# Patient Record
Sex: Male | Born: 1935 | Race: White | Hispanic: No | Marital: Married | Smoking: Former smoker
Health system: Southern US, Community
[De-identification: ages and names within clinical notes are randomized; demographics above are authoritative.]

## PROBLEM LIST (undated history)

## (undated) DIAGNOSIS — Z8546 Personal history of malignant neoplasm of prostate: Secondary | ICD-10-CM

## (undated) DIAGNOSIS — Z9989 Dependence on other enabling machines and devices: Secondary | ICD-10-CM

## (undated) DIAGNOSIS — I4819 Other persistent atrial fibrillation: Secondary | ICD-10-CM

## (undated) DIAGNOSIS — Z8711 Personal history of peptic ulcer disease: Secondary | ICD-10-CM

## (undated) DIAGNOSIS — I119 Hypertensive heart disease without heart failure: Secondary | ICD-10-CM

## (undated) DIAGNOSIS — E669 Obesity, unspecified: Secondary | ICD-10-CM

## (undated) DIAGNOSIS — K219 Gastro-esophageal reflux disease without esophagitis: Secondary | ICD-10-CM

## (undated) DIAGNOSIS — N183 Chronic kidney disease, stage 3 unspecified: Secondary | ICD-10-CM

## (undated) DIAGNOSIS — Z86718 Personal history of other venous thrombosis and embolism: Secondary | ICD-10-CM

## (undated) DIAGNOSIS — Z87442 Personal history of urinary calculi: Secondary | ICD-10-CM

## (undated) DIAGNOSIS — I350 Nonrheumatic aortic (valve) stenosis: Secondary | ICD-10-CM

## (undated) DIAGNOSIS — I2510 Atherosclerotic heart disease of native coronary artery without angina pectoris: Secondary | ICD-10-CM

## (undated) DIAGNOSIS — M1990 Unspecified osteoarthritis, unspecified site: Secondary | ICD-10-CM

## (undated) DIAGNOSIS — R32 Unspecified urinary incontinence: Secondary | ICD-10-CM

## (undated) DIAGNOSIS — Z950 Presence of cardiac pacemaker: Secondary | ICD-10-CM

## (undated) DIAGNOSIS — E782 Mixed hyperlipidemia: Secondary | ICD-10-CM

## (undated) DIAGNOSIS — J4540 Moderate persistent asthma, uncomplicated: Secondary | ICD-10-CM

## (undated) DIAGNOSIS — Z7901 Long term (current) use of anticoagulants: Secondary | ICD-10-CM

## (undated) DIAGNOSIS — G4733 Obstructive sleep apnea (adult) (pediatric): Secondary | ICD-10-CM

## (undated) DIAGNOSIS — C61 Malignant neoplasm of prostate: Secondary | ICD-10-CM

## (undated) DIAGNOSIS — T8450XA Infection and inflammatory reaction due to unspecified internal joint prosthesis, initial encounter: Secondary | ICD-10-CM

## (undated) HISTORY — DX: Long term (current) use of anticoagulants: Z79.01

## (undated) HISTORY — DX: Infection and inflammatory reaction due to unspecified internal joint prosthesis, initial encounter: T84.50XA

## (undated) HISTORY — DX: Atherosclerotic heart disease of native coronary artery without angina pectoris: I25.10

## (undated) HISTORY — DX: Presence of cardiac pacemaker: Z95.0

## (undated) HISTORY — DX: Hypertensive heart disease without heart failure: I11.9

## (undated) HISTORY — PX: SKIN BIOPSY: SHX1

## (undated) HISTORY — PX: INGUINAL HERNIA REPAIR: SUR1180

## (undated) HISTORY — PX: VENA CAVA FILTER PLACEMENT: SUR1032

## (undated) HISTORY — DX: Personal history of other venous thrombosis and embolism: Z86.718

## (undated) HISTORY — DX: Nonrheumatic aortic (valve) stenosis: I35.0

## (undated) HISTORY — PX: CATARACT EXTRACTION W/ INTRAOCULAR LENS  IMPLANT, BILATERAL: SHX1307

## (undated) HISTORY — PX: EXCISIONAL HEMORRHOIDECTOMY: SHX1541

## (undated) HISTORY — PX: APPENDECTOMY: SHX54

## (undated) HISTORY — PX: JOINT REPLACEMENT: SHX530

## (undated) HISTORY — DX: Malignant neoplasm of prostate: C61

## (undated) HISTORY — DX: Obesity, unspecified: E66.9

## (undated) HISTORY — PX: REPLACEMENT TOTAL KNEE: SUR1224

## (undated) HISTORY — PX: OTHER SURGICAL HISTORY: SHX169

## (undated) HISTORY — DX: Moderate persistent asthma, uncomplicated: J45.40

## (undated) HISTORY — DX: Gastro-esophageal reflux disease without esophagitis: K21.9

## (undated) HISTORY — DX: Mixed hyperlipidemia: E78.2

## (undated) HISTORY — DX: Personal history of malignant neoplasm of prostate: Z85.46

---

## 1998-11-22 ENCOUNTER — Encounter: Admission: RE | Admit: 1998-11-22 | Discharge: 1998-11-22 | Payer: Self-pay | Admitting: Specialist

## 2000-06-04 ENCOUNTER — Encounter: Admission: RE | Admit: 2000-06-04 | Discharge: 2000-06-04 | Payer: Self-pay | Admitting: Internal Medicine

## 2000-06-04 ENCOUNTER — Encounter: Payer: Self-pay | Admitting: Internal Medicine

## 2000-07-30 ENCOUNTER — Encounter (INDEPENDENT_AMBULATORY_CARE_PROVIDER_SITE_OTHER): Payer: Self-pay

## 2000-07-30 ENCOUNTER — Ambulatory Visit (HOSPITAL_COMMUNITY): Admission: RE | Admit: 2000-07-30 | Discharge: 2000-07-30 | Payer: Self-pay | Admitting: *Deleted

## 2003-11-06 ENCOUNTER — Ambulatory Visit (HOSPITAL_COMMUNITY): Admission: RE | Admit: 2003-11-06 | Discharge: 2003-11-06 | Payer: Self-pay | Admitting: Family Medicine

## 2004-04-25 ENCOUNTER — Encounter (INDEPENDENT_AMBULATORY_CARE_PROVIDER_SITE_OTHER): Payer: Self-pay | Admitting: *Deleted

## 2004-04-25 ENCOUNTER — Inpatient Hospital Stay (HOSPITAL_COMMUNITY): Admission: RE | Admit: 2004-04-25 | Discharge: 2004-04-29 | Payer: Self-pay | Admitting: Urology

## 2004-04-25 HISTORY — PX: PROSTATECTOMY: SHX69

## 2004-05-04 ENCOUNTER — Ambulatory Visit (HOSPITAL_COMMUNITY): Admission: RE | Admit: 2004-05-04 | Discharge: 2004-05-04 | Payer: Self-pay | Admitting: Urology

## 2004-05-04 ENCOUNTER — Inpatient Hospital Stay (HOSPITAL_COMMUNITY): Admission: EM | Admit: 2004-05-04 | Discharge: 2004-05-07 | Payer: Self-pay | Admitting: Emergency Medicine

## 2005-06-12 ENCOUNTER — Inpatient Hospital Stay (HOSPITAL_COMMUNITY): Admission: RE | Admit: 2005-06-12 | Discharge: 2005-06-15 | Payer: Self-pay | Admitting: Orthopedic Surgery

## 2005-11-06 ENCOUNTER — Ambulatory Visit (HOSPITAL_COMMUNITY): Admission: RE | Admit: 2005-11-06 | Discharge: 2005-11-07 | Payer: Self-pay | Admitting: Urology

## 2007-03-18 ENCOUNTER — Inpatient Hospital Stay (HOSPITAL_COMMUNITY): Admission: RE | Admit: 2007-03-18 | Discharge: 2007-03-21 | Payer: Self-pay | Admitting: Orthopedic Surgery

## 2009-02-08 ENCOUNTER — Encounter: Admission: RE | Admit: 2009-02-08 | Discharge: 2009-02-08 | Payer: Self-pay | Admitting: General Surgery

## 2009-02-11 ENCOUNTER — Ambulatory Visit (HOSPITAL_BASED_OUTPATIENT_CLINIC_OR_DEPARTMENT_OTHER): Admission: RE | Admit: 2009-02-11 | Discharge: 2009-02-11 | Payer: Self-pay | Admitting: General Surgery

## 2009-02-16 ENCOUNTER — Encounter (INDEPENDENT_AMBULATORY_CARE_PROVIDER_SITE_OTHER): Payer: Self-pay | Admitting: Urology

## 2009-02-16 ENCOUNTER — Ambulatory Visit: Payer: Self-pay | Admitting: Surgery

## 2009-02-16 ENCOUNTER — Ambulatory Visit: Admission: RE | Admit: 2009-02-16 | Discharge: 2009-02-16 | Payer: Self-pay | Admitting: Urology

## 2009-08-23 ENCOUNTER — Ambulatory Visit (HOSPITAL_BASED_OUTPATIENT_CLINIC_OR_DEPARTMENT_OTHER): Admission: RE | Admit: 2009-08-23 | Discharge: 2009-08-23 | Payer: Self-pay | Admitting: Urology

## 2009-08-25 ENCOUNTER — Ambulatory Visit: Payer: Self-pay | Admitting: Cardiology

## 2009-08-25 HISTORY — PX: CORONARY ANGIOPLASTY WITH STENT PLACEMENT: SHX49

## 2009-08-26 ENCOUNTER — Encounter: Payer: Self-pay | Admitting: Cardiology

## 2009-09-16 ENCOUNTER — Telehealth: Payer: Self-pay | Admitting: Cardiology

## 2009-09-18 ENCOUNTER — Encounter: Payer: Self-pay | Admitting: Cardiology

## 2009-09-21 ENCOUNTER — Encounter: Payer: Self-pay | Admitting: Cardiology

## 2009-09-24 DIAGNOSIS — Z8546 Personal history of malignant neoplasm of prostate: Secondary | ICD-10-CM

## 2009-09-24 DIAGNOSIS — Z86718 Personal history of other venous thrombosis and embolism: Secondary | ICD-10-CM

## 2009-09-24 DIAGNOSIS — K219 Gastro-esophageal reflux disease without esophagitis: Secondary | ICD-10-CM

## 2009-09-24 DIAGNOSIS — I119 Hypertensive heart disease without heart failure: Secondary | ICD-10-CM

## 2009-09-24 HISTORY — DX: Gastro-esophageal reflux disease without esophagitis: K21.9

## 2009-09-28 ENCOUNTER — Ambulatory Visit: Payer: Self-pay | Admitting: Cardiovascular Disease

## 2009-09-28 ENCOUNTER — Encounter: Payer: Self-pay | Admitting: Physician Assistant

## 2009-09-28 DIAGNOSIS — R42 Dizziness and giddiness: Secondary | ICD-10-CM | POA: Insufficient documentation

## 2009-10-25 ENCOUNTER — Ambulatory Visit: Payer: Self-pay | Admitting: Cardiology

## 2009-11-03 ENCOUNTER — Encounter: Payer: Self-pay | Admitting: Cardiology

## 2009-12-20 ENCOUNTER — Ambulatory Visit: Payer: Self-pay | Admitting: Cardiology

## 2009-12-20 LAB — CONVERTED CEMR LAB
ALT: 9 units/L (ref 0–53)
Albumin: 3.7 g/dL (ref 3.5–5.2)
Alkaline Phosphatase: 56 units/L (ref 39–117)
Bilirubin, Direct: 0.2 mg/dL (ref 0.0–0.3)
HDL: 30.1 mg/dL — ABNORMAL LOW (ref >39.00)
Total Bilirubin: 0.7 mg/dL (ref 0.3–1.2)
Total CHOL/HDL Ratio: 4
Total Protein: 6.1 g/dL (ref 6.0–8.3)
Triglycerides: 101 mg/dL (ref 0.0–149.0)
VLDL: 20.2 mg/dL (ref 0.0–40.0)

## 2009-12-27 ENCOUNTER — Encounter: Payer: Self-pay | Admitting: Cardiology

## 2010-01-13 ENCOUNTER — Inpatient Hospital Stay (HOSPITAL_COMMUNITY): Admission: EM | Admit: 2010-01-13 | Discharge: 2009-08-28 | Payer: Self-pay | Admitting: Emergency Medicine

## 2010-02-24 ENCOUNTER — Ambulatory Visit
Admission: RE | Admit: 2010-02-24 | Discharge: 2010-02-24 | Payer: Self-pay | Source: Home / Self Care | Attending: Cardiology | Admitting: Cardiology

## 2010-02-24 ENCOUNTER — Other Ambulatory Visit: Payer: Self-pay | Admitting: Cardiology

## 2010-02-24 DIAGNOSIS — E782 Mixed hyperlipidemia: Secondary | ICD-10-CM | POA: Insufficient documentation

## 2010-02-24 LAB — LIPID PANEL
Cholesterol: 103 mg/dL (ref 0–200)
HDL: 27.8 mg/dL — ABNORMAL LOW (ref >39.00)
LDL Cholesterol: 62 mg/dL (ref 0–99)
Total CHOL/HDL Ratio: 4
Triglycerides: 68 mg/dL (ref 0.0–149.0)
VLDL: 13.6 mg/dL (ref 0.0–40.0)

## 2010-02-24 LAB — HEPATIC FUNCTION PANEL
ALT: 10 U/L (ref 0–53)
AST: 21 U/L (ref 0–37)
Albumin: 3.8 g/dL (ref 3.5–5.2)
Alkaline Phosphatase: 51 U/L (ref 39–117)
Bilirubin, Direct: 0.2 mg/dL (ref 0.0–0.3)
Total Bilirubin: 0.7 mg/dL (ref 0.3–1.2)
Total Protein: 6.4 g/dL (ref 6.0–8.3)

## 2010-03-08 NOTE — Assessment & Plan Note (Signed)
Summary: EPH/ GD   Visit Type:  Follow-up  CC:  no complaints.  History of Present Illness: at this is a 77 year old white male patient who was admitted to the hospital with a non-ST elevation MI August 26, 2009 treated with drug-eluting stent to the mid circumflex. Ejection fraction was 50% with mild inferior hypokinesis.  The patient has done well since his MI. He has lost 20 pounds and is exercising at the [facility].He also walks half a mile twice a day. He is watching his diet closely. He does complain of dizziness if he bends over and stands up quickly. He does not get dizzy when he gets up from a lying or sitting position. He has stopped his Benicar on his own and his metoprolol was decreased to once daily by our office. He says this is better since we decreased it to once a day and he is more careful about ending over and standing up quickly.  Problems Prior to Update: 1)  Dizziness  (ICD-780.4) 2)  Cad, Native Vessel  (ICD-414.01) 3)  Dvt  (ICD-453.40) 4)  Hypertension  (ICD-401.9) 5)  Gerd  (ICD-530.81) 6)  Prostate Cancer  (ICD-185)  Current Medications (verified): 1)  Nexium 40 Mg Cpdr (Esomeprazole Magnesium) .... Take One Daily 2)  Plavix 75 Mg Tabs (Clopidogrel Bisulfate) .... Take One Daily 3)  Metoprolol Tartrate 25 Mg Tabs (Metoprolol Tartrate) .... Take One in The Evening 4)  Aspirin Ec 325 Mg Tbec (Aspirin) .... Take One Tablet By Mouth Daily 5)  Fish Oil Double Strength 1200 Mg Caps (Omega-3 Fatty Acids) .... Take Two Each Day 6)  Glucosamine2000 Mg .... One Each Day  Allergies (verified): 1)  ! * Amblex  Past History:  Past Medical History: Last updated: 09/24/2009  Prostate cancer, gastroesophageal reflux disease,   hypertension, DVT, status post Greenfield filter placement.      Past Surgical History: Last updated: 09/24/2009   Prostatectomy, urinary sphincters with multiple   revisions, total left knee replacement, left inguinal hernia.      Review of  Systems       see history of present illness  Vital Signs:  Patient profile:   77 year old male Height:      73 inches Weight:      235 pounds BMI:     31.12 Pulse rate:   60 / minute Pulse (ortho):   58 / minute Pulse rhythm:   regular BP sitting:   120 / 82  (left arm) BP standing:   143 / 85  Serial Vital Signs/Assessments:  Time      Position  BP       Pulse  Resp  Temp     By 12:18 PM  Lying LA  140/81   55                    Concetta Pfohl, CMA 12:18 PM  Sitting   128/78   58                    Concetta Pfohl, CMA 12:18 PM  Standing  143/85   58                    Concetta Pfohl, CMA   Physical Exam  General:   Well-nournished, in no acute distress. Neck: No JVD, HJR, Bruit, or thyroid enlargement Lungs: No tachypnea, clear without wheezing, rales, or rhonchi Cardiovascular: RRR, PMI not displaced, heart sounds normal, no murmurs,  gallops, bruit, thrill, or heave. Abdomen: BS normal. Soft without organomegaly, masses, lesions or tenderness. Extremities: right groin without hematoma or hemorrhage,without cyanosis, clubbing or edema. Good distal pulses bilateral SKin: Warm, no lesions or rashes  Musculoskeletal: No deformities Neuro: no focal signs    EKG  Procedure date:  09/28/2009  Findings:      sinus bradycardia with a first degree AV block  Impression & Recommendations:  Problem # 1:  CAD, NATIVE VESSEL (ICD-414.01) Psevered a non-ST elevation MI August 26, 2009 treated with drug-eluting stent to the circumflex. His mild inferior hypokinesis ejection fraction 50%. The patient is doing well exercising and has lost 20 pounds. He is having some dizziness and has first-degree AV block with sinus bradycardia on low-dose metoprolol. He is already stopped his Benicar. We will stop his metoprolol today. His updated medication list for this problem includes:    Plavix 75 Mg Tabs (Clopidogrel bisulfate) .Marland Kitchen... Take one daily    Metoprolol Tartrate 25 Mg Tabs  (Metoprolol tartrate) .Marland Kitchen... Take one in the evening    Aspirin Ec 325 Mg Tbec (Aspirin) .Marland Kitchen... Take one tablet by mouth daily  His updated medication list for this problem includes:    Plavix 75 Mg Tabs (Clopidogrel bisulfate) .Marland Kitchen... Take one daily    Metoprolol Tartrate 25 Mg Tabs (Metoprolol tartrate) .Marland Kitchen... Take one in the evening    Aspirin Ec 325 Mg Tbec (Aspirin) .Marland Kitchen... Take one tablet by mouth daily  Orders: EKG w/ Interpretation (93000)  Problem # 2:  DIZZINESS (ICD-780.4) Patient is not orthostatic. It may be becoming from the metoprolol and AV block. We will stop his metoprolol. Orders: EKG w/ Interpretation (93000)  Problem # 3:  HYPERTENSION (ICD-401.9) Patient's blood pressure is stable and actually on the low side when he checks it at home. The following medications were removed from the medication list:    Metoprolol Tartrate 25 Mg Tabs (Metoprolol tartrate) .Marland Kitchen... Take one in the evening His updated medication list for this problem includes:    Aspirin Ec 325 Mg Tbec (Aspirin) .Marland Kitchen... Take one tablet by mouth daily  Orders: EKG w/ Interpretation (93000)  Patient Instructions: 1)  Your physician recommends that you schedule a follow-up appointment  with Dr. Antoine Poche on Monday September 19,2011 at 11:45 am 2)  Your physician has recommended you make the following change in your medication: STOP Metoprolol

## 2010-03-08 NOTE — Letter (Signed)
Summary: Lipid/Liver [employer], Main Office  [address]   [place], [place] [address]   Phone: [phone]  Fax: [phone]         December 27, 2009 MRN: [id number]   [name] [address] [place], [place]  [address]   Dear Mr. Ganas,  Dr. Antoine Poche requested you have fasting lab work to check your lipid and liver (DX: 272.2, V58.69). Please have this blood work on  Thursday February 24, 2010.  It is important that patients and their doctor work together in the management/treatment of their health care.   Please bring this letter with you for your blood work.  Also, please remember not to eat or drink anything after midnight the night before.      Thank you,    Sander Nephew, RN for Dr. Rollene Rotunda [facility]

## 2010-03-08 NOTE — Letter (Signed)
Summary: Return To Work  [employer], Main Office  [address]   [place], [place] [address]   Phone: [phone]  Fax: [phone]    10/25/2009  TO: Leodis Sias IT MAY CONCERN   RE: JOMO FORAND [address] [place]   The above named individual is under my medical care and may return to work part time at the [employer] as of October 25, 2009  If you have any further questions or need additional information, please call.     Sincerely,    Dossie Arbour, RN, BSN for Dr. Rollene Rotunda

## 2010-03-08 NOTE — Miscellaneous (Signed)
Summary: RX for Fenofibrate  Clinical Lists Changes  Medications: Added new medication of FENOFIBRATE 54 MG TABS (FENOFIBRATE) one at bedtime - Signed Rx of FENOFIBRATE 54 MG TABS (FENOFIBRATE) one at bedtime;  #30 x 6;  Signed;  Entered by: Charolotte Capuchin, RN;  Authorized by: Rollene Rotunda, MD, [facility];  Method used: [facility]  [address] [id number] [address] [place], [place]  [address], Ph: [phone] or [phone], Fax: [phone]    Prescriptions: FENOFIBRATE 54 MG TABS (FENOFIBRATE) one at bedtime  #30 x 6   Entered by:   Charolotte Capuchin, RN   Authorized by:   Rollene Rotunda, MD, [facility]   Signed by:   Charolotte Capuchin, RN on 12/27/2009   Method used:   [facility]  [address] [id number]       [address]       [place], [place]  [address]       Ph: [phone] or [phone]       Fax: [phone]   RxID:   [id number]

## 2010-03-08 NOTE — Miscellaneous (Signed)
Summary: [facility] Progress Note   [facility]   Imported By: Roderic Ovens 09/29/2009 11:26:53  _____________________________________________________________________  External Attachment:    Type:   Image     Comment:   External Document

## 2010-03-08 NOTE — Miscellaneous (Signed)
Summary: [facility] Physician Order/Treatment Plan   [facility] Order/Treatment Plan   Imported By: Roderic Ovens 10/12/2009 11:40:10  _____________________________________________________________________  External Attachment:    Type:   Image     Comment:   External Document

## 2010-03-08 NOTE — Assessment & Plan Note (Signed)
Summary: f14m/dfg   Visit Type:  Follow-up  CC:  CAD.  History of Present Illness: The patient presents for followup of his known coronary disease. Since his angioplasty earlier this year he has done very well. In particular he has really worked hard at secondary risk reduction. He is exercising routinely and has lost 29 pounds. He denies any chest discomfort, neck or arm discomfort. He denies any palpitations, presyncope or syncope. He has no shortness of breath, PND or orthopnea.  He is having problems with urinary frequency and incontinence. He apparently has an artificial sphincter inserted. However, he is scheduled to go to [facility] to have further evaluation of this and it sounds like he will need further urologic procedures.   Current Medications (verified): 1)  Nexium 40 Mg Cpdr (Esomeprazole Magnesium) .... Take One Daily 2)  Plavix 75 Mg Tabs (Clopidogrel Bisulfate) .... Take One Daily 3)  Aspirin Ec 325 Mg Tbec (Aspirin) .... Take One Tablet By Mouth Daily 4)  Fish Oil Double Strength 1200 Mg Caps (Omega-3 Fatty Acids) .... Take Two Each Day 5)  Glucosamine2000 Mg .... One Each Day 6)  Multivitamins   Tabs (Multiple Vitamin) .Marland Kitchen.. 1 By Mouth Daily 7)  Pravastatin Sodium 20 Mg Tabs (Pravastatin Sodium) .... Take One Tablet By Mouth Daily At Bedtime  Allergies (verified): 1)  ! * Amblex  Past History:  Past Medical History:  Prostate cancer, gastroesophageal reflux disease,   hypertension, DVT, status post Greenfield filter placement,  PTCA and drug-eluting stent placement to the circumflex     Past Surgical History:  Prostatectomy, urinary sphincters with multiple   revisions, total left knee replacement, left inguinal hernia.      Review of Systems       As stated in the HPI and negative for all other systems.   Vital Signs:  Patient profile:   77 year old male Height:      73 inches Weight:      233 pounds BMI:     30.85 Pulse rate:   61 / minute Resp:     16  per minute BP sitting:   142 / 86  (right arm)  Vitals Entered By: Marrion Coy, CNA (October 25, 2009 11:46 AM)  Physical Exam  General:  Well developed, well nourished, in no acute distress. Head:  normocephalic and atraumatic Mouth:  Teeth, gums and palate normal. Oral mucosa normal. Neck:  Neck supple, no JVD. No masses, thyromegaly or abnormal cervical nodes. Chest Wall:  no deformities or breast masses noted Lungs:  Clear bilaterally to auscultation and percussion. Abdomen:  Bowel sounds positive; abdomen soft and non-tender without masses, organomegaly, or hernias noted. No hepatosplenomegaly. Msk:  Back normal, normal gait. Muscle strength and tone normal. Extremities:  No clubbing or cyanosis. Neurologic:  Alert and oriented x 3. Skin:  Intact without lesions or rashes. Cervical Nodes:  no significant adenopathy Inguinal Nodes:  no significant adenopathy Psych:  Normal affect.   Detailed Cardiovascular Exam  Neck    Carotids: Carotids full and equal bilaterally without bruits.      Neck Veins: Normal, no JVD.    Heart    Inspection: no deformities or lifts noted.      Palpation: normal PMI with no thrills palpable.      Auscultation: regular rate and rhythm, S1, S2 with soft apical systolic murmur  Vascular    Abdominal Aorta: no palpable masses, pulsations, or audible bruits.      Femoral Pulses: normal femoral  pulses bilaterally.      Pedal Pulses: normal pedal pulses bilaterally.      Radial Pulses: normal radial pulses bilaterally.      Peripheral Circulation: no clubbing, cyanosis, or edema noted with normal capillary refill.     Impression & Recommendations:  Problem # 1:  CAD, NATIVE VESSEL (ICD-414.01) I have spoken to the patient at length. It appears that he is urologic surgery cannot be deferred until July of next year which will be one year from the time of his stent. I spoke to Dr. Charlies Constable today. He will review his PCI notes to understand the  length and size of the drug-eluting stent is placed. We will then come up with recommendations concerning the risk of coming off of Plavix and possible bridging with a IIb IIIa inhibitor.  Problem # 2:  HYPERTENSION (ICD-401.9) His blood pressure is very slightly elevated today but he says it's well-controlled at home. I've asked him to bring in her blood pressure monitor to correlate with ours so we can understand accuracy.  Problem # 3:  Risk Reduction I have started him on pravastatin 2o mg daily the benefits described in the Heart Protection Study. I would check a liver and lipid profile in 8 weeks.  Patient Instructions: 1)  Your physician recommends that you schedule a follow-up appointment in: 6 months 2)  Your physician recommends that you return for a FASTING lipid profile and liver profile in 8 weeks--December 20, 2009 3)  Your physician has recommended you make the following change in your medication: Start Pravastatin 20 mg by mouth daily at bedtime. Prescriptions: PRAVASTATIN SODIUM 20 MG TABS (PRAVASTATIN SODIUM) Take one tablet by mouth daily at bedtime  #30 x 11   Entered by:   Dossie Arbour, RN, BSN   Authorized by:   Rollene Rotunda, MD, [facility]   Signed by:   Dossie Arbour, RN, BSN on 10/25/2009   Method used:   [facility]  [address] [id number]       [address]       [place], [place]  [address]       Ph: [phone] or [phone]       Fax: [phone]   RxID:   [id number]  I have reviewed and approved all prescriptions at the time of this visit. Rollene Rotunda, MD, [facility]  October 25, 2009 1:50 PM  Appended Document: f22m/dfg Discussed with Dr. Juanda Chance.  This was a 2.5 by 30 mm DES.  Given the long narrow nature of this Dr. Juanda Chance would suggest bridging with lovenox prior to surgery.  I will wait to hear back from the [facility] surgeons to discuss this further.

## 2010-03-08 NOTE — Letter (Signed)
Summary: [name] Clinic Note   [facility] Urology Clinic Note   Imported By: Roderic Ovens 11/17/2009 10:48:28  _____________________________________________________________________  External Attachment:    Type:   Image     Comment:   External Document

## 2010-03-08 NOTE — Progress Notes (Signed)
Summary: pt b/p problems   call back in am  Phone Note Call from Patient Call back at Home Phone [phone]   Caller: Patient Reason for Call: Talk to Nurse, Talk to Doctor Summary of Call: pt b/p is fluctuating last night it was 109/71 and he called night service and was told to take it again this am and it was 137/81 not sure what he needs to do was told maybe he needs a medication adjusment. He is going out to cut some grass so if he dosen't answer the phone when you call he will call back Initial call taken by: Omer Jack,  September 16, 2009 9:43 AM  Follow-up for Phone Call        left message for pt that blood pressures look good as long as he is not symptomatic.  Will call pt back again tomorrow. Follow-up by: Charolotte Capuchin, RN,  September 16, 2009 6:15 PM  Additional Follow-up for Phone Call Additional follow up Details #1::        145/95 took am medications then 107/65  107/70    having some dizziness and lightheadedness  Taking Metoprolol 50 mg once a day - had been on twice day called PA on call and was told to decrease to once a day, yesterday. Benicar 20mg  once a day - since discharge  pt will continue meds as ordered and make sure he is drinking plenty of fluids. He will call MD on call if he is symptomatic.   Additional Follow-up by: Charolotte Capuchin, RN,  September 17, 2009 5:56 PM

## 2010-03-10 ENCOUNTER — Telehealth: Payer: Self-pay | Admitting: Cardiology

## 2010-03-11 ENCOUNTER — Encounter: Payer: Self-pay | Admitting: Cardiology

## 2010-03-16 NOTE — Miscellaneous (Signed)
Summary: order for fenofibrate 160 and SL Ntg  Clinical Lists Changes  Medications: Changed medication from FENOFIBRATE 54 MG TABS (FENOFIBRATE) one at bedtime to FENOFIBRATE 160 MG TABS (FENOFIBRATE) one daily - Signed Added new medication of NITROSTAT 0.4 MG SUBL (NITROGLYCERIN) as needed for chest pain - Signed Rx of FENOFIBRATE 160 MG TABS (FENOFIBRATE) one daily;  #90 x 3;  Signed;  Entered by: Charolotte Capuchin, RN;  Authorized by: Rollene Rotunda, MD, [facility];  Method used: [facility]  [address] [id number] [address], [place], [place]  [address], Ph: [phone] or [phone], Fax: [phone] Rx of NITROSTAT 0.4 MG SUBL (NITROGLYCERIN) as needed for chest pain;  #25 x prn;  Signed;  Entered by: Charolotte Capuchin, RN;  Authorized by: Rollene Rotunda, MD, [facility];  Method used: [facility]  [address] [id number] [address] [place], [place]  [address], Ph: [phone] or [phone], Fax: [phone]    Prescriptions: NITROSTAT 0.4 MG SUBL (NITROGLYCERIN) as needed for chest pain  #25 x prn   Entered by:   Charolotte Capuchin, RN   Authorized by:   Rollene Rotunda, MD, [facility]   Signed by:   Charolotte Capuchin, RN on 03/11/2010   Method used:   [facility]        [facility]  [address] [id number]       [address]       [place], [place]  [address]       Ph: [phone] or [phone]       Fax: [phone]   RxID:   [id number] FENOFIBRATE 160 MG TABS (FENOFIBRATE) one daily  #90 x 3   Entered by:   Charolotte Capuchin, RN   Authorized by:   Rollene Rotunda, MD, [facility]   Signed by:   Charolotte Capuchin, RN on 03/11/2010   Method used:   [facility]ronically to        [facility]  [address] [id number]       [address]       [place], [place]  [address]       Ph: [phone] or [phone]       Fax: [phone]   RxID:   [id number]

## 2010-03-16 NOTE — Progress Notes (Signed)
Summary: lab results    Phone Note Call from Patient Call back at Home Phone [phone]   Caller: Patient Reason for Call: Talk to Nurse Summary of Call: pt calling re lab results.  Initial call taken by: Roe Coombs,  March 10, 2010 8:21 AM  Follow-up for Phone Call        See appended document.

## 2010-04-23 LAB — DIFFERENTIAL
Basophils Absolute: 0.1 10*3/uL (ref 0.0–0.1)
Basophils Relative: 1 % (ref 0–1)
Eosinophils Absolute: 0.3 10*3/uL (ref 0.0–0.7)
Eosinophils Relative: 5 % (ref 0–5)
Lymphocytes Relative: 23 % (ref 12–46)
Lymphs Abs: 1.6 10*3/uL (ref 0.7–4.0)
Monocytes Absolute: 0.6 10*3/uL (ref 0.1–1.0)
Monocytes Relative: 8 % (ref 3–12)
Neutro Abs: 4.4 10*3/uL (ref 1.7–7.7)

## 2010-04-23 LAB — BASIC METABOLIC PANEL
BUN: 23 mg/dL (ref 6–23)
BUN: 24 mg/dL — ABNORMAL HIGH (ref 6–23)
Calcium: 8.7 mg/dL (ref 8.4–10.5)
Calcium: 9.1 mg/dL (ref 8.4–10.5)
Chloride: 102 mEq/L (ref 96–112)
Chloride: 105 mEq/L (ref 96–112)
Chloride: 106 mEq/L (ref 96–112)
Creatinine, Ser: 1.18 mg/dL (ref 0.4–1.5)
Creatinine, Ser: 1.28 mg/dL (ref 0.4–1.5)
GFR calc Af Amer: >60 mL/min (ref >60)
Glucose, Bld: 161 mg/dL — ABNORMAL HIGH (ref 70–99)
Potassium: 3.9 mEq/L (ref 3.5–5.1)
Sodium: 135 mEq/L (ref 135–145)
Sodium: 136 mEq/L (ref 135–145)

## 2010-04-23 LAB — POCT I-STAT 4, (NA,K, GLUC, HGB,HCT)
Glucose, Bld: 112 mg/dL — ABNORMAL HIGH (ref 70–99)
HCT: 49 % (ref 39.0–52.0)
Hemoglobin: 16.7 g/dL (ref 13.0–17.0)
Potassium: 4.2 mEq/L (ref 3.5–5.1)
Sodium: 144 mEq/L (ref 135–145)

## 2010-04-23 LAB — PROTIME-INR
INR: 1.04 (ref 0.00–1.49)
Prothrombin Time: 13.5 seconds (ref 11.6–15.2)

## 2010-04-23 LAB — CARDIAC PANEL(CRET KIN+CKTOT+MB+TROPI)
CK, MB: 39.4 ng/mL (ref 0.3–4.0)
CK, MB: 71.3 ng/mL (ref 0.3–4.0)
Relative Index: 10.3 — ABNORMAL HIGH (ref 0.0–2.5)
Total CK: 690 U/L — ABNORMAL HIGH (ref 7–232)
Troponin I: 12.23 ng/mL (ref 0.00–0.06)
Troponin I: 28.2 ng/mL (ref 0.00–0.06)

## 2010-04-23 LAB — CBC
HCT: 43.5 % (ref 39.0–52.0)
Hemoglobin: 14.4 g/dL (ref 13.0–17.0)
Hemoglobin: 14.9 g/dL (ref 13.0–17.0)
MCHC: 34 g/dL (ref 30.0–36.0)
Platelets: 158 10*3/uL (ref 150–400)
Platelets: 185 10*3/uL (ref 150–400)
RBC: 4.61 MIL/uL (ref 4.22–5.81)
RBC: 4.95 MIL/uL (ref 4.22–5.81)
RBC: 4.96 MIL/uL (ref 4.22–5.81)
RDW: 14.7 % (ref 11.5–15.5)
RDW: 15.1 % (ref 11.5–15.5)
WBC: 6.1 10*3/uL (ref 4.0–10.5)
WBC: 7 10*3/uL (ref 4.0–10.5)
WBC: 7.4 10*3/uL (ref 4.0–10.5)

## 2010-04-23 LAB — POCT CARDIAC MARKERS
Myoglobin, poc: 412 ng/mL (ref 12–200)
Myoglobin, poc: >500 ng/mL (ref 12–200)
Troponin i, poc: 0.97 ng/mL (ref 0.00–0.09)
Troponin i, poc: 10.6 ng/mL (ref 0.00–0.09)

## 2010-04-23 LAB — CK TOTAL AND CKMB (NOT AT ARMC)
CK, MB: 68.1 ng/mL (ref 0.3–4.0)
Total CK: 713 U/L — ABNORMAL HIGH (ref 7–232)

## 2010-04-23 LAB — MRSA PCR SCREENING: MRSA by PCR: POSITIVE — AB

## 2010-04-23 LAB — HEPARIN LEVEL (UNFRACTIONATED): Heparin Unfractionated: 0.33 IU/mL (ref 0.30–0.70)

## 2010-04-23 LAB — TROPONIN I: Troponin I: 15.35 ng/mL (ref 0.00–0.06)

## 2010-04-23 LAB — APTT: aPTT: 26 seconds (ref 24–37)

## 2010-04-23 LAB — LIPID PANEL: Cholesterol: 118 mg/dL (ref 0–200)

## 2010-04-24 LAB — COMPREHENSIVE METABOLIC PANEL
Alkaline Phosphatase: 52 U/L (ref 39–117)
BUN: 18 mg/dL (ref 6–23)
Chloride: 107 mEq/L (ref 96–112)
Glucose, Bld: 96 mg/dL (ref 70–99)
Potassium: 4.3 mEq/L (ref 3.5–5.1)
Total Bilirubin: 1 mg/dL (ref 0.3–1.2)

## 2010-04-24 LAB — CBC
HCT: 48.3 % (ref 39.0–52.0)
Hemoglobin: 16.2 g/dL (ref 13.0–17.0)
WBC: 5.9 10*3/uL (ref 4.0–10.5)

## 2010-04-24 LAB — DIFFERENTIAL
Basophils Absolute: 0.1 10*3/uL (ref 0.0–0.1)
Basophils Relative: 1 % (ref 0–1)
Monocytes Absolute: 0.6 10*3/uL (ref 0.1–1.0)
Neutro Abs: 2.8 10*3/uL (ref 1.7–7.7)
Neutrophils Relative %: 48 % (ref 43–77)

## 2010-04-28 ENCOUNTER — Telehealth: Payer: Self-pay | Admitting: Cardiology

## 2010-04-28 NOTE — Telephone Encounter (Signed)
Pt is aware there are no special precautions that he needs to take during his vacation in [place].  I explained to pt he should avoid types of activities that could lead to heat exhaustion.  He states understanding and doesn't plan on any such activities.

## 2010-05-09 ENCOUNTER — Other Ambulatory Visit (INDEPENDENT_AMBULATORY_CARE_PROVIDER_SITE_OTHER): Payer: Federal, State, Local not specified - PPO | Admitting: *Deleted

## 2010-05-09 DIAGNOSIS — E7800 Pure hypercholesterolemia, unspecified: Secondary | ICD-10-CM

## 2010-05-09 DIAGNOSIS — Z79899 Other long term (current) drug therapy: Secondary | ICD-10-CM

## 2010-05-09 LAB — HEPATIC FUNCTION PANEL
ALT: 10 U/L (ref 0–53)
AST: 18 U/L (ref 0–37)
Alkaline Phosphatase: 40 U/L (ref 39–117)
Total Bilirubin: 0.7 mg/dL (ref 0.3–1.2)

## 2010-05-09 LAB — LIPID PANEL
HDL: 27.6 mg/dL — ABNORMAL LOW (ref >39.00)
Triglycerides: 83 mg/dL (ref 0.0–149.0)

## 2010-05-24 ENCOUNTER — Encounter: Payer: Self-pay | Admitting: *Deleted

## 2010-06-21 ENCOUNTER — Encounter: Payer: Self-pay | Admitting: Cardiology

## 2010-06-21 ENCOUNTER — Encounter: Payer: Self-pay | Admitting: *Deleted

## 2010-06-21 NOTE — Op Note (Signed)
[name], [name]               ACCOUNT NO.:  [id number]   MEDICAL RECORD NO.:  [id number]          PATIENT TYPE:  INP   LOCATION:  0008                         FACILITY:  [facility]   PHYSICIAN:  Ollen Gross, M.D.    DATE OF BIRTH:  1932/02/01   DATE OF PROCEDURE:  03/18/2007  DATE OF DISCHARGE:                               OPERATIVE REPORT   PREOPERATIVE DIAGNOSIS:  Osteoarthritis left knee.   POSTOPERATIVE DIAGNOSIS:  Osteoarthritis left knee.   PROCEDURE:  Left total knee arthroplasty.   SURGEON:  Ollen Gross, M.D.   ASSISTANT:  Avel Peace PA-C   ANESTHESIA:  Spinal.   ESTIMATED BLOOD LOSS:  Minimal.   DRAINS:  None.   TOURNIQUET TIME:  37 minutes 300 mmHg.   COMPLICATIONS:  None.   CONDITION:  Stable to recovery.   BRIEF CLINICAL NOTE:  [name] is a 77 year old male end-stage  arthritis of left knee with progressively worsening pain and  dysfunction.  Has had a successful right total knee arthroplasty and  presents now for left total knee arthroplasty.   PROCEDURE IN DETAIL:  After successful administration of spinal  anesthetic, a tourniquet is placed on the left thigh and left lower  extremity prepped and draped in the usual sterile fashion.  Extremity  was wrapped in Esmarch, knee flexed, tourniquet inflated to 300 mmHg.  Midline incision was made with 10 blade through subcutaneous tissue to  the level of the extensor mechanism.  A fresh blade is used to make a  medial parapatellar arthrotomy.  Soft tissue over the proximal medial  tibia subperiosteally elevated to the joint line with the knife and into  the semimembranosus bursa with a Cobb elevator.  Soft tissue laterally  is elevated with attention being paid to avoid patellar tendon on tibial  tubercle.  The patella subluxed laterally, knee flexed 90 degrees, ACL  and PCL removed.  Drill was used to create a starting hole in the distal  femur and the canal was thoroughly irrigated.  The 5  degrees left valgus  alignment guide is placed and referencing off the posterior condyles  rotations marked and the block pinned to remove 11 mm off the distal  femur.  I took 11 because of preop flexion contracture.  Distal femoral  resection is made with an oscillating saw.  Size 5 is the most  appropriate femoral size and then a size 5 cutting block is placed with  referencing rotation off the epicondylar axis.  The anterior, posterior  and chamfer cuts were then made.   Tibia subluxed forward and menisci removed.  Extramedullary tibial  alignment guide is placed referencing proximally at the medial aspect of  the tibial tubercle and distally along the second metatarsal axis and  tibial crest.  A block is pinned to remove about 2 mm from the more  deficient medial side.  Tibial resection is made with an oscillating  saw.  Size 4 is the most appropriate tibial component and the proximal  tibia is prepared with the modular drill and keel punch for a size 4.  Femoral preparation is  completed with intercondylar cut for size 5.   Size 4 mobile bearing tibial trial, size 5 posterior stabilized femoral  trial and a 10-mm posterior stabilized rotating platform insert trial  are placed.  With the 10 full extensions achieved and a tiny bit of the  AP play in extension and 90 degrees of flexion.  I went to 12.5 which  allowed for full extension with excellent varus, valgus, anterior and  posterior balance throughout full range of motion.  Patella was then  everted, thickness measured be 25 mm.  Freehand resection taken to 14  mm, 41 template is placed, lug holes were drilled, trial patella was  placed and it tracks normally.  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  The osteophytes  were then removed off the posterior femur with the femoral trial in  place.  All trials removed and the cut bone surfaces are prepared with  pulsatile lavage.   Cement was mixed and once ready for implantation the  size 4 mobile bearing tibial tray, size 5 posterior stabilized femur and  41 patella were cemented in place.  Patella is held the clamp.  Trial  12.5-mm inserts placed, knee held in full extension all extruded cement  removed.  When the cement was fully hardened, then the trials removed  and the wound copiously irrigated with saline solution.  The FloSeal  injected on the posterior capsule then the permanent 12.5 mm posterior  stabilized rotating platform insert is placed into the tibial tray.  FloSeal was placed in the medial gutter, suprapatellar area and lateral  gutter.  Moist sponge is placed and tourniquet released for total time  of 37 minutes.  Sponge is held for two minutes and then removed.  Minimal bleeding was encountered and that which is encountered was  stopped with electrocautery.  The wound is again irrigated and the  extensor mechanism closed with interrupted #1 PDS.  Flexion against  gravity to 140 degrees.  Subcu was closed with interrupted 2-0 Vicryl  subcuticular running 4-0 Monocryl.  Incisions cleaned and dried and  Steri-Strips and bulky sterile dressing applied.  He is then placed into  a knee immobilizer, awakened and transferred to recovery in stable  condition.      Ollen Gross, M.D.  Electronically Signed     FA/MEDQ  D:  03/18/2007  T:  03/19/2007  Job:  [id number]

## 2010-06-21 NOTE — Discharge Summary (Signed)
NAMEJOHNATON, SONNEBORN               ACCOUNT NO.:  [id number]   MEDICAL RECORD NO.:  [id number]          PATIENT TYPE:  INP   LOCATION:  1620                         FACILITY:  [facility]   PHYSICIAN:  Ollen Gross, M.D.    DATE OF BIRTH:  July 10, 1931   DATE OF ADMISSION:  03/18/2007  DATE OF DISCHARGE:  03/21/2007                               DISCHARGE SUMMARY   ADMITTING DIAGNOSES:  1. Osteoarthritis left knee infection.  2. Reflux disease.  3. History of prostate cancer status post surgery.  4. History of urinary incontinence.  5. History of deep vein thrombosis.  6. Post prostate surgery.   DISCHARGE DIAGNOSES:  1. Osteoarthritis left knee, status post left total knee replacement      arthroplasty.  2. Osteoarthritis left knee infection.  3. Reflux disease.  4. History of prostate cancer status post surgery.  5. History of urinary incontinence.  6. History of deep vein thrombosis.  7. Post prostate surgery.   PROCEDURE:  March 18, 2007 left total knee surgery, Dr. Lequita Halt.   ASSISTANT:  Alexzandrew L. Perkins, P.A.C.   ANESTHESIA:  Spinal.   CONSULTANTS:  Pre-op consult evaluation, Dr. Ihor Gully.   The patient was seen in the urology office by Dr. Charlann Boxer, preoperatively.  He has a known history of a artificial bladder sphincter surgery.  Dr.  Vernie Ammons was kind enough to see the patient the morning before surgery,  for Foley insertion.  The patient was instructed on to assist the nurses  and how to removed the Foley, later on in the postoperative course.   BRIEF HISTORY:  Ms. Kirchgessner is a 77 year old male with end-stage  arthritis of the left knee, progressive worsening pain and dysfunction,  assessed for a right total knee, now presents for left total knee.   LABORATORY DATA:  Preop CBC showed hemoglobin of 15.7, hematocrit 46,  white cell count 5.1, platelets 228.  Chem panel on admission:  Only  minimal elevated glucose of 116.  Remaining chem panel all within  normal  limits.  PT/INR pre-op 12.7 and 0.9, with a PTT of 32.  Pre-op UA was  negative.  Serial CBCs were followed.  Hemoglobin did drop down to 12.9,  last H&H 11.8 and 34.0.  Serial pro-times followed, last noted PT/INR  21.5 and 1.8.  Please note:  He was on Coumadin and Lovenox post-op.  Serial BMETs were followed.  Electrolytes remained within normal limits.  Post-op UA was taken, and he did show some moderate blood, positive  protein, otherwise negative.  Urine culture was pending at the time of  final culture, but there was no growth on the preliminary reading.   Chest x-ray:  March 08, 2007, no active disease.   EKG February 19, 2007.  Sinus rhythm with sinus arrhythmia, first-degree  AV block.  It was compared to the previous tracing of June 05, 2005,  confirmed by Dr. Orpah Cobb.   HOSPITAL COURSE:  The patient was admitted to [facility],  taken to the OR, underwent above-said procedure without complication.  The patient tolerated the procedure seizure well, later  transferred to  the recovery room and orthopedic floor, started on PCA and p.o.  analgesics for pain control.  He was given 24 hours of post-op IV  antibiotics in the form of Ancef.  He was started back on his home  medications, due to the history of DVT after previous prostate surgery.  We did put him on Lovenox bridge, until his Coumadin was therapeutic.  Had a history of urinary incontinence and with a history of the bladder  sphincter surgery.  We left his Foley in for 2 days.  He was initially  using his PCA and weaned him over to p.m. meds over the next 2 days.  Started getting up out of bed on day one, had a little bit of drop in  his sats and had increased his oxygen back up to 3 liters from 1 to 2  liters.  He did well that first night.  I think it was more of sedation  just from medications.  By day two, his O2 sats were doing much better,  and was able to wean off the O2, once we got rid of  his PCA.  Discontinue the fluids.  He had a little bit positive volume after  surgery, but he was diuresing his fluids very well, started getting up  and walking about 400 feet.  He was doing fantastic from a therapy  standpoint.  The Foley was removed later in the afternoon on post-op day  #2, by the nurses.  He started voiding on his own shortly after that by  using his release button from the surgery, although he did have a little  bit of leakage following voiding.  The patient was concerned about this.  I did speak with Dr. Isabel Caprice.  He was on call for urology, and it was  felt that the cuff may not have been re-activated for had not reinflated  completely.  The patient was okay and just wanted to wait and see with  Dr. Vernie Ammons the next morning.  He was seen on rounds on day three from  the orthopedic standpoint.  He was doing fantastic.  We placed a call  over to [facility].  Dr. Vernie Ammons was kind enough to come by in the  morning and reactivated his cuff.  Once that was done, the patient's  doing well, progressing with therapy, and was discharged home.   DISCHARGE/PLAN:  1. The patient is discharged home on March 21, 2007.  2. Discharge diagnoses, please see above.  3. He was given Percocet, Robaxin and Coumadin.  He will give be given      one more dose of Lovenox, prior to his discharge.   FOLLOWUP:  He is to follow up with Dr. Lequita Halt in the office, over at  the [place] office of [facility], call the  office for an appointment at [phone].  He will follow with Dr. Vernie Ammons,  as previously instructed and scheduled.   ACTIVITIES:  Weightbearing as tolerated left lower extremity, home  health PT and home health nursing, total knee protocol, weightbearing as  tolerated.   DISPOSITION:  Home.   CONDITION ON DISCHARGE:  Improved.      Alexzandrew L. Perkins, P.A.C.      Ollen Gross, M.D.  Electronically Signed    ALP/MEDQ  D:   03/21/2007  T:  03/22/2007  Job:  [id number]   cc:   Ollen Gross, M.D.  Fax: [phone]   Veverly Fells. Vernie Ammons, M.D.  Fax:  [phone]   Holley Bouche, M.D.  Fax: [phone]   Maretta Bees. Vonita Moss, M.D.  Fax: [phone]

## 2010-06-21 NOTE — H&P (Signed)
[name], [name]               ACCOUNT NO.:  [id number]   MEDICAL RECORD NO.:  [id number]          PATIENT TYPE:  INP   LOCATION:  0008                         FACILITY:  [facility]   PHYSICIAN:  Ollen Gross, M.D.    DATE OF BIRTH:  1931-08-21   DATE OF ADMISSION:  03/18/2007  DATE OF DISCHARGE:                              HISTORY & PHYSICAL   CHIEF COMPLAINT:  Left knee pain.   HISTORY OF PRESENT ILLNESS:  The patient is a 77 year old male who has  been seen by Dr. Lequita Halt for ongoing left knee pain.  He has previously  undergone a right total knee several years ago and doing well with that.  He is known to have end-stage arthritis of the left knee.  He has been  treated conservatively in the past.  He continues to have pain.  He has  reached the point where he would like to have something done about it.  He has been seen by Dr. Tiburcio Pea preoperatively and felt to be stable for  up and coming surgery.  Risks and benefits discussed.  The patient  subsequently admitted to the hospital.  The patient has already  undergone a sphincter surgery for urinary bladder.  Due to previous  surgery, he is having his Foley placed by Dr. Vernie Ammons preoperatively.  He will meet with him on the morning of the day of surgery and will get  recommendations from Dr. Vernie Ammons on the Foley catheter.   ALLERGIES:  NO KNOWN DRUG ALLERGIES.   INTOLERANCES:  ENABLEX causes dry mouth.   CURRENT MEDICATIONS:  1. Nexium.  2. Oxytrol.   PAST MEDICAL HISTORY:  1. Reflux.  2. History of prostate cancer.  3. Urinary incontinence.  4. Previous blood clot following prostate surgery.   PAST SURGICAL HISTORY:  1. Prostate surgery in March 2006.  2. Knee surgery in May 2007.  3. Urinary bladder sphincter surgery in September 2007.  4. TrapEase which is actually inferior vena cava filter placement by      Dr. Liliane Bade March 2006.   FAMILY HISTORY:  Father with history of cancer.  Brother with history of  cancer.   SOCIAL HISTORY:  Married, retired, past smoker, 3 children.  Family will  be assisting with care after surgery.   REVIEW OF SYSTEMS:  GENERAL:  No fevers, chills, night sweats.  NEURO:  He has a little bit of tinnitus ringing in his ears.  No seizures,  syncope or paralysis.  RESPIRATORY:  No shortness breath, productive  cough or hemoptysis.  CARDIOVASCULAR:  No chest pain, angina, orthopnea.  GI: No nausea, vomiting, diarrhea or constipation.  GU:  No dysuria,  hematuria, a little bit of incontinence.  MUSCULOSKELETAL:  Joint pain  and swelling.   PHYSICAL EXAMINATION:  VITAL SIGNS:  Pulse 72, respirations 14, blood  pressure 142/82.  GENERAL:  A 77 year old white male well-nourished, well-developed in no  acute distress.  He is alert, oriented and cooperative.  Slightly  overweight.  HEENT:  Normocephalic, atraumatic.  Pupils are round and reactive.  Oropharynx clear.  EOMs intact.  NECK:  Supple.  CHEST:  Clear.  HEART:  Regular rhythm with a faint grade 2/6 systolic ejection murmur  best heard over aortic, but also noted at pulmonic arch.  ABDOMEN:  Soft, round.  Bowel sounds are present.  RECTAL/BREASTS/GENITALIA:  Not done, not pertinent.  EXTREMITIES:  Left knee no effusion, varus malalignment deformity.  Range of motion 5-120, marked crepitus   IMPRESSION:  1. Osteoarthritis left knee.  2. Reflux disease.  3. History of prostate cancer.  4. Urinary incontinence.  5. History of deep venous thrombosis, post prostate surgery.   PLAN:  The patient is admitted to [facility] to undergo a left  total knee replacement and arthroplasty.  Surgery will be performed by  Dr. Ollen Gross.      Alexzandrew L. Perkins, P.A.C.      Ollen Gross, M.D.  Electronically Signed    ALP/MEDQ  D:  03/17/2007  T:  03/18/2007  Job:  [id number]   cc:   Ollen Gross, M.D.  Fax: [phone]   Holley Bouche, M.D.  Fax: [phone]   Maretta Bees. Vonita Moss, M.D.  Fax:  [phone]   Veverly Fells. Vernie Ammons, M.D.  Fax: [phone]

## 2010-06-24 NOTE — H&P (Signed)
NAMEYOSHIMI, [name]               ACCOUNT NO.:  [id number]   MEDICAL RECORD NO.:  [id number]          PATIENT TYPE:  INP   LOCATION:  NA                           FACILITY:  [facility]   PHYSICIAN:  Ollen Gross, M.D.    DATE OF BIRTH:  03-23-1931   DATE OF ADMISSION:  06/12/2005  DATE OF DISCHARGE:                                HISTORY & PHYSICAL   CHIEF COMPLAINT:  Right knee pain.   HISTORY OF PRESENT ILLNESS:  The patient is a 77 year old male who has been  seen by Dr. Homero Fellers Aluisio for ongoing right knee pain.  He was previously  patient of Dr. Thomasena Edis.  Knee has been bothering him for quite some time  now.  He has been treated conservatively in the past for right knee  arthritis and has undergone intra-articular cortisone injections.  He has  had recurrent effusions.  He has a significant history in regards to history  of blood clots.  He does have an IVC filter.  It is felt from an arthritic  standpoint he would benefit from undergoing surgery.  Risks and benefits of  this procedure have been discussed with the patient at length.  He has  elected to proceed with surgery.   ALLERGIES:  No known drug allergies.   CURRENT MEDICATIONS:  1.  Nexium daily.  2.  Hydrocodone p.r.n.   PAST MEDICAL HISTORY:  1.  Reflux disease.  2.  Prostate cancer.  3.  Urinary incontinence.  4.  History of deep vein thrombosis in the right leg after prostatectomy.   PAST SURGICAL HISTORY:  1.  Prostate surgery March 2006.  2.  Inferior vena cava filter placement per Dr. Madilyn Fireman.  3.  A node biopsy left chest which was benign.  4.  Appendectomy in 1955.   SOCIAL HISTORY:  Married, retired, nonsmoker, no alcohol, three children.  Wife will be assisting with care after surgery.   FAMILY HISTORY:  Father and brother with history of cancer.   REVIEW OF SYSTEMS:  GENERAL:  No fevers, chills or night sweats.  NEUROLOGICAL:  No seizures, syncope or paralysis.  RESPIRATORY:  No  shortness of  breath, productive cough or hemoptysis.  CARDIOVASCULAR:  No  chest pain, angina or orthopnea.  GI:  No nausea, vomiting, diarrhea or  constipation.  GU:  Weak stream, some frequency.  No dysuria, hematuria.  MUSCULOSKELETAL:  Right knee.   PHYSICAL EXAMINATION:  VITAL SIGNS:  Pulse 76, respirations 12, blood  pressure 128/78.  GENERAL:  A 77 year old white male well-developed, well-nourished in no  acute distress.  Alert, oriented, cooperative and very pleasant.  Excellent  historian.  HEENT:  Normocephalic and atraumatic.  Pupils are equal, round, and  reactive.  Oropharynx clear.  EOMs intact.  NECK:  Supple.  CHEST:  Clear.  HEART:  Regular rate and rhythm with a faint aortic systolic ejection murmur  noted.  ABDOMEN:  Soft and nontender.  Bowel sounds present.  RECTAL:  Not done, not pertinent to present illness.  BREASTS:  Not done, not pertinent to present illness.  GENITALIA:  Not done,  not pertinent to present illness.  EXTREMITIES:  Right knee shows moderate effusion, tender more medial and  lateral varus, now alignment deformity.  Range of motion 5 to 110 degrees.   IMPRESSION:  1.  Osteoarthritis right knee.  2.  Reflux disease.  3.  History of prostate cancer.  4.  Urinary incontinence.  5.  History of deep vein thrombosis right leg after previous prostatectomy.   PLAN:  The patient was admitted to [facility] to undergo a right  total knee arthroplasty.  Surgery will be performed by Dr. Ollen Gross.      Alexzandrew L. Julien Girt, P.A.      Ollen Gross, M.D.  Electronically Signed    ALP/MEDQ  D:  06/11/2005  T:  06/11/2005  Job:  [id number]   cc:   Ollen Gross, M.D.  Fax: [phone]   Maretta Bees. Vonita Moss, M.D.  Fax: [phone]   Tiburcio Pea, M.D.  [facility]

## 2010-06-24 NOTE — Op Note (Signed)
NAMEWILSON, DUSENBERY               ACCOUNT NO.:  [id number]   MEDICAL RECORD NO.:  [id number]          PATIENT TYPE:  INP   LOCATION:  0006                         FACILITY:  [facility]   PHYSICIAN:  Ollen Gross, M.D.    DATE OF BIRTH:  04-Nov-1931   DATE OF PROCEDURE:  06/12/2005  DATE OF DISCHARGE:                                 OPERATIVE REPORT   PREOPERATIVE DIAGNOSIS:  Osteoarthritis right knee.   POSTOPERATIVE DIAGNOSIS:  Osteoarthritis right knee.   PROCEDURE:  Right total knee arthroplasty.   SURGEON:  Ollen Gross, M.D.   ASSISTANT:  Alexzandrew L. Julien Girt, P.A.   ANESTHESIA:  Spinal.   ESTIMATED BLOOD LOSS:  Minimal.   DRAINS:  Hemovac x 1.   TOURNIQUET TIME:  44 minutes at 300 mmHg.   COMPLICATIONS:  None.  Condition stable to recovery.   BRIEF CLINICAL NOTE:  Mr. Forcier is a 77 year old male with severe end-  stage arthritis both knees, right more symptomatic than the left.  He has  failed nonoperative management and presents for total knee arthroplasty.   PROCEDURE IN DETAIL:  After successful initiation of spinal anesthetic,  tourniquet placed high on the right thigh and right lower extremity prepped  and draped in usual sterile fashion.  Extremity was wrapped in Esmarch, knee  flexed, tourniquet inflated 300 mmHg.  Midline incision made with 10 blade  through subcutaneous tissue to level the extensor mechanism.  Fresh blade is  used to make a medial parapatellar arthrotomy.  Soft tissue proximal medial  tibia subperiosteally elevated to the joint line with a knife into the  semimembranosus bursa with a Cobb elevator.  Soft tissue of the proximal  lateral tibia is also elevated with attention being paid to avoid the  patellar tendon on tibial tubercle.  Patella subluxed laterally, knee flexed  90 degrees, ACL, PCL removed.  Drill was used  to create starting hole on  the distal femur, canal was thoroughly irrigated.  Then 5 degrees right  valgus alignment  guide is placed referencing off the posterior condyles,  rotations marked and a block pinned to remove 10 mm of the distal femur.  Distal femoral resection made an oscillating saw.  Sizing blocks placed,  size 5 most appropriate.  Rotations marked at the epicondylar axis.  Size 5  cutting blocks placed and the anterior posterior chamfer cuts were made.   Tibia subluxed forward the menisci removed.  Extramedullary tibial alignment  guide is placed referencing proximally at medial aspect of tibial tubercle  and distally along the second metatarsal axis and tibial crest.  Blocks  pinned to remove 10 mm off the proximal tibia.  The resection does not get  to the bottom of the medial defects thus an additional 2 mm is resected.  Size 5 is most appropriate tibial component and the proximal tibia prepared  with modular drill and keel punch for a size 5.  Femoral preparation is  completed with the intercondylar cut.   Size 5 mobile bearing tibial trial with a size 5 posterior stabilized  femoral trial and a 12.5 mm posterior stabilized  rotating platform insert  trial placed.  With 12.5 full extension is achieved with excellent varus and  valgus balance throughout full range of motion.  Patella was everted,  thickness measured to 25 mm.  Freehand resection is taken to 14 mm, 41  template is placed, lug holes were drilled, trial patella was placed and it  tracks normally.  Osteophytes then removed off the posterior femur with the  trial placed.  All trials removed and the cut bone surfaces are prepared  with pulsatile lavage.  Cement mixed and once ready for implantation the  size 5 mobile bearing tibial tray size 5 posterior stabilized femur and 41  patella are cemented into place.  The patella was held the clamp.  Trial  12.5 mm inserts placed, knee held full extension and all extruded cement  removed.  Once cement fully hardened then the permanent 12.5 mm posterior  stabilized rotating platform  insert is placed in the tibial tray.  Wound  copiously irrigated saline solution and extensor mechanism closed over  Hemovac drain with interrupted #1 PDS.  Flexion against gravity 130 degrees.  Tourniquet released total time of 44 minutes.  Subcu closed interrupted 2-0  Vicryl, subcuticular running 4-0 Monocryl.  Incisions cleaned and dried and  Steri-Strips and bulky sterile dressing applied.  Drains hooked to suction,  is placed into a knee immobilizer.  I then consulted Dr. Larey Dresser  because of difficulty with the Foley catheter.  Mr. Coaxum is evaluated  by Dr. Vonita Moss and then after that is awakened, transferred to recovery in  stable condition.      Ollen Gross, M.D.  Electronically Signed     FA/MEDQ  D:  06/12/2005  T:  06/13/2005  Job:  [id number]

## 2010-06-24 NOTE — Op Note (Signed)
[name], [name]               ACCOUNT NO.:  [id number]   MEDICAL RECORD NO.:  [id number]          PATIENT TYPE:  INP   LOCATION:  0359                         FACILITY:  [facility]   PHYSICIAN:  Maretta Bees. Vonita Moss, M.D.DATE OF BIRTH:  15-Mar-1931   DATE OF PROCEDURE:  04/25/2004  DATE OF DISCHARGE:                                 OPERATIVE REPORT   PREOPERATIVE DIAGNOSIS:  Prostatic carcinoma.   POSTOPERATIVE DIAGNOSIS:  Prostatic carcinoma.   PROCEDURE:  Radical retropubic prostatectomy.   SURGEON:  Maretta Bees. Vonita Moss, M.D.   ASSISTANT:  Veverly Fells. Vernie Ammons, M.D.   ANESTHESIA:  General.   INDICATIONS:  This 77 year old gentleman has had a long history of bladder  outlet obstructive symptoms and was found to have a Gleason grade 6  carcinoma in the prostate and a very large prostate of 171 gm.  Details of  his counseling and indications for surgery are as noted in our H&P.   PROCEDURE:  The patient is brought to the operating room and placed in the  supine position.  A roll was placed under his hips, and he was put in flexed  extension to enhance surgical exposure.  The lower abdomen and external  genitalia were prepped and draped in the usual fashion.  A #24 French 30 cc  Foley was inserted into the bladder without difficulty.  A midline incision  was made vertically from the symphysis pubis up towards the umbilicus.  The  rectus fascia muscle was divided midline.  The retroperitoneal spaces were  divided out bilaterally.  The very large prostate gland was obviously  palpable.  The endopelvic fascia was exposed bilaterally and then incised  with the use of electrocautery.  The prostate, despite its large size, was  fortunately, thoroughly, easily mobilized posteriorly with digital  manipulation after opening up the endopelvic fascia.  Some small perforating  blood vessels are clipped with metal clips laterally to the urethra along  the pubic ramus.  The prostate was dissected down,  and fortunately, we were  able to identify and divide the puboprostatic ligaments at the symphysis  pubis.  A 2-0 chromic cat gut was used to suture over the top of the  prostate to prevent back-bleeding.  The dorsal vein complex was then  palpated, and this very large apex of the prostate and using the McDougal  clamp, I was able to isolate and put two heavy Vicryl sutures around the  dorsal vein complex.  With the McDougal clamp behind the dorsal vein  complex, I was then able to divide it and then over-sew some back-bleeders  at this point.  Later in the case, I did put a figure-of-eight suture in the  dorsal vein complex.  Using the Stamey retractor, I was able to visualize  the urethra, despite the large size of this prostate and use the Metzenbaum  scissors to try and separate the neurovascular bundles, but they were very  difficult to see and probably stretched out by this large prostate.  I put a  McDougal clamp behind the urethra and with an umbilical tape, and then  divided the anterior urethra and brought the Foley catheter tubing up into  the wound and divided the posterior urethra.  Fortunately, the prostate came  across the rectum with gentle digital palpation, and the prostatic pedicles  were taken down on each side using a series of Hem-o-Lok clips.  The  prostate was mobilized posteriorly with the capsule intact back to the  seminal vesicles.  At this point, the anterior bladder neck was opened, and  the patient was noted to have an extremely large median lobe projection into  the prostate.  Indigo carmine was seen to come from the ureteral orifices,  which were close to this median lobe, and sharp and blunt dissection was  used to divide the posterior bladder neck and allow removal of the median  lobe along the rest of the prostate.  At this point, the seminal vesicles  and ampulla of each vas are dissected out and divided with Hem-o-Lok clips.  The specimen removed and  sent for pathology.  This left [place] with a quite  large bladder neck defect with ureteral orifices close to the bladder neck  closure, which was then performed in an embrocating fashion with the aid of  ureteral catheters, and we actually went ahead and did an anastomosis from  the bladder to the urethra for a Foley catheter, but there was significant  leakage, and we felt the catheter posteriorly, so the anastomosis between  urethra and bladder neck was taken down, and there was a disruption of the  posterior bladder neck closure, which we then reclosed this using two layers  of 2-0 chromic cat gut with ureteral catheters again in place to verify that  the ureteral orifices were not included in this closure.  The bladder neck  mucosa was reapproximated at the bladder neck muscle with interrupted 4-0  chromic cat gut.  We then redid the bladder neck urethral anastomosis using  UR needles and placing 2-0 Vicryl at 2, 5, 7, 10, and 12 o'clock position.  The pelvis was very deep but felt like we got good sutures placed in the  membranous and periurethral tissues.  Then after tying down the anastomotic  sutures over a 22 French Foley and had 15 cc of water placement and balloon  irrigation, then had very little leakage.  It was felt that we could not  improve upon this result, and therefore a Blake drain was placed through a  stab wound to the left incision and used to drain the pre-vesicle space.  The wound was irrigated.  The rectus fascia was closed with running #1 PDS,  and the subcu was irrigated.  The skin was closed with skin staples.  The  Blake drain earlier was sewn in place with 2-0 black silk and connective  bulb suction.  Sponge, needle, and instrument count was correct.  Patient  remained stable throughout the procedure, and estimated blood loss was 1200-  1300 ml.  The wound was cleaned and dressed with dry sterile gauze dressings.  He was taken to the recovery room in good  condition, having  tolerated the procedure well.      LJP/MEDQ  D:  04/25/2004  T:  04/25/2004  Job:  [id number]

## 2010-06-24 NOTE — Op Note (Signed)
NAMENOTNAMED, SCHOLZ               ACCOUNT NO.:  [id number]   MEDICAL RECORD NO.:  [id number]          PATIENT TYPE:  INP   LOCATION:  4715                         FACILITY:  [facility]   PHYSICIAN:  Balinda Quails, M.D.    DATE OF BIRTH:  [birth year]   DATE OF PROCEDURE:  05/06/2004  DATE OF DISCHARGE:                                 OPERATIVE REPORT   PHYSICIAN:  Denman George, M.D.   DIAGNOSIS:  Deep venous thrombosis, right leg, with free-floating thrombus.   PROCEDURES:  1.  Inferior vena cavogram.  2.  Placement of infrarenal inferior vena cava TrapEase filter.   ACCESS:  Left common femoral vein 6-French sheath.   CONTRAST:  40 mL Visipaque.   COMPLICATIONS:  None apparent,   CLINICAL NOTE:  Mr. Borg is a 77 year old male status post radical  prostatectomy for prostate CA.  This was carried out April 28, 2004, by Dr.  Vonita Moss.  He re-presented with leg swelling.  Doppler evaluation revealed a  right posterior tibial-popliteal deep venous thrombosis with free-floating  thrombus extending into the distal right thigh.   The patient was seen in consultation.  He was on heparin intravenously at  that time.  It was recommended that he undergo placement of an inferior vena  cava filter due to the free-floating nature of the thrombus and his history  of prostate CA.  He consented.  Risks of the procedure including bleeding  and vessel occlusion were discussed with the patient.   PROCEDURE NOTE:  The patient brought to the catheterization lab in stable  condition.  Placed in supine position.  The left leg prepped and draped in a  sterile fashion.   Skin and subcutaneous tissue instilled with 1% Xylocaine.  The patient  administered 50 mcg of fentanyl and 2 mg of Versed intravenously.  The left  common femoral vein accessed with a needle.  A 0.035 Magic torque guidewire  advanced through the needle into the inferior vena cava at the midabdomen.  The needle removed.  A long  5-French sheath with injection port dilator was  inserted over the guidewire.  The guidewire removed.  Inferior vena cavogram  carried out with injection of 40 mL of contrast solution.  This clearly  delineated the origin of the left renal vein.   The sheath then advanced to appropriate position.  The dilator removed.  The  TrapEase filter advanced and deployed in the immediate infrarenal position.  There was minimal angulation.  Follow-up KUB revealed good positioning of  the filter at the junction of L2-L3.   The patient tolerated procedure well.  No apparent complications.  Left  femoral sheath removed.   FINAL IMPRESSION:  1.  Deep venous thrombosis, right leg, with free-floating thrombus.  2.  Successful deployment of TrapEase inferior vena cava filter in the      infrarenal position.   DISPOSITION:  1.  Bed rest for 2 hours.  2.  Ambulation to follow.  3.  To begin Lovenox 100 mg subcu twice daily  4.  Planned discharge tomorrow with Coumadin.  5.  Follow up with Dr. Tiburcio Pea on Tuesday, April 4, for protime check.      PGH/MEDQ  D:  05/06/2004  T:  05/06/2004  Job:  [id number]   cc:   Maretta Bees. Vonita Moss, M.D.  [address]  [place]  [place] [address]  Fax: [phone]   Holley Bouche, M.D.  [address]  [place], [place] [address]  Fax: [phone]   [facility]

## 2010-06-24 NOTE — Op Note (Signed)
[name], [name]               ACCOUNT NO.:  [id number]   MEDICAL RECORD NO.:  [id number]          PATIENT TYPE:  OIB   LOCATION:  1422                         FACILITY:  [facility]   PHYSICIAN:  Mark C. Vernie Ammons, M.D.  DATE OF BIRTH:  July 12, 1931   DATE OF PROCEDURE:  11/06/2005  DATE OF DISCHARGE:  11/07/2005                                 OPERATIVE REPORT   PREOPERATIVE DIAGNOSES:  1. Stress urinary continence.  2. Bladder neck contracture.   POSTOPERATIVE DIAGNOSES:  1. Stress urinary continence.  2. Bladder neck contracture.   PROCEDURES:  Cystoscopy, balloon dilatation of bladder neck contracture and  artificial urinary sphincter placement.   SURGEON:  Mark C. Vernie Ammons, M.D.   ASSISTANT:  Ebbie Ridge, M.D.   ANESTHESIA:  Spinal with conversion to general.   DRAINS:  None.   BLOOD LOSS:  50 mL.   SPECIMENS:  None.   COMPLICATIONS:  None.   DEVICE USED:  AMS800 artificial urinary sphincter with 61 70 cm water  reservoir and double 4 cm bulbar urethral cuff.   INDICATIONS:  The patient is a 77 year old white male who underwent a  radical prostatectomy in March 2006.  He had a 150 g prostate that made the  surgery somewhat more difficult and he developed incontinence afterward.  He  says he initially wore 10-15 pads in a 24-hour period and worked on Kegel  exercises with improvement down to wearing the 3-4 pads at night.  He  continued to have urinary incontinence.  I saw him and evaluated him  cystoscopically and found the urethra to appear normal with subjectively lax  sphincter and a bladder neck contracture that would not allow the 17-French  flexible scope beyond this.  Urodynamically, he was found to have a slightly  small capacity bladder to 75 mL with a leak point pressure of 158 cm water  when seated at 200 mL in his bladder.  The bladder was found to be stable.  We discussed the risks and complications as well as limitations of surgical  correction and  possible need for double cuffing and he has elected to  proceed with that.   DESCRIPTION OF OPERATION:  After informed consent, the patient brought to  the major OR and placed on the table, administered general anesthesia, then  moved to the dorsal lithotomy position.  His genitalia, lower abdomen and  perineum were scrubbed with Betadine for 5 minutes and then he underwent  Betadine prep as well as sterile drapes.  Initially a 21-French rigid  cystoscope was passed per urethra and down through the sphincter and the  bladder neck contracture was noted.  A 0.038 inch floppy tip guidewire was  then passed through the bladder neck contracture and the nephrostomy tract  dilating catheter was then passed over the guidewire across the bladder neck  and slowly inflated.  I then deflated this and removed the guidewire,  reinserting cystoscope.  It easily passed through the bladder neck with  minimal trauma or bleeding being noted.  The bladder itself was noted to be  free of any tumor, stones or  inflammatory lesions.  The cystoscope was  therefore removed and the 16-French Foley catheter was placed.   Attention was then directed to the perineum.  A midline incision was then  made in the perineum from below the scrotum to just above the anus and  carried down through the superficial tissue to expose the urethral bulb with  the bulbospongiosus muscle overlying this, which was incised in the midline  and I then dissected laterally on each side.  I was able to pass the right  angle clamp behind the urethra without difficulty.  There was a slight  amount of bleeding from a single artery that was identified and cauterized.  I then tested to assure patency of the urethra by partially removing the  Foley catheter to the mid urethra and instilling sterile saline through this  with no leakage through the perineal incision.  I then replaced the Foley  catheter and passed the cuff behind the urethra with the  use of a right  angle clamp.  This was performed after I incised the urethra and found the  correct size to be 4 cm, after which I chose a 4 cm cuff.  With the cuff in  place, the excess tablet to allow passage was excised and the button was  placed on the right lateral aspect of the urethra and the tubing tunneled  out through the bulbospongiosus muscle with a right angle clamp.   A midline incision was then made just above the symphysis pubis and carried  down through the subcutaneous tissue and rectus fascia parting the rectus  muscle bellies.  I incised the posterior rectus fascia and entered the space  of Retzius which was developed digitally after completely emptying the  bladder.  The reservoir was placed in this location after copiously  irrigating with antibiotic solution and filled with 23 mL of sterile saline.  I then closed the rectus fascia with running 2-0 Vicryl suture.   I then developed a pocket down through the suprapubic incision first  digitally lateral to the penis and down into the right hemiscrotum.  This  was further developed with a sponge forceps in the dependent anterior  portion of the right hemiscrotum and the pump was then passed down into the  right hemiscrotum and held in place with a Babcock.   The redundant tubing was excised after shodded hemostats were placed on the  tubing and the system was connected with the black stripe tubing from the  reservoir connecting to the black stripe tubing of the pump and the clear  tubing from the cuff to the clear tubing of the pump.  After performing  this, I tested the system by instilling 150 mL in his bladder through the  catheter and removing his catheter, allowing the device to fill, I noted  there was still fairly significant leakage of urine.  I therefore elected to  place a second cuff.   Attention was redirected to the perineum.  The incision was extended slightly toward the scrotum and the urethra was  exposed more distally.  I  left tissue in between the two cuffs and passed the second cuff in identical  fashion behind the urethra without difficulty again.  I then used a Y  connector to connect the two cuffs to the single tubing to the pump and then  pumped the fluid out of the cuff and deactivated this in the open position.  The perineal wound was then copiously irrigated with antibiotic solution  and  the bulbocavernosus muscles then reapproximated in the midline.  A second  layer of deep perineal tissue was then closed with running 3-0 chromic  suture and the superficial layer of perineal tissue was then closed with a  third layer of running 3-0 chromic.  I then closed the skin with running 3-0  chromic suture and applied Dermabond to the incision.  The pump was noted to  be in good position in the right hemiscrotum and remained deactivated state.  I then closed the suprapubic skin incision with staples and the Foley  catheter was removed.  The patient was awakened and taken to the recovery  room in stable satisfactory condition.  He tolerated the procedure well with  no intraoperative complications.  He will be observed overnight for  pain control and continued antibiotics with anticipation of discharge in the  morning.  Due to the significant amount of incontinence that I noted today  with the patient asleep, I have counseled him that he may still have some  degree of stress incontinence despite the double cuff sphincter.      Mark C. Vernie Ammons, M.D.  Electronically Signed     MCO/MEDQ  D:  11/06/2005  T:  11/07/2005  Job:  [id number]

## 2010-06-24 NOTE — Discharge Summary (Signed)
[name], [name]               ACCOUNT NO.:  [id number]   MEDICAL RECORD NO.:  [id number]          PATIENT TYPE:  INP   LOCATION:  0359                         FACILITY:  [facility]   PHYSICIAN:  Maretta Bees. Vonita Moss, M.D.DATE OF BIRTH:  [birth year]   DATE OF ADMISSION:  04/25/2004  DATE OF DISCHARGE:  04/29/2004                                 DISCHARGE SUMMARY   FINAL DIAGNOSES:  1.  Prostatic carcinoma.  2.  Bladder outlet obstructive symptoms.  3.  Gastroesophageal reflux disease.  4.  Osteoarthritis.   PROCEDURE:  Radical retropubic prostatectomy - April 25, 2004.   HISTORY:  This 77 year old gentleman has had a long history of bladder  outlet obstructive symptoms and has had PSA elevations in the past with  biopsies. In January, however, he was found to have a 171 g prostate on  ultrasound and his pathology showed a Gleason grade 6 (3+3) carcinoma  occupying 5% of the biopsy tissue on the left side and atypia on the right  side. He and his wife were counseled about therapy. In view of the large  prostate and bladder outlet obstructive symptoms it was felt that radical  prostatectomy was the procedure of choice. He is in generally good health  except for his dyspepsia due to GERD and some osteoarthritis. Physical exam  was unremarkable except for very large benign-feeling prostate.   HOSPITAL COURSE:  After admission, he underwent a radical retropubic  prostatectomy. His final pathology showed a 150 g prostate with carcinoma  confined to the prostate and negative margins and negative seminal vesicles.  His postoperative course was essentially benign. He passed flatus and  tolerated his diet well. He did have what seemed to be like a reaction with  chills due to p.o. Vicodin toward the end of his hospital stay. He also had  a few bladder spasms, but otherwise did well with his drain removed on April 28, 2004 after minimal postoperative drainage. He is ambulating well,  afebrile,  and ready for discharge on April 29, 2004. He will go home on diet  as tolerated and light activity for 6 weeks. He may shower, change bandage  as needed. He will use his leg bag in the day and a drainage bag at night.  He will be sent home on his usual medications of Nexium and diclofenac, and  he will stop his UroXatral and use Darvocet-N 100 for pain. He was sent home  in good condition. He will return to the office early next week in follow-  up.      LJP/MEDQ  D:  04/29/2004  T:  04/29/2004  Job:  [id number]

## 2010-06-24 NOTE — Discharge Summary (Signed)
NAMEKEGAN, MCKEITHAN               ACCOUNT NO.:  [id number]   MEDICAL RECORD NO.:  [id number]          PATIENT TYPE:  INP   LOCATION:  1509                         FACILITY:  [facility]   PHYSICIAN:  Ollen Gross, M.D.    DATE OF BIRTH:  1931/05/04   DATE OF ADMISSION:  06/12/2005  DATE OF DISCHARGE:  06/15/2005                                 DISCHARGE SUMMARY   ADMITTING DIAGNOSES:  1.  Osteoarthritis, right knee.  2.  Reflux disease.  3.  History of prostate cancer.  4.  Urinary incontinence.  5.  History of deep vein thrombosis, right leg, after a previous      prostatectomy.   DISCHARGE DIAGNOSES:  1.  Osteoarthritis, right knee, status post right total knee arthroplasty.  2.  Difficult Foley placement, status post Foley insertion by Dr. Vonita Moss.  3.  Reflux disease.  4.  History of prostate cancer.  5.  Urinary incontinence.  6.  History of deep vein thrombosis, right leg, after a previous      prostatectomy.  7.  Mild postoperative blood loss anemia.  8.  Bladder neck contracture, status post dilatation per cystoscopy.   PROCEDURE:  1.  Jun 12, 2005, right total knee surgery per Dr. Lequita Halt with assistant      Avel Peace, PA-C.  Anesthesia was spinal.  2.  Prior to the right total knee, the patient underwent cystoscopy and      dilatation of bladder neck contracture with placement of Foley catheter      per Dr. Vonita Moss.   MEDICATIONS AT DISCHARGE:  Same as admitting.   CONSULTATIONS:  Urology, Dr. Vonita Moss.   BRIEF HISTORY:  Mr. Bergeson is a 77 year old male with severe end-stage  arthritis of both knees, right worse than the medical left, failing  outpatient management and now presents for total knee arthroplasty.   LABORATORY DATA:  He had a preop CBC.  His hemoglobin was 15.0, hematocrit  of 44.8, white cell count 5.1, postop hemoglobin 13.1, last H&H 11.6 and  34.5,  PT and PTT preop 13.3 and 32 respectively, INR 1.0, the last PT-INR  __________ and 1.6.   Chem panel on admission all within normal limits.  Serial panels were followed.  The remainder of the electrolytes remained  within normal limits.  Preop UA negative.  Blood group type A positive.   EKG, June 05, 2005:  Sinus rhythm __________ first-degree AV block,  otherwise normal.  No significant change from the last tracing performed by  Dr. Olga Millers.   HOSPITAL COURSE:  The patient admitted to [facility] and underwent  cystoscopy and dilatation of bladder neck contracture and also the total  knee, tolerated well, later was recovering on the Orthopedic floor, started  on PCA analgesia for pain control following surgery, doing well on the  morning of day #1, had some CPM through the night, encouraged pills, did  have some urinary incontinence, left the Foley in place until Thursday for  Dr. Vonita Moss, Hemovac drain was pulled, did well on day #1, started getting  up out of bed with therapy.  By day #2, patient doing a little bit better,  Foley was in, for now will leave it in, dressing changed, incision looked  good, PCN IV fluids were discontinued, actually got up and ambulated 200  feet and later 250 feet that same day, started on Lovenox until the INR was  therapeutic.  By day #3, Jun 15, 2005, he was doing great, ready to go home,  not quite therapeutic, therefore, he is going to be sent home on Lovenox,  the Foley was removed, the catheter out and voiding okay, and was discharged  home.   DISCHARGE PLANNING:  1.  The patient was discharged home on Jun 15, 2005.  2.  For discharge diagnoses, please see above.  3.  Discharge meds:  Coumadin, Percocet, Robaxin, and Lovenox.  4.  Diet:  As tolerated.  5.  Activity:  Weightbearing as tolerated, right lower extremity, total knee      protocol.  6.  Home __________.  7.  Follow up two weeks.   DISPOSITION:  Home.   CONDITION ON DISCHARGE:  Improved.      Alexzandrew L. Julien Girt, P.A.      Ollen Gross,  M.D.  Electronically Signed    ALP/MEDQ  D:  08/08/2005  T:  08/08/2005  Job:  [id number]   cc:   Maretta Bees. Vonita Moss, M.D.  Fax: [phone]   Dr. Dahlia Bailiff

## 2010-06-24 NOTE — Procedures (Signed)
[facility]  Patient:    LADERRICK, WILK                        MRN: [id number] Proc. Date: 07/30/00 Attending:  Sabino Gasser, M.D.                           Procedure Report  PROCEDURE:  Colonoscopy.  INDICATION FOR PROCEDURE:  Colon polyps, colon cancer screening.  ANESTHESIA:  Demerol 70, Versed 8 mg.  DESCRIPTION OF PROCEDURE:  With the patient mildly sedated in the left lateral decubitus position, the Olympus videoscopic colonoscope was inserted in the rectum and passed under direct vision to the cecum identified by the ileocecal valve and appendiceal orifice both of which were photographed. From this point, the colonoscope was slowly withdrawn taking circumferential views of the entire colonic mucosa, stopping only in the rectum which appeared normal in direct and retroflexed view. The endoscope was straightened and withdrawn. The patients vital signs and pulse oximeter remained stable. The patient tolerated the procedure well without apparent complications.  FINDINGS:  Negative colonoscopic examination.  PLAN:  Repeat examination in five years. DD:  07/30/00 TD:  07/30/00 Job: [id number]

## 2010-06-24 NOTE — H&P (Signed)
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  He has had some bilateral  right greater than left ankle edema off and on for 2 days.  He took some  hydrochlorothiazide, but it did not help.  The right ankle got worse, and he  went and had a Doppler ultrasound of the lower extremities today, which  showed a free-floating mobile thrombus in the distal thigh on the right.  The thrombus was visualized in the right mid-calf at the PTV and extended up  through the tibioperoneal trunk.  He is admitted now for heparin IV therapy.  He has had no chest pain, no shortness of breath, and feels fine otherwise.  He has had no fever or chills.  Dr. Vonita Moss just saw him yesterday in the  office and removed his staples at that time.  He is due to have cystogram  next week.  The pathology report showed it was the [facility] Gleason 3+3  adenocarcinoma of the prostate.  There was no capsular extension, and the  margins were negative.  Seminal vesicles and nodes were also negative.   PAST MEDICAL HISTORY:   MEDICATIONS:  1.  Nexium.  2.  Occasional pain medicine.   ALLERGIES:  None.   OPERATIONS:  1.  T&A.  2.  Appendectomy.  3.  Benign axillary mass.  4.  Knee surgery.  5.  Hemorrhoidectomy.  (All in the past.)   REVIEW OF SYSTEMS:  He does have some GERD, some osteoarthritis.  No cardiac  or pulmonary symptomatology.  No GI complaints.  He does have some mild  constipation.  He does not smoke  or drink alcohol.   FAMILY HISTORY AND SOCIAL HISTORY:  Documented on previous admissions.  He  is married and does have a positive family history of carcinoma of the  prostate.   PHYSICAL EXAMINATION:  VITAL SIGNS:  Temperature is afebrile.  Blood  pressure 138/78, pulse 90, respirations 18.  GENERAL:  He is a healthy, pleasant white male in no acute distress.  HEENT:  Clear.  NECK:  Supple.  CHEST:  Clear.  No murmurs or rubs.  No inguinal, cervical, or axillary  adenopathy.  ABDOMEN:  A lower midline abdominal scar, well healed.  No tenderness.  GU:  Foley catheter is in place.  Bilaterally descended testes, empty  scrotum.  EXTREMITIES:  Right ankle edema, 2+, with positive Homans in the right calf  with some red streaks to the right ankle extending up to the mid-calf area.  He has fairly good distal pulses on both  sides.   IMPRESSION:  1.  Right thigh deep vein thrombosis.  2.  Recent prostatectomy.   RECOMMENDATIONS:  Monitored bed and IV heparin protocol.  Complete bed rest  until they clot has dissolved.  Will recheck in a day or two.  He does not  seem to have had any pulmonary emboli, as of yet.  Unless he has chest pain,  will hold off on a VQ scan.      HMK/MEDQ  D:  05/04/2004  T:  05/04/2004  Job:  [id number]   cc:   Holley Bouche, M.D.  [address]  [place], [place] [address]  Fax: [phone]   Maretta Bees. Vonita Moss, M.D.  [address]  [place]  [place] [address]  Fax: [phone]

## 2010-06-24 NOTE — Procedures (Signed)
[facility]  Patient:    [name], [name]                        MRN: [id number] Proc. Date: 07/30/00 Attending:  Sabino Gasser, M.D.                           Procedure Report  PROCEDURE:  Upper endoscopy.  INDICATIONS:  Gastroesophageal reflux disease.  ANESTHESIA:  Demerol 70 mg, Versed 8 mg.  DESCRIPTION OF PROCEDURE:  With the patient mildly sedated in the left lateral decubitus position, the Olympus videoscopic endoscope was inserted into the mouth and passed under direct vision through the esophagus.  The distal esophagus was approached and showed changes of esophagitis and/or Barretts, photographed and biopsied.  From this point, the endoscope was slowly passed further into the stomach.  The fundus, body, antrum, duodenal bulb, and second portion of the duodenum were well visualized and photographs taken.  From this point, the endoscope was slowly withdrawn, taking circumferential views of the entire duodenal mucosa until the endoscope then pulled back in the stomach and placed in retroflexion to view the stomach from below.  The endoscope was then straightened and withdrawn, taking circumferential views of the entire gastric and subsequently the esophageal mucosa which otherwise appeared normal.  The patients vital signs and pulse oximeter remained stable.  The patient tolerated the procedure well without apparent complications.  FINDINGS:  Esophagitis, rule out Barretts, biopsied.  PLAN:  Await biopsy report and will have patient follow up with me as an outpatient.  Proceed to colonoscopy. DD:  07/30/00 TD:  07/30/00 Job: [id number]

## 2010-06-24 NOTE — Consult Note (Signed)
[name], [name]               ACCOUNT NO.:  [id number]   MEDICAL RECORD NO.:  [id number]          PATIENT TYPE:  INP   LOCATION:  0006                         FACILITY:  [facility]   PHYSICIAN:  Maretta Bees. Vonita Moss, M.D.DATE OF BIRTH:  [birth year]   DATE OF CONSULTATION:  06/12/2005  DATE OF DISCHARGE:                                   CONSULTATION   INTRAOPERATIVE CONSULTATION/OPERATIVE NOTE   HISTORY:  I was asked to see this patient of mine who underwent radical  prostatectomy for prostatic carcinoma in March 2006.  He had a very large  prostate gland at that time that measured on ultrasound when he had his  biopsy at 171 mL.  He was undergoing knee surgery today and the OR staff did  not think the catheter was necessarily in the bladder and I was asked to see  him in consultation.   He has been undergoing physical therapy for some incontinence and that is  unfortunately somewhat expected with the large prostate gland that he had.   On examination, he was under spinal anesthesia but communicative.  The  suprapubic area was somewhat distended.  On examination of the penis,  urethral meatus, scrotum, testicles, and epididymides, a small Foley  catheter was per urethra but I felt that it was extending too far out to be  in the bladder.   PROCEDURE:  The patient was prepped and draped in the usual fashion, the  prior Foley catheter removed.  I then cystoscoped him and there was some  hemorrhage and mucosal disruption where I believe his Foley catheter balloon  had been inflated.  He had a very pinpoint urethral stricture.  Through the  cystoscope, a guidewire was inserted into the bladder through the pinpoint  bladder neck contracture.  I then dilated him from a 12 to a 76 [place] with  the [facility] dilators.  I think reinserted the cystoscope and the bladder had  some blood in it but I saw no obvious lesions, and with the guidewire still  in place I inserted an 18-French Councill-tip  catheter without difficulty.  The catheter was then connected to closed drainage and the patient take to  the recovery room in good condition, having tolerated the procedure well.      Maretta Bees. Vonita Moss, M.D.  Electronically Signed     LJP/MEDQ  D:  06/12/2005  T:  06/13/2005  Job:  [id number]

## 2010-06-24 NOTE — Discharge Summary (Signed)
[name], [name]               ACCOUNT NO.:  [id number]   MEDICAL RECORD NO.:  [id number]          PATIENT TYPE:  INP   LOCATION:  4715                         FACILITY:  [facility]   PHYSICIAN:  Maretta Bees. Vonita Moss, M.D.DATE OF BIRTH:  May 23, 1931   DATE OF ADMISSION:  05/04/2004  DATE OF DISCHARGE:  05/07/2004                                 DISCHARGE SUMMARY   HISTORY:  This 77 year old gentleman had a radical retropubic prostatectomy  on April 25, 2004, and was admitted because of lower extremity edema and was  found to have a DVT in his right thigh.  Physical examination was pertinent  for an edematous right leg, with a positive Homans sign.   HOSPITAL COURSE:  After admission, he was seen in consultation by Dr. Liliane Bade from vascular surgery, who recommended anticoagulation and also  inserted a vena cava filter.  He was voiding well and ready for discharge on  May 07, 2004.  He will return to the office to see me in followup in a  couple of weeks.  He was sent home in improved condition.  He will be on  limited activity and diet as tolerated.  He was sent home with his Foley  catheter in place and he will be sent home on Coumadin and use pain  medications as needed.  FINAL DIAGNOSES  DVT  PROSTATIC CARCINOMA           ______________________________  Maretta Bees. Vonita Moss, M.D.  Electronically Signed     LJP/MEDQ  D:  09/28/2004  T:  09/28/2004  Job:  [id number]

## 2010-06-24 NOTE — H&P (Signed)
NAMECHARLY, [name]               ACCOUNT NO.:  [id number]   MEDICAL RECORD NO.:  [id number]          PATIENT TYPE:  INP   LOCATION:  0359                         FACILITY:  [facility]   PHYSICIAN:  Maretta Bees. Vonita Moss, M.D.DATE OF BIRTH:  1932-01-09   DATE OF ADMISSION:  04/25/2004  DATE OF DISCHARGE:                                HISTORY & PHYSICAL   HISTORY:  This 77 year old gentleman has had a long history of bladder  outlet obstructive symptoms and also family history of prostate carcinoma.  He has been on Uroxatral for bladder outlet obstructive symptoms.  He has  also had a history of PSA elevations and had a biopsy in January that showed  a large prostate, 171 gm, and his pathology showed Gleason grade 6 (3+3) in  5% of the biopsies taken from the left side and focal atypia on the right  side.  He actually wanted his prostate out last year, just because he is  concerned about his family history and his voiding symptoms.  He was  counseled about therapeutic options, including radical prostatectomy and  risk of impotence, incontinence, rectal injury, or hemorrhage, and also was  counseled about radiation therapy.  He was admitted today for surgery.   PAST MEDICAL HISTORY:  Includes some dyspepsia and GERD.  He also has some  osteoarthritis.   PAST SURGICAL HISTORY:  Tonsillectomy, appendectomy, removal of benign  axillary mass, and knee surgeries and hemorrhoidectomy.   CURRENT MEDICATIONS:  Include Bayer aspirin, ferrous sulfate, Uroxatral,  fish oil, vitamin D, calcium, Nexium, diclofenac, Advil p.r.n.   ALLERGIES TO DRUGS:  None.   TOBACCO:  None.   ALCOHOL:  None.   FAMILY HISTORY:  Positive for prostate cancer.   REVIEW OF SYSTEMS:  As noted on the health history form.   PHYSICAL EXAMINATION:  VITAL SIGNS:  Blood pressure 140/80, pulse 79,  temperature 97.2.  GENERAL:  A heavy set white male who appears his stated age in no acute  distress.  NECK:  Supple.  CHEST:   Clear.  HEART:  Heart tones regular.  ABDOMEN:  Soft and nontender.  No hepatosplenomegaly, hernias, inguinal  nodes.  GENITOURINARY:  Penis, urethra, meatus, scrotum, testicles, and epididymis  are without lesions.  The perineum is normal.  The anal sphincter tone is  normal.  The prostate feels large and smooth and with no nodules.  No  seminal vesicle tissue palpated.   IMPRESSION:  1.  Prostatic carcinoma.  2.  Bladder outlet obstructive symptoms.  3.  Gastroesophageal reflux disease.  4.  Osteoarthritis.   PLAN:  Radical prostatectomy.      LJP/MEDQ  D:  04/25/2004  T:  04/25/2004  Job:  [id number]

## 2010-06-27 ENCOUNTER — Ambulatory Visit (INDEPENDENT_AMBULATORY_CARE_PROVIDER_SITE_OTHER): Payer: Federal, State, Local not specified - PPO | Admitting: Cardiology

## 2010-06-27 ENCOUNTER — Encounter: Payer: Self-pay | Admitting: Cardiology

## 2010-06-27 VITALS — BP 146/78 | HR 59 | Resp 14 | Ht 73.0 in | Wt 231.0 lb

## 2010-06-27 DIAGNOSIS — I2510 Atherosclerotic heart disease of native coronary artery without angina pectoris: Secondary | ICD-10-CM

## 2010-06-27 DIAGNOSIS — E782 Mixed hyperlipidemia: Secondary | ICD-10-CM

## 2010-06-27 DIAGNOSIS — I10 Essential (primary) hypertension: Secondary | ICD-10-CM

## 2010-06-27 NOTE — Assessment & Plan Note (Signed)
The patient has no new sypmtoms.  No further cardiovascular testing is indicated.  We will continue with aggressive risk reduction and meds as listed.  He can stop his Plavix 7 days before surgery after July.

## 2010-06-27 NOTE — Progress Notes (Signed)
HPI The patient presents for followup. He had stenting of his circumflex in July of last year. He was to have urologic surgery but deferred this as there was some issue with her whether he would be at low risk coming off his Plavix. It was an elective procedure and he decided to wait until the summer. He is to have this at [facility]. He has had no further chest pressure, neck or arm discomfort. He is exercising routinely. He denies any shortness of breath, PND or orthopnea. He has had no palpitations, presyncope or syncope.  Allergies  Allergen Reactions  . Enablex     Current Outpatient Prescriptions  Medication Sig Dispense Refill  . aspirin 325 MG tablet Take 325 mg by mouth daily.        . clopidogrel (PLAVIX) 75 MG tablet Take 75 mg by mouth daily.        . Cyanocobalamin (B-12) 1000 MCG CAPS 1 tab po qd       . Diphenhydramine-APAP, sleep, (EXCEDRIN PM PO) as needed.        Marland Kitchen esomeprazole (NEXIUM) 40 MG capsule Take 40 mg by mouth daily before breakfast.        . fenofibrate 160 MG tablet Take 160 mg by mouth daily.        . Misc Natural Products (GLUCOSAMINE CHONDROITIN ADV PO) 1 tab po qd       . Multiple Vitamin (MULTIVITAMIN) capsule Take 1 capsule by mouth daily.        . nitroGLYCERIN (NITROSTAT) 0.4 MG SL tablet Place 0.4 mg under the tongue every 5 (five) minutes as needed.        . Omega-3 Fatty Acids (FISH OIL CONCENTRATE PO) 1 tab po qd       . pravastatin (PRAVACHOL) 20 MG tablet Take 20 mg by mouth daily.          Past Medical History  Diagnosis Date  . HYPERTENSION   . CAD, NATIVE VESSEL     PCI and DES to circumflex  . DVT   . Mixed hyperlipidemia   . PROSTATE CANCER   . GERD     Past Surgical History  Procedure Date  . Prostatectomy   . Knee surgery   . Hernia repair     ROS:  As stated in the HPI and negative for all other systems.  PHYSICAL EXAM BP 146/78  Pulse 59  Resp 14  Ht 6\' 1"  (1.854 m)  Wt 231 lb (104.781 kg)  BMI 30.48 kg/m2 GENERAL:  Well  appearing HEENT:  Pupils equal round and reactive, fundi not visualized, oral mucosa unremarkable NECK:  No jugular venous distention, waveform within normal limits, carotid upstroke brisk and symmetric, no bruits, no thyromegaly LYMPHATICS:  No cervical, inguinal adenopathy LUNGS:  Clear to auscultation bilaterally BACK:  No CVA tenderness CHEST:  Unremarkable HEART:  PMI not displaced or sustained,S1 and S2 within normal limits, no S3, no S4, no clicks, no rubs, no murmurs ABD:  Flat, positive bowel sounds normal in frequency in pitch, no bruits, no rebound, no guarding, no midline pulsatile mass, no hepatomegaly, no splenomegaly, obese EXT:  2 plus pulses throughout, no edema, no cyanosis no clubbing SKIN:  No rashes no nodules NEURO:  Cranial nerves II through XII grossly intact, motor grossly intact throughout PSYCH:  Cognitively intact, oriented to person place and time   EKG:  Sinus rhythm, rate 59, axis within normal limits, intervals within normal limits except first degree AV block,  no acute ST-T wave changes.   ASSESSMENT AND PLAN

## 2010-06-27 NOTE — Patient Instructions (Signed)
Continue current medications Follow up with Dr Antoine Poche in 1 yr

## 2010-06-27 NOTE — Assessment & Plan Note (Signed)
The blood pressure is at target. No change in medications is indicated. We will continue with therapeutic lifestyle changes (TLC).  

## 2010-06-27 NOTE — Assessment & Plan Note (Signed)
His HDL remains low despite increased fenofibrate.  He will continue with the meds as listed.  I reviewed these values.

## 2010-08-17 ENCOUNTER — Other Ambulatory Visit: Payer: Self-pay | Admitting: *Deleted

## 2010-08-17 MED ORDER — CLOPIDOGREL BISULFATE 75 MG PO TABS: 75.0000 mg | ORAL_TABLET | Freq: Every day | ORAL | Status: DC

## 2010-09-12 ENCOUNTER — Telehealth: Payer: Self-pay | Admitting: Cardiology

## 2010-09-12 NOTE — Telephone Encounter (Signed)
Pt needs a letter of clearance for bladder surgery

## 2010-09-14 NOTE — Telephone Encounter (Signed)
I talked with pt. Pt has been seeing bladder doctor at [facility] about ongoing bladder problems.

## 2010-09-14 NOTE — Telephone Encounter (Signed)
Pt checking on status of letter for clearance for bladder surgery. Please fax letter to Dr. Lois Huxley at [facility] #[phone]  Also send a copy of letter to pt (via postal service) for pt medical records.

## 2010-09-14 NOTE — Telephone Encounter (Signed)
I talked with pt. Pt states he saw Dr Antoine Poche in May 2012 and was given the OK to hold Plavix for 7 days prior to anticipated bladder surgery in August. Pt understood that he could stop Plavix 7 days  prior to surgery and did not need to restart Plavix after surgery. He had stenting of his circumflex artery July 2011. Pt has an appt with Dr Gates Rigg, at  [facility] 09/27/10 for consultation. Surgery is not scheduled yet.  I told pt it is OK to hold Plavix 7 days prior to surgery based on Dr Hochrein's 06/27/10 note.  Pt would like to know if Dr Antoine Poche recommends he restart Plavix after surgery or if he can discontinue Plavix completely. I will forward to Dr Antoine Poche for review.

## 2010-09-19 NOTE — Telephone Encounter (Signed)
Left message for pt on private voice mail that he is approved to stop his Plavix completely per Dr Rollene Rotunda.

## 2010-09-19 NOTE — Telephone Encounter (Signed)
Ok to stop Plavix completely.

## 2010-09-20 ENCOUNTER — Encounter: Payer: Self-pay | Admitting: *Deleted

## 2010-09-20 ENCOUNTER — Telehealth: Payer: Self-pay | Admitting: Cardiology

## 2010-09-20 NOTE — Telephone Encounter (Signed)
Letter completed, faxed and mailed to pt.

## 2010-09-20 NOTE — Telephone Encounter (Signed)
Pt called and stated going to [facility] next week.  Please call him back regarding the message from yesterday. Call today if possible.

## 2010-10-13 ENCOUNTER — Other Ambulatory Visit: Payer: Self-pay | Admitting: *Deleted

## 2010-10-13 MED ORDER — PRAVASTATIN SODIUM 20 MG PO TABS: 20.0000 mg | ORAL_TABLET | Freq: Every day | ORAL | Status: DC

## 2010-10-27 LAB — URINE MICROSCOPIC-ADD ON

## 2010-10-27 LAB — URINALYSIS, ROUTINE W REFLEX MICROSCOPIC
Leukocytes, UA: NEGATIVE
Nitrite: NEGATIVE
Specific Gravity, Urine: 1.024
Urobilinogen, UA: 0.2
pH: 6

## 2010-10-27 LAB — CBC
MCV: 79.7
Platelets: 228
RDW: 17 — ABNORMAL HIGH
WBC: 5.1

## 2010-10-27 LAB — COMPREHENSIVE METABOLIC PANEL
AST: 25
Albumin: 3.6
Calcium: 9.1
Chloride: 110
Creatinine, Ser: 1.22
GFR calc Af Amer: >60
Total Protein: 6.7

## 2010-10-27 LAB — APTT: aPTT: 32

## 2010-10-27 LAB — PROTIME-INR: INR: 0.9

## 2010-10-28 LAB — CBC
HCT: 34 — ABNORMAL LOW
HCT: 39.9
Hemoglobin: 12.9 — ABNORMAL LOW
Hemoglobin: 13.5
MCHC: 33.9
MCHC: 34
Platelets: 165
RBC: 4.96
RDW: 17 — ABNORMAL HIGH
RDW: 17.3 — ABNORMAL HIGH
WBC: 7.3

## 2010-10-28 LAB — BASIC METABOLIC PANEL
BUN: 17
CO2: 25
CO2: 28
Calcium: 8.4
Calcium: 8.5
Creatinine, Ser: 1.35
GFR calc Af Amer: >60
GFR calc non Af Amer: 57 — ABNORMAL LOW
Glucose, Bld: 155 — ABNORMAL HIGH
Glucose, Bld: 163 — ABNORMAL HIGH
Potassium: 4.2
Sodium: 136
Sodium: 136

## 2010-10-28 LAB — URINALYSIS, ROUTINE W REFLEX MICROSCOPIC
Bilirubin Urine: NEGATIVE
Ketones, ur: NEGATIVE
Nitrite: NEGATIVE
pH: 5.5

## 2010-10-28 LAB — URINE CULTURE
Colony Count: NO GROWTH
Special Requests: NEGATIVE

## 2010-10-28 LAB — PROTIME-INR
INR: 1.8 — ABNORMAL HIGH
INR: 2.2 — ABNORMAL HIGH
Prothrombin Time: 21.5 — ABNORMAL HIGH

## 2011-01-16 ENCOUNTER — Telehealth: Payer: Self-pay | Admitting: Cardiology

## 2011-01-16 NOTE — Telephone Encounter (Signed)
Called stating he needs to know if should continue ASA 81 mg or 325 mg. Advised would be called back on Tuesday.

## 2011-01-16 NOTE — Telephone Encounter (Signed)
New Msg: Pt calling wanting to know if Dr. Antoine Poche wants pt to continue taking begin taking 325 mg Bayer. Pt said Dr. Vonita Moss as [facility] put pt on 81 mg bayer before surgery. Now that pt has had the surgery and is continuing care with Dr. Antoine Poche pt wants to know if Dr. Antoine Poche recommends that pt go back to taking 325 mg or remain taking the 81 mg. Please return pt call to discuss further.

## 2011-01-16 NOTE — Telephone Encounter (Signed)
81 mg is OK.

## 2011-01-17 NOTE — Telephone Encounter (Signed)
Pt aware 81 mg is fine.  He will continue on this

## 2011-02-24 DIAGNOSIS — N393 Stress incontinence (female) (male): Secondary | ICD-10-CM | POA: Diagnosis not present

## 2011-02-24 DIAGNOSIS — R32 Unspecified urinary incontinence: Secondary | ICD-10-CM | POA: Diagnosis not present

## 2011-02-24 DIAGNOSIS — T8389XA Other specified complication of genitourinary prosthetic devices, implants and grafts, initial encounter: Secondary | ICD-10-CM | POA: Diagnosis not present

## 2011-03-30 DIAGNOSIS — I10 Essential (primary) hypertension: Secondary | ICD-10-CM | POA: Diagnosis not present

## 2011-04-28 ENCOUNTER — Encounter: Payer: Self-pay | Admitting: Cardiology

## 2011-04-28 ENCOUNTER — Ambulatory Visit (INDEPENDENT_AMBULATORY_CARE_PROVIDER_SITE_OTHER): Payer: Medicare Other | Admitting: Cardiology

## 2011-04-28 VITALS — BP 156/88 | HR 62 | Ht 73.0 in | Wt 242.0 lb

## 2011-04-28 DIAGNOSIS — E782 Mixed hyperlipidemia: Secondary | ICD-10-CM

## 2011-04-28 DIAGNOSIS — I10 Essential (primary) hypertension: Secondary | ICD-10-CM | POA: Diagnosis not present

## 2011-04-28 DIAGNOSIS — E663 Overweight: Secondary | ICD-10-CM

## 2011-04-28 DIAGNOSIS — Z01810 Encounter for preprocedural cardiovascular examination: Secondary | ICD-10-CM

## 2011-04-28 DIAGNOSIS — I2510 Atherosclerotic heart disease of native coronary artery without angina pectoris: Secondary | ICD-10-CM | POA: Diagnosis not present

## 2011-04-28 LAB — LIPID PANEL
Cholesterol: 118 mg/dL (ref 0–200)
Triglycerides: 85 mg/dL (ref 0.0–149.0)

## 2011-04-28 NOTE — Assessment & Plan Note (Signed)
The patient has no new sypmtoms.  No further cardiovascular testing is indicated.  We will continue with aggressive risk reduction and meds as listed.  

## 2011-04-28 NOTE — Assessment & Plan Note (Signed)
The patient understands the need to lose weight with diet and exercise. We have discussed specific strategies for this.  

## 2011-04-28 NOTE — Progress Notes (Signed)
HPI The patient presents for followup. He had stenting of his circumflex in July of 2011.  Since I last saw him he has been to [facility] and had a urologic procedure. He denies any new cardiovascular symptoms. He exercises 3 times per week.  The patient denies any new symptoms such as chest discomfort, neck or arm discomfort. There has been no new shortness of breath, PND or orthopnea. There have been no reported palpitations, presyncope or syncope.  His weight is up 20 lbs because he eats out so much.  Of note he is to have another urologic procedure at [facility] on the April the 8th.  He has been off his Plavix since the last visit.  Allergies  Allergen Reactions  . Enablex     Current Outpatient Prescriptions  Medication Sig Dispense Refill  . aspirin 325 MG tablet Take 325 mg by mouth daily.        . Cyanocobalamin (B-12) 1000 MCG CAPS 1 tab po qd       . Diphenhydramine-APAP, sleep, (EXCEDRIN PM PO) as needed.        Marland Kitchen esomeprazole (NEXIUM) 40 MG capsule Take 40 mg by mouth daily before breakfast.        . fenofibrate 160 MG tablet Take 160 mg by mouth daily.        . Misc Natural Products (GLUCOSAMINE CHONDROITIN ADV PO) 1 tab po qd       . Multiple Vitamin (MULTIVITAMIN) capsule Take 1 capsule by mouth daily.        . nitroGLYCERIN (NITROSTAT) 0.4 MG SL tablet Place 0.4 mg under the tongue every 5 (five) minutes as needed.        . Omega-3 Fatty Acids (FISH OIL CONCENTRATE PO) 1 tab po qd       . pravastatin (PRAVACHOL) 20 MG tablet Take 1 tablet (20 mg total) by mouth daily.  30 tablet  9    Past Medical History  Diagnosis Date  . HYPERTENSION   . CAD, NATIVE VESSEL     PCI and DES to circumflex  . DVT   . Mixed hyperlipidemia   . PROSTATE CANCER   . GERD     Past Surgical History  Procedure Date  . Prostatectomy   . Knee surgery   . Hernia repair     ROS:  As stated in the HPI and negative for all other systems.  PHYSICAL EXAM BP 156/88  Pulse 62  Ht 6\' 1"  (1.854 m)   Wt 242 lb (109.77 kg)  BMI 31.93 kg/m2 GENERAL:  Well appearing HEENT:  Pupils equal round and reactive, fundi not visualized, oral mucosa unremarkable NECK:  No jugular venous distention, waveform within normal limits, carotid upstroke brisk and symmetric, no bruits, no thyromegaly LYMPHATICS:  No cervical, inguinal adenopathy LUNGS:  Clear to auscultation bilaterally BACK:  No CVA tenderness CHEST:  Unremarkable HEART:  PMI not displaced or sustained,S1 and S2 within normal limits, no S3, no S4, no clicks, no rubs, early peaking right upper sternal border systolic murmur ABD:  Flat, positive bowel sounds normal in frequency in pitch, no bruits, no rebound, no guarding, no midline pulsatile mass, no hepatomegaly, no splenomegaly, obese EXT:  2 plus pulses throughout, no edema, no cyanosis no clubbing SKIN:  No rashes no nodules NEURO:  Cranial nerves II through XII grossly intact, motor grossly intact throughout PSYCH:  Cognitively intact, oriented to person place and time   EKG:  Sinus rhythm, rate 59, axis within  normal limits, intervals within normal limits except first degree AV block, no acute ST-T wave changes. 04/28/2011   ASSESSMENT AND PLAN

## 2011-04-28 NOTE — Patient Instructions (Signed)
Please have blood work today  The current medical regimen is effective;  continue present plan and medications.  Follow up in 1 year with Dr Hochrein.  You will receive a letter in the mail 2 months before you are due.  Please call us when you receive this letter to schedule your follow up appointment.  

## 2011-04-28 NOTE — Assessment & Plan Note (Signed)
He will have a lipid profile today with a goal LDL less than 100 and HDL greater than 40. 

## 2011-04-28 NOTE — Assessment & Plan Note (Signed)
His blood pressure is elevated.  However, on a home diary is not elevated and he has a reliable blood pressure cuff. We will continue the meds as listed with the recommendation for weight loss.

## 2011-04-28 NOTE — Assessment & Plan Note (Signed)
Based on ACC/AHA guidelines, the patient would be at acceptable risk for the planned procedure without further cardiovascular testing. 

## 2011-05-01 ENCOUNTER — Encounter: Payer: Self-pay | Admitting: *Deleted

## 2011-05-08 ENCOUNTER — Encounter: Payer: Self-pay | Admitting: Cardiology

## 2011-05-08 DIAGNOSIS — Z01818 Encounter for other preprocedural examination: Secondary | ICD-10-CM | POA: Diagnosis not present

## 2011-05-08 DIAGNOSIS — R350 Frequency of micturition: Secondary | ICD-10-CM | POA: Diagnosis not present

## 2011-05-08 DIAGNOSIS — T8389XA Other specified complication of genitourinary prosthetic devices, implants and grafts, initial encounter: Secondary | ICD-10-CM | POA: Diagnosis not present

## 2011-05-08 DIAGNOSIS — R011 Cardiac murmur, unspecified: Secondary | ICD-10-CM | POA: Diagnosis not present

## 2011-05-08 DIAGNOSIS — I2510 Atherosclerotic heart disease of native coronary artery without angina pectoris: Secondary | ICD-10-CM | POA: Diagnosis not present

## 2011-05-08 DIAGNOSIS — E785 Hyperlipidemia, unspecified: Secondary | ICD-10-CM | POA: Diagnosis not present

## 2011-05-08 DIAGNOSIS — C61 Malignant neoplasm of prostate: Secondary | ICD-10-CM | POA: Diagnosis not present

## 2011-05-09 ENCOUNTER — Telehealth: Payer: Self-pay | Admitting: Cardiology

## 2011-05-09 NOTE — Telephone Encounter (Signed)
New problem:  Per Arline Asp calling, patient had echo done at office on yesterday. Upcoming surgery . Echo show mild to moderate stenosis. Will be faxing over test results.

## 2011-05-09 NOTE — Telephone Encounter (Signed)
Aware.  Dr Hochrein's last office note that includes surgical clearance faxed to [facility].

## 2011-05-15 DIAGNOSIS — T8389XA Other specified complication of genitourinary prosthetic devices, implants and grafts, initial encounter: Secondary | ICD-10-CM | POA: Diagnosis not present

## 2011-05-15 DIAGNOSIS — Z7982 Long term (current) use of aspirin: Secondary | ICD-10-CM | POA: Diagnosis not present

## 2011-05-15 DIAGNOSIS — IMO0002 Reserved for concepts with insufficient information to code with codable children: Secondary | ICD-10-CM | POA: Diagnosis not present

## 2011-05-15 DIAGNOSIS — R32 Unspecified urinary incontinence: Secondary | ICD-10-CM | POA: Diagnosis not present

## 2011-05-15 DIAGNOSIS — I252 Old myocardial infarction: Secondary | ICD-10-CM | POA: Diagnosis not present

## 2011-05-15 DIAGNOSIS — Y831 Surgical operation with implant of artificial internal device as the cause of abnormal reaction of the patient, or of later complication, without mention of misadventure at the time of the procedure: Secondary | ICD-10-CM | POA: Diagnosis not present

## 2011-05-15 DIAGNOSIS — K219 Gastro-esophageal reflux disease without esophagitis: Secondary | ICD-10-CM | POA: Diagnosis not present

## 2011-05-15 DIAGNOSIS — I2510 Atherosclerotic heart disease of native coronary artery without angina pectoris: Secondary | ICD-10-CM | POA: Diagnosis not present

## 2011-05-15 DIAGNOSIS — Z8546 Personal history of malignant neoplasm of prostate: Secondary | ICD-10-CM | POA: Diagnosis not present

## 2011-05-15 DIAGNOSIS — I10 Essential (primary) hypertension: Secondary | ICD-10-CM | POA: Diagnosis not present

## 2011-05-15 DIAGNOSIS — Z79899 Other long term (current) drug therapy: Secondary | ICD-10-CM | POA: Diagnosis not present

## 2011-05-15 DIAGNOSIS — E785 Hyperlipidemia, unspecified: Secondary | ICD-10-CM | POA: Diagnosis not present

## 2011-05-15 DIAGNOSIS — Z86718 Personal history of other venous thrombosis and embolism: Secondary | ICD-10-CM | POA: Diagnosis not present

## 2011-05-19 ENCOUNTER — Other Ambulatory Visit: Payer: Self-pay | Admitting: Cardiology

## 2011-05-19 NOTE — Telephone Encounter (Signed)
..   Requested Prescriptions   Pending Prescriptions Disp Refills  . fenofibrate 160 MG tablet [Pharmacy Med Name: FENOFIBRATE 160 MG TABLET] 90 tablet 4    Sig: ONE DAILY

## 2011-07-06 ENCOUNTER — Ambulatory Visit: Payer: Self-pay | Admitting: Cardiology

## 2011-07-13 ENCOUNTER — Encounter (HOSPITAL_COMMUNITY): Payer: Self-pay | Admitting: Emergency Medicine

## 2011-07-13 ENCOUNTER — Emergency Department (INDEPENDENT_AMBULATORY_CARE_PROVIDER_SITE_OTHER)
Admission: EM | Admit: 2011-07-13 | Discharge: 2011-07-13 | Disposition: A | Discharge status: Home / Self Care | Payer: Medicare Other | Source: Home / Self Care | Attending: Family Medicine | Admitting: Family Medicine

## 2011-07-13 DIAGNOSIS — H9209 Otalgia, unspecified ear: Secondary | ICD-10-CM | POA: Diagnosis not present

## 2011-07-13 NOTE — Discharge Instructions (Signed)
Follow up with your pcp or return if symptoms persist or worsen.

## 2011-07-13 NOTE — ED Provider Notes (Signed)
History     CSN: [id number]  Arrival date & time 07/13/11  1108   First MD Initiated Contact with Patient 07/13/11 1109      Chief Complaint  Patient presents with  . Otalgia    (Consider location/radiation/quality/duration/timing/severity/associated sxs/prior treatment) HPI Comments: The patient reports having some right ear discomfort. States feels like something moving on occasion. No injury, no discharge, no pain except on one occasion he pressed on his ear and felt sharpe pain like being stuck with a needle. Had a hair cut 2 days ago and they did trim hair in his ears. No cough and cold symptoms. No new hearing loss  The history is provided by the patient.    Past Medical History  Diagnosis Date  . HYPERTENSION   . CAD, NATIVE VESSEL     PCI and DES to circumflex  . DVT   . Mixed hyperlipidemia   . PROSTATE CANCER   . GERD     Past Surgical History  Procedure Date  . Prostatectomy   . Knee surgery   . Hernia repair     History reviewed. No pertinent family history.  History  Substance Use Topics  . Smoking status: Former Smoker    Quit date: 04/27/1961  . Smokeless tobacco: Not on file  . Alcohol Use: No      Review of Systems  Constitutional: Negative.   HENT: Negative for hearing loss, ear pain, congestion, rhinorrhea, neck stiffness, tinnitus and ear discharge.   Respiratory: Negative.   Cardiovascular: Negative.   Genitourinary: Negative.   Musculoskeletal: Negative.     Allergies  Darifenacin hydrobromide er  Home Medications   Current Outpatient Rx  Name Route Sig Dispense Refill  . ESOMEPRAZOLE MAGNESIUM 40 MG PO CPDR Oral Take 40 mg by mouth daily before breakfast.      . MULTIVITAMINS PO CAPS Oral Take 1 capsule by mouth daily.      Marland Kitchen PRAVASTATIN SODIUM 20 MG PO TABS Oral Take 1 tablet (20 mg total) by mouth daily. 30 tablet 9  . ASPIRIN 81 MG PO TABS Oral Take 81 mg by mouth daily.    . B-12 1000 MCG PO CAPS  1 tab po qd     .  EXCEDRIN PM PO  as needed.      . FENOFIBRATE 160 MG PO TABS  ONE DAILY 90 tablet 4  . GLUCOSAMINE CHONDROITIN ADV PO  1 tab po qd     . NITROGLYCERIN 0.4 MG SL SUBL Sublingual Place 0.4 mg under the tongue every 5 (five) minutes as needed.      Marland Kitchen FISH OIL CONCENTRATE PO  1 tab po qd       BP 163/100  Pulse 64  Temp(Src) 98.7 F (37.1 C) (Oral)  Resp 16  SpO2 95%  Physical Exam  Nursing note and vitals reviewed. Constitutional: He appears well-developed and well-nourished. No distress.  HENT:  Head: Normocephalic and atraumatic.       Tm's clear, eac's clear, does have a lot of long hair deep in eac. This was clipped .  Cardiovascular: Normal rate and regular rhythm.   Pulmonary/Chest: Effort normal and breath sounds normal.  Skin: Skin is warm and dry.    ED Course  Procedures (including critical care time)  Labs Reviewed - No data to display No results found.   1. Otalgia       MDM          Randa Spike, MD 07/13/11  1208 

## 2011-07-13 NOTE — ED Notes (Signed)
Pt here with right ear soreness and discomfort that started Tuesday.STATES" IT FEELS LIKE A PIN IS STUCK IN IT".DENIES BLURRY VISION OR DIZZINESS.TRIED EAR DROPS

## 2011-07-24 ENCOUNTER — Other Ambulatory Visit: Payer: Self-pay | Admitting: Cardiology

## 2011-07-26 DIAGNOSIS — K219 Gastro-esophageal reflux disease without esophagitis: Secondary | ICD-10-CM | POA: Diagnosis not present

## 2011-07-26 DIAGNOSIS — E785 Hyperlipidemia, unspecified: Secondary | ICD-10-CM | POA: Diagnosis not present

## 2011-07-26 DIAGNOSIS — N183 Chronic kidney disease, stage 3 unspecified: Secondary | ICD-10-CM | POA: Diagnosis not present

## 2011-07-26 DIAGNOSIS — T148 Other injury of unspecified body region: Secondary | ICD-10-CM | POA: Diagnosis not present

## 2011-07-26 DIAGNOSIS — W57XXXA Bitten or stung by nonvenomous insect and other nonvenomous arthropods, initial encounter: Secondary | ICD-10-CM | POA: Diagnosis not present

## 2011-07-26 DIAGNOSIS — R059 Cough, unspecified: Secondary | ICD-10-CM | POA: Diagnosis not present

## 2011-07-26 DIAGNOSIS — R05 Cough: Secondary | ICD-10-CM | POA: Diagnosis not present

## 2011-08-15 DIAGNOSIS — R300 Dysuria: Secondary | ICD-10-CM | POA: Diagnosis not present

## 2011-09-12 DIAGNOSIS — N393 Stress incontinence (female) (male): Secondary | ICD-10-CM | POA: Diagnosis not present

## 2011-09-12 DIAGNOSIS — N390 Urinary tract infection, site not specified: Secondary | ICD-10-CM | POA: Diagnosis not present

## 2011-09-14 DIAGNOSIS — R011 Cardiac murmur, unspecified: Secondary | ICD-10-CM | POA: Insufficient documentation

## 2011-09-14 DIAGNOSIS — I219 Acute myocardial infarction, unspecified: Secondary | ICD-10-CM | POA: Insufficient documentation

## 2011-09-14 DIAGNOSIS — I2510 Atherosclerotic heart disease of native coronary artery without angina pectoris: Secondary | ICD-10-CM | POA: Diagnosis not present

## 2011-09-14 DIAGNOSIS — Z01818 Encounter for other preprocedural examination: Secondary | ICD-10-CM | POA: Diagnosis not present

## 2011-09-15 DIAGNOSIS — Y831 Surgical operation with implant of artificial internal device as the cause of abnormal reaction of the patient, or of later complication, without mention of misadventure at the time of the procedure: Secondary | ICD-10-CM | POA: Diagnosis not present

## 2011-09-15 DIAGNOSIS — T8389XA Other specified complication of genitourinary prosthetic devices, implants and grafts, initial encounter: Secondary | ICD-10-CM | POA: Diagnosis not present

## 2011-09-28 ENCOUNTER — Encounter (HOSPITAL_COMMUNITY): Payer: Self-pay | Admitting: *Deleted

## 2011-09-28 ENCOUNTER — Emergency Department (HOSPITAL_COMMUNITY): Payer: Medicare Other

## 2011-09-28 ENCOUNTER — Inpatient Hospital Stay (HOSPITAL_COMMUNITY)
Admission: EM | Admit: 2011-09-28 | Discharge: 2011-10-04 | DRG: 176 | Disposition: A | Discharge status: Home / Self Care | Payer: Medicare Other | Attending: Pulmonary Disease | Admitting: Pulmonary Disease

## 2011-09-28 DIAGNOSIS — I10 Essential (primary) hypertension: Secondary | ICD-10-CM

## 2011-09-28 DIAGNOSIS — R0602 Shortness of breath: Secondary | ICD-10-CM | POA: Diagnosis not present

## 2011-09-28 DIAGNOSIS — R0609 Other forms of dyspnea: Secondary | ICD-10-CM | POA: Diagnosis not present

## 2011-09-28 DIAGNOSIS — C61 Malignant neoplasm of prostate: Secondary | ICD-10-CM

## 2011-09-28 DIAGNOSIS — R918 Other nonspecific abnormal finding of lung field: Secondary | ICD-10-CM | POA: Diagnosis not present

## 2011-09-28 DIAGNOSIS — I252 Old myocardial infarction: Secondary | ICD-10-CM

## 2011-09-28 DIAGNOSIS — Z8546 Personal history of malignant neoplasm of prostate: Secondary | ICD-10-CM | POA: Diagnosis not present

## 2011-09-28 DIAGNOSIS — J984 Other disorders of lung: Secondary | ICD-10-CM | POA: Diagnosis present

## 2011-09-28 DIAGNOSIS — Z01810 Encounter for preprocedural cardiovascular examination: Secondary | ICD-10-CM

## 2011-09-28 DIAGNOSIS — Z9861 Coronary angioplasty status: Secondary | ICD-10-CM

## 2011-09-28 DIAGNOSIS — I129 Hypertensive chronic kidney disease with stage 1 through stage 4 chronic kidney disease, or unspecified chronic kidney disease: Secondary | ICD-10-CM | POA: Diagnosis present

## 2011-09-28 DIAGNOSIS — I82409 Acute embolism and thrombosis of unspecified deep veins of unspecified lower extremity: Secondary | ICD-10-CM

## 2011-09-28 DIAGNOSIS — N289 Disorder of kidney and ureter, unspecified: Secondary | ICD-10-CM | POA: Diagnosis present

## 2011-09-28 DIAGNOSIS — Z87891 Personal history of nicotine dependence: Secondary | ICD-10-CM

## 2011-09-28 DIAGNOSIS — I2699 Other pulmonary embolism without acute cor pulmonale: Secondary | ICD-10-CM | POA: Diagnosis not present

## 2011-09-28 DIAGNOSIS — Z86718 Personal history of other venous thrombosis and embolism: Secondary | ICD-10-CM | POA: Diagnosis not present

## 2011-09-28 DIAGNOSIS — Z7982 Long term (current) use of aspirin: Secondary | ICD-10-CM

## 2011-09-28 DIAGNOSIS — I2510 Atherosclerotic heart disease of native coronary artery without angina pectoris: Secondary | ICD-10-CM | POA: Diagnosis not present

## 2011-09-28 DIAGNOSIS — Z79899 Other long term (current) drug therapy: Secondary | ICD-10-CM

## 2011-09-28 DIAGNOSIS — E663 Overweight: Secondary | ICD-10-CM

## 2011-09-28 DIAGNOSIS — Z7901 Long term (current) use of anticoagulants: Secondary | ICD-10-CM

## 2011-09-28 DIAGNOSIS — K219 Gastro-esophageal reflux disease without esophagitis: Secondary | ICD-10-CM

## 2011-09-28 DIAGNOSIS — N183 Chronic kidney disease, stage 3 unspecified: Secondary | ICD-10-CM | POA: Diagnosis present

## 2011-09-28 DIAGNOSIS — N179 Acute kidney failure, unspecified: Secondary | ICD-10-CM | POA: Diagnosis not present

## 2011-09-28 DIAGNOSIS — R0989 Other specified symptoms and signs involving the circulatory and respiratory systems: Secondary | ICD-10-CM | POA: Diagnosis not present

## 2011-09-28 DIAGNOSIS — I359 Nonrheumatic aortic valve disorder, unspecified: Secondary | ICD-10-CM | POA: Diagnosis not present

## 2011-09-28 DIAGNOSIS — I119 Hypertensive heart disease without heart failure: Secondary | ICD-10-CM | POA: Diagnosis present

## 2011-09-28 DIAGNOSIS — R0902 Hypoxemia: Secondary | ICD-10-CM

## 2011-09-28 DIAGNOSIS — E782 Mixed hyperlipidemia: Secondary | ICD-10-CM | POA: Diagnosis not present

## 2011-09-28 DIAGNOSIS — R42 Dizziness and giddiness: Secondary | ICD-10-CM

## 2011-09-28 DIAGNOSIS — R0603 Acute respiratory distress: Secondary | ICD-10-CM

## 2011-09-28 LAB — CBC WITH DIFFERENTIAL/PLATELET
Basophils Absolute: 0 K/uL (ref 0.0–0.1)
Basophils Relative: 0 % (ref 0–1)
Eosinophils Absolute: 0.1 10*3/uL (ref 0.0–0.7)
Eosinophils Relative: 1 % (ref 0–5)
HCT: 48.9 % (ref 39.0–52.0)
Hemoglobin: 17.6 g/dL — ABNORMAL HIGH (ref 13.0–17.0)
Lymphocytes Relative: 15 % (ref 12–46)
Lymphs Abs: 1.4 K/uL (ref 0.7–4.0)
MCH: 29.6 pg (ref 26.0–34.0)
MCHC: 36 g/dL (ref 30.0–36.0)
MCV: 82.2 fL (ref 78.0–100.0)
Monocytes Absolute: 0.7 10*3/uL (ref 0.1–1.0)
Monocytes Relative: 8 % (ref 3–12)
Neutro Abs: 7.2 K/uL (ref 1.7–7.7)
Neutrophils Relative %: 76 % (ref 43–77)
Platelets: 181 K/uL (ref 150–400)
RBC: 5.95 MIL/uL — ABNORMAL HIGH (ref 4.22–5.81)
RDW: 14 % (ref 11.5–15.5)
WBC: 9.4 K/uL (ref 4.0–10.5)

## 2011-09-28 LAB — BASIC METABOLIC PANEL
BUN: 30 mg/dL — ABNORMAL HIGH (ref 6–23)
Chloride: 106 mEq/L (ref 96–112)
Creatinine, Ser: 1.88 mg/dL — ABNORMAL HIGH (ref 0.50–1.35)
GFR calc Af Amer: 38 mL/min — ABNORMAL LOW (ref >90)
GFR calc non Af Amer: 33 mL/min — ABNORMAL LOW (ref >90)
Glucose, Bld: 128 mg/dL — ABNORMAL HIGH (ref 70–99)

## 2011-09-28 LAB — PROTIME-INR
INR: 1.38 (ref 0.00–1.49)
Prothrombin Time: 17.2 seconds — ABNORMAL HIGH (ref 11.6–15.2)

## 2011-09-28 LAB — BASIC METABOLIC PANEL WITH GFR
CO2: 20 meq/L (ref 19–32)
Calcium: 10.7 mg/dL — ABNORMAL HIGH (ref 8.4–10.5)
Potassium: 4.4 meq/L (ref 3.5–5.1)
Sodium: 139 meq/L (ref 135–145)

## 2011-09-28 LAB — APTT: aPTT: 78 s — ABNORMAL HIGH (ref 24–37)

## 2011-09-28 LAB — TROPONIN I
Troponin I: 0.32 ng/mL (ref <0.30)
Troponin I: 0.34 ng/mL (ref <0.30)

## 2011-09-28 LAB — MRSA PCR SCREENING: MRSA by PCR: NEGATIVE

## 2011-09-28 MED ORDER — ASPIRIN EC 81 MG PO TBEC
81.0000 mg | DELAYED_RELEASE_TABLET | Freq: Every day | ORAL | Status: DC
  Administered 2011-09-28 – 2011-10-04 (×7): 81 mg via ORAL
  Filled 2011-09-28 (×7): qty 1

## 2011-09-28 MED ORDER — ASPIRIN 81 MG PO TABS: 81.0000 mg | ORAL_TABLET | Freq: Every day | ORAL | Status: DC

## 2011-09-28 MED ORDER — VITAMIN B-12 1000 MCG PO TABS
1000.0000 ug | ORAL_TABLET | Freq: Every day | ORAL | Status: DC
  Administered 2011-09-29 – 2011-10-04 (×6): 1000 ug via ORAL
  Filled 2011-09-28 (×6): qty 1

## 2011-09-28 MED ORDER — FENOFIBRATE 160 MG PO TABS
160.0000 mg | ORAL_TABLET | Freq: Every day | ORAL | Status: DC
  Administered 2011-09-29 – 2011-10-04 (×6): 160 mg via ORAL
  Filled 2011-09-28 (×6): qty 1

## 2011-09-28 MED ORDER — B-12 1000 MCG PO CAPS: 1.0000 | ORAL_CAPSULE | Freq: Every day | ORAL | Status: DC

## 2011-09-28 MED ORDER — HEPARIN BOLUS VIA INFUSION
3000.0000 [IU] | Freq: Once | INTRAVENOUS | Status: AC
  Administered 2011-09-28: 3000 [IU] via INTRAVENOUS
  Filled 2011-09-28: qty 3000

## 2011-09-28 MED ORDER — PANTOPRAZOLE SODIUM 40 MG PO TBEC
40.0000 mg | DELAYED_RELEASE_TABLET | Freq: Every day | ORAL | Status: DC
  Administered 2011-09-29 – 2011-10-04 (×6): 40 mg via ORAL
  Filled 2011-09-28 (×6): qty 1

## 2011-09-28 MED ORDER — HEPARIN (PORCINE) IN NACL 100-0.45 UNIT/ML-% IJ SOLN
1700.0000 [IU]/h | INTRAMUSCULAR | Status: DC
  Administered 2011-09-28: 1250 [IU]/h via INTRAVENOUS
  Administered 2011-09-29: 1600 [IU]/h via INTRAVENOUS
  Filled 2011-09-28 (×3): qty 250

## 2011-09-28 MED ORDER — SODIUM CHLORIDE 0.9 % IV BOLUS (SEPSIS)
1000.0000 mL | Freq: Once | INTRAVENOUS | Status: AC
  Administered 2011-09-28: 1000 mL via INTRAVENOUS

## 2011-09-28 MED ORDER — ASPIRIN 81 MG PO CHEW
324.0000 mg | CHEWABLE_TABLET | Freq: Once | ORAL | Status: AC
  Administered 2011-09-28: 324 mg via ORAL
  Filled 2011-09-28: qty 4

## 2011-09-28 MED ORDER — OXYBUTYNIN CHLORIDE 5 MG PO TABS
5.0000 mg | ORAL_TABLET | Freq: Three times a day (TID) | ORAL | Status: DC
  Administered 2011-09-28 – 2011-10-04 (×17): 5 mg via ORAL
  Filled 2011-09-28 (×20): qty 1

## 2011-09-28 MED ORDER — ADULT MULTIVITAMIN W/MINERALS CH
1.0000 | ORAL_TABLET | Freq: Every day | ORAL | Status: DC
  Administered 2011-09-29 – 2011-10-04 (×6): 1 via ORAL
  Filled 2011-09-28 (×6): qty 1

## 2011-09-28 MED ORDER — SIMVASTATIN 10 MG PO TABS
10.0000 mg | ORAL_TABLET | Freq: Every day | ORAL | Status: DC
  Administered 2011-09-29 – 2011-10-03 (×5): 10 mg via ORAL
  Filled 2011-09-28 (×6): qty 1

## 2011-09-28 MED ORDER — HEPARIN BOLUS VIA INFUSION
4000.0000 [IU] | Freq: Once | INTRAVENOUS | Status: AC
  Administered 2011-09-28: 4000 [IU] via INTRAVENOUS

## 2011-09-28 MED ORDER — MULTIVITAMINS PO CAPS: 1.0000 | ORAL_CAPSULE | Freq: Every day | ORAL | Status: DC

## 2011-09-28 MED ORDER — SODIUM CHLORIDE 0.9 % IV SOLN: 250.0000 mL | INTRAVENOUS | Status: DC | PRN

## 2011-09-28 MED ORDER — XENON XE 133 GAS
16.0000 | GAS_FOR_INHALATION | Freq: Once | RESPIRATORY_TRACT | Status: AC | PRN
  Administered 2011-09-28: 16 via RESPIRATORY_TRACT

## 2011-09-28 MED ORDER — TECHNETIUM TO 99M ALBUMIN AGGREGATED
3.0000 | Freq: Once | INTRAVENOUS | Status: AC | PRN
  Administered 2011-09-28: 3 via INTRAVENOUS

## 2011-09-28 NOTE — ED Provider Notes (Signed)
History     CSN: [id number]  Arrival date & time 09/28/11  1204   First MD Initiated Contact with Patient 09/28/11 1207      Chief Complaint  Patient presents with  . Respiratory Distress    (Consider location/radiation/quality/duration/timing/severity/associated sxs/prior treatment) Patient is a 78 y.o. male presenting with shortness of breath. The history is provided by the patient.  Shortness of Breath  The current episode started yesterday. The onset was sudden. Episode frequency: intermittently. The problem has been gradually worsening. The problem is moderate. The symptoms are relieved by rest. The symptoms are aggravated by activity. Associated symptoms include shortness of breath. Pertinent negatives include no chest pain, no chest pressure, no fever, no cough and no wheezing.    Past Medical History  Diagnosis Date  . HYPERTENSION   . CAD, NATIVE VESSEL     PCI and DES to circumflex  . DVT   . Mixed hyperlipidemia   . PROSTATE CANCER   . GERD   . Prostate cancer   . MI (myocardial infarction)     Past Surgical History  Procedure Date  . Prostatectomy   . Knee surgery   . Hernia repair     No family history on file.  History  Substance Use Topics  . Smoking status: Former Smoker    Quit date: 04/27/1961  . Smokeless tobacco: Not on file  . Alcohol Use: No      Review of Systems  Constitutional: Negative for fever.  HENT: Negative for neck pain.   Respiratory: Positive for shortness of breath. Negative for cough, chest tightness and wheezing.   Cardiovascular: Negative for chest pain and leg swelling.  Gastrointestinal: Negative for nausea, vomiting and abdominal pain.  Genitourinary:       Has urinary catheter since 8/9  Musculoskeletal: Negative.   Skin: Negative.   Neurological: Positive for light-headedness. Negative for syncope.  All other systems reviewed and are negative.    Allergies  Darifenacin hydrobromide er  Home Medications    Current Outpatient Rx  Name Route Sig Dispense Refill  . ASPIRIN 81 MG PO TABS Oral Take 81 mg by mouth daily.    . B-12 1000 MCG PO CAPS  1 tab po qd     . EXCEDRIN PM PO  as needed.      Marland Kitchen ESOMEPRAZOLE MAGNESIUM 40 MG PO CPDR Oral Take 40 mg by mouth daily before breakfast.      . FENOFIBRATE 160 MG PO TABS  ONE DAILY 90 tablet 4  . GLUCOSAMINE CHONDROITIN ADV PO  1 tab po qd     . MULTIVITAMINS PO CAPS Oral Take 1 capsule by mouth daily.      Marland Kitchen NITROGLYCERIN 0.4 MG SL SUBL Sublingual Place 0.4 mg under the tongue every 5 (five) minutes as needed.      Marland Kitchen FISH OIL CONCENTRATE PO  1 tab po qd     . PRAVASTATIN SODIUM 20 MG PO TABS  TAKE 1 TABLET EVERY DAY 30 tablet 9    BP 114/65  Pulse 110  Temp 97.9 F (36.6 C)  Resp 22  Ht 6\' 1"  (1.854 m)  Wt 237 lb (107.502 kg)  BMI 31.27 kg/m2  SpO2 91%  Physical Exam  Nursing note and vitals reviewed. Constitutional: He is oriented to person, place, and time. He appears well-developed and well-nourished.  HENT:  Head: Normocephalic and atraumatic.  Right Ear: External ear normal.  Left Ear: External ear normal.  Nose: Nose normal.  Eyes: Right eye exhibits no discharge. Left eye exhibits no discharge.  Neck: Neck supple.  Cardiovascular: Normal rate, regular rhythm, normal heart sounds and intact distal pulses.   Pulmonary/Chest: Effort normal and breath sounds normal. He has no wheezes. He has no rales.  Abdominal: Soft. He exhibits no distension and no mass. There is no tenderness. There is no rebound and no guarding.  Genitourinary:       Urinary catheter in place  Musculoskeletal: He exhibits no edema and no tenderness.  Neurological: He is alert and oriented to person, place, and time.  Skin: Skin is warm and dry.    ED Course  Procedures (including critical care time)  Labs Reviewed  CBC WITH DIFFERENTIAL - Abnormal; Notable for the following:    RBC 5.95 (*)     Hemoglobin 17.6 (*)     All other components within  normal limits  BASIC METABOLIC PANEL - Abnormal; Notable for the following:    Glucose, Bld 128 (*)     BUN 30 (*)     Creatinine, Ser 1.88 (*)     Calcium 10.7 (*)     GFR calc non Af Amer 33 (*)     GFR calc Af Amer 38 (*)     All other components within normal limits  TROPONIN I - Abnormal; Notable for the following:    Troponin I 0.32 (*)     All other components within normal limits  HEPARIN LEVEL (UNFRACTIONATED)  APTT  PROTIME-INR   Dg Chest 2 View  09/28/2011  *RADIOLOGY REPORT*  Clinical Data: Respiratory distress  CHEST - 2 VIEW  Comparison: 08/25/2009  Findings: Cardiomediastinal silhouette is stable.  No acute infiltrate or pleural effusion.  No pulmonary edema.  Surgical clips in the left axilla again noted.  Bony thorax is stable.  IMPRESSION: No active disease.  No significant change.   Original Report Authenticated By: Natasha Mead, M.D.    Nm Pulmonary Perf And Vent  09/28/2011  *RADIOLOGY REPORT*  Clinical Data: Evaluate for pulmonary embolus  NM PULMONARY VENTILATION AND PERFUSION SCAN  Radiopharmaceutical: CURIE MAA TECHNETIUM TO 82M ALBUMIN AGGREGATED, CURIE xenon xe 133 gas 16 milli Curie XENON XE 133 GAS  Comparison: Chest radiograph from today  Findings: On the ventilation portion of the examination there is mild xenon tracer retention within both upper lobes.  On the perfusion portion of the examination there is a large segmental perfusion defects within the right midlung.  Multiple large segmental perfusion defects are noted in the left midlung and left base.  These are unmatched when compared with the ventilation images.  IMPRESSION:  1.  Examination is high probability for acute pulmonary embolus.  Critical Value/emergent results were called by telephone at the time of interpretation on 09/28/2011 at 4:16 pm to Dr. Oletta Lamas, who verbally acknowledged these results.   Original Report Authenticated By: Rosealee Albee, M.D.      Date: 09/28/2011  Rate: 113   Rhythm: sinus tachycardia  QRS Axis: left  Intervals: normal  ST/T Wave abnormalities: nonspecific T wave changes  Conduction Disutrbances:none  Narrative Interpretation: Sinus tachycardia without evidence of ischemia  Old EKG Reviewed: unchanged   1. Pulmonary embolism       MDM  VQ scan with high probability for PE and given high pre-test probability patient treated with heparin prior to VQ. Elevated trop puts at higher risk for needing thromoblysis (though at this time hemodynamically stable), will admit to ICU.  Pricilla Loveless, MD 09/28/11 [phone]

## 2011-09-28 NOTE — Progress Notes (Addendum)
ANTICOAGULATION CONSULT NOTE - Initial Consult  Pharmacy Consult for Heparin Indication: VQ scan shows high probability for PE Chief Complaint  Patient presents with  . Respiratory Distress    Allergies  Allergen Reactions  . Darifenacin Hydrobromide Er Nausea Only    Dizziness    Patient Measurements: Height: 6\' 1"  (185.4 cm) Weight: 237 lb (107.502 kg) IBW/kg (Calculated) : 79.9  Heparin Dosing Weight: 89 kg  Vital Signs: Temp: 97.9 F (36.6 C) (08/22 1212) BP: 104/68 mmHg (08/22 1336) Pulse Rate: 106  (08/22 1315)  Labs:  Basename 09/28/11 1220  HGB 17.6*  HCT 48.9  PLT 181  APTT --  LABPROT --  INR --  HEPARINUNFRC --  CREATININE 1.88*  CKTOTAL --  CKMB --  TROPONINI 0.32*    Estimated Creatinine Clearance: 41.6 ml/min (by C-G formula based on Cr of 1.88).   Medical History: Past Medical History  Diagnosis Date  . HYPERTENSION   . CAD, NATIVE VESSEL     PCI and DES to circumflex  . DVT   . Mixed hyperlipidemia   . PROSTATE CANCER   . GERD   . Prostate cancer   . MI (myocardial infarction)      Assessment: 78 year old man admitted for respiratory distress and positive troponin.  Heparin per pharmacy to start for ACS Goal of Therapy:  Heparin level 0.3-0.7 units/ml Monitor platelets by anticoagulation protocol: Yes   Plan:  Give 4000 units bolus x 1 Start heparin infusion at 1250 units/hr Check anti-Xa level in 8 hours and daily while on heparin Continue to monitor H&H and platelets.  Mickeal Skinner 09/28/2011,1:52 PM  Addendum: VQ scan shows high probability for PE. Will continue heparin gtt. Will need to be cautious with recent urological surgery and watch for s/s bleeding.  Christoper Fabian, PharmD, BCPS Clinical pharmacist, pager [phone] 09/28/2011   7:56 PM

## 2011-09-28 NOTE — H&P (Signed)
Name: [name] MRN: [id number] DOB: 1931/11/12    LOS: 0   Primary Urologist:  Dr. Myrtie Hawk [phone] PCP - [facility] / Critical Care Note   History of Present Illness: 78 y/o M with PMH of HTN, CAD s/p MI with DES to circumflex, HLD, DVT LE in 2006 s/p 1 year coumadin / IVC filter placement and prostate cancer in 2006 s/p multiple urologic procedures, most recent 09/15/11 with removal of hardware and placement of foley catheter, presented to [facility] ED 8/22 with a two day history of shortness of breath with acute worsening day of presentation.  ER evaluation demonstrated mildly elevated troponin and VQ scan with high probability of PE.  Rx'd with heparin gtt and PCCM called for admit.     Denies recent long travel.  Wears leg bag with tight straps on R leg.  Denies chest pain, lower extremity swelling, cough, syncope, dizziness, diaphoresis.     Tests / Events: 8/22 2D ECHO>>>  doppler >>    Past Medical History  Diagnosis Date  . HYPERTENSION   . CAD, NATIVE VESSEL     PCI and DES to circumflex  . DVT   . Mixed hyperlipidemia   . PROSTATE CANCER   . GERD   . Prostate cancer   . MI (myocardial infarction)     Past Surgical History  Procedure Date  . Prostatectomy   . Knee surgery   . Hernia repair   . Vena cava filter placement     Prior to Admission medications   Medication Sig Start Date End Date Taking? Authorizing Provider  aspirin 81 MG tablet Take 81 mg by mouth daily.   Yes Historical Provider, MD  Cyanocobalamin (B-12) 1000 MCG CAPS 1 tab po qd    Yes Historical Provider, MD  Diphenhydramine-APAP, sleep, (EXCEDRIN PM PO) as needed.     Yes Historical Provider, MD  esomeprazole (NEXIUM) 40 MG capsule Take 40 mg by mouth daily before breakfast.     Yes Historical Provider, MD  fenofibrate 160 MG tablet ONE DAILY 05/19/11  Yes Rollene Rotunda, MD  Misc Natural Products (GLUCOSAMINE CHONDROITIN ADV PO) 1 tab po qd    Yes Historical  Provider, MD  Multiple Vitamin (MULTIVITAMIN) capsule Take 1 capsule by mouth daily.     Yes Historical Provider, MD  nitroGLYCERIN (NITROSTAT) 0.4 MG SL tablet Place 0.4 mg under the tongue every 5 (five) minutes as needed.     Yes Historical Provider, MD  Omega-3 Fatty Acids (FISH OIL CONCENTRATE PO) Take 2 capsules by mouth daily. 1 tab po qd   Yes Historical Provider, MD  OVER THE COUNTER MEDICATION Take 1 tablet by mouth daily.   Yes Historical Provider, MD  oxybutynin (DITROPAN) 5 MG tablet Take 5 mg by mouth 3 (three) times daily.   Yes Historical Provider, MD  pravastatin (PRAVACHOL) 20 MG tablet TAKE 1 TABLET EVERY DAY 07/24/11  Yes Rollene Rotunda, MD    Allergies Allergies  Allergen Reactions  . Darifenacin Hydrobromide Er Nausea Only    Dizziness    Family History History reviewed. No pertinent family history.  Social History  reports that he quit smoking about 50 years ago. He does not have any smokeless tobacco history on file. He reports that he does not drink alcohol. His drug history not on file.  Review Of Systems:  Gen: Denies fever, chills, weight change, fatigue, night sweats HEENT: Denies blurred vision, double vision, hearing loss, tinnitus, sinus  congestion, rhinorrhea, sore throat, neck stiffness, dysphagia PULM: Denies cough, sputum production, hemoptysis, wheezing.  See HPI.   CV: Denies chest pain, edema, orthopnea, paroxysmal nocturnal dyspnea, palpitations GI: Denies abdominal pain, nausea, vomiting, diarrhea, hematochezia, melena, constipation, change in bowel habits GU: Denies dysuria, hematuria, polyuria, oliguria, urethral discharge Endocrine: Denies hot or cold intolerance, polyuria, polyphagia or appetite change Derm: Denies rash, dry skin, scaling or peeling skin change Heme: Denies easy bruising, bleeding, bleeding gums Neuro: Denies headache, numbness, weakness, slurred speech, loss of memory or consciousness  Vital Signs: Temp:  [97.9 F (36.6  C)] 97.9 F (36.6 C) (08/22 1212) Pulse Rate:  [49-115] 93  (08/22 1632) Resp:  [18-26] 18  (08/22 1733) BP: (86-118)/(58-91) 112/65 mmHg (08/22 1733) SpO2:  [91 %-97 %] 96 % (08/22 1733) FiO2 (%):  [2 %] 2 % (08/22 1632) Weight:  [237 lb (107.502 kg)] 237 lb (107.502 kg) (08/22 1219)    Physical Examination: General: wdwn adult male in NAD Neuro: AAOx4, speech clear, MAE CV: s1s2 rrr, no m/r/g, no jvd PULM: resp's even/non-labored, lungs bilaterally diminished but clear GI: round/soft, bsx4 active GU:  Indwelling catheter, suprapubic surgical incision c/d/i, edges well approximated. No erythema or drainage. Extremities: warm/dry, no edema, negative homans    Ventilator settings: Vent Mode:  [-]  FiO2 (%):  [2 %] 2 %  Labs    CBC  Lab 09/28/11 1220  HGB 17.6*  HCT 48.9  WBC 9.4  PLT 181   BMET  Lab 09/28/11 1220  NA 139  K 4.4  CL 106  CO2 20  GLUCOSE 128*  BUN 30*  CREATININE 1.88*  CALCIUM 10.7*  MG --  PHOS --    Lab 09/28/11 1508  INR 1.38    No results found for this basename: PHART:5,PCO2:5,PCO2ART:5,PO2:5,PO2ART:5,HCO3:5,TCO2:5,O2SAT:5 in the last 168 hours   Radiology: 8/22 CXR>>>Cardiomediastinal silhouette is stable. No acute infiltrate or pleural effusion. No pulmonary edema. Surgical clips in the left axilla again noted. Bony thorax is stable.    Assessment and Plan: Principal Problem:  *Pulmonary embolism Active Problems:  PROSTATE CANCER  HYPERTENSION  CAD, NATIVE VESSEL  Mixed hyperlipidemia  GERD   Acute Pulmonary Embolism Assessment:  Hx of DVT in 2006, s/p 1 year of coumadin and IVC filter placement.  Defects noted bilaterally on VQ scan consistent with high probability of PE.   Plan: -ICU admit, monitor for hemodynamic changes -IV heparin gtt-->caution with recent urological surgery -cycle cardiac enzymes -LE doppler's, if negative may need to further investigate IVC filter for clot -likely can begin coumadin in  am   HTN HLD Hx of CAD Assessment: Known HTN, HLD, Hx of MI with DES to circumflex  Plan: -continue home medications -monitor BP    GERD Assessment: chronic / on Nexium as outpatient  Plan: -PPI  Hx of Prostate Cancer Assessment: s/p multiple urological procedures, most recent for 'hardware' removal and placement of foley catheter to allow healing of erosion related to balloon.   Plan: - watch for hematuria given recent surgery -will need to call [facility] urology in am to contact Dr. Vonita Moss to discuss implications of recent surgical procedure and heparin.  Attempted call on 8/22 - office closes at 4pm on Th, 12 noon on fridays -Perhaps switch to lovenox (& ensure no bleeding) before starting coumadin   Best practices / Disposition: -->Code Status: Full -->DVT Px: Heparin Gtt -->GI [id number] -->Diet: PO    Canary Brim, NP-C [facility] Pgr: [phone]  09/28/2011,  5:50 PM    Independently examined pt, evaluated data & formulated above care plan with NP, edited note  Geffrey Michaelsen V.

## 2011-09-28 NOTE — ED Notes (Signed)
Pt here per [facility] with c/o becoming short of breath upon exertion.  Pt. Advises he's had a few episodes of same starting yesterday morning.  Respiratory distress relieved by rest. [facility] transported with O2 and IV started.

## 2011-09-28 NOTE — Progress Notes (Signed)
ANTICOAGULATION CONSULT NOTE - Follow Up Consult  Pharmacy Consult for Heparin Indication: VQ scan shows high probability for PE Chief Complaint  Patient presents with  . Respiratory Distress   Allergies  Allergen Reactions  . Darifenacin Hydrobromide Er Nausea Only    Dizziness   Patient Measurements: Height: 6\' 1"  (185.4 cm) Weight: 233 lb 7.5 oz (105.9 kg) IBW/kg (Calculated) : 79.9  Heparin Dosing Weight: 102 kg  Vital Signs: Temp: 97.5 F (36.4 C) (08/22 2000) Temp src: Oral (08/22 2000) BP: 93/79 mmHg (08/22 2000) Pulse Rate: 89  (08/22 2100)  Labs:  Basename 09/28/11 2138 09/28/11 1624 09/28/11 1508 09/28/11 1220  HGB -- -- -- 17.6*  HCT -- -- -- 48.9  PLT -- -- -- 181  APTT -- -- 78* --  LABPROT -- -- 17.2* --  INR -- -- 1.38 --  HEPARINUNFRC <0.10* -- -- --  CREATININE -- -- -- 1.88*  CKTOTAL -- -- -- --  CKMB -- -- -- --  TROPONINI 0.35* 0.34* -- 0.32*    Estimated Creatinine Clearance: 41.4 ml/min (by C-G formula based on Cr of 1.88).   Medical History: Past Medical History  Diagnosis Date  . HYPERTENSION   . CAD, NATIVE VESSEL     PCI and DES to circumflex  . DVT   . Mixed hyperlipidemia   . PROSTATE CANCER   . GERD   . Prostate cancer   . MI (myocardial infarction)      Assessment: 78 year old man admitted for respiratory distress and positive troponin.  VQ scan shows high probability for PE. Will continue heparin gtt. Will need to be cautious with recent urological surgery and watch for s/s bleeding. Heparin level (<0.1) is below-goal on 1250 units/hr. No problem with line / infusion per RN.   Goal of Therapy:  Heparin level 0.3-0.7 units/ml Monitor platelets by anticoagulation protocol: Yes   Plan:  1. Heparin bolus of 3000 units x 1, then increase IV infusion to 1600 units/hr.  2. Heparin level with AM labs.  Emeline Gins 09/28/2011,10:59 PM

## 2011-09-29 ENCOUNTER — Encounter (HOSPITAL_COMMUNITY): Payer: Self-pay | Admitting: Physician Assistant

## 2011-09-29 DIAGNOSIS — C61 Malignant neoplasm of prostate: Secondary | ICD-10-CM

## 2011-09-29 DIAGNOSIS — I82409 Acute embolism and thrombosis of unspecified deep veins of unspecified lower extremity: Secondary | ICD-10-CM | POA: Diagnosis not present

## 2011-09-29 DIAGNOSIS — I2699 Other pulmonary embolism without acute cor pulmonale: Secondary | ICD-10-CM | POA: Diagnosis not present

## 2011-09-29 DIAGNOSIS — E782 Mixed hyperlipidemia: Secondary | ICD-10-CM

## 2011-09-29 DIAGNOSIS — I359 Nonrheumatic aortic valve disorder, unspecified: Secondary | ICD-10-CM | POA: Diagnosis not present

## 2011-09-29 DIAGNOSIS — I2510 Atherosclerotic heart disease of native coronary artery without angina pectoris: Secondary | ICD-10-CM

## 2011-09-29 LAB — BASIC METABOLIC PANEL
CO2: 24 mEq/L (ref 19–32)
Calcium: 9.8 mg/dL (ref 8.4–10.5)
Creatinine, Ser: 1.7 mg/dL — ABNORMAL HIGH (ref 0.50–1.35)
GFR calc non Af Amer: 37 mL/min — ABNORMAL LOW (ref >90)
Glucose, Bld: 122 mg/dL — ABNORMAL HIGH (ref 70–99)
Sodium: 138 mEq/L (ref 135–145)

## 2011-09-29 LAB — CBC
HCT: 43.9 % (ref 39.0–52.0)
MCH: 28.9 pg (ref 26.0–34.0)
MCV: 82.8 fL (ref 78.0–100.0)
Platelets: 157 10*3/uL (ref 150–400)
RBC: 5.3 MIL/uL (ref 4.22–5.81)

## 2011-09-29 LAB — HEPARIN LEVEL (UNFRACTIONATED): Heparin Unfractionated: 0.32 IU/mL (ref 0.30–0.70)

## 2011-09-29 MED ORDER — WARFARIN - PHARMACIST DOSING INPATIENT: Freq: Every day | Status: DC

## 2011-09-29 MED ORDER — LORAZEPAM 1 MG PO TABS: 1.0000 mg | ORAL_TABLET | Freq: Once | ORAL | Status: DC

## 2011-09-29 MED ORDER — COUMADIN BOOK
Freq: Once | Status: AC
  Administered 2011-09-30: 10:00:00
  Filled 2011-09-29 (×3): qty 1

## 2011-09-29 MED ORDER — HEPARIN BOLUS VIA INFUSION
3000.0000 [IU] | Freq: Once | INTRAVENOUS | Status: AC
  Administered 2011-09-29: 3000 [IU] via INTRAVENOUS
  Filled 2011-09-29: qty 3000

## 2011-09-29 MED ORDER — DOCUSATE SODIUM 100 MG PO CAPS
100.0000 mg | ORAL_CAPSULE | Freq: Two times a day (BID) | ORAL | Status: DC | PRN
  Administered 2011-09-29 – 2011-10-03 (×5): 100 mg via ORAL
  Filled 2011-09-29 (×5): qty 1

## 2011-09-29 MED ORDER — HEPARIN (PORCINE) IN NACL 100-0.45 UNIT/ML-% IJ SOLN
2100.0000 [IU]/h | INTRAMUSCULAR | Status: DC
  Administered 2011-09-29: 2000 [IU]/h via INTRAVENOUS
  Administered 2011-09-30: 2100 [IU]/h via INTRAVENOUS
  Filled 2011-09-29 (×3): qty 250

## 2011-09-29 MED ORDER — WARFARIN VIDEO
Freq: Once | Status: AC
  Administered 2011-09-30: 10:00:00

## 2011-09-29 MED ORDER — WARFARIN SODIUM 7.5 MG PO TABS
7.5000 mg | ORAL_TABLET | Freq: Once | ORAL | Status: AC
  Administered 2011-09-29: 7.5 mg via ORAL
  Filled 2011-09-29 (×2): qty 1

## 2011-09-29 MED ORDER — DIPHENHYDRAMINE HCL 25 MG PO CAPS
25.0000 mg | ORAL_CAPSULE | Freq: Every evening | ORAL | Status: DC | PRN
  Administered 2011-10-01 – 2011-10-03 (×4): 25 mg via ORAL
  Filled 2011-09-29 (×4): qty 1

## 2011-09-29 MED ORDER — DOCUSATE SODIUM 100 MG PO CAPS: 100.0000 mg | ORAL_CAPSULE | Freq: Two times a day (BID) | ORAL | Status: DC | PRN

## 2011-09-29 NOTE — Progress Notes (Signed)
  Echocardiogram 2D Echocardiogram has been performed.  [name] FRANCES 09/29/2011, 11:22 AM

## 2011-09-29 NOTE — Progress Notes (Signed)
ANTICOAGULATION CONSULT NOTE - Follow Up Consult  Pharmacy Consult for Heparin/Warfarin Indication: Pulmonary Embolism  Allergies  Allergen Reactions  . Darifenacin Hydrobromide Er Nausea Only    Dizziness    Patient Measurements: Height: 6\' 1"  (185.4 cm) Weight: 230 lb 9.6 oz (104.6 kg) IBW/kg (Calculated) : 79.9  Heparin Dosing Weight: 101kg  Vital Signs: Temp: 97.9 F (36.6 C) (08/23 2315) Temp src: Oral (08/23 2315) BP: 134/86 mmHg (08/23 2315) Pulse Rate: 73  (08/23 2315)  Labs:  Basename 09/29/11 2312 09/29/11 1310 09/29/11 0345 09/28/11 2138 09/28/11 1624 09/28/11 1508 09/28/11 1220  HGB -- -- 15.3 -- -- -- 17.6*  HCT -- -- 43.9 -- -- -- 48.9  PLT -- -- 157 -- -- -- 181  APTT -- -- -- -- -- 78* --  LABPROT -- -- -- -- -- 17.2* --  INR -- -- -- -- -- 1.38 --  HEPARINUNFRC 0.32 0.13* 0.26* -- -- -- --  CREATININE -- -- 1.70* -- -- -- 1.88*  CKTOTAL -- -- -- -- -- -- --  CKMB -- -- -- -- -- -- --  TROPONINI -- -- -- 0.35* 0.34* -- 0.32*    Estimated Creatinine Clearance: 45.5 ml/min (by C-G formula based on Cr of 1.7).   Medications:     . aspirin EC  81 mg Oral Daily  . coumadin book   Does not apply Once  . fenofibrate  160 mg Oral Daily  . heparin  3,000 Units Intravenous Once  . LORazepam  1 mg Oral Once  . multivitamin with minerals  1 tablet Oral Daily  . oxybutynin  5 mg Oral TID  . pantoprazole  40 mg Oral Daily  . simvastatin  10 mg Oral q1800  . vitamin B-12  1,000 mcg Oral Daily  . warfarin  7.5 mg Oral ONCE-1800  . warfarin   Does not apply Once  . Warfarin - Pharmacist Dosing Inpatient   Does not apply q1800    Assessment: 78 year old man admitted for respiratory distress and positive troponin on IV heparin / warfarin bridge. VQ scan suggests high probability of pulmonary embolism.  Heparin level (0.56) is at-goal on 2000 units/hr.   Goal of Therapy:  Heparin level 0.3-0.7 units/ml  Monitor platelets by anticoagulation protocol: yes      Plan:  1. Continue IV heparin at 2000 units/hr.  2. Follow-up AM labs.  [name] 09/29/2011,11:43 PM

## 2011-09-29 NOTE — Progress Notes (Signed)
ANTICOAGULATION CONSULT NOTE - Follow Up Consult  Pharmacy Consult for Heparin/Warfarin Indication: Pulmonary Embolism  Allergies  Allergen Reactions  . Darifenacin Hydrobromide Er Nausea Only    Dizziness    Patient Measurements: Height: 6\' 1"  (185.4 cm) Weight: 230 lb 9.6 oz (104.6 kg) IBW/kg (Calculated) : 79.9  Heparin Dosing Weight: 101kg  Vital Signs: Temp: 98.1 F (36.7 C) (08/23 1154) Temp src: Oral (08/23 1154) BP: 127/50 mmHg (08/23 1400) Pulse Rate: 82  (08/23 1400)  Labs:  Basename 09/29/11 1310 09/29/11 0345 09/28/11 2138 09/28/11 1624 09/28/11 1508 09/28/11 1220  HGB -- 15.3 -- -- -- 17.6*  HCT -- 43.9 -- -- -- 48.9  PLT -- 157 -- -- -- 181  APTT -- -- -- -- 78* --  LABPROT -- -- -- -- 17.2* --  INR -- -- -- -- 1.38 --  HEPARINUNFRC 0.13* 0.26* <0.10* -- -- --  CREATININE -- 1.70* -- -- -- 1.88*  CKTOTAL -- -- -- -- -- --  CKMB -- -- -- -- -- --  TROPONINI -- -- 0.35* 0.34* -- 0.32*    Estimated Creatinine Clearance: 45.5 ml/min (by C-G formula based on Cr of 1.7).   Medications:     . aspirin EC  81 mg Oral Daily  . fenofibrate  160 mg Oral Daily  . heparin  3,000 Units Intravenous Once  . multivitamin with minerals  1 tablet Oral Daily  . oxybutynin  5 mg Oral TID  . pantoprazole  40 mg Oral Daily  . simvastatin  10 mg Oral q1800  . vitamin B-12  1,000 mcg Oral Daily  . DISCONTD: aspirin  81 mg Oral Daily  . DISCONTD: B-12  1 capsule Oral Daily  . DISCONTD: multivitamin  1 capsule Oral Daily    Assessment: 78 year old man admitted for respiratory distress and positive troponin. VQ scan suggests high probability of pulmonary embolism.  Heparin level 1300 on 8/23 was 0.13, down from 0.26 at 0400 on 8/23. Hemoglobin/hematocrit and platelets are stable. Caution is advised due to recent urologic surgery.  Blood clots noted in urine, however is not considered significant.  There is no evidence of active bleeding.    Patient is to be bridged to  warfarin for pulmonary embolism treatment. INR on admission was 1.38. Warfarin score was calculated to be 6.   Goal of Therapy:  Heparin level 0.3-0.7 units/ml  Monitor platelets by anticoagulation protocol: yes    Plan:  Heparin bolus 3000 units x1 dose Increase heparin IV infusion to 2000units/hour Check heparin level in 8 hours  Initiate warfarin 7.5mg  today  Check INR levels daily   Micheline Chapman PharmD Candidate 09/29/2011,2:36 PM   Agree with above assessment and plan.   Link Snuffer, PharmD, BCPS Clinical Pharmacist [phone] 09/29/2011, 3:31 PM

## 2011-09-29 NOTE — Consult Note (Signed)
Cardiology Consult Note   Patient ID: [name] MRN: [id number], DOB/AGE: 06-25-1931   Admit date: 09/28/2011 Date of Consult: 09/29/2011  Primary Physician: Johny Blamer, MD Primary Cardiologist: Rollene Rotunda, MD  Reason for consult: elevated trop-I in the setting of bilateral pulmonary emboli  HPI: [name] is a 78yo male with PMHx significant for CAD (s/p DES-mid LCx 08/2009; 30% pLAD, 25% m/dLAD, 75% ostial D1, 99% mLCx s/p DES, 25-30% p/mRCA, 40% mRCA, 30% mPDA stenoses; LVEF 50%, inf hypokinesis), HTN, HL, GERD, history of LE DVT (2006 s/p initially Coumadinized, then IVC filter placed in 2006 with subsequent Coumadin discontinuation) and prostate cancer (s/p multiple urologic procedures, most recently 09/15/11 for hardware removal at [facility] and foley insertion) who was admitted to [facility] on 09/28/11 with abnormal VQ scan indicative of high probability, bilateral PE.  He tells me was taken off of Coumadin soon after IVC filter placement, and has not been on Coumadin for over 1 year. The patient reports experiencing sudden DOE and shortness of breath at rest beginning yesterday. He reports he ambulated several feet in the morning to give his dog a treat and became markedly short of breath. This persisted at rest, thus prompting his ED arrival. Leading up to this point, he reports no chest pain, exertional dyspnea, shortness of breath, lightheadedness, diaphoresis, n/v, palpitations, PND, orthopnea, LE edema or new cough. He states he has been doing well since undergoing PCI, and his activity has not been limited by symptoms. He denies unilateral leg swelling, redness or pain. No fevers or chills. He does report passing a few blood clots soon after his urologic procedure. Regarding this, the patient states he had a sphincter placed in the past which was removed due to erosion of the device on 09/15/11. A foley catheter was placed. He tolerated the procedure well. He has  been scheduled to undergo successive repeat procedures for re-implantation of the artificial sphincter in the future.  Upon ED arrival, EKG revealed sinus tachycardia, no ST-T wave changes. Unchanged from prior tracings. Trop-I returned mildly elevated at 0.32. CXR revealed no active disease. BMET revealed Cr 1.88 (baseline 1.18-1.32 in 2011), mild hypercalcemia at 10.7. CBC revealed a mild erythrocytosis at 17.6. No leukocytosis.  INR returned subtherapeutic at 1.38. VQ scan revealed segmental perfusion defects in R midlung, L midlung and L base indicative of high probability for PE. The patient was subsequently admitted to ICU by the pulmonary/CCM team. Heparin gtt was started with caution in the setting of the patient's recent urological surgery. The plan is to start Coumadin after 24 hours on heparin. He remained stable overnight with improvement in his renal function. Two subsequent sets of trop-I returned mildly elevated at 0.34 and 0.35. LE dopplers have been performed to evaluate for DVT, if negative, work-up for IVC filter thrombus will be pursued +/- revision/replacement. Cardiology consultation was personally requested by the family.   Problem List: Past Medical History  Diagnosis Date  . HYPERTENSION   . CAD, NATIVE VESSEL     PCI and DES to circumflex  . DVT   . Mixed hyperlipidemia   . PROSTATE CANCER   . GERD   . Prostate cancer   . MI (myocardial infarction)     Past Surgical History  Procedure Date  . Prostatectomy   . Knee surgery   . Hernia repair   . Vena cava filter placement      Allergies:  Allergies  Allergen Reactions  . Darifenacin Hydrobromide Er Nausea  Only    Dizziness    Home Medications: Prior to Admission medications   Medication Sig Start Date End Date Taking? Authorizing Provider  aspirin 81 MG tablet Take 81 mg by mouth daily.   Yes Historical Provider, MD  Cyanocobalamin (B-12) 1000 MCG CAPS 1 tab po qd    Yes Historical Provider, MD    Diphenhydramine-APAP, sleep, (EXCEDRIN PM PO) as needed.     Yes Historical Provider, MD  esomeprazole (NEXIUM) 40 MG capsule Take 40 mg by mouth daily before breakfast.     Yes Historical Provider, MD  fenofibrate 160 MG tablet ONE DAILY 05/19/11  Yes Rollene Rotunda, MD  Misc Natural Products (GLUCOSAMINE CHONDROITIN ADV PO) 1 tab po qd    Yes Historical Provider, MD  Multiple Vitamin (MULTIVITAMIN) capsule Take 1 capsule by mouth daily.     Yes Historical Provider, MD  nitroGLYCERIN (NITROSTAT) 0.4 MG SL tablet Place 0.4 mg under the tongue every 5 (five) minutes as needed.     Yes Historical Provider, MD  Omega-3 Fatty Acids (FISH OIL CONCENTRATE PO) Take 2 capsules by mouth daily. 1 tab po qd   Yes Historical Provider, MD  OVER THE COUNTER MEDICATION Take 1 tablet by mouth daily.   Yes Historical Provider, MD  oxybutynin (DITROPAN) 5 MG tablet Take 5 mg by mouth 3 (three) times daily.   Yes Historical Provider, MD  pravastatin (PRAVACHOL) 20 MG tablet TAKE 1 TABLET EVERY DAY 07/24/11  Yes Rollene Rotunda, MD    Inpatient Medications:     . aspirin  324 mg Oral Once  . aspirin EC  81 mg Oral Daily  . fenofibrate  160 mg Oral Daily  . heparin  3,000 Units Intravenous Once  . heparin  4,000 Units Intravenous Once  . multivitamin with minerals  1 tablet Oral Daily  . oxybutynin  5 mg Oral TID  . pantoprazole  40 mg Oral Daily  . simvastatin  10 mg Oral q1800  . sodium chloride  1,000 mL Intravenous Once  . vitamin B-12  1,000 mcg Oral Daily  . DISCONTD: aspirin  81 mg Oral Daily  . DISCONTD: B-12  1 capsule Oral Daily  . DISCONTD: multivitamin  1 capsule Oral Daily   Prescriptions prior to admission  Medication Sig Dispense Refill  . aspirin 81 MG tablet Take 81 mg by mouth daily.      . Cyanocobalamin (B-12) 1000 MCG CAPS 1 tab po qd       . Diphenhydramine-APAP, sleep, (EXCEDRIN PM PO) as needed.        Marland Kitchen esomeprazole (NEXIUM) 40 MG capsule Take 40 mg by mouth daily before  breakfast.        . fenofibrate 160 MG tablet ONE DAILY  90 tablet  4  . Misc Natural Products (GLUCOSAMINE CHONDROITIN ADV PO) 1 tab po qd       . Multiple Vitamin (MULTIVITAMIN) capsule Take 1 capsule by mouth daily.        . nitroGLYCERIN (NITROSTAT) 0.4 MG SL tablet Place 0.4 mg under the tongue every 5 (five) minutes as needed.        . Omega-3 Fatty Acids (FISH OIL CONCENTRATE PO) Take 2 capsules by mouth daily. 1 tab po qd      . OVER THE COUNTER MEDICATION Take 1 tablet by mouth daily.      Marland Kitchen oxybutynin (DITROPAN) 5 MG tablet Take 5 mg by mouth 3 (three) times daily.      Marland Kitchen  pravastatin (PRAVACHOL) 20 MG tablet TAKE 1 TABLET EVERY DAY  30 tablet  9    History reviewed. No pertinent family history.   History   Social History  . Marital Status: Married    Spouse Name: N/A    Number of Children: N/A  . Years of Education: N/A   Occupational History  . Not on file.   Social History Main Topics  . Smoking status: Former Smoker    Quit date: 04/27/1961  . Smokeless tobacco: Not on file  . Alcohol Use: No  . Drug Use: Not on file  . Sexually Active: Not on file   Other Topics Concern  . Not on file   Social History Narrative  . No narrative on file     Review of Systems: General: negative for chills, fever, night sweats or weight changes.  Cardiovascular: positive for DOE and shortness of breath, negative for chest pain, edema, orthopnea, palpitations, paroxysmal nocturnal dyspnea  Dermatological: negative for rash Respiratory: negative for cough or wheezing Urologic: negative for hematuria Abdominal: negative for nausea, vomiting, diarrhea, bright red blood per rectum, melena, or hematemesis Neurologic: negative for visual changes, syncope, or dizziness All other systems reviewed and are otherwise negative except as noted above.  Physical Exam: Blood pressure 111/76, pulse 63, temperature 97.8 F (36.6 C), temperature source Oral, resp. rate 21, height 6\' 1"  (1.854  m), weight 104.6 kg (230 lb 9.6 oz), SpO2 95.00%.    General: Well developed, well nourished, in no acute distress. Head: Normocephalic, atraumatic, sclera non-icteric, no xanthomas, nares are without discharge.  Neck: Negative for carotid bruits. JVD not elevated. Lungs: Clear bilaterally to auscultation without wheezes, rales, or rhonchi. Breathing is unlabored. Heart: RRR with S1 S2. No murmurs, rubs, or gallops appreciated. Abdomen: Soft, non-tender, non-distended with normoactive bowel sounds. No hepatomegaly. No rebound/guarding. No obvious abdominal masses. Msk:  Strength and tone appears normal for age. Extremities: No clubbing, cyanosis or edema.  Distal pedal pulses are 2+ and equal bilaterally. Neuro: Alert and oriented X 3. Moves all extremities spontaneously. Psych:  Responds to questions appropriately with a normal affect. GU: follow catheter noted; no obvious deformity or bleeding  Labs: Recent Labs  [facility] 09/29/11 0345 09/28/11 1220   WBC 8.3 9.4   HGB 15.3 17.6*   HCT 43.9 48.9   MCV 82.8 82.2   PLT 157 181   Lab 09/29/11 0345 09/28/11 1220  NA 138 139  K 3.9 4.4  CL 104 106  CO2 24 20  BUN 29* 30*  CREATININE 1.70* 1.88*  CALCIUM 9.8 10.7*  PROT -- --  BILITOT -- --  ALKPHOS -- --  ALT -- --  AST -- --  AMYLASE -- --  LIPASE -- --  GLUCOSE 122* 128*   Recent Labs  Basename 09/28/11 2138 09/28/11 1624 09/28/11 1220   CKTOTAL -- -- --   CKMB -- -- --   CKMBINDEX -- -- --   TROPONINI 0.35* 0.34* 0.32*   Radiology/Studies: Dg Chest 2 View  09/28/2011  *RADIOLOGY REPORT*  Clinical Data: Respiratory distress  CHEST - 2 VIEW  Comparison: 08/25/2009  Findings: Cardiomediastinal silhouette is stable.  No acute infiltrate or pleural effusion.  No pulmonary edema.  Surgical clips in the left axilla again noted.  Bony thorax is stable.  IMPRESSION: No active disease.  No significant change.   Original Report Authenticated By: Natasha Mead, M.D.    Nm Pulmonary  Perf And Vent  09/28/2011  *RADIOLOGY REPORT*  Clinical Data: Evaluate for pulmonary embolus  NM PULMONARY VENTILATION AND PERFUSION SCAN  Radiopharmaceutical: CURIE MAA TECHNETIUM TO 63M ALBUMIN AGGREGATED, CURIE xenon xe 133 gas 16 milli Curie XENON XE 133 GAS  Comparison: Chest radiograph from today  Findings: On the ventilation portion of the examination there is mild xenon tracer retention within both upper lobes.  On the perfusion portion of the examination there is a large segmental perfusion defects within the right midlung.  Multiple large segmental perfusion defects are noted in the left midlung and left base.  These are unmatched when compared with the ventilation images.  IMPRESSION:  1.  Examination is high probability for acute pulmonary embolus.  Critical Value/emergent results were called by telephone at the time of interpretation on 09/28/2011 at 4:16 pm to Dr. Oletta Lamas, who verbally acknowledged these results.   Original Report Authenticated By: Rosealee Albee, M.D.     EKG: sinus tachycardia, 113 bpm, LVH, no evidence of S1, Q3, T3; no ST-T wave changes; consistent with prior tracings  ASSESSMENT:  1. Bilateral PE -- in the setting IVC filter, Coumadin to be resumed this admission 2. Elevated troponin-I -- Mild; flat trend; 0.32>0.34>0.35 3. CAD 4. HTN 5. HL 6. Prostate cancer  DISCUSSION/PLAN:  The patient notes sudden onset of shortness of breath and dyspnea on exertion the date of admission. From a cardiac perspective, he has followed up routinely with Dr. Antoine Poche, most recently in 04/2011, and denied cardiovascular symptoms. He was deemed stable from a CAD perspective, and aggressive risk factor reduction was pursed. In fact, he was cleared for a planned urologic procedure shortly thereafter. On speaking with the patient, he does report being in his USOH and not limited by ischemic or heart failure-type symptoms since his PCI in 2011. He has actively pursued  cardiac risk factor reduction. There were no worsening symptoms leading up to yesterday's dyspneic episode. There was no endorsement of chest pain. High probability of PE was noted on VQ scan after ED arrival. Trop-I returned elevated. EKG non-ischemic. This is likely in the setting of strained RV function as he does not endorse worsening cardiac symptoms up to this point. Clinical picture and objective data do not support an ischemic etiology. He does have a history of prostate cancer and has been fairly immobilized recently due to his urologic procedures thereby supporting hypercoagulability. He does have a prior history of DVT. Preliminary LE dopplers reveal no evidence of DVT, however the distal left femoral vein was not visualized. IVC filter was placed in 2006, and the patient has been off of Coumadin for > 1 year. The plan is to re-evaluate the functionality of the filter, and start Coumadin soon. He will need to be on Coumadin indefinitely. He understands that he will need to follow-up in the Coumadin clinic regularly and will need to adhere to certain dietary restrictions. Future urologic procedures will need to account for the patient's anticoagulation as well. This was discussed with the patient and his wife who understand. Continue current cardiac meds including ASA, statin, fibrate. ARB and BB had been discontinued in the past, shortly after discharge for PCI, due to associated orthostatic hypotension, dizziness and 1st degree AV block. He had initially been on Lopressor BID, then daily, but developed 1st degree AVB, and this was eventually stopped. His symptoms improved and AVB resolved. Will hold on starting either of these this admission. 2D echo performed, will review.     Signed, R. Hurman Horn, PA-C 09/29/2011, 10:44 AM  I have taken a history, reviewed medications, allergies, PMH, SH, FH, and reviewed ROS and examined the patient.  I agree with the assessment and plan.  Fritzie Prioleau C. Daleen Squibb,  MD, [facility] Pager:  [phone]   Discussed clinical assessment and plan at length with patient and family. Have recommended life long Coumadin or anticoagulation. Will need to postpone bladder intervention at [facility] for at least 3 months. Nickalas Mccarrick C. Daleen Squibb, MD, [facility] Pager:  [phone]

## 2011-09-29 NOTE — Progress Notes (Signed)
Name: [name] MRN: [id number] DOB: May 05, 1931    LOS: 1   Primary Urologist:  Dr. Myrtie Hawk [phone] PCP - [facility] Pulmonary / Critical Care Note   History of Present Illness: 78 y/o M with PMH of HTN, CAD s/p MI with DES to circumflex, HLD, DVT LE in 2006 s/p 1 year coumadin / IVC filter placement and prostate cancer in 2006 s/p multiple urologic procedures, most recent 09/15/11 with removal of hardware and placement of foley catheter, presented to [facility] ED 8/22 with a two day history of shortness of breath with acute worsening day of presentation.  ER evaluation demonstrated mildly elevated troponin and VQ scan with high probability of PE.  Rx'd with heparin gtt and PCCM called for admit.    Denies recent long travel.  Wears leg bag with tight straps on R leg.  Denies chest pain, lower extremity swelling, cough, syncope, dizziness, diaphoresis.    Tests / Events: 8/22 2D ECHO>>>  doppler >>  Vital Signs: Temp:  [97.5 F (36.4 C)-97.9 F (36.6 C)] 97.8 F (36.6 C) (08/23 0755) Pulse Rate:  [49-115] 70  (08/23 0700) Resp:  [11-26] 22  (08/23 0700) BP: (86-151)/(58-91) 115/82 mmHg (08/23 0700) SpO2:  [91 %-99 %] 95 % (08/23 0700) FiO2 (%):  [2 %] 2 % (08/22 1632) Weight:  [104.6 kg (230 lb 9.6 oz)-107.502 kg (237 lb)] 104.6 kg (230 lb 9.6 oz) (08/23 0500) I/O last 3 completed shifts: In: 491.5 [P.O.:240; I.V.:251.5] Out: 416 [Urine:415; Stool:1]  Physical Examination: General: wdwn adult male in NAD Neuro: AAOx4, speech clear, MAE CV: s1s2 rrr, no m/r/g, no jvd PULM: resp's even/non-labored, lungs bilaterally diminished but clear GI: round/soft, bsx4 active GU:  Indwelling catheter, suprapubic surgical incision c/d/i, edges well approximated. No erythema or drainage. Extremities: warm/dry, no edema, negative homans    Ventilator settings: Vent Mode:  [-]  FiO2 (%):  [2 %] 2 %  Labs    CBC  Lab 09/29/11 0345 09/28/11 1220  HGB 15.3 17.6*    HCT 43.9 48.9  WBC 8.3 9.4  PLT 157 181   BMET  Lab 09/29/11 0345 09/28/11 1220  NA 138 139  K 3.9 4.4  CL 104 106  CO2 24 20  GLUCOSE 122* 128*  BUN 29* 30*  CREATININE 1.70* 1.88*  CALCIUM 9.8 10.7*  MG -- --  PHOS -- --    Lab 09/28/11 1508  INR 1.38    No results found for this basename: PHART:5,PCO2:5,PCO2ART:5,PO2:5,PO2ART:5,HCO3:5,TCO2:5,O2SAT:5 in the last 168 hours   Radiology: 8/22 CXR>>>Cardiomediastinal silhouette is stable. No acute infiltrate or pleural effusion. No pulmonary edema. Surgical clips in the left axilla again noted. Bony thorax is stable.  Assessment and Plan: Principal Problem:  *Pulmonary embolism Active Problems:  PROSTATE CANCER  HYPERTENSION  CAD, NATIVE VESSEL  GERD  Mixed hyperlipidemia   Acute Pulmonary Embolism Assessment:  Hx of DVT in 2006, s/p 1 year of coumadin and IVC filter placement.  Defects noted bilaterally on VQ scan consistent with high probability of PE.   Plan: -Transfer to SDU. -IV heparin gtt-->caution with recent urological surgery. -Cardiac enzymes positive but not rising, will call Dr. Claudie Fisherman. -LE doppler's, if negative may need to further investigate IVC filter for clot -Will begin coumadin after 24 hours of heparin.  HTN HLD Hx of CAD Assessment: Known HTN, HLD, Hx of MI with DES to circumflex  Plan: -Continue home medications. -Monitor BP.  GERD Assessment: chronic / on Nexium as  outpatient  Plan: -PPI.  Hx of Prostate Cancer Assessment: s/p multiple urological procedures, most recent for 'hardware' removal and placement of foley catheter to allow healing of erosion related to balloon.   Plan: - watch for hematuria given recent surgery -Will need to call [facility] urology urology on Monday attempted on Friday morning but no response.  Will transfer to SDU.  Alyson Reedy, M.D. [facility] Pulmonary/Critical Care Medicine. Pager: [phone]. After hours pager: [phone].

## 2011-09-29 NOTE — Progress Notes (Signed)
ANTICOAGULATION CONSULT NOTE - Follow Up Consult  Pharmacy Consult for Heparin Indication: VQ scan shows high probability for PE Chief Complaint  Patient presents with  . Respiratory Distress   Allergies  Allergen Reactions  . Darifenacin Hydrobromide Er Nausea Only    Dizziness   Patient Measurements: Height: 6\' 1"  (185.4 cm) Weight: 233 lb 7.5 oz (105.9 kg) IBW/kg (Calculated) : 79.9  Heparin Dosing Weight: 102 kg  Vital Signs: Temp: 97.9 F (36.6 C) (08/23 0431) Temp src: Oral (08/23 0431) BP: 135/79 mmHg (08/23 0400) Pulse Rate: 71  (08/23 0400)  Labs:  Basename 09/29/11 0345 09/28/11 2138 09/28/11 1624 09/28/11 1508 09/28/11 1220  HGB 15.3 -- -- -- 17.6*  HCT 43.9 -- -- -- 48.9  PLT 157 -- -- -- 181  APTT -- -- -- 78* --  LABPROT -- -- -- 17.2* --  INR -- -- -- 1.38 --  HEPARINUNFRC 0.26* <0.10* -- -- --  CREATININE 1.70* -- -- -- 1.88*  CKTOTAL -- -- -- -- --  CKMB -- -- -- -- --  TROPONINI -- 0.35* 0.34* -- 0.32*   Estimated Creatinine Clearance: 45.7 ml/min (by C-G formula based on Cr of 1.7).  Medical History: Past Medical History  Diagnosis Date  . HYPERTENSION   . CAD, NATIVE VESSEL     PCI and DES to circumflex  . DVT   . Mixed hyperlipidemia   . PROSTATE CANCER   . GERD   . Prostate cancer   . MI (myocardial infarction)    Assessment: 78 year old man admitted for respiratory distress and positive troponin.  VQ scan shows high probability for PE. Will need to be cautious with recent urological surgery and watch for s/s bleeding. Heparin level (0.26) is slightly below-goal on 1600 units/hr, but level drawn likely before steady-state reached for heparin dosing.    Goal of Therapy:  Heparin level 0.3-0.7 units/ml Monitor platelets by anticoagulation protocol: Yes   Plan:  1. Increase IV heparin to 1700 units/hr.  2. Heparin level in 8 hours.   Emeline Gins 09/29/2011,4:53 AM

## 2011-09-29 NOTE — Evaluation (Signed)
Physical Therapy Evaluation Patient Details Name: [name] MRN: [id number] DOB: February 10, 1931 Today's Date: 09/29/2011 Time: 1478-2956 PT Time Calculation (min): 25 min  PT Assessment / Plan / Recommendation Clinical Impression  pt presents with PE and history of catheter for past month until next prostate surgery.  pt notes urinary incontinence leaks around cath at times.  pt plans on D/C to home with wife who can take time off of work to A pt.  pt may benefit from OPPT at D/C pending progress.      PT Assessment  Patient needs continued PT services    Follow Up Recommendations  Outpatient PT;Supervision - Intermittent    Barriers to Discharge None      Equipment Recommendations  None recommended by PT    Recommendations for Other Services OT consult   Frequency Min 3X/week    Precautions / Restrictions Precautions Precautions: None;Fall Restrictions Weight Bearing Restrictions: No   Pertinent Vitals/Pain Denies pain      Mobility  Bed Mobility Bed Mobility: Supine to Sit;Sitting - Scoot to Edge of Bed Supine to Sit: 5: Supervision Sitting - Scoot to Edge of Bed: 5: Supervision Details for Bed Mobility Assistance: pt demos good technique.  Only A with lines Transfers Transfers: Sit to Stand;Stand to Sit;Stand Pivot Transfers Sit to Stand: 5: Supervision;With upper extremity assist;From bed Stand to Sit: 5: Supervision;With upper extremity assist;To chair/3-in-1 Stand Pivot Transfers: 4: Min guard Details for Transfer Assistance: pt demoes good safety and with mild SOB.   Ambulation/Gait Ambulation/Gait Assistance: Not tested (comment) Stairs: No Wheelchair Mobility Wheelchair Mobility: No    Exercises     PT Diagnosis: Difficulty walking (Decondition/Debility)  PT Problem List: Decreased activity tolerance;Decreased balance;Decreased mobility;Decreased knowledge of use of DME;Cardiopulmonary status limiting activity PT Treatment Interventions: DME  instruction;Gait training;Stair training;Functional mobility training;Therapeutic activities;Therapeutic exercise;Balance training;Patient/family education   PT Goals Acute Rehab PT Goals PT Goal Formulation: With patient Time For Goal Achievement: 10/13/11 Potential to Achieve Goals: Good Pt will go Supine/Side to Sit: Independently PT Goal: Supine/Side to Sit - Progress: Goal set today Pt will go Sit to Supine/Side: Independently PT Goal: Sit to Supine/Side - Progress: Goal set today Pt will go Sit to Stand: with modified independence PT Goal: Sit to Stand - Progress: Goal set today Pt will go Stand to Sit: with modified independence PT Goal: Stand to Sit - Progress: Goal set today Pt will Go Up / Down Stairs: 3-5 stairs;with supervision;with rail(s) PT Goal: Up/Down Stairs - Progress: Goal set today  Visit Information  Last PT Received On: 09/29/11 Assistance Needed: +1    Subjective Data  Subjective: I'm so glad to get up.   Patient Stated Goal: Golf   Prior Functioning  Home Living Lives With: Spouse Available Help at Discharge: Family;Available 24 hours/day Type of Home: House Home Access: Stairs to enter [employer] of Steps: 4 Entrance Stairs-Rails: Can reach both Home Layout: One level (doesn't go to second level) Bathroom Shower/Tub: Walk-in shower;Door [employer] Toilet: Standard Bathroom Accessibility: Yes How Accessible: Accessible via walker Home Adaptive Equipment: Walker - rolling;Bedside commode/3-in-1;Reacher Prior Function Level of Independence: Independent Able to Take Stairs?: Yes Driving: Yes Vocation: Retired Comments: golf(2 x a week) and working out(5x a week) Musician: No difficulties    Cognition  Overall Cognitive Status: Appears within functional limits for tasks assessed/performed Arousal/Alertness: Awake/alert Orientation Level: Oriented X4 / Intact Behavior During Session: [facility] for tasks performed      Extremity/Trunk Assessment Right Lower Extremity Assessment RLE  ROM/Strength/Tone: [facility] for tasks assessed RLE Sensation: WFL - Light Touch Left Lower Extremity Assessment LLE ROM/Strength/Tone: WFL for tasks assessed LLE Sensation: WFL - Light Touch Trunk Assessment Trunk Assessment: Normal   Balance Balance Balance Assessed: Yes Dynamic Standing Balance Dynamic Standing - Balance Support: Left upper extremity supported;Right upper extremity supported (Unilateral UE support) Dynamic Standing - Level of Assistance: 4: Min assist Dynamic Standing - Comments: pt needs 1 UE support and MinGuarding to maintain balance.  pt able to stand to don/doff underwear and stood ~74mins.  O2 sats began to decrease with increased time in standing to upper 80's.    End of Session PT - End of Session Equipment Utilized During Treatment: Gait belt Activity Tolerance: Patient tolerated treatment well Patient left: in chair;with call bell/phone within reach Nurse Communication: Mobility status  GP     Sunny Schlein,  [phone] 09/29/2011, 1:49 PM

## 2011-09-29 NOTE — Progress Notes (Signed)
*  Preliminary Results* Bilateral lower extremity venous duplex completed. Bilateral lower extremities are negative for deep vein thrombosis. Unable to visualize the distal left femoral vein, therefore deep vein thrombosis cannot be excluded in this segment. Preliminary results discussed with RN Gearldine Bienenstock.  09/29/2011 10:52 AM Gertie Fey, RDMS, RDCS

## 2011-09-29 NOTE — Progress Notes (Signed)
Pt transferred from 2100.  NAD noted.  SOB with exertion, but O2 sats WNL.  Oriented to rm and callbell within reach.  Denies any c/o pain.

## 2011-09-29 NOTE — Progress Notes (Signed)
Utilization Review Completed.Harleen Fineberg T8/23/2013   

## 2011-09-30 DIAGNOSIS — C61 Malignant neoplasm of prostate: Secondary | ICD-10-CM | POA: Diagnosis not present

## 2011-09-30 DIAGNOSIS — I82409 Acute embolism and thrombosis of unspecified deep veins of unspecified lower extremity: Secondary | ICD-10-CM | POA: Diagnosis not present

## 2011-09-30 DIAGNOSIS — R0902 Hypoxemia: Secondary | ICD-10-CM | POA: Diagnosis not present

## 2011-09-30 DIAGNOSIS — E782 Mixed hyperlipidemia: Secondary | ICD-10-CM | POA: Diagnosis not present

## 2011-09-30 DIAGNOSIS — I2510 Atherosclerotic heart disease of native coronary artery without angina pectoris: Secondary | ICD-10-CM | POA: Diagnosis not present

## 2011-09-30 DIAGNOSIS — J984 Other disorders of lung: Secondary | ICD-10-CM | POA: Diagnosis not present

## 2011-09-30 DIAGNOSIS — I2699 Other pulmonary embolism without acute cor pulmonale: Secondary | ICD-10-CM | POA: Diagnosis not present

## 2011-09-30 DIAGNOSIS — I10 Essential (primary) hypertension: Secondary | ICD-10-CM

## 2011-09-30 LAB — CBC
MCH: 28.9 pg (ref 26.0–34.0)
MCV: 82.8 fL (ref 78.0–100.0)
Platelets: 156 10*3/uL (ref 150–400)
RBC: 5.46 MIL/uL (ref 4.22–5.81)
RDW: 14.4 % (ref 11.5–15.5)

## 2011-09-30 LAB — BASIC METABOLIC PANEL
CO2: 21 mEq/L (ref 19–32)
Calcium: 9.7 mg/dL (ref 8.4–10.5)
Creatinine, Ser: 1.5 mg/dL — ABNORMAL HIGH (ref 0.50–1.35)
GFR calc Af Amer: 50 mL/min — ABNORMAL LOW (ref >90)
GFR calc non Af Amer: 43 mL/min — ABNORMAL LOW (ref >90)
Sodium: 136 mEq/L (ref 135–145)

## 2011-09-30 LAB — PHOSPHORUS: Phosphorus: 3 mg/dL (ref 2.3–4.6)

## 2011-09-30 LAB — MAGNESIUM: Magnesium: 2 mg/dL (ref 1.5–2.5)

## 2011-09-30 LAB — HEPARIN LEVEL (UNFRACTIONATED): Heparin Unfractionated: 0.15 IU/mL — ABNORMAL LOW (ref 0.30–0.70)

## 2011-09-30 MED ORDER — WARFARIN SODIUM 7.5 MG PO TABS
7.5000 mg | ORAL_TABLET | Freq: Once | ORAL | Status: AC
  Administered 2011-09-30: 7.5 mg via ORAL
  Filled 2011-09-30: qty 1

## 2011-09-30 MED ORDER — HEPARIN (PORCINE) IN NACL 100-0.45 UNIT/ML-% IJ SOLN
2600.0000 [IU]/h | INTRAMUSCULAR | Status: DC
  Administered 2011-09-30 – 2011-10-01 (×4): 2400 [IU]/h via INTRAVENOUS
  Administered 2011-10-02: 2600 [IU]/h via INTRAVENOUS
  Filled 2011-09-30 (×10): qty 250

## 2011-09-30 NOTE — Progress Notes (Signed)
Subjective:  No chest pain. No dyspnea. Rhythm NSR.  Objective:  Vital Signs in the last 24 hours: Temp:  [97.7 F (36.5 C)-98.2 F (36.8 C)] 98.1 F (36.7 C) (08/24 1113) Pulse Rate:  [59-82] 75  (08/24 0738) Resp:  [17-24] 23  (08/24 0738) BP: (94-139)/(50-90) 129/85 mmHg (08/24 0738) SpO2:  [95 %-98 %] 95 % (08/24 0738)  Intake/Output from previous day: 08/23 0701 - 08/24 0700 In: 902 [P.O.:800; I.V.:102] Out: 1590 [Urine:1590] Intake/Output from this shift: Total I/O In: -  Out: 350 [Urine:350]     . aspirin EC  81 mg Oral Daily  . coumadin book   Does not apply Once  . fenofibrate  160 mg Oral Daily  . heparin  3,000 Units Intravenous Once  . LORazepam  1 mg Oral Once  . multivitamin with minerals  1 tablet Oral Daily  . oxybutynin  5 mg Oral TID  . pantoprazole  40 mg Oral Daily  . simvastatin  10 mg Oral q1800  . vitamin B-12  1,000 mcg Oral Daily  . warfarin  7.5 mg Oral ONCE-1800  . warfarin  7.5 mg Oral ONCE-1800  . warfarin   Does not apply Once  . Warfarin - Pharmacist Dosing Inpatient   Does not apply q1800      . heparin 2,400 Units/hr (09/30/11 1045)  . DISCONTD: heparin 1,700 Units/hr (09/29/11 1300)  . DISCONTD: heparin 2,100 Units/hr (09/30/11 0700)    Physical Exam: The patient appears to be in no distress.  Head and neck exam reveals that the pupils are equal and reactive.  The extraocular movements are full.  There is no scleral icterus.  Mouth and pharynx are benign.  No lymphadenopathy.  No carotid bruits.  The jugular venous pressure is normal.  Thyroid is not enlarged or tender.  Chest is clear to percussion and auscultation.  No rales or rhonchi.  Expansion of the chest is symmetrical.  Heart reveals no abnormal lift or heave.  First and second heart sounds are normal.  There is no  gallop rub or click. Grade2/6 systolic ejection murmur at LSE.  The abdomen is soft and nontender.  Bowel sounds are normoactive.  There is no  hepatosplenomegaly or mass.  There are no abdominal bruits.  Extremities reveal no phlebitis or edema.  Pedal pulses are good.  There is no cyanosis or clubbing.  Neurologic exam is normal strength and no lateralizing weakness.  No sensory deficits.  Integument reveals no rash  Lab Results:  Basename 09/30/11 0400 09/29/11 0345  WBC 7.8 8.3  HGB 15.8 15.3  PLT 156 157    Basename 09/30/11 0400 09/29/11 0345  NA 136 138  K 4.0 3.9  CL 103 104  CO2 21 24  GLUCOSE 110* 122*  BUN 25* 29*  CREATININE 1.50* 1.70*    Basename 09/28/11 2138 09/28/11 1624  TROPONINI 0.35* 0.34*   Hepatic Function Panel No results found for this basename: PROT,ALBUMIN,AST,ALT,ALKPHOS,BILITOT,BILIDIR,IBILI in the last 72 hours No results found for this basename: CHOL in the last 72 hours No results found for this basename: PROTIME in the last 72 hours  Imaging: Imaging results have been reviewed  Cardiac Studies: Pt. Status: Room: 2101  PERFORMING [place], [facility] SONOGRAPHER [place], [facility] ADMITTING Cyril Mourning ATTENDING Cyril Mourning Skip Mayer, Brandi cc:  ------------------------------------------------------------ LV EF: 55% - 60%  ------------------------------------------------------------ Indications: Pulmonary embolus 415.19.  ------------------------------------------------------------ History: PMH: Coronary artery disease. Risk factors: Former tobacco use. Hypertension. Dyslipidemia.  ------------------------------------------------------------ Study  Conclusions  - Left ventricle: The cavity size was normal. Wall thickness was increased in a pattern of mild LVH. Systolic function was normal. The estimated ejection fraction was in the range of 55% to 60%. Regional wall motion abnormalities cannot be excluded. Doppler parameters are consistent with abnormal left ventricular relaxation (grade 1 diastolic dysfunction). - Ventricular septum: Septal motion  showed paradox. - Aortic valve: There was very mild stenosis. Trivial regurgitation. Valve area: 1.32cm^2(VTI). Valve area: 1.27cm^2 (Vmax). - Right ventricle: Systolic function was mildly reduced. - Right atrium: The atrium was mildly dilated. - Atrial septum: There was increased thickness of the septum, consistent with lipomatous hypertrophy. Transthoracic echocardiography. M-mode, complete 2D, spectral Doppler, and color Doppler. Height: Height: 185.4cm. Height: 73in. Weight: Weight: 104.6kg. Weight: 230.1lb. Body mass index: BMI: 30.4kg/m^2. Body surface area: BSA: 2.39m^2. Blood pressure: 138/113. Patient status: Inpatient. Location: ICU/CCU   Assessment/Plan:  Patient Active Hospital Problem List: Pulmonary embolism (09/28/2011)   Assessment: Clinically stable   Plan: Continue heparin, warfarin.   CAD, NATIVE VESSEL (09/28/2009)   Assessment: No angina   Plan: Continue ASA, Statin.  Not presently on BB, had problems in past with hypotension and AV block.   Will recheck EKG. EF by echo 55-60 %    LOS: 2 days    Cassell Clement 09/30/2011, 11:19 AM

## 2011-09-30 NOTE — Progress Notes (Signed)
ANTICOAGULATION CONSULT NOTE - Follow Up Consult  Pharmacy Consult for heparin/warfarin Indication: pulmonary embolus  Allergies  Allergen Reactions  . Darifenacin Hydrobromide Er Nausea Only    Dizziness    Patient Measurements: Height: 6\' 1"  (185.4 cm) Weight: 230 lb 9.6 oz (104.6 kg) IBW/kg (Calculated) : 79.9  Heparin Dosing Weight: 101.3kg  Vital Signs: Temp: 98.4 F (36.9 C) (08/24 1539) Temp src: Oral (08/24 1539) BP: 106/68 mmHg (08/24 1955) Pulse Rate: 82  (08/24 1955)  Labs:  Basename 09/30/11 1940 09/30/11 0900 09/30/11 0400 09/29/11 2312 09/29/11 0345 09/28/11 2138 09/28/11 1624 09/28/11 1508 09/28/11 1220  HGB -- -- 15.8 -- 15.3 -- -- -- --  HCT -- -- 45.2 -- 43.9 -- -- -- 48.9  PLT -- -- 156 -- 157 -- -- -- 181  APTT -- -- -- -- -- -- -- 78* --  LABPROT -- -- 15.3* -- -- -- -- 17.2* --  INR -- -- 1.18 -- -- -- -- 1.38 --  HEPARINUNFRC 0.52 0.15* -- 0.32 -- -- -- -- --  CREATININE -- -- 1.50* -- 1.70* -- -- -- 1.88*  CKTOTAL -- -- -- -- -- -- -- -- --  CKMB -- -- -- -- -- -- -- -- --  TROPONINI -- -- -- -- -- 0.35* 0.34* -- 0.32*    Estimated Creatinine Clearance: 51.6 ml/min (by C-G formula based on Cr of 1.5).   Medications:  Scheduled:     . aspirin EC  81 mg Oral Daily  . coumadin book   Does not apply Once  . fenofibrate  160 mg Oral Daily  . LORazepam  1 mg Oral Once  . multivitamin with minerals  1 tablet Oral Daily  . oxybutynin  5 mg Oral TID  . pantoprazole  40 mg Oral Daily  . simvastatin  10 mg Oral q1800  . vitamin B-12  1,000 mcg Oral Daily  . warfarin  7.5 mg Oral ONCE-1800  . warfarin   Does not apply Once  . Warfarin - Pharmacist Dosing Inpatient   Does not apply q1800      . heparin 2,400 Units/hr (09/30/11 2000)  . DISCONTD: heparin 2,100 Units/hr (09/30/11 0700)     Assessment: 63 YOM admitted for respiratory distress with a VQ scan highly probably for PE. Was given a bolus dose of heparin of 4000units and then  started on an infusion at 1250units/hr. This has been increased during his stay due to inadequate levels and he has now reached a therapeutic Heparin level.  Goal of Therapy:  Heparin level 0.3-0.7units/mL INR 2-3  Plan:  1. Continue heparin at 2400units/hr  2. Check heparin level and CBC with AM labs  Estella Husk, Pharm.D., BCPS Clinical Pharmacist  Phone [phone] Pager [phone] 09/30/2011, 8:49 PM

## 2011-09-30 NOTE — Progress Notes (Signed)
ANTICOAGULATION CONSULT NOTE - Follow Up Consult  Pharmacy Consult for heparin/warfarin Indication: pulmonary embolus  Allergies  Allergen Reactions  . Darifenacin Hydrobromide Er Nausea Only    Dizziness    Patient Measurements: Height: 6\' 1"  (185.4 cm) Weight: 230 lb 9.6 oz (104.6 kg) IBW/kg (Calculated) : 79.9  Heparin Dosing Weight: 101.3kg  Vital Signs: Temp: 97.7 F (36.5 C) (08/24 0739) Temp src: Oral (08/24 0739) BP: 129/85 mmHg (08/24 0738) Pulse Rate: 75  (08/24 0738)  Labs:  Basename 09/30/11 0900 09/30/11 0400 09/29/11 2312 09/29/11 1310 09/29/11 0345 09/28/11 2138 09/28/11 1624 09/28/11 1508 09/28/11 1220  HGB -- 15.8 -- -- 15.3 -- -- -- --  HCT -- 45.2 -- -- 43.9 -- -- -- 48.9  PLT -- 156 -- -- 157 -- -- -- 181  APTT -- -- -- -- -- -- -- 78* --  LABPROT -- 15.3* -- -- -- -- -- 17.2* --  INR -- 1.18 -- -- -- -- -- 1.38 --  HEPARINUNFRC 0.15* -- 0.32 0.13* -- -- -- -- --  CREATININE -- 1.50* -- -- 1.70* -- -- -- 1.88*  CKTOTAL -- -- -- -- -- -- -- -- --  CKMB -- -- -- -- -- -- -- -- --  TROPONINI -- -- -- -- -- 0.35* 0.34* -- 0.32*    Estimated Creatinine Clearance: 51.6 ml/min (by C-G formula based on Cr of 1.5).   Medications:  Scheduled:    . aspirin EC  81 mg Oral Daily  . coumadin book   Does not apply Once  . fenofibrate  160 mg Oral Daily  . heparin  3,000 Units Intravenous Once  . LORazepam  1 mg Oral Once  . multivitamin with minerals  1 tablet Oral Daily  . oxybutynin  5 mg Oral TID  . pantoprazole  40 mg Oral Daily  . simvastatin  10 mg Oral q1800  . vitamin B-12  1,000 mcg Oral Daily  . warfarin  7.5 mg Oral ONCE-1800  . warfarin   Does not apply Once  . Warfarin - Pharmacist Dosing Inpatient   Does not apply q1800    Assessment: 17 YOM admitted for respiratory distress with a VQ scan highly probably for PE. Was given a bolus dose of heparin of 4000units and then started on an infusion at 1250units/hr. This has been increased  during his stay due to inadequate levels. Heparin level was 0.32 on 8/23 in the evening, and heparin was increased to 2100units/hr. Re-check of heparin level this morning was subtherapeutic at 0.15units/mL. Confirmed with nurse that the infusion was not stopped, and there were no issues with the IV site. INR was 1.18 this morning after getting warfarin 7.5mg  PO last evening. No bleeding noted. CBC stable.  Goal of Therapy:  Heparin level 0.3-0.7units/mL INR 2-3  Plan:  1. Increase heparin to 2400units/hr  2. Check heparin level in 8 hours 3. Repeat warfarin 7.5mg  x1 tonight 4. Daily PT/INR 5. Continue to monitor CBC and clinical status  Nayef College D. Melessia Kaus, PharmD Clinical Pharmacist Pager: [phone] Phone: [phone] 09/30/2011 10:58 AM

## 2011-09-30 NOTE — Progress Notes (Signed)
Name: [name] MRN: [id number] DOB: 04-Aug-1931    LOS: 2   Primary Urologist:  Dr. 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  ER evaluation demonstrated mildly elevated troponin and VQ scan with high probability of PE.  Rx'd with heparin gtt and PCCM called for admit.    Denies recent long travel.  Wears leg bag with tight straps on R leg.  Denies chest pain, lower extremity swelling, cough, syncope, dizziness, diaphoresis.    Tests / Events:  8/22 V/Q Lung scan>> IMPRESSION:  Examination is high probability for acute pulmonary embolus.  Critical Value/emergent results were called by telephone at the  time of interpretation on 09/28/2011 at 4:16 pm to Dr. Oletta Lamas, who  verbally acknowledged these results.  8/23 2DECHO>> Study Conclusions - Left ventricle: The cavity size was normal. Wall thickness was increased in a pattern of mild LVH. Systolic function was normal. The estimated ejection fraction was in the range of 55% to 60%. Regional wall motion abnormalities cannot be excluded. Doppler parameters are consistent with abnormal left ventricular relaxation (grade 1 diastolic dysfunction). - Ventricular septum: Septal motion showed paradox. - Aortic valve: There was very mild stenosis. Trivial regurgitation. Valve area: 1.32cm^2(VTI). Valve area: 1.27cm^2 (Vmax). - Right ventricle: Systolic function was mildly reduced. - Right atrium: The atrium was mildly dilated. - Atrial septum: There was increased thickness of the septum,  consistent with lipomatous hypertrophy.   8/23 VenDoppler>> Summary: No evidence of deep vein thrombosis involving the right lower extremity and left lower extremity. Unable to visualize the distal segment of the left femoral vein, therefore unable to exclude deep vein thrombosis in this Segment.  Overnight: 8/24> feeling better w/o dyspnea, CP, etc   Vital Signs: Temp:  [97.7 F (36.5 C)-98.2 F (36.8 C)] 97.7 F (36.5 C) (08/24 0739) Pulse Rate:  [59-82] 73  (08/23 2315) Resp:  [17-24] 24  (08/23 2315) BP: (94-139)/(50-90) 134/86 mmHg (08/23 2315) SpO2:  [95 %-98 %] 95 % (08/23 2315) I/O last 3 completed shifts: In: 1349.1 [P.O.:1040; I.V.:309.1] Out: 2006 [Urine:2005; Stool:1]  Physical Examination: General: wdwn adult male in NAD Neuro: AAOx4, speech clear, MAE CV: s1s2 rrr, no m/r/g, no jvd PULM: resp's even/non-labored, lungs bilaterally diminished but clear GI: round/soft, bsx4 active GU:  Indwelling catheter, suprapubic surgical incision c/d/i, edges well approximated. No erythema or drainage. Extremities: warm/dry, no edema, negative homans     Labs    CBC  Lab 09/30/11 0400 09/29/11 0345 09/28/11 1220  HGB 15.8 15.3 17.6*  HCT 45.2 43.9 48.9  WBC 7.8 8.3 9.4  PLT 156 157 181   BMET  Lab 09/30/11 0400 09/29/11 0345 09/28/11 1220  NA 136 138 139  K 4.0 3.9 --  CL 103 104 106  CO2 21 24 20   GLUCOSE 110* 122* 128*  BUN 25* 29* 30*  CREATININE 1.50* 1.70* 1.88*  CALCIUM 9.7 9.8 10.7*  MG 2.0 -- --  PHOS 3.0 -- --    Lab 09/30/11 0400 09/28/11 1508  INR 1.18 1.38  No results found for this basename: PHART:5,PCO2:5,PCO2ART:5,PO2:5,PO2ART:5,HCO3:5,TCO2:5,O2SAT:5 in the last 168 hours   Radiology: 8/22 CXR>>>Cardiomediastinal silhouette is stable. No acute infiltrate or pleural effusion. No pulmonary edema. Surgical clips in the left axilla again noted. Bony thorax is stable.   Assessment and Plan: Principal Problem:  *Pulmonary  embolism Active Problems:  PROSTATE CANCER  HYPERTENSION  CAD, NATIVE VESSEL  GERD  Mixed hyperlipidemia   Acute Pulmonary Embolism Assessment:  Hx of DVT in 2006, s/p 1 year of coumadin and IVC filter placement.  Defects noted bilaterally on VQ scan consistent with high probability of PE.  Plan: - Monitor in 3300 - IV heparin gtt==> caution with recent urological surgery. - Start Coumadin by Pharm protocol - Cardiac enzymes positive but not rising, Consult from Cards==> seen by DrWall 8/23 - LE doppler's==> neg for DVT, pt on Hep/ Coumadin now...   HTN HLD Hx of CAD Assessment: Known HTN, HLD, Hx of MI with DES to circumflex Plan: -Continue home medications. -Monitor BP.   GERD Assessment: chronic / on Nexium as outpatient Plan: -PPI.   Hx of Prostate Cancer Assessment: s/p multiple urological procedures, most recent for 'hardware' removal and placement of foley catheter to allow healing of erosion related to balloon.  Plan: - watch for hematuria given recent surgery -Will need to call [facility] urology urology on Monday attempted on Friday morning but no response.   Lonzo Cloud. Kriste Basque, MD 09/30/11 @ 9:45AM

## 2011-10-01 DIAGNOSIS — C61 Malignant neoplasm of prostate: Secondary | ICD-10-CM | POA: Diagnosis not present

## 2011-10-01 DIAGNOSIS — I2510 Atherosclerotic heart disease of native coronary artery without angina pectoris: Secondary | ICD-10-CM | POA: Diagnosis not present

## 2011-10-01 DIAGNOSIS — I82409 Acute embolism and thrombosis of unspecified deep veins of unspecified lower extremity: Secondary | ICD-10-CM | POA: Diagnosis not present

## 2011-10-01 DIAGNOSIS — I2699 Other pulmonary embolism without acute cor pulmonale: Secondary | ICD-10-CM | POA: Diagnosis not present

## 2011-10-01 LAB — CBC
HCT: 43.2 % (ref 39.0–52.0)
Hemoglobin: 15.2 g/dL (ref 13.0–17.0)
MCH: 29 pg (ref 26.0–34.0)
MCHC: 35.2 g/dL (ref 30.0–36.0)
MCV: 82.3 fL (ref 78.0–100.0)
RBC: 5.25 MIL/uL (ref 4.22–5.81)

## 2011-10-01 LAB — PROTIME-INR: Prothrombin Time: 18.8 seconds — ABNORMAL HIGH (ref 11.6–15.2)

## 2011-10-01 MED ORDER — WARFARIN SODIUM 5 MG PO TABS
5.0000 mg | ORAL_TABLET | Freq: Once | ORAL | Status: AC
  Administered 2011-10-01: 5 mg via ORAL
  Filled 2011-10-01: qty 1

## 2011-10-01 NOTE — Progress Notes (Signed)
Name: [name] MRN: [id number] DOB: May 14, 1931    LOS: 3   Primary Urologist:  Dr. Myrtie Hawk [phone] PCP - [facility] Critical Care Note   History of Present Illness: 78 y/o M with PMH of HTN, CAD s/p MI with DES to circumflex, HLD, DVT LE in 2006 s/p 1 year coumadin / IVC filter placement and prostate cancer in 2006 s/p multiple urologic procedures, most recent 09/15/11 with removal of hardware and placement of foley catheter, presented to [facility] ED 8/22 with a two day history of shortness of breath with acute worsening day of presentation.  ER evaluation demonstrated mildly elevated troponin and VQ scan with high probability of PE.  Rx'd with heparin gtt and PCCM called for admit.    Denies recent long travel.  Wears leg bag with tight straps on R leg.  Denies chest pain, lower extremity swelling, cough, syncope, dizziness, diaphoresis.    Tests / Events:  8/22 V/Q Lung scan>> IMPRESSION:  Examination is high probability for acute pulmonary embolus.  Critical Value/emergent results were called by telephone at the  time of interpretation on 09/28/2011 at 4:16 pm to Dr. Oletta Lamas, who  verbally acknowledged these results.  8/23 2DECHO>> Study Conclusions - Left ventricle: The cavity size was normal. Wall thickness was increased in a pattern of mild LVH. Systolic function was normal. The estimated ejection fraction was in the range of 55% to 60%. Regional wall motion abnormalities cannot be excluded. Doppler parameters are consistent with abnormal left ventricular relaxation (grade 1 diastolic dysfunction). - Ventricular septum: Septal motion showed paradox. - Aortic valve: There was very mild stenosis. Trivial regurgitation. Valve area: 1.32cm^2(VTI). Valve area: 1.27cm^2 (Vmax). - Right ventricle: Systolic function was mildly reduced. - Right atrium: The atrium was mildly dilated. - Atrial septum: There was increased thickness of the septum,  consistent with lipomatous hypertrophy.   8/23 VenDoppler>> Summary: No evidence of deep vein thrombosis involving the right lower extremity and left lower extremity. Unable to visualize the distal segment of the left femoral vein, therefore unable to exclude deep vein thrombosis in this Segment.  Overnight: 8/24> feeling better w/o dyspnea, CP, etc   Vital Signs: Temp:  [98.1 F (36.7 C)-98.7 F (37.1 C)] 98.7 F (37.1 C) (08/25 0755) Pulse Rate:  [71-82] 71  (08/25 0300) Resp:  [20-23] 20  (08/24 1955) BP: (106-135)/(68-77) 106/68 mmHg (08/24 1955) SpO2:  [91 %-95 %] 91 % (08/25 0300) I/O last 3 completed shifts: In: 1080 [P.O.:1080] Out: 2350 [Urine:2350]  Physical Examination: General: wdwn adult male in NAD Neuro: AAOx4, speech clear, MAE CV: s1s2 rrr, no m/r/g, no jvd PULM: resp's even/non-labored, lungs bilaterally diminished but clear GI: round/soft, bsx4 active GU:  Indwelling catheter, suprapubic surgical incision c/d/i, edges well approximated. No erythema or drainage. Extremities: warm/dry, no edema, negative homans     Labs    CBC  Lab 10/01/11 0306 09/30/11 0400 09/29/11 0345  HGB 15.2 15.8 15.3  HCT 43.2 45.2 43.9  WBC 8.4 7.8 8.3  PLT 182 156 157   BMET  Lab 09/30/11 0400 09/29/11 0345 09/28/11 1220  NA 136 138 139  K 4.0 3.9 --  CL 103 104 106  CO2 21 24 20   GLUCOSE 110* 122* 128*  BUN 25* 29* 30*  CREATININE 1.50* 1.70* 1.88*  CALCIUM 9.7 9.8 10.7*  MG 2.0 -- --  PHOS 3.0 -- --    Lab 10/01/11 0306 09/30/11 0400 09/28/11 1508  INR 1.54* 1.18  1.38    No results found for this basename: PHART:5,PCO2:5,PCO2ART:5,PO2:5,PO2ART:5,HCO3:5,TCO2:5,O2SAT:5 in the last 168 hours   Radiology: 8/22 CXR>>>Cardiomediastinal silhouette is stable. No acute infiltrate or pleural effusion. No pulmonary edema. Surgical clips in the left axilla again noted. Bony thorax is stable.   Assessment and Plan: Principal Problem:  *Pulmonary  embolism Active Problems:  PROSTATE CANCER  HYPERTENSION  CAD, NATIVE VESSEL  GERD  Mixed hyperlipidemia   Acute Pulmonary Embolism Assessment:  Hx of DVT in 2006, s/p 1 year of coumadin and IVC filter placement.  Defects noted bilaterally on VQ scan consistent with high probability of PE.  Plan: - Monitor in 3300 - IV heparin gtt==> caution with recent urological surgery. - on Coumadin by Pharm protocol - Cardiac enzymes positive but not rising, Consult from Cards==> seen by [facility] 8/23, they signed off. - LE doppler's==> neg for DVT, pt on Hep/ Coumadin now... - he has an old filter in place  HTN HLD Hx of CAD Assessment: Known HTN, HLD, Hx of MI with DES to circumflex Plan: -Continue home medications. -Monitor BP.   GERD Assessment: chronic / on Nexium as outpatient Plan: -PPI.   Hx of Prostate Cancer Assessment: s/p multiple urological procedures, most recent for 'hardware' removal and placement of foley catheter to allow healing of erosion related to balloon.  Plan: - watch for hematuria given recent surgery -Will need to call [facility] urology urology on Monday attempted on Friday morning but no response.   Lonzo Cloud. Kriste Basque, MD 10/01/11 @ 10:10AM

## 2011-10-01 NOTE — ED Provider Notes (Signed)
I saw and evaluated the patient, reviewed the resident's note and I agree with the findings and plan.  I reviewed and agree with resident interpretation of ECG.  Pt with SOB, recent urologic procedure, rather sudden SOB without CP.  Pt's MI was associated with chest discomfort and has had none this episode.  No sig changes on ECG.  Troponin is mildly up, however PE is further consideration.  V/Q came back high probability.  Heparin was started priro in setting of high risk for PE as well as possible ACS with elevated troponin.  Cr up at 1.88, gentle IVF's, given.  PCCM consulted for admission.  CRITICAL CARE Performed by: Lear Ng   Total critical care time: 30 min  Critical care time was exclusive of separately billable procedures and treating other patients.  Critical care was necessary to treat or prevent imminent or life-threatening deterioration.  Critical care was time spent personally by me on the following activities: development of treatment plan with patient and/or surrogate as well as nursing, discussions with consultants, evaluation of patient's response to treatment, examination of patient, obtaining history from patient or surrogate, ordering and performing treatments and interventions, ordering and review of laboratory studies, ordering and review of radiographic studies, pulse oximetry and re-evaluation of patient's condition.   Impression: PE    Gavin Pound. Oletta Lamas, MD 10/01/11 [phone]

## 2011-10-01 NOTE — Progress Notes (Signed)
Patient Name: [name] Date of Encounter: 10/01/2011    Principal Problem:  *Pulmonary embolism Active Problems:  PROSTATE CANCER  HYPERTENSION  CAD, NATIVE VESSEL  GERD  Mixed hyperlipidemia   SUBJECTIVE  No c/p, sob.  Complains that bed is uncomfortable and his back hurts.  RN has obtained another bed for him.  CURRENT MEDS    . aspirin EC  81 mg Oral Daily  . coumadin book   Does not apply Once  . fenofibrate  160 mg Oral Daily  . LORazepam  1 mg Oral Once  . multivitamin with minerals  1 tablet Oral Daily  . oxybutynin  5 mg Oral TID  . pantoprazole  40 mg Oral Daily  . simvastatin  10 mg Oral q1800  . vitamin B-12  1,000 mcg Oral Daily  . warfarin  7.5 mg Oral ONCE-1800  . warfarin   Does not apply Once  . Warfarin - Pharmacist Dosing Inpatient   Does not apply q1800    OBJECTIVE  Filed Vitals:   09/30/11 1539 09/30/11 1955 10/01/11 0300 10/01/11 0755  BP:  106/68    Pulse:  82 71   Temp: 98.4 F (36.9 C)  98.3 F (36.8 C) 98.7 F (37.1 C)  TempSrc: Oral  Oral Oral  Resp:  20    Height:      Weight:      SpO2:  95% 91%     Intake/Output Summary (Last 24 hours) at 10/01/11 0812 Last data filed at 10/01/11 0755  Gross per 24 hour  Intake   1080 ml  Output   1750 ml  Net   -670 ml   Filed Weights   09/28/11 1219 09/28/11 1900 09/29/11 0500  Weight: 237 lb (107.502 kg) 233 lb 7.5 oz (105.9 kg) 230 lb 9.6 oz (104.6 kg)   PHYSICAL EXAM  General: Pleasant, NAD. Neuro: Alert and oriented X 3. Moves all extremities spontaneously. Psych: Normal affect. HEENT:  Normal  Neck: Supple without bruits or JVD. Lungs:  Resp regular and unlabored, basilar crackles. Heart: RRR no s3, s4.  2/6 SEM @ left sternal border. Abdomen: Soft, non-tender, non-distended, BS + x 4.  Extremities: No clubbing, cyanosis or edema. DP/PT/Radials 2+ and equal bilaterally.  Accessory Clinical Findings  CBC  Basename 10/01/11 0306 09/30/11 0400 09/28/11 1220  WBC  8.4 7.8 --  NEUTROABS -- -- 7.2  HGB 15.2 15.8 --  HCT 43.2 45.2 --  MCV 82.3 82.8 --  PLT 182 156 --   Basic Metabolic Panel  Basename 09/30/11 0400 09/29/11 0345  NA 136 138  K 4.0 3.9  CL 103 104  CO2 21 24  GLUCOSE 110* 122*  BUN 25* 29*  CREATININE 1.50* 1.70*  CALCIUM 9.7 9.8  MG 2.0 --  PHOS 3.0 --   Cardiac Enzymes  Basename 09/28/11 2138 09/28/11 1624 09/28/11 1220  CKTOTAL -- -- --  CKMB -- -- --  CKMBINDEX -- -- --  TROPONINI 0.35* 0.34* 0.32*   Lab Results  Component Value Date   INR 1.54* 10/01/2011   INR 1.18 09/30/2011   INR 1.38 09/28/2011    TELE  Rsr, 1st deg avb, occas pvc  Radiology/Studies  Nm Pulmonary Perf And Vent  09/28/2011  *RADIOLOGY REPORT*  Clinical Data: Evaluate for pulmonary embolus  NM PULMONARY VENTILATION AND PERFUSION SCAN  Radiopharmaceutical: CURIE MAA TECHNETIUM TO 81M ALBUMIN AGGREGATED, CURIE xenon xe 133 gas 16 milli Curie XENON XE 133 GAS  Comparison:  Chest radiograph from today  Findings: On the ventilation portion of the examination there is mild xenon tracer retention within both upper lobes.  On the perfusion portion of the examination there is a large segmental perfusion defects within the right midlung.  Multiple large segmental perfusion defects are noted in the left midlung and left base.  These are unmatched when compared with the ventilation images.  IMPRESSION:  1.  Examination is high probability for acute pulmonary embolus.  Critical Value/emergent results were called by telephone at the time of interpretation on 09/28/2011 at 4:16 pm to Dr. Oletta Lamas, who verbally acknowledged these results.   Original Report Authenticated By: Rosealee Albee, M.D.     ASSESSMENT AND PLAN  1.  Acute PE:  On heparin/coumadin.  INR remains subRx.  Really wants to go home.  Has given himself lovenox in the past.  2.  CAD:  Stable w/o c/p, sob.  Trop mildly elevated, likely in setting of PE/RV strain.  Cont asa/statin.  No  bb 2/2 h/o hypotn and AV block.  EF 55-60% with mildly reduced RV fxn by echo this admission.  PA inadequately visualized.  3.  CKD Stage III:  Stable - creat 1.5 (improved since admission).  4.  Prostate CA:  F/u  @ [facility].  No hematuria @ this point.  Signed, Nicolasa Ducking NP Patient examined and agree.  Valera Castle, MD 10/01/2011 10:33 AM

## 2011-10-01 NOTE — Progress Notes (Addendum)
ANTICOAGULATION CONSULT NOTE - Follow Up Consult  Pharmacy Consult for Heparin/warfarin Indication: pulmonary embolus  Allergies  Allergen Reactions  . Darifenacin Hydrobromide Er Nausea Only    Dizziness    Patient Measurements: Height: 6\' 1"  (185.4 cm) Weight: 230 lb 9.6 oz (104.6 kg) IBW/kg (Calculated) : 79.9  Heparin Dosing Weight: 101.3 kg  Vital Signs: Temp: 98.4 F (36.9 C) (08/25 1117) Temp src: Oral (08/25 1117) Pulse Rate: 71  (08/25 0300)  Labs:  Basename 10/01/11 0306 09/30/11 1940 09/30/11 0900 09/30/11 0400 09/29/11 0345 09/28/11 2138 09/28/11 1624 09/28/11 1508 09/28/11 1220  HGB 15.2 -- -- 15.8 -- -- -- -- --  HCT 43.2 -- -- 45.2 43.9 -- -- -- --  PLT 182 -- -- 156 157 -- -- -- --  APTT -- -- -- -- -- -- -- 78* --  LABPROT 18.8* -- -- 15.3* -- -- -- 17.2* --  INR 1.54* -- -- 1.18 -- -- -- 1.38 --  HEPARINUNFRC 0.49 0.52 0.15* -- -- -- -- -- --  CREATININE -- -- -- 1.50* 1.70* -- -- -- 1.88*  CKTOTAL -- -- -- -- -- -- -- -- --  CKMB -- -- -- -- -- -- -- -- --  TROPONINI -- -- -- -- -- 0.35* 0.34* -- 0.32*    Estimated Creatinine Clearance: 51.6 ml/min (by C-G formula based on Cr of 1.5).  Assessment: 39 YOM admitted with respiratory distress and then VQ scan showed high probability of PE. Was given bolus of 4000 units and then started on infusion at 1250 units/hr. Level had been inadequate and was therefore increased. Is currently on heparin IV 2400 units/hr with a therapeutic level of 0.49. INR is 1.54 after two doses of warfarin 7.5mg . No bleeding noted. CBC stable.  Goal of Therapy:  INR 2-3 Heparin level 0.3-0.7 units/ml Monitor platelets by anticoagulation protocol: Yes   Plan:  1. Continue heparin drip at 2400units/hour 2. Warfarin 5mg  PO x1 tonight 3. Daily HL, CBC, PT/INR   Brittnee Gaetano D. Cylah Fannin, PharmD Clinical Pharmacist Pager: [phone] Phone: [phone] 10/01/2011 12:20 PM

## 2011-10-02 ENCOUNTER — Encounter (HOSPITAL_COMMUNITY): Payer: Self-pay | Admitting: Pulmonary Disease

## 2011-10-02 DIAGNOSIS — I2510 Atherosclerotic heart disease of native coronary artery without angina pectoris: Secondary | ICD-10-CM | POA: Diagnosis not present

## 2011-10-02 DIAGNOSIS — I2699 Other pulmonary embolism without acute cor pulmonale: Secondary | ICD-10-CM | POA: Diagnosis not present

## 2011-10-02 LAB — PROTHROMBIN GENE MUTATION

## 2011-10-02 LAB — CBC
HCT: 40.4 % (ref 39.0–52.0)
Hemoglobin: 14 g/dL (ref 13.0–17.0)
MCH: 28.6 pg (ref 26.0–34.0)
MCV: 82.6 fL (ref 78.0–100.0)
Platelets: 182 10*3/uL (ref 150–400)
RBC: 4.89 MIL/uL (ref 4.22–5.81)
WBC: 8.1 10*3/uL (ref 4.0–10.5)

## 2011-10-02 LAB — HEPARIN LEVEL (UNFRACTIONATED): Heparin Unfractionated: 0.21 IU/mL — ABNORMAL LOW (ref 0.30–0.70)

## 2011-10-02 MED ORDER — SENNOSIDES-DOCUSATE SODIUM 8.6-50 MG PO TABS
3.0000 | ORAL_TABLET | Freq: Every day | ORAL | Status: DC
  Administered 2011-10-03: 3 via ORAL
  Filled 2011-10-02 (×3): qty 3

## 2011-10-02 MED ORDER — HEPARIN BOLUS VIA INFUSION
1500.0000 [IU] | Freq: Once | INTRAVENOUS | Status: AC
  Administered 2011-10-02: 1500 [IU] via INTRAVENOUS
  Filled 2011-10-02: qty 1500

## 2011-10-02 MED ORDER — ENOXAPARIN SODIUM 120 MG/0.8ML ~~LOC~~ SOLN
110.0000 mg | Freq: Two times a day (BID) | SUBCUTANEOUS | Status: DC
  Administered 2011-10-02 (×2): 110 mg via SUBCUTANEOUS
  Filled 2011-10-02 (×4): qty 0.8

## 2011-10-02 MED ORDER — WARFARIN SODIUM 5 MG PO TABS
5.0000 mg | ORAL_TABLET | Freq: Once | ORAL | Status: AC
  Administered 2011-10-02: 5 mg via ORAL
  Filled 2011-10-02: qty 1

## 2011-10-02 MED ORDER — POLYETHYLENE GLYCOL 3350 17 G PO PACK
17.0000 g | PACK | Freq: Two times a day (BID) | ORAL | Status: DC
  Administered 2011-10-03 – 2011-10-04 (×3): 17 g via ORAL
  Filled 2011-10-02 (×6): qty 1

## 2011-10-02 NOTE — Progress Notes (Signed)
ANTICOAGULATION CONSULT NOTE - Follow Up Consult  Pharmacy Consult for Lovenox/warfarin Indication: pulmonary embolus  Allergies  Allergen Reactions  . Darifenacin Hydrobromide Er Nausea Only    Dizziness    Patient Measurements: Height: 6\' 1"  (185.4 cm) Weight: 245 lb 13 oz (111.5 kg) IBW/kg (Calculated) : 79.9   Vital Signs: Temp: 98.4 F (36.9 C) (08/26 0800) Temp src: Oral (08/26 0800) BP: 128/79 mmHg (08/26 0800) Pulse Rate: 71  (08/26 0800)  Labs:  Basename 10/02/11 0410 10/01/11 0306 09/30/11 1940 09/30/11 0400  HGB 14.0 15.2 -- --  HCT 40.4 43.2 -- 45.2  PLT 182 182 -- 156  APTT -- -- -- --  LABPROT 22.6* 18.8* -- 15.3*  INR 1.95* 1.54* -- 1.18  HEPARINUNFRC 0.21* 0.49 0.52 --  CREATININE -- -- -- 1.50*  CKTOTAL -- -- -- --  CKMB -- -- -- --  TROPONINI -- -- -- --    Estimated Creatinine Clearance: 53.1 ml/min (by C-G formula based on Cr of 1.5).  Assessment: 31 YOM admitted with respiratory distress and then VQ scan showed high probability of PE.  Stable pulmonary status, now to transition heparin gtt to lovenox. Xa this am was subtherapeutic, ok to start. INR trending up on coumadin. This is day #4/5 minimum overlap, will need at least one more day.   Goal of Therapy:  INR=2-3 Monitor platelets by anticoagulation protocol: Yes   Plan:  Lovenox 110mg  sq q12h and coumadin 5mg  today. F/u in am.  Verlene Mayer, PharmD, BCPS Pager [phone] 10/02/2011 10:13 AM

## 2011-10-02 NOTE — Progress Notes (Signed)
Patient ambulated about 125ft. Had some wheezing on ambulation, oxygen fluctuated between 88, 89, 90, 92.  10/02/11 1756 10/02/11 1800 10/02/11 1803  Oxygen Therapy  SpO2 92 % (on anbulation) ! 89 % (while ambulating) 90 % (while ambulating)  O2 Device Nasal cannula Nasal cannula Nasal cannula  O2 Flow Rate (L/min) 3 L/min 3 L/min 3 L/min

## 2011-10-02 NOTE — Care Management Note (Unsigned)
    Page 1 of 2   10/04/2011     9:03:34 AM   CARE MANAGEMENT NOTE 10/04/2011  Patient:  Noah [name],Noah [name]   Account Number:  [id number]  Date Initiated:  10/02/2011  Documentation initiated by:  Noah [name]  Subjective/Objective Assessment:   Pt admitted with bil. PEs     Action/Plan:   PTA pt lived at home with [name], PT eval   Anticipated DC Date:  10/03/2011   Anticipated DC Plan:  HOME W HOME HEALTH SERVICES      DC Planning Services  CM consult      [facility] Choice  HOME HEALTH  DURABLE MEDICAL EQUIPMENT   Choice offered to / List presented to:  Noah [name]   DME arranged  Noah [name]      DME agency  [facility]     [facility] arranged  HH-1 RN  HH-10 DISEASE MANAGEMENT      HH agency  [facility]   Status of service:  In process, will continue to follow Medicare Important Message given?   (If response is "NO", the following Medicare IM given date fields will be blank) Date Medicare IM given:   Date Additional Medicare IM given:    Discharge Disposition:    Per UR Regulation:  Reviewed for med. necessity/level of care/duration of stay  If discussed at Long Length of Stay Meetings, dates discussed:    Comments:  PCP- Noah [name]   10/04/11  0856  Noah Lafon SIMMONS RN, BSN [phone] REFERRAL PLACED TO [place] WITH [facility] FOR RW.   10/03/11  1028  Noah [name] SIMMONS RN, BSN [phone] REFERRAL PLACED TO [place] WITH [facility] FOR HHRN; SOC DATE: WITHIN 24-48HRS POST D/C; NCM WILL FOLLOW.  10/02/11  1411  Noah [name] SIMMONS RN, BSN [phone] Lovenox 100mg   is covered with a estimated copay of $23.84 for a 30 day supply and a estimated copay $70.00  for a 90 day supply through mail order. No prior auth required. Enoxaparin 100mg  is covered with an estimated copay of $9.28 for a 30 day supply and estimated copay of $10.00 for a 90 supply through mail order.   10/02/11- 1100- Noah Pierini RN, BSN [phone] Spoke with pt and wife at bedside regarding  d/c plans and needs. Per conversation pt does not want to go to any ST-SNF for rehab- plan is to return home - pt is agreeable to [facility] at discharge. List of agencies for [facility] given to wife- pt has had HH in past- and has used [place]- per pt and wife choice for [facility] is [place] for any HH needs. DME already in the home includes- RW, 3n1, shower chair. Discussed possible need for rollator at home and at this time both pt and wife do not feel like  pt will need one- info given on medical supply stores and rollators. Pt may need to go home with home O2- discussed home 02 setup- pt may also go home on Lovenox- have asked for Lovenox benefit check and will let pt and wife know when benefit check completed- per pt and wife doctors are discussing whether pt will stay here until therapeutic or go home on Lovenox. MD please write HH- PT orders with F2F and RN to check PT/INR if pt goes home on Lovenox. Pt to tx to 2000 today will f/u with CM on 2000.

## 2011-10-02 NOTE — Progress Notes (Signed)
Utilization review completed.  

## 2011-10-02 NOTE — Progress Notes (Signed)
Pt. Transferred from 3305 to 2036. Receiving nurse given report. Medications and chart given to receiving unit. Family present upon transport.   Kathlene Cote A RN

## 2011-10-02 NOTE — Progress Notes (Signed)
Physical Therapy Treatment Patient Details Name: [name] MRN: [id number] DOB: February 22, 1931 Today's Date: 10/02/2011 Time: 0950-1020 PT Time Calculation (min): 30 min  PT Assessment / Plan / Recommendation Comments on Treatment Session  Pt limited this session due to anxiety and agitation due to MD stating pt to go home today.  Pt and wife were very anxious and felt unprepared to d/c home today even though pt wants to go home.  Pt's wife was able to speak with MD and MD stated pt does not have to d/c home due to INR needs to maintain 2-3 for couple days.  Wife continues to be anxious after speaking with MD.  Wife wanting to return to work for financial reasons and unsure she can return to work while pt at home.  Wife to find assistance during the day due to pt adamantly refusing SNF at this time.  To allow wife to return to work pt will benefit from HHPT, [facility], rollater for energy conservation/hold O2 and mostly like need home O2.   Spoke with CM and they are aware and plans to discuss with pt and pt's wife to reduce anxiety.    Follow Up Recommendations  Home health PT;Supervision - Intermittent    Barriers to Discharge        Equipment Recommendations  Other (comment) (rollater, home O2, HHRN)    Recommendations for Other Services    Frequency Min 3X/week   Plan Frequency remains appropriate;Discharge plan needs to be updated    Precautions / Restrictions Precautions Precautions: None;Fall Restrictions Weight Bearing Restrictions: No   Pertinent Vitals/Pain No c/o pain    Mobility  Transfers Transfers: Sit to Stand;Stand to Sit;Stand Pivot Transfers Sit to Stand: 4: Min assist;From chair/3-in-1;With armrests Stand to Sit: 4: Min guard;To elevated surface;To chair/3-in-1 Details for Transfer Assistance: Min (A) to initiate transfer with cues for hand placement.  Max cues for safety with use of RW.  Pt tends to keep RW to side during transfers. Ambulation/Gait Ambulation/Gait  Assistance: 4: Min guard Ambulation Distance (Feet): 25 Feet Assistive device: Rolling walker Ambulation/Gait Assistance Details: Minguard for safety with max cues for RW placement.  Pt continues to keep RW too far in front with forward flexed posture after several cues to step inside RW.  Pt tends to pick RW up and over lines and IV pole without waiting for therpist.  Pt very impulsive and ready to sit down. Gait Pattern: Shuffle;Trunk flexed;Wide base of support    Exercises     PT Diagnosis:    PT Problem List:   PT Treatment Interventions:     PT Goals Acute Rehab PT Goals PT Goal Formulation: With patient Time For Goal Achievement: 10/13/11 Potential to Achieve Goals: Good Pt will go Sit to Stand: with modified independence PT Goal: Sit to Stand - Progress: Progressing toward goal Pt will go Stand to Sit: with modified independence PT Goal: Stand to Sit - Progress: Progressing toward goal Pt will Ambulate: 51 - 150 feet;with modified independence;with rolling walker PT Goal: Ambulate - Progress: Goal set today  Visit Information  Last PT Received On: 10/02/11 Assistance Needed: +1    Subjective Data  Subjective: "I'm not going to NO nursing home."  (pt was speaking with wife) Patient Stated Goal: Golf   Cognition  Overall Cognitive Status: Appears within functional limits for tasks assessed/performed Arousal/Alertness: Awake/alert Orientation Level: Oriented X4 / Intact Behavior During Session: Anxious Cognition - Other Comments: Pt with impuslive tendancies and needs max cues for  safety.    Balance     End of Session PT - End of Session Equipment Utilized During Treatment: Gait belt Activity Tolerance: Other (comment) (pt very anxious and agitated after speaking with MD) Patient left: in chair;with call bell/phone within reach Nurse Communication: Mobility status   GP     Zenovia Justman 10/02/2011, 10:47 AM Jake Shark, PT DPT [phone]

## 2011-10-02 NOTE — Progress Notes (Signed)
Name: [name] MRN: [id number] DOB: 07/19/1931    LOS: 4   Primary Urologist:  Dr. Myrtie Hawk [phone] PCP - [facility]   History of Present Illness: 78 y/o M with PMH of HTN, CAD s/p MI with DES to circumflex, HLD, DVT LE in 2006 s/p 1 year coumadin / IVC filter placement and prostate cancer in 2006 s/p multiple urologic procedures, most recent 09/15/11 with removal of hardware and placement of foley catheter, presented to [facility] ED 8/22 with a two day history of shortness of breath with acute worsening day of presentation.  ER evaluation demonstrated mildly elevated troponin and VQ scan with high probability of PE.  Rx'd with heparin gtt and PCCM called for admit.    Denies recent long travel.  Wears leg bag with tight straps on R leg.  Denies chest pain, lower extremity swelling, cough, syncope, dizziness, diaphoresis.    Tests / Events:  8/22 V/Q Lung scan>> IMPRESSION:  Examination is high probability for acute pulmonary embolus.  Critical Value/emergent results were called by telephone at the  time of interpretation on 09/28/2011 at 4:16 pm to Dr. Oletta Lamas, who  verbally acknowledged these results.  8/23 2DECHO>> Study Conclusions - Left ventricle: The cavity size was normal. Wall thickness was increased in a pattern of mild LVH. Systolic function was normal. The estimated ejection fraction was in the range of 55% to 60%. Regional wall motion abnormalities cannot be excluded. Doppler parameters are consistent with abnormal left ventricular relaxation (grade 1 diastolic dysfunction). - Ventricular septum: Septal motion showed paradox. - Aortic valve: There was very mild stenosis. Trivial regurgitation. Valve area: 1.32cm^2(VTI). Valve area: 1.27cm^2 (Vmax). - Right ventricle: Systolic function was mildly reduced. - Right atrium: The atrium was mildly dilated. - Atrial septum: There was increased thickness of the septum,  consistent with lipomatous hypertrophy.   8/23 VenDoppler>> Summary: No evidence of deep vein thrombosis involving the right lower extremity and left lower extremity. Unable to visualize the distal segment of the left femoral vein, therefore unable to exclude deep vein thrombosis in this Segment.  Overnight: 8/26 improved. Wants to go home  No bm in days , still on oxygen  Vital Signs: Temp:  [98.4 F (36.9 C)] 98.4 F (36.9 C) (08/26 0800) Pulse Rate:  [67-74] 71  (08/26 0800) Resp:  [19-25] 20  (08/26 0800) BP: (117-135)/(65-81) 128/79 mmHg (08/26 0800) SpO2:  [94 %-95 %] 95 % (08/26 0800) Weight:  [110.8 kg (244 lb 4.3 oz)-111.5 kg (245 lb 13 oz)] 111.5 kg (245 lb 13 oz) (08/26 0500) I/O last 3 completed shifts: In: 2458.9 [P.O.:960; I.V.:1498.9] Out: 3400 [Urine:3400]  Physical Examination: General: wdwn adult male in NAD Neuro: AAOx4, speech clear, MAE CV: s1s2 rrr, no m/r/g, no jvd PULM: resp's even/non-labored, lungs bilaterally diminished but clear GI: round/soft, bsx4 active GU:  Indwelling catheter, suprapubic surgical incision c/d/i, edges well approximated. No erythema or drainage. Extremities: warm/dry, no edema, negative homans     Labs    CBC  Lab 10/02/11 0410 10/01/11 0306 09/30/11 0400  HGB 14.0 15.2 15.8  HCT 40.4 43.2 45.2  WBC 8.1 8.4 7.8  PLT 182 182 156   BMET  Lab 09/30/11 0400 09/29/11 0345 09/28/11 1220  NA 136 138 139  K 4.0 3.9 --  CL 103 104 106  CO2 21 24 20   GLUCOSE 110* 122* 128*  BUN 25* 29* 30*  CREATININE 1.50* 1.70* 1.88*  CALCIUM 9.7  9.8 10.7*  MG 2.0 -- --  PHOS 3.0 -- --    Lab 10/02/11 0410 10/01/11 0306 09/30/11 0400 09/28/11 1508  INR 1.95* 1.54* 1.18 1.38    No results found for this basename: PHART:5,PCO2:5,PCO2ART:5,PO2:5,PO2ART:5,HCO3:5,TCO2:5,O2SAT:5 in the last 168 hours   Radiology: 8/22 CXR>>>Cardiomediastinal silhouette is stable. No acute infiltrate or pleural effusion. No pulmonary edema. Surgical  clips in the left axilla again noted. Bony thorax is stable.   Assessment and Plan: Principal Problem:  *Pulmonary embolism Active Problems:  PROSTATE CANCER  HYPERTENSION  CAD, NATIVE VESSEL  GERD  Mixed hyperlipidemia   Acute Pulmonary Embolism   Lab 10/02/11 0410 10/01/11 0306 09/30/11 0400 09/28/11 1508  INR 1.95* 1.54* 1.18 1.38    Assessment:  Hx of DVT in 2006, s/p 1 year of coumadin and IVC filter placement.  Defects noted bilaterally on VQ scan consistent with high probability of PE.  Needs three days therapeutic INR with lovenox/heparin.  Not yet therapeutic inr.  Plan: -tfr to floor tele -start lovenox prob d/c AM 8/27 Set up coumadin clinic on d/c   HTN HLD Hx of CAD Assessment: Known HTN, HLD, Hx of MI with DES to circumflex Plan: -Continue home medications. -Monitor BP.   GERD Assessment: chronic / on Nexium as outpatient Plan: -PPI.   Hx of Prostate Cancer Assessment: s/p multiple urological procedures, most recent for 'hardware' removal and placement of foley catheter to allow healing of erosion related to balloon.  Plan: -only need to notify [facility] urology Dr Lois Huxley of admission/d/c   Dorcas Carrow Beeper  [phone]  Cell  [phone]  If no response or cell goes to voicemail, call beeper [phone]  10/02/2011

## 2011-10-02 NOTE — Discharge Summary (Signed)
Physician Discharge Summary  Patient ID: [name] MRN: [id number] DOB/AGE: 06-10-1931 78 y.o.  Admit date: 09/28/2011 Discharge date: 10/04/2011  Primary Urologist: Dr. Myrtie Hawk [phone]  PCP - Dr. Tiburcio Pea at [place]      Discharge Diagnoses:  Acute Pulmonary embolism Acute Respiratory Distress / Insufficiency Hypoxia Hx of DVT with previous IVC filter placement (2006) Hypertension CAD Mixed Hyperlipidemia GERD Hx of Prostate Cancer Acute Renal Insufficiency    Brief Summary: [name] is a 78 y.o. y/o male with a PMH of HTN, CAD s/p MI with DES to circumflex, HLD, DVT LE in 2006 s/p 1 year coumadin / IVC filter placement and prostate cancer in 2006 s/p multiple urologic procedures, most recent 09/15/11 with removal of hardware and placement of foley catheter, presented to [facility] ED 8/22 with a two day history of shortness of breath with acute worsening day of presentation. ER evaluation demonstrated mildly elevated troponin and VQ scan with high probability of PE. Heparin drip initiated in ER and admitted to ICU for close hemodynamic observation.  Cardiology consulted in the setting of known cardiac disease, elevated troponin and new PE.  Transitioned to Lovenox and coumadin.  Completed lovenox bridge while inpatient and discharged on coumadin.      Consults: [facility]  Significant Diagnostic Studies:  8/22 V/Q Lung scan>>Examination is high probability for acute pulmonary embolus.   8/23 2DECHO>> Left ventricle: The cavity size was normal. Wall thickness was increased in a pattern of mild LVH. Systolic function was normal. The estimated ejection fraction was in the range of 55% to 60%. Regional wall motion abnormalities cannot be excluded. Doppler parameters are consistent with abnormal left ventricular relaxation (grade 1 diastolic Dysfunction). - Ventricular septum: Septal motion showed paradox.- Aortic valve: There was very mild stenosis.  Trivial regurgitation. Valve area: 1.32cm^2(VTI). Valve area: 1.27cm^2 (Vmax). - Right ventricle: Systolic function was mildly reduced. - Right atrium: The atrium was mildly dilated. - Atrial septum: There was increased thickness of the septum, consistent with lipomatous hypertrophy.  8/23 VenDoppler>>No evidence of deep vein thrombosis involving the right lower extremity and left lower extremity. Unable to visualize the distal segment of the left femoral vein, therefore unable to exclude deep vein thrombosis in this Segment.                                                                     Hospital Summary by Discharge Diagnosis  Acute Pulmonary embolism Hx of DVT with previous IVC filter placement (2006) Acute Respiratory Hypoxia  History of lower extremity DVT in 2006 (around time of prostate cancer diagnosis with surgery) and was treated with coumadin for one year.  IVC filter was placed at that time.  He presented with a two day history of increased shortness of breath with minimal activity.  Recent surgery on 8/9 as outpatient at [facility] for removal of urinary assist device.  VQ scan (elevated serum creatinine on admit)on admit consistent with large perfusion defect in right / left mid-lung consistent with pulmonary embolisim.   Lower extremity dopplers were negative for DVT which lends concern for clot origination at IVC filter.  ECHO assessed with EF of 55-60%, mild reduction of RV function.  Elevated troponin in the setting of RV strain.  Given  IVC filter and new clot burden, he will need lifelong coumadin administration.    Discharge Plan: -coumadin for life, goal INR 2-3 -follow up in [facility]   Hypertension CAD Mixed Hyperlipidemia Known history of CAD, HTN and HLD.  Mild chest tightness on admit with mild elevations of troponin -likely in setting of RV strain related to PE.  ECHO assessed with EF of 55-60% with mildy reduced RV function. PA inadequately visualized.    Discharge Plan: -continue aspirin & statin -no beta blocker secondary to a history of associated hypotension and AV block   GERD  Hx of Prostate Cancer Recent removal of urinary assist device.  Has planned outpatient appointment on 9/4 with Dr. Vonita Moss at [facility].  Tentative plans for cystoscopy to review areas of erosion from previous device.  Would recommend delay of surgical procedure for a minimum of 4 weeks to allow for reduction of current clot burden prior to holding coumadin for procedure.    Discharge Plan: - follow up with Dr. Vonita Moss as previously scheduled - no interruption of coumadin for minimum of 4 weeks post discharge   Acute Renal Insufficiency Resolving acute renal insufficiency.  Appears baseline serum creatinine runs from 1.2-1.8 in last year.  Mildly soft BP on admit.  ? If related to recent surgical procedures -unclear if he received any form of contrast.    Discharge Plan: -repeat BMP PRN   Discharge Exam: General: wdwn adult male in NAD  Neuro: AAOx4, speech clear, MAE  CV: s1s2 rrr, no m/r/g, no jvd  PULM: resp's even/non-labored, lungs bilaterally diminished but clear  GI: round/soft, bsx4 active  GU: Indwelling catheter, suprapubic surgical incision c/d/i, edges well approximated. No erythema or drainage.  Extremities: warm/dry, no edema, negative homans    Filed Vitals:   10/03/11 1433 10/03/11 1435 10/03/11 1949 10/04/11 0534  BP:  130/62 117/71 122/72  Pulse: 88 71 75 66  Temp:  98.6 F (37 C) 98.6 F (37 C) 98.6 F (37 C)  TempSrc:  Oral Oral Oral  Resp:  19 20 18   Height:      Weight:    234 lb 5.6 oz (106.3 kg)  SpO2: 95% 97% 95% 95%     Discharge Labs  BMET  Lab 10/03/11 0625 09/30/11 0400 09/29/11 0345 09/28/11 1220  NA 137 136 138 139  K 3.9 4.0 -- --  CL 102 103 104 106  CO2 27 21 24 20   GLUCOSE 112* 110* 122* 128*  BUN 18 25* 29* 30*  CREATININE 1.58* 1.50* 1.70* 1.88*  CALCIUM 10.0 9.7 9.8 10.7*  MG -- 2.0 -- --   PHOS -- 3.0 -- --   CBC  Lab 10/03/11 0625 10/02/11 0410 10/01/11 0306  HGB 13.9 14.0 15.2  HCT 40.3 40.4 43.2  WBC 7.8 8.1 8.4  PLT 209 182 182   Anti-Coagulation  Lab 10/04/11 0600 10/03/11 0625 10/02/11 0410 10/01/11 0306 09/30/11 0400  INR 3.13* 2.64* 1.95* 1.54* 1.18   Discharge Orders    Future Appointments: Provider: Department: Dept Phone: Center:   10/06/2011 10:35 AM [facility] [phone] None   12/28/2011 2:15 PM Rollene Rotunda, MD [facility] [phone] [facility]     Future Orders Please Complete By Expires   Diet - low sodium heart healthy      Increase activity slowly      Discharge instructions      Comments:   -Review your medications carefully. -Follow up with Dr. Vonita Moss as  previously scheduled -Caution with shaving etc, monitor for spontaneous bleeding from nose, urine, and gums   Call MD for:  difficulty breathing, headache or visual disturbances      Call MD for:  severe uncontrolled pain         Follow-up Information    Follow up with [facility] on 10/06/2011. (Appt at 10:35)    Contact information:   [address] [place] [id number]       Follow up with Johny Blamer, MD on 10/13/2011. (Appt at 10:30 AM)    Contact information:   [facility] [address] [place] [address] [phone]       Follow up with Dr. Lois Huxley on 10/11/2011. (As previously scheduled)        DISCHARGE MEDICATIONS Medication List  As of 10/04/2011 11:17 AM   START taking these medications         DSS 100 MG Caps   Take 100 mg by mouth 2 (two) times daily as needed.      polyethylene glycol packet   Commonly known as: MIRALAX / GLYCOLAX   Take 17 g by mouth 2 (two) times daily.      warfarin 5 MG tablet   Commonly known as: COUMADIN   Take 2.5 mg tonight 8/28, and 5 mg 8/29.  Follow up in coumadin clinic on 8/30          CONTINUE taking these medications         aspirin 81 MG tablet      B-12 1000 MCG Caps      esomeprazole 40 MG capsule   Commonly known as: NEXIUM      EXCEDRIN PM PO      fenofibrate 160 MG tablet   ONE DAILY      FISH OIL CONCENTRATE PO      GLUCOSAMINE CHONDROITIN ADV PO      multivitamin capsule      nitroGLYCERIN 0.4 MG SL tablet   Commonly known as: NITROSTAT      oxybutynin 5 MG tablet   Commonly known as: DITROPAN      pravastatin 20 MG tablet   Commonly known as: PRAVACHOL   TAKE 1 TABLET EVERY DAY         STOP taking these medications         OVER THE COUNTER MEDICATION          Where to get your medications    These are the prescriptions that you need to pick up.   You may get these medications from any pharmacy.         warfarin 5 MG tablet         Information on where to get these meds is not yet available. Ask your nurse or doctor.         DSS 100 MG Caps   polyethylene glycol packet              Disposition: 01-Home or Self Care  Discharged Condition: [name] has met maximum benefit of inpatient care and is medically stable and cleared for discharge.  He will be set up for rolling walker at time of discharge.  He declined the rollator and opted for regular walker.  Able to ambulate 550 ft with rolling walker and tolerate well.  Did not need home O2.  Ambulatory saturations > 94% with ambulation on Room Air.  Patient is pending follow up as  above.      Time spent on disposition:  Greater than 35 minutes.   Signed: Canary Brim, NP-C [facility] Pgr: [phone]   Independently examined pt, evaluated data & formulated above care plan with NP, discussed instructions with pt & wife  [facility]

## 2011-10-02 NOTE — Progress Notes (Signed)
HOME HEALTH AGENCIES SERVING [place]   Agencies that are Medicare-Certified and are affiliated with [facility]  Telephone Number Address  [facility]   [facility] has ownership interest in this company; however, you are under no obligation to use this agency. [phone] or  [phone] [address] [place], [place] [address]   Agencies that are Medicare-Certified and are not affiliated with [facility]                                                                                 Home Health Agency Telephone Number Address  [facility] [phone] Fax [phone] [address], [address] [place], [place]  [address]  [facility] [phone] or [phone] Fax [phone] [address] [place], [place] [address]  [facility] [phone] Fax [phone] [address] [place], [place] [address]  [facility] [phone] Fax [phone] [address], [address] [place], [place]  [address]  [facility] [phone] Fax [phone] [address], [address] [place], [place] [address]  [facility] [phone] [address] , [place] [address]  [facility] [phone]  [address] [place], [place] [address]  [facility] [phone] or [phone] Fax [phone] [address] [place], [place]  [address]  [facility] [phone] Fax [phone] [address] [name], [place]  [address]  [facility]  [phone] Fax [phone] [address] [place], [place] [address]               Agencies that are not Medicare-Certified and are not affiliated with [facility]   Home Health Agency Telephone Number Address  [facility] [phone] or [phone] Fax [phone] [address] [place], [place]  [address]  [facility] [phone] Fax [phone] [address] [place], [place]  [address]  [facility]  [phone] Fax [phone] [address] [place], [place] [address]  [facility] [phone] Fax [phone] [address] [place], [place] [address]  [facility] [phone] or [phone] Fax [phone] [address] [place], [place]  [address]  [facility] [phone] or [phone] Fax [phone] [address] [place], [place]  [address]  [facility] [phone] Fax [phone] [address] [place], [place]  [address]  [facility] [phone] [address] [place], [place]  [address]  [facility] [phone] Fax [phone] [address] [place], [place]  [address]  [facility] [phone] Fax [phone] [address] [place], [place]  [address]  [facility] [phone] Fax [phone] [address] [place], [place] [address]  [facility] [phone] Fax [phone] [address] [place],   [address]   

## 2011-10-02 NOTE — Progress Notes (Signed)
Attempted to call Dr. Enos Fling office at [facility] message for him regarding patients current admit.  He has planned office visit on 9/4 and was tentatively scheduled for cystoscopy to look at previous bladder erosion.  Awaiting return call.     Canary Brim, NP-C [facility] Pgr: [phone] or [phone]

## 2011-10-02 NOTE — Progress Notes (Signed)
   SUBJECTIVE:  No chest pain.  No SOB.   PHYSICAL EXAM Filed Vitals:   10/02/11 0500 10/02/11 0545 10/02/11 0800 10/02/11 1140  BP:  122/65 128/79 139/93  Pulse:  74 71 73  Temp:   98.4 F (36.9 C) 97.9 F (36.6 C)  TempSrc:   Oral Oral  Resp:  25 20 20   Height:      Weight: 245 lb 13 oz (111.5 kg)     SpO2:  94% 95% 96%   General:  No distress Lungs:  Clear Heart:  RRR Abdomen:  Positive bowel sounds, no rebound no guarding Extremities:  No edema.    LABS: Lab Results  Component Value Date   CKTOTAL 395* 08/26/2009   CKMB 39.4 CRITICAL VALUE NOTED.  VALUE IS CONSISTENT WITH PREVIOUSLY REPORTED AND CALLED VALUE.* 08/26/2009   TROPONINI 0.35* 09/28/2011   Results for orders placed during the hospital encounter of 09/28/11 (from the past 24 hour(s))  CBC     Status: Normal   Collection Time   10/02/11  4:10 AM      Component Value Range   WBC 8.1  4.0 - 10.5 K/uL   RBC 4.89  4.22 - 5.81 MIL/uL   Hemoglobin 14.0  13.0 - 17.0 g/dL   HCT 78.2  95.6 - 21.3 %   MCV 82.6  78.0 - 100.0 fL   MCH 28.6  26.0 - 34.0 pg   MCHC 34.7  30.0 - 36.0 g/dL   RDW 08.6  57.8 - 46.9 %   Platelets 182  150 - 400 K/uL  PROTIME-INR     Status: Abnormal   Collection Time   10/02/11  4:10 AM      Component Value Range   Prothrombin Time 22.6 (*) 11.6 - 15.2 seconds   INR 1.95 (*) 0.00 - 1.49  HEPARIN LEVEL (UNFRACTIONATED)     Status: Abnormal   Collection Time   10/02/11  4:10 AM      Component Value Range   Heparin Unfractionated 0.21 (*) 0.30 - 0.70 IU/mL    Intake/Output Summary (Last 24 hours) at 10/02/11 1300 Last data filed at 10/02/11 1100  Gross per 24 hour  Intake 2416.85 ml  Output   2576 ml  Net -159.15 ml     ASSESSMENT AND PLAN:   Pulmonary embolism: INR still subtherapeutic.  He will need Lovenox overlap x 24 hours once the INR is greater than 2. He would likely prefer to stay in the hospital for this.  Elevated troponin:  Secondary to PE.  No further work up is  planned.      [name] 10/02/2011 1:00 PM

## 2011-10-02 NOTE — Progress Notes (Signed)
ANTICOAGULATION CONSULT NOTE - Follow Up Consult  Pharmacy Consult for Heparin/warfarin Indication: pulmonary embolus  Allergies  Allergen Reactions  . Darifenacin Hydrobromide Er Nausea Only    Dizziness    Patient Measurements: Height: 6\' 1"  (185.4 cm) Weight: 244 lb 4.3 oz (110.8 kg) IBW/kg (Calculated) : 79.9  Heparin Dosing Weight: 101.3 kg  Vital Signs: BP: 117/68 mmHg (08/25 1950) Pulse Rate: 72  (08/26 0400)  Labs:  Basename 10/02/11 0410 10/01/11 0306 09/30/11 1940 09/30/11 0400  HGB 14.0 15.2 -- --  HCT 40.4 43.2 -- 45.2  PLT 182 182 -- 156  APTT -- -- -- --  LABPROT 22.6* 18.8* -- 15.3*  INR 1.95* 1.54* -- 1.18  HEPARINUNFRC 0.21* 0.49 0.52 --  CREATININE -- -- -- 1.50*  CKTOTAL -- -- -- --  CKMB -- -- -- --  TROPONINI -- -- -- --    Estimated Creatinine Clearance: 53 ml/min (by C-G formula based on Cr of 1.5).  Assessment: 19 YOM admitted with respiratory distress and then VQ scan showed high probability of PE.  Heparin level (0.21) is below-goal on 2400 units/hr. INR (1.95) is trending up. No problem with line / infusion per RN.   Goal of Therapy:  INR 2-3 Heparin level 0.3-0.7 units/ml Monitor platelets by anticoagulation protocol: Yes   Plan:  1. Heparin IV bolus of 1500 units x 1, then increase IV heparin to 2600 units/hr.  2. Coumadin 5mg  po today.  3. Heparin level in 8 hours.   Lorre Munroe, PharmD 10/02/2011 5:38 AM

## 2011-10-03 DIAGNOSIS — I2699 Other pulmonary embolism without acute cor pulmonale: Secondary | ICD-10-CM | POA: Diagnosis not present

## 2011-10-03 DIAGNOSIS — R0603 Acute respiratory distress: Secondary | ICD-10-CM | POA: Diagnosis present

## 2011-10-03 DIAGNOSIS — R0902 Hypoxemia: Secondary | ICD-10-CM | POA: Diagnosis present

## 2011-10-03 LAB — BASIC METABOLIC PANEL
Calcium: 10 mg/dL (ref 8.4–10.5)
GFR calc Af Amer: 47 mL/min — ABNORMAL LOW (ref >90)
GFR calc non Af Amer: 40 mL/min — ABNORMAL LOW (ref >90)
Glucose, Bld: 112 mg/dL — ABNORMAL HIGH (ref 70–99)
Potassium: 3.9 mEq/L (ref 3.5–5.1)
Sodium: 137 mEq/L (ref 135–145)

## 2011-10-03 LAB — CBC
MCH: 28.5 pg (ref 26.0–34.0)
MCHC: 34.5 g/dL (ref 30.0–36.0)
Platelets: 209 10*3/uL (ref 150–400)
RBC: 4.87 MIL/uL (ref 4.22–5.81)

## 2011-10-03 LAB — PROTIME-INR: Prothrombin Time: 28.6 seconds — ABNORMAL HIGH (ref 11.6–15.2)

## 2011-10-03 MED ORDER — ENOXAPARIN SODIUM 120 MG/0.8ML ~~LOC~~ SOLN
110.0000 mg | Freq: Two times a day (BID) | SUBCUTANEOUS | Status: DC
  Filled 2011-10-03 (×2): qty 0.8

## 2011-10-03 MED ORDER — ENOXAPARIN (LOVENOX) PATIENT EDUCATION KIT
PACK | Freq: Once | Status: DC
  Filled 2011-10-03: qty 1

## 2011-10-03 MED ORDER — WARFARIN SODIUM 4 MG PO TABS
4.0000 mg | ORAL_TABLET | Freq: Once | ORAL | Status: AC
  Administered 2011-10-03: 4 mg via ORAL
  Filled 2011-10-03: qty 1

## 2011-10-03 MED ORDER — ENOXAPARIN SODIUM 120 MG/0.8ML ~~LOC~~ SOLN
110.0000 mg | Freq: Two times a day (BID) | SUBCUTANEOUS | Status: DC
  Administered 2011-10-03 – 2011-10-04 (×3): 110 mg via SUBCUTANEOUS
  Filled 2011-10-03 (×4): qty 0.8

## 2011-10-03 NOTE — Progress Notes (Signed)
Physical Therapy Treatment Patient Details Name: [name] MRN: [id number] DOB: 1931-12-26 Today's Date: 10/03/2011 Time: 6213-0865 PT Time Calculation (min): 21 min  PT Assessment / Plan / Recommendation Comments on Treatment Session  Pt adm with PE's.  Pt much less anxious and impulsive today.  Making good progress with mobility.    Follow Up Recommendations  Home health PT;Supervision - Intermittent    Barriers to Discharge        Equipment Recommendations   (rollator)    Recommendations for Other Services    Frequency Min 3X/week   Plan Discharge plan remains appropriate;Frequency remains appropriate    Precautions / Restrictions Precautions Precautions: Fall Restrictions Weight Bearing Restrictions: No   Pertinent Vitals/Pain  Initial O2 sat 97% on RA HR with activity 89 bpm O2 sat with activity 94% on RA Post O2 sat 95% on RA    Mobility  Bed Mobility Bed Mobility: Sit to Supine Supine to Sit: 4: Min assist;HOB elevated Sitting - Scoot to Edge of Bed: 5: Supervision Sit to Supine: 4: Min assist Details for Bed Mobility Assistance: Assist to bring trunk up due to sore rt. shoulder. Transfers Sit to Stand: 4: Min guard;With upper extremity assist;From bed Stand to Sit: 5: Supervision;With upper extremity assist;To bed Ambulation/Gait Ambulation/Gait Assistance: 5: Supervision Ambulation Distance (Feet): 170 Feet Assistive device: Rolling walker Ambulation/Gait Assistance Details: No cues needed. Gait Pattern: Step-through pattern;Decreased stride length Gait velocity: decr speed.    Exercises     PT Diagnosis:    PT Problem List:   PT Treatment Interventions:     PT Goals Acute Rehab PT Goals PT Goal: Supine/Side to Sit - Progress: Progressing toward goal PT Goal: Sit to Supine/Side - Progress: Progressing toward goal PT Goal: Sit to Stand - Progress: Progressing toward goal PT Goal: Stand to Sit - Progress: Progressing toward goal PT Goal:  Ambulate - Progress: Progressing toward goal  Visit Information  Last PT Received On: 10/03/11 Assistance Needed: +1    Subjective Data      Cognition  Overall Cognitive Status: Appears within functional limits for tasks assessed/performed Arousal/Alertness: Awake/alert Orientation Level: Appears intact for tasks assessed Behavior During Session: [facility] for tasks performed Cognition - Other Comments: Less impulsive today.  Only 1 cue needed to wait to stand until I got second gown on.    Balance  Static Standing Balance Static Standing - Balance Support: Bilateral upper extremity supported (on walker) Static Standing - Level of Assistance: 5: Stand by assistance  End of Session PT - End of Session Equipment Utilized During Treatment: Gait belt Activity Tolerance: Patient tolerated treatment well Patient left: in bed;with call bell/phone within reach;with family/visitor present Nurse Communication: Mobility status   GP     [name] 10/03/2011, 3:22 PM  [employer] PT [phone]

## 2011-10-03 NOTE — Progress Notes (Signed)
Received call back from Dr. Enos Fling PA - [facility].  His planned follow up visit it for a dye study only (to evaluate the eroded area and catheter) not cystoscopy.  Discussed coumadin administration and life long need for anticoagulation.  Will copy discharge summary to Dr. Vonita Moss upon discharge.     Canary Brim, NP-C [facility] Pgr: [phone] or [phone]

## 2011-10-03 NOTE — Progress Notes (Signed)
Pt's O2 sats on room air resting were 97%. Pt ambulated on room air and O2 sats were 94 % with ambulation. Pt's O2 sats were 95% on RA after activity. Pt tolerated activity well. Will continue to monitor.

## 2011-10-03 NOTE — Progress Notes (Signed)
Pt ambulated on Room air 545ft with rolling walker and tolerated activity well. Will continue to monitor.

## 2011-10-03 NOTE — Progress Notes (Addendum)
ANTICOAGULATION CONSULT NOTE - Follow Up Consult  Pharmacy Consult for Lovenox/warfarin Indication: pulmonary embolus  Allergies  Allergen Reactions  . Darifenacin Hydrobromide Er Nausea Only    Dizziness    Patient Measurements: Height: 6\' 1"  (185.4 cm) Weight: 233 lb 12.8 oz (106.051 kg) IBW/kg (Calculated) : 79.9   Vital Signs: Temp: 98.5 F (36.9 C) (08/27 0608) Temp src: Oral (08/27 0608) BP: 125/69 mmHg (08/27 0608) Pulse Rate: 65  (08/27 0608)  Labs:  Basename 10/03/11 0625 10/02/11 0410 10/01/11 0306 09/30/11 1940  HGB 13.9 14.0 -- --  HCT 40.3 40.4 43.2 --  PLT 209 182 182 --  APTT -- -- -- --  LABPROT 28.6* 22.6* 18.8* --  INR 2.64* 1.95* 1.54* --  HEPARINUNFRC -- 0.21* 0.49 0.52  CREATININE 1.58* -- -- --  CKTOTAL -- -- -- --  CKMB -- -- -- --  TROPONINI -- -- -- --    Estimated Creatinine Clearance: 49.3 ml/min (by C-G formula based on Cr of 1.58).  Assessment: 68 YOM admitted with high probability of PE per VQ scan.  Ttransitioned from IV heparin gtt to SQ lovenox yesterday. This is day #5/5 minimum overlap and today is 1st day with a  therapeutic INR. Needs Lovenox overlap x 24hr after INR >2. No bleeding reported. Pulmonologist plans to DC home today with Lovenox per my discussion with Anders Simmonds, NP.  INR increased from 1.95 to 2.64 today after 7.5mg , 7.5mg , 5mg  and 5mg . RN to educate patient on self administration of lovenox. Scr = 1.58,  CrCl ~ 49 ml/min, weight = 106 kg.  Goal of Therapy:  INR=2-3 Monitor platelets by anticoagulation protocol: Yes   Plan:  Continue Lovenox 110mg  sq q12h . Need to overlap x 24h with therapeutic INR. Recommend coumadin 5mg  alternating with 2.5mg  as INR is increasing. Check outpatient INR tomorrow or by Wed. 8/29.   Noah Delaine, RPh Clinical Pharmacist Pager: [phone] 10/03/2011 9:37 AM  Addendum:  Canary Brim, NP reported that patient is not going home today.  Will give lower dose of Coumadin 4mg  today.  INR in AM.   Noah Delaine, RPh Clinical Pharmacist Pager: [phone] 10/03/2011 12:11PM

## 2011-10-03 NOTE — Clinical Documentation Improvement (Signed)
RESPIRATORY FAILURE DOCUMENTATION CLARIFICATION QUERY   THIS DOCUMENT IS NOT A PERMANENT PART OF THE MEDICAL RECORD  TO RESPOND TO THE THIS QUERY, FOLLOW THE INSTRUCTIONS BELOW:  1. If needed, update documentation for the patient's encounter via the notes activity.  2. Access this query again and click edit on the In [employer].  3. After updating, or not, click F2 to complete all highlighted (required) fields concerning your review. Select "additional documentation in the medical record" OR "no additional documentation provided".  4. Click Sign note button.  5. The deficiency will fall out of your In Basket *Please let us know if you are not able to complete this workflow by phone or e-mail (listed below).  Please update your documentation within the medical record to reflect your response to this query.                                                                                    10/03/11  Dear Dr. Canary Brim Marton Redwood,  In a better effort to capture your patient's severity of illness, reflect appropriate length of stay and utilization of resources, a review of the patient medical record has revealed the following indicators. Based on your clinical judgment, please clarify and document in a progress note and/or discharge summary the clinical condition associated with the following supporting information:In responding to this query please exercise your independent judgment.  The fact that a query is asked, does not imply that any particular answer is desired or expected.  Possible Clinical Conditions?  Acute Respiratory Failure  Acute Respiratory Insufficiency  Acute Respiratory Insufficiency following surgery or trauma  Other Condition  Cannot Clinically Determine    Supporting Information:  Clinical Information:  Risk Factors: recent surgery, PE  Signs&Symptoms: Per EMS: pulse ox: 89% upon arrival. 11:21 am oxygen NRB; 11:37 am O2 at 4 l/m [place]. "Pt here per [facility]  with c/o becoming short of breath upon exertion. Pt. Advises he's had a few episodes of same starting yesterday morning. Respiratory distress relieved by rest. [facility] transported with O2 and IV started" per RN note 8/22. Positive for light-headedness Respiratory: Positive for shortness of breath SpO2 91% per ED "The patient notes sudden onset of shortness of breath and dyspnea on exertion the date of admission" per progress note 8/23; (Wall) The patient reports experiencing sudden DOE and shortness of breath at rest beginning yesterday. He reports he ambulated several feet in the morning to give his dog a treat and became markedly short of breath. This persisted at rest, thus prompting his ED arrival per progress note: 8/23; SOB with exertion, but O2 sats WNL per RN 8/23; presented to [facility] ED 8/22 with a two day history of shortness of breath with acute worsening day of presentation per Jodelle Green;  basilar crackles per progress note: 8/25; lungs bilaterally diminished; per 8/27  Diagnostics:  Radiology: IMPRESSION: 1. Examination is high probability for acute pulmonary embolus.   Treatment: O2 Mode: O2 @ 2-3 L/min  Nursing:  Patient ambulated about 157ft. Had some wheezing on ambulation, oxygen fluctuated between 88, 89, 90, 92  10/02/11 1756 10/02/11 1800 10/02/11 1803  Oxygen Therapy  SpO2 92 % (  on ambulation) ! 89 % (while ambulating) 90 % (while ambulating)  O2 Device Nasal cannula Nasal cannula Nasal cannula  O2 Flow Rate (L/min) 3 L/min 3 L/min 3 L/min   You may use possible, probable, or suspect with inpatient documentation. possible, probable, suspected diagnoses MUST be documented at the time of discharge  Reviewed: additional documentation in the medical record  Thank You,  Amada Kingfisher  RN, BSN, CCM  Clinical Documentation Specialist: [email or web address] [phone]  [facility]

## 2011-10-03 NOTE — Progress Notes (Signed)
    SUBJECTIVE:  No chest pain.  No SOB.   PHYSICAL EXAM Filed Vitals:   10/02/11 1800 10/02/11 1803 10/02/11 2038 10/03/11 0608  BP:   125/79 125/69  Pulse:   69 65  Temp:   98.6 F (37 C) 98.5 F (36.9 C)  TempSrc:   Oral Oral  Resp:   20 24  Height:      Weight:    233 lb 12.8 oz (106.051 kg)  SpO2: 89% 90% 98% 96%   General:  No distress Lungs:  Clear Heart:  RRR Abdomen:  Positive bowel sounds, no rebound no guarding Extremities:  No edema.    LABS:  Results for orders placed during the hospital encounter of 09/28/11 (from the past 24 hour(s))  PROTIME-INR     Status: Abnormal   Collection Time   10/03/11  6:25 AM      Component Value Range   Prothrombin Time 28.6 (*) 11.6 - 15.2 seconds   INR 2.64 (*) 0.00 - 1.49  BASIC METABOLIC PANEL     Status: Abnormal   Collection Time   10/03/11  6:25 AM      Component Value Range   Sodium 137  135 - 145 mEq/L   Potassium 3.9  3.5 - 5.1 mEq/L   Chloride 102  96 - 112 mEq/L   CO2 27  19 - 32 mEq/L   Glucose, Bld 112 (*) 70 - 99 mg/dL   BUN 18  6 - 23 mg/dL   Creatinine, Ser 4.09 (*) 0.50 - 1.35 mg/dL   Calcium 81.1  8.4 - 91.4 mg/dL   GFR calc non Af Amer 40 (*) >90 mL/min   GFR calc Af Amer 47 (*) >90 mL/min  CBC     Status: Normal   Collection Time   10/03/11  6:25 AM      Component Value Range   WBC 7.8  4.0 - 10.5 K/uL   RBC 4.87  4.22 - 5.81 MIL/uL   Hemoglobin 13.9  13.0 - 17.0 g/dL   HCT 78.2  95.6 - 21.3 %   MCV 82.8  78.0 - 100.0 fL   MCH 28.5  26.0 - 34.0 pg   MCHC 34.5  30.0 - 36.0 g/dL   RDW 08.6  57.8 - 46.9 %   Platelets 209  150 - 400 K/uL    Intake/Output Summary (Last 24 hours) at 10/03/11 0844 Last data filed at 10/03/11 0541  Gross per 24 hour  Intake    532 ml  Output   2001 ml  Net  -1469 ml     ASSESSMENT AND PLAN:  Pulmonary embolism: INR therapeutic.  Guidelines suggest 48 hour overlap with Lovenox. He would likely prefer to stay in the hospital for this.  I will defer to Dr.  Vassie Loll.  He can be followed in our Warfarin Clinic when he is discharged.    Elevated troponin:  Secondary to PE.  No further work up is planned.      Fayrene Fearing [facility] 10/03/2011 8:44 AM

## 2011-10-03 NOTE — Progress Notes (Signed)
[facility] Clinic for 3rd time.  Left message (no patient data) on Romilda Garret (office assistant) answering machine for office to call back.     Urology Fax Number - [phone]   Canary Brim, NP-C [facility] Pgr: [phone] or [phone]

## 2011-10-03 NOTE — Clinical Documentation Improvement (Signed)
RENAL FAILURE DOCUMENTATION CLARIFICATION QUERY  THIS DOCUMENT IS NOT A PERMANENT PART OF THE MEDICAL RECORD  TO RESPOND TO THE THIS QUERY, FOLLOW THE INSTRUCTIONS BELOW:  1. If needed, update documentation for the patient's encounter via the notes activity.  2. Access this query again and click edit on the In [employer].  3. After updating, or not, click F2 to complete all highlighted (required) fields concerning your review. Select "additional documentation in the medical record" OR "no additional documentation provided".  4. Click Sign note button.  5. The deficiency will fall out of your In Basket *Please let us know if you are not able to complete this workflow by phone or e-mail (listed below).  Please update your documentation within the medical record to reflect your response to this query.                                                                                    10/03/11  Dear Renelda Mom  In a better effort to capture your patient's severity of illness, reflect appropriate length of stay and utilization of resources, a review of the patient medical record has revealed the following indicators. Based on your clinical judgment, please clarify and document in a progress note and/or discharge summary the clinical condition associated with the following supporting information:In responding to this query please exercise your independent judgment.  The fact that a query is asked, does not imply that any particular answer is desired or expected.  Possible Clinical Conditions?   Acute Renal Failure/Acute Kidney Injury  Acute Tubular Necrosis  Acute on Chronic Renal Failure  Chronic Renal Failure  Other Condition  Cannot Clinically Determine   Supporting Information: Clinical Information:   Risk Factors: PE Signs: Diagnostics: -Labs: -Current Creatinine Level (trend):Results for [place], PAT (MRN [id number]) as of 10/03/2011 12:31 08/28/2009  05:45 Creatinine: 1.28 09/28/2011 12:20 Creatinine: 1.88 (H) 09/29/2011 03:45 Creatinine: 1.70 (H) 09/30/2011 04:00 Creatinine: 1.50 (H) 10/03/2011 06:25 Creatinine: 1.58 (H)  -Current BUN Level (trend):Results for Semmel, PAT (MRN [id number]) as of 10/03/2011 12:31 08/28/2009 05:45 BUN: 15 09/28/2011 12:20 BUN: 30 (H) 09/29/2011 03:45 BUN: 29 (H) 09/30/2011 04:00 BUN: 25 (H) 10/03/2011 06:25 BUN: 18 -[id number] for [place], PAT (MRN [id number]) as of 10/03/2011 12:31 08/28/2009 05:45 GFR calc non Af Amer: 55 (L) 09/28/2011 12:20 GFR calc non Af Amer: 33 (L) 09/29/2011 03:45 GFR calc non Af Amer: 37 (L) 09/30/2011 04:00 GFR calc non Af Amer: 43 (L) 10/03/2011 06:25 GFR calc non Af Amer: 40 (L)  Treatments: monitor/assess  You may use possible, probable, or suspect with inpatient documentation. possible, probable, suspected diagnoses MUST be documented at the time of discharge  Reviewed: additional documentation in the medical record  Thank You,  Carlena Sax, BSN, CCM   Clinical Documentation Specialist: [email or web address] [phone]  Health Information Management [place]    Took care of it.  Thanks.

## 2011-10-04 DIAGNOSIS — I82409 Acute embolism and thrombosis of unspecified deep veins of unspecified lower extremity: Secondary | ICD-10-CM | POA: Diagnosis not present

## 2011-10-04 DIAGNOSIS — N179 Acute kidney failure, unspecified: Secondary | ICD-10-CM | POA: Diagnosis not present

## 2011-10-04 DIAGNOSIS — I2510 Atherosclerotic heart disease of native coronary artery without angina pectoris: Secondary | ICD-10-CM | POA: Diagnosis not present

## 2011-10-04 DIAGNOSIS — I2699 Other pulmonary embolism without acute cor pulmonale: Secondary | ICD-10-CM | POA: Diagnosis not present

## 2011-10-04 LAB — FACTOR 5 ASSAY: Factor V Activity: 88 % (ref 65–150)

## 2011-10-04 LAB — PROTIME-INR: Prothrombin Time: 32.7 seconds — ABNORMAL HIGH (ref 11.6–15.2)

## 2011-10-04 MED ORDER — POLYETHYLENE GLYCOL 3350 17 G PO PACK: 17.0000 g | PACK | Freq: Two times a day (BID) | ORAL | Status: AC

## 2011-10-04 MED ORDER — DSS 100 MG PO CAPS: 100.0000 mg | ORAL_CAPSULE | Freq: Two times a day (BID) | ORAL | Status: AC | PRN

## 2011-10-04 MED ORDER — WARFARIN SODIUM 5 MG PO TABS: ORAL_TABLET | ORAL | Status: DC

## 2011-10-04 NOTE — Progress Notes (Signed)
    SUBJECTIVE:  No chest pain.  No SOB.  Oxygenating much better.  Ambulated.     PHYSICAL EXAM Filed Vitals:   10/03/11 1433 10/03/11 1435 10/03/11 1949 10/04/11 0534  BP:  130/62 117/71 122/72  Pulse: 88 71 75 66  Temp:  98.6 F (37 C) 98.6 F (37 C) 98.6 F (37 C)  TempSrc:  Oral Oral Oral  Resp:  19 20 18   Height:      Weight:    234 lb 5.6 oz (106.3 kg)  SpO2: 95% 97% 95% 95%   General:  No distress Lungs:  Clear Heart:  RRR Abdomen:  Positive bowel sounds, no rebound no guarding Extremities:  No edema.    LABS:  Results for orders placed during the hospital encounter of 09/28/11 (from the past 24 hour(s))  PROTIME-INR     Status: Abnormal   Collection Time   10/04/11  6:00 AM      Component Value Range   Prothrombin Time 32.7 (*) 11.6 - 15.2 seconds   INR 3.13 (*) 0.00 - 1.49    Intake/Output Summary (Last 24 hours) at 10/04/11 0820 Last data filed at 10/04/11 0543  Gross per 24 hour  Intake    600 ml  Output   2600 ml  Net  -2000 ml     ASSESSMENT AND PLAN:  Pulmonary embolism: INR therapeutic.  Guidelines suggest 48 hour overlap with Lovenox which would end today.  I will ask Lane Hacker to schedule warfarin follow up in our clinic.  Please refer to appt schedule.  Elevated troponin:  Secondary to PE.  No further work up is planned.  I will see him in about three months.    Rollene Rotunda 10/04/2011 8:20 AM

## 2011-10-04 NOTE — Progress Notes (Signed)
Discharge instructions given to pt and pt's family. Prescriptions given to pt. Pt verbalized understanding of discharge instructions. D/Ced IV, no bleeding noted. Ready for discharge with wife.

## 2011-10-06 ENCOUNTER — Ambulatory Visit (INDEPENDENT_AMBULATORY_CARE_PROVIDER_SITE_OTHER): Payer: Medicare Other | Admitting: Pharmacist

## 2011-10-06 DIAGNOSIS — Z7901 Long term (current) use of anticoagulants: Secondary | ICD-10-CM

## 2011-10-06 DIAGNOSIS — I2699 Other pulmonary embolism without acute cor pulmonale: Secondary | ICD-10-CM | POA: Diagnosis not present

## 2011-10-06 DIAGNOSIS — I82409 Acute embolism and thrombosis of unspecified deep veins of unspecified lower extremity: Secondary | ICD-10-CM

## 2011-10-06 HISTORY — DX: Long term (current) use of anticoagulants: Z79.01

## 2011-10-06 NOTE — Patient Instructions (Signed)

## 2011-10-11 ENCOUNTER — Ambulatory Visit: Payer: Self-pay | Admitting: Pharmacist

## 2011-10-11 DIAGNOSIS — N368 Other specified disorders of urethra: Secondary | ICD-10-CM | POA: Diagnosis not present

## 2011-10-11 DIAGNOSIS — R32 Unspecified urinary incontinence: Secondary | ICD-10-CM | POA: Diagnosis not present

## 2011-10-11 DIAGNOSIS — N360 Urethral fistula: Secondary | ICD-10-CM | POA: Diagnosis not present

## 2011-10-13 ENCOUNTER — Ambulatory Visit (INDEPENDENT_AMBULATORY_CARE_PROVIDER_SITE_OTHER): Payer: Medicare Other | Admitting: Pharmacist

## 2011-10-13 DIAGNOSIS — I82409 Acute embolism and thrombosis of unspecified deep veins of unspecified lower extremity: Secondary | ICD-10-CM | POA: Diagnosis not present

## 2011-10-13 DIAGNOSIS — I2699 Other pulmonary embolism without acute cor pulmonale: Secondary | ICD-10-CM

## 2011-10-13 DIAGNOSIS — Z7901 Long term (current) use of anticoagulants: Secondary | ICD-10-CM

## 2011-10-13 LAB — POCT INR: INR: 4.8

## 2011-10-19 DIAGNOSIS — C61 Malignant neoplasm of prostate: Secondary | ICD-10-CM | POA: Diagnosis not present

## 2011-10-19 DIAGNOSIS — N183 Chronic kidney disease, stage 3 unspecified: Secondary | ICD-10-CM | POA: Diagnosis not present

## 2011-10-19 DIAGNOSIS — I2699 Other pulmonary embolism without acute cor pulmonale: Secondary | ICD-10-CM | POA: Diagnosis not present

## 2011-10-19 DIAGNOSIS — I10 Essential (primary) hypertension: Secondary | ICD-10-CM | POA: Diagnosis not present

## 2011-10-20 ENCOUNTER — Ambulatory Visit (INDEPENDENT_AMBULATORY_CARE_PROVIDER_SITE_OTHER): Payer: Medicare Other | Admitting: *Deleted

## 2011-10-20 DIAGNOSIS — Z7901 Long term (current) use of anticoagulants: Secondary | ICD-10-CM | POA: Diagnosis not present

## 2011-10-20 DIAGNOSIS — I2699 Other pulmonary embolism without acute cor pulmonale: Secondary | ICD-10-CM | POA: Diagnosis not present

## 2011-10-20 DIAGNOSIS — I82409 Acute embolism and thrombosis of unspecified deep veins of unspecified lower extremity: Secondary | ICD-10-CM

## 2011-10-20 LAB — POCT INR: INR: 2.3

## 2011-10-27 ENCOUNTER — Ambulatory Visit (INDEPENDENT_AMBULATORY_CARE_PROVIDER_SITE_OTHER): Payer: Medicare Other | Admitting: *Deleted

## 2011-10-27 DIAGNOSIS — Z7901 Long term (current) use of anticoagulants: Secondary | ICD-10-CM | POA: Diagnosis not present

## 2011-10-27 DIAGNOSIS — I82409 Acute embolism and thrombosis of unspecified deep veins of unspecified lower extremity: Secondary | ICD-10-CM

## 2011-10-27 DIAGNOSIS — I2699 Other pulmonary embolism without acute cor pulmonale: Secondary | ICD-10-CM | POA: Diagnosis not present

## 2011-10-27 LAB — POCT INR: INR: 2.8

## 2011-10-31 DIAGNOSIS — N368 Other specified disorders of urethra: Secondary | ICD-10-CM | POA: Diagnosis not present

## 2011-11-10 ENCOUNTER — Ambulatory Visit (INDEPENDENT_AMBULATORY_CARE_PROVIDER_SITE_OTHER): Payer: Medicare Other | Admitting: Pharmacist

## 2011-11-10 DIAGNOSIS — I82409 Acute embolism and thrombosis of unspecified deep veins of unspecified lower extremity: Secondary | ICD-10-CM | POA: Diagnosis not present

## 2011-11-10 DIAGNOSIS — Z7901 Long term (current) use of anticoagulants: Secondary | ICD-10-CM

## 2011-11-10 DIAGNOSIS — I2699 Other pulmonary embolism without acute cor pulmonale: Secondary | ICD-10-CM | POA: Diagnosis not present

## 2011-11-27 DIAGNOSIS — R351 Nocturia: Secondary | ICD-10-CM | POA: Diagnosis not present

## 2011-11-27 DIAGNOSIS — N3945 Continuous leakage: Secondary | ICD-10-CM | POA: Diagnosis not present

## 2011-11-27 DIAGNOSIS — Z8546 Personal history of malignant neoplasm of prostate: Secondary | ICD-10-CM | POA: Diagnosis not present

## 2011-11-27 DIAGNOSIS — C61 Malignant neoplasm of prostate: Secondary | ICD-10-CM | POA: Diagnosis not present

## 2011-11-27 DIAGNOSIS — N183 Chronic kidney disease, stage 3 unspecified: Secondary | ICD-10-CM | POA: Diagnosis not present

## 2011-12-01 ENCOUNTER — Ambulatory Visit (INDEPENDENT_AMBULATORY_CARE_PROVIDER_SITE_OTHER): Payer: Medicare Other | Admitting: Pharmacist

## 2011-12-01 DIAGNOSIS — I82409 Acute embolism and thrombosis of unspecified deep veins of unspecified lower extremity: Secondary | ICD-10-CM | POA: Diagnosis not present

## 2011-12-01 DIAGNOSIS — Z7901 Long term (current) use of anticoagulants: Secondary | ICD-10-CM

## 2011-12-01 DIAGNOSIS — I2699 Other pulmonary embolism without acute cor pulmonale: Secondary | ICD-10-CM | POA: Diagnosis not present

## 2011-12-01 LAB — POCT INR: INR: 3.1

## 2011-12-22 ENCOUNTER — Ambulatory Visit (INDEPENDENT_AMBULATORY_CARE_PROVIDER_SITE_OTHER): Payer: Medicare Other

## 2011-12-22 DIAGNOSIS — I82409 Acute embolism and thrombosis of unspecified deep veins of unspecified lower extremity: Secondary | ICD-10-CM

## 2011-12-22 DIAGNOSIS — I2699 Other pulmonary embolism without acute cor pulmonale: Secondary | ICD-10-CM

## 2011-12-22 DIAGNOSIS — Z7901 Long term (current) use of anticoagulants: Secondary | ICD-10-CM | POA: Diagnosis not present

## 2011-12-22 LAB — POCT INR: INR: 2.8

## 2011-12-25 DIAGNOSIS — N393 Stress incontinence (female) (male): Secondary | ICD-10-CM | POA: Diagnosis not present

## 2011-12-27 DIAGNOSIS — N393 Stress incontinence (female) (male): Secondary | ICD-10-CM | POA: Diagnosis not present

## 2011-12-28 ENCOUNTER — Encounter: Payer: Self-pay | Admitting: Cardiology

## 2011-12-28 ENCOUNTER — Ambulatory Visit (INDEPENDENT_AMBULATORY_CARE_PROVIDER_SITE_OTHER): Payer: Medicare Other | Admitting: Cardiology

## 2011-12-28 VITALS — BP 152/79 | HR 69 | Ht 73.0 in | Wt 237.0 lb

## 2011-12-28 DIAGNOSIS — I2510 Atherosclerotic heart disease of native coronary artery without angina pectoris: Secondary | ICD-10-CM

## 2011-12-28 DIAGNOSIS — E782 Mixed hyperlipidemia: Secondary | ICD-10-CM

## 2011-12-28 DIAGNOSIS — I10 Essential (primary) hypertension: Secondary | ICD-10-CM

## 2011-12-28 DIAGNOSIS — I2699 Other pulmonary embolism without acute cor pulmonale: Secondary | ICD-10-CM

## 2011-12-28 MED ORDER — NITROGLYCERIN 0.4 MG SL SUBL: 0.4000 mg | SUBLINGUAL_TABLET | SUBLINGUAL | Status: DC | PRN

## 2011-12-28 NOTE — Progress Notes (Signed)
HPI The patient presents for followup. He had stenting of his circumflex in July of last 2011.  Since I last saw him he was hospitalized with shortness of breath and found to have pulmonary embolism. He was restarted back on warfarin which he had been on in the past following a DVT. During that admission he did have an echocardiogram which demonstrated some mildly reduced systolic right ventricular function. The patient had no apparent cardiac issues during this hospitalization and I have reviewed the labs and all available hospital records. Since going home he's been getting back a little bit to the treadmill in the gym. He has not been having any issues with this and denies any new shortness of breath, PND or orthopnea. He has had no palpitations, presyncope or syncope. He denies any chest pressure, neck or arm discomfort. His biggest issue has been urinary incontinence and he is likely to need a sphincter placed for this.  Allergies  Allergen Reactions  . Darifenacin Hydrobromide Er Nausea Only    Dizziness    Current Outpatient Prescriptions  Medication Sig Dispense Refill  . aspirin 81 MG tablet Take 81 mg by mouth daily.      . Cyanocobalamin (B-12) 1000 MCG CAPS 1 tab po qd       . Diphenhydramine-APAP, sleep, (EXCEDRIN PM PO) as needed.        Marland Kitchen esomeprazole (NEXIUM) 40 MG capsule Take 40 mg by mouth daily before breakfast.        . fenofibrate 160 MG tablet ONE DAILY  90 tablet  4  . Misc Natural Products (GLUCOSAMINE CHONDROITIN ADV PO) 1 tab po qd       . Multiple Vitamin (MULTIVITAMIN) capsule Take 1 capsule by mouth daily.        . nitroGLYCERIN (NITROSTAT) 0.4 MG SL tablet Place 0.4 mg under the tongue every 5 (five) minutes as needed.        . Omega-3 Fatty Acids (FISH OIL CONCENTRATE PO) Take 2 capsules by mouth daily. 1 tab po qd      . pravastatin (PRAVACHOL) 20 MG tablet TAKE 1 TABLET EVERY DAY  30 tablet  9  . warfarin (COUMADIN) 5 MG tablet Take 2.5 mg tonight 8/28,  and 5 mg 8/29.  Follow up in coumadin clinic on 8/30  60 tablet  3    Past Medical History  Diagnosis Date  . HYPERTENSION   . CAD, NATIVE VESSEL     PCI and DES to circumflex  . DVT 2006    on coumadin x1 year, IVC filter placed  . Mixed hyperlipidemia   . PROSTATE CANCER   . GERD   . Prostate cancer     s/p mutliple urologic procedures  . MI (myocardial infarction)     Past Surgical History  Procedure Date  . Prostatectomy   . Knee surgery   . Hernia repair   . Vena cava filter placement   . Cardiac catheterization 2011    DES-mid LCx 08/2009; 30% pLAD, 25% m/dLAD, 75% ostial D1, 99% mLCx s/p DES, 25-30% p/mRCA, 40% mRCA, 30% mPDA stenoses; LVEF 50%, inf hypokinesis    ROS:  Urinary incontinence. Otherwise as stated in the HPI and negative for all other systems.  PHYSICAL EXAM BP 152/79  Pulse 69  Ht 6\' 1"  (1.854 m)  Wt 237 lb (107.502 kg)  BMI 31.27 kg/m2 GENERAL:  Well appearing HEENT:  Pupils equal round and reactive, fundi not visualized, oral mucosa unremarkable NECK:  No jugular venous distention, waveform within normal limits, carotid upstroke brisk and symmetric, no bruits, no thyromegaly LYMPHATICS:  No cervical, inguinal adenopathy LUNGS:  Clear to auscultation bilaterally BACK:  No CVA tenderness CHEST:  Unremarkable HEART:  PMI not displaced or sustained,S1 and S2 within normal limits, no S3, no S4, no clicks, no rubs, soft apical systolic early peaking murmur, no diastolic murmurs ABD:  Flat, positive bowel sounds normal in frequency in pitch, no bruits, no rebound, no guarding, no midline pulsatile mass, no hepatomegaly, no splenomegaly, obese EXT:  2 plus pulses throughout, no edema, no cyanosis no clubbing SKIN:  No rashes no nodules NEURO:  Cranial nerves II through XII grossly intact, motor grossly intact throughout PSYCH:  Cognitively intact, oriented to person place and time   EKG:  Sinus rhythm, rate 70, first degree AV block, early transition  in lead V2, ventricular ectopy, nonspecific T-wave flattening. 12/28/2011   ASSESSMENT AND PLAN  DVT I did discuss with the patient he will be on lifetime anticoagulation with bridging Lovenox as needed in the future.  CAD, NATIVE VESSEL -  The patient has no new sypmtoms. No further cardiovascular testing is indicated. We will continue with aggressive risk reduction and meds as listed.   HYPERTENSION -  His blood pressure is elevated. However, on a home diary is not elevated and he has a reliable blood pressure cuff. We will continue the meds as listed.   MIXED HYPERLIPIDEMIA -  His last lipid included an LDL of 66 and HDL of 34.9. I think he should continue on the meds as listed. This

## 2011-12-28 NOTE — Patient Instructions (Addendum)
Your physician recommends that you continue on your current medications as directed. Please refer to the Current Medication list given to you today.  Your physician wants you to follow-up in: 1 year with Dr. Hochrein. You will receive a reminder letter in the mail two months in advance. If you don't receive a letter, please call our office to schedule the follow-up appointment.  

## 2012-01-08 DIAGNOSIS — M25559 Pain in unspecified hip: Secondary | ICD-10-CM | POA: Diagnosis not present

## 2012-01-08 DIAGNOSIS — M5137 Other intervertebral disc degeneration, lumbosacral region: Secondary | ICD-10-CM | POA: Diagnosis not present

## 2012-01-08 DIAGNOSIS — M999 Biomechanical lesion, unspecified: Secondary | ICD-10-CM | POA: Diagnosis not present

## 2012-01-09 DIAGNOSIS — M999 Biomechanical lesion, unspecified: Secondary | ICD-10-CM | POA: Diagnosis not present

## 2012-01-09 DIAGNOSIS — M5137 Other intervertebral disc degeneration, lumbosacral region: Secondary | ICD-10-CM | POA: Diagnosis not present

## 2012-01-09 DIAGNOSIS — M25559 Pain in unspecified hip: Secondary | ICD-10-CM | POA: Diagnosis not present

## 2012-01-19 ENCOUNTER — Ambulatory Visit (INDEPENDENT_AMBULATORY_CARE_PROVIDER_SITE_OTHER): Payer: Medicare Other | Admitting: *Deleted

## 2012-01-19 DIAGNOSIS — Z7901 Long term (current) use of anticoagulants: Secondary | ICD-10-CM

## 2012-01-19 DIAGNOSIS — I82409 Acute embolism and thrombosis of unspecified deep veins of unspecified lower extremity: Secondary | ICD-10-CM

## 2012-01-19 DIAGNOSIS — I2699 Other pulmonary embolism without acute cor pulmonale: Secondary | ICD-10-CM | POA: Diagnosis not present

## 2012-01-22 DIAGNOSIS — M5137 Other intervertebral disc degeneration, lumbosacral region: Secondary | ICD-10-CM | POA: Diagnosis not present

## 2012-01-22 DIAGNOSIS — M999 Biomechanical lesion, unspecified: Secondary | ICD-10-CM | POA: Diagnosis not present

## 2012-01-22 DIAGNOSIS — M25559 Pain in unspecified hip: Secondary | ICD-10-CM | POA: Diagnosis not present

## 2012-01-23 DIAGNOSIS — M25559 Pain in unspecified hip: Secondary | ICD-10-CM | POA: Diagnosis not present

## 2012-01-23 DIAGNOSIS — M5137 Other intervertebral disc degeneration, lumbosacral region: Secondary | ICD-10-CM | POA: Diagnosis not present

## 2012-01-23 DIAGNOSIS — M999 Biomechanical lesion, unspecified: Secondary | ICD-10-CM | POA: Diagnosis not present

## 2012-01-25 DIAGNOSIS — M25559 Pain in unspecified hip: Secondary | ICD-10-CM | POA: Diagnosis not present

## 2012-01-25 DIAGNOSIS — M999 Biomechanical lesion, unspecified: Secondary | ICD-10-CM | POA: Diagnosis not present

## 2012-01-25 DIAGNOSIS — M5137 Other intervertebral disc degeneration, lumbosacral region: Secondary | ICD-10-CM | POA: Diagnosis not present

## 2012-02-05 DIAGNOSIS — M25559 Pain in unspecified hip: Secondary | ICD-10-CM | POA: Diagnosis not present

## 2012-02-05 DIAGNOSIS — M999 Biomechanical lesion, unspecified: Secondary | ICD-10-CM | POA: Diagnosis not present

## 2012-02-05 DIAGNOSIS — M5137 Other intervertebral disc degeneration, lumbosacral region: Secondary | ICD-10-CM | POA: Diagnosis not present

## 2012-02-06 DIAGNOSIS — M999 Biomechanical lesion, unspecified: Secondary | ICD-10-CM | POA: Diagnosis not present

## 2012-02-06 DIAGNOSIS — M5137 Other intervertebral disc degeneration, lumbosacral region: Secondary | ICD-10-CM | POA: Diagnosis not present

## 2012-02-06 DIAGNOSIS — M25559 Pain in unspecified hip: Secondary | ICD-10-CM | POA: Diagnosis not present

## 2012-02-08 DIAGNOSIS — M25559 Pain in unspecified hip: Secondary | ICD-10-CM | POA: Diagnosis not present

## 2012-02-08 DIAGNOSIS — M5137 Other intervertebral disc degeneration, lumbosacral region: Secondary | ICD-10-CM | POA: Diagnosis not present

## 2012-02-08 DIAGNOSIS — M999 Biomechanical lesion, unspecified: Secondary | ICD-10-CM | POA: Diagnosis not present

## 2012-02-12 DIAGNOSIS — M5137 Other intervertebral disc degeneration, lumbosacral region: Secondary | ICD-10-CM | POA: Diagnosis not present

## 2012-02-12 DIAGNOSIS — M25559 Pain in unspecified hip: Secondary | ICD-10-CM | POA: Diagnosis not present

## 2012-02-12 DIAGNOSIS — M999 Biomechanical lesion, unspecified: Secondary | ICD-10-CM | POA: Diagnosis not present

## 2012-02-13 DIAGNOSIS — M999 Biomechanical lesion, unspecified: Secondary | ICD-10-CM | POA: Diagnosis not present

## 2012-02-13 DIAGNOSIS — M25559 Pain in unspecified hip: Secondary | ICD-10-CM | POA: Diagnosis not present

## 2012-02-13 DIAGNOSIS — M5137 Other intervertebral disc degeneration, lumbosacral region: Secondary | ICD-10-CM | POA: Diagnosis not present

## 2012-02-15 DIAGNOSIS — M5137 Other intervertebral disc degeneration, lumbosacral region: Secondary | ICD-10-CM | POA: Diagnosis not present

## 2012-02-15 DIAGNOSIS — M999 Biomechanical lesion, unspecified: Secondary | ICD-10-CM | POA: Diagnosis not present

## 2012-02-15 DIAGNOSIS — M25559 Pain in unspecified hip: Secondary | ICD-10-CM | POA: Diagnosis not present

## 2012-02-19 DIAGNOSIS — M999 Biomechanical lesion, unspecified: Secondary | ICD-10-CM | POA: Diagnosis not present

## 2012-02-19 DIAGNOSIS — M25559 Pain in unspecified hip: Secondary | ICD-10-CM | POA: Diagnosis not present

## 2012-02-19 DIAGNOSIS — M5137 Other intervertebral disc degeneration, lumbosacral region: Secondary | ICD-10-CM | POA: Diagnosis not present

## 2012-02-20 DIAGNOSIS — M5137 Other intervertebral disc degeneration, lumbosacral region: Secondary | ICD-10-CM | POA: Diagnosis not present

## 2012-02-20 DIAGNOSIS — M25559 Pain in unspecified hip: Secondary | ICD-10-CM | POA: Diagnosis not present

## 2012-02-20 DIAGNOSIS — M999 Biomechanical lesion, unspecified: Secondary | ICD-10-CM | POA: Diagnosis not present

## 2012-02-22 DIAGNOSIS — M999 Biomechanical lesion, unspecified: Secondary | ICD-10-CM | POA: Diagnosis not present

## 2012-02-22 DIAGNOSIS — M5137 Other intervertebral disc degeneration, lumbosacral region: Secondary | ICD-10-CM | POA: Diagnosis not present

## 2012-02-22 DIAGNOSIS — M25559 Pain in unspecified hip: Secondary | ICD-10-CM | POA: Diagnosis not present

## 2012-02-23 ENCOUNTER — Ambulatory Visit (INDEPENDENT_AMBULATORY_CARE_PROVIDER_SITE_OTHER): Payer: Medicare Other | Admitting: *Deleted

## 2012-02-23 DIAGNOSIS — I2699 Other pulmonary embolism without acute cor pulmonale: Secondary | ICD-10-CM | POA: Diagnosis not present

## 2012-02-23 DIAGNOSIS — Z7901 Long term (current) use of anticoagulants: Secondary | ICD-10-CM

## 2012-02-23 DIAGNOSIS — I82409 Acute embolism and thrombosis of unspecified deep veins of unspecified lower extremity: Secondary | ICD-10-CM

## 2012-02-26 DIAGNOSIS — M999 Biomechanical lesion, unspecified: Secondary | ICD-10-CM | POA: Diagnosis not present

## 2012-02-26 DIAGNOSIS — M25559 Pain in unspecified hip: Secondary | ICD-10-CM | POA: Diagnosis not present

## 2012-02-26 DIAGNOSIS — M5137 Other intervertebral disc degeneration, lumbosacral region: Secondary | ICD-10-CM | POA: Diagnosis not present

## 2012-02-27 DIAGNOSIS — M5137 Other intervertebral disc degeneration, lumbosacral region: Secondary | ICD-10-CM | POA: Diagnosis not present

## 2012-02-27 DIAGNOSIS — M999 Biomechanical lesion, unspecified: Secondary | ICD-10-CM | POA: Diagnosis not present

## 2012-02-27 DIAGNOSIS — M25559 Pain in unspecified hip: Secondary | ICD-10-CM | POA: Diagnosis not present

## 2012-02-29 DIAGNOSIS — M5137 Other intervertebral disc degeneration, lumbosacral region: Secondary | ICD-10-CM | POA: Diagnosis not present

## 2012-02-29 DIAGNOSIS — M999 Biomechanical lesion, unspecified: Secondary | ICD-10-CM | POA: Diagnosis not present

## 2012-02-29 DIAGNOSIS — M25559 Pain in unspecified hip: Secondary | ICD-10-CM | POA: Diagnosis not present

## 2012-03-04 DIAGNOSIS — M5137 Other intervertebral disc degeneration, lumbosacral region: Secondary | ICD-10-CM | POA: Diagnosis not present

## 2012-03-04 DIAGNOSIS — M999 Biomechanical lesion, unspecified: Secondary | ICD-10-CM | POA: Diagnosis not present

## 2012-03-04 DIAGNOSIS — M25559 Pain in unspecified hip: Secondary | ICD-10-CM | POA: Diagnosis not present

## 2012-03-05 DIAGNOSIS — M25559 Pain in unspecified hip: Secondary | ICD-10-CM | POA: Diagnosis not present

## 2012-03-05 DIAGNOSIS — M5137 Other intervertebral disc degeneration, lumbosacral region: Secondary | ICD-10-CM | POA: Diagnosis not present

## 2012-03-05 DIAGNOSIS — M999 Biomechanical lesion, unspecified: Secondary | ICD-10-CM | POA: Diagnosis not present

## 2012-03-11 DIAGNOSIS — R109 Unspecified abdominal pain: Secondary | ICD-10-CM | POA: Diagnosis not present

## 2012-03-12 DIAGNOSIS — M999 Biomechanical lesion, unspecified: Secondary | ICD-10-CM | POA: Diagnosis not present

## 2012-03-12 DIAGNOSIS — M5137 Other intervertebral disc degeneration, lumbosacral region: Secondary | ICD-10-CM | POA: Diagnosis not present

## 2012-03-12 DIAGNOSIS — M25559 Pain in unspecified hip: Secondary | ICD-10-CM | POA: Diagnosis not present

## 2012-03-20 ENCOUNTER — Other Ambulatory Visit: Payer: Self-pay | Admitting: Family Medicine

## 2012-03-20 ENCOUNTER — Ambulatory Visit
Admission: RE | Admit: 2012-03-20 | Discharge: 2012-03-20 | Disposition: A | Discharge status: Home / Self Care | Payer: Medicare Other | Source: Ambulatory Visit | Attending: Family Medicine | Admitting: Family Medicine

## 2012-03-20 DIAGNOSIS — K8020 Calculus of gallbladder without cholecystitis without obstruction: Secondary | ICD-10-CM | POA: Diagnosis not present

## 2012-03-20 DIAGNOSIS — R109 Unspecified abdominal pain: Secondary | ICD-10-CM

## 2012-03-20 MED ORDER — IOHEXOL 300 MG/ML  SOLN
80.0000 mL | Freq: Once | INTRAMUSCULAR | Status: AC | PRN
  Administered 2012-03-20: 80 mL via INTRAVENOUS

## 2012-04-05 ENCOUNTER — Ambulatory Visit (INDEPENDENT_AMBULATORY_CARE_PROVIDER_SITE_OTHER): Payer: Medicare Other | Admitting: *Deleted

## 2012-04-05 DIAGNOSIS — Z7901 Long term (current) use of anticoagulants: Secondary | ICD-10-CM | POA: Diagnosis not present

## 2012-04-05 DIAGNOSIS — I82409 Acute embolism and thrombosis of unspecified deep veins of unspecified lower extremity: Secondary | ICD-10-CM | POA: Diagnosis not present

## 2012-04-05 DIAGNOSIS — I2699 Other pulmonary embolism without acute cor pulmonale: Secondary | ICD-10-CM | POA: Diagnosis not present

## 2012-04-05 LAB — POCT INR: INR: 3.4

## 2012-04-15 DIAGNOSIS — I10 Essential (primary) hypertension: Secondary | ICD-10-CM | POA: Diagnosis not present

## 2012-04-15 DIAGNOSIS — E785 Hyperlipidemia, unspecified: Secondary | ICD-10-CM | POA: Diagnosis not present

## 2012-04-15 DIAGNOSIS — N183 Chronic kidney disease, stage 3 unspecified: Secondary | ICD-10-CM | POA: Diagnosis not present

## 2012-04-15 DIAGNOSIS — L989 Disorder of the skin and subcutaneous tissue, unspecified: Secondary | ICD-10-CM | POA: Diagnosis not present

## 2012-04-15 DIAGNOSIS — J309 Allergic rhinitis, unspecified: Secondary | ICD-10-CM | POA: Diagnosis not present

## 2012-04-29 ENCOUNTER — Ambulatory Visit (INDEPENDENT_AMBULATORY_CARE_PROVIDER_SITE_OTHER): Payer: Medicare Other | Admitting: *Deleted

## 2012-04-29 ENCOUNTER — Ambulatory Visit (INDEPENDENT_AMBULATORY_CARE_PROVIDER_SITE_OTHER): Payer: Medicare Other | Admitting: Cardiology

## 2012-04-29 ENCOUNTER — Encounter: Payer: Self-pay | Admitting: Cardiology

## 2012-04-29 VITALS — BP 130/76 | HR 63 | Ht 73.0 in | Wt 241.8 lb

## 2012-04-29 DIAGNOSIS — I2699 Other pulmonary embolism without acute cor pulmonale: Secondary | ICD-10-CM

## 2012-04-29 DIAGNOSIS — Z7901 Long term (current) use of anticoagulants: Secondary | ICD-10-CM | POA: Diagnosis not present

## 2012-04-29 DIAGNOSIS — I82409 Acute embolism and thrombosis of unspecified deep veins of unspecified lower extremity: Secondary | ICD-10-CM | POA: Diagnosis not present

## 2012-04-29 DIAGNOSIS — I2510 Atherosclerotic heart disease of native coronary artery without angina pectoris: Secondary | ICD-10-CM | POA: Diagnosis not present

## 2012-04-29 DIAGNOSIS — I10 Essential (primary) hypertension: Secondary | ICD-10-CM

## 2012-04-29 NOTE — Patient Instructions (Addendum)
The current medical regimen is effective;  continue present plan and medications.  Your physician has requested that you have an exercise tolerance test. For further information please visit [email or web address]. Please also follow instruction sheet, as given.   

## 2012-04-29 NOTE — Progress Notes (Signed)
HPI The patient presents for followup. He had stenting of his circumflex in July of last 2011.  He was also hospitalized in 2013 for dyspnea and found to have a pulmonary embolism.  During that admission he did have an echocardiogram which demonstrated some mildly reduced systolic right ventricular function. Since that time the patient has been doing very well. He denies any ongoing symptoms. He's been doing activities such as cutting down limbs in his yard.  With this he has had no cardiovascular symptoms. The patient denies any new symptoms such as chest discomfort, neck or arm discomfort. There has been no new shortness of breath, PND or orthopnea. There have been no reported palpitations, presyncope or syncope.  However, he's not been exercising as much as I would like. He wants to start doing an exercise regimen.   Allergies  Allergen Reactions  . Darifenacin Hydrobromide Er Nausea Only    Dizziness    Current Outpatient Prescriptions  Medication Sig Dispense Refill  . aspirin 81 MG tablet Take 81 mg by mouth daily.      . Cyanocobalamin (B-12) 1000 MCG CAPS 1 tab po qd       . Diphenhydramine-APAP, sleep, (EXCEDRIN PM PO) as needed.        Marland Kitchen esomeprazole (NEXIUM) 40 MG capsule Take 40 mg by mouth daily before breakfast.        . fenofibrate 160 MG tablet ONE DAILY  90 tablet  4  . Misc Natural Products (GLUCOSAMINE CHONDROITIN ADV PO) 1 tab po qd       . Multiple Vitamin (MULTIVITAMIN) capsule Take 1 capsule by mouth daily.        . nitroGLYCERIN (NITROSTAT) 0.4 MG SL tablet Place 1 tablet (0.4 mg total) under the tongue every 5 (five) minutes as needed.  25 tablet  8  . Omega-3 Fatty Acids (FISH OIL CONCENTRATE PO) Take 2 capsules by mouth daily.       . pravastatin (PRAVACHOL) 20 MG tablet TAKE 1 TABLET EVERY DAY  30 tablet  9  . warfarin (COUMADIN) 5 MG tablet Take 2.5 mg tonight 8/28, and 5 mg 8/29.  Follow up in coumadin clinic on 8/30  60 tablet  3   No current  facility-administered medications for this visit.    Past Medical History  Diagnosis Date  . HYPERTENSION   . CAD, NATIVE VESSEL     PCI and DES to circumflex  . DVT 2006    on coumadin x1 year, IVC filter placed  . Mixed hyperlipidemia   . PROSTATE CANCER   . GERD   . Prostate cancer     s/p mutliple urologic procedures  . MI (myocardial infarction)     Past Surgical History  Procedure Laterality Date  . Prostatectomy    . Knee surgery    . Hernia repair    . Vena cava filter placement    . Cardiac catheterization  2011    DES-mid LCx 08/2009; 30% pLAD, 25% m/dLAD, 75% ostial D1, 99% mLCx s/p DES, 25-30% p/mRCA, 40% mRCA, 30% mPDA stenoses; LVEF 50%, inf hypokinesis    ROS:  Urinary incontinence. Otherwise as stated in the HPI and negative for all other systems.  PHYSICAL EXAM BP 130/76  Pulse 63  Ht 6\' 1"  (1.854 m)  Wt 241 lb 12.8 oz (109.68 kg)  BMI 31.91 kg/m2 GENERAL:  Well appearing NECK:  No jugular venous distention, waveform within normal limits, carotid upstroke brisk and symmetric, no bruits, no  thyromegaly LYMPHATICS:  No cervical, inguinal adenopathy LUNGS:  Clear to auscultation bilaterally BACK:  No CVA tenderness CHEST:  Unremarkable HEART:  PMI not displaced or sustained,S1 and S2 within normal limits, no S3, no S4, no clicks, no rubs, soft apical systolic early peaking murmur, no diastolic murmurs ABD:  Flat, positive bowel sounds normal in frequency in pitch, no bruits, no rebound, no guarding, no midline pulsatile mass, no hepatomegaly, no splenomegaly, obese EXT:  2 plus pulses throughout, no edema, no cyanosis no clubbing SKIN:  No rashes no nodules   EKG:  Sinus rhythm, rate 63, first degree AV block, early transition in lead V2, ventricular ectopy, nonspecific T-wave flattening. 04/29/2012   ASSESSMENT AND PLAN  DVT I did discuss with the patient he will be on lifetime anticoagulation with bridging Lovenox as needed in the future.  CAD,  NATIVE VESSEL -  The patient has no new sypmtoms. It has been so long since his last evaluation I would like to bring the patient back for a POET (Plain Old Exercise Test). This will allow me to screen for obstructive coronary disease, risk stratify and very importantly provide a prescription for exercise.  HYPERTENSION -  The blood pressure is at target. No change in medications is indicated. We will continue with therapeutic lifestyle changes (TLC).   MIXED HYPERLIPIDEMIA -  This was recently checked by Johny Blamer, MD.  I will be happy to review these results.

## 2012-04-30 DIAGNOSIS — D235 Other benign neoplasm of skin of trunk: Secondary | ICD-10-CM | POA: Diagnosis not present

## 2012-04-30 DIAGNOSIS — C44319 Basal cell carcinoma of skin of other parts of face: Secondary | ICD-10-CM | POA: Diagnosis not present

## 2012-05-17 ENCOUNTER — Encounter: Payer: Medicare Other | Admitting: Physician Assistant

## 2012-05-18 ENCOUNTER — Other Ambulatory Visit: Payer: Self-pay | Admitting: Cardiology

## 2012-05-24 ENCOUNTER — Ambulatory Visit (INDEPENDENT_AMBULATORY_CARE_PROVIDER_SITE_OTHER): Payer: Medicare Other | Admitting: *Deleted

## 2012-05-24 DIAGNOSIS — I82409 Acute embolism and thrombosis of unspecified deep veins of unspecified lower extremity: Secondary | ICD-10-CM | POA: Diagnosis not present

## 2012-05-24 DIAGNOSIS — Z7901 Long term (current) use of anticoagulants: Secondary | ICD-10-CM | POA: Diagnosis not present

## 2012-05-24 DIAGNOSIS — I2699 Other pulmonary embolism without acute cor pulmonale: Secondary | ICD-10-CM

## 2012-05-24 LAB — POCT INR: INR: 2.6

## 2012-06-17 ENCOUNTER — Ambulatory Visit (INDEPENDENT_AMBULATORY_CARE_PROVIDER_SITE_OTHER): Payer: Medicare Other | Admitting: Pharmacist

## 2012-06-17 DIAGNOSIS — Z7901 Long term (current) use of anticoagulants: Secondary | ICD-10-CM | POA: Diagnosis not present

## 2012-06-17 DIAGNOSIS — I82409 Acute embolism and thrombosis of unspecified deep veins of unspecified lower extremity: Secondary | ICD-10-CM | POA: Diagnosis not present

## 2012-06-17 DIAGNOSIS — I2699 Other pulmonary embolism without acute cor pulmonale: Secondary | ICD-10-CM

## 2012-06-17 LAB — POCT INR: INR: 2.5

## 2012-06-19 ENCOUNTER — Encounter: Payer: Medicare Other | Admitting: Physician Assistant

## 2012-06-26 ENCOUNTER — Ambulatory Visit (INDEPENDENT_AMBULATORY_CARE_PROVIDER_SITE_OTHER): Payer: Medicare Other | Admitting: Physician Assistant

## 2012-06-26 DIAGNOSIS — I10 Essential (primary) hypertension: Secondary | ICD-10-CM

## 2012-06-26 DIAGNOSIS — I2510 Atherosclerotic heart disease of native coronary artery without angina pectoris: Secondary | ICD-10-CM

## 2012-06-26 NOTE — Progress Notes (Signed)
Exercise Treadmill Test  Pre-Exercise Testing Evaluation Rhythm: normal sinus  Rate: 68     Test  Exercise Tolerance Test Ordering MD: Olga Millers, MD  Interpreting MD: Tereso Newcomer, PA-C  Unique Test No: 1  Treadmill:  1  Indication for ETT: known ASHD  Contraindication to ETT: No   Stress Modality: exercise - treadmill  Cardiac Imaging Performed: non   Protocol: standard Bruce - maximal  Max BP:  186/84  Max MPHR (bpm):  141 85% PMHR(bpm):120  MPHR obtained (bpm):  131 % MPHR obtained:  93  Reached 85% MPHR (min:sec):  4:35 Total Exercise Time (min-sec):  6:30  Workload in METS:  7.7 Borg Scale: 17  Reason ETT Terminated:  fatigue    ST Segment Analysis At Rest: normal ST segments - no evidence of significant ST depression With Exercise: no evidence of significant ST depression  Other Information Arrhythmia:  one ventricular couplet noted during exercise Angina during ETT:  absent (0) Quality of ETT:  diagnostic  ETT Interpretation:  normal - no evidence of ischemia by ST analysis  Comments: Good exercise tolerance. No chest pain. Normal BP response to exercise. No ST-T changes to suggest ischemia.  HR recovery in 1st minute post exercise was fair (HR dropped from 130 to 120 at 2 mph on 2% grade).  Recommendations: F/u with Dr. Rollene Rotunda as directed Signed, Tereso Newcomer, PA-C  9:53 AM 06/26/2012

## 2012-07-26 ENCOUNTER — Ambulatory Visit (INDEPENDENT_AMBULATORY_CARE_PROVIDER_SITE_OTHER): Payer: Medicare Other | Admitting: *Deleted

## 2012-07-26 DIAGNOSIS — I2699 Other pulmonary embolism without acute cor pulmonale: Secondary | ICD-10-CM

## 2012-07-26 DIAGNOSIS — I82409 Acute embolism and thrombosis of unspecified deep veins of unspecified lower extremity: Secondary | ICD-10-CM | POA: Diagnosis not present

## 2012-07-26 DIAGNOSIS — Z7901 Long term (current) use of anticoagulants: Secondary | ICD-10-CM

## 2012-07-26 LAB — POCT INR: INR: 3.2

## 2012-08-08 ENCOUNTER — Other Ambulatory Visit: Payer: Self-pay | Admitting: Cardiology

## 2012-09-06 ENCOUNTER — Ambulatory Visit (INDEPENDENT_AMBULATORY_CARE_PROVIDER_SITE_OTHER): Payer: Medicare Other | Admitting: *Deleted

## 2012-09-06 DIAGNOSIS — Z7901 Long term (current) use of anticoagulants: Secondary | ICD-10-CM | POA: Diagnosis not present

## 2012-09-06 DIAGNOSIS — I2699 Other pulmonary embolism without acute cor pulmonale: Secondary | ICD-10-CM | POA: Diagnosis not present

## 2012-09-06 DIAGNOSIS — I82409 Acute embolism and thrombosis of unspecified deep veins of unspecified lower extremity: Secondary | ICD-10-CM | POA: Diagnosis not present

## 2012-10-06 DIAGNOSIS — L02419 Cutaneous abscess of limb, unspecified: Secondary | ICD-10-CM | POA: Diagnosis not present

## 2012-10-06 DIAGNOSIS — M25569 Pain in unspecified knee: Secondary | ICD-10-CM | POA: Diagnosis not present

## 2012-10-08 ENCOUNTER — Other Ambulatory Visit: Payer: Self-pay | Admitting: Orthopedic Surgery

## 2012-10-08 ENCOUNTER — Encounter (HOSPITAL_COMMUNITY): Payer: Self-pay | Admitting: *Deleted

## 2012-10-08 DIAGNOSIS — M25469 Effusion, unspecified knee: Secondary | ICD-10-CM | POA: Diagnosis not present

## 2012-10-08 NOTE — H&P (Signed)
[name] is an 79 y.o. male.   Chief Complaint: Left knee pain and swelling HPI: Mr Luckenbach was doing fine with no problems related to his knees until approximately 4 days ago. He was working outside in the heat and came inside to take a nap. When he woke up his knee was warm and swollen with inability to bear weight. His knee was asymptomatic prior to the acute onset of this pain and swelling. He denies any fever or chills. He went to urgent care the next day and was felt to have a possible infection. He was started on oral antibiotics and has felt a little better. He called our office this AM and was told to come in immediately  Past Medical History  Diagnosis Date  . CAD, NATIVE VESSEL     PCI and DES to circumflex  . DVT 2006    on coumadin x1 year, IVC filter placed  . Mixed hyperlipidemia   . PROSTATE CANCER   . GERD   . Prostate cancer     s/p mutliple urologic procedures  . Pulmonary embolus 2013    LIFETIME ANTICOAGULATION BECAUSE OF PE AND PAST HX OF DVT  . MI (myocardial infarction) 08/25/09  . HYPERTENSION     PT DENIES HAVING HYPERTENSION  . Urinary incontinence     MULTIPLE BLADDER SURGERIES - STATES NO URINARY SPHINCTER - PT'S UROLOGIST IS AT [facility]- DR. PETERSON  ( LAST VISIT WAS 09/15/11 )    Past Surgical History  Procedure Laterality Date  . Prostatectomy  04/25/2004  . Knee surgery      BILATERAL TOTAL KNEE REPLACEMENTS  2007 RT AND 2009 LEFT  . Hernia repair    . Vena cava filter placement    . Cardiac catheterization  2011    DES-mid LCx 08/2009; 30% pLAD, 25% m/dLAD, 75% ostial D1, 99% mLCx s/p DES, 25-30% p/mRCA, 40% mRCA, 30% mPDA stenoses; LVEF 50%, inf hypokinesis  . Coronary angioplasty  08/25/09    STENT PLACEMENT    History reviewed. No pertinent family history. Social History:  reports that he quit smoking about 51 years ago. He does not have any smokeless tobacco history on file. He reports that he does not drink alcohol or use illicit  drugs.  Allergies:  Allergies  Allergen Reactions  . Darifenacin Hydrobromide Er Nausea Only    Dizziness    No prescriptions prior to admission    No results found for this or any previous visit (from the past 48 hour(s)). No results found.  Review of Systems  Constitutional: Positive for fever and chills. Negative for weight loss, malaise/fatigue and diaphoresis.  HENT: Negative.   Eyes: Negative.   Respiratory: Negative.   Cardiovascular: Negative.   Gastrointestinal: Negative.   Genitourinary: Negative.   Musculoskeletal: Positive for myalgias and joint pain.  Skin: Negative.   Neurological: Positive for weakness.  Endo/Heme/Allergies: Negative.   Psychiatric/Behavioral: Negative.     There were no vitals taken for this visit. Physical Exam  Physical Examination: General appearance - alert, well appearing, and in no distress Mental status - alert, oriented to person, place, and time Neck - supple, no significant adenopathy Chest - clear to auscultation, no wheezes, rales or rhonchi, symmetric air entry Heart - normal rate, regular rhythm, normal S1, S2, no murmurs, rubs, clicks or gallops Abdomen - soft, nontender, nondistended, no masses or organomegaly Left knee with moderate effusion; no significant warmth; ROM 10-95; non tender; no instability   Assessment/Plan Left knee  acute septic arthritis- Aspiration was performed in office on 9/2 revealing purulent fluid. Discussed treatment options with patient who elects to proceed with arthrotomy, I & D and polyethylene revision. Discussed procedure, risks and potential complications with patient, including possibility that this may not work and necessitate a resection arthroplasty. He appears to understand and elects to proceed.  Loanne Drilling 10/08/2012, 11:26 PM

## 2012-10-08 NOTE — Progress Notes (Signed)
ADD ON FOR SURGERY TOMORROW.  [facility] SURGERY PLANNED - PREOP INSTRUCTIONS AND CHLORHEXIDINE INSTRUCTIONS / PRECAUTIONS REVIEWED WITH PT BY PHONE.  PT STATES DR'S OFFICE TOLD HIM HIS SURGERY TOMORROW IS AT 2 PM.  I TOLD PT HIS SURGERY IS SCHEDULED FOR 7 PM.  I HAVE PUT A NOTE ON FRONT OF PT'S CHART FOR NURSES TO CHECK WITH DR. Lequita Halt TOMORROW AND CALL PT BACK IN AM.

## 2012-10-09 ENCOUNTER — Ambulatory Visit (HOSPITAL_COMMUNITY): Payer: Medicare Other | Admitting: Anesthesiology

## 2012-10-09 ENCOUNTER — Encounter (HOSPITAL_COMMUNITY): Payer: Self-pay | Admitting: *Deleted

## 2012-10-09 ENCOUNTER — Ambulatory Visit (HOSPITAL_COMMUNITY): Payer: Medicare Other

## 2012-10-09 ENCOUNTER — Encounter (HOSPITAL_COMMUNITY): Payer: Self-pay | Admitting: Anesthesiology

## 2012-10-09 ENCOUNTER — Encounter (HOSPITAL_COMMUNITY)
Admission: RE | Disposition: A | Discharge status: Home Health | Payer: Self-pay | Source: Ambulatory Visit | Attending: Orthopedic Surgery

## 2012-10-09 ENCOUNTER — Inpatient Hospital Stay (HOSPITAL_COMMUNITY)
Admission: RE | Admit: 2012-10-09 | Discharge: 2012-10-11 | DRG: 486 | Disposition: A | Discharge status: Home Health | Payer: Medicare Other | Source: Ambulatory Visit | Attending: Orthopedic Surgery | Admitting: Orthopedic Surgery

## 2012-10-09 DIAGNOSIS — E663 Overweight: Secondary | ICD-10-CM | POA: Diagnosis present

## 2012-10-09 DIAGNOSIS — T8454XA Infection and inflammatory reaction due to internal left knee prosthesis, initial encounter: Secondary | ICD-10-CM

## 2012-10-09 DIAGNOSIS — I252 Old myocardial infarction: Secondary | ICD-10-CM | POA: Diagnosis not present

## 2012-10-09 DIAGNOSIS — Z8546 Personal history of malignant neoplasm of prostate: Secondary | ICD-10-CM | POA: Diagnosis present

## 2012-10-09 DIAGNOSIS — T8450XA Infection and inflammatory reaction due to unspecified internal joint prosthesis, initial encounter: Secondary | ICD-10-CM | POA: Diagnosis not present

## 2012-10-09 DIAGNOSIS — Z86718 Personal history of other venous thrombosis and embolism: Secondary | ICD-10-CM | POA: Diagnosis present

## 2012-10-09 DIAGNOSIS — Z7901 Long term (current) use of anticoagulants: Secondary | ICD-10-CM

## 2012-10-09 DIAGNOSIS — I2782 Chronic pulmonary embolism: Secondary | ICD-10-CM | POA: Diagnosis present

## 2012-10-09 DIAGNOSIS — C61 Malignant neoplasm of prostate: Secondary | ICD-10-CM | POA: Diagnosis present

## 2012-10-09 DIAGNOSIS — K219 Gastro-esophageal reflux disease without esophagitis: Secondary | ICD-10-CM | POA: Diagnosis present

## 2012-10-09 DIAGNOSIS — Z96659 Presence of unspecified artificial knee joint: Secondary | ICD-10-CM

## 2012-10-09 DIAGNOSIS — Z6832 Body mass index (BMI) 32.0-32.9, adult: Secondary | ICD-10-CM

## 2012-10-09 DIAGNOSIS — M009 Pyogenic arthritis, unspecified: Secondary | ICD-10-CM | POA: Diagnosis not present

## 2012-10-09 DIAGNOSIS — I10 Essential (primary) hypertension: Secondary | ICD-10-CM | POA: Diagnosis present

## 2012-10-09 DIAGNOSIS — I82509 Chronic embolism and thrombosis of unspecified deep veins of unspecified lower extremity: Secondary | ICD-10-CM | POA: Diagnosis present

## 2012-10-09 DIAGNOSIS — I119 Hypertensive heart disease without heart failure: Secondary | ICD-10-CM | POA: Diagnosis present

## 2012-10-09 DIAGNOSIS — Y831 Surgical operation with implant of artificial internal device as the cause of abnormal reaction of the patient, or of later complication, without mention of misadventure at the time of the procedure: Secondary | ICD-10-CM | POA: Diagnosis present

## 2012-10-09 DIAGNOSIS — Z01818 Encounter for other preprocedural examination: Secondary | ICD-10-CM | POA: Diagnosis not present

## 2012-10-09 DIAGNOSIS — I2510 Atherosclerotic heart disease of native coronary artery without angina pectoris: Secondary | ICD-10-CM | POA: Diagnosis present

## 2012-10-09 DIAGNOSIS — I2699 Other pulmonary embolism without acute cor pulmonale: Secondary | ICD-10-CM | POA: Diagnosis present

## 2012-10-09 DIAGNOSIS — E782 Mixed hyperlipidemia: Secondary | ICD-10-CM | POA: Diagnosis present

## 2012-10-09 DIAGNOSIS — M25569 Pain in unspecified knee: Secondary | ICD-10-CM | POA: Diagnosis not present

## 2012-10-09 DIAGNOSIS — Z471 Aftercare following joint replacement surgery: Secondary | ICD-10-CM | POA: Diagnosis not present

## 2012-10-09 HISTORY — PX: I & D KNEE WITH POLY EXCHANGE: SHX5024

## 2012-10-09 HISTORY — DX: Unspecified urinary incontinence: R32

## 2012-10-09 HISTORY — PX: KNEE ARTHROTOMY: SHX5881

## 2012-10-09 LAB — COMPREHENSIVE METABOLIC PANEL
ALT: 8 U/L (ref 0–53)
AST: 15 U/L (ref 0–37)
Albumin: 2.8 g/dL — ABNORMAL LOW (ref 3.5–5.2)
CO2: 22 mEq/L (ref 19–32)
Calcium: 9.3 mg/dL (ref 8.4–10.5)
Chloride: 106 mEq/L (ref 96–112)
Creatinine, Ser: 1.4 mg/dL — ABNORMAL HIGH (ref 0.50–1.35)
GFR calc non Af Amer: 46 mL/min — ABNORMAL LOW (ref >90)
Sodium: 137 mEq/L (ref 135–145)
Total Bilirubin: 0.9 mg/dL (ref 0.3–1.2)

## 2012-10-09 LAB — CBC
MCV: 82.3 fL (ref 78.0–100.0)
Platelets: 219 10*3/uL (ref 150–400)
RBC: 4.91 MIL/uL (ref 4.22–5.81)
RDW: 14.9 % (ref 11.5–15.5)
WBC: 7.7 10*3/uL (ref 4.0–10.5)

## 2012-10-09 LAB — PROTIME-INR: INR: 1.41 (ref 0.00–1.49)

## 2012-10-09 SURGERY — ARTHROTOMY, KNEE
Anesthesia: General | Site: Knee | Laterality: Left | Wound class: Dirty or Infected

## 2012-10-09 MED ORDER — ACETAMINOPHEN 650 MG RE SUPP: 650.0000 mg | Freq: Four times a day (QID) | RECTAL | Status: DC | PRN

## 2012-10-09 MED ORDER — HYDROMORPHONE HCL PF 1 MG/ML IJ SOLN
INTRAMUSCULAR | Status: DC | PRN
  Administered 2012-10-09 (×2): 1 mg via INTRAVENOUS

## 2012-10-09 MED ORDER — ONDANSETRON HCL 4 MG PO TABS: 4.0000 mg | ORAL_TABLET | Freq: Four times a day (QID) | ORAL | Status: DC | PRN

## 2012-10-09 MED ORDER — FENTANYL CITRATE 0.05 MG/ML IJ SOLN
INTRAMUSCULAR | Status: DC | PRN
  Administered 2012-10-09 (×5): 50 ug via INTRAVENOUS

## 2012-10-09 MED ORDER — DEXAMETHASONE 6 MG PO TABS
10.0000 mg | ORAL_TABLET | Freq: Three times a day (TID) | ORAL | Status: AC
  Administered 2012-10-10: 10 mg via ORAL
  Filled 2012-10-09: qty 1

## 2012-10-09 MED ORDER — NITROGLYCERIN 0.4 MG SL SUBL: 0.4000 mg | SUBLINGUAL_TABLET | SUBLINGUAL | Status: DC | PRN

## 2012-10-09 MED ORDER — ACETAMINOPHEN 10 MG/ML IV SOLN
1000.0000 mg | Freq: Once | INTRAVENOUS | Status: DC
  Filled 2012-10-09: qty 100

## 2012-10-09 MED ORDER — MENTHOL 3 MG MT LOZG: 1.0000 | LOZENGE | OROMUCOSAL | Status: DC | PRN

## 2012-10-09 MED ORDER — CEFAZOLIN SODIUM-DEXTROSE 2-3 GM-% IV SOLR
2.0000 g | Freq: Four times a day (QID) | INTRAVENOUS | Status: DC
  Administered 2012-10-10 (×3): 2 g via INTRAVENOUS
  Filled 2012-10-09 (×5): qty 50

## 2012-10-09 MED ORDER — DEXTROSE 5 % IV SOLN: 3.0000 g | INTRAVENOUS | Status: DC

## 2012-10-09 MED ORDER — ACETAMINOPHEN 325 MG PO TABS: 650.0000 mg | ORAL_TABLET | Freq: Four times a day (QID) | ORAL | Status: DC | PRN

## 2012-10-09 MED ORDER — LACTATED RINGERS IV SOLN
INTRAVENOUS | Status: DC | PRN
  Administered 2012-10-09: 19:00:00 via INTRAVENOUS

## 2012-10-09 MED ORDER — OXYCODONE HCL 5 MG PO TABS
5.0000 mg | ORAL_TABLET | ORAL | Status: DC | PRN
  Administered 2012-10-10 (×4): 10 mg via ORAL
  Administered 2012-10-11: 5 mg via ORAL
  Administered 2012-10-11: 10 mg via ORAL
  Filled 2012-10-09 (×2): qty 2
  Filled 2012-10-09: qty 1
  Filled 2012-10-09 (×3): qty 2

## 2012-10-09 MED ORDER — ASPIRIN 81 MG PO TABS: 81.0000 mg | ORAL_TABLET | Freq: Every day | ORAL | Status: DC

## 2012-10-09 MED ORDER — METOCLOPRAMIDE HCL 5 MG/ML IJ SOLN: 5.0000 mg | Freq: Three times a day (TID) | INTRAMUSCULAR | Status: DC | PRN

## 2012-10-09 MED ORDER — ASPIRIN EC 81 MG PO TBEC
81.0000 mg | DELAYED_RELEASE_TABLET | Freq: Every day | ORAL | Status: DC
  Administered 2012-10-10 – 2012-10-11 (×2): 81 mg via ORAL
  Filled 2012-10-09 (×2): qty 1

## 2012-10-09 MED ORDER — MIDAZOLAM HCL 5 MG/5ML IJ SOLN
INTRAMUSCULAR | Status: DC | PRN
  Administered 2012-10-09: 1 mg via INTRAVENOUS

## 2012-10-09 MED ORDER — BISACODYL 10 MG RE SUPP: 10.0000 mg | Freq: Every day | RECTAL | Status: DC | PRN

## 2012-10-09 MED ORDER — POLYETHYLENE GLYCOL 3350 17 G PO PACK: 17.0000 g | PACK | Freq: Every day | ORAL | Status: DC | PRN

## 2012-10-09 MED ORDER — LIDOCAINE HCL (CARDIAC) 20 MG/ML IV SOLN
INTRAVENOUS | Status: DC | PRN
  Administered 2012-10-09: 75 mg via INTRAVENOUS

## 2012-10-09 MED ORDER — CHLORHEXIDINE GLUCONATE 4 % EX LIQD: 60.0000 mL | Freq: Once | CUTANEOUS | Status: DC

## 2012-10-09 MED ORDER — FLEET ENEMA 7-19 GM/118ML RE ENEM: 1.0000 | ENEMA | Freq: Once | RECTAL | Status: AC | PRN

## 2012-10-09 MED ORDER — PHENOL 1.4 % MT LIQD: 1.0000 | OROMUCOSAL | Status: DC | PRN

## 2012-10-09 MED ORDER — DIPHENHYDRAMINE HCL 12.5 MG/5ML PO ELIX
12.5000 mg | ORAL_SOLUTION | ORAL | Status: DC | PRN
  Filled 2012-10-09: qty 10

## 2012-10-09 MED ORDER — MORPHINE SULFATE 2 MG/ML IJ SOLN: 1.0000 mg | INTRAMUSCULAR | Status: DC | PRN

## 2012-10-09 MED ORDER — ONDANSETRON HCL 4 MG/2ML IJ SOLN
INTRAMUSCULAR | Status: DC | PRN
  Administered 2012-10-09 (×2): 2 mg via INTRAVENOUS

## 2012-10-09 MED ORDER — DEXAMETHASONE SODIUM PHOSPHATE 10 MG/ML IJ SOLN: 10.0000 mg | Freq: Once | INTRAMUSCULAR | Status: DC

## 2012-10-09 MED ORDER — ONDANSETRON HCL 4 MG/2ML IJ SOLN: 4.0000 mg | Freq: Four times a day (QID) | INTRAMUSCULAR | Status: DC | PRN

## 2012-10-09 MED ORDER — METOCLOPRAMIDE HCL 10 MG PO TABS: 5.0000 mg | ORAL_TABLET | Freq: Three times a day (TID) | ORAL | Status: DC | PRN

## 2012-10-09 MED ORDER — WARFARIN - PHARMACIST DOSING INPATIENT: Freq: Every day | Status: DC

## 2012-10-09 MED ORDER — WARFARIN SODIUM 5 MG PO TABS
5.0000 mg | ORAL_TABLET | Freq: Once | ORAL | Status: AC
  Administered 2012-10-10: 5 mg via ORAL
  Filled 2012-10-09: qty 1

## 2012-10-09 MED ORDER — DEXTROSE 5 % IV SOLN
3.0000 g | INTRAVENOUS | Status: AC
  Administered 2012-10-09: 2 g via INTRAVENOUS

## 2012-10-09 MED ORDER — PROMETHAZINE HCL 25 MG/ML IJ SOLN: 6.2500 mg | INTRAMUSCULAR | Status: DC | PRN

## 2012-10-09 MED ORDER — ACETAMINOPHEN 10 MG/ML IV SOLN
INTRAVENOUS | Status: DC | PRN
  Administered 2012-10-09: 1000 mg via INTRAVENOUS

## 2012-10-09 MED ORDER — PHENYLEPHRINE HCL 10 MG/ML IJ SOLN
INTRAMUSCULAR | Status: DC | PRN
  Administered 2012-10-09 (×2): 20 ug via INTRAVENOUS

## 2012-10-09 MED ORDER — DOCUSATE SODIUM 100 MG PO CAPS
100.0000 mg | ORAL_CAPSULE | Freq: Two times a day (BID) | ORAL | Status: DC
  Administered 2012-10-10 – 2012-10-11 (×4): 100 mg via ORAL

## 2012-10-09 MED ORDER — METHOCARBAMOL 500 MG PO TABS
500.0000 mg | ORAL_TABLET | Freq: Four times a day (QID) | ORAL | Status: DC | PRN
  Administered 2012-10-10 (×2): 500 mg via ORAL
  Filled 2012-10-09 (×3): qty 1

## 2012-10-09 MED ORDER — ENOXAPARIN SODIUM 30 MG/0.3ML ~~LOC~~ SOLN
30.0000 mg | Freq: Two times a day (BID) | SUBCUTANEOUS | Status: DC
  Administered 2012-10-10 – 2012-10-11 (×3): 30 mg via SUBCUTANEOUS
  Filled 2012-10-09 (×6): qty 0.3

## 2012-10-09 MED ORDER — DEXAMETHASONE SODIUM PHOSPHATE 10 MG/ML IJ SOLN
10.0000 mg | Freq: Three times a day (TID) | INTRAMUSCULAR | Status: AC
  Filled 2012-10-09: qty 1

## 2012-10-09 MED ORDER — DEXTROSE 5 % IV SOLN
500.0000 mg | Freq: Four times a day (QID) | INTRAVENOUS | Status: DC | PRN
  Filled 2012-10-09: qty 5

## 2012-10-09 MED ORDER — PROPOFOL 10 MG/ML IV BOLUS
INTRAVENOUS | Status: DC | PRN
  Administered 2012-10-09: 25 mg via INTRAVENOUS
  Administered 2012-10-09: 175 mg via INTRAVENOUS

## 2012-10-09 MED ORDER — SODIUM CHLORIDE 0.9 % IV SOLN
INTRAVENOUS | Status: DC
  Administered 2012-10-09: 19:00:00 via INTRAVENOUS

## 2012-10-09 MED ORDER — FENOFIBRATE 160 MG PO TABS
160.0000 mg | ORAL_TABLET | Freq: Every day | ORAL | Status: DC
  Administered 2012-10-10 – 2012-10-11 (×2): 160 mg via ORAL
  Filled 2012-10-09 (×2): qty 1

## 2012-10-09 MED ORDER — SODIUM CHLORIDE 0.9 % IV SOLN
INTRAVENOUS | Status: DC
  Administered 2012-10-09: 17:00:00 via INTRAVENOUS

## 2012-10-09 MED ORDER — LACTATED RINGERS IV SOLN
INTRAVENOUS | Status: DC
Start: 2012-10-09 — End: 2012-10-09
  Administered 2012-10-09: 1000 mL via INTRAVENOUS

## 2012-10-09 MED ORDER — TRAMADOL HCL 50 MG PO TABS: 50.0000 mg | ORAL_TABLET | Freq: Four times a day (QID) | ORAL | Status: DC | PRN

## 2012-10-09 MED ORDER — SODIUM CHLORIDE 0.9 % IR SOLN
Status: DC | PRN
  Administered 2012-10-09: 1000 mL

## 2012-10-09 MED ORDER — PANTOPRAZOLE SODIUM 40 MG PO TBEC
80.0000 mg | DELAYED_RELEASE_TABLET | Freq: Every day | ORAL | Status: DC
  Administered 2012-10-10 – 2012-10-11 (×2): 80 mg via ORAL
  Filled 2012-10-09 (×2): qty 2

## 2012-10-09 MED ORDER — HYDROMORPHONE HCL PF 1 MG/ML IJ SOLN: 0.2500 mg | INTRAMUSCULAR | Status: DC | PRN

## 2012-10-09 MED ORDER — EPHEDRINE SULFATE 50 MG/ML IJ SOLN
INTRAMUSCULAR | Status: DC | PRN
  Administered 2012-10-09: 5 mg via INTRAVENOUS

## 2012-10-09 MED ORDER — SODIUM CHLORIDE 0.9 % IR SOLN
Status: DC | PRN
  Administered 2012-10-09 (×2): 3000 mL

## 2012-10-09 SURGICAL SUPPLY — 55 items
BAG SPEC THK2 15X12 ZIP CLS (MISCELLANEOUS) ×1
BAG ZIPLOCK 12X15 (MISCELLANEOUS) ×2 IMPLANT
BANDAGE ELASTIC 6 VELCRO ST LF (GAUZE/BANDAGES/DRESSINGS) ×2 IMPLANT
BANDAGE ESMARK 6X9 LF (GAUZE/BANDAGES/DRESSINGS) ×1 IMPLANT
BNDG CMPR 9X6 STRL LF SNTH (GAUZE/BANDAGES/DRESSINGS) ×1
BNDG ESMARK 6X9 LF (GAUZE/BANDAGES/DRESSINGS) ×2
CLOSURE STERI-STRIP 1/4X4 (GAUZE/BANDAGES/DRESSINGS) ×2 IMPLANT
CLOTH BEACON ORANGE TIMEOUT ST (SAFETY) ×2 IMPLANT
CONT SPECI 4OZ STER CLIK (MISCELLANEOUS) ×2 IMPLANT
CUFF TOURN SGL QUICK 34 (TOURNIQUET CUFF) ×2
CUFF TRNQT CYL 34X4X40X1 (TOURNIQUET CUFF) ×1 IMPLANT
DRAPE EXTREMITY T 121X128X90 (DRAPE) ×2 IMPLANT
DRAPE POUCH INSTRU U-SHP 10X18 (DRAPES) ×2 IMPLANT
DRAPE U-SHAPE 47X51 STRL (DRAPES) ×2 IMPLANT
DRSG ADAPTIC 3X8 NADH LF (GAUZE/BANDAGES/DRESSINGS) ×2 IMPLANT
DRSG PAD ABDOMINAL 8X10 ST (GAUZE/BANDAGES/DRESSINGS) ×4 IMPLANT
DURAPREP 26ML APPLICATOR (WOUND CARE) ×2 IMPLANT
ELECT REM PT RETURN 9FT ADLT (ELECTROSURGICAL) ×2
ELECTRODE REM PT RTRN 9FT ADLT (ELECTROSURGICAL) ×1 IMPLANT
FACESHIELD LNG OPTICON STERILE (SAFETY) ×10 IMPLANT
GLOVE BIO SURGEON STRL SZ7.5 (GLOVE) ×2 IMPLANT
GLOVE BIO SURGEON STRL SZ8 (GLOVE) ×2 IMPLANT
GLOVE BIOGEL PI IND STRL 8 (GLOVE) ×3 IMPLANT
GLOVE BIOGEL PI INDICATOR 8 (GLOVE) ×3
GOWN STRL NON-REIN LRG LVL3 (GOWN DISPOSABLE) ×2 IMPLANT
GOWN STRL REIN XL XLG (GOWN DISPOSABLE) ×2 IMPLANT
HANDPIECE INTERPULSE COAX TIP (DISPOSABLE) ×2
IMMOBILIZER KNEE 20 (SOFTGOODS) ×2
IMMOBILIZER KNEE 20 THIGH 36 (SOFTGOODS) ×1 IMPLANT
KIT BASIN OR (CUSTOM PROCEDURE TRAY) ×2 IMPLANT
KIT STIMULAN RAPID CURE 5CC (Orthopedic Implant) ×1 IMPLANT
MANIFOLD NEPTUNE II (INSTRUMENTS) ×2 IMPLANT
NS IRRIG 1000ML POUR BTL (IV SOLUTION) ×2 IMPLANT
PACK TOTAL JOINT (CUSTOM PROCEDURE TRAY) ×2 IMPLANT
PAD ABD 7.5X8 STRL (GAUZE/BANDAGES/DRESSINGS) ×3 IMPLANT
PADDING CAST COTTON 6X4 STRL (CAST SUPPLIES) ×4 IMPLANT
PLATE ROT INSERT 12.5MM (Plate) ×1 IMPLANT
POSITIONER SURGICAL ARM (MISCELLANEOUS) ×2 IMPLANT
SET HNDPC FAN SPRY TIP SCT (DISPOSABLE) ×1 IMPLANT
SPONGE GAUZE 4X4 12PLY (GAUZE/BANDAGES/DRESSINGS) ×2 IMPLANT
SPONGE LAP 18X18 X RAY DECT (DISPOSABLE) ×2 IMPLANT
STAPLER VISISTAT 35W (STAPLE) ×2 IMPLANT
STRIP CLOSURE SKIN 1/2X4 (GAUZE/BANDAGES/DRESSINGS) ×4 IMPLANT
SUCTION FRAZIER 12FR DISP (SUCTIONS) ×2 IMPLANT
SUT MNCRL AB 4-0 PS2 18 (SUTURE) ×3 IMPLANT
SUT PDS AB 1 CT1 27 (SUTURE) ×6 IMPLANT
SUT VIC AB 2-0 CT1 27 (SUTURE) ×6
SUT VIC AB 2-0 CT1 TAPERPNT 27 (SUTURE) ×3 IMPLANT
SUT VLOC 180 0 24IN GS25 (SUTURE) ×2 IMPLANT
SWAB COLLECTION DEVICE MRSA (MISCELLANEOUS) ×2 IMPLANT
TOWEL OR 17X26 10 PK STRL BLUE (TOWEL DISPOSABLE) ×4 IMPLANT
TRAY FOLEY CATH 14FRSI W/METER (CATHETERS) ×2 IMPLANT
TUBE ANAEROBIC SPECIMEN COL (MISCELLANEOUS) ×2 IMPLANT
WATER STERILE IRR 1500ML POUR (IV SOLUTION) ×2 IMPLANT
WRAP KNEE MAXI GEL POST OP (GAUZE/BANDAGES/DRESSINGS) ×4 IMPLANT

## 2012-10-09 NOTE — Anesthesia Postprocedure Evaluation (Signed)
  Anesthesia Post-op Note  Patient: [name]  Procedure(s) Performed: Procedure(s) (LRB): LEFT KNEE ARTHROTOMY (Left) IRRIGATION AND DEBRIDEMENT LEFT KNEE WITH POLY REVISION (Left)  Patient Location: PACU  Anesthesia Type: General  Level of Consciousness: awake and alert   Airway and Oxygen Therapy: Patient Spontanous Breathing  Post-op Pain: mild  Post-op Assessment: Post-op Vital signs reviewed, Patient's Cardiovascular Status Stable, Respiratory Function Stable, Patent Airway and No signs of Nausea or vomiting  Last Vitals:  Filed Vitals:   10/09/12 1930  BP: 142/68  Pulse: 78  Temp: 36.8 C  Resp: 15    Post-op Vital Signs: stable   Complications: No apparent anesthesia complications

## 2012-10-09 NOTE — Transfer of Care (Signed)
Immediate Anesthesia Transfer of Care Note  Patient: [name]  Procedure(s) Performed: Procedure(s): LEFT KNEE ARTHROTOMY (Left) IRRIGATION AND DEBRIDEMENT LEFT KNEE WITH POLY REVISION (Left)  Patient Location: PACU  Anesthesia Type:General  Level of Consciousness: awake, alert , oriented and patient cooperative  Airway & Oxygen Therapy: Patient Spontanous Breathing and Patient connected to face mask oxygen  Post-op Assessment: Report given to PACU RN, Post -op Vital signs reviewed and stable and Patient moving all extremities  Post vital signs: Reviewed and stable  Complications: No apparent anesthesia complications

## 2012-10-09 NOTE — Progress Notes (Signed)
ANTICOAGULATION CONSULT NOTE - Initial Consult  Pharmacy Consult for warfarin Indication: VTE prophylaxis  Allergies  Allergen Reactions  . Darifenacin Hydrobromide Er Nausea Only    Dizziness    Patient Measurements: Height: 6' (182.9 cm) Weight: 240 lb (108.863 kg) IBW/kg (Calculated) : 77.6 Heparin Dosing Weight:   Vital Signs: Temp: 98.3 F (36.8 C) (09/03 1951) BP: 157/89 mmHg (09/03 1951) Pulse Rate: 73 (09/03 1951)  Labs:  Recent Labs  10/09/12 1520  HGB 14.0  HCT 40.4  PLT 219  APTT 32  LABPROT 16.9*  INR 1.41  CREATININE 1.40*    Estimated Creatinine Clearance: 54.5 ml/min (by C-G formula based on Cr of 1.4).   Medical History: Past Medical History  Diagnosis Date  . CAD, NATIVE VESSEL     PCI and DES to circumflex  . DVT 2006    on coumadin x1 year, IVC filter placed  . Mixed hyperlipidemia   . PROSTATE CANCER   . GERD   . Prostate cancer     s/p mutliple urologic procedures  . Pulmonary embolus 2013    LIFETIME ANTICOAGULATION BECAUSE OF PE AND PAST HX OF DVT  . MI (myocardial infarction) 08/25/09  . HYPERTENSION     PT DENIES HAVING HYPERTENSION  . Urinary incontinence     MULTIPLE BLADDER SURGERIES - STATES NO URINARY SPHINCTER - PT'S UROLOGIST IS AT [facility]- DR. PETERSON  ( LAST VISIT WAS 09/15/11 )    Assessment: 81 YOM s/p L knee arthrotomy for septic arthritis of knee. He was taking warfarin prior to holding for surgery for h/o DVT. He does have IVC filter.  Home warfarin dosing: per coumadin clinic visit 5mg  on TuSa and 2.5 mg on all other days  Todays INR = 1.41  Goal of Therapy:  INR 2-3 Monitor platelets by anticoagulation protocol: Yes   Plan:   Start with warfarin 5mg  tonight  Daily PT/INR  Monitor for bleeding  D/c LMWH when INR >= 1.8  Juliette Alcide, PharmD, BCPS.   Pager: [phone]  10/09/2012,8:53 PM

## 2012-10-09 NOTE — Interval H&P Note (Signed)
History and Physical Interval Note:  10/09/2012 4:50 PM  [name]  has presented today for surgery, with the diagnosis of SEPTIC ARTHRITIS LEFT KNEE  The various methods of treatment have been discussed with the patient and family. After consideration of risks, benefits and other options for treatment, the patient has consented to  Procedure(s): LEFT KNEE ARTHROTOMY (Left) IRRIGATION AND DEBRIDEMENT LEFT KNEE WITH POLY REVISION (Left) as a surgical intervention .  The patient's history has been reviewed, patient examined, no change in status, stable for surgery.  I have reviewed the patient's chart and labs.  Questions were answered to the patient's satisfaction.     Loanne Drilling

## 2012-10-09 NOTE — Brief Op Note (Signed)
10/09/2012  7:05 PM  PATIENT:  Jamespaul P Spina  79 y.o. male  PRE-OPERATIVE DIAGNOSIS:  SEPTIC ARTHRITIS LEFT KNEE  POST-OPERATIVE DIAGNOSIS:  SEPTIC ARTHRITIS LEFT KNEE  PROCEDURE:  Procedure(s): LEFT KNEE ARTHROTOMY (Left) IRRIGATION AND DEBRIDEMENT LEFT KNEE WITH POLY REVISION (Left)  SURGEON:  Surgeon(s) and Role:    * Loanne Drilling, MD - Primary  PHYSICIAN ASSISTANT:   ASSISTANTS: none   ANESTHESIA:   general  EBL:     DRAINS: (Medium) Hemovact drain(s) in the left knee with  Suction Open   LOCAL MEDICATIONS USED:  NONE  COUNTS:  YES  TOURNIQUET:   Total Tourniquet Time Documented: Thigh (Left) - 39 minutes Total: Thigh (Left) - 39 minutes   DICTATION: .Other Dictation: Dictation Number [id number]  PLAN OF CARE: Admit to inpatient   PATIENT DISPOSITION:  PACU - hemodynamically stable.

## 2012-10-09 NOTE — Anesthesia Procedure Notes (Signed)
Procedure Name: LMA Insertion Date/Time: 10/09/2012 5:29 PM Performed by: Edison Pace Pre-anesthesia Checklist: Patient identified, Timeout performed, Emergency Drugs available, Suction available and Patient being monitored Patient Re-evaluated:Patient Re-evaluated prior to inductionOxygen Delivery Method: Circle system utilized Preoxygenation: Pre-oxygenation with 100% oxygen Intubation Type: IV induction LMA: LMA inserted LMA Size: 4.0 Number of attempts: 1 Tube secured with: Tape Dental Injury: Teeth and Oropharynx as per pre-operative assessment

## 2012-10-09 NOTE — Anesthesia Preprocedure Evaluation (Addendum)
Anesthesia Evaluation  Patient identified by MRN, date of birth, ID band Patient awake    Reviewed: Allergy & Precautions, H&P , NPO status , Patient's Chart, lab work & pertinent test results  Airway Mallampati: III TM Distance: <3 FB Neck ROM: Full    Dental no notable dental hx.    Pulmonary neg pulmonary ROS,  breath sounds clear to auscultation  Pulmonary exam normal       Cardiovascular hypertension, + CAD, + Past MI, + Cardiac Stents and DVT Rhythm:Regular Rate:Normal + Systolic murmurs  Left ventricle: The cavity size was normal. Wall thickness   was increased in a pattern of mild LVH. Systolic function   was normal. The estimated ejection fraction was in the   range of 55% to 60%. Regional wall motion abnormalities   cannot be excluded. Doppler parameters are consistent with   abnormal left ventricular relaxation (grade 1 diastolic   dysfunction).    Neuro/Psych negative neurological ROS  negative psych ROS   GI/Hepatic negative GI ROS, Neg liver ROS, GERD-  Medicated,  Endo/Other  negative endocrine ROS  Renal/GU negative Renal ROS  negative genitourinary   Musculoskeletal negative musculoskeletal ROS (+)   Abdominal (+) + obese,   Peds negative pediatric ROS (+)  Hematology negative hematology ROS (+)   Anesthesia Other Findings   Reproductive/Obstetrics negative OB ROS                         Anesthesia Physical Anesthesia Plan  ASA: III  Anesthesia Plan: General   Post-op Pain Management:    Induction: Intravenous  Airway Management Planned: LMA  Additional Equipment:   Intra-op Plan:   Post-operative Plan:   Informed Consent: I have reviewed the patients History and Physical, chart, labs and discussed the procedure including the risks, benefits and alternatives for the proposed anesthesia with the patient or authorized representative who has indicated his/her  understanding and acceptance.   Dental advisory given  Plan Discussed with: CRNA and Surgeon  Anesthesia Plan Comments: (Patient prefers GA)       Anesthesia Quick Evaluation

## 2012-10-09 NOTE — Preoperative (Signed)
Beta Blockers   Reason not to administer Beta Blockers:Not Applicable 

## 2012-10-10 ENCOUNTER — Encounter (HOSPITAL_COMMUNITY): Payer: Self-pay | Admitting: Orthopedic Surgery

## 2012-10-10 DIAGNOSIS — T8450XA Infection and inflammatory reaction due to unspecified internal joint prosthesis, initial encounter: Secondary | ICD-10-CM | POA: Diagnosis not present

## 2012-10-10 DIAGNOSIS — Z96659 Presence of unspecified artificial knee joint: Secondary | ICD-10-CM | POA: Diagnosis not present

## 2012-10-10 LAB — CBC
HCT: 37.3 % — ABNORMAL LOW (ref 39.0–52.0)
MCHC: 33 g/dL (ref 30.0–36.0)
MCV: 83.8 fL (ref 78.0–100.0)
Platelets: 177 10*3/uL (ref 150–400)
RDW: 15.2 % (ref 11.5–15.5)
WBC: 6.2 10*3/uL (ref 4.0–10.5)

## 2012-10-10 LAB — BASIC METABOLIC PANEL
BUN: 27 mg/dL — ABNORMAL HIGH (ref 6–23)
Chloride: 106 mEq/L (ref 96–112)
Creatinine, Ser: 1.34 mg/dL (ref 0.50–1.35)
GFR calc Af Amer: 56 mL/min — ABNORMAL LOW (ref >90)
GFR calc non Af Amer: 49 mL/min — ABNORMAL LOW (ref >90)
Potassium: 3.8 mEq/L (ref 3.5–5.1)

## 2012-10-10 LAB — PROTIME-INR: Prothrombin Time: 16.9 seconds — ABNORMAL HIGH (ref 11.6–15.2)

## 2012-10-10 MED ORDER — VANCOMYCIN HCL 10 G IV SOLR
2000.0000 mg | Freq: Once | INTRAVENOUS | Status: AC
  Administered 2012-10-10: 2000 mg via INTRAVENOUS
  Filled 2012-10-10: qty 2000

## 2012-10-10 MED ORDER — SODIUM CHLORIDE 0.9 % IJ SOLN
10.0000 mL | INTRAMUSCULAR | Status: DC | PRN
  Administered 2012-10-11: 10 mL

## 2012-10-10 MED ORDER — VANCOMYCIN HCL 10 G IV SOLR
1500.0000 mg | INTRAVENOUS | Status: DC
  Administered 2012-10-11: 1500 mg via INTRAVENOUS
  Filled 2012-10-10: qty 1500

## 2012-10-10 MED ORDER — WARFARIN SODIUM 2.5 MG PO TABS
2.5000 mg | ORAL_TABLET | Freq: Once | ORAL | Status: AC
  Administered 2012-10-10: 2.5 mg via ORAL
  Filled 2012-10-10: qty 1

## 2012-10-10 NOTE — Clinical Documentation Improvement (Signed)
THIS DOCUMENT IS NOT A PERMANENT PART OF THE MEDICAL RECORD  Please update your documentation with the medical record to reflect your response to this query. If you need help knowing how to do this please call [phone].  10/10/12   Dear Dr. Lequita Halt / Associates,  In a better effort to capture your patient's severity of illness, reflect appropriate length of stay and utilization of resources, a review of the patient medical record has revealed the following indicators.    Based on your clinical judgment, please clarify and document in a progress note and/or discharge summary the clinical condition associated with the following supporting information:  In responding to this query please exercise your independent judgment.  The fact that a query is asked, does not imply that any particular answer is desired or expected. Because that is documentation in the medical record of "debridement" clarification is needed.   Pt had a Left knee arthrotomy with irrigation and debridement and polyethylene revision  Clarification Needed   Please clarify if the "debridement" was classified as Non-excisional or Excisional debridement and document in pn or d/c summary   If Excisional debridement please note the instrument used.      Please document the below (4) key elements in a progress note:  1.  Type of Debridement:          Excisional Debridement-  Cutting away necrotic, devitalized tissue or slough to  Level of viable tissue using a sharp instrument (example, scalpel, scissors, etc).           NON Excisional Debridement-  The removal of necrotic, devitalized tissue or slough by means of scraping, mechanical brushing, flushing, or washing. (example: irrigation, whirlpool);  Minor removal of loose fragments  2.  Please indicate instrument used:  Scissors, Scalpel, Curette, or other  3.  Please document depth of the debridement:             Partial Thickness  Full Thickness  Skin and  Subcutaneous Tissue  Subcutaneous Tissue and Muscle  Subcutaneous Tissue, Muscle and Bone  Other  4.  Document the size of the debridement.  You may use possible, probable, or suspect with inpatient documentation. possible, probable, suspected diagnoses MUST be documented at the time of discharge  Reviewed: additional documentation in the medical record  Thank Barnabas Harries  [phone] Health Information Management [place]

## 2012-10-10 NOTE — Clinical Documentation Improvement (Signed)
THIS DOCUMENT IS NOT A PERMANENT PART OF THE MEDICAL RECORD  Please update your documentation with the medical record to reflect your response to this query. If you need help knowing how to do this please call [phone].  10/10/12   Dear Stormy Fabian, F/Associates,  In a better effort to capture your patient's severity of illness, reflect appropriate length of stay and utilization of resources, a review of the patient medical record has revealed the following indicators.    Based on your clinical judgment, please clarify and document in a progress note and/or discharge summary the clinical condition associated with the following supporting information:  In responding to this query please exercise your independent judgment.  The fact that a query is asked, does not imply that any particular answer is desired or expected.  Clarification Needed   Please clarify the underlying diagnosis responsible for the clinical indicators below and document in pn or d/c summary    Abnormal BUN/CR/GFR=28/1.40/54    Possible Clinical Conditions?   _______CKD Stage I -  GFR > OR = 90 _______CKD Stage II - GFR 60-80 _______CKD Stage III - GFR 30-59 _______CKD Stage IV - GFR 15-29 _______CKD Stage V - GFR < 15 _______ESRD (End Stage Renal Disease) _______Other condition_____________ _______Cannot Clinically determine   Supporting Information:  Risk Factors:  Signs & Symptoms:   Diagnostics:  Lab:  Component      BUN  Latest Ref Rng      6 - 23 mg/dL  07/09/1306     6:57 PM 28 (H)  10/10/2012      27 (H)   Component      Creatinine  Latest Ref Rng      0.50 - 1.35 mg/dL  09/09/6960     9:52 PM 8.41 (H)   Component      GFR calc non Af Amer  Latest Ref Rng      >90 mL/min  10/09/2012     3:20 PM 46 (L)   Component      GFR calc non Af Amer  Latest Ref Rng      >90 mL/min  10/10/2012      49 (L)   Treatment: Monitoring  You may use possible, probable, or suspect with  inpatient documentation. possible, probable, suspected diagnoses MUST be documented at the time of discharge  Reviewed:  no additional documentation provided   Thank You,  Enis Slipper  RN, BSN, MSN/Inf, CCDS Clinical Documentation Specialist [facility] Dept Pager: [phone] / E-mail: [email or web address]  [phone] Health Information Management [place]

## 2012-10-10 NOTE — Progress Notes (Signed)
   Subjective: 1 Day Post-Op Procedure(s) (LRB): LEFT KNEE ARTHROTOMY (Left) IRRIGATION AND DEBRIDEMENT LEFT KNEE WITH POLY REVISION (Left) Patient reports pain as mild.   We will start therapy today.  Plan is to go Home after hospital stay.  Objective: Vital signs in last 24 hours: Temp:  [97.4 F (36.3 C)-99.3 F (37.4 C)] 97.4 F (36.3 C) (09/04 0517) Pulse Rate:  [62-82] 68 (09/04 0517) Resp:  [14-20] 20 (09/04 0517) BP: (128-158)/(68-89) 135/88 mmHg (09/04 0517) SpO2:  [94 %-100 %] 96 % (09/04 0517) Weight:  [240 lb (108.863 kg)] 240 lb (108.863 kg) (09/03 1400)  Intake/Output from previous day:  Intake/Output Summary (Last 24 hours) at 10/10/12 0731 Last data filed at 10/10/12 0518  Gross per 24 hour  Intake   1175 ml  Output    985 ml  Net    190 ml    Intake/Output this shift:    Labs:  Recent Labs  10/09/12 1520 10/10/12 0511  HGB 14.0 12.3*    Recent Labs  10/09/12 1520 10/10/12 0511  WBC 7.7 6.2  RBC 4.91 4.45  HCT 40.4 37.3*  PLT 219 177    Recent Labs  10/09/12 1520 10/10/12 0511  NA 137 137  K 4.0 3.8  CL 106 106  CO2 22 25  BUN 28* 27*  CREATININE 1.40* 1.34  GLUCOSE 103* 132*  CALCIUM 9.3 8.7    Recent Labs  10/09/12 1520 10/10/12 0511  INR 1.41 1.41    EXAM General - Patient is Alert, Appropriate and Oriented Extremity - Neurologically intact Neurovascular intact No cellulitis present Compartment soft Dressing - dressing C/D/I Motor Function - intact, moving foot and toes well on exam.  Hemovac pulled without difficulty.  Past Medical History  Diagnosis Date  . CAD, NATIVE VESSEL     PCI and DES to circumflex  . DVT 2006    on coumadin x1 year, IVC filter placed  . Mixed hyperlipidemia   . PROSTATE CANCER   . GERD   . Prostate cancer     s/p mutliple urologic procedures  . Pulmonary embolus 2013    LIFETIME ANTICOAGULATION BECAUSE OF PE AND PAST HX OF DVT  . MI (myocardial infarction) 08/25/09  .  HYPERTENSION     PT DENIES HAVING HYPERTENSION  . Urinary incontinence     MULTIPLE BLADDER SURGERIES - STATES NO URINARY SPHINCTER - PT'S UROLOGIST IS AT [facility]- DR. PETERSON  ( LAST VISIT WAS 09/15/11 )    Assessment/Plan: 1 Day Post-Op Procedure(s) (LRB): LEFT KNEE ARTHROTOMY (Left) IRRIGATION AND DEBRIDEMENT LEFT KNEE WITH POLY REVISION (Left) Principal Problem:   Septic arthritis of knee, left   Advance diet Up with therapy D/C IV fluids Plan for discharge tomorrow Picc line for IV antibiotics ID consult- duration and type of antibiotic  DVT Prophylaxis - Lovenox and Coumadin Weight-Bearing as tolerated to left leg   Ladd Cen V 10/10/2012, 7:31 AM

## 2012-10-10 NOTE — Evaluation (Signed)
Occupational Therapy Evaluation Patient Details Name: [name] MRN: [id number] DOB: 1931-03-14 Today's Date: 10/10/2012 Time: 1420-1500 OT Time Calculation (min): 40 min  OT Assessment / Plan / Recommendation History of present illness This 79 y.o. male admitted with acute septic arthritis Lt. Knee. Underwent IRRIGATION AND DEBRIDEMENT LEFT KNEE WITH POLY REVISION (Left)   Clinical Impression   Patient evaluated by Occupational Therapy with no further acute OT needs identified. All education has been completed and the patient has no further questions. Wife will assist with LB ADLs.  See below for any follow-up Occupational Therapy or equipment needs. OT is signing off. Thank you for this referral.     OT Assessment  Patient does not need any further OT services    Follow Up Recommendations  No OT follow up;Supervision - Intermittent    Barriers to Discharge      Equipment Recommendations  None recommended by OT    Recommendations for Other Services    Frequency       Precautions / Restrictions Precautions Precautions: Fall Required Braces or Orthoses: Knee Immobilizer - Left Restrictions Weight Bearing Restrictions: No   Pertinent Vitals/Pain     ADL  Eating/Feeding: Independent Where Assessed - Eating/Feeding: Chair;Bed level Grooming: Wash/dry hands;Min guard Where Assessed - Grooming: Supported standing Upper Body Bathing: Set up Where Assessed - Upper Body Bathing: Supported sitting Lower Body Bathing: Moderate assistance Where Assessed - Lower Body Bathing: Supported sit to stand Upper Body Dressing: Set up Where Assessed - Upper Body Dressing: Unsupported sitting Lower Body Dressing: Moderate assistance Where Assessed - Lower Body Dressing: Supported sit to stand Toilet Transfer: Hydrographic surveyor Method: Sit to stand;Stand Wellsite geologist: Comfort height toilet Toileting - Architect and Hygiene: Min guard Where  Assessed - Engineer, mining and Hygiene: Standing Equipment Used: Rolling walker;Knee Immobilizer Transfers/Ambulation Related to ADLs: min guard assist ADL Comments: Pt. reports wife will assist with LB Bathing and dressing.  He feels confident with ability to perform tub transfer, when cleared to shower, and does not feel he needs to practice with OT.  Discussed obstacles and safety concernes and he verbalizes understanding.      OT Diagnosis:    OT Problem List:   OT Treatment Interventions:     OT Goals(Current goals can be found in the care plan section)    Visit Information  Last OT Received On: 10/10/12 Assistance Needed: +1 PT/OT Co-Evaluation/Treatment: Yes History of Present Illness: This 79 y.o. male admitted with acute septic arthritis Lt. Knee. Underwent IRRIGATION AND DEBRIDEMENT LEFT KNEE WITH POLY REVISION (Left)       Prior Functioning     Home Living Family/patient expects to be discharged to:: Private residence Available Help at Discharge: Family Type of Home: House Entrance [place] of Steps: 4 Entrance Stairs-Rails: None Home Layout: One level Home Equipment: Environmental consultant - 2 wheels;Bedside commode Prior Function Level of Independence: Needs assistance ADL's / Homemaking Assistance Needed: assist with sock and shoes Communication Communication: No difficulties Dominant Hand: Right         Vision/Perception     Cognition  Cognition Arousal/Alertness: Awake/alert Behavior During Therapy: WFL for tasks assessed/performed Overall Cognitive Status: Within Functional Limits for tasks assessed    Extremity/Trunk Assessment Upper Extremity Assessment Upper Extremity Assessment: Overall WFL for tasks assessed Lower Extremity Assessment Lower Extremity Assessment: Defer to PT evaluation     Mobility Bed Mobility Bed Mobility: Supine to Sit;Sitting - Scoot to Edge of Bed Supine to  Sit: 3: Mod assist;With rails;HOB elevated Sitting -  Scoot to Edge of Bed: 3: Mod assist;With rail Details for Bed Mobility Assistance: Pt with difficulty lifting shoulders off bed and with hand placement Transfers Transfers: Sit to Stand;Stand to Sit Sit to Stand: 4: Min assist;With upper extremity assist;From bed;4: Min guard;From toilet Stand to Sit: 4: Min guard;With upper extremity assist;To chair/3-in-1;To toilet Details for Transfer Assistance: Pt initially required min A for sit to stand, but progressed to min guard assist.      Exercise     Balance     End of Session OT - End of Session Equipment Utilized During Treatment: Rolling walker;Left knee immobilizer Activity Tolerance: Patient tolerated treatment well Patient left: in chair;with call bell/phone within reach Nurse Communication: Mobility status  GO     Jeani Hawking M 10/10/2012, 3:45 PM

## 2012-10-10 NOTE — Progress Notes (Signed)
ANTIBIOTIC CONSULT NOTE - INITIAL  Pharmacy Consult for Vancomycin Indication: Prosthetic joint infection  Allergies  Allergen Reactions  . Darifenacin Hydrobromide Er Nausea Only    Dizziness   Patient Measurements: Height: 6' (182.9 cm) Weight: 240 lb (108.863 kg) IBW/kg (Calculated) : 77.6 Adjusted Body Weight: 91 kg   Vital Signs: Temp: 99.3 F (37.4 C) (09/04 1425) Temp src: Oral (09/04 1425) BP: 150/77 mmHg (09/04 1425) Pulse Rate: 66 (09/04 1425) Intake/Output from previous day: 09/03 0701 - 09/04 0700 In: 1415 [P.O.:240; I.V.:1125; IV Piggyback:50] Out: 985 [Urine:725; Drains:235; Blood:25] Intake/Output from this shift: Total I/O In: 1015 [P.O.:240; I.V.:675; IV Piggyback:100] Out: 900 [Urine:900]  Labs:  Recent Labs  10/09/12 1520 10/10/12 0511  WBC 7.7 6.2  HGB 14.0 12.3*  PLT 219 177  CREATININE 1.40* 1.34   Estimated Creatinine Clearance: 57 ml/min (by C-G formula based on Cr of 1.34). No results found for this basename: VANCOTROUGH, Leodis Binet, VANCORANDOM, GENTTROUGH, GENTPEAK, GENTRANDOM, TOBRATROUGH, TOBRAPEAK, TOBRARND, AMIKACINPEAK, AMIKACINTROU, AMIKACIN,  in the last 72 hours   Microbiology: Recent Results (from the past 720 hour(s))  WOUND CULTURE     Status: None   Collection Time    10/09/12  5:52 PM      Result Value Range Status   Specimen Description SYNOVIAL   Final   Special Requests LEFT KNEE   Final   Gram Stain     Final   Value: FEW WBC PRESENT,BOTH PMN AND MONONUCLEAR     NO ORGANISMS SEEN     Performed at [facility]   Culture PENDING   Incomplete   Report Status PENDING   Incomplete  ANAEROBIC CULTURE     Status: None   Collection Time    10/09/12  5:52 PM      Result Value Range Status   Specimen Description SYNOVIAL   Final   Special Requests LEFT KNEE   Final   Gram Stain     Final   Value: FEW WBC PRESENT,BOTH PMN AND MONONUCLEAR     NO ORGANISMS SEEN     Performed at [facility]   Culture      Final   Value: NO ANAEROBES ISOLATED; CULTURE IN PROGRESS FOR 5 DAYS     Performed at [facility]   Report Status PENDING   Incomplete    Medical History: Past Medical History  Diagnosis Date  . CAD, NATIVE VESSEL     PCI and DES to circumflex  . DVT 2006    on coumadin x1 year, IVC filter placed  . Mixed hyperlipidemia   . PROSTATE CANCER   . GERD   . Prostate cancer     s/p mutliple urologic procedures  . Pulmonary embolus 2013    LIFETIME ANTICOAGULATION BECAUSE OF PE AND PAST HX OF DVT  . MI (myocardial infarction) 08/25/09  . HYPERTENSION     PT DENIES HAVING HYPERTENSION  . Urinary incontinence     MULTIPLE BLADDER SURGERIES - STATES NO URINARY SPHINCTER - PT'S UROLOGIST IS AT [facility]- DR. PETERSON  ( LAST VISIT WAS 09/15/11 )   Medications:  Anti-infectives   Start     Dose/Rate Route Frequency Ordered Stop   10/11/12 1800  vancomycin (VANCOCIN) 1,500 mg in sodium chloride 0.9 % 500 mL IVPB     1,500 mg 250 mL/hr over 120 Minutes Intravenous Every 24 hours 10/10/12 1648     10/10/12 1800  vancomycin (VANCOCIN) 2,000 mg in sodium chloride 0.9 % 500  mL IVPB     2,000 mg 250 mL/hr over 120 Minutes Intravenous  Once 10/10/12 1648     10/10/12 0600  ceFAZolin (ANCEF) 3 g in dextrose 5 % 50 mL IVPB  Status:  Discontinued     3 g 160 mL/hr over 30 Minutes Intravenous On call to O.R. 10/09/12 1445 10/09/12 1450   10/10/12 0000  ceFAZolin (ANCEF) IVPB 2 g/50 mL premix  Status:  Discontinued     2 g 100 mL/hr over 30 Minutes Intravenous 4 times per day 10/09/12 2042 10/10/12 1630   10/09/12 1500  ceFAZolin (ANCEF) 3 g in dextrose 5 % 50 mL IVPB     3 g 160 mL/hr over 30 Minutes Intravenous On call to O.R. 10/09/12 1445 10/09/12 1725     Assessment: 81 yoM with presumed prosthetic joint infection of L TKA in 2009. Six days PTA noted redness and swelling of Left knee. Treated with Cephalexin as outpt by PCP, seen 9/2 by ortho MD, aspiration of joint.  Per ID: d/c  Cefazolin, begin Vancomycin dosed per Pharmacy  Joint aspiration cx 9/3: no organism seen on gram stain.  Goal of Therapy:  Vancomycin trough level 15-20 mcg/ml  Plan:   Vancomycin 2000 mg x1, then 1500mg  q24  PICC line placed this am  Await cx results  Plan for discharge tomorrow, will require at least 3 days of maintenance dose for steady state in order to measure trough.  Coordinate abx dosing and trough orders with home health.   Otho Bellows PharmD Pager [phone] 10/10/2012, 4:49 PM

## 2012-10-10 NOTE — Care Management Note (Addendum)
    Page 1 of 2   10/11/2012     6:35:15 PM   CARE MANAGEMENT NOTE 10/11/2012  Patient:  [name], [name]   Account Number:  [id number]  Date Initiated:  10/10/2012  Documentation initiated by:  [facility]  Subjective/Objective Assessment:   79 year old male admitted s/p left knee arthrotomy with I&D and polyethylene revision.     Action/Plan:   Home with IV atbiotics.   Anticipated DC Date:  10/13/2012   Anticipated DC Plan:  HOME W HOME HEALTH SERVICES      DC Planning Services  CM consult      [facility] Choice  HOME HEALTH   Choice offered to / List presented to:  C-1 Patient        HH arranged  HH-1 RN  IV Antibiotics  HH-2 PT      HH agency  [facility]   Status of service:  Completed, signed off Medicare Important Message given?  NA - LOS <3 / Initial given by admissions (If response is "NO", the following Medicare IM given date fields will be blank) Date Medicare IM given:   Date Additional Medicare IM given:    Discharge Disposition:    Per UR Regulation:  Reviewed for med. necessity/level of care/duration of stay  If discussed at Long Length of Stay Meetings, dates discussed:    Comments:  10/11/2012 Colleen Can BSN RN CCM [phone] [facility] WILL PROVIDE [facility] SERVICES FOR HOME IV ABX THERAPY AND HHPT UPON PT'S DISCHARGE.   10/10/2012 Colleen Can BSN RN CCM [phone] CM spoke with patient.  Plans are for him to return to his home in [place] where spouse will be caregiver. He already has DME-RW, commode seat. Wants to use [place] for [facility] services. Picc line is in place. Pt will need home IV abx. CM will follow for HH orders.

## 2012-10-10 NOTE — Consult Note (Signed)
[facility]    Date of Admission:  10/09/2012    Total days of antibiotics 4        Day 1 cefazolin               Reason for Consult: Left prosthetic knee infection    Referring Physician: Dr. Trudee [name]  Principal Problem:   Infection of prosthetic left knee joint Active Problems:   PROSTATE CANCER   HYPERTENSION   CAD, NATIVE VESSEL   DVT   GERD   Mixed hyperlipidemia   Overweight(278.02)   Pulmonary embolism   Long term (current) use of anticoagulants   . aspirin EC  81 mg Oral Daily  .  ceFAZolin (ANCEF) IV  2 g Intravenous Q6H  . docusate sodium  100 mg Oral BID  . enoxaparin (LOVENOX) injection  30 mg Subcutaneous Q12H  . fenofibrate  160 mg Oral Daily  . pantoprazole  80 mg Oral Daily  . warfarin  2.5 mg Oral ONCE-1800  . Warfarin - Pharmacist Dosing Inpatient   Does not apply q1800    Recommendations: 1. Change cefazolin to vancomycin pending final culture results   Assessment: He appears to have infection of his left prosthetic knee. It could [facility] but was unable to locate results of any Gram stain or cultures from fluid aspirated in the office. No organisms were seen on yesterday's operative specimen and those cultures are negative so far. I will switch him to empiric vancomycin to treat the most common culprits including methicillin-resistant staph. He may need the addition of broader gram-negative rod coverage before discharge. I will try to locate all culture results and followup tomorrow.   HPI: [name] is a 79 y.o. male who underwent left total knee arthroplasty in 2009. 6 days ago he fell asleep in his living room chair after working in the yard. When he woke his knee was warm, swollen and painful. He had some chills and sweats. The following day he, August 31, he saw primary care physician at [facility]. She told him that he looked like he was infected and noticed some red  bumps in the popliteal fossa. She started him on oral cephalexin and instructed him to call Dr. Despina [name] on September 2, right after Labor Day. He was seen in the office that afternoon and had fluid aspirated from the joint. He was admitted yesterday and underwent incision and drainage with poly-exchange. He is feeling better and has been up walking with minimal pain.   Review of Systems: Pertinent items are noted in HPI.  Past Medical History  Diagnosis Date  . CAD, NATIVE VESSEL     PCI and DES to circumflex  . DVT 2006    on coumadin x1 year, IVC filter placed  . Mixed hyperlipidemia   . PROSTATE CANCER   . GERD   . Prostate cancer     s/p mutliple urologic procedures  . Pulmonary embolus 2013    LIFETIME ANTICOAGULATION BECAUSE OF PE AND PAST HX OF DVT  . MI (myocardial infarction) 08/25/09  . HYPERTENSION     PT DENIES HAVING HYPERTENSION  . Urinary incontinence     MULTIPLE BLADDER SURGERIES - STATES NO URINARY SPHINCTER - PT'S UROLOGIST IS AT [facility]- DR. PETERSON  ( LAST VISIT WAS 09/15/11 )    History  Substance Use Topics  . Smoking status: Former Smoker    Quit date: 04/27/1961  .  Smokeless tobacco: Not on file  . Alcohol Use: No    History reviewed. No pertinent family history. Allergies  Allergen Reactions  . Darifenacin Hydrobromide Er Nausea Only    Dizziness    OBJECTIVE: Blood pressure 150/77, pulse 66, temperature 99.3 F (37.4 C), temperature source Oral, resp. rate 20, height 6' (1.829 m), weight 108.863 kg (240 lb), SpO2 98.00%. General: He is in good spirits, sitting up in a chair visiting with friends Skin: No rash. New PICC placed in his right arm Lungs: Clear  Cor: Regular S1 and S2 no murmurs Left knee dressed  Microbiology: Recent Results (from the past 240 hour(s))  WOUND CULTURE     Status: None   Collection Time    10/09/12  5:52 PM      Result Value Range Status   Specimen Description SYNOVIAL   Final   Special Requests LEFT KNEE   Final    Gram Stain     Final   Value: FEW WBC PRESENT,BOTH PMN AND MONONUCLEAR     NO ORGANISMS SEEN     Performed at [facility]   Culture PENDING   Incomplete   Report Status PENDING   Incomplete  ANAEROBIC CULTURE     Status: None   Collection Time    10/09/12  5:52 PM      Result Value Range Status   Specimen Description SYNOVIAL   Final   Special Requests LEFT KNEE   Final   Gram Stain     Final   Value: FEW WBC PRESENT,BOTH PMN AND MONONUCLEAR     NO ORGANISMS SEEN     Performed at [facility]   Culture     Final   Value: NO ANAEROBES ISOLATED; CULTURE IN PROGRESS FOR 5 DAYS     Performed at [facility]   Report Status PENDING   Incomplete    Noah Asters, MD [facility] [phone] pager   [phone] cell 10/10/2012, 4:14 PM

## 2012-10-10 NOTE — Progress Notes (Signed)
ANTICOAGULATION CONSULT NOTE - Follow Up Consult  Pharmacy Consult for Warfarin Indication: Hx DVT, post op VTE prophylaxis  Allergies  Allergen Reactions  . Darifenacin Hydrobromide Er Nausea Only    Dizziness    Patient Measurements: Height: 6' (182.9 cm) Weight: 240 lb (108.863 kg) IBW/kg (Calculated) : 77.6  Vital Signs: Temp: 97.4 F (36.3 C) (09/04 0517) Temp src: Oral (09/04 0517) BP: 135/88 mmHg (09/04 0517) Pulse Rate: 68 (09/04 0517)  Labs:  Recent Labs  10/09/12 1520 10/10/12 0511  HGB 14.0 12.3*  HCT 40.4 37.3*  PLT 219 177  APTT 32  --   LABPROT 16.9* 16.9*  INR 1.41 1.41  CREATININE 1.40* 1.34    Estimated Creatinine Clearance: 57 ml/min (by C-G formula based on Cr of 1.34).   Medications:  Scheduled:  . aspirin EC  81 mg Oral Daily  .  ceFAZolin (ANCEF) IV  2 g Intravenous Q6H  . docusate sodium  100 mg Oral BID  . enoxaparin (LOVENOX) injection  30 mg Subcutaneous Q12H  . fenofibrate  160 mg Oral Daily  . pantoprazole  80 mg Oral Daily  . Warfarin - Pharmacist Dosing Inpatient   Does not apply q1800   Infusions:  . sodium chloride 100 mL/hr at 10/09/12 1924    Assessment: 81 YOM s/p L knee arthrotomy for septic arthritis of knee. He was taking warfarin prior to holding for surgery for h/o DVT. He does have IVC filter.  Home warfarin dosing: per coumadin clinic visit 5mg  on TuSa and 2.5 mg on all other days. Home aspirin 81mg  daily resumed.  Warfarin 5mg  resumed last night - given at midnight  INR unchanged today as expected - 1.41  CBC slightly decreased as expected post op, no bleeding/complications reported.  Goal of Therapy:  INR 2-3 Monitor platelets by anticoagulation protocol: Yes   Plan:   Warfarin 2.5mg  po today at 18:00  Continue lovenox 30mg  sq q12h until INR > or = 1.8  Daily PT/INR    Loralee Pacas, PharmD, BCPS Pager: [phone] 10/10/2012,7:09 AM

## 2012-10-10 NOTE — Progress Notes (Signed)
Peripherally Inserted Central Catheter/Midline Placement  The IV Nurse has discussed with the patient and/or persons authorized to consent for the patient, the purpose of this procedure and the potential benefits and risks involved with this procedure.  The benefits include less needle sticks, lab draws from the catheter and patient may be discharged home with the catheter.  Risks include, but not limited to, infection, bleeding, blood clot (thrombus formation), and puncture of an artery; nerve damage and irregular heat beat.  Alternatives to this procedure were also discussed.  PICC/Midline Placement Documentation        Mellissa Kohut 10/10/2012, 11:02 AM

## 2012-10-10 NOTE — Op Note (Addendum)
NAMEJEDREK, DINOVO            ACCOUNT NO.:  [id number]  MEDICAL RECORD NO.:  [id number]  LOCATION:  1615                         FACILITY:  [facility]  PHYSICIAN:  Ollen Gross, M.D.    DATE OF BIRTH:  14-Sep-1931  DATE OF PROCEDURE:  10/09/2012 DATE OF DISCHARGE:                              OPERATIVE REPORT   PREOPERATIVE DIAGNOSIS:  Acute septic arthritis, left knee.  POSTOPERATIVE DIAGNOSIS:  Acute septic arthritis, left knee.  PROCEDURE:  Left knee arthrotomy with irrigation and debridement and polyethylene revision.  SURGEON:  Ollen Gross, M.D.  ASSISTANT:  None.  ESTIMATED BLOOD LOSS:  Minimal.  DRAINS:  Hemovac x1.  TOURNIQUET TIME:  37 minutes at 300 mmHg.  COMPLICATIONS:  None.  CONDITION:  Stable to recovery.  BRIEF CLINICAL NOTE:  Mr. Espin is a 79 year old male, who had a total knee arthroplasty approximately 5 years ago but actually no problems whatsoever.  He was doing fine but then 4 days ago was in the yard, doing the yard work on a very hot day.  Came inside and took a nap and when he woke up, he had a painful swelling in his left knee, was unable to bear weight.  He felt ill at the time.  He went to an urgent care the next day, was started on antibiotics.  I saw him yesterday and did a knee aspiration showing purulent fluid.  He presents today for irrigation and debridement and poly exchange and IV antibiotics.  PROCEDURE IN DETAIL:  After successful administration of general anesthetic, a tourniquet was placed high on his left thigh and left lower extremity, prepped and draped in the usual sterile fashion. Extremity was wrapped in Esmarch, knee flexed, and tourniquet inflated to 300 mmHg.  A midline incision was made with a 10 blade through subcutaneous tissue to the extensor mechanism.  A fresh blade was used to make a medial parapatellar arthrotomy.  Seropurulent fluid was encountered in the joint.  I did a thorough synovectomy.  The  soft tissue proximal and medial tibia and subperiosteally elevated joint line with the knife, the semimembranosus bursa with a Cobb elevator.  Soft tissue laterally was also elevated with attention being paid to avoid patellar tendon on tibial tubercle.  I then everted the patella, but subluxed.  The synovectomy in the lateral gutter.  Also did a synovectomy, medial gutter and suprapatellar pouch.  We then elevated the polyethylene of the tibial component.  I used an oscillating saw to remove the spout on the under surface and then removed the polyethylene and subsequently the spout.  I did a posterior synovectomy.  Then, thoroughly irrigated the joint with 3 L of saline.  We then placed about 500 mL of Betadine through the joint and I left let it sit in joint for about 5 minutes to soak the tissues.  I then did another 3 L bag of saline and pulsatile lavage.  Halfway through that bag, I placed the tibial polyethylene which was a new tibial polyethylene size 512.5 mm thickness.  We reduced the knee with excellent stability.  Finished irrigating with the rest of that 3 L bag.  We then mixed 5 mL stimulan beads with vancomycin  and placed those through the medial and lateral gutters and suprapatellar pouch.  The arthrotomy was then closed over Hemovac drain with a running #1 V-Loc suture.  Flexion against gravity to 130 degrees, patella tracks normally.  Tourniquet was released total time 37 minutes.  Subcu was closed with interrupted 2-0 Vicryl and subcuticular running 4-0 Monocryl.  The drain was hooked to suction. Incision cleaned and dried, and Steri-Strips and a bulky sterile dressing applied.  He was placed into a knee immobilizer, awakened, transported to recovery in stable condition.     Ollen Gross, M.D.     FA/MEDQ  D:  10/09/2012  T:  10/10/2012  Job:  [id number]  Please note that this was both an incisional and excisional debridement. It was excisional in that tissue was  removed with a knife and incisional in that tissue was also cleansed and debrided with pulsatile fluid ( saline and betadine)

## 2012-10-10 NOTE — Evaluation (Signed)
Physical Therapy Evaluation Patient Details Name: [name] MRN: [id number] DOB: [birth year] Today's Date: 10/10/2012 Time: 1415-1500 PT Time Calculation (min): 45 min  PT Assessment / Plan / Recommendation History of Present Illness  This 79 y.o. male admitted with acute septic arthritis Lt. Knee. Underwent IRRIGATION AND DEBRIDEMENT LEFT KNEE WITH POLY REVISION (Left)  Clinical Impression  Pt ambulated very well. Pt plans for DC in AM/ Pt will benefit from PT while in acute care to improve safety with RW, increase ROM and strength of LLE.     PT Assessment  Patient needs continued PT services    Follow Up Recommendations  Home health PT    Does the patient have the potential to tolerate intense rehabilitation      Barriers to Discharge        Equipment Recommendations  None recommended by PT    Recommendations for Other Services     Frequency 7X/week    Precautions / Restrictions Precautions Precautions: Fall;Knee Required Braces or Orthoses: Knee Immobilizer - Left Restrictions Weight Bearing Restrictions: No   Pertinent Vitals/Pain 2 superior to knee. Received pain meds.      Mobility  Bed Mobility Bed Mobility: Supine to Sit;Sitting - Scoot to Edge of Bed Supine to Sit: 3: Mod assist;With rails;HOB elevated Sitting - Scoot to Edge of Bed: 3: Mod assist;With rail Details for Bed Mobility Assistance: Pt with difficulty lifting shoulders off bed and with hand placement Transfers Sit to Stand: 4: Min assist;With upper extremity assist;From bed;4: Min guard;From toilet Stand to Sit: 4: Min guard;With upper extremity assist;To chair/3-in-1;To toilet Details for Transfer Assistance: Pt initially required min A for sit to stand, but progressed to min guard assist.  Ambulation/Gait Ambulation/Gait Assistance: 4: Min assist;4: Min guard Ambulation Distance (Feet): 300 Feet Assistive device: Rolling walker Ambulation/Gait Assistance Details: cues for safe use of  RW, step lenght, not to walk away from RW. Gait Pattern: Step-through pattern;Antalgic    Exercises     PT Diagnosis: Difficulty walking;Acute pain  PT Problem List: Decreased strength;Decreased range of motion;Decreased activity tolerance;Decreased mobility;Decreased knowledge of precautions;Decreased safety awareness;Decreased knowledge of use of DME;Pain PT Treatment Interventions: DME instruction;Gait training;Stair training;Functional mobility training;Therapeutic activities;Therapeutic exercise;Patient/family education     PT Goals(Current goals can be found in the care plan section) Acute Rehab PT Goals Patient Stated Goal: To go on a vacation. PT Goal Formulation: With patient Time For Goal Achievement: 10/17/12 Potential to Achieve Goals: Good  Visit Information  Last PT Received On: 10/10/12 Assistance Needed: +1 History of Present Illness: This 79 y.o. male admitted with acute septic arthritis Lt. Knee. Underwent IRRIGATION AND DEBRIDEMENT LEFT KNEE WITH POLY REVISION (Left)       Prior Functioning  Home Living Family/patient expects to be discharged to:: Private residence Available Help at Discharge: Family Type of Home: House Entrance Potter of Steps: 4 Entrance Stairs-Rails: None Home Layout: One level Home Equipment: Environmental consultant - 2 wheels;Bedside commode Prior Function Level of Independence: Needs assistance ADL's / Homemaking Assistance Needed: assist with sock and shoes Communication Communication: No difficulties Dominant Hand: Right    Cognition  Cognition Arousal/Alertness: Awake/alert Behavior During Therapy: WFL for tasks assessed/performed Overall Cognitive Status: Within Functional Limits for tasks assessed    Extremity/Trunk Assessment Upper Extremity Assessment Upper Extremity Assessment: Overall WFL for tasks assessed Lower Extremity Assessment Lower Extremity Assessment: LLE deficits/detail LLE Deficits / Details: pt is able to perform  SLR   Balance    End of Session PT -  End of Session Activity Tolerance: Patient tolerated treatment well Patient left: in chair;with call bell/phone within reach Nurse Communication: Mobility status  GP     Rada Hay 10/10/2012, 4:47 PM

## 2012-10-11 DIAGNOSIS — T8450XA Infection and inflammatory reaction due to unspecified internal joint prosthesis, initial encounter: Secondary | ICD-10-CM | POA: Diagnosis not present

## 2012-10-11 DIAGNOSIS — Z96659 Presence of unspecified artificial knee joint: Secondary | ICD-10-CM | POA: Diagnosis not present

## 2012-10-11 LAB — PROTIME-INR
INR: 1.74 — ABNORMAL HIGH (ref 0.00–1.49)
Prothrombin Time: 19.8 seconds — ABNORMAL HIGH (ref 11.6–15.2)

## 2012-10-11 LAB — SEDIMENTATION RATE: Sed Rate: 46 mm/hr — ABNORMAL HIGH (ref 0–16)

## 2012-10-11 LAB — CBC
HCT: 36 % — ABNORMAL LOW (ref 39.0–52.0)
Platelets: 212 10*3/uL (ref 150–400)
RDW: 14.6 % (ref 11.5–15.5)
WBC: 9.3 10*3/uL (ref 4.0–10.5)

## 2012-10-11 LAB — BASIC METABOLIC PANEL
Chloride: 104 mEq/L (ref 96–112)
GFR calc Af Amer: 61 mL/min — ABNORMAL LOW (ref >90)
Potassium: 3.8 mEq/L (ref 3.5–5.1)

## 2012-10-11 LAB — URINE CULTURE
Colony Count: NO GROWTH
Culture: NO GROWTH

## 2012-10-11 MED ORDER — ENOXAPARIN SODIUM 30 MG/0.3ML ~~LOC~~ SOLN: 30.0000 mg | Freq: Two times a day (BID) | SUBCUTANEOUS | Status: DC

## 2012-10-11 MED ORDER — RIFAMPIN 300 MG PO CAPS
300.0000 mg | ORAL_CAPSULE | Freq: Two times a day (BID) | ORAL | Status: DC
  Administered 2012-10-11: 300 mg via ORAL
  Filled 2012-10-11 (×2): qty 1

## 2012-10-11 MED ORDER — CIPROFLOXACIN HCL 750 MG PO TABS: 750.0000 mg | ORAL_TABLET | Freq: Two times a day (BID) | ORAL | Status: DC

## 2012-10-11 MED ORDER — VANCOMYCIN HCL 10 G IV SOLR: 1500.0000 mg | INTRAVENOUS | Status: DC

## 2012-10-11 MED ORDER — WARFARIN SODIUM 2.5 MG PO TABS: 2.5000 mg | ORAL_TABLET | Freq: Every day | ORAL | Status: DC

## 2012-10-11 MED ORDER — WARFARIN SODIUM 2.5 MG PO TABS
2.5000 mg | ORAL_TABLET | Freq: Once | ORAL | Status: AC
  Administered 2012-10-11: 2.5 mg via ORAL
  Filled 2012-10-11: qty 1

## 2012-10-11 MED ORDER — METHOCARBAMOL 500 MG PO TABS: 500.0000 mg | ORAL_TABLET | Freq: Four times a day (QID) | ORAL | Status: DC | PRN

## 2012-10-11 MED ORDER — ENOXAPARIN (LOVENOX) PATIENT EDUCATION KIT
PACK | Freq: Once | Status: DC
  Filled 2012-10-11 (×2): qty 1

## 2012-10-11 MED ORDER — OXYCODONE HCL 5 MG PO TABS: 5.0000 mg | ORAL_TABLET | ORAL | Status: DC | PRN

## 2012-10-11 MED ORDER — RIFAMPIN 300 MG PO CAPS: 300.0000 mg | ORAL_CAPSULE | Freq: Two times a day (BID) | ORAL | Status: DC

## 2012-10-11 MED ORDER — HEPARIN SOD (PORK) LOCK FLUSH 100 UNIT/ML IV SOLN
250.0000 [IU] | Freq: Every day | INTRAVENOUS | Status: DC
  Filled 2012-10-11: qty 3

## 2012-10-11 MED ORDER — TRAMADOL HCL 50 MG PO TABS: 50.0000 mg | ORAL_TABLET | Freq: Four times a day (QID) | ORAL | Status: DC | PRN

## 2012-10-11 MED ORDER — HEPARIN SOD (PORK) LOCK FLUSH 100 UNIT/ML IV SOLN
250.0000 [IU] | INTRAVENOUS | Status: DC | PRN
  Filled 2012-10-11: qty 3

## 2012-10-11 MED ORDER — CIPROFLOXACIN HCL 750 MG PO TABS
750.0000 mg | ORAL_TABLET | Freq: Two times a day (BID) | ORAL | Status: DC
  Administered 2012-10-11: 750 mg via ORAL
  Filled 2012-10-11 (×3): qty 1

## 2012-10-11 NOTE — Progress Notes (Signed)
Patient ID: [name], male   DOB: 06-01-1931, 79 y.o.   MRN: [id number]         [facility]    Date of Admission:  10/09/2012    Total days of antibiotics 5        Day 1 vancomycin         Principal Problem:   Infection of prosthetic left knee joint Active Problems:   PROSTATE CANCER   HYPERTENSION   CAD, NATIVE VESSEL   DVT   GERD   Mixed hyperlipidemia   Overweight(278.02)   Pulmonary embolism   Long term (current) use of anticoagulants   . aspirin EC  81 mg Oral Daily  . docusate sodium  100 mg Oral BID  . enoxaparin (LOVENOX) injection  30 mg Subcutaneous Q12H  . fenofibrate  160 mg Oral Daily  . pantoprazole  80 mg Oral Daily  . vancomycin  1,500 mg Intravenous Q24H  . warfarin  2.5 mg Oral ONCE-1800  . Warfarin - Pharmacist Dosing Inpatient   Does not apply q1800    Subjective: He is feeling much better. He feels like the pain and swelling of his left knee has improved significantly. He is currently up walking in the hallway with physical therapy. Review of Systems: Pertinent items are noted in HPI.  Past Medical History  Diagnosis Date  . CAD, NATIVE VESSEL     PCI and DES to circumflex  . DVT 2006    on coumadin x1 year, IVC filter placed  . Mixed hyperlipidemia   . PROSTATE CANCER   . GERD   . Prostate cancer     s/p mutliple urologic procedures  . Pulmonary embolus 2013    LIFETIME ANTICOAGULATION BECAUSE OF PE AND PAST HX OF DVT  . MI (myocardial infarction) 08/25/09  . HYPERTENSION     PT DENIES HAVING HYPERTENSION  . Urinary incontinence     MULTIPLE BLADDER SURGERIES - STATES NO URINARY SPHINCTER - PT'S UROLOGIST IS AT [facility]- DR. PETERSON  ( LAST VISIT WAS 09/15/11 )    History  Substance Use Topics  . Smoking status: Former Smoker    Quit date: 04/27/1961  . Smokeless tobacco: Not on file  . Alcohol Use: No    History reviewed. No pertinent family history.  Allergies  Allergen Reactions  . Darifenacin  Hydrobromide Er Nausea Only    Dizziness    Objective: Temp:  [97.3 F (36.3 C)-99.3 F (37.4 C)] 98.2 F (36.8 C) (09/05 0549) Pulse Rate:  [65-68] 65 (09/05 0549) Resp:  [16-20] 16 (09/05 0549) BP: (146-157)/(68-81) 146/68 mmHg (09/05 0549) SpO2:  [94 %-98 %] 96 % (09/05 0549)  General: He is in no distress Skin: Right arm PICC site appears normal. He has some linear bruising on his left hip where he noticed some bumps and redness just before he began to have swelling of his knee several days ago. Lungs: Clear Cor: Regular S1 and S2 no murmurs Left knee: Swelling decreased. Gauze dressing over her incision is clean and dry  Lab Results Lab Results  Component Value Date   WBC 9.3 10/11/2012   HGB 12.3* 10/11/2012   HCT 36.0* 10/11/2012   MCV 82.6 10/11/2012   PLT 212 10/11/2012    Lab Results  Component Value Date   CREATININE 1.26 10/11/2012   BUN 24* 10/11/2012   NA 135 10/11/2012   K 3.8 10/11/2012   CL 104 10/11/2012   CO2 25 10/11/2012  Lab Results  Component Value Date   ALT 8 10/09/2012   AST 15 10/09/2012   ALKPHOS 52 10/09/2012   BILITOT 0.9 10/09/2012      Microbiology: Recent Results (from the past 240 hour(s))  URINE CULTURE     Status: None   Collection Time    10/09/12  5:52 PM      Result Value Range Status   Specimen Description URINE, RANDOM   Final   Special Requests URINE, CATHETERIZED   Final   Culture  Setup Time     Final   Value: 10/10/2012 01:58     Performed at [facility] Count     Final   Value: NO GROWTH     Performed at [facility]   Culture     Final   Value: NO GROWTH     Performed at [facility]   Report Status 10/11/2012 FINAL   Final  WOUND CULTURE     Status: None   Collection Time    10/09/12  5:52 PM      Result Value Range Status   Specimen Description SYNOVIAL   Final   Special Requests LEFT KNEE   Final   Gram Stain     Final   Value: FEW WBC PRESENT,BOTH PMN AND MONONUCLEAR     NO ORGANISMS SEEN       Performed at [facility]     Final   Value: NO GROWTH 1 DAY     Performed at [facility]   Report Status PENDING   Incomplete  ANAEROBIC CULTURE     Status: None   Collection Time    10/09/12  5:52 PM      Result Value Range Status   Specimen Description SYNOVIAL   Final   Special Requests LEFT KNEE   Final   Gram Stain     Final   Value: FEW WBC PRESENT,BOTH PMN AND MONONUCLEAR     NO ORGANISMS SEEN     Performed at [facility]     Final   Value: NO ANAEROBES ISOLATED; CULTURE IN PROGRESS FOR 5 DAYS     Performed at [facility]   Report Status PENDING   Incomplete    Studies/Results: Dg Chest 2 View  10/09/2012   *RADIOLOGY REPORT*  Clinical Data: Preoperative respiratory exam.  Acute septic arthritis of the left knee.  CHEST - 2 VIEW  Comparison: Chest x-ray dated 09/28/2011  Findings: The heart size and pulmonary vascularity are normal and the lungs are clear.  No acute osseous abnormality.  Arthritis at both shoulders.  Surgical clips in the left axilla.  IMPRESSION: No acute abnormality.   Original Report Authenticated By: Francene Boyers, M.D.    Assessment: He has culture-negative prosthetic knee infection. I was able to locate the outpatient aspirate cultures done on September 2. No organisms were seen on stain and cultures are final negative. Operative cultures from September 3 also did not show any organisms on stain and are negative so far. I checked with his pharmacy and he was prescribed cephalexin and that is probably why he has culture-negative infection. I still favor targeting MRSA along with other staph and strep and also water therapy for possible gram-negative rods. I've recommended continuing IV vancomycin along with oral ciprofloxacin and rifampin. He'll need a therapy for 6 weeks and then conversion to an oral regimen to complete at least  6 months of total therapy.  Plan: 1. Continue IV vancomycin through  October 16 2. Start ciprofloxacin 750 mg twice daily along with rifampin 300 mg twice daily through October 16 3. I will arrange a followup in my clinic within the next 2 weeks  Cliffton Asters, MD [facility] [phone] pager   [phone] cell 10/11/2012, 9:44 AM

## 2012-10-11 NOTE — Progress Notes (Signed)
Physical Therapy Treatment Patient Details Name: [name] MRN: [id number] DOB: 30-Nov-1931 Today's Date: 10/11/2012 Time: 0923-1001 PT Time Calculation (min): 38 min  PT Assessment / Plan / Recommendation  History of Present Illness This 79 y.o. male admitted with acute septic arthritis Lt. Knee. Underwent IRRIGATION AND DEBRIDEMENT LEFT KNEE WITH POLY REVISION (Left)   PT Comments   Pt instructed in safety on steps, pt to use KI for getting into house. Written program given for there Exer. Will need to instruct pt's wife in steps. Plans for Dc today.  Follow Up Recommendations  Home health PT     Does the patient have the potential to tolerate intense rehabilitation     Barriers to Discharge        Equipment Recommendations  None recommended by PT    Recommendations for Other Services    Frequency 7X/week   Progress towards PT Goals Progress towards PT goals: Progressing toward goals  Plan Current plan remains appropriate    Precautions / Restrictions Precautions Precautions: Fall;Knee Required Braces or Orthoses: Knee Immobilizer - Left   Pertinent Vitals/Pain Relates L knee is sore.    Mobility  Bed Mobility Supine to Sit: 4: Min assist;HOB elevated;With rails Sitting - Scoot to Edge of Bed: 4: Min assist Details for Bed Mobility Assistance: Pt with difficulty lifting shoulders off bed and with hand placement,  supported LLE for lowering to the ground. Transfers Sit to Stand: 5: Supervision;From bed;From toilet;With upper extremity assist Stand to Sit: With armrests;To toilet;To chair/3-in-1;With upper extremity assist;5: Supervision Details for Transfer Assistance: Pt was able to lower to low toilet using rail Ambulation/Gait Ambulation/Gait Assistance: 5: Supervision Ambulation Distance (Feet): 300 Feet Assistive device: Rolling walker Ambulation/Gait Assistance Details: cues for safety and for sequence. Reminder to use the RW, not walk away Gait Pattern:  Step-through pattern;Antalgic Stairs: Yes Stairs Assistance: 4: Min assist Stairs Assistance Details (indicate cue type and reason): instructed on where to hold RW, sequence, Stair Management Technique: No rails;Backwards;With walker Number of Stairs: 2    Exercises Total Joint Exercises Quad Sets: AROM;Left;10 reps;Seated Short Arc Quad: AAROM;Left;10 reps;Seated Heel Slides: AAROM;Left;10 reps;Seated Straight Leg Raises: AAROM;Left;10 reps;Supine Goniometric ROM: 10-40  L knee   PT Diagnosis:    PT Problem List:   PT Treatment Interventions:     PT Goals (current goals can now be found in the care plan section)    Visit Information  Last PT Received On: 10/11/12 Assistance Needed: +1 History of Present Illness: This 79 y.o. male admitted with acute septic arthritis Lt. Knee. Underwent IRRIGATION AND DEBRIDEMENT LEFT KNEE WITH POLY REVISION (Left)    Subjective Data      Cognition  Cognition Arousal/Alertness: Awake/alert    Balance     End of Session PT - End of Session Equipment Utilized During Treatment: Gait belt Activity Tolerance: Patient tolerated treatment well Patient left: in chair;with call bell/phone within reach Nurse Communication: Mobility status   GP     Rada Hay 10/11/2012, 11:10 AM

## 2012-10-11 NOTE — Progress Notes (Signed)
ANTICOAGULATION CONSULT NOTE - Follow Up Consult  Pharmacy Consult for Warfarin Indication: Hx DVT, post op VTE prophylaxis  Allergies  Allergen Reactions  . Darifenacin Hydrobromide Er Nausea Only    Dizziness    Patient Measurements: Height: 6' (182.9 cm) Weight: 240 lb (108.863 kg) IBW/kg (Calculated) : 77.6  Vital Signs: Temp: 98.2 F (36.8 C) (09/05 0549) Temp src: Oral (09/05 0549) BP: 146/68 mmHg (09/05 0549) Pulse Rate: 65 (09/05 0549)  Labs:  Recent Labs  10/09/12 1520 10/10/12 0511 10/11/12 0435  HGB 14.0 12.3* 12.3*  HCT 40.4 37.3* 36.0*  PLT 219 177 212  APTT 32  --   --   LABPROT 16.9* 16.9* 19.8*  INR 1.41 1.41 1.74*  CREATININE 1.40* 1.34 1.26    Estimated Creatinine Clearance: 60.6 ml/min (by C-G formula based on Cr of 1.26).   Medications:  Scheduled:  . aspirin EC  81 mg Oral Daily  . docusate sodium  100 mg Oral BID  . enoxaparin (LOVENOX) injection  30 mg Subcutaneous Q12H  . fenofibrate  160 mg Oral Daily  . pantoprazole  80 mg Oral Daily  . vancomycin  1,500 mg Intravenous Q24H  . Warfarin - Pharmacist Dosing Inpatient   Does not apply q1800   Infusions:  . sodium chloride 20 mL/hr at 10/10/12 0700    Assessment: 81 YOM s/p L knee arthrotomy for septic arthritis of knee. He was taking warfarin prior to holding for surgery for h/o DVT. He does have IVC filter.  Home warfarin dosing: per coumadin clinic visit 5mg  on TuSa and 2.5 mg on all other days. Home aspirin 81mg  daily resumed.  On Lovenox 30mg  sq q12h until INR >/= 1.8  Warfarin 5mg , 2.5mg  given 9/3-9/4  INR rising, 1.74, almost therapeutic  No bleeding reported.  Goal of Therapy:  INR 2-3 Monitor platelets by anticoagulation protocol: Yes   Plan:   Warfarin 2.5mg  po today at 18:00  Continue lovenox 30mg  sq q12h until INR > or = 1.8  Daily PT/INR  Gwen Her PharmD  [phone] 10/11/2012 7:08 AM

## 2012-10-11 NOTE — Progress Notes (Signed)
Physical Therapy Treatment Patient Details Name: [name] MRN: [id number] DOB: 1931-08-24 Today's Date: 10/11/2012 Time: 1045-1100 PT Time Calculation (min): 15 min  PT Assessment / Plan / Recommendation  History of Present Illness This 79 y.o. male admitted with acute septic arthritis Lt. Knee. Underwent IRRIGATION AND DEBRIDEMENT LEFT KNEE WITH POLY REVISION (Left)   PT Comments   Wife instructed on steps. Pt plans DC to home.  Follow Up Recommendations  Home health PT     Does the patient have the potential to tolerate intense rehabilitation     Barriers to Discharge        Equipment Recommendations  None recommended by PT    Recommendations for Other Services    Frequency 7X/week   Progress towards PT Goals Progress towards PT goals: Progressing toward goals  Plan Current plan remains appropriate    Precautions / Restrictions Precautions Precautions: Fall;Knee Required Braces or Orthoses: Knee Immobilizer - Left   Pertinent Vitals/Pain No c/o    Mobility   Stairs: Yes Stairs Assistance: 4: Min assist Stairs Assistance Details (indicate cue type and reason): wife instructed in how pt wil negotiate steps with RW going up backwards. wife demostrates proper security of RW.  Stair Management Technique: No rails;Backwards;With walker Number of Stairs: 2    Exercises Total Joint Exercises Quad Sets: AROM;Left;10 reps;Seated Short Arc Quad: AAROM;Left;10 reps;Seated Heel Slides: AAROM;Left;10 reps;Seated Straight Leg Raises: AAROM;Left;10 reps;Supine Goniometric ROM: 10-40  L knee Wife instructed on how she can assist pt with exercises. All questions answered.   PT Diagnosis:    PT Problem List:   PT Treatment Interventions:     PT Goals (current goals can now be found in the care plan section)    Visit Information  Last PT Received On: 10/11/12 Assistance Needed: +1 History of Present Illness: This 79 y.o. male admitted with acute septic arthritis Lt.  Knee. Underwent IRRIGATION AND DEBRIDEMENT LEFT KNEE WITH POLY REVISION (Left)    Subjective Data      Cognition  Cognition Arousal/Alertness: Awake/alert    Balance     End of Session PT - End of Session Equipment Utilized During Treatment: Gait belt Activity Tolerance: Patient tolerated treatment well Patient left: in chair;with call bell/phone within reach Nurse Communication:  (all instruction is complete.)   GP     Rada Hay 10/11/2012, 11:28 AM

## 2012-10-11 NOTE — Progress Notes (Signed)
Subjective: 2 Days Post-Op Procedure(s) (LRB): LEFT KNEE ARTHROTOMY (Left) IRRIGATION AND DEBRIDEMENT LEFT KNEE WITH POLY REVISION (Left) Patient reports pain as mild.   Patient seen in rounds with Dr. Lequita Halt. Patient is well, and has had no acute complaints or problems. He reports that he is feeling very well this morning. He was able to get some rest last night. He denies SOB and chest pain.  Plan is to go Home after hospital stay.  Objective: Vital signs in last 24 hours: Temp:  [97.3 F (36.3 C)-99.3 F (37.4 C)] 98.2 F (36.8 C) (09/05 0549) Pulse Rate:  [65-68] 65 (09/05 0549) Resp:  [16-20] 16 (09/05 0549) BP: (146-157)/(68-81) 146/68 mmHg (09/05 0549) SpO2:  [94 %-98 %] 96 % (09/05 0549)  Intake/Output from previous day:  Intake/Output Summary (Last 24 hours) at 10/11/12 0814 Last data filed at 10/11/12 0600  Gross per 24 hour  Intake   1335 ml  Output   2050 ml  Net   -715 ml     Labs:  Recent Labs  10/09/12 1520 10/10/12 0511 10/11/12 0435  HGB 14.0 12.3* 12.3*    Recent Labs  10/10/12 0511 10/11/12 0435  WBC 6.2 9.3  RBC 4.45 4.36  HCT 37.3* 36.0*  PLT 177 212    Recent Labs  10/10/12 0511 10/11/12 0435  NA 137 135  K 3.8 3.8  CL 106 104  CO2 25 25  BUN 27* 24*  CREATININE 1.34 1.26  GLUCOSE 132* 173*  CALCIUM 8.7 9.5    Recent Labs  10/10/12 0511 10/11/12 0435  INR 1.41 1.74*    EXAM General - Patient is Alert and Oriented Extremity - Neurologically intact Neurovascular intact Dorsiflexion/Plantar flexion intact Dressing/Incision - clean, dry, no drainage Motor Function - intact, moving foot and toes well on exam.   Past Medical History  Diagnosis Date  . CAD, NATIVE VESSEL     PCI and DES to circumflex  . DVT 2006    on coumadin x1 year, IVC filter placed  . Mixed hyperlipidemia   . PROSTATE CANCER   . GERD   . Prostate cancer     s/p mutliple urologic procedures  . Pulmonary embolus 2013    LIFETIME  ANTICOAGULATION BECAUSE OF PE AND PAST HX OF DVT  . MI (myocardial infarction) 08/25/09  . HYPERTENSION     PT DENIES HAVING HYPERTENSION  . Urinary incontinence     MULTIPLE BLADDER SURGERIES - STATES NO URINARY SPHINCTER - PT'S UROLOGIST IS AT [facility]- DR. PETERSON  ( LAST VISIT WAS 09/15/11 )    Assessment/Plan: 2 Days Post-Op Procedure(s) (LRB): LEFT KNEE ARTHROTOMY (Left) IRRIGATION AND DEBRIDEMENT LEFT KNEE WITH POLY REVISION (Left) Principal Problem:   Infection of prosthetic left knee joint Active Problems:   PROSTATE CANCER   HYPERTENSION   CAD, NATIVE VESSEL   DVT   GERD   Mixed hyperlipidemia   Overweight(278.02)   Pulmonary embolism   Long term (current) use of anticoagulants  Estimated body mass index is 32.54 kg/(m^2) as calculated from the following:   Height as of this encounter: 6' (1.829 m).   Weight as of this encounter: 108.863 kg (240 lb). Advance diet Up with therapy D/C IV fluids Continue ABX therapy due to infected left TKA  DVT Prophylaxis - Lovenox and Coumadin Weight-Bearing as tolerated   He is doing better this morning. Will continue PT. Hopeful for DC home this afternoon but will have to wait on final call from ID about  antibiotic coverage. DC foley this afternoon as well.   Bertin Inabinet LAUREN 10/11/2012, 8:14 AM

## 2012-10-12 DIAGNOSIS — I10 Essential (primary) hypertension: Secondary | ICD-10-CM | POA: Diagnosis not present

## 2012-10-12 DIAGNOSIS — Z4789 Encounter for other orthopedic aftercare: Secondary | ICD-10-CM | POA: Diagnosis not present

## 2012-10-12 DIAGNOSIS — I2510 Atherosclerotic heart disease of native coronary artery without angina pectoris: Secondary | ICD-10-CM | POA: Diagnosis not present

## 2012-10-12 DIAGNOSIS — Z452 Encounter for adjustment and management of vascular access device: Secondary | ICD-10-CM | POA: Diagnosis not present

## 2012-10-12 DIAGNOSIS — Z96659 Presence of unspecified artificial knee joint: Secondary | ICD-10-CM | POA: Diagnosis not present

## 2012-10-13 DIAGNOSIS — I10 Essential (primary) hypertension: Secondary | ICD-10-CM | POA: Diagnosis not present

## 2012-10-13 DIAGNOSIS — I2510 Atherosclerotic heart disease of native coronary artery without angina pectoris: Secondary | ICD-10-CM | POA: Diagnosis not present

## 2012-10-13 DIAGNOSIS — Z4789 Encounter for other orthopedic aftercare: Secondary | ICD-10-CM | POA: Diagnosis not present

## 2012-10-13 DIAGNOSIS — Z452 Encounter for adjustment and management of vascular access device: Secondary | ICD-10-CM | POA: Diagnosis not present

## 2012-10-13 DIAGNOSIS — Z96659 Presence of unspecified artificial knee joint: Secondary | ICD-10-CM | POA: Diagnosis not present

## 2012-10-14 DIAGNOSIS — Z4789 Encounter for other orthopedic aftercare: Secondary | ICD-10-CM | POA: Diagnosis not present

## 2012-10-14 DIAGNOSIS — Z96659 Presence of unspecified artificial knee joint: Secondary | ICD-10-CM | POA: Diagnosis not present

## 2012-10-14 DIAGNOSIS — I10 Essential (primary) hypertension: Secondary | ICD-10-CM | POA: Diagnosis not present

## 2012-10-14 DIAGNOSIS — Z452 Encounter for adjustment and management of vascular access device: Secondary | ICD-10-CM | POA: Diagnosis not present

## 2012-10-14 DIAGNOSIS — I2510 Atherosclerotic heart disease of native coronary artery without angina pectoris: Secondary | ICD-10-CM | POA: Diagnosis not present

## 2012-10-14 LAB — ANAEROBIC CULTURE

## 2012-10-14 NOTE — Discharge Summary (Signed)
Physician Discharge Summary   Patient ID: [name] MRN: [id number] DOB/AGE: 1931/05/19 79 y.o.  Admit date: 10/09/2012 Discharge date: 10/11/2012  Primary Diagnosis: Left total knee, infection  Admission Diagnoses:  Past Medical History  Diagnosis Date  . CAD, NATIVE VESSEL     PCI and DES to circumflex  . DVT 2006    on coumadin x1 year, IVC filter placed  . Mixed hyperlipidemia   . PROSTATE CANCER   . GERD   . Prostate cancer     s/p mutliple urologic procedures  . Pulmonary embolus 2013    LIFETIME ANTICOAGULATION BECAUSE OF PE AND PAST HX OF DVT  . MI (myocardial infarction) 08/25/09  . HYPERTENSION     PT DENIES HAVING HYPERTENSION  . Urinary incontinence     MULTIPLE BLADDER SURGERIES - STATES NO URINARY SPHINCTER - PT'S UROLOGIST IS AT [facility]- DR. PETERSON  ( LAST VISIT WAS 09/15/11 )   Discharge Diagnoses:   Principal Problem:   Infection of prosthetic left knee joint Active Problems:   PROSTATE CANCER   HYPERTENSION   CAD, NATIVE VESSEL   DVT   GERD   Mixed hyperlipidemia   Overweight(278.02)   Pulmonary embolism   Long term (current) use of anticoagulants  Estimated body mass index is 32.54 kg/(m^2) as calculated from the following:   Height as of this encounter: 6' (1.829 m).   Weight as of this encounter: 108.863 kg (240 lb).  Procedure:  Procedure(s) (LRB): LEFT KNEE ARTHROTOMY (Left) IRRIGATION AND DEBRIDEMENT LEFT KNEE WITH POLY REVISION (Left)   Consults: ID  HPI: [name] was doing fine with no problems related to his knees until approximately 4 days ago. He was working outside in the heat and came inside to take a nap. When he woke up his knee was warm and swollen with inability to bear weight. His knee was asymptomatic prior to the acute onset of this pain and swelling. He denies any fever or chills. He went to urgent care the next day and was felt to have a possible infection. He was started on oral antibiotics and has felt a little  better.   Laboratory Data: Admission on 10/09/2012, Discharged on 10/11/2012  Component Date Value Range Status  . ABO/RH(D) 10/09/2012 A POS   Final  . Antibody Screen 10/09/2012 NEG   Final  . Sample Expiration 10/09/2012 10/12/2012   Final  . aPTT 10/09/2012 32  24 - 37 seconds Final  . WBC 10/09/2012 7.7  4.0 - 10.5 K/uL Final  . RBC 10/09/2012 4.91  4.22 - 5.81 MIL/uL Final  . Hemoglobin 10/09/2012 14.0  13.0 - 17.0 g/dL Final  . HCT 14/78/2956 40.4  39.0 - 52.0 % Final  . MCV 10/09/2012 82.3  78.0 - 100.0 fL Final  . MCH 10/09/2012 28.5  26.0 - 34.0 pg Final  . MCHC 10/09/2012 34.7  30.0 - 36.0 g/dL Final  . RDW 21/30/8657 14.9  11.5 - 15.5 % Final  . Platelets 10/09/2012 219  150 - 400 K/uL Final  . Sodium 10/09/2012 137  135 - 145 mEq/L Final  . Potassium 10/09/2012 4.0  3.5 - 5.1 mEq/L Final  . Chloride 10/09/2012 106  96 - 112 mEq/L Final  . CO2 10/09/2012 22  19 - 32 mEq/L Final  . Glucose, Bld 10/09/2012 103* 70 - 99 mg/dL Final  . BUN 84/69/6295 28* 6 - 23 mg/dL Final  . Creatinine, Ser 10/09/2012 1.40* 0.50 - 1.35 mg/dL Final  . Calcium  10/09/2012 9.3  8.4 - 10.5 mg/dL Final  . Total Protein 10/09/2012 6.5  6.0 - 8.3 g/dL Final  . Albumin 29/52/8413 2.8* 3.5 - 5.2 g/dL Final  . AST 24/40/1027 15  0 - 37 U/L Final  . ALT 10/09/2012 8  0 - 53 U/L Final  . Alkaline Phosphatase 10/09/2012 52  39 - 117 U/L Final  . Total Bilirubin 10/09/2012 0.9  0.3 - 1.2 mg/dL Final  . GFR calc non Af Amer 10/09/2012 46* >90 mL/min Final  . GFR calc Af Amer 10/09/2012 54* >90 mL/min Final   Comment: (NOTE)                          The eGFR has been calculated using the CKD EPI equation.                          This calculation has not been validated in all clinical situations.                          eGFR's persistently <90 mL/min signify possible Chronic Kidney                          Disease.  Marland Kitchen Prothrombin Time 10/09/2012 16.9* 11.6 - 15.2 seconds Final  . INR 10/09/2012 1.41   0.00 - 1.49 Final  . Specimen Description 10/09/2012 URINE, RANDOM   Final  . Special Requests 10/09/2012 URINE, CATHETERIZED   Final  . Culture  Setup Time 10/09/2012    Final                   Value:10/10/2012 01:58                         Performed at [facility]  . Colony Count 10/09/2012    Final                   Value:NO GROWTH                         Performed at [facility]  . Culture 10/09/2012    Final                   Value:NO GROWTH                         Performed at [facility]  . Report Status 10/09/2012 10/11/2012 FINAL   Final  . Specimen Description 10/09/2012 SYNOVIAL   Final  . Special Requests 10/09/2012 LEFT KNEE   Final  . Gram Stain 10/09/2012    Final                   Value:FEW WBC PRESENT,BOTH PMN AND MONONUCLEAR                         NO ORGANISMS SEEN                         Performed at [facility]  . Culture 10/09/2012    Final                   Value:NO GROWTH 2 DAYS  Performed at [facility]  . Report Status 10/09/2012 10/12/2012 FINAL   Final  . Specimen Description 10/09/2012 SYNOVIAL   Final  . Special Requests 10/09/2012 LEFT KNEE   Final  . Gram Stain 10/09/2012    Final                   Value:FEW WBC PRESENT,BOTH PMN AND MONONUCLEAR                         NO ORGANISMS SEEN                         Performed at [facility]  . Culture 10/09/2012    Final                   Value:NO ANAEROBES ISOLATED; CULTURE IN PROGRESS FOR 5 DAYS                         Performed at [facility]  . Report Status 10/09/2012 PENDING   Incomplete  . WBC 10/10/2012 6.2  4.0 - 10.5 K/uL Final  . RBC 10/10/2012 4.45  4.22 - 5.81 MIL/uL Final  . Hemoglobin 10/10/2012 12.3* 13.0 - 17.0 g/dL Final  . HCT 16/11/9602 37.3* 39.0 - 52.0 % Final  . MCV 10/10/2012 83.8  78.0 - 100.0 fL Final  . MCH 10/10/2012 27.6  26.0 - 34.0 pg Final  . MCHC 10/10/2012 33.0  30.0 - 36.0 g/dL Final    . RDW 54/10/8117 15.2  11.5 - 15.5 % Final  . Platelets 10/10/2012 177  150 - 400 K/uL Final  . Sodium 10/10/2012 137  135 - 145 mEq/L Final  . Potassium 10/10/2012 3.8  3.5 - 5.1 mEq/L Final  . Chloride 10/10/2012 106  96 - 112 mEq/L Final  . CO2 10/10/2012 25  19 - 32 mEq/L Final  . Glucose, Bld 10/10/2012 132* 70 - 99 mg/dL Final  . BUN 14/78/2956 27* 6 - 23 mg/dL Final  . Creatinine, Ser 10/10/2012 1.34  0.50 - 1.35 mg/dL Final  . Calcium 21/30/8657 8.7  8.4 - 10.5 mg/dL Final  . GFR calc non Af Amer 10/10/2012 49* >90 mL/min Final  . GFR calc Af Amer 10/10/2012 56* >90 mL/min Final   Comment: (NOTE)                          The eGFR has been calculated using the CKD EPI equation.                          This calculation has not been validated in all clinical situations.                          eGFR's persistently <90 mL/min signify possible Chronic Kidney                          Disease.  Marland Kitchen Prothrombin Time 10/10/2012 16.9* 11.6 - 15.2 seconds Final  . INR 10/10/2012 1.41  0.00 - 1.49 Final  . WBC 10/11/2012 9.3  4.0 - 10.5 K/uL Final  . RBC 10/11/2012 4.36  4.22 - 5.81 MIL/uL Final  . Hemoglobin 10/11/2012 12.3* 13.0 - 17.0 g/dL Final  . HCT 84/69/6295 36.0* 39.0 - 52.0 %  Final  . MCV 10/11/2012 82.6  78.0 - 100.0 fL Final  . MCH 10/11/2012 28.2  26.0 - 34.0 pg Final  . MCHC 10/11/2012 34.2  30.0 - 36.0 g/dL Final  . RDW 65/78/4696 14.6  11.5 - 15.5 % Final  . Platelets 10/11/2012 212  150 - 400 K/uL Final  . Sodium 10/11/2012 135  135 - 145 mEq/L Final  . Potassium 10/11/2012 3.8  3.5 - 5.1 mEq/L Final  . Chloride 10/11/2012 104  96 - 112 mEq/L Final  . CO2 10/11/2012 25  19 - 32 mEq/L Final  . Glucose, Bld 10/11/2012 173* 70 - 99 mg/dL Final  . BUN 29/52/8413 24* 6 - 23 mg/dL Final  . Creatinine, Ser 10/11/2012 1.26  0.50 - 1.35 mg/dL Final  . Calcium 24/40/1027 9.5  8.4 - 10.5 mg/dL Final  . GFR calc non Af Amer 10/11/2012 52* >90 mL/min Final  . GFR calc Af Amer  10/11/2012 61* >90 mL/min Final   Comment: (NOTE)                          The eGFR has been calculated using the CKD EPI equation.                          This calculation has not been validated in all clinical situations.                          eGFR's persistently <90 mL/min signify possible Chronic Kidney                          Disease.  Marland Kitchen Prothrombin Time 10/11/2012 19.8* 11.6 - 15.2 seconds Final  . INR 10/11/2012 1.74* 0.00 - 1.49 Final  . Sed Rate 10/11/2012 46* 0 - 16 mm/hr Final  . CRP 10/11/2012 5.3* <0.60 mg/dL Final   Performed at [facility]  Anti-coag visit on 09/06/2012  Component Date Value Range Status  . INR 09/06/2012 2.4   Final     X-Rays:Dg Chest 2 View  10/09/2012   *RADIOLOGY REPORT*  Clinical Data: Preoperative respiratory exam.  Acute septic arthritis of the left knee.  CHEST - 2 VIEW  Comparison: Chest x-ray dated 09/28/2011  Findings: The heart size and pulmonary vascularity are normal and the lungs are clear.  No acute osseous abnormality.  Arthritis at both shoulders.  Surgical clips in the left axilla.  IMPRESSION: No acute abnormality.   Original Report Authenticated By: Francene Boyers, M.D.    EKG: Orders placed in visit on 06/26/12  . EXERCISE TOLERANCE TEST     Hospital Course: Phong P Dragoo is a 79 y.o. who was admitted to [facility]. They were brought to the operating room on 10/09/2012 and underwent Procedure(s): LEFT KNEE ARTHROTOMY IRRIGATION AND DEBRIDEMENT LEFT KNEE WITH POLY REVISION.  Patient tolerated the procedure well and was later transferred to the recovery room and then to the orthopaedic floor for postoperative care.  They were given PO and IV analgesics for pain control following their surgery.  They were given 24 hours of postoperative antibiotics of  Anti-infectives   Start     Dose/Rate Route Frequency Ordered Stop   10/11/12 1800  vancomycin (VANCOCIN) 1,500 mg in sodium chloride 0.9 % 500 mL IVPB  Status:   Discontinued     1,500 mg 250 mL/hr  over 120 Minutes Intravenous Every 24 hours 10/10/12 1648 10/11/12 2228   10/11/12 1100  ciprofloxacin (CIPRO) tablet 750 mg  Status:  Discontinued     750 mg Oral 2 times daily 10/11/12 0954 10/11/12 2228   10/11/12 1100  rifampin (RIFADIN) capsule 300 mg  Status:  Discontinued     300 mg Oral Every 12 hours 10/11/12 0954 10/11/12 2228   10/11/12 0000  sodium chloride 0.9 % SOLN 500 mL with vancomycin 10 G SOLR 1,500 mg    Comments:  Patient to be on for 6 weeks   1,500 mg 250 mL/hr over 120 Minutes Intravenous Every 24 hours 10/11/12 0854     10/11/12 0000  ciprofloxacin (CIPRO) 750 MG tablet     750 mg Oral 2 times daily 10/11/12 1220     10/11/12 0000  rifampin (RIFADIN) 300 MG capsule     300 mg Oral Every 12 hours 10/11/12 1220     10/10/12 1800  vancomycin (VANCOCIN) 2,000 mg in sodium chloride 0.9 % 500 mL IVPB     2,000 mg 250 mL/hr over 120 Minutes Intravenous  Once 10/10/12 1648 10/10/12 1934   10/10/12 0600  ceFAZolin (ANCEF) 3 g in dextrose 5 % 50 mL IVPB  Status:  Discontinued     3 g 160 mL/hr over 30 Minutes Intravenous On call to O.R. 10/09/12 1445 10/09/12 1450   10/10/12 0000  ceFAZolin (ANCEF) IVPB 2 g/50 mL premix  Status:  Discontinued     2 g 100 mL/hr over 30 Minutes Intravenous 4 times per day 10/09/12 2042 10/10/12 1630   10/09/12 1500  ceFAZolin (ANCEF) 3 g in dextrose 5 % 50 mL IVPB     3 g 160 mL/hr over 30 Minutes Intravenous On call to O.R. 10/09/12 1445 10/09/12 1725    Discharge planning consulted to help with postop disposition and equipment needs.  Patient had a decent night on the evening of surgery.  They started to get up OOB with therapy on day one. Hemovac drain was pulled without difficulty.  Continued to work with therapy into day two.  Dressing was changed on day two and the incision was clean and dry. ID consulted on the patient in order to get him on the proper antibiotic coverage. Patient was seen in rounds  and was ready to go home.   Discharge Medications: Prior to Admission medications   Medication Sig Start Date End Date Taking? Authorizing Provider  Cyanocobalamin (B-12) 1000 MCG CAPS Take 1 tablet by mouth daily.    Yes Historical Provider, MD  Diphenhydramine-APAP, sleep, (EXCEDRIN PM PO) Take 1 tablet by mouth at bedtime as needed (pain/sleep).    Yes Historical Provider, MD  esomeprazole (NEXIUM) 40 MG capsule Take 40 mg by mouth daily before breakfast.    Yes Historical Provider, MD  fenofibrate 160 MG tablet Take 160 mg by mouth daily.   Yes Historical Provider, MD  Misc Natural Products (GLUCOSAMINE CHONDROITIN ADV PO) Take 1 tablet by mouth daily.    Yes Historical Provider, MD  Multiple Vitamin (MULTIVITAMIN) capsule Take 1 capsule by mouth daily.    Yes Historical Provider, MD  nitroGLYCERIN (NITROSTAT) 0.4 MG SL tablet Place 1 tablet (0.4 mg total) under the tongue every 5 (five) minutes as needed. 12/28/11  Yes Rollene Rotunda, MD  Omega-3 Fatty Acids (FISH OIL CONCENTRATE PO) Take 2 capsules by mouth daily.    Yes Historical Provider, MD  OVER THE COUNTER MEDICATION Take 1 capsule by mouth  daily. STOOL SOFTNER DAILY   Yes Historical Provider, MD  pravastatin (PRAVACHOL) 20 MG tablet Take 20 mg by mouth daily.  05/18/12  Yes Rollene Rotunda, MD  ciprofloxacin (CIPRO) 750 MG tablet Take 1 tablet (750 mg total) by mouth 2 (two) times daily. 10/11/12   Fathima Bartl Tamala Ser, PA-C  enoxaparin (LOVENOX) 30 MG/0.3ML injection Inject 0.3 mLs (30 mg total) into the skin every 12 (twelve) hours. 10/11/12   Valaree Fresquez Tamala Ser, PA-C  methocarbamol (ROBAXIN) 500 MG tablet Take 1 tablet (500 mg total) by mouth every 6 (six) hours as needed. 10/11/12   Billy Turvey Tamala Ser, PA-C  oxyCODONE (OXY IR/ROXICODONE) 5 MG immediate release tablet Take 1-2 tablets (5-10 mg total) by mouth every 3 (three) hours as needed. 10/11/12   Elisha Cooksey Tamala Ser, PA-C  rifampin (RIFADIN) 300 MG capsule Take 1 capsule  (300 mg total) by mouth every 12 (twelve) hours. 10/11/12   Kaleab Frasier Tamala Ser, PA-C  sodium chloride 0.9 % SOLN 500 mL with vancomycin 10 G SOLR 1,500 mg Inject 1,500 mg into the vein daily. 10/11/12   Sadye Kiernan Tamala Ser, PA-C  traMADol (ULTRAM) 50 MG tablet Take 1-2 tablets (50-100 mg total) by mouth every 6 (six) hours as needed. 10/11/12   Jann Ra Tamala Ser, PA-C  warfarin (COUMADIN) 2.5 MG tablet Take 1 tablet (2.5 mg total) by mouth daily. 10/11/12   Khayri Kargbo Tamala Ser, PA-C    Diet: Regular diet Activity:WBAT Follow-up:in 1 week Disposition - Home Discharged Condition: stable   Discharge Orders   Future Appointments Provider Department Dept Phone   10/18/2012 9:45 AM [facility] [phone]   10/23/2012 9:45 AM Cliffton Asters, MD [facility] [phone]   12/30/2012 9:00 AM Rollene Rotunda, MD [facility] Main Office [place]) [phone]   Future Orders Complete By Expires   Call MD / Call 911  As directed    Comments:     If you experience chest pain or shortness of breath, CALL 911 and be transported to the hospital emergency room.  If you develope a fever above 101 F, pus (white drainage) or increased drainage or redness at the wound, or calf pain, call your surgeon's office.   Constipation Prevention  As directed    Comments:     Drink plenty of fluids.  Prune juice may be helpful.  You may use a stool softener, such as Colace (over the counter) 100 mg twice a day.  Use MiraLax (over the counter) for constipation as needed.   Diet - low sodium heart healthy  As directed    Discharge instructions  As directed    Comments:     Walk with your walker. Weight bearing as tolerated Home Health Agency will follow you at home for your therapy and to manage your Coumadin. Change your dressing daily. Shower only, no tub bath. Call if any temperatures greater than 101 or any wound  complications: [phone]  Follow up in one week   Driving restrictions  As directed    Comments:     No driving for 2 weeks   Increase activity slowly as tolerated  As directed        Medication List    STOP taking these medications       aspirin 81 MG tablet     cephALEXin 500 MG capsule  Commonly known as:  KEFLEX      TAKE these medications       B-12  1000 MCG Caps  Take 1 tablet by mouth daily.     ciprofloxacin 750 MG tablet  Commonly known as:  CIPRO  Take 1 tablet (750 mg total) by mouth 2 (two) times daily.     enoxaparin 30 MG/0.3ML injection  Commonly known as:  LOVENOX  Inject 0.3 mLs (30 mg total) into the skin every 12 (twelve) hours.     esomeprazole 40 MG capsule  Commonly known as:  NEXIUM  Take 40 mg by mouth daily before breakfast.     EXCEDRIN PM PO  Take 1 tablet by mouth at bedtime as needed (pain/sleep).     fenofibrate 160 MG tablet  Take 160 mg by mouth daily.     FISH OIL CONCENTRATE PO  Take 2 capsules by mouth daily.     GLUCOSAMINE CHONDROITIN ADV PO  Take 1 tablet by mouth daily.     methocarbamol 500 MG tablet  Commonly known as:  ROBAXIN  Take 1 tablet (500 mg total) by mouth every 6 (six) hours as needed.     multivitamin capsule  Take 1 capsule by mouth daily.     nitroGLYCERIN 0.4 MG SL tablet  Commonly known as:  NITROSTAT  Place 1 tablet (0.4 mg total) under the tongue every 5 (five) minutes as needed.     OVER THE COUNTER MEDICATION  Take 1 capsule by mouth daily. STOOL SOFTNER DAILY     oxyCODONE 5 MG immediate release tablet  Commonly known as:  Oxy IR/ROXICODONE  Take 1-2 tablets (5-10 mg total) by mouth every 3 (three) hours as needed.     pravastatin 20 MG tablet  Commonly known as:  PRAVACHOL  Take 20 mg by mouth daily.     rifampin 300 MG capsule  Commonly known as:  RIFADIN  Take 1 capsule (300 mg total) by mouth every 12 (twelve) hours.     sodium chloride 0.9 % SOLN 500 mL with vancomycin 10 G SOLR  1,500 mg  Inject 1,500 mg into the vein daily.     traMADol 50 MG tablet  Commonly known as:  ULTRAM  Take 1-2 tablets (50-100 mg total) by mouth every 6 (six) hours as needed.     warfarin 2.5 MG tablet  Commonly known as:  COUMADIN  Take 1 tablet (2.5 mg total) by mouth daily.           Follow-up Information   Follow up with Loanne Drilling, MD. Schedule an appointment as soon as possible for a visit in 1 week.   Specialty:  Orthopedic Surgery   Contact information:   [address] [place] [address] [phone]       Signed: Dimitri Ped [facility] 10/14/2012, 8:58 AM

## 2012-10-15 DIAGNOSIS — A5449 Gonococcal infection of other musculoskeletal tissue: Secondary | ICD-10-CM | POA: Diagnosis not present

## 2012-10-15 DIAGNOSIS — Z7901 Long term (current) use of anticoagulants: Secondary | ICD-10-CM | POA: Diagnosis not present

## 2012-10-15 DIAGNOSIS — Z452 Encounter for adjustment and management of vascular access device: Secondary | ICD-10-CM | POA: Diagnosis not present

## 2012-10-15 DIAGNOSIS — Z4789 Encounter for other orthopedic aftercare: Secondary | ICD-10-CM | POA: Diagnosis not present

## 2012-10-15 DIAGNOSIS — Z96659 Presence of unspecified artificial knee joint: Secondary | ICD-10-CM | POA: Diagnosis not present

## 2012-10-15 DIAGNOSIS — I2782 Chronic pulmonary embolism: Secondary | ICD-10-CM | POA: Diagnosis not present

## 2012-10-15 DIAGNOSIS — I10 Essential (primary) hypertension: Secondary | ICD-10-CM | POA: Diagnosis not present

## 2012-10-15 DIAGNOSIS — I2510 Atherosclerotic heart disease of native coronary artery without angina pectoris: Secondary | ICD-10-CM | POA: Diagnosis not present

## 2012-10-16 DIAGNOSIS — I10 Essential (primary) hypertension: Secondary | ICD-10-CM | POA: Diagnosis not present

## 2012-10-16 DIAGNOSIS — Z4789 Encounter for other orthopedic aftercare: Secondary | ICD-10-CM | POA: Diagnosis not present

## 2012-10-16 DIAGNOSIS — I2510 Atherosclerotic heart disease of native coronary artery without angina pectoris: Secondary | ICD-10-CM | POA: Diagnosis not present

## 2012-10-16 DIAGNOSIS — Z96659 Presence of unspecified artificial knee joint: Secondary | ICD-10-CM | POA: Diagnosis not present

## 2012-10-16 DIAGNOSIS — Z452 Encounter for adjustment and management of vascular access device: Secondary | ICD-10-CM | POA: Diagnosis not present

## 2012-10-17 DIAGNOSIS — I2510 Atherosclerotic heart disease of native coronary artery without angina pectoris: Secondary | ICD-10-CM | POA: Diagnosis not present

## 2012-10-17 DIAGNOSIS — Z96659 Presence of unspecified artificial knee joint: Secondary | ICD-10-CM | POA: Diagnosis not present

## 2012-10-17 DIAGNOSIS — I10 Essential (primary) hypertension: Secondary | ICD-10-CM | POA: Diagnosis not present

## 2012-10-17 DIAGNOSIS — Z4789 Encounter for other orthopedic aftercare: Secondary | ICD-10-CM | POA: Diagnosis not present

## 2012-10-17 DIAGNOSIS — Z452 Encounter for adjustment and management of vascular access device: Secondary | ICD-10-CM | POA: Diagnosis not present

## 2012-10-18 DIAGNOSIS — Z452 Encounter for adjustment and management of vascular access device: Secondary | ICD-10-CM | POA: Diagnosis not present

## 2012-10-18 DIAGNOSIS — I2510 Atherosclerotic heart disease of native coronary artery without angina pectoris: Secondary | ICD-10-CM | POA: Diagnosis not present

## 2012-10-18 DIAGNOSIS — I10 Essential (primary) hypertension: Secondary | ICD-10-CM | POA: Diagnosis not present

## 2012-10-18 DIAGNOSIS — Z96659 Presence of unspecified artificial knee joint: Secondary | ICD-10-CM | POA: Diagnosis not present

## 2012-10-18 DIAGNOSIS — Z4789 Encounter for other orthopedic aftercare: Secondary | ICD-10-CM | POA: Diagnosis not present

## 2012-10-21 ENCOUNTER — Telehealth: Payer: Self-pay | Admitting: Internal Medicine

## 2012-10-21 DIAGNOSIS — I2510 Atherosclerotic heart disease of native coronary artery without angina pectoris: Secondary | ICD-10-CM | POA: Diagnosis not present

## 2012-10-21 DIAGNOSIS — Z4789 Encounter for other orthopedic aftercare: Secondary | ICD-10-CM | POA: Diagnosis not present

## 2012-10-21 DIAGNOSIS — Z452 Encounter for adjustment and management of vascular access device: Secondary | ICD-10-CM | POA: Diagnosis not present

## 2012-10-21 DIAGNOSIS — Z96659 Presence of unspecified artificial knee joint: Secondary | ICD-10-CM | POA: Diagnosis not present

## 2012-10-21 DIAGNOSIS — I10 Essential (primary) hypertension: Secondary | ICD-10-CM | POA: Diagnosis not present

## 2012-10-21 NOTE — Telephone Encounter (Signed)
I received a call from [employer]iva RN late Sunday afternoon reporting that [name] has had nightsweats x 3 nights, no fever when temp checked. Not occurring in the day time. I called the patient this morning who reports that he started to stagger his evening meds by where he takes rifampin at 8:30, white pill at 9 and then cholesterol meds at 9:30, he slept well without nightsweats. I instructed him to continue with staggering his evening meds, mark down on calendar if he has more nightsweats, any chills, check temp-- bring records to his appt with [name] on Wednesday.

## 2012-10-22 DIAGNOSIS — Z96659 Presence of unspecified artificial knee joint: Secondary | ICD-10-CM | POA: Diagnosis not present

## 2012-10-22 DIAGNOSIS — Z452 Encounter for adjustment and management of vascular access device: Secondary | ICD-10-CM | POA: Diagnosis not present

## 2012-10-22 DIAGNOSIS — I2510 Atherosclerotic heart disease of native coronary artery without angina pectoris: Secondary | ICD-10-CM | POA: Diagnosis not present

## 2012-10-22 DIAGNOSIS — Z4789 Encounter for other orthopedic aftercare: Secondary | ICD-10-CM | POA: Diagnosis not present

## 2012-10-22 DIAGNOSIS — I10 Essential (primary) hypertension: Secondary | ICD-10-CM | POA: Diagnosis not present

## 2012-10-23 ENCOUNTER — Ambulatory Visit (HOSPITAL_COMMUNITY)
Admission: RE | Admit: 2012-10-23 | Discharge: 2012-10-23 | Disposition: A | Discharge status: Home / Self Care | Payer: Medicare Other | Source: Ambulatory Visit | Attending: Internal Medicine | Admitting: Internal Medicine

## 2012-10-23 ENCOUNTER — Telehealth: Payer: Self-pay | Admitting: *Deleted

## 2012-10-23 ENCOUNTER — Ambulatory Visit (INDEPENDENT_AMBULATORY_CARE_PROVIDER_SITE_OTHER): Payer: Medicare Other | Admitting: Pharmacist

## 2012-10-23 ENCOUNTER — Encounter: Payer: Self-pay | Admitting: Internal Medicine

## 2012-10-23 ENCOUNTER — Ambulatory Visit (INDEPENDENT_AMBULATORY_CARE_PROVIDER_SITE_OTHER): Payer: Medicare Other | Admitting: Internal Medicine

## 2012-10-23 VITALS — BP 143/77 | HR 80 | Temp 97.9°F | Ht 72.0 in | Wt 236.0 lb

## 2012-10-23 DIAGNOSIS — M7989 Other specified soft tissue disorders: Secondary | ICD-10-CM | POA: Diagnosis not present

## 2012-10-23 DIAGNOSIS — C61 Malignant neoplasm of prostate: Secondary | ICD-10-CM | POA: Diagnosis not present

## 2012-10-23 DIAGNOSIS — T8454XD Infection and inflammatory reaction due to internal left knee prosthesis, subsequent encounter: Secondary | ICD-10-CM

## 2012-10-23 DIAGNOSIS — I2699 Other pulmonary embolism without acute cor pulmonale: Secondary | ICD-10-CM

## 2012-10-23 DIAGNOSIS — I82409 Acute embolism and thrombosis of unspecified deep veins of unspecified lower extremity: Secondary | ICD-10-CM | POA: Diagnosis not present

## 2012-10-23 DIAGNOSIS — Z5189 Encounter for other specified aftercare: Secondary | ICD-10-CM

## 2012-10-23 DIAGNOSIS — Z23 Encounter for immunization: Secondary | ICD-10-CM

## 2012-10-23 DIAGNOSIS — Z7901 Long term (current) use of anticoagulants: Secondary | ICD-10-CM

## 2012-10-23 LAB — C-REACTIVE PROTEIN: CRP: 3.3 mg/dL — ABNORMAL HIGH (ref <0.60)

## 2012-10-23 MED ORDER — ENOXAPARIN SODIUM 100 MG/ML ~~LOC~~ SOLN: 100.0000 mg | Freq: Two times a day (BID) | SUBCUTANEOUS | Status: DC

## 2012-10-23 MED ORDER — WARFARIN SODIUM 5 MG PO TABS: 5.0000 mg | ORAL_TABLET | Freq: Every day | ORAL | Status: DC

## 2012-10-23 NOTE — Progress Notes (Signed)
Right upper extremity venous duplex completed.  Right:  DVT noted in the subclavian and axillary veins.  Superficial thrombus noted in the basilic vein.  Left:  Negative for DVT in the subclavian vein.

## 2012-10-23 NOTE — Progress Notes (Signed)
Left message for patient with the results.

## 2012-10-23 NOTE — Telephone Encounter (Signed)
Left message stating that the results of RUE Doppler were positive for DVTs.  Per Dr. Blair Dolphin note, the patient will be contacted by the anticoagulation clinic to get him fully anticoagulated. If his PICC is still functioning well and his symptoms do not increase, he should keep the PICC line. If he has an increase in swelling or pain, the PICC will need to be pulled and replaced in the left arm.  Andree Coss, RN

## 2012-10-23 NOTE — Progress Notes (Addendum)
Patient ID: [name], male   DOB: [birth year], 79 y.o.   MRN: [id number]         [facility] for Infectious Disease  Patient Active Problem List   Diagnosis Date Noted  . Infection of prosthetic left knee joint 10/09/2012    Priority: High  . Long term (current) use of anticoagulants 10/06/2011  . Hypoxia 10/03/2011  . Pulmonary embolism 09/28/2011  . Overweight(278.02) 04/28/2011  . Preop cardiovascular exam 04/28/2011  . Mixed hyperlipidemia 02/24/2010  . CAD, NATIVE VESSEL 09/28/2009  . DIZZINESS 09/28/2009  . PROSTATE CANCER 09/24/2009  . HYPERTENSION 09/24/2009  . DVT 09/24/2009  . GERD 09/24/2009    Patient's Medications  New Prescriptions   No medications on file  Previous Medications   CIPROFLOXACIN (CIPRO) 750 MG TABLET    Take 1 tablet (750 mg total) by mouth 2 (two) times daily.   CYANOCOBALAMIN (B-12) 1000 MCG CAPS    Take 1 tablet by mouth daily.    DIPHENHYDRAMINE-APAP, SLEEP, (EXCEDRIN PM PO)    Take 1 tablet by mouth at bedtime as needed (pain/sleep).    ESOMEPRAZOLE (NEXIUM) 40 MG CAPSULE    Take 40 mg by mouth daily before breakfast.    FENOFIBRATE 160 MG TABLET    Take 160 mg by mouth daily.   METHOCARBAMOL (ROBAXIN) 500 MG TABLET    Take 1 tablet (500 mg total) by mouth every 6 (six) hours as needed.   MISC NATURAL PRODUCTS (GLUCOSAMINE CHONDROITIN ADV PO)    Take 1 tablet by mouth daily.    MULTIPLE VITAMIN (MULTIVITAMIN) CAPSULE    Take 1 capsule by mouth daily.    NITROGLYCERIN (NITROSTAT) 0.4 MG SL TABLET    Place 1 tablet (0.4 mg total) under the tongue every 5 (five) minutes as needed.   OMEGA-3 FATTY ACIDS (FISH OIL CONCENTRATE PO)    Take 2 capsules by mouth daily.    OVER THE COUNTER MEDICATION    Take 1 capsule by mouth daily. STOOL SOFTNER DAILY   OXYCODONE (OXY IR/ROXICODONE) 5 MG IMMEDIATE RELEASE TABLET    Take 1-2 tablets (5-10 mg total) by mouth every 3 (three) hours as needed.   PRAVASTATIN (PRAVACHOL) 20 MG TABLET    Take 20  mg by mouth daily.    RIFAMPIN (RIFADIN) 300 MG CAPSULE    Take 1 capsule (300 mg total) by mouth every 12 (twelve) hours.   TRAMADOL (ULTRAM) 50 MG TABLET    Take 1-2 tablets (50-100 mg total) by mouth every 6 (six) hours as needed.   WARFARIN (COUMADIN) 2.5 MG TABLET    Take 1 tablet (2.5 mg total) by mouth daily.  Modified Medications   Modified Medication Previous Medication   SODIUM CHLORIDE 0.9 % SOLN 500 ML WITH VANCOMYCIN 10 G SOLR sodium chloride 0.9 % SOLN 500 mL with vancomycin 10 G SOLR 1,500 mg      Inject 1,500 mg into the vein every 12 (twelve) hours.    Inject 1,500 mg into the vein daily.  Discontinued Medications   ENOXAPARIN (LOVENOX) 30 MG/0.3ML INJECTION    Inject 0.3 mLs (30 mg total) into the skin every 12 (twelve) hours.    Subjective: [name] is in for his hospital followup visit. He recently developed sudden pain and swelling of his left prosthetic knee. He was seen at an urgent care center at [facility] and started on oral cephalexin and then see him back by his orthopedic surgeon, Dr. Despina Hick. Knee aspirate from his  office showed no growth and no organisms on stain. He was admitted and underwent washout and poly-exchange. Operative cultures were also negative. 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 Over this past weekend he had 2 episodes of sweats without documented fever. He changed the timing of his oral rifampin and has not had any further problems since that time. He's had no problems with his PICC line abundant his wife became concerned because he seems to have a little bit of swelling of his right arm today. He says that his left knee is feeling much better. He is having much less pain and requiring less pain medication. Review of Systems: Pertinent items are noted in HPI.  Past Medical History  Diagnosis Date  . CAD, NATIVE VESSEL     PCI and DES to circumflex  .  DVT 2006    on coumadin x1 year, IVC filter placed  . Mixed hyperlipidemia   . PROSTATE CANCER   . GERD   . Prostate cancer     s/p mutliple urologic procedures  . Pulmonary embolus 2013    LIFETIME ANTICOAGULATION BECAUSE OF PE AND PAST HX OF DVT  . MI (myocardial infarction) 08/25/09  . HYPERTENSION     PT DENIES HAVING HYPERTENSION  . Urinary incontinence     MULTIPLE BLADDER SURGERIES - STATES NO URINARY SPHINCTER - PT'S UROLOGIST IS AT [facility]- DR. PETERSON  ( LAST VISIT WAS 09/15/11 )    History  Substance Use Topics  . Smoking status: Former Smoker    Quit date: 04/27/1961  . Smokeless tobacco: Not on file  . Alcohol Use: No    No family history on file.  Allergies  Allergen Reactions  . Darifenacin Hydrobromide Er Nausea Only    Dizziness    Objective: Temp: 97.9 F (36.6 C) (09/17 0944) Temp src: Oral (09/17 0944) BP: 143/77 mmHg (09/17 0944) Pulse Rate: 80 (09/17 0944)  General: He is up his spirits and in no distress Skin: Right arm PICC site appears normal. He does have mild swelling of his right forearm Lungs: Clear Cor: Regular S1 and S2 no murmurs Abdomen: Nontender Left knee: His incision is healing nicely and he has less swelling.  Lab Results  Component Value Date   CRP 5.3* 10/11/2012   Lab Results  Component Value Date   ESRSEDRATE 46* 10/11/2012     Assessment: He is improving on broad therapy for culture-negative prosthetic knee infection. I will plan on continuing his current regimen for a full 6 weeks postoperatively then converted over to an oral antibiotic regimen to complete 6 months of therapy.  He is already on anticoagulation for his history of pulmonary embolus but I will check an ultrasound of his right arm to make sure there is no clot but his PICC.Marland Kitchen  Plan: 1. Continue current antibiotics 2. Doppler ultrasound of right arm 3. Followup on October 20   Cliffton Asters, MD [facility] for Infectious Disease [facility] [phone] pager   [phone] cell 10/23/2012, 11:13 AM  Addendum:  Right upper extremity venous duplex completed. Right: DVT noted in the subclavian and axillary veins. Superficial thrombus noted in the basilic vein. Left: Negative for DVT in the subclavian vein.   I spoke with Weston Brass, pharmacist in the [facility] Coumadin clinic. [name] is followed there for his chronic anticoagulation related to his history of pulmonary embolus. I note that his last  INR on September 5 was 1.7. She will have him come in to clinic to have a repeat INR and start Lovenox to bridge him if necessary. I will leave his PICC in his right arm for now. However, if the swelling worsens he will need to have it removed and placed in his left arm.  Cliffton Asters, MD [facility] for Infectious Disease [facility] [phone] pager   [phone] cell 10/23/2012, 1:06 PM

## 2012-10-24 DIAGNOSIS — Z4789 Encounter for other orthopedic aftercare: Secondary | ICD-10-CM | POA: Diagnosis not present

## 2012-10-24 DIAGNOSIS — Z96659 Presence of unspecified artificial knee joint: Secondary | ICD-10-CM | POA: Diagnosis not present

## 2012-10-24 DIAGNOSIS — I10 Essential (primary) hypertension: Secondary | ICD-10-CM | POA: Diagnosis not present

## 2012-10-24 DIAGNOSIS — I2510 Atherosclerotic heart disease of native coronary artery without angina pectoris: Secondary | ICD-10-CM | POA: Diagnosis not present

## 2012-10-24 DIAGNOSIS — Z452 Encounter for adjustment and management of vascular access device: Secondary | ICD-10-CM | POA: Diagnosis not present

## 2012-10-25 ENCOUNTER — Telehealth: Payer: Self-pay | Admitting: *Deleted

## 2012-10-25 DIAGNOSIS — Z4789 Encounter for other orthopedic aftercare: Secondary | ICD-10-CM | POA: Diagnosis not present

## 2012-10-25 DIAGNOSIS — Z452 Encounter for adjustment and management of vascular access device: Secondary | ICD-10-CM | POA: Diagnosis not present

## 2012-10-25 DIAGNOSIS — I2510 Atherosclerotic heart disease of native coronary artery without angina pectoris: Secondary | ICD-10-CM | POA: Diagnosis not present

## 2012-10-25 DIAGNOSIS — I10 Essential (primary) hypertension: Secondary | ICD-10-CM | POA: Diagnosis not present

## 2012-10-25 DIAGNOSIS — Z96659 Presence of unspecified artificial knee joint: Secondary | ICD-10-CM | POA: Diagnosis not present

## 2012-10-25 NOTE — Telephone Encounter (Signed)
Spoke with patient.  He stated he is doing well, that the swelling is "down by half or more" and the sharp pain in his right axillary area has resolved.  Pt was bridged with Lovenox, reports no issues with the injections.  Pt states his PICC is infusing antibiotics well.  Pt knows to go to the ED if he develops any additional swelling or pain over the weekend to have his PICC placement assessed.  As of now, pt's PICC is working well and doesn't need to be changed. Andree Coss, RN

## 2012-10-28 ENCOUNTER — Ambulatory Visit (INDEPENDENT_AMBULATORY_CARE_PROVIDER_SITE_OTHER): Payer: Medicare Other | Admitting: Cardiovascular Disease

## 2012-10-28 DIAGNOSIS — Z7901 Long term (current) use of anticoagulants: Secondary | ICD-10-CM

## 2012-10-28 DIAGNOSIS — Z96659 Presence of unspecified artificial knee joint: Secondary | ICD-10-CM | POA: Diagnosis not present

## 2012-10-28 DIAGNOSIS — I2699 Other pulmonary embolism without acute cor pulmonale: Secondary | ICD-10-CM

## 2012-10-28 DIAGNOSIS — I2510 Atherosclerotic heart disease of native coronary artery without angina pectoris: Secondary | ICD-10-CM | POA: Diagnosis not present

## 2012-10-28 DIAGNOSIS — I10 Essential (primary) hypertension: Secondary | ICD-10-CM | POA: Diagnosis not present

## 2012-10-28 DIAGNOSIS — Z452 Encounter for adjustment and management of vascular access device: Secondary | ICD-10-CM | POA: Diagnosis not present

## 2012-10-28 DIAGNOSIS — Z4789 Encounter for other orthopedic aftercare: Secondary | ICD-10-CM | POA: Diagnosis not present

## 2012-10-28 DIAGNOSIS — I82409 Acute embolism and thrombosis of unspecified deep veins of unspecified lower extremity: Secondary | ICD-10-CM

## 2012-10-29 ENCOUNTER — Telehealth: Payer: Self-pay

## 2012-10-29 DIAGNOSIS — Z4789 Encounter for other orthopedic aftercare: Secondary | ICD-10-CM | POA: Diagnosis not present

## 2012-10-29 DIAGNOSIS — I10 Essential (primary) hypertension: Secondary | ICD-10-CM | POA: Diagnosis not present

## 2012-10-29 DIAGNOSIS — I2510 Atherosclerotic heart disease of native coronary artery without angina pectoris: Secondary | ICD-10-CM | POA: Diagnosis not present

## 2012-10-29 DIAGNOSIS — Z452 Encounter for adjustment and management of vascular access device: Secondary | ICD-10-CM | POA: Diagnosis not present

## 2012-10-29 DIAGNOSIS — Z96659 Presence of unspecified artificial knee joint: Secondary | ICD-10-CM | POA: Diagnosis not present

## 2012-10-30 DIAGNOSIS — Z452 Encounter for adjustment and management of vascular access device: Secondary | ICD-10-CM | POA: Diagnosis not present

## 2012-10-30 DIAGNOSIS — Z96659 Presence of unspecified artificial knee joint: Secondary | ICD-10-CM | POA: Diagnosis not present

## 2012-10-30 DIAGNOSIS — Z4789 Encounter for other orthopedic aftercare: Secondary | ICD-10-CM | POA: Diagnosis not present

## 2012-10-30 DIAGNOSIS — I10 Essential (primary) hypertension: Secondary | ICD-10-CM | POA: Diagnosis not present

## 2012-10-30 DIAGNOSIS — I2510 Atherosclerotic heart disease of native coronary artery without angina pectoris: Secondary | ICD-10-CM | POA: Diagnosis not present

## 2012-11-01 ENCOUNTER — Ambulatory Visit (INDEPENDENT_AMBULATORY_CARE_PROVIDER_SITE_OTHER): Payer: Medicare Other | Admitting: Cardiovascular Disease

## 2012-11-01 DIAGNOSIS — Z452 Encounter for adjustment and management of vascular access device: Secondary | ICD-10-CM | POA: Diagnosis not present

## 2012-11-01 DIAGNOSIS — I2699 Other pulmonary embolism without acute cor pulmonale: Secondary | ICD-10-CM

## 2012-11-01 DIAGNOSIS — Z4789 Encounter for other orthopedic aftercare: Secondary | ICD-10-CM | POA: Diagnosis not present

## 2012-11-01 DIAGNOSIS — Z7901 Long term (current) use of anticoagulants: Secondary | ICD-10-CM

## 2012-11-01 DIAGNOSIS — I2510 Atherosclerotic heart disease of native coronary artery without angina pectoris: Secondary | ICD-10-CM | POA: Diagnosis not present

## 2012-11-01 DIAGNOSIS — I82409 Acute embolism and thrombosis of unspecified deep veins of unspecified lower extremity: Secondary | ICD-10-CM

## 2012-11-01 DIAGNOSIS — Z96659 Presence of unspecified artificial knee joint: Secondary | ICD-10-CM | POA: Diagnosis not present

## 2012-11-01 DIAGNOSIS — I10 Essential (primary) hypertension: Secondary | ICD-10-CM | POA: Diagnosis not present

## 2012-11-01 LAB — POCT INR: INR: 1.8

## 2012-11-01 MED ORDER — ENOXAPARIN SODIUM 100 MG/ML ~~LOC~~ SOLN: 100.0000 mg | Freq: Two times a day (BID) | SUBCUTANEOUS | Status: DC

## 2012-11-04 ENCOUNTER — Telehealth: Payer: Self-pay | Admitting: *Deleted

## 2012-11-04 ENCOUNTER — Ambulatory Visit (INDEPENDENT_AMBULATORY_CARE_PROVIDER_SITE_OTHER): Payer: Medicare Other | Admitting: Cardiology

## 2012-11-04 ENCOUNTER — Other Ambulatory Visit: Payer: Self-pay | Admitting: *Deleted

## 2012-11-04 DIAGNOSIS — I10 Essential (primary) hypertension: Secondary | ICD-10-CM | POA: Diagnosis not present

## 2012-11-04 DIAGNOSIS — T8454XD Infection and inflammatory reaction due to internal left knee prosthesis, subsequent encounter: Secondary | ICD-10-CM

## 2012-11-04 DIAGNOSIS — Z96659 Presence of unspecified artificial knee joint: Secondary | ICD-10-CM | POA: Diagnosis not present

## 2012-11-04 DIAGNOSIS — I82409 Acute embolism and thrombosis of unspecified deep veins of unspecified lower extremity: Secondary | ICD-10-CM

## 2012-11-04 DIAGNOSIS — Z7901 Long term (current) use of anticoagulants: Secondary | ICD-10-CM

## 2012-11-04 DIAGNOSIS — I2510 Atherosclerotic heart disease of native coronary artery without angina pectoris: Secondary | ICD-10-CM | POA: Diagnosis not present

## 2012-11-04 DIAGNOSIS — Z4789 Encounter for other orthopedic aftercare: Secondary | ICD-10-CM | POA: Diagnosis not present

## 2012-11-04 DIAGNOSIS — Z452 Encounter for adjustment and management of vascular access device: Secondary | ICD-10-CM | POA: Diagnosis not present

## 2012-11-04 DIAGNOSIS — I2699 Other pulmonary embolism without acute cor pulmonale: Secondary | ICD-10-CM

## 2012-11-04 LAB — POCT INR: INR: 2.4

## 2012-11-04 MED ORDER — RIFAMPIN 300 MG PO CAPS: 300.0000 mg | ORAL_CAPSULE | Freq: Two times a day (BID) | ORAL | Status: DC

## 2012-11-04 MED ORDER — CIPROFLOXACIN HCL 750 MG PO TABS: 750.0000 mg | ORAL_TABLET | Freq: Two times a day (BID) | ORAL | Status: DC

## 2012-11-04 NOTE — Telephone Encounter (Signed)
Pt's wife [name] called regarding pt's antibiotic therapy.  He has been taking 2 rifampin and 2 cipro BID (9am and 9pm), but only has 2 weeks left.  Should he stay on these and for how long?  She also tates pt is now therapeutic on coumadin and has stopped the lovenox shots. The swelling has reduced, but they are wondering if he needs a repeat doppler to see if the clot has dissolved.  Please advise. Andree Coss, RN

## 2012-11-04 NOTE — Telephone Encounter (Signed)
Gave patient's wife information about the repeat doppler.  She agreed.  Also, she says she misspoke, that he is only taking 1 ciprofloxacin BID and 1 rifampin BID as ordered.  RN sent a refill to get him through at least his f/u on 10/21.  Pt is scheduled to complete IV Vancomycin on/around 10/15; his wife wanted to know if the PICC should stay in through the 21st.  RN told the patient's wife that it was too early at this point to schedule pulling the PICC before his follow up appointment, that weekly labs would be drawn to help assess his status, and that this would be discussed at his follow up.  Pt's wife verbalized understanding.

## 2012-11-04 NOTE — Telephone Encounter (Signed)
He should be taking 1 ciprofloxacin bid and 1 rifampin bid and he should continue them at least until his next visit on 10/21. He does not need a f/u doppler if he is doing better since that will not change his management.

## 2012-11-05 DIAGNOSIS — I10 Essential (primary) hypertension: Secondary | ICD-10-CM | POA: Diagnosis not present

## 2012-11-05 DIAGNOSIS — I2510 Atherosclerotic heart disease of native coronary artery without angina pectoris: Secondary | ICD-10-CM | POA: Diagnosis not present

## 2012-11-05 DIAGNOSIS — Z96659 Presence of unspecified artificial knee joint: Secondary | ICD-10-CM | POA: Diagnosis not present

## 2012-11-05 DIAGNOSIS — Z4789 Encounter for other orthopedic aftercare: Secondary | ICD-10-CM | POA: Diagnosis not present

## 2012-11-05 DIAGNOSIS — Z452 Encounter for adjustment and management of vascular access device: Secondary | ICD-10-CM | POA: Diagnosis not present

## 2012-11-06 ENCOUNTER — Telehealth: Payer: Self-pay | Admitting: *Deleted

## 2012-11-06 DIAGNOSIS — I10 Essential (primary) hypertension: Secondary | ICD-10-CM | POA: Diagnosis not present

## 2012-11-06 DIAGNOSIS — I2510 Atherosclerotic heart disease of native coronary artery without angina pectoris: Secondary | ICD-10-CM | POA: Diagnosis not present

## 2012-11-06 DIAGNOSIS — Z4789 Encounter for other orthopedic aftercare: Secondary | ICD-10-CM | POA: Diagnosis not present

## 2012-11-06 DIAGNOSIS — Z96659 Presence of unspecified artificial knee joint: Secondary | ICD-10-CM | POA: Diagnosis not present

## 2012-11-06 DIAGNOSIS — Z452 Encounter for adjustment and management of vascular access device: Secondary | ICD-10-CM | POA: Diagnosis not present

## 2012-11-06 NOTE — Telephone Encounter (Signed)
Pt called about his coumadin dosage, he misunderstood his dose instructions and only took 5mg s yesterday, thus instructed him to take his scheduled 12.5mg s today and tomorrow and [facility] nurse is due to recheck him on  Friday.

## 2012-11-08 ENCOUNTER — Ambulatory Visit (INDEPENDENT_AMBULATORY_CARE_PROVIDER_SITE_OTHER): Payer: Medicare Other | Admitting: Cardiovascular Disease

## 2012-11-08 DIAGNOSIS — Z4789 Encounter for other orthopedic aftercare: Secondary | ICD-10-CM | POA: Diagnosis not present

## 2012-11-08 DIAGNOSIS — I10 Essential (primary) hypertension: Secondary | ICD-10-CM | POA: Diagnosis not present

## 2012-11-08 DIAGNOSIS — Z452 Encounter for adjustment and management of vascular access device: Secondary | ICD-10-CM | POA: Diagnosis not present

## 2012-11-08 DIAGNOSIS — I2699 Other pulmonary embolism without acute cor pulmonale: Secondary | ICD-10-CM

## 2012-11-08 DIAGNOSIS — I2510 Atherosclerotic heart disease of native coronary artery without angina pectoris: Secondary | ICD-10-CM | POA: Diagnosis not present

## 2012-11-08 DIAGNOSIS — Z7901 Long term (current) use of anticoagulants: Secondary | ICD-10-CM

## 2012-11-08 DIAGNOSIS — Z96659 Presence of unspecified artificial knee joint: Secondary | ICD-10-CM | POA: Diagnosis not present

## 2012-11-08 DIAGNOSIS — I82409 Acute embolism and thrombosis of unspecified deep veins of unspecified lower extremity: Secondary | ICD-10-CM

## 2012-11-08 LAB — POCT INR: INR: 2.1

## 2012-11-12 DIAGNOSIS — I10 Essential (primary) hypertension: Secondary | ICD-10-CM | POA: Diagnosis not present

## 2012-11-12 DIAGNOSIS — Z96659 Presence of unspecified artificial knee joint: Secondary | ICD-10-CM | POA: Diagnosis not present

## 2012-11-12 DIAGNOSIS — I2510 Atherosclerotic heart disease of native coronary artery without angina pectoris: Secondary | ICD-10-CM | POA: Diagnosis not present

## 2012-11-12 DIAGNOSIS — Z452 Encounter for adjustment and management of vascular access device: Secondary | ICD-10-CM | POA: Diagnosis not present

## 2012-11-12 DIAGNOSIS — A5449 Gonococcal infection of other musculoskeletal tissue: Secondary | ICD-10-CM | POA: Diagnosis not present

## 2012-11-12 DIAGNOSIS — Z4789 Encounter for other orthopedic aftercare: Secondary | ICD-10-CM | POA: Diagnosis not present

## 2012-11-14 ENCOUNTER — Telehealth: Payer: Self-pay | Admitting: *Deleted

## 2012-11-14 DIAGNOSIS — Z96659 Presence of unspecified artificial knee joint: Secondary | ICD-10-CM | POA: Diagnosis not present

## 2012-11-14 NOTE — Telephone Encounter (Signed)
Per pt's wife, the pharmacy called today to inform them the stop date of IV ABX is supposed to be 10/16, completing 6 weeks of therapy post op.    They will have family staying with them 10/18 - 10/21 who have spent August as missionaries in [place].  Pt's wife is concerned about the risk of infection to her husband while hosting people with recent foreign travel.  RN educated pt's wife about PICC care/maintenance, stating that there should be no trouble as long as the dressing is intact and only those with training access his PICC line.  Pt's wife is inquiring if 1) their follow up could be moved to 10/14 to make sure that everything is ok before the patient is due to stop his antibiotics OR 2) home health can pull the PICC on 10/17 -- before coming to see you 10/21 (reducing the risk of contamination while they are hosting family).   Andree Coss, RN

## 2012-11-15 ENCOUNTER — Ambulatory Visit (INDEPENDENT_AMBULATORY_CARE_PROVIDER_SITE_OTHER): Payer: Medicare Other | Admitting: Pharmacist

## 2012-11-15 DIAGNOSIS — Z7901 Long term (current) use of anticoagulants: Secondary | ICD-10-CM

## 2012-11-15 DIAGNOSIS — I2510 Atherosclerotic heart disease of native coronary artery without angina pectoris: Secondary | ICD-10-CM | POA: Diagnosis not present

## 2012-11-15 DIAGNOSIS — I10 Essential (primary) hypertension: Secondary | ICD-10-CM | POA: Diagnosis not present

## 2012-11-15 DIAGNOSIS — Z4789 Encounter for other orthopedic aftercare: Secondary | ICD-10-CM | POA: Diagnosis not present

## 2012-11-15 DIAGNOSIS — I82409 Acute embolism and thrombosis of unspecified deep veins of unspecified lower extremity: Secondary | ICD-10-CM

## 2012-11-15 DIAGNOSIS — Z452 Encounter for adjustment and management of vascular access device: Secondary | ICD-10-CM | POA: Diagnosis not present

## 2012-11-15 DIAGNOSIS — Z96659 Presence of unspecified artificial knee joint: Secondary | ICD-10-CM | POA: Diagnosis not present

## 2012-11-15 DIAGNOSIS — I2699 Other pulmonary embolism without acute cor pulmonale: Secondary | ICD-10-CM

## 2012-11-15 LAB — POCT INR: INR: 2.5

## 2012-11-15 NOTE — Telephone Encounter (Signed)
Please add him onto my schedule on October 14 at 1:30.

## 2012-11-15 NOTE — Telephone Encounter (Signed)
Done.  The patient is very appreciative!

## 2012-11-19 ENCOUNTER — Telehealth: Payer: Self-pay | Admitting: Cardiology

## 2012-11-19 ENCOUNTER — Ambulatory Visit (INDEPENDENT_AMBULATORY_CARE_PROVIDER_SITE_OTHER): Payer: Medicare Other | Admitting: Internal Medicine

## 2012-11-19 VITALS — BP 118/74 | HR 70 | Temp 97.7°F | Ht 72.0 in | Wt 234.0 lb

## 2012-11-19 DIAGNOSIS — Z5189 Encounter for other specified aftercare: Secondary | ICD-10-CM | POA: Diagnosis not present

## 2012-11-19 DIAGNOSIS — M7989 Other specified soft tissue disorders: Secondary | ICD-10-CM | POA: Diagnosis not present

## 2012-11-19 DIAGNOSIS — T8454XD Infection and inflammatory reaction due to internal left knee prosthesis, subsequent encounter: Secondary | ICD-10-CM

## 2012-11-19 MED ORDER — DOXYCYCLINE HYCLATE 100 MG PO TABS: 100.0000 mg | ORAL_TABLET | Freq: Two times a day (BID) | ORAL | Status: DC

## 2012-11-19 MED ORDER — CIPROFLOXACIN HCL 750 MG PO TABS: 750.0000 mg | ORAL_TABLET | Freq: Two times a day (BID) | ORAL | Status: DC

## 2012-11-19 NOTE — Telephone Encounter (Signed)
New Problem   Pt was recently with gentiva and a pick was taken out of his right arm today... pt wants to know what he should do moving forward in regards to coumadin.  Please advise.

## 2012-11-19 NOTE — Telephone Encounter (Signed)
Talked with pt and he states that has had PICC line removed and does not know if [facility] is going to continue to see him. Pt instructed to call [facility] and find out if they will see him on Friday to obtain INR and he will call and let us know .

## 2012-11-19 NOTE — Telephone Encounter (Signed)
Pt returned call stating had talked with [place] and they will be out to see him on Friday and obtain INR and will call with results and will discharge him from [facility] on Friday and we will at that time set up appt for him to be seen in our clinic

## 2012-11-19 NOTE — Progress Notes (Signed)
Patient ID: [name], male   DOB: [birth year], 79 y.o.   MRN: [id number]         [facility] for Infectious Disease  Patient Active Problem List   Diagnosis Date Noted  . Infection of prosthetic left knee joint 10/09/2012    Priority: High  . Long term (current) use of anticoagulants 10/06/2011  . Hypoxia 10/03/2011  . Pulmonary embolism 09/28/2011  . Overweight(278.02) 04/28/2011  . Preop cardiovascular exam 04/28/2011  . Mixed hyperlipidemia 02/24/2010  . CAD, NATIVE VESSEL 09/28/2009  . DIZZINESS 09/28/2009  . PROSTATE CANCER 09/24/2009  . HYPERTENSION 09/24/2009  . DVT 09/24/2009  . GERD 09/24/2009    Patient's Medications  New Prescriptions   DOXYCYCLINE (VIBRA-TABS) 100 MG TABLET    Take 1 tablet (100 mg total) by mouth 2 (two) times daily.  Previous Medications   CYANOCOBALAMIN (B-12) 1000 MCG CAPS    Take 1 tablet by mouth daily.    DIPHENHYDRAMINE-APAP, SLEEP, (EXCEDRIN PM PO)    Take 1 tablet by mouth at bedtime as needed (pain/sleep).    ENOXAPARIN (LOVENOX) 100 MG/ML INJECTION    Inject 1 mL (100 mg total) into the skin every 12 (twelve) hours.   ESOMEPRAZOLE (NEXIUM) 40 MG CAPSULE    Take 40 mg by mouth daily before breakfast.    FENOFIBRATE 160 MG TABLET    Take 160 mg by mouth daily.   METHOCARBAMOL (ROBAXIN) 500 MG TABLET    Take 1 tablet (500 mg total) by mouth every 6 (six) hours as needed.   MISC NATURAL PRODUCTS (GLUCOSAMINE CHONDROITIN ADV PO)    Take 1 tablet by mouth daily.    MULTIPLE VITAMIN (MULTIVITAMIN) CAPSULE    Take 1 capsule by mouth daily.    NITROGLYCERIN (NITROSTAT) 0.4 MG SL TABLET    Place 1 tablet (0.4 mg total) under the tongue every 5 (five) minutes as needed.   OMEGA-3 FATTY ACIDS (FISH OIL CONCENTRATE PO)    Take 2 capsules by mouth daily.    OVER THE COUNTER MEDICATION    Take 1 capsule by mouth daily. STOOL SOFTNER DAILY   OXYCODONE (OXY IR/ROXICODONE) 5 MG IMMEDIATE RELEASE TABLET    Take 1-2 tablets (5-10 mg total) by  mouth every 3 (three) hours as needed.   PRAVASTATIN (PRAVACHOL) 20 MG TABLET    Take 20 mg by mouth daily.    RIFAMPIN (RIFADIN) 300 MG CAPSULE    Take 1 capsule (300 mg total) by mouth every 12 (twelve) hours.   TRAMADOL (ULTRAM) 50 MG TABLET    Take 1-2 tablets (50-100 mg total) by mouth every 6 (six) hours as needed.   WARFARIN (COUMADIN) 5 MG TABLET    Take 1 tablet (5 mg total) by mouth daily.  Modified Medications   Modified Medication Previous Medication   CIPROFLOXACIN (CIPRO) 750 MG TABLET ciprofloxacin (CIPRO) 750 MG tablet      Take 1 tablet (750 mg total) by mouth 2 (two) times daily.    Take 1 tablet (750 mg total) by mouth 2 (two) times daily.  Discontinued Medications   SODIUM CHLORIDE 0.9 % SOLN 500 ML WITH VANCOMYCIN 10 G SOLR    Inject 1,500 mg into the vein every 12 (twelve) hours.    Subjective: [name] has now completed 6 weeks of empiric antibiotic therapy for culture-negative left prosthetic knee infection. He's had no problems tolerating his PICC or IV vancomycin, oral ciprofloxacin and oral rifampin.  Objective: Temp: 97.7 F (36.5 C) (10/14  1341) Temp src: Oral (10/14 1341) BP: 118/74 mmHg (10/14 1341) Pulse Rate: 70 (10/14 1341)  General: He is in good spirits Skin: Right arm PICC site appears normal His left knee incision is healing nicely. His knee is slightly warm but he has good range of motion with no signs of active infection.    Assessment: He is responding well to empiric therapy for his prosthetic knee infection.. His IV vancomycin and have the PICC pulled. I will add doxycycline and continue ciprofloxacin and rifampin for a total of 6 months.  Plan: 1. Discontinue vancomycin 2. Remove PICC 3. Start doxycycline 100 mg twice daily along with oral ciprofloxacin and rifampin 4. Followup in 2 months   Cliffton Asters, MD [facility] for Infectious Disease [facility] [phone] pager   [phone] cell 11/19/2012, 2:04 PM

## 2012-11-22 ENCOUNTER — Ambulatory Visit (INDEPENDENT_AMBULATORY_CARE_PROVIDER_SITE_OTHER): Payer: Medicare Other | Admitting: Cardiology

## 2012-11-22 DIAGNOSIS — Z452 Encounter for adjustment and management of vascular access device: Secondary | ICD-10-CM | POA: Diagnosis not present

## 2012-11-22 DIAGNOSIS — I10 Essential (primary) hypertension: Secondary | ICD-10-CM | POA: Diagnosis not present

## 2012-11-22 DIAGNOSIS — I2699 Other pulmonary embolism without acute cor pulmonale: Secondary | ICD-10-CM

## 2012-11-22 DIAGNOSIS — Z7901 Long term (current) use of anticoagulants: Secondary | ICD-10-CM

## 2012-11-22 DIAGNOSIS — I82409 Acute embolism and thrombosis of unspecified deep veins of unspecified lower extremity: Secondary | ICD-10-CM

## 2012-11-22 DIAGNOSIS — Z96659 Presence of unspecified artificial knee joint: Secondary | ICD-10-CM | POA: Diagnosis not present

## 2012-11-22 DIAGNOSIS — I2510 Atherosclerotic heart disease of native coronary artery without angina pectoris: Secondary | ICD-10-CM | POA: Diagnosis not present

## 2012-11-22 DIAGNOSIS — Z4789 Encounter for other orthopedic aftercare: Secondary | ICD-10-CM | POA: Diagnosis not present

## 2012-11-22 LAB — POCT INR: INR: 2.6

## 2012-11-26 ENCOUNTER — Ambulatory Visit: Payer: Medicare Other | Admitting: Internal Medicine

## 2012-12-06 ENCOUNTER — Ambulatory Visit (INDEPENDENT_AMBULATORY_CARE_PROVIDER_SITE_OTHER): Payer: Medicare Other | Admitting: General Practice

## 2012-12-06 DIAGNOSIS — Z7901 Long term (current) use of anticoagulants: Secondary | ICD-10-CM

## 2012-12-06 DIAGNOSIS — I2699 Other pulmonary embolism without acute cor pulmonale: Secondary | ICD-10-CM

## 2012-12-06 DIAGNOSIS — I82409 Acute embolism and thrombosis of unspecified deep veins of unspecified lower extremity: Secondary | ICD-10-CM | POA: Diagnosis not present

## 2012-12-06 LAB — POCT INR: INR: 2.6

## 2012-12-09 ENCOUNTER — Telehealth: Payer: Self-pay | Admitting: *Deleted

## 2012-12-09 NOTE — Telephone Encounter (Signed)
Pt would like to know if there would be any interactions between his antibiotics (Doxy 100 mg BID, cipro 750 BID, rifampin 300 BID) and 5ml hydrocodone QHS PRN for his cough.  Please advise if this is ok to take together. Andree Coss, RN

## 2012-12-10 NOTE — Telephone Encounter (Signed)
Rifampin can decrease hydrocodone levels and activity but this is offset by some slight increase in hydrocodone levels and activity caused by doxycycline and ciprofloxacin. Overall, I doubt that he will have any significant interactions.

## 2012-12-10 NOTE — Telephone Encounter (Signed)
Patient notified.     Thank you!

## 2012-12-16 ENCOUNTER — Other Ambulatory Visit: Payer: Self-pay | Admitting: Cardiology

## 2012-12-19 ENCOUNTER — Other Ambulatory Visit: Payer: Self-pay | Admitting: Internal Medicine

## 2012-12-25 DIAGNOSIS — H905 Unspecified sensorineural hearing loss: Secondary | ICD-10-CM | POA: Diagnosis not present

## 2012-12-25 DIAGNOSIS — H903 Sensorineural hearing loss, bilateral: Secondary | ICD-10-CM | POA: Diagnosis not present

## 2012-12-30 ENCOUNTER — Ambulatory Visit (INDEPENDENT_AMBULATORY_CARE_PROVIDER_SITE_OTHER): Payer: Medicare Other | Admitting: Cardiology

## 2012-12-30 ENCOUNTER — Encounter: Payer: Self-pay | Admitting: Cardiology

## 2012-12-30 ENCOUNTER — Ambulatory Visit (INDEPENDENT_AMBULATORY_CARE_PROVIDER_SITE_OTHER): Payer: Medicare Other | Admitting: Pharmacist

## 2012-12-30 VITALS — BP 138/80 | HR 80 | Ht 72.0 in | Wt 231.0 lb

## 2012-12-30 DIAGNOSIS — I2510 Atherosclerotic heart disease of native coronary artery without angina pectoris: Secondary | ICD-10-CM | POA: Diagnosis not present

## 2012-12-30 DIAGNOSIS — I2699 Other pulmonary embolism without acute cor pulmonale: Secondary | ICD-10-CM | POA: Diagnosis not present

## 2012-12-30 DIAGNOSIS — I82409 Acute embolism and thrombosis of unspecified deep veins of unspecified lower extremity: Secondary | ICD-10-CM

## 2012-12-30 DIAGNOSIS — Z7901 Long term (current) use of anticoagulants: Secondary | ICD-10-CM

## 2012-12-30 NOTE — Patient Instructions (Signed)
The current medical regimen is effective;  continue present plan and medications.  Follow up in 1 year with Dr Hochrein.  You will receive a letter in the mail 2 months before you are due.  Please call us when you receive this letter to schedule your follow up appointment.  

## 2012-12-30 NOTE — Progress Notes (Signed)
HPI The patient presents for followup. He had stenting of his circumflex in July of last 2011.  He was also hospitalized in 2013 for dyspnea and found to have a pulmonary embolism.  During that admission he did have an echocardiogram which demonstrated some mildly reduced systolic right ventricular function. Earlier this year he had a stress test.  Since that time he's not had any cardiovascular problems. He has had infection of his knee and had to have surgical debridement of this and antibiotics. Despite this he's not had any chest pressure, neck or arm discomfort. He's not had any new palpitations, presyncope or syncope. He denies any shortness of breath, PND or orthopnea. He is still able to be active.   Allergies  Allergen Reactions  . Darifenacin Hydrobromide Er Nausea Only    Dizziness    Current Outpatient Prescriptions  Medication Sig Dispense Refill  . ciprofloxacin (CIPRO) 750 MG tablet Take 1 tablet (750 mg total) by mouth 2 (two) times daily.  60 tablet  0  . Cyanocobalamin (B-12) 1000 MCG CAPS Take 1 tablet by mouth daily.       . Diphenhydramine-APAP, sleep, (EXCEDRIN PM PO) Take 1 tablet by mouth at bedtime as needed (pain/sleep).       Marland Kitchen doxycycline (VIBRA-TABS) 100 MG tablet Take 1 tablet (100 mg total) by mouth 2 (two) times daily.  60 tablet  4  . enoxaparin (LOVENOX) 100 MG/ML injection Inject 1 mL (100 mg total) into the skin every 12 (twelve) hours.  10 Syringe  0  . esomeprazole (NEXIUM) 40 MG capsule Take 40 mg by mouth daily before breakfast.       . fenofibrate 160 MG tablet Take 160 mg by mouth daily.      . methocarbamol (ROBAXIN) 500 MG tablet Take 1 tablet (500 mg total) by mouth every 6 (six) hours as needed.  60 tablet  1  . Misc Natural Products (GLUCOSAMINE CHONDROITIN ADV PO) Take 1 tablet by mouth daily.       . Multiple Vitamin (MULTIVITAMIN) capsule Take 1 capsule by mouth daily.       . nitroGLYCERIN (NITROSTAT) 0.4 MG SL tablet Place 1 tablet (0.4 mg  total) under the tongue every 5 (five) minutes as needed.  25 tablet  8  . Omega-3 Fatty Acids (FISH OIL CONCENTRATE PO) Take 2 capsules by mouth daily.       Marland Kitchen OVER THE COUNTER MEDICATION Take 1 capsule by mouth daily. STOOL SOFTNER DAILY      . oxyCODONE (OXY IR/ROXICODONE) 5 MG immediate release tablet Take 1-2 tablets (5-10 mg total) by mouth every 3 (three) hours as needed.  80 tablet  0  . pravastatin (PRAVACHOL) 20 MG tablet TAKE 1 TABLET EVERY DAY  30 tablet  2  . rifampin (RIFADIN) 300 MG capsule TAKE 1 CAPSULE (300 MG TOTAL) BY MOUTH EVERY 12 (TWELVE) HOURS.  60 capsule  0  . traMADol (ULTRAM) 50 MG tablet Take 1-2 tablets (50-100 mg total) by mouth every 6 (six) hours as needed.  80 tablet  0  . warfarin (COUMADIN) 5 MG tablet TAKE 1 TABLET BY MOUTH DAILY  70 tablet  3   No current facility-administered medications for this visit.    Past Medical History  Diagnosis Date  . CAD, NATIVE VESSEL     PCI and DES to circumflex  . DVT 2006    on coumadin x1 year, IVC filter placed  . Mixed hyperlipidemia   . PROSTATE  CANCER   . GERD   . Prostate cancer     s/p mutliple urologic procedures  . Pulmonary embolus 2013    LIFETIME ANTICOAGULATION BECAUSE OF PE AND PAST HX OF DVT  . MI (myocardial infarction) 08/25/09  . HYPERTENSION     PT DENIES HAVING HYPERTENSION  . Urinary incontinence     MULTIPLE BLADDER SURGERIES - STATES NO URINARY SPHINCTER - PT'S UROLOGIST IS AT [facility]- DR. PETERSON  ( LAST VISIT WAS 09/15/11 )    Past Surgical History  Procedure Laterality Date  . Prostatectomy  04/25/2004  . Knee surgery      BILATERAL TOTAL KNEE REPLACEMENTS  2007 RT AND 2009 LEFT  . Hernia repair    . Vena cava filter placement    . Cardiac catheterization  2011    DES-mid LCx 08/2009; 30% pLAD, 25% m/dLAD, 75% ostial D1, 99% mLCx s/p DES, 25-30% p/mRCA, 40% mRCA, 30% mPDA stenoses; LVEF 50%, inf hypokinesis  . Coronary angioplasty  08/25/09    STENT PLACEMENT  . Knee arthrotomy Left  10/09/2012    Procedure: LEFT KNEE ARTHROTOMY;  Surgeon: Loanne Drilling, MD;  Location: [facility];  Service: Orthopedics;  Laterality: Left;  . I&d knee with poly exchange Left 10/09/2012    Procedure: IRRIGATION AND DEBRIDEMENT LEFT KNEE WITH POLY REVISION;  Surgeon: Loanne Drilling, MD;  Location: [facility];  Service: Orthopedics;  Laterality: Left;    ROS:  Urinary incontinence. Otherwise as stated in the HPI and negative for all other systems.  PHYSICAL EXAM BP 138/80  Pulse 80  Ht 6' (1.829 m)  Wt 231 lb (104.781 kg)  BMI 31.32 kg/m2 GENERAL:  Well appearing LYMPHATICS:  No cervical, inguinal adenopathy LUNGS:  Clear to auscultation bilaterally CHEST:  Unremarkable HEART:  PMI not displaced or sustained,S1 and S2 within normal limits, no S3, no S4, no clicks, no rubs, soft apical systolic early peaking murmur, no diastolic murmurs ABD:  Flat, positive bowel sounds normal in frequency in pitch, no bruits, no rebound, no guarding, no midline pulsatile mass, no hepatomegaly, no splenomegaly, obese EXT:  2 plus pulses throughout, no edema, no cyanosis no clubbing SKIN:  No rashes no nodules   EKG:  Sinus rhythm, rate 80, first degree AV block, early transition in lead V2, ventricular ectopy, nonspecific T-wave flattening. 12/30/2012   ASSESSMENT AND PLAN  DVT He will need continued warfarin with recurrent DVT/PE  CAD, NATIVE VESSEL -  The patient has no new symptoms since the stress test earlier this year.  No change in therapy.   HYPERTENSION -  The blood pressure is at target. No change in medications is indicated. We will continue with therapeutic lifestyle changes (TLC).   MIXED HYPERLIPIDEMIA -  This was recently checked by Johny Blamer, MD.  I will defer to his management

## 2013-01-14 ENCOUNTER — Other Ambulatory Visit: Payer: Self-pay | Admitting: Cardiology

## 2013-01-15 DIAGNOSIS — S20219A Contusion of unspecified front wall of thorax, initial encounter: Secondary | ICD-10-CM | POA: Diagnosis not present

## 2013-01-20 ENCOUNTER — Other Ambulatory Visit: Payer: Self-pay | Admitting: *Deleted

## 2013-01-20 MED ORDER — WARFARIN SODIUM 5 MG PO TABS: ORAL_TABLET | ORAL | Status: DC

## 2013-01-23 ENCOUNTER — Encounter: Payer: Self-pay | Admitting: Internal Medicine

## 2013-01-23 ENCOUNTER — Ambulatory Visit (INDEPENDENT_AMBULATORY_CARE_PROVIDER_SITE_OTHER): Payer: Medicare Other | Admitting: Internal Medicine

## 2013-01-23 VITALS — BP 129/75 | HR 79 | Temp 97.8°F | Ht 73.0 in | Wt 237.0 lb

## 2013-01-23 DIAGNOSIS — Z23 Encounter for immunization: Secondary | ICD-10-CM | POA: Diagnosis not present

## 2013-01-23 DIAGNOSIS — Z5189 Encounter for other specified aftercare: Secondary | ICD-10-CM | POA: Diagnosis not present

## 2013-01-23 DIAGNOSIS — M7989 Other specified soft tissue disorders: Secondary | ICD-10-CM | POA: Diagnosis not present

## 2013-01-23 DIAGNOSIS — T8454XD Infection and inflammatory reaction due to internal left knee prosthesis, subsequent encounter: Secondary | ICD-10-CM

## 2013-01-23 LAB — SEDIMENTATION RATE: Sed Rate: 8 mm/hr (ref 0–16)

## 2013-01-23 NOTE — Progress Notes (Signed)
Patient ID: [name], male   DOB: [birth year], 79 y.o.   MRN: [id number]         [facility] for Infectious Disease  Patient Active Problem List   Diagnosis Date Noted  . Infection of prosthetic left knee joint 10/09/2012    Priority: High  . Long term (current) use of anticoagulants 10/06/2011  . Hypoxia 10/03/2011  . Pulmonary embolism 09/28/2011  . Overweight(278.02) 04/28/2011  . Preop cardiovascular exam 04/28/2011  . Mixed hyperlipidemia 02/24/2010  . CAD, NATIVE VESSEL 09/28/2009  . DIZZINESS 09/28/2009  . PROSTATE CANCER 09/24/2009  . HYPERTENSION 09/24/2009  . DVT 09/24/2009  . GERD 09/24/2009    Patient's Medications  New Prescriptions   No medications on file  Previous Medications   CIPROFLOXACIN (CIPRO) 750 MG TABLET    Take 1 tablet (750 mg total) by mouth 2 (two) times daily.   CYANOCOBALAMIN (B-12) 1000 MCG CAPS    Take 1 tablet by mouth daily.    DIPHENHYDRAMINE-APAP, SLEEP, (EXCEDRIN PM PO)    Take 1 tablet by mouth at bedtime as needed (pain/sleep).    DOXYCYCLINE (VIBRA-TABS) 100 MG TABLET    Take 1 tablet (100 mg total) by mouth 2 (two) times daily.   ESOMEPRAZOLE (NEXIUM) 40 MG CAPSULE    Take 40 mg by mouth daily before breakfast.    FENOFIBRATE 160 MG TABLET    Take 160 mg by mouth daily.   METHOCARBAMOL (ROBAXIN) 500 MG TABLET    Take 1 tablet (500 mg total) by mouth every 6 (six) hours as needed.   MISC NATURAL PRODUCTS (GLUCOSAMINE CHONDROITIN ADV PO)    Take 1 tablet by mouth daily.    MULTIPLE VITAMIN (MULTIVITAMIN) CAPSULE    Take 1 capsule by mouth daily.    NITROGLYCERIN (NITROSTAT) 0.4 MG SL TABLET    Place 1 tablet (0.4 mg total) under the tongue every 5 (five) minutes as needed.   OMEGA-3 FATTY ACIDS (FISH OIL CONCENTRATE PO)    Take 2 capsules by mouth daily.    OVER THE COUNTER MEDICATION    Take 1 capsule by mouth daily. STOOL SOFTNER DAILY   PRAVASTATIN (PRAVACHOL) 20 MG TABLET    TAKE 1 TABLET EVERY DAY   RIFAMPIN (RIFADIN)  300 MG CAPSULE    TAKE 1 CAPSULE (300 MG TOTAL) BY MOUTH EVERY 12 (TWELVE) HOURS.   WARFARIN (COUMADIN) 5 MG TABLET    Take as directed by coumadin clinic  Modified Medications   No medications on file  Discontinued Medications   ENOXAPARIN (LOVENOX) 100 MG/ML INJECTION    Inject 1 mL (100 mg total) into the skin every 12 (twelve) hours.   OXYCODONE (OXY IR/ROXICODONE) 5 MG IMMEDIATE RELEASE TABLET    Take 1-2 tablets (5-10 mg total) by mouth every 3 (three) hours as needed.   TRAMADOL (ULTRAM) 50 MG TABLET    Take 1-2 tablets (50-100 mg total) by mouth every 6 (six) hours as needed.    Subjective: [name] is in for his routine followup visit. He continues on his oral ciprofloxacin, doxycycline and rifampin for his culture negative left prosthetic knee infection. He had been on antibiotics prior to his nose incision and drainage and poly-exchange procedure on September 3 which probably accounts for the negative cultures. He is feeling much better and has no left knee pain. He is back in the gym working out. He is not having any difficulties tolerating his antibiotics.  Review of Systems: Pertinent items are noted in  HPI.  Past Medical History  Diagnosis Date  . CAD, NATIVE VESSEL     PCI and DES to circumflex  . DVT 2006  . Mixed hyperlipidemia   . PROSTATE CANCER   . GERD   . Prostate cancer     s/p mutliple urologic procedures  . Pulmonary embolus 2013    LIFETIME ANTICOAGULATION BECAUSE OF PE AND PAST HX OF DVT  . MI (myocardial infarction) 08/25/09  . HYPERTENSION     PT DENIES HAVING HYPERTENSION  . Urinary incontinence     MULTIPLE BLADDER SURGERIES - STATES NO URINARY SPHINCTER - PT'S UROLOGIST IS AT [facility]- DR. PETERSON  ( LAST VISIT WAS 09/15/11 )    History  Substance Use Topics  . Smoking status: Former Smoker    Quit date: 04/27/1961  . Smokeless tobacco: Not on file  . Alcohol Use: No    No family history on file.  Allergies  Allergen Reactions  .  Darifenacin Hydrobromide Er Nausea Only    Dizziness    Objective: Temp: 97.8 F (36.6 C) (12/18 0929) Temp src: Oral (12/18 0929) BP: 129/75 mmHg (12/18 0929) Pulse Rate: 79 (12/18 0929)  General: He is in very good spirits Skin: No rash Left knee: His incision is healed nicely. He has excellent range of motion with no significant swelling or warmth  Lab Results  Component Value Date   CRP 3.3* 10/23/2012   Lab Results  Component Value Date   ESRSEDRATE 27* 10/23/2012      Assessment: He is doing very well on therapy for prosthetic knee infection. I will extend his total and about therapy to 6 months.  Plan: 1. Continue current antibiotics for 2-1/2 more months 2. Followup in early March   Cliffton Asters, MD [facility] [phone] pager   [phone] cell 01/23/2013, 10:08 AM

## 2013-01-24 ENCOUNTER — Other Ambulatory Visit: Payer: Self-pay | Admitting: Internal Medicine

## 2013-01-24 ENCOUNTER — Other Ambulatory Visit: Payer: Self-pay | Admitting: Licensed Clinical Social Worker

## 2013-01-24 DIAGNOSIS — T8454XD Infection and inflammatory reaction due to internal left knee prosthesis, subsequent encounter: Secondary | ICD-10-CM

## 2013-01-24 MED ORDER — RIFAMPIN 300 MG PO CAPS: ORAL_CAPSULE | ORAL | Status: DC

## 2013-01-24 MED ORDER — CIPROFLOXACIN HCL 750 MG PO TABS: 750.0000 mg | ORAL_TABLET | Freq: Two times a day (BID) | ORAL | Status: DC

## 2013-01-24 MED ORDER — DOXYCYCLINE HYCLATE 100 MG PO TABS: 100.0000 mg | ORAL_TABLET | Freq: Two times a day (BID) | ORAL | Status: DC

## 2013-01-27 ENCOUNTER — Ambulatory Visit (INDEPENDENT_AMBULATORY_CARE_PROVIDER_SITE_OTHER): Payer: Medicare Other

## 2013-01-27 DIAGNOSIS — I2699 Other pulmonary embolism without acute cor pulmonale: Secondary | ICD-10-CM

## 2013-01-27 DIAGNOSIS — Z7901 Long term (current) use of anticoagulants: Secondary | ICD-10-CM

## 2013-01-27 DIAGNOSIS — I82409 Acute embolism and thrombosis of unspecified deep veins of unspecified lower extremity: Secondary | ICD-10-CM | POA: Diagnosis not present

## 2013-01-28 DIAGNOSIS — Z96659 Presence of unspecified artificial knee joint: Secondary | ICD-10-CM | POA: Diagnosis not present

## 2013-01-28 DIAGNOSIS — T8450XA Infection and inflammatory reaction due to unspecified internal joint prosthesis, initial encounter: Secondary | ICD-10-CM | POA: Diagnosis not present

## 2013-02-23 ENCOUNTER — Other Ambulatory Visit: Payer: Self-pay | Admitting: Internal Medicine

## 2013-02-23 DIAGNOSIS — M009 Pyogenic arthritis, unspecified: Secondary | ICD-10-CM

## 2013-02-24 ENCOUNTER — Ambulatory Visit (INDEPENDENT_AMBULATORY_CARE_PROVIDER_SITE_OTHER): Payer: Medicare Other | Admitting: Pharmacist

## 2013-02-24 DIAGNOSIS — Z7901 Long term (current) use of anticoagulants: Secondary | ICD-10-CM

## 2013-02-24 DIAGNOSIS — Z5181 Encounter for therapeutic drug level monitoring: Secondary | ICD-10-CM

## 2013-02-24 DIAGNOSIS — I82409 Acute embolism and thrombosis of unspecified deep veins of unspecified lower extremity: Secondary | ICD-10-CM

## 2013-02-24 DIAGNOSIS — I2699 Other pulmonary embolism without acute cor pulmonale: Secondary | ICD-10-CM | POA: Diagnosis not present

## 2013-02-24 LAB — POCT INR: INR: 1.9

## 2013-03-24 ENCOUNTER — Ambulatory Visit (INDEPENDENT_AMBULATORY_CARE_PROVIDER_SITE_OTHER): Payer: Medicare Other | Admitting: *Deleted

## 2013-03-24 DIAGNOSIS — I82409 Acute embolism and thrombosis of unspecified deep veins of unspecified lower extremity: Secondary | ICD-10-CM

## 2013-03-24 DIAGNOSIS — Z5181 Encounter for therapeutic drug level monitoring: Secondary | ICD-10-CM | POA: Diagnosis not present

## 2013-03-24 DIAGNOSIS — Z7901 Long term (current) use of anticoagulants: Secondary | ICD-10-CM | POA: Diagnosis not present

## 2013-03-24 DIAGNOSIS — I2699 Other pulmonary embolism without acute cor pulmonale: Secondary | ICD-10-CM | POA: Diagnosis not present

## 2013-03-24 LAB — POCT INR: INR: 1.8

## 2013-04-07 ENCOUNTER — Ambulatory Visit (INDEPENDENT_AMBULATORY_CARE_PROVIDER_SITE_OTHER): Payer: Medicare Other | Admitting: Pharmacist

## 2013-04-07 DIAGNOSIS — Z5181 Encounter for therapeutic drug level monitoring: Secondary | ICD-10-CM

## 2013-04-07 DIAGNOSIS — I2699 Other pulmonary embolism without acute cor pulmonale: Secondary | ICD-10-CM

## 2013-04-07 DIAGNOSIS — Z7901 Long term (current) use of anticoagulants: Secondary | ICD-10-CM

## 2013-04-07 DIAGNOSIS — I82409 Acute embolism and thrombosis of unspecified deep veins of unspecified lower extremity: Secondary | ICD-10-CM | POA: Diagnosis not present

## 2013-04-07 LAB — POCT INR: INR: 1.7

## 2013-04-21 ENCOUNTER — Other Ambulatory Visit: Payer: Self-pay | Admitting: *Deleted

## 2013-04-21 MED ORDER — PRAVASTATIN SODIUM 20 MG PO TABS: ORAL_TABLET | ORAL | Status: DC

## 2013-04-22 ENCOUNTER — Ambulatory Visit (INDEPENDENT_AMBULATORY_CARE_PROVIDER_SITE_OTHER): Payer: Medicare Other | Admitting: Internal Medicine

## 2013-04-22 VITALS — BP 110/73 | HR 89 | Wt 245.8 lb

## 2013-04-22 DIAGNOSIS — T8454XA Infection and inflammatory reaction due to internal left knee prosthesis, initial encounter: Secondary | ICD-10-CM

## 2013-04-22 DIAGNOSIS — T8450XA Infection and inflammatory reaction due to unspecified internal joint prosthesis, initial encounter: Secondary | ICD-10-CM

## 2013-04-22 DIAGNOSIS — Z96659 Presence of unspecified artificial knee joint: Secondary | ICD-10-CM | POA: Diagnosis not present

## 2013-04-22 LAB — C-REACTIVE PROTEIN

## 2013-04-22 NOTE — Progress Notes (Addendum)
Patient ID: [name], male   DOB: 07/13/1931, 80 y.o.   MRN: [id number]         [facility] for Infectious Disease  Patient Active Problem List   Diagnosis Date Noted  . Infection of prosthetic left knee joint 10/09/2012    Priority: High  . Encounter for therapeutic drug monitoring 03/24/2013  . Long term (current) use of anticoagulants 10/06/2011  . Hypoxia 10/03/2011  . Pulmonary embolism 09/28/2011  . Overweight 04/28/2011  . Preop cardiovascular exam 04/28/2011  . Mixed hyperlipidemia 02/24/2010  . CAD, NATIVE VESSEL 09/28/2009  . DIZZINESS 09/28/2009  . PROSTATE CANCER 09/24/2009  . HYPERTENSION 09/24/2009  . DVT 09/24/2009  . GERD 09/24/2009    Patient's Medications  New Prescriptions   No medications on file  Previous Medications   CIPROFLOXACIN (CIPRO) 750 MG TABLET    TAKE 1 TABLET BY MOUTH TWICE A DAY   CYANOCOBALAMIN (B-12) 1000 MCG CAPS    Take 1 tablet by mouth daily.    DIPHENHYDRAMINE-APAP, SLEEP, (EXCEDRIN PM PO)    Take 1 tablet by mouth at bedtime as needed (pain/sleep).    DOXYCYCLINE (VIBRA-TABS) 100 MG TABLET    Take 1 tablet (100 mg total) by mouth 2 (two) times daily.   ESOMEPRAZOLE (NEXIUM) 40 MG CAPSULE    Take 40 mg by mouth daily before breakfast.    FENOFIBRATE 160 MG TABLET    Take 160 mg by mouth daily.   MISC NATURAL PRODUCTS (GLUCOSAMINE CHONDROITIN ADV PO)    Take 1 tablet by mouth daily.    MULTIPLE VITAMIN (MULTIVITAMIN) CAPSULE    Take 1 capsule by mouth daily.    NITROGLYCERIN (NITROSTAT) 0.4 MG SL TABLET    Place 1 tablet (0.4 mg total) under the tongue every 5 (five) minutes as needed.   OMEGA-3 FATTY ACIDS (FISH OIL CONCENTRATE PO)    Take 2 capsules by mouth daily.    PRAVASTATIN (PRAVACHOL) 20 MG TABLET    TAKE 1 TABLET EVERY DAY   RIFAMPIN (RIFADIN) 300 MG CAPSULE    TAKE ONE CAPSULE BY MOUTH EVERY 12 HOURS   WARFARIN (COUMADIN) 5 MG TABLET    Take as directed by coumadin clinic  Modified Medications   No medications  on file  Discontinued Medications   METHOCARBAMOL (ROBAXIN) 500 MG TABLET    Take 1 tablet (500 mg total) by mouth every 6 (six) hours as needed.   OVER THE COUNTER MEDICATION    Take 1 capsule by mouth daily. STOOL SOFTNER DAILY    Subjective: [name] is in for his routine followup visit. He has now completed 6 months of antibiotic therapy for culture-negative left prosthetic knee infection. He has a little bit of acid indigestion that he thinks is related to his ciprofloxacin, doxycycline and rifampin but otherwise has tolerated his medication well. His left knee is feeling much better. He has minimal pain and has not required any pain medication. He is working out at [employer] 3 times each week.  Review of Systems: Pertinent items are noted in HPI.  Past Medical History  Diagnosis Date  . CAD, NATIVE VESSEL     PCI and DES to circumflex  . DVT 2006  . Mixed hyperlipidemia   . PROSTATE CANCER   . GERD   . Prostate cancer     s/p mutliple urologic procedures  . Pulmonary embolus 2013    LIFETIME ANTICOAGULATION BECAUSE OF PE AND PAST HX OF DVT  . MI (myocardial  infarction) 08/25/09  . HYPERTENSION     PT DENIES HAVING HYPERTENSION  . Urinary incontinence     MULTIPLE BLADDER SURGERIES - STATES NO URINARY SPHINCTER - PT'S UROLOGIST IS AT [facility]- DR. PETERSON  ( LAST VISIT WAS 09/15/11 )    History  Substance Use Topics  . Smoking status: Former Smoker    Quit date: 04/27/1961  . Smokeless tobacco: Not on file  . Alcohol Use: No    No family history on file.  Allergies  Allergen Reactions  . Darifenacin Hydrobromide Er Nausea Only    Dizziness    Objective: BP: 110/73 mmHg (03/17 1027) Pulse Rate: 89 (03/17 1027)  General: He is in good spirits Skin: No rash Lungs: Clear Cor: Regular S1 and S2 no murmurs Abdomen: Obese, soft and nontender Joints and extremities: Left knee without significant swelling, redness, warmth or pain with range of motion  Lab  Results Lab Results  Component Value Date   CRP <0.5 01/23/2013   Lab Results  Component Value Date   ESRSEDRATE 8 01/23/2013     Assessment: He is doing much better. I will repeat his inflammatory markers and then make a decision tomorrow about stopping his antibiotics. We'll stop antibiotics he will need close followup of his INR to account for any potential drug drug interactions between his antibiotics and Coumadin.  Plan: 1. Check repeat sedimentation rate and C-reactive protein 2. I will call him at home tomorrow to review results and determine if it is time to stop antibiotics   Noah Bickers, MD [facility] for [place] [phone] pager   [phone] cell 04/22/2013, 10:54 AM  Addendum:  Lab Results  Component Value Date   CRP <0.5 04/22/2013   Lab Results  Component Value Date   ESRSEDRATE 4 04/22/2013   He is now completed 6 months of antibiotic therapy after washout of his left prosthetic knee. His inflammatory markers have been normal on his last 2 visits. He will stop his antibiotic therapy now. He is due to followup in Coumadin clinic later today.  Noah Bickers, MD [facility] for [place] [facility] [phone] pager   [phone] cell 04/23/2013, 9:52 AM

## 2013-04-23 ENCOUNTER — Ambulatory Visit (INDEPENDENT_AMBULATORY_CARE_PROVIDER_SITE_OTHER): Payer: Medicare Other | Admitting: Pharmacist

## 2013-04-23 DIAGNOSIS — I82409 Acute embolism and thrombosis of unspecified deep veins of unspecified lower extremity: Secondary | ICD-10-CM

## 2013-04-23 DIAGNOSIS — I2699 Other pulmonary embolism without acute cor pulmonale: Secondary | ICD-10-CM

## 2013-04-23 DIAGNOSIS — Z7901 Long term (current) use of anticoagulants: Secondary | ICD-10-CM | POA: Diagnosis not present

## 2013-04-23 DIAGNOSIS — Z5181 Encounter for therapeutic drug level monitoring: Secondary | ICD-10-CM | POA: Diagnosis not present

## 2013-04-23 LAB — SEDIMENTATION RATE: Sed Rate: 4 mm/hr (ref 0–16)

## 2013-04-29 ENCOUNTER — Ambulatory Visit (INDEPENDENT_AMBULATORY_CARE_PROVIDER_SITE_OTHER): Payer: Medicare Other

## 2013-04-29 DIAGNOSIS — I82409 Acute embolism and thrombosis of unspecified deep veins of unspecified lower extremity: Secondary | ICD-10-CM | POA: Diagnosis not present

## 2013-04-29 DIAGNOSIS — Z7901 Long term (current) use of anticoagulants: Secondary | ICD-10-CM | POA: Diagnosis not present

## 2013-04-29 DIAGNOSIS — Z5181 Encounter for therapeutic drug level monitoring: Secondary | ICD-10-CM | POA: Diagnosis not present

## 2013-04-29 DIAGNOSIS — Z96659 Presence of unspecified artificial knee joint: Secondary | ICD-10-CM | POA: Diagnosis not present

## 2013-04-29 DIAGNOSIS — I2699 Other pulmonary embolism without acute cor pulmonale: Secondary | ICD-10-CM

## 2013-04-29 DIAGNOSIS — T8450XA Infection and inflammatory reaction due to unspecified internal joint prosthesis, initial encounter: Secondary | ICD-10-CM | POA: Diagnosis not present

## 2013-04-29 LAB — POCT INR: INR: 1.2

## 2013-05-06 ENCOUNTER — Ambulatory Visit (INDEPENDENT_AMBULATORY_CARE_PROVIDER_SITE_OTHER): Payer: Medicare Other

## 2013-05-06 DIAGNOSIS — Z7901 Long term (current) use of anticoagulants: Secondary | ICD-10-CM | POA: Diagnosis not present

## 2013-05-06 DIAGNOSIS — I2699 Other pulmonary embolism without acute cor pulmonale: Secondary | ICD-10-CM

## 2013-05-06 DIAGNOSIS — I82409 Acute embolism and thrombosis of unspecified deep veins of unspecified lower extremity: Secondary | ICD-10-CM | POA: Diagnosis not present

## 2013-05-06 DIAGNOSIS — Z5181 Encounter for therapeutic drug level monitoring: Secondary | ICD-10-CM

## 2013-05-06 LAB — POCT INR: INR: 2.4

## 2013-05-13 ENCOUNTER — Ambulatory Visit (INDEPENDENT_AMBULATORY_CARE_PROVIDER_SITE_OTHER): Payer: Medicare Other | Admitting: Pharmacist

## 2013-05-13 DIAGNOSIS — I2699 Other pulmonary embolism without acute cor pulmonale: Secondary | ICD-10-CM | POA: Diagnosis not present

## 2013-05-13 DIAGNOSIS — Z7901 Long term (current) use of anticoagulants: Secondary | ICD-10-CM | POA: Diagnosis not present

## 2013-05-13 DIAGNOSIS — Z5181 Encounter for therapeutic drug level monitoring: Secondary | ICD-10-CM | POA: Diagnosis not present

## 2013-05-13 DIAGNOSIS — I82409 Acute embolism and thrombosis of unspecified deep veins of unspecified lower extremity: Secondary | ICD-10-CM

## 2013-05-13 LAB — POCT INR: INR: 4.6

## 2013-05-21 ENCOUNTER — Telehealth: Payer: Self-pay | Admitting: Pharmacist

## 2013-05-21 ENCOUNTER — Ambulatory Visit (INDEPENDENT_AMBULATORY_CARE_PROVIDER_SITE_OTHER): Payer: Medicare Other | Admitting: Pharmacist

## 2013-05-21 DIAGNOSIS — Z5181 Encounter for therapeutic drug level monitoring: Secondary | ICD-10-CM

## 2013-05-21 DIAGNOSIS — I2699 Other pulmonary embolism without acute cor pulmonale: Secondary | ICD-10-CM | POA: Diagnosis not present

## 2013-05-21 DIAGNOSIS — I82409 Acute embolism and thrombosis of unspecified deep veins of unspecified lower extremity: Secondary | ICD-10-CM | POA: Diagnosis not present

## 2013-05-21 DIAGNOSIS — Z7901 Long term (current) use of anticoagulants: Secondary | ICD-10-CM

## 2013-05-21 LAB — PROTIME-INR
INR: 8.2 ratio (ref 0.8–1.0)
PROTHROMBIN TIME: 82.8 s — AB (ref 10.2–12.4)

## 2013-05-21 LAB — POCT INR: INR: 6.8

## 2013-05-21 NOTE — Telephone Encounter (Signed)
Called patient's home and left message with wife--INR was 8.2 from venipuncture. He is to hold Coumadin for 4 days, then on Sunday, start taking 1/2 tablet every day except for 1 whole tablet on Mondays and Fridays. Explained that Rifampin is finally completely out of his system which is likely why INR is so high. Asked his wife to have him call us back to schedule an appointment for no later than 1 week from today. Provided our telephone number and instructed her to call us if either of them have any questions.

## 2013-05-23 DIAGNOSIS — R059 Cough, unspecified: Secondary | ICD-10-CM | POA: Diagnosis not present

## 2013-05-23 DIAGNOSIS — R0602 Shortness of breath: Secondary | ICD-10-CM | POA: Diagnosis not present

## 2013-05-23 DIAGNOSIS — R05 Cough: Secondary | ICD-10-CM | POA: Diagnosis not present

## 2013-05-28 ENCOUNTER — Ambulatory Visit (INDEPENDENT_AMBULATORY_CARE_PROVIDER_SITE_OTHER): Payer: Medicare Other | Admitting: *Deleted

## 2013-05-28 DIAGNOSIS — I82409 Acute embolism and thrombosis of unspecified deep veins of unspecified lower extremity: Secondary | ICD-10-CM | POA: Diagnosis not present

## 2013-05-28 DIAGNOSIS — Z5181 Encounter for therapeutic drug level monitoring: Secondary | ICD-10-CM

## 2013-05-28 DIAGNOSIS — I2699 Other pulmonary embolism without acute cor pulmonale: Secondary | ICD-10-CM

## 2013-05-28 LAB — POCT INR: INR: 1.4

## 2013-06-03 ENCOUNTER — Ambulatory Visit (INDEPENDENT_AMBULATORY_CARE_PROVIDER_SITE_OTHER): Payer: Medicare Other | Admitting: Internal Medicine

## 2013-06-03 ENCOUNTER — Encounter: Payer: Self-pay | Admitting: Internal Medicine

## 2013-06-03 VITALS — BP 157/94 | HR 73 | Temp 97.8°F | Ht 72.0 in | Wt 244.8 lb

## 2013-06-03 DIAGNOSIS — M7989 Other specified soft tissue disorders: Secondary | ICD-10-CM | POA: Diagnosis not present

## 2013-06-03 DIAGNOSIS — Z96659 Presence of unspecified artificial knee joint: Secondary | ICD-10-CM | POA: Diagnosis not present

## 2013-06-03 DIAGNOSIS — T8454XA Infection and inflammatory reaction due to internal left knee prosthesis, initial encounter: Secondary | ICD-10-CM

## 2013-06-03 DIAGNOSIS — Z5189 Encounter for other specified aftercare: Secondary | ICD-10-CM | POA: Diagnosis not present

## 2013-06-03 DIAGNOSIS — T8450XA Infection and inflammatory reaction due to unspecified internal joint prosthesis, initial encounter: Secondary | ICD-10-CM | POA: Diagnosis not present

## 2013-06-03 NOTE — Progress Notes (Signed)
Patient ID: [name], male   DOB: [birth year], 80 y.o.   MRN: [id number]         [facility] for Infectious Disease  Patient Active Problem List   Diagnosis Date Noted  . Infection of prosthetic left knee joint 10/09/2012    Priority: High  . Encounter for therapeutic drug monitoring 03/24/2013  . Long term (current) use of anticoagulants 10/06/2011  . Hypoxia 10/03/2011  . Pulmonary embolism 09/28/2011  . Overweight 04/28/2011  . Preop cardiovascular exam 04/28/2011  . Mixed hyperlipidemia 02/24/2010  . CAD, NATIVE VESSEL 09/28/2009  . DIZZINESS 09/28/2009  . PROSTATE CANCER 09/24/2009  . HYPERTENSION 09/24/2009  . DVT 09/24/2009  . GERD 09/24/2009    Patient's Medications  New Prescriptions   No medications on file  Previous Medications   CYANOCOBALAMIN (B-12) 1000 MCG CAPS    Take 1 tablet by mouth daily.    DIPHENHYDRAMINE-APAP, SLEEP, (EXCEDRIN PM PO)    Take 1 tablet by mouth at bedtime as needed (pain/sleep).    ESOMEPRAZOLE (NEXIUM) 40 MG CAPSULE    Take 40 mg by mouth daily before breakfast.    FENOFIBRATE 160 MG TABLET    Take 160 mg by mouth daily.   MISC NATURAL PRODUCTS (GLUCOSAMINE CHONDROITIN ADV PO)    Take 1 tablet by mouth daily.    MULTIPLE VITAMIN (MULTIVITAMIN) CAPSULE    Take 1 capsule by mouth daily.    NITROGLYCERIN (NITROSTAT) 0.4 MG SL TABLET    Place 1 tablet (0.4 mg total) under the tongue every 5 (five) minutes as needed.   OMEGA-3 FATTY ACIDS (FISH OIL CONCENTRATE PO)    Take 2 capsules by mouth daily.    PRAVASTATIN (PRAVACHOL) 20 MG TABLET    TAKE 1 TABLET EVERY DAY   WARFARIN (COUMADIN) 5 MG TABLET    Take as directed by coumadin clinic  Modified Medications   No medications on file  Discontinued Medications   CIPROFLOXACIN (CIPRO) 750 MG TABLET    TAKE 1 TABLET BY MOUTH TWICE A DAY   DOXYCYCLINE (VIBRA-TABS) 100 MG TABLET    Take 1 tablet (100 mg total) by mouth 2 (two) times daily.   RIFAMPIN (RIFADIN) 300 MG CAPSULE    TAKE  ONE CAPSULE BY MOUTH EVERY 12 HOURS    Subjective: [name] is in for his routine followup visit. [name] completed 6 weeks of antibiotic therapy for culture-negative left prosthetic knee infection one month ago. [name] states that [name] is feeling much better other than some dyspnea on exertion. [name] is not having any significant knee pain or swelling. [name] is exercising and riding his bike.  Review of Systems: Pertinent items are noted in HPI.  Past Medical History  Diagnosis Date  . CAD, NATIVE VESSEL     PCI and DES to circumflex  . DVT 2006  . Mixed hyperlipidemia   . PROSTATE CANCER   . GERD   . Prostate cancer     s/p mutliple urologic procedures  . Pulmonary embolus 2013    LIFETIME ANTICOAGULATION BECAUSE OF PE AND PAST HX OF DVT  . MI (myocardial infarction) 08/25/09  . HYPERTENSION     PT DENIES HAVING HYPERTENSION  . Urinary incontinence     MULTIPLE BLADDER SURGERIES - STATES NO URINARY SPHINCTER - PT'S UROLOGIST IS AT [facility]- DR. PETERSON  ( LAST VISIT WAS 09/15/11 )    History  Substance Use Topics  . Smoking status: Former Smoker    Quit date: 04/27/1961  .  Smokeless tobacco: Not on file  . Alcohol Use: No    No family history on file.  Allergies  Allergen Reactions  . Darifenacin Hydrobromide Er Nausea Only    Dizziness    Objective: Temp: 97.8 F (36.6 C) (04/28 1036) Temp src: Oral (04/28 1036) BP: 157/94 mmHg (04/28 1036) Pulse Rate: 73 (04/28 1036)  General: [name] is in very good spirits Left knee: His incision is healed and [name] has no evidence of active infection.   Assessment: I am very hopeful that his prosthetic knee infection has been cured.  Plan: 1. Continue observation off of antibiotics 2. [name] is to call me if [name] has any signs of relapse   Noah Bickers, MD [facility] for [place] [phone] pager   [phone] cell 06/03/2013, 10:42 AM

## 2013-06-06 ENCOUNTER — Ambulatory Visit (INDEPENDENT_AMBULATORY_CARE_PROVIDER_SITE_OTHER): Payer: Medicare Other

## 2013-06-06 ENCOUNTER — Encounter (INDEPENDENT_AMBULATORY_CARE_PROVIDER_SITE_OTHER): Payer: Self-pay

## 2013-06-06 ENCOUNTER — Telehealth: Payer: Self-pay | Admitting: *Deleted

## 2013-06-06 ENCOUNTER — Ambulatory Visit (INDEPENDENT_AMBULATORY_CARE_PROVIDER_SITE_OTHER): Payer: Medicare Other | Admitting: Physician Assistant

## 2013-06-06 ENCOUNTER — Encounter: Payer: Self-pay | Admitting: Physician Assistant

## 2013-06-06 VITALS — BP 140/82 | HR 62 | Ht 72.0 in | Wt 248.4 lb

## 2013-06-06 DIAGNOSIS — Z5181 Encounter for therapeutic drug level monitoring: Secondary | ICD-10-CM | POA: Diagnosis not present

## 2013-06-06 DIAGNOSIS — Z7901 Long term (current) use of anticoagulants: Secondary | ICD-10-CM | POA: Diagnosis not present

## 2013-06-06 DIAGNOSIS — I2699 Other pulmonary embolism without acute cor pulmonale: Secondary | ICD-10-CM

## 2013-06-06 DIAGNOSIS — I10 Essential (primary) hypertension: Secondary | ICD-10-CM | POA: Diagnosis not present

## 2013-06-06 DIAGNOSIS — I82409 Acute embolism and thrombosis of unspecified deep veins of unspecified lower extremity: Secondary | ICD-10-CM

## 2013-06-06 DIAGNOSIS — I440 Atrioventricular block, first degree: Secondary | ICD-10-CM

## 2013-06-06 DIAGNOSIS — I2510 Atherosclerotic heart disease of native coronary artery without angina pectoris: Secondary | ICD-10-CM

## 2013-06-06 DIAGNOSIS — R0602 Shortness of breath: Secondary | ICD-10-CM

## 2013-06-06 DIAGNOSIS — I350 Nonrheumatic aortic (valve) stenosis: Secondary | ICD-10-CM

## 2013-06-06 DIAGNOSIS — I359 Nonrheumatic aortic valve disorder, unspecified: Secondary | ICD-10-CM | POA: Diagnosis not present

## 2013-06-06 DIAGNOSIS — E782 Mixed hyperlipidemia: Secondary | ICD-10-CM

## 2013-06-06 LAB — POCT INR: INR: 1.9

## 2013-06-06 LAB — BASIC METABOLIC PANEL
BUN: 32 mg/dL — ABNORMAL HIGH (ref 6–23)
CO2: 26 mEq/L (ref 19–32)
Calcium: 10 mg/dL (ref 8.4–10.5)
Chloride: 107 mEq/L (ref 96–112)
Creatinine, Ser: 1.5 mg/dL (ref 0.4–1.5)
GFR: 48.58 mL/min — ABNORMAL LOW (ref >60.00)
Glucose, Bld: 87 mg/dL (ref 70–99)
POTASSIUM: 4.3 meq/L (ref 3.5–5.1)
SODIUM: 139 meq/L (ref 135–145)

## 2013-06-06 LAB — CBC WITH DIFFERENTIAL/PLATELET
Basophils Absolute: 0 10*3/uL (ref 0.0–0.1)
Basophils Relative: 0.9 % (ref 0.0–3.0)
EOS PCT: 4.5 % (ref 0.0–5.0)
Eosinophils Absolute: 0.2 10*3/uL (ref 0.0–0.7)
HEMATOCRIT: 44.1 % (ref 39.0–52.0)
Hemoglobin: 14.8 g/dL (ref 13.0–17.0)
LYMPHS ABS: 1.8 10*3/uL (ref 0.7–4.0)
LYMPHS PCT: 35.2 % (ref 12.0–46.0)
MCHC: 33.6 g/dL (ref 30.0–36.0)
MCV: 85.1 fl (ref 78.0–100.0)
MONOS PCT: 11.6 % (ref 3.0–12.0)
Monocytes Absolute: 0.6 10*3/uL (ref 0.1–1.0)
Neutro Abs: 2.4 10*3/uL (ref 1.4–7.7)
Neutrophils Relative %: 47.8 % (ref 43.0–77.0)
Platelets: 215 10*3/uL (ref 150.0–400.0)
RBC: 5.18 Mil/uL (ref 4.22–5.81)
RDW: 15.3 % — ABNORMAL HIGH (ref 11.5–14.6)
WBC: 5 10*3/uL (ref 4.5–10.5)

## 2013-06-06 LAB — BRAIN NATRIURETIC PEPTIDE: Pro B Natriuretic peptide (BNP): 310 pg/mL — ABNORMAL HIGH (ref 0.0–100.0)

## 2013-06-06 NOTE — Telephone Encounter (Signed)
Message copied by Earvin Hansen on Fri Jun 06, 2013  5:57 PM ------      Message from: [place], [place] T      Created: Fri Jun 06, 2013  2:26 PM       Creatinine ok      Hgb ok      BNP minimally elevated.  I would not start diuretics based on this.      Will see what his echo shows.      Continue with current treatment plan.      Richardson Dopp, PA-C        06/06/2013 2:26 PM ------

## 2013-06-06 NOTE — Patient Instructions (Addendum)
Will obtain labs today and call you with the results (cbc/bmet/bnp)  Your physician recommends that you continue on your current medications as directed. Please refer to the Current Medication list given to you today.  Your physician has requested that you have en exercise stress myoview. For further information please visit [email or web address]. Please follow instruction sheet, as given.  Your physician has requested that you have an echocardiogram. Echocardiography is a painless test that uses sound waves to create images of your heart. It provides your doctor with information about the size and shape of your heart and how well your heart's chambers and valves are working. This procedure takes approximately one hour. There are no restrictions for this procedure.  Your physician has recommended that you wear a holter monitor. Holter monitors are medical devices that record the heart's electrical activity. Doctors most often use these monitors to diagnose arrhythmias. Arrhythmias are problems with the speed or rhythm of the heartbeat. The monitor is a small, portable device. You can wear one while you do your normal daily activities. This is usually used to diagnose what is causing palpitations/syncope (passing out). 2 HOUR  Your physician recommends that you schedule a follow-up appointment in: 2-3 weeks with Dr Percival Spanish on [place] on a day Dr Percival Spanish is her

## 2013-06-06 NOTE — Telephone Encounter (Signed)
Advised patient of lab results  

## 2013-06-06 NOTE — Progress Notes (Signed)
[address], [address] [place], [place]  [address] Phone: [phone] Fax:  [phone]  Date:  06/06/2013   ID:  [name], DOB 1931/05/02, MRN [id number]  PCP:  Shirline Frees, MD  Cardiologist:    Dr. Minus Breeding    History of Present Illness: [name] is a 80 y.o. male  with a hx of CAD s/p NSTEMI in 2011 tx with DES to CFX, AS, prior DVT in 2006 s/p IVC filter, PE in 2013 on lifelong coumadin, HL, Prostate CA, HTN. Last seen by Dr. Minus Breeding in 12/2012.  Of note, the patient had prosthetic knee infection in 10/2012. He underwent debridement. He remained on triple antibiotics for 6 months. He recently stopped taking these in March.  These included doxycycline, ciprofloxacin and rifampin. Rifampin can cause dyspnea.  Patient has noted increased dyspnea since he started the antibiotics. Since discontinuing them, his breathing has not improved. He describes NYHA class 2b-3 symptoms. He denies orthopnea, PND or edema. He denies syncope. He denies chest pain.   Studies:  - LHC (08/2009):  Proximal LAD 30%, mid to distal LAD 25%, ostial small D1 75% mid AV groove circumflex 99% been subtotal stenosis, proximal to mid RCA 25-30%, mid RCA 30%, mid PDA 30%, EF 50% with inferior hypokinesis.  PCI:  DES to CFX.  - Echo (09/2011):  Mild LVH, EF 55-60%, Gr 1 DD, mild AS (mean 12 mmHg), mild reduced RVSF, mild RAE, lipomatous hypertrophy  - ETT (06/2012):  No ischemia   Recent Labs: 10/09/2012: ALT 8  10/11/2012: Creatinine 1.26; Hemoglobin 12.3*; Potassium 3.8   Wt Readings from Last 3 Encounters:  06/06/13 248 lb 6.4 oz (112.674 kg)  06/03/13 244 lb 12 oz (111.018 kg)  04/22/13 245 lb 12 oz (111.471 kg)     Past Medical History  Diagnosis Date  . CAD, NATIVE VESSEL     PCI and DES to circumflex  . DVT 2006  . Mixed hyperlipidemia   . PROSTATE CANCER   . GERD   . Prostate cancer     s/p mutliple urologic procedures  . Pulmonary embolus 2013    LIFETIME  ANTICOAGULATION BECAUSE OF PE AND PAST HX OF DVT  . MI (myocardial infarction) 08/25/09  . HYPERTENSION     PT DENIES HAVING HYPERTENSION  . Urinary incontinence     MULTIPLE BLADDER SURGERIES - STATES NO URINARY SPHINCTER - PT'S UROLOGIST IS AT [facility]- DR. PETERSON  ( LAST VISIT WAS 09/15/11 )    Current Outpatient Prescriptions  Medication Sig Dispense Refill  . Cyanocobalamin (B-12) 1000 MCG CAPS Take 1 tablet by mouth daily.       . Diphenhydramine-APAP, sleep, (EXCEDRIN PM PO) Take 1 tablet by mouth at bedtime as needed (pain/sleep).       Marland Kitchen esomeprazole (NEXIUM) 40 MG capsule Take 40 mg by mouth daily before breakfast.       . fenofibrate 160 MG tablet Take 160 mg by mouth daily.      . Misc Natural Products (GLUCOSAMINE CHONDROITIN ADV PO) Take 1 tablet by mouth daily.       . Multiple Vitamin (MULTIVITAMIN) capsule Take 1 capsule by mouth daily.       . nitroGLYCERIN (NITROSTAT) 0.4 MG SL tablet Place 1 tablet (0.4 mg total) under the tongue every 5 (five) minutes as needed.  25 tablet  8  . Omega-3 Fatty Acids (FISH OIL CONCENTRATE PO) Take 2 capsules by mouth daily.       Marland Kitchen  pravastatin (PRAVACHOL) 20 MG tablet TAKE 1 TABLET EVERY DAY  30 tablet  7  . warfarin (COUMADIN) 5 MG tablet Take as directed by coumadin clinic  80 tablet  3   No current facility-administered medications for this visit.    Allergies:   Darifenacin hydrobromide er   Social History:  The patient  reports that he quit smoking about 52 years ago. He does not have any smokeless tobacco history on file. He reports that he does not drink alcohol or use illicit drugs.   Family History:  The patient's family history is not on file.   ROS:  Please see the history of present illness.   He has a chronic cough.  He occ wheezes.   All other systems reviewed and negative.   PHYSICAL EXAM: VS:  BP 140/82  Pulse 62  Ht 6' (1.829 m)  Wt 248 lb 6.4 oz (112.674 kg)  BMI 33.68 kg/m2 Well nourished, well developed, in no  acute distress HEENT: normal Neck: no JVD Cardiac:  normal S1, S2; RRR; 2/6 crescendo/decrescendo systolic murmur at the RUSB Lungs:  clear to auscultation bilaterally, no wheezing, rhonchi or rales Abd: soft, nontender, no hepatomegaly Ext: no edema Skin: warm and dry Neuro:  CNs 2-12 intact, no focal abnormalities noted   EKG:  NSR, HR 71, normal axis, first-degree AV block (PR 372 ms), no ST changes      ASSESSMENT AND PLAN:  1. Shortness of breath:  Etiology unclear. This may be related to the antibiotics (Rifampin can cause dyspnea). I suppose it will take some time for those side effects to improve given the longevity of his treatment. However, he does have a history of CAD. Question if this is an anginal equivalent.  I will obtain an exercise Myoview to rule out ischemia. He also has a history of aortic stenosis. It has been 2 years since his last echocardiogram. On exam, it does not sound as though his aortic stenosis has significantly worsened. I will obtain a follow up echocardiogram. He also has a very long first degree AV block. This appears to be longer than previous measurements. He does not have any syncope. I will have him undergo a 24-hour Holter to rule out significant bradycardia arrhythmias or pauses. Also check basic metabolic panel, CBC and BNP. 2. 1St degree AV block:  Obtain 24 Holter as noted.  3. Coronary atherosclerosis of native coronary artery:  He is not on ASA as he is on coumadin.  Continue statin.  Obtain myoview as noted.  4. Aortic stenosis:  Obtain f/u echo. 5. Essential hypertension, benign:  Controlled.  Check BMET. 6. Pulmonary embolism:  INRs have been labile with recent antibiotic Rx.  However, most recent INR was 1.9.  His symptoms do not sound consistent with recurrent PE.  Will see if his echo shows any RV dysfunction.  Consider Chest CTA if this is evident. 7. Mixed hyperlipidemia:  Continue statin. 8. Disposition:  F/u with Dr. Minus Breeding or me  in 2-3 weeks.   Signed, Richardson Dopp, PA-C  06/06/2013 8:33 AM

## 2013-06-09 ENCOUNTER — Telehealth: Payer: Self-pay | Admitting: *Deleted

## 2013-06-09 MED ORDER — WARFARIN SODIUM 5 MG PO TABS: ORAL_TABLET | ORAL | Status: DC

## 2013-06-09 NOTE — Telephone Encounter (Signed)
[facility] requests coumadin refill for patient. Thanks, MI

## 2013-06-18 DIAGNOSIS — R0602 Shortness of breath: Secondary | ICD-10-CM | POA: Diagnosis not present

## 2013-06-23 ENCOUNTER — Encounter (INDEPENDENT_AMBULATORY_CARE_PROVIDER_SITE_OTHER): Payer: Medicare Other

## 2013-06-23 ENCOUNTER — Other Ambulatory Visit: Payer: Self-pay | Admitting: *Deleted

## 2013-06-23 ENCOUNTER — Ambulatory Visit: Payer: Medicare Other | Admitting: Cardiology

## 2013-06-23 ENCOUNTER — Encounter: Payer: Self-pay | Admitting: *Deleted

## 2013-06-23 DIAGNOSIS — I350 Nonrheumatic aortic (valve) stenosis: Secondary | ICD-10-CM

## 2013-06-23 DIAGNOSIS — I440 Atrioventricular block, first degree: Secondary | ICD-10-CM | POA: Diagnosis not present

## 2013-06-23 NOTE — Progress Notes (Signed)
Patient ID: [name], male   DOB: 1931-09-30, 80 y.o.   MRN: [id number] EVO 24 hour holter monitor applied to patient.

## 2013-06-24 ENCOUNTER — Ambulatory Visit (HOSPITAL_BASED_OUTPATIENT_CLINIC_OR_DEPARTMENT_OTHER): Payer: Medicare Other | Admitting: Radiology

## 2013-06-24 ENCOUNTER — Encounter: Payer: Self-pay | Admitting: Physician Assistant

## 2013-06-24 ENCOUNTER — Telehealth: Payer: Self-pay | Admitting: *Deleted

## 2013-06-24 ENCOUNTER — Ambulatory Visit (HOSPITAL_COMMUNITY): Payer: Medicare Other | Attending: Cardiovascular Disease | Admitting: Cardiology

## 2013-06-24 ENCOUNTER — Ambulatory Visit (INDEPENDENT_AMBULATORY_CARE_PROVIDER_SITE_OTHER): Payer: Medicare Other

## 2013-06-24 VITALS — Ht 72.0 in | Wt 246.0 lb

## 2013-06-24 DIAGNOSIS — I2510 Atherosclerotic heart disease of native coronary artery without angina pectoris: Secondary | ICD-10-CM | POA: Insufficient documentation

## 2013-06-24 DIAGNOSIS — I2699 Other pulmonary embolism without acute cor pulmonale: Secondary | ICD-10-CM | POA: Diagnosis not present

## 2013-06-24 DIAGNOSIS — R0989 Other specified symptoms and signs involving the circulatory and respiratory systems: Principal | ICD-10-CM | POA: Insufficient documentation

## 2013-06-24 DIAGNOSIS — Z5181 Encounter for therapeutic drug level monitoring: Secondary | ICD-10-CM

## 2013-06-24 DIAGNOSIS — Z7901 Long term (current) use of anticoagulants: Secondary | ICD-10-CM | POA: Diagnosis not present

## 2013-06-24 DIAGNOSIS — Z9861 Coronary angioplasty status: Secondary | ICD-10-CM | POA: Insufficient documentation

## 2013-06-24 DIAGNOSIS — R0602 Shortness of breath: Secondary | ICD-10-CM

## 2013-06-24 DIAGNOSIS — I252 Old myocardial infarction: Secondary | ICD-10-CM | POA: Diagnosis not present

## 2013-06-24 DIAGNOSIS — R9431 Abnormal electrocardiogram [ECG] [EKG]: Secondary | ICD-10-CM | POA: Insufficient documentation

## 2013-06-24 DIAGNOSIS — I82409 Acute embolism and thrombosis of unspecified deep veins of unspecified lower extremity: Secondary | ICD-10-CM | POA: Diagnosis not present

## 2013-06-24 DIAGNOSIS — R0609 Other forms of dyspnea: Secondary | ICD-10-CM | POA: Diagnosis not present

## 2013-06-24 LAB — POCT INR: INR: 2.4

## 2013-06-24 MED ORDER — TECHNETIUM TC 99M SESTAMIBI GENERIC - CARDIOLITE
11.0000 | Freq: Once | INTRAVENOUS | Status: AC | PRN
  Administered 2013-06-24: 11 via INTRAVENOUS

## 2013-06-24 MED ORDER — TECHNETIUM TC 99M SESTAMIBI GENERIC - CARDIOLITE
33.0000 | Freq: Once | INTRAVENOUS | Status: AC | PRN
  Administered 2013-06-24: 33 via INTRAVENOUS

## 2013-06-24 NOTE — Progress Notes (Signed)
Echo performed. 

## 2013-06-24 NOTE — Progress Notes (Signed)
[facility] [address] [place], [place] [address] [phone]    Cardiology Nuclear Med Study  [name] is a 80 y.o. male     MRN : [id number]     DOB: [birth year]  Procedure Date: 06/24/2013  Nuclear Med Background Indication for Stress Test:  Evaluation for Ischemia, Stent Patency and Abnormal EKG History:  CAD, MI 2011, Cath, Stent (Cfx), Echo 2013 EF 55-60%, PE 2013, DVT 2006, AS, Asthma, GXT 2014 (normal), hx DVT Cardiac Risk Factors: History of Smoking, Hypertension and Lipids  Symptoms:  DOE   Nuclear Pre-Procedure Caffeine/Decaff Intake:  None>12 hrs NPO After: 7:30am   Lungs:  clear O2 Sat: 93% on room air. IV 0.9% NS with Angio Cath:  22g  IV Site: R Wrist x 1, tolerated well IV Started by:  [name], RN  Chest Size (in):  46 Cup Size: n/a  Height: 6' (1.829 m)  Weight:  246 lb (111.585 kg)  BMI:  Body mass index is 33.36 kg/(m^2). Tech Comments:  N/A    Nuclear Med Study 1 or 2 day study: 1 day  Stress Test Type:  Stress  Reading MD: N/A  Order Authorizing Provider:  Minus Breeding, MD, and Richardson Dopp, PAC  Resting Radionuclide: Technetium 11m Sestamibi  Resting Radionuclide Dose: 11.0 mCi   Stress Radionuclide:  Technetium 2m Sestamibi  Stress Radionuclide Dose: 33.0 mCi           Stress Protocol Rest HR: 73 Stress HR: 123  Rest BP: 125/101 Stress BP: 151/96  Exercise Time (min): 4:30 METS: 6.4           Dose of Adenosine (mg):  n/a Dose of Lexiscan: n/a mg  Dose of Atropine (mg): n/a Dose of Dobutamine: n/a mcg/kg/min (at max HR)  Stress Test Technologist: Glade Lloyd, BS-ES  Nuclear Technologist:  Charlton Amor, CNMT     Rest Procedure:  Myocardial perfusion imaging was performed at rest 45 minutes following the intravenous administration of Technetium 71m Sestamibi. Rest ECG: Sinus rhythm, 73, PVC, poor R wave progression  Stress Procedure:  The patient exercised on the treadmill utilizing the  Bruce Protocol for 4:30 minutes. The patient stopped due to fatigue and denied any chest pain.  Technetium 34m Sestamibi was injected at peak exercise and myocardial perfusion imaging was performed after a brief delay.  The patient began to wheeze approximately two minutes into exercise.  Stress ECG: No significant change from baseline, occasional PVCs, couplets, no ST segment deviation  QPS Raw Data Images:  Normal; no motion artifact; normal heart/lung ratio. Stress Images:  There is moderate decreased uptake along the inferolateral base to mid distribution as well as distal lateral distribution. This is partially fixed however a degree of peri-infarct ischemia is present. Rest Images:  Inferolateral defect base to mid, slightly worse at stress. Otherwise homogeneous radiotracer uptake. Subtraction (SDS):  1. There is mild peri-infarct ischemia inferolateral. Transient Ischemic Dilatation (Normal <1.22):  0.91 Lung/Heart Ratio (Normal <0.45):  0.41  Quantitative Gated Spect Images QGS EDV:  167 ml QGS ESV:  101 ml  Impression Exercise Capacity:  Poor exercise capacity. BP Response:  Normal blood pressure response. Clinical Symptoms:  No significant symptoms noted. ECG Impression:  No significant ST segment change suggestive of ischemia. Comparison with Prior Nuclear Study: No images to compare  Overall Impression:  Intermediate risk stress nuclear study with base to mid inferolateral scar with mild peri-infarct ischemia.. This ischemia to one side with  previous circumflex stent.  LV Ejection Fraction: 40%.  LV Wall Motion:  Lateral wall hypokinesis    Candee Furbish, MD

## 2013-06-24 NOTE — Telephone Encounter (Signed)
pt notified about echo results with verbal understanding to the results given today. Pt asked if he could have a copy mailed to him to take to his PCP. I said no problem, I will mail tonight. Pt complimented how polite we all are in the office.

## 2013-06-25 ENCOUNTER — Encounter: Payer: Self-pay | Admitting: Physician Assistant

## 2013-07-01 ENCOUNTER — Encounter: Payer: Self-pay | Admitting: Cardiology

## 2013-07-01 ENCOUNTER — Ambulatory Visit (INDEPENDENT_AMBULATORY_CARE_PROVIDER_SITE_OTHER): Payer: Medicare Other | Admitting: Cardiology

## 2013-07-01 VITALS — BP 140/75 | HR 70 | Ht 72.0 in | Wt 246.0 lb

## 2013-07-01 DIAGNOSIS — I82409 Acute embolism and thrombosis of unspecified deep veins of unspecified lower extremity: Secondary | ICD-10-CM

## 2013-07-01 DIAGNOSIS — I2699 Other pulmonary embolism without acute cor pulmonale: Secondary | ICD-10-CM | POA: Diagnosis not present

## 2013-07-01 DIAGNOSIS — I10 Essential (primary) hypertension: Secondary | ICD-10-CM

## 2013-07-01 DIAGNOSIS — I2510 Atherosclerotic heart disease of native coronary artery without angina pectoris: Secondary | ICD-10-CM | POA: Diagnosis not present

## 2013-07-01 NOTE — Patient Instructions (Signed)
The current medical regimen is effective;  continue present plan and medications.  Please follow up in 2 months with Dr Percival Spanish at the [facility] office.

## 2013-07-01 NOTE — Progress Notes (Signed)
HPI The patient presents for followup. He had stenting of his circumflex in July of last 2011.  He was also hospitalized in 2013 for dyspnea and found to have a pulmonary embolism.  He recently had treatment with antibiotics very rough course from this with multiple side effects.  He also had a fall when he was out chasing his dog.  Since that time he has had some right sided pain.  He has had some dyspnea.  He had a stress perfusion study which demonstrated an EF of 40% with inferolateral scar with mild peri-infarct ischemia. Stress echocardiography demonstrated a preserved ejection fraction below normal. He did have some mild to moderate aortic stenosis. He said his breathing has slowly gotten better. He still has soreness and was evaluated by his primary provider. He says there were some pulmonary function tests but I don't have these results. He reports he was some mild asthma. He does have a little wheezing with peak exercise or laying down at night. He's not describing any PND or orthopnea. He's not describing any chest pressure, neck or arm discomfort. He's had no weight gain or edema.   Allergies  Allergen Reactions  . Darifenacin Hydrobromide Er Nausea Only    Dizziness    Current Outpatient Prescriptions  Medication Sig Dispense Refill  . budesonide-formoterol (SYMBICORT) 160-4.5 MCG/ACT inhaler Inhale 2 puffs into the lungs 2 (two) times daily.      . Cyanocobalamin (B-12) 1000 MCG CAPS Take 1 tablet by mouth daily.       . Diphenhydramine-APAP, sleep, (EXCEDRIN PM PO) Take 1 tablet by mouth at bedtime as needed (pain/sleep).       Marland Kitchen esomeprazole (NEXIUM) 40 MG capsule Take 40 mg by mouth daily before breakfast.       . fenofibrate 160 MG tablet Take 160 mg by mouth daily.      . Misc Natural Products (GLUCOSAMINE CHONDROITIN ADV PO) Take 1 tablet by mouth daily.       . Multiple Vitamin (MULTIVITAMIN) capsule Take 1 capsule by mouth daily.       . nitroGLYCERIN (NITROSTAT) 0.4 MG SL  tablet Place 1 tablet (0.4 mg total) under the tongue every 5 (five) minutes as needed.  25 tablet  8  . Omega-3 Fatty Acids (FISH OIL CONCENTRATE PO) Take 2 capsules by mouth daily.       . pravastatin (PRAVACHOL) 20 MG tablet TAKE 1 TABLET EVERY DAY  30 tablet  7  . warfarin (COUMADIN) 5 MG tablet 1/2 tablet everyday except 1 tablet on Mondays and Fridays or as directed by coumadin clinic  80 tablet  1   No current facility-administered medications for this visit.    Past Medical History  Diagnosis Date  . CAD, NATIVE VESSEL     a.  PCI and DES to circumflex; b.  ETT-Myoview (06/2013):  Inferolateral scar, mild peri-infarct ischemia, EF 40%; Intermediate Risk  . DVT 2006  . Mixed hyperlipidemia   . PROSTATE CANCER   . GERD   . Prostate cancer     s/p mutliple urologic procedures  . Pulmonary embolus 2013    LIFETIME ANTICOAGULATION BECAUSE OF PE AND PAST HX OF DVT  . MI (myocardial infarction) 08/25/09  . HYPERTENSION     PT DENIES HAVING HYPERTENSION  . Urinary incontinence     MULTIPLE BLADDER SURGERIES - STATES NO URINARY SPHINCTER - PT'S UROLOGIST IS AT [facility]- DR. PETERSON  ( LAST VISIT WAS 09/15/11 )  . Aortic stenosis  a. Echo (06/2013): Severe LVH, EF 55-60%, mild to moderate AS (mean 15 mm Hg), mild AI, mild LAE    Past Surgical History  Procedure Laterality Date  . Prostatectomy  04/25/2004  . Knee surgery      BILATERAL TOTAL KNEE REPLACEMENTS  2007 RT AND 2009 LEFT  . Hernia repair    . Vena cava filter placement    . Cardiac catheterization  2011    DES-mid LCx 08/2009; 30% pLAD, 25% m/dLAD, 75% ostial D1, 99% mLCx s/p DES, 25-30% p/mRCA, 40% mRCA, 30% mPDA stenoses; LVEF 50%, inf hypokinesis  . Coronary angioplasty  08/25/09    STENT PLACEMENT  . Knee arthrotomy Left 10/09/2012    Procedure: LEFT KNEE ARTHROTOMY;  Surgeon: Gearlean Alf, MD;  Location: [facility];  Service: Orthopedics;  Laterality: Left;  . I&d knee with poly exchange Left 10/09/2012    Procedure:  IRRIGATION AND DEBRIDEMENT LEFT KNEE WITH POLY REVISION;  Surgeon: Gearlean Alf, MD;  Location: [facility];  Service: Orthopedics;  Laterality: Left;    ROS:  Urinary incontinence. Otherwise as stated in the HPI and negative for all other systems.  PHYSICAL EXAM BP 140/75  Pulse 70  Ht 6' (1.829 m)  Wt 246 lb (111.585 kg)  BMI 33.36 kg/m2 GENERAL:  Well appearing LYMPHATICS:  No cervical, inguinal adenopathy LUNGS:  Clear to auscultation bilaterally CHEST:  Unremarkable HEART:  PMI not displaced or sustained,S1 and S2 within normal limits, no S3, no S4, no clicks, no rubs, soft apical systolic early peaking murmur, no diastolic murmurs ABD:  Flat, positive bowel sounds normal in frequency in pitch, no bruits, no rebound, no guarding, no midline pulsatile mass, no hepatomegaly, no splenomegaly, obese EXT:  2 plus pulses throughout, no edema, no cyanosis no clubbing SKIN:  No rashes no nodules   ASSESSMENT AND PLAN  DVT He will need continued warfarin with recurrent DVT/PE  CAD, NATIVE VESSEL -  He had the stress test as above.  This would be consistent with his known coronary anatomy. There is a predominantly fixed defect. At this point I would not plan cardiac catheterization. However, he has worsening dyspnea we would need to do this. He and I discussed this at length.  HYPERTENSION -  The blood pressure is at target. No change in medications is indicated. We will continue with therapeutic lifestyle changes (TLC).   MIXED HYPERLIPIDEMIA -  This was recently checked by Shirline Frees, MD.  I will defer to his management

## 2013-07-15 ENCOUNTER — Ambulatory Visit (INDEPENDENT_AMBULATORY_CARE_PROVIDER_SITE_OTHER): Payer: Medicare Other | Admitting: Pharmacist

## 2013-07-15 DIAGNOSIS — I82409 Acute embolism and thrombosis of unspecified deep veins of unspecified lower extremity: Secondary | ICD-10-CM

## 2013-07-15 DIAGNOSIS — Z5181 Encounter for therapeutic drug level monitoring: Secondary | ICD-10-CM

## 2013-07-15 DIAGNOSIS — I2699 Other pulmonary embolism without acute cor pulmonale: Secondary | ICD-10-CM

## 2013-07-15 DIAGNOSIS — Z7901 Long term (current) use of anticoagulants: Secondary | ICD-10-CM

## 2013-07-15 LAB — POCT INR: INR: 3.5

## 2013-07-29 ENCOUNTER — Ambulatory Visit (INDEPENDENT_AMBULATORY_CARE_PROVIDER_SITE_OTHER): Payer: Medicare Other | Admitting: *Deleted

## 2013-07-29 DIAGNOSIS — I82409 Acute embolism and thrombosis of unspecified deep veins of unspecified lower extremity: Secondary | ICD-10-CM

## 2013-07-29 DIAGNOSIS — Z7901 Long term (current) use of anticoagulants: Secondary | ICD-10-CM | POA: Diagnosis not present

## 2013-07-29 DIAGNOSIS — I2699 Other pulmonary embolism without acute cor pulmonale: Secondary | ICD-10-CM

## 2013-07-29 DIAGNOSIS — Z5181 Encounter for therapeutic drug level monitoring: Secondary | ICD-10-CM | POA: Diagnosis not present

## 2013-07-29 LAB — POCT INR: INR: 3

## 2013-08-05 ENCOUNTER — Telehealth: Payer: Self-pay | Admitting: *Deleted

## 2013-08-05 NOTE — Telephone Encounter (Signed)
I spoke with pt & he is aware of his monitor results as well as appointment on 09/10/13 with Dr. Percival Spanish at 9:30am Horton Chin RN

## 2013-08-12 ENCOUNTER — Ambulatory Visit (INDEPENDENT_AMBULATORY_CARE_PROVIDER_SITE_OTHER): Payer: Medicare Other | Admitting: Pharmacist

## 2013-08-12 DIAGNOSIS — I82409 Acute embolism and thrombosis of unspecified deep veins of unspecified lower extremity: Secondary | ICD-10-CM

## 2013-08-12 DIAGNOSIS — Z7901 Long term (current) use of anticoagulants: Secondary | ICD-10-CM

## 2013-08-12 DIAGNOSIS — Z5181 Encounter for therapeutic drug level monitoring: Secondary | ICD-10-CM | POA: Diagnosis not present

## 2013-08-12 DIAGNOSIS — I2699 Other pulmonary embolism without acute cor pulmonale: Secondary | ICD-10-CM

## 2013-08-12 LAB — POCT INR: INR: 2.8

## 2013-08-27 ENCOUNTER — Other Ambulatory Visit: Payer: Self-pay

## 2013-08-27 MED ORDER — NITROGLYCERIN 0.4 MG SL SUBL: 0.4000 mg | SUBLINGUAL_TABLET | SUBLINGUAL | Status: DC | PRN

## 2013-09-08 ENCOUNTER — Other Ambulatory Visit: Payer: Self-pay

## 2013-09-08 MED ORDER — FENOFIBRATE 160 MG PO TABS
160.0000 mg | ORAL_TABLET | Freq: Every day | ORAL | Status: DC
Start: 2013-09-08 — End: 2013-12-24

## 2013-09-10 ENCOUNTER — Ambulatory Visit: Payer: Medicare Other | Admitting: Cardiology

## 2013-09-10 ENCOUNTER — Ambulatory Visit: Payer: Medicare Other | Admitting: Pharmacist Clinician (PhC)/ Clinical Pharmacy Specialist

## 2013-09-12 ENCOUNTER — Ambulatory Visit (INDEPENDENT_AMBULATORY_CARE_PROVIDER_SITE_OTHER): Payer: Medicare Other | Admitting: Pharmacist Clinician (PhC)/ Clinical Pharmacy Specialist

## 2013-09-12 DIAGNOSIS — I82409 Acute embolism and thrombosis of unspecified deep veins of unspecified lower extremity: Secondary | ICD-10-CM

## 2013-09-12 DIAGNOSIS — I2699 Other pulmonary embolism without acute cor pulmonale: Secondary | ICD-10-CM

## 2013-09-12 DIAGNOSIS — Z5181 Encounter for therapeutic drug level monitoring: Secondary | ICD-10-CM

## 2013-09-12 DIAGNOSIS — Z7901 Long term (current) use of anticoagulants: Secondary | ICD-10-CM

## 2013-09-12 LAB — POCT INR: INR: 2.2

## 2013-09-18 DIAGNOSIS — H903 Sensorineural hearing loss, bilateral: Secondary | ICD-10-CM | POA: Diagnosis not present

## 2013-09-29 DIAGNOSIS — R071 Chest pain on breathing: Secondary | ICD-10-CM | POA: Diagnosis not present

## 2013-10-01 ENCOUNTER — Other Ambulatory Visit: Payer: Self-pay | Admitting: Family Medicine

## 2013-10-01 ENCOUNTER — Ambulatory Visit
Admission: RE | Admit: 2013-10-01 | Discharge: 2013-10-01 | Disposition: A | Discharge status: Home / Self Care | Payer: Medicare Other | Source: Ambulatory Visit | Attending: Family Medicine | Admitting: Family Medicine

## 2013-10-01 DIAGNOSIS — R079 Chest pain, unspecified: Secondary | ICD-10-CM | POA: Diagnosis not present

## 2013-10-01 DIAGNOSIS — R0789 Other chest pain: Secondary | ICD-10-CM

## 2013-10-01 DIAGNOSIS — S298XXA Other specified injuries of thorax, initial encounter: Secondary | ICD-10-CM | POA: Diagnosis not present

## 2013-10-10 ENCOUNTER — Ambulatory Visit (INDEPENDENT_AMBULATORY_CARE_PROVIDER_SITE_OTHER): Payer: Medicare Other | Admitting: Pharmacist Clinician (PhC)/ Clinical Pharmacy Specialist

## 2013-10-10 DIAGNOSIS — I2699 Other pulmonary embolism without acute cor pulmonale: Secondary | ICD-10-CM | POA: Diagnosis not present

## 2013-10-10 DIAGNOSIS — Z7901 Long term (current) use of anticoagulants: Secondary | ICD-10-CM

## 2013-10-10 DIAGNOSIS — I82409 Acute embolism and thrombosis of unspecified deep veins of unspecified lower extremity: Secondary | ICD-10-CM

## 2013-10-10 DIAGNOSIS — Z5181 Encounter for therapeutic drug level monitoring: Secondary | ICD-10-CM

## 2013-10-10 LAB — POCT INR: INR: 2.1

## 2013-11-05 ENCOUNTER — Ambulatory Visit (INDEPENDENT_AMBULATORY_CARE_PROVIDER_SITE_OTHER): Payer: Medicare Other | Admitting: Pharmacist Clinician (PhC)/ Clinical Pharmacy Specialist

## 2013-11-05 DIAGNOSIS — I2699 Other pulmonary embolism without acute cor pulmonale: Secondary | ICD-10-CM

## 2013-11-05 DIAGNOSIS — Z7901 Long term (current) use of anticoagulants: Secondary | ICD-10-CM

## 2013-11-05 DIAGNOSIS — Z5181 Encounter for therapeutic drug level monitoring: Secondary | ICD-10-CM | POA: Diagnosis not present

## 2013-11-05 DIAGNOSIS — I82409 Acute embolism and thrombosis of unspecified deep veins of unspecified lower extremity: Secondary | ICD-10-CM

## 2013-11-05 LAB — POCT INR: INR: 1.1

## 2013-11-17 ENCOUNTER — Ambulatory Visit
Admission: RE | Admit: 2013-11-17 | Discharge: 2013-11-17 | Disposition: A | Discharge status: Home / Self Care | Payer: Medicare Other | Source: Ambulatory Visit | Attending: Family Medicine | Admitting: Family Medicine

## 2013-11-17 ENCOUNTER — Other Ambulatory Visit: Payer: Self-pay | Admitting: Family Medicine

## 2013-11-17 DIAGNOSIS — R490 Dysphonia: Secondary | ICD-10-CM | POA: Diagnosis not present

## 2013-11-17 DIAGNOSIS — S20212A Contusion of left front wall of thorax, initial encounter: Secondary | ICD-10-CM | POA: Diagnosis not present

## 2013-11-17 DIAGNOSIS — S299XXA Unspecified injury of thorax, initial encounter: Secondary | ICD-10-CM | POA: Diagnosis not present

## 2013-11-17 DIAGNOSIS — R079 Chest pain, unspecified: Secondary | ICD-10-CM | POA: Diagnosis not present

## 2013-11-17 DIAGNOSIS — R0600 Dyspnea, unspecified: Secondary | ICD-10-CM | POA: Diagnosis not present

## 2013-11-21 ENCOUNTER — Ambulatory Visit (INDEPENDENT_AMBULATORY_CARE_PROVIDER_SITE_OTHER): Payer: Medicare Other | Admitting: Pharmacist Clinician (PhC)/ Clinical Pharmacy Specialist

## 2013-11-21 DIAGNOSIS — Z5181 Encounter for therapeutic drug level monitoring: Secondary | ICD-10-CM | POA: Diagnosis not present

## 2013-11-21 DIAGNOSIS — Z7901 Long term (current) use of anticoagulants: Secondary | ICD-10-CM | POA: Diagnosis not present

## 2013-11-21 DIAGNOSIS — I2699 Other pulmonary embolism without acute cor pulmonale: Secondary | ICD-10-CM | POA: Diagnosis not present

## 2013-11-21 DIAGNOSIS — I82409 Acute embolism and thrombosis of unspecified deep veins of unspecified lower extremity: Secondary | ICD-10-CM

## 2013-11-21 LAB — POCT INR: INR: 1.7

## 2013-12-05 ENCOUNTER — Ambulatory Visit (INDEPENDENT_AMBULATORY_CARE_PROVIDER_SITE_OTHER): Payer: Medicare Other | Admitting: Pharmacist Clinician (PhC)/ Clinical Pharmacy Specialist

## 2013-12-05 DIAGNOSIS — Z5181 Encounter for therapeutic drug level monitoring: Secondary | ICD-10-CM | POA: Diagnosis not present

## 2013-12-05 DIAGNOSIS — I82409 Acute embolism and thrombosis of unspecified deep veins of unspecified lower extremity: Secondary | ICD-10-CM

## 2013-12-05 DIAGNOSIS — I2699 Other pulmonary embolism without acute cor pulmonale: Secondary | ICD-10-CM

## 2013-12-05 DIAGNOSIS — Z7901 Long term (current) use of anticoagulants: Secondary | ICD-10-CM | POA: Diagnosis not present

## 2013-12-05 LAB — POCT INR: INR: 3.5

## 2013-12-16 DIAGNOSIS — E785 Hyperlipidemia, unspecified: Secondary | ICD-10-CM | POA: Diagnosis not present

## 2013-12-16 DIAGNOSIS — N183 Chronic kidney disease, stage 3 (moderate): Secondary | ICD-10-CM | POA: Diagnosis not present

## 2013-12-17 ENCOUNTER — Ambulatory Visit (INDEPENDENT_AMBULATORY_CARE_PROVIDER_SITE_OTHER): Payer: Medicare Other | Admitting: Pharmacist Clinician (PhC)/ Clinical Pharmacy Specialist

## 2013-12-17 DIAGNOSIS — I2699 Other pulmonary embolism without acute cor pulmonale: Secondary | ICD-10-CM | POA: Diagnosis not present

## 2013-12-17 DIAGNOSIS — I82409 Acute embolism and thrombosis of unspecified deep veins of unspecified lower extremity: Secondary | ICD-10-CM

## 2013-12-17 DIAGNOSIS — Z5181 Encounter for therapeutic drug level monitoring: Secondary | ICD-10-CM | POA: Diagnosis not present

## 2013-12-17 DIAGNOSIS — Z7901 Long term (current) use of anticoagulants: Secondary | ICD-10-CM | POA: Diagnosis not present

## 2013-12-17 LAB — POCT INR: INR: 2.2

## 2013-12-22 ENCOUNTER — Other Ambulatory Visit: Payer: Self-pay | Admitting: Cardiology

## 2013-12-25 ENCOUNTER — Other Ambulatory Visit: Payer: Self-pay | Admitting: Cardiology

## 2013-12-25 ENCOUNTER — Other Ambulatory Visit: Payer: Self-pay | Admitting: *Deleted

## 2013-12-25 MED ORDER — FENOFIBRATE 160 MG PO TABS: 160.0000 mg | ORAL_TABLET | Freq: Every day | ORAL | Status: DC

## 2014-01-07 ENCOUNTER — Ambulatory Visit (INDEPENDENT_AMBULATORY_CARE_PROVIDER_SITE_OTHER): Payer: Medicare Other | Admitting: Pharmacist Clinician (PhC)/ Clinical Pharmacy Specialist

## 2014-01-07 DIAGNOSIS — Z7901 Long term (current) use of anticoagulants: Secondary | ICD-10-CM | POA: Diagnosis not present

## 2014-01-07 DIAGNOSIS — Z5181 Encounter for therapeutic drug level monitoring: Secondary | ICD-10-CM

## 2014-01-07 DIAGNOSIS — I82409 Acute embolism and thrombosis of unspecified deep veins of unspecified lower extremity: Secondary | ICD-10-CM

## 2014-01-07 DIAGNOSIS — I2699 Other pulmonary embolism without acute cor pulmonale: Secondary | ICD-10-CM

## 2014-01-07 LAB — POCT INR: INR: 3.5

## 2014-01-28 ENCOUNTER — Ambulatory Visit (INDEPENDENT_AMBULATORY_CARE_PROVIDER_SITE_OTHER): Payer: Medicare Other | Admitting: Pharmacist Clinician (PhC)/ Clinical Pharmacy Specialist

## 2014-01-28 DIAGNOSIS — I2699 Other pulmonary embolism without acute cor pulmonale: Secondary | ICD-10-CM

## 2014-01-28 DIAGNOSIS — Z7901 Long term (current) use of anticoagulants: Secondary | ICD-10-CM | POA: Diagnosis not present

## 2014-01-28 DIAGNOSIS — Z5181 Encounter for therapeutic drug level monitoring: Secondary | ICD-10-CM

## 2014-01-28 DIAGNOSIS — I82409 Acute embolism and thrombosis of unspecified deep veins of unspecified lower extremity: Secondary | ICD-10-CM

## 2014-01-28 LAB — POCT INR: INR: 2.8

## 2014-01-28 MED ORDER — RIVAROXABAN 20 MG PO TABS
20.0000 mg | ORAL_TABLET | Freq: Every day | ORAL | Status: DC
Start: 2014-01-28 — End: 2014-09-19

## 2014-01-28 NOTE — Progress Notes (Signed)
Pt was started on Xarelto for pulmonary embolism/DVT on Jan 28, 2014.    Reviewed patients medication list.  Pt is not currently on any combined P-gp and strong CYP3A4 inhibitors/inducers (ketoconazole, traconazole, ritonavir, carbamazepine, phenytoin, rifampin, St. John's wort).  Reviewed labs.  SCr  1.5  Weight 246 lb, CrCl- 62.  Dose appropriate based on CrCl.   Hgb and HCT WNL  A full discussion of the nature of anticoagulants has been carried out.  A benefit/risk analysis has been presented to the patient, so that they understand the justification for choosing anticoagulation with Xarelto at this time.  The need for compliance is stressed.  Pt is aware to take the medication once daily with the largest meal of the day.  Side effects of potential bleeding are discussed, including unusual colored urine or stools, coughing up blood or coffee ground emesis, nose bleeds or serious fall or head trauma.  Discussed signs and symptoms of stroke. The patient should avoid any OTC items containing aspirin or ibuprofen.  Avoid alcohol consumption.   Call if any signs of abnormal bleeding.  Discussed financial obligations and resolved any difficulty in obtaining medication.

## 2014-02-13 DIAGNOSIS — I2510 Atherosclerotic heart disease of native coronary artery without angina pectoris: Secondary | ICD-10-CM | POA: Diagnosis not present

## 2014-02-13 DIAGNOSIS — N183 Chronic kidney disease, stage 3 (moderate): Secondary | ICD-10-CM | POA: Diagnosis not present

## 2014-02-13 DIAGNOSIS — M1990 Unspecified osteoarthritis, unspecified site: Secondary | ICD-10-CM | POA: Diagnosis not present

## 2014-02-13 DIAGNOSIS — Z7901 Long term (current) use of anticoagulants: Secondary | ICD-10-CM | POA: Diagnosis not present

## 2014-02-13 DIAGNOSIS — E785 Hyperlipidemia, unspecified: Secondary | ICD-10-CM | POA: Diagnosis not present

## 2014-02-13 DIAGNOSIS — C61 Malignant neoplasm of prostate: Secondary | ICD-10-CM | POA: Diagnosis not present

## 2014-02-13 DIAGNOSIS — J309 Allergic rhinitis, unspecified: Secondary | ICD-10-CM | POA: Diagnosis not present

## 2014-02-13 DIAGNOSIS — R062 Wheezing: Secondary | ICD-10-CM | POA: Diagnosis not present

## 2014-02-13 DIAGNOSIS — K219 Gastro-esophageal reflux disease without esophagitis: Secondary | ICD-10-CM | POA: Diagnosis not present

## 2014-02-23 ENCOUNTER — Institutional Professional Consult (permissible substitution): Payer: Medicare Other | Admitting: Emergency Medicine

## 2014-02-27 ENCOUNTER — Ambulatory Visit: Payer: Medicare Other | Admitting: Pharmacist Clinician (PhC)/ Clinical Pharmacy Specialist

## 2014-03-02 ENCOUNTER — Ambulatory Visit: Payer: Medicare Other | Admitting: Pharmacist Clinician (PhC)/ Clinical Pharmacy Specialist

## 2014-03-04 ENCOUNTER — Encounter: Payer: Self-pay | Admitting: Cardiology

## 2014-03-04 DIAGNOSIS — E669 Obesity, unspecified: Secondary | ICD-10-CM | POA: Insufficient documentation

## 2014-03-04 DIAGNOSIS — E784 Other hyperlipidemia: Secondary | ICD-10-CM | POA: Diagnosis not present

## 2014-03-04 DIAGNOSIS — Z86718 Personal history of other venous thrombosis and embolism: Secondary | ICD-10-CM | POA: Diagnosis not present

## 2014-03-04 DIAGNOSIS — I10 Essential (primary) hypertension: Secondary | ICD-10-CM | POA: Diagnosis not present

## 2014-03-04 DIAGNOSIS — Z955 Presence of coronary angioplasty implant and graft: Secondary | ICD-10-CM | POA: Diagnosis not present

## 2014-03-04 DIAGNOSIS — E668 Other obesity: Secondary | ICD-10-CM | POA: Diagnosis not present

## 2014-03-04 DIAGNOSIS — I25119 Atherosclerotic heart disease of native coronary artery with unspecified angina pectoris: Secondary | ICD-10-CM | POA: Insufficient documentation

## 2014-03-04 DIAGNOSIS — Z8546 Personal history of malignant neoplasm of prostate: Secondary | ICD-10-CM | POA: Diagnosis not present

## 2014-03-04 DIAGNOSIS — R32 Unspecified urinary incontinence: Secondary | ICD-10-CM | POA: Insufficient documentation

## 2014-03-04 DIAGNOSIS — K219 Gastro-esophageal reflux disease without esophagitis: Secondary | ICD-10-CM | POA: Diagnosis not present

## 2014-03-04 DIAGNOSIS — I350 Nonrheumatic aortic (valve) stenosis: Secondary | ICD-10-CM | POA: Insufficient documentation

## 2014-03-04 DIAGNOSIS — I2510 Atherosclerotic heart disease of native coronary artery without angina pectoris: Secondary | ICD-10-CM | POA: Diagnosis not present

## 2014-03-04 DIAGNOSIS — Z7901 Long term (current) use of anticoagulants: Secondary | ICD-10-CM | POA: Diagnosis not present

## 2014-03-04 DIAGNOSIS — I119 Hypertensive heart disease without heart failure: Secondary | ICD-10-CM | POA: Diagnosis not present

## 2014-03-05 DIAGNOSIS — N183 Chronic kidney disease, stage 3 (moderate): Secondary | ICD-10-CM | POA: Diagnosis not present

## 2014-03-05 DIAGNOSIS — R062 Wheezing: Secondary | ICD-10-CM | POA: Diagnosis not present

## 2014-03-05 DIAGNOSIS — J45998 Other asthma: Secondary | ICD-10-CM | POA: Diagnosis not present

## 2014-03-05 DIAGNOSIS — I2510 Atherosclerotic heart disease of native coronary artery without angina pectoris: Secondary | ICD-10-CM | POA: Diagnosis not present

## 2014-03-12 DIAGNOSIS — K625 Hemorrhage of anus and rectum: Secondary | ICD-10-CM | POA: Diagnosis not present

## 2014-03-25 DIAGNOSIS — Z7901 Long term (current) use of anticoagulants: Secondary | ICD-10-CM | POA: Diagnosis not present

## 2014-03-25 DIAGNOSIS — Z5181 Encounter for therapeutic drug level monitoring: Secondary | ICD-10-CM | POA: Diagnosis not present

## 2014-03-26 DIAGNOSIS — K921 Melena: Secondary | ICD-10-CM | POA: Diagnosis not present

## 2014-03-26 DIAGNOSIS — K641 Second degree hemorrhoids: Secondary | ICD-10-CM | POA: Diagnosis not present

## 2014-03-26 DIAGNOSIS — K625 Hemorrhage of anus and rectum: Secondary | ICD-10-CM | POA: Diagnosis not present

## 2014-04-02 DIAGNOSIS — R062 Wheezing: Secondary | ICD-10-CM | POA: Diagnosis not present

## 2014-04-02 DIAGNOSIS — Z79899 Other long term (current) drug therapy: Secondary | ICD-10-CM | POA: Diagnosis not present

## 2014-04-02 DIAGNOSIS — N183 Chronic kidney disease, stage 3 (moderate): Secondary | ICD-10-CM | POA: Diagnosis not present

## 2014-04-02 DIAGNOSIS — J45998 Other asthma: Secondary | ICD-10-CM | POA: Diagnosis not present

## 2014-04-09 DIAGNOSIS — I2510 Atherosclerotic heart disease of native coronary artery without angina pectoris: Secondary | ICD-10-CM | POA: Diagnosis not present

## 2014-04-09 DIAGNOSIS — Z7901 Long term (current) use of anticoagulants: Secondary | ICD-10-CM | POA: Diagnosis not present

## 2014-04-09 DIAGNOSIS — K219 Gastro-esophageal reflux disease without esophagitis: Secondary | ICD-10-CM | POA: Diagnosis not present

## 2014-04-09 DIAGNOSIS — I350 Nonrheumatic aortic (valve) stenosis: Secondary | ICD-10-CM | POA: Diagnosis not present

## 2014-04-09 DIAGNOSIS — I119 Hypertensive heart disease without heart failure: Secondary | ICD-10-CM | POA: Diagnosis not present

## 2014-04-09 DIAGNOSIS — Z8546 Personal history of malignant neoplasm of prostate: Secondary | ICD-10-CM | POA: Diagnosis not present

## 2014-04-09 DIAGNOSIS — Z955 Presence of coronary angioplasty implant and graft: Secondary | ICD-10-CM | POA: Diagnosis not present

## 2014-04-09 DIAGNOSIS — E784 Other hyperlipidemia: Secondary | ICD-10-CM | POA: Diagnosis not present

## 2014-04-09 DIAGNOSIS — E668 Other obesity: Secondary | ICD-10-CM | POA: Diagnosis not present

## 2014-04-09 DIAGNOSIS — Z86718 Personal history of other venous thrombosis and embolism: Secondary | ICD-10-CM | POA: Diagnosis not present

## 2014-04-22 DIAGNOSIS — M9905 Segmental and somatic dysfunction of pelvic region: Secondary | ICD-10-CM | POA: Diagnosis not present

## 2014-04-22 DIAGNOSIS — M9903 Segmental and somatic dysfunction of lumbar region: Secondary | ICD-10-CM | POA: Diagnosis not present

## 2014-04-22 DIAGNOSIS — M545 Low back pain: Secondary | ICD-10-CM | POA: Diagnosis not present

## 2014-04-22 DIAGNOSIS — M62830 Muscle spasm of back: Secondary | ICD-10-CM | POA: Diagnosis not present

## 2014-04-23 DIAGNOSIS — M545 Low back pain: Secondary | ICD-10-CM | POA: Diagnosis not present

## 2014-04-23 DIAGNOSIS — M9905 Segmental and somatic dysfunction of pelvic region: Secondary | ICD-10-CM | POA: Diagnosis not present

## 2014-04-23 DIAGNOSIS — M9903 Segmental and somatic dysfunction of lumbar region: Secondary | ICD-10-CM | POA: Diagnosis not present

## 2014-04-23 DIAGNOSIS — M62830 Muscle spasm of back: Secondary | ICD-10-CM | POA: Diagnosis not present

## 2014-04-27 DIAGNOSIS — M9905 Segmental and somatic dysfunction of pelvic region: Secondary | ICD-10-CM | POA: Diagnosis not present

## 2014-04-27 DIAGNOSIS — M62830 Muscle spasm of back: Secondary | ICD-10-CM | POA: Diagnosis not present

## 2014-04-27 DIAGNOSIS — M545 Low back pain: Secondary | ICD-10-CM | POA: Diagnosis not present

## 2014-04-27 DIAGNOSIS — M9903 Segmental and somatic dysfunction of lumbar region: Secondary | ICD-10-CM | POA: Diagnosis not present

## 2014-04-30 DIAGNOSIS — M545 Low back pain: Secondary | ICD-10-CM | POA: Diagnosis not present

## 2014-04-30 DIAGNOSIS — M9905 Segmental and somatic dysfunction of pelvic region: Secondary | ICD-10-CM | POA: Diagnosis not present

## 2014-04-30 DIAGNOSIS — M62830 Muscle spasm of back: Secondary | ICD-10-CM | POA: Diagnosis not present

## 2014-04-30 DIAGNOSIS — M9903 Segmental and somatic dysfunction of lumbar region: Secondary | ICD-10-CM | POA: Diagnosis not present

## 2014-05-04 DIAGNOSIS — N183 Chronic kidney disease, stage 3 (moderate): Secondary | ICD-10-CM | POA: Diagnosis not present

## 2014-05-04 DIAGNOSIS — R062 Wheezing: Secondary | ICD-10-CM | POA: Diagnosis not present

## 2014-05-04 DIAGNOSIS — Z0000 Encounter for general adult medical examination without abnormal findings: Secondary | ICD-10-CM | POA: Diagnosis not present

## 2014-05-04 DIAGNOSIS — J45998 Other asthma: Secondary | ICD-10-CM | POA: Diagnosis not present

## 2014-05-04 DIAGNOSIS — Z79899 Other long term (current) drug therapy: Secondary | ICD-10-CM | POA: Diagnosis not present

## 2014-05-07 DIAGNOSIS — I119 Hypertensive heart disease without heart failure: Secondary | ICD-10-CM | POA: Diagnosis not present

## 2014-05-07 DIAGNOSIS — M9905 Segmental and somatic dysfunction of pelvic region: Secondary | ICD-10-CM | POA: Diagnosis not present

## 2014-05-07 DIAGNOSIS — E668 Other obesity: Secondary | ICD-10-CM | POA: Diagnosis not present

## 2014-05-07 DIAGNOSIS — Z8546 Personal history of malignant neoplasm of prostate: Secondary | ICD-10-CM | POA: Diagnosis not present

## 2014-05-07 DIAGNOSIS — E784 Other hyperlipidemia: Secondary | ICD-10-CM | POA: Diagnosis not present

## 2014-05-07 DIAGNOSIS — M545 Low back pain: Secondary | ICD-10-CM | POA: Diagnosis not present

## 2014-05-07 DIAGNOSIS — M62830 Muscle spasm of back: Secondary | ICD-10-CM | POA: Diagnosis not present

## 2014-05-07 DIAGNOSIS — I2510 Atherosclerotic heart disease of native coronary artery without angina pectoris: Secondary | ICD-10-CM | POA: Diagnosis not present

## 2014-05-07 DIAGNOSIS — K219 Gastro-esophageal reflux disease without esophagitis: Secondary | ICD-10-CM | POA: Diagnosis not present

## 2014-05-07 DIAGNOSIS — Z86718 Personal history of other venous thrombosis and embolism: Secondary | ICD-10-CM | POA: Diagnosis not present

## 2014-05-07 DIAGNOSIS — Z955 Presence of coronary angioplasty implant and graft: Secondary | ICD-10-CM | POA: Diagnosis not present

## 2014-05-07 DIAGNOSIS — I350 Nonrheumatic aortic (valve) stenosis: Secondary | ICD-10-CM | POA: Diagnosis not present

## 2014-05-07 DIAGNOSIS — Z7901 Long term (current) use of anticoagulants: Secondary | ICD-10-CM | POA: Diagnosis not present

## 2014-05-07 DIAGNOSIS — M9903 Segmental and somatic dysfunction of lumbar region: Secondary | ICD-10-CM | POA: Diagnosis not present

## 2014-05-14 DIAGNOSIS — M9903 Segmental and somatic dysfunction of lumbar region: Secondary | ICD-10-CM | POA: Diagnosis not present

## 2014-05-14 DIAGNOSIS — M62830 Muscle spasm of back: Secondary | ICD-10-CM | POA: Diagnosis not present

## 2014-05-14 DIAGNOSIS — M9905 Segmental and somatic dysfunction of pelvic region: Secondary | ICD-10-CM | POA: Diagnosis not present

## 2014-05-14 DIAGNOSIS — M545 Low back pain: Secondary | ICD-10-CM | POA: Diagnosis not present

## 2014-05-15 DIAGNOSIS — N183 Chronic kidney disease, stage 3 (moderate): Secondary | ICD-10-CM | POA: Diagnosis not present

## 2014-05-15 DIAGNOSIS — R062 Wheezing: Secondary | ICD-10-CM | POA: Diagnosis not present

## 2014-05-15 DIAGNOSIS — J45998 Other asthma: Secondary | ICD-10-CM | POA: Diagnosis not present

## 2014-05-28 DIAGNOSIS — M62830 Muscle spasm of back: Secondary | ICD-10-CM | POA: Diagnosis not present

## 2014-05-28 DIAGNOSIS — M9905 Segmental and somatic dysfunction of pelvic region: Secondary | ICD-10-CM | POA: Diagnosis not present

## 2014-05-28 DIAGNOSIS — M9903 Segmental and somatic dysfunction of lumbar region: Secondary | ICD-10-CM | POA: Diagnosis not present

## 2014-05-28 DIAGNOSIS — M545 Low back pain: Secondary | ICD-10-CM | POA: Diagnosis not present

## 2014-06-05 DIAGNOSIS — K219 Gastro-esophageal reflux disease without esophagitis: Secondary | ICD-10-CM | POA: Diagnosis not present

## 2014-06-05 DIAGNOSIS — E668 Other obesity: Secondary | ICD-10-CM | POA: Diagnosis not present

## 2014-06-05 DIAGNOSIS — Z7901 Long term (current) use of anticoagulants: Secondary | ICD-10-CM | POA: Diagnosis not present

## 2014-06-05 DIAGNOSIS — I2510 Atherosclerotic heart disease of native coronary artery without angina pectoris: Secondary | ICD-10-CM | POA: Diagnosis not present

## 2014-06-05 DIAGNOSIS — I350 Nonrheumatic aortic (valve) stenosis: Secondary | ICD-10-CM | POA: Diagnosis not present

## 2014-06-05 DIAGNOSIS — Z86718 Personal history of other venous thrombosis and embolism: Secondary | ICD-10-CM | POA: Diagnosis not present

## 2014-06-05 DIAGNOSIS — E784 Other hyperlipidemia: Secondary | ICD-10-CM | POA: Diagnosis not present

## 2014-06-05 DIAGNOSIS — Z955 Presence of coronary angioplasty implant and graft: Secondary | ICD-10-CM | POA: Diagnosis not present

## 2014-06-05 DIAGNOSIS — Z8546 Personal history of malignant neoplasm of prostate: Secondary | ICD-10-CM | POA: Diagnosis not present

## 2014-06-05 DIAGNOSIS — I119 Hypertensive heart disease without heart failure: Secondary | ICD-10-CM | POA: Diagnosis not present

## 2014-07-03 DIAGNOSIS — I119 Hypertensive heart disease without heart failure: Secondary | ICD-10-CM | POA: Diagnosis not present

## 2014-07-03 DIAGNOSIS — I2510 Atherosclerotic heart disease of native coronary artery without angina pectoris: Secondary | ICD-10-CM | POA: Diagnosis not present

## 2014-07-03 DIAGNOSIS — E784 Other hyperlipidemia: Secondary | ICD-10-CM | POA: Diagnosis not present

## 2014-07-03 DIAGNOSIS — E668 Other obesity: Secondary | ICD-10-CM | POA: Diagnosis not present

## 2014-07-03 DIAGNOSIS — I350 Nonrheumatic aortic (valve) stenosis: Secondary | ICD-10-CM | POA: Diagnosis not present

## 2014-07-03 DIAGNOSIS — Z86718 Personal history of other venous thrombosis and embolism: Secondary | ICD-10-CM | POA: Diagnosis not present

## 2014-07-03 DIAGNOSIS — K219 Gastro-esophageal reflux disease without esophagitis: Secondary | ICD-10-CM | POA: Diagnosis not present

## 2014-07-03 DIAGNOSIS — Z8546 Personal history of malignant neoplasm of prostate: Secondary | ICD-10-CM | POA: Diagnosis not present

## 2014-07-03 DIAGNOSIS — Z7901 Long term (current) use of anticoagulants: Secondary | ICD-10-CM | POA: Diagnosis not present

## 2014-07-03 DIAGNOSIS — Z955 Presence of coronary angioplasty implant and graft: Secondary | ICD-10-CM | POA: Diagnosis not present

## 2014-07-07 DIAGNOSIS — G4710 Hypersomnia, unspecified: Secondary | ICD-10-CM | POA: Diagnosis not present

## 2014-07-07 DIAGNOSIS — G4761 Periodic limb movement disorder: Secondary | ICD-10-CM | POA: Diagnosis not present

## 2014-07-07 DIAGNOSIS — R0683 Snoring: Secondary | ICD-10-CM | POA: Diagnosis not present

## 2014-07-07 DIAGNOSIS — G4733 Obstructive sleep apnea (adult) (pediatric): Secondary | ICD-10-CM | POA: Diagnosis not present

## 2014-07-13 DIAGNOSIS — M62830 Muscle spasm of back: Secondary | ICD-10-CM | POA: Diagnosis not present

## 2014-07-13 DIAGNOSIS — M9903 Segmental and somatic dysfunction of lumbar region: Secondary | ICD-10-CM | POA: Diagnosis not present

## 2014-07-13 DIAGNOSIS — M545 Low back pain: Secondary | ICD-10-CM | POA: Diagnosis not present

## 2014-07-13 DIAGNOSIS — M9905 Segmental and somatic dysfunction of pelvic region: Secondary | ICD-10-CM | POA: Diagnosis not present

## 2014-07-15 DIAGNOSIS — J4540 Moderate persistent asthma, uncomplicated: Secondary | ICD-10-CM | POA: Diagnosis not present

## 2014-07-15 DIAGNOSIS — G4733 Obstructive sleep apnea (adult) (pediatric): Secondary | ICD-10-CM | POA: Diagnosis not present

## 2014-07-15 DIAGNOSIS — N183 Chronic kidney disease, stage 3 (moderate): Secondary | ICD-10-CM | POA: Diagnosis not present

## 2014-07-31 DIAGNOSIS — I350 Nonrheumatic aortic (valve) stenosis: Secondary | ICD-10-CM | POA: Diagnosis not present

## 2014-07-31 DIAGNOSIS — Z86718 Personal history of other venous thrombosis and embolism: Secondary | ICD-10-CM | POA: Diagnosis not present

## 2014-07-31 DIAGNOSIS — I2510 Atherosclerotic heart disease of native coronary artery without angina pectoris: Secondary | ICD-10-CM | POA: Diagnosis not present

## 2014-07-31 DIAGNOSIS — K219 Gastro-esophageal reflux disease without esophagitis: Secondary | ICD-10-CM | POA: Diagnosis not present

## 2014-07-31 DIAGNOSIS — Z955 Presence of coronary angioplasty implant and graft: Secondary | ICD-10-CM | POA: Diagnosis not present

## 2014-07-31 DIAGNOSIS — Z8546 Personal history of malignant neoplasm of prostate: Secondary | ICD-10-CM | POA: Diagnosis not present

## 2014-07-31 DIAGNOSIS — I119 Hypertensive heart disease without heart failure: Secondary | ICD-10-CM | POA: Diagnosis not present

## 2014-07-31 DIAGNOSIS — E784 Other hyperlipidemia: Secondary | ICD-10-CM | POA: Diagnosis not present

## 2014-07-31 DIAGNOSIS — E668 Other obesity: Secondary | ICD-10-CM | POA: Diagnosis not present

## 2014-07-31 DIAGNOSIS — Z7901 Long term (current) use of anticoagulants: Secondary | ICD-10-CM | POA: Diagnosis not present

## 2014-08-03 ENCOUNTER — Other Ambulatory Visit: Payer: Self-pay

## 2014-08-04 DIAGNOSIS — R600 Localized edema: Secondary | ICD-10-CM | POA: Diagnosis not present

## 2014-08-04 DIAGNOSIS — J4530 Mild persistent asthma, uncomplicated: Secondary | ICD-10-CM | POA: Diagnosis not present

## 2014-08-04 DIAGNOSIS — G4730 Sleep apnea, unspecified: Secondary | ICD-10-CM | POA: Diagnosis not present

## 2014-08-14 DIAGNOSIS — I2510 Atherosclerotic heart disease of native coronary artery without angina pectoris: Secondary | ICD-10-CM | POA: Diagnosis not present

## 2014-08-14 DIAGNOSIS — E784 Other hyperlipidemia: Secondary | ICD-10-CM | POA: Diagnosis not present

## 2014-08-14 DIAGNOSIS — I350 Nonrheumatic aortic (valve) stenosis: Secondary | ICD-10-CM | POA: Diagnosis not present

## 2014-08-14 DIAGNOSIS — Z23 Encounter for immunization: Secondary | ICD-10-CM | POA: Diagnosis not present

## 2014-08-14 DIAGNOSIS — J4530 Mild persistent asthma, uncomplicated: Secondary | ICD-10-CM | POA: Diagnosis not present

## 2014-08-14 DIAGNOSIS — N183 Chronic kidney disease, stage 3 (moderate): Secondary | ICD-10-CM | POA: Diagnosis not present

## 2014-08-14 DIAGNOSIS — E785 Hyperlipidemia, unspecified: Secondary | ICD-10-CM | POA: Diagnosis not present

## 2014-08-14 DIAGNOSIS — Z955 Presence of coronary angioplasty implant and graft: Secondary | ICD-10-CM | POA: Diagnosis not present

## 2014-08-14 DIAGNOSIS — Z86718 Personal history of other venous thrombosis and embolism: Secondary | ICD-10-CM | POA: Diagnosis not present

## 2014-08-14 DIAGNOSIS — E668 Other obesity: Secondary | ICD-10-CM | POA: Diagnosis not present

## 2014-08-14 DIAGNOSIS — Z7901 Long term (current) use of anticoagulants: Secondary | ICD-10-CM | POA: Diagnosis not present

## 2014-08-14 DIAGNOSIS — I119 Hypertensive heart disease without heart failure: Secondary | ICD-10-CM | POA: Diagnosis not present

## 2014-08-14 DIAGNOSIS — K219 Gastro-esophageal reflux disease without esophagitis: Secondary | ICD-10-CM | POA: Diagnosis not present

## 2014-08-14 DIAGNOSIS — Z8546 Personal history of malignant neoplasm of prostate: Secondary | ICD-10-CM | POA: Diagnosis not present

## 2014-08-17 DIAGNOSIS — M545 Low back pain: Secondary | ICD-10-CM | POA: Diagnosis not present

## 2014-08-17 DIAGNOSIS — M9905 Segmental and somatic dysfunction of pelvic region: Secondary | ICD-10-CM | POA: Diagnosis not present

## 2014-08-17 DIAGNOSIS — M62830 Muscle spasm of back: Secondary | ICD-10-CM | POA: Diagnosis not present

## 2014-08-17 DIAGNOSIS — M9903 Segmental and somatic dysfunction of lumbar region: Secondary | ICD-10-CM | POA: Diagnosis not present

## 2014-09-08 ENCOUNTER — Other Ambulatory Visit: Payer: Self-pay | Admitting: Cardiology

## 2014-09-08 DIAGNOSIS — Z955 Presence of coronary angioplasty implant and graft: Secondary | ICD-10-CM | POA: Diagnosis not present

## 2014-09-08 DIAGNOSIS — Z7901 Long term (current) use of anticoagulants: Secondary | ICD-10-CM | POA: Diagnosis not present

## 2014-09-08 DIAGNOSIS — I2510 Atherosclerotic heart disease of native coronary artery without angina pectoris: Secondary | ICD-10-CM | POA: Diagnosis not present

## 2014-09-08 DIAGNOSIS — Z86718 Personal history of other venous thrombosis and embolism: Secondary | ICD-10-CM | POA: Diagnosis not present

## 2014-09-08 DIAGNOSIS — K219 Gastro-esophageal reflux disease without esophagitis: Secondary | ICD-10-CM | POA: Diagnosis not present

## 2014-09-08 DIAGNOSIS — Z8546 Personal history of malignant neoplasm of prostate: Secondary | ICD-10-CM | POA: Diagnosis not present

## 2014-09-08 DIAGNOSIS — I350 Nonrheumatic aortic (valve) stenosis: Secondary | ICD-10-CM | POA: Diagnosis not present

## 2014-09-08 DIAGNOSIS — R0600 Dyspnea, unspecified: Secondary | ICD-10-CM

## 2014-09-08 DIAGNOSIS — E668 Other obesity: Secondary | ICD-10-CM | POA: Diagnosis not present

## 2014-09-08 DIAGNOSIS — R0602 Shortness of breath: Secondary | ICD-10-CM | POA: Diagnosis not present

## 2014-09-08 DIAGNOSIS — I119 Hypertensive heart disease without heart failure: Secondary | ICD-10-CM | POA: Diagnosis not present

## 2014-09-08 DIAGNOSIS — E784 Other hyperlipidemia: Secondary | ICD-10-CM | POA: Diagnosis not present

## 2014-09-11 ENCOUNTER — Other Ambulatory Visit: Payer: Self-pay | Admitting: Cardiology

## 2014-09-11 DIAGNOSIS — K219 Gastro-esophageal reflux disease without esophagitis: Secondary | ICD-10-CM | POA: Diagnosis not present

## 2014-09-11 DIAGNOSIS — Z7901 Long term (current) use of anticoagulants: Secondary | ICD-10-CM | POA: Diagnosis not present

## 2014-09-11 DIAGNOSIS — E668 Other obesity: Secondary | ICD-10-CM | POA: Diagnosis not present

## 2014-09-11 DIAGNOSIS — R0602 Shortness of breath: Secondary | ICD-10-CM | POA: Diagnosis not present

## 2014-09-11 DIAGNOSIS — R0600 Dyspnea, unspecified: Secondary | ICD-10-CM

## 2014-09-11 DIAGNOSIS — I481 Persistent atrial fibrillation: Secondary | ICD-10-CM | POA: Diagnosis not present

## 2014-09-11 DIAGNOSIS — I350 Nonrheumatic aortic (valve) stenosis: Secondary | ICD-10-CM | POA: Diagnosis not present

## 2014-09-11 DIAGNOSIS — Z8546 Personal history of malignant neoplasm of prostate: Secondary | ICD-10-CM | POA: Diagnosis not present

## 2014-09-11 DIAGNOSIS — I119 Hypertensive heart disease without heart failure: Secondary | ICD-10-CM | POA: Diagnosis not present

## 2014-09-11 DIAGNOSIS — Z955 Presence of coronary angioplasty implant and graft: Secondary | ICD-10-CM | POA: Diagnosis not present

## 2014-09-11 DIAGNOSIS — I2510 Atherosclerotic heart disease of native coronary artery without angina pectoris: Secondary | ICD-10-CM | POA: Diagnosis not present

## 2014-09-11 DIAGNOSIS — Z86718 Personal history of other venous thrombosis and embolism: Secondary | ICD-10-CM | POA: Diagnosis not present

## 2014-09-11 DIAGNOSIS — E784 Other hyperlipidemia: Secondary | ICD-10-CM | POA: Diagnosis not present

## 2014-09-14 DIAGNOSIS — R0602 Shortness of breath: Secondary | ICD-10-CM | POA: Diagnosis not present

## 2014-09-14 DIAGNOSIS — R0609 Other forms of dyspnea: Secondary | ICD-10-CM | POA: Diagnosis not present

## 2014-09-14 DIAGNOSIS — I481 Persistent atrial fibrillation: Secondary | ICD-10-CM | POA: Diagnosis not present

## 2014-09-14 DIAGNOSIS — I2510 Atherosclerotic heart disease of native coronary artery without angina pectoris: Secondary | ICD-10-CM | POA: Diagnosis not present

## 2014-09-14 DIAGNOSIS — Z7901 Long term (current) use of anticoagulants: Secondary | ICD-10-CM | POA: Diagnosis not present

## 2014-09-14 DIAGNOSIS — I350 Nonrheumatic aortic (valve) stenosis: Secondary | ICD-10-CM | POA: Diagnosis not present

## 2014-09-15 ENCOUNTER — Ambulatory Visit (HOSPITAL_COMMUNITY)
Admission: RE | Admit: 2014-09-15 | Discharge: 2014-09-15 | Disposition: A | Discharge status: Home / Self Care | Payer: Medicare Other | Source: Ambulatory Visit | Attending: Cardiology | Admitting: Cardiology

## 2014-09-15 DIAGNOSIS — R0600 Dyspnea, unspecified: Secondary | ICD-10-CM

## 2014-09-15 DIAGNOSIS — I350 Nonrheumatic aortic (valve) stenosis: Secondary | ICD-10-CM | POA: Diagnosis not present

## 2014-09-15 DIAGNOSIS — I2510 Atherosclerotic heart disease of native coronary artery without angina pectoris: Secondary | ICD-10-CM | POA: Diagnosis not present

## 2014-09-15 DIAGNOSIS — N183 Chronic kidney disease, stage 3 (moderate): Secondary | ICD-10-CM | POA: Diagnosis not present

## 2014-09-15 DIAGNOSIS — Z7951 Long term (current) use of inhaled steroids: Secondary | ICD-10-CM | POA: Diagnosis not present

## 2014-09-15 DIAGNOSIS — I4891 Unspecified atrial fibrillation: Secondary | ICD-10-CM | POA: Diagnosis not present

## 2014-09-15 DIAGNOSIS — Z79899 Other long term (current) drug therapy: Secondary | ICD-10-CM | POA: Diagnosis not present

## 2014-09-15 DIAGNOSIS — E782 Mixed hyperlipidemia: Secondary | ICD-10-CM | POA: Diagnosis not present

## 2014-09-15 DIAGNOSIS — R0602 Shortness of breath: Secondary | ICD-10-CM | POA: Diagnosis not present

## 2014-09-15 DIAGNOSIS — R0789 Other chest pain: Secondary | ICD-10-CM | POA: Diagnosis not present

## 2014-09-15 DIAGNOSIS — K219 Gastro-esophageal reflux disease without esophagitis: Secondary | ICD-10-CM | POA: Diagnosis not present

## 2014-09-15 DIAGNOSIS — I129 Hypertensive chronic kidney disease with stage 1 through stage 4 chronic kidney disease, or unspecified chronic kidney disease: Secondary | ICD-10-CM | POA: Diagnosis not present

## 2014-09-15 DIAGNOSIS — Z86718 Personal history of other venous thrombosis and embolism: Secondary | ICD-10-CM | POA: Diagnosis not present

## 2014-09-15 DIAGNOSIS — Z86711 Personal history of pulmonary embolism: Secondary | ICD-10-CM | POA: Diagnosis not present

## 2014-09-15 DIAGNOSIS — Z87891 Personal history of nicotine dependence: Secondary | ICD-10-CM | POA: Diagnosis not present

## 2014-09-15 DIAGNOSIS — E669 Obesity, unspecified: Secondary | ICD-10-CM | POA: Diagnosis not present

## 2014-09-15 DIAGNOSIS — R079 Chest pain, unspecified: Secondary | ICD-10-CM | POA: Diagnosis not present

## 2014-09-15 DIAGNOSIS — J449 Chronic obstructive pulmonary disease, unspecified: Secondary | ICD-10-CM | POA: Diagnosis not present

## 2014-09-15 DIAGNOSIS — Z8546 Personal history of malignant neoplasm of prostate: Secondary | ICD-10-CM | POA: Diagnosis not present

## 2014-09-15 DIAGNOSIS — G4733 Obstructive sleep apnea (adult) (pediatric): Secondary | ICD-10-CM | POA: Diagnosis not present

## 2014-09-15 MED ORDER — TECHNETIUM TC 99M DIETHYLENETRIAME-PENTAACETIC ACID: 37.2000 | Freq: Once | INTRAVENOUS | Status: DC | PRN

## 2014-09-15 MED ORDER — TECHNETIUM TO 99M ALBUMIN AGGREGATED
5.9000 | Freq: Once | INTRAVENOUS | Status: AC | PRN
  Administered 2014-09-15: 6 via INTRAVENOUS

## 2014-09-18 ENCOUNTER — Emergency Department (HOSPITAL_COMMUNITY): Payer: Medicare Other

## 2014-09-18 ENCOUNTER — Other Ambulatory Visit: Payer: Self-pay

## 2014-09-18 ENCOUNTER — Observation Stay (HOSPITAL_COMMUNITY)
Admission: EM | Admit: 2014-09-18 | Discharge: 2014-09-19 | Disposition: A | Discharge status: Home / Self Care | Payer: Medicare Other | Attending: Cardiovascular Disease | Admitting: Cardiovascular Disease

## 2014-09-18 ENCOUNTER — Encounter (HOSPITAL_COMMUNITY): Payer: Self-pay | Admitting: *Deleted

## 2014-09-18 DIAGNOSIS — R079 Chest pain, unspecified: Secondary | ICD-10-CM | POA: Diagnosis not present

## 2014-09-18 DIAGNOSIS — G4733 Obstructive sleep apnea (adult) (pediatric): Secondary | ICD-10-CM | POA: Insufficient documentation

## 2014-09-18 DIAGNOSIS — Z87891 Personal history of nicotine dependence: Secondary | ICD-10-CM | POA: Insufficient documentation

## 2014-09-18 DIAGNOSIS — Z8546 Personal history of malignant neoplasm of prostate: Secondary | ICD-10-CM | POA: Insufficient documentation

## 2014-09-18 DIAGNOSIS — E669 Obesity, unspecified: Secondary | ICD-10-CM | POA: Insufficient documentation

## 2014-09-18 DIAGNOSIS — R0602 Shortness of breath: Secondary | ICD-10-CM | POA: Diagnosis not present

## 2014-09-18 DIAGNOSIS — I4819 Other persistent atrial fibrillation: Secondary | ICD-10-CM | POA: Diagnosis present

## 2014-09-18 DIAGNOSIS — E782 Mixed hyperlipidemia: Secondary | ICD-10-CM | POA: Diagnosis present

## 2014-09-18 DIAGNOSIS — I200 Unstable angina: Secondary | ICD-10-CM | POA: Diagnosis present

## 2014-09-18 DIAGNOSIS — K219 Gastro-esophageal reflux disease without esophagitis: Secondary | ICD-10-CM | POA: Insufficient documentation

## 2014-09-18 DIAGNOSIS — R0789 Other chest pain: Secondary | ICD-10-CM | POA: Diagnosis present

## 2014-09-18 DIAGNOSIS — Z86711 Personal history of pulmonary embolism: Secondary | ICD-10-CM | POA: Insufficient documentation

## 2014-09-18 DIAGNOSIS — I209 Angina pectoris, unspecified: Secondary | ICD-10-CM | POA: Diagnosis present

## 2014-09-18 DIAGNOSIS — I350 Nonrheumatic aortic (valve) stenosis: Secondary | ICD-10-CM | POA: Insufficient documentation

## 2014-09-18 DIAGNOSIS — Z7951 Long term (current) use of inhaled steroids: Secondary | ICD-10-CM | POA: Insufficient documentation

## 2014-09-18 DIAGNOSIS — J45909 Unspecified asthma, uncomplicated: Secondary | ICD-10-CM | POA: Diagnosis present

## 2014-09-18 DIAGNOSIS — N183 Chronic kidney disease, stage 3 (moderate): Secondary | ICD-10-CM | POA: Insufficient documentation

## 2014-09-18 DIAGNOSIS — I2510 Atherosclerotic heart disease of native coronary artery without angina pectoris: Secondary | ICD-10-CM | POA: Insufficient documentation

## 2014-09-18 DIAGNOSIS — I119 Hypertensive heart disease without heart failure: Secondary | ICD-10-CM | POA: Diagnosis present

## 2014-09-18 DIAGNOSIS — I4891 Unspecified atrial fibrillation: Secondary | ICD-10-CM | POA: Insufficient documentation

## 2014-09-18 DIAGNOSIS — I129 Hypertensive chronic kidney disease with stage 1 through stage 4 chronic kidney disease, or unspecified chronic kidney disease: Secondary | ICD-10-CM | POA: Insufficient documentation

## 2014-09-18 DIAGNOSIS — J449 Chronic obstructive pulmonary disease, unspecified: Secondary | ICD-10-CM | POA: Diagnosis not present

## 2014-09-18 DIAGNOSIS — Z86718 Personal history of other venous thrombosis and embolism: Secondary | ICD-10-CM | POA: Insufficient documentation

## 2014-09-18 DIAGNOSIS — Z79899 Other long term (current) drug therapy: Secondary | ICD-10-CM | POA: Insufficient documentation

## 2014-09-18 DIAGNOSIS — N179 Acute kidney failure, unspecified: Secondary | ICD-10-CM | POA: Diagnosis present

## 2014-09-18 HISTORY — DX: Chronic kidney disease, stage 3 (moderate): N18.3

## 2014-09-18 HISTORY — DX: Chronic kidney disease, stage 3 unspecified: N18.30

## 2014-09-18 LAB — BASIC METABOLIC PANEL
Anion gap: 7 (ref 5–15)
BUN: 32 mg/dL — ABNORMAL HIGH (ref 6–20)
CO2: 25 mmol/L (ref 22–32)
Calcium: 10.1 mg/dL (ref 8.9–10.3)
Chloride: 110 mmol/L (ref 101–111)
Creatinine, Ser: 2 mg/dL — ABNORMAL HIGH (ref 0.61–1.24)
GFR calc Af Amer: 34 mL/min — ABNORMAL LOW (ref >60)
GFR calc non Af Amer: 30 mL/min — ABNORMAL LOW (ref >60)
Glucose, Bld: 132 mg/dL — ABNORMAL HIGH (ref 65–99)
Potassium: 4.6 mmol/L (ref 3.5–5.1)
SODIUM: 142 mmol/L (ref 135–145)

## 2014-09-18 LAB — CBC
HCT: 45.3 % (ref 39.0–52.0)
Hemoglobin: 14.9 g/dL (ref 13.0–17.0)
MCH: 26.5 pg (ref 26.0–34.0)
MCHC: 32.9 g/dL (ref 30.0–36.0)
MCV: 80.6 fL (ref 78.0–100.0)
Platelets: 173 10*3/uL (ref 150–400)
RBC: 5.62 MIL/uL (ref 4.22–5.81)
RDW: 15.9 % — AB (ref 11.5–15.5)
WBC: 4.9 10*3/uL (ref 4.0–10.5)

## 2014-09-18 LAB — I-STAT TROPONIN, ED
Troponin i, poc: 0.02 ng/mL (ref 0.00–0.08)
Troponin i, poc: 0.01 ng/mL (ref 0.00–0.08)

## 2014-09-18 LAB — PROTIME-INR
INR: 2.22 — ABNORMAL HIGH (ref 0.00–1.49)
Prothrombin Time: 24.4 seconds — ABNORMAL HIGH (ref 11.6–15.2)

## 2014-09-18 LAB — BRAIN NATRIURETIC PEPTIDE: B Natriuretic Peptide: 1066.2 pg/mL — ABNORMAL HIGH (ref 0.0–100.0)

## 2014-09-18 NOTE — ED Notes (Signed)
Dr. Colin Rhein notified of urine incontinence and discontinued order for urine.

## 2014-09-18 NOTE — ED Notes (Signed)
Patient states he was told he has atrial fib 2 weeks ago and breathing problems.  Today patient reports he noted onset of mid chest pain.  Patient states he is feeling more sob today.  Patient denies nausea.  He states he has had periods of dizziness.  Patient was started on new medication norvasc.  Patient is alert and oriented.

## 2014-09-18 NOTE — ED Notes (Signed)
Patient informed we needed a urine specimen.  Patient states that he is incontinent and does not wish to have a catheter.

## 2014-09-18 NOTE — ED Notes (Signed)
Attempted report 

## 2014-09-18 NOTE — ED Provider Notes (Signed)
CSN: [id number]     Arrival date & time 09/18/14  1614 History   First MD Initiated Contact with Patient 09/18/14 1635     Chief Complaint  Patient presents with  . Chest Pain     (Consider location/radiation/quality/duration/timing/severity/associated sxs/prior Treatment) Patient is a 81 y.o. male presenting with chest pain.  Chest Pain Pain location:  Epigastric Pain quality: pressure   Pain radiates to:  Does not radiate Pain radiates to the back: no   Pain severity:  Moderate Onset quality:  Gradual Duration:  8 hours Timing:  Constant Progression:  Unchanged Chronicity:  New Context: at rest   Context: not breathing   Relieved by:  Nothing Worsened by:  Nothing tried Ineffective treatments:  None tried Associated symptoms: no abdominal pain, no fever, no nausea, no shortness of breath, no syncope and not vomiting     Past Medical History  Diagnosis Date  . Mixed hyperlipidemia   . GERD   . Urinary incontinence     MULTIPLE BLADDER SURGERIES - STATES NO URINARY SPHINCTER - PT'S UROLOGIST IS AT [facility]- DR. PETERSON  ( LAST VISIT WAS 09/15/11 )  . Aortic stenosis     a. Echo (06/2013): Severe LVH, EF 55-60%, mild to moderate AS (mean 15 mm Hg), mild AI, mild LAE  . History of prostate cancer     Radical prostatectomy in 2006 Dr. Terance Hart   . History of infection of prosthetic left knee joint     Treated with debridement and irrigation followed by 6 months of triple antibiotics   . Obesity (BMI 30-39.9)   . Incontinence of urine     Multiple urologic procedures   . Hypertensive heart disease   . Aortic stenosis     Mild-mod by echo 06/2013  . 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)     Initial DVT in 2006 after prostate surgery and had Greenfield filter placed Bilateral  PE 2013 and placed back on warfarin DVT of right subclavian vein on doppler 10/2012 at time of knee infection   . CAD (coronary artery disease), native coronary artery    Cath 2011 LHC (08/2009):~ Proximal LAD 30%, mid to distal LAD 25%, ostial small D1 75% mid AV groove circumflex 99% been subtotal stenosis, proximal to mid RCA 25-30%, mid RCA 30%, mid PDA 30%, EF 50% with inferior hypokinesis.;    July 2011  PCI and DES to circumflex Dr. Olevia Perches   ETT-Myoview (06/2013):  Inferolateral scar, mild peri-infarct ischemia, EF 40%; Intermediate Risk   ECHO EF 55% 03/2013   . Atrial fibrillation     Dx 2016, placed on amiodarone  . Hypertension   . History of DVT (deep vein thrombosis)     DVT in setting of prostatectomy in 2006, DVT of R Harper vein 10/2012 in the setting of knee infection  . History of pulmonary embolism     bilateral PE in 2013 s/p Greenfield filter  . CKD (chronic kidney disease), stage III   . OSA (obstructive sleep apnea)    Past Surgical History  Procedure Laterality Date  . Prostatectomy  04/25/2004  . Replacement total knee  bilateral  . Hernia repair    . Vena cava filter placement    . Cardiac catheterization  2011    DES-mid LCx 08/2009; 30% pLAD, 25% m/dLAD, 75% ostial D1, 99% mLCx s/p DES, 25-30% p/mRCA, 40% mRCA, 30% mPDA stenoses; LVEF 50%, inf hypokinesis  . Coronary angioplasty  08/25/09  STENT PLACEMENT  . Knee arthrotomy Left 10/09/2012    Procedure: LEFT KNEE ARTHROTOMY;  Surgeon: Gearlean Alf, MD;  Location: [facility];  Service: Orthopedics;  Laterality: Left;  . I&d knee with poly exchange Left 10/09/2012    Procedure: IRRIGATION AND DEBRIDEMENT LEFT KNEE WITH POLY REVISION;  Surgeon: Gearlean Alf, MD;  Location: [facility];  Service: Orthopedics;  Laterality: Left;  . Hemorrhoid surgery    . Uretheral implants      multiple for incontinence  . Hernia repair     No family history on file. Social History  Substance Use Topics  . Smoking status: Former Smoker    Quit date: 04/27/1961  . Smokeless tobacco: Never Used  . Alcohol Use: No    Review of Systems  Constitutional: Negative for fever.  Respiratory: Negative for  shortness of breath.   Cardiovascular: Positive for chest pain. Negative for syncope.  Gastrointestinal: Negative for nausea, vomiting and abdominal pain.  All other systems reviewed and are negative.     Allergies  Darifenacin hydrobromide er  Home Medications   Prior to Admission medications   Medication Sig Start Date End Date Taking? Authorizing Provider  albuterol (PROVENTIL) (2.5 MG/3ML) 0.083% nebulizer solution Take 2.5 mg by nebulization 2 (two) times daily.   Yes Historical Provider, MD  amiodarone (PACERONE) 200 MG tablet Take 200 mg by mouth 2 (two) times daily.    Yes Historical Provider, MD  budesonide-formoterol (SYMBICORT) 160-4.5 MCG/ACT inhaler Inhale 2 puffs into the lungs 2 (two) times daily.   Yes Historical Provider, MD  cholecalciferol (VITAMIN D) 1000 UNITS tablet Take 1,000 Units by mouth daily.   Yes Historical Provider, MD  Cyanocobalamin (B-12) 1000 MCG CAPS Take 1 tablet by mouth daily.    Yes Historical Provider, MD  Diphenhydramine-APAP, sleep, (EXCEDRIN PM PO) Take 1 tablet by mouth at bedtime.    Yes Historical Provider, MD  esomeprazole (NEXIUM) 40 MG capsule Take 40 mg by mouth daily before breakfast.    Yes Historical Provider, MD  fenofibrate 160 MG tablet Take 1 tablet (160 mg total) by mouth daily. Patient taking differently: Take 160 mg by mouth at bedtime.  12/25/13  Yes Minus Breeding, MD  furosemide (LASIX) 20 MG tablet Take 20 mg by mouth daily.   Yes Historical Provider, MD  Misc Natural Products (GLUCOSAMINE CHONDROITIN ADV PO) Take 1 tablet by mouth daily.    Yes Historical Provider, MD  Multiple Vitamin (MULTIVITAMIN) capsule Take 1 capsule by mouth daily.    Yes Historical Provider, MD  nitroGLYCERIN (NITROSTAT) 0.4 MG SL tablet Place 1 tablet (0.4 mg total) under the tongue every 5 (five) minutes as needed. 08/27/13  Yes Minus Breeding, MD  Omega-3 Fatty Acids (FISH OIL CONCENTRATE PO) Take 2 capsules by mouth daily.    Yes Historical  Provider, MD  pravastatin (PRAVACHOL) 20 MG tablet TAKE 1 TABLET BY MOUTH EVERY DAY Patient taking differently: TAKE 1 TABLET BY MOUTH EVERY evening 12/24/13  Yes Minus Breeding, MD  felodipine (PLENDIL) 2.5 MG 24 hr tablet Take 1 tablet (2.5 mg total) by mouth daily. 09/19/14   Dayna N Dunn, PA-C  warfarin (COUMADIN) 5 MG tablet Take 0.5 tablets (2.5 mg total) by mouth daily. 09/19/14   Dayna N Dunn, PA-C   BP 135/74 mmHg  Pulse 55  Temp(Src) 97.8 F (36.6 C) (Oral)  Resp 18  Ht 6' (1.829 m)  Wt 249 lb 1.6 oz (112.991 kg)  BMI 33.78 kg/m2  SpO2 93%  Physical Exam  Constitutional: He is oriented to person, place, and time. He appears well-developed and well-nourished.  HENT:  Head: Normocephalic and atraumatic.  Eyes: Conjunctivae and EOM are normal.  Neck: Normal range of motion. Neck supple.  Cardiovascular: Normal rate, regular rhythm and normal heart sounds.   Pulmonary/Chest: Effort normal and breath sounds normal. No respiratory distress.  Abdominal: He exhibits no distension. There is no tenderness. There is no rebound and no guarding.  Musculoskeletal: Normal range of motion.  Neurological: He is alert and oriented to person, place, and time.  Skin: Skin is warm and dry.  Vitals reviewed.   ED Course  Procedures (including critical care time) Labs Review Labs Reviewed  BASIC METABOLIC PANEL - Abnormal; Notable for the following:    Glucose, Bld 132 (*)    BUN 32 (*)    Creatinine, Ser 2.00 (*)    GFR calc non Af Amer 30 (*)    GFR calc Af Amer 34 (*)    All other components within normal limits  CBC - Abnormal; Notable for the following:    RDW 15.9 (*)    All other components within normal limits  PROTIME-INR - Abnormal; Notable for the following:    Prothrombin Time 24.4 (*)    INR 2.22 (*)    All other components within normal limits  BRAIN NATRIURETIC PEPTIDE - Abnormal; Notable for the following:    B Natriuretic Peptide 1066.2 (*)    All other components  within normal limits  URINALYSIS, ROUTINE W REFLEX MICROSCOPIC (NOT AT [facility]) - Abnormal; Notable for the following:    Leukocytes, UA SMALL (*)    All other components within normal limits  COMPREHENSIVE METABOLIC PANEL - Abnormal; Notable for the following:    Glucose, Bld 104 (*)    BUN 26 (*)    Creatinine, Ser 1.80 (*)    Total Protein 6.3 (*)    ALT 10 (*)    Alkaline Phosphatase 34 (*)    Total Bilirubin 1.4 (*)    GFR calc non Af Amer 34 (*)    GFR calc Af Amer 39 (*)    All other components within normal limits  CBC - Abnormal; Notable for the following:    RDW 16.0 (*)    All other components within normal limits  PROTIME-INR - Abnormal; Notable for the following:    Prothrombin Time 23.0 (*)    INR 2.05 (*)    All other components within normal limits  URINE MICROSCOPIC-ADD ON - Abnormal; Notable for the following:    Bacteria, UA FEW (*)    All other components within normal limits  UREA NITROGEN, URINE  CREATININE, URINE, RANDOM  TROPONIN I  I-STAT TROPOININ, ED  I-STAT TROPOININ, ED    Imaging Review No results found. IDebby Freiberg, personally reviewed and evaluated these images and lab results as part of my medical decision-making.   EKG Interpretation   Date/Time:  Friday September 18 2014 16:21:16 EDT Ventricular Rate:  56 PR Interval:    QRS Duration: 96 QT Interval:  430 QTC Calculation: 414 R Axis:   -8 Text Interpretation:  Atrial fibrillation with slow ventricular response  with premature ventricular or aberrantly conducted complexes , new since  last tracing Nonspecific ST and T wave abnormality Abnormal ECG Confirmed  by Debby Freiberg [phone]) on 09/18/2014 5:33:32 PM      MDM   Final diagnoses:  Chest pain, unspecified chest pain type    81 y.o.  male with pertinent PMH of CAD, afib (recently started on amiodarone), aortic stenosis presents with chest pain as above.  Pain atypical for ACS, but pt with risk factors.  Exam benign.  Wu  as above.  Delta trop negative.  Consulted cardiology as pt is still having intermittent pain.  Admitted in stable condition.  I have reviewed all laboratory and imaging studies if ordered as above  1. Chest pain, unspecified chest pain type         Debby Freiberg, MD 09/21/14 1727

## 2014-09-19 ENCOUNTER — Encounter (HOSPITAL_COMMUNITY): Payer: Self-pay | Admitting: Internal Medicine

## 2014-09-19 DIAGNOSIS — I4819 Other persistent atrial fibrillation: Secondary | ICD-10-CM

## 2014-09-19 DIAGNOSIS — J4520 Mild intermittent asthma, uncomplicated: Secondary | ICD-10-CM

## 2014-09-19 DIAGNOSIS — R0789 Other chest pain: Secondary | ICD-10-CM | POA: Diagnosis present

## 2014-09-19 DIAGNOSIS — J45909 Unspecified asthma, uncomplicated: Secondary | ICD-10-CM | POA: Diagnosis present

## 2014-09-19 DIAGNOSIS — R079 Chest pain, unspecified: Secondary | ICD-10-CM | POA: Diagnosis not present

## 2014-09-19 DIAGNOSIS — I4891 Unspecified atrial fibrillation: Secondary | ICD-10-CM | POA: Diagnosis not present

## 2014-09-19 DIAGNOSIS — I480 Paroxysmal atrial fibrillation: Secondary | ICD-10-CM

## 2014-09-19 DIAGNOSIS — I110 Hypertensive heart disease with heart failure: Secondary | ICD-10-CM

## 2014-09-19 DIAGNOSIS — I509 Heart failure, unspecified: Secondary | ICD-10-CM

## 2014-09-19 DIAGNOSIS — S37009A Unspecified injury of unspecified kidney, initial encounter: Secondary | ICD-10-CM | POA: Insufficient documentation

## 2014-09-19 DIAGNOSIS — E782 Mixed hyperlipidemia: Secondary | ICD-10-CM

## 2014-09-19 DIAGNOSIS — Z86718 Personal history of other venous thrombosis and embolism: Secondary | ICD-10-CM

## 2014-09-19 DIAGNOSIS — J4540 Moderate persistent asthma, uncomplicated: Secondary | ICD-10-CM

## 2014-09-19 DIAGNOSIS — N179 Acute kidney failure, unspecified: Secondary | ICD-10-CM | POA: Diagnosis not present

## 2014-09-19 HISTORY — DX: Other persistent atrial fibrillation: I48.19

## 2014-09-19 HISTORY — DX: Moderate persistent asthma, uncomplicated: J45.40

## 2014-09-19 LAB — URINE MICROSCOPIC-ADD ON

## 2014-09-19 LAB — CREATININE, URINE, RANDOM: Creatinine, Urine: 35.01 mg/dL

## 2014-09-19 LAB — URINALYSIS, ROUTINE W REFLEX MICROSCOPIC
BILIRUBIN URINE: NEGATIVE
GLUCOSE, UA: NEGATIVE mg/dL
Hgb urine dipstick: NEGATIVE
KETONES UR: NEGATIVE mg/dL
NITRITE: NEGATIVE
PROTEIN: NEGATIVE mg/dL
SPECIFIC GRAVITY, URINE: 1.008 (ref 1.005–1.030)
Urobilinogen, UA: 0.2 mg/dL (ref 0.0–1.0)
pH: 6.5 (ref 5.0–8.0)

## 2014-09-19 LAB — COMPREHENSIVE METABOLIC PANEL
ALBUMIN: 3.5 g/dL (ref 3.5–5.0)
ALK PHOS: 34 U/L — AB (ref 38–126)
ALT: 10 U/L — ABNORMAL LOW (ref 17–63)
ANION GAP: 6 (ref 5–15)
AST: 24 U/L (ref 15–41)
BUN: 26 mg/dL — ABNORMAL HIGH (ref 6–20)
CALCIUM: 9.6 mg/dL (ref 8.9–10.3)
CO2: 26 mmol/L (ref 22–32)
Chloride: 106 mmol/L (ref 101–111)
Creatinine, Ser: 1.8 mg/dL — ABNORMAL HIGH (ref 0.61–1.24)
GFR calc Af Amer: 39 mL/min — ABNORMAL LOW (ref >60)
GFR, EST NON AFRICAN AMERICAN: 34 mL/min — AB (ref >60)
GLUCOSE: 104 mg/dL — AB (ref 65–99)
Potassium: 4.1 mmol/L (ref 3.5–5.1)
Sodium: 138 mmol/L (ref 135–145)
TOTAL PROTEIN: 6.3 g/dL — AB (ref 6.5–8.1)
Total Bilirubin: 1.4 mg/dL — ABNORMAL HIGH (ref 0.3–1.2)

## 2014-09-19 LAB — PROTIME-INR
INR: 2.05 — AB (ref 0.00–1.49)
Prothrombin Time: 23 seconds — ABNORMAL HIGH (ref 11.6–15.2)

## 2014-09-19 LAB — CBC
HEMATOCRIT: 46.1 % (ref 39.0–52.0)
Hemoglobin: 15.3 g/dL (ref 13.0–17.0)
MCH: 26.8 pg (ref 26.0–34.0)
MCHC: 33.2 g/dL (ref 30.0–36.0)
MCV: 80.7 fL (ref 78.0–100.0)
PLATELETS: 168 10*3/uL (ref 150–400)
RBC: 5.71 MIL/uL (ref 4.22–5.81)
RDW: 16 % — AB (ref 11.5–15.5)
WBC: 5.6 10*3/uL (ref 4.0–10.5)

## 2014-09-19 LAB — TROPONIN I: Troponin I: 0.03 ng/mL (ref <0.031)

## 2014-09-19 MED ORDER — WARFARIN - PHYSICIAN DOSING INPATIENT: Freq: Every day | Status: DC

## 2014-09-19 MED ORDER — BUDESONIDE-FORMOTEROL FUMARATE 160-4.5 MCG/ACT IN AERO
2.0000 | INHALATION_SPRAY | Freq: Two times a day (BID) | RESPIRATORY_TRACT | Status: DC
  Filled 2014-09-19: qty 6

## 2014-09-19 MED ORDER — AMIODARONE HCL 200 MG PO TABS
200.0000 mg | ORAL_TABLET | Freq: Two times a day (BID) | ORAL | Status: DC
Start: 2014-09-19 — End: 2014-09-19
  Administered 2014-09-19: 200 mg via ORAL
  Filled 2014-09-19: qty 1

## 2014-09-19 MED ORDER — ASPIRIN EC 81 MG PO TBEC
81.0000 mg | DELAYED_RELEASE_TABLET | Freq: Every day | ORAL | Status: DC
  Administered 2014-09-19: 81 mg via ORAL
  Filled 2014-09-19: qty 1

## 2014-09-19 MED ORDER — ALBUTEROL SULFATE (2.5 MG/3ML) 0.083% IN NEBU
2.5000 mg | INHALATION_SOLUTION | Freq: Two times a day (BID) | RESPIRATORY_TRACT | Status: DC
  Filled 2014-09-19: qty 3

## 2014-09-19 MED ORDER — ACETAMINOPHEN 650 MG RE SUPP: 650.0000 mg | Freq: Four times a day (QID) | RECTAL | Status: DC | PRN

## 2014-09-19 MED ORDER — SODIUM CHLORIDE 0.9 % IJ SOLN
3.0000 mL | Freq: Two times a day (BID) | INTRAMUSCULAR | Status: DC
  Administered 2014-09-19: 3 mL via INTRAVENOUS

## 2014-09-19 MED ORDER — FUROSEMIDE 10 MG/ML IJ SOLN
20.0000 mg | Freq: Once | INTRAMUSCULAR | Status: AC
  Administered 2014-09-19: 20 mg via INTRAVENOUS
  Filled 2014-09-19: qty 2

## 2014-09-19 MED ORDER — WARFARIN SODIUM 2.5 MG PO TABS: 2.5000 mg | ORAL_TABLET | Freq: Every day | ORAL | Status: DC

## 2014-09-19 MED ORDER — ACETAMINOPHEN 325 MG PO TABS: 650.0000 mg | ORAL_TABLET | Freq: Four times a day (QID) | ORAL | Status: DC | PRN

## 2014-09-19 MED ORDER — FENOFIBRATE 160 MG PO TABS: 160.0000 mg | ORAL_TABLET | Freq: Every day | ORAL | Status: DC

## 2014-09-19 MED ORDER — ONDANSETRON HCL 4 MG PO TABS: 4.0000 mg | ORAL_TABLET | Freq: Four times a day (QID) | ORAL | Status: DC | PRN

## 2014-09-19 MED ORDER — ONDANSETRON HCL 4 MG/2ML IJ SOLN: 4.0000 mg | Freq: Four times a day (QID) | INTRAMUSCULAR | Status: DC | PRN

## 2014-09-19 MED ORDER — FELODIPINE ER 2.5 MG PO TB24: 2.5000 mg | ORAL_TABLET | Freq: Every day | ORAL | Status: DC

## 2014-09-19 MED ORDER — FELODIPINE ER 2.5 MG PO TB24
2.5000 mg | ORAL_TABLET | Freq: Every day | ORAL | Status: DC
  Administered 2014-09-19: 2.5 mg via ORAL
  Filled 2014-09-19: qty 1

## 2014-09-19 MED ORDER — PANTOPRAZOLE SODIUM 40 MG PO TBEC
40.0000 mg | DELAYED_RELEASE_TABLET | Freq: Every day | ORAL | Status: DC
  Administered 2014-09-19: 40 mg via ORAL
  Filled 2014-09-19: qty 1

## 2014-09-19 MED ORDER — WARFARIN SODIUM 5 MG PO TABS: 2.5000 mg | ORAL_TABLET | Freq: Every day | ORAL | Status: DC

## 2014-09-19 MED ORDER — PRAVASTATIN SODIUM 20 MG PO TABS: 20.0000 mg | ORAL_TABLET | Freq: Every evening | ORAL | Status: DC

## 2014-09-19 NOTE — Progress Notes (Signed)
Subjective:  No further CP (since admit)   Objective:  Vital Signs in the last 24 hours: Temp:  [97.5 F (36.4 C)-98.2 F (36.8 C)] 97.8 F (36.6 C) (08/13 0554) Pulse Rate:  [41-99] 55 (08/13 0554) Resp:  [13-36] 18 (08/13 0554) BP: (135-178)/(70-93) 135/74 mmHg (08/13 0746) SpO2:  [92 %-97 %] 93 % (08/13 0554) Weight:  [249 lb 1.6 oz (112.991 kg)-252 lb 2 oz (114.363 kg)] 249 lb 1.6 oz (112.991 kg) (08/12 2305)  Intake/Output from previous day:     Physical Exam: General: Well developed, well nourished, in no acute distress. Head:  Normocephalic and atraumatic. Lungs: Clear to auscultation and percussion. Heart: Normal S1 and S2.  No murmur, rubs or gallops.  Abdomen: soft, non-tender, positive bowel sounds. Extremities: No clubbing or cyanosis. No edema. Neurologic: Alert and oriented x 3.    Lab Results:  Recent Labs  09/18/14 1628 09/19/14 0646  WBC 4.9 5.6  HGB 14.9 15.3  PLT 173 168    Recent Labs  09/18/14 1628 09/19/14 0646  NA 142 138  K 4.6 4.1  CL 110 106  CO2 25 26  GLUCOSE 132* 104*  BUN 32* 26*  CREATININE 2.00* 1.80*    Recent Labs  09/19/14 0646  TROPONINI 0.03   Hepatic Function Panel  Recent Labs  09/19/14 0646  PROT 6.3*  ALBUMIN 3.5  AST 24  ALT 10*  ALKPHOS 34*  BILITOT 1.4*   No results for input(s): CHOL in the last 72 hours. No results for input(s): PROTIME in the last 72 hours.  Imaging: Dg Chest 2 View  09/18/2014   CLINICAL DATA:  Chest pain and shortness of breath today, coronary artery disease, atrial fibrillation, former smoker  EXAM: CHEST  2 VIEW  COMPARISON:  09/15/2014  FINDINGS: Enlargement of cardiac silhouette.  Atherosclerotic calcification and minimal elongation of thoracic aorta.  Mediastinal contours and pulmonary vascularity otherwise normal.  Emphysematous and bronchitic changes compatible with COPD.  Chronic accentuation of interstitial markings in mid to lower lung stable.  No acute  infiltrate, pleural effusion or pneumothorax.  Bones demineralized.  Surgical clips LEFT axilla.  IMPRESSION: Mild enlargement of cardiac silhouette.  COPD changes with chronic interstitial prominence.  No acute abnormalities.   Electronically Signed   By: Lavonia Dana M.D.   On: 09/18/2014 16:53   Personally viewed.   Telemetry: AFIB (at times heart rate 30's) Personally viewed.   EKG:  AFIB, 56, NSSTW changes, PVC  Cardiac Studies:  Echocardiogram in our system demonstrates normal ejection fraction with mild to moderate aortic stenosis on 06/24/13  Scheduled Meds: . albuterol  2.5 mg Nebulization BID  . amiodarone  200 mg Oral BID  . aspirin EC  81 mg Oral Daily  . budesonide-formoterol  2 puff Inhalation BID  . felodipine  2.5 mg Oral Daily  . fenofibrate  160 mg Oral QHS  . pantoprazole  40 mg Oral Daily  . pravastatin  20 mg Oral QPM  . sodium chloride  3 mL Intravenous Q12H  . warfarin  2.5 mg Oral q1800  . Warfarin - Physician Dosing Inpatient   Does not apply q1800   Continuous Infusions:  PRN Meds:.acetaminophen **OR** acetaminophen, ondansetron **OR** ondansetron (ZOFRAN) IV   Assessment/Plan:  Principal Problem:   Atypical chest pain Active Problems:   Hypertensive heart disease   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)   Mixed hyperlipidemia   Atrial fibrillation   AKI (acute  kidney injury)   Asthma, chronic  81 year old male patient of Dr. Thurman Coyer with coronary artery disease status post DES to left circumflex, mild to moderate aortic stenosis, recently diagnosed atrial fibrillation placed on amiodarone 2 weeks ago, hypertension, hyperlipidemia, history of DVT/PE in 2006, bilateral PE in 2013 with Greenfield filter, DVT in 2014 in the setting of knee infection, chronic kidney disease with baseline creatinine 1.3-1.4, OSA with observation for atypical chest pain.  1. Atypical chest pain - Troponins are normal, reassuring. EKG does not  show any evidence of ischemia.  - He has follow-up with Dr. Wynonia Lawman on Monday. At that point, he may desire to proceed with nuclear stress test.  - Patient and wife are agreeable to plan.  2. Atrial fibrillation  - Slow ventricular response noted on telemetry  - New start amiodarone 2 weeks ago  - He is on no other AV nodal blocking agents.  - He did not experience any syncope, dizziness. Continue with clinical monitoring for now. In the future, he may require pacemaker. Discussed with he and wife.  3. Aortic stenosis  - At least mild to moderate. Most recent echocardiogram at Dr. Thurman Coyer office, discussion pending  4. Dyspnea  - Extensive workup performed, see history and physical.  - Continue with furosemide 20 mg once a day  5. Chronic kidney disease stage III  - Close monitoring of creatinine especially in light of diuresis.  6. Chronic anticoagulation  - Continue warfarin  Okay with discharge home. Has close follow-up Monday with Dr. Wynonia Lawman.  Corinn Stoltzfus, [place] 09/19/2014, 10:02 AM

## 2014-09-19 NOTE — Discharge Summary (Signed)
Discharge Summary   Patient ID: [name] MRN: [id number], DOB/AGE: 05/08/1931 81 y.o. Admit date: 09/18/2014 D/C date:     09/19/2014  Primary Care Provider: Shirline Frees, MD Primary Cardiologist: [name]  Primary Discharge Diagnoses:  1. Atypical chest pain, possibly musculoskeletal 2. Atrial fibrillation 3. Aortic stenosis, at least mild-mod in 2015 4. Dyspnea 5. CKD stage III 6. Hypertension 7. Hyperlipidemia  Comprehensive PMH:  Past Medical History  Diagnosis Date  . Mixed hyperlipidemia   . GERD   . Urinary incontinence     MULTIPLE BLADDER SURGERIES - STATES NO URINARY SPHINCTER - PT'S UROLOGIST IS AT [facility]- DR. PETERSON  ( LAST VISIT WAS 09/15/11 )  . Aortic stenosis     a. Echo (06/2013): Severe LVH, EF 55-60%, mild to moderate AS (mean 15 mm Hg), mild AI, mild LAE  . History of prostate cancer     Radical prostatectomy in 2006 Dr. Terance [name]   . History of infection of prosthetic left knee joint     Treated with debridement and irrigation followed by 6 months of triple antibiotics   . Obesity (BMI 30-39.9)   . Incontinence of urine     Multiple urologic procedures   . Hypertensive heart disease   . Aortic stenosis     Mild-mod by echo 06/2013  . 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)     Initial DVT in 2006 after prostate surgery and had Greenfield filter placed Bilateral  PE 2013 and placed back on warfarin DVT of right subclavian vein on doppler 10/2012 at time of knee infection   . CAD (coronary artery disease), native coronary artery     Cath 2011 [facility] (08/2009):~ Proximal LAD 30%, mid to distal LAD 25%, ostial small D1 75% mid AV groove circumflex 99% been subtotal stenosis, proximal to mid RCA 25-30%, mid RCA 30%, mid PDA 30%, EF 50% with inferior hypokinesis.;    July 2011  PCI and DES to circumflex [name]   ETT-Myoview (06/2013):  Inferolateral scar, mild peri-infarct ischemia, EF 40%; Intermediate Risk   ECHO EF 55% 03/2013    . Atrial fibrillation     Dx 2016, placed on amiodarone  . Hypertension   . History of DVT (deep vein thrombosis)     DVT in setting of prostatectomy in 2006, DVT of R [place] vein 10/2012 in the setting of knee infection  . History of pulmonary embolism     bilateral PE in 2013 s/p Greenfield filter  . CKD (chronic kidney disease), stage III   . OSA (obstructive sleep apnea)      Hospital Course: [name] is an 81 yo M pt of Dr. Wynonia [name] w a h/o CAD s/p DES to LCx 2011, mild-moderate AS per TTE (06/2013), Afib (diagnosed 2 weeks prior, recently started on Amiodarone), HTN, HLD, DVT/PE (DVT in setting of prostatectomy in 2006; bilateral PE in 2013 s/p Greenfield filter; DVT of R [place] vein 10/2012 in the setting of knee infection), CKD (prior baseline Cr 1.3-1.4), OSA on CPAP, prostate cancer s/p radial prostatectomy (2006), & urinary incontinence presented to [facility] 09/18/2014 at 16:30 with 7 hours of intermittent 2/10, mild, non-radiating chest pressure that started at 9:30 while watching television. The pain was noted to last seconds at a time & worsen with deep breaths. He denied association with activity, oral intake, or position.The last time the pain occurred was at 19:00 while in the ED.Prior to admission, the last time he  had experienced chest pain was during the time of his stent placement in 2011.He was not given any medications in our ED.   Of note, the patient has recently undergone extensive workup by both his pulmonologist Dr. Camillo [name] & his cardiologist Dr. Wynonia [name] for several months of dyspnea.We did not have access to those records but the patient reported that Dr. Camillo [name] ordered PFT's & started CPAP as well as Symbicort (for ? Asthma) as well as furosemide a couple of months prior.He believes that the CPAP has significantly helped.Additionally, Dr. Wynonia [name] ordered an echocardiogram 3 weeks prior (results unknown to the pt) as well as a VQ scan (09/15/14 at [facility], negative for  PE) & started him on Amiodarone 200 mg PO BID the week prior for a new diagnosis of atrial fibrillation.He denied symptoms with this.With this combination of therapies, he noted significant improvement in his dyspnea as he was previously symptomatic with just talking, now able to walk a couple of blocks at a time.With the furosemide, he has lost 7 lbs of fluid in the preceding month as well as had reduction in his bilateral lower extremity swelling.He denied orthopnea or paroxysmal nocturnal dyspnea.He has a mild, intermittent, nonproductive, nocturnal cough since undergoing intubation for his prostatectomy. This has not changed recently. He occasionally takes cough syrup for this. He initially reported his INR was followed by PCP but then recalled this AM that it is followed by Dr. Wynonia [name].  He was admitted for further evaluation of chest pain. There was no overt ischemia evident on EKG.Risk of PE was felt to be low given his therapeutic INR & recently negative VQ scan.HR was controlled. BP was elevated which was felt possibly contributing to his symptoms. This was managed with med adjustment with addition of felodipine. Gentle diuresis was initiated for possible mild volume overload. He ruled out for MI without further episodes of CP. BP improved today to 135/74. EKG does not show any evidence of ischemia. He has follow-up with Dr. Wynonia [name] on Monday. At that point, he may desire to proceed with nuclear stress test - we will defer to Dr. Wynonia [name]. I asked him to discuss timing of next INR check with Dr. Wynonia [name]. [name] has seen and examined the patient today and feels he is stable for discharge.  Discharge Vitals: Blood pressure 135/74, pulse 55, temperature 97.8 F (36.6 C), temperature source Oral, resp. rate 18, height 6' (1.829 m), weight 249 lb 1.6 oz (112.991 kg), SpO2 93 %.  Labs: Lab Results  Component Value Date   WBC 5.6 09/19/2014   HGB 15.3 09/19/2014   HCT 46.1 09/19/2014   MCV 80.7  09/19/2014   PLT 168 09/19/2014    Recent Labs Lab 09/19/14 0646  NA 138  K 4.1  CL 106  CO2 26  BUN 26*  CREATININE 1.80*  CALCIUM 9.6  PROT 6.3*  BILITOT 1.4*  ALKPHOS 34*  ALT 10*  AST 24  GLUCOSE 104*    Recent Labs  09/19/14 0646  TROPONINI 0.03   Lab Results  Component Value Date   CHOL 118 04/28/2011   HDL 34.90* 04/28/2011   LDLCALC 66 04/28/2011   TRIG 85.0 04/28/2011   No results found for: DDIMER  Diagnostic Studies/Procedures   Dg Chest 2 View  09/18/2014   CLINICAL DATA:  Chest pain and shortness of breath today, coronary artery disease, atrial fibrillation, former smoker  EXAM: CHEST  2 VIEW  COMPARISON:  09/15/2014  FINDINGS: Enlargement of cardiac silhouette.  Atherosclerotic  calcification and minimal elongation of thoracic aorta.  Mediastinal contours and pulmonary vascularity otherwise normal.  Emphysematous and bronchitic changes compatible with COPD.  Chronic accentuation of interstitial markings in mid to lower lung stable.  No acute infiltrate, pleural effusion or pneumothorax.  Bones demineralized.  Surgical clips LEFT axilla.  IMPRESSION: Mild enlargement of cardiac silhouette.  COPD changes with chronic interstitial prominence.  No acute abnormalities.   Electronically Signed   By: Lavonia Dana M.D.   On: 09/18/2014 16:53   Dg Chest 2 View  09/15/2014   CLINICAL DATA:  Short of breath.  Dyspnea.  EXAM: CHEST  2 VIEW  COMPARISON:  11/17/2013  FINDINGS: Cardiac silhouette is mildly enlarged. No mediastinal or hilar masses or evidence of adenopathy.  Lungs show irregular interstitial thickening most prominent in the bases, which has increased when compared to a chest radiograph dated 10/09/2012. No lung consolidation or edema. No pleural effusion or pneumothorax.  Stable vascular clips noted in the left axilla.  Bony thorax is demineralized but grossly intact.  IMPRESSION: 1. No acute cardiopulmonary disease. 2. Interstitial thickening, most evident in the  lung bases, which have increased when compared to a chest radiograph from 10/09/2012. Consider follow-up high-resolution chest CT to evaluate for interstitial lung disease.   Electronically Signed   By: Lajean Manes M.D.   On: 09/15/2014 14:13   Nm Pulmonary Perf And Vent  09/15/2014   CLINICAL DATA:  Dyspnea.  History of pulmonary emboli.  EXAM: NUCLEAR MEDICINE VENTILATION - PERFUSION LUNG SCAN  TECHNIQUE: Ventilation images were obtained in multiple projections using inhaled aerosol Tc-34m DTPA. Perfusion images were obtained in multiple projections after intravenous injection of Tc-41m MAA.  RADIOPHARMACEUTICALS:  37.2 mCi of Technetium-35m DTPA aerosol inhalation and 5.9 mCi of Technetium-27m MAA IV  COMPARISON:  Chest x-ray dated 09/15/2014  FINDINGS: Ventilation: No focal ventilation defect.  Perfusion: No wedge shaped peripheral perfusion defects to suggest acute pulmonary embolism.  IMPRESSION: Normal ventilation-perfusion lung scan. No evidence of pulmonary embolism.   Electronically Signed   By: Lorriane Shire M.D.   On: 09/15/2014 15:30    Discharge Medications   Current Discharge Medication List    START taking these medications   Details  felodipine (PLENDIL) 2.5 MG 24 hr tablet Take 1 tablet (2.5 mg total) by mouth daily. Qty: 30 tablet, Refills: 1      CONTINUE these medications which have CHANGED   Details  warfarin (COUMADIN) 5 MG tablet Take 0.5 tablets (2.5 mg total) by mouth daily. Dose has not changed. The prior dose listed in Epic was wrong, and we wanted to make sure that the patient continued taking the prior dose he was on.      CONTINUE these medications which have NOT CHANGED   Details  albuterol (PROVENTIL) (2.5 MG/3ML) 0.083% nebulizer solution Take 2.5 mg by nebulization 2 (two) times daily.    amiodarone (PACERONE) 200 MG tablet Take 200 mg by mouth 2 (two) times daily.     budesonide-formoterol (SYMBICORT) 160-4.5 MCG/ACT inhaler Inhale 2 puffs into the  lungs 2 (two) times daily.    cholecalciferol (VITAMIN D) 1000 UNITS tablet Take 1,000 Units by mouth daily.    Cyanocobalamin (B-12) 1000 MCG CAPS Take 1 tablet by mouth daily.     Diphenhydramine-APAP, sleep, (EXCEDRIN PM PO) Take 1 tablet by mouth at bedtime.     esomeprazole (NEXIUM) 40 MG capsule Take 40 mg by mouth daily before breakfast.     fenofibrate 160 MG tablet  Take 1 tablet (160 mg total) by mouth daily.     furosemide (LASIX) 20 MG tablet Take 20 mg by mouth daily.    Misc Natural Products (GLUCOSAMINE CHONDROITIN ADV PO) Take 1 tablet by mouth daily.     Multiple Vitamin (MULTIVITAMIN) capsule Take 1 capsule by mouth daily.     nitroGLYCERIN (NITROSTAT) 0.4 MG SL tablet Place 1 tablet (0.4 mg total) under the tongue every 5 (five) minutes as needed.     Omega-3 Fatty Acids (FISH OIL CONCENTRATE PO) Take 2 capsules by mouth daily.     pravastatin (PRAVACHOL) 20 MG tablet TAKE 1 TABLET BY MOUTH EVERY DAY         Disposition   The patient will be discharged in stable condition to home. Discharge Instructions    Diet - low sodium heart healthy    Complete by:  As directed      Increase activity slowly    Complete by:  As directed   The only medicine change is that felodipine was added for your blood pressure.          Follow-up Information    Follow up with Ezzard Standing, MD.   Specialty:  Cardiology   Why:  Keep followup as planned on Monday 09/21/14.   Contact information:   [address] [place] [address] [phone]       Follow up with Coumadin Checks.   Why:  Contiue previous Coumadin dose and keep your appointment for regular Coumadin level checks.        Duration of Discharge Encounter: Greater than 30 minutes including physician and PA time.  SignedMelina Copa PA-C 09/19/2014, 20:47 AM  81 year old male patient of Dr. Thurman Coyer with coronary artery disease status post DES to left circumflex, mild to moderate  aortic stenosis, recently diagnosed atrial fibrillation placed on amiodarone 2 weeks ago, hypertension, hyperlipidemia, history of DVT/PE in 2006, bilateral PE in 2013 with Greenfield filter, DVT in 2014 in the setting of knee infection, chronic kidney disease with baseline creatinine 1.3-1.4, OSA with observation for atypical chest pain.  1. Atypical chest pain - Troponins are normal, reassuring. EKG does not show any evidence of ischemia. - He has follow-up with Dr. Wynonia [name] on Monday. At that point, he may desire to proceed with nuclear stress test. - Patient and wife are agreeable to plan.  2. Atrial fibrillation - Slow ventricular response noted on telemetry - New start amiodarone 2 weeks ago - He is on no other AV nodal blocking agents. - He did not experience any syncope, dizziness. Continue with clinical monitoring for now. In the future, he may require pacemaker. Discussed with he and wife.  3. Aortic stenosis - At least mild to moderate. Most recent echocardiogram at Dr. Thurman Coyer office, discussion pending  4. Dyspnea - Extensive workup performed, see history and physical. - Continue with furosemide 20 mg once a day  5. Chronic kidney disease stage III - Close monitoring of creatinine especially in light of diuresis.  6. Chronic anticoagulation - Continue warfarin  Okay with discharge home. Has close follow-up Monday with Dr. Wynonia [name].  Sheetal Lyall

## 2014-09-19 NOTE — Progress Notes (Signed)
RT brought CPAP machine to patient's room and set it up. The patient was not ready to wear it but stated that he would put it on when he went to bed. Patient tried on the mask and RT adjusted it for patient. CPAP setting is on 8cm H20. RT put sterile water in the humidifier.  RT educated patient on how to turn on the CPAP and had patient demonstrate how to use it. RT informed patient to call if he needed assistance. RT will continue to monitor.

## 2014-09-19 NOTE — Care Management Note (Signed)
Case Management Note  Patient Details  Name: [name] MRN: [id number] Date of Birth: 12/25/1931  Subjective/Objective:    Chest pain                Action/Plan: Cm spoke to patient and wife at the bedside. Pt awaiting lab results and then likely to be discharged home with spouse. Pt and wife deny any needs for discharge and pt is independent at home prior to admission. Pt states that he has a walker at home from a previous knee surgery but does not need to use it to ambulate at this time. Pt denies further discharge needs. No additional CM needs communicated.  Expected Discharge Date:  09/19/14              Expected Discharge Plan:  Home/Self Care  In-House Referral:  CM  Discharge planning Services     Post Acute Care Choice:    Choice offered to:     DME Arranged:    DME Agency:     HH Arranged:    HH Agency:     Status of Service:  Completed, signed off.   Medicare Important Message Given:    Date Medicare IM Given:    Medicare IM give by:    Date Additional Medicare IM Given:    Additional Medicare Important Message give by:     If discussed at [place] of Stay Meetings, dates discussed:    Additional Comments:  Guido Sander, RN 09/19/2014, 11:06 AM

## 2014-09-19 NOTE — H&P (Signed)
Patient ID: [name] MRN: [id number], DOB/AGE: [birth year]   Admit date: 09/18/2014  Primary Physician: Shirline Frees, MD  Primary Cardiologist:  Dr. Wynonia Lawman  CC:  CP  HPI:  81 yo M pt of Dr. Wynonia Lawman w a h/o CAD s/p DES to LCx, mild-moderate AS per TTE (06/2013), Afib (diagnoed 2 weeks prior, recently started on Amiodarone), HTN, HLD, DVT/PE (DVT in setting of prostatectomy in 2006; bilateral PE in 2013 s/p Greenfield filter; DVT of R [place] vein 10/2012 in the setting of knee infection), CKD (prior baseline Cr 1.3-1.4), OSA on CPAP; prostate cancer s/p radial prostatectomy (2006), & urinary incontinence presented today at 16:30 with 7 hours of intermittent 2/10, mild, non-radiating chest pressure that started at 9:30 while watching television.  The pain was noted to last seconds at a time & worsen with deep breaths.  He denied association with activity, oral intake, or position.  The last time the pain occurred was at 19:00 while in the ED.  Prior to today, the last time he had experienced chest pain was during the time of his stent placement in 2011.  He was not given any medications in our ED.    Of note, the patient has recently undergone extensive workup by both his pulmonologist Dr. Camillo Flaming & his cardiologist Dr. Wynonia Lawman for several months of dyspnea.  Unfortunately, I do not have access to records for this, but the patient reported that Dr. Camillo Flaming ordered PFT's & started CPAP as well as Symbicort (for ? Asthma) as well as Furosemide a couple of months prior.  He believes that the CPAP has significantly helped.  Additionally, Dr. Wynonia Lawman ordered an echocardiogram 3 weeks prior (results unknown to the pt) as well as a VQ scan (09/15/14 at [facility], negative for PE) & started him on Amiodarone 200 mg PO BID the week prior for a new diagnosis of atrial fibrillation.  He denied symptoms with this.    With this combination of therapies, he noted significant improvement in his dyspnea as he was  previously symptomatic with just talking, now able to walk a couple of blocks at a time.  With the Furosemide, he has lost 7 lbs of fluid in the preceding month as well as had reduction in his bilateral lower extremity swelling.  He denied orthopnea or paroxysmal nocturnal dyspnea.  He has a mild, intermittent, nonproductive, nocturnal cough since undergoing intubation for his prostatectomy.  This has not changed recently.  He occasionally takes cough syrup for this.    Regarding his anticoagulation, he believes his INR & dosing has been stable for a number of years, monitored by his PCP Dr. Kenton Kingfisher.  Finally, the patient is scheduled to see Dr. Wynonia Lawman in 2 days.  Relevant Data - Labs (tonight):  Troponin 0.02 --> 0.01, BNP 1066.2 - CXR (tonight):  Borderline pulmonary edema - EKG (tonight, not yet scanned but in chart):  Afib with PVC's, nonspecific ST-T abnormalities - Myoview (06/24/13):  EF 40% with inferolateral scar & mild peri-infarct ischemia  - TTE (06/24/13):  EF 55-60%, LV mildly dilated, severe concentric hypertrophy, RV normal size & function, LA mildly dilated, mild-moderate AS (VIT ratio 0.25, AVA 1.17, mean gradient 15), mild AI - Holter (06/23/13):  Bradycardia with sleep - Cardiac catheterization (08/2009):  pLAD 30%, m/dLAD 75%, ostial small D1 75%, mid AV groove circumflex 99%, pRCA to mRCA 25-30%, mPDA 30%, EF 50% with inferior hypokinesis --> DES to LCx  Problem List  Past Medical History  Diagnosis  Date  . Mixed hyperlipidemia   . GERD   . Urinary incontinence     MULTIPLE BLADDER SURGERIES - STATES NO URINARY SPHINCTER - PT'S UROLOGIST IS AT [facility]- DR. PETERSON  ( LAST VISIT WAS 09/15/11 )  . Aortic stenosis     a. Echo (06/2013): Severe LVH, EF 55-60%, mild to moderate AS (mean 15 mm Hg), mild AI, mild LAE  . History of prostate cancer     Radical prostatectomy in 2006 Dr. Terance Hart   . History of infection of prosthetic left knee joint     Treated with debridement and  irrigation followed by 6 months of triple antibiotics   . Obesity (BMI 30-39.9)   . Incontinence of urine     Multiple urologic procedures   . Hypertensive heart disease   . Aortic stenosis     ECHO 5/15    . 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)     Initial DVT in 2006 after prostate surgery and had Greenfield filter placed Bilateral  PE 2013 and placed back on warfarin DVT of right subclavian vein on doppler 10/2012 at time of knee infection   . CAD (coronary artery disease), native coronary artery     Cath 2011 [facility] (08/2009):~ Proximal LAD 30%, mid to distal LAD 25%, ostial small D1 75% mid AV groove circumflex 99% been subtotal stenosis, proximal to mid RCA 25-30%, mid RCA 30%, mid PDA 30%, EF 50% with inferior hypokinesis.;    July 2011  PCI and DES to circumflex Dr. Olevia Perches   ETT-Myoview (06/2013):  Inferolateral scar, mild peri-infarct ischemia, EF 40%; Intermediate Risk   ECHO EF 55% 03/2013   . Atrial fibrillation     Past Surgical History  Procedure Laterality Date  . Prostatectomy  04/25/2004  . Replacement total knee  bilateral  . Hernia repair    . Vena cava filter placement    . Cardiac catheterization  2011    DES-mid LCx 08/2009; 30% pLAD, 25% m/dLAD, 75% ostial D1, 99% mLCx s/p DES, 25-30% p/mRCA, 40% mRCA, 30% mPDA stenoses; LVEF 50%, inf hypokinesis  . Coronary angioplasty  08/25/09    STENT PLACEMENT  . Knee arthrotomy Left 10/09/2012    Procedure: LEFT KNEE ARTHROTOMY;  Surgeon: Gearlean Alf, MD;  Location: [facility];  Service: Orthopedics;  Laterality: Left;  . I&d knee with poly exchange Left 10/09/2012    Procedure: IRRIGATION AND DEBRIDEMENT LEFT KNEE WITH POLY REVISION;  Surgeon: Gearlean Alf, MD;  Location: [facility];  Service: Orthopedics;  Laterality: Left;  . Hemorrhoid surgery    . Uretheral implants      multiple for incontinence  . Hernia repair      Allergies  Allergies  Allergen Reactions  . Darifenacin Hydrobromide Er Nausea  Only    Dizziness   Home Medications  Prior to Admission medications   Medication Sig Start Date End Date Taking? Authorizing Provider  albuterol (PROVENTIL) (2.5 MG/3ML) 0.083% nebulizer solution Take 2.5 mg by nebulization 2 (two) times daily.   Yes Historical Provider, MD  amiodarone (PACERONE) 200 MG tablet Take 200 mg by mouth 2 (two) times daily.    Yes Historical Provider, MD  budesonide-formoterol (SYMBICORT) 160-4.5 MCG/ACT inhaler Inhale 2 puffs into the lungs 2 (two) times daily.   Yes Historical Provider, MD  cholecalciferol (VITAMIN D) 1000 UNITS tablet Take 1,000 Units by mouth daily.   Yes Historical Provider, MD  Cyanocobalamin (B-12) 1000 MCG CAPS Take 1  tablet by mouth daily.    Yes Historical Provider, MD  Diphenhydramine-APAP, sleep, (EXCEDRIN PM PO) Take 1 tablet by mouth at bedtime.    Yes Historical Provider, MD  esomeprazole (NEXIUM) 40 MG capsule Take 40 mg by mouth daily before breakfast.    Yes Historical Provider, MD  fenofibrate 160 MG tablet Take 1 tablet (160 mg total) by mouth daily. Patient taking differently: Take 160 mg by mouth at bedtime.  12/25/13  Yes Minus Breeding, MD  furosemide (LASIX) 20 MG tablet Take 20 mg by mouth daily.   Yes Historical Provider, MD  Misc Natural Products (GLUCOSAMINE CHONDROITIN ADV PO) Take 1 tablet by mouth daily.    Yes Historical Provider, MD  Multiple Vitamin (MULTIVITAMIN) capsule Take 1 capsule by mouth daily.    Yes Historical Provider, MD  nitroGLYCERIN (NITROSTAT) 0.4 MG SL tablet Place 1 tablet (0.4 mg total) under the tongue every 5 (five) minutes as needed. 08/27/13  Yes Minus Breeding, MD  Omega-3 Fatty Acids (FISH OIL CONCENTRATE PO) Take 2 capsules by mouth daily.    Yes Historical Provider, MD  pravastatin (PRAVACHOL) 20 MG tablet TAKE 1 TABLET BY MOUTH EVERY DAY Patient taking differently: TAKE 1 TABLET BY MOUTH EVERY evening 12/24/13  Yes Minus Breeding, MD  warfarin (COUMADIN) 5 MG tablet 1/2 tablet everyday  except 1 tablet on Mondays and Fridays or as directed by coumadin clinic Patient taking differently: Take 2.5 mg by mouth daily at 6 PM.  06/09/13  Yes Minus Breeding, MD   Family History  No family history on file.  Social History  Social History   Social History  . Marital Status: Married    Spouse Name: N/A  . Number of Children: 3  . Years of Education: N/A   Occupational History  . Retired Therapist, nutritional    Social History Main Topics  . Smoking status: Former Smoker    Quit date: 04/27/1961  . Smokeless tobacco: Never Used  . Alcohol Use: No  . Drug Use: No  . Sexual Activity: Not on file   Other Topics Concern  . Not on file   Social History Narrative    Review of Systems General:  No chills, fever, night sweats or weight changes.  Cardiovascular:  Positive for chest pain, dyspnea on exertion, & lower extremity swelling.  No orthopnea, palpitations, paroxysmal nocturnal dyspnea. Dermatological: No rash, lesions/masses Respiratory:  Positive for dyspnea.   Urologic: No hematuria, dysuria Abdominal:   No nausea, vomiting, diarrhea, bright red blood per rectum, melena, or hematemesis Neurologic:  No visual changes, wkns, changes in mental status. All other systems reviewed and are otherwise negative except as noted above.  Physical Exam  Blood pressure 170/90, pulse 61, temperature 98.2 F (36.8 C), temperature source Oral, resp. rate 18, height 6' (1.829 m), weight 249 lb 1.6 oz (112.991 kg), SpO2 97 %.  General: Pleasant, NAD Psych: Normal affect. Neuro: Alert and oriented X 3. Moves all extremities spontaneously. HEENT: Normal  Neck: JVD to 2 cm above clavicle while sitting up. Lungs:  Resp regular and unlabored, CTA. Heart: Irregularly irregular, 3/6 SEM Abdomen: Soft, non-tender, non-distended, BS + x 4.  Extremities: +2 PE b/l LE. DP/PT/Radials 2+ and equal bilaterally.  Labs  Troponin [facility] Test)  Recent Labs  09/18/14 2013  TROPIPOC  0.01   No results for input(s): CKTOTAL, CKMB, TROPONINI in the last 72 hours. Lab Results  Component Value Date   WBC 4.9 09/18/2014   HGB 14.9  09/18/2014   HCT 45.3 09/18/2014   MCV 80.6 09/18/2014   PLT 173 09/18/2014    Recent Labs Lab 09/18/14 1628  NA 142  K 4.6  CL 110  CO2 25  BUN 32*  CREATININE 2.00*  CALCIUM 10.1  GLUCOSE 132*   Lab Results  Component Value Date   CHOL 118 04/28/2011   HDL 34.90* 04/28/2011   LDLCALC 66 04/28/2011   TRIG 85.0 04/28/2011   No results found for: DDIMER   Radiology/Studies  Dg Chest 2 View  09/18/2014   CLINICAL DATA:  Chest pain and shortness of breath today, coronary artery disease, atrial fibrillation, former smoker  EXAM: CHEST  2 VIEW  COMPARISON:  09/15/2014  FINDINGS: Enlargement of cardiac silhouette.  Atherosclerotic calcification and minimal elongation of thoracic aorta.  Mediastinal contours and pulmonary vascularity otherwise normal.  Emphysematous and bronchitic changes compatible with COPD.  Chronic accentuation of interstitial markings in mid to lower lung stable.  No acute infiltrate, pleural effusion or pneumothorax.  Bones demineralized.  Surgical clips LEFT axilla.  IMPRESSION: Mild enlargement of cardiac silhouette.  COPD changes with chronic interstitial prominence.  No acute abnormalities.   Electronically Signed   By: Lavonia Dana M.D.   On: 09/18/2014 16:53   Dg Chest 2 View  09/15/2014   CLINICAL DATA:  Short of breath.  Dyspnea.  EXAM: CHEST  2 VIEW  COMPARISON:  11/17/2013  FINDINGS: Cardiac silhouette is mildly enlarged. No mediastinal or hilar masses or evidence of adenopathy.  Lungs show irregular interstitial thickening most prominent in the bases, which has increased when compared to a chest radiograph dated 10/09/2012. No lung consolidation or edema. No pleural effusion or pneumothorax.  Stable vascular clips noted in the left axilla.  Bony thorax is demineralized but grossly intact.  IMPRESSION: 1. No  acute cardiopulmonary disease. 2. Interstitial thickening, most evident in the lung bases, which have increased when compared to a chest radiograph from 10/09/2012. Consider follow-up high-resolution chest CT to evaluate for interstitial lung disease.   Electronically Signed   By: Lajean Manes M.D.   On: 09/15/2014 14:13   Nm Pulmonary Perf And Vent  09/15/2014   CLINICAL DATA:  Dyspnea.  History of pulmonary emboli.  EXAM: NUCLEAR MEDICINE VENTILATION - PERFUSION LUNG SCAN  TECHNIQUE: Ventilation images were obtained in multiple projections using inhaled aerosol Tc-57m DTPA. Perfusion images were obtained in multiple projections after intravenous injection of Tc-48m MAA.  RADIOPHARMACEUTICALS:  37.2 mCi of Technetium-4m DTPA aerosol inhalation and 5.9 mCi of Technetium-22m MAA IV  COMPARISON:  Chest x-ray dated 09/15/2014  FINDINGS: Ventilation: No focal ventilation defect.  Perfusion: No wedge shaped peripheral perfusion defects to suggest acute pulmonary embolism.  IMPRESSION: Normal ventilation-perfusion lung scan. No evidence of pulmonary embolism.   Electronically Signed   By: Lorriane Shire M.D.   On: 09/15/2014 15:30   A/P:  81 yo M pt of Dr. Wynonia Lawman w a h/o CAD s/p DES to LCx, mild-moderate AS per TTE (06/2013), Afib (diagnoed 2 weeks prior, recently started on Amiodarone), HTN, HLD, DVT/PE (DVT in setting of prostatectomy in 2006; bilateral PE in 2013 s/p Greenfield filter; DVT of R [place] vein 10/2012 in the setting of knee infection), CKD (prior baseline Cr 1.3-1.4), OSA on CPAP; prostate cancer s/p radial prostatectomy (2006), & urinary incontinence p/w atypical CP.  # Atypical chest pain in the setting of CAD - No overt ischemia evident on EKG.  Unsure of the recent status of his echocardiogram per Dr.  Wynonia Lawman, regarding his AS.  Troponin negative x 2.  Risk of PE is low given his therapeutic INR & recently negative VQ scan.  BP notably high this evening, which may be contributing.  Additionally, he  appears mildly volume overloaded.   - Will initiate ASA while being ruled out. - Dependent on whether Dr. Thurman Coyer echocardiogram results are available, could consider repeating this.   - If enzymes are negative overnight, he can likely be discharged with the plan for an outpatient stress.   - Will attempt gentle diuresis with cognizance of his renal function.   - Will attempt to improve blood pressure control with Felodipine rather than Amlodipine, which will take longer to be effective.    # Afib - CHADS2-VASc = 4.  INR is therapeutic.  He is rate controlled.   - Continue his oral Amiodarone load started by Dr. Wynonia Lawman started the week prior.   - No AVN blocking agents with bradycardia.  # AKI on CKD - Though this may be his more recent baseline. - Will check a UA & urine electrolytes. - Monitor strict I/O & avoid nephrotoxic agents.    # h/o HTN - BP elevated.  Will attempt Felodipine as per above.    # h/o HLD - Continue home Fenofibrate & Pravastatin.  # h/o Recurrent VTE - Reassured by recent VQ.  Continue home Warfarin.    # h/o (?) Asthma - No hypoxia on presentation.  Continue home Symbicort & Albuterol.   Signed, Alfonso Ramus, MD

## 2014-09-20 LAB — UREA NITROGEN, URINE: Urea Nitrogen, Ur: 250 mg/dL

## 2014-09-21 ENCOUNTER — Ambulatory Visit (HOSPITAL_COMMUNITY): Payer: Medicare Other

## 2014-09-21 ENCOUNTER — Encounter (HOSPITAL_COMMUNITY): Payer: Federal, State, Local not specified - PPO

## 2014-09-21 DIAGNOSIS — I480 Paroxysmal atrial fibrillation: Secondary | ICD-10-CM | POA: Diagnosis not present

## 2014-09-21 DIAGNOSIS — Z8546 Personal history of malignant neoplasm of prostate: Secondary | ICD-10-CM | POA: Diagnosis not present

## 2014-09-21 DIAGNOSIS — E668 Other obesity: Secondary | ICD-10-CM | POA: Diagnosis not present

## 2014-09-21 DIAGNOSIS — I350 Nonrheumatic aortic (valve) stenosis: Secondary | ICD-10-CM | POA: Diagnosis not present

## 2014-09-21 DIAGNOSIS — I119 Hypertensive heart disease without heart failure: Secondary | ICD-10-CM | POA: Diagnosis not present

## 2014-09-21 DIAGNOSIS — K219 Gastro-esophageal reflux disease without esophagitis: Secondary | ICD-10-CM | POA: Diagnosis not present

## 2014-09-21 DIAGNOSIS — Z955 Presence of coronary angioplasty implant and graft: Secondary | ICD-10-CM | POA: Diagnosis not present

## 2014-09-21 DIAGNOSIS — I2510 Atherosclerotic heart disease of native coronary artery without angina pectoris: Secondary | ICD-10-CM | POA: Diagnosis not present

## 2014-09-21 DIAGNOSIS — I481 Persistent atrial fibrillation: Secondary | ICD-10-CM | POA: Diagnosis not present

## 2014-09-21 DIAGNOSIS — R0602 Shortness of breath: Secondary | ICD-10-CM | POA: Diagnosis not present

## 2014-09-21 DIAGNOSIS — E784 Other hyperlipidemia: Secondary | ICD-10-CM | POA: Diagnosis not present

## 2014-09-21 DIAGNOSIS — Z86718 Personal history of other venous thrombosis and embolism: Secondary | ICD-10-CM | POA: Diagnosis not present

## 2014-09-21 DIAGNOSIS — Z7901 Long term (current) use of anticoagulants: Secondary | ICD-10-CM | POA: Diagnosis not present

## 2014-09-22 ENCOUNTER — Telehealth: Payer: Self-pay | Admitting: Cardiovascular Disease

## 2014-09-22 NOTE — Telephone Encounter (Signed)
Needs D/C phone call .

## 2014-09-22 NOTE — Telephone Encounter (Signed)
Patient contacted regarding discharge from [facility] on 09/21/2014.  Patient understands to follow up with provider Dr. Wynonia Lawman on 09/24/2014  at his office. Patient understands discharge instructions? yes Patient understands medications and regiment? yes Patient understands to bring all medications to this visit? yes

## 2014-09-24 DIAGNOSIS — E784 Other hyperlipidemia: Secondary | ICD-10-CM | POA: Diagnosis not present

## 2014-09-24 DIAGNOSIS — I481 Persistent atrial fibrillation: Secondary | ICD-10-CM | POA: Diagnosis not present

## 2014-09-24 DIAGNOSIS — Z8546 Personal history of malignant neoplasm of prostate: Secondary | ICD-10-CM | POA: Diagnosis not present

## 2014-09-24 DIAGNOSIS — R0602 Shortness of breath: Secondary | ICD-10-CM | POA: Diagnosis not present

## 2014-09-24 DIAGNOSIS — E668 Other obesity: Secondary | ICD-10-CM | POA: Diagnosis not present

## 2014-09-24 DIAGNOSIS — I350 Nonrheumatic aortic (valve) stenosis: Secondary | ICD-10-CM | POA: Diagnosis not present

## 2014-09-24 DIAGNOSIS — I119 Hypertensive heart disease without heart failure: Secondary | ICD-10-CM | POA: Diagnosis not present

## 2014-09-24 DIAGNOSIS — Z86718 Personal history of other venous thrombosis and embolism: Secondary | ICD-10-CM | POA: Diagnosis not present

## 2014-09-24 DIAGNOSIS — K219 Gastro-esophageal reflux disease without esophagitis: Secondary | ICD-10-CM | POA: Diagnosis not present

## 2014-09-24 DIAGNOSIS — I2510 Atherosclerotic heart disease of native coronary artery without angina pectoris: Secondary | ICD-10-CM | POA: Diagnosis not present

## 2014-09-24 DIAGNOSIS — Z7901 Long term (current) use of anticoagulants: Secondary | ICD-10-CM | POA: Diagnosis not present

## 2014-09-24 DIAGNOSIS — Z955 Presence of coronary angioplasty implant and graft: Secondary | ICD-10-CM | POA: Diagnosis not present

## 2014-10-05 DIAGNOSIS — Z955 Presence of coronary angioplasty implant and graft: Secondary | ICD-10-CM | POA: Diagnosis not present

## 2014-10-05 DIAGNOSIS — Z8546 Personal history of malignant neoplasm of prostate: Secondary | ICD-10-CM | POA: Diagnosis not present

## 2014-10-05 DIAGNOSIS — E784 Other hyperlipidemia: Secondary | ICD-10-CM | POA: Diagnosis not present

## 2014-10-05 DIAGNOSIS — E668 Other obesity: Secondary | ICD-10-CM | POA: Diagnosis not present

## 2014-10-05 DIAGNOSIS — I119 Hypertensive heart disease without heart failure: Secondary | ICD-10-CM | POA: Diagnosis not present

## 2014-10-05 DIAGNOSIS — I350 Nonrheumatic aortic (valve) stenosis: Secondary | ICD-10-CM | POA: Diagnosis not present

## 2014-10-05 DIAGNOSIS — Z7901 Long term (current) use of anticoagulants: Secondary | ICD-10-CM | POA: Diagnosis not present

## 2014-10-05 DIAGNOSIS — Z86718 Personal history of other venous thrombosis and embolism: Secondary | ICD-10-CM | POA: Diagnosis not present

## 2014-10-05 DIAGNOSIS — I2510 Atherosclerotic heart disease of native coronary artery without angina pectoris: Secondary | ICD-10-CM | POA: Diagnosis not present

## 2014-10-05 DIAGNOSIS — I480 Paroxysmal atrial fibrillation: Secondary | ICD-10-CM | POA: Diagnosis not present

## 2014-10-05 DIAGNOSIS — Z01810 Encounter for preprocedural cardiovascular examination: Secondary | ICD-10-CM | POA: Diagnosis not present

## 2014-10-05 DIAGNOSIS — R0602 Shortness of breath: Secondary | ICD-10-CM | POA: Diagnosis not present

## 2014-10-05 DIAGNOSIS — I481 Persistent atrial fibrillation: Secondary | ICD-10-CM | POA: Diagnosis not present

## 2014-10-05 NOTE — H&P (Signed)
[place], [place]    Date of visit:  10/05/2014 DOB:  1931-12-07    Age:  81 yrs. Medical record number:  [id number]     Account number:  [id number] Primary Care Provider: Ernestine Conrad ____________________________ CURRENT DIAGNOSES  1. Dyspnea  2. Encounter for preprocedural cardiovascular examination  3. Long term (current) use of anticoagulants  4. Hyperlipidemia  5. Hypertensive heart disease without heart failure  6. CAD Native without angina  7. Aortic valve stenosis  8. Persistent atrial fibrillation  9. Obesity  10. Presence of coronary stent  11. Gastro-esophageal reflux disease  12. Personal History Of Malignant Neoplasm Of Prostate  13. Personal history of other venous thrombosis and embolism  14. Paroxysmal atrial fibrillation ____________________________ ALLERGIES  No Known Allergies ____________________________ MEDICATIONS  1. Fish Oil 300 mg-1,000 mg capsule, 2 p.o. daily  2. Glucosamine 500 mg tablet, 2000mg  qd  3. Vitamin B-12 1,000 mcg tablet, 1 p.o. daily  4. multivitamin tablet, 1 p.o. daily  5. Nexium 40 mg capsule,delayed release, 1 p.o. daily  6. docusate sodium 100 mg capsule, PRN  7. fenofibrate 160 mg tablet, 1 p.o. daily  8. nitroglycerin 0.4 mg sublingual tablet, PRN  9. warfarin 5 mg tablet, Take as directed  10. pravastatin 20 mg tablet, 1 p.o. daily  11. furosemide 40 mg tablet, 1 p.o. daily  12. amiodarone 200 mg tablet, BID  13. amlodipine 2.5 mg tablet, 1 p.o. daily  14. albuterol sulfate 2.5 mg/0.5 mL solution for nebulization, BID ____________________________ CHIEF COMPLAINTS  F/u atrial fib ____________________________ HISTORY OF PRESENT ILLNESS Patient seen for evaluation prior to cardioversion. He has a diagnosis of aortic stenosis, coronary artery disease with previous stenting and some systolic and diastolic heart failure. He also has a history of pulmonary emboli. He has had worsening dyspnea recently was treated by the  pulmonary doctor prior to being sent here. He was noted to be in atrial fibrillation and was volume overloaded. He was diuresed and later started on amiodarone. With diuresis and control of his atrial fibrillation rate he is feeling much better. He know already has edema and his dyspnea has improved. He had an admission to the hospital for one night with atypical chest pain but this improved and he recently had a negative myocardial perfusion scan. Because of the atrial fibrillation and his symptoms of worsening heart failure cardioversion was advised to help see if we can restore atrial kick. ____________________________ PAST HISTORY  Past Medical Illnesses:  hyperlipidemia, DVT, GERD, kidney stones, prostate cancer trated with surgery, hypertension, obesity, history of infection of left knee prosthesis treated with triple antibiotics for 6 months 2014-2015;  Cardiovascular Illnesses:  CAD, history of pulmonary embolus, atrial fibrillation, lateral wall MI 2011;  Surgical Procedures:  appendectomy, hemmoroidectomy, knee replacement-bil, prostatectomy 2006, multiple urethral implants for incontinence, hernia repair;  NYHA Classification:  I;  Canadian Angina Classification:  Class 0: Asymptomatic;  Cardiology Procedures-Invasive:  cardiac cath (left) July 2011, stent July 2011;  Cardiology Procedures-Noninvasive:  treadmill, echocardiogram 2015, lexiscan cardiolite 2015, echocardiogram August 2016, lexiscan Myoview August 2016;  Cardiac Cath Results:  [facility] (08/2009):  Proximal LAD 30%, mid to distal LAD 25%, ostial small D1 75% mid AV groove circumflex 99% been subtotal stenosis, proximal to mid RCA 25-30%, mid RCA 30%, mid PDA 30%, EF 50% with inferior hypokinesis.;  Peripheral Vascular Procedures:  Greenfield IVC filter 2006 for DVT following prostatectomy;  LVEF of 37% documented via nuclear study on 09/24/2014,   CHA2DS2-VASC Score:  4  ____________________________ Caleen Essex TEST DATES EKG Date:   10/05/2014;  Stent Placement Date: 08/29/2009;  Nuclear Study Date:  09/24/2014;  Echocardiography Date: 09/14/2014;   ____________________________ PHYSICAL EXAMINATION VITAL SIGNS  Blood Pressure:  124/70 Sitting, Left arm, regular cuff  , 128/70 Standing, Left arm and regular cuff   Pulse:  64/min. Weight:  249.00 lbs. Height:  72"BMI: 34  Constitutional:  pleasant white male in no acute distress, moderately obese Skin:  skin tags Head:  normocephalic, normal hair pattern, no masses or tenderness Eyes:  EOMS Intact, PERRLA, C and S clear, Funduscopic exam not done. ENT:  ears, nose and throat reveal no gross abnormalities.  Dentition good. Neck:  supple, without massess. No JVD, thyromegaly or carotid bruits. Carotid upstroke normal. Chest:  normal symmetry, clear to auscultation. Cardiac:  irregular ryhthm, normal S1 and S2, no S3 or S4, grade 2/6 systolic murmur at aortic area radiating to neck Abdomen:  abdomen soft,non-tender, no masses, no hepatospenomegaly, or aneurysm noted Peripheral Pulses:  femoral pulse(s) 2+, posterior tibial pulse(s) 2+ Extremities & Back:  well healed scar from knee surgery bilaterally, trace edema Neurological:  no gross motor or sensory deficits noted, affect appropriate, oriented x3. ____________________________ MOST RECENT LIPID PANEL 03/04/14  CHOL TOTL 148 mg/dl, LDL 80 NM, HDL 32 mg/dl, TRIGLYCER 180 mg/dl and CHOL/HDL 4.6 (Calc) ____________________________ IMPRESSIONS/PLAN  1. Persistent atrial fibrillation 2. Mild to moderate aortic stenosis 3. Coronary artery disease with previous stenting 4. Prior history of pulmonary emboli and DVT 5. Long term use of anticoagulants 6. Systolic and diastolic CHF  Recommendations:  Patient brought in for cardioversion. Cardioversion discussed with the patient including risks of stroke, arrhythmia, death, or anesthesia risks. The patient understands and is willing to proceed. He has been anticoagulated  therapeutically with INR greater than 2 for over one month.  ____________________________ TODAYS ORDERS  1. Coag Clinic Visit: Coag OV 1 month  2. 12 Lead EKG: Today  3. Comprehensive Metabolic Panel: Today  4. Complete Blood Count: Today  5. Electrical Cardioversion: 3 days                       ____________________________ Cardiology Physician:  Kerry Hough MD [facility]

## 2014-10-07 MED ORDER — SODIUM CHLORIDE 0.9 % IV SOLN
INTRAVENOUS | Status: DC
  Administered 2014-10-08: 500 mL via INTRAVENOUS

## 2014-10-08 ENCOUNTER — Ambulatory Visit (HOSPITAL_COMMUNITY): Payer: Medicare Other | Admitting: Anesthesiology

## 2014-10-08 ENCOUNTER — Encounter (HOSPITAL_COMMUNITY)
Admission: RE | Disposition: A | Discharge status: Home / Self Care | Payer: Self-pay | Source: Ambulatory Visit | Attending: Cardiology

## 2014-10-08 ENCOUNTER — Encounter (HOSPITAL_COMMUNITY): Payer: Self-pay

## 2014-10-08 ENCOUNTER — Ambulatory Visit (HOSPITAL_COMMUNITY)
Admission: RE | Admit: 2014-10-08 | Discharge: 2014-10-08 | Disposition: A | Discharge status: Home / Self Care | Payer: Medicare Other | Source: Ambulatory Visit | Attending: Cardiology | Admitting: Cardiology

## 2014-10-08 DIAGNOSIS — E785 Hyperlipidemia, unspecified: Secondary | ICD-10-CM | POA: Diagnosis not present

## 2014-10-08 DIAGNOSIS — Z86711 Personal history of pulmonary embolism: Secondary | ICD-10-CM | POA: Insufficient documentation

## 2014-10-08 DIAGNOSIS — I480 Paroxysmal atrial fibrillation: Secondary | ICD-10-CM | POA: Diagnosis not present

## 2014-10-08 DIAGNOSIS — I110 Hypertensive heart disease with heart failure: Secondary | ICD-10-CM | POA: Insufficient documentation

## 2014-10-08 DIAGNOSIS — Z87891 Personal history of nicotine dependence: Secondary | ICD-10-CM | POA: Insufficient documentation

## 2014-10-08 DIAGNOSIS — I5042 Chronic combined systolic (congestive) and diastolic (congestive) heart failure: Secondary | ICD-10-CM | POA: Insufficient documentation

## 2014-10-08 DIAGNOSIS — Z79899 Other long term (current) drug therapy: Secondary | ICD-10-CM | POA: Diagnosis not present

## 2014-10-08 DIAGNOSIS — Z955 Presence of coronary angioplasty implant and graft: Secondary | ICD-10-CM | POA: Diagnosis not present

## 2014-10-08 DIAGNOSIS — I252 Old myocardial infarction: Secondary | ICD-10-CM | POA: Insufficient documentation

## 2014-10-08 DIAGNOSIS — I4891 Unspecified atrial fibrillation: Secondary | ICD-10-CM | POA: Diagnosis not present

## 2014-10-08 DIAGNOSIS — I481 Persistent atrial fibrillation: Secondary | ICD-10-CM | POA: Diagnosis not present

## 2014-10-08 DIAGNOSIS — Z8546 Personal history of malignant neoplasm of prostate: Secondary | ICD-10-CM | POA: Insufficient documentation

## 2014-10-08 DIAGNOSIS — K219 Gastro-esophageal reflux disease without esophagitis: Secondary | ICD-10-CM | POA: Insufficient documentation

## 2014-10-08 DIAGNOSIS — Z86718 Personal history of other venous thrombosis and embolism: Secondary | ICD-10-CM | POA: Diagnosis not present

## 2014-10-08 DIAGNOSIS — Z7901 Long term (current) use of anticoagulants: Secondary | ICD-10-CM | POA: Diagnosis not present

## 2014-10-08 DIAGNOSIS — I2510 Atherosclerotic heart disease of native coronary artery without angina pectoris: Secondary | ICD-10-CM | POA: Diagnosis not present

## 2014-10-08 DIAGNOSIS — I350 Nonrheumatic aortic (valve) stenosis: Secondary | ICD-10-CM | POA: Insufficient documentation

## 2014-10-08 HISTORY — PX: CARDIOVERSION: SHX1299

## 2014-10-08 SURGERY — CARDIOVERSION
Anesthesia: Monitor Anesthesia Care

## 2014-10-08 MED ORDER — LIDOCAINE HCL (CARDIAC) 20 MG/ML IV SOLN
INTRAVENOUS | Status: DC | PRN
  Administered 2014-10-08: 30 mg via INTRAVENOUS

## 2014-10-08 MED ORDER — PROPOFOL 10 MG/ML IV BOLUS
INTRAVENOUS | Status: DC | PRN
  Administered 2014-10-08: 70 mg via INTRAVENOUS
  Administered 2014-10-08: 20 mg via INTRAVENOUS

## 2014-10-08 NOTE — Anesthesia Preprocedure Evaluation (Addendum)
Anesthesia Evaluation  Patient identified by MRN, date of birth, ID band Patient awake    Reviewed: Allergy & Precautions, NPO status , Patient's Chart, lab work & pertinent test results  Airway Mallampati: II   Neck ROM: Full    Dental  (+) Teeth Intact, Dental Advisory Given   Pulmonary asthma , former smoker (quit 1963),  breath sounds clear to auscultation        Cardiovascular hypertension, Pt. on medications DVT + dysrhythmias Atrial Fibrillation Rhythm:Regular  ECHO 2015 EF 60%   Neuro/Psych    GI/Hepatic GERD-  Medicated,  Endo/Other    Renal/GU Renal InsufficiencyRenal diseaseCreat 1.8     Musculoskeletal   Abdominal (+)  Abdomen: soft.    Peds  Hematology   Anesthesia Other Findings   Reproductive/Obstetrics                           Anesthesia Physical Anesthesia Plan  ASA: III  Anesthesia Plan: MAC   Post-op Pain Management:    Induction: Intravenous  Airway Management Planned: Nasal Cannula and Natural Airway  Additional Equipment:   Intra-op Plan:   Post-operative Plan:   Informed Consent: I have reviewed the patients History and Physical, chart, labs and discussed the procedure including the risks, benefits and alternatives for the proposed anesthesia with the patient or authorized representative who has indicated his/her understanding and acceptance.     Plan Discussed with:   Anesthesia Plan Comments:         Anesthesia Quick Evaluation

## 2014-10-08 NOTE — Transfer of Care (Signed)
Immediate Anesthesia Transfer of Care Note  Patient: [name]  Procedure(s) Performed: Procedure(s): CARDIOVERSION (N/A)  Patient Location: PACU and Endoscopy Unit  Anesthesia Type:MAC  Level of Consciousness: awake, alert , oriented and sedated  Airway & Oxygen Therapy: Patient Spontanous Breathing and Patient connected to nasal cannula oxygen  Post-op Assessment: Report given to RN, Post -op Vital signs reviewed and stable and Patient moving all extremities  Post vital signs: Reviewed and stable  Last Vitals:  Filed Vitals:   10/08/14 1220  BP: 164/90  Pulse: 58  Resp: 17    Complications: No apparent anesthesia complications

## 2014-10-08 NOTE — Anesthesia Postprocedure Evaluation (Signed)
  Anesthesia Post-op Note  Patient: [name]  Procedure(s) Performed: Procedure(s): CARDIOVERSION (N/A)  Patient Location: PACU  Anesthesia Type:MAC  Level of Consciousness: awake and alert   Airway and Oxygen Therapy: Patient Spontanous Breathing  Post-op Pain: mild  Post-op Assessment: Post-op Vital signs reviewed              Post-op Vital Signs: Reviewed and stable  Last Vitals:  Filed Vitals:   10/08/14 1355  BP:   Pulse: 66  Temp:   Resp: 23    Complications: No apparent anesthesia complications

## 2014-10-08 NOTE — CV Procedure (Signed)
Electrical Cardioversion Procedure Note  [name]   81 y.o. male MRN: [id number] DOB: [birth year]  Today's date: 10/08/2014  Procedure: Electrical Cardioversion  Indications:  Atrial Fibrillation  Time Out: Verified patient identification, verified procedure,medications/allergies/relevent history reviewed, required imaging and test results available.  Performed  Procedure Details  The patient was NPO after midnight. Anesthesia was administered at the beside  by [facility] with 30 mg of lidocaine and 90 mg of propofol.  Cardioversion was done with synchronized biphasic defibrillation with AP pads with 120 and then 200 watts.  The patient converted to normal sinus rhythm with first degree block after the 2nd shock. The patient tolerated the procedure well   IMPRESSION:  Successful cardioversion of atrial fibrillation    W. Tollie Eth, Brooke Bonito. MD [facility]   10/08/2014, 1:33 PM

## 2014-10-08 NOTE — Discharge Instructions (Signed)

## 2014-10-08 NOTE — Interval H&P Note (Signed)
History and Physical Interval Note:  10/08/2014 1:26 PM  [name]  has presented today for surgery, with the diagnosis of A FIB   The various methods of treatment have been discussed with the patient and family. After consideration of risks, benefits and other options for treatment, the patient has consented to  Procedure(s): CARDIOVERSION (N/A) as a surgical intervention .  The patient's history has been reviewed, patient examined, no change in status, stable for surgery.  I have reviewed the patient's chart and labs.  Questions were answered to the patient's satisfaction.     Ezzard Standing

## 2014-10-09 ENCOUNTER — Encounter (HOSPITAL_COMMUNITY): Payer: Self-pay | Admitting: Cardiology

## 2014-10-10 ENCOUNTER — Other Ambulatory Visit: Payer: Self-pay | Admitting: Internal Medicine

## 2014-10-10 ENCOUNTER — Telehealth: Payer: Self-pay | Admitting: Internal Medicine

## 2014-10-10 DIAGNOSIS — I4819 Other persistent atrial fibrillation: Secondary | ICD-10-CM

## 2014-10-10 MED ORDER — AMIODARONE HCL 200 MG PO TABS: 200.0000 mg | ORAL_TABLET | Freq: Two times a day (BID) | ORAL | Status: DC

## 2014-10-10 NOTE — Telephone Encounter (Signed)
Patient called because he will run out of his amiodarone prior to his follow-up visit with Dr. Wynonia Lawman.  I ordered a one-month supply and encouraged him to keep his follow-up appoint with Dr. Wynonia Lawman.  No other questions or concerns.  Rosaria Ferries, PA-C 10/10/2014

## 2014-10-15 DIAGNOSIS — G4733 Obstructive sleep apnea (adult) (pediatric): Secondary | ICD-10-CM | POA: Diagnosis not present

## 2014-10-20 DIAGNOSIS — Z86718 Personal history of other venous thrombosis and embolism: Secondary | ICD-10-CM | POA: Diagnosis not present

## 2014-10-20 DIAGNOSIS — I350 Nonrheumatic aortic (valve) stenosis: Secondary | ICD-10-CM | POA: Diagnosis not present

## 2014-10-20 DIAGNOSIS — R0602 Shortness of breath: Secondary | ICD-10-CM | POA: Diagnosis not present

## 2014-10-20 DIAGNOSIS — Z955 Presence of coronary angioplasty implant and graft: Secondary | ICD-10-CM | POA: Diagnosis not present

## 2014-10-20 DIAGNOSIS — I119 Hypertensive heart disease without heart failure: Secondary | ICD-10-CM | POA: Diagnosis not present

## 2014-10-20 DIAGNOSIS — E668 Other obesity: Secondary | ICD-10-CM | POA: Diagnosis not present

## 2014-10-20 DIAGNOSIS — Z8546 Personal history of malignant neoplasm of prostate: Secondary | ICD-10-CM | POA: Diagnosis not present

## 2014-10-20 DIAGNOSIS — N183 Chronic kidney disease, stage 3 (moderate): Secondary | ICD-10-CM | POA: Diagnosis not present

## 2014-10-20 DIAGNOSIS — I2510 Atherosclerotic heart disease of native coronary artery without angina pectoris: Secondary | ICD-10-CM | POA: Diagnosis not present

## 2014-10-20 DIAGNOSIS — E784 Other hyperlipidemia: Secondary | ICD-10-CM | POA: Diagnosis not present

## 2014-10-20 DIAGNOSIS — I480 Paroxysmal atrial fibrillation: Secondary | ICD-10-CM | POA: Diagnosis not present

## 2014-10-20 DIAGNOSIS — Z7901 Long term (current) use of anticoagulants: Secondary | ICD-10-CM | POA: Diagnosis not present

## 2014-11-09 DIAGNOSIS — I2510 Atherosclerotic heart disease of native coronary artery without angina pectoris: Secondary | ICD-10-CM | POA: Diagnosis not present

## 2014-11-09 DIAGNOSIS — E668 Other obesity: Secondary | ICD-10-CM | POA: Diagnosis not present

## 2014-11-09 DIAGNOSIS — I5022 Chronic systolic (congestive) heart failure: Secondary | ICD-10-CM | POA: Diagnosis not present

## 2014-11-09 DIAGNOSIS — Z7901 Long term (current) use of anticoagulants: Secondary | ICD-10-CM | POA: Diagnosis not present

## 2014-11-09 DIAGNOSIS — I350 Nonrheumatic aortic (valve) stenosis: Secondary | ICD-10-CM | POA: Diagnosis not present

## 2014-11-09 DIAGNOSIS — I119 Hypertensive heart disease without heart failure: Secondary | ICD-10-CM | POA: Diagnosis not present

## 2014-11-09 DIAGNOSIS — N183 Chronic kidney disease, stage 3 (moderate): Secondary | ICD-10-CM | POA: Diagnosis not present

## 2014-11-09 DIAGNOSIS — I480 Paroxysmal atrial fibrillation: Secondary | ICD-10-CM | POA: Diagnosis not present

## 2014-11-09 DIAGNOSIS — E784 Other hyperlipidemia: Secondary | ICD-10-CM | POA: Diagnosis not present

## 2014-11-11 DIAGNOSIS — G4733 Obstructive sleep apnea (adult) (pediatric): Secondary | ICD-10-CM | POA: Diagnosis not present

## 2014-11-17 DIAGNOSIS — J45998 Other asthma: Secondary | ICD-10-CM | POA: Diagnosis not present

## 2014-11-17 DIAGNOSIS — G4733 Obstructive sleep apnea (adult) (pediatric): Secondary | ICD-10-CM | POA: Diagnosis not present

## 2014-11-19 DIAGNOSIS — M9905 Segmental and somatic dysfunction of pelvic region: Secondary | ICD-10-CM | POA: Diagnosis not present

## 2014-11-19 DIAGNOSIS — M545 Low back pain: Secondary | ICD-10-CM | POA: Diagnosis not present

## 2014-11-19 DIAGNOSIS — M9903 Segmental and somatic dysfunction of lumbar region: Secondary | ICD-10-CM | POA: Diagnosis not present

## 2014-11-19 DIAGNOSIS — M62830 Muscle spasm of back: Secondary | ICD-10-CM | POA: Diagnosis not present

## 2014-12-21 DIAGNOSIS — I119 Hypertensive heart disease without heart failure: Secondary | ICD-10-CM | POA: Diagnosis not present

## 2014-12-21 DIAGNOSIS — I2510 Atherosclerotic heart disease of native coronary artery without angina pectoris: Secondary | ICD-10-CM | POA: Diagnosis not present

## 2014-12-21 DIAGNOSIS — I350 Nonrheumatic aortic (valve) stenosis: Secondary | ICD-10-CM | POA: Diagnosis not present

## 2014-12-21 DIAGNOSIS — I5022 Chronic systolic (congestive) heart failure: Secondary | ICD-10-CM | POA: Diagnosis not present

## 2014-12-21 DIAGNOSIS — N183 Chronic kidney disease, stage 3 (moderate): Secondary | ICD-10-CM | POA: Diagnosis not present

## 2014-12-21 DIAGNOSIS — I480 Paroxysmal atrial fibrillation: Secondary | ICD-10-CM | POA: Diagnosis not present

## 2014-12-21 DIAGNOSIS — E668 Other obesity: Secondary | ICD-10-CM | POA: Diagnosis not present

## 2014-12-21 DIAGNOSIS — Z7901 Long term (current) use of anticoagulants: Secondary | ICD-10-CM | POA: Diagnosis not present

## 2014-12-21 DIAGNOSIS — E784 Other hyperlipidemia: Secondary | ICD-10-CM | POA: Diagnosis not present

## 2014-12-29 DIAGNOSIS — G4733 Obstructive sleep apnea (adult) (pediatric): Secondary | ICD-10-CM | POA: Diagnosis not present

## 2014-12-29 DIAGNOSIS — N183 Chronic kidney disease, stage 3 (moderate): Secondary | ICD-10-CM | POA: Diagnosis not present

## 2014-12-29 DIAGNOSIS — J45998 Other asthma: Secondary | ICD-10-CM | POA: Diagnosis not present

## 2015-01-04 DIAGNOSIS — I350 Nonrheumatic aortic (valve) stenosis: Secondary | ICD-10-CM | POA: Diagnosis not present

## 2015-01-04 DIAGNOSIS — I5022 Chronic systolic (congestive) heart failure: Secondary | ICD-10-CM | POA: Diagnosis not present

## 2015-01-04 DIAGNOSIS — E784 Other hyperlipidemia: Secondary | ICD-10-CM | POA: Diagnosis not present

## 2015-01-04 DIAGNOSIS — Z7901 Long term (current) use of anticoagulants: Secondary | ICD-10-CM | POA: Diagnosis not present

## 2015-01-04 DIAGNOSIS — N183 Chronic kidney disease, stage 3 (moderate): Secondary | ICD-10-CM | POA: Diagnosis not present

## 2015-01-04 DIAGNOSIS — I119 Hypertensive heart disease without heart failure: Secondary | ICD-10-CM | POA: Diagnosis not present

## 2015-01-04 DIAGNOSIS — I2510 Atherosclerotic heart disease of native coronary artery without angina pectoris: Secondary | ICD-10-CM | POA: Diagnosis not present

## 2015-01-04 DIAGNOSIS — I480 Paroxysmal atrial fibrillation: Secondary | ICD-10-CM | POA: Diagnosis not present

## 2015-01-04 DIAGNOSIS — E668 Other obesity: Secondary | ICD-10-CM | POA: Diagnosis not present

## 2015-01-15 DIAGNOSIS — M545 Low back pain: Secondary | ICD-10-CM | POA: Diagnosis not present

## 2015-01-15 DIAGNOSIS — M62830 Muscle spasm of back: Secondary | ICD-10-CM | POA: Diagnosis not present

## 2015-01-15 DIAGNOSIS — M9903 Segmental and somatic dysfunction of lumbar region: Secondary | ICD-10-CM | POA: Diagnosis not present

## 2015-01-15 DIAGNOSIS — M9905 Segmental and somatic dysfunction of pelvic region: Secondary | ICD-10-CM | POA: Diagnosis not present

## 2015-01-19 DIAGNOSIS — E668 Other obesity: Secondary | ICD-10-CM | POA: Diagnosis not present

## 2015-01-19 DIAGNOSIS — N183 Chronic kidney disease, stage 3 (moderate): Secondary | ICD-10-CM | POA: Diagnosis not present

## 2015-01-19 DIAGNOSIS — Z7901 Long term (current) use of anticoagulants: Secondary | ICD-10-CM | POA: Diagnosis not present

## 2015-01-19 DIAGNOSIS — I5022 Chronic systolic (congestive) heart failure: Secondary | ICD-10-CM | POA: Diagnosis not present

## 2015-01-19 DIAGNOSIS — I2510 Atherosclerotic heart disease of native coronary artery without angina pectoris: Secondary | ICD-10-CM | POA: Diagnosis not present

## 2015-01-19 DIAGNOSIS — I350 Nonrheumatic aortic (valve) stenosis: Secondary | ICD-10-CM | POA: Diagnosis not present

## 2015-01-19 DIAGNOSIS — Z8546 Personal history of malignant neoplasm of prostate: Secondary | ICD-10-CM | POA: Diagnosis not present

## 2015-01-19 DIAGNOSIS — E784 Other hyperlipidemia: Secondary | ICD-10-CM | POA: Diagnosis not present

## 2015-01-19 DIAGNOSIS — I480 Paroxysmal atrial fibrillation: Secondary | ICD-10-CM | POA: Diagnosis not present

## 2015-01-19 DIAGNOSIS — Z955 Presence of coronary angioplasty implant and graft: Secondary | ICD-10-CM | POA: Diagnosis not present

## 2015-01-19 DIAGNOSIS — Z86718 Personal history of other venous thrombosis and embolism: Secondary | ICD-10-CM | POA: Diagnosis not present

## 2015-01-19 DIAGNOSIS — I119 Hypertensive heart disease without heart failure: Secondary | ICD-10-CM | POA: Diagnosis not present

## 2015-01-21 DIAGNOSIS — M545 Low back pain: Secondary | ICD-10-CM | POA: Diagnosis not present

## 2015-01-21 DIAGNOSIS — M9903 Segmental and somatic dysfunction of lumbar region: Secondary | ICD-10-CM | POA: Diagnosis not present

## 2015-01-21 DIAGNOSIS — M9905 Segmental and somatic dysfunction of pelvic region: Secondary | ICD-10-CM | POA: Diagnosis not present

## 2015-01-21 DIAGNOSIS — M62830 Muscle spasm of back: Secondary | ICD-10-CM | POA: Diagnosis not present

## 2015-02-04 DIAGNOSIS — M545 Low back pain: Secondary | ICD-10-CM | POA: Diagnosis not present

## 2015-02-04 DIAGNOSIS — M9903 Segmental and somatic dysfunction of lumbar region: Secondary | ICD-10-CM | POA: Diagnosis not present

## 2015-02-04 DIAGNOSIS — M9905 Segmental and somatic dysfunction of pelvic region: Secondary | ICD-10-CM | POA: Diagnosis not present

## 2015-02-04 DIAGNOSIS — M62830 Muscle spasm of back: Secondary | ICD-10-CM | POA: Diagnosis not present

## 2015-02-15 DIAGNOSIS — R600 Localized edema: Secondary | ICD-10-CM | POA: Diagnosis not present

## 2015-02-15 DIAGNOSIS — K219 Gastro-esophageal reflux disease without esophagitis: Secondary | ICD-10-CM | POA: Diagnosis not present

## 2015-02-15 DIAGNOSIS — I4891 Unspecified atrial fibrillation: Secondary | ICD-10-CM | POA: Diagnosis not present

## 2015-02-15 DIAGNOSIS — N183 Chronic kidney disease, stage 3 (moderate): Secondary | ICD-10-CM | POA: Diagnosis not present

## 2015-02-15 DIAGNOSIS — E785 Hyperlipidemia, unspecified: Secondary | ICD-10-CM | POA: Diagnosis not present

## 2015-02-15 DIAGNOSIS — C61 Malignant neoplasm of prostate: Secondary | ICD-10-CM | POA: Diagnosis not present

## 2015-02-25 DIAGNOSIS — M545 Low back pain: Secondary | ICD-10-CM | POA: Diagnosis not present

## 2015-02-25 DIAGNOSIS — M62830 Muscle spasm of back: Secondary | ICD-10-CM | POA: Diagnosis not present

## 2015-02-25 DIAGNOSIS — M9905 Segmental and somatic dysfunction of pelvic region: Secondary | ICD-10-CM | POA: Diagnosis not present

## 2015-02-25 DIAGNOSIS — M9903 Segmental and somatic dysfunction of lumbar region: Secondary | ICD-10-CM | POA: Diagnosis not present

## 2015-03-17 DIAGNOSIS — Z7901 Long term (current) use of anticoagulants: Secondary | ICD-10-CM | POA: Diagnosis not present

## 2015-03-17 DIAGNOSIS — I350 Nonrheumatic aortic (valve) stenosis: Secondary | ICD-10-CM | POA: Diagnosis not present

## 2015-03-17 DIAGNOSIS — I119 Hypertensive heart disease without heart failure: Secondary | ICD-10-CM | POA: Diagnosis not present

## 2015-03-17 DIAGNOSIS — I480 Paroxysmal atrial fibrillation: Secondary | ICD-10-CM | POA: Diagnosis not present

## 2015-03-17 DIAGNOSIS — I2510 Atherosclerotic heart disease of native coronary artery without angina pectoris: Secondary | ICD-10-CM | POA: Diagnosis not present

## 2015-03-17 DIAGNOSIS — E668 Other obesity: Secondary | ICD-10-CM | POA: Diagnosis not present

## 2015-03-17 DIAGNOSIS — I5022 Chronic systolic (congestive) heart failure: Secondary | ICD-10-CM | POA: Diagnosis not present

## 2015-03-17 DIAGNOSIS — E784 Other hyperlipidemia: Secondary | ICD-10-CM | POA: Diagnosis not present

## 2015-03-17 DIAGNOSIS — N183 Chronic kidney disease, stage 3 (moderate): Secondary | ICD-10-CM | POA: Diagnosis not present

## 2015-03-18 DIAGNOSIS — M545 Low back pain: Secondary | ICD-10-CM | POA: Diagnosis not present

## 2015-03-18 DIAGNOSIS — M62830 Muscle spasm of back: Secondary | ICD-10-CM | POA: Diagnosis not present

## 2015-03-18 DIAGNOSIS — M9905 Segmental and somatic dysfunction of pelvic region: Secondary | ICD-10-CM | POA: Diagnosis not present

## 2015-03-18 DIAGNOSIS — M9903 Segmental and somatic dysfunction of lumbar region: Secondary | ICD-10-CM | POA: Diagnosis not present

## 2015-03-19 DIAGNOSIS — Z6841 Body Mass Index (BMI) 40.0 and over, adult: Secondary | ICD-10-CM | POA: Diagnosis not present

## 2015-03-19 DIAGNOSIS — N393 Stress incontinence (female) (male): Secondary | ICD-10-CM | POA: Diagnosis not present

## 2015-03-19 DIAGNOSIS — Z8546 Personal history of malignant neoplasm of prostate: Secondary | ICD-10-CM | POA: Diagnosis not present

## 2015-03-23 DIAGNOSIS — J0100 Acute maxillary sinusitis, unspecified: Secondary | ICD-10-CM | POA: Diagnosis not present

## 2015-03-31 DIAGNOSIS — I350 Nonrheumatic aortic (valve) stenosis: Secondary | ICD-10-CM | POA: Diagnosis not present

## 2015-03-31 DIAGNOSIS — E784 Other hyperlipidemia: Secondary | ICD-10-CM | POA: Diagnosis not present

## 2015-03-31 DIAGNOSIS — Z7901 Long term (current) use of anticoagulants: Secondary | ICD-10-CM | POA: Diagnosis not present

## 2015-03-31 DIAGNOSIS — I2510 Atherosclerotic heart disease of native coronary artery without angina pectoris: Secondary | ICD-10-CM | POA: Diagnosis not present

## 2015-03-31 DIAGNOSIS — I5022 Chronic systolic (congestive) heart failure: Secondary | ICD-10-CM | POA: Diagnosis not present

## 2015-03-31 DIAGNOSIS — I480 Paroxysmal atrial fibrillation: Secondary | ICD-10-CM | POA: Diagnosis not present

## 2015-03-31 DIAGNOSIS — E668 Other obesity: Secondary | ICD-10-CM | POA: Diagnosis not present

## 2015-03-31 DIAGNOSIS — I119 Hypertensive heart disease without heart failure: Secondary | ICD-10-CM | POA: Diagnosis not present

## 2015-03-31 DIAGNOSIS — N183 Chronic kidney disease, stage 3 (moderate): Secondary | ICD-10-CM | POA: Diagnosis not present

## 2015-04-05 DIAGNOSIS — M9903 Segmental and somatic dysfunction of lumbar region: Secondary | ICD-10-CM | POA: Diagnosis not present

## 2015-04-05 DIAGNOSIS — M9905 Segmental and somatic dysfunction of pelvic region: Secondary | ICD-10-CM | POA: Diagnosis not present

## 2015-04-05 DIAGNOSIS — M545 Low back pain: Secondary | ICD-10-CM | POA: Diagnosis not present

## 2015-04-05 DIAGNOSIS — Z8546 Personal history of malignant neoplasm of prostate: Secondary | ICD-10-CM | POA: Insufficient documentation

## 2015-04-05 DIAGNOSIS — I119 Hypertensive heart disease without heart failure: Secondary | ICD-10-CM | POA: Insufficient documentation

## 2015-04-05 DIAGNOSIS — M62830 Muscle spasm of back: Secondary | ICD-10-CM | POA: Diagnosis not present

## 2015-04-05 DIAGNOSIS — G4733 Obstructive sleep apnea (adult) (pediatric): Secondary | ICD-10-CM | POA: Diagnosis not present

## 2015-04-05 DIAGNOSIS — T8450XA Infection and inflammatory reaction due to unspecified internal joint prosthesis, initial encounter: Secondary | ICD-10-CM

## 2015-04-05 DIAGNOSIS — I350 Nonrheumatic aortic (valve) stenosis: Secondary | ICD-10-CM

## 2015-04-05 DIAGNOSIS — I2510 Atherosclerotic heart disease of native coronary artery without angina pectoris: Secondary | ICD-10-CM | POA: Insufficient documentation

## 2015-04-05 HISTORY — DX: Atherosclerotic heart disease of native coronary artery without angina pectoris: I25.10

## 2015-04-05 HISTORY — DX: Nonrheumatic aortic (valve) stenosis: I35.0

## 2015-04-05 HISTORY — DX: Infection and inflammatory reaction due to unspecified internal joint prosthesis, initial encounter: T84.50XA

## 2015-04-12 DIAGNOSIS — M62830 Muscle spasm of back: Secondary | ICD-10-CM | POA: Diagnosis not present

## 2015-04-12 DIAGNOSIS — M545 Low back pain: Secondary | ICD-10-CM | POA: Diagnosis not present

## 2015-04-12 DIAGNOSIS — M9905 Segmental and somatic dysfunction of pelvic region: Secondary | ICD-10-CM | POA: Diagnosis not present

## 2015-04-12 DIAGNOSIS — M9903 Segmental and somatic dysfunction of lumbar region: Secondary | ICD-10-CM | POA: Diagnosis not present

## 2015-04-21 DIAGNOSIS — M9903 Segmental and somatic dysfunction of lumbar region: Secondary | ICD-10-CM | POA: Diagnosis not present

## 2015-04-21 DIAGNOSIS — M9901 Segmental and somatic dysfunction of cervical region: Secondary | ICD-10-CM | POA: Diagnosis not present

## 2015-04-21 DIAGNOSIS — M9905 Segmental and somatic dysfunction of pelvic region: Secondary | ICD-10-CM | POA: Diagnosis not present

## 2015-04-21 DIAGNOSIS — M62830 Muscle spasm of back: Secondary | ICD-10-CM | POA: Diagnosis not present

## 2015-04-21 DIAGNOSIS — M545 Low back pain: Secondary | ICD-10-CM | POA: Diagnosis not present

## 2015-04-28 DIAGNOSIS — E784 Other hyperlipidemia: Secondary | ICD-10-CM | POA: Diagnosis not present

## 2015-04-28 DIAGNOSIS — N183 Chronic kidney disease, stage 3 (moderate): Secondary | ICD-10-CM | POA: Diagnosis not present

## 2015-04-28 DIAGNOSIS — Z8546 Personal history of malignant neoplasm of prostate: Secondary | ICD-10-CM | POA: Diagnosis not present

## 2015-04-28 DIAGNOSIS — I350 Nonrheumatic aortic (valve) stenosis: Secondary | ICD-10-CM | POA: Diagnosis not present

## 2015-04-28 DIAGNOSIS — I5022 Chronic systolic (congestive) heart failure: Secondary | ICD-10-CM | POA: Diagnosis not present

## 2015-04-28 DIAGNOSIS — Z7901 Long term (current) use of anticoagulants: Secondary | ICD-10-CM | POA: Diagnosis not present

## 2015-04-28 DIAGNOSIS — Z86718 Personal history of other venous thrombosis and embolism: Secondary | ICD-10-CM | POA: Diagnosis not present

## 2015-04-28 DIAGNOSIS — I2510 Atherosclerotic heart disease of native coronary artery without angina pectoris: Secondary | ICD-10-CM | POA: Diagnosis not present

## 2015-04-28 DIAGNOSIS — E668 Other obesity: Secondary | ICD-10-CM | POA: Diagnosis not present

## 2015-04-28 DIAGNOSIS — Z955 Presence of coronary angioplasty implant and graft: Secondary | ICD-10-CM | POA: Diagnosis not present

## 2015-04-28 DIAGNOSIS — I119 Hypertensive heart disease without heart failure: Secondary | ICD-10-CM | POA: Diagnosis not present

## 2015-04-28 DIAGNOSIS — I480 Paroxysmal atrial fibrillation: Secondary | ICD-10-CM | POA: Diagnosis not present

## 2015-05-24 DIAGNOSIS — M9905 Segmental and somatic dysfunction of pelvic region: Secondary | ICD-10-CM | POA: Diagnosis not present

## 2015-05-24 DIAGNOSIS — M9901 Segmental and somatic dysfunction of cervical region: Secondary | ICD-10-CM | POA: Diagnosis not present

## 2015-05-24 DIAGNOSIS — M545 Low back pain: Secondary | ICD-10-CM | POA: Diagnosis not present

## 2015-05-24 DIAGNOSIS — M9903 Segmental and somatic dysfunction of lumbar region: Secondary | ICD-10-CM | POA: Diagnosis not present

## 2015-05-24 DIAGNOSIS — M62830 Muscle spasm of back: Secondary | ICD-10-CM | POA: Diagnosis not present

## 2015-05-26 DIAGNOSIS — E668 Other obesity: Secondary | ICD-10-CM | POA: Diagnosis not present

## 2015-05-26 DIAGNOSIS — I480 Paroxysmal atrial fibrillation: Secondary | ICD-10-CM | POA: Diagnosis not present

## 2015-05-26 DIAGNOSIS — E784 Other hyperlipidemia: Secondary | ICD-10-CM | POA: Diagnosis not present

## 2015-05-26 DIAGNOSIS — I350 Nonrheumatic aortic (valve) stenosis: Secondary | ICD-10-CM | POA: Diagnosis not present

## 2015-05-26 DIAGNOSIS — I2510 Atherosclerotic heart disease of native coronary artery without angina pectoris: Secondary | ICD-10-CM | POA: Diagnosis not present

## 2015-05-26 DIAGNOSIS — Z7901 Long term (current) use of anticoagulants: Secondary | ICD-10-CM | POA: Diagnosis not present

## 2015-05-26 DIAGNOSIS — I119 Hypertensive heart disease without heart failure: Secondary | ICD-10-CM | POA: Diagnosis not present

## 2015-05-26 DIAGNOSIS — N183 Chronic kidney disease, stage 3 (moderate): Secondary | ICD-10-CM | POA: Diagnosis not present

## 2015-05-26 DIAGNOSIS — I5022 Chronic systolic (congestive) heart failure: Secondary | ICD-10-CM | POA: Diagnosis not present

## 2015-06-09 DIAGNOSIS — M12811 Other specific arthropathies, not elsewhere classified, right shoulder: Secondary | ICD-10-CM | POA: Diagnosis not present

## 2015-06-21 DIAGNOSIS — M9901 Segmental and somatic dysfunction of cervical region: Secondary | ICD-10-CM | POA: Diagnosis not present

## 2015-06-21 DIAGNOSIS — M545 Low back pain: Secondary | ICD-10-CM | POA: Diagnosis not present

## 2015-06-21 DIAGNOSIS — M9905 Segmental and somatic dysfunction of pelvic region: Secondary | ICD-10-CM | POA: Diagnosis not present

## 2015-06-21 DIAGNOSIS — M62830 Muscle spasm of back: Secondary | ICD-10-CM | POA: Diagnosis not present

## 2015-06-21 DIAGNOSIS — M9903 Segmental and somatic dysfunction of lumbar region: Secondary | ICD-10-CM | POA: Diagnosis not present

## 2015-06-23 DIAGNOSIS — I2510 Atherosclerotic heart disease of native coronary artery without angina pectoris: Secondary | ICD-10-CM | POA: Diagnosis not present

## 2015-06-23 DIAGNOSIS — E784 Other hyperlipidemia: Secondary | ICD-10-CM | POA: Diagnosis not present

## 2015-06-23 DIAGNOSIS — I5022 Chronic systolic (congestive) heart failure: Secondary | ICD-10-CM | POA: Diagnosis not present

## 2015-06-23 DIAGNOSIS — I480 Paroxysmal atrial fibrillation: Secondary | ICD-10-CM | POA: Diagnosis not present

## 2015-06-23 DIAGNOSIS — N183 Chronic kidney disease, stage 3 (moderate): Secondary | ICD-10-CM | POA: Diagnosis not present

## 2015-06-23 DIAGNOSIS — I350 Nonrheumatic aortic (valve) stenosis: Secondary | ICD-10-CM | POA: Diagnosis not present

## 2015-06-23 DIAGNOSIS — I119 Hypertensive heart disease without heart failure: Secondary | ICD-10-CM | POA: Diagnosis not present

## 2015-06-23 DIAGNOSIS — E668 Other obesity: Secondary | ICD-10-CM | POA: Diagnosis not present

## 2015-06-23 DIAGNOSIS — Z7901 Long term (current) use of anticoagulants: Secondary | ICD-10-CM | POA: Diagnosis not present

## 2015-06-28 DIAGNOSIS — J209 Acute bronchitis, unspecified: Secondary | ICD-10-CM | POA: Diagnosis not present

## 2015-07-07 DIAGNOSIS — I350 Nonrheumatic aortic (valve) stenosis: Secondary | ICD-10-CM | POA: Diagnosis not present

## 2015-07-07 DIAGNOSIS — N183 Chronic kidney disease, stage 3 (moderate): Secondary | ICD-10-CM | POA: Diagnosis not present

## 2015-07-07 DIAGNOSIS — I5022 Chronic systolic (congestive) heart failure: Secondary | ICD-10-CM | POA: Diagnosis not present

## 2015-07-07 DIAGNOSIS — Z7901 Long term (current) use of anticoagulants: Secondary | ICD-10-CM | POA: Diagnosis not present

## 2015-07-07 DIAGNOSIS — I119 Hypertensive heart disease without heart failure: Secondary | ICD-10-CM | POA: Diagnosis not present

## 2015-07-07 DIAGNOSIS — I480 Paroxysmal atrial fibrillation: Secondary | ICD-10-CM | POA: Diagnosis not present

## 2015-07-07 DIAGNOSIS — I2510 Atherosclerotic heart disease of native coronary artery without angina pectoris: Secondary | ICD-10-CM | POA: Diagnosis not present

## 2015-07-07 DIAGNOSIS — E784 Other hyperlipidemia: Secondary | ICD-10-CM | POA: Diagnosis not present

## 2015-07-07 DIAGNOSIS — E668 Other obesity: Secondary | ICD-10-CM | POA: Diagnosis not present

## 2015-07-21 DIAGNOSIS — E784 Other hyperlipidemia: Secondary | ICD-10-CM | POA: Diagnosis not present

## 2015-07-21 DIAGNOSIS — I350 Nonrheumatic aortic (valve) stenosis: Secondary | ICD-10-CM | POA: Diagnosis not present

## 2015-07-21 DIAGNOSIS — Z7901 Long term (current) use of anticoagulants: Secondary | ICD-10-CM | POA: Diagnosis not present

## 2015-07-21 DIAGNOSIS — E668 Other obesity: Secondary | ICD-10-CM | POA: Diagnosis not present

## 2015-07-21 DIAGNOSIS — I2510 Atherosclerotic heart disease of native coronary artery without angina pectoris: Secondary | ICD-10-CM | POA: Diagnosis not present

## 2015-07-21 DIAGNOSIS — N183 Chronic kidney disease, stage 3 (moderate): Secondary | ICD-10-CM | POA: Diagnosis not present

## 2015-07-21 DIAGNOSIS — I480 Paroxysmal atrial fibrillation: Secondary | ICD-10-CM | POA: Diagnosis not present

## 2015-07-21 DIAGNOSIS — I5022 Chronic systolic (congestive) heart failure: Secondary | ICD-10-CM | POA: Diagnosis not present

## 2015-07-21 DIAGNOSIS — I119 Hypertensive heart disease without heart failure: Secondary | ICD-10-CM | POA: Diagnosis not present

## 2015-07-22 DIAGNOSIS — M545 Low back pain: Secondary | ICD-10-CM | POA: Diagnosis not present

## 2015-07-22 DIAGNOSIS — M9905 Segmental and somatic dysfunction of pelvic region: Secondary | ICD-10-CM | POA: Diagnosis not present

## 2015-07-22 DIAGNOSIS — M62830 Muscle spasm of back: Secondary | ICD-10-CM | POA: Diagnosis not present

## 2015-07-22 DIAGNOSIS — M9903 Segmental and somatic dysfunction of lumbar region: Secondary | ICD-10-CM | POA: Diagnosis not present

## 2015-07-22 DIAGNOSIS — M9901 Segmental and somatic dysfunction of cervical region: Secondary | ICD-10-CM | POA: Diagnosis not present

## 2015-07-30 DIAGNOSIS — H1131 Conjunctival hemorrhage, right eye: Secondary | ICD-10-CM | POA: Diagnosis not present

## 2015-07-30 DIAGNOSIS — H2513 Age-related nuclear cataract, bilateral: Secondary | ICD-10-CM | POA: Diagnosis not present

## 2015-08-17 DIAGNOSIS — R05 Cough: Secondary | ICD-10-CM | POA: Diagnosis not present

## 2015-08-17 DIAGNOSIS — R600 Localized edema: Secondary | ICD-10-CM | POA: Diagnosis not present

## 2015-08-17 DIAGNOSIS — K219 Gastro-esophageal reflux disease without esophagitis: Secondary | ICD-10-CM | POA: Diagnosis not present

## 2015-08-17 DIAGNOSIS — N183 Chronic kidney disease, stage 3 (moderate): Secondary | ICD-10-CM | POA: Diagnosis not present

## 2015-08-17 DIAGNOSIS — E785 Hyperlipidemia, unspecified: Secondary | ICD-10-CM | POA: Diagnosis not present

## 2015-08-18 DIAGNOSIS — E784 Other hyperlipidemia: Secondary | ICD-10-CM | POA: Diagnosis not present

## 2015-08-18 DIAGNOSIS — I119 Hypertensive heart disease without heart failure: Secondary | ICD-10-CM | POA: Diagnosis not present

## 2015-08-18 DIAGNOSIS — I5022 Chronic systolic (congestive) heart failure: Secondary | ICD-10-CM | POA: Diagnosis not present

## 2015-08-18 DIAGNOSIS — E668 Other obesity: Secondary | ICD-10-CM | POA: Diagnosis not present

## 2015-08-18 DIAGNOSIS — I480 Paroxysmal atrial fibrillation: Secondary | ICD-10-CM | POA: Diagnosis not present

## 2015-08-18 DIAGNOSIS — I2510 Atherosclerotic heart disease of native coronary artery without angina pectoris: Secondary | ICD-10-CM | POA: Diagnosis not present

## 2015-08-18 DIAGNOSIS — Z7901 Long term (current) use of anticoagulants: Secondary | ICD-10-CM | POA: Diagnosis not present

## 2015-08-18 DIAGNOSIS — I350 Nonrheumatic aortic (valve) stenosis: Secondary | ICD-10-CM | POA: Diagnosis not present

## 2015-08-18 DIAGNOSIS — N183 Chronic kidney disease, stage 3 (moderate): Secondary | ICD-10-CM | POA: Diagnosis not present

## 2015-08-19 DIAGNOSIS — M62830 Muscle spasm of back: Secondary | ICD-10-CM | POA: Diagnosis not present

## 2015-08-19 DIAGNOSIS — M9903 Segmental and somatic dysfunction of lumbar region: Secondary | ICD-10-CM | POA: Diagnosis not present

## 2015-08-19 DIAGNOSIS — M9905 Segmental and somatic dysfunction of pelvic region: Secondary | ICD-10-CM | POA: Diagnosis not present

## 2015-08-19 DIAGNOSIS — M545 Low back pain: Secondary | ICD-10-CM | POA: Diagnosis not present

## 2015-08-19 DIAGNOSIS — M9901 Segmental and somatic dysfunction of cervical region: Secondary | ICD-10-CM | POA: Diagnosis not present

## 2015-08-27 DIAGNOSIS — H2513 Age-related nuclear cataract, bilateral: Secondary | ICD-10-CM | POA: Diagnosis not present

## 2015-08-31 DIAGNOSIS — M545 Low back pain: Secondary | ICD-10-CM | POA: Diagnosis not present

## 2015-08-31 DIAGNOSIS — M62830 Muscle spasm of back: Secondary | ICD-10-CM | POA: Diagnosis not present

## 2015-08-31 DIAGNOSIS — M9901 Segmental and somatic dysfunction of cervical region: Secondary | ICD-10-CM | POA: Diagnosis not present

## 2015-08-31 DIAGNOSIS — M9905 Segmental and somatic dysfunction of pelvic region: Secondary | ICD-10-CM | POA: Diagnosis not present

## 2015-08-31 DIAGNOSIS — M9903 Segmental and somatic dysfunction of lumbar region: Secondary | ICD-10-CM | POA: Diagnosis not present

## 2015-09-01 DIAGNOSIS — M9901 Segmental and somatic dysfunction of cervical region: Secondary | ICD-10-CM | POA: Diagnosis not present

## 2015-09-01 DIAGNOSIS — M62830 Muscle spasm of back: Secondary | ICD-10-CM | POA: Diagnosis not present

## 2015-09-01 DIAGNOSIS — M9903 Segmental and somatic dysfunction of lumbar region: Secondary | ICD-10-CM | POA: Diagnosis not present

## 2015-09-01 DIAGNOSIS — M9905 Segmental and somatic dysfunction of pelvic region: Secondary | ICD-10-CM | POA: Diagnosis not present

## 2015-09-01 DIAGNOSIS — M545 Low back pain: Secondary | ICD-10-CM | POA: Diagnosis not present

## 2015-09-03 DIAGNOSIS — M9905 Segmental and somatic dysfunction of pelvic region: Secondary | ICD-10-CM | POA: Diagnosis not present

## 2015-09-03 DIAGNOSIS — M9901 Segmental and somatic dysfunction of cervical region: Secondary | ICD-10-CM | POA: Diagnosis not present

## 2015-09-03 DIAGNOSIS — M62830 Muscle spasm of back: Secondary | ICD-10-CM | POA: Diagnosis not present

## 2015-09-03 DIAGNOSIS — M9903 Segmental and somatic dysfunction of lumbar region: Secondary | ICD-10-CM | POA: Diagnosis not present

## 2015-09-03 DIAGNOSIS — M545 Low back pain: Secondary | ICD-10-CM | POA: Diagnosis not present

## 2015-09-15 DIAGNOSIS — E668 Other obesity: Secondary | ICD-10-CM | POA: Diagnosis not present

## 2015-09-15 DIAGNOSIS — Z7901 Long term (current) use of anticoagulants: Secondary | ICD-10-CM | POA: Diagnosis not present

## 2015-09-15 DIAGNOSIS — I480 Paroxysmal atrial fibrillation: Secondary | ICD-10-CM | POA: Diagnosis not present

## 2015-09-15 DIAGNOSIS — E784 Other hyperlipidemia: Secondary | ICD-10-CM | POA: Diagnosis not present

## 2015-09-15 DIAGNOSIS — N183 Chronic kidney disease, stage 3 (moderate): Secondary | ICD-10-CM | POA: Diagnosis not present

## 2015-09-15 DIAGNOSIS — I2510 Atherosclerotic heart disease of native coronary artery without angina pectoris: Secondary | ICD-10-CM | POA: Diagnosis not present

## 2015-09-15 DIAGNOSIS — I119 Hypertensive heart disease without heart failure: Secondary | ICD-10-CM | POA: Diagnosis not present

## 2015-09-15 DIAGNOSIS — I5022 Chronic systolic (congestive) heart failure: Secondary | ICD-10-CM | POA: Diagnosis not present

## 2015-09-15 DIAGNOSIS — I350 Nonrheumatic aortic (valve) stenosis: Secondary | ICD-10-CM | POA: Diagnosis not present

## 2015-09-20 DIAGNOSIS — H2511 Age-related nuclear cataract, right eye: Secondary | ICD-10-CM | POA: Diagnosis not present

## 2015-10-01 DIAGNOSIS — M9905 Segmental and somatic dysfunction of pelvic region: Secondary | ICD-10-CM | POA: Diagnosis not present

## 2015-10-01 DIAGNOSIS — M62830 Muscle spasm of back: Secondary | ICD-10-CM | POA: Diagnosis not present

## 2015-10-01 DIAGNOSIS — M9901 Segmental and somatic dysfunction of cervical region: Secondary | ICD-10-CM | POA: Diagnosis not present

## 2015-10-01 DIAGNOSIS — M545 Low back pain: Secondary | ICD-10-CM | POA: Diagnosis not present

## 2015-10-01 DIAGNOSIS — M9903 Segmental and somatic dysfunction of lumbar region: Secondary | ICD-10-CM | POA: Diagnosis not present

## 2015-10-04 DIAGNOSIS — I350 Nonrheumatic aortic (valve) stenosis: Secondary | ICD-10-CM | POA: Diagnosis not present

## 2015-10-04 DIAGNOSIS — I4891 Unspecified atrial fibrillation: Secondary | ICD-10-CM | POA: Diagnosis not present

## 2015-10-04 DIAGNOSIS — R0602 Shortness of breath: Secondary | ICD-10-CM | POA: Diagnosis not present

## 2015-10-04 DIAGNOSIS — J4540 Moderate persistent asthma, uncomplicated: Secondary | ICD-10-CM | POA: Diagnosis not present

## 2015-10-04 DIAGNOSIS — G4733 Obstructive sleep apnea (adult) (pediatric): Secondary | ICD-10-CM | POA: Diagnosis not present

## 2015-10-04 DIAGNOSIS — J984 Other disorders of lung: Secondary | ICD-10-CM | POA: Diagnosis not present

## 2015-10-07 DIAGNOSIS — H2512 Age-related nuclear cataract, left eye: Secondary | ICD-10-CM | POA: Diagnosis not present

## 2015-10-13 DIAGNOSIS — I5022 Chronic systolic (congestive) heart failure: Secondary | ICD-10-CM | POA: Diagnosis not present

## 2015-10-13 DIAGNOSIS — Z7901 Long term (current) use of anticoagulants: Secondary | ICD-10-CM | POA: Diagnosis not present

## 2015-10-13 DIAGNOSIS — E784 Other hyperlipidemia: Secondary | ICD-10-CM | POA: Diagnosis not present

## 2015-10-13 DIAGNOSIS — I480 Paroxysmal atrial fibrillation: Secondary | ICD-10-CM | POA: Diagnosis not present

## 2015-10-13 DIAGNOSIS — E668 Other obesity: Secondary | ICD-10-CM | POA: Diagnosis not present

## 2015-10-13 DIAGNOSIS — I350 Nonrheumatic aortic (valve) stenosis: Secondary | ICD-10-CM | POA: Diagnosis not present

## 2015-10-13 DIAGNOSIS — N183 Chronic kidney disease, stage 3 (moderate): Secondary | ICD-10-CM | POA: Diagnosis not present

## 2015-10-13 DIAGNOSIS — I119 Hypertensive heart disease without heart failure: Secondary | ICD-10-CM | POA: Diagnosis not present

## 2015-10-13 DIAGNOSIS — I2510 Atherosclerotic heart disease of native coronary artery without angina pectoris: Secondary | ICD-10-CM | POA: Diagnosis not present

## 2015-10-18 DIAGNOSIS — M545 Low back pain: Secondary | ICD-10-CM | POA: Diagnosis not present

## 2015-10-18 DIAGNOSIS — M62830 Muscle spasm of back: Secondary | ICD-10-CM | POA: Diagnosis not present

## 2015-10-18 DIAGNOSIS — M9905 Segmental and somatic dysfunction of pelvic region: Secondary | ICD-10-CM | POA: Diagnosis not present

## 2015-10-18 DIAGNOSIS — M9903 Segmental and somatic dysfunction of lumbar region: Secondary | ICD-10-CM | POA: Diagnosis not present

## 2015-10-18 DIAGNOSIS — M9901 Segmental and somatic dysfunction of cervical region: Secondary | ICD-10-CM | POA: Diagnosis not present

## 2015-11-01 DIAGNOSIS — Z86718 Personal history of other venous thrombosis and embolism: Secondary | ICD-10-CM | POA: Diagnosis not present

## 2015-11-01 DIAGNOSIS — E668 Other obesity: Secondary | ICD-10-CM | POA: Diagnosis not present

## 2015-11-01 DIAGNOSIS — Z7901 Long term (current) use of anticoagulants: Secondary | ICD-10-CM | POA: Diagnosis not present

## 2015-11-01 DIAGNOSIS — N183 Chronic kidney disease, stage 3 (moderate): Secondary | ICD-10-CM | POA: Diagnosis not present

## 2015-11-01 DIAGNOSIS — Z8546 Personal history of malignant neoplasm of prostate: Secondary | ICD-10-CM | POA: Diagnosis not present

## 2015-11-01 DIAGNOSIS — Z955 Presence of coronary angioplasty implant and graft: Secondary | ICD-10-CM | POA: Diagnosis not present

## 2015-11-01 DIAGNOSIS — I119 Hypertensive heart disease without heart failure: Secondary | ICD-10-CM | POA: Diagnosis not present

## 2015-11-01 DIAGNOSIS — E784 Other hyperlipidemia: Secondary | ICD-10-CM | POA: Diagnosis not present

## 2015-11-01 DIAGNOSIS — I2510 Atherosclerotic heart disease of native coronary artery without angina pectoris: Secondary | ICD-10-CM | POA: Diagnosis not present

## 2015-11-01 DIAGNOSIS — I350 Nonrheumatic aortic (valve) stenosis: Secondary | ICD-10-CM | POA: Diagnosis not present

## 2015-11-01 DIAGNOSIS — I480 Paroxysmal atrial fibrillation: Secondary | ICD-10-CM | POA: Diagnosis not present

## 2015-11-01 DIAGNOSIS — I5022 Chronic systolic (congestive) heart failure: Secondary | ICD-10-CM | POA: Diagnosis not present

## 2015-11-03 DIAGNOSIS — R0602 Shortness of breath: Secondary | ICD-10-CM | POA: Diagnosis not present

## 2015-11-08 DIAGNOSIS — H02105 Unspecified ectropion of left lower eyelid: Secondary | ICD-10-CM | POA: Diagnosis not present

## 2015-11-08 DIAGNOSIS — H02102 Unspecified ectropion of right lower eyelid: Secondary | ICD-10-CM | POA: Diagnosis not present

## 2015-11-15 DIAGNOSIS — E041 Nontoxic single thyroid nodule: Secondary | ICD-10-CM | POA: Diagnosis not present

## 2015-11-15 DIAGNOSIS — I350 Nonrheumatic aortic (valve) stenosis: Secondary | ICD-10-CM | POA: Diagnosis not present

## 2015-11-15 DIAGNOSIS — G4733 Obstructive sleep apnea (adult) (pediatric): Secondary | ICD-10-CM

## 2015-11-15 DIAGNOSIS — I2510 Atherosclerotic heart disease of native coronary artery without angina pectoris: Secondary | ICD-10-CM | POA: Diagnosis not present

## 2015-11-15 DIAGNOSIS — I4891 Unspecified atrial fibrillation: Secondary | ICD-10-CM | POA: Diagnosis not present

## 2015-11-15 DIAGNOSIS — J984 Other disorders of lung: Secondary | ICD-10-CM | POA: Diagnosis not present

## 2015-11-15 DIAGNOSIS — R911 Solitary pulmonary nodule: Secondary | ICD-10-CM | POA: Diagnosis not present

## 2015-11-15 DIAGNOSIS — J4540 Moderate persistent asthma, uncomplicated: Secondary | ICD-10-CM | POA: Diagnosis not present

## 2015-11-15 DIAGNOSIS — I700 Atherosclerosis of aorta: Secondary | ICD-10-CM | POA: Diagnosis not present

## 2015-11-15 DIAGNOSIS — R918 Other nonspecific abnormal finding of lung field: Secondary | ICD-10-CM | POA: Diagnosis not present

## 2015-11-15 HISTORY — DX: Obstructive sleep apnea (adult) (pediatric): G47.33

## 2015-12-01 DIAGNOSIS — I350 Nonrheumatic aortic (valve) stenosis: Secondary | ICD-10-CM | POA: Diagnosis not present

## 2015-12-01 DIAGNOSIS — I2510 Atherosclerotic heart disease of native coronary artery without angina pectoris: Secondary | ICD-10-CM | POA: Diagnosis not present

## 2015-12-01 DIAGNOSIS — I480 Paroxysmal atrial fibrillation: Secondary | ICD-10-CM | POA: Diagnosis not present

## 2015-12-01 DIAGNOSIS — E784 Other hyperlipidemia: Secondary | ICD-10-CM | POA: Diagnosis not present

## 2015-12-01 DIAGNOSIS — Z7901 Long term (current) use of anticoagulants: Secondary | ICD-10-CM | POA: Diagnosis not present

## 2015-12-01 DIAGNOSIS — I5022 Chronic systolic (congestive) heart failure: Secondary | ICD-10-CM | POA: Diagnosis not present

## 2015-12-01 DIAGNOSIS — I119 Hypertensive heart disease without heart failure: Secondary | ICD-10-CM | POA: Diagnosis not present

## 2015-12-01 DIAGNOSIS — E668 Other obesity: Secondary | ICD-10-CM | POA: Diagnosis not present

## 2015-12-01 DIAGNOSIS — N183 Chronic kidney disease, stage 3 (moderate): Secondary | ICD-10-CM | POA: Diagnosis not present

## 2015-12-06 DIAGNOSIS — H02102 Unspecified ectropion of right lower eyelid: Secondary | ICD-10-CM | POA: Diagnosis not present

## 2015-12-06 DIAGNOSIS — H02105 Unspecified ectropion of left lower eyelid: Secondary | ICD-10-CM | POA: Diagnosis not present

## 2015-12-29 DIAGNOSIS — Z7901 Long term (current) use of anticoagulants: Secondary | ICD-10-CM | POA: Diagnosis not present

## 2015-12-29 DIAGNOSIS — I2510 Atherosclerotic heart disease of native coronary artery without angina pectoris: Secondary | ICD-10-CM | POA: Diagnosis not present

## 2015-12-29 DIAGNOSIS — I480 Paroxysmal atrial fibrillation: Secondary | ICD-10-CM | POA: Diagnosis not present

## 2015-12-29 DIAGNOSIS — I5022 Chronic systolic (congestive) heart failure: Secondary | ICD-10-CM | POA: Diagnosis not present

## 2015-12-29 DIAGNOSIS — I350 Nonrheumatic aortic (valve) stenosis: Secondary | ICD-10-CM | POA: Diagnosis not present

## 2015-12-29 DIAGNOSIS — N183 Chronic kidney disease, stage 3 (moderate): Secondary | ICD-10-CM | POA: Diagnosis not present

## 2015-12-29 DIAGNOSIS — E668 Other obesity: Secondary | ICD-10-CM | POA: Diagnosis not present

## 2015-12-29 DIAGNOSIS — E784 Other hyperlipidemia: Secondary | ICD-10-CM | POA: Diagnosis not present

## 2015-12-29 DIAGNOSIS — I119 Hypertensive heart disease without heart failure: Secondary | ICD-10-CM | POA: Diagnosis not present

## 2016-01-06 DIAGNOSIS — M62830 Muscle spasm of back: Secondary | ICD-10-CM | POA: Diagnosis not present

## 2016-01-06 DIAGNOSIS — M545 Low back pain: Secondary | ICD-10-CM | POA: Diagnosis not present

## 2016-01-06 DIAGNOSIS — M9903 Segmental and somatic dysfunction of lumbar region: Secondary | ICD-10-CM | POA: Diagnosis not present

## 2016-01-06 DIAGNOSIS — M9901 Segmental and somatic dysfunction of cervical region: Secondary | ICD-10-CM | POA: Diagnosis not present

## 2016-01-06 DIAGNOSIS — M9905 Segmental and somatic dysfunction of pelvic region: Secondary | ICD-10-CM | POA: Diagnosis not present

## 2016-01-21 DIAGNOSIS — M9905 Segmental and somatic dysfunction of pelvic region: Secondary | ICD-10-CM | POA: Diagnosis not present

## 2016-01-21 DIAGNOSIS — M545 Low back pain: Secondary | ICD-10-CM | POA: Diagnosis not present

## 2016-01-21 DIAGNOSIS — M62830 Muscle spasm of back: Secondary | ICD-10-CM | POA: Diagnosis not present

## 2016-01-21 DIAGNOSIS — M9901 Segmental and somatic dysfunction of cervical region: Secondary | ICD-10-CM | POA: Diagnosis not present

## 2016-01-21 DIAGNOSIS — M9903 Segmental and somatic dysfunction of lumbar region: Secondary | ICD-10-CM | POA: Diagnosis not present

## 2016-01-24 DIAGNOSIS — Z86718 Personal history of other venous thrombosis and embolism: Secondary | ICD-10-CM | POA: Insufficient documentation

## 2016-01-25 DIAGNOSIS — J4540 Moderate persistent asthma, uncomplicated: Secondary | ICD-10-CM | POA: Diagnosis not present

## 2016-01-26 DIAGNOSIS — I480 Paroxysmal atrial fibrillation: Secondary | ICD-10-CM | POA: Diagnosis not present

## 2016-01-26 DIAGNOSIS — E784 Other hyperlipidemia: Secondary | ICD-10-CM | POA: Diagnosis not present

## 2016-01-26 DIAGNOSIS — I5022 Chronic systolic (congestive) heart failure: Secondary | ICD-10-CM | POA: Diagnosis not present

## 2016-01-26 DIAGNOSIS — N183 Chronic kidney disease, stage 3 (moderate): Secondary | ICD-10-CM | POA: Diagnosis not present

## 2016-01-26 DIAGNOSIS — Z7901 Long term (current) use of anticoagulants: Secondary | ICD-10-CM | POA: Diagnosis not present

## 2016-01-26 DIAGNOSIS — E668 Other obesity: Secondary | ICD-10-CM | POA: Diagnosis not present

## 2016-01-26 DIAGNOSIS — I350 Nonrheumatic aortic (valve) stenosis: Secondary | ICD-10-CM | POA: Diagnosis not present

## 2016-01-26 DIAGNOSIS — I2510 Atherosclerotic heart disease of native coronary artery without angina pectoris: Secondary | ICD-10-CM | POA: Diagnosis not present

## 2016-01-26 DIAGNOSIS — I119 Hypertensive heart disease without heart failure: Secondary | ICD-10-CM | POA: Diagnosis not present

## 2016-02-02 DIAGNOSIS — M9905 Segmental and somatic dysfunction of pelvic region: Secondary | ICD-10-CM | POA: Diagnosis not present

## 2016-02-02 DIAGNOSIS — M62830 Muscle spasm of back: Secondary | ICD-10-CM | POA: Diagnosis not present

## 2016-02-02 DIAGNOSIS — M545 Low back pain: Secondary | ICD-10-CM | POA: Diagnosis not present

## 2016-02-02 DIAGNOSIS — M9901 Segmental and somatic dysfunction of cervical region: Secondary | ICD-10-CM | POA: Diagnosis not present

## 2016-02-02 DIAGNOSIS — M9903 Segmental and somatic dysfunction of lumbar region: Secondary | ICD-10-CM | POA: Diagnosis not present

## 2016-02-04 DIAGNOSIS — M545 Low back pain: Secondary | ICD-10-CM | POA: Diagnosis not present

## 2016-02-04 DIAGNOSIS — M9905 Segmental and somatic dysfunction of pelvic region: Secondary | ICD-10-CM | POA: Diagnosis not present

## 2016-02-04 DIAGNOSIS — M9901 Segmental and somatic dysfunction of cervical region: Secondary | ICD-10-CM | POA: Diagnosis not present

## 2016-02-04 DIAGNOSIS — M9903 Segmental and somatic dysfunction of lumbar region: Secondary | ICD-10-CM | POA: Diagnosis not present

## 2016-02-04 DIAGNOSIS — M62830 Muscle spasm of back: Secondary | ICD-10-CM | POA: Diagnosis not present

## 2016-02-08 NOTE — Telephone Encounter (Signed)
None

## 2016-02-09 DIAGNOSIS — J4540 Moderate persistent asthma, uncomplicated: Secondary | ICD-10-CM | POA: Diagnosis not present

## 2016-02-10 DIAGNOSIS — J4540 Moderate persistent asthma, uncomplicated: Secondary | ICD-10-CM | POA: Diagnosis not present

## 2016-02-14 DIAGNOSIS — J4540 Moderate persistent asthma, uncomplicated: Secondary | ICD-10-CM | POA: Diagnosis not present

## 2016-02-16 DIAGNOSIS — J4540 Moderate persistent asthma, uncomplicated: Secondary | ICD-10-CM | POA: Diagnosis not present

## 2016-02-17 DIAGNOSIS — J4540 Moderate persistent asthma, uncomplicated: Secondary | ICD-10-CM | POA: Diagnosis not present

## 2016-02-21 DIAGNOSIS — J4540 Moderate persistent asthma, uncomplicated: Secondary | ICD-10-CM | POA: Diagnosis not present

## 2016-02-22 DIAGNOSIS — I2510 Atherosclerotic heart disease of native coronary artery without angina pectoris: Secondary | ICD-10-CM | POA: Diagnosis not present

## 2016-02-22 DIAGNOSIS — E668 Other obesity: Secondary | ICD-10-CM | POA: Diagnosis not present

## 2016-02-22 DIAGNOSIS — I119 Hypertensive heart disease without heart failure: Secondary | ICD-10-CM | POA: Diagnosis not present

## 2016-02-22 DIAGNOSIS — N183 Chronic kidney disease, stage 3 (moderate): Secondary | ICD-10-CM | POA: Diagnosis not present

## 2016-02-22 DIAGNOSIS — Z7901 Long term (current) use of anticoagulants: Secondary | ICD-10-CM | POA: Diagnosis not present

## 2016-02-22 DIAGNOSIS — I5022 Chronic systolic (congestive) heart failure: Secondary | ICD-10-CM | POA: Diagnosis not present

## 2016-02-22 DIAGNOSIS — I480 Paroxysmal atrial fibrillation: Secondary | ICD-10-CM | POA: Diagnosis not present

## 2016-02-22 DIAGNOSIS — E784 Other hyperlipidemia: Secondary | ICD-10-CM | POA: Diagnosis not present

## 2016-02-22 DIAGNOSIS — I350 Nonrheumatic aortic (valve) stenosis: Secondary | ICD-10-CM | POA: Diagnosis not present

## 2016-03-01 DIAGNOSIS — J4540 Moderate persistent asthma, uncomplicated: Secondary | ICD-10-CM | POA: Diagnosis not present

## 2016-03-02 DIAGNOSIS — J4540 Moderate persistent asthma, uncomplicated: Secondary | ICD-10-CM | POA: Diagnosis not present

## 2016-03-06 DIAGNOSIS — J4540 Moderate persistent asthma, uncomplicated: Secondary | ICD-10-CM | POA: Diagnosis not present

## 2016-03-08 DIAGNOSIS — J4540 Moderate persistent asthma, uncomplicated: Secondary | ICD-10-CM | POA: Diagnosis not present

## 2016-03-09 DIAGNOSIS — J4540 Moderate persistent asthma, uncomplicated: Secondary | ICD-10-CM | POA: Diagnosis not present

## 2016-03-13 DIAGNOSIS — J4540 Moderate persistent asthma, uncomplicated: Secondary | ICD-10-CM | POA: Diagnosis not present

## 2016-03-14 DIAGNOSIS — I4891 Unspecified atrial fibrillation: Secondary | ICD-10-CM | POA: Diagnosis not present

## 2016-03-14 DIAGNOSIS — N183 Chronic kidney disease, stage 3 (moderate): Secondary | ICD-10-CM | POA: Diagnosis not present

## 2016-03-14 DIAGNOSIS — K219 Gastro-esophageal reflux disease without esophagitis: Secondary | ICD-10-CM | POA: Diagnosis not present

## 2016-03-14 DIAGNOSIS — R05 Cough: Secondary | ICD-10-CM | POA: Diagnosis not present

## 2016-03-14 DIAGNOSIS — Z86711 Personal history of pulmonary embolism: Secondary | ICD-10-CM | POA: Diagnosis not present

## 2016-03-14 DIAGNOSIS — Z7901 Long term (current) use of anticoagulants: Secondary | ICD-10-CM | POA: Diagnosis not present

## 2016-03-14 DIAGNOSIS — R600 Localized edema: Secondary | ICD-10-CM | POA: Diagnosis not present

## 2016-03-14 DIAGNOSIS — E7800 Pure hypercholesterolemia, unspecified: Secondary | ICD-10-CM | POA: Diagnosis not present

## 2016-03-14 DIAGNOSIS — I10 Essential (primary) hypertension: Secondary | ICD-10-CM | POA: Diagnosis not present

## 2016-03-14 DIAGNOSIS — G4730 Sleep apnea, unspecified: Secondary | ICD-10-CM | POA: Diagnosis not present

## 2016-03-15 DIAGNOSIS — J4540 Moderate persistent asthma, uncomplicated: Secondary | ICD-10-CM | POA: Diagnosis not present

## 2016-03-16 DIAGNOSIS — J4540 Moderate persistent asthma, uncomplicated: Secondary | ICD-10-CM | POA: Diagnosis not present

## 2016-03-19 DIAGNOSIS — M19012 Primary osteoarthritis, left shoulder: Secondary | ICD-10-CM | POA: Diagnosis not present

## 2016-03-19 DIAGNOSIS — M25512 Pain in left shoulder: Secondary | ICD-10-CM | POA: Diagnosis not present

## 2016-03-20 DIAGNOSIS — J4540 Moderate persistent asthma, uncomplicated: Secondary | ICD-10-CM | POA: Diagnosis not present

## 2016-03-22 DIAGNOSIS — J4540 Moderate persistent asthma, uncomplicated: Secondary | ICD-10-CM | POA: Diagnosis not present

## 2016-03-23 DIAGNOSIS — I5022 Chronic systolic (congestive) heart failure: Secondary | ICD-10-CM | POA: Diagnosis not present

## 2016-03-23 DIAGNOSIS — I480 Paroxysmal atrial fibrillation: Secondary | ICD-10-CM | POA: Diagnosis not present

## 2016-03-23 DIAGNOSIS — E784 Other hyperlipidemia: Secondary | ICD-10-CM | POA: Diagnosis not present

## 2016-03-23 DIAGNOSIS — J4540 Moderate persistent asthma, uncomplicated: Secondary | ICD-10-CM | POA: Diagnosis not present

## 2016-03-23 DIAGNOSIS — I350 Nonrheumatic aortic (valve) stenosis: Secondary | ICD-10-CM | POA: Diagnosis not present

## 2016-03-23 DIAGNOSIS — Z7901 Long term (current) use of anticoagulants: Secondary | ICD-10-CM | POA: Diagnosis not present

## 2016-03-23 DIAGNOSIS — E668 Other obesity: Secondary | ICD-10-CM | POA: Diagnosis not present

## 2016-03-23 DIAGNOSIS — I2510 Atherosclerotic heart disease of native coronary artery without angina pectoris: Secondary | ICD-10-CM | POA: Diagnosis not present

## 2016-03-23 DIAGNOSIS — I119 Hypertensive heart disease without heart failure: Secondary | ICD-10-CM | POA: Diagnosis not present

## 2016-03-23 DIAGNOSIS — N183 Chronic kidney disease, stage 3 (moderate): Secondary | ICD-10-CM | POA: Diagnosis not present

## 2016-03-27 DIAGNOSIS — J4540 Moderate persistent asthma, uncomplicated: Secondary | ICD-10-CM | POA: Diagnosis not present

## 2016-03-28 ENCOUNTER — Other Ambulatory Visit: Payer: Self-pay | Admitting: Orthopedic Surgery

## 2016-03-28 DIAGNOSIS — M19012 Primary osteoarthritis, left shoulder: Secondary | ICD-10-CM | POA: Diagnosis not present

## 2016-03-28 DIAGNOSIS — M25512 Pain in left shoulder: Secondary | ICD-10-CM

## 2016-03-29 DIAGNOSIS — N183 Chronic kidney disease, stage 3 (moderate): Secondary | ICD-10-CM | POA: Diagnosis not present

## 2016-03-29 DIAGNOSIS — I119 Hypertensive heart disease without heart failure: Secondary | ICD-10-CM | POA: Diagnosis not present

## 2016-03-29 DIAGNOSIS — I5022 Chronic systolic (congestive) heart failure: Secondary | ICD-10-CM | POA: Diagnosis not present

## 2016-03-29 DIAGNOSIS — J4540 Moderate persistent asthma, uncomplicated: Secondary | ICD-10-CM | POA: Diagnosis not present

## 2016-03-29 DIAGNOSIS — E784 Other hyperlipidemia: Secondary | ICD-10-CM | POA: Diagnosis not present

## 2016-03-29 DIAGNOSIS — E668 Other obesity: Secondary | ICD-10-CM | POA: Diagnosis not present

## 2016-03-29 DIAGNOSIS — I480 Paroxysmal atrial fibrillation: Secondary | ICD-10-CM | POA: Diagnosis not present

## 2016-03-29 DIAGNOSIS — I350 Nonrheumatic aortic (valve) stenosis: Secondary | ICD-10-CM | POA: Diagnosis not present

## 2016-03-29 DIAGNOSIS — I2510 Atherosclerotic heart disease of native coronary artery without angina pectoris: Secondary | ICD-10-CM | POA: Diagnosis not present

## 2016-03-29 DIAGNOSIS — Z7901 Long term (current) use of anticoagulants: Secondary | ICD-10-CM | POA: Diagnosis not present

## 2016-03-30 DIAGNOSIS — J4540 Moderate persistent asthma, uncomplicated: Secondary | ICD-10-CM | POA: Diagnosis not present

## 2016-04-03 DIAGNOSIS — J4540 Moderate persistent asthma, uncomplicated: Secondary | ICD-10-CM | POA: Diagnosis not present

## 2016-04-05 DIAGNOSIS — J4540 Moderate persistent asthma, uncomplicated: Secondary | ICD-10-CM | POA: Diagnosis not present

## 2016-04-06 DIAGNOSIS — J4540 Moderate persistent asthma, uncomplicated: Secondary | ICD-10-CM | POA: Diagnosis not present

## 2016-04-10 DIAGNOSIS — J4540 Moderate persistent asthma, uncomplicated: Secondary | ICD-10-CM | POA: Diagnosis not present

## 2016-04-11 ENCOUNTER — Ambulatory Visit
Admission: RE | Admit: 2016-04-11 | Discharge: 2016-04-11 | Disposition: A | Discharge status: Home / Self Care | Payer: Medicare Other | Source: Ambulatory Visit | Attending: Orthopedic Surgery | Admitting: Orthopedic Surgery

## 2016-04-11 DIAGNOSIS — M25512 Pain in left shoulder: Secondary | ICD-10-CM

## 2016-04-12 DIAGNOSIS — E668 Other obesity: Secondary | ICD-10-CM | POA: Diagnosis not present

## 2016-04-12 DIAGNOSIS — E784 Other hyperlipidemia: Secondary | ICD-10-CM | POA: Diagnosis not present

## 2016-04-12 DIAGNOSIS — I480 Paroxysmal atrial fibrillation: Secondary | ICD-10-CM | POA: Diagnosis not present

## 2016-04-12 DIAGNOSIS — I119 Hypertensive heart disease without heart failure: Secondary | ICD-10-CM | POA: Diagnosis not present

## 2016-04-12 DIAGNOSIS — N183 Chronic kidney disease, stage 3 (moderate): Secondary | ICD-10-CM | POA: Diagnosis not present

## 2016-04-12 DIAGNOSIS — J4540 Moderate persistent asthma, uncomplicated: Secondary | ICD-10-CM | POA: Diagnosis not present

## 2016-04-12 DIAGNOSIS — I5022 Chronic systolic (congestive) heart failure: Secondary | ICD-10-CM | POA: Diagnosis not present

## 2016-04-12 DIAGNOSIS — I350 Nonrheumatic aortic (valve) stenosis: Secondary | ICD-10-CM | POA: Diagnosis not present

## 2016-04-12 DIAGNOSIS — I2510 Atherosclerotic heart disease of native coronary artery without angina pectoris: Secondary | ICD-10-CM | POA: Diagnosis not present

## 2016-04-12 DIAGNOSIS — Z7901 Long term (current) use of anticoagulants: Secondary | ICD-10-CM | POA: Diagnosis not present

## 2016-04-13 DIAGNOSIS — J4540 Moderate persistent asthma, uncomplicated: Secondary | ICD-10-CM | POA: Diagnosis not present

## 2016-04-18 DIAGNOSIS — M19012 Primary osteoarthritis, left shoulder: Secondary | ICD-10-CM | POA: Diagnosis not present

## 2016-04-20 DIAGNOSIS — J4540 Moderate persistent asthma, uncomplicated: Secondary | ICD-10-CM | POA: Diagnosis not present

## 2016-04-24 DIAGNOSIS — J4540 Moderate persistent asthma, uncomplicated: Secondary | ICD-10-CM | POA: Diagnosis not present

## 2016-04-26 DIAGNOSIS — J4540 Moderate persistent asthma, uncomplicated: Secondary | ICD-10-CM | POA: Diagnosis not present

## 2016-04-27 DIAGNOSIS — J4540 Moderate persistent asthma, uncomplicated: Secondary | ICD-10-CM | POA: Diagnosis not present

## 2016-05-01 DIAGNOSIS — J4540 Moderate persistent asthma, uncomplicated: Secondary | ICD-10-CM | POA: Diagnosis not present

## 2016-05-03 DIAGNOSIS — J4540 Moderate persistent asthma, uncomplicated: Secondary | ICD-10-CM | POA: Diagnosis not present

## 2016-05-04 DIAGNOSIS — J4540 Moderate persistent asthma, uncomplicated: Secondary | ICD-10-CM | POA: Diagnosis not present

## 2016-05-08 DIAGNOSIS — J4540 Moderate persistent asthma, uncomplicated: Secondary | ICD-10-CM | POA: Diagnosis not present

## 2016-05-10 DIAGNOSIS — J4540 Moderate persistent asthma, uncomplicated: Secondary | ICD-10-CM | POA: Diagnosis not present

## 2016-05-16 DIAGNOSIS — E668 Other obesity: Secondary | ICD-10-CM | POA: Diagnosis not present

## 2016-05-16 DIAGNOSIS — I5022 Chronic systolic (congestive) heart failure: Secondary | ICD-10-CM | POA: Diagnosis not present

## 2016-05-16 DIAGNOSIS — R946 Abnormal results of thyroid function studies: Secondary | ICD-10-CM | POA: Diagnosis not present

## 2016-05-16 DIAGNOSIS — I480 Paroxysmal atrial fibrillation: Secondary | ICD-10-CM | POA: Diagnosis not present

## 2016-05-16 DIAGNOSIS — I2510 Atherosclerotic heart disease of native coronary artery without angina pectoris: Secondary | ICD-10-CM | POA: Diagnosis not present

## 2016-05-16 DIAGNOSIS — Z955 Presence of coronary angioplasty implant and graft: Secondary | ICD-10-CM | POA: Diagnosis not present

## 2016-05-16 DIAGNOSIS — N183 Chronic kidney disease, stage 3 (moderate): Secondary | ICD-10-CM | POA: Diagnosis not present

## 2016-05-16 DIAGNOSIS — Z8546 Personal history of malignant neoplasm of prostate: Secondary | ICD-10-CM | POA: Diagnosis not present

## 2016-05-16 DIAGNOSIS — I119 Hypertensive heart disease without heart failure: Secondary | ICD-10-CM | POA: Diagnosis not present

## 2016-05-16 DIAGNOSIS — Z7901 Long term (current) use of anticoagulants: Secondary | ICD-10-CM | POA: Diagnosis not present

## 2016-05-16 DIAGNOSIS — I350 Nonrheumatic aortic (valve) stenosis: Secondary | ICD-10-CM | POA: Diagnosis not present

## 2016-05-16 DIAGNOSIS — E784 Other hyperlipidemia: Secondary | ICD-10-CM | POA: Diagnosis not present

## 2016-05-16 DIAGNOSIS — E0590 Thyrotoxicosis, unspecified without thyrotoxic crisis or storm: Secondary | ICD-10-CM | POA: Diagnosis not present

## 2016-05-16 DIAGNOSIS — Z86718 Personal history of other venous thrombosis and embolism: Secondary | ICD-10-CM | POA: Diagnosis not present

## 2016-05-24 ENCOUNTER — Other Ambulatory Visit (HOSPITAL_COMMUNITY): Payer: Self-pay | Admitting: Family Medicine

## 2016-05-24 DIAGNOSIS — M5417 Radiculopathy, lumbosacral region: Secondary | ICD-10-CM | POA: Diagnosis not present

## 2016-05-24 DIAGNOSIS — E0590 Thyrotoxicosis, unspecified without thyrotoxic crisis or storm: Secondary | ICD-10-CM

## 2016-05-24 DIAGNOSIS — M9903 Segmental and somatic dysfunction of lumbar region: Secondary | ICD-10-CM | POA: Diagnosis not present

## 2016-05-24 DIAGNOSIS — M5137 Other intervertebral disc degeneration, lumbosacral region: Secondary | ICD-10-CM | POA: Diagnosis not present

## 2016-05-24 DIAGNOSIS — M545 Low back pain: Secondary | ICD-10-CM | POA: Diagnosis not present

## 2016-05-24 DIAGNOSIS — M62830 Muscle spasm of back: Secondary | ICD-10-CM | POA: Diagnosis not present

## 2016-06-01 DIAGNOSIS — M545 Low back pain: Secondary | ICD-10-CM | POA: Diagnosis not present

## 2016-06-01 DIAGNOSIS — M5417 Radiculopathy, lumbosacral region: Secondary | ICD-10-CM | POA: Diagnosis not present

## 2016-06-01 DIAGNOSIS — M9903 Segmental and somatic dysfunction of lumbar region: Secondary | ICD-10-CM | POA: Diagnosis not present

## 2016-06-01 DIAGNOSIS — M5137 Other intervertebral disc degeneration, lumbosacral region: Secondary | ICD-10-CM | POA: Diagnosis not present

## 2016-06-01 DIAGNOSIS — M62830 Muscle spasm of back: Secondary | ICD-10-CM | POA: Diagnosis not present

## 2016-06-06 ENCOUNTER — Encounter (HOSPITAL_COMMUNITY)
Admission: RE | Admit: 2016-06-06 | Discharge: 2016-06-06 | Disposition: A | Discharge status: Home / Self Care | Payer: Medicare Other | Source: Ambulatory Visit | Attending: Family Medicine | Admitting: Family Medicine

## 2016-06-06 DIAGNOSIS — E0590 Thyrotoxicosis, unspecified without thyrotoxic crisis or storm: Secondary | ICD-10-CM | POA: Insufficient documentation

## 2016-06-06 MED ORDER — SODIUM IODIDE I 131 CAPSULE
12.4000 | Freq: Once | INTRAVENOUS | Status: AC | PRN
  Administered 2016-06-06: 12.4 via ORAL

## 2016-06-07 ENCOUNTER — Encounter (HOSPITAL_COMMUNITY)
Admission: RE | Admit: 2016-06-07 | Discharge: 2016-06-07 | Disposition: A | Discharge status: Home / Self Care | Payer: Medicare Other | Source: Ambulatory Visit | Attending: Family Medicine | Admitting: Family Medicine

## 2016-06-07 DIAGNOSIS — E0590 Thyrotoxicosis, unspecified without thyrotoxic crisis or storm: Secondary | ICD-10-CM | POA: Diagnosis not present

## 2016-06-07 MED ORDER — SODIUM PERTECHNETATE TC 99M INJECTION
10.0000 | Freq: Once | INTRAVENOUS | Status: AC | PRN
  Administered 2016-06-07: 10 via INTRAVENOUS

## 2016-06-08 DIAGNOSIS — M62830 Muscle spasm of back: Secondary | ICD-10-CM | POA: Diagnosis not present

## 2016-06-08 DIAGNOSIS — M545 Low back pain: Secondary | ICD-10-CM | POA: Diagnosis not present

## 2016-06-08 DIAGNOSIS — M5417 Radiculopathy, lumbosacral region: Secondary | ICD-10-CM | POA: Diagnosis not present

## 2016-06-08 DIAGNOSIS — M5137 Other intervertebral disc degeneration, lumbosacral region: Secondary | ICD-10-CM | POA: Diagnosis not present

## 2016-06-08 DIAGNOSIS — M9903 Segmental and somatic dysfunction of lumbar region: Secondary | ICD-10-CM | POA: Diagnosis not present

## 2016-06-14 DIAGNOSIS — N183 Chronic kidney disease, stage 3 (moderate): Secondary | ICD-10-CM | POA: Diagnosis not present

## 2016-06-14 DIAGNOSIS — I350 Nonrheumatic aortic (valve) stenosis: Secondary | ICD-10-CM | POA: Diagnosis not present

## 2016-06-14 DIAGNOSIS — E784 Other hyperlipidemia: Secondary | ICD-10-CM | POA: Diagnosis not present

## 2016-06-14 DIAGNOSIS — Z955 Presence of coronary angioplasty implant and graft: Secondary | ICD-10-CM | POA: Diagnosis not present

## 2016-06-14 DIAGNOSIS — I2510 Atherosclerotic heart disease of native coronary artery without angina pectoris: Secondary | ICD-10-CM | POA: Diagnosis not present

## 2016-06-14 DIAGNOSIS — Z8546 Personal history of malignant neoplasm of prostate: Secondary | ICD-10-CM | POA: Diagnosis not present

## 2016-06-14 DIAGNOSIS — I429 Cardiomyopathy, unspecified: Secondary | ICD-10-CM | POA: Diagnosis not present

## 2016-06-14 DIAGNOSIS — Z7901 Long term (current) use of anticoagulants: Secondary | ICD-10-CM | POA: Diagnosis not present

## 2016-06-14 DIAGNOSIS — I5022 Chronic systolic (congestive) heart failure: Secondary | ICD-10-CM | POA: Diagnosis not present

## 2016-06-14 DIAGNOSIS — I119 Hypertensive heart disease without heart failure: Secondary | ICD-10-CM | POA: Diagnosis not present

## 2016-06-14 DIAGNOSIS — E668 Other obesity: Secondary | ICD-10-CM | POA: Diagnosis not present

## 2016-06-14 DIAGNOSIS — I480 Paroxysmal atrial fibrillation: Secondary | ICD-10-CM | POA: Diagnosis not present

## 2016-06-15 DIAGNOSIS — M9903 Segmental and somatic dysfunction of lumbar region: Secondary | ICD-10-CM | POA: Diagnosis not present

## 2016-06-15 DIAGNOSIS — M5417 Radiculopathy, lumbosacral region: Secondary | ICD-10-CM | POA: Diagnosis not present

## 2016-06-15 DIAGNOSIS — M545 Low back pain: Secondary | ICD-10-CM | POA: Diagnosis not present

## 2016-06-15 DIAGNOSIS — M5137 Other intervertebral disc degeneration, lumbosacral region: Secondary | ICD-10-CM | POA: Diagnosis not present

## 2016-06-15 DIAGNOSIS — M62830 Muscle spasm of back: Secondary | ICD-10-CM | POA: Diagnosis not present

## 2016-06-29 DIAGNOSIS — M545 Low back pain: Secondary | ICD-10-CM | POA: Diagnosis not present

## 2016-06-29 DIAGNOSIS — M5137 Other intervertebral disc degeneration, lumbosacral region: Secondary | ICD-10-CM | POA: Diagnosis not present

## 2016-06-29 DIAGNOSIS — M62830 Muscle spasm of back: Secondary | ICD-10-CM | POA: Diagnosis not present

## 2016-06-29 DIAGNOSIS — M5417 Radiculopathy, lumbosacral region: Secondary | ICD-10-CM | POA: Diagnosis not present

## 2016-06-29 DIAGNOSIS — M9903 Segmental and somatic dysfunction of lumbar region: Secondary | ICD-10-CM | POA: Diagnosis not present

## 2016-06-30 DIAGNOSIS — E0590 Thyrotoxicosis, unspecified without thyrotoxic crisis or storm: Secondary | ICD-10-CM | POA: Diagnosis not present

## 2016-07-07 DIAGNOSIS — I350 Nonrheumatic aortic (valve) stenosis: Secondary | ICD-10-CM | POA: Diagnosis not present

## 2016-07-07 DIAGNOSIS — Z955 Presence of coronary angioplasty implant and graft: Secondary | ICD-10-CM | POA: Diagnosis not present

## 2016-07-07 DIAGNOSIS — I429 Cardiomyopathy, unspecified: Secondary | ICD-10-CM | POA: Diagnosis not present

## 2016-07-07 DIAGNOSIS — Z8546 Personal history of malignant neoplasm of prostate: Secondary | ICD-10-CM | POA: Diagnosis not present

## 2016-07-07 DIAGNOSIS — I119 Hypertensive heart disease without heart failure: Secondary | ICD-10-CM | POA: Diagnosis not present

## 2016-07-07 DIAGNOSIS — I5022 Chronic systolic (congestive) heart failure: Secondary | ICD-10-CM | POA: Diagnosis not present

## 2016-07-07 DIAGNOSIS — I480 Paroxysmal atrial fibrillation: Secondary | ICD-10-CM | POA: Diagnosis not present

## 2016-07-07 DIAGNOSIS — E784 Other hyperlipidemia: Secondary | ICD-10-CM | POA: Diagnosis not present

## 2016-07-07 DIAGNOSIS — E668 Other obesity: Secondary | ICD-10-CM | POA: Diagnosis not present

## 2016-07-07 DIAGNOSIS — N183 Chronic kidney disease, stage 3 (moderate): Secondary | ICD-10-CM | POA: Diagnosis not present

## 2016-07-07 DIAGNOSIS — Z7901 Long term (current) use of anticoagulants: Secondary | ICD-10-CM | POA: Diagnosis not present

## 2016-07-07 DIAGNOSIS — I2510 Atherosclerotic heart disease of native coronary artery without angina pectoris: Secondary | ICD-10-CM | POA: Diagnosis not present

## 2016-07-12 DIAGNOSIS — M5417 Radiculopathy, lumbosacral region: Secondary | ICD-10-CM | POA: Diagnosis not present

## 2016-07-12 DIAGNOSIS — I480 Paroxysmal atrial fibrillation: Secondary | ICD-10-CM | POA: Diagnosis not present

## 2016-07-12 DIAGNOSIS — M9903 Segmental and somatic dysfunction of lumbar region: Secondary | ICD-10-CM | POA: Diagnosis not present

## 2016-07-12 DIAGNOSIS — M62830 Muscle spasm of back: Secondary | ICD-10-CM | POA: Diagnosis not present

## 2016-07-12 DIAGNOSIS — Z955 Presence of coronary angioplasty implant and graft: Secondary | ICD-10-CM | POA: Diagnosis not present

## 2016-07-12 DIAGNOSIS — M545 Low back pain: Secondary | ICD-10-CM | POA: Diagnosis not present

## 2016-07-12 DIAGNOSIS — E668 Other obesity: Secondary | ICD-10-CM | POA: Diagnosis not present

## 2016-07-12 DIAGNOSIS — N183 Chronic kidney disease, stage 3 (moderate): Secondary | ICD-10-CM | POA: Diagnosis not present

## 2016-07-12 DIAGNOSIS — M5137 Other intervertebral disc degeneration, lumbosacral region: Secondary | ICD-10-CM | POA: Diagnosis not present

## 2016-07-12 DIAGNOSIS — I119 Hypertensive heart disease without heart failure: Secondary | ICD-10-CM | POA: Diagnosis not present

## 2016-07-12 DIAGNOSIS — I429 Cardiomyopathy, unspecified: Secondary | ICD-10-CM | POA: Diagnosis not present

## 2016-07-12 DIAGNOSIS — I5022 Chronic systolic (congestive) heart failure: Secondary | ICD-10-CM | POA: Diagnosis not present

## 2016-07-12 DIAGNOSIS — Z8546 Personal history of malignant neoplasm of prostate: Secondary | ICD-10-CM | POA: Diagnosis not present

## 2016-07-12 DIAGNOSIS — E784 Other hyperlipidemia: Secondary | ICD-10-CM | POA: Diagnosis not present

## 2016-07-12 DIAGNOSIS — Z7901 Long term (current) use of anticoagulants: Secondary | ICD-10-CM | POA: Diagnosis not present

## 2016-07-12 DIAGNOSIS — I350 Nonrheumatic aortic (valve) stenosis: Secondary | ICD-10-CM | POA: Diagnosis not present

## 2016-07-12 DIAGNOSIS — I2510 Atherosclerotic heart disease of native coronary artery without angina pectoris: Secondary | ICD-10-CM | POA: Diagnosis not present

## 2016-07-21 DIAGNOSIS — Z955 Presence of coronary angioplasty implant and graft: Secondary | ICD-10-CM | POA: Diagnosis not present

## 2016-07-21 DIAGNOSIS — N183 Chronic kidney disease, stage 3 (moderate): Secondary | ICD-10-CM | POA: Diagnosis not present

## 2016-07-21 DIAGNOSIS — I5022 Chronic systolic (congestive) heart failure: Secondary | ICD-10-CM | POA: Diagnosis not present

## 2016-07-21 DIAGNOSIS — I429 Cardiomyopathy, unspecified: Secondary | ICD-10-CM | POA: Diagnosis not present

## 2016-07-21 DIAGNOSIS — I119 Hypertensive heart disease without heart failure: Secondary | ICD-10-CM | POA: Diagnosis not present

## 2016-07-21 DIAGNOSIS — E668 Other obesity: Secondary | ICD-10-CM | POA: Diagnosis not present

## 2016-07-21 DIAGNOSIS — Z8546 Personal history of malignant neoplasm of prostate: Secondary | ICD-10-CM | POA: Diagnosis not present

## 2016-07-21 DIAGNOSIS — I2510 Atherosclerotic heart disease of native coronary artery without angina pectoris: Secondary | ICD-10-CM | POA: Diagnosis not present

## 2016-07-21 DIAGNOSIS — E784 Other hyperlipidemia: Secondary | ICD-10-CM | POA: Diagnosis not present

## 2016-07-21 DIAGNOSIS — I480 Paroxysmal atrial fibrillation: Secondary | ICD-10-CM | POA: Diagnosis not present

## 2016-07-21 DIAGNOSIS — I350 Nonrheumatic aortic (valve) stenosis: Secondary | ICD-10-CM | POA: Diagnosis not present

## 2016-07-21 DIAGNOSIS — Z7901 Long term (current) use of anticoagulants: Secondary | ICD-10-CM | POA: Diagnosis not present

## 2016-08-21 DIAGNOSIS — Z7901 Long term (current) use of anticoagulants: Secondary | ICD-10-CM | POA: Diagnosis not present

## 2016-08-21 DIAGNOSIS — E784 Other hyperlipidemia: Secondary | ICD-10-CM | POA: Diagnosis not present

## 2016-08-21 DIAGNOSIS — I119 Hypertensive heart disease without heart failure: Secondary | ICD-10-CM | POA: Diagnosis not present

## 2016-08-21 DIAGNOSIS — Z8546 Personal history of malignant neoplasm of prostate: Secondary | ICD-10-CM | POA: Diagnosis not present

## 2016-08-21 DIAGNOSIS — Z955 Presence of coronary angioplasty implant and graft: Secondary | ICD-10-CM | POA: Diagnosis not present

## 2016-08-21 DIAGNOSIS — I480 Paroxysmal atrial fibrillation: Secondary | ICD-10-CM | POA: Diagnosis not present

## 2016-08-21 DIAGNOSIS — E668 Other obesity: Secondary | ICD-10-CM | POA: Diagnosis not present

## 2016-08-21 DIAGNOSIS — N183 Chronic kidney disease, stage 3 (moderate): Secondary | ICD-10-CM | POA: Diagnosis not present

## 2016-08-21 DIAGNOSIS — I2510 Atherosclerotic heart disease of native coronary artery without angina pectoris: Secondary | ICD-10-CM | POA: Diagnosis not present

## 2016-08-21 DIAGNOSIS — I429 Cardiomyopathy, unspecified: Secondary | ICD-10-CM | POA: Diagnosis not present

## 2016-08-21 DIAGNOSIS — I5022 Chronic systolic (congestive) heart failure: Secondary | ICD-10-CM | POA: Diagnosis not present

## 2016-08-21 DIAGNOSIS — I350 Nonrheumatic aortic (valve) stenosis: Secondary | ICD-10-CM | POA: Diagnosis not present

## 2016-09-19 DIAGNOSIS — I4891 Unspecified atrial fibrillation: Secondary | ICD-10-CM | POA: Diagnosis not present

## 2016-09-19 DIAGNOSIS — G4730 Sleep apnea, unspecified: Secondary | ICD-10-CM | POA: Diagnosis not present

## 2016-09-19 DIAGNOSIS — R600 Localized edema: Secondary | ICD-10-CM | POA: Diagnosis not present

## 2016-09-19 DIAGNOSIS — N183 Chronic kidney disease, stage 3 (moderate): Secondary | ICD-10-CM | POA: Diagnosis not present

## 2016-09-19 DIAGNOSIS — E7800 Pure hypercholesterolemia, unspecified: Secondary | ICD-10-CM | POA: Diagnosis not present

## 2016-09-19 DIAGNOSIS — Z8546 Personal history of malignant neoplasm of prostate: Secondary | ICD-10-CM | POA: Diagnosis not present

## 2016-09-19 DIAGNOSIS — I10 Essential (primary) hypertension: Secondary | ICD-10-CM | POA: Diagnosis not present

## 2016-09-19 DIAGNOSIS — E6609 Other obesity due to excess calories: Secondary | ICD-10-CM | POA: Diagnosis not present

## 2016-09-19 DIAGNOSIS — Z6832 Body mass index (BMI) 32.0-32.9, adult: Secondary | ICD-10-CM | POA: Diagnosis not present

## 2016-09-21 DIAGNOSIS — N183 Chronic kidney disease, stage 3 (moderate): Secondary | ICD-10-CM | POA: Diagnosis not present

## 2016-09-21 DIAGNOSIS — Z955 Presence of coronary angioplasty implant and graft: Secondary | ICD-10-CM | POA: Diagnosis not present

## 2016-09-21 DIAGNOSIS — Z8546 Personal history of malignant neoplasm of prostate: Secondary | ICD-10-CM | POA: Diagnosis not present

## 2016-09-21 DIAGNOSIS — I350 Nonrheumatic aortic (valve) stenosis: Secondary | ICD-10-CM | POA: Diagnosis not present

## 2016-09-21 DIAGNOSIS — I5022 Chronic systolic (congestive) heart failure: Secondary | ICD-10-CM | POA: Diagnosis not present

## 2016-09-21 DIAGNOSIS — I2510 Atherosclerotic heart disease of native coronary artery without angina pectoris: Secondary | ICD-10-CM | POA: Diagnosis not present

## 2016-09-21 DIAGNOSIS — E668 Other obesity: Secondary | ICD-10-CM | POA: Diagnosis not present

## 2016-09-21 DIAGNOSIS — I429 Cardiomyopathy, unspecified: Secondary | ICD-10-CM | POA: Diagnosis not present

## 2016-09-21 DIAGNOSIS — E784 Other hyperlipidemia: Secondary | ICD-10-CM | POA: Diagnosis not present

## 2016-09-21 DIAGNOSIS — I480 Paroxysmal atrial fibrillation: Secondary | ICD-10-CM | POA: Diagnosis not present

## 2016-09-21 DIAGNOSIS — Z7901 Long term (current) use of anticoagulants: Secondary | ICD-10-CM | POA: Diagnosis not present

## 2016-09-21 DIAGNOSIS — I119 Hypertensive heart disease without heart failure: Secondary | ICD-10-CM | POA: Diagnosis not present

## 2016-10-05 DIAGNOSIS — N183 Chronic kidney disease, stage 3 (moderate): Secondary | ICD-10-CM | POA: Diagnosis not present

## 2016-10-05 DIAGNOSIS — I350 Nonrheumatic aortic (valve) stenosis: Secondary | ICD-10-CM | POA: Diagnosis not present

## 2016-10-05 DIAGNOSIS — I119 Hypertensive heart disease without heart failure: Secondary | ICD-10-CM | POA: Diagnosis not present

## 2016-10-05 DIAGNOSIS — I429 Cardiomyopathy, unspecified: Secondary | ICD-10-CM | POA: Diagnosis not present

## 2016-10-05 DIAGNOSIS — Z7901 Long term (current) use of anticoagulants: Secondary | ICD-10-CM | POA: Diagnosis not present

## 2016-10-05 DIAGNOSIS — I5022 Chronic systolic (congestive) heart failure: Secondary | ICD-10-CM | POA: Diagnosis not present

## 2016-10-05 DIAGNOSIS — Z955 Presence of coronary angioplasty implant and graft: Secondary | ICD-10-CM | POA: Diagnosis not present

## 2016-10-05 DIAGNOSIS — Z8546 Personal history of malignant neoplasm of prostate: Secondary | ICD-10-CM | POA: Diagnosis not present

## 2016-10-05 DIAGNOSIS — E784 Other hyperlipidemia: Secondary | ICD-10-CM | POA: Diagnosis not present

## 2016-10-05 DIAGNOSIS — I2510 Atherosclerotic heart disease of native coronary artery without angina pectoris: Secondary | ICD-10-CM | POA: Diagnosis not present

## 2016-10-05 DIAGNOSIS — I480 Paroxysmal atrial fibrillation: Secondary | ICD-10-CM | POA: Diagnosis not present

## 2016-10-05 DIAGNOSIS — E668 Other obesity: Secondary | ICD-10-CM | POA: Diagnosis not present

## 2016-11-02 DIAGNOSIS — E668 Other obesity: Secondary | ICD-10-CM | POA: Diagnosis not present

## 2016-11-02 DIAGNOSIS — N183 Chronic kidney disease, stage 3 (moderate): Secondary | ICD-10-CM | POA: Diagnosis not present

## 2016-11-02 DIAGNOSIS — I5022 Chronic systolic (congestive) heart failure: Secondary | ICD-10-CM | POA: Diagnosis not present

## 2016-11-02 DIAGNOSIS — I350 Nonrheumatic aortic (valve) stenosis: Secondary | ICD-10-CM | POA: Diagnosis not present

## 2016-11-02 DIAGNOSIS — Z8546 Personal history of malignant neoplasm of prostate: Secondary | ICD-10-CM | POA: Diagnosis not present

## 2016-11-02 DIAGNOSIS — Z955 Presence of coronary angioplasty implant and graft: Secondary | ICD-10-CM | POA: Diagnosis not present

## 2016-11-02 DIAGNOSIS — I2510 Atherosclerotic heart disease of native coronary artery without angina pectoris: Secondary | ICD-10-CM | POA: Diagnosis not present

## 2016-11-02 DIAGNOSIS — I429 Cardiomyopathy, unspecified: Secondary | ICD-10-CM | POA: Diagnosis not present

## 2016-11-02 DIAGNOSIS — E784 Other hyperlipidemia: Secondary | ICD-10-CM | POA: Diagnosis not present

## 2016-11-02 DIAGNOSIS — I480 Paroxysmal atrial fibrillation: Secondary | ICD-10-CM | POA: Diagnosis not present

## 2016-11-02 DIAGNOSIS — I119 Hypertensive heart disease without heart failure: Secondary | ICD-10-CM | POA: Diagnosis not present

## 2016-11-02 DIAGNOSIS — Z7901 Long term (current) use of anticoagulants: Secondary | ICD-10-CM | POA: Diagnosis not present

## 2016-11-06 DIAGNOSIS — Z955 Presence of coronary angioplasty implant and graft: Secondary | ICD-10-CM | POA: Diagnosis not present

## 2016-11-06 DIAGNOSIS — I480 Paroxysmal atrial fibrillation: Secondary | ICD-10-CM | POA: Diagnosis not present

## 2016-11-06 DIAGNOSIS — I119 Hypertensive heart disease without heart failure: Secondary | ICD-10-CM | POA: Diagnosis not present

## 2016-11-06 DIAGNOSIS — E668 Other obesity: Secondary | ICD-10-CM | POA: Diagnosis not present

## 2016-11-06 DIAGNOSIS — I350 Nonrheumatic aortic (valve) stenosis: Secondary | ICD-10-CM | POA: Diagnosis not present

## 2016-11-06 DIAGNOSIS — I429 Cardiomyopathy, unspecified: Secondary | ICD-10-CM | POA: Diagnosis not present

## 2016-11-06 DIAGNOSIS — I359 Nonrheumatic aortic valve disorder, unspecified: Secondary | ICD-10-CM | POA: Diagnosis not present

## 2016-11-06 DIAGNOSIS — N183 Chronic kidney disease, stage 3 (moderate): Secondary | ICD-10-CM | POA: Diagnosis not present

## 2016-11-06 DIAGNOSIS — I2510 Atherosclerotic heart disease of native coronary artery without angina pectoris: Secondary | ICD-10-CM | POA: Diagnosis not present

## 2016-11-06 DIAGNOSIS — I5022 Chronic systolic (congestive) heart failure: Secondary | ICD-10-CM | POA: Diagnosis not present

## 2016-11-06 DIAGNOSIS — Z7901 Long term (current) use of anticoagulants: Secondary | ICD-10-CM | POA: Diagnosis not present

## 2016-11-06 DIAGNOSIS — E7849 Other hyperlipidemia: Secondary | ICD-10-CM | POA: Diagnosis not present

## 2016-11-06 DIAGNOSIS — Z8546 Personal history of malignant neoplasm of prostate: Secondary | ICD-10-CM | POA: Diagnosis not present

## 2016-11-28 DIAGNOSIS — Z7901 Long term (current) use of anticoagulants: Secondary | ICD-10-CM | POA: Diagnosis not present

## 2016-11-28 DIAGNOSIS — I5022 Chronic systolic (congestive) heart failure: Secondary | ICD-10-CM | POA: Diagnosis not present

## 2016-11-28 DIAGNOSIS — Z955 Presence of coronary angioplasty implant and graft: Secondary | ICD-10-CM | POA: Diagnosis not present

## 2016-11-28 DIAGNOSIS — I480 Paroxysmal atrial fibrillation: Secondary | ICD-10-CM | POA: Diagnosis not present

## 2016-11-28 DIAGNOSIS — I429 Cardiomyopathy, unspecified: Secondary | ICD-10-CM | POA: Diagnosis not present

## 2016-11-28 DIAGNOSIS — I2510 Atherosclerotic heart disease of native coronary artery without angina pectoris: Secondary | ICD-10-CM | POA: Diagnosis not present

## 2016-11-28 DIAGNOSIS — Z8546 Personal history of malignant neoplasm of prostate: Secondary | ICD-10-CM | POA: Diagnosis not present

## 2016-11-28 DIAGNOSIS — E7849 Other hyperlipidemia: Secondary | ICD-10-CM | POA: Diagnosis not present

## 2016-11-28 DIAGNOSIS — I119 Hypertensive heart disease without heart failure: Secondary | ICD-10-CM | POA: Diagnosis not present

## 2016-11-28 DIAGNOSIS — I350 Nonrheumatic aortic (valve) stenosis: Secondary | ICD-10-CM | POA: Diagnosis not present

## 2016-11-28 DIAGNOSIS — E668 Other obesity: Secondary | ICD-10-CM | POA: Diagnosis not present

## 2016-11-28 DIAGNOSIS — N183 Chronic kidney disease, stage 3 (moderate): Secondary | ICD-10-CM | POA: Diagnosis not present

## 2016-12-13 DIAGNOSIS — E7849 Other hyperlipidemia: Secondary | ICD-10-CM | POA: Diagnosis not present

## 2016-12-13 DIAGNOSIS — Z955 Presence of coronary angioplasty implant and graft: Secondary | ICD-10-CM | POA: Diagnosis not present

## 2016-12-13 DIAGNOSIS — I350 Nonrheumatic aortic (valve) stenosis: Secondary | ICD-10-CM | POA: Diagnosis not present

## 2016-12-13 DIAGNOSIS — N183 Chronic kidney disease, stage 3 (moderate): Secondary | ICD-10-CM | POA: Diagnosis not present

## 2016-12-13 DIAGNOSIS — I119 Hypertensive heart disease without heart failure: Secondary | ICD-10-CM | POA: Diagnosis not present

## 2016-12-13 DIAGNOSIS — I5022 Chronic systolic (congestive) heart failure: Secondary | ICD-10-CM | POA: Diagnosis not present

## 2016-12-13 DIAGNOSIS — Z7901 Long term (current) use of anticoagulants: Secondary | ICD-10-CM | POA: Diagnosis not present

## 2016-12-13 DIAGNOSIS — E668 Other obesity: Secondary | ICD-10-CM | POA: Diagnosis not present

## 2016-12-13 DIAGNOSIS — I429 Cardiomyopathy, unspecified: Secondary | ICD-10-CM | POA: Diagnosis not present

## 2016-12-13 DIAGNOSIS — Z8546 Personal history of malignant neoplasm of prostate: Secondary | ICD-10-CM | POA: Diagnosis not present

## 2016-12-13 DIAGNOSIS — I2510 Atherosclerotic heart disease of native coronary artery without angina pectoris: Secondary | ICD-10-CM | POA: Diagnosis not present

## 2016-12-13 DIAGNOSIS — I480 Paroxysmal atrial fibrillation: Secondary | ICD-10-CM | POA: Diagnosis not present

## 2016-12-27 DIAGNOSIS — M12811 Other specific arthropathies, not elsewhere classified, right shoulder: Secondary | ICD-10-CM | POA: Diagnosis not present

## 2016-12-27 DIAGNOSIS — M75101 Unspecified rotator cuff tear or rupture of right shoulder, not specified as traumatic: Secondary | ICD-10-CM | POA: Diagnosis not present

## 2017-01-01 DIAGNOSIS — M12811 Other specific arthropathies, not elsewhere classified, right shoulder: Secondary | ICD-10-CM | POA: Diagnosis not present

## 2017-01-01 DIAGNOSIS — M75101 Unspecified rotator cuff tear or rupture of right shoulder, not specified as traumatic: Secondary | ICD-10-CM | POA: Diagnosis not present

## 2017-01-03 DIAGNOSIS — N183 Chronic kidney disease, stage 3 (moderate): Secondary | ICD-10-CM | POA: Diagnosis not present

## 2017-01-03 DIAGNOSIS — Z955 Presence of coronary angioplasty implant and graft: Secondary | ICD-10-CM | POA: Diagnosis not present

## 2017-01-03 DIAGNOSIS — E7849 Other hyperlipidemia: Secondary | ICD-10-CM | POA: Diagnosis not present

## 2017-01-03 DIAGNOSIS — I119 Hypertensive heart disease without heart failure: Secondary | ICD-10-CM | POA: Diagnosis not present

## 2017-01-03 DIAGNOSIS — I2510 Atherosclerotic heart disease of native coronary artery without angina pectoris: Secondary | ICD-10-CM | POA: Diagnosis not present

## 2017-01-03 DIAGNOSIS — I5022 Chronic systolic (congestive) heart failure: Secondary | ICD-10-CM | POA: Diagnosis not present

## 2017-01-03 DIAGNOSIS — Z8546 Personal history of malignant neoplasm of prostate: Secondary | ICD-10-CM | POA: Diagnosis not present

## 2017-01-03 DIAGNOSIS — I350 Nonrheumatic aortic (valve) stenosis: Secondary | ICD-10-CM | POA: Diagnosis not present

## 2017-01-03 DIAGNOSIS — I429 Cardiomyopathy, unspecified: Secondary | ICD-10-CM | POA: Diagnosis not present

## 2017-01-03 DIAGNOSIS — Z7901 Long term (current) use of anticoagulants: Secondary | ICD-10-CM | POA: Diagnosis not present

## 2017-01-03 DIAGNOSIS — E668 Other obesity: Secondary | ICD-10-CM | POA: Diagnosis not present

## 2017-01-03 DIAGNOSIS — I480 Paroxysmal atrial fibrillation: Secondary | ICD-10-CM | POA: Diagnosis not present

## 2017-01-17 DIAGNOSIS — I119 Hypertensive heart disease without heart failure: Secondary | ICD-10-CM | POA: Diagnosis not present

## 2017-01-17 DIAGNOSIS — E668 Other obesity: Secondary | ICD-10-CM | POA: Diagnosis not present

## 2017-01-17 DIAGNOSIS — I2510 Atherosclerotic heart disease of native coronary artery without angina pectoris: Secondary | ICD-10-CM | POA: Diagnosis not present

## 2017-01-17 DIAGNOSIS — I5022 Chronic systolic (congestive) heart failure: Secondary | ICD-10-CM | POA: Diagnosis not present

## 2017-01-17 DIAGNOSIS — I350 Nonrheumatic aortic (valve) stenosis: Secondary | ICD-10-CM | POA: Diagnosis not present

## 2017-01-17 DIAGNOSIS — Z8546 Personal history of malignant neoplasm of prostate: Secondary | ICD-10-CM | POA: Diagnosis not present

## 2017-01-17 DIAGNOSIS — I480 Paroxysmal atrial fibrillation: Secondary | ICD-10-CM | POA: Diagnosis not present

## 2017-01-17 DIAGNOSIS — I429 Cardiomyopathy, unspecified: Secondary | ICD-10-CM | POA: Diagnosis not present

## 2017-01-17 DIAGNOSIS — Z955 Presence of coronary angioplasty implant and graft: Secondary | ICD-10-CM | POA: Diagnosis not present

## 2017-01-17 DIAGNOSIS — Z7901 Long term (current) use of anticoagulants: Secondary | ICD-10-CM | POA: Diagnosis not present

## 2017-01-17 DIAGNOSIS — E7849 Other hyperlipidemia: Secondary | ICD-10-CM | POA: Diagnosis not present

## 2017-01-17 DIAGNOSIS — N183 Chronic kidney disease, stage 3 (moderate): Secondary | ICD-10-CM | POA: Diagnosis not present

## 2017-02-13 ENCOUNTER — Encounter: Payer: Self-pay | Admitting: Cardiology

## 2017-02-13 DIAGNOSIS — I429 Cardiomyopathy, unspecified: Secondary | ICD-10-CM | POA: Diagnosis not present

## 2017-02-13 DIAGNOSIS — Z7901 Long term (current) use of anticoagulants: Secondary | ICD-10-CM | POA: Diagnosis not present

## 2017-02-13 DIAGNOSIS — I480 Paroxysmal atrial fibrillation: Secondary | ICD-10-CM | POA: Diagnosis not present

## 2017-02-13 DIAGNOSIS — I119 Hypertensive heart disease without heart failure: Secondary | ICD-10-CM | POA: Diagnosis not present

## 2017-02-13 DIAGNOSIS — I5022 Chronic systolic (congestive) heart failure: Secondary | ICD-10-CM | POA: Diagnosis not present

## 2017-02-13 DIAGNOSIS — E668 Other obesity: Secondary | ICD-10-CM | POA: Diagnosis not present

## 2017-02-13 DIAGNOSIS — I350 Nonrheumatic aortic (valve) stenosis: Secondary | ICD-10-CM | POA: Diagnosis not present

## 2017-02-13 DIAGNOSIS — N183 Chronic kidney disease, stage 3 (moderate): Secondary | ICD-10-CM | POA: Diagnosis not present

## 2017-02-13 DIAGNOSIS — E7849 Other hyperlipidemia: Secondary | ICD-10-CM | POA: Diagnosis not present

## 2017-02-13 DIAGNOSIS — I2510 Atherosclerotic heart disease of native coronary artery without angina pectoris: Secondary | ICD-10-CM | POA: Diagnosis not present

## 2017-02-13 DIAGNOSIS — Z8546 Personal history of malignant neoplasm of prostate: Secondary | ICD-10-CM | POA: Diagnosis not present

## 2017-02-13 DIAGNOSIS — Z955 Presence of coronary angioplasty implant and graft: Secondary | ICD-10-CM | POA: Diagnosis not present

## 2017-02-13 DIAGNOSIS — I499 Cardiac arrhythmia, unspecified: Secondary | ICD-10-CM | POA: Diagnosis not present

## 2017-02-13 NOTE — Progress Notes (Unsigned)
[name], [name]    Date of visit:  02/13/2017 DOB:  28-Oct-1931    Age:  84 yrs. Medical record number:  [id number]     Account number:  [id number] Primary Care Provider: Ernestine Conrad ____________________________ CURRENT DIAGNOSES  1. Paroxysmal atrial fibrillation  2. Hypertensive heart disease without heart failure  3. CAD Native without angina  4. Aortic valve stenosis  5. Chronic systolic heart failure  6. Long term (current) use of anticoagulants  7. Presence of coronary stent  8. Cardiomyopathy, unspecified  9. Personal History Of Malignant Neoplasm Of Prostate  10. Personal history of other venous thrombosis and embolism  11. Gastro-esophageal reflux disease  12. Chronic kidney disease, stage 3 (moderate)  13. Obesity  14. Hyperlipidemia ____________________________ ALLERGIES  No Known Allergies ____________________________ MEDICATIONS  1. amlodipine 2.5 mg tablet, 1 p.o. daily  2. biotin 2,500 mcg capsule, 1 p.o. daily  3. docusate sodium 100 mg capsule, 1/2 tab daily  4. fenofibrate 160 mg tablet, 1 p.o. daily  5. Fish Oil 300 mg-1,000 mg capsule, 2 p.o. daily  6. furosemide 40 mg tablet, 1/2 tab daily  7. Glucosamine 500 mg tablet, 2000mg  qd  8. hydrocodone 10 mg-chlorpheniramine 8 mg/5 mL oral susp extend.rel 12hr, PRN  9. hydrocodone 5 mg-acetaminophen 325 mg tablet, PRN  10. multivitamin tablet, 1 p.o. daily  11. Nexium 40 mg capsule,delayed release, 1 p.o. daily  12. nitroglycerin 0.4 mg sublingual tablet, PRN  13. pravastatin 20 mg tablet, 1 p.o. daily  14. Unisom Sleepgels 50 mg capsule, QHS  15. Vitamin B-12 1,000 mcg tablet, 1/2 tab daily  16. Vitamin C 1,000 mg tablet, 1 p.o. daily  17. Vitamin D3 1,000 unit tablet, 1 p.o. daily  18. warfarin 2.5 mg tablet, 1 p.o. daily or as directed ____________________________ HISTORY OF PRESENT ILLNESS  Patient is seen for preoperative cardiac evaluation. He has developed shoulder issues and is considering  having shoulder surgery. He has a history of atrial fibrillation and was symptomatic and had cardioversion. Because of progressive bradycardia and first-degree block he was taken off of amiodarone. He does have moderate aortic stenosis. He also has coronary artery disease but is not currently having angina. He denies PND, orthopnea or significant edema. His major issue is some mild limitations of his shoulder. He doesn't appear to be limited with shortness of breath at the present time. He has been anticoagulated and has no bleeding complications from anticoagulation. He has had a profound first-degree AV block for some time and is currently on no AV nodal blocking agents and has been off amiodarone for quite a while now. ____________________________ PAST HISTORY  Past Medical Illnesses:  hyperlipidemia, DVT, GERD, kidney stones, prostate cancer trated with surgery, hypertension, obesity, history of infection of left knee prosthesis treated with triple antibiotics for 6 months 2014-2015;  Cardiovascular Illnesses:  CAD, history of pulmonary embolus, atrial fibrillation, lateral wall MI 2011;  Surgical Procedures:  appendectomy, hemmoroidectomy, knee replacement-bil, prostatectomy 2006, multiple urethral implants for incontinence, hernia repair;  NYHA Classification:  I;  Canadian Angina Classification:  Class 0: Asymptomatic;  Cardiology Procedures-Invasive:  cardiac cath (left);  Cardiology Procedures-Noninvasive:  treadmill, echocardiogram 2015, lexiscan cardiolite 2015, echocardiogram August 2016, lexiscan Myoview August 2016, echocardiogram September 2017;  Cardiac Cath Results:  LHC (08/2009):~ Proximal LAD 30%, mid to distal LAD 25%, ostial small D1 75% mid AV groove circumflex 99% been subtotal stenosis, proximal to mid RCA 25-30%, mid RCA 30%, mid PDA 30%, EF 50% with inferior hypokinesis.;  Peripheral  Vascular Procedures:  Greenfield IVC filter 2006 for DVT following prostatectomy;  LVEF of 50% documented  via echocardiogram on 11/06/2016,   CHA2DS2-VASC Score:  4 ____________________________ CARDIO-PULMONARY TEST DATES EKG Date:  02/13/2017;  Stent Placement Date: 08/29/2009;  Nuclear Study Date:  09/24/2014;  Echocardiography Date: 11/06/2016;   ____________________________ FAMILY HISTORY Brother -- Brother dead, Malignant melanoma Father -- Father dead, Malignant melanoma Mother -- Mother dead, Death of unknown cause Sister -- Sister alive and well ____________________________ SOCIAL HISTORY Alcohol Use:  no alcohol use;  Smoking:  used to smoke but quit Prior to 1980;  Diet:  regular diet;  Lifestyle:  married;  Exercise:  some exercise 2 days per week;  Occupation:  retired and Youth worker;  Residence:  lives with wife;   ____________________________ REVIEW OF SYSTEMS General:  weight gain of approximately 5 lbs Eyes: denies diplopia, history of glaucoma or visual problems. Ears, Nose, Throat, Mouth:  partial hearing loss Respiratory: denies dyspnea, cough, wheezing or hemoptysis. Cardiovascular:  please review HPI  Genitourinary-Male: incontinence  Musculoskeletal:  osteoarthritis Endocrine: hyperthyroidism  ____________________________ PHYSICAL EXAMINATION VITAL SIGNS  Blood Pressure:  126/70 Sitting, Left arm, large cuff  , 130/78 Standing, Left arm and large cuff   Pulse:  60/min. Weight:  236.00 lbs. Height:  72"BMI: 32  Constitutional:  pleasant white male in no acute distress, moderately obese Skin:  skin tags Head:  normocephalic, normal hair pattern, no masses or tenderness Neck:  supple, without massess. No JVD, thyromegaly or carotid bruits. Carotid upstroke normal. Chest:  normal symmetry, clear to auscultation. Cardiac:  regular rhythm, normal S1 and S2, no S3 or S4, grade 2/6 systolic murmur at aortic area radiating to neck Peripheral Pulses:  femoral pulse(s) 2+, posterior tibial pulse(s) 2+ Extremities & Back:  well healed scar from knee surgery bilaterally, trace  edema Neurological:  no gross motor or sensory deficits noted, affect appropriate, oriented x3. ____________________________ MOST RECENT LIPID PANEL 09/19/16  CHOL TOTL 109 mg/dl, LDL 56 NM, HDL 34 mg/dl and TRIGLYCER 93 mg/dl ____________________________ IMPRESSIONS/PLAN  1. Intermittent complete heart block noted on EKG today. His initial EKG showed what looks like complete heart block with an escape rhythm and variable PR intervals. An EKG done a minute later showed sinus rhythm with marked first degree AV block 2. Moderate aortic stenosis 3. Coronary artery disease with previous lateral wall infarction and drug-eluting stent to the circumflex 4. History of DVT with Greenfield filter 5. Hypertension 6. Obesity  Recommendations:  Twelve-lead EKG is reviewed as above and the initial one showed what looks like complete heart block with an escape rhythm and occasional PVCs. The second EKG done a minute later showed sinus rhythm with marked first degree AV block. These were both personally reviewed by me. I would like for him to work cardiac event monitor and advised him to cut off his shoulder surgery for the time being. He has had very marked first-degree A-V block in the absence of any AV nodal blocking agents. I recommended that he have an electrophysiology consultation to determine if he needs to have a pacemaker placement or not. So far he is really not symptomatic. I suspect his AV block is a combination of conduction system disease as well as calcification around the aortic valve followup with me in 3 months. ____________________________ TODAYS ORDERS  1. Coag Clinic Visit: Coag OV 1 month  2. 12 Lead EKG: Today  3. [place]: Today  4. Return Visit: 1 month  ____________________________ Cardiology Physician:  Kerry Hough MD [facility]

## 2017-02-16 DIAGNOSIS — G4733 Obstructive sleep apnea (adult) (pediatric): Secondary | ICD-10-CM | POA: Diagnosis not present

## 2017-02-16 DIAGNOSIS — J4540 Moderate persistent asthma, uncomplicated: Secondary | ICD-10-CM | POA: Diagnosis not present

## 2017-03-02 ENCOUNTER — Encounter: Payer: Self-pay | Admitting: *Deleted

## 2017-03-07 ENCOUNTER — Ambulatory Visit (INDEPENDENT_AMBULATORY_CARE_PROVIDER_SITE_OTHER): Payer: Medicare Other | Admitting: Internal Medicine

## 2017-03-07 ENCOUNTER — Encounter: Payer: Self-pay | Admitting: Internal Medicine

## 2017-03-07 VITALS — BP 132/78 | HR 62 | Ht 72.0 in | Wt 236.0 lb

## 2017-03-07 DIAGNOSIS — I440 Atrioventricular block, first degree: Secondary | ICD-10-CM

## 2017-03-07 DIAGNOSIS — I480 Paroxysmal atrial fibrillation: Secondary | ICD-10-CM | POA: Diagnosis not present

## 2017-03-07 NOTE — H&P (View-Only) (Signed)
HPI Mr. [name] is referred today by Dr. Wynonia Lawman for evaluation of high grade heart block. He has a h/o CAD, s/p MI, EF 45%. 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 He was seen by Dr. Wynonia Lawman and noted to have transient CHB. He also has AV WB. He had dizziness at times though it is unclear if he is having orthostasis. He exercises regularly and has not noted much difference in his exercise capacity. Allergies  Allergen Reactions  . Darifenacin Nausea Only    Other reaction(s): Other (See Comments) Dizziness Dizziness Other reaction(s): Other (See Comments) Dizziness dry mouth and dizziness   . Darifenacin Hydrobromide Er Nausea Only and Other (See Comments)    Dizziness     Current Outpatient Medications  Medication Sig Dispense Refill  . albuterol (PROVENTIL HFA;VENTOLIN HFA) 108 (90 BASE) MCG/ACT inhaler Inhale 2 puffs into the lungs 2 (two) times daily as needed for wheezing.     Marland Kitchen albuterol (PROVENTIL) (2.5 MG/3ML) 0.083% nebulizer solution Take 2.5 mg by nebulization 2 (two) times daily.    Marland Kitchen amiodarone (PACERONE) 200 MG tablet Take 1 tablet (200 mg total) by mouth 2 (two) times daily. 60 tablet 0  . amLODipine (NORVASC) 2.5 MG tablet Take 2.5 mg by mouth daily.    . chlorpheniramine-HYDROcodone (TUSSIONEX) 10-8 MG/5ML SUER Take 5 mLs by mouth every 12 (twelve) hours as needed.  0  . cholecalciferol (VITAMIN D) 1000 UNITS tablet Take 1,000 Units by mouth daily.    . Cyanocobalamin (B-12) 1000 MCG CAPS Take 1 tablet by mouth daily.     . Diphenhydramine-APAP, sleep, (EXCEDRIN PM PO) Take 1 tablet by mouth at bedtime.     . docusate sodium (COLACE) 50 MG capsule Take 50 mg by mouth daily.    Marland Kitchen esomeprazole (NEXIUM) 40 MG capsule Take 40 mg by mouth daily before breakfast.     . fenofibrate 160 MG tablet Take 1 tablet (160 mg total) by mouth daily. (Patient taking differently: Take 160 mg by mouth at bedtime. ) 30 tablet 5  . furosemide (LASIX) 20 MG tablet Take  20 mg by mouth daily.    . Misc Natural Products (GLUCOSAMINE CHONDROITIN ADV PO) Take 1 tablet by mouth daily.     . Multiple Vitamin (MULTIVITAMIN) capsule Take 1 capsule by mouth daily.     . nitroGLYCERIN (NITROSTAT) 0.4 MG SL tablet Place 1 tablet (0.4 mg total) under the tongue every 5 (five) minutes as needed. 25 tablet 2  . Omega-3 Fatty Acids (FISH OIL CONCENTRATE PO) Take 2 capsules by mouth daily.     . pravastatin (PRAVACHOL) 20 MG tablet TAKE 1 TABLET BY MOUTH EVERY DAY (Patient taking differently: TAKE 1 TABLET BY MOUTH EVERY evening) 30 tablet 7  . warfarin (COUMADIN) 5 MG tablet Take 0.5 tablets (2.5 mg total) by mouth daily.     No current facility-administered medications for this visit.      Past Medical History:  Diagnosis Date  . Aortic stenosis    a. Echo (06/2013): Severe LVH, EF 55-60%, mild to moderate AS (mean 15 mm Hg), mild AI, mild LAE  . Aortic stenosis    Mild-mod by echo 06/2013  . Atrial fibrillation ([place])    Dx 2016, placed on amiodarone  . CAD (coronary artery disease), native coronary artery    Cath 2011 LHC (08/2009):~ Proximal LAD 30%, mid to distal LAD 25%, ostial small D1 75% mid AV groove circumflex 99% been subtotal  stenosis, proximal to mid RCA 25-30%, mid RCA 30%, mid PDA 30%, EF 50% with inferior hypokinesis.;    July 2011  PCI and DES to circumflex Dr. Olevia Perches   ETT-Myoview (06/2013):  Inferolateral scar, mild peri-infarct ischemia, EF 40%; Intermediate Risk   ECHO EF 55% 03/2013   . CKD (chronic kidney disease), stage III ([place])   . GERD   . History of DVT (deep vein thrombosis)    DVT in setting of prostatectomy in 2006, DVT of R [place] vein 10/2012 in the setting of knee infection  . History of infection of prosthetic left knee joint    Treated with debridement and irrigation followed by 6 months of triple antibiotics   . History of prostate cancer    Radical prostatectomy in 2006 Dr. Terance Hart   . History of pulmonary embolism    bilateral PE in  2013 s/p Greenfield filter  . Hypertension   . Hypertensive heart disease   . Incontinence of urine    Multiple urologic procedures   . Mixed hyperlipidemia   . Obesity (BMI 30-39.9)   . OSA (obstructive sleep apnea)   . 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)    Initial DVT in 2006 after prostate surgery and had Greenfield filter placed Bilateral  PE 2013 and placed back on warfarin DVT of right subclavian vein on doppler 10/2012 at time of knee infection   . Urinary incontinence    MULTIPLE BLADDER SURGERIES - STATES NO URINARY SPHINCTER - PT'S UROLOGIST IS AT [facility]- DR. PETERSON  ( LAST VISIT WAS 09/15/11 )    ROS:   All systems reviewed and negative except as noted in the HPI.   Past Surgical History:  Procedure Laterality Date  . CARDIAC CATHETERIZATION  2011   DES-mid LCx 08/2009; 30% pLAD, 25% m/dLAD, 75% ostial D1, 99% mLCx s/p DES, 25-30% p/mRCA, 40% mRCA, 30% mPDA stenoses; LVEF 50%, inf hypokinesis  . CARDIOVERSION N/A 10/08/2014   Procedure: CARDIOVERSION;  Surgeon: Jacolyn Reedy, MD;  Location: [place];  Service: Cardiovascular;  Laterality: N/A;  . CORONARY ANGIOPLASTY  08/25/09   STENT PLACEMENT  . HEMORRHOID SURGERY    . HERNIA REPAIR    . HERNIA REPAIR    . I&D KNEE WITH POLY EXCHANGE Left 10/09/2012   Procedure: IRRIGATION AND DEBRIDEMENT LEFT KNEE WITH POLY REVISION;  Surgeon: Gearlean Alf, MD;  Location: [facility];  Service: Orthopedics;  Laterality: Left;  . KNEE ARTHROTOMY Left 10/09/2012   Procedure: LEFT KNEE ARTHROTOMY;  Surgeon: Gearlean Alf, MD;  Location: [facility];  Service: Orthopedics;  Laterality: Left;  . PROSTATECTOMY  04/25/2004  . REPLACEMENT TOTAL KNEE  bilateral  . Uretheral implants     multiple for incontinence  . VENA CAVA FILTER PLACEMENT       History reviewed. No pertinent family history.   Social History   Socioeconomic History  . Marital status: Married    Spouse name: Not on file  . Number of  children: 3  . Years of education: Not on file  . Highest education level: Not on file  Social Needs  . Financial resource strain: Not on file  . Food insecurity - worry: Not on file  . Food insecurity - inability: Not on file  . Transportation needs - medical: Not on file  . Transportation needs - non-medical: Not on file  Occupational History  . Occupation: Retired Therapist, nutritional  Tobacco Use  . Smoking status: Former  Smoker    Last attempt to quit: 04/27/1961    Years since quitting: 55.8  . Smokeless tobacco: Never Used  Substance and Sexual Activity  . Alcohol use: No    Alcohol/week: 0.0 oz  . Drug use: No  . Sexual activity: Not on file  Other Topics Concern  . Not on file  Social History Narrative  . Not on file     BP 132/78   Pulse 62   Ht 6' (1.829 m)   Wt 236 lb (107 kg)   SpO2 98%   BMI 32.01 kg/m   Physical Exam:  Well appearing 84 yo man, NAD HEENT: Unremarkable Neck:  No JVD, no thyromegally Lymphatics:  No adenopathy Back:  No CVA tenderness Lungs:  Clear with no wheezesHEART:  Regular rate rhythm, no murmurs, no rubs, no clicks Abd:  soft, positive bowel sounds, no organomegally, no rebound, no guarding Ext:  2 plus pulses, no edema, no cyanosis, no clubbing Skin:  No rashes no nodules Neuro:  CN II through XII intact, motor grossly intact  EKG - nsr with marked first degree AV Block with AVWB.  DEVICE  Normal device function.  See PaceArt for details.   Assess/Plan: 1. High grade heart block - I have discussed the treatment options in detail. With his PAF, and need for amio and fairly severe conduction system disease, he has an indication for PPM. Whether he has this now or undergoes a period of watchful waiting I have left to him although I am concerned about falls with CHB and syncope and would ultimately recommended device placement to prevent syncope sooner than later. 2. CAD/LV dysfunction - he has mild LV dysfunction. I would recommend  BiV Pacemaker insertion.  3. PAF - he will likely need an AA drug in the future. His amio has been held. Might consider re-initiation in the future.  4. HTN heart disease - his blood pressure is controlled. Will follow.   Mikle Bosworth.D.

## 2017-03-07 NOTE — Progress Notes (Signed)
HPI Mr. Lembke is referred today by Dr. Wynonia Lawman for evaluation of high grade heart block. He has a h/o CAD, s/p MI, EF 45%. 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 He was seen by Dr. Wynonia Lawman and noted to have transient CHB. He also has AV WB. He had dizziness at times though it is unclear if he is having orthostasis. He exercises regularly and has not noted much difference in his exercise capacity. Allergies  Allergen Reactions  . Darifenacin Nausea Only    Other reaction(s): Other (See Comments) Dizziness Dizziness Other reaction(s): Other (See Comments) Dizziness dry mouth and dizziness   . Darifenacin Hydrobromide Er Nausea Only and Other (See Comments)    Dizziness     Current Outpatient Medications  Medication Sig Dispense Refill  . albuterol (PROVENTIL HFA;VENTOLIN HFA) 108 (90 BASE) MCG/ACT inhaler Inhale 2 puffs into the lungs 2 (two) times daily as needed for wheezing.     Marland Kitchen albuterol (PROVENTIL) (2.5 MG/3ML) 0.083% nebulizer solution Take 2.5 mg by nebulization 2 (two) times daily.    Marland Kitchen amiodarone (PACERONE) 200 MG tablet Take 1 tablet (200 mg total) by mouth 2 (two) times daily. 60 tablet 0  . amLODipine (NORVASC) 2.5 MG tablet Take 2.5 mg by mouth daily.    . chlorpheniramine-HYDROcodone (TUSSIONEX) 10-8 MG/5ML SUER Take 5 mLs by mouth every 12 (twelve) hours as needed.  0  . cholecalciferol (VITAMIN D) 1000 UNITS tablet Take 1,000 Units by mouth daily.    . Cyanocobalamin (B-12) 1000 MCG CAPS Take 1 tablet by mouth daily.     . Diphenhydramine-APAP, sleep, (EXCEDRIN PM PO) Take 1 tablet by mouth at bedtime.     . docusate sodium (COLACE) 50 MG capsule Take 50 mg by mouth daily.    Marland Kitchen esomeprazole (NEXIUM) 40 MG capsule Take 40 mg by mouth daily before breakfast.     . fenofibrate 160 MG tablet Take 1 tablet (160 mg total) by mouth daily. (Patient taking differently: Take 160 mg by mouth at bedtime. ) 30 tablet 5  . furosemide (LASIX) 20 MG tablet Take  20 mg by mouth daily.    . Misc Natural Products (GLUCOSAMINE CHONDROITIN ADV PO) Take 1 tablet by mouth daily.     . Multiple Vitamin (MULTIVITAMIN) capsule Take 1 capsule by mouth daily.     . nitroGLYCERIN (NITROSTAT) 0.4 MG SL tablet Place 1 tablet (0.4 mg total) under the tongue every 5 (five) minutes as needed. 25 tablet 2  . Omega-3 Fatty Acids (FISH OIL CONCENTRATE PO) Take 2 capsules by mouth daily.     . pravastatin (PRAVACHOL) 20 MG tablet TAKE 1 TABLET BY MOUTH EVERY DAY (Patient taking differently: TAKE 1 TABLET BY MOUTH EVERY evening) 30 tablet 7  . warfarin (COUMADIN) 5 MG tablet Take 0.5 tablets (2.5 mg total) by mouth daily.     No current facility-administered medications for this visit.      Past Medical History:  Diagnosis Date  . Aortic stenosis    a. Echo (06/2013): Severe LVH, EF 55-60%, mild to moderate AS (mean 15 mm Hg), mild AI, mild LAE  . Aortic stenosis    Mild-mod by echo 06/2013  . Atrial fibrillation ([place])    Dx 2016, placed on amiodarone  . CAD (coronary artery disease), native coronary artery    Cath 2011 [facility] (08/2009):~ Proximal LAD 30%, mid to distal LAD 25%, ostial small D1 75% mid AV groove circumflex 99% been subtotal  stenosis, proximal to mid RCA 25-30%, mid RCA 30%, mid PDA 30%, EF 50% with inferior hypokinesis.;    July 2011  PCI and DES to circumflex Dr. Olevia Perches   ETT-Myoview (06/2013):  Inferolateral scar, mild peri-infarct ischemia, EF 40%; Intermediate Risk   ECHO EF 55% 03/2013   . CKD (chronic kidney disease), stage III ([place])   . GERD   . History of DVT (deep vein thrombosis)    DVT in setting of prostatectomy in 2006, DVT of R [place]ave vein 10/2012 in the setting of knee infection  . History of infection of prosthetic left knee joint    Treated with debridement and irrigation followed by 6 months of triple antibiotics   . History of prostate cancer    Radical prostatectomy in 2006 Dr. Terance Hart   . History of pulmonary embolism    bilateral PE in  2013 s/p Greenfield filter  . Hypertension   . Hypertensive heart disease   . Incontinence of urine    Multiple urologic procedures   . Mixed hyperlipidemia   . Obesity (BMI 30-39.9)   . OSA (obstructive sleep apnea)   . 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)    Initial DVT in 2006 after prostate surgery and had Greenfield filter placed Bilateral  PE 2013 and placed back on warfarin DVT of right subclavian vein on doppler 10/2012 at time of knee infection   . Urinary incontinence    MULTIPLE BLADDER SURGERIES - STATES NO URINARY SPHINCTER - PT'S UROLOGIST IS AT [facility]- DR. PETERSON  ( LAST VISIT WAS 09/15/11 )    ROS:   All systems reviewed and negative except as noted in the HPI.   Past Surgical History:  Procedure Laterality Date  . CARDIAC CATHETERIZATION  2011   DES-mid LCx 08/2009; 30% pLAD, 25% m/dLAD, 75% ostial D1, 99% mLCx s/p DES, 25-30% p/mRCA, 40% mRCA, 30% mPDA stenoses; LVEF 50%, inf hypokinesis  . CARDIOVERSION N/A 10/08/2014   Procedure: CARDIOVERSION;  Surgeon: Jacolyn Reedy, MD;  Location: [facility];  Service: Cardiovascular;  Laterality: N/A;  . CORONARY ANGIOPLASTY  08/25/09   STENT PLACEMENT  . HEMORRHOID SURGERY    . HERNIA REPAIR    . HERNIA REPAIR    . I&D KNEE WITH POLY EXCHANGE Left 10/09/2012   Procedure: IRRIGATION AND DEBRIDEMENT LEFT KNEE WITH POLY REVISION;  Surgeon: Gearlean Alf, MD;  Location: [facility];  Service: Orthopedics;  Laterality: Left;  . KNEE ARTHROTOMY Left 10/09/2012   Procedure: LEFT KNEE ARTHROTOMY;  Surgeon: Gearlean Alf, MD;  Location: [facility];  Service: Orthopedics;  Laterality: Left;  . PROSTATECTOMY  04/25/2004  . REPLACEMENT TOTAL KNEE  bilateral  . Uretheral implants     multiple for incontinence  . VENA CAVA FILTER PLACEMENT       History reviewed. No pertinent family history.   Social History   Socioeconomic History  . Marital status: Married    Spouse name: Not on file  . Number of  children: 3  . Years of education: Not on file  . Highest education level: Not on file  Social Needs  . Financial resource strain: Not on file  . Food insecurity - worry: Not on file  . Food insecurity - inability: Not on file  . Transportation needs - medical: Not on file  . Transportation needs - non-medical: Not on file  Occupational History  . Occupation: Retired Therapist, nutritional  Tobacco Use  . Smoking status: Former  Smoker    Last attempt to quit: 04/27/1961    Years since quitting: 55.8  . Smokeless tobacco: Never Used  Substance and Sexual Activity  . Alcohol use: No    Alcohol/week: 0.0 oz  . Drug use: No  . Sexual activity: Not on file  Other Topics Concern  . Not on file  Social History Narrative  . Not on file     BP 132/78   Pulse 62   Ht 6' (1.829 m)   Wt 236 lb (107 kg)   SpO2 98%   BMI 32.01 kg/m   Physical Exam:  Well appearing 84 yo man, NAD HEENT: Unremarkable Neck:  No JVD, no thyromegally Lymphatics:  No adenopathy Back:  No CVA tenderness Lungs:  Clear with no wheezesHEART:  Regular rate rhythm, no murmurs, no rubs, no clicks Abd:  soft, positive bowel sounds, no organomegally, no rebound, no guarding Ext:  2 plus pulses, no edema, no cyanosis, no clubbing Skin:  No rashes no nodules Neuro:  CN II through XII intact, motor grossly intact  EKG - nsr with marked first degree AV Block with AVWB.  DEVICE  Normal device function.  See PaceArt for details.   Assess/Plan: 1. High grade heart block - I have discussed the treatment options in detail. With his PAF, and need for amio and fairly severe conduction system disease, he has an indication for PPM. Whether he has this now or undergoes a period of watchful waiting I have left to him although I am concerned about falls with CHB and syncope and would ultimately recommended device placement to prevent syncope sooner than later. 2. CAD/LV dysfunction - he has mild LV dysfunction. I would recommend  BiV Pacemaker insertion.  3. PAF - he will likely need an AA drug in the future. His amio has been held. Might consider re-initiation in the future.  4. HTN heart disease - his blood pressure is controlled. Will follow.   Mikle Bosworth.D.

## 2017-03-07 NOTE — Patient Instructions (Addendum)
Medication Instructions:  Your physician recommends that you continue on your current medications as directed. Please refer to the Current Medication list given to you today.  Labwork: None ordered.  Testing/Procedures: Your physician has recommended that you have a pacemaker inserted. A pacemaker is a small device that is placed under the skin of your chest or abdomen to help control abnormal heart rhythms. This device uses electrical pulses to prompt the heart to beat at a normal rate. Pacemakers are used to treat heart rhythms that are too slow. Wire (leads) are attached to the pacemaker that goes into the chambers of you heart. This is done in the hospital and usually requires and overnight stay. Please see the instruction sheet given to you today for more information.  Follow-Up: The following dates in February are available for pacemaker placement with Dr. Lovena Le. February 6, 8, 11, 14, 19, 20, 25 and 26 If you decide to go ahead with the procedure please call me: Myrtie Hawk, RN with Dr. Lovena Le [phone]  Any Other Special Instructions Will Be Listed Below (If Applicable).   If you need a refill on your cardiac medications before your next appointment, please call your pharmacy.   Pacemaker Implantation, Adult Pacemaker implantation is a procedure to place a pacemaker inside your chest. A pacemaker is a small computer that sends electrical signals to the heart and helps your heart beat normally. A pacemaker also stores information about your heart rhythms. You may need pacemaker implantation if you:  Have a slow heartbeat (bradycardia).  Faint (syncope).  Have shortness of breath (dyspnea) due to heart problems.  The pacemaker attaches to your heart through a wire, called a lead. Sometimes just one lead is needed. Other times, there will be two leads. There are two types of pacemakers:  Transvenous pacemaker. This type is placed under the skin or muscle of your chest. The  lead goes through a vein in the chest area to reach the inside of the heart.  Epicardial pacemaker. This type is placed under the skin or muscle of your chest or belly. The lead goes through your chest to the outside of the heart.  Tell a health care provider about:  Any allergies you have.  All medicines you are taking, including vitamins, herbs, eye drops, creams, and over-the-counter medicines.  Any problems you or family members have had with anesthetic medicines.  Any blood or bone disorders you have.  Any surgeries you have had.  Any medical conditions you have.  Whether you are pregnant or may be pregnant. What are the risks? Generally, this is a safe procedure. However, problems may occur, including:  Infection.  Bleeding.  Failure of the pacemaker or the lead.  Collapse of a lung or bleeding into a lung.  Blood clot inside a blood vessel with a lead.  Damage to the heart.  Infection inside the heart (endocarditis).  Allergic reactions to medicines.  What happens before the procedure? Staying hydrated Follow instructions from your health care provider about hydration, which may include:  Up to 2 hours before the procedure - you may continue to drink clear liquids, such as water, clear fruit juice, black coffee, and plain tea.  Eating and drinking restrictions Follow instructions from your health care provider about eating and drinking, which may include:  8 hours before the procedure - stop eating heavy meals or foods such as meat, fried foods, or fatty foods.  6 hours before the procedure - stop eating light meals or foods,  such as toast or cereal.  6 hours before the procedure - stop drinking milk or drinks that contain milk.  2 hours before the procedure - stop drinking clear liquids.  Medicines  Ask your health care provider about: ? Changing or stopping your regular medicines. This is especially important if you are taking diabetes medicines or  blood thinners. ? Taking medicines such as aspirin and ibuprofen. These medicines can thin your blood. Do not take these medicines before your procedure if your health care provider instructs you not to.  You may be given antibiotic medicine to help prevent infection. General instructions  You will have a heart evaluation. This may include an electrocardiogram (ECG), chest X-ray, and heart imaging (echocardiogram,  or echo) tests.  You will have blood tests.  Do not use any products that contain nicotine or tobacco, such as cigarettes and e-cigarettes. If you need help quitting, ask your health care provider.  Plan to have someone take you home from the hospital or clinic.  If you will be going home right after the procedure, plan to have someone with you for 24 hours.  Ask your health care provider how your surgical site will be marked or identified. What happens during the procedure?  To reduce your risk of infection: ? Your health care team will wash or sanitize their hands. ? Your skin will be washed with soap. ? Hair may be removed from the surgical area.  An IV tube will be inserted into one of your veins.  You will be given one or more of the following: ? A medicine to help you relax (sedative). ? A medicine to numb the area (local anesthetic). ? A medicine to make you fall asleep (general anesthetic).  If you are getting a transvenous pacemaker: ? An incision will be made in your upper chest. ? A pocket will be made for the pacemaker. It may be placed under the skin or between layers of muscle. ? The lead will be inserted into a blood vessel that returns to the heart. ? While X-rays are taken by an imaging machine (fluoroscopy), the lead will be advanced through the vein to the inside of your heart. ? The other end of the lead will be tunneled under the skin and attached to the pacemaker.  If you are getting an epicardial pacemaker: ? An incision will be made near your  ribs or breastbone (sternum) for the lead. ? The lead will be attached to the outside of your heart. ? Another incision will be made in your chest or upper belly to create a pocket for the pacemaker. ? The free end of the lead will be tunneled under the skin and attached to the pacemaker.  The transvenous or epicardial pacemaker will be tested. Imaging studies may be done to check the lead position.  The incisions will be closed with stitches (sutures), adhesive strips, or skin glue.  Bandages (dressing) will be placed over the incisions. The procedure may vary among health care providers and hospitals. What happens after the procedure?  Your blood pressure, heart rate, breathing rate, and blood oxygen level will be monitored until the medicines you were given have worn off.  You will be given antibiotics and pain medicine.  ECG and chest x-rays will be done.  You will wear a continuous type of ECG (Holter monitor) to check your heart rhythm.  Your health care provider willprogram the pacemaker.  Do not drive for 24 hours if you received a  sedative. This information is not intended to replace advice given to you by your health care provider. Make sure you discuss any questions you have with your health care provider. Document Released: 01/13/2002 Document Revised: 08/13/2015 Document Reviewed: 07/07/2015 Elsevier Interactive Patient Education  [employer].

## 2017-03-08 NOTE — Addendum Note (Signed)
Addended by: De Burrs on: 03/08/2017 12:37 PM   Modules accepted: Orders

## 2017-03-09 ENCOUNTER — Other Ambulatory Visit: Payer: Medicare Other | Admitting: *Deleted

## 2017-03-09 ENCOUNTER — Other Ambulatory Visit: Payer: Self-pay

## 2017-03-09 ENCOUNTER — Telehealth: Payer: Self-pay | Admitting: Internal Medicine

## 2017-03-09 DIAGNOSIS — I442 Atrioventricular block, complete: Secondary | ICD-10-CM

## 2017-03-09 DIAGNOSIS — Z86718 Personal history of other venous thrombosis and embolism: Secondary | ICD-10-CM

## 2017-03-09 DIAGNOSIS — I440 Atrioventricular block, first degree: Secondary | ICD-10-CM

## 2017-03-09 LAB — PROTIME-INR
INR: 2.7 — ABNORMAL HIGH (ref 0.8–1.2)
Prothrombin Time: 28.4 s — ABNORMAL HIGH (ref 9.1–12.0)

## 2017-03-09 NOTE — Telephone Encounter (Signed)
Patient calling,  States that he would like to schedule the procedure and would like to speak with you before lunch if possible

## 2017-03-09 NOTE — Telephone Encounter (Signed)
Call received from Pt.  Would like to go ahead and schedule procedure for February 6.  Verified with Dr. Lovena Le date and time ok.   Will proceed with [facility] jude biv ppm on February 6.  Pt to arrive to [address] office for labs, will get stat pt/inr and follow up with pharmacist to confirm coumadin instruction.  Instruction letter and soap at front desk for pick up.

## 2017-03-10 LAB — CBC WITH DIFFERENTIAL/PLATELET
BASOS ABS: 0.1 10*3/uL (ref 0.0–0.2)
Basos: 1 %
EOS (ABSOLUTE): 0.4 10*3/uL (ref 0.0–0.4)
Eos: 6 %
HEMOGLOBIN: 15.4 g/dL (ref 13.0–17.7)
Hematocrit: 46.9 % (ref 37.5–51.0)
Immature Grans (Abs): 0 10*3/uL (ref 0.0–0.1)
Immature Granulocytes: 0 %
LYMPHS ABS: 2.2 10*3/uL (ref 0.7–3.1)
Lymphs: 36 %
MCH: 27.6 pg (ref 26.6–33.0)
MCHC: 32.8 g/dL (ref 31.5–35.7)
MCV: 84 fL (ref 79–97)
MONOCYTES: 11 %
MONOS ABS: 0.7 10*3/uL (ref 0.1–0.9)
NEUTROS ABS: 2.8 10*3/uL (ref 1.4–7.0)
Neutrophils: 46 %
PLATELETS: 229 10*3/uL (ref 150–379)
RBC: 5.57 x10E6/uL (ref 4.14–5.80)
RDW: 14.8 % (ref 12.3–15.4)
WBC: 6.1 10*3/uL (ref 3.4–10.8)

## 2017-03-10 LAB — BASIC METABOLIC PANEL
BUN / CREAT RATIO: 20 (ref 10–24)
BUN: 31 mg/dL — ABNORMAL HIGH (ref 8–27)
CO2: 23 mmol/L (ref 20–29)
Calcium: 10 mg/dL (ref 8.6–10.2)
Chloride: 106 mmol/L (ref 96–106)
Creatinine, Ser: 1.55 mg/dL — ABNORMAL HIGH (ref 0.76–1.27)
GFR calc Af Amer: 47 mL/min/{1.73_m2} — ABNORMAL LOW (ref >59)
GFR calc non Af Amer: 41 mL/min/{1.73_m2} — ABNORMAL LOW (ref >59)
GLUCOSE: 85 mg/dL (ref 65–99)
Potassium: 4.6 mmol/L (ref 3.5–5.2)
SODIUM: 143 mmol/L (ref 134–144)

## 2017-03-12 DIAGNOSIS — I119 Hypertensive heart disease without heart failure: Secondary | ICD-10-CM | POA: Diagnosis not present

## 2017-03-12 DIAGNOSIS — E668 Other obesity: Secondary | ICD-10-CM | POA: Diagnosis not present

## 2017-03-12 DIAGNOSIS — I429 Cardiomyopathy, unspecified: Secondary | ICD-10-CM | POA: Diagnosis not present

## 2017-03-12 DIAGNOSIS — N183 Chronic kidney disease, stage 3 (moderate): Secondary | ICD-10-CM | POA: Diagnosis not present

## 2017-03-12 DIAGNOSIS — I2510 Atherosclerotic heart disease of native coronary artery without angina pectoris: Secondary | ICD-10-CM | POA: Diagnosis not present

## 2017-03-12 DIAGNOSIS — I350 Nonrheumatic aortic (valve) stenosis: Secondary | ICD-10-CM | POA: Diagnosis not present

## 2017-03-12 DIAGNOSIS — Z7901 Long term (current) use of anticoagulants: Secondary | ICD-10-CM | POA: Diagnosis not present

## 2017-03-12 DIAGNOSIS — Z955 Presence of coronary angioplasty implant and graft: Secondary | ICD-10-CM | POA: Diagnosis not present

## 2017-03-12 DIAGNOSIS — Z8546 Personal history of malignant neoplasm of prostate: Secondary | ICD-10-CM | POA: Diagnosis not present

## 2017-03-12 DIAGNOSIS — E7849 Other hyperlipidemia: Secondary | ICD-10-CM | POA: Diagnosis not present

## 2017-03-12 DIAGNOSIS — I5022 Chronic systolic (congestive) heart failure: Secondary | ICD-10-CM | POA: Diagnosis not present

## 2017-03-12 DIAGNOSIS — I480 Paroxysmal atrial fibrillation: Secondary | ICD-10-CM | POA: Diagnosis not present

## 2017-03-13 ENCOUNTER — Encounter: Payer: Self-pay | Admitting: Internal Medicine

## 2017-03-14 ENCOUNTER — Ambulatory Visit (HOSPITAL_COMMUNITY)
Admission: RE | Disposition: A | Discharge status: Home / Self Care | Payer: Self-pay | Source: Ambulatory Visit | Attending: Internal Medicine

## 2017-03-14 ENCOUNTER — Other Ambulatory Visit: Payer: Self-pay

## 2017-03-14 ENCOUNTER — Encounter (HOSPITAL_COMMUNITY): Payer: Self-pay | Admitting: Cardiology

## 2017-03-14 ENCOUNTER — Ambulatory Visit (HOSPITAL_COMMUNITY)
Admission: RE | Admit: 2017-03-14 | Discharge: 2017-03-15 | Disposition: A | Discharge status: Home / Self Care | Payer: Medicare Other | Source: Ambulatory Visit | Attending: Internal Medicine | Admitting: Internal Medicine

## 2017-03-14 DIAGNOSIS — I480 Paroxysmal atrial fibrillation: Secondary | ICD-10-CM | POA: Insufficient documentation

## 2017-03-14 DIAGNOSIS — I441 Atrioventricular block, second degree: Secondary | ICD-10-CM | POA: Diagnosis not present

## 2017-03-14 DIAGNOSIS — Z7901 Long term (current) use of anticoagulants: Secondary | ICD-10-CM | POA: Diagnosis not present

## 2017-03-14 DIAGNOSIS — I509 Heart failure, unspecified: Secondary | ICD-10-CM | POA: Insufficient documentation

## 2017-03-14 DIAGNOSIS — Z950 Presence of cardiac pacemaker: Secondary | ICD-10-CM

## 2017-03-14 DIAGNOSIS — G4733 Obstructive sleep apnea (adult) (pediatric): Secondary | ICD-10-CM | POA: Diagnosis not present

## 2017-03-14 DIAGNOSIS — I252 Old myocardial infarction: Secondary | ICD-10-CM | POA: Diagnosis not present

## 2017-03-14 DIAGNOSIS — I428 Other cardiomyopathies: Secondary | ICD-10-CM | POA: Diagnosis not present

## 2017-03-14 DIAGNOSIS — Z86711 Personal history of pulmonary embolism: Secondary | ICD-10-CM | POA: Diagnosis not present

## 2017-03-14 DIAGNOSIS — I350 Nonrheumatic aortic (valve) stenosis: Secondary | ICD-10-CM | POA: Insufficient documentation

## 2017-03-14 DIAGNOSIS — Z86718 Personal history of other venous thrombosis and embolism: Secondary | ICD-10-CM | POA: Diagnosis not present

## 2017-03-14 DIAGNOSIS — E669 Obesity, unspecified: Secondary | ICD-10-CM | POA: Insufficient documentation

## 2017-03-14 DIAGNOSIS — N183 Chronic kidney disease, stage 3 (moderate): Secondary | ICD-10-CM | POA: Diagnosis not present

## 2017-03-14 DIAGNOSIS — Z955 Presence of coronary angioplasty implant and graft: Secondary | ICD-10-CM | POA: Insufficient documentation

## 2017-03-14 DIAGNOSIS — I2510 Atherosclerotic heart disease of native coronary artery without angina pectoris: Secondary | ICD-10-CM | POA: Insufficient documentation

## 2017-03-14 DIAGNOSIS — E782 Mixed hyperlipidemia: Secondary | ICD-10-CM | POA: Diagnosis not present

## 2017-03-14 DIAGNOSIS — I130 Hypertensive heart and chronic kidney disease with heart failure and stage 1 through stage 4 chronic kidney disease, or unspecified chronic kidney disease: Secondary | ICD-10-CM | POA: Diagnosis not present

## 2017-03-14 DIAGNOSIS — K219 Gastro-esophageal reflux disease without esophagitis: Secondary | ICD-10-CM | POA: Diagnosis not present

## 2017-03-14 DIAGNOSIS — Z87891 Personal history of nicotine dependence: Secondary | ICD-10-CM | POA: Insufficient documentation

## 2017-03-14 DIAGNOSIS — Z6832 Body mass index (BMI) 32.0-32.9, adult: Secondary | ICD-10-CM | POA: Diagnosis not present

## 2017-03-14 DIAGNOSIS — I5022 Chronic systolic (congestive) heart failure: Secondary | ICD-10-CM | POA: Diagnosis not present

## 2017-03-14 HISTORY — DX: Unspecified osteoarthritis, unspecified site: M19.90

## 2017-03-14 HISTORY — DX: Presence of cardiac pacemaker: Z95.0

## 2017-03-14 HISTORY — PX: BIV PACEMAKER INSERTION CRT-P: EP1199

## 2017-03-14 HISTORY — DX: Personal history of peptic ulcer disease: Z87.11

## 2017-03-14 HISTORY — DX: Obstructive sleep apnea (adult) (pediatric): G47.33

## 2017-03-14 HISTORY — DX: Personal history of urinary calculi: Z87.442

## 2017-03-14 HISTORY — DX: Dependence on other enabling machines and devices: Z99.89

## 2017-03-14 HISTORY — DX: Atrioventricular block, second degree: I44.1

## 2017-03-14 HISTORY — PX: BI-VENTRICULAR PACEMAKER INSERTION (CRT-P): SHX5750

## 2017-03-14 LAB — PROTIME-INR
INR: 1.99
Prothrombin Time: 22.4 seconds — ABNORMAL HIGH (ref 11.4–15.2)

## 2017-03-14 LAB — SURGICAL PCR SCREEN
MRSA, PCR: NEGATIVE
Staphylococcus aureus: NEGATIVE

## 2017-03-14 SURGERY — BIV PACEMAKER INSERTION CRT-P
Anesthesia: LOCAL

## 2017-03-14 MED ORDER — FENTANYL CITRATE (PF) 100 MCG/2ML IJ SOLN
INTRAMUSCULAR | Status: AC
  Filled 2017-03-14: qty 2

## 2017-03-14 MED ORDER — VITAMIN C 500 MG PO TABS: 1000.0000 mg | ORAL_TABLET | Freq: Every day | ORAL | Status: DC

## 2017-03-14 MED ORDER — LIDOCAINE HCL (PF) 1 % IJ SOLN
INTRAMUSCULAR | Status: DC | PRN
  Administered 2017-03-14: 60 mL

## 2017-03-14 MED ORDER — CHLORHEXIDINE GLUCONATE 4 % EX LIQD: 60.0000 mL | Freq: Once | CUTANEOUS | Status: DC

## 2017-03-14 MED ORDER — IOPAMIDOL (ISOVUE-370) INJECTION 76%
INTRAVENOUS | Status: AC
  Filled 2017-03-14: qty 50

## 2017-03-14 MED ORDER — ONDANSETRON HCL 4 MG/2ML IJ SOLN: 4.0000 mg | Freq: Four times a day (QID) | INTRAMUSCULAR | Status: DC | PRN

## 2017-03-14 MED ORDER — SODIUM CHLORIDE 0.9 % IV SOLN
INTRAVENOUS | Status: DC
  Administered 2017-03-14: 13:00:00 via INTRAVENOUS

## 2017-03-14 MED ORDER — SODIUM CHLORIDE 0.9 % IR SOLN
Status: AC
  Filled 2017-03-14: qty 2

## 2017-03-14 MED ORDER — MIDAZOLAM HCL 5 MG/5ML IJ SOLN
INTRAMUSCULAR | Status: DC | PRN
  Administered 2017-03-14 (×3): 1 mg via INTRAVENOUS
  Administered 2017-03-14: 2 mg via INTRAVENOUS

## 2017-03-14 MED ORDER — BIOTIN 2500 MCG PO CAPS: 2500.0000 ug | ORAL_CAPSULE | Freq: Every day | ORAL | Status: DC

## 2017-03-14 MED ORDER — WARFARIN - PHYSICIAN DOSING INPATIENT
Freq: Every day | Status: DC
  Administered 2017-03-14: 23:00:00

## 2017-03-14 MED ORDER — CEFAZOLIN SODIUM-DEXTROSE 2-4 GM/100ML-% IV SOLN
2.0000 g | INTRAVENOUS | Status: AC
  Administered 2017-03-14: 2 g via INTRAVENOUS

## 2017-03-14 MED ORDER — LIDOCAINE HCL 1 % IJ SOLN
INTRAMUSCULAR | Status: AC
  Filled 2017-03-14: qty 60

## 2017-03-14 MED ORDER — PANTOPRAZOLE SODIUM 40 MG PO TBEC: 40.0000 mg | DELAYED_RELEASE_TABLET | Freq: Every day | ORAL | Status: DC

## 2017-03-14 MED ORDER — SODIUM CHLORIDE 0.9 % IR SOLN
80.0000 mg | Status: AC
  Administered 2017-03-14: 80 mg

## 2017-03-14 MED ORDER — CYANOCOBALAMIN 250 MCG PO TABS
500.0000 ug | ORAL_TABLET | Freq: Every day | ORAL | Status: DC
  Filled 2017-03-14: qty 2

## 2017-03-14 MED ORDER — CEFAZOLIN SODIUM-DEXTROSE 1-4 GM/50ML-% IV SOLN
1.0000 g | Freq: Four times a day (QID) | INTRAVENOUS | Status: DC
  Administered 2017-03-14 – 2017-03-15 (×2): 1 g via INTRAVENOUS
  Filled 2017-03-14 (×3): qty 50

## 2017-03-14 MED ORDER — VITAMIN D 1000 UNITS PO TABS: 1000.0000 [IU] | ORAL_TABLET | Freq: Every day | ORAL | Status: DC

## 2017-03-14 MED ORDER — WARFARIN SODIUM 2.5 MG PO TABS
2.5000 mg | ORAL_TABLET | Freq: Every evening | ORAL | Status: DC
  Administered 2017-03-14: 2.5 mg via ORAL
  Filled 2017-03-14: qty 1

## 2017-03-14 MED ORDER — DIPHENHYDRAMINE HCL (SLEEP) 50 MG PO CAPS: 50.0000 mg | ORAL_CAPSULE | Freq: Every day | ORAL | Status: DC

## 2017-03-14 MED ORDER — IOPAMIDOL (ISOVUE-370) INJECTION 76%
INTRAVENOUS | Status: DC | PRN
  Administered 2017-03-14: 10 mL via INTRAVENOUS

## 2017-03-14 MED ORDER — MUPIROCIN 2 % EX OINT
TOPICAL_OINTMENT | CUTANEOUS | Status: AC
  Administered 2017-03-14: 1 via TOPICAL
  Filled 2017-03-14: qty 22

## 2017-03-14 MED ORDER — ACETAMINOPHEN 325 MG PO TABS
325.0000 mg | ORAL_TABLET | ORAL | Status: DC | PRN
  Administered 2017-03-15: 650 mg via ORAL
  Filled 2017-03-14: qty 2

## 2017-03-14 MED ORDER — MUPIROCIN 2 % EX OINT
1.0000 "application " | TOPICAL_OINTMENT | Freq: Once | CUTANEOUS | Status: AC
  Administered 2017-03-14: 1 via TOPICAL

## 2017-03-14 MED ORDER — DOCUSATE SODIUM 50 MG PO CAPS
50.0000 mg | ORAL_CAPSULE | Freq: Every day | ORAL | Status: DC
  Filled 2017-03-14: qty 1

## 2017-03-14 MED ORDER — AMLODIPINE BESYLATE 2.5 MG PO TABS: 2.5000 mg | ORAL_TABLET | Freq: Every evening | ORAL | Status: DC

## 2017-03-14 MED ORDER — FENTANYL CITRATE (PF) 100 MCG/2ML IJ SOLN
INTRAMUSCULAR | Status: DC | PRN
  Administered 2017-03-14: 12.5 ug via INTRAVENOUS
  Administered 2017-03-14: 25 ug via INTRAVENOUS
  Administered 2017-03-14 (×2): 12.5 ug via INTRAVENOUS

## 2017-03-14 MED ORDER — HEPARIN (PORCINE) IN NACL 2-0.9 UNIT/ML-% IJ SOLN
INTRAMUSCULAR | Status: AC
  Filled 2017-03-14: qty 500

## 2017-03-14 MED ORDER — DIPHENHYDRAMINE HCL 25 MG PO CAPS
50.0000 mg | ORAL_CAPSULE | Freq: Every day | ORAL | Status: DC
  Administered 2017-03-14: 50 mg via ORAL
  Filled 2017-03-14: qty 2

## 2017-03-14 MED ORDER — CEFAZOLIN SODIUM-DEXTROSE 2-4 GM/100ML-% IV SOLN
INTRAVENOUS | Status: AC
  Filled 2017-03-14: qty 100

## 2017-03-14 MED ORDER — MIDAZOLAM HCL 5 MG/5ML IJ SOLN
INTRAMUSCULAR | Status: AC
  Filled 2017-03-14: qty 5

## 2017-03-14 MED ORDER — HEPARIN (PORCINE) IN NACL 2-0.9 UNIT/ML-% IJ SOLN
INTRAMUSCULAR | Status: DC | PRN
  Administered 2017-03-14: 500 mL

## 2017-03-14 SURGICAL SUPPLY — 14 items
ALLURE CRT PM3262 (Pacemaker) ×2 IMPLANT
CABLE SURGICAL S-101-97-12 (CABLE) ×1 IMPLANT
CATH CPS DIRECT 135 DS2C020 (CATHETERS) ×1 IMPLANT
CATH HEX JOSEPH 2-5-2 65CM 6F (CATHETERS) ×1 IMPLANT
CPS IMPLANT KIT 410190 (MISCELLANEOUS) ×1 IMPLANT
LEAD QUARTET 1458QL-86 (Lead) IMPLANT
LEAD TENDRIL MRI 52CM LPA1200M (Lead) ×1 IMPLANT
LEAD TENDRIL MRI 58CM LPA1200M (Lead) ×1 IMPLANT
PACEMAKER ALLURE CRT (Pacemaker) IMPLANT
PAD DEFIB LIFELINK (PAD) ×1 IMPLANT
QUARTET 1458QL-86 (Lead) ×2 IMPLANT
SHEATH CLASSIC 8F (SHEATH) ×2 IMPLANT
TRAY PACEMAKER INSERTION (PACKS) ×1 IMPLANT
WIRE ACUITY WHISPER EDS 4648 (WIRE) ×1 IMPLANT

## 2017-03-14 NOTE — Interval H&P Note (Signed)
History and Physical Interval Note:  03/14/2017 3:07 PM  [name]  has presented today for surgery, with the diagnosis of av block  The various methods of treatment have been discussed with the patient and family. After consideration of risks, benefits and other options for treatment, the patient has consented to  Procedure(s): BIV PACEMAKER INSERTION CRT-P (N/A) as a surgical intervention .  The patient's history has been reviewed, patient examined, no change in status, stable for surgery.  I have reviewed the patient's chart and labs.  Questions were answered to the patient's satisfaction.     Cristopher Peru

## 2017-03-14 NOTE — Discharge Instructions (Signed)
° ° °  Supplemental Discharge Instructions for  Pacemaker/Defibrillator Patients  Activity No heavy lifting or vigorous activity with your left/right arm for 6 to 8 weeks.  Do not raise your left/right arm above your head for one week.  Gradually raise your affected arm as drawn below.             02/15/17                     02/16/17                    02/17/17                    02/18/17 __  NO DRIVING for  1 week   ; you may begin driving on   7/79/39 .  WOUND CARE - Keep the wound area clean and dry.  Do not get this area wet for one week. No showers for one week; you may shower on  02/18/17  . - The tape/steri-strips on your wound will fall off; do not pull them off.  No bandage is needed on the site.  DO  NOT apply any creams, oils, or ointments to the wound area. - If you notice any drainage or discharge from the wound, any swelling or bruising at the site, or you develop a fever > 101? F after you are discharged home, call the office at once.  Special Instructions - You are still able to use cellular telephones; use the ear opposite the side where you have your pacemaker/defibrillator.  Avoid carrying your cellular phone near your device. - When traveling through airports, show security personnel your identification card to avoid being screened in the metal detectors.  Ask the security personnel to use the hand wand. - Avoid arc welding equipment, MRI testing (magnetic resonance imaging), TENS units (transcutaneous nerve stimulators).  Call the office for questions about other devices. - Avoid electrical appliances that are in poor condition or are not properly grounded. - Microwave ovens are safe to be near or to operate.  Additional information for defibrillator patients should your device go off: - If your device goes off ONCE and you feel fine afterward, notify the device clinic nurses. - If your device goes off ONCE and you do not feel well afterward, call 911. - If your device goes  off TWICE, call 911. - If your device goes off THREE times in one day, call 911.  DO NOT DRIVE YOURSELF OR A FAMILY MEMBER WITH A DEFIBRILLATOR TO THE HOSPITAL--CALL 911.

## 2017-03-14 NOTE — Progress Notes (Signed)
PHARMACIST - PHYSICIAN ORDER COMMUNICATION  CONCERNING: P&T Medication Policy on Herbal Medications  DESCRIPTION:  This patient's order for: Biotin 2500 mcg  has been noted.  This product(s) is classified as an "herbal" or natural product. Due to a lack of definitive safety studies or FDA approval, nonstandard manufacturing practices, plus the potential risk of unknown drug-drug interactions while on inpatient medications, the Pharmacy and Therapeutics Committee does not permit the use of "herbal" or natural products of this type within [facility].   ACTION TAKEN: The pharmacy department is unable to verify this order at this time and your patient has been informed of this safety policy. Please reevaluate patient's clinical condition at discharge and address if the herbal or natural product(s) should be resumed at that time.

## 2017-03-14 NOTE — Discharge Summary (Signed)
ELECTROPHYSIOLOGY PROCEDURE DISCHARGE SUMMARY    Patient ID: [name],  MRN: [id number], DOB/AGE: 82-Oct-1933 82 y.o.  Admit date: 03/14/2017 Discharge date: 03/15/17  Primary Care Physician: Shirline Frees, MD  Primary Cardiologist: Dr. Wynonia Lawman Electrophysiologist: Dr. Lovena Le  Primary Discharge Diagnosis:  1. CHB 2. Cardiomyopathy  Secondary Discharge Diagnosis:  1. PAFib     CHA2DS2Vasc is 5, on warfarin, monitored and managed by Dr. Thurman Coyer office 2. CAD 3. HTN 4. HLD  Allergies  Allergen Reactions  . Darifenacin Nausea Only    dry mouth and dizziness ENABLEX     Procedures This Admission:  1.  Implantation of a SJM CRT-P on 03/14/17 by Dr Lovena Le.  The patient received a There were no immediate post procedure complications. [place] (serial number [id number]) right atrial lead and a [employer] (serial number [id number]) right ventricular lead, [employer](serial number [id number]) lead (LV), [employer] (serial number [id number]) pacemaker 2.  CXR on 03/15/17 demonstrated no pneumothorax status post device implantation.   Brief HPI: [name] is a 84 y.o. male was referred to electrophysiology in the outpatient setting for consideration of PPM implantation.  Past medical is noted above.  Risks, benefits, and alternatives to PPM implantation were reviewed with the patient who wished to proceed.   Hospital Course:  The patient was admitted and underwent implantation of a CRT-P with details as outlined above. He  was monitored on telemetry overnight which demonstrated SR/V paced.  Left chest was without hematoma or ecchymosis.  The device was interrogated and found to be functioning normally.  CXR was obtained and demonstrated no pneumothorax status post device implantation.  Wound care, arm mobility, and restrictions were reviewed with the patient.  The patient was examined by Dr.  Lovena Le and considered stable for discharge to home.    Physical  Exam: Vitals:   03/14/17 2005 03/15/17 0317 03/15/17 0558 03/15/17 0800  BP: (!) 150/63 (!) 161/73 140/75 140/71  Pulse: (!) 59 64  74  Resp: 19 (!) 24 (!) 21 (!) 23  Temp:  97.8 F (36.6 C)  97.6 F (36.4 C)  TempSrc:  Oral  Oral  SpO2: 96% 94%  95%  Weight:  238 lb 1.6 oz (108 kg)    Height:        GEN- The patient is well appearing, alert and oriented x 3 today.   HEENT: normocephalic, atraumatic; sclera clear, conjunctiva pink; hearing intact; oropharynx clear; neck supple, no JVP Lungs-  CTA b/l, normal work of breathing.  No wheezes, rales, rhonchi Heart- RRR, no murmurs, rubs or gallops, PMI not laterally displaced GI- soft, non-tender, non-distended Extremities- no clubbing, cyanosis, or edema MS- no significant deformity or atrophy Skin- warm and dry, no rash or lesion, left chest without hematoma/ecchymosis Psych- euthymic mood, full affect Neuro- no gross deficits   Labs:   Lab Results  Component Value Date   WBC 6.1 03/09/2017   HGB 15.4 03/09/2017   HCT 46.9 03/09/2017   MCV 84 03/09/2017   PLT 229 03/09/2017    No results for input(s): NA, K, CL, CO2, BUN, CREATININE, CALCIUM, PROT, BILITOT, ALKPHOS, ALT, AST, GLUCOSE in the last 168 hours.  Invalid input(s): LABALBU  Discharge Medications:  Allergies as of 03/15/2017      Reactions   Darifenacin Nausea Only   dry mouth and dizziness ENABLEX      Medication List    TAKE these medications   ADULT GUMMY PO Take 1  tablet by mouth daily. Notes to patient:  Supplement    amLODipine 2.5 MG tablet Commonly known as:  NORVASC Take 2.5 mg by mouth every evening. Notes to patient:  Lowers blood pressure  Decreases chest pain    Biotin 2500 MCG Caps Take 2,500 mcg by mouth daily. Notes to patient:  Supplement    cholecalciferol 1000 units tablet Commonly known as:  VITAMIN D Take 1,000 Units by mouth daily. Notes to patient:  Supplement    docusate sodium 50 MG capsule Commonly known as:   COLACE Take 50 mg by mouth daily. Notes to patient:  Stool softner    esomeprazole 40 MG capsule Commonly known as:  NEXIUM Take 40 mg by mouth daily before breakfast. Notes to patient:  Decreases acid reflux   fenofibrate 160 MG tablet Take 1 tablet (160 mg total) by mouth daily. What changed:  when to take this Notes to patient:  Lowers triglycerides   Fish Oil 1000 MG Caps Take 1,000 mg by mouth daily. Notes to patient:  Supplement  Lowers triglycerides and cholesterol    GLUCOSAMINE PO Take 2,000 mg by mouth daily. Notes to patient:  Supplement    nitroGLYCERIN 0.4 MG SL tablet Commonly known as:  NITROSTAT Place 1 tablet (0.4 mg total) under the tongue every 5 (five) minutes as needed. What changed:  reasons to take this Notes to patient:  As needed for chest pain    pravastatin 20 MG tablet Commonly known as:  PRAVACHOL TAKE 1 TABLET BY MOUTH EVERY DAY What changed:    how much to take  how to take this  when to take this Notes to patient:  Lowers cholesterol    UNISOM SLEEPGELS 50 MG Caps Generic drug:  DiphenhydrAMINE HCl (Sleep) Take 50 mg by mouth at bedtime. Notes to patient:  Sleeping aid   vitamin B-12 500 MCG tablet Commonly known as:  CYANOCOBALAMIN Take 500 mcg by mouth daily. Notes to patient:  Supplement    vitamin C 1000 MG tablet Take 1,000 mg by mouth daily. Notes to patient:  Supplement    warfarin 2.5 MG tablet Commonly known as:  COUMADIN Take 2.5 mg by mouth every evening. Notes to patient:  Prevents pulmonary embolism  Prevents deep vein thrombosis        Disposition:  Home  Discharge Instructions    Diet - low sodium heart healthy   Complete by:  As directed    Increase activity slowly   Complete by:  As directed      Follow-up Information    [facility] Follow up on 03/26/2017.   Specialty:  Cardiology Why:  12:00PM (noon), wound check visit Contact information: [address]  [place]       Evans Lance, MD Follow up on 06/11/2017.   Specialty:  Cardiology Why:  11:30AM Contact information: [address] [address] [phone]        Jacolyn Reedy, MD Follow up.   Specialty:  Cardiology Why:  call for coumadin lab/INR check to be done by Thursday 03/22/16  Contact information: [place] [address] [phone]           Duration of Discharge Encounter: Greater than 30 minutes including physician time.  Venetia Night, PA-C 03/16/2017 3:33 PM   EP Attending  Patient seen and examined. Agree with above. He is stable for DC.  Mikle Bosworth.D.

## 2017-03-15 ENCOUNTER — Encounter (HOSPITAL_COMMUNITY): Payer: Self-pay | Admitting: Internal Medicine

## 2017-03-15 ENCOUNTER — Telehealth: Payer: Self-pay | Admitting: Surgery

## 2017-03-15 ENCOUNTER — Ambulatory Visit (HOSPITAL_COMMUNITY): Payer: Medicare Other

## 2017-03-15 DIAGNOSIS — I509 Heart failure, unspecified: Secondary | ICD-10-CM | POA: Diagnosis not present

## 2017-03-15 DIAGNOSIS — I130 Hypertensive heart and chronic kidney disease with heart failure and stage 1 through stage 4 chronic kidney disease, or unspecified chronic kidney disease: Secondary | ICD-10-CM | POA: Diagnosis not present

## 2017-03-15 DIAGNOSIS — Z950 Presence of cardiac pacemaker: Secondary | ICD-10-CM | POA: Diagnosis not present

## 2017-03-15 DIAGNOSIS — I2510 Atherosclerotic heart disease of native coronary artery without angina pectoris: Secondary | ICD-10-CM | POA: Diagnosis not present

## 2017-03-15 DIAGNOSIS — I480 Paroxysmal atrial fibrillation: Secondary | ICD-10-CM | POA: Diagnosis not present

## 2017-03-15 DIAGNOSIS — E782 Mixed hyperlipidemia: Secondary | ICD-10-CM | POA: Diagnosis not present

## 2017-03-15 DIAGNOSIS — K219 Gastro-esophageal reflux disease without esophagitis: Secondary | ICD-10-CM | POA: Diagnosis not present

## 2017-03-15 DIAGNOSIS — Z7901 Long term (current) use of anticoagulants: Secondary | ICD-10-CM | POA: Diagnosis not present

## 2017-03-15 DIAGNOSIS — I428 Other cardiomyopathies: Secondary | ICD-10-CM | POA: Diagnosis not present

## 2017-03-15 DIAGNOSIS — I441 Atrioventricular block, second degree: Secondary | ICD-10-CM | POA: Diagnosis not present

## 2017-03-15 DIAGNOSIS — I252 Old myocardial infarction: Secondary | ICD-10-CM | POA: Diagnosis not present

## 2017-03-15 DIAGNOSIS — I350 Nonrheumatic aortic (valve) stenosis: Secondary | ICD-10-CM | POA: Diagnosis not present

## 2017-03-15 DIAGNOSIS — N183 Chronic kidney disease, stage 3 (moderate): Secondary | ICD-10-CM | POA: Diagnosis not present

## 2017-03-15 MED FILL — Lidocaine HCl Local Inj 1%: INTRAMUSCULAR | Qty: 20 | Status: AC

## 2017-03-15 NOTE — Telephone Encounter (Signed)
-----   Message from Mena Goes, RN sent at 03/15/2017 12:05 AM EST ----- Regarding: FW: IVC fliter   appt with any MD, 4-6 weeks   ----- Message ----- From: Serafina Mitchell, MD Sent: 03/14/2017   7:58 PM To: [facility] Charge Pool Subject: FW: IVC fliter                                 Patient has questions about his IVC filter that Drucie Opitz placed in 2006.  Can we get him in to see any provider within 4-6 weeks to discuss his concerns.  See below.  thanks ----- Message ----- From: Jacolyn Reedy, MD Sent: 03/14/2017   7:34 PM To: Serafina Mitchell, MD Subject: IVC fliter                                     Wells,  I think I spoke to you or if not you one of your partners. I just found out that he has an IVC filter put in 2006 by Drucie Opitz and is not getting lawyer letters and had a CT recently.    I did find the op note buried in [facility].   Can I get you or one of your partners to see him in consult about this.   He is assymptomatic and is already anticoagulated because of atrial fib.   Thanks,  [place]

## 2017-03-15 NOTE — Telephone Encounter (Signed)
Sched appt 04/09/17 at 1:00 w/ [facility]. Lm on hm# to inform pt of appt and mailed pt the new pt paper work.

## 2017-03-23 DIAGNOSIS — N183 Chronic kidney disease, stage 3 (moderate): Secondary | ICD-10-CM | POA: Diagnosis not present

## 2017-03-23 DIAGNOSIS — Z955 Presence of coronary angioplasty implant and graft: Secondary | ICD-10-CM | POA: Diagnosis not present

## 2017-03-23 DIAGNOSIS — Z8546 Personal history of malignant neoplasm of prostate: Secondary | ICD-10-CM | POA: Diagnosis not present

## 2017-03-23 DIAGNOSIS — E668 Other obesity: Secondary | ICD-10-CM | POA: Diagnosis not present

## 2017-03-23 DIAGNOSIS — I350 Nonrheumatic aortic (valve) stenosis: Secondary | ICD-10-CM | POA: Diagnosis not present

## 2017-03-23 DIAGNOSIS — I480 Paroxysmal atrial fibrillation: Secondary | ICD-10-CM | POA: Diagnosis not present

## 2017-03-23 DIAGNOSIS — I5022 Chronic systolic (congestive) heart failure: Secondary | ICD-10-CM | POA: Diagnosis not present

## 2017-03-23 DIAGNOSIS — I2510 Atherosclerotic heart disease of native coronary artery without angina pectoris: Secondary | ICD-10-CM | POA: Diagnosis not present

## 2017-03-23 DIAGNOSIS — I119 Hypertensive heart disease without heart failure: Secondary | ICD-10-CM | POA: Diagnosis not present

## 2017-03-23 DIAGNOSIS — I429 Cardiomyopathy, unspecified: Secondary | ICD-10-CM | POA: Diagnosis not present

## 2017-03-23 DIAGNOSIS — Z7901 Long term (current) use of anticoagulants: Secondary | ICD-10-CM | POA: Diagnosis not present

## 2017-03-23 DIAGNOSIS — E7849 Other hyperlipidemia: Secondary | ICD-10-CM | POA: Diagnosis not present

## 2017-03-26 ENCOUNTER — Ambulatory Visit (INDEPENDENT_AMBULATORY_CARE_PROVIDER_SITE_OTHER): Payer: Medicare Other | Admitting: *Deleted

## 2017-03-26 DIAGNOSIS — I441 Atrioventricular block, second degree: Secondary | ICD-10-CM

## 2017-03-26 LAB — CUP PACEART INCLINIC DEVICE CHECK
Battery Voltage: 3.08 V
Brady Statistic RA Percent Paced: 51 %
Date Time Interrogation Session: 20190218123531
Implantable Lead Implant Date: 20190206
Implantable Lead Location: 753860
Implantable Pulse Generator Implant Date: 20190206
Lead Channel Impedance Value: 512.5 Ohm
Lead Channel Impedance Value: 637.5 Ohm
Lead Channel Pacing Threshold Amplitude: 0.5 V
Lead Channel Pacing Threshold Amplitude: 0.5 V
Lead Channel Pacing Threshold Amplitude: 1.5 V
Lead Channel Pacing Threshold Amplitude: 1.5 V
Lead Channel Pacing Threshold Pulse Width: 0.5 ms
Lead Channel Pacing Threshold Pulse Width: 0.5 ms
Lead Channel Pacing Threshold Pulse Width: 0.5 ms
Lead Channel Pacing Threshold Pulse Width: 0.8 ms
Lead Channel Sensing Intrinsic Amplitude: 12 mV
Lead Channel Setting Pacing Amplitude: 1.5 V
Lead Channel Setting Pacing Amplitude: 2 V
Lead Channel Setting Pacing Pulse Width: 0.5 ms
Lead Channel Setting Pacing Pulse Width: 0.8 ms
Lead Channel Setting Sensing Sensitivity: 2 mV
MDC IDC LEAD IMPLANT DT: 20190206
MDC IDC LEAD IMPLANT DT: 20190206
MDC IDC LEAD LOCATION: 753858
MDC IDC LEAD LOCATION: 753859
MDC IDC MSMT BATTERY REMAINING LONGEVITY: 62 mo
MDC IDC MSMT LEADCHNL LV IMPEDANCE VALUE: 487.5 Ohm
MDC IDC MSMT LEADCHNL LV PACING THRESHOLD PULSEWIDTH: 0.8 ms
MDC IDC MSMT LEADCHNL RA PACING THRESHOLD AMPLITUDE: 0.5 V
MDC IDC MSMT LEADCHNL RA SENSING INTR AMPL: 5 mV
MDC IDC MSMT LEADCHNL RV PACING THRESHOLD AMPLITUDE: 0.5 V
MDC IDC MSMT LEADCHNL RV PACING THRESHOLD PULSEWIDTH: 0.5 ms
MDC IDC SET LEADCHNL LV PACING AMPLITUDE: 2.5 V
MDC IDC STAT BRADY RV PERCENT PACED: 99 %
Pulse Gen Model: 3262
Pulse Gen Serial Number: 8995812

## 2017-03-26 NOTE — Progress Notes (Signed)
Wound check appointment. Steri-strips removed. Wound without redness or edema. Incision edges approximated, wound well healed. Normal device function. Thresholds, sensing, and impedances consistent with implant measurements. Bi-Vp 99% Device programmed at capture programmed on for extra safety margin until 3 month visit. Histogram distribution appropriate for patient and level of activity. 1 AMS<30sec. No high ventricular rates noted. Patient educated about wound care, arm mobility, lifting restrictions. ROV 06/11/2017

## 2017-04-09 ENCOUNTER — Ambulatory Visit (INDEPENDENT_AMBULATORY_CARE_PROVIDER_SITE_OTHER): Payer: Medicare Other | Admitting: Vascular Surgery

## 2017-04-09 ENCOUNTER — Encounter: Payer: Self-pay | Admitting: Vascular Surgery

## 2017-04-09 VITALS — BP 124/82 | HR 66 | Temp 98.7°F | Resp 20 | Ht 72.0 in | Wt 244.0 lb

## 2017-04-09 DIAGNOSIS — Z95828 Presence of other vascular implants and grafts: Secondary | ICD-10-CM | POA: Diagnosis not present

## 2017-04-09 DIAGNOSIS — R600 Localized edema: Secondary | ICD-10-CM

## 2017-04-09 NOTE — Progress Notes (Addendum)
Requested by:  Shirline Frees, MD [place],  [address]  Reason for consultation: IVC filter evaluation   History of Present Illness   [name] is a 84 y.o. (08/05/1931) male who presents to clinic for evaluation of IVC filter.  IVC filter was placed in 2006 after patient developed DVT after having had prostatectomy for prostate cancer.  Patient states that he and his wife were watching television several months ago when they saw a legal commercial about the risk of IVC filters.  Patient contacted a legal office who recommended CT scan to evaluate the IVC filter.  CT scan demonstrated a patent IVC filter with a portion of struts outside of the vena cava lumen.  Patient does have some mild edema of bilateral lower extremities however this does not cause much discomfort to the patient and it is controlled with a diuretic.  Patient states that he would not like any more surgery in his lifetime however does want a medical doctor's opinion on whether or not his IVC filter should be removed.  Patient is taking Coumadin prescribed by cardiology.  He denies tobacco use.  Patient also with history of PE in 2013 prior to anticoagulation use.  Past Medical History:  Diagnosis Date  . Aortic stenosis    a. Echo (06/2013): Severe LVH, EF 55-60%, mild to moderate AS (mean 15 mm Hg), mild AI, mild LAE  . Arthritis    "knees, right shoulder" (03/14/2017)  . Atrial fibrillation ([place])    Dx 2016, placed on amiodarone  . AV block   . CAD (coronary artery disease), native coronary artery    Cath 2011 [facility] (08/2009):~ Proximal LAD 30%, mid to distal LAD 25%, ostial small D1 75% mid AV groove circumflex 99% been subtotal stenosis, proximal to mid RCA 25-30%, mid RCA 30%, mid PDA 30%, EF 50% with inferior hypokinesis.;    July 2011  PCI and DES to circumflex Dr. Olevia Perches   ETT-Myoview (06/2013):  Inferolateral scar, mild peri-infarct ischemia, EF 40%; Intermediate Risk   ECHO EF  55% 03/2013   . CKD (chronic kidney disease), stage III ([place])   . GERD   . Heart murmur   . High cholesterol   . History of hiatal hernia 1970s?  Marland Kitchen History of infection of prosthetic left knee joint    Treated with debridement and irrigation followed by 6 months of triple antibiotics   . History of kidney stones   . History of peptic ulcer 1970s?  Marland Kitchen History of prostate cancer    Radical prostatectomy in 2006 Dr. Terance Hart   . History of pulmonary embolism    bilateral PE in 2013 s/p Greenfield filter  . Hypertensive heart disease   . Mixed hyperlipidemia   . Myocardial infarction ([place]) 08/2009  . Obesity (BMI 30-39.9)   . OSA on CPAP   . 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)    Initial DVT in 2006 after prostate surgery and had Greenfield filter placed Bilateral  PE 2013 and placed back on warfarin DVT of right subclavian vein on doppler 10/2012 at time of knee infection   . Presence of permanent cardiac pacemaker 03/14/2017  . Urinary incontinence    MULTIPLE BLADDER SURGERIES - STATES NO URINARY SPHINCTER - PT'S UROLOGIST IS AT [facility]- DR. PETERSON  ( LAST VISIT WAS 09/15/11 )    Past Surgical History:  Procedure Laterality Date  . APPENDECTOMY    . BI-VENTRICULAR  PACEMAKER INSERTION (CRT-P)  03/14/2017  . BIV PACEMAKER INSERTION CRT-P N/A 03/14/2017   Procedure: BIV PACEMAKER INSERTION CRT-P;  Surgeon: Evans Lance, MD;  Location: [facility];  Service: Cardiovascular;  Laterality: N/A;  . CARDIOVERSION N/A 10/08/2014   Procedure: CARDIOVERSION;  Surgeon: Jacolyn Reedy, MD;  Location: [facility];  Service: Cardiovascular;  Laterality: N/A;  . CATARACT EXTRACTION W/ INTRAOCULAR LENS  IMPLANT, BILATERAL Bilateral   . CORONARY ANGIOPLASTY WITH STENT PLACEMENT  08/25/2009   DES-mid LCx 08/2009; 30% pLAD, 25% m/dLAD, 75% ostial D1, 99% mLCx s/p DES, 25-30% p/mRCA, 40% mRCA, 30% mPDA stenoses; LVEF 50%, inf hypokinesis  . EXCISIONAL  HEMORRHOIDECTOMY    . I&D KNEE WITH POLY EXCHANGE Left 10/09/2012   Procedure: IRRIGATION AND DEBRIDEMENT LEFT KNEE WITH POLY REVISION;  Surgeon: Gearlean Alf, MD;  Location: [facility];  Service: Orthopedics;  Laterality: Left;  . INGUINAL HERNIA REPAIR Right   . JOINT REPLACEMENT    . KNEE ARTHROTOMY Left 10/09/2012   Procedure: LEFT KNEE ARTHROTOMY;  Surgeon: Gearlean Alf, MD;  Location: [facility];  Service: Orthopedics;  Laterality: Left;  . PROSTATECTOMY  04/25/2004  . REPLACEMENT TOTAL KNEE Bilateral   . SKIN BIOPSY     "off nose; wasn't cancer; it was tested" (03/14/2017)  . Uretheral implants     multiple for incontinence  . VENA CAVA FILTER PLACEMENT      Social History   Socioeconomic History  . Marital status: Married    Spouse name: Not on file  . Number of children: 3  . Years of education: Not on file  . Highest education level: Not on file  Social Needs  . Financial resource strain: Not on file  . Food insecurity - worry: Not on file  . Food insecurity - inability: Not on file  . Transportation needs - medical: Not on file  . Transportation needs - non-medical: Not on file  Occupational History  . Occupation: Retired Therapist, nutritional  Tobacco Use  . Smoking status: Former Smoker    Packs/day: 1.00    Years: 10.00    Pack years: 10.00    Types: Cigarettes    Last attempt to quit: 04/27/1961    Years since quitting: 55.9  . Smokeless tobacco: Never Used  Substance and Sexual Activity  . Alcohol use: No    Alcohol/week: 0.0 oz  . Drug use: No  . Sexual activity: Not on file  Other Topics Concern  . Not on file  Social History Narrative  . Not on file   History reviewed. No pertinent family history.  Current Outpatient Medications  Medication Sig Dispense Refill  . amLODipine (NORVASC) 2.5 MG tablet Take 2.5 mg by mouth every evening.     . Ascorbic Acid (VITAMIN C) 1000 MG tablet Take 1,000 mg by mouth daily.    . Biotin 2500 MCG CAPS Take 2,500 mcg by mouth  daily.    . cholecalciferol (VITAMIN D) 1000 UNITS tablet Take 1,000 Units by mouth daily.    . DiphenhydrAMINE HCl, Sleep, (UNISOM SLEEPGELS) 50 MG CAPS Take 50 mg by mouth at bedtime.    . docusate sodium (COLACE) 50 MG capsule Take 50 mg by mouth daily.    . fenofibrate 160 MG tablet Take 1 tablet (160 mg total) by mouth daily. 30 tablet 5  . Glucosamine HCl (GLUCOSAMINE PO) Take 2,000 mg by mouth daily.    . Multiple Vitamins-Minerals (ADULT GUMMY PO) Take 1 tablet by mouth  daily.    . nitroGLYCERIN (NITROSTAT) 0.4 MG SL tablet Place 1 tablet (0.4 mg total) under the tongue every 5 (five) minutes as needed. (Patient taking differently: Place 0.4 mg under the tongue every 5 (five) minutes as needed (FOR CHEST PAIN.). ) 25 tablet 2  . Omega-3 Fatty Acids (FISH OIL) 1000 MG CAPS Take 1,000 mg by mouth daily.    . pravastatin (PRAVACHOL) 20 MG tablet TAKE 1 TABLET BY MOUTH EVERY DAY (Patient taking differently: TAKE 1 TABLET BY MOUTH EVERY EVENING.) 30 tablet 7  . vitamin B-12 (CYANOCOBALAMIN) 500 MCG tablet Take 500 mcg by mouth daily.    Marland Kitchen warfarin (COUMADIN) 2.5 MG tablet Take 2.5 mg by mouth every evening.    Marland Kitchen esomeprazole (NEXIUM) 40 MG capsule Take 40 mg by mouth daily before breakfast.      No current facility-administered medications for this visit.     Allergies  Allergen Reactions  . Darifenacin Nausea Only    dry mouth and dizziness ENABLEX    REVIEW OF SYSTEMS (negative unless checked):   Cardiac:  []  Chest pain or chest pressure? []  Shortness of breath upon activity? []  Shortness of breath when lying flat? []  Irregular heart rhythm?  Vascular:  []  Pain in calf, thigh, or hip brought on by walking? []  Pain in feet at night that wakes you up from your sleep? []  Blood clot in your veins? []  Leg swelling?  Pulmonary:  []  Oxygen at home? []  Productive cough? []  Wheezing?  Neurologic:  []  Sudden weakness in arms or legs? []  Sudden numbness in arms or legs? []   Sudden onset of difficult speaking or slurred speech? []  Temporary loss of vision in one eye? []  Problems with dizziness?  Gastrointestinal:  []  Blood in stool? []  Vomited blood?  Genitourinary:  []  Burning when urinating? []  Blood in urine?  Psychiatric:  []  Major depression  Hematologic:  []  Bleeding problems? []  Problems with blood clotting?  Dermatologic:  []  Rashes or ulcers?  Constitutional:  []  Fever or chills?  Ear/Nose/Throat:  []  Change in hearing? []  Nose bleeds? []  Sore throat?  Musculoskeletal:  []  Back pain? []  Joint pain? []  Muscle pain?   Physical Examination     Vitals:   04/09/17 1252  BP: 124/82  Pulse: 66  Resp: 20  Temp: 98.7 F (37.1 C)  TempSrc: Oral  SpO2: 94%  Weight: 244 lb (110.7 kg)  Height: 6' (1.829 m)   Body mass index is 33.09 kg/m.  General Alert, O x 3, WD, NAD  Head Coffee/AT,    Ear/Nose/ Throat Hearing grossly intact, nares without erythema or drainage, oropharynx without Erythema or Exudate, Mallampati score: 3,      Neck Supple, mid-line trachea,    Pulmonary Sym exp, good B air movt,   Cardiac Regular with ectopy  Vascular Vessel Right Left  Radial Palpable Palpable  Brachial Palpable Palpable  Carotid Palpable, No Bruit Palpable, No Bruit  Aorta Not palpable N/A  Femoral Palpable Palpable  Popliteal Not palpable Not palpable             Gastro- intestinal Feet are symmetrically warm to touch; pitting edema BLE to the level of the proximal shin; no open wounds or ulcerations soft, non-distended,   Musculo- skeletal M/S 5/5 throughout  , Extremities without ischemic changes      Psychiatric Judgement intact, Mood & affect appropriate for pt's clinical situation  Dermatologic See M/S exam for extremity exam, No rashes otherwise noted  Lymphatic  Palpable lymph nodes: None     Outside Studies/Documentation   Result Impression  IMPRESSION: 1. 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 The filter resides approximately 2.4 cm caudal to the outflow of the renal veins. The filter is upright with minimal extension of the struts of the body of the filter outside  the IVC lumen. There is no contact with surrounding organs or aorta. No surrounding stranding or suggested abnormal filling defect on a noncontrast study. Of note, there is a left-sided IVC which arises from the left common iliac vein and drains into the  left renal vein. No filter is seen in the left sided IVC.   2. Cholelithiasis without active inflammation.  3. Staghorn calculus within the right kidney measuring 2.2 cm, which is surrounded by hazy stranding. There is no hydronephrosis.  4. Additional findings as detailed above.   Electronically Signed by: Shara Blazing  Result Narrative  INDICATION: Evaluate IVC and the filter  TECHNIQUE: Multiple contiguous helical slices were obtained through the abdomen. Radiation dose reduction was utilized (automated exposure control, mA or kV adjustment based on patient size, or iterative image reconstruction).  CONTRAST:No IV or oral contrast.  COMPARISON:None available.  FINDINGS:  LOWER CHEST:  Lungs: Mild interstitial scarring.  Other: Mild cardiomegaly.  ABDOMEN: Liver:Normal. Gallbladder: Calcified dependent gallstones. No active inflammation. No biliary dilatation. Spleen:Normal. Pancreas:Normal. Adrenal glands:Normal. Kidneys: Staghorn calculus is present within the right kidney measuring up to 2.2 cm. There is surrounding hazy stranding. There is no hydronephrosis or worrisome perinephric stranding. Lymphadenopathy:None. Fluid:No free fluid or fluid collection. Bowel: Visualized portions are unremarkable. Vascular: Dense atherosclerotic calcification within the aortoiliac outflow. No aneurysm. An IVC filter is in place which resides approximately 2.4 cm caudal to the outflow the renal  veins. The filter appears upright. The struts of the body of the filter  minimally extend external to the IVC wall. There is no contact with the aorta or surrounding organs. There is a duplicated left-sided IVC which drains into the left renal vein. No suggested abnormal filling defects on noncontrast study. The IVC appears  uniform in caliber without contracture.  OSSEOUS STRUCTURES: Scattered degenerative changes are present in addition to scoliosis. There is no acute osseous abnormality.  Other Result Information  Acute Interface, Incoming Rad Results - 01/27/2017 12:53 PM EST INDICATION: Evaluate IVC and the filter  TECHNIQUE: Multiple contiguous helical slices were obtained through the abdomen. Radiation dose reduction was utilized (automated exposure control, mA or kV adjustment based on patient size, or iterative image reconstruction).  CONTRAST:  No IV or oral contrast.    COMPARISON:  None available.  FINDINGS:        LOWER CHEST:          Lungs: Mild interstitial scarring.          Other: Mild cardiomegaly.  ABDOMEN: Liver:  Normal. Gallbladder: Calcified dependent gallstones. No active inflammation. No biliary dilatation. Spleen:  Normal. Pancreas:  Normal. Adrenal glands:  Normal. Kidneys: Staghorn calculus is present within the right kidney measuring up to 2.2 cm. There is surrounding hazy stranding. There is no hydronephrosis or worrisome perinephric stranding. Lymphadenopathy:  None. Fluid:  No free fluid or fluid collection. Bowel: Visualized portions are unremarkable. Vascular: Dense atherosclerotic calcification within the aortoiliac outflow. No aneurysm. An IVC filter is in place which resides approximately 2.4 cm caudal to the outflow the renal veins. The filter appears upright. The struts of the body of the filter  minimally extend  external to the IVC wall. There is no contact with the aorta or surrounding organs. There is a duplicated left-sided IVC which drains  into the left renal vein. No suggested abnormal filling defects on noncontrast study. The IVC appears  uniform in caliber without contracture.  OSSEOUS STRUCTURES: Scattered degenerative changes are present in addition to scoliosis. There is no acute osseous abnormality.   IMPRESSION: 1. 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 The filter resides approximately 2.4 cm caudal to the outflow of the renal veins. The filter is upright with minimal extension of the struts of the body of the filter outside  the IVC lumen. There is no contact with surrounding organs or aorta. No surrounding stranding or suggested abnormal filling defect on a noncontrast study. Of note, there is a left-sided IVC which arises from the left common iliac vein and drains into the  left renal vein. No filter is seen in the left sided IVC.   2. Cholelithiasis without active inflammation.  3. Staghorn calculus within the right kidney measuring 2.2 cm, which is surrounded by hazy stranding. There is no hydronephrosis.  4. Additional findings as detailed above.   Electronically Signed by: Shara Blazing      Medical Decision Making   Jahni P North is a 84 y.o. male who presents with: questions involving IVC filter   CT demonstrates patent IVC filter; small portion of struts outside IVC lumen  Clinically patient is asymptomatic as it relates to the presence of IVC filter and has not experienced any significant BLE edema  Dr. Oneida Alar discussed CT findings with the patient and educated patient on risk of filter removal vs leaving filter in place  Patient agreed to leave filter in place and will follow up prn   Dagoberto Ligas PA-C [facility] Office: [phone]  04/09/2017, 1:18 PM  History and exam findings as above.  Patient has an IVC filter which is currently not causing any complication.  He is 84 years old.  I do not believe an  intervention to remove the filter that is currently not causing any problems is warranted as the risk of his operation certainly outweighs the benefit currently.  There are some struts extraluminal.  This is probably been chronic for quite some time.  This is not that unusual of a finding with IVC filters.  I discussed with him that again I think the risk versus benefit for removing the filter is in favor of leaving the filter in place.  If he develops significant swelling or occlusion of the filter we would consider removal.  Otherwise I would not consider any further workup or intervention for this currently.  Ruta Hinds, MD [facility] Office: [phone] Pager: [phone]

## 2017-04-18 DIAGNOSIS — I2510 Atherosclerotic heart disease of native coronary artery without angina pectoris: Secondary | ICD-10-CM | POA: Diagnosis not present

## 2017-04-18 DIAGNOSIS — Z955 Presence of coronary angioplasty implant and graft: Secondary | ICD-10-CM | POA: Diagnosis not present

## 2017-04-18 DIAGNOSIS — I5022 Chronic systolic (congestive) heart failure: Secondary | ICD-10-CM | POA: Diagnosis not present

## 2017-04-18 DIAGNOSIS — I480 Paroxysmal atrial fibrillation: Secondary | ICD-10-CM | POA: Diagnosis not present

## 2017-04-18 DIAGNOSIS — E668 Other obesity: Secondary | ICD-10-CM | POA: Diagnosis not present

## 2017-04-18 DIAGNOSIS — I429 Cardiomyopathy, unspecified: Secondary | ICD-10-CM | POA: Diagnosis not present

## 2017-04-18 DIAGNOSIS — I350 Nonrheumatic aortic (valve) stenosis: Secondary | ICD-10-CM | POA: Diagnosis not present

## 2017-04-18 DIAGNOSIS — Z7901 Long term (current) use of anticoagulants: Secondary | ICD-10-CM | POA: Diagnosis not present

## 2017-04-18 DIAGNOSIS — E7849 Other hyperlipidemia: Secondary | ICD-10-CM | POA: Diagnosis not present

## 2017-04-18 DIAGNOSIS — N183 Chronic kidney disease, stage 3 (moderate): Secondary | ICD-10-CM | POA: Diagnosis not present

## 2017-04-18 DIAGNOSIS — Z8546 Personal history of malignant neoplasm of prostate: Secondary | ICD-10-CM | POA: Diagnosis not present

## 2017-04-18 DIAGNOSIS — I119 Hypertensive heart disease without heart failure: Secondary | ICD-10-CM | POA: Diagnosis not present

## 2017-05-03 ENCOUNTER — Encounter: Payer: Self-pay | Admitting: Internal Medicine

## 2017-05-03 DIAGNOSIS — E7800 Pure hypercholesterolemia, unspecified: Secondary | ICD-10-CM | POA: Diagnosis not present

## 2017-05-03 DIAGNOSIS — N183 Chronic kidney disease, stage 3 (moderate): Secondary | ICD-10-CM | POA: Diagnosis not present

## 2017-05-03 DIAGNOSIS — R600 Localized edema: Secondary | ICD-10-CM | POA: Diagnosis not present

## 2017-05-03 DIAGNOSIS — Z86711 Personal history of pulmonary embolism: Secondary | ICD-10-CM | POA: Diagnosis not present

## 2017-05-03 DIAGNOSIS — C61 Malignant neoplasm of prostate: Secondary | ICD-10-CM | POA: Diagnosis not present

## 2017-05-03 DIAGNOSIS — I4891 Unspecified atrial fibrillation: Secondary | ICD-10-CM | POA: Diagnosis not present

## 2017-05-03 DIAGNOSIS — R066 Hiccough: Secondary | ICD-10-CM | POA: Diagnosis not present

## 2017-05-03 DIAGNOSIS — I2510 Atherosclerotic heart disease of native coronary artery without angina pectoris: Secondary | ICD-10-CM | POA: Diagnosis not present

## 2017-05-03 DIAGNOSIS — I10 Essential (primary) hypertension: Secondary | ICD-10-CM | POA: Diagnosis not present

## 2017-05-03 DIAGNOSIS — Z7901 Long term (current) use of anticoagulants: Secondary | ICD-10-CM | POA: Diagnosis not present

## 2017-05-15 ENCOUNTER — Encounter: Payer: Self-pay | Admitting: Cardiology

## 2017-05-15 DIAGNOSIS — E668 Other obesity: Secondary | ICD-10-CM | POA: Diagnosis not present

## 2017-05-15 DIAGNOSIS — I119 Hypertensive heart disease without heart failure: Secondary | ICD-10-CM | POA: Diagnosis not present

## 2017-05-15 DIAGNOSIS — N183 Chronic kidney disease, stage 3 (moderate): Secondary | ICD-10-CM | POA: Diagnosis not present

## 2017-05-15 DIAGNOSIS — I5022 Chronic systolic (congestive) heart failure: Secondary | ICD-10-CM | POA: Diagnosis not present

## 2017-05-15 DIAGNOSIS — I429 Cardiomyopathy, unspecified: Secondary | ICD-10-CM | POA: Diagnosis not present

## 2017-05-15 DIAGNOSIS — I480 Paroxysmal atrial fibrillation: Secondary | ICD-10-CM | POA: Diagnosis not present

## 2017-05-15 DIAGNOSIS — Z955 Presence of coronary angioplasty implant and graft: Secondary | ICD-10-CM | POA: Diagnosis not present

## 2017-05-15 DIAGNOSIS — I2510 Atherosclerotic heart disease of native coronary artery without angina pectoris: Secondary | ICD-10-CM | POA: Diagnosis not present

## 2017-05-15 DIAGNOSIS — Z7901 Long term (current) use of anticoagulants: Secondary | ICD-10-CM | POA: Diagnosis not present

## 2017-05-15 DIAGNOSIS — Z8546 Personal history of malignant neoplasm of prostate: Secondary | ICD-10-CM | POA: Diagnosis not present

## 2017-05-15 DIAGNOSIS — I350 Nonrheumatic aortic (valve) stenosis: Secondary | ICD-10-CM | POA: Diagnosis not present

## 2017-05-15 DIAGNOSIS — E785 Hyperlipidemia, unspecified: Secondary | ICD-10-CM | POA: Diagnosis not present

## 2017-06-11 ENCOUNTER — Encounter: Payer: Medicare Other | Admitting: Internal Medicine

## 2017-06-12 ENCOUNTER — Ambulatory Visit (INDEPENDENT_AMBULATORY_CARE_PROVIDER_SITE_OTHER): Payer: Medicare Other | Admitting: Internal Medicine

## 2017-06-12 ENCOUNTER — Encounter: Payer: Self-pay | Admitting: Internal Medicine

## 2017-06-12 VITALS — BP 126/72 | HR 64 | Ht 72.0 in | Wt 248.0 lb

## 2017-06-12 DIAGNOSIS — I441 Atrioventricular block, second degree: Secondary | ICD-10-CM | POA: Diagnosis not present

## 2017-06-12 DIAGNOSIS — I480 Paroxysmal atrial fibrillation: Secondary | ICD-10-CM

## 2017-06-12 DIAGNOSIS — Z950 Presence of cardiac pacemaker: Secondary | ICD-10-CM | POA: Diagnosis not present

## 2017-06-12 DIAGNOSIS — I119 Hypertensive heart disease without heart failure: Secondary | ICD-10-CM | POA: Diagnosis not present

## 2017-06-12 LAB — CUP PACEART INCLINIC DEVICE CHECK
Battery Remaining Longevity: 72 mo
Battery Voltage: 2.99 V
Implantable Lead Implant Date: 20190206
Implantable Lead Location: 753860
Implantable Pulse Generator Implant Date: 20190206
Lead Channel Impedance Value: 637.5 Ohm
Lead Channel Pacing Threshold Amplitude: 0.5 V
Lead Channel Pacing Threshold Amplitude: 0.5 V
Lead Channel Pacing Threshold Amplitude: 2.75 V
Lead Channel Pacing Threshold Pulse Width: 0.5 ms
Lead Channel Pacing Threshold Pulse Width: 0.8 ms
Lead Channel Setting Pacing Amplitude: 1.5 V
Lead Channel Setting Pacing Amplitude: 2 V
Lead Channel Setting Pacing Amplitude: 3 V
Lead Channel Setting Pacing Pulse Width: 0.8 ms
MDC IDC LEAD IMPLANT DT: 20190206
MDC IDC LEAD IMPLANT DT: 20190206
MDC IDC LEAD LOCATION: 753858
MDC IDC LEAD LOCATION: 753859
MDC IDC MSMT LEADCHNL LV IMPEDANCE VALUE: 850 Ohm
MDC IDC MSMT LEADCHNL LV PACING THRESHOLD AMPLITUDE: 2.75 V
MDC IDC MSMT LEADCHNL LV PACING THRESHOLD PULSEWIDTH: 0.8 ms
MDC IDC MSMT LEADCHNL RA IMPEDANCE VALUE: 512.5 Ohm
MDC IDC MSMT LEADCHNL RA PACING THRESHOLD AMPLITUDE: 0.5 V
MDC IDC MSMT LEADCHNL RA PACING THRESHOLD PULSEWIDTH: 0.5 ms
MDC IDC MSMT LEADCHNL RA PACING THRESHOLD PULSEWIDTH: 0.5 ms
MDC IDC MSMT LEADCHNL RA SENSING INTR AMPL: 3.9 mV
MDC IDC MSMT LEADCHNL RV PACING THRESHOLD AMPLITUDE: 0.5 V
MDC IDC MSMT LEADCHNL RV PACING THRESHOLD PULSEWIDTH: 0.5 ms
MDC IDC MSMT LEADCHNL RV SENSING INTR AMPL: 12 mV
MDC IDC SESS DTM: 20190507102514
MDC IDC SET LEADCHNL RV PACING PULSEWIDTH: 0.5 ms
MDC IDC SET LEADCHNL RV SENSING SENSITIVITY: 2 mV
MDC IDC STAT BRADY RA PERCENT PACED: 43 %
MDC IDC STAT BRADY RV PERCENT PACED: 99 %
Pulse Gen Model: 3262
Pulse Gen Serial Number: 8995812

## 2017-06-12 NOTE — Patient Instructions (Signed)
Medication Instructions:  Your physician recommends that you continue on your current medications as directed. Please refer to the Current Medication list given to you today.  Labwork: None ordered.  Testing/Procedures: None ordered.  Follow-Up: Your physician wants you to follow-up in: 9 months with Dr. Lovena Le.   You will receive a reminder letter in the mail two months in advance. If you don't receive a letter, please call our office to schedule the follow-up appointment.  Remote monitoring is used to monitor your Pacemaker from home. This monitoring reduces the number of office visits required to check your device to one time per year. It allows [place] to keep an eye on the functioning of your device to ensure it is working properly. You are scheduled for a device check from home on 09/11/2017. You may send your transmission at any time that day. If you have a wireless device, the transmission will be sent automatically. After your physician reviews your transmission, you will receive a postcard with your next transmission date.  Any Other Special Instructions Will Be Listed Below (If Applicable).  If you need a refill on your cardiac medications before your next appointment, please call your pharmacy.

## 2017-06-12 NOTE — Progress Notes (Signed)
HPI Mr. [name] returns today for followup of high grade heart block, and LV dysfunction s/p biv PPM insertion. In the interim, he has done well but admits to dietary indiscretion and has gained 12 lbs. He states that he was concerned that he might dislodge his PM leads and became more sedentary. Allergies  Allergen Reactions  . Darifenacin Nausea Only    dry mouth and dizziness ENABLEX     Current Outpatient Medications  Medication Sig Dispense Refill  . amLODipine (NORVASC) 2.5 MG tablet Take 2.5 mg by mouth every evening.     . Ascorbic Acid (VITAMIN C) 1000 MG tablet Take 1,000 mg by mouth daily.    . Biotin 2500 MCG CAPS Take 2,500 mcg by mouth daily.    . cholecalciferol (VITAMIN D) 1000 UNITS tablet Take 1,000 Units by mouth daily.    . DiphenhydrAMINE HCl, Sleep, (UNISOM SLEEPGELS) 50 MG CAPS Take 50 mg by mouth at bedtime.    . docusate sodium (COLACE) 50 MG capsule Take 50 mg by mouth daily.    Marland Kitchen esomeprazole (NEXIUM) 40 MG capsule Take 40 mg by mouth daily before breakfast.     . fenofibrate 160 MG tablet Take 1 tablet (160 mg total) by mouth daily. 30 tablet 5  . Glucosamine HCl (GLUCOSAMINE PO) Take 2,000 mg by mouth daily.    . Multiple Vitamins-Minerals (ADULT GUMMY PO) Take 1 tablet by mouth daily.    . nitroGLYCERIN (NITROSTAT) 0.4 MG SL tablet Place 1 tablet (0.4 mg total) under the tongue every 5 (five) minutes as needed. (Patient taking differently: Place 0.4 mg under the tongue every 5 (five) minutes as needed (FOR CHEST PAIN.). ) 25 tablet 2  . Omega-3 Fatty Acids (FISH OIL) 1000 MG CAPS Take 1,000 mg by mouth daily.    . pravastatin (PRAVACHOL) 20 MG tablet TAKE 1 TABLET BY MOUTH EVERY DAY (Patient taking differently: TAKE 1 TABLET BY MOUTH EVERY EVENING.) 30 tablet 7  . vitamin B-12 (CYANOCOBALAMIN) 500 MCG tablet Take 500 mcg by mouth daily.    Marland Kitchen warfarin (COUMADIN) 2.5 MG tablet Take 2.5 mg by mouth every evening.     No current facility-administered  medications for this visit.      Past Medical History:  Diagnosis Date  . Aortic stenosis    a. Echo (06/2013): Severe LVH, EF 55-60%, mild to moderate AS (mean 15 mm Hg), mild AI, mild LAE  . Arthritis    "knees, right shoulder" (03/14/2017)  . Atrial fibrillation ([place])    Dx 2016, placed on amiodarone  . AV block   . CAD (coronary artery disease), native coronary artery    Cath 2011 LHC (08/2009):~ Proximal LAD 30%, mid to distal LAD 25%, ostial small D1 75% mid AV groove circumflex 99% been subtotal stenosis, proximal to mid RCA 25-30%, mid RCA 30%, mid PDA 30%, EF 50% with inferior hypokinesis.;    July 2011  PCI and DES to circumflex Dr. Olevia Perches   ETT-Myoview (06/2013):  Inferolateral scar, mild peri-infarct ischemia, EF 40%; Intermediate Risk   ECHO EF 55% 03/2013   . CKD (chronic kidney disease), stage III ([place])   . GERD   . Heart murmur   . High cholesterol   . History of hiatal hernia 1970s?  Marland Kitchen History of infection of prosthetic left knee joint    Treated with debridement and irrigation followed by 6 months of triple antibiotics   . History of kidney stones   . History  of peptic ulcer 1970s?  Marland Kitchen History of prostate cancer    Radical prostatectomy in 2006 Dr. Terance Hart   . History of pulmonary embolism    bilateral PE in 2013 s/p Greenfield filter  . Hypertensive heart disease   . Mixed hyperlipidemia   . Myocardial infarction ([place]) 08/2009  . Obesity (BMI 30-39.9)   . OSA on CPAP   . 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)    Initial DVT in 2006 after prostate surgery and had Greenfield filter placed Bilateral  PE 2013 and placed back on warfarin DVT of right subclavian vein on doppler 10/2012 at time of knee infection   . Presence of permanent cardiac pacemaker 03/14/2017  . Urinary incontinence    MULTIPLE BLADDER SURGERIES - STATES NO URINARY SPHINCTER - PT'S UROLOGIST IS AT [facility]- DR. PETERSON  ( LAST VISIT WAS 09/15/11 )    ROS:   All systems  reviewed and negative except as noted in the HPI.   Past Surgical History:  Procedure Laterality Date  . APPENDECTOMY    . BI-VENTRICULAR PACEMAKER INSERTION (CRT-P)  03/14/2017  . BIV PACEMAKER INSERTION CRT-P N/A 03/14/2017   Procedure: BIV PACEMAKER INSERTION CRT-P;  Surgeon: Evans Lance, MD;  Location: [facility];  Service: Cardiovascular;  Laterality: N/A;  . CARDIOVERSION N/A 10/08/2014   Procedure: CARDIOVERSION;  Surgeon: Jacolyn Reedy, MD;  Location: [facility] ENDOSCOPY;  Service: Cardiovascular;  Laterality: N/A;  . CATARACT EXTRACTION W/ INTRAOCULAR LENS  IMPLANT, BILATERAL Bilateral   . CORONARY ANGIOPLASTY WITH STENT PLACEMENT  08/25/2009   DES-mid LCx 08/2009; 30% pLAD, 25% m/dLAD, 75% ostial D1, 99% mLCx s/p DES, 25-30% p/mRCA, 40% mRCA, 30% mPDA stenoses; LVEF 50%, inf hypokinesis  . EXCISIONAL HEMORRHOIDECTOMY    . I&D KNEE WITH POLY EXCHANGE Left 10/09/2012   Procedure: IRRIGATION AND DEBRIDEMENT LEFT KNEE WITH POLY REVISION;  Surgeon: Gearlean Alf, MD;  Location: [facility] ORS;  Service: Orthopedics;  Laterality: Left;  . INGUINAL HERNIA REPAIR Right   . JOINT REPLACEMENT    . KNEE ARTHROTOMY Left 10/09/2012   Procedure: LEFT KNEE ARTHROTOMY;  Surgeon: Gearlean Alf, MD;  Location: [facility] ORS;  Service: Orthopedics;  Laterality: Left;  . PROSTATECTOMY  04/25/2004  . REPLACEMENT TOTAL KNEE Bilateral   . SKIN BIOPSY     "off nose; wasn't cancer; it was tested" (03/14/2017)  . Uretheral implants     multiple for incontinence  . VENA CAVA FILTER PLACEMENT       No family history on file.   Social History   Socioeconomic History  . Marital status: Married    Spouse name: Not on file  . Number of children: 3  . Years of education: Not on file  . Highest education level: Not on file  Occupational History  . Occupation: Retired Therapist, nutritional  Social Needs  . Financial resource strain: Not on file  . Food insecurity:    Worry: Not on file    Inability: Not on file  .  Transportation needs:    Medical: Not on file    Non-medical: Not on file  Tobacco Use  . Smoking status: Former Smoker    Packs/day: 1.00    Years: 10.00    Pack years: 10.00    Types: Cigarettes    Last attempt to quit: 04/27/1961    Years since quitting: 56.1  . Smokeless tobacco: Never Used  Substance and Sexual Activity  . Alcohol use: No  Alcohol/week: 0.0 oz  . Drug use: No  . Sexual activity: Not on file  Lifestyle  . Physical activity:    Days per week: Not on file    Minutes per session: Not on file  . Stress: Not on file  Relationships  . Social connections:    Talks on phone: Not on file    Gets together: Not on file    Attends religious service: Not on file    Active member of club or organization: Not on file    Attends meetings of clubs or organizations: Not on file    Relationship status: Not on file  . Intimate partner violence:    Fear of current or ex partner: Not on file    Emotionally abused: Not on file    Physically abused: Not on file    Forced sexual activity: Not on file  Other Topics Concern  . Not on file  Social History Narrative  . Not on file     BP 126/72   Pulse 64   Ht 6' (1.829 m)   Wt 248 lb (112.5 kg)   BMI 33.63 kg/m   Physical Exam:  Well appearing NAD HEENT: Unremarkable Neck:  No JVD, no thyromegally Lymphatics:  No adenopathy Back:  No CVA tenderness Lungs:  Clear HEART:  Regular rate rhythm, no murmurs, no rubs, no clicks Abd:  soft, positive bowel sounds, no organomegally, no rebound, no guarding Ext:  2 plus pulses, trace edema, no cyanosis, no clubbing Skin:  No rashes no nodules Neuro:  CN II through XII intact, motor grossly intact  EKG - NSR with biv pacing  DEVICE  Normal device function.  See PaceArt for details. LV threshold is up a bit  Assess/Plan: 1. High grade heart block - he is s/p PPM and asymptomatic 2. Chronic systolic/diastolic heart failure - his symptoms are class 2. He is limited by  his weight and too much salt intake. I have asked him to do better on both. I considered adding lasix 3. Obesity - he is overweight. He states that he will try and lose 20 lbs by the time I see him in 9 months. 4. PPM - interrogation of his [employer] Jude biv PPM demonstrates normal function but LV threshold is up a bit. I have programmed him tip to can.  Mikle Bosworth.D.

## 2017-06-13 DIAGNOSIS — I2510 Atherosclerotic heart disease of native coronary artery without angina pectoris: Secondary | ICD-10-CM | POA: Diagnosis not present

## 2017-06-13 DIAGNOSIS — I429 Cardiomyopathy, unspecified: Secondary | ICD-10-CM | POA: Diagnosis not present

## 2017-06-13 DIAGNOSIS — E785 Hyperlipidemia, unspecified: Secondary | ICD-10-CM | POA: Diagnosis not present

## 2017-06-13 DIAGNOSIS — Z950 Presence of cardiac pacemaker: Secondary | ICD-10-CM | POA: Diagnosis not present

## 2017-06-13 DIAGNOSIS — I5022 Chronic systolic (congestive) heart failure: Secondary | ICD-10-CM | POA: Diagnosis not present

## 2017-06-13 DIAGNOSIS — I350 Nonrheumatic aortic (valve) stenosis: Secondary | ICD-10-CM | POA: Diagnosis not present

## 2017-06-13 DIAGNOSIS — N183 Chronic kidney disease, stage 3 (moderate): Secondary | ICD-10-CM | POA: Diagnosis not present

## 2017-06-13 DIAGNOSIS — Z7901 Long term (current) use of anticoagulants: Secondary | ICD-10-CM | POA: Diagnosis not present

## 2017-06-13 DIAGNOSIS — Z955 Presence of coronary angioplasty implant and graft: Secondary | ICD-10-CM | POA: Diagnosis not present

## 2017-06-13 DIAGNOSIS — I119 Hypertensive heart disease without heart failure: Secondary | ICD-10-CM | POA: Diagnosis not present

## 2017-06-13 DIAGNOSIS — I480 Paroxysmal atrial fibrillation: Secondary | ICD-10-CM | POA: Diagnosis not present

## 2017-06-13 DIAGNOSIS — E668 Other obesity: Secondary | ICD-10-CM | POA: Diagnosis not present

## 2017-07-11 DIAGNOSIS — I480 Paroxysmal atrial fibrillation: Secondary | ICD-10-CM | POA: Diagnosis not present

## 2017-07-11 DIAGNOSIS — I2510 Atherosclerotic heart disease of native coronary artery without angina pectoris: Secondary | ICD-10-CM | POA: Diagnosis not present

## 2017-07-11 DIAGNOSIS — N183 Chronic kidney disease, stage 3 (moderate): Secondary | ICD-10-CM | POA: Diagnosis not present

## 2017-07-11 DIAGNOSIS — I429 Cardiomyopathy, unspecified: Secondary | ICD-10-CM | POA: Diagnosis not present

## 2017-07-11 DIAGNOSIS — I119 Hypertensive heart disease without heart failure: Secondary | ICD-10-CM | POA: Diagnosis not present

## 2017-07-11 DIAGNOSIS — Z955 Presence of coronary angioplasty implant and graft: Secondary | ICD-10-CM | POA: Diagnosis not present

## 2017-07-11 DIAGNOSIS — Z7901 Long term (current) use of anticoagulants: Secondary | ICD-10-CM | POA: Diagnosis not present

## 2017-07-11 DIAGNOSIS — I5022 Chronic systolic (congestive) heart failure: Secondary | ICD-10-CM | POA: Diagnosis not present

## 2017-07-11 DIAGNOSIS — Z950 Presence of cardiac pacemaker: Secondary | ICD-10-CM | POA: Diagnosis not present

## 2017-07-11 DIAGNOSIS — E785 Hyperlipidemia, unspecified: Secondary | ICD-10-CM | POA: Diagnosis not present

## 2017-07-11 DIAGNOSIS — I350 Nonrheumatic aortic (valve) stenosis: Secondary | ICD-10-CM | POA: Diagnosis not present

## 2017-07-11 DIAGNOSIS — E668 Other obesity: Secondary | ICD-10-CM | POA: Diagnosis not present

## 2017-07-18 DIAGNOSIS — N393 Stress incontinence (female) (male): Secondary | ICD-10-CM | POA: Diagnosis not present

## 2017-07-18 DIAGNOSIS — Z8546 Personal history of malignant neoplasm of prostate: Secondary | ICD-10-CM | POA: Diagnosis not present

## 2017-07-25 DIAGNOSIS — I119 Hypertensive heart disease without heart failure: Secondary | ICD-10-CM | POA: Diagnosis not present

## 2017-07-25 DIAGNOSIS — I429 Cardiomyopathy, unspecified: Secondary | ICD-10-CM | POA: Diagnosis not present

## 2017-07-25 DIAGNOSIS — I5022 Chronic systolic (congestive) heart failure: Secondary | ICD-10-CM | POA: Diagnosis not present

## 2017-07-25 DIAGNOSIS — N183 Chronic kidney disease, stage 3 (moderate): Secondary | ICD-10-CM | POA: Diagnosis not present

## 2017-07-25 DIAGNOSIS — E668 Other obesity: Secondary | ICD-10-CM | POA: Diagnosis not present

## 2017-07-25 DIAGNOSIS — I2510 Atherosclerotic heart disease of native coronary artery without angina pectoris: Secondary | ICD-10-CM | POA: Diagnosis not present

## 2017-07-25 DIAGNOSIS — I350 Nonrheumatic aortic (valve) stenosis: Secondary | ICD-10-CM | POA: Diagnosis not present

## 2017-07-25 DIAGNOSIS — I480 Paroxysmal atrial fibrillation: Secondary | ICD-10-CM | POA: Diagnosis not present

## 2017-07-25 DIAGNOSIS — Z7901 Long term (current) use of anticoagulants: Secondary | ICD-10-CM | POA: Diagnosis not present

## 2017-07-25 DIAGNOSIS — Z950 Presence of cardiac pacemaker: Secondary | ICD-10-CM | POA: Diagnosis not present

## 2017-07-25 DIAGNOSIS — E785 Hyperlipidemia, unspecified: Secondary | ICD-10-CM | POA: Diagnosis not present

## 2017-07-25 DIAGNOSIS — Z955 Presence of coronary angioplasty implant and graft: Secondary | ICD-10-CM | POA: Diagnosis not present

## 2017-08-07 ENCOUNTER — Telehealth: Payer: Self-pay | Admitting: Internal Medicine

## 2017-08-07 DIAGNOSIS — M542 Cervicalgia: Secondary | ICD-10-CM | POA: Diagnosis not present

## 2017-08-07 DIAGNOSIS — M9902 Segmental and somatic dysfunction of thoracic region: Secondary | ICD-10-CM | POA: Diagnosis not present

## 2017-08-07 DIAGNOSIS — M62830 Muscle spasm of back: Secondary | ICD-10-CM | POA: Diagnosis not present

## 2017-08-07 DIAGNOSIS — M9901 Segmental and somatic dysfunction of cervical region: Secondary | ICD-10-CM | POA: Diagnosis not present

## 2017-08-07 DIAGNOSIS — M546 Pain in thoracic spine: Secondary | ICD-10-CM | POA: Diagnosis not present

## 2017-08-07 NOTE — Telephone Encounter (Signed)
Mr. Diekman is concerned about being adjusted at the chiropractor receiving TENS therapy to his lower back. I advised that the patches for the TENS unit be placed 10" away from his pacemaker to avoid interference. I let him know that he could be adjusted at the chiropractor as his device has been implanted since 03/14/17. He reports that he will probably avoid having it done because he's nervous about messing up his pacemaker. I let him know that he is fine to avoid something he's not comfortable with, he verbalizes understanding.

## 2017-08-07 NOTE — Telephone Encounter (Signed)
New Message    Patient is calling because he is going to the chiropractor. He is wondering since he has a pacer can he get the shock to his upper back. Please call to discuss.

## 2017-08-08 DIAGNOSIS — I350 Nonrheumatic aortic (valve) stenosis: Secondary | ICD-10-CM | POA: Diagnosis not present

## 2017-08-08 DIAGNOSIS — I2510 Atherosclerotic heart disease of native coronary artery without angina pectoris: Secondary | ICD-10-CM | POA: Diagnosis not present

## 2017-08-08 DIAGNOSIS — I429 Cardiomyopathy, unspecified: Secondary | ICD-10-CM | POA: Diagnosis not present

## 2017-08-08 DIAGNOSIS — E668 Other obesity: Secondary | ICD-10-CM | POA: Diagnosis not present

## 2017-08-08 DIAGNOSIS — Z955 Presence of coronary angioplasty implant and graft: Secondary | ICD-10-CM | POA: Diagnosis not present

## 2017-08-08 DIAGNOSIS — Z950 Presence of cardiac pacemaker: Secondary | ICD-10-CM | POA: Diagnosis not present

## 2017-08-08 DIAGNOSIS — I5022 Chronic systolic (congestive) heart failure: Secondary | ICD-10-CM | POA: Diagnosis not present

## 2017-08-08 DIAGNOSIS — I480 Paroxysmal atrial fibrillation: Secondary | ICD-10-CM | POA: Diagnosis not present

## 2017-08-08 DIAGNOSIS — Z7901 Long term (current) use of anticoagulants: Secondary | ICD-10-CM | POA: Diagnosis not present

## 2017-08-08 DIAGNOSIS — I119 Hypertensive heart disease without heart failure: Secondary | ICD-10-CM | POA: Diagnosis not present

## 2017-08-08 DIAGNOSIS — E785 Hyperlipidemia, unspecified: Secondary | ICD-10-CM | POA: Diagnosis not present

## 2017-08-08 DIAGNOSIS — N183 Chronic kidney disease, stage 3 (moderate): Secondary | ICD-10-CM | POA: Diagnosis not present

## 2017-08-15 DIAGNOSIS — M546 Pain in thoracic spine: Secondary | ICD-10-CM | POA: Diagnosis not present

## 2017-08-15 DIAGNOSIS — M62830 Muscle spasm of back: Secondary | ICD-10-CM | POA: Diagnosis not present

## 2017-08-15 DIAGNOSIS — M9902 Segmental and somatic dysfunction of thoracic region: Secondary | ICD-10-CM | POA: Diagnosis not present

## 2017-08-15 DIAGNOSIS — M9901 Segmental and somatic dysfunction of cervical region: Secondary | ICD-10-CM | POA: Diagnosis not present

## 2017-08-15 DIAGNOSIS — M542 Cervicalgia: Secondary | ICD-10-CM | POA: Diagnosis not present

## 2017-08-16 ENCOUNTER — Telehealth: Payer: Self-pay | Admitting: *Deleted

## 2017-08-16 NOTE — Telephone Encounter (Signed)
Alert received for persistent AF since 07/30/17. 99%BiVp. Controlled V rate.  Patient denies any new or worsening symptoms. Patient states that he notices an occasional "thumping" from his device with certain position changes. I explained to patient that he may be experiencing diaphragmatic stimulation d/t his LV lead. I told patient that he could come in for an appointment to have his device reprogrammed if it's very bothersome, but I assured patient that it is not dangerous. Patient verbalized understanding.  I encouraged patient to continue to take his warfarin as Rx'd and to call if any symptoms develop. Again, patient verbalized understanding.

## 2017-08-17 DIAGNOSIS — M62830 Muscle spasm of back: Secondary | ICD-10-CM | POA: Diagnosis not present

## 2017-08-17 DIAGNOSIS — M9902 Segmental and somatic dysfunction of thoracic region: Secondary | ICD-10-CM | POA: Diagnosis not present

## 2017-08-17 DIAGNOSIS — M542 Cervicalgia: Secondary | ICD-10-CM | POA: Diagnosis not present

## 2017-08-17 DIAGNOSIS — M9901 Segmental and somatic dysfunction of cervical region: Secondary | ICD-10-CM | POA: Diagnosis not present

## 2017-08-17 DIAGNOSIS — M9903 Segmental and somatic dysfunction of lumbar region: Secondary | ICD-10-CM | POA: Diagnosis not present

## 2017-08-17 DIAGNOSIS — M546 Pain in thoracic spine: Secondary | ICD-10-CM | POA: Diagnosis not present

## 2017-08-22 DIAGNOSIS — M62830 Muscle spasm of back: Secondary | ICD-10-CM | POA: Diagnosis not present

## 2017-08-22 DIAGNOSIS — M9901 Segmental and somatic dysfunction of cervical region: Secondary | ICD-10-CM | POA: Diagnosis not present

## 2017-08-22 DIAGNOSIS — M542 Cervicalgia: Secondary | ICD-10-CM | POA: Diagnosis not present

## 2017-08-22 DIAGNOSIS — M546 Pain in thoracic spine: Secondary | ICD-10-CM | POA: Diagnosis not present

## 2017-08-22 DIAGNOSIS — M9902 Segmental and somatic dysfunction of thoracic region: Secondary | ICD-10-CM | POA: Diagnosis not present

## 2017-08-22 DIAGNOSIS — M9903 Segmental and somatic dysfunction of lumbar region: Secondary | ICD-10-CM | POA: Diagnosis not present

## 2017-09-05 DIAGNOSIS — I5022 Chronic systolic (congestive) heart failure: Secondary | ICD-10-CM | POA: Diagnosis not present

## 2017-09-05 DIAGNOSIS — I350 Nonrheumatic aortic (valve) stenosis: Secondary | ICD-10-CM | POA: Diagnosis not present

## 2017-09-05 DIAGNOSIS — Z955 Presence of coronary angioplasty implant and graft: Secondary | ICD-10-CM | POA: Diagnosis not present

## 2017-09-05 DIAGNOSIS — Z950 Presence of cardiac pacemaker: Secondary | ICD-10-CM | POA: Diagnosis not present

## 2017-09-05 DIAGNOSIS — N183 Chronic kidney disease, stage 3 (moderate): Secondary | ICD-10-CM | POA: Diagnosis not present

## 2017-09-05 DIAGNOSIS — I480 Paroxysmal atrial fibrillation: Secondary | ICD-10-CM | POA: Diagnosis not present

## 2017-09-05 DIAGNOSIS — I429 Cardiomyopathy, unspecified: Secondary | ICD-10-CM | POA: Diagnosis not present

## 2017-09-05 DIAGNOSIS — E785 Hyperlipidemia, unspecified: Secondary | ICD-10-CM | POA: Diagnosis not present

## 2017-09-05 DIAGNOSIS — I2510 Atherosclerotic heart disease of native coronary artery without angina pectoris: Secondary | ICD-10-CM | POA: Diagnosis not present

## 2017-09-05 DIAGNOSIS — I119 Hypertensive heart disease without heart failure: Secondary | ICD-10-CM | POA: Diagnosis not present

## 2017-09-05 DIAGNOSIS — E668 Other obesity: Secondary | ICD-10-CM | POA: Diagnosis not present

## 2017-09-05 DIAGNOSIS — Z7901 Long term (current) use of anticoagulants: Secondary | ICD-10-CM | POA: Diagnosis not present

## 2017-09-11 ENCOUNTER — Ambulatory Visit (INDEPENDENT_AMBULATORY_CARE_PROVIDER_SITE_OTHER): Payer: Medicare Other | Admitting: *Deleted

## 2017-09-11 DIAGNOSIS — I441 Atrioventricular block, second degree: Secondary | ICD-10-CM

## 2017-09-12 NOTE — Progress Notes (Signed)
Remote pacemaker transmission.   

## 2017-10-01 DIAGNOSIS — M9902 Segmental and somatic dysfunction of thoracic region: Secondary | ICD-10-CM | POA: Diagnosis not present

## 2017-10-01 DIAGNOSIS — M62830 Muscle spasm of back: Secondary | ICD-10-CM | POA: Diagnosis not present

## 2017-10-01 DIAGNOSIS — M9903 Segmental and somatic dysfunction of lumbar region: Secondary | ICD-10-CM | POA: Diagnosis not present

## 2017-10-01 DIAGNOSIS — M542 Cervicalgia: Secondary | ICD-10-CM | POA: Diagnosis not present

## 2017-10-01 DIAGNOSIS — M546 Pain in thoracic spine: Secondary | ICD-10-CM | POA: Diagnosis not present

## 2017-10-01 DIAGNOSIS — M9901 Segmental and somatic dysfunction of cervical region: Secondary | ICD-10-CM | POA: Diagnosis not present

## 2017-10-03 DIAGNOSIS — I429 Cardiomyopathy, unspecified: Secondary | ICD-10-CM | POA: Diagnosis not present

## 2017-10-03 DIAGNOSIS — E668 Other obesity: Secondary | ICD-10-CM | POA: Diagnosis not present

## 2017-10-03 DIAGNOSIS — Z950 Presence of cardiac pacemaker: Secondary | ICD-10-CM | POA: Diagnosis not present

## 2017-10-03 DIAGNOSIS — N183 Chronic kidney disease, stage 3 (moderate): Secondary | ICD-10-CM | POA: Diagnosis not present

## 2017-10-03 DIAGNOSIS — I480 Paroxysmal atrial fibrillation: Secondary | ICD-10-CM | POA: Diagnosis not present

## 2017-10-03 DIAGNOSIS — I5022 Chronic systolic (congestive) heart failure: Secondary | ICD-10-CM | POA: Diagnosis not present

## 2017-10-03 DIAGNOSIS — I350 Nonrheumatic aortic (valve) stenosis: Secondary | ICD-10-CM | POA: Diagnosis not present

## 2017-10-03 DIAGNOSIS — I119 Hypertensive heart disease without heart failure: Secondary | ICD-10-CM | POA: Diagnosis not present

## 2017-10-03 DIAGNOSIS — I2510 Atherosclerotic heart disease of native coronary artery without angina pectoris: Secondary | ICD-10-CM | POA: Diagnosis not present

## 2017-10-03 DIAGNOSIS — Z7901 Long term (current) use of anticoagulants: Secondary | ICD-10-CM | POA: Diagnosis not present

## 2017-10-03 DIAGNOSIS — E785 Hyperlipidemia, unspecified: Secondary | ICD-10-CM | POA: Diagnosis not present

## 2017-10-03 DIAGNOSIS — Z955 Presence of coronary angioplasty implant and graft: Secondary | ICD-10-CM | POA: Diagnosis not present

## 2017-10-04 DIAGNOSIS — M9903 Segmental and somatic dysfunction of lumbar region: Secondary | ICD-10-CM | POA: Diagnosis not present

## 2017-10-04 DIAGNOSIS — M62830 Muscle spasm of back: Secondary | ICD-10-CM | POA: Diagnosis not present

## 2017-10-04 DIAGNOSIS — M9901 Segmental and somatic dysfunction of cervical region: Secondary | ICD-10-CM | POA: Diagnosis not present

## 2017-10-04 DIAGNOSIS — M546 Pain in thoracic spine: Secondary | ICD-10-CM | POA: Diagnosis not present

## 2017-10-04 DIAGNOSIS — M542 Cervicalgia: Secondary | ICD-10-CM | POA: Diagnosis not present

## 2017-10-04 DIAGNOSIS — M9902 Segmental and somatic dysfunction of thoracic region: Secondary | ICD-10-CM | POA: Diagnosis not present

## 2017-10-09 LAB — CUP PACEART REMOTE DEVICE CHECK
Battery Voltage: 2.99 V
Brady Statistic AP VS Percent: 1 %
Brady Statistic AS VP Percent: 50 %
Brady Statistic AS VS Percent: 1 %
Date Time Interrogation Session: 20190806060014
Implantable Lead Implant Date: 20190206
Implantable Lead Location: 753858
Implantable Lead Location: 753860
Lead Channel Impedance Value: 540 Ohm
Lead Channel Impedance Value: 910 Ohm
Lead Channel Pacing Threshold Amplitude: 0.375 V
Lead Channel Pacing Threshold Amplitude: 0.625 V
Lead Channel Pacing Threshold Amplitude: 2.75 V
Lead Channel Pacing Threshold Pulse Width: 0.5 ms
Lead Channel Pacing Threshold Pulse Width: 0.8 ms
Lead Channel Sensing Intrinsic Amplitude: 12 mV
Lead Channel Setting Pacing Amplitude: 1.375
MDC IDC LEAD IMPLANT DT: 20190206
MDC IDC LEAD IMPLANT DT: 20190206
MDC IDC LEAD LOCATION: 753859
MDC IDC MSMT BATTERY REMAINING LONGEVITY: 75 mo
MDC IDC MSMT BATTERY REMAINING PERCENTAGE: 95.5 %
MDC IDC MSMT LEADCHNL RA IMPEDANCE VALUE: 480 Ohm
MDC IDC MSMT LEADCHNL RA PACING THRESHOLD PULSEWIDTH: 0.5 ms
MDC IDC MSMT LEADCHNL RA SENSING INTR AMPL: 3.1 mV
MDC IDC PG IMPLANT DT: 20190206
MDC IDC PG SERIAL: 8995812
MDC IDC SET LEADCHNL LV PACING AMPLITUDE: 3 V
MDC IDC SET LEADCHNL LV PACING PULSEWIDTH: 0.8 ms
MDC IDC SET LEADCHNL RV PACING AMPLITUDE: 2 V
MDC IDC SET LEADCHNL RV PACING PULSEWIDTH: 0.5 ms
MDC IDC SET LEADCHNL RV SENSING SENSITIVITY: 2 mV
MDC IDC STAT BRADY AP VP PERCENT: 49 %
MDC IDC STAT BRADY RA PERCENT PACED: 37 %
Pulse Gen Model: 3262

## 2017-10-16 DIAGNOSIS — N183 Chronic kidney disease, stage 3 (moderate): Secondary | ICD-10-CM | POA: Diagnosis not present

## 2017-10-16 DIAGNOSIS — E7800 Pure hypercholesterolemia, unspecified: Secondary | ICD-10-CM | POA: Diagnosis not present

## 2017-10-16 DIAGNOSIS — R600 Localized edema: Secondary | ICD-10-CM | POA: Diagnosis not present

## 2017-10-16 DIAGNOSIS — I2510 Atherosclerotic heart disease of native coronary artery without angina pectoris: Secondary | ICD-10-CM | POA: Diagnosis not present

## 2017-10-16 DIAGNOSIS — I4891 Unspecified atrial fibrillation: Secondary | ICD-10-CM | POA: Diagnosis not present

## 2017-10-16 DIAGNOSIS — C61 Malignant neoplasm of prostate: Secondary | ICD-10-CM | POA: Diagnosis not present

## 2017-10-16 DIAGNOSIS — K219 Gastro-esophageal reflux disease without esophagitis: Secondary | ICD-10-CM | POA: Diagnosis not present

## 2017-10-16 DIAGNOSIS — I10 Essential (primary) hypertension: Secondary | ICD-10-CM | POA: Diagnosis not present

## 2017-10-16 DIAGNOSIS — Z7901 Long term (current) use of anticoagulants: Secondary | ICD-10-CM | POA: Diagnosis not present

## 2017-10-31 DIAGNOSIS — Z950 Presence of cardiac pacemaker: Secondary | ICD-10-CM | POA: Diagnosis not present

## 2017-10-31 DIAGNOSIS — N183 Chronic kidney disease, stage 3 (moderate): Secondary | ICD-10-CM | POA: Diagnosis not present

## 2017-10-31 DIAGNOSIS — I350 Nonrheumatic aortic (valve) stenosis: Secondary | ICD-10-CM | POA: Diagnosis not present

## 2017-10-31 DIAGNOSIS — I119 Hypertensive heart disease without heart failure: Secondary | ICD-10-CM | POA: Diagnosis not present

## 2017-10-31 DIAGNOSIS — I2510 Atherosclerotic heart disease of native coronary artery without angina pectoris: Secondary | ICD-10-CM | POA: Diagnosis not present

## 2017-10-31 DIAGNOSIS — E668 Other obesity: Secondary | ICD-10-CM | POA: Diagnosis not present

## 2017-10-31 DIAGNOSIS — E785 Hyperlipidemia, unspecified: Secondary | ICD-10-CM | POA: Diagnosis not present

## 2017-10-31 DIAGNOSIS — I429 Cardiomyopathy, unspecified: Secondary | ICD-10-CM | POA: Diagnosis not present

## 2017-10-31 DIAGNOSIS — I5022 Chronic systolic (congestive) heart failure: Secondary | ICD-10-CM | POA: Diagnosis not present

## 2017-10-31 DIAGNOSIS — I480 Paroxysmal atrial fibrillation: Secondary | ICD-10-CM | POA: Diagnosis not present

## 2017-10-31 DIAGNOSIS — Z7901 Long term (current) use of anticoagulants: Secondary | ICD-10-CM | POA: Diagnosis not present

## 2017-10-31 DIAGNOSIS — Z955 Presence of coronary angioplasty implant and graft: Secondary | ICD-10-CM | POA: Diagnosis not present

## 2017-10-31 LAB — PROTIME-INR: INR: 2.7 — AB (ref 0.9–1.1)

## 2017-11-12 DIAGNOSIS — N189 Chronic kidney disease, unspecified: Secondary | ICD-10-CM | POA: Insufficient documentation

## 2017-11-19 ENCOUNTER — Encounter: Payer: Self-pay | Admitting: Cardiology

## 2017-11-19 DIAGNOSIS — I5042 Chronic combined systolic (congestive) and diastolic (congestive) heart failure: Secondary | ICD-10-CM

## 2017-11-19 HISTORY — DX: Chronic combined systolic (congestive) and diastolic (congestive) heart failure: I50.42

## 2017-11-19 NOTE — Progress Notes (Signed)
[name], [name]    Date of visit:  05/15/2017 DOB:  Jan 25, 1932    Age:  84 yrs. Medical record number:  [id number]     Account number:  [id number] Primary Care Provider: Ernestine Conrad ____________________________ CURRENT DIAGNOSES  1. Paroxysmal atrial fibrillation  2. Chronic systolic heart failure  3. Hypertensive heart disease without heart failure  4. CAD Native without angina  5. Aortic valve stenosis  6. Long term (current) use of anticoagulants  7. Presence of coronary stent  8. Obesity  9. Hyperlipidemia  10. Personal History Of Malignant Neoplasm Of Prostate  11. Personal history of other venous thrombosis and embolism  12. Cardiomyopathy, unspecified  13. Gastro-esophageal reflux disease  14. Chronic kidney disease, stage 3 (moderate)  15. Presence of cardiac pacemaker ____________________________ ALLERGIES  No Known Allergies ____________________________ MEDICATIONS  1. amlodipine 2.5 mg tablet, 1 p.o. daily  2. biotin 2,500 mcg capsule, 1 p.o. daily  3. docusate sodium 100 mg capsule, 1/2 tab daily  4. fenofibrate 160 mg tablet, 1 p.o. daily  5. Fish Oil 300 mg-1,000 mg capsule, 2 p.o. daily  6. furosemide 40 mg tablet, 1/2 tab daily  7. Glucosamine 500 mg tablet, 2000mg  qd  8. hydrocodone 10 mg-chlorpheniramine 8 mg/5 mL oral susp extend.rel 12hr, PRN  9. hydrocodone 5 mg-acetaminophen 325 mg tablet, PRN  10. multivitamin tablet, 1 p.o. daily  11. Nexium 40 mg capsule,delayed release, 1 p.o. daily  12. nitroglycerin 0.4 mg sublingual tablet, PRN  13. pravastatin 20 mg tablet, 1 p.o. daily  14. Unisom Sleepgels 50 mg capsule, QHS  15. Vitamin B-12 1,000 mcg tablet, 1/2 tab daily  16. Vitamin C 1,000 mg tablet, 1 p.o. daily  17. Vitamin D3 1,000 unit tablet, 1 p.o. daily  18. warfarin 2.5 mg tablet, 1 p.o. daily or as directed ____________________________ HISTORY OF PRESENT ILLNESS Patient returns for cardiac followup. He had a biventricular pacemaker  insertion and has been feeling a good bit better. He has not been exercising or been active and has gained some weight as a result. He is feeling much better however with less shortness of breath and feels as if he is able to be more active. He has had not had any bleeding complications from anticoagulation. He has not had any recurrence of atrial fibrillation. He denies PND, orthopnea, syncope, or claudication.____________________________ PAST HISTORY  Past Medical Illnesses:  hyperlipidemia, DVT, GERD, kidney stones, prostate cancer trated with surgery, hypertension, obesity, history of infection of left knee prosthesis treated with triple antibiotics for 6 months 2014-2015;  Cardiovascular Illnesses:  CAD, history of pulmonary embolus, atrial fibrillation, lateral wall MI 2011;  Surgical Procedures:  appendectomy, hemmoroidectomy, knee replacement-bil, prostatectomy 2006, multiple urethral implants for incontinence, hernia repair;  NYHA Classification:  I;  Canadian Angina Classification:  Class 0: Asymptomatic;  Cardiology Procedures-Invasive:  cardiac cath (left);  Cardiology Procedures-Noninvasive:  treadmill, echocardiogram 2015, lexiscan cardiolite 2015, echocardiogram August 2016, lexiscan Myoview August 2016, echocardiogram September 2017;  Cardiac Cath Results:  LHC (08/2009):~ Proximal LAD 30%, mid to distal LAD 25%, ostial small D1 75% mid AV groove circumflex 99% been subtotal stenosis, proximal to mid RCA 25-30%, mid RCA 30%, mid PDA 30%, EF 50% with inferior hypokinesis.;  Peripheral Vascular Procedures:  Greenfield IVC filter 2006 for DVT following prostatectomy;  LVEF of 50% documented via echocardiogram on 11/06/2016,   CHA2DS2-VASC Score:  4 ____________________________ CARDIO-PULMONARY TEST DATES EKG Date:  02/13/2017;  Stent Placement Date: 08/29/2009;  Nuclear Study Date:  09/24/2014;  Echocardiography Date: 11/06/2016;   ____________________________ FAMILY HISTORY Brother -- Brother  dead, Malignant melanoma Father -- Father dead, Malignant melanoma Mother -- Mother dead, Death of unknown cause Sister -- Sister alive and well ____________________________ SOCIAL HISTORY Alcohol Use:  no alcohol use;  Smoking:  used to smoke but quit Prior to 1980;  Diet:  regular diet;  Lifestyle:  married;  Exercise:  some exercise 2 days per week;  Occupation:  retired and Youth worker;  Residence:  lives with wife;   ____________________________ REVIEW OF SYSTEMS General:  weight gain of approximately 10 lbs Eyes: denies diplopia, history of glaucoma or visual problems. Ears, Nose, Throat, Mouth:  partial hearing loss Respiratory: denies dyspnea, cough, wheezing or hemoptysis. Cardiovascular:  please review HPI  Genitourinary-Male: incontinence  Musculoskeletal:  osteoarthritis Endocrine: hyperthyroidism  ____________________________ PHYSICAL EXAMINATION VITAL SIGNS  Blood Pressure:  140/80 Sitting, Left arm, large cuff  , 136/86 Standing, Left arm and large cuff   Pulse:  84/min. Weight:  244.00 lbs. Height:  72"BMI: 33  Constitutional:  pleasant white male in no acute distress, moderately obese Skin:  skin tags Head:  normocephalic, normal hair pattern, no masses or tenderness Neck:  supple, without massess. No JVD, thyromegaly or carotid bruits. Carotid upstroke normal. Chest:  normal symmetry, clear to auscultation. Cardiac:  regular rhythm, normal S1 and S2, no S3 or S4, grade 2/6 systolic murmur at aortic area radiating to neck Peripheral Pulses:  femoral pulse(s) 2+, posterior tibial pulse(s) 2+ Extremities & Back:  well healed scar from knee surgery bilaterally, trace edema Neurological:  no gross motor or sensory deficits noted, affect appropriate, oriented x3. ____________________________ MOST RECENT LIPID PANEL 09/19/16  CHOL TOTL 109 mg/dl, LDL 56 NM, HDL 34 mg/dl and TRIGLYCER 93 mg/dl ____________________________ IMPRESSIONS/PLAN 1. Paroxysmal atrial fibrillation  currently in sinus rhythm 2. Functioning biventricular pacemaker 3. Chronic systolic heart failure 4. CAD with previous angina 5. Chronic kidney disease stage III 6. Moderate aortic stenosis  Recommendations:  He has had improvement functionally since his pacemaker and is no longer bradycardic. Continue pacemaker checks through EP. I would like him to continue anticoagulation and he will have a followup visit in 6 months with an echocardiogram to assess whether he has any improvement with biventricular pacemaking.  ____________________________ TODAYS ORDERS  1. Coag Clinic Visit: Coag OV 1 month  2. 2D, color flow, doppler: 6 months  3. Return Visit: 6 months                       ____________________________ Cardiology Physician:  Kerry Hough MD [facility]

## 2017-11-20 NOTE — Progress Notes (Signed)
Cardiology Office Note:    Date:  11/21/2017   ID:  [name], DOB 10-Jun-1931, MRN [id number]  PCP:  Shirline Frees, MD  Cardiologist:  Shirlee More, MD    Referring MD: Shirline Frees, MD    ASSESSMENT:    1. Nonrheumatic aortic valve stenosis   2. Persistent atrial fibrillation   3. Cardiac pacemaker in situ   4. Long term (current) use of anticoagulants   5. 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)   6. Hypertensive heart disease, unspecified whether heart failure present   7. Chronic diastolic heart failure ([place])   8. Mixed hyperlipidemia   9. Coronary artery disease involving native coronary artery of native heart with angina pectoris ([place])    PLAN:    In order of problems listed above:  1. He has known moderate aortic stenosis is not having specific symptoms of chest pain shortness of breath or syncope but is at risk for progression repeat echocardiogram is ordered.  If he has severe aortic stenosis we need to start the discussion regarding intervention with TAVR. 2. Stable rate is controlled without suppressant treatment continue his anticoagulation we discussed the option of switching to a direct anticoagulant he is hesitant he understands warfarin his INR is always in range this time wants to continue traditional vitamin K antagonist anticoagulant therapy.  We will check INR today goal 2.5 3. Stable function followed in our device clinic note he knows persistent atrial fibrillation 4. Continue warfarin check INR today goal 2.5 refer to Coumadin clinic 5. Continue his current anticoagulation 6. Stable blood pressures at target continue his calcium channel blocker 7. Stable no evidence of decompensated heart failure currently not on a loop diuretic 8. Stable lipids are ideal 10/16/2017 cholesterol 99 HDL 29 LDL 52. 9. Stable CAD said no anginal discomfort New York Heart Association class I and at this time I would not pursue an  ischemia evaluation and continue medical therapy including his anticoagulants along with lipid-lowering therapy combined statin and fenofibrate.   Next appointment: 3 months   Medication Adjustments/Labs and Tests Ordered: Current medicines are reviewed at length with the patient today.  Concerns regarding medicines are outlined above.  No orders of the defined types were placed in this encounter.  No orders of the defined types were placed in this encounter.   Chief Complaint  Patient presents with  . Follow-up  . Anticoagulation  . Aortic Stenosis    History of Present Illness:    [name] is a 84 y.o. male with a hx of sick sinus syndrome with atrial fibrillation and biventricular pacemaker for heart block as well and has history of venous thromboembolism on long-term anticoagulation with warfarin CAD hyperlipidemia hypertension moderate AS mild AR and heart failure EF 45% last seen 3 months ago.  His last device checks 09/11/2017 shows persistent atrial fibrillation since 07/30/2017 Compliance with diet, lifestyle and medications: Yes  He is seen in the office in cardiology follow-up he is pleased with the quality of his life he has had no change in symptoms no chest pain shortness of breath palpitation or syncope.  Remains on warfarin and elects to continue it is due for follow-up echocardiogram I will order and if he has severe aortic stenosis initiate the discussion of intervention.  I reviewed his medical history prior to the visit reviewed recent laboratory studies are on 10/16/2017 creatinine 1.68 hemoglobin 15.9 potassium 5.2 TSH was normal.  He will be seen  by Dr. Wynonia Lawman in 3 months in the interim I will manage warfarin goal INR 2.5 Past Medical History:  Diagnosis Date  . Aortic stenosis    a. Echo (06/2013): Severe LVH, EF 55-60%, mild to moderate AS (mean 15 mm Hg), mild AI, mild LAE  . Arthritis    "knees, right shoulder" (03/14/2017)  . Atherosclerotic heart  disease of native coronary artery without angina pectoris    Cath 2011 LHC (08/2009):~ Proximal LAD 30%, mid to distal LAD 25%, ostial small D1 75% mid AV groove circumflex 99% been subtotal stenosis, proximal to mid RCA 25-30%, mid RCA 30%, mid PDA 30%, EF 50% with inferior hypokinesis.;    July 2011  PCI and DES to circumflex Dr. Olevia Perches   ETT-Myoview (06/2013):  Inferolateral scar, mild peri-infarct ischemia, EF 40%; Intermediate Risk   ECHO EF 55% 03/2013   . Atrial fibrillation ([place])    Dx 2016, placed on amiodarone  . AV block   . CAD (coronary artery disease), native coronary artery    Cath 2011 LHC (08/2009):~ Proximal LAD 30%, mid to distal LAD 25%, ostial small D1 75% mid AV groove circumflex 99% been subtotal stenosis, proximal to mid RCA 25-30%, mid RCA 30%, mid PDA 30%, EF 50% with inferior hypokinesis.;    July 2011  PCI and DES to circumflex Dr. Olevia Perches   ETT-Myoview (06/2013):  Inferolateral scar, mild peri-infarct ischemia, EF 40%; Intermediate Risk   ECHO EF 55% 03/2013   . Cardiac pacemaker in situ 03/14/2017   Biventricular [employer] inserted 03/14/17 Dr. Lovena Le for second degree heart block   . Chronic kidney disease (CKD) 11/12/2017  . CKD (chronic kidney disease), stage III ([place])   . Gastro-esophageal reflux disease without esophagitis 09/24/2009  . GERD   . Heart block AV second degree 03/14/2017  . Heart murmur   . High cholesterol   . History of hiatal hernia 1970s?  Marland Kitchen History of infection of prosthetic knee 04/05/2015   Treated with debridement and irrigation followed by 6 months of triple antibiotics  . History of infection of prosthetic left knee joint    Treated with debridement and irrigation followed by 6 months of triple antibiotics   . History of kidney stones   . History of peptic ulcer 1970s?  Marland Kitchen History of prostate cancer    Radical prostatectomy in 2006 Dr. Terance Hart   . History of pulmonary embolism    bilateral PE in 2013 s/p Greenfield filter  . Hypertensive heart  disease   . Long term (current) use of anticoagulants 10/06/2011  . Mixed hyperlipidemia   . Moderate persistent asthma without complication 4/56/2563  . Myocardial infarction ([place]) 08/2009  . Obesity (BMI 30-39.9)   . OSA on CPAP   . OSA treated with BiPAP 11/15/2015  . Paroxysmal atrial fibrillation ([facility]) 09/19/2014   CHA2DS2VASC score 5  Cardioversion 10/08/2014 was on amiodarone until 2018   . 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)    Initial DVT in 2006 after prostate surgery and had Greenfield filter placed Bilateral  PE 2013 and placed back on warfarin DVT of right subclavian vein on doppler 10/2012 at time of knee infection   . Personal history of malignant neoplasm of prostate    Radical prostatectomy in 2006 Dr. Terance Hart    . Presence of permanent cardiac pacemaker 03/14/2017  . Urinary incontinence    MULTIPLE BLADDER SURGERIES - STATES NO URINARY SPHINCTER - PT'S UROLOGIST IS AT [facility]- DR. PETERSON  (  LAST VISIT WAS 09/15/11 )    Past Surgical History:  Procedure Laterality Date  . APPENDECTOMY    . BI-VENTRICULAR PACEMAKER INSERTION (CRT-P)  03/14/2017  . BIV PACEMAKER INSERTION CRT-P N/A 03/14/2017   Procedure: BIV PACEMAKER INSERTION CRT-P;  Surgeon: Evans Lance, MD;  Location: [facility];  Service: Cardiovascular;  Laterality: N/A;  . CARDIOVERSION N/A 10/08/2014   Procedure: CARDIOVERSION;  Surgeon: Jacolyn Reedy, MD;  Location: [facility] ENDOSCOPY;  Service: Cardiovascular;  Laterality: N/A;  . CATARACT EXTRACTION W/ INTRAOCULAR LENS  IMPLANT, BILATERAL Bilateral   . CORONARY ANGIOPLASTY WITH STENT PLACEMENT  08/25/2009   DES-mid LCx 08/2009; 30% pLAD, 25% m/dLAD, 75% ostial D1, 99% mLCx s/p DES, 25-30% p/mRCA, 40% mRCA, 30% mPDA stenoses; LVEF 50%, inf hypokinesis  . EXCISIONAL HEMORRHOIDECTOMY    . I&D KNEE WITH POLY EXCHANGE Left 10/09/2012   Procedure: IRRIGATION AND DEBRIDEMENT LEFT KNEE WITH POLY REVISION;  Surgeon: Gearlean Alf, MD;   Location: [facility];  Service: Orthopedics;  Laterality: Left;  . INGUINAL HERNIA REPAIR Right   . JOINT REPLACEMENT    . KNEE ARTHROTOMY Left 10/09/2012   Procedure: LEFT KNEE ARTHROTOMY;  Surgeon: Gearlean Alf, MD;  Location: [facility];  Service: Orthopedics;  Laterality: Left;  . PROSTATECTOMY  04/25/2004  . REPLACEMENT TOTAL KNEE Bilateral   . SKIN BIOPSY     "off nose; wasn't cancer; it was tested" (03/14/2017)  . Uretheral implants     multiple for incontinence  . VENA CAVA FILTER PLACEMENT      Current Medications: Current Meds  Medication Sig  . amLODipine (NORVASC) 2.5 MG tablet Take 2.5 mg by mouth every evening.   . Ascorbic Acid (VITAMIN C) 1000 MG tablet Take 1,000 mg by mouth daily.  . Biotin 2500 MCG CAPS Take 2,500 mcg by mouth daily.  . cholecalciferol (VITAMIN D) 1000 UNITS tablet Take 1,000 Units by mouth daily.  . DiphenhydrAMINE HCl, Sleep, (UNISOM SLEEPGELS) 50 MG CAPS Take 50 mg by mouth at bedtime.  . docusate sodium (COLACE) 50 MG capsule Take 50 mg by mouth daily.  Marland Kitchen esomeprazole (NEXIUM) 40 MG capsule Take 40 mg by mouth daily before breakfast.   . fenofibrate 160 MG tablet Take 1 tablet (160 mg total) by mouth daily.  . Glucosamine HCl (GLUCOSAMINE PO) Take 2,000 mg by mouth daily.  . Multiple Vitamins-Minerals (ADULT GUMMY PO) Take 1 tablet by mouth daily.  . nitroGLYCERIN (NITROSTAT) 0.4 MG SL tablet Place 1 tablet (0.4 mg total) under the tongue every 5 (five) minutes as needed. (Patient taking differently: Place 0.4 mg under the tongue every 5 (five) minutes as needed (FOR CHEST PAIN.). )  . Omega-3 Fatty Acids (FISH OIL) 1000 MG CAPS Take 1,000 mg by mouth daily.  . pravastatin (PRAVACHOL) 20 MG tablet TAKE 1 TABLET BY MOUTH EVERY DAY (Patient taking differently: TAKE 1 TABLET BY MOUTH EVERY EVENING.)  . vitamin B-12 (CYANOCOBALAMIN) 500 MCG tablet Take 500 mcg by mouth daily.  Marland Kitchen warfarin (COUMADIN) 2.5 MG tablet Take 2.5 mg by mouth every evening.      Allergies:   Darifenacin and Lisinopril   Social History   Socioeconomic History  . Marital status: Married    Spouse name: Not on file  . Number of children: 3  . Years of education: Not on file  . Highest education level: Not on file  Occupational History  . Occupation: Retired Therapist, nutritional  Social Needs  . Financial resource strain: Not  on file  . Food insecurity:    Worry: Not on file    Inability: Not on file  . Transportation needs:    Medical: Not on file    Non-medical: Not on file  Tobacco Use  . Smoking status: Former Smoker    Packs/day: 1.00    Years: 10.00    Pack years: 10.00    Types: Cigarettes    Last attempt to quit: 04/27/1961    Years since quitting: 56.6  . Smokeless tobacco: Never Used  Substance and Sexual Activity  . Alcohol use: No    Alcohol/week: 0.0 standard drinks  . Drug use: No  . Sexual activity: Not on file  Lifestyle  . Physical activity:    Days per week: Not on file    Minutes per session: Not on file  . Stress: Not on file  Relationships  . Social connections:    Talks on phone: Not on file    Gets together: Not on file    Attends religious service: Not on file    Active member of club or organization: Not on file    Attends meetings of clubs or organizations: Not on file    Relationship status: Not on file  Other Topics Concern  . Not on file  Social History Narrative  . Not on file     Family History: The patient's family history includes Melanoma in his brother and father. ROS:   Please see the history of present illness.    All other systems reviewed and are negative.  EKGs/Labs/Other Studies Reviewed:    The following studies were reviewed today:   Recent Labs: 03/09/2017: BUN 31; Creatinine, Ser 1.55; Hemoglobin 15.4; Platelets 229; Potassium 4.6; Sodium 143  Recent Lipid Panel    Component Value Date/Time   CHOL 118 04/28/2011 1003   TRIG 85.0 04/28/2011 1003   HDL 34.90 (L) 04/28/2011 1003   CHOLHDL  3 04/28/2011 1003   VLDL 17.0 04/28/2011 1003   LDLCALC 66 04/28/2011 1003    Physical Exam:    VS:  BP 132/72 (BP Location: Right Arm, Patient Position: Sitting, Cuff Size: Large)   Pulse 60   Ht 6' (1.829 m)   Wt 235 lb (106.6 kg)   SpO2 97%   BMI 31.87 kg/m     Wt Readings from Last 3 Encounters:  11/21/17 235 lb (106.6 kg)  06/12/17 248 lb (112.5 kg)  04/09/17 244 lb (110.7 kg)     GEN:  Well nourished, well developed in no acute distress HEENT: Normal NECK: No JVD; No carotid bruits LYMPHATICS: No lymphadenopathy CARDIAC: Soft s1 3/6 harsh AS radiated to right clavicle S2 is single RRR RESPIRATORY:  Clear to auscultation without rales, wheezing or rhonchi  ABDOMEN: Soft, non-tender, non-distended MUSCULOSKELETAL:  No edema; No deformity  SKIN: Warm and dry NEUROLOGIC:  Alert and oriented x 3 PSYCHIATRIC:  Normal affect    Signed, Shirlee More, MD  11/21/2017 11:00 AM    [facility]

## 2017-11-21 ENCOUNTER — Encounter: Payer: Self-pay | Admitting: Cardiology

## 2017-11-21 ENCOUNTER — Ambulatory Visit (INDEPENDENT_AMBULATORY_CARE_PROVIDER_SITE_OTHER): Payer: Medicare Other | Admitting: Cardiology

## 2017-11-21 VITALS — BP 132/72 | HR 60 | Ht 72.0 in | Wt 235.0 lb

## 2017-11-21 DIAGNOSIS — Z86718 Personal history of other venous thrombosis and embolism: Secondary | ICD-10-CM

## 2017-11-21 DIAGNOSIS — I25119 Atherosclerotic heart disease of native coronary artery with unspecified angina pectoris: Secondary | ICD-10-CM | POA: Diagnosis not present

## 2017-11-21 DIAGNOSIS — Z950 Presence of cardiac pacemaker: Secondary | ICD-10-CM

## 2017-11-21 DIAGNOSIS — Z7901 Long term (current) use of anticoagulants: Secondary | ICD-10-CM

## 2017-11-21 DIAGNOSIS — I350 Nonrheumatic aortic (valve) stenosis: Secondary | ICD-10-CM

## 2017-11-21 DIAGNOSIS — I4819 Other persistent atrial fibrillation: Secondary | ICD-10-CM

## 2017-11-21 DIAGNOSIS — I119 Hypertensive heart disease without heart failure: Secondary | ICD-10-CM

## 2017-11-21 DIAGNOSIS — I5032 Chronic diastolic (congestive) heart failure: Secondary | ICD-10-CM | POA: Diagnosis not present

## 2017-11-21 DIAGNOSIS — E782 Mixed hyperlipidemia: Secondary | ICD-10-CM | POA: Diagnosis not present

## 2017-11-21 LAB — POCT INR: INR: 3 (ref 2.0–3.0)

## 2017-11-21 NOTE — Patient Instructions (Addendum)
Medication Instructions:  Your physician recommends that you continue on your current medications as directed. Please refer to the Current Medication list given to you today.  If you need a refill on your cardiac medications before your next appointment, please call your pharmacy.   Lab work: Your physician recommends that you return for lab work today: INR finger stick.   Testing/Procedures: None  Follow-Up: Your physician recommends that you schedule a follow-up appointment in 3 months with Dr. Wynonia Lawman.   **Please return to our [facility] office to recheck your INR in 1 month.    Echocardiogram An echocardiogram, or echocardiography, uses sound waves (ultrasound) to produce an image of your heart. The echocardiogram is simple, painless, obtained within a short period of time, and offers valuable information to your health care provider. The images from an echocardiogram can provide information such as:  Evidence of coronary artery disease (CAD).  Heart size.  Heart muscle function.  Heart valve function.  Aneurysm detection.  Evidence of a past heart attack.  Fluid buildup around the heart.  Heart muscle thickening.  Assess heart valve function.  Tell a health care provider about:  Any allergies you have.  All medicines you are taking, including vitamins, herbs, eye drops, creams, and over-the-counter medicines.  Any problems you or family members have had with anesthetic medicines.  Any blood disorders you have.  Any surgeries you have had.  Any medical conditions you have.  Whether you are pregnant or may be pregnant. What happens before the procedure? No special preparation is needed. Eat and drink normally. What happens during the procedure?  In order to produce an image of your heart, gel will be applied to your chest and a wand-like tool (transducer) will be moved over your chest. The gel will help transmit the sound waves from the transducer. The sound  waves will harmlessly bounce off your heart to allow the heart images to be captured in real-time motion. These images will then be recorded.  You may need an IV to receive a medicine that improves the quality of the pictures. What happens after the procedure? You may return to your normal schedule including diet, activities, and medicines, unless your health care provider tells you otherwise. This information is not intended to replace advice given to you by your health care provider. Make sure you discuss any questions you have with your health care provider. Document Released: 01/21/2000 Document Revised: 09/11/2015 Document Reviewed: 09/30/2012 Elsevier Interactive Patient Education  2017 [employer].

## 2017-11-28 ENCOUNTER — Ambulatory Visit (HOSPITAL_BASED_OUTPATIENT_CLINIC_OR_DEPARTMENT_OTHER)
Admission: RE | Admit: 2017-11-28 | Discharge: 2017-11-28 | Disposition: A | Discharge status: Home / Self Care | Payer: Medicare Other | Source: Ambulatory Visit | Attending: Cardiology | Admitting: Cardiology

## 2017-11-28 DIAGNOSIS — I350 Nonrheumatic aortic (valve) stenosis: Secondary | ICD-10-CM | POA: Diagnosis not present

## 2017-11-28 DIAGNOSIS — I110 Hypertensive heart disease with heart failure: Secondary | ICD-10-CM | POA: Diagnosis not present

## 2017-11-28 DIAGNOSIS — Z86718 Personal history of other venous thrombosis and embolism: Secondary | ICD-10-CM | POA: Insufficient documentation

## 2017-11-28 DIAGNOSIS — E785 Hyperlipidemia, unspecified: Secondary | ICD-10-CM | POA: Insufficient documentation

## 2017-11-28 DIAGNOSIS — I5032 Chronic diastolic (congestive) heart failure: Secondary | ICD-10-CM | POA: Diagnosis not present

## 2017-11-28 DIAGNOSIS — Z86711 Personal history of pulmonary embolism: Secondary | ICD-10-CM | POA: Diagnosis not present

## 2017-11-28 DIAGNOSIS — I4819 Other persistent atrial fibrillation: Secondary | ICD-10-CM | POA: Diagnosis not present

## 2017-11-28 NOTE — Progress Notes (Signed)
  Echocardiogram 2D Echocardiogram has been performed.  [name] M 11/28/2017, 3:58 PM

## 2017-12-11 ENCOUNTER — Ambulatory Visit (INDEPENDENT_AMBULATORY_CARE_PROVIDER_SITE_OTHER): Payer: Medicare Other | Admitting: *Deleted

## 2017-12-11 DIAGNOSIS — I441 Atrioventricular block, second degree: Secondary | ICD-10-CM | POA: Diagnosis not present

## 2017-12-11 DIAGNOSIS — I5032 Chronic diastolic (congestive) heart failure: Secondary | ICD-10-CM

## 2017-12-12 NOTE — Progress Notes (Signed)
Remote pacemaker transmission.   

## 2017-12-14 ENCOUNTER — Telehealth: Payer: Self-pay | Admitting: Cardiology

## 2017-12-14 NOTE — Telephone Encounter (Signed)
Spoke with patient regarding his echocardiogram report. Patient states he is very concerned about his aortic stenosis. Advised patient that Dr. Bettina Gavia reports this as moderate stenosis that only needs to be monitored at this time. Patient verablized understanding and scheduled 3 month follow up visit with Dr. Bettina Gavia on 02/18/17 at 10:20 am. Advised patient Dr. Bettina Gavia would discuss these results in more detail during appointment. Patient verbalized understanding. No further questions.

## 2017-12-14 NOTE — Telephone Encounter (Signed)
Left message to return call 

## 2017-12-14 NOTE — Telephone Encounter (Signed)
Please call patient regarding some questions about the echo report.

## 2017-12-16 ENCOUNTER — Encounter: Payer: Self-pay | Admitting: Cardiology

## 2017-12-19 ENCOUNTER — Ambulatory Visit (INDEPENDENT_AMBULATORY_CARE_PROVIDER_SITE_OTHER): Payer: Medicare Other

## 2017-12-19 DIAGNOSIS — Z86718 Personal history of other venous thrombosis and embolism: Secondary | ICD-10-CM | POA: Diagnosis not present

## 2017-12-19 DIAGNOSIS — I4819 Other persistent atrial fibrillation: Secondary | ICD-10-CM

## 2017-12-19 LAB — POCT INR: INR: 2.5 (ref 2.0–3.0)

## 2017-12-19 NOTE — Patient Instructions (Signed)
Description   Continue taking 1 tablet daily. Recheck in 5 weeks. Call us with any medication changes or concerns. Coumadin Clinic # [phone]-[place] office.      

## 2018-01-22 ENCOUNTER — Ambulatory Visit (INDEPENDENT_AMBULATORY_CARE_PROVIDER_SITE_OTHER): Payer: Medicare Other | Admitting: *Deleted

## 2018-01-22 DIAGNOSIS — Z86718 Personal history of other venous thrombosis and embolism: Secondary | ICD-10-CM

## 2018-01-22 DIAGNOSIS — I4819 Other persistent atrial fibrillation: Secondary | ICD-10-CM

## 2018-01-22 DIAGNOSIS — Z5181 Encounter for therapeutic drug level monitoring: Secondary | ICD-10-CM

## 2018-01-22 LAB — POCT INR: INR: 2.8 (ref 2.0–3.0)

## 2018-01-22 NOTE — Patient Instructions (Signed)
Description   Continue taking 1 tablet daily. Recheck in 6 weeks. Call us with any medication changes or concerns. Coumadin Clinic # [phone]-[place] office.     

## 2018-02-10 LAB — CUP PACEART REMOTE DEVICE CHECK
Battery Remaining Longevity: 80 mo
Battery Remaining Percentage: 95.5 %
Battery Voltage: 2.98 V
Brady Statistic AP VP Percent: 49 %
Brady Statistic AP VS Percent: 1 %
Brady Statistic AS VP Percent: 50 %
Brady Statistic AS VS Percent: 1 %
Brady Statistic RA Percent Paced: 24 %
Date Time Interrogation Session: 20191105075302
Implantable Lead Implant Date: 20190206
Implantable Lead Implant Date: 20190206
Implantable Lead Implant Date: 20190206
Implantable Lead Location: 753858
Implantable Lead Location: 753859
Implantable Lead Location: 753860
Implantable Pulse Generator Implant Date: 20190206
Lead Channel Impedance Value: 460 Ohm
Lead Channel Impedance Value: 550 Ohm
Lead Channel Impedance Value: 940 Ohm
Lead Channel Pacing Threshold Amplitude: 0.375 V
Lead Channel Pacing Threshold Amplitude: 0.625 V
Lead Channel Pacing Threshold Amplitude: 2.75 V
Lead Channel Pacing Threshold Pulse Width: 0.5 ms
Lead Channel Pacing Threshold Pulse Width: 0.5 ms
Lead Channel Pacing Threshold Pulse Width: 0.8 ms
Lead Channel Sensing Intrinsic Amplitude: 12 mV
Lead Channel Sensing Intrinsic Amplitude: 3.1 mV
Lead Channel Setting Pacing Amplitude: 1.375
Lead Channel Setting Pacing Amplitude: 2 V
Lead Channel Setting Pacing Amplitude: 3 V
Lead Channel Setting Pacing Pulse Width: 0.5 ms
Lead Channel Setting Pacing Pulse Width: 0.8 ms
Lead Channel Setting Sensing Sensitivity: 2 mV
Pulse Gen Model: 3262
Pulse Gen Serial Number: 8995812

## 2018-02-17 NOTE — H&P (View-Only) (Signed)
Cardiology Office Note:    Date:  02/18/2018   ID:  [name], DOB [birth year], MRN [id number]  PCP:  Shirline Frees, MD  Cardiologist:  Shirlee More, MD    Referring MD: Shirline Frees, MD    ASSESSMENT:    1. Nonrheumatic aortic valve stenosis   2. Coronary artery disease involving native coronary artery of native heart with angina pectoris ([place])   3. Hypertensive heart disease, unspecified whether heart failure present   4. Persistent atrial fibrillation   5. Chronic diastolic heart failure (HCC)   6. Cardiac pacemaker in situ   7. Obesity (BMI 30-39.9)    PLAN:    In order of problems listed above:  1. He is symptomatic I reviewed his last echocardiogram and although his gradients are in the moderate range when you look at other parameters he has low flow low EF severe aortic stenosis that symptomatic stage D the patient came prepared for discussion of intervention undergo right and left heart catheterization to guide treatment and decision regarding TAVR. 2. Stable CAD continue medical treatment 3. Blood pressure stable at target continue current medical treatment 4. Stable rate controlled continue anticoagulation.  If TAVR is performed I prefer to resume warfarin afterwards 5. Stable compensated continue his diuretic 6. Stable function followed in device clinic   Next appointment: 3 months with me   Medication Adjustments/Labs and Tests Ordered: Current medicines are reviewed at length with the patient today.  Concerns regarding medicines are outlined above.  No orders of the defined types were placed in this encounter.  No orders of the defined types were placed in this encounter.   Chief Complaint  Patient presents with  . Follow-up    for aortic stenosis    History of Present Illness:   sick sinus syndrome with persistent  atrial fibrillation and biventricular pacemaker for heart block as well and has history of venous thromboembolism on long-term  anticoagulation with warfarin CAD hyperlipidemia hypertension moderate AS mild AR and heart failure EF 45%. His echo 11/28/17 supports severe low flow AS.  Aortic valve:   Trileaflet; moderately thickened, moderately calcified leaflets. Mobility was not restricted.  Doppler:   There was moderate stenosis.   There was no regurgitation.    VTI ratio of LVOT to aortic valve: 0.23. Valve area (VTI): 0.94 cm^2. Indexed valve area (VTI): 0.4 cm^2/m^2. Peak velocity ratio of LVOT to aortic valve: 0.22. Valve area (Vmax): 0.92 cm^2. Indexed valve area (Vmax): 0.39 cm^2/m^2. Mean velocity ratio of LVOT to aortic valve: 0.21. Valve area (Vmean): 0.89 cm^2. Indexed valve area (Vmean): 0.38 cm^2/m^2.    Mean gradient (S): 29 mm Hg. Peak gradient (S): 50 mm Hg.   Stroke volume/bsa,    30    ml/m^2  [name] is a 85 y.o. male with a hx of CAD atrial fibrillation heart failure pacemaker and low-flow aortic stenosis last seen October 2019. Compliance with diet, lifestyle and medications: Yes  He does not feel well he has increasing exercise intolerance trouble walking the dog with shortness of breath and limiting fatigue he has been lightheaded but has not had syncope.  I independently reviewed his echo report and I think he has low flow low gradient severe aortic stenosis.  Patient came prepared for discussion of intervention and will proceed with right and left heart catheterization in preparation for TAVR.  He has an element of CKD and will recheck renal function and hydrate prior to procedure. Past Medical History:  Diagnosis  Date  . Aortic stenosis    a. Echo (06/2013): Severe LVH, EF 55-60%, mild to moderate AS (mean 15 mm Hg), mild AI, mild LAE  . Arthritis    "knees, right shoulder" (03/14/2017)  . Atherosclerotic heart disease of native coronary artery without angina pectoris    Cath 2011 LHC (08/2009):~ Proximal LAD 30%, mid to distal LAD 25%, ostial small D1 75% mid AV groove circumflex 99%  been subtotal stenosis, proximal to mid RCA 25-30%, mid RCA 30%, mid PDA 30%, EF 50% with inferior hypokinesis.;    July 2011  PCI and DES to circumflex Dr. Olevia Perches   ETT-Myoview (06/2013):  Inferolateral scar, mild peri-infarct ischemia, EF 40%; Intermediate Risk   ECHO EF 55% 03/2013   . Atrial fibrillation ([place])    Dx 2016, placed on amiodarone  . AV block   . CAD (coronary artery disease), native coronary artery    Cath 2011 LHC (08/2009):~ Proximal LAD 30%, mid to distal LAD 25%, ostial small D1 75% mid AV groove circumflex 99% been subtotal stenosis, proximal to mid RCA 25-30%, mid RCA 30%, mid PDA 30%, EF 50% with inferior hypokinesis.;    July 2011  PCI and DES to circumflex Dr. Olevia Perches   ETT-Myoview (06/2013):  Inferolateral scar, mild peri-infarct ischemia, EF 40%; Intermediate Risk   ECHO EF 55% 03/2013   . Cardiac pacemaker in situ 03/14/2017   Biventricular St. Jude inserted 03/14/17 Dr. Lovena Le for second degree heart block   . Chronic kidney disease (CKD) 11/12/2017  . CKD (chronic kidney disease), stage III ([place])   . Gastro-esophageal reflux disease without esophagitis 09/24/2009  . GERD   . Heart block AV second degree 03/14/2017  . Heart murmur   . High cholesterol   . History of hiatal hernia 1970s?  Marland Kitchen History of infection of prosthetic knee 04/05/2015   Treated with debridement and irrigation followed by 6 months of triple antibiotics  . History of infection of prosthetic left knee joint    Treated with debridement and irrigation followed by 6 months of triple antibiotics   . History of kidney stones   . History of peptic ulcer 1970s?  Marland Kitchen History of prostate cancer    Radical prostatectomy in 2006 Dr. Terance Hart   . History of pulmonary embolism    bilateral PE in 2013 s/p Greenfield filter  . Hypertensive heart disease   . Long term (current) use of anticoagulants 10/06/2011  . Mixed hyperlipidemia   . Moderate persistent asthma without complication 07/13/3708  . Myocardial infarction  ([place]) 08/2009  . Obesity (BMI 30-39.9)   . OSA on CPAP   . OSA treated with BiPAP 11/15/2015  . Paroxysmal atrial fibrillation (HCC) 09/19/2014   CHA2DS2VASC score 5  Cardioversion 10/08/2014 was on amiodarone until 2018   . 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)    Initial DVT in 2006 after prostate surgery and had Greenfield filter placed Bilateral  PE 2013 and placed back on warfarin DVT of right subclavian vein on doppler 10/2012 at time of knee infection   . Personal history of malignant neoplasm of prostate    Radical prostatectomy in 2006 Dr. Terance Hart    . Presence of permanent cardiac pacemaker 03/14/2017  . Urinary incontinence    MULTIPLE BLADDER SURGERIES - STATES NO URINARY SPHINCTER - PT'S UROLOGIST IS AT [facility]- DR. PETERSON  ( LAST VISIT WAS 09/15/11 )    Past Surgical History:  Procedure Laterality Date  . APPENDECTOMY    .  BI-VENTRICULAR PACEMAKER INSERTION (CRT-P)  03/14/2017  . BIV PACEMAKER INSERTION CRT-P N/A 03/14/2017   Procedure: BIV PACEMAKER INSERTION CRT-P;  Surgeon: Evans Lance, MD;  Location: [facility];  Service: Cardiovascular;  Laterality: N/A;  . CARDIOVERSION N/A 10/08/2014   Procedure: CARDIOVERSION;  Surgeon: Jacolyn Reedy, MD;  Location: [facility];  Service: Cardiovascular;  Laterality: N/A;  . CATARACT EXTRACTION W/ INTRAOCULAR LENS  IMPLANT, BILATERAL Bilateral   . CORONARY ANGIOPLASTY WITH STENT PLACEMENT  08/25/2009   DES-mid LCx 08/2009; 30% pLAD, 25% m/dLAD, 75% ostial D1, 99% mLCx s/p DES, 25-30% p/mRCA, 40% mRCA, 30% mPDA stenoses; LVEF 50%, inf hypokinesis  . EXCISIONAL HEMORRHOIDECTOMY    . I&D KNEE WITH POLY EXCHANGE Left 10/09/2012   Procedure: IRRIGATION AND DEBRIDEMENT LEFT KNEE WITH POLY REVISION;  Surgeon: Gearlean Alf, MD;  Location: [facility];  Service: Orthopedics;  Laterality: Left;  . INGUINAL HERNIA REPAIR Right   . JOINT REPLACEMENT    . KNEE ARTHROTOMY Left 10/09/2012   Procedure: LEFT KNEE  ARTHROTOMY;  Surgeon: Gearlean Alf, MD;  Location: [facility];  Service: Orthopedics;  Laterality: Left;  . PROSTATECTOMY  04/25/2004  . REPLACEMENT TOTAL KNEE Bilateral   . SKIN BIOPSY     "off nose; wasn't cancer; it was tested" (03/14/2017)  . Uretheral implants     multiple for incontinence  . VENA CAVA FILTER PLACEMENT      Current Medications: No outpatient medications have been marked as taking for the 02/18/18 encounter (Office Visit) with Richardo Priest, MD.     Allergies:   Darifenacin and Lisinopril   Social History   Socioeconomic History  . Marital status: Married    Spouse name: Not on file  . Number of children: 3  . Years of education: Not on file  . Highest education level: Not on file  Occupational History  . Occupation: Retired Therapist, nutritional  Social Needs  . Financial resource strain: Not on file  . Food insecurity:    Worry: Not on file    Inability: Not on file  . Transportation needs:    Medical: Not on file    Non-medical: Not on file  Tobacco Use  . Smoking status: Former Smoker    Packs/day: 1.00    Years: 10.00    Pack years: 10.00    Types: Cigarettes    Last attempt to quit: 04/27/1961    Years since quitting: 56.8  . Smokeless tobacco: Never Used  Substance and Sexual Activity  . Alcohol use: No    Alcohol/week: 0.0 standard drinks  . Drug use: No  . Sexual activity: Not on file  Lifestyle  . Physical activity:    Days per week: Not on file    Minutes per session: Not on file  . Stress: Not on file  Relationships  . Social connections:    Talks on phone: Not on file    Gets together: Not on file    Attends religious service: Not on file    Active member of club or organization: Not on file    Attends meetings of clubs or organizations: Not on file    Relationship status: Not on file  Other Topics Concern  . Not on file  Social History Narrative  . Not on file     Family History: The patient's family history includes Melanoma  in his brother and father. ROS:   Please see the history of present illness.    All  other systems reviewed and are negative.  EKGs/Labs/Other Studies Reviewed:    The following studies were reviewed today:  EKG:  EKG ordered today.  The ekg ordered today demonstrates ventricular paced rhythm normal function underlying atrial fibrillation  Recent Labs: 03/09/2017: BUN 31; Creatinine, Ser 1.55; Hemoglobin 15.4; Platelets 229; Potassium 4.6; Sodium 143  Recent Lipid Panel    Component Value Date/Time   CHOL 118 04/28/2011 1003   TRIG 85.0 04/28/2011 1003   HDL 34.90 (L) 04/28/2011 1003   CHOLHDL 3 04/28/2011 1003   VLDL 17.0 04/28/2011 1003   LDLCALC 66 04/28/2011 1003    Physical Exam:    VS:  BP 140/80 (BP Location: Right Arm, Patient Position: Sitting, Cuff Size: Large)   Pulse 62   Ht 6' (1.829 m)   Wt 238 lb (108 kg)   SpO2 96%   BMI 32.28 kg/m     Wt Readings from Last 3 Encounters:  02/18/18 238 lb (108 kg)  11/21/17 235 lb (106.6 kg)  06/12/17 248 lb (112.5 kg)     GEN:  Well nourished, well developed in no acute distress HEENT: Normal NECK: No JVD; No carotid bruits LYMPHATICS: No lymphadenopathy CARDIAC: Variable first heart sound regular rhythm 3 of 6 harsh late peaking left ear murmur S2 single radiates to the carotids RRR, no , rubs, gallops RESPIRATORY:  Clear to auscultation without rales, wheezing or rhonchi  ABDOMEN: Soft, non-tender, non-distended MUSCULOSKELETAL:  No edema; No deformity  SKIN: Warm and dry NEUROLOGIC:  Alert and oriented x 3 PSYCHIATRIC:  Normal affect    Signed, Shirlee More, MD  02/18/2018 10:27 AM    [place]

## 2018-02-17 NOTE — Progress Notes (Signed)
Cardiology Office Note:    Date:  02/18/2018   ID:  [name], DOB Sep 03, 1931, MRN [id number]  PCP:  Shirline Frees, MD  Cardiologist:  Shirlee More, MD    Referring MD: Shirline Frees, MD    ASSESSMENT:    1. Nonrheumatic aortic valve stenosis   2. Coronary artery disease involving native coronary artery of native heart with angina pectoris ([place])   3. Hypertensive heart disease, unspecified whether heart failure present   4. Persistent atrial fibrillation   5. Chronic diastolic heart failure (HCC)   6. Cardiac pacemaker in situ   7. Obesity (BMI 30-39.9)    PLAN:    In order of problems listed above:  1. He is symptomatic I reviewed his last echocardiogram and although his gradients are in the moderate range when you look at other parameters he has low flow low EF severe aortic stenosis that symptomatic stage D the patient came prepared for discussion of intervention undergo right and left heart catheterization to guide treatment and decision regarding TAVR. 2. Stable CAD continue medical treatment 3. Blood pressure stable at target continue current medical treatment 4. Stable rate controlled continue anticoagulation.  If TAVR is performed I prefer to resume warfarin afterwards 5. Stable compensated continue his diuretic 6. Stable function followed in device clinic   Next appointment: 3 months with me   Medication Adjustments/Labs and Tests Ordered: Current medicines are reviewed at length with the patient today.  Concerns regarding medicines are outlined above.  No orders of the defined types were placed in this encounter.  No orders of the defined types were placed in this encounter.   Chief Complaint  Patient presents with  . Follow-up    for aortic stenosis    History of Present Illness:   sick sinus syndrome with persistent  atrial fibrillation and biventricular pacemaker for heart block as well and has history of venous thromboembolism on long-term  anticoagulation with warfarin CAD hyperlipidemia hypertension moderate AS mild AR and heart failure EF 45%. His echo 11/28/17 supports severe low flow AS.  Aortic valve:   Trileaflet; moderately thickened, moderately calcified leaflets. Mobility was not restricted.  Doppler:   There was moderate stenosis.   There was no regurgitation.    VTI ratio of LVOT to aortic valve: 0.23. Valve area (VTI): 0.94 cm^2. Indexed valve area (VTI): 0.4 cm^2/m^2. Peak velocity ratio of LVOT to aortic valve: 0.22. Valve area (Vmax): 0.92 cm^2. Indexed valve area (Vmax): 0.39 cm^2/m^2. Mean velocity ratio of LVOT to aortic valve: 0.21. Valve area (Vmean): 0.89 cm^2. Indexed valve area (Vmean): 0.38 cm^2/m^2.    Mean gradient (S): 29 mm Hg. Peak gradient (S): 50 mm Hg.   Stroke volume/bsa,    30    ml/m^2  [name] is a 85 y.o. male with a hx of CAD atrial fibrillation heart failure pacemaker and low-flow aortic stenosis last seen October 2019. Compliance with diet, lifestyle and medications: Yes  He does not feel well he has increasing exercise intolerance trouble walking the dog with shortness of breath and limiting fatigue he has been lightheaded but has not had syncope.  I independently reviewed his echo report and I think he has low flow low gradient severe aortic stenosis.  Patient came prepared for discussion of intervention and will proceed with right and left heart catheterization in preparation for TAVR.  He has an element of CKD and will recheck renal function and hydrate prior to procedure. Past Medical History:  Diagnosis  Date  . Aortic stenosis    a. Echo (06/2013): Severe LVH, EF 55-60%, mild to moderate AS (mean 15 mm Hg), mild AI, mild LAE  . Arthritis    "knees, right shoulder" (03/14/2017)  . Atherosclerotic heart disease of native coronary artery without angina pectoris    Cath 2011 LHC (08/2009):~ Proximal LAD 30%, mid to distal LAD 25%, ostial small D1 75% mid AV groove circumflex 99%  been subtotal stenosis, proximal to mid RCA 25-30%, mid RCA 30%, mid PDA 30%, EF 50% with inferior hypokinesis.;    July 2011  PCI and DES to circumflex Dr. Olevia Perches   ETT-Myoview (06/2013):  Inferolateral scar, mild peri-infarct ischemia, EF 40%; Intermediate Risk   ECHO EF 55% 03/2013   . Atrial fibrillation ([place])    Dx 2016, placed on amiodarone  . AV block   . CAD (coronary artery disease), native coronary artery    Cath 2011 LHC (08/2009):~ Proximal LAD 30%, mid to distal LAD 25%, ostial small D1 75% mid AV groove circumflex 99% been subtotal stenosis, proximal to mid RCA 25-30%, mid RCA 30%, mid PDA 30%, EF 50% with inferior hypokinesis.;    July 2011  PCI and DES to circumflex Dr. Olevia Perches   ETT-Myoview (06/2013):  Inferolateral scar, mild peri-infarct ischemia, EF 40%; Intermediate Risk   ECHO EF 55% 03/2013   . Cardiac pacemaker in situ 03/14/2017   Biventricular St. Jude inserted 03/14/17 Dr. Lovena Le for second degree heart block   . Chronic kidney disease (CKD) 11/12/2017  . CKD (chronic kidney disease), stage III ([place])   . Gastro-esophageal reflux disease without esophagitis 09/24/2009  . GERD   . Heart block AV second degree 03/14/2017  . Heart murmur   . High cholesterol   . History of hiatal hernia 1970s?  Marland Kitchen History of infection of prosthetic knee 04/05/2015   Treated with debridement and irrigation followed by 6 months of triple antibiotics  . History of infection of prosthetic left knee joint    Treated with debridement and irrigation followed by 6 months of triple antibiotics   . History of kidney stones   . History of peptic ulcer 1970s?  Marland Kitchen History of prostate cancer    Radical prostatectomy in 2006 Dr. Terance Hart   . History of pulmonary embolism    bilateral PE in 2013 s/p Greenfield filter  . Hypertensive heart disease   . Long term (current) use of anticoagulants 10/06/2011  . Mixed hyperlipidemia   . Moderate persistent asthma without complication 7/61/9509  . Myocardial infarction  ([place]) 08/2009  . Obesity (BMI 30-39.9)   . OSA on CPAP   . OSA treated with BiPAP 11/15/2015  . Paroxysmal atrial fibrillation (HCC) 09/19/2014   CHA2DS2VASC score 5  Cardioversion 10/08/2014 was on amiodarone until 2018   . 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)    Initial DVT in 2006 after prostate surgery and had Greenfield filter placed Bilateral  PE 2013 and placed back on warfarin DVT of right subclavian vein on doppler 10/2012 at time of knee infection   . Personal history of malignant neoplasm of prostate    Radical prostatectomy in 2006 Dr. Terance Hart    . Presence of permanent cardiac pacemaker 03/14/2017  . Urinary incontinence    MULTIPLE BLADDER SURGERIES - STATES NO URINARY SPHINCTER - PT'S UROLOGIST IS AT [facility]- DR. PETERSON  ( LAST VISIT WAS 09/15/11 )    Past Surgical History:  Procedure Laterality Date  . APPENDECTOMY    .  BI-VENTRICULAR PACEMAKER INSERTION (CRT-P)  03/14/2017  . BIV PACEMAKER INSERTION CRT-P N/A 03/14/2017   Procedure: BIV PACEMAKER INSERTION CRT-P;  Surgeon: Evans Lance, MD;  Location: [place];  Service: Cardiovascular;  Laterality: N/A;  . CARDIOVERSION N/A 10/08/2014   Procedure: CARDIOVERSION;  Surgeon: Jacolyn Reedy, MD;  Location: [facility] ENDOSCOPY;  Service: Cardiovascular;  Laterality: N/A;  . CATARACT EXTRACTION W/ INTRAOCULAR LENS  IMPLANT, BILATERAL Bilateral   . CORONARY ANGIOPLASTY WITH STENT PLACEMENT  08/25/2009   DES-mid LCx 08/2009; 30% pLAD, 25% m/dLAD, 75% ostial D1, 99% mLCx s/p DES, 25-30% p/mRCA, 40% mRCA, 30% mPDA stenoses; LVEF 50%, inf hypokinesis  . EXCISIONAL HEMORRHOIDECTOMY    . I&D KNEE WITH POLY EXCHANGE Left 10/09/2012   Procedure: IRRIGATION AND DEBRIDEMENT LEFT KNEE WITH POLY REVISION;  Surgeon: Gearlean Alf, MD;  Location: [facility];  Service: Orthopedics;  Laterality: Left;  . INGUINAL HERNIA REPAIR Right   . JOINT REPLACEMENT    . KNEE ARTHROTOMY Left 10/09/2012   Procedure: LEFT KNEE  ARTHROTOMY;  Surgeon: Gearlean Alf, MD;  Location: [facility];  Service: Orthopedics;  Laterality: Left;  . PROSTATECTOMY  04/25/2004  . REPLACEMENT TOTAL KNEE Bilateral   . SKIN BIOPSY     "off nose; wasn't cancer; it was tested" (03/14/2017)  . Uretheral implants     multiple for incontinence  . VENA CAVA FILTER PLACEMENT      Current Medications: No outpatient medications have been marked as taking for the 02/18/18 encounter (Office Visit) with Richardo Priest, MD.     Allergies:   Darifenacin and Lisinopril   Social History   Socioeconomic History  . Marital status: Married    Spouse name: Not on file  . Number of children: 3  . Years of education: Not on file  . Highest education level: Not on file  Occupational History  . Occupation: Retired Therapist, nutritional  Social Needs  . Financial resource strain: Not on file  . Food insecurity:    Worry: Not on file    Inability: Not on file  . Transportation needs:    Medical: Not on file    Non-medical: Not on file  Tobacco Use  . Smoking status: Former Smoker    Packs/day: 1.00    Years: 10.00    Pack years: 10.00    Types: Cigarettes    Last attempt to quit: 04/27/1961    Years since quitting: 56.8  . Smokeless tobacco: Never Used  Substance and Sexual Activity  . Alcohol use: No    Alcohol/week: 0.0 standard drinks  . Drug use: No  . Sexual activity: Not on file  Lifestyle  . Physical activity:    Days per week: Not on file    Minutes per session: Not on file  . Stress: Not on file  Relationships  . Social connections:    Talks on phone: Not on file    Gets together: Not on file    Attends religious service: Not on file    Active member of club or organization: Not on file    Attends meetings of clubs or organizations: Not on file    Relationship status: Not on file  Other Topics Concern  . Not on file  Social History Narrative  . Not on file     Family History: The patient's family history includes Melanoma  in his brother and father. ROS:   Please see the history of present illness.    All  other systems reviewed and are negative.  EKGs/Labs/Other Studies Reviewed:    The following studies were reviewed today:  EKG:  EKG ordered today.  The ekg ordered today demonstrates ventricular paced rhythm normal function underlying atrial fibrillation  Recent Labs: 03/09/2017: BUN 31; Creatinine, Ser 1.55; Hemoglobin 15.4; Platelets 229; Potassium 4.6; Sodium 143  Recent Lipid Panel    Component Value Date/Time   CHOL 118 04/28/2011 1003   TRIG 85.0 04/28/2011 1003   HDL 34.90 (L) 04/28/2011 1003   CHOLHDL 3 04/28/2011 1003   VLDL 17.0 04/28/2011 1003   LDLCALC 66 04/28/2011 1003    Physical Exam:    VS:  BP 140/80 (BP Location: Right Arm, Patient Position: Sitting, Cuff Size: Large)   Pulse 62   Ht 6' (1.829 m)   Wt 238 lb (108 kg)   SpO2 96%   BMI 32.28 kg/m     Wt Readings from Last 3 Encounters:  02/18/18 238 lb (108 kg)  11/21/17 235 lb (106.6 kg)  06/12/17 248 lb (112.5 kg)     GEN:  Well nourished, well developed in no acute distress HEENT: Normal NECK: No JVD; No carotid bruits LYMPHATICS: No lymphadenopathy CARDIAC: Variable first heart sound regular rhythm 3 of 6 harsh late peaking left ear murmur S2 single radiates to the carotids RRR, no , rubs, gallops RESPIRATORY:  Clear to auscultation without rales, wheezing or rhonchi  ABDOMEN: Soft, non-tender, non-distended MUSCULOSKELETAL:  No edema; No deformity  SKIN: Warm and dry NEUROLOGIC:  Alert and oriented x 3 PSYCHIATRIC:  Normal affect    Signed, Shirlee More, MD  02/18/2018 10:27 AM    [place]

## 2018-02-18 ENCOUNTER — Encounter: Payer: Self-pay | Admitting: Cardiology

## 2018-02-18 ENCOUNTER — Encounter (HOSPITAL_BASED_OUTPATIENT_CLINIC_OR_DEPARTMENT_OTHER): Payer: Self-pay

## 2018-02-18 ENCOUNTER — Ambulatory Visit (HOSPITAL_BASED_OUTPATIENT_CLINIC_OR_DEPARTMENT_OTHER): Admission: RE | Admit: 2018-02-18 | Payer: Medicare Other | Source: Ambulatory Visit

## 2018-02-18 ENCOUNTER — Ambulatory Visit (INDEPENDENT_AMBULATORY_CARE_PROVIDER_SITE_OTHER): Payer: Medicare Other | Admitting: Cardiology

## 2018-02-18 VITALS — BP 140/80 | HR 62 | Ht 72.0 in | Wt 238.0 lb

## 2018-02-18 DIAGNOSIS — E669 Obesity, unspecified: Secondary | ICD-10-CM | POA: Diagnosis not present

## 2018-02-18 DIAGNOSIS — Z950 Presence of cardiac pacemaker: Secondary | ICD-10-CM

## 2018-02-18 DIAGNOSIS — Z01818 Encounter for other preprocedural examination: Secondary | ICD-10-CM

## 2018-02-18 DIAGNOSIS — I5032 Chronic diastolic (congestive) heart failure: Secondary | ICD-10-CM | POA: Diagnosis not present

## 2018-02-18 DIAGNOSIS — I4819 Other persistent atrial fibrillation: Secondary | ICD-10-CM

## 2018-02-18 DIAGNOSIS — I119 Hypertensive heart disease without heart failure: Secondary | ICD-10-CM | POA: Diagnosis not present

## 2018-02-18 DIAGNOSIS — I350 Nonrheumatic aortic (valve) stenosis: Secondary | ICD-10-CM

## 2018-02-18 DIAGNOSIS — I25119 Atherosclerotic heart disease of native coronary artery with unspecified angina pectoris: Secondary | ICD-10-CM

## 2018-02-18 NOTE — Patient Instructions (Signed)
Medication Instructions:  Your physician recommends that you continue on your current medications as directed. Please refer to the Current Medication list given to you today.  If you need a refill on your cardiac medications before your next appointment, please call your pharmacy.   Lab work: Your physician recommends that you return for lab work in: CBC, BMP, PT/INR.   If you have labs (blood work) drawn today and your tests are completely normal, you will receive your results only by: Marland Kitchen MyChart Message (if you have MyChart) OR . A paper copy in the mail If you have any lab test that is abnormal or we need to change your treatment, we will call you to review the results.  Testing/Procedures: You had an EKG today.  A chest x-ray takes a picture of the organs and structures inside the chest, including the heart, lungs, and blood vessels. This test can show several things, including, whether the heart is enlarges; whether fluid is building up in the lungs; and whether pacemaker / defibrillator leads are still in place.     [facility] [place], [place] [address] Dept: [phone] Loc: [phone]  [name]  02/18/2018  You are scheduled for a Cardiac Catheterization on Thursday, January 23 with Dr. Sherren Mocha.  1. Please arrive at the [facility] (Main Entrance A) at [facility]: [id number] [place],  [id number] at 10:00 AM (This time is two hours before your procedure to ensure your preparation). Free valet parking service is available.   Special note: Every effort is made to have your procedure done on time. Please understand that emergencies sometimes delay scheduled procedures.  2. Diet: Do not eat solid foods after midnight.  The patient may have clear liquids until 5am upon the day of the procedure.  3. Labs: None needed.   4. Medication  instructions in preparation for your procedure:   Contrast Allergy: No   Stop taking Coumadin (Warfarin) on Sunday, January 19.  Stop taking, Lasix (Furosemide)  Wednesday, January 23,  On the morning of your procedure, take your Aspirin and any morning medicines NOT listed above.  You may use sips of water.  5. Plan for one night stay--bring personal belongings. 6. Bring a current list of your medications and current insurance cards. 7. You MUST have a responsible person to drive you home. 8. Someone MUST be with you the first 24 hours after you arrive home or your discharge will be delayed. 9. Please wear clothes that are easy to get on and off and wear slip-on shoes.  Thank you for allowing [place] to care for you!   -- [place] [facility]   Follow-Up: At [facility], you and your health needs are our priority.  As part of our continuing mission to provide you with exceptional heart care, we have created designated Provider Care Teams.  These Care Teams include your primary Cardiologist (physician) and Advanced Practice Providers (APPs -  Physician Assistants and Nurse Practitioners) who all work together to provide you with the care you need, when you need it. You will need a follow up appointment in 3 months.  Please call our office 2 months in advance to schedule this appointment.

## 2018-02-19 ENCOUNTER — Ambulatory Visit (HOSPITAL_BASED_OUTPATIENT_CLINIC_OR_DEPARTMENT_OTHER)
Admission: RE | Admit: 2018-02-19 | Discharge: 2018-02-19 | Disposition: A | Discharge status: Home / Self Care | Payer: Medicare Other | Source: Ambulatory Visit | Attending: Cardiology | Admitting: Cardiology

## 2018-02-19 ENCOUNTER — Telehealth: Payer: Self-pay | Admitting: Cardiology

## 2018-02-19 ENCOUNTER — Encounter: Payer: Self-pay | Admitting: Physician Assistant

## 2018-02-19 DIAGNOSIS — Z01818 Encounter for other preprocedural examination: Secondary | ICD-10-CM | POA: Insufficient documentation

## 2018-02-19 LAB — CBC
Hematocrit: 48.3 % (ref 37.5–51.0)
Hemoglobin: 16 g/dL (ref 13.0–17.7)
MCH: 27.2 pg (ref 26.6–33.0)
MCHC: 33.1 g/dL (ref 31.5–35.7)
MCV: 82 fL (ref 79–97)
Platelets: 191 10*3/uL (ref 150–450)
RBC: 5.89 x10E6/uL — ABNORMAL HIGH (ref 4.14–5.80)
RDW: 16.8 % — ABNORMAL HIGH (ref 11.6–15.4)
WBC: 5.2 10*3/uL (ref 3.4–10.8)

## 2018-02-19 LAB — BASIC METABOLIC PANEL
BUN/Creatinine Ratio: 21 (ref 10–24)
BUN: 32 mg/dL — ABNORMAL HIGH (ref 8–27)
CO2: 21 mmol/L (ref 20–29)
Calcium: 10.5 mg/dL — ABNORMAL HIGH (ref 8.6–10.2)
Chloride: 110 mmol/L — ABNORMAL HIGH (ref 96–106)
Creatinine, Ser: 1.55 mg/dL — ABNORMAL HIGH (ref 0.76–1.27)
GFR calc Af Amer: 47 mL/min/{1.73_m2} — ABNORMAL LOW (ref >59)
GFR calc non Af Amer: 41 mL/min/{1.73_m2} — ABNORMAL LOW (ref >59)
Glucose: 96 mg/dL (ref 65–99)
Potassium: 4.7 mmol/L (ref 3.5–5.2)
Sodium: 142 mmol/L (ref 134–144)

## 2018-02-19 LAB — PROTIME-INR
INR: 2.3 — ABNORMAL HIGH (ref 0.8–1.2)
PROTHROMBIN TIME: 22.7 s — AB (ref 9.1–12.0)

## 2018-02-19 NOTE — Telephone Encounter (Signed)
Patient called to make Dr. 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 Advised patient I would let Dr. Bettina Gavia know. Patient verbalized understanding. No further questions.

## 2018-02-19 NOTE — Telephone Encounter (Signed)
OK his right heart cath will be from his upper extremity above the filter

## 2018-02-20 NOTE — Telephone Encounter (Signed)
Patient informed and verbalized understanding. Reviewed medication instructions pre-catheterization. Patient verbalized understanding. No further questions.

## 2018-02-26 ENCOUNTER — Telehealth: Payer: Self-pay | Admitting: Pharmacist

## 2018-02-26 ENCOUNTER — Telehealth: Payer: Self-pay | Admitting: *Deleted

## 2018-02-26 NOTE — Telephone Encounter (Signed)
Pt contacted pre-catheterization scheduled at [name] for: Thursday February 28, 2018 12 noon Verified arrival time and place: [facility] En[facility] A at:  7 AM-pre procedure hydration  No solid food after midnight prior to cath, clear liquids until 5 AM day of procedure.  Hold: Furosemide-day before and day of procedure.  Coumadin-last dose 02/23/18 until post procedure.  Except hold medications AM meds can be  taken pre-cath with sip of water including: ASA 81 mg  Confirmed patient has responsible person to drive home post procedure and for 24 hours after you arrive home: yes  IVC filter/history of PE/DVT

## 2018-02-26 NOTE — Telephone Encounter (Signed)
Received pre cath notification that pt is holding his warfarin for 5 days prior to cath. 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 Typically would bridge this pt with Lovenox while off warfarin due to recurrent VTE, however his cath is in 2 days so we are unable to do so at this time due to late notification.   Will route to managing MD regarding potential need for Lovenox for future procedures.

## 2018-02-26 NOTE — Telephone Encounter (Signed)
-----   Message from Katrine Coho, RN sent at 02/26/2018 10:05 AM EST ----- Regarding: R/LHC 1/23-holding warfarin Hey Everyone,  He is scheduled for a [facility] 02/28/18, pt of Dr Bettina Gavia in [facility], looks like he gets coumadin checked in [facility]. He has been instructed to take last dose of coumadin 02/23/18 and hold until post procedure 02/28/18. He does have IVC filter with history of PE/DVT/ atrial fibrillation.   Just wanted to let you know.  Thanks, Webb Silversmith

## 2018-02-27 ENCOUNTER — Other Ambulatory Visit: Payer: Self-pay | Admitting: Physician Assistant

## 2018-02-27 DIAGNOSIS — I350 Nonrheumatic aortic (valve) stenosis: Secondary | ICD-10-CM

## 2018-02-28 ENCOUNTER — Encounter (HOSPITAL_COMMUNITY)
Admission: RE | Disposition: A | Discharge status: Home / Self Care | Payer: Self-pay | Source: Home / Self Care | Attending: Cardiovascular Disease

## 2018-02-28 ENCOUNTER — Other Ambulatory Visit: Payer: Self-pay

## 2018-02-28 ENCOUNTER — Ambulatory Visit (HOSPITAL_COMMUNITY)
Admission: RE | Admit: 2018-02-28 | Discharge: 2018-02-28 | Disposition: A | Discharge status: Home / Self Care | Payer: Medicare Other | Attending: Cardiovascular Disease | Admitting: Cardiovascular Disease

## 2018-02-28 ENCOUNTER — Encounter: Payer: Self-pay | Admitting: Physician Assistant

## 2018-02-28 ENCOUNTER — Other Ambulatory Visit: Payer: Self-pay | Admitting: Physician Assistant

## 2018-02-28 DIAGNOSIS — I4819 Other persistent atrial fibrillation: Secondary | ICD-10-CM | POA: Insufficient documentation

## 2018-02-28 DIAGNOSIS — I350 Nonrheumatic aortic (valve) stenosis: Secondary | ICD-10-CM | POA: Diagnosis not present

## 2018-02-28 DIAGNOSIS — Z87891 Personal history of nicotine dependence: Secondary | ICD-10-CM | POA: Diagnosis not present

## 2018-02-28 DIAGNOSIS — J4540 Moderate persistent asthma, uncomplicated: Secondary | ICD-10-CM | POA: Diagnosis not present

## 2018-02-28 DIAGNOSIS — I5032 Chronic diastolic (congestive) heart failure: Secondary | ICD-10-CM | POA: Diagnosis not present

## 2018-02-28 DIAGNOSIS — N183 Chronic kidney disease, stage 3 (moderate): Secondary | ICD-10-CM | POA: Diagnosis not present

## 2018-02-28 DIAGNOSIS — K219 Gastro-esophageal reflux disease without esophagitis: Secondary | ICD-10-CM | POA: Diagnosis not present

## 2018-02-28 DIAGNOSIS — Z9079 Acquired absence of other genital organ(s): Secondary | ICD-10-CM | POA: Insufficient documentation

## 2018-02-28 DIAGNOSIS — I130 Hypertensive heart and chronic kidney disease with heart failure and stage 1 through stage 4 chronic kidney disease, or unspecified chronic kidney disease: Secondary | ICD-10-CM | POA: Diagnosis not present

## 2018-02-28 DIAGNOSIS — E782 Mixed hyperlipidemia: Secondary | ICD-10-CM | POA: Insufficient documentation

## 2018-02-28 DIAGNOSIS — Z955 Presence of coronary angioplasty implant and graft: Secondary | ICD-10-CM | POA: Diagnosis not present

## 2018-02-28 DIAGNOSIS — I495 Sick sinus syndrome: Secondary | ICD-10-CM | POA: Diagnosis not present

## 2018-02-28 DIAGNOSIS — Z86718 Personal history of other venous thrombosis and embolism: Secondary | ICD-10-CM | POA: Insufficient documentation

## 2018-02-28 DIAGNOSIS — I050 Rheumatic mitral stenosis: Secondary | ICD-10-CM | POA: Diagnosis not present

## 2018-02-28 DIAGNOSIS — Z888 Allergy status to other drugs, medicaments and biological substances status: Secondary | ICD-10-CM | POA: Diagnosis not present

## 2018-02-28 DIAGNOSIS — G4733 Obstructive sleep apnea (adult) (pediatric): Secondary | ICD-10-CM | POA: Insufficient documentation

## 2018-02-28 DIAGNOSIS — I25119 Atherosclerotic heart disease of native coronary artery with unspecified angina pectoris: Secondary | ICD-10-CM | POA: Diagnosis not present

## 2018-02-28 DIAGNOSIS — M1990 Unspecified osteoarthritis, unspecified site: Secondary | ICD-10-CM | POA: Insufficient documentation

## 2018-02-28 DIAGNOSIS — Z6832 Body mass index (BMI) 32.0-32.9, adult: Secondary | ICD-10-CM | POA: Insufficient documentation

## 2018-02-28 DIAGNOSIS — Z8546 Personal history of malignant neoplasm of prostate: Secondary | ICD-10-CM | POA: Diagnosis not present

## 2018-02-28 DIAGNOSIS — I2584 Coronary atherosclerosis due to calcified coronary lesion: Secondary | ICD-10-CM | POA: Insufficient documentation

## 2018-02-28 DIAGNOSIS — Z950 Presence of cardiac pacemaker: Secondary | ICD-10-CM | POA: Diagnosis not present

## 2018-02-28 DIAGNOSIS — Z7901 Long term (current) use of anticoagulants: Secondary | ICD-10-CM | POA: Insufficient documentation

## 2018-02-28 DIAGNOSIS — I2510 Atherosclerotic heart disease of native coronary artery without angina pectoris: Secondary | ICD-10-CM

## 2018-02-28 DIAGNOSIS — Z86711 Personal history of pulmonary embolism: Secondary | ICD-10-CM | POA: Diagnosis not present

## 2018-02-28 DIAGNOSIS — E669 Obesity, unspecified: Secondary | ICD-10-CM | POA: Diagnosis not present

## 2018-02-28 HISTORY — PX: LEFT HEART CATH AND CORONARY ANGIOGRAPHY: CATH118249

## 2018-02-28 LAB — PROTIME-INR
INR: 1.35
Prothrombin Time: 16.6 seconds — ABNORMAL HIGH (ref 11.4–15.2)

## 2018-02-28 SURGERY — LEFT HEART CATH AND CORONARY ANGIOGRAPHY
Anesthesia: LOCAL

## 2018-02-28 MED ORDER — LIDOCAINE HCL (PF) 1 % IJ SOLN
INTRAMUSCULAR | Status: DC | PRN
  Administered 2018-02-28 (×2): 2 mL

## 2018-02-28 MED ORDER — SODIUM CHLORIDE 0.9% FLUSH: 3.0000 mL | INTRAVENOUS | Status: DC | PRN

## 2018-02-28 MED ORDER — MIDAZOLAM HCL 2 MG/2ML IJ SOLN
INTRAMUSCULAR | Status: AC
  Filled 2018-02-28: qty 2

## 2018-02-28 MED ORDER — ACETAMINOPHEN 325 MG PO TABS: 650.0000 mg | ORAL_TABLET | ORAL | Status: DC | PRN

## 2018-02-28 MED ORDER — SODIUM CHLORIDE 0.9 % WEIGHT BASED INFUSION: 1.0000 mL/kg/h | INTRAVENOUS | Status: DC

## 2018-02-28 MED ORDER — HEPARIN (PORCINE) IN NACL 1000-0.9 UT/500ML-% IV SOLN
INTRAVENOUS | Status: AC
  Filled 2018-02-28: qty 1000

## 2018-02-28 MED ORDER — ASPIRIN 81 MG PO CHEW: 81.0000 mg | CHEWABLE_TABLET | ORAL | Status: DC

## 2018-02-28 MED ORDER — HEPARIN (PORCINE) IN NACL 1000-0.9 UT/500ML-% IV SOLN
INTRAVENOUS | Status: DC | PRN
  Administered 2018-02-28 (×2): 500 mL

## 2018-02-28 MED ORDER — LIDOCAINE HCL (PF) 1 % IJ SOLN
INTRAMUSCULAR | Status: AC
  Filled 2018-02-28: qty 30

## 2018-02-28 MED ORDER — MIDAZOLAM HCL 2 MG/2ML IJ SOLN
INTRAMUSCULAR | Status: DC | PRN
  Administered 2018-02-28: 1 mg via INTRAVENOUS

## 2018-02-28 MED ORDER — SODIUM CHLORIDE 0.9 % WEIGHT BASED INFUSION
3.0000 mL/kg/h | INTRAVENOUS | Status: AC
  Administered 2018-02-28: 3 mL/kg/h via INTRAVENOUS

## 2018-02-28 MED ORDER — ONDANSETRON HCL 4 MG/2ML IJ SOLN: 4.0000 mg | Freq: Four times a day (QID) | INTRAMUSCULAR | Status: DC | PRN

## 2018-02-28 MED ORDER — SODIUM CHLORIDE 0.9 % IV SOLN: INTRAVENOUS | Status: DC

## 2018-02-28 MED ORDER — SODIUM CHLORIDE 0.9 % IV SOLN: 250.0000 mL | INTRAVENOUS | Status: DC | PRN

## 2018-02-28 MED ORDER — IOHEXOL 350 MG/ML SOLN
INTRAVENOUS | Status: DC | PRN
  Administered 2018-02-28: 45 mL

## 2018-02-28 MED ORDER — SODIUM CHLORIDE 0.9% FLUSH: 3.0000 mL | Freq: Two times a day (BID) | INTRAVENOUS | Status: DC

## 2018-02-28 MED ORDER — VERAPAMIL HCL 2.5 MG/ML IV SOLN
INTRAVENOUS | Status: DC | PRN
  Administered 2018-02-28: 10 mL via INTRA_ARTERIAL

## 2018-02-28 MED ORDER — HEPARIN SODIUM (PORCINE) 1000 UNIT/ML IJ SOLN
INTRAMUSCULAR | Status: DC | PRN
  Administered 2018-02-28: 5000 [IU] via INTRAVENOUS

## 2018-02-28 MED ORDER — HEPARIN SODIUM (PORCINE) 1000 UNIT/ML IJ SOLN
INTRAMUSCULAR | Status: AC
  Filled 2018-02-28: qty 1

## 2018-02-28 MED ORDER — FENTANYL CITRATE (PF) 100 MCG/2ML IJ SOLN
INTRAMUSCULAR | Status: AC
  Filled 2018-02-28: qty 2

## 2018-02-28 MED ORDER — VERAPAMIL HCL 2.5 MG/ML IV SOLN
INTRAVENOUS | Status: AC
  Filled 2018-02-28: qty 2

## 2018-02-28 MED ORDER — FENTANYL CITRATE (PF) 100 MCG/2ML IJ SOLN
INTRAMUSCULAR | Status: DC | PRN
  Administered 2018-02-28: 25 ug via INTRAVENOUS

## 2018-02-28 SURGICAL SUPPLY — 14 items
CATH 5FR JL3.5 JR4 ANG PIG MP (CATHETERS) ×1 IMPLANT
CATH BALLN WEDGE 5F 110CM (CATHETERS) ×1 IMPLANT
CATH INFINITI 5FR AL1 (CATHETERS) ×1 IMPLANT
DEVICE RAD COMP TR BAND LRG (VASCULAR PRODUCTS) ×1 IMPLANT
GLIDESHEATH SLEND SS 6F .021 (SHEATH) ×1 IMPLANT
GUIDEWIRE INQWIRE 1.5J.035X260 (WIRE) IMPLANT
INQWIRE 1.5J .035X260CM (WIRE) ×2
KIT HEART LEFT (KITS) ×2 IMPLANT
PACK CARDIAC CATHETERIZATION (CUSTOM PROCEDURE TRAY) ×2 IMPLANT
SHEATH GLIDE SLENDER 4/5FR (SHEATH) ×1 IMPLANT
SHEATH PROBE COVER 6X72 (BAG) IMPLANT
TRANSDUCER W/STOPCOCK (MISCELLANEOUS) ×2 IMPLANT
TUBING CIL FLEX 10 FLL-RA (TUBING) ×2 IMPLANT
WIRE EMERALD ST .035X150CM (WIRE) ×1 IMPLANT

## 2018-02-28 NOTE — Discharge Instructions (Signed)
Radial Site Care ° °This sheet gives you information about how to care for yourself after your procedure. Your health care provider may also give you more specific instructions. If you have problems or questions, contact your health care provider. °What can I expect after the procedure? °After the procedure, it is common to have: °· Bruising and tenderness at the catheter insertion area. °Follow these instructions at home: °Medicines °· Take over-the-counter and prescription medicines only as told by your health care provider. °Insertion site care °· Follow instructions from your health care provider about how to take care of your insertion site. Make sure you: °? Wash your hands with soap and water before you change your bandage (dressing). If soap and water are not available, use hand sanitizer. °? Change your dressing as told by your health care provider. °? Leave stitches (sutures), skin glue, or adhesive strips in place. These skin closures may need to stay in place for 2 weeks or longer. If adhesive strip edges start to loosen and curl up, you may trim the loose edges. Do not remove adhesive strips completely unless your health care provider tells you to do that. °· Check your insertion site every day for signs of infection. Check for: °? Redness, swelling, or pain. °? Fluid or blood. °? Pus or a bad smell. °? Warmth. °· Do not take baths, swim, or use a hot tub until your health care provider approves. °· You may shower 24-48 hours after the procedure, or as directed by your health care provider. °? Remove the dressing and gently wash the site with plain soap and water. °? Pat the area dry with a clean towel. °? Do not rub the site. That could cause bleeding. °· Do not apply powder or lotion to the site. °Activity ° °· For 24 hours after the procedure, or as directed by your health care provider: °? Do not flex or bend the affected arm. °? Do not push or pull heavy objects with the affected arm. °? Do not  drive yourself home from the hospital or clinic. You may drive 24 hours after the procedure unless your health care provider tells you not to. °? Do not operate machinery or power tools. °· Do not lift anything that is heavier than 10 lb (4.5 kg), or the limit that you are told, until your health care provider says that it is safe. °· Ask your health care provider when it is okay to: °? Return to work or school. °? Resume usual physical activities or sports. °? Resume sexual activity. °General instructions °· If the catheter site starts to bleed, raise your arm and put firm pressure on the site. If the bleeding does not stop, get help right away. This is a medical emergency. °· If you went home on the same day as your procedure, a responsible adult should be with you for the first 24 hours after you arrive home. °· Keep all follow-up visits as told by your health care provider. This is important. °Contact a health care provider if: °· You have a fever. °· You have redness, swelling, or yellow drainage around your insertion site. °Get help right away if: °· You have unusual pain at the radial site. °· The catheter insertion area swells very fast. °· The insertion area is bleeding, and the bleeding does not stop when you hold steady pressure on the area. °· Your arm or hand becomes pale, cool, tingly, or numb. °These symptoms may represent a serious problem   that is an emergency. Do not wait to see if the symptoms will go away. Get medical help right away. Call your local emergency services (911 in the [place] Do not drive yourself to the hospital. °Summary °· After the procedure, it is common to have bruising and tenderness at the site. °· Follow instructions from your health care provider about how to take care of your radial site wound. Check the wound every day for signs of infection. °· Do not lift anything that is heavier than 10 lb (4.5 kg), or the limit that you are told, until your health care provider says  that it is safe. °This information is not intended to replace advice given to you by your health care provider. Make sure you discuss any questions you have with your health care provider. °Document Released: 02/25/2010 Document Revised: 02/28/2017 Document Reviewed: 02/28/2017 °Elsevier Interactive Patient Education © 2019 [employer] ° °

## 2018-02-28 NOTE — Interval H&P Note (Signed)
History and Physical Interval Note:  02/28/2018 1:34 PM  [name]  has presented today for surgery, with the diagnosis of as  The various methods of treatment have been discussed with the patient and family. After consideration of risks, benefits and other options for treatment, the patient has consented to  Procedure(s): RIGHT/LEFT HEART CATH AND CORONARY ANGIOGRAPHY (N/A) as a surgical intervention .  The patient's history has been reviewed, patient examined, no change in status, stable for surgery.  I have reviewed the patient's chart and labs.  Questions were answered to the patient's satisfaction.     Sherren Mocha

## 2018-03-01 ENCOUNTER — Encounter (HOSPITAL_COMMUNITY): Payer: Self-pay | Admitting: Cardiovascular Disease

## 2018-03-04 DIAGNOSIS — J209 Acute bronchitis, unspecified: Secondary | ICD-10-CM | POA: Diagnosis not present

## 2018-03-04 DIAGNOSIS — J069 Acute upper respiratory infection, unspecified: Secondary | ICD-10-CM | POA: Diagnosis not present

## 2018-03-04 DIAGNOSIS — J45909 Unspecified asthma, uncomplicated: Secondary | ICD-10-CM | POA: Diagnosis not present

## 2018-03-04 NOTE — Telephone Encounter (Signed)
He does not need Lovenox bridging

## 2018-03-06 ENCOUNTER — Encounter: Payer: Self-pay | Admitting: Thoracic Surgery (Cardiothoracic Vascular Surgery)

## 2018-03-06 ENCOUNTER — Institutional Professional Consult (permissible substitution) (INDEPENDENT_AMBULATORY_CARE_PROVIDER_SITE_OTHER): Payer: Medicare Other | Admitting: Thoracic Surgery (Cardiothoracic Vascular Surgery)

## 2018-03-06 VITALS — BP 140/82 | HR 60 | Resp 20 | Ht 72.0 in | Wt 233.0 lb

## 2018-03-06 DIAGNOSIS — I350 Nonrheumatic aortic (valve) stenosis: Secondary | ICD-10-CM | POA: Diagnosis not present

## 2018-03-06 DIAGNOSIS — I25119 Atherosclerotic heart disease of native coronary artery with unspecified angina pectoris: Secondary | ICD-10-CM

## 2018-03-06 NOTE — Progress Notes (Signed)
[facility]  MULTIDISCIPLINARY HEART VALVE CLINIC  CARDIOTHORACIC SURGERY CONSULTATION REPORT  Referring Provider is Richardo Priest, MD  PCP is Shirline Frees, MD  Chief Complaint  Patient presents with  . Aortic Stenosis    Surgical eval for TAVR, cardiac cath 02/28/18, PFT's 02/27/18, CT's scheduled for 03/07/18     HPI:  Patient is an 85 year old male with history of aortic stenosis, coronary artery disease status post acute MI in 2011, paroxysmal atrial fibrillation, AV block status post permanent pacemaker placement, recurrent pulmonary emboli and 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  Patient's cardiac history dates back to 2011 when he presented with an acute inferior wall myocardial infarction.  He was found to have multivessel coronary artery disease and treated with PCI and stenting of the distal left circumflex coronary artery.  He has been followed regularly ever since by Dr. Wynonia Lawman until recently when he has been followed by Dr. Bettina Gavia.  He was noted to have a heart murmur and echocardiograms have documented the presence of aortic stenosis that has progressed in severity over time.  He had bilateral pulmonary emboli in 2013.  He underwent placement of [place] filter and has been anticoagulated using warfarin off and on ever since.  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  In 2016 he developed paroxysmal atrial fibrillation.  He later developed progressive AV block with intermittent complete heart block and ultimately underwent permanent pacemaker implantation  by Dr. Lovena Le in February 2019.  Most recent follow-up transthoracic echocardiogram performed November 28, 2017 revealed moderate left ventricular systolic dysfunction with ejection fraction estimated 40 to 45%.  By report there was "moderate aortic stenosis" based upon peak velocity across the aortic valve measuring 3.6 m/s corresponding to mean transvalvular gradient estimated 29 mmHg.  The DVI was reported 0.22 and the aortic valve area estimated 0.92 cm.  The patient was recently seen in follow-up by Dr. Bettina Gavia who felt the patient's echocardiogram was suggestive of the presence of low gradient, low ejection fraction severe aortic stenosis.  The patient was referred to the multidisciplinary heart valve clinic and underwent diagnostic cardiac catheterization by Dr. Burt Knack on February 28, 2018.  The patient was found to have severe multivessel coronary artery disease including high-grade stenosis of the distal left circumflex coronary artery in the right coronary artery.  Hemodynamic findings were consistent with severe low gradient low ejection fraction aortic stenosis.  Mean transvalvular gradient was measured 28 mmHg with peak instantaneous gradient 39 mmHg.  Right heart catheterization was not performed.  Cardiothoracic surgical consultation was requested.    Patient is married and lives locally in [place] with his wife.  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  He reports that his only significant functional limitation is that related to symptoms of exertional shortness of breath.  He remains fully ambulatory with normal mobility.  He has had to limit himself physically for some time because of symptoms of exertional shortness of breath.  He now gets short of breath with moderate activity.  He does not experience resting shortness of breath or PND.  He has not had any chest pain or chest tightness  either with activity or at rest.  He  reports occasional mild lower extremity edema.  He reports occasional mild dizzy spells without syncope.  Within the past 10 days the patient has developed upper respiratory tract infection with productive cough and malaise.  He has not had fevers.  He was seen by his primary care physician and is currently taking a Zithromax and a prednisone taper.  He reports that symptoms seem to be improving.    Past Surgical History:  Procedure Laterality Date  . APPENDECTOMY    . BI-VENTRICULAR PACEMAKER INSERTION (CRT-P)  03/14/2017  . BIV PACEMAKER INSERTION CRT-P N/A 03/14/2017   Procedure: BIV PACEMAKER INSERTION CRT-P;  Surgeon: Evans Lance, MD;  Location: [facility];  Service: Cardiovascular;  Laterality: N/A;  . CARDIOVERSION N/A 10/08/2014   Procedure: CARDIOVERSION;  Surgeon: Jacolyn Reedy, MD;  Location: [facility];  Service: Cardiovascular;  Laterality: N/A;  . CATARACT EXTRACTION W/ INTRAOCULAR LENS  IMPLANT, BILATERAL Bilateral   . CORONARY ANGIOPLASTY WITH STENT PLACEMENT  08/25/2009   DES-mid LCx 08/2009; 30% pLAD, 25% m/dLAD, 75% ostial D1, 99% mLCx s/p DES, 25-30% p/mRCA, 40% mRCA, 30% mPDA stenoses; LVEF 50%, inf hypokinesis  . EXCISIONAL HEMORRHOIDECTOMY    . I&D KNEE WITH POLY EXCHANGE Left 10/09/2012   Procedure: IRRIGATION AND DEBRIDEMENT LEFT KNEE WITH POLY REVISION;  Surgeon: Gearlean Alf, MD;  Location: [facility];  Service: Orthopedics;  Laterality: Left;  . INGUINAL HERNIA REPAIR Right   . JOINT REPLACEMENT    . KNEE ARTHROTOMY Left 10/09/2012   Procedure: LEFT KNEE ARTHROTOMY;  Surgeon: Gearlean Alf, MD;  Location: [facility];  Service: Orthopedics;  Laterality: Left;  . LEFT HEART CATH AND CORONARY ANGIOGRAPHY N/A 02/28/2018   Procedure: LEFT HEART CATH AND CORONARY ANGIOGRAPHY;  Surgeon: Sherren Mocha, MD;  Location: [facility];  Service: Cardiovascular;  Laterality: N/A;  . PROSTATECTOMY  04/25/2004  . REPLACEMENT TOTAL KNEE Bilateral   . SKIN BIOPSY      "off nose; wasn't cancer; it was tested" (03/14/2017)  . Uretheral implants     multiple for incontinence  . VENA CAVA FILTER PLACEMENT      Family History  Problem Relation Age of Onset  . Melanoma Father   . Melanoma Brother     Social History   Socioeconomic History  . Marital status: Married    Spouse name: Not on file  . Number of children: 3  . Years of education: Not on file  . Highest education level: Not on file  Occupational History  . Occupation: Retired Therapist, nutritional  Social Needs  . Financial resource strain: Not on file  . Food insecurity:    Worry: Not on file    Inability: Not on file  . Transportation needs:    Medical: Not on file    Non-medical: Not on file  Tobacco Use  . Smoking status: Former Smoker    Packs/day: 1.00    Years: 10.00    Pack years: 10.00    Types: Cigarettes    Last attempt to quit: 04/27/1961    Years since quitting: 56.8  . Smokeless tobacco: Never Used  Substance and Sexual Activity  . Alcohol use: No    Alcohol/week: 0.0 standard drinks  . Drug use: No  . Sexual activity: Not on file  Lifestyle  . Physical activity:    Days per week: Not on file    Minutes per session: Not on file  .  Stress: Not on file  Relationships  . Social connections:    Talks on phone: Not on file    Gets together: Not on file    Attends religious service: Not on file    Active member of club or organization: Not on file    Attends meetings of clubs or organizations: Not on file    Relationship status: Not on file  . Intimate partner violence:    Fear of current or ex partner: Not on file    Emotionally abused: Not on file    Physically abused: Not on file    Forced sexual activity: Not on file  Other Topics Concern  . Not on file  Social History Narrative  . Not on file    Current Outpatient Medications  Medication Sig Dispense Refill  . amLODipine (NORVASC) 2.5 MG tablet Take 2.5 mg by mouth every evening.     . Ascorbic Acid  (VITAMIN C) 1000 MG tablet Take 1,000 mg by mouth daily.    . Biotin 2500 MCG CAPS Take 2,500 mcg by mouth daily.    . cholecalciferol (VITAMIN D) 1000 UNITS tablet Take 1,000 Units by mouth daily.    . DiphenhydrAMINE HCl, Sleep, (UNISOM SLEEPGELS) 50 MG CAPS Take 50 mg by mouth at bedtime.    . docusate sodium (COLACE) 50 MG capsule Take 50 mg by mouth daily.    Marland Kitchen esomeprazole (NEXIUM) 40 MG capsule Take 40 mg by mouth daily before breakfast.     . fenofibrate 160 MG tablet Take 1 tablet (160 mg total) by mouth daily. (Patient taking differently: Take 160 mg by mouth at bedtime. ) 30 tablet 5  . furosemide (LASIX) 20 MG tablet Take 20 mg by mouth daily.     . Glucosamine HCl (GLUCOSAMINE PO) Take 2,000 mg by mouth daily.    . hydroxypropyl methylcellulose / hypromellose (ISOPTO TEARS / GONIOVISC) 2.5 % ophthalmic solution Place 1 drop into both eyes 3 (three) times daily as needed for dry eyes.    . Multiple Vitamins-Minerals (ADULT GUMMY PO) Take 1 tablet by mouth daily.    . nitroGLYCERIN (NITROSTAT) 0.4 MG SL tablet Place 1 tablet (0.4 mg total) under the tongue every 5 (five) minutes as needed. (Patient taking differently: Place 0.4 mg under the tongue every 5 (five) minutes as needed for chest pain. ) 25 tablet 2  . Omega-3 Fatty Acids (FISH OIL) 1000 MG CAPS Take 1,000 mg by mouth daily.    . pravastatin (PRAVACHOL) 20 MG tablet TAKE 1 TABLET BY MOUTH EVERY DAY (Patient taking differently: Take 20 mg by mouth at bedtime. ) 30 tablet 7  . vitamin B-12 (CYANOCOBALAMIN) 500 MCG tablet Take 500 mcg by mouth daily.    Marland Kitchen warfarin (COUMADIN) 2.5 MG tablet Take 2.5 mg by mouth at bedtime.      No current facility-administered medications for this visit.     Allergies  Allergen Reactions  . Darifenacin Nausea Only    dry mouth and dizziness ENABLEX  . Lisinopril Cough      Review of Systems:   General:  normal appetite, decreased energy, no weight gain, no weight loss, no  fever  Cardiac:  no chest pain with exertion, no chest pain at rest, +SOB with exertion, no resting SOB, no PND, no orthopnea, no palpitations, + arrhythmia, + atrial fibrillation, occasional mild LE edema, occasional mild dizzy spells, no syncope  Respiratory:  + shortness of breath, no home oxygen, + recent productive cough, no  dry cough, + bronchitis, no wheezing, no hemoptysis, no asthma, no pain with inspiration or cough, no sleep apnea, no CPAP at night  GI:   no difficulty swallowing, no reflux, no frequent heartburn, no hiatal hernia, no abdominal pain, no constipation, no diarrhea, no hematochezia, no hematemesis, no melena  GU:   no dysuria,  + urinary incontinence, ++ frequency, no urinary tract infection, no hematuria, no enlarged prostate, no kidney stones, no kidney disease  Vascular:  no pain suggestive of claudication, no pain in feet, occasional leg cramps, no varicose veins, + DVT, no non-healing foot ulcer  Neuro:   no stroke, no TIA's, no seizures, no headaches, no temporary blindness one eye,  no slurred speech, no peripheral neuropathy, no chronic pain, no instability of gait, no memory/cognitive dysfunction  Musculoskeletal: no arthritis, no joint swelling, no myalgias, no difficulty walking, normal mobility   Skin:   no rash, no itching, no skin infections, no pressure sores or ulcerations  Psych:   no anxiety, no depression, no nervousness, no unusual recent stress  Eyes:   no blurry vision, no floaters, + recent vision changes, no wears glasses or contacts  ENT:   + hearing loss, no loose or painful teeth, no dentures, last saw dentist within the past year  Hematologic:  + easy bruising, no abnormal bleeding, no clotting disorder, no frequent epistaxis  Endocrine:  no diabetes, does not check CBG's at home           Physical Exam:   BP 140/82   Pulse 60   Resp 20   Ht 6' (1.829 m)   Wt 233 lb (105.7 kg)   SpO2 98% Comment: RA  BMI 31.60 kg/m   General:  Mildly  obese,  well-appearing  HEENT:  Unremarkable   Neck:   no JVD, no bruits, no adenopathy   Chest:   symmetrical breath sounds, no wheezes, + few scattered rhonchi   CV:   RRR, grade II-III/VI crescendo/decrescendo murmur heard best at RSB,  no diastolic murmur  Abdomen:  soft, non-tender, no masses   Extremities:  warm, well-perfused, pulses palpable, no LE edema  Rectal/GU  Deferred  Neuro:   Grossly non-focal and symmetrical throughout  Skin:   Clean and dry, no rashes, no breakdown   Diagnostic Tests:  Echocardiography  Patient:    [name], [name] MR #:       [id number] Study Date: 11/28/2017 Gender:     M Age:        15 Height:     182.9 cm Weight:     106.6 kg BSA:        2.36 m^2 Pt. Status: Room:   ORDERING     Shirlee More, MD  [place], [place]  SONOGRAPHER  Darlina Sicilian, RDCS  cc:  ------------------------------------------------------------------- LV EF: 40% -   45%  ------------------------------------------------------------------- Indications:      Aortic stenosis 424.1.  ------------------------------------------------------------------- History:   PMH:   Atrial fibrillation.  Congestive heart failure. PMH:  DVT and Pulmonary Embolism.  Risk factors:  Hypertension. Dyslipidemia.  ------------------------------------------------------------------- Study Conclusions  - Left ventricle: The cavity size was normal. There was moderate   concentric hypertrophy. Systolic function was mildly to   moderately reduced. The estimated ejection fraction was in the   range of 40% to 45%. Hypokinesis of the inferolateral myocardium.   Doppler parameters are consistent with a reversible restrictive   pattern, indicative of decreased left ventricular diastolic  compliance and/or increased left atrial pressure (grade 3   diastolic dysfunction). - Aortic valve: There was moderate stenosis. Valve area (VTI): 0.94   cm^2. Valve area  (Vmax): 0.92 cm^2. Valve area (Vmean): 0.89   cm^2. - Left atrium: The atrium was moderately to severely dilated. - Right atrium: The atrium was moderately to severely dilated. - Pulmonary arteries: PA peak pressure: 38 mm Hg (S).  Impressions:  - Infero lateral hypokinesis.   EF 40-45%   Moderate AS.   Biatrial enlargement   Restrictive transmitral flow pattern.  ------------------------------------------------------------------- Study data:  Comparison was made to the study of 11/06/2016.  Study status:  Routine.  Procedure:  The patient reported no pain pre or post test. Transthoracic echocardiography. Image quality was adequate.  Study completion:  There were no complications. Echocardiography.  M-mode, complete 2D, spectral Doppler, and color Doppler.  Birthdate:  Patient birthdate: [birth year].  Age:  Patient is 85 yr old.  Sex:  Gender: male.    BMI: 31.9 kg/m^2.  Blood pressure:     132/72  Patient status:  Outpatient.  Study date: Study date: 11/28/2017. Study time: 03:07 PM.  Location:  Echo laboratory.  -------------------------------------------------------------------  ------------------------------------------------------------------- Left ventricle:  The cavity size was normal. There was moderate concentric hypertrophy. Systolic function was mildly to moderately reduced. The estimated ejection fraction was in the range of 40% to 45%.  Regional wall motion abnormalities:   Hypokinesis of the inferolateral myocardium. Doppler parameters are consistent with a reversible restrictive pattern, indicative of decreased left ventricular diastolic compliance and/or increased left atrial pressure (grade 3 diastolic dysfunction).  ------------------------------------------------------------------- Aortic valve:   Trileaflet; moderately thickened, moderately calcified leaflets. Mobility was not restricted.  Doppler:   There was moderate stenosis.   There was no  regurgitation.    VTI ratio of LVOT to aortic valve: 0.23. Valve area (VTI): 0.94 cm^2. Indexed valve area (VTI): 0.4 cm^2/m^2. Peak velocity ratio of LVOT to aortic valve: 0.22. Valve area (Vmax): 0.92 cm^2. Indexed valve area (Vmax): 0.39 cm^2/m^2. Mean velocity ratio of LVOT to aortic valve: 0.21. Valve area (Vmean): 0.89 cm^2. Indexed valve area (Vmean): 0.38 cm^2/m^2.    Mean gradient (S): 29 mm Hg. Peak gradient (S): 50 mm Hg.  ------------------------------------------------------------------- Aorta:  Aortic root: The aortic root was normal in size. Ascending aorta: The ascending aorta was mildly dilated.  ------------------------------------------------------------------- Mitral valve:   Structurally normal valve.   Mobility was not restricted.  Doppler:  Transvalvular velocity was within the normal range. There was no evidence for stenosis. There was trivial regurgitation.    Peak gradient (D): 4 mm Hg.  ------------------------------------------------------------------- Left atrium:  The atrium was moderately to severely dilated.   ------------------------------------------------------------------- Right ventricle:  The cavity size was normal. Wall thickness was normal. Systolic function was normal.  ------------------------------------------------------------------- Pulmonic valve:    Doppler:  Transvalvular velocity was within the normal range. There was no evidence for stenosis.  ------------------------------------------------------------------- Tricuspid valve:   Structurally normal valve.    Doppler: Transvalvular velocity was within the normal range. There was trivial regurgitation.  ------------------------------------------------------------------- Pulmonary artery:   The main pulmonary artery was normal-sized. Systolic pressure was within the normal range.  ------------------------------------------------------------------- Right atrium:  The atrium  was moderately to severely dilated.  ------------------------------------------------------------------- Pericardium:  There was no pericardial effusion.  ------------------------------------------------------------------- Systemic veins: Inferior vena cava: The vessel was normal in size.  ------------------------------------------------------------------- Measurements   Left ventricle  Value          Reference  LV ID, ED, PLAX chordal                  48.8  mm       43 - 52  LV ID, ES, PLAX chordal           (H)    39.1  mm       23 - 38  LV fx shortening, PLAX chordal    (L)    20    %        >=29  LV PW thickness, ED                      13.6  mm       ----------  IVS/LV PW ratio, ED               (H)    1.4            <=1.3  Stroke volume, 2D                        71    ml       ----------  Stroke volume/bsa, 2D                    30    ml/m^2   ----------  LV ejection fraction, 1-p A4C            50    %        ----------  LV end-diastolic volume, 2-p             147   ml       ----------  LV end-systolic volume, 2-p              81    ml       ----------  LV ejection fraction, 2-p                45    %        ----------  Stroke volume, 2-p                       67    ml       ----------  LV end-diastolic volume/bsa, 2-p         62    ml/m^2   ----------  LV end-systolic volume/bsa, 2-p          34    ml/m^2   ----------  Stroke volume/bsa, 2-p                   28.2  ml/m^2   ----------  LV e&', lateral                           6.61  cm/s     ----------  LV E/e&', lateral                         16.04          ----------  LV e&', medial                            5.39  cm/s     ----------  LV E/e&', medial  19.67          ----------  LV e&', average                           6     cm/s     ----------  LV E/e&', average                         17.67          ----------  Longitudinal strain, TDI                 8     %         ----------    Ventricular septum                       Value          Reference  IVS thickness, ED                        19    mm       ----------    LVOT                                     Value          Reference  LVOT ID, S                               23    mm       ----------  LVOT area                                4.15  cm^2     ----------  LVOT peak velocity, S                    79.1  cm/s     ----------  LVOT mean velocity, S                    53.6  cm/s     ----------  LVOT VTI, S                              17.2  cm       ----------    Aortic valve                             Value          Reference  Aortic valve peak velocity, S            355   cm/s     ----------  Aortic valve mean velocity, S            251   cm/s     ----------  Aortic valve VTI, S                      75.7  cm       ----------  Aortic mean gradient, S  29    mm Hg    ----------  Aortic peak gradient, S                  50    mm Hg    ----------  VTI ratio, LVOT/AV                       0.23           ----------  Aortic valve area, VTI                   0.94  cm^2     ----------  Aortic valve area/bsa, VTI               0.4   cm^2/m^2 ----------  Velocity ratio, peak, LVOT/AV            0.22           ----------  Aortic valve area, peak velocity         0.92  cm^2     ----------  Aortic valve area/bsa, peak              0.39  cm^2/m^2 ----------  velocity  Velocity ratio, mean, LVOT/AV            0.21           ----------  Aortic valve area, mean velocity         0.89  cm^2     ----------  Aortic valve area/bsa, mean              0.38  cm^2/m^2 ----------  velocity    Aorta                                    Value          Reference  Aortic root ID, ED                       29    mm       ----------  Ascending aorta ID, A-P, S               38    mm       ----------    Left atrium                              Value          Reference  LA ID, A-P, ES                            56    mm       ----------  LA ID/bsa, A-P                    (H)    2.38  cm/m^2   <=2.2  LA volume, S                             101   ml       ----------  LA volume/bsa, S                         42.9  ml/m^2   ----------  LA volume, ES, 1-p A4C                   124   ml       ----------  LA volume/bsa, ES, 1-p A4C               52.6  ml/m^2   ----------  LA volume, ES, 1-p A2C                   77.4  ml       ----------  LA volume/bsa, ES, 1-p A2C               32.8  ml/m^2   ----------    Mitral valve                             Value          Reference  Mitral E-wave peak velocity              106   cm/s     ----------  Mitral deceleration time                 151   ms       150 - 230  Mitral peak gradient, D                  4     mm Hg    ----------    Pulmonary arteries                       Value          Reference  PA pressure, S, DP                (H)    38    mm Hg    <=30    Tricuspid valve                          Value          Reference  Tricuspid regurg peak velocity           296   cm/s     ----------  Tricuspid peak RV-RA gradient            35    mm Hg    ----------    Right atrium                             Value          Reference  RA ID, S-I, ES, A4C               (H)    77.4  mm       34 - 49  RA area, ES, A4C                  (H)    32.2  cm^2     8.3 - 19.5  RA volume, ES, A/L                       117   ml       ----------  RA volume/bsa, ES, A/L                   49.7  ml/m^2   ----------  Systemic veins                           Value          Reference  Estimated CVP                            3     mm Hg    ----------    Right ventricle                          Value          Reference  TAPSE                                    11.4  mm       ----------  RV pressure, S, DP                (H)    38    mm Hg    <=30  RV s&', lateral, S                        8.73  cm/s     ----------  Legend: (L)  and  (H)  mark values outside specified reference  range.  ------------------------------------------------------------------- Prepared and Electronically Authenticated by  Jenne Campus, MD 2019-10-25T11:00:54   LEFT HEART CATH AND CORONARY ANGIOGRAPHY  Conclusion     Prox RCA to Mid RCA lesion is 50% stenosed.  Mid RCA lesion is 75% stenosed.  Dist RCA lesion is 50% stenosed.  Ost 3rd Mrg lesion is 100% stenosed.  Ost 2nd Mrg to 2nd Mrg lesion is 80% stenosed.  Prox Cx to Mid Cx lesion is 20% stenosed.  There is severe aortic valve stenosis.   1.  Severe calcific aortic stenosis with mean transvalvular gradient 28 mmHg 2.  Two-vessel coronary artery disease involving the left circumflex and RCA, patency of the left main and LAD 3.  Known mild to moderate LV systolic dysfunction by echo assessment  Recommendations: The patient has moderately severe stenosis of the mid RCA.  This is compared to his 2011 cath films and it has progressed.  However, the patient has no symptoms of angina.  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  The second OM subbranch has severe stenosis as well.  I think it would be reasonable to treat his coronary disease medically and proceed with evaluation and treatment of his severe aortic stenosis.  Will review with the multidisciplinary heart valve team.  Patient is stable for discharge today.  He can resume warfarin at his previous dose tonight.  Will arrange his follow-up.   Indications   Severe aortic stenosis [I35.0 (ICD-10-CM)]  Procedural Details   Technical Details INDICATION: Severe aortic stenosis. This is an 85 year old male with mild to moderate LV systolic dysfunction and ischemic cardiomyopathy who is developed progressive aortic stenosis, now with New York Heart Association functional class III symptoms. He has no angina but describes limitation secondary to shortness of breath. His noninvasive assessment is included an  echocardiogram demonstrating severe calcification of the aortic valve. I have personally reviewed the images which demonstrate severe calcification and restriction of all 3 aortic valve leaflets, a dimensionless index  of 0.2, and peak and mean gradients of 58 and 36 mmHg, respectively. The patient's calculated aortic valve area 0.79 cm. He is referred for right and left heart catheterization in preparation for aortic valve intervention.  PROCEDURAL DETAILS: There was an indwelling IV in a right antecubital vein. Using normal sterile technique, the IV was changed out for a 5 Fr brachial sheath over a 0.018 inch wire. The right wrist was then prepped, draped, and anesthetized with 1% lidocaine. Using the modified Seldinger technique a 5/6 French Slender sheath was placed in the right radial artery. Intra-arterial verapamil was administered through the radial artery sheath. IV heparin was administered after a JR4 catheter was advanced into the central aorta. Attempts were made to advance a 5 [place] Swan-Ganz catheter through the antecubital sheath, but this was unsuccessful. A venogram is performed and demonstrates the sheath access in a small vein with marked tortuosity, unable to proceed with right heart catheterization. Attention is then turned to the left heart catheterization. Standard Judkins catheters were used for selective coronary angiography. An AL-1 catheter is used to direct a straight-tip wire across the aortic valve with a moderate amount of difficulty. The catheter is advanced and LV pressure is recorded. A pullback is recorded. LV pressure is recorded and an aortic valve pullback is performed. There were no immediate procedural complications. The patient was transferred to the post catheterization recovery area for further monitoring.    Estimated blood loss <50 mL.   During this procedure medications were administered to achieve and maintain moderate conscious sedation while the patient's  heart rate, blood pressure, and oxygen saturation were continuously monitored and I was present face-to-face 100% of this time.  Medications  (Filter: Administrations occurring from 02/28/18 1332 to 02/28/18 1420)  Medication Rate/Dose/Volume Action  Date Time   fentaNYL (SUBLIMAZE) injection (mcg) 25 mcg Given 02/28/18 1344   Total dose as of 03/06/18 1723        25 mcg        midazolam (VERSED) injection (mg) 1 mg Given 02/28/18 1345   Total dose as of 03/06/18 1723        1 mg        Heparin (Porcine) in NaCl 1000-0.9 UT/500ML-% SOLN (mL) 500 mL Given 02/28/18 1345   Total dose as of 03/06/18 1723 500 mL Given 1345   1,000 mL        lidocaine (PF) (XYLOCAINE) 1 % injection (mL) 2 mL Given 02/28/18 1351   Total dose as of 03/06/18 1723 2 mL Given 1352   4 mL        Radial Cocktail/Verapamil only (mL) 10 mL Given 02/28/18 1354   Total dose as of 03/06/18 1723        10 mL        heparin injection (Units) 5,000 Units Given 02/28/18 1404   Total dose as of 03/06/18 1723        5,000 Units        iohexol (OMNIPAQUE) 350 MG/ML injection (mL) 45 mL Given 02/28/18 1417   Total dose as of 03/06/18 1723        45 mL        Sedation Time   Sedation Time Physician-1: 28 minutes 9 seconds  Coronary Findings   Diagnostic  Dominance: Right  Left Main  Vessel is angiographically normal.  Left Anterior Descending  The vessel exhibits minimal luminal irregularities.  Left Circumflex  Prox Cx to Mid Cx lesion 20% stenosed  Prox  Cx to Mid Cx lesion is 20% stenosed. The lesion was previously treated using a drug eluting stent over 2 years ago.  Second Obtuse Marginal Branch  Ost 2nd Mrg to 2nd Mrg lesion 80% stenosed  Ost 2nd Mrg to 2nd Mrg lesion is 80% stenosed. ostial/proximal branch lesion, small to medium caliber vessel  Third Obtuse Marginal Branch  Collaterals  3rd Mrg filled by collaterals from 1st RPLB.    Collaterals  3rd Mrg filled by collaterals from RPDA.    Ost 3rd Mrg  lesion 100% stenosed  Ost 3rd Mrg lesion is 100% stenosed.  Right Coronary Artery  Prox RCA to Mid RCA lesion 50% stenosed  Prox RCA to Mid RCA lesion is 50% stenosed.  Mid RCA lesion 75% stenosed  Mid RCA lesion is 75% stenosed.  Dist RCA lesion 50% stenosed  Dist RCA lesion is 50% stenosed.  Intervention   No interventions have been documented.  Left Heart   Aortic Valve There is severe aortic valve stenosis. There is restricted aortic valve motion. The peak instantaneous transaortic valve gradient is 39 mmHg, peak to peak gradient 32 mmHg, mean gradient 28 mmHg.  Coronary Diagrams   Diagnostic  Dominance: Right    Intervention   Implants    No implant documentation for this case.  Syngo Images   Show images for CARDIAC CATHETERIZATION  MERGE Images   Show images for CARDIAC CATHETERIZATION   Link to Procedure Log   Procedure Log    Hemo Data    Most Recent Value  Aortic Mean Gradient 28.04 mmHg  Aortic Peak Gradient 32 mmHg  AO Systolic Pressure 509 mmHg  AO Diastolic Pressure 74 mmHg  AO Mean 326 mmHg  LV Systolic Pressure 712 mmHg  LV Diastolic Pressure 20 mmHg  LV EDP 25 mmHg  AOp Systolic Pressure 458 mmHg  AOp Diastolic Pressure 72 mmHg  AOp Mean Pressure 099 mmHg  LVp Systolic Pressure 833 mmHg  LVp Diastolic Pressure 17 mmHg  LVp EDP Pressure 23 mmHg      Impression:  Patient has stage D severe symptomatic aortic stenosis and severe multivessel coronary artery disease.  He presents with a long history of chronic symptoms of exertional shortness of breath and fatigue that has slowly progressed over the last several years.  Symptoms are consistent with chronic combined systolic and diastolic congestive heart failure, New York Heart Association functional class IIb.  The patient's symptoms of exertional shortness of breath could also be related to myocardial ischemia.  I have personally reviewed the patient's recent transthoracic echocardiogram and  diagnostic cardiac catheterization.  Echocardiogram demonstrates findings consistent with severe low ejection fraction low gradient aortic stenosis.  The aortic valve is trileaflet with severe thickening, calcification, and restricted leaflet mobility involving all 3 leaflets of the aortic valve.  Visually the valve appears very tight.  Peak velocity across the aortic valve measured 3.6 m/s when it was last assessed by echo last October.  Mean transvalvular gradient was estimated 29 mmHg but the DVI was quite low and there was significant left ventricular systolic dysfunction.  Similar findings were corroborated on recent diagnostic cardiac catheterization, although right heart catheterization was not performed so that valve area could be calculated.  Catheterization also revealed severe multivessel coronary artery disease with long segment disease involving the mid right coronary artery and high-grade stenosis of the distal left circumflex coronary artery with chronic occlusion of the terminal obtuse marginal branch.  I agree the patient would likely benefit from aortic  valve replacement and coronary revascularization.  Risks associated with conventional surgical aortic valve replacement and coronary artery bypass grafting would be at least moderately elevated because of the patient's advanced age, left ventricular systolic dysfunction, and comorbid medical problems.  It might be reasonable to consider a combination of transcatheter aortic valve replacement and PCI of the patient's high-grade right coronary artery stenosis with or without PCI of the distal left circumflex as a less invasive alternative, although CT angiography has yet to be performed to evaluate the feasibility of transcatheter aortic valve replacement.  I would not favor treating the patient's aortic stenosis without planning for some type of coronary revascularization.      Plan:  The patient and his wife were counseled at length  regarding treatment alternatives for management of severe symptomatic aortic stenosis and multivessel coronary artery disease. Alternative approaches such as conventional aortic valve replacement and coronary artery bypass grafting, transcatheter aortic valve replacement and PCI, and continued medical therapy without intervention were compared and contrasted at length.  The risks associated with conventional surgical aortic valve replacement were discussed in detail, as were expectations for post-operative convalescence.  Issues specific to transcatheter aortic valve replacement were discussed including questions about long term valve durability, the potential for paravalvular leak, possible increased risk of need for permanent pacemaker placement, and other technical complications related to the procedure itself.  Long-term prognosis with medical therapy was discussed. This discussion was placed in the context of the patient's own specific clinical presentation and past medical history.  All of their questions have been addressed.  The patient prefers to avoid the increased morbidity and risks associated with conventional surgery.  As a next step the patient will undergo CT angiography to evaluate the feasibility of transcatheter aortic valve replacement.  All of the patient's diagnostic testing will be reviewed by a multidisciplinary team of specialists.  Presuming there are no significant contraindications to a lesser invasive approach, the patient will be seen in follow-up by Dr. Burt Knack once he has recovered from his acute respiratory infection for PCI and stenting with tentative plans for transcatheter arctic valve replacement once coronary revascularization has been accomplished.   I spent in excess of 90 minutes during the conduct of this office consultation and >50% of this time involved direct face-to-face encounter with the patient for counseling and/or coordination of their care.      Valentina Gu. Roxy Manns, MD 03/06/2018 3:32 PM

## 2018-03-06 NOTE — Patient Instructions (Signed)
Continue all previous medications without any changes at this time  

## 2018-03-07 ENCOUNTER — Ambulatory Visit (HOSPITAL_BASED_OUTPATIENT_CLINIC_OR_DEPARTMENT_OTHER)
Admission: RE | Admit: 2018-03-07 | Discharge: 2018-03-07 | Disposition: A | Discharge status: Home / Self Care | Payer: Medicare Other | Source: Ambulatory Visit | Attending: Physician Assistant | Admitting: Physician Assistant

## 2018-03-07 ENCOUNTER — Ambulatory Visit (HOSPITAL_COMMUNITY)
Admission: RE | Admit: 2018-03-07 | Discharge: 2018-03-07 | Disposition: A | Discharge status: Home / Self Care | Payer: Medicare Other | Source: Ambulatory Visit | Attending: Physician Assistant | Admitting: Physician Assistant

## 2018-03-07 ENCOUNTER — Encounter (HOSPITAL_COMMUNITY)
Admission: RE | Admit: 2018-03-07 | Discharge: 2018-03-07 | Disposition: A | Discharge status: Home / Self Care | Payer: Medicare Other | Source: Ambulatory Visit | Attending: Physician Assistant | Admitting: Physician Assistant

## 2018-03-07 DIAGNOSIS — I2510 Atherosclerotic heart disease of native coronary artery without angina pectoris: Secondary | ICD-10-CM | POA: Insufficient documentation

## 2018-03-07 DIAGNOSIS — I350 Nonrheumatic aortic (valve) stenosis: Secondary | ICD-10-CM

## 2018-03-07 DIAGNOSIS — I700 Atherosclerosis of aorta: Secondary | ICD-10-CM | POA: Diagnosis not present

## 2018-03-07 DIAGNOSIS — N200 Calculus of kidney: Secondary | ICD-10-CM | POA: Diagnosis not present

## 2018-03-07 DIAGNOSIS — R942 Abnormal results of pulmonary function studies: Secondary | ICD-10-CM | POA: Diagnosis not present

## 2018-03-07 DIAGNOSIS — K8020 Calculus of gallbladder without cholecystitis without obstruction: Secondary | ICD-10-CM | POA: Diagnosis not present

## 2018-03-07 LAB — PULMONARY FUNCTION TEST
DL/VA % PRED: 101 %
DL/VA: 4.75 ml/min/mmHg/L
DLCO cor % pred: 68 %
DLCO cor: 23.99 ml/min/mmHg
DLCO unc % pred: 71 %
DLCO unc: 24.88 ml/min/mmHg
FEF 25-75 Post: 3.41 L/sec
FEF 25-75 Pre: 2.4 L/sec
FEF2575-%CHANGE-POST: 41 %
FEF2575-%Pred-Post: 173 %
FEF2575-%Pred-Pre: 122 %
FEV1-%Change-Post: 8 %
FEV1-%Pred-Post: 102 %
FEV1-%Pred-Pre: 94 %
FEV1-Post: 3.03 L
FEV1-Pre: 2.79 L
FEV1FVC-%CHANGE-POST: 4 %
FEV1FVC-%Pred-Pre: 109 %
FEV6-%Change-Post: 3 %
FEV6-%PRED-PRE: 92 %
FEV6-%Pred-Post: 95 %
FEV6-Post: 3.75 L
FEV6-Pre: 3.61 L
FEV6FVC-%Change-Post: 0 %
FEV6FVC-%Pred-Post: 107 %
FEV6FVC-%Pred-Pre: 107 %
FVC-%Change-Post: 3 %
FVC-%Pred-Post: 89 %
FVC-%Pred-Pre: 86 %
FVC-Post: 3.75 L
FVC-Pre: 3.62 L
POST FEV6/FVC RATIO: 100 %
Post FEV1/FVC ratio: 81 %
Pre FEV1/FVC ratio: 77 %
Pre FEV6/FVC Ratio: 100 %
RV % pred: 85 %
RV: 2.43 L
TLC % pred: 83 %
TLC: 6.23 L

## 2018-03-07 MED ORDER — ALBUTEROL SULFATE (2.5 MG/3ML) 0.083% IN NEBU
2.5000 mg | INHALATION_SOLUTION | Freq: Once | RESPIRATORY_TRACT | Status: AC
  Administered 2018-03-07: 2.5 mg via RESPIRATORY_TRACT

## 2018-03-07 MED ORDER — SODIUM BICARBONATE BOLUS VIA INFUSION
INTRAVENOUS | Status: AC
  Administered 2018-03-07: 1 meq via INTRAVENOUS
  Filled 2018-03-07: qty 1

## 2018-03-07 MED ORDER — IOPAMIDOL (ISOVUE-370) INJECTION 76%
100.0000 mL | Freq: Once | INTRAVENOUS | Status: AC | PRN
  Administered 2018-03-07: 100 mL via INTRAVENOUS

## 2018-03-07 MED ORDER — SODIUM BICARBONATE 8.4 % IV SOLN
INTRAVENOUS | Status: DC
  Filled 2018-03-07: qty 500

## 2018-03-07 NOTE — Progress Notes (Signed)
Bilateral carotid duplex completed - preliminary results in chart review [facility] [place], [place] 03/07/2018, 10:05 AM

## 2018-03-07 NOTE — Progress Notes (Signed)
CT scan completed. Tolerated well. D/C home in wheelchair with daughter. Awake and alert. In no distress

## 2018-03-07 NOTE — Progress Notes (Signed)
Pt to CT via transport accompanied by daughter. Condition stable at this time.

## 2018-03-07 NOTE — Progress Notes (Signed)
Called and spoke to [employer], [place], in radiology. Pt's 2 hour bicarb infusion will be complete at 1530. Pt in CT now. Per Patty, they will DC pt home from there.

## 2018-03-11 ENCOUNTER — Encounter: Payer: Self-pay | Admitting: Physician Assistant

## 2018-03-12 ENCOUNTER — Ambulatory Visit (INDEPENDENT_AMBULATORY_CARE_PROVIDER_SITE_OTHER): Payer: Medicare Other

## 2018-03-12 DIAGNOSIS — I441 Atrioventricular block, second degree: Secondary | ICD-10-CM | POA: Diagnosis not present

## 2018-03-12 DIAGNOSIS — I255 Ischemic cardiomyopathy: Secondary | ICD-10-CM

## 2018-03-13 ENCOUNTER — Encounter: Payer: Medicare Other | Admitting: Thoracic Surgery (Cardiothoracic Vascular Surgery)

## 2018-03-13 ENCOUNTER — Encounter: Payer: Self-pay | Admitting: Physical Therapy

## 2018-03-13 ENCOUNTER — Ambulatory Visit: Payer: Medicare Other | Attending: Physician Assistant | Admitting: Physical Therapy

## 2018-03-13 DIAGNOSIS — R2689 Other abnormalities of gait and mobility: Secondary | ICD-10-CM | POA: Diagnosis not present

## 2018-03-13 LAB — CUP PACEART REMOTE DEVICE CHECK
Battery Remaining Longevity: 82 mo
Battery Remaining Percentage: 95.5 %
Battery Voltage: 2.98 V
Brady Statistic AP VP Percent: 49 %
Brady Statistic AP VS Percent: 1 %
Brady Statistic AS VP Percent: 50 %
Brady Statistic AS VS Percent: 1 %
Brady Statistic RA Percent Paced: 18 %
Date Time Interrogation Session: 20200204102920
Implantable Lead Implant Date: 20190206
Implantable Lead Implant Date: 20190206
Implantable Lead Implant Date: 20190206
Implantable Lead Location: 753858
Implantable Lead Location: 753859
Implantable Lead Location: 753860
Implantable Pulse Generator Implant Date: 20190206
Lead Channel Impedance Value: 480 Ohm
Lead Channel Impedance Value: 550 Ohm
Lead Channel Impedance Value: 950 Ohm
Lead Channel Pacing Threshold Amplitude: 0.375 V
Lead Channel Pacing Threshold Pulse Width: 0.5 ms
Lead Channel Pacing Threshold Pulse Width: 0.5 ms
Lead Channel Pacing Threshold Pulse Width: 0.8 ms
Lead Channel Sensing Intrinsic Amplitude: 12 mV
Lead Channel Sensing Intrinsic Amplitude: 3.1 mV
Lead Channel Setting Pacing Amplitude: 1.375
Lead Channel Setting Pacing Amplitude: 2 V
Lead Channel Setting Pacing Amplitude: 3 V
Lead Channel Setting Pacing Pulse Width: 0.5 ms
Lead Channel Setting Pacing Pulse Width: 0.8 ms
MDC IDC MSMT LEADCHNL LV PACING THRESHOLD AMPLITUDE: 2.75 V
MDC IDC MSMT LEADCHNL RV PACING THRESHOLD AMPLITUDE: 0.5 V
MDC IDC PG SERIAL: 8995812
MDC IDC SET LEADCHNL RV SENSING SENSITIVITY: 2 mV
Pulse Gen Model: 3262

## 2018-03-13 NOTE — Therapy (Signed)
[facility] [place], [place], [address] Phone: [phone]   Fax:  [phone]  Physical Therapy Evaluation  Patient Details  Name: [name] MRN: [id number] Date of Birth: Nov 09, 1931 Referring Provider (PT): Sherren Mocha MD   Encounter Date: 03/13/2018  PT End of Session - 03/13/18 1412    Visit Number  1    Number of Visits  1    Date for PT Re-Evaluation  03/13/18    PT Start Time  0093    PT Stop Time  1410    PT Time Calculation (min)  32 min    Activity Tolerance  Patient tolerated treatment well    Behavior During Therapy  [facility] for tasks assessed/performed       Past Medical History:  Diagnosis Date  . Arthritis    "knees, right shoulder" (03/14/2017)  . CAD (coronary artery disease), native coronary artery    Cath 2011 [facility] (08/2009):~ Proximal LAD 30%, mid to distal LAD 25%, ostial small D1 75% mid AV groove circumflex 99% been subtotal stenosis, proximal to mid RCA 25-30%, mid RCA 30%, mid PDA 30%, EF 50% with inferior hypokinesis.;    July 2011  PCI and DES to circumflex Dr. Olevia Perches   ETT-Myoview (06/2013):  Inferolateral scar, mild peri-infarct ischemia, EF 40%; Intermediate Risk   ECHO EF 55% 03/2013   . Cardiac pacemaker in situ 03/14/2017   Biventricular St. Jude inserted 03/14/17 Dr. Lovena Le for second degree heart block   . CKD (chronic kidney disease), stage III ([place])   . GERD   . History of infection of prosthetic knee 04/05/2015   Treated with debridement and irrigation followed by 6 months of triple antibiotics  . History of kidney stones   . History of peptic ulcer 1970s?  Marland Kitchen History of prostate cancer    Radical prostatectomy in 2006 Dr. Terance Hart   . Hypertensive heart disease   . Long term (current) use of anticoagulants 10/06/2011  . Mixed hyperlipidemia   . Moderate persistent asthma without complication 09/24/2991  . Obesity (BMI 30-39.9)   . OSA on CPAP   . PAF (paroxysmal atrial  fibrillation) ([place])    Dx 2016, placed on amiodarone  . Paroxysmal atrial fibrillation ([facility]) 09/19/2014   CHA2DS2VASC score 5  Cardioversion 10/08/2014 was on amiodarone until 2018   . 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)    Initial DVT in 2006 after prostate surgery and had [place] filter placed Bilateral  PE 2013 and placed back on warfarin DVT of right subclavian vein on doppler 10/2012 at time of knee infection   . Personal history of malignant neoplasm of prostate    Radical prostatectomy in 2006 Dr. Terance Hart    . Severe aortic stenosis   . Urinary incontinence    MULTIPLE BLADDER SURGERIES - STATES NO URINARY SPHINCTER - PT'S UROLOGIST IS AT [facility]- DR. PETERSON  ( LAST VISIT WAS 09/15/11 )    Past Surgical History:  Procedure Laterality Date  . APPENDECTOMY    . BI-VENTRICULAR PACEMAKER INSERTION (CRT-P)  03/14/2017  . BIV PACEMAKER INSERTION CRT-P N/A 03/14/2017   Procedure: BIV PACEMAKER INSERTION CRT-P;  Surgeon: Evans Lance, MD;  Location: [facility];  Service: Cardiovascular;  Laterality: N/A;  . CARDIOVERSION N/A 10/08/2014   Procedure: CARDIOVERSION;  Surgeon: Jacolyn Reedy, MD;  Location: [facility];  Service: Cardiovascular;  Laterality: N/A;  . CATARACT EXTRACTION W/ INTRAOCULAR LENS  IMPLANT, BILATERAL  Bilateral   . CORONARY ANGIOPLASTY WITH STENT PLACEMENT  08/25/2009   DES-mid LCx 08/2009; 30% pLAD, 25% m/dLAD, 75% ostial D1, 99% mLCx s/p DES, 25-30% p/mRCA, 40% mRCA, 30% mPDA stenoses; LVEF 50%, inf hypokinesis  . EXCISIONAL HEMORRHOIDECTOMY    . I&D KNEE WITH POLY EXCHANGE Left 10/09/2012   Procedure: IRRIGATION AND DEBRIDEMENT LEFT KNEE WITH POLY REVISION;  Surgeon: Gearlean Alf, MD;  Location: [facility];  Service: Orthopedics;  Laterality: Left;  . INGUINAL HERNIA REPAIR Right   . JOINT REPLACEMENT    . KNEE ARTHROTOMY Left 10/09/2012   Procedure: LEFT KNEE ARTHROTOMY;  Surgeon: Gearlean Alf, MD;  Location: [facility];   Service: Orthopedics;  Laterality: Left;  . LEFT HEART CATH AND CORONARY ANGIOGRAPHY N/A 02/28/2018   Procedure: LEFT HEART CATH AND CORONARY ANGIOGRAPHY;  Surgeon: Sherren Mocha, MD;  Location: [facility];  Service: Cardiovascular;  Laterality: N/A;  . PROSTATECTOMY  04/25/2004  . REPLACEMENT TOTAL KNEE Bilateral   . SKIN BIOPSY     "off nose; wasn't cancer; it was tested" (03/14/2017)  . Uretheral implants     multiple for incontinence  . VENA CAVA FILTER PLACEMENT      There were no vitals filed for this visit.   Subjective Assessment - 03/13/18 1342    Subjective  pt is a 85 y.o M with CC of SOB that began 2-3 years ago. He reports it is progressively worsening which limits his endurance. . pt Denies any pain today and rpeort she lives with his wife.    Patient Stated Goals  to get heart better         [facility] PT Assessment - 03/13/18 1337      Assessment   Medical Diagnosis  Severe aortic Stenosis    Referring Provider (PT)  Sherren Mocha MD    Onset Date/Surgical Date  --   2-3 years   Hand Dominance  Right      Precautions   Precautions  None      Restrictions   Weight Bearing Restrictions  No      Balance Screen   Has the patient fallen in the past 6 months  No      [place] residence    Living Arrangements  Spouse/significant other    Available Help at Discharge  Family;Available PRN/intermittently    Type of Home  House    Home Layout  One level    Home Equipment  None      Prior Function   Level of Independence  Independent with basic ADLs    Vocation  Retired      Associate Professor   Overall Cognitive Status  Within Functional Limits for tasks assessed      Posture/Postural Control   Posture/Postural Control  Postural limitations    Postural Limitations  Forward head;Rounded Shoulders      ROM / Strength   AROM / PROM / Strength  AROM;Strength      AROM   Overall AROM   Within functional limits for tasks  performed    Overall AROM Comments  limited ER and IR on the R compared bil      Strength   Overall Strength  Within functional limits for tasks performed    Strength Assessment Site  Hand    Right Hand Grip (lbs)  82    Left Hand Grip (lbs)  68      Ambulation/Gait   Ambulation/Gait  Yes    Gait Pattern  Step-through pattern;Decreased stride length;Trendelenburg;Antalgic;Wide base of support    Gait Comments  pt ambulated 555 ft in 2:55 requiring 1:38 rest break before ambulating and addtional 251ft        [facility] Pre-Surgical Assessment - 03/13/18 0001    5 Meter Walk Test- trial 1  5 sec    5 Meter Walk Test- trial 2  5 sec.     5 Meter Walk Test- trial 3  4 sec.    5 meter walk test average  4.67 sec    4 Stage Balance Test tolerated for:   10 sec.    4 Stage Balance Test Position  2    Sit To Stand Test- trial 1  9 sec.    ADL/IADL Independent with:  Bathing;Dressing;Meal prep;Finances    ADL/IADL Needs Assistance with:  [employer]alla Leaver work    ADL/IADL Fraility Index  Midly frail    6 Minute Walk- Baseline  yes    BP (mmHg)  124/69    HR (bpm)  60    02 Sat (%RA)  99 %    Modified Borg Scale for Dyspnea  1- Very mild shortness of breath    Perceived Rate of Exertion (Borg)  7- Very, very light    6 Minute Walk Post Test  yes    BP (mmHg)  151/84    HR (bpm)  102    02 Sat (%RA)  82 %    Modified Borg Scale for Dyspnea  5- Strong or hard breathing    Perceived Rate of Exertion (Borg)  15- Hard    Aerobic Endurance Distance Walked  832    Endurance additional comments  pt demonstrates 39.18 % limitation compared to age related norm              Objective measurements completed on examination: See above findings.                           Plan - 03/13/18 1412    Clinical Impression Statement  see assessment in note    Clinical Presentation  Stable    Clinical Decision Making  Low    PT Frequency  One time visit    PT Next Visit Plan  Pre-TAVR        Clinical Impression Statement: Pt is a 85 yo M presenting to OP PT for evaluation prior to possible TAVR surgery due to severe aortic stenosis. Pt reports onset of SOB/ fatgieu approximately 2-3  Years ago. Symptoms are limiting prolonged standing/walking. Pt presents with functional ROM with minor R shoulder internal/ external rotation and strength, limited balance and is moderate at high fall risk 4 stage balance test, good walking speed and limited aerobic endurance per 6 minute walk test. pt ambulated 555 ft in 2:55 requiring 1:38 rest break  At time of rest, patient's HR was 102 bpm and O2 was 82% on room air. Pt reported 5/10 shortness of breath on modified scale for dyspnea. Pt able to resume after rest and ambulate an additional  236ft feet. Pt ambulated a total of 832 feet in 6 minute walk. SOB and fatigue increased significantly with 6 minute walk test. Based on the Short Physical Performance Battery, patient has a frailty rating of 9/12 with </= 5/12 considered frail.    Patient demonstrated the following deficits and impairments:     Visit Diagnosis: Other abnormalities of gait and mobility  Problem List Patient Active Problem List   Diagnosis Date Noted  . Chronic diastolic heart failure ([place]) 11/19/2017  . Chronic kidney disease (CKD) 11/12/2017  . Heart block AV second degree 03/14/2017  . Cardiac pacemaker in situ 03/14/2017  . OSA treated with BiPAP 11/15/2015  . History of infection of prosthetic knee 04/05/2015  . Persistent atrial fibrillation 09/19/2014  . Moderate persistent asthma without complication 12/82/0813  . Urinary incontinence 03/04/2014  . Obesity (BMI 30-39.9)   . Coronary artery disease involving native coronary artery of native heart with angina pectoris ([place])   . Aortic stenosis   . History of infection of prosthetic left knee joint   . Long term (current) use of anticoagulants 10/06/2011  . Myocardial infarction ([place]) 09/14/2011  . Mixed  hyperlipidemia 02/24/2010  . Gastro-esophageal reflux disease without esophagitis 09/24/2009  . Personal history of malignant neoplasm of prostate   . Hypertensive heart disease   . 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)    Starr Lake PT, DPT, LAT, ATC  03/13/18  2:16 PM      [facility] [address] [place], [place], [address] Phone: [phone]   Fax:  [phone]  Name: [name] MRN: [id number] Date of Birth: [birth year]

## 2018-03-14 ENCOUNTER — Ambulatory Visit (INDEPENDENT_AMBULATORY_CARE_PROVIDER_SITE_OTHER): Payer: Medicare Other | Admitting: Cardiovascular Disease

## 2018-03-14 ENCOUNTER — Encounter: Payer: Self-pay | Admitting: Cardiovascular Disease

## 2018-03-14 VITALS — BP 122/70 | HR 61 | Ht 72.0 in | Wt 234.0 lb

## 2018-03-14 DIAGNOSIS — I350 Nonrheumatic aortic (valve) stenosis: Secondary | ICD-10-CM | POA: Diagnosis not present

## 2018-03-14 DIAGNOSIS — I25119 Atherosclerotic heart disease of native coronary artery with unspecified angina pectoris: Secondary | ICD-10-CM | POA: Diagnosis not present

## 2018-03-14 MED ORDER — ASPIRIN EC 81 MG PO TBEC: 81.0000 mg | DELAYED_RELEASE_TABLET | Freq: Every day | ORAL | 3 refills | Status: DC

## 2018-03-14 MED ORDER — CLOPIDOGREL BISULFATE 75 MG PO TABS: 75.0000 mg | ORAL_TABLET | Freq: Every day | ORAL | 0 refills | Status: DC

## 2018-03-14 NOTE — Progress Notes (Signed)
Cardiology Office Note:    Date:  03/14/2018   ID:  [name], DOB Jul 01, 1931, MRN [id number]  PCP:  Noah Frees, MD  Cardiologist:  No primary care provider on file.  Electrophysiologist:  None   Referring MD: Noah Frees, MD   Chief Complaint  Patient presents with  . Shortness of Breath   History of Present Illness:    [name] is a 85 y.o. male with a hx of coronary artery disease, aortic stenosis, and paroxysmal atrial fibrillation, presenting for follow-up evaluation.  The patient has undergone permanent pacemaker placement for AV block and has been maintained on anticoagulation with recurrent pulmonary emboli and DVT.  He has developed progressive and now severe symptomatic aortic stenosis and was referred for multidisciplinary heart valve clinic evaluation.  He underwent cardiac catheterization confirming severe aortic stenosis but also demonstrating severe coronary artery disease involving a long diffuse lesion in the mid RCA and a severe lesion in the distal circumflex branch.  He has undergone further evaluation now including CT angiogram studies of the heart as well as the chest, abdomen, and pelvis.  He was evaluated by Dr. Roxy [name] and felt to be a suitable candidate for TAVR after undergoing PCI of severe stenosis in the RCA.  The patient is here with his wife today.  He continues to have exertional dyspnea which is his primary limitation.  He denies chest pain or pressure.  He has occasional dizziness.  He has no orthopnea or PND.  He is recently had an upper respiratory tract infection and complains of cough and malaise with this, but his symptoms have now essentially resolved.  Past Medical History:  Diagnosis Date  . Arthritis    "knees, right shoulder" (03/14/2017)  . CAD (coronary artery disease), native coronary artery    Cath 2011 LHC (08/2009):~ Proximal LAD 30%, mid to distal LAD 25%, ostial small D1 75% mid AV groove circumflex 99% been subtotal  stenosis, proximal to mid RCA 25-30%, mid RCA 30%, mid PDA 30%, EF 50% with inferior hypokinesis.;    July 2011  PCI and DES to circumflex [name]   ETT-Myoview (06/2013):  Inferolateral scar, mild peri-infarct ischemia, EF 40%; Intermediate Risk   ECHO EF 55% 03/2013   . Cardiac pacemaker in situ 03/14/2017   Biventricular St. Jude inserted 03/14/17 [name] for second degree heart block   . CKD (chronic kidney disease), stage III ([place])   . GERD   . History of infection of prosthetic knee 04/05/2015   Treated with debridement and irrigation followed by 6 months of triple antibiotics  . History of kidney stones   . History of peptic ulcer 1970s?  Marland Kitchen History of prostate cancer    Radical prostatectomy in 2006 [name]   . Hypertensive heart disease   . Long term (current) use of anticoagulants 10/06/2011  . Mixed hyperlipidemia   . Moderate persistent asthma without complication 3/55/7322  . Obesity (BMI 30-39.9)   . OSA on CPAP   . PAF (paroxysmal atrial fibrillation) ([place])    Dx 2016, placed on amiodarone  . Paroxysmal atrial fibrillation ([facility]) 09/19/2014   CHA2DS2VASC score 5  Cardioversion 10/08/2014 was on amiodarone until 2018   . 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)    Initial DVT in 2006 after prostate surgery and had [place] filter placed Bilateral  PE 2013 and placed back on warfarin DVT of right subclavian vein on doppler 10/2012 at time of knee  infection   . Personal history of malignant neoplasm of prostate    Radical prostatectomy in 2006 [name]    . Severe aortic stenosis   . Urinary incontinence    MULTIPLE BLADDER SURGERIES - STATES NO URINARY SPHINCTER - PT'S UROLOGIST IS AT [facility]- [name]  ( LAST VISIT WAS 09/15/11 )    Past Surgical History:  Procedure Laterality Date  . APPENDECTOMY    . BI-VENTRICULAR PACEMAKER INSERTION (CRT-P)  03/14/2017  . BIV PACEMAKER INSERTION CRT-P N/A 03/14/2017   Procedure: BIV PACEMAKER  INSERTION CRT-P;  Surgeon: Evans Lance, MD;  Location: [place] [facility];  Service: Cardiovascular;  Laterality: N/A;  . CARDIOVERSION N/A 10/08/2014   Procedure: CARDIOVERSION;  Surgeon: Noah Reedy, MD;  Location: [facility] ENDOSCOPY;  Service: Cardiovascular;  Laterality: N/A;  . CATARACT EXTRACTION W/ INTRAOCULAR LENS  IMPLANT, BILATERAL Bilateral   . CORONARY ANGIOPLASTY WITH STENT PLACEMENT  08/25/2009   DES-mid LCx 08/2009; 30% pLAD, 25% m/dLAD, 75% ostial D1, 99% mLCx s/p DES, 25-30% p/mRCA, 40% mRCA, 30% mPDA stenoses; LVEF 50%, inf hypokinesis  . EXCISIONAL HEMORRHOIDECTOMY    . I&D KNEE WITH POLY EXCHANGE Left 10/09/2012   Procedure: IRRIGATION AND DEBRIDEMENT LEFT KNEE WITH POLY REVISION;  Surgeon: Noah Alf, MD;  Location: [facility];  Service: Orthopedics;  Laterality: Left;  . INGUINAL HERNIA REPAIR Right   . JOINT REPLACEMENT    . KNEE ARTHROTOMY Left 10/09/2012   Procedure: LEFT KNEE ARTHROTOMY;  Surgeon: Noah Alf, MD;  Location: [facility];  Service: Orthopedics;  Laterality: Left;  . LEFT HEART CATH AND CORONARY ANGIOGRAPHY N/A 02/28/2018   Procedure: LEFT HEART CATH AND CORONARY ANGIOGRAPHY;  Surgeon: Noah Mocha, MD;  Location: [place] [facility];  Service: Cardiovascular;  Laterality: N/A;  . PROSTATECTOMY  04/25/2004  . REPLACEMENT TOTAL KNEE Bilateral   . SKIN BIOPSY     "off nose; wasn't cancer; it was tested" (03/14/2017)  . Uretheral implants     multiple for incontinence  . VENA CAVA FILTER PLACEMENT      Current Medications: Current Meds  Medication Sig  . amLODipine (NORVASC) 2.5 MG tablet Take 2.5 mg by mouth every evening.   . Ascorbic Acid (VITAMIN C) 1000 MG tablet Take 1,000 mg by mouth daily.  . Biotin 2500 MCG CAPS Take 2,500 mcg by mouth daily.  . cholecalciferol (VITAMIN D) 1000 UNITS tablet Take 1,000 Units by mouth daily.  . DiphenhydrAMINE HCl, Sleep, (UNISOM SLEEPGELS) 50 MG CAPS Take 50 mg by mouth at bedtime.  . docusate sodium (COLACE) 50  MG capsule Take 50 mg by mouth daily.  Marland Kitchen esomeprazole (NEXIUM) 40 MG capsule Take 40 mg by mouth daily before breakfast.   . fenofibrate 160 MG tablet Take 1 tablet (160 mg total) by mouth daily.  . furosemide (LASIX) 20 MG tablet Take 20 mg by mouth daily.   . Glucosamine HCl (GLUCOSAMINE PO) Take 2,000 mg by mouth daily.  . hydroxypropyl methylcellulose / hypromellose (ISOPTO TEARS / GONIOVISC) 2.5 % ophthalmic solution Place 1 drop into both eyes 3 (three) times daily as needed for dry eyes.  . Multiple Vitamins-Minerals (ADULT GUMMY PO) Take 1 tablet by mouth daily.  . nitroGLYCERIN (NITROSTAT) 0.4 MG SL tablet Place 1 tablet (0.4 mg total) under the tongue every 5 (five) minutes as needed.  . Omega-3 Fatty Acids (FISH OIL) 1000 MG CAPS Take 1,000 mg by mouth daily.  . pravastatin (PRAVACHOL) 20 MG tablet TAKE 1 TABLET  BY MOUTH EVERY DAY  . vitamin B-12 (CYANOCOBALAMIN) 500 MCG tablet Take 500 mcg by mouth daily.  Marland Kitchen warfarin (COUMADIN) 2.5 MG tablet Take 2.5 mg by mouth at bedtime.      Allergies:   Darifenacin and Lisinopril   Social History   Socioeconomic History  . Marital status: Married    Spouse name: Not on file  . Number of children: 3  . Years of education: Not on file  . Highest education level: Not on file  Occupational History  . Occupation: Retired Therapist, nutritional  Social Needs  . Financial resource strain: Not on file  . Food insecurity:    Worry: Not on file    Inability: Not on file  . Transportation needs:    Medical: Not on file    Non-medical: Not on file  Tobacco Use  . Smoking status: Former Smoker    Packs/day: 1.00    Years: 10.00    Pack years: 10.00    Types: Cigarettes    Last attempt to quit: 04/27/1961    Years since quitting: 56.9  . Smokeless tobacco: Never Used  Substance and Sexual Activity  . Alcohol use: No    Alcohol/week: 0.0 standard drinks  . Drug use: No  . Sexual activity: Not on file  Lifestyle  . Physical activity:    Days  per week: Not on file    Minutes per session: Not on file  . Stress: Not on file  Relationships  . Social connections:    Talks on phone: Not on file    Gets together: Not on file    Attends religious service: Not on file    Active member of club or organization: Not on file    Attends meetings of clubs or organizations: Not on file    Relationship status: Not on file  Other Topics Concern  . Not on file  Social History Narrative  . Not on file     Family History: The patient's family history includes Melanoma in his brother and father.  ROS:   Please see the history of present illness.    Positive for hearing loss, cough, dizziness, excessive sweating.  All other systems reviewed and are negative.  EKGs/Labs/Other Studies Reviewed:    The following studies were reviewed today: Cardiac Cath: Conclusion     Prox RCA to Mid RCA lesion is 50% stenosed.  Mid RCA lesion is 75% stenosed.  Dist RCA lesion is 50% stenosed.  Ost 3rd Mrg lesion is 100% stenosed.  Ost 2nd Mrg to 2nd Mrg lesion is 80% stenosed.  Prox Cx to Mid Cx lesion is 20% stenosed.  There is severe aortic valve stenosis.   1.  Severe calcific aortic stenosis with mean transvalvular gradient 28 mmHg 2.  Two-vessel coronary artery disease involving the left circumflex and RCA, patency of the left main and LAD 3.  Known mild to moderate LV systolic dysfunction by echo assessment  Recommendations: The patient has moderately severe stenosis of the mid RCA.  This is compared to his 2011 cath films and it has progressed.  However, the patient has no symptoms of angina.  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  The second OM subbranch has severe stenosis as well.  I think it would be reasonable to treat his coronary disease medically and proceed with evaluation and treatment of his severe aortic stenosis.  Will review with the multidisciplinary heart valve  team.  Patient is stable for discharge today.  He can resume warfarin at his previous dose tonight.  Will arrange his follow-up.   Coronary Diagrams   Diagnostic  Dominance: Right      Echocardiogram 11-28-2017: Study Conclusions  - Left ventricle: The cavity size was normal. There was moderate   concentric hypertrophy. Systolic function was mildly to   moderately reduced. The estimated ejection fraction was in the   range of 40% to 45%. Hypokinesis of the inferolateral myocardium.   Doppler parameters are consistent with a reversible restrictive   pattern, indicative of decreased left ventricular diastolic   compliance and/or increased left atrial pressure (grade 3   diastolic dysfunction). - Aortic valve: There was moderate stenosis. Valve area (VTI): 0.94   cm^2. Valve area (Vmax): 0.92 cm^2. Valve area (Vmean): 0.89   cm^2. - Left atrium: The atrium was moderately to severely dilated. - Right atrium: The atrium was moderately to severely dilated. - Pulmonary arteries: PA peak pressure: 38 mm Hg (S).  Impressions:  - Infero lateral hypokinesis.   EF 40-45%   Moderate AS.   Biatrial enlargement   Restrictive transmitral flow pattern.  CTA Heart: FINDINGS: Aortic Valve: Trileaflet aortic valve with severely thickened and calcified leaflets, moderately restricted leaflet opening and asymmetric calcifications extending into the LVOT under the non-coronary cusp.  Aorta: Normal size with moderate diffuse atherosclerotic plaque and calcifications predominantly in the aortic arch and descending aorta and no dissection.  Sinotubular Junction: 32 x 32 mm  Ascending Thoracic Aorta: 40 x 39 mm  Aortic Arch: 33.3 x 29.4 mm  Descending Thoracic Aorta: 31 x 30 mm  Sinus of Valsalva Measurements:  Non-coronary: 32 mm  Right -coronary: 35 mm  Left -coronary: 35 mm  Coronary Artery Height above Annulus:  Left Main: 11 mm  Right Coronary: 19  mm  Virtual Basal Annulus Measurements:  Maximum/Minimum Diameter: 30.9 x 25.6 mm  Perimeter: 90.2 mm  Area: 619 mm2  Optimum Fluoroscopic Angle for Delivery: LAO 1 CAU 1  IMPRESSION: 1. Trileaflet aortic valve with severely thickened and calcified leaflets, moderately restricted leaflet opening and asymmetric calcifications extending into the LVOT under the non-coronary cusp. Annular measurements suitable for delivery of a 29 mm Edwards-SAPIEN 3 valve.  2. Sufficient annulus to RCA distance, borderline annulus to LM distance (11 mm).  3. Optimum Fluoroscopic Angle for Delivery: LAO 1 CAU 1  4. The patient has a large left atrial appendage with a fairly large filling defect in the apex of the appendage including on delayed images. While this might represent poor contrast mixing, a thrombus can't be excluded. (The patient is on Warfarin).  5. Moderately dilated pulmonary artery measuring 36 mm suggestive of pulmonary hypertension.  CTA Aorta: IMPRESSION: 1. Vascular findings and measurements pertinent to potential TAVR procedure, as detailed. 2. Severe thickening and calcification of the aortic valve, compatible with the provided clinical history of severe aortic stenosis. 3. Mild cardiomegaly. 4. Three-vessel coronary atherosclerosis. 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 6. Ectatic 2.8 cm infrarenal abdominal aorta, at risk for aneurysm development. Recommend follow-up aortic ultrasound in 5 years. This recommendation follows ACR consensus guidelines: White Paper of the ACR Incidental Findings Committee II on Vascular Findings. J Am Coll Radiol 2013; 10:789-794. 7.  Aortic Atherosclerosis (ICD10-I70.0). 8. Posterior left thyroid lobe 4.1 cm nodule, stable since 2017 chest CT. Outpatient thyroid ultrasound correlation is indicated if not previously performed. This follows ACR consensus guidelines: Managing Incidental Thyroid Nodules  Detected on Imaging: White Paper of the ACR Incidental Thyroid Findings Committee. J Am Coll Radiol 2015; 12:143-150. 9. Cholelithiasis. 10. Right nephrolithiasis.  EKG:  EKG is not ordered today.    Recent Labs: 02/18/2018: BUN 32; Creatinine, Ser 1.55; Hemoglobin 16.0; Platelets 191; Potassium 4.7; Sodium 142  Recent Lipid Panel    Component Value Date/Time   CHOL 118 04/28/2011 1003   TRIG 85.0 04/28/2011 1003   HDL 34.90 (L) 04/28/2011 1003   CHOLHDL 3 04/28/2011 1003   VLDL 17.0 04/28/2011 1003   LDLCALC 66 04/28/2011 1003    Physical Exam:    VS:  BP 122/70   Pulse 61   Ht 6' (1.829 m)   Wt 234 lb (106.1 kg)   SpO2 98%   BMI 31.74 kg/m     Wt Readings from Last 3 Encounters:  03/14/18 234 lb (106.1 kg)  03/07/18 234 lb (106.1 kg)  03/06/18 233 lb (105.7 kg)     GEN: Well nourished, well developed elderly male in no acute distress HEENT: Normal NECK: No JVD; No carotid bruits LYMPHATICS: No lymphadenopathy CARDIAC: RRR, 2/6 harsh late peaking systolic murmur at the RUSB, no diastolic murmur. RESPIRATORY:  Clear to auscultation without rales, wheezing or rhonchi  ABDOMEN: Soft, non-tender, non-distended MUSCULOSKELETAL:  No edema; No deformity  SKIN: Warm and dry NEUROLOGIC:  Alert and oriented x 3 PSYCHIATRIC:  Normal affect   STS Risk Calculator: Procedure: AVR + CAB   Risk of Mortality: 4.962%  Renal Failure: 5.229%  Permanent Stroke: 1.562%  Prolonged Ventilation: 15.427%  DSW Infection: 0.290%  Reoperation: 4.980%  Morbidity or Mortality: 21.445%  Short Length of Stay: 15.054%  Long Length of Stay: 12.768%   ASSESSMENT:    1. Coronary artery disease involving native coronary artery of native heart with angina pectoris ([place])   2. Severe aortic stenosis    PLAN:    In order of problems listed above:  1. This is an 85 year old gentleman with severe, stage D aortic stenosis and multivessel coronary artery disease with New York Heart  Association functional class IIb symptoms of chronic combined systolic and diastolic heart failure.  I personally reviewed his echo and cardiac catheterization images and I agree that he has severe aortic stenosis with reduced LV systolic function.  The valve is severely calcified and restricted with a dimensionless index less than 0.25 and a mean transvalvular gradient of 29 mmHg. I have reviewed the natural history of aortic stenosis with the patient and their family members who are present today. We have discussed the limitations of medical therapy and the poor prognosis associated with symptomatic aortic stenosis. We have reviewed potential treatment options, including palliative medical therapy, conventional surgical aortic valve replacement, and transcatheter aortic valve replacement. We discussed treatment options in the context of the patient's specific comorbid medical conditions.  We plan to proceed with PCI of the right coronary artery next Monday while he is off of warfarin and will then plan to perform TAVR the following day as long as no complications arise.  The patient will be started on clopidogrel 300 mg tomorrow followed by 75 mg daily thereafter.  His CTA studies demonstrate suitable anatomy for transfemoral TAVR with a 29 mm Edwards sapien 3 valve.  His iliofemoral arteries are patent with severe external iliac tortuosity noted.  There is only mild calcification present.  We reviewed the risks, indications, and alternatives to PCI at length with plans for balloon angioplasty and stenting of the right coronary artery.  He understands these risks and agrees to proceed. I have reviewed the risks, indications, and alternatives to cardiac catheterization, possible angioplasty, and stenting with the patient. Risks include but are not limited to bleeding, infection, vascular injury, stroke, myocardial infection, arrhythmia, kidney injury, radiation-related injury in the case of prolonged fluoroscopy  use, emergency cardiac surgery, and death. The patient understands the risks of serious complication is 6-2% or less with angioplasty/stenting.    Medication Adjustments/Labs and Tests Ordered: Current medicines are reviewed at length with the patient today.  Concerns regarding medicines are outlined above.  Orders Placed This Encounter  Procedures  . Basic metabolic panel  . CBC with Differential/Platelet   Meds ordered this encounter  Medications  . clopidogrel (PLAVIX) 75 MG tablet    Sig: Take 1 tablet (75 mg total) by mouth daily for 30 days.    Dispense:  30 tablet    Refill:  0  . aspirin EC 81 MG tablet    Sig: Take 1 tablet (81 mg total) by mouth daily.    Dispense:  90 tablet    Refill:  3    Patient Instructions  Medication Instructions:  1) STOP COUMADIN 2) START PLAVIX 75 mg daily, but take 300 mg tomorrow for your first dose tomorrow 3) START ASPIRIN 81 mg daily  Testing/Procedures: Your physician has requested that you have a cardiac catheterization. Cardiac catheterization is used to diagnose and/or treat various heart conditions. Doctors may recommend this procedure for a number of different reasons. The most common reason is to evaluate chest pain. Chest pain can be a symptom of coronary artery disease (CAD), and cardiac catheterization can show whether plaque is narrowing or blocking your heart's arteries. This procedure is also used to evaluate the valves, as well as measure the blood flow and oxygen levels in different parts of your heart. For further information please visit HugeFiesta.[email or web address]. Please follow instruction sheet, as given.   Any Other Special Instructions Will Be Listed Below (If Applicable). CATHETERIZATION INSTRUCTIONS:  You are scheduled for a Cardiac Catheterization on Monday, February 10 with Dr. Sherren [name].  1. Please arrive at the [facility] (Main Entrance A) at [facility]: [address] [place], [place] [address] at 5:30  AM (This time is two hours before your procedure to ensure your preparation). Free valet parking service is available.   Special note: Every effort is made to have your procedure done on time. Please understand that emergencies sometimes delay scheduled procedures.  2. Diet: Do not eat solid foods after midnight.  The patient may have clear liquids until 5am upon the day of the procedure.  3. Labs: TODAY! CBC, BMET  4. Medication instructions in preparation for your procedure:  1) HOLD LASIX (your pee pill) the day before and morning of your catheterization  2) MAKE SURE TO TAKE YOUR PLAVIX AND ASPIRIN 81 mg the morning of your catherization  5. Plan for one night stay--bring personal belongings. 6. Bring a current list of your medications and current insurance cards. 7. You MUST have a responsible person to drive you home. 8. Someone MUST be with you the first 24 hours after you arrive home or your discharge will be delayed. 9. Please wear clothes that are easy to get on and off and wear slip-on shoes.  Thank you for allowing [place] to care for you!   -- [facility] Invasive Cardiovascular services     Signed, Noah Mocha, MD  03/14/2018 1:38 PM  [facility]

## 2018-03-14 NOTE — Patient Instructions (Addendum)
Medication Instructions:  1) STOP COUMADIN 2) START PLAVIX 75 mg daily, but take 300 mg tomorrow for your first dose tomorrow 3) START ASPIRIN 81 mg daily  Testing/Procedures: Your physician has requested that you have a cardiac catheterization. Cardiac catheterization is used to diagnose and/or treat various heart conditions. Doctors may recommend this procedure for a number of different reasons. The most common reason is to evaluate chest pain. Chest pain can be a symptom of coronary artery disease (CAD), and cardiac catheterization can show whether plaque is narrowing or blocking your heart's arteries. This procedure is also used to evaluate the valves, as well as measure the blood flow and oxygen levels in different parts of your heart. For further information please visit HugeFiesta.[email or web address]. Please follow instruction sheet, as given.   Any Other Special Instructions Will Be Listed Below (If Applicable). CATHETERIZATION INSTRUCTIONS:  You are scheduled for a Cardiac Catheterization on Monday, February 10 with Dr. Sherren Mocha.  1. Please arrive at the [facility] (Main Entrance A) at [facility]: [address] [place], [place] [address] at 5:30 AM (This time is two hours before your procedure to ensure your preparation). Free valet parking service is available.   Special note: Every effort is made to have your procedure done on time. Please understand that emergencies sometimes delay scheduled procedures.  2. Diet: Do not eat solid foods after midnight.  The patient may have clear liquids until 5am upon the day of the procedure.  3. Labs: TODAY! CBC, BMET  4. Medication instructions in preparation for your procedure:  1) HOLD LASIX (your pee pill) the day before and morning of your catheterization  2) MAKE SURE TO TAKE YOUR PLAVIX AND ASPIRIN 81 mg the morning of your catherization  5. Plan for one night stay--bring personal belongings. 6. Bring a current list of your  medications and current insurance cards. 7. You MUST have a responsible person to drive you home. 8. Someone MUST be with you the first 24 hours after you arrive home or your discharge will be delayed. 9. Please wear clothes that are easy to get on and off and wear slip-on shoes.  Thank you for allowing [place] to care for you!   -- [place] Invasive Cardiovascular services

## 2018-03-14 NOTE — H&P (View-Only) (Signed)
Cardiology Office Note:    Date:  03/14/2018   ID:  [name], DOB 08-28-1931, MRN [id number]  PCP:  Shirline Frees, MD  Cardiologist:  No primary care provider on file.  Electrophysiologist:  None   Referring MD: Shirline Frees, MD   Chief Complaint  Patient presents with  . Shortness of Breath   History of Present Illness:    [name] is a 85 y.o. male with a hx of coronary artery disease, aortic stenosis, and paroxysmal atrial fibrillation, presenting for follow-up evaluation.  The patient has undergone permanent pacemaker placement for AV block and has been maintained on anticoagulation with recurrent pulmonary emboli and DVT.  He has developed progressive and now severe symptomatic aortic stenosis and was referred for multidisciplinary heart valve clinic evaluation.  He underwent cardiac catheterization confirming severe aortic stenosis but also demonstrating severe coronary artery disease involving a long diffuse lesion in the mid RCA and a severe lesion in the distal circumflex branch.  He has undergone further evaluation now including CT angiogram studies of the heart as well as the chest, abdomen, and pelvis.  He was evaluated by Dr. Roxy Manns and felt to be a suitable candidate for TAVR after undergoing PCI of severe stenosis in the RCA.  The patient is here with his wife today.  He continues to have exertional dyspnea which is his primary limitation.  He denies chest pain or pressure.  He has occasional dizziness.  He has no orthopnea or PND.  He is recently had an upper respiratory tract infection and complains of cough and malaise with this, but his symptoms have now essentially resolved.  Past Medical History:  Diagnosis Date  . Arthritis    "knees, right shoulder" (03/14/2017)  . CAD (coronary artery disease), native coronary artery    Cath 2011 LHC (08/2009):~ Proximal LAD 30%, mid to distal LAD 25%, ostial small D1 75% mid AV groove circumflex 99% been subtotal  stenosis, proximal to mid RCA 25-30%, mid RCA 30%, mid PDA 30%, EF 50% with inferior hypokinesis.;    July 2011  PCI and DES to circumflex Dr. Olevia Perches   ETT-Myoview (06/2013):  Inferolateral scar, mild peri-infarct ischemia, EF 40%; Intermediate Risk   ECHO EF 55% 03/2013   . Cardiac pacemaker in situ 03/14/2017   Biventricular St. Jude inserted 03/14/17 Dr. Lovena Le for second degree heart block   . CKD (chronic kidney disease), stage III ([place])   . GERD   . History of infection of prosthetic knee 04/05/2015   Treated with debridement and irrigation followed by 6 months of triple antibiotics  . History of kidney stones   . History of peptic ulcer 1970s?  Marland Kitchen History of prostate cancer    Radical prostatectomy in 2006 Dr. Terance Hart   . Hypertensive heart disease   . Long term (current) use of anticoagulants 10/06/2011  . Mixed hyperlipidemia   . Moderate persistent asthma without complication 06/16/2583  . Obesity (BMI 30-39.9)   . OSA on CPAP   . PAF (paroxysmal atrial fibrillation) ([place])    Dx 2016, placed on amiodarone  . Paroxysmal atrial fibrillation ([facility]) 09/19/2014   CHA2DS2VASC score 5  Cardioversion 10/08/2014 was on amiodarone until 2018   . 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)    Initial DVT in 2006 after prostate surgery and had [place] filter placed Bilateral  PE 2013 and placed back on warfarin DVT of right subclavian vein on doppler 10/2012 at time of knee  infection   . Personal history of malignant neoplasm of prostate    Radical prostatectomy in 2006 Dr. Terance Hart    . Severe aortic stenosis   . Urinary incontinence    MULTIPLE BLADDER SURGERIES - STATES NO URINARY SPHINCTER - PT'S UROLOGIST IS AT [facility]- DR. PETERSON  ( LAST VISIT WAS 09/15/11 )    Past Surgical History:  Procedure Laterality Date  . APPENDECTOMY    . BI-VENTRICULAR PACEMAKER INSERTION (CRT-P)  03/14/2017  . BIV PACEMAKER INSERTION CRT-P N/A 03/14/2017   Procedure: BIV PACEMAKER  INSERTION CRT-P;  Surgeon: Evans Lance, MD;  Location: [facility];  Service: Cardiovascular;  Laterality: N/A;  . CARDIOVERSION N/A 10/08/2014   Procedure: CARDIOVERSION;  Surgeon: Jacolyn Reedy, MD;  Location: [facility];  Service: Cardiovascular;  Laterality: N/A;  . CATARACT EXTRACTION W/ INTRAOCULAR LENS  IMPLANT, BILATERAL Bilateral   . CORONARY ANGIOPLASTY WITH STENT PLACEMENT  08/25/2009   DES-mid LCx 08/2009; 30% pLAD, 25% m/dLAD, 75% ostial D1, 99% mLCx s/p DES, 25-30% p/mRCA, 40% mRCA, 30% mPDA stenoses; LVEF 50%, inf hypokinesis  . EXCISIONAL HEMORRHOIDECTOMY    . I&D KNEE WITH POLY EXCHANGE Left 10/09/2012   Procedure: IRRIGATION AND DEBRIDEMENT LEFT KNEE WITH POLY REVISION;  Surgeon: Gearlean Alf, MD;  Location: [facility];  Service: Orthopedics;  Laterality: Left;  . INGUINAL HERNIA REPAIR Right   . JOINT REPLACEMENT    . KNEE ARTHROTOMY Left 10/09/2012   Procedure: LEFT KNEE ARTHROTOMY;  Surgeon: Gearlean Alf, MD;  Location: [facility];  Service: Orthopedics;  Laterality: Left;  . LEFT HEART CATH AND CORONARY ANGIOGRAPHY N/A 02/28/2018   Procedure: LEFT HEART CATH AND CORONARY ANGIOGRAPHY;  Surgeon: Sherren Mocha, MD;  Location: [facility];  Service: Cardiovascular;  Laterality: N/A;  . PROSTATECTOMY  04/25/2004  . REPLACEMENT TOTAL KNEE Bilateral   . SKIN BIOPSY     "off nose; wasn't cancer; it was tested" (03/14/2017)  . Uretheral implants     multiple for incontinence  . VENA CAVA FILTER PLACEMENT      Current Medications: Current Meds  Medication Sig  . amLODipine (NORVASC) 2.5 MG tablet Take 2.5 mg by mouth every evening.   . Ascorbic Acid (VITAMIN C) 1000 MG tablet Take 1,000 mg by mouth daily.  . Biotin 2500 MCG CAPS Take 2,500 mcg by mouth daily.  . cholecalciferol (VITAMIN D) 1000 UNITS tablet Take 1,000 Units by mouth daily.  . DiphenhydrAMINE HCl, Sleep, (UNISOM SLEEPGELS) 50 MG CAPS Take 50 mg by mouth at bedtime.  . docusate sodium (COLACE) 50  MG capsule Take 50 mg by mouth daily.  Marland Kitchen esomeprazole (NEXIUM) 40 MG capsule Take 40 mg by mouth daily before breakfast.   . fenofibrate 160 MG tablet Take 1 tablet (160 mg total) by mouth daily.  . furosemide (LASIX) 20 MG tablet Take 20 mg by mouth daily.   . Glucosamine HCl (GLUCOSAMINE PO) Take 2,000 mg by mouth daily.  . hydroxypropyl methylcellulose / hypromellose (ISOPTO TEARS / GONIOVISC) 2.5 % ophthalmic solution Place 1 drop into both eyes 3 (three) times daily as needed for dry eyes.  . Multiple Vitamins-Minerals (ADULT GUMMY PO) Take 1 tablet by mouth daily.  . nitroGLYCERIN (NITROSTAT) 0.4 MG SL tablet Place 1 tablet (0.4 mg total) under the tongue every 5 (five) minutes as needed.  . Omega-3 Fatty Acids (FISH OIL) 1000 MG CAPS Take 1,000 mg by mouth daily.  . pravastatin (PRAVACHOL) 20 MG tablet TAKE 1 TABLET  BY MOUTH EVERY DAY  . vitamin B-12 (CYANOCOBALAMIN) 500 MCG tablet Take 500 mcg by mouth daily.  Marland Kitchen warfarin (COUMADIN) 2.5 MG tablet Take 2.5 mg by mouth at bedtime.      Allergies:   Darifenacin and Lisinopril   Social History   Socioeconomic History  . Marital status: Married    Spouse name: Not on file  . Number of children: 3  . Years of education: Not on file  . Highest education level: Not on file  Occupational History  . Occupation: Retired Therapist, nutritional  Social Needs  . Financial resource strain: Not on file  . Food insecurity:    Worry: Not on file    Inability: Not on file  . Transportation needs:    Medical: Not on file    Non-medical: Not on file  Tobacco Use  . Smoking status: Former Smoker    Packs/day: 1.00    Years: 10.00    Pack years: 10.00    Types: Cigarettes    Last attempt to quit: 04/27/1961    Years since quitting: 56.9  . Smokeless tobacco: Never Used  Substance and Sexual Activity  . Alcohol use: No    Alcohol/week: 0.0 standard drinks  . Drug use: No  . Sexual activity: Not on file  Lifestyle  . Physical activity:    Days  per week: Not on file    Minutes per session: Not on file  . Stress: Not on file  Relationships  . Social connections:    Talks on phone: Not on file    Gets together: Not on file    Attends religious service: Not on file    Active member of club or organization: Not on file    Attends meetings of clubs or organizations: Not on file    Relationship status: Not on file  Other Topics Concern  . Not on file  Social History Narrative  . Not on file     Family History: The patient's family history includes Melanoma in his brother and father.  ROS:   Please see the history of present illness.    Positive for hearing loss, cough, dizziness, excessive sweating.  All other systems reviewed and are negative.  EKGs/Labs/Other Studies Reviewed:    The following studies were reviewed today: Cardiac Cath: Conclusion     Prox RCA to Mid RCA lesion is 50% stenosed.  Mid RCA lesion is 75% stenosed.  Dist RCA lesion is 50% stenosed.  Ost 3rd Mrg lesion is 100% stenosed.  Ost 2nd Mrg to 2nd Mrg lesion is 80% stenosed.  Prox Cx to Mid Cx lesion is 20% stenosed.  There is severe aortic valve stenosis.   1.  Severe calcific aortic stenosis with mean transvalvular gradient 28 mmHg 2.  Two-vessel coronary artery disease involving the left circumflex and RCA, patency of the left main and LAD 3.  Known mild to moderate LV systolic dysfunction by echo assessment  Recommendations: The patient has moderately severe stenosis of the mid RCA.  This is compared to his 2011 cath films and it has progressed.  However, the patient has no symptoms of angina.  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  The second OM subbranch has severe stenosis as well.  I think it would be reasonable to treat his coronary disease medically and proceed with evaluation and treatment of his severe aortic stenosis.  Will review with the multidisciplinary heart valve  team.  Patient is stable for discharge today.  He can resume warfarin at his previous dose tonight.  Will arrange his follow-up.   Coronary Diagrams   Diagnostic  Dominance: Right      Echocardiogram 11-28-2017: Study Conclusions  - Left ventricle: The cavity size was normal. There was moderate   concentric hypertrophy. Systolic function was mildly to   moderately reduced. The estimated ejection fraction was in the   range of 40% to 45%. Hypokinesis of the inferolateral myocardium.   Doppler parameters are consistent with a reversible restrictive   pattern, indicative of decreased left ventricular diastolic   compliance and/or increased left atrial pressure (grade 3   diastolic dysfunction). - Aortic valve: There was moderate stenosis. Valve area (VTI): 0.94   cm^2. Valve area (Vmax): 0.92 cm^2. Valve area (Vmean): 0.89   cm^2. - Left atrium: The atrium was moderately to severely dilated. - Right atrium: The atrium was moderately to severely dilated. - Pulmonary arteries: PA peak pressure: 38 mm Hg (S).  Impressions:  - Infero lateral hypokinesis.   EF 40-45%   Moderate AS.   Biatrial enlargement   Restrictive transmitral flow pattern.  CTA Heart: FINDINGS: Aortic Valve: Trileaflet aortic valve with severely thickened and calcified leaflets, moderately restricted leaflet opening and asymmetric calcifications extending into the LVOT under the non-coronary cusp.  Aorta: Normal size with moderate diffuse atherosclerotic plaque and calcifications predominantly in the aortic arch and descending aorta and no dissection.  Sinotubular Junction: 32 x 32 mm  Ascending Thoracic Aorta: 40 x 39 mm  Aortic Arch: 33.3 x 29.4 mm  Descending Thoracic Aorta: 31 x 30 mm  Sinus of Valsalva Measurements:  Non-coronary: 32 mm  Right -coronary: 35 mm  Left -coronary: 35 mm  Coronary Artery Height above Annulus:  Left Main: 11 mm  Right Coronary: 19  mm  Virtual Basal Annulus Measurements:  Maximum/Minimum Diameter: 30.9 x 25.6 mm  Perimeter: 90.2 mm  Area: 619 mm2  Optimum Fluoroscopic Angle for Delivery: LAO 1 CAU 1  IMPRESSION: 1. Trileaflet aortic valve with severely thickened and calcified leaflets, moderately restricted leaflet opening and asymmetric calcifications extending into the LVOT under the non-coronary cusp. Annular measurements suitable for delivery of a 29 mm Edwards-SAPIEN 3 valve.  2. Sufficient annulus to RCA distance, borderline annulus to LM distance (11 mm).  3. Optimum Fluoroscopic Angle for Delivery: LAO 1 CAU 1  4. The patient has a large left atrial appendage with a fairly large filling defect in the apex of the appendage including on delayed images. While this might represent poor contrast mixing, a thrombus can't be excluded. (The patient is on Warfarin).  5. Moderately dilated pulmonary artery measuring 36 mm suggestive of pulmonary hypertension.  CTA Aorta: IMPRESSION: 1. Vascular findings and measurements pertinent to potential TAVR procedure, as detailed. 2. Severe thickening and calcification of the aortic valve, compatible with the provided clinical history of severe aortic stenosis. 3. Mild cardiomegaly. 4. Three-vessel coronary atherosclerosis. 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 6. Ectatic 2.8 cm infrarenal abdominal aorta, at risk for aneurysm development. Recommend follow-up aortic ultrasound in 5 years. This recommendation follows ACR consensus guidelines: White Paper of the ACR Incidental Findings Committee II on Vascular Findings. J Am Coll Radiol 2013; 10:789-794. 7.  Aortic Atherosclerosis (ICD10-I70.0). 8. Posterior left thyroid lobe 4.1 cm nodule, stable since 2017 chest CT. Outpatient thyroid ultrasound correlation is indicated if not previously performed. This follows ACR consensus guidelines: Managing Incidental Thyroid Nodules  Detected on Imaging: White Paper of the ACR Incidental Thyroid Findings Committee. J Am Coll Radiol 2015; 12:143-150. 9. Cholelithiasis. 10. Right nephrolithiasis.  EKG:  EKG is not ordered today.    Recent Labs: 02/18/2018: BUN 32; Creatinine, Ser 1.55; Hemoglobin 16.0; Platelets 191; Potassium 4.7; Sodium 142  Recent Lipid Panel    Component Value Date/Time   CHOL 118 04/28/2011 1003   TRIG 85.0 04/28/2011 1003   HDL 34.90 (L) 04/28/2011 1003   CHOLHDL 3 04/28/2011 1003   VLDL 17.0 04/28/2011 1003   LDLCALC 66 04/28/2011 1003    Physical Exam:    VS:  BP 122/70   Pulse 61   Ht 6' (1.829 m)   Wt 234 lb (106.1 kg)   SpO2 98%   BMI 31.74 kg/m     Wt Readings from Last 3 Encounters:  03/14/18 234 lb (106.1 kg)  03/07/18 234 lb (106.1 kg)  03/06/18 233 lb (105.7 kg)     GEN: Well nourished, well developed elderly male in no acute distress HEENT: Normal NECK: No JVD; No carotid bruits LYMPHATICS: No lymphadenopathy CARDIAC: RRR, 2/6 harsh late peaking systolic murmur at the RUSB, no diastolic murmur. RESPIRATORY:  Clear to auscultation without rales, wheezing or rhonchi  ABDOMEN: Soft, non-tender, non-distended MUSCULOSKELETAL:  No edema; No deformity  SKIN: Warm and dry NEUROLOGIC:  Alert and oriented x 3 PSYCHIATRIC:  Normal affect   STS Risk Calculator: Procedure: AVR + CAB   Risk of Mortality: 4.962%  Renal Failure: 5.229%  Permanent Stroke: 1.562%  Prolonged Ventilation: 15.427%  DSW Infection: 0.290%  Reoperation: 4.980%  Morbidity or Mortality: 21.445%  Short Length of Stay: 15.054%  Long Length of Stay: 12.768%   ASSESSMENT:    1. Coronary artery disease involving native coronary artery of native heart with angina pectoris ([place])   2. Severe aortic stenosis    PLAN:    In order of problems listed above:  1. This is an 85 year old gentleman with severe, stage D aortic stenosis and multivessel coronary artery disease with New York Heart  Association functional class IIb symptoms of chronic combined systolic and diastolic heart failure.  I personally reviewed his echo and cardiac catheterization images and I agree that he has severe aortic stenosis with reduced LV systolic function.  The valve is severely calcified and restricted with a dimensionless index less than 0.25 and a mean transvalvular gradient of 29 mmHg. I have reviewed the natural history of aortic stenosis with the patient and their family members who are present today. We have discussed the limitations of medical therapy and the poor prognosis associated with symptomatic aortic stenosis. We have reviewed potential treatment options, including palliative medical therapy, conventional surgical aortic valve replacement, and transcatheter aortic valve replacement. We discussed treatment options in the context of the patient's specific comorbid medical conditions.  We plan to proceed with PCI of the right coronary artery next Monday while he is off of warfarin and will then plan to perform TAVR the following day as long as no complications arise.  The patient will be started on clopidogrel 300 mg tomorrow followed by 75 mg daily thereafter.  His CTA studies demonstrate suitable anatomy for transfemoral TAVR with a 29 mm Edwards sapien 3 valve.  His iliofemoral arteries are patent with severe external iliac tortuosity noted.  There is only mild calcification present.  We reviewed the risks, indications, and alternatives to PCI at length with plans for balloon angioplasty and stenting of the right coronary artery.  He understands these risks and agrees to proceed. I have reviewed the risks, indications, and alternatives to cardiac catheterization, possible angioplasty, and stenting with the patient. Risks include but are not limited to bleeding, infection, vascular injury, stroke, myocardial infection, arrhythmia, kidney injury, radiation-related injury in the case of prolonged fluoroscopy  use, emergency cardiac surgery, and death. The patient understands the risks of serious complication is 4-7% or less with angioplasty/stenting.    Medication Adjustments/Labs and Tests Ordered: Current medicines are reviewed at length with the patient today.  Concerns regarding medicines are outlined above.  Orders Placed This Encounter  Procedures  . Basic metabolic panel  . CBC with Differential/Platelet   Meds ordered this encounter  Medications  . clopidogrel (PLAVIX) 75 MG tablet    Sig: Take 1 tablet (75 mg total) by mouth daily for 30 days.    Dispense:  30 tablet    Refill:  0  . aspirin EC 81 MG tablet    Sig: Take 1 tablet (81 mg total) by mouth daily.    Dispense:  90 tablet    Refill:  3    Patient Instructions  Medication Instructions:  1) STOP COUMADIN 2) START PLAVIX 75 mg daily, but take 300 mg tomorrow for your first dose tomorrow 3) START ASPIRIN 81 mg daily  Testing/Procedures: Your physician has requested that you have a cardiac catheterization. Cardiac catheterization is used to diagnose and/or treat various heart conditions. Doctors may recommend this procedure for a number of different reasons. The most common reason is to evaluate chest pain. Chest pain can be a symptom of coronary artery disease (CAD), and cardiac catheterization can show whether plaque is narrowing or blocking your heart's arteries. This procedure is also used to evaluate the valves, as well as measure the blood flow and oxygen levels in different parts of your heart. For further information please visit [facility]uge[employer]iest[facility][email or web address]. Please follow instruction sheet, as given.   Any Other Special Instructions Will Be Listed Below (If Applicable). CATHETERIZATION INSTRUCTIONS:  You are scheduled for a Cardiac Catheterization on Monday, February 10 with Dr. Sherren Mocha.  1. Please arrive at the [place] (Main Entrance A) at [facility]: [address] [place], [place] [address] at 5:30  AM (This time is two hours before your procedure to ensure your preparation). Free valet parking service is available.   Special note: Every effort is made to have your procedure done on time. Please understand that emergencies sometimes delay scheduled procedures.  2. Diet: Do not eat solid foods after midnight.  The patient may have clear liquids until 5am upon the day of the procedure.  3. Labs: TODAY! CBC, BMET  4. Medication instructions in preparation for your procedure:  1) HOLD LASIX (your pee pill) the day before and morning of your catheterization  2) MAKE SURE TO TAKE YOUR PLAVIX AND ASPIRIN 81 mg the morning of your catherization  5. Plan for one night stay--bring personal belongings. 6. Bring a current list of your medications and current insurance cards. 7. You MUST have a responsible person to drive you home. 8. Someone MUST be with you the first 24 hours after you arrive home or your discharge will be delayed. 9. Please wear clothes that are easy to get on and off and wear slip-on shoes.  Thank you for allowing [place] to care for you!   -- [facility] Invasive Cardiovascular services     Signed, Sherren Mocha, MD  03/14/2018 1:38 PM  [facility]

## 2018-03-15 LAB — CBC WITH DIFFERENTIAL/PLATELET
BASOS: 1 %
Basophils Absolute: 0.1 10*3/uL (ref 0.0–0.2)
EOS (ABSOLUTE): 0.1 10*3/uL (ref 0.0–0.4)
Eos: 2 %
Hematocrit: 48.4 % (ref 37.5–51.0)
Hemoglobin: 15.8 g/dL (ref 13.0–17.7)
Immature Grans (Abs): 0.1 10*3/uL (ref 0.0–0.1)
Immature Granulocytes: 1 %
LYMPHS ABS: 1.6 10*3/uL (ref 0.7–3.1)
Lymphs: 18 %
MCH: 28.1 pg (ref 26.6–33.0)
MCHC: 32.6 g/dL (ref 31.5–35.7)
MCV: 86 fL (ref 79–97)
Monocytes Absolute: 1.1 10*3/uL — ABNORMAL HIGH (ref 0.1–0.9)
Monocytes: 13 %
Neutrophils Absolute: 5.8 10*3/uL (ref 1.4–7.0)
Neutrophils: 65 %
Platelets: 189 10*3/uL (ref 150–450)
RBC: 5.62 x10E6/uL (ref 4.14–5.80)
RDW: 16 % — ABNORMAL HIGH (ref 11.6–15.4)
WBC: 8.8 10*3/uL (ref 3.4–10.8)

## 2018-03-15 LAB — BASIC METABOLIC PANEL
BUN / CREAT RATIO: 19 (ref 10–24)
BUN: 29 mg/dL — ABNORMAL HIGH (ref 8–27)
CALCIUM: 10.1 mg/dL (ref 8.6–10.2)
CO2: 23 mmol/L (ref 20–29)
Chloride: 106 mmol/L (ref 96–106)
Creatinine, Ser: 1.51 mg/dL — ABNORMAL HIGH (ref 0.76–1.27)
GFR calc non Af Amer: 42 mL/min/{1.73_m2} — ABNORMAL LOW (ref >59)
GFR, EST AFRICAN AMERICAN: 48 mL/min/{1.73_m2} — AB (ref >59)
Glucose: 79 mg/dL (ref 65–99)
Potassium: 4.7 mmol/L (ref 3.5–5.2)
Sodium: 143 mmol/L (ref 134–144)

## 2018-03-18 ENCOUNTER — Inpatient Hospital Stay (HOSPITAL_COMMUNITY)
Admission: RE | Disposition: A | Discharge status: Home / Self Care | Payer: Self-pay | Source: Home / Self Care | Attending: Cardiovascular Disease

## 2018-03-18 ENCOUNTER — Inpatient Hospital Stay (HOSPITAL_COMMUNITY)
Admission: RE | Admit: 2018-03-18 | Discharge: 2018-03-22 | DRG: 266 | Disposition: A | Discharge status: Home / Self Care | Payer: Medicare Other | Attending: Cardiovascular Disease | Admitting: Cardiovascular Disease

## 2018-03-18 ENCOUNTER — Other Ambulatory Visit: Payer: Self-pay

## 2018-03-18 ENCOUNTER — Encounter (HOSPITAL_COMMUNITY): Payer: Self-pay | Admitting: Cardiovascular Disease

## 2018-03-18 ENCOUNTER — Inpatient Hospital Stay (HOSPITAL_COMMUNITY): Payer: Medicare Other

## 2018-03-18 DIAGNOSIS — K661 Hemoperitoneum: Secondary | ICD-10-CM | POA: Diagnosis not present

## 2018-03-18 DIAGNOSIS — Z86718 Personal history of other venous thrombosis and embolism: Secondary | ICD-10-CM | POA: Diagnosis not present

## 2018-03-18 DIAGNOSIS — Z808 Family history of malignant neoplasm of other organs or systems: Secondary | ICD-10-CM

## 2018-03-18 DIAGNOSIS — E782 Mixed hyperlipidemia: Secondary | ICD-10-CM | POA: Diagnosis present

## 2018-03-18 DIAGNOSIS — I25119 Atherosclerotic heart disease of native coronary artery with unspecified angina pectoris: Secondary | ICD-10-CM

## 2018-03-18 DIAGNOSIS — E869 Volume depletion, unspecified: Secondary | ICD-10-CM | POA: Diagnosis not present

## 2018-03-18 DIAGNOSIS — I4819 Other persistent atrial fibrillation: Secondary | ICD-10-CM | POA: Diagnosis not present

## 2018-03-18 DIAGNOSIS — Z950 Presence of cardiac pacemaker: Secondary | ICD-10-CM | POA: Diagnosis not present

## 2018-03-18 DIAGNOSIS — Z952 Presence of prosthetic heart valve: Secondary | ICD-10-CM

## 2018-03-18 DIAGNOSIS — Z01818 Encounter for other preprocedural examination: Secondary | ICD-10-CM | POA: Diagnosis not present

## 2018-03-18 DIAGNOSIS — Z86711 Personal history of pulmonary embolism: Secondary | ICD-10-CM

## 2018-03-18 DIAGNOSIS — N183 Chronic kidney disease, stage 3 unspecified: Secondary | ICD-10-CM | POA: Diagnosis present

## 2018-03-18 DIAGNOSIS — Z95828 Presence of other vascular implants and grafts: Secondary | ICD-10-CM

## 2018-03-18 DIAGNOSIS — Z954 Presence of other heart-valve replacement: Secondary | ICD-10-CM | POA: Diagnosis not present

## 2018-03-18 DIAGNOSIS — D62 Acute posthemorrhagic anemia: Secondary | ICD-10-CM | POA: Diagnosis not present

## 2018-03-18 DIAGNOSIS — J9811 Atelectasis: Secondary | ICD-10-CM | POA: Diagnosis not present

## 2018-03-18 DIAGNOSIS — I5042 Chronic combined systolic (congestive) and diastolic (congestive) heart failure: Secondary | ICD-10-CM | POA: Diagnosis not present

## 2018-03-18 DIAGNOSIS — Z87442 Personal history of urinary calculi: Secondary | ICD-10-CM

## 2018-03-18 DIAGNOSIS — Z8546 Personal history of malignant neoplasm of prostate: Secondary | ICD-10-CM | POA: Diagnosis not present

## 2018-03-18 DIAGNOSIS — G4733 Obstructive sleep apnea (adult) (pediatric): Secondary | ICD-10-CM | POA: Diagnosis present

## 2018-03-18 DIAGNOSIS — I959 Hypotension, unspecified: Secondary | ICD-10-CM | POA: Diagnosis not present

## 2018-03-18 DIAGNOSIS — I350 Nonrheumatic aortic (valve) stenosis: Principal | ICD-10-CM

## 2018-03-18 DIAGNOSIS — Z01811 Encounter for preprocedural respiratory examination: Secondary | ICD-10-CM | POA: Diagnosis not present

## 2018-03-18 DIAGNOSIS — I4430 Unspecified atrioventricular block: Secondary | ICD-10-CM | POA: Diagnosis present

## 2018-03-18 DIAGNOSIS — I110 Hypertensive heart disease with heart failure: Secondary | ICD-10-CM | POA: Diagnosis not present

## 2018-03-18 DIAGNOSIS — I517 Cardiomegaly: Secondary | ICD-10-CM | POA: Diagnosis not present

## 2018-03-18 DIAGNOSIS — Z006 Encounter for examination for normal comparison and control in clinical research program: Secondary | ICD-10-CM | POA: Diagnosis not present

## 2018-03-18 DIAGNOSIS — Z9079 Acquired absence of other genital organ(s): Secondary | ICD-10-CM | POA: Diagnosis not present

## 2018-03-18 DIAGNOSIS — N200 Calculus of kidney: Secondary | ICD-10-CM | POA: Diagnosis not present

## 2018-03-18 DIAGNOSIS — I340 Nonrheumatic mitral (valve) insufficiency: Secondary | ICD-10-CM | POA: Diagnosis not present

## 2018-03-18 DIAGNOSIS — Z7901 Long term (current) use of anticoagulants: Secondary | ICD-10-CM | POA: Diagnosis not present

## 2018-03-18 DIAGNOSIS — Z87891 Personal history of nicotine dependence: Secondary | ICD-10-CM | POA: Diagnosis not present

## 2018-03-18 DIAGNOSIS — N189 Chronic kidney disease, unspecified: Secondary | ICD-10-CM | POA: Diagnosis present

## 2018-03-18 DIAGNOSIS — I130 Hypertensive heart and chronic kidney disease with heart failure and stage 1 through stage 4 chronic kidney disease, or unspecified chronic kidney disease: Secondary | ICD-10-CM | POA: Diagnosis not present

## 2018-03-18 DIAGNOSIS — I252 Old myocardial infarction: Secondary | ICD-10-CM

## 2018-03-18 DIAGNOSIS — N179 Acute kidney failure, unspecified: Secondary | ICD-10-CM | POA: Diagnosis not present

## 2018-03-18 DIAGNOSIS — I2510 Atherosclerotic heart disease of native coronary artery without angina pectoris: Secondary | ICD-10-CM | POA: Diagnosis present

## 2018-03-18 DIAGNOSIS — Z955 Presence of coronary angioplasty implant and graft: Secondary | ICD-10-CM

## 2018-03-18 DIAGNOSIS — I5032 Chronic diastolic (congestive) heart failure: Secondary | ICD-10-CM | POA: Diagnosis not present

## 2018-03-18 DIAGNOSIS — Z96653 Presence of artificial knee joint, bilateral: Secondary | ICD-10-CM | POA: Diagnosis present

## 2018-03-18 HISTORY — DX: Nonrheumatic aortic (valve) stenosis: I35.0

## 2018-03-18 HISTORY — DX: Other persistent atrial fibrillation: I48.19

## 2018-03-18 HISTORY — PX: CORONARY STENT INTERVENTION: CATH118234

## 2018-03-18 LAB — BLOOD GAS, ARTERIAL
Acid-base deficit: 4.2 mmol/L — ABNORMAL HIGH (ref 0.0–2.0)
Bicarbonate: 19.3 mmol/L — ABNORMAL LOW (ref 20.0–28.0)
Drawn by: 236041
FIO2: 21
O2 SAT: 92.1 %
PO2 ART: 59.8 mmHg — AB (ref 83.0–108.0)
Patient temperature: 97.6
pCO2 arterial: 28.6 mmHg — ABNORMAL LOW (ref 32.0–48.0)
pH, Arterial: 7.442 (ref 7.350–7.450)

## 2018-03-18 LAB — HEMOGLOBIN A1C
Hgb A1c MFr Bld: 6.3 % — ABNORMAL HIGH (ref 4.8–5.6)
MEAN PLASMA GLUCOSE: 134.11 mg/dL

## 2018-03-18 LAB — URINALYSIS, COMPLETE (UACMP) WITH MICROSCOPIC
Bilirubin Urine: NEGATIVE
Glucose, UA: NEGATIVE mg/dL
Hgb urine dipstick: NEGATIVE
Ketones, ur: NEGATIVE mg/dL
Nitrite: NEGATIVE
Protein, ur: NEGATIVE mg/dL
Specific Gravity, Urine: 1.02 (ref 1.005–1.030)
WBC, UA: >50 WBC/hpf — ABNORMAL HIGH (ref 0–5)
pH: 6 (ref 5.0–8.0)

## 2018-03-18 LAB — TYPE AND SCREEN
ABO/RH(D): A POS
Antibody Screen: NEGATIVE

## 2018-03-18 LAB — SURGICAL PCR SCREEN
MRSA, PCR: NEGATIVE
Staphylococcus aureus: NEGATIVE

## 2018-03-18 LAB — POCT ACTIVATED CLOTTING TIME
Activated Clotting Time: 307 seconds
Activated Clotting Time: 263 seconds

## 2018-03-18 LAB — APTT: aPTT: 35 seconds (ref 24–36)

## 2018-03-18 LAB — PROTIME-INR
INR: 1.3
Prothrombin Time: 16.1 seconds — ABNORMAL HIGH (ref 11.4–15.2)

## 2018-03-18 SURGERY — CORONARY STENT INTERVENTION
Anesthesia: LOCAL

## 2018-03-18 MED ORDER — NITROGLYCERIN 1 MG/10 ML FOR IR/CATH LAB
INTRA_ARTERIAL | Status: AC
  Filled 2018-03-18: qty 10

## 2018-03-18 MED ORDER — HEPARIN (PORCINE) IN NACL 1000-0.9 UT/500ML-% IV SOLN
INTRAVENOUS | Status: AC
  Filled 2018-03-18: qty 500

## 2018-03-18 MED ORDER — CHLORHEXIDINE GLUCONATE 4 % EX LIQD
1.0000 "application " | Freq: Once | CUTANEOUS | Status: AC
  Administered 2018-03-18 – 2018-03-19 (×2): 1 via TOPICAL
  Filled 2018-03-18: qty 60

## 2018-03-18 MED ORDER — ASPIRIN EC 81 MG PO TBEC
81.0000 mg | DELAYED_RELEASE_TABLET | Freq: Every day | ORAL | Status: DC
  Administered 2018-03-20 – 2018-03-22 (×3): 81 mg via ORAL
  Filled 2018-03-18 (×3): qty 1

## 2018-03-18 MED ORDER — FENOFIBRATE 160 MG PO TABS
160.0000 mg | ORAL_TABLET | Freq: Every day | ORAL | Status: DC
  Administered 2018-03-18 – 2018-03-22 (×4): 160 mg via ORAL
  Filled 2018-03-18 (×4): qty 1

## 2018-03-18 MED ORDER — HYPROMELLOSE (GONIOSCOPIC) 2.5 % OP SOLN
1.0000 [drp] | Freq: Three times a day (TID) | OPHTHALMIC | Status: DC | PRN
  Filled 2018-03-18: qty 15

## 2018-03-18 MED ORDER — HEPARIN SODIUM (PORCINE) 1000 UNIT/ML IJ SOLN
INTRAMUSCULAR | Status: DC | PRN
  Administered 2018-03-18: 10000 [IU] via INTRAVENOUS

## 2018-03-18 MED ORDER — ONDANSETRON HCL 4 MG/2ML IJ SOLN: 4.0000 mg | Freq: Four times a day (QID) | INTRAMUSCULAR | Status: DC | PRN

## 2018-03-18 MED ORDER — SODIUM CHLORIDE 0.9% FLUSH: 3.0000 mL | Freq: Two times a day (BID) | INTRAVENOUS | Status: DC

## 2018-03-18 MED ORDER — SODIUM CHLORIDE 0.9 % WEIGHT BASED INFUSION
3.0000 mL/kg/h | INTRAVENOUS | Status: DC
  Administered 2018-03-18: 3 mL/kg/h via INTRAVENOUS

## 2018-03-18 MED ORDER — MIDAZOLAM HCL 2 MG/2ML IJ SOLN
INTRAMUSCULAR | Status: AC
  Filled 2018-03-18: qty 2

## 2018-03-18 MED ORDER — NITROGLYCERIN 1 MG/10 ML FOR IR/CATH LAB
INTRA_ARTERIAL | Status: DC | PRN
  Administered 2018-03-18 (×3): 100 ug via INTRACORONARY

## 2018-03-18 MED ORDER — DIPHENHYDRAMINE HCL 25 MG PO CAPS
50.0000 mg | ORAL_CAPSULE | Freq: Every evening | ORAL | Status: DC | PRN
  Administered 2018-03-19 – 2018-03-21 (×3): 50 mg via ORAL
  Filled 2018-03-18 (×3): qty 2

## 2018-03-18 MED ORDER — NOREPINEPHRINE BITARTRATE 1 MG/ML IV SOLN
0.0000 ug/min | INTRAVENOUS | Status: AC
  Administered 2018-03-19: 1 ug/min via INTRAVENOUS
  Filled 2018-03-18: qty 4

## 2018-03-18 MED ORDER — PANTOPRAZOLE SODIUM 40 MG PO TBEC
40.0000 mg | DELAYED_RELEASE_TABLET | Freq: Every day | ORAL | Status: DC
  Administered 2018-03-20 – 2018-03-22 (×3): 40 mg via ORAL
  Filled 2018-03-18 (×3): qty 1

## 2018-03-18 MED ORDER — DOPAMINE-DEXTROSE 3.2-5 MG/ML-% IV SOLN
0.0000 ug/kg/min | INTRAVENOUS | Status: DC
  Filled 2018-03-18: qty 250

## 2018-03-18 MED ORDER — DOCUSATE SODIUM 100 MG PO CAPS
100.0000 mg | ORAL_CAPSULE | Freq: Every day | ORAL | Status: DC
  Administered 2018-03-20 – 2018-03-22 (×3): 100 mg via ORAL
  Filled 2018-03-18 (×3): qty 1

## 2018-03-18 MED ORDER — HEPARIN (PORCINE) IN NACL 1000-0.9 UT/500ML-% IV SOLN
INTRAVENOUS | Status: DC | PRN
  Administered 2018-03-18 (×3): 500 mL

## 2018-03-18 MED ORDER — VERAPAMIL HCL 2.5 MG/ML IV SOLN
INTRAVENOUS | Status: AC
  Filled 2018-03-18: qty 2

## 2018-03-18 MED ORDER — HYDRALAZINE HCL 20 MG/ML IJ SOLN: 5.0000 mg | INTRAMUSCULAR | Status: AC | PRN

## 2018-03-18 MED ORDER — PRAVASTATIN SODIUM 10 MG PO TABS
20.0000 mg | ORAL_TABLET | Freq: Every day | ORAL | Status: DC
  Administered 2018-03-18 – 2018-03-21 (×4): 20 mg via ORAL
  Filled 2018-03-18 (×4): qty 2

## 2018-03-18 MED ORDER — AMLODIPINE BESYLATE 5 MG PO TABS: 2.5000 mg | ORAL_TABLET | Freq: Every evening | ORAL | Status: DC

## 2018-03-18 MED ORDER — CHLORHEXIDINE GLUCONATE 0.12 % MT SOLN
15.0000 mL | Freq: Once | OROMUCOSAL | Status: AC
  Administered 2018-03-19: 15 mL via OROMUCOSAL
  Filled 2018-03-18: qty 15

## 2018-03-18 MED ORDER — TEMAZEPAM 15 MG PO CAPS
15.0000 mg | ORAL_CAPSULE | Freq: Once | ORAL | Status: AC | PRN
  Administered 2018-03-19: 01:00:00 15 mg via ORAL
  Filled 2018-03-18: qty 1

## 2018-03-18 MED ORDER — VANCOMYCIN HCL 10 G IV SOLR
1500.0000 mg | INTRAVENOUS | Status: AC
  Administered 2018-03-19: 1500 mg via INTRAVENOUS
  Filled 2018-03-18: qty 1500

## 2018-03-18 MED ORDER — VERAPAMIL HCL 2.5 MG/ML IV SOLN
INTRAVENOUS | Status: DC | PRN
  Administered 2018-03-18: 10 mL via INTRA_ARTERIAL

## 2018-03-18 MED ORDER — MAGNESIUM SULFATE 50 % IJ SOLN
40.0000 meq | INTRAMUSCULAR | Status: DC
  Filled 2018-03-18: qty 9.85

## 2018-03-18 MED ORDER — SODIUM CHLORIDE 0.9 % IV SOLN
INTRAVENOUS | Status: DC
  Filled 2018-03-18: qty 30

## 2018-03-18 MED ORDER — HEPARIN SODIUM (PORCINE) 1000 UNIT/ML IJ SOLN
INTRAMUSCULAR | Status: AC
  Filled 2018-03-18: qty 1

## 2018-03-18 MED ORDER — HEPARIN (PORCINE) IN NACL 1000-0.9 UT/500ML-% IV SOLN
INTRAVENOUS | Status: AC
  Filled 2018-03-18: qty 1000

## 2018-03-18 MED ORDER — NITROGLYCERIN IN D5W 200-5 MCG/ML-% IV SOLN
2.0000 ug/min | INTRAVENOUS | Status: DC
  Filled 2018-03-18: qty 250

## 2018-03-18 MED ORDER — FENTANYL CITRATE (PF) 100 MCG/2ML IJ SOLN
INTRAMUSCULAR | Status: AC
  Filled 2018-03-18: qty 2

## 2018-03-18 MED ORDER — ASPIRIN 81 MG PO CHEW: 81.0000 mg | CHEWABLE_TABLET | ORAL | Status: DC

## 2018-03-18 MED ORDER — SODIUM CHLORIDE 0.9% FLUSH
3.0000 mL | Freq: Two times a day (BID) | INTRAVENOUS | Status: DC
  Administered 2018-03-18: 23:00:00 3 mL via INTRAVENOUS

## 2018-03-18 MED ORDER — SODIUM CHLORIDE 0.9 % WEIGHT BASED INFUSION
1.0000 mL/kg/h | INTRAVENOUS | Status: DC
  Administered 2018-03-18: 1 mL/kg/h via INTRAVENOUS

## 2018-03-18 MED ORDER — INSULIN REGULAR(HUMAN) IN NACL 100-0.9 UT/100ML-% IV SOLN
INTRAVENOUS | Status: DC
  Filled 2018-03-18: qty 100

## 2018-03-18 MED ORDER — ACETAMINOPHEN 325 MG PO TABS: 650.0000 mg | ORAL_TABLET | ORAL | Status: DC | PRN

## 2018-03-18 MED ORDER — IOHEXOL 350 MG/ML SOLN
INTRAVENOUS | Status: DC | PRN
  Administered 2018-03-18: 80 mL via INTRA_ARTERIAL

## 2018-03-18 MED ORDER — SODIUM CHLORIDE 0.9 % WEIGHT BASED INFUSION
1.0000 mL/kg/h | INTRAVENOUS | Status: AC
  Administered 2018-03-18: 1 mL/kg/h via INTRAVENOUS

## 2018-03-18 MED ORDER — FENTANYL CITRATE (PF) 100 MCG/2ML IJ SOLN
INTRAMUSCULAR | Status: DC | PRN
  Administered 2018-03-18 (×2): 25 ug via INTRAVENOUS

## 2018-03-18 MED ORDER — EPINEPHRINE PF 1 MG/ML IJ SOLN
0.0000 ug/min | INTRAVENOUS | Status: DC
  Filled 2018-03-18: qty 4

## 2018-03-18 MED ORDER — SODIUM CHLORIDE 0.9 % IV SOLN
1.5000 g | INTRAVENOUS | Status: AC
  Administered 2018-03-19: 1.5 g via INTRAVENOUS
  Filled 2018-03-18: qty 1.5

## 2018-03-18 MED ORDER — CLOPIDOGREL BISULFATE 75 MG PO TABS: 75.0000 mg | ORAL_TABLET | ORAL | Status: DC

## 2018-03-18 MED ORDER — PHENYLEPHRINE HCL-NACL 20-0.9 MG/250ML-% IV SOLN
30.0000 ug/min | INTRAVENOUS | Status: DC
  Filled 2018-03-18: qty 250

## 2018-03-18 MED ORDER — SODIUM CHLORIDE 0.9 % IV SOLN: 250.0000 mL | INTRAVENOUS | Status: DC | PRN

## 2018-03-18 MED ORDER — LIDOCAINE HCL (PF) 1 % IJ SOLN
INTRAMUSCULAR | Status: DC | PRN
  Administered 2018-03-18: 2 mL

## 2018-03-18 MED ORDER — MIDAZOLAM HCL 2 MG/2ML IJ SOLN
INTRAMUSCULAR | Status: DC | PRN
  Administered 2018-03-18 (×2): 1 mg via INTRAVENOUS

## 2018-03-18 MED ORDER — LIDOCAINE HCL (PF) 1 % IJ SOLN
INTRAMUSCULAR | Status: AC
  Filled 2018-03-18: qty 30

## 2018-03-18 MED ORDER — DEXMEDETOMIDINE HCL IN NACL 400 MCG/100ML IV SOLN
0.1000 ug/kg/h | INTRAVENOUS | Status: AC
  Administered 2018-03-19: 1 ug/kg/h via INTRAVENOUS
  Filled 2018-03-18: qty 100

## 2018-03-18 MED ORDER — ANGIOPLASTY BOOK
Freq: Once | Status: AC
  Administered 2018-03-18: 23:00:00
  Filled 2018-03-18: qty 1

## 2018-03-18 MED ORDER — CLOPIDOGREL BISULFATE 75 MG PO TABS
75.0000 mg | ORAL_TABLET | Freq: Every day | ORAL | Status: DC
  Administered 2018-03-20 – 2018-03-22 (×3): 75 mg via ORAL
  Filled 2018-03-18 (×3): qty 1

## 2018-03-18 MED ORDER — SODIUM CHLORIDE 0.9% FLUSH: 3.0000 mL | INTRAVENOUS | Status: DC | PRN

## 2018-03-18 MED ORDER — LABETALOL HCL 5 MG/ML IV SOLN: 10.0000 mg | INTRAVENOUS | Status: AC | PRN

## 2018-03-18 MED ORDER — BISACODYL 5 MG PO TBEC
5.0000 mg | DELAYED_RELEASE_TABLET | Freq: Once | ORAL | Status: DC
  Filled 2018-03-18: qty 1

## 2018-03-18 MED ORDER — DIPHENHYDRAMINE HCL (SLEEP) 50 MG PO CAPS: 50.0000 mg | ORAL_CAPSULE | Freq: Every day | ORAL | Status: DC

## 2018-03-18 MED ORDER — POTASSIUM CHLORIDE 2 MEQ/ML IV SOLN
80.0000 meq | INTRAVENOUS | Status: DC
  Filled 2018-03-18: qty 40

## 2018-03-18 SURGICAL SUPPLY — 18 items
BALLN SAPPHIRE 2.5X15 (BALLOONS) ×2
BALLN ~~LOC~~ EMERGE MR 3.0X20 (BALLOONS) ×2
BALLOON SAPPHIRE 2.5X15 (BALLOONS) IMPLANT
BALLOON ~~LOC~~ EMERGE MR 3.0X20 (BALLOONS) IMPLANT
CATH LAUNCHER 6FR JR4 (CATHETERS) ×1 IMPLANT
DEVICE RAD COMP TR BAND LRG (VASCULAR PRODUCTS) ×1 IMPLANT
ELECT DEFIB PAD ADLT CADENCE (PAD) ×1 IMPLANT
GLIDESHEATH SLEND SS 6F .021 (SHEATH) ×1 IMPLANT
GUIDEWIRE INQWIRE 1.5J.035X260 (WIRE) IMPLANT
INQWIRE 1.5J .035X260CM (WIRE) ×2
KIT ENCORE 26 ADVANTAGE (KITS) ×1 IMPLANT
KIT HEART LEFT (KITS) ×2 IMPLANT
PACK CARDIAC CATHETERIZATION (CUSTOM PROCEDURE TRAY) ×2 IMPLANT
STENT SYNERGY DES 2.5X32 (Permanent Stent) ×1 IMPLANT
STENT SYNERGY DES 3X24 (Permanent Stent) ×1 IMPLANT
TRANSDUCER W/STOPCOCK (MISCELLANEOUS) ×2 IMPLANT
TUBING CIL FLEX 10 FLL-RA (TUBING) ×2 IMPLANT
WIRE COUGAR XT STRL 190CM (WIRE) ×1 IMPLANT

## 2018-03-18 NOTE — Interval H&P Note (Signed)
Cath Lab Visit (complete for each Cath Lab visit)  Clinical Evaluation Leading to the Procedure:   ACS: No.  Non-ACS:    Anginal Classification: CCS II  Anti-ischemic medical therapy: Minimal Therapy (1 class of medications)  Non-Invasive Test Results: No non-invasive testing performed  Prior CABG: No previous CABG      History and Physical Interval Note:  03/18/2018 11:53 AM  [name]  has presented today for surgery, with the diagnosis of CAD  The various methods of treatment have been discussed with the patient and family. After consideration of risks, benefits and other options for treatment, the patient has consented to  Procedure(s): CORONARY STENT INTERVENTION (N/A) as a surgical intervention .  The patient's history has been reviewed, patient examined, no change in status, stable for surgery.  I have reviewed the patient's chart and labs.  Questions were answered to the patient's satisfaction.     Sherren Mocha

## 2018-03-18 NOTE — Anesthesia Preprocedure Evaluation (Addendum)
Anesthesia Evaluation  Patient identified by MRN, date of birth, ID band Patient awake    Reviewed: Allergy & Precautions, H&P , NPO status , Patient's Chart, lab work & pertinent test results  Airway Mallampati: II  TM Distance: >3 FB Neck ROM: Full    Dental no notable dental hx. (+) Teeth Intact, Dental Advisory Given   Pulmonary asthma , sleep apnea and Continuous Positive Airway Pressure Ventilation , former smoker,    Pulmonary exam normal breath sounds clear to auscultation       Cardiovascular Exercise Tolerance: Good hypertension, Pt. on medications + CAD and + Past MI  + dysrhythmias Atrial Fibrillation + Valvular Problems/Murmurs AS  Rhythm:Regular Rate:Normal + Systolic murmurs    Neuro/Psych negative neurological ROS  negative psych ROS   GI/Hepatic Neg liver ROS, GERD  Medicated and Controlled,  Endo/Other  negative endocrine ROS  Renal/GU Renal InsufficiencyRenal disease  negative genitourinary   Musculoskeletal  (+) Arthritis ,   Abdominal   Peds  Hematology negative hematology ROS (+)   Anesthesia Other Findings   Reproductive/Obstetrics negative OB ROS                            Anesthesia Physical Anesthesia Plan  ASA: IV  Anesthesia Plan: MAC   Post-op Pain Management:    Induction: Intravenous  PONV Risk Score and Plan: 2 and Propofol infusion and Ondansetron  Airway Management Planned: Simple Face Mask  Additional Equipment: Arterial line, CVP and Ultrasound Guidance Line Placement  Intra-op Plan:   Post-operative Plan:   Informed Consent: I have reviewed the patients History and Physical, chart, labs and discussed the procedure including the risks, benefits and alternatives for the proposed anesthesia with the patient or authorized representative who has indicated his/her understanding and acceptance.     Dental advisory given  Plan Discussed with:  CRNA  Anesthesia Plan Comments:         Anesthesia Quick Evaluation

## 2018-03-18 NOTE — Progress Notes (Signed)
Patient refuses CPAP use for the night.  Patient states that he does wear CPAP at home when sleeping in bed but does not need when he is in a reclined position.  Patient also does not want to pay for CPAP.  He has HOB up and is using 2L nasal cannula.  Patient advised that he can change his mind about CPAP usage at any time.  RN aware.

## 2018-03-19 ENCOUNTER — Inpatient Hospital Stay (HOSPITAL_COMMUNITY): Payer: Medicare Other

## 2018-03-19 ENCOUNTER — Telehealth: Payer: Self-pay | Admitting: Cardiovascular Disease

## 2018-03-19 ENCOUNTER — Inpatient Hospital Stay (HOSPITAL_COMMUNITY): Payer: Medicare Other | Admitting: Anesthesiology

## 2018-03-19 ENCOUNTER — Encounter (HOSPITAL_COMMUNITY)
Admission: RE | Disposition: A | Discharge status: Home / Self Care | Payer: Self-pay | Source: Home / Self Care | Attending: Cardiovascular Disease

## 2018-03-19 ENCOUNTER — Encounter (HOSPITAL_COMMUNITY): Payer: Self-pay | Admitting: Orthopedic Surgery

## 2018-03-19 DIAGNOSIS — N183 Chronic kidney disease, stage 3 unspecified: Secondary | ICD-10-CM | POA: Diagnosis present

## 2018-03-19 DIAGNOSIS — I350 Nonrheumatic aortic (valve) stenosis: Secondary | ICD-10-CM

## 2018-03-19 DIAGNOSIS — I340 Nonrheumatic mitral (valve) insufficiency: Secondary | ICD-10-CM

## 2018-03-19 DIAGNOSIS — Z952 Presence of prosthetic heart valve: Secondary | ICD-10-CM

## 2018-03-19 DIAGNOSIS — Z006 Encounter for examination for normal comparison and control in clinical research program: Secondary | ICD-10-CM

## 2018-03-19 HISTORY — PX: TRANSCATHETER AORTIC VALVE REPLACEMENT, TRANSFEMORAL: SHX6400

## 2018-03-19 HISTORY — PX: INTRAOPERATIVE TRANSTHORACIC ECHOCARDIOGRAM: SHX6523

## 2018-03-19 LAB — BASIC METABOLIC PANEL
Anion gap: 10 (ref 5–15)
BUN: 23 mg/dL (ref 8–23)
CHLORIDE: 111 mmol/L (ref 98–111)
CO2: 18 mmol/L — ABNORMAL LOW (ref 22–32)
Calcium: 9.3 mg/dL (ref 8.9–10.3)
Creatinine, Ser: 1.47 mg/dL — ABNORMAL HIGH (ref 0.61–1.24)
GFR calc Af Amer: 50 mL/min — ABNORMAL LOW (ref >60)
GFR calc non Af Amer: 43 mL/min — ABNORMAL LOW (ref >60)
GLUCOSE: 109 mg/dL — AB (ref 70–99)
Potassium: 4.4 mmol/L (ref 3.5–5.1)
Sodium: 139 mmol/L (ref 135–145)

## 2018-03-19 LAB — CBC
HCT: 44.5 % (ref 39.0–52.0)
Hemoglobin: 14.8 g/dL (ref 13.0–17.0)
MCH: 28 pg (ref 26.0–34.0)
MCHC: 33.3 g/dL (ref 30.0–36.0)
MCV: 84.1 fL (ref 80.0–100.0)
Platelets: 173 10*3/uL (ref 150–400)
RBC: 5.29 MIL/uL (ref 4.22–5.81)
RDW: 16.3 % — ABNORMAL HIGH (ref 11.5–15.5)
WBC: 6.5 10*3/uL (ref 4.0–10.5)
nRBC: 0 % (ref 0.0–0.2)

## 2018-03-19 LAB — POCT I-STAT 4, (NA,K, GLUC, HGB,HCT)
Glucose, Bld: 99 mg/dL (ref 70–99)
Glucose, Bld: 124 mg/dL — ABNORMAL HIGH (ref 70–99)
Glucose, Bld: 123 mg/dL — ABNORMAL HIGH (ref 70–99)
Glucose, Bld: 124 mg/dL — ABNORMAL HIGH (ref 70–99)
HCT: 43 % (ref 39.0–52.0)
HCT: 43 % (ref 39.0–52.0)
HCT: 39 % (ref 39.0–52.0)
HCT: 41 % (ref 39.0–52.0)
HEMOGLOBIN: 14.6 g/dL (ref 13.0–17.0)
Hemoglobin: 14.6 g/dL (ref 13.0–17.0)
Hemoglobin: 13.3 g/dL (ref 13.0–17.0)
Hemoglobin: 13.9 g/dL (ref 13.0–17.0)
Potassium: 4.2 mmol/L (ref 3.5–5.1)
Potassium: 4 mmol/L (ref 3.5–5.1)
Potassium: 4 mmol/L (ref 3.5–5.1)
Potassium: 4.2 mmol/L (ref 3.5–5.1)
SODIUM: 142 mmol/L (ref 135–145)
SODIUM: 141 mmol/L (ref 135–145)
Sodium: 141 mmol/L (ref 135–145)
Sodium: 142 mmol/L (ref 135–145)

## 2018-03-19 LAB — ABO/RH: ABO/RH(D): A POS

## 2018-03-19 SURGERY — IMPLANTATION, AORTIC VALVE, TRANSCATHETER, FEMORAL APPROACH
Anesthesia: Monitor Anesthesia Care | Site: Chest

## 2018-03-19 MED ORDER — SODIUM CHLORIDE 0.9 % IV SOLN
1.5000 g | Freq: Two times a day (BID) | INTRAVENOUS | Status: AC
  Administered 2018-03-19 – 2018-03-21 (×4): 1.5 g via INTRAVENOUS
  Filled 2018-03-19 (×5): qty 1.5

## 2018-03-19 MED ORDER — PROTAMINE SULFATE 10 MG/ML IV SOLN
INTRAVENOUS | Status: AC
  Filled 2018-03-19: qty 10

## 2018-03-19 MED ORDER — ONDANSETRON HCL 4 MG/2ML IJ SOLN: 4.0000 mg | Freq: Four times a day (QID) | INTRAMUSCULAR | Status: DC | PRN

## 2018-03-19 MED ORDER — ESMOLOL HCL 100 MG/10ML IV SOLN
INTRAVENOUS | Status: AC
  Filled 2018-03-19: qty 10

## 2018-03-19 MED ORDER — SODIUM CHLORIDE 0.9 % IV SOLN
INTRAVENOUS | Status: AC
  Administered 2018-03-19: 16:00:00 via INTRAVENOUS

## 2018-03-19 MED ORDER — DEXMEDETOMIDINE HCL IN NACL 200 MCG/50ML IV SOLN
INTRAVENOUS | Status: DC | PRN
  Administered 2018-03-19: 106.52 ug via INTRAVENOUS

## 2018-03-19 MED ORDER — FENTANYL CITRATE (PF) 100 MCG/2ML IJ SOLN
INTRAMUSCULAR | Status: AC
  Administered 2018-03-19: 50 ug via INTRAVENOUS
  Filled 2018-03-19: qty 2

## 2018-03-19 MED ORDER — ACETAMINOPHEN 500 MG PO TABS
ORAL_TABLET | ORAL | Status: AC
  Administered 2018-03-19: 1000 mg via ORAL
  Filled 2018-03-19: qty 2

## 2018-03-19 MED ORDER — TRAMADOL HCL 50 MG PO TABS: 50.0000 mg | ORAL_TABLET | ORAL | Status: DC | PRN

## 2018-03-19 MED ORDER — CLEVIDIPINE BUTYRATE 0.5 MG/ML IV EMUL
INTRAVENOUS | Status: DC | PRN
  Administered 2018-03-19: 2 mg/h via INTRAVENOUS

## 2018-03-19 MED ORDER — NITROGLYCERIN IN D5W 200-5 MCG/ML-% IV SOLN
0.0000 ug/min | INTRAVENOUS | Status: DC
  Filled 2018-03-19: qty 250

## 2018-03-19 MED ORDER — ACETAMINOPHEN 500 MG PO TABS
1000.0000 mg | ORAL_TABLET | Freq: Once | ORAL | Status: AC
  Administered 2018-03-19: 1000 mg via ORAL

## 2018-03-19 MED ORDER — LACTATED RINGERS IV SOLN
INTRAVENOUS | Status: DC | PRN
  Administered 2018-03-19: 11:00:00 via INTRAVENOUS

## 2018-03-19 MED ORDER — VANCOMYCIN HCL IN DEXTROSE 1-5 GM/200ML-% IV SOLN
1000.0000 mg | Freq: Once | INTRAVENOUS | Status: AC
  Administered 2018-03-19: 1000 mg via INTRAVENOUS
  Filled 2018-03-19: qty 200

## 2018-03-19 MED ORDER — CHLORHEXIDINE GLUCONATE 4 % EX LIQD
CUTANEOUS | Status: AC
  Administered 2018-03-19: 1 via TOPICAL
  Filled 2018-03-19: qty 60

## 2018-03-19 MED ORDER — SODIUM CHLORIDE 0.9 % IV SOLN
250.0000 mL | INTRAVENOUS | Status: DC | PRN
  Administered 2018-03-19: 250 mL via INTRAVENOUS

## 2018-03-19 MED ORDER — SODIUM CHLORIDE 0.9 % IV SOLN
INTRAVENOUS | Status: AC
  Filled 2018-03-19 (×3): qty 1.2

## 2018-03-19 MED ORDER — FENTANYL CITRATE (PF) 100 MCG/2ML IJ SOLN
50.0000 ug | Freq: Once | INTRAMUSCULAR | Status: AC
  Administered 2018-03-19: 50 ug via INTRAVENOUS

## 2018-03-19 MED ORDER — ACETAMINOPHEN 325 MG PO TABS: 650.0000 mg | ORAL_TABLET | Freq: Four times a day (QID) | ORAL | Status: DC | PRN

## 2018-03-19 MED ORDER — MORPHINE SULFATE (PF) 2 MG/ML IV SOLN
1.0000 mg | INTRAVENOUS | Status: DC | PRN
  Administered 2018-03-19: 2 mg via INTRAVENOUS
  Filled 2018-03-19: qty 1

## 2018-03-19 MED ORDER — LIDOCAINE HCL (PF) 1 % IJ SOLN
INTRAMUSCULAR | Status: DC | PRN
  Administered 2018-03-19: 5.5 mL

## 2018-03-19 MED ORDER — ACETAMINOPHEN 650 MG RE SUPP: 650.0000 mg | Freq: Four times a day (QID) | RECTAL | Status: DC | PRN

## 2018-03-19 MED ORDER — SODIUM CHLORIDE 0.9% FLUSH: 3.0000 mL | INTRAVENOUS | Status: DC | PRN

## 2018-03-19 MED ORDER — LACTATED RINGERS IV SOLN
INTRAVENOUS | Status: DC
  Administered 2018-03-19: 11:00:00 via INTRAVENOUS

## 2018-03-19 MED ORDER — PROPOFOL 500 MG/50ML IV EMUL
INTRAVENOUS | Status: DC | PRN
  Administered 2018-03-19: 10 ug/kg/min via INTRAVENOUS

## 2018-03-19 MED ORDER — SODIUM CHLORIDE 0.9% FLUSH
3.0000 mL | Freq: Two times a day (BID) | INTRAVENOUS | Status: DC
  Administered 2018-03-19 – 2018-03-22 (×6): 3 mL via INTRAVENOUS

## 2018-03-19 MED ORDER — METOPROLOL TARTRATE 5 MG/5ML IV SOLN: 2.5000 mg | INTRAVENOUS | Status: DC | PRN

## 2018-03-19 MED ORDER — PROPOFOL 10 MG/ML IV BOLUS
INTRAVENOUS | Status: DC | PRN
  Administered 2018-03-19: 10 mg via INTRAVENOUS

## 2018-03-19 MED ORDER — ONDANSETRON HCL 4 MG/2ML IJ SOLN
INTRAMUSCULAR | Status: AC
  Filled 2018-03-19: qty 2

## 2018-03-19 MED ORDER — OXYCODONE HCL 5 MG PO TABS
5.0000 mg | ORAL_TABLET | ORAL | Status: DC | PRN
  Administered 2018-03-19 – 2018-03-20 (×2): 10 mg via ORAL
  Filled 2018-03-19 (×2): qty 2

## 2018-03-19 MED ORDER — MIDAZOLAM HCL 2 MG/2ML IJ SOLN
INTRAMUSCULAR | Status: AC
  Filled 2018-03-19: qty 2

## 2018-03-19 MED ORDER — IODIXANOL 320 MG/ML IV SOLN
INTRAVENOUS | Status: DC | PRN
  Administered 2018-03-19: 50 mL via INTRAVENOUS

## 2018-03-19 MED ORDER — ONDANSETRON HCL 4 MG/2ML IJ SOLN
INTRAMUSCULAR | Status: DC | PRN
  Administered 2018-03-19: 4 mg via INTRAVENOUS

## 2018-03-19 MED ORDER — PHENYLEPHRINE HCL-NACL 20-0.9 MG/250ML-% IV SOLN
0.0000 ug/min | INTRAVENOUS | Status: DC
  Administered 2018-03-19: 20 ug/min via INTRAVENOUS
  Administered 2018-03-20: 25 ug/min via INTRAVENOUS
  Filled 2018-03-19 (×3): qty 250

## 2018-03-19 MED ORDER — LIDOCAINE HCL (PF) 1 % IJ SOLN
INTRAMUSCULAR | Status: AC
  Filled 2018-03-19: qty 30

## 2018-03-19 MED ORDER — HEPARIN SODIUM (PORCINE) 1000 UNIT/ML IJ SOLN
INTRAMUSCULAR | Status: DC | PRN
  Administered 2018-03-19: 10000 [IU] via INTRAVENOUS

## 2018-03-19 MED ORDER — PROTAMINE SULFATE 10 MG/ML IV SOLN
INTRAVENOUS | Status: DC | PRN
  Administered 2018-03-19 (×2): 30 mg via INTRAVENOUS
  Administered 2018-03-19: 10 mg via INTRAVENOUS
  Administered 2018-03-19: 30 mg via INTRAVENOUS

## 2018-03-19 SURGICAL SUPPLY — 97 items
ADH SKN CLS APL DERMABOND .7 (GAUZE/BANDAGES/DRESSINGS) ×3
ADH SKN CLS LQ APL DERMABOND (GAUZE/BANDAGES/DRESSINGS) ×3
BAG DECANTER FOR FLEXI CONT (MISCELLANEOUS) IMPLANT
BAG SNAP BAND KOVER 36X36 (MISCELLANEOUS) ×6 IMPLANT
BLADE CLIPPER SURG (BLADE) IMPLANT
BLADE STERNUM SYSTEM 6 (BLADE) IMPLANT
CABLE ADAPT CONN TEMP 6FT (ADAPTER) ×4 IMPLANT
CANISTER SUCT 3000ML PPV (MISCELLANEOUS) IMPLANT
CANNULA FEM VENOUS REMOTE 22FR (CANNULA) IMPLANT
CANNULA OPTISITE PERFUSION 16F (CANNULA) IMPLANT
CANNULA OPTISITE PERFUSION 18F (CANNULA) IMPLANT
CATH BEACON 5 .035 65 KMP TIP (CATHETERS) ×2 IMPLANT
CATH DIAG EXPO 6F VENT PIG 145 (CATHETERS) ×8 IMPLANT
CATH EXPO 5FR AL1 (CATHETERS) IMPLANT
CATH EXTERNAL FEMALE PUREWICK (CATHETERS) IMPLANT
CATH INFINITI 6F AL2 (CATHETERS) IMPLANT
CATH S G BIP PACING (CATHETERS) ×4 IMPLANT
CLIP VESOCCLUDE MED 24/CT (CLIP) ×4 IMPLANT
CLIP VESOCCLUDE SM WIDE 24/CT (CLIP) ×4 IMPLANT
CONT SPEC 4OZ CLIKSEAL STRL BL (MISCELLANEOUS) ×8 IMPLANT
COVER BACK TABLE 80X110 HD (DRAPES) IMPLANT
COVER DOME SNAP 22 D (MISCELLANEOUS) IMPLANT
COVER WAND RF STERILE (DRAPES) ×4 IMPLANT
CRADLE DONUT ADULT HEAD (MISCELLANEOUS) ×4 IMPLANT
DECANTER SPIKE VIAL GLASS SM (MISCELLANEOUS) ×4 IMPLANT
DERMABOND ADHESIVE PROPEN (GAUZE/BANDAGES/DRESSINGS) ×1
DERMABOND ADVANCED (GAUZE/BANDAGES/DRESSINGS) ×1
DERMABOND ADVANCED .7 DNX12 (GAUZE/BANDAGES/DRESSINGS) ×3 IMPLANT
DERMABOND ADVANCED .7 DNX6 (GAUZE/BANDAGES/DRESSINGS) ×1 IMPLANT
DEVICE CLOSURE PERCLS PRGLD 6F (VASCULAR PRODUCTS) ×8 IMPLANT
DRAPE INCISE IOBAN 66X45 STRL (DRAPES) IMPLANT
DRSG TEGADERM 4X4.75 (GAUZE/BANDAGES/DRESSINGS) ×8 IMPLANT
ELECT CAUTERY BLADE 6.4 (BLADE) IMPLANT
ELECT REM PT RETURN 9FT ADLT (ELECTROSURGICAL) ×8
ELECTRODE REM PT RTRN 9FT ADLT (ELECTROSURGICAL) ×6 IMPLANT
FELT TEFLON 6X6 (MISCELLANEOUS) ×4 IMPLANT
FEMORAL VENOUS CANN RAP (CANNULA) IMPLANT
GAUZE SPONGE 4X4 12PLY STRL (GAUZE/BANDAGES/DRESSINGS) ×4 IMPLANT
GLOVE BIO SURGEON STRL SZ7.5 (GLOVE) IMPLANT
GLOVE BIO SURGEON STRL SZ8 (GLOVE) IMPLANT
GLOVE EUDERMIC 7 POWDERFREE (GLOVE) IMPLANT
GLOVE ORTHO TXT STRL SZ7.5 (GLOVE) IMPLANT
GOWN STRL REUS W/ TWL LRG LVL3 (GOWN DISPOSABLE) IMPLANT
GOWN STRL REUS W/ TWL XL LVL3 (GOWN DISPOSABLE) ×3 IMPLANT
GOWN STRL REUS W/TWL LRG LVL3 (GOWN DISPOSABLE)
GOWN STRL REUS W/TWL XL LVL3 (GOWN DISPOSABLE) ×4
GUIDEWIRE SAF TJ AMPL .035X180 (WIRE) ×4 IMPLANT
GUIDEWIRE SAFE TJ AMPLATZ EXST (WIRE) ×4 IMPLANT
GUIDEWIRE STRAIGHT .035 260CM (WIRE) ×4 IMPLANT
INSERT FOGARTY SM (MISCELLANEOUS) IMPLANT
KIT BASIN OR (CUSTOM PROCEDURE TRAY) ×4 IMPLANT
KIT DILATOR VASC 18G NDL (KITS) IMPLANT
KIT HEART LEFT (KITS) ×4 IMPLANT
KIT SUCTION CATH 14FR (SUCTIONS) ×8 IMPLANT
KIT TURNOVER KIT B (KITS) ×4 IMPLANT
LOOP VESSEL MAXI BLUE (MISCELLANEOUS) IMPLANT
LOOP VESSEL MINI RED (MISCELLANEOUS) IMPLANT
NDL PERC 18GX7CM (NEEDLE) ×2 IMPLANT
NEEDLE 22X1 1/2 (OR ONLY) (NEEDLE) ×4 IMPLANT
NEEDLE PERC 18GX7CM (NEEDLE) ×4 IMPLANT
NS IRRIG 1000ML POUR BTL (IV SOLUTION) ×4 IMPLANT
PACK ENDO MINOR (CUSTOM PROCEDURE TRAY) ×4 IMPLANT
PAD ARMBOARD 7.5X6 YLW CONV (MISCELLANEOUS) ×8 IMPLANT
PAD ELECT DEFIB RADIOL ZOLL (MISCELLANEOUS) ×4 IMPLANT
PENCIL BUTTON HOLSTER BLD 10FT (ELECTRODE) IMPLANT
PERCLOSE PROGLIDE 6F (VASCULAR PRODUCTS) ×16
SET MICROPUNCTURE 5F STIFF (MISCELLANEOUS) ×4 IMPLANT
SHEATH BRITE TIP 6FR 35CM (SHEATH) ×4 IMPLANT
SHEATH PINNACLE 6F 10CM (SHEATH) IMPLANT
SHEATH PINNACLE 8F 10CM (SHEATH) ×4 IMPLANT
SLEEVE REPOSITIONING LENGTH 30 (MISCELLANEOUS) ×4 IMPLANT
SPONGE LAP 4X18 RFD (DISPOSABLE) ×4 IMPLANT
STOPCOCK MORSE 400PSI 3WAY (MISCELLANEOUS) ×8 IMPLANT
SUT ETHIBOND X763 2 0 SH 1 (SUTURE) IMPLANT
SUT GORETEX CV 4 TH 22 36 (SUTURE) IMPLANT
SUT GORETEX CV4 TH-18 (SUTURE) IMPLANT
SUT MNCRL AB 3-0 PS2 18 (SUTURE) IMPLANT
SUT PROLENE 5 0 C 1 36 (SUTURE) IMPLANT
SUT PROLENE 6 0 C 1 30 (SUTURE) IMPLANT
SUT SILK  1 MH (SUTURE) ×1
SUT SILK 1 MH (SUTURE) ×3 IMPLANT
SUT VIC AB 2-0 CT1 27 (SUTURE)
SUT VIC AB 2-0 CT1 TAPERPNT 27 (SUTURE) IMPLANT
SUT VIC AB 2-0 CTX 36 (SUTURE) IMPLANT
SUT VIC AB 3-0 SH 8-18 (SUTURE) IMPLANT
SYR 50ML LL SCALE MARK (SYRINGE) ×4 IMPLANT
SYR BULB IRRIGATION 50ML (SYRINGE) IMPLANT
SYR CONTROL 10ML LL (SYRINGE) IMPLANT
TOWEL GREEN STERILE (TOWEL DISPOSABLE) ×8 IMPLANT
TRANSDUCER W/STOPCOCK (MISCELLANEOUS) ×8 IMPLANT
TRAY FOLEY SLVR 14FR TEMP STAT (SET/KITS/TRAYS/PACK) IMPLANT
TUBE SUCT INTRACARD DLP 20F (MISCELLANEOUS) IMPLANT
URINAL MALE W/LID DISP 1000CC (MISCELLANEOUS) IMPLANT
VALVE HEART TRANSCATH SZ3 29MM (Valve) ×2 IMPLANT
WIRE .035 3MM-J 145CM (WIRE) ×4 IMPLANT
WIRE BENTSON .035X145CM (WIRE) ×4 IMPLANT
WIRE LUNDERQUIST .035X180CM (WIRE) ×2 IMPLANT

## 2018-03-19 NOTE — Progress Notes (Signed)
      [address]       [place] [address]             [phone]     CARDIOTHORACIC SURGERY PROGRESS NOTE  Subjective: [name] has been scheduled for Procedure(s): TRANSCATHETER AORTIC VALVE REPLACEMENT, TRANSFEMORAL (N/A) TRANSESOPHAGEAL ECHOCARDIOGRAM (TEE) (N/A) today.   Objective: Vital signs in last 24 hours: Temp:  [97.4 F (36.3 C)-97.6 F (36.4 C)] 97.4 F (36.3 C) (02/11 0723) Pulse Rate:  [0-102] 60 (02/11 1007) Cardiac Rhythm: Ventricular paced (02/10 2018) Resp:  [0-81] 16 (02/11 1007) BP: (113-165)/(71-91) 148/74 (02/11 1007) SpO2:  [0 %-100 %] 99 % (02/11 1007) Weight:  [106.5 kg-108 kg] 106.5 kg (02/11 0820)  Physical Exam: Unchanged from previously   Intake/Output from previous day: 02/10 0701 - 02/11 0700 In: 240 [P.O.:240] Out: 300 [Urine:300] Intake/Output this shift: No intake/output data recorded.  Lab Results: Recent Labs    03/19/18 0544  WBC 6.5  HGB 14.8  HCT 44.5  PLT 173   BMET:  Recent Labs    03/19/18 0544  NA 139  K 4.4  CL 111  CO2 18*  GLUCOSE 109*  BUN 23  CREATININE 1.47*  CALCIUM 9.3    CBG (last 3)  No results for input(s): GLUCAP in the last 72 hours. PT/INR:   Recent Labs    03/18/18 0656  LABPROT 16.1*  INR 1.30    Assessment/Plan:   The various methods of treatment have been discussed with the patient. After consideration of the risks, benefits and treatment options the patient has consented to the planned procedure.   The patient has been seen and labs reviewed. There are no changes in the patient's condition to prevent proceeding with the planned procedure today.   Rexene Alberts, MD 03/19/2018 10:23 AM

## 2018-03-19 NOTE — Progress Notes (Signed)
  Echocardiogram 2D Echocardiogram has been performed.  [name] 03/19/2018, 1:39 PM

## 2018-03-19 NOTE — OR Nursing (Signed)
Patient on hold in OR starting at 1416. Awaiting room to be cleaned on 2H.   Leatha Gilding, RN

## 2018-03-19 NOTE — Progress Notes (Signed)
Patient ID: [name], male   DOB: 03-15-1931, 85 y.o.   MRN: [id number] T[facility] Evening Rounds:  Still on neo 30 mcg. 101/69  Internal pacer at 68  Awake and alert.  Right groin site dry, no hematoma. Tender. Left groin site fine.

## 2018-03-19 NOTE — Transfer of Care (Signed)
Immediate Anesthesia Transfer of Care Note  Patient: [name]  Procedure(s) Performed: TRANSCATHETER AORTIC VALVE REPLACEMENT, TRANSFEMORAL (N/A Chest) Intraoperative Transthoracic Echocardiogram (Chest)  Patient Location: ICU  Anesthesia Type:MAC  Level of Consciousness: awake, alert  and oriented  Airway & Oxygen Therapy: Patient Spontanous Breathing and Patient connected to face mask oxygen  Post-op Assessment: Report given to RN and Post -op Vital signs reviewed and stable  Post vital signs: Reviewed and stable  Last Vitals:  Vitals Value Taken Time  BP 121/78 03/19/2018  3:14 PM  Temp    Pulse 63 03/19/2018  3:21 PM  Resp 23 03/19/2018  3:21 PM  SpO2 96 % 03/19/2018  3:21 PM  Vitals shown include unvalidated device data.  Last Pain:  Vitals:   03/19/18 0723  TempSrc: Oral  PainSc:          Complications: No apparent anesthesia complications

## 2018-03-19 NOTE — Anesthesia Procedure Notes (Signed)
Procedure Name: MAC Date/Time: 03/19/2018 11:50 AM Performed by: Candis Shine, CRNA Pre-anesthesia Checklist: Patient identified, Emergency Drugs available, Suction available, Patient being monitored and Timeout performed Patient Re-evaluated:Patient Re-evaluated prior to induction Oxygen Delivery Method: Simple face mask Dental Injury: Teeth and Oropharynx as per pre-operative assessment

## 2018-03-19 NOTE — Telephone Encounter (Signed)
New Message    1. Are you calling in reference to your FMLA or disability form? no  2. What is your question in regards to FMLA or disability form? no   3. Do you need copies of your medical records? yes  4. Are you waiting on a nurse to call you back with results or are you wanting copies of your results? no  What is needed signed visit notes or medical notes for date of service 03/26/2017. She states that nothing is not signed which makes the notes not valid. Please use  CID Number is [id number] Fax number is  [phone]   Please route to Medical Records or your medical records site representative

## 2018-03-19 NOTE — Op Note (Signed)
[facility]   MULTIDISCIPLINARY HEART VALVE TEAM  [id number] TAVR OPERATIVE NOTE   Date of Procedure:  03/19/2018  Preoperative Diagnosis: Severe Aortic Stenosis   Postoperative Diagnosis: Same   Procedure:    Transcatheter Aortic Valve Replacement - Percutaneous Transfemoral Approach  Edwards Sapien 3 THV (size 29 mm, model # 9600TFX, serial # [id number])   Co-Surgeons:  Valentina Gu. Roxy Manns, MD and Sherren Mocha, MD  Anesthesiologist:  Arabella Merles, MD  Echocardiographer:  Sanda Klein, MD  Pre-operative Echo Findings:  Severe aortic stenosis  moderate left ventricular systolic dysfunction  Post-operative Echo Findings:  no paravalvular leak  unchanged left ventricular systolic function  BRIEF CLINICAL NOTE AND INDICATIONS FOR SURGERY  Please see the complete note of Dr Roxy Manns for details.    DETAILS OF THE OPERATIVE PROCEDURE  PREPARATION:   The patient is brought to the operating room on the above mentioned date and central monitoring was established by the anesthesia team including placement of a central venous catheter and radial arterial line. The patient is placed in the supine position on the operating table.  Intravenous antibiotics are administered. The patient is monitored closely throughout the procedure under conscious sedation.  Baseline transthoracic echocardiogram is performed. The patient's chest, abdomen, both groins, and both lower extremities are prepared and draped in a sterile manner. A time out procedure is performed.  PERIPHERAL ACCESS:   Using ultrasound guidance, femoral arterial and venous access is obtained with placement of 6 Fr sheaths on the left side.  A pigtail diagnostic catheter was passed through the femoral arterial sheath under fluoroscopic guidance into the aortic root.  A temporary transvenous pacemaker catheter was passed through the femoral venous sheath under fluoroscopic guidance into the right ventricle.   The pacemaker was tested to ensure stable lead placement and pacemaker capture. Aortic root angiography was performed in order to determine the optimal angiographic angle for valve deployment.  TRANSFEMORAL ACCESS:  A micropuncture technique is used to access the right femoral artery under fluoroscopic and ultrasound guidance.  2 Perclose devices are deployed at 10' and 2' positions to 'PreClose' the femoral artery. An 8 French sheath is placed and then a Lunderquist wire is advanced through the sheath. This is changed out for a 16 French transfemoral E-Sheath after progressively dilating over the Lunderquist wire.  An AL-2 catheter was used to direct a straight-tip exchange length wire across the native aortic valve into the left ventricle. This was exchanged out for a pigtail catheter and position was confirmed in the LV apex. Simultaneous LV and Ao pressures were recorded.  The pigtail catheter was exchanged for an Amplatz Extra-stiff wire in the LV apex.    BALLOON AORTIC VALVULOPLASTY:  Not performed  TRANSCATHETER HEART VALVE DEPLOYMENT:  An Edwards Sapien 3 transcatheter heart valve (size 29 mm, model #9600TFX, serial #[id number]) was prepared and crimped per manufacturer's guidelines, and the proper orientation of the valve is confirmed on the [employer] delivery system. The valve was advanced through the introducer sheath using normal technique until in an appropriate position in the abdominal aorta beyond the sheath tip. The balloon was then retracted and using the fine-tuning wheel was centered on the valve. The valve was then advanced across the aortic arch using appropriate flexion of the catheter. The valve was carefully positioned across the aortic valve annulus. The Commander catheter was retracted using normal technique. Once final position of the valve has been confirmed by angiographic assessment, the valve is deployed while temporarily  holding ventilation and during rapid ventricular  pacing to maintain systolic blood pressure < 50 mmHg and pulse pressure < 10 mmHg. The balloon inflation is held for >3 seconds after reaching full deployment volume. Once the balloon has fully deflated the balloon is retracted into the ascending aorta and valve function is assessed using echocardiography. There is felt to be no paravalvular leak and no central aortic insufficiency.  The patient's hemodynamic recovery following valve deployment is good.  The deployment balloon and guidewire are both removed. Echo demostrated acceptable post-procedural gradients, stable mitral valve function, and no aortic insufficiency.    PROCEDURE COMPLETION:  The sheath was removed and femoral artery closure is performed using the 2 previously deployed Perclose devices.  There was continued bleeding from the arteriotomy site after the sutures are tightened. A third Perclose device is deployed with incomplete hemostasis. At that point protamine is administered and manual pressure used to obtain complete hemostasis.The temporary pacemaker, pigtail catheters andThe site is clear with no evidence of bleeding or hematom femoral sheaths were removed with manual pressure used for hemostasis.   The patient tolerated the procedure well and is transported to the surgical intensive care in stable condition. There were no immediate intraoperative complications. All sponge instrument and needle counts are verified correct at completion of the operation.   The patient received a total of 40 mL of intravenous contrast during the procedure.   Sherren Mocha, MD 03/19/2018 9:09 PM

## 2018-03-19 NOTE — Anesthesia Procedure Notes (Signed)
Arterial Line Insertion Start/End2/12/2018 9:45 AM, 03/19/2018 9:55 AM Performed by: Lance Coon, CRNA, CRNA  Patient location: OR. Preanesthetic checklist: patient identified, IV checked, site marked, risks and benefits discussed, surgical consent, monitors and equipment checked, pre-op evaluation, timeout performed and anesthesia consent Lidocaine 1% used for infiltration and patient sedated Right, radial was placed Catheter size: 20 G Hand hygiene performed , maximum sterile barriers used  and Seldinger technique used  Attempts: 1 Procedure performed without using ultrasound guided technique. Following insertion, dressing applied and Biopatch. Post procedure assessment: normal and unchanged  Patient tolerated the procedure well with no immediate complications.

## 2018-03-19 NOTE — Op Note (Signed)
[facility]   MULTIDISCIPLINARY HEART VALVE TEAM   TAVR OPERATIVE NOTE   Date of Procedure:  03/19/2018  Preoperative Diagnosis: Severe Aortic Stenosis   Postoperative Diagnosis: Same   Procedure:    Transcatheter Aortic Valve Replacement - Percutaneous Right Transfemoral Approach  Edwards Sapien 3 THV (size 29 mm, model # 9600TFX, serial # [id number])   Co-Surgeons:  Valentina Gu. Roxy Manns, MD and Sherren Mocha, MD  Anesthesiologist:  Arabella Merles, MD  Echocardiographer:  Sanda Klein, MD  Pre-operative Echo Findings:  Severe aortic stenosis  Moderate left ventricular systolic dysfunction  Post-operative Echo Findings:  No paravalvular leak  Unchanged left ventricular systolic function   BRIEF CLINICAL NOTE AND INDICATIONS FOR SURGERY  Patient is an 85 year old male with history of aortic stenosis, coronary artery disease status post acute MI in 2011, paroxysmal atrial fibrillation, AV block status post permanent pacemaker placement, recurrent pulmonary emboli and 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  Patient's cardiac history dates back to 2011 when he presented with an acute inferior wall myocardial infarction.  He was found to have multivessel coronary artery disease and treated with PCI and stenting of the distal left circumflex coronary artery.  He has been followed regularly ever since by Dr. Wynonia Lawman until recently when he has been followed by Dr. Bettina Gavia.  He was noted to have a heart murmur and echocardiograms have documented the presence of aortic stenosis that has progressed in severity over time.   He had bilateral pulmonary emboli in 2013.  He underwent placement of Greenfield filter and has been anticoagulated using warfarin off and on ever since.  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  In 2016 he developed paroxysmal atrial fibrillation.  He later developed progressive AV block with intermittent complete heart block and ultimately underwent permanent pacemaker implantation by Dr. Lovena Le in February 2019.  Most recent follow-up transthoracic echocardiogram performed November 28, 2017 revealed moderate left ventricular systolic dysfunction with ejection fraction estimated 40 to 45%.  By report there was "moderate aortic stenosis" based upon peak velocity across the aortic valve measuring 3.6 m/s corresponding to mean transvalvular gradient estimated 29 mmHg.  The DVI was reported 0.22 and the aortic valve area estimated 0.92 cm.  The patient was recently seen in follow-up by Dr. Bettina Gavia who felt the patient's echocardiogram was suggestive of the presence of low gradient, low ejection fraction severe aortic stenosis.  The patient was referred to the multidisciplinary heart valve clinic and underwent diagnostic cardiac catheterization by Dr. Burt Knack on February 28, 2018.  The patient was found to have severe multivessel coronary artery disease including high-grade stenosis of the distal left circumflex coronary artery in the right coronary artery.  Hemodynamic findings were consistent with severe low gradient low ejection fraction aortic stenosis.  Mean transvalvular gradient was measured 28 mmHg with peak instantaneous gradient 39 mmHg.  Right heart catheterization was not performed.  Cardiothoracic surgical consultation was requested.    During the course of the patient's preoperative work up they have been evaluated comprehensively by a multidisciplinary team of specialists coordinated through the [facility] in the [facility].  They have been demonstrated to suffer from  symptomatic severe aortic stenosis as noted above. The patient has been counseled extensively as to the relative risks and benefits of all options for the treatment of severe aortic stenosis including long term medical therapy, conventional surgery for aortic valve replacement, and transcatheter aortic valve replacement.  All questions have been answered, and the patient provides full informed consent for the operation as described.   DETAILS OF THE OPERATIVE PROCEDURE  PREPARATION:    The patient is brought to the operating room on the above mentioned date and central monitoring was established by the anesthesia team including placement of a central venous line and radial arterial line. The patient is placed in the supine position on the operating table.  Intravenous antibiotics are administered. The patient is monitored closely throughout the procedure under conscious sedation.  A Foley catheter is placed.  Baseline transthoracic echocardiogram was performed. The patient's chest, abdomen, both groins, and both lower extremities are prepared and draped in a sterile manner. A time out procedure is performed.   PERIPHERAL ACCESS:    Using the modified Seldinger technique, femoral arterial and venous access was obtained with placement of 6 Fr sheaths on the left side.  A pigtail diagnostic catheter was passed through the left arterial sheath under fluoroscopic guidance into the aortic root.  A temporary transvenous pacemaker catheter was passed through the left femoral venous sheath under fluoroscopic guidance into the right ventricle.  The pacemaker was tested to ensure stable lead placement and pacemaker capture. Aortic root angiography was performed in order to determine the optimal angiographic angle for valve deployment.   TRANSFEMORAL ACCESS:   Percutaneous transfemoral access and sheath placement was performed by  Dr. Burt Knack using ultrasound guidance.  The right common femoral artery was cannulated using a micropuncture needle and appropriate location was verified using hand injection angiogram.  A pair of [employer] Perclose percutaneous closure devices were placed and a 6 French sheath replaced into the femoral artery.  The patient was heparinized systemically and ACT verified > 250 seconds.    A 16 Fr transfemoral E-sheath was introduced into the right common femoral artery after progressively dilating over an Amplatz superstiff wire. An AL-2 catheter was used to direct a straight-tip exchange length wire across the native aortic valve into the left ventricle. This was exchanged out for a pigtail catheter and position was confirmed in the LV apex. Simultaneous LV and Ao pressures were recorded.  The pigtail catheter was exchanged for an Amplatz Extra-stiff wire in the LV apex.  Echocardiography was utilized to confirm appropriate wire position and no sign of entanglement in the mitral subvalvular apparatus.   TRANSCATHETER HEART VALVE DEPLOYMENT:   An Edwards Sapien 3 transcatheter heart valve (size 29 mm, model #9600TFX, serial #[id number]) was prepared and crimped per manufacturer's guidelines, and the proper orientation of the valve is confirmed on the [employer] delivery system. The valve was advanced through the introducer sheath using normal technique until in an appropriate position in the abdominal aorta beyond the sheath tip. The balloon was then retracted and using the fine-tuning wheel was centered on the valve. The valve was then advanced across the aortic arch using appropriate flexion of the catheter. The valve was carefully positioned across the aortic valve annulus. The Commander catheter was retracted using normal technique. Once final position of the valve has been confirmed by angiographic assessment, the valve is deployed while temporarily holding ventilation and during rapid ventricular pacing  to maintain systolic blood pressure < 50 mmHg and pulse  pressure < 10 mmHg. The balloon inflation is held for >3 seconds after reaching full deployment volume. Once the balloon has fully deflated the balloon is retracted into the ascending aorta and valve function is assessed using echocardiography. There is felt to be no paravalvular leak and no central aortic insufficiency.  The patient's hemodynamic recovery following valve deployment is good.  The deployment balloon and guidewire are both removed.    PROCEDURE COMPLETION:   The sheath was removed and femoral artery closure performed by Dr Burt Knack.  Initially the Perclose sutures did not catch.  A third Perclose was placed.  Protamine was administered once femoral arterial repair was complete.  Manual pressure was held on the groin for in excess of 30 minutes to ensure satisfactory hemostasis.  The temporary pacemaker, pigtail catheters and femoral sheaths were removed with manual pressure used for hemostasis.   The patient tolerated the procedure well and is transported to the surgical intensive care in stable condition. There were no immediate intraoperative complications. All sponge instrument and needle counts are verified correct at completion of the operation.   No blood products were administered during the operation.  The patient received a total of 40.4 mL of intravenous contrast during the procedure.   Rexene Alberts, MD 03/19/2018 2:07 PM

## 2018-03-19 NOTE — Progress Notes (Signed)
  [place] VALVE TEAM  Patient doing well s/p TAVR. He is hemodynamically stable. Had failure of perclose device on right side but groin sites appear stable. Continue bedrest and close monitoring of groin. ECG shows paced rhythm.  Angelena Form PA-C  [facility]  Pager [phone]

## 2018-03-19 NOTE — Anesthesia Procedure Notes (Signed)
Central Venous Catheter Insertion Performed by: Roderic Palau, MD, anesthesiologist Start/End2/12/2018 9:45 AM, 03/19/2018 9:55 AM Patient location: Pre-op. Preanesthetic checklist: patient identified, IV checked, site marked, risks and benefits discussed, surgical consent, monitors and equipment checked, pre-op evaluation, timeout performed and anesthesia consent Position: Trendelenburg Lidocaine 1% used for infiltration and patient sedated Hand hygiene performed , maximum sterile barriers used  and Seldinger technique used Catheter size: 8 Fr Total catheter length 16. Central line was placed.Double lumen Procedure performed using ultrasound guided technique. Ultrasound Notes:anatomy identified, needle tip was noted to be adjacent to the nerve/plexus identified, no ultrasound evidence of intravascular and/or intraneural injection and image(s) printed for medical record Attempts: 1 Following insertion, dressing applied, line sutured and Biopatch. Post procedure assessment: blood return through all ports  Patient tolerated the procedure well with no immediate complications.

## 2018-03-20 ENCOUNTER — Encounter (HOSPITAL_COMMUNITY): Payer: Self-pay | Admitting: Cardiovascular Disease

## 2018-03-20 ENCOUNTER — Inpatient Hospital Stay (HOSPITAL_COMMUNITY): Payer: Medicare Other

## 2018-03-20 ENCOUNTER — Other Ambulatory Visit: Payer: Self-pay | Admitting: Physician Assistant

## 2018-03-20 DIAGNOSIS — Z952 Presence of prosthetic heart valve: Secondary | ICD-10-CM

## 2018-03-20 LAB — CBC
HCT: 37.1 % — ABNORMAL LOW (ref 39.0–52.0)
Hemoglobin: 12 g/dL — ABNORMAL LOW (ref 13.0–17.0)
MCH: 27.3 pg (ref 26.0–34.0)
MCHC: 32.3 g/dL (ref 30.0–36.0)
MCV: 84.5 fL (ref 80.0–100.0)
Platelets: 176 10*3/uL (ref 150–400)
RBC: 4.39 MIL/uL (ref 4.22–5.81)
RDW: 15.9 % — ABNORMAL HIGH (ref 11.5–15.5)
WBC: 12.6 10*3/uL — ABNORMAL HIGH (ref 4.0–10.5)
nRBC: 0 % (ref 0.0–0.2)

## 2018-03-20 LAB — HEMOGLOBIN AND HEMATOCRIT, BLOOD
HCT: 35 % — ABNORMAL LOW (ref 39.0–52.0)
HEMATOCRIT: 33.3 % — AB (ref 39.0–52.0)
HEMOGLOBIN: 11.2 g/dL — AB (ref 13.0–17.0)
Hemoglobin: 11.1 g/dL — ABNORMAL LOW (ref 13.0–17.0)

## 2018-03-20 LAB — BASIC METABOLIC PANEL
Anion gap: 7 (ref 5–15)
BUN: 26 mg/dL — ABNORMAL HIGH (ref 8–23)
CO2: 22 mmol/L (ref 22–32)
CREATININE: 1.61 mg/dL — AB (ref 0.61–1.24)
Calcium: 8.8 mg/dL — ABNORMAL LOW (ref 8.9–10.3)
Chloride: 109 mmol/L (ref 98–111)
GFR calc Af Amer: 45 mL/min — ABNORMAL LOW (ref >60)
GFR calc non Af Amer: 38 mL/min — ABNORMAL LOW (ref >60)
Glucose, Bld: 133 mg/dL — ABNORMAL HIGH (ref 70–99)
Potassium: 4.4 mmol/L (ref 3.5–5.1)
Sodium: 138 mmol/L (ref 135–145)

## 2018-03-20 LAB — ECHOCARDIOGRAM COMPLETE
Height: 72.008 in
Weight: 3806.02 oz

## 2018-03-20 LAB — MAGNESIUM: Magnesium: 1.7 mg/dL (ref 1.7–2.4)

## 2018-03-20 MED ORDER — SODIUM CHLORIDE 0.9 % IV BOLUS
500.0000 mL | Freq: Once | INTRAVENOUS | Status: AC
  Administered 2018-03-20: 500 mL via INTRAVENOUS

## 2018-03-20 MED FILL — Magnesium Sulfate Inj 50%: INTRAMUSCULAR | Qty: 10 | Status: AC

## 2018-03-20 MED FILL — Heparin Sodium (Porcine) Inj 1000 Unit/ML: INTRAMUSCULAR | Qty: 30 | Status: AC

## 2018-03-20 MED FILL — Potassium Chloride Inj 2 mEq/ML: INTRAVENOUS | Qty: 40 | Status: AC

## 2018-03-20 NOTE — Progress Notes (Addendum)
[facility]   MULTIDISCIPLINARY HEART VALVE TEAM  Patient Name: [name] Date of Encounter: 03/20/2018  Primary Cardiologist: Dr. Bettina Gavia / Dr. Burt Knack & Dr. Roxy Manns (TAVR)   Hospital Problem List     Principal Problem:   S/P TAVR (transcatheter aortic valve replacement) Active Problems:   Personal history of malignant neoplasm of prostate   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)   Mixed hyperlipidemia   Coronary artery disease involving native coronary artery of native heart with angina pectoris (HCC)   Persistent atrial fibrillation   OSA treated with BiPAP   Cardiac pacemaker in situ   Chronic combined systolic and diastolic heart failure (HCC)   Severe aortic stenosis   CKD (chronic kidney disease), stage III (HCC)     Subjective   Complaining of pain in groin. Otherwise feeling okay.   Inpatient Medications    Scheduled Meds: . aspirin EC  81 mg Oral Daily  . clopidogrel  75 mg Oral Daily  . docusate sodium  100 mg Oral Daily  . fenofibrate  160 mg Oral Daily  . pantoprazole  40 mg Oral Daily  . pravastatin  20 mg Oral q1800  . sodium chloride flush  3 mL Intravenous Q12H   Continuous Infusions: . sodium chloride Stopped (03/20/18 0639)  . cefUROXime (ZINACEF)  IV 200 mL/hr at 03/20/18 0700  . nitroGLYCERIN    . phenylephrine (NEO-SYNEPHRINE) Adult infusion 50 mcg/min (03/20/18 0700)  . sodium chloride     PRN Meds: sodium chloride, acetaminophen **OR** acetaminophen, diphenhydrAMINE, metoprolol tartrate, morphine injection, ondansetron (ZOFRAN) IV, oxyCODONE, sodium chloride flush, traMADol   Vital Signs    Vitals:   03/20/18 0645 03/20/18 0700 03/20/18 0715 03/20/18 0730  BP: (!) 98/58 96/69 (!) 83/52 (!) 88/53  Pulse: 66 65 61 62  Resp: (!) 22 15 (!) 24 (!) 23  Temp:      TempSrc:      SpO2: 96% 95% 97% 95%  Weight:      Height:        Intake/Output Summary (Last 24 hours) at 03/20/2018  0902 Last data filed at 03/20/2018 0700 Gross per 24 hour  Intake 2004.7 ml  Output 895 ml  Net 1109.7 ml   Filed Weights   03/19/18 0445 03/19/18 0820 03/20/18 0500  Weight: 106.5 kg 106.5 kg 107.9 kg    Physical Exam    GEN: Well nourished, well developed, in no acute distress. obese HEENT: Grossly normal.  Neck: Supple, no JVD, carotid bruits, or masses. Cardiac: RRR, no murmurs, rubs, or gallops. No clubbing, cyanosis, edema.  Radials/DP/PT 2+ and equal bilaterally.  Respiratory:  Respirations regular and unlabored, clear to auscultation bilaterally. GI: Soft, nontender, nondistended, BS + x 4. MS: no deformity or atrophy. Skin: warm and dry, no rash. Right groin with mild hematoma and tenderness in RLQ of abdomen Neuro:  Strength and sensation are intact. Psych: AAOx3.  Normal affect.  Labs    CBC Recent Labs    03/19/18 0544  03/19/18 1535 03/20/18 0315  WBC 6.5  --   --  12.6*  HGB 14.8   < > 13.9 12.0*  HCT 44.5   < > 41.0 37.1*  MCV 84.1  --   --  84.5  PLT 173  --   --  176   < > = values in this interval not displayed.   Basic Metabolic Panel Recent Labs    03/19/18 0544  03/19/18  1535 03/20/18 0315  NA 139   < > 141 138  K 4.4   < > 4.2 4.4  CL 111  --   --  109  CO2 18*  --   --  22  GLUCOSE 109*   < > 124* 133*  BUN 23  --   --  26*  CREATININE 1.47*  --   --  1.61*  CALCIUM 9.3  --   --  8.8*  MG  --   --   --  1.7   < > = values in this interval not displayed.   Liver Function Tests No results for input(s): AST, ALT, ALKPHOS, BILITOT, PROT, ALBUMIN in the last 72 hours. No results for input(s): LIPASE, AMYLASE in the last 72 hours. Cardiac Enzymes No results for input(s): CKTOTAL, CKMB, CKMBINDEX, TROPONINI in the last 72 hours. BNP Invalid input(s): POCBNP D-Dimer No results for input(s): DDIMER in the last 72 hours. Hemoglobin A1C Recent Labs    03/18/18 1946  HGBA1C 6.3*   Fasting Lipid Panel No results for input(s): CHOL,  HDL, LDLCALC, TRIG, CHOLHDL, LDLDIRECT in the last 72 hours. Thyroid Function Tests No results for input(s): TSH, T4TOTAL, T3FREE, THYROIDAB in the last 72 hours.  Invalid input(s): FREET3  Telemetry    paced - Personally Reviewed  ECG    Paced with underlying afib - Personally Reviewed  Radiology    Dg Chest 2 View  Result Date: 03/18/2018 CLINICAL DATA:  Preop EXAM: CHEST - 2 VIEW COMPARISON:  02/19/2018 FINDINGS: Patient unable to raise the arm which limits the lateral view. Left-sided pacing device as before. No acute airspace disease. Mild cardiomegaly with aortic atherosclerosis. No pneumothorax. IMPRESSION: No active cardiopulmonary disease.  Mild cardiomegaly. Electronically Signed   By: Donavan Foil M.D.   On: 03/18/2018 22:15   Dg Chest Port 1 View  Result Date: 03/19/2018 CLINICAL DATA:  Post TAVR, history CHF, former smoker, coronary artery disease, hypertension EXAM: PORTABLE CHEST 1 VIEW COMPARISON:  Portable exam 1534 hours compared to 03/18/2018 FINDINGS: RIGHT jugular line with tip projecting over RIGHT brachiocephalic vein. LEFT subclavian pacemaker with leads projecting over RIGHT atrium, RIGHT ventricle and coronary sinus. Enlargement of cardiac silhouette post TAVR. Atherosclerotic calcification aorta. Minimal bibasilar atelectasis. Lungs otherwise clear. No infiltrate, pleural effusion or pneumothorax. BILATERAL glenohumeral degenerative changes. IMPRESSION: Enlargement of cardiac silhouette post pacemaker and interval TAVR. Minimal bibasilar atelectasis. Electronically Signed   By: Lavonia Dana M.D.   On: 03/19/2018 15:52    Cardiac Studies   03/18/18 CORONARY STENT INTERVENTION  Conclusion     Ost 3rd Mrg lesion is 100% stenosed.  Ost 2nd Mrg to 2nd Mrg lesion is 80% stenosed.  Prox Cx to Mid Cx lesion is 20% stenosed.  Prox RCA to Mid RCA lesion is 50% stenosed.  Mid RCA lesion is 75% stenosed.  Dist RCA lesion is 50% stenosed.  A drug-eluting  stent was successfully placed using a STENT SYNERGY DES 2.5X32.  Post intervention, there is a 0% residual stenosis.  Post intervention, there is a 0% residual stenosis.   Successful PCI of severe stenosis throughout the proximal and mid RCA using overlapping drug-eluting stents  Plan: Admit for hydration overnight with plans for TAVR tomorrow   ________________     TAVR OPERATIVE NOTE   Date of Procedure:                03/19/2018  Preoperative Diagnosis:      Severe Aortic Stenosis  Postoperative Diagnosis:    Same   Procedure:        Transcatheter Aortic Valve Replacement - Percutaneous Right Transfemoral Approach             Edwards Sapien 3 THV (size 29 mm, model # 9600TFX, serial # [id number])              Co-Surgeons:                        Valentina Gu. Roxy Manns, MD and Sherren Mocha, MD  Anesthesiologist:                  Arabella Merles, MD  Echocardiographer:              Sanda Klein, MD  Pre-operative Echo Findings: ? Severe aortic stenosis ? Moderate left ventricular systolic dysfunction  Post-operative Echo Findings: ? No paravalvular leak ? Unchanged left ventricular systolic function  ______________  Echo 03/20/18: to be done today    Patient Profile     [name] is a 85 y.o. male with a history of CAD, persistent afib on Coumadin, recurrent DVT/PE s/p IVC filter, HTN, chronic combined S/D CHF, prostate CA s/p radical prostatectomy, chronic urinary incontinence, CAD and severe AS who presented to [facility] on 03/18/18 for planned PCI followed by TAVR.    Assessment & Plan    Severe AS: s/p successful TAVR with a 29 mm Edwards Sapien 3 THV via the TF approach on 03/19/18. Post operative echo pending.  ECG with paced rhythm. Continue Asprin and plavix  Hematoma: had failure of perclose device on right side but groin sites appear stable. He is having significant tenderness on right side. Hg dropped 15.8--> 12. Plan to repeat a H/H  this afternoon. If blood counts continue to drop will get an abdominal CT.   CAD: s/p successful PCI of severe stenosis throughout the proximal and mid RCA using overlapping drug-eluting stents. Continue DAPT with Aspirin and Plavix  Hypotension: still on neo. Also not making a lot of urine. Will give 500 cc NS bolus and wean pressors as tolerated.   Recurrent DVT/PE: he has an IVC filter in place. Coumadin currently on hold  Chronic afib: coumadin on hold given TAVR  S/p PPM: followed by Dr. Lovena Le  CKD stage III: creat 1.47--> 1.61. Fluid bolus as above  Signed, Angelena Form, PA-C  03/20/2018, 9:02 AM  Pager [phone]  Patient seen, examined. Available data reviewed. Agree with findings, assessment, and plan as outlined by Nell Range, PA-C.  On my exam the patient is alert and oriented, in no distress.  JVP is normal, lung fields are clear, heart is regular rate and rhythm with a 2/6 systolic ejection murmur at the right upper sternal border.  There is no diastolic murmur.  Abdomen is soft with tenderness over the right lower quadrant just above the inguinal ligament.  The right groin site is clear with a small amount of ecchymoses but no firm hematoma.  The left groin site is clear with no hematoma or bruising.  Extremities have no edema.  The patient is clinically stable.  He is currently weaning off of Neo-Synephrine.  He has received small bolus of fluid.  He has no chest pain or shortness of breath and appears euvolemic.  Creatinine appears stable.  He may have a small rectus sheath hematoma to account for his right lower quadrant pain.  We will repeat a hemoglobin this afternoon and  if it continues to trend down may consider a noncontrast abdomen and pelvis CT.  I do not think he is having any active bleeding at this time.  Otherwise plan to mobilize him and potentially discharge home tomorrow morning.  His postoperative echo looks very good with normal function of his transcatheter  heart valve, mean gradient of 8 mmHg, and no paravalvular regurgitation.  Sherren Mocha, M.D. 03/20/2018 1:09 PM

## 2018-03-20 NOTE — Progress Notes (Addendum)
R groin site with increased ecchymosis. Site soft. Pt states he is pain free. hgb 10.5 on istat; down from 11.2 at 1300. H&H drawn sent down to lab. Nell Range, PA notified. RN instructed to continue to monitor site and pt and page PA w/ any critical lab results if no new orders placed.

## 2018-03-20 NOTE — Progress Notes (Signed)
Patient refused CPAP at this time.

## 2018-03-20 NOTE — Progress Notes (Signed)
  Echocardiogram 2D Echocardiogram has been performed.  [name] 03/20/2018, 10:47 AM

## 2018-03-20 NOTE — Progress Notes (Signed)
Patient refused CPAP, vitals are stable. Will continue to monitor patient

## 2018-03-20 NOTE — Discharge Instructions (Signed)

## 2018-03-20 NOTE — Anesthesia Postprocedure Evaluation (Signed)
Anesthesia Post Note  Patient: [name]  Procedure(s) Performed: TRANSCATHETER AORTIC VALVE REPLACEMENT, TRANSFEMORAL (N/A Chest) Intraoperative Transthoracic Echocardiogram (Chest)     Patient location during evaluation: ICU Anesthesia Type: MAC Level of consciousness: awake and alert Pain management: pain level controlled Vital Signs Assessment: post-procedure vital signs reviewed and stable Respiratory status: spontaneous breathing, nonlabored ventilation, respiratory function stable and patient connected to nasal cannula oxygen Cardiovascular status: stable and blood pressure returned to baseline Postop Assessment: no apparent nausea or vomiting Anesthetic complications: no    Last Vitals:  Vitals:   03/20/18 0715 03/20/18 0730  BP: (!) 83/52 (!) 88/53  Pulse: 61 62  Resp: (!) 24 (!) 23  Temp:    SpO2: 97% 95%    Last Pain:  Vitals:   03/20/18 0056  TempSrc:   PainSc: Asleep                 Marquette Piontek,W. EDMOND

## 2018-03-21 ENCOUNTER — Other Ambulatory Visit: Payer: Self-pay

## 2018-03-21 ENCOUNTER — Inpatient Hospital Stay (HOSPITAL_COMMUNITY): Payer: Medicare Other

## 2018-03-21 DIAGNOSIS — Z954 Presence of other heart-valve replacement: Secondary | ICD-10-CM

## 2018-03-21 DIAGNOSIS — I350 Nonrheumatic aortic (valve) stenosis: Secondary | ICD-10-CM

## 2018-03-21 LAB — BASIC METABOLIC PANEL
Anion gap: 7 (ref 5–15)
BUN: 34 mg/dL — ABNORMAL HIGH (ref 8–23)
CO2: 23 mmol/L (ref 22–32)
Calcium: 8.5 mg/dL — ABNORMAL LOW (ref 8.9–10.3)
Chloride: 107 mmol/L (ref 98–111)
Creatinine, Ser: 2.11 mg/dL — ABNORMAL HIGH (ref 0.61–1.24)
GFR calc Af Amer: 32 mL/min — ABNORMAL LOW (ref >60)
GFR calc non Af Amer: 28 mL/min — ABNORMAL LOW (ref >60)
GLUCOSE: 123 mg/dL — AB (ref 70–99)
Potassium: 4.3 mmol/L (ref 3.5–5.1)
Sodium: 137 mmol/L (ref 135–145)

## 2018-03-21 LAB — CBC
HCT: 30.5 % — ABNORMAL LOW (ref 39.0–52.0)
HEMOGLOBIN: 9.8 g/dL — AB (ref 13.0–17.0)
MCH: 27.2 pg (ref 26.0–34.0)
MCHC: 32.1 g/dL (ref 30.0–36.0)
MCV: 84.7 fL (ref 80.0–100.0)
Platelets: 132 10*3/uL — ABNORMAL LOW (ref 150–400)
RBC: 3.6 MIL/uL — ABNORMAL LOW (ref 4.22–5.81)
RDW: 16 % — AB (ref 11.5–15.5)
WBC: 10.7 10*3/uL — ABNORMAL HIGH (ref 4.0–10.5)
nRBC: 0 % (ref 0.0–0.2)

## 2018-03-21 LAB — POCT I-STAT 4, (NA,K, GLUC, HGB,HCT)
Glucose, Bld: 153 mg/dL — ABNORMAL HIGH (ref 70–99)
HCT: 31 % — ABNORMAL LOW (ref 39.0–52.0)
Hemoglobin: 10.5 g/dL — ABNORMAL LOW (ref 13.0–17.0)
Potassium: 4.5 mmol/L (ref 3.5–5.1)
Sodium: 138 mmol/L (ref 135–145)

## 2018-03-21 MED ORDER — SODIUM CHLORIDE 0.9 % IV SOLN
INTRAVENOUS | Status: AC
  Administered 2018-03-21: 09:00:00 via INTRAVENOUS

## 2018-03-21 MED ORDER — SODIUM CHLORIDE 0.9 % IV SOLN: INTRAVENOUS | Status: DC

## 2018-03-21 NOTE — Progress Notes (Addendum)
[facility]   MULTIDISCIPLINARY HEART VALVE TEAM  Patient Name: [name] Date of Encounter: 03/21/2018  Primary Cardiologist: Dr. Bettina Gavia / Dr. Burt Knack & Dr. Roxy Manns (TAVR)   Hospital Problem List     Principal Problem:   S/P TAVR (transcatheter aortic valve replacement) Active Problems:   Personal history of malignant neoplasm of prostate   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)   Mixed hyperlipidemia   Coronary artery disease involving native coronary artery of native heart with angina pectoris (HCC)   Persistent atrial fibrillation   OSA treated with BiPAP   Cardiac pacemaker in situ   Chronic combined systolic and diastolic heart failure (HCC)   Severe aortic stenosis   CKD (chronic kidney disease), stage III ([place])     Subjective   Feeling okay. Groin tender. Eager to go home. He understands we would like to watch him an additional night.  Inpatient Medications    Scheduled Meds: . aspirin EC  81 mg Oral Daily  . clopidogrel  75 mg Oral Daily  . docusate sodium  100 mg Oral Daily  . fenofibrate  160 mg Oral Daily  . pantoprazole  40 mg Oral Daily  . pravastatin  20 mg Oral q1800  . sodium chloride flush  3 mL Intravenous Q12H   Continuous Infusions: . sodium chloride 10 mL/hr at 03/20/18 1500   PRN Meds: sodium chloride, acetaminophen **OR** acetaminophen, diphenhydrAMINE, metoprolol tartrate, ondansetron (ZOFRAN) IV, oxyCODONE, sodium chloride flush, traMADol   Vital Signs    Vitals:   03/20/18 2105 03/21/18 0415 03/21/18 0429 03/21/18 0812  BP: (!) 154/63 120/63  (!) 120/58  Pulse: 70 63  64  Resp: (!) 26 (!) 23  20  Temp: 97.7 F (36.5 C) 98.4 F (36.9 C)    TempSrc: Oral Oral    SpO2: 98% 98%  100%  Weight: 110.1 kg  109.7 kg   Height:        Intake/Output Summary (Last 24 hours) at 03/21/2018 0824 Last data filed at 03/21/2018 0818 Gross per 24 hour  Intake 1066.64 ml  Output 475 ml  Net  591.64 ml   Filed Weights   03/20/18 0500 03/20/18 2105 03/21/18 0429  Weight: 107.9 kg 110.1 kg 109.7 kg    Physical Exam   GEN: Well nourished, well developed, in no acute distress. obese HEENT: Grossly normal.  Neck: Supple, no JVD, carotid bruits, or masses. Cardiac: RRR, 2/6 SEM. No rubs, or gallops. No clubbing, cyanosis, edema.  Radials/DP/PT 2+ and equal bilaterally.  Respiratory:  Respirations regular and unlabored, clear to auscultation bilaterally. GI: Soft, nontender, nondistended, BS + x 4. MS: no deformity or atrophy. Skin: warm and dry, no rash. Right groin with soft hematoma. Tenderness in RLQ above inguinal ligament  Neuro:  Strength and sensation are intact. Psych: AAOx3.  Normal affect.  Labs    CBC Recent Labs    03/20/18 0315  03/20/18 1713 03/21/18 0311  WBC 12.6*  --   --  10.7*  HGB 12.0*   < > 11.1* 9.8*  HCT 37.1*   < > 33.3* 30.5*  MCV 84.5  --   --  84.7  PLT 176  --   --  132*   < > = values in this interval not displayed.   Basic Metabolic Panel Recent Labs    03/20/18 0315 03/21/18 0311  NA 138 137  K 4.4 4.3  CL 109 107  CO2 22  23  GLUCOSE 133* 123*  BUN 26* 34*  CREATININE 1.61* 2.11*  CALCIUM 8.8* 8.5*  MG 1.7  --    Liver Function Tests No results for input(s): AST, ALT, ALKPHOS, BILITOT, PROT, ALBUMIN in the last 72 hours. No results for input(s): LIPASE, AMYLASE in the last 72 hours. Cardiac Enzymes No results for input(s): CKTOTAL, CKMB, CKMBINDEX, TROPONINI in the last 72 hours. BNP Invalid input(s): POCBNP D-Dimer No results for input(s): DDIMER in the last 72 hours. Hemoglobin A1C Recent Labs    03/18/18 1946  HGBA1C 6.3*   Fasting Lipid Panel No results for input(s): CHOL, HDL, LDLCALC, TRIG, CHOLHDL, LDLDIRECT in the last 72 hours. Thyroid Function Tests No results for input(s): TSH, T4TOTAL, T3FREE, THYROIDAB in the last 72 hours.  Invalid input(s): FREET3  Telemetry     paced- Personally  Reviewed  ECG    paced - Personally Reviewed  Radiology    Dg Chest Port 1 View  Result Date: 03/19/2018 CLINICAL DATA:  Post TAVR, history CHF, former smoker, coronary artery disease, hypertension EXAM: PORTABLE CHEST 1 VIEW COMPARISON:  Portable exam 1534 hours compared to 03/18/2018 FINDINGS: RIGHT jugular line with tip projecting over RIGHT brachiocephalic vein. LEFT subclavian pacemaker with leads projecting over RIGHT atrium, RIGHT ventricle and coronary sinus. Enlargement of cardiac silhouette post TAVR. Atherosclerotic calcification aorta. Minimal bibasilar atelectasis. Lungs otherwise clear. No infiltrate, pleural effusion or pneumothorax. BILATERAL glenohumeral degenerative changes. IMPRESSION: Enlargement of cardiac silhouette post pacemaker and interval TAVR. Minimal bibasilar atelectasis. Electronically Signed   By: Lavonia Dana M.D.   On: 03/19/2018 15:52    Cardiac Studies   03/18/18 CORONARY STENT INTERVENTION  Conclusion     Ost 3rd Mrg lesion is 100% stenosed.  Ost 2nd Mrg to 2nd Mrg lesion is 80% stenosed.  Prox Cx to Mid Cx lesion is 20% stenosed.  Prox RCA to Mid RCA lesion is 50% stenosed.  Mid RCA lesion is 75% stenosed.  Dist RCA lesion is 50% stenosed.  A drug-eluting stent was successfully placed using a STENT SYNERGY DES 2.5X32.  Post intervention, there is a 0% residual stenosis.  Post intervention, there is a 0% residual stenosis.  Successful PCI of severe stenosis throughout the proximal and mid RCA using overlapping drug-eluting stents  Plan: Admit for hydration overnight with plans for TAVR tomorrow   ________________   TAVR OPERATIVE NOTE   Date of Procedure:03/19/2018  Preoperative Diagnosis:Severe Aortic Stenosis   Postoperative Diagnosis:Same   Procedure:   Transcatheter Aortic Valve Replacement - PercutaneousRightTransfemoral Approach Edwards Sapien 3 THV (size 41mm,  model # 9600TFX, serial # [id number])  Co-Surgeons: H. Roxy Manns, MD and Sherren Mocha, MD  Anesthesiologist:William Therisa Doyne, MD  Echocardiographer:Mihai Croitoru, MD  Pre-operative Echo Findings: ? Severe aortic stenosis ? Moderateleft ventricular systolic dysfunction  Post-operative Echo Findings: ? Noparavalvular leak ? Unchangedleft ventricular systolic function  ______________   Echo 03/20/18: IMPRESSIONS  1. The left ventricle has normal systolic function, with an ejection fraction of 55-60%. The cavity size was normal. There is moderately increased left ventricular wall thickness. Left ventricular diastology could not be evaluated secondary to atrial  fibrillation.  2. The right ventricle has normal systolic function. The cavity was mildly enlarged. There is no increase in right ventricular wall thickness.  3. Left atrial size was moderately dilated.  4. Right atrial size was mildly dilated.  5. The mitral valve is normal in structure. There is mild thickening. There is mild mitral annular calcification present.  6. The tricuspid  valve is normal in structure.  7. S/p TAVR. There is no aortic insufficiency.  8.  9. The pulmonic valve was normal in structure. 10. Right atrial pressure is estimated at 10 mmHg. 11. The interatrial septum was not assessed.   Patient Profile     [name] is a 85 y.o. male with a history of CAD, persistent afib on Coumadin, recurrent DVT/PE s/p IVC filter, HTN, chronic combined S/D CHF, prostate CA s/p radical prostatectomy, chronic urinary incontinence, CAD and severe AS who presented to [facility] on 03/18/18 for planned PCI followed by TAVR.   Assessment & Plan    Severe AS:s/p successful TAVR with a 29 mm Edwards Sapien 3 THV via the TF approach on 03/19/18. Post operative echo showed EF 55%, normally functioning TAVR valve with a mean gradient of 36mm Hg and no  PVL.  ECG with paced rhythm. Continue Asprin and plavix  Hematoma: had failure of perclose device on right side but groinsitesappearstable. Hg dropped 15.8--> 9.5. Groin exam shows stable, soft hematoma. He has significant tenderness in RLQ above inguinal ligament. Will plan to do a non contrast CT to further evaluate  AKI with baseline CKD stage III: creat 1.47--> 2.11. Likely mutlifactorial 2/2 pre renal azotemia from hypotension, contrast dye exposure x2 (cath and TAVR) and volume depletion. He was treated with 500cc IV fluid bolus yesterday. He is not on any nephrotoxic agents. Continue to monitor at this time. Will start gentle hydration.   CAD: s/p successful PCI of severe stenosis throughout the proximal and mid RCA using overlapping drug-eluting stents. Continue DAPT with Aspirin and Plavix  Hypotension: resolved. Now off pressors.   Recurrent DVT/PE: he has an IVC filter in place. Coumadin currently on hold  Chronic afib: coumadin on hold given TAVR  S/p PPM: followed by Dr. Lovena Le   Signed, Angelena Form, PA-C  03/21/2018, 8:24 AM  Pager [phone]  I have seen and examined the patient and agree with the assessment and plan as outlined.  Will keep in hospital 1 more day to monitor renal function.  Rexene Alberts, MD 03/21/2018 12:45 PM

## 2018-03-21 NOTE — Progress Notes (Signed)
RT Note:  Patient is still refusing CPAP.  Patient stated that he does not use one.

## 2018-03-21 NOTE — Progress Notes (Signed)
CARDIAC REHAB PHASE I   Did not walk with pt due to pending CT scan. Did begin education with pt and wife. Pt educated on improtance of ASA, Plavix, and NTG. Pt given heart healthy diet. Reviewed site care, and restrictions. Will follow-up tomorrow and walk as able.  [phone] Rufina Falco, RN BSN 03/21/2018 3:12 PM

## 2018-03-21 NOTE — Care Management Important Message (Signed)
Important Message  Patient Details  Name: KAYCEON OKI MRN: [id number] Date of Birth: 1931/06/25   Medicare Important Message Given:  Yes    Royalty Domagala P Krisanne Lich 03/21/2018, 1:09 PM

## 2018-03-21 NOTE — Progress Notes (Signed)
Remote pacemaker transmission.   

## 2018-03-22 LAB — BASIC METABOLIC PANEL
Anion gap: 7 (ref 5–15)
BUN: 28 mg/dL — ABNORMAL HIGH (ref 8–23)
CALCIUM: 8.8 mg/dL — AB (ref 8.9–10.3)
CO2: 22 mmol/L (ref 22–32)
Chloride: 110 mmol/L (ref 98–111)
Creatinine, Ser: 1.71 mg/dL — ABNORMAL HIGH (ref 0.61–1.24)
GFR calc Af Amer: 41 mL/min — ABNORMAL LOW (ref >60)
GFR calc non Af Amer: 36 mL/min — ABNORMAL LOW (ref >60)
Glucose, Bld: 110 mg/dL — ABNORMAL HIGH (ref 70–99)
Potassium: 4.3 mmol/L (ref 3.5–5.1)
Sodium: 139 mmol/L (ref 135–145)

## 2018-03-22 LAB — URINALYSIS, ROUTINE W REFLEX MICROSCOPIC
Bilirubin Urine: NEGATIVE
Glucose, UA: NEGATIVE mg/dL
Ketones, ur: NEGATIVE mg/dL
Nitrite: NEGATIVE
Protein, ur: NEGATIVE mg/dL
Specific Gravity, Urine: 1.014 (ref 1.005–1.030)
WBC, UA: >50 WBC/hpf — ABNORMAL HIGH (ref 0–5)
pH: 5 (ref 5.0–8.0)

## 2018-03-22 LAB — CBC
HCT: 27 % — ABNORMAL LOW (ref 39.0–52.0)
HEMOGLOBIN: 9.1 g/dL — AB (ref 13.0–17.0)
MCH: 28.3 pg (ref 26.0–34.0)
MCHC: 33.7 g/dL (ref 30.0–36.0)
MCV: 83.9 fL (ref 80.0–100.0)
Platelets: 130 10*3/uL — ABNORMAL LOW (ref 150–400)
RBC: 3.22 MIL/uL — ABNORMAL LOW (ref 4.22–5.81)
RDW: 15.9 % — ABNORMAL HIGH (ref 11.5–15.5)
WBC: 8.5 10*3/uL (ref 4.0–10.5)
nRBC: 0 % (ref 0.0–0.2)

## 2018-03-22 MED ORDER — CLOPIDOGREL BISULFATE 75 MG PO TABS: 75.0000 mg | ORAL_TABLET | Freq: Every day | ORAL | 1 refills | Status: DC

## 2018-03-22 MED ORDER — PANTOPRAZOLE SODIUM 40 MG PO TBEC: 40.0000 mg | DELAYED_RELEASE_TABLET | Freq: Every day | ORAL | 3 refills | Status: DC

## 2018-03-22 NOTE — Progress Notes (Signed)
Patient discharged.  Vital signs obtained and were stable.  CCMD called.  Peripheral IV removed.  AVS education provided to patient and wife.  All questions addressed.

## 2018-03-22 NOTE — Discharge Summary (Addendum)
[facility] TE[facility]  Discharge Summary    Patient ID: [name] MRN: [id number]; DOB: 13-Apr-1931  Admit date: 03/18/2018 Discharge date: 03/22/2018  Primary Care Provider: Shirline Frees, MD  Primary Cardiologist: Dr. Bettina Gavia / Dr. Burt Knack & Dr. Roxy Manns (TAVR)   Discharge Diagnoses    Principal Problem:   S/P TAVR (transcatheter aortic valve replacement) Active Problems:   Personal history of malignant neoplasm of prostate   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)   Mixed hyperlipidemia   Coronary artery disease involving native coronary artery of native heart with angina pectoris ([place])   Persistent atrial fibrillation   OSA treated with BiPAP   Cardiac pacemaker in situ   Chronic combined systolic and diastolic heart failure ([place])   Severe aortic stenosis   CKD (chronic kidney disease), stage III (HCC)   Allergies Allergies  Allergen Reactions  . Darifenacin Nausea Only    dry mouth and dizziness ENABLEX  . Lisinopril Cough    Diagnostic Studies/Procedures    03/18/18 CORONARY STENT INTERVENTION  Conclusion    Ost 3rd Mrg lesion is 100% stenosed.  Ost 2nd Mrg to 2nd Mrg lesion is 80% stenosed.  Prox Cx to Mid Cx lesion is 20% stenosed.  Prox RCA to Mid RCA lesion is 50% stenosed.  Mid RCA lesion is 75% stenosed.  Dist RCA lesion is 50% stenosed.  A drug-eluting stent was successfully placed using a STENT SYNERGY DES 2.5X32.  Post intervention, there is a 0% residual stenosis.  Post intervention, there is a 0% residual stenosis.  Successful PCI of severe stenosis throughout the proximal and mid RCA using overlapping drug-eluting stents  Plan: Admit for hydration overnight with plans for TAVR tomorrow   ________________   TAVR OPERATIVE NOTE Date of Procedure:03/19/2018  Preoperative Diagnosis:Severe Aortic Stenosis   Postoperative  Diagnosis:Same   Procedure:   Transcatheter Aortic Valve Replacement - PercutaneousRightTransfemoral Approach Edwards Sapien 3 THV (size 50mm, model # 9600TFX, serial # [id number])  Co-Surgeons:Clarence H. Roxy Manns, MD and Sherren Mocha, MD  Anesthesiologist:William Therisa Doyne, MD  Echocardiographer:Mihai Croitoru, MD  Pre-operative Echo Findings: ? Severe aortic stenosis ? Moderateleft ventricular systolic dysfunction  Post-operative Echo Findings: ? Noparavalvular leak ? Unchangedleft ventricular systolic function  ______________   Echo 03/20/18: IMPRESSIONS 1. The left ventricle has normal systolic function, with an ejection fraction of 55-60%. The cavity size was normal. There is moderately increased left ventricular wall thickness. Left ventricular diastology could not be evaluated secondary to atrial  fibrillation. 2. The right ventricle has normal systolic function. The cavity was mildly enlarged. There is no increase in right ventricular wall thickness. 3. Left atrial size was moderately dilated. 4. Right atrial size was mildly dilated. 5. The mitral valve is normal in structure. There is mild thickening. There is mild mitral annular calcification present. 6. The tricuspid valve is normal in structure. 7. S/p TAVR. There is no aortic insufficiency. 8. 9. The pulmonic valve was normal in structure. 10. Right atrial pressure is estimated at 10 mmHg. 11. The interatrial septum was not assessed.  _____________   CT abdomen 03/21/18  IMPRESSION: 1. Right retroperitoneal hematoma extends from the groin region up to the lower pole the right kidney. 2. Right renal stones with potential obstructed calyx in the upper pole right kidney. 3. Tiny right pleural effusion with trace perihepatic ascites. 4.  Aortic Atherosclerois (ICD10-170.0) 5. Previous  prostatectomy.   History of Present Illness  Noah P Massaroniis a 85 y.o.malewith a history of CAD, persistent afib on Coumadin,recurrent DVT/PE s/p IVC filter, HTN, chronic combined S/D CHF, prostate CA s/p radical prostatectomy, chronic urinary incontinence, CAD and severe AS who presented to [facility] [place] on 03/18/18 for planned PCI followed by TAVR.  Hospital Course     Consultants: none   Severe AS:s/p successful TAVR with a57mm Edwards Sapien 3 THV via the TF approach on 03/19/18. Post operative echo showed EF 55%, normally functioning TAVR valve with a mean gradient of 36mm Hg and no PVL. ECG with paced rhythm. Continue Asprin andplavix  Retroperitoneal hematoma: had failure of perclose device on right side but groinsitesappearstable.Hg dropped 15.8-->9.1. Groin exam shows stable, soft hematoma with ecchymosis over pubic bone into genitals. Non contrast CT 2/13 showed a right retroperitoneal hematoma 6.8 x 8.4 x 16.5 cm. Plan for discharge home today with light activity and close follow up with H/H in the office next week.   Acute blood loss anemia: Hg down 15.8-->9.1 related to retroperitoneal bleed, phlebotomy and hemodilution. No indication for transfusion. Continue to monitor   AKI with baseline CKD stage III: creat up to 2.11. Likely mutlifactorial 2/2 pre renal azotemia from hypotension, contrast dye exposure x2 (cath and TAVR) and volume depletion. He was treated with gentle IV fluids. Creat has improved to 1.7. He is not on any nephrotoxic agents. Plan to hold lasix 20mg  daily at discharge. I have asked them to call us if he develops any significant swelling. I will check a BMET in the office next week.   CAD: s/p successfulPCI of severe stenosis throughout the proximal and mid RCA using overlapping drug-eluting stents. Continue DAPT with Aspirin and Plavix  Hypotension: resolved. Now off pressors. BP starting to creep up. Will resume home amlodipine 2.5mg  at  discharge.   Recurrent DVT/PE: he has an IVC filter in place. Coumadin currently on hold given  recent TAVR and retroperitoneal hematoma. Will discuss timing of resumption in the outpatient setting   Chronic afib/flutter: coumadin on hold with recent TAVR and retroperitoneal hematoma. Will discuss timing of resumption in the outpatient setting   S/p PPM: followed by Dr. Lovena Le  GERD: Nexium switched to protonix given potential interaction with plavix.   Dysuria: he complains of dysuria only when supine. He did have a foley perioperatively. Will check a UA and call in ABx if indicated.  _____________  Discharge Vitals Blood pressure 103/62, pulse 70, temperature 98.2 F (36.8 C), temperature source Oral, resp. rate (!) 23, height 6' 0.01" (1.829 m), weight 109.5 kg, SpO2 99 %.  Filed Weights   03/20/18 2105 03/21/18 0429 03/22/18 0413  Weight: 110.1 kg 109.7 kg 109.5 kg   GEN: Well nourished, well developed, in no acute distress.  HEENT: Grossly normal.  Neck: Supple, no JVD, carotid bruits, or masses. Cardiac: RRR, 2/6 SEM. No rubs, or gallops. No clubbing, cyanosis, edema.  Radials/DP/PT 2+ and equal bilaterally.  Respiratory:  Respirations regular and unlabored, clear to auscultation bilaterally. GI: Soft, nontender, nondistended, BS + x 4. MS: no deformity or atrophy. Skin: warm and dry, no rash. Right groin with soft hematoma. Tenderness in RLQ above inguinal ligament (improved from previous days). Worsening ecchymosis over pubic bone into penis Neuro:  Strength and sensation are intact. Psych: AAOx3.  Normal affect.  Labs & Radiologic Studies    CBC Recent Labs    03/21/18 0311 03/22/18 0350  WBC 10.7* 8.5  HGB 9.8* 9.1*  HCT 30.5* 27.0*  MCV 84.7 83.9  PLT 132* 341*   Basic Metabolic Panel Recent Labs    03/20/18 0315  03/21/18 0311 03/22/18 0350  NA 138   < > 137 139  K 4.4   < > 4.3 4.3  CL 109  --  107 110  CO2 22  --  23 22  GLUCOSE 133*   < > 123*  110*  BUN 26*  --  34* 28*  CREATININE 1.61*  --  2.11* 1.71*  CALCIUM 8.8*  --  8.5* 8.8*  MG 1.7  --   --   --    < > = values in this interval not displayed.   Liver Function Tests No results for input(s): AST, ALT, ALKPHOS, BILITOT, PROT, ALBUMIN in the last 72 hours. No results for input(s): LIPASE, AMYLASE in the last 72 hours. Cardiac Enzymes No results for input(s): CKTOTAL, CKMB, CKMBINDEX, TROPONINI in the last 72 hours. BNP Invalid input(s): POCBNP D-Dimer No results for input(s): DDIMER in the last 72 hours. Hemoglobin A1C No results for input(s): HGBA1C in the last 72 hours. Fasting Lipid Panel No results for input(s): CHOL, HDL, LDLCALC, TRIG, CHOLHDL, LDLDIRECT in the last 72 hours. Thyroid Function Tests No results for input(s): TSH, T4TOTAL, T3FREE, THYROIDAB in the last 72 hours.  Invalid input(s): FREET3 _____________  Ct Abdomen Pelvis Wo Contrast  Result Date: 03/21/2018 CLINICAL DATA:  Right-sided abdominal pain. Right lower quadrant and groin pain after TAVR. EXAM: CT ABDOMEN AND PELVIS WITHOUT CONTRAST TECHNIQUE: Multidetector CT imaging of the abdomen and pelvis was performed following the standard protocol without IV contrast. COMPARISON:  03/07/2018 FINDINGS: Lower chest: No substantial pericardial effusion. Tiny right pleural effusion evident. Minimal atelectasis noted right dependent lung base. Hepatobiliary: No focal abnormality in the liver on this study without intravenous contrast. Gallbladder is distended with layering tiny calcified gallstones evident. No substantial pericholecystic edema or fluid. No intrahepatic or extrahepatic biliary dilation. Pancreas: No focal mass lesion. No dilatation of the main duct. No intraparenchymal cyst. No peripancreatic edema. Spleen: No splenomegaly. No focal mass lesion. Adrenals/Urinary Tract: No adrenal nodule or mass. Dilated upper pole calyx right kidney is similar to prior and likely due to a caliceal stone.  Nonobstructing right renal stones evident including 5 x 16 x 7 mm interpolar stone. 13 mm probable cyst interpolar left kidney. No evidence for hydroureter. The urinary bladder appears normal for the degree of distention. Stomach/Bowel: Stomach is unremarkable. No gastric wall thickening. No evidence of outlet obstruction. Duodenum is normally positioned as is the ligament of Treitz. No small bowel wall thickening. No small bowel dilatation. The terminal ileum is normal. The appendix is not visualized, but there is no edema or inflammation in the region of the cecum. No gross colonic mass. No colonic wall thickening. Vascular/Lymphatic: There is abdominal aortic atherosclerosis without aneurysm. IVC filter visualized in situ. There is no gastrohepatic or hepatoduodenal ligament lymphadenopathy. No intraperitoneal or retroperitoneal lymphadenopathy. Duplication of the IVC evident. No pelvic sidewall lymphadenopathy. Reproductive: Prostate gland surgically absent. Other: Trace free fluid is seen adjacent to the liver. Retroperitoneal hematoma identified right abdomen, extending from the region of the right groin up to the inferior pole the right kidney. Ill-defined infiltrating hematoma measures roughly 6.8 x 8.4 x 16.5 cm. Musculoskeletal: No worrisome lytic or sclerotic osseous abnormality. Degenerative changes noted lumbar spine. IMPRESSION: 1. Right retroperitoneal hematoma extends from the groin region up to the lower pole the right kidney. 2. Right renal stones with potential obstructed calyx in the upper pole  right kidney. 3. Tiny right pleural effusion with trace perihepatic ascites. 4.  Aortic Atherosclerois (ICD10-170.0) 5. Previous prostatectomy. Electronically Signed   By: Misty Stanley M.D.   On: 03/21/2018 11:10   Dg Chest 2 View  Result Date: 03/18/2018 CLINICAL DATA:  Preop EXAM: CHEST - 2 VIEW COMPARISON:  02/19/2018 FINDINGS: Patient unable to raise the arm which limits the lateral view.  Left-sided pacing device as before. No acute airspace disease. Mild cardiomegaly with aortic atherosclerosis. No pneumothorax. IMPRESSION: No active cardiopulmonary disease.  Mild cardiomegaly. Electronically Signed   By: Donavan Foil M.D.   On: 03/18/2018 22:15   Ct Coronary Morph W/cta Cor W/score W/ca W/cm &/or Wo/cm  Addendum Date: 03/11/2018   ADDENDUM REPORT: 03/11/2018 09:27 EXAM: OVER-READ INTERPRETATION  CT CHEST The following report is an over-read performed by radiologist Dr. Samara Snide [facility] on 03/11/2018. This over-read does not include interpretation of cardiac or coronary anatomy or pathology. The CTA interpretation by the cardiologist is attached. COMPARISON:  02/19/2018 chest radiograph.  11/15/2015 chest CT. FINDINGS: Please see the separate concurrent chest CT angiogram report for details. IMPRESSION: Please see the separate concurrent chest CT angiogram report for details. Electronically Signed   By: Ilona Sorrel M.D.   On: 03/11/2018 09:27   Result Date: 03/11/2018 CLINICAL DATA:  85 year old male with severe aortic stenosis being evaluated for a TAVR procedure. EXAM: Cardiac TAVR CT TECHNIQUE: The patient was scanned on a [employer]. A 120 kV retrospective scan was triggered in the descending thoracic aorta at 111 HU's. Gantry rotation speed was 250 msecs and collimation was .6 mm. No beta blockade or nitro were given. The 3D data set was reconstructed in 5% intervals of the R-R cycle. Systolic and diastolic phases were analyzed on a dedicated work station using MPR, MIP and VRT modes. The patient received 80 cc of contrast. FINDINGS: Aortic Valve: Trileaflet aortic valve with severely thickened and calcified leaflets, moderately restricted leaflet opening and asymmetric calcifications extending into the LVOT under the non-coronary cusp. Aorta: Normal size with moderate diffuse atherosclerotic plaque and calcifications predominantly in the aortic arch and  descending aorta and no dissection. Sinotubular Junction: 32 x 32 mm Ascending Thoracic Aorta: 40 x 39 mm Aortic Arch: 33.3 x 29.4 mm Descending Thoracic Aorta: 31 x 30 mm Sinus of Valsalva Measurements: Non-coronary: 32 mm Right -coronary: 35 mm Left -coronary: 35 mm Coronary Artery Height above Annulus: Left Main: 11 mm Right Coronary: 19 mm Virtual Basal Annulus Measurements: Maximum/Minimum Diameter: 30.9 x 25.6 mm Perimeter: 90.2 mm Area: 619 mm2 Optimum Fluoroscopic Angle for Delivery: LAO 1 CAU 1 IMPRESSION: 1. Trileaflet aortic valve with severely thickened and calcified leaflets, moderately restricted leaflet opening and asymmetric calcifications extending into the LVOT under the non-coronary cusp. Annular measurements suitable for delivery of a 29 mm Edwards-SAPIEN 3 valve. 2. Sufficient annulus to RCA distance, borderline annulus to LM distance (11 mm). 3. Optimum Fluoroscopic Angle for Delivery: LAO 1 CAU 1 4. The patient has a large left atrial appendage with a fairly large filling defect in the apex of the appendage including on delayed images. While this might represent poor contrast mixing, a thrombus can't be excluded. (The patient is on Warfarin). 5. Moderately dilated pulmonary artery measuring 36 mm suggestive of pulmonary hypertension. Electronically Signed: By: Ena Dawley On: 03/07/2018 18:08   Dg Chest Port 1 View  Result Date: 03/19/2018 CLINICAL DATA:  Post TAVR, history CHF, former smoker, coronary artery disease, hypertension EXAM:  PORTABLE CHEST 1 VIEW COMPARISON:  Portable exam 1534 hours compared to 03/18/2018 FINDINGS: RIGHT jugular line with tip projecting over RIGHT brachiocephalic vein. LEFT subclavian pacemaker with leads projecting over RIGHT atrium, RIGHT ventricle and coronary sinus. Enlargement of cardiac silhouette post TAVR. Atherosclerotic calcification aorta. Minimal bibasilar atelectasis. Lungs otherwise clear. No infiltrate, pleural effusion or pneumothorax.  BILATERAL glenohumeral degenerative changes. IMPRESSION: Enlargement of cardiac silhouette post pacemaker and interval TAVR. Minimal bibasilar atelectasis. Electronically Signed   By: Lavonia Dana M.D.   On: 03/19/2018 15:52   Ct Angio Chest Aorta W &/or Wo Contrast  Result Date: 03/11/2018 CLINICAL DATA:  Severe aortic stenosis. Pre-TAVR evaluation. EXAM: CT ANGIOGRAPHY CHEST, ABDOMEN AND PELVIS TECHNIQUE: Multidetector CT imaging through the chest, abdomen and pelvis was performed using the standard protocol during bolus administration of intravenous contrast. Multiplanar reconstructed images and MIPs were obtained and reviewed to evaluate the vascular anatomy. CONTRAST:  162mL ISOVUE-370 IOPAMIDOL (ISOVUE-370) INJECTION 76% COMPARISON:  03/20/2012 CT abdomen/pelvis. 11/15/2015 chest CT. 02/19/2018 chest radiograph. FINDINGS: CTA CHEST FINDINGS Cardiovascular: Mild cardiomegaly. No significant pericardial effusion/thickening. Three-vessel coronary atherosclerosis. Two lead left subclavian pacemaker is noted with lead tips in the right atrium and right ventricular apex. Severely thickened and calcified aortic valve. Atherosclerotic nonaneurysmal thoracic aorta. Mild atherosclerotic change in the ascending thoracic aorta. Top-normal caliber main pulmonary artery (3.2 cm diameter). No central pulmonary emboli. Mediastinum/Nodes: Stable heterogeneous hypodense 4.1 cm posterior left thyroid lobe nodule with internal calcifications. Unremarkable esophagus. No pathologically enlarged axillary, mediastinal or hilar lymph nodes. Lungs/Pleura: No pneumothorax. No pleural effusion. No acute consolidative airspace disease, lung masses or significant pulmonary nodules. Mild hypoventilatory changes in the dependent lungs. Musculoskeletal: No aggressive appearing focal osseous lesions. Mild thoracic spondylosis. CTA ABDOMEN AND PELVIS FINDINGS Hepatobiliary: Normal liver with no liver mass. Cholelithiasis. No biliary ductal  dilatation. Pancreas: Normal, with no mass or duct dilation. Spleen: Normal size. No mass. Adrenals/Urinary Tract: Normal adrenals. Nonobstructing 16 x 8 mm interpolar right renal stone. No hydronephrosis. Simple 1.7 cm interpolar left renal cyst. Scattered parapelvic renal cysts in the right kidney. Moderate renal cortical scarring scattered in the right kidney. Normal bladder. Stomach/Bowel: Normal non-distended stomach. Normal caliber small bowel with no small bowel wall thickening. Appendectomy. Normal large bowel with no diverticulosis, large bowel wall thickening or pericolonic fat stranding. Vascular/Lymphatic: Atherosclerotic abdominal aorta with ectatic 2.8 cm infrarenal abdominal aorta. 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 Right sided IVC filter is in place below the level of the renal veins. No pathologically enlarged lymph nodes in the abdomen or pelvis. Reproductive: Prostatectomy. Other: No pneumoperitoneum, ascites or focal fluid collection. Musculoskeletal: No aggressive appearing focal osseous lesions. Marked lumbar spondylosis. VASCULAR MEASUREMENTS PERTINENT TO TAVR: AORTA: Minimal Aortic Diameter-15.6 x 12.9 mm Severity of Aortic Calcification-severe RIGHT PELVIS: Right Common Iliac Artery - Minimal Diameter-9.1 x 6.8 mm Tortuosity-mild Calcification-moderate Right External Iliac Artery - Minimal Diameter-8.5 x 8.1 mm Tortuosity-moderate to severe Calcification-moderate Right Common Femoral Artery - Minimal Diameter-8.9 x 8.7 mm Tortuosity-mild Calcification-severe LEFT PELVIS: Left Common Iliac Artery - Minimal Diameter-9.6 x 9.1 mm Tortuosity-mild Calcification-moderate to severe Left External Iliac Artery - Minimal Diameter-9.0 x 8.1 mm Tortuosity-severe Calcification-moderate Left Common Femoral Artery - Minimal Diameter-9.3 x 9.0 mm Tortuosity-mild Calcification-moderate Review of the MIP images confirms the above findings. IMPRESSION: 1. Vascular findings and measurements  pertinent to potential TAVR procedure, as detailed. 2. Severe thickening and calcification of the aortic valve, compatible with the provided clinical history of severe aortic stenosis. 3.  Mild cardiomegaly. 4. Three-vessel coronary atherosclerosis. 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 6. Ectatic 2.8 cm infrarenal abdominal aorta, at risk for aneurysm development. Recommend follow-up aortic ultrasound in 5 years. This recommendation follows ACR consensus guidelines: White Paper of the ACR Incidental Findings Committee II on Vascular Findings. J Am Coll Radiol 2013; 10:789-794. 7.  Aortic Atherosclerosis (ICD10-I70.0). 8. Posterior left thyroid lobe 4.1 cm nodule, stable since 2017 chest CT. Outpatient thyroid ultrasound correlation is indicated if not previously performed. This follows ACR consensus guidelines: Managing Incidental Thyroid Nodules Detected on Imaging: White Paper of the ACR Incidental Thyroid Findings Committee. J Am Coll Radiol 2015; 12:143-150. 9. Cholelithiasis. 10. Right nephrolithiasis. Electronically Signed   By: Ilona Sorrel M.D.   On: 03/11/2018 10:14   Vas US Carotid  Result Date: 03/07/2018 Carotid Arterial Duplex Study Indications:   Pre TAVR. Other Factors: Severe AS. Performing Technologist: Toma Copier RVS  Examination Guidelines: A complete evaluation includes B-mode imaging, spectral Doppler, color Doppler, and power Doppler as needed of all accessible portions of each vessel. Bilateral testing is considered an integral part of a complete examination. Limited examinations for reoccurring indications may be performed as noted.  Right Carotid Findings: +----------+--------+--------+--------+---------------------+------------------+           PSV cm/sEDV cm/sStenosisDescribe             Comments           +----------+--------+--------+--------+---------------------+------------------+ CCA Prox  47      7                                                        +----------+--------+--------+--------+---------------------+------------------+ CCA Distal46      5                                    mild intimal                                                              changes            +----------+--------+--------+--------+---------------------+------------------+ ICA Prox  28      5               heterogenous and     mild plaque                                          irregular                               +----------+--------+--------+--------+---------------------+------------------+ ICA Mid   50      10                                                      +----------+--------+--------+--------+---------------------+------------------+  ICA Distal37      9                                    tortuous           +----------+--------+--------+--------+---------------------+------------------+ ECA       50      0                                    mild intimal                                                              changes            +----------+--------+--------+--------+---------------------+------------------+ +----------+--------+-------+--------+-------------------+           PSV cm/sEDV cmsDescribeArm Pressure (mmHG) +----------+--------+-------+--------+-------------------+ [id number]                                         +----------+--------+-------+--------+-------------------+ +---------+--------+--+--------+-+ VertebralPSV cm/s45EDV cm/s7 +---------+--------+--+--------+-+  Left Carotid Findings: +----------+--------+--------+--------+------------+-------------------------+           PSV cm/sEDV cm/sStenosisDescribe    Comments                  +----------+--------+--------+--------+------------+-------------------------+ CCA Prox  82      10                                                     +----------+--------+--------+--------+------------+-------------------------+ CCA Distal42      8                           mild intimal changes      +----------+--------+--------+--------+------------+-------------------------+ ICA Prox  41      7               heterogenousmild plaque at the origin +----------+--------+--------+--------+------------+-------------------------+ ICA Mid   42      10                                                    +----------+--------+--------+--------+------------+-------------------------+ ICA Distal50      11                          tortuous                  +----------+--------+--------+--------+------------+-------------------------+ ECA       56      3                           mild imtimal changes      +----------+--------+--------+--------+------------+-------------------------+ +----------+--------+--------+--------+-------------------+ SubclavianPSV cm/sEDV cm/sDescribeArm Pressure (mmHG) +----------+--------+--------+--------+-------------------+  115                                         +----------+--------+--------+--------+-------------------+ +---------+--------+--+--------+-+ VertebralPSV cm/s29EDV cm/s6 +---------+--------+--+--------+-+  Summary: Right Carotid: Velocities in the right ICA are consistent with a 1-39% stenosis. Left Carotid: Velocities in the left ICA are consistent with a 1-39% stenosis. Vertebrals:  Bilateral vertebral arteries demonstrate antegrade flow. Subclavians: Normal flow hemodynamics were seen in bilateral subclavian              arteries. *See table(s) above for measurements and observations.  Electronically signed by Servando Snare MD on 03/07/2018 at 2:43:50 PM.    Final    Ct Angio Abdomen Pelvis  W &/or Wo Contrast  Result Date: 03/11/2018 CLINICAL DATA:  Severe aortic stenosis. Pre-TAVR evaluation. EXAM: CT ANGIOGRAPHY CHEST, ABDOMEN AND PELVIS TECHNIQUE:  Multidetector CT imaging through the chest, abdomen and pelvis was performed using the standard protocol during bolus administration of intravenous contrast. Multiplanar reconstructed images and MIPs were obtained and reviewed to evaluate the vascular anatomy. CONTRAST:  160mL ISOVUE-370 IOPAMIDOL (ISOVUE-370) INJECTION 76% COMPARISON:  03/20/2012 CT abdomen/pelvis. 11/15/2015 chest CT. 02/19/2018 chest radiograph. FINDINGS: CTA CHEST FINDINGS Cardiovascular: Mild cardiomegaly. No significant pericardial effusion/thickening. Three-vessel coronary atherosclerosis. Two lead left subclavian pacemaker is noted with lead tips in the right atrium and right ventricular apex. Severely thickened and calcified aortic valve. Atherosclerotic nonaneurysmal thoracic aorta. Mild atherosclerotic change in the ascending thoracic aorta. Top-normal caliber main pulmonary artery (3.2 cm diameter). No central pulmonary emboli. Mediastinum/Nodes: Stable heterogeneous hypodense 4.1 cm posterior left thyroid lobe nodule with internal calcifications. Unremarkable esophagus. No pathologically enlarged axillary, mediastinal or hilar lymph nodes. Lungs/Pleura: No pneumothorax. No pleural effusion. No acute consolidative airspace disease, lung masses or significant pulmonary nodules. Mild hypoventilatory changes in the dependent lungs. Musculoskeletal: No aggressive appearing focal osseous lesions. Mild thoracic spondylosis. CTA ABDOMEN AND PELVIS FINDINGS Hepatobiliary: Normal liver with no liver mass. Cholelithiasis. No biliary ductal dilatation. Pancreas: Normal, with no mass or duct dilation. Spleen: Normal size. No mass. Adrenals/Urinary Tract: Normal adrenals. Nonobstructing 16 x 8 mm interpolar right renal stone. No hydronephrosis. Simple 1.7 cm interpolar left renal cyst. Scattered parapelvic renal cysts in the right kidney. Moderate renal cortical scarring scattered in the right kidney. Normal bladder. Stomach/Bowel: Normal  non-distended stomach. Normal caliber small bowel with no small bowel wall thickening. Appendectomy. Normal large bowel with no diverticulosis, large bowel wall thickening or pericolonic fat stranding. Vascular/Lymphatic: Atherosclerotic abdominal aorta with ectatic 2.8 cm infrarenal abdominal aorta. 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 Right sided IVC filter is in place below the level of the renal veins. No pathologically enlarged lymph nodes in the abdomen or pelvis. Reproductive: Prostatectomy. Other: No pneumoperitoneum, ascites or focal fluid collection. Musculoskeletal: No aggressive appearing focal osseous lesions. Marked lumbar spondylosis. VASCULAR MEASUREMENTS PERTINENT TO TAVR: AORTA: Minimal Aortic Diameter-15.6 x 12.9 mm Severity of Aortic Calcification-severe RIGHT PELVIS: Right Common Iliac Artery - Minimal Diameter-9.1 x 6.8 mm Tortuosity-mild Calcification-moderate Right External Iliac Artery - Minimal Diameter-8.5 x 8.1 mm Tortuosity-moderate to severe Calcification-moderate Right Common Femoral Artery - Minimal Diameter-8.9 x 8.7 mm Tortuosity-mild Calcification-severe LEFT PELVIS: Left Common Iliac Artery - Minimal Diameter-9.6 x 9.1 mm Tortuosity-mild Calcification-moderate to severe Left External Iliac Artery - Minimal Diameter-9.0 x 8.1 mm Tortuosity-severe Calcification-moderate Left Common Femoral Artery - Minimal Diameter-9.3 x 9.0 mm Tortuosity-mild Calcification-moderate Review of  the MIP images confirms the above findings. IMPRESSION: 1. Vascular findings and measurements pertinent to potential TAVR procedure, as detailed. 2. Severe thickening and calcification of the aortic valve, compatible with the provided clinical history of severe aortic stenosis. 3. Mild cardiomegaly. 4. Three-vessel coronary atherosclerosis. 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 6. Ectatic 2.8 cm infrarenal abdominal aorta, at risk for aneurysm development. Recommend follow-up aortic  ultrasound in 5 years. This recommendation follows ACR consensus guidelines: White Paper of the ACR Incidental Findings Committee II on Vascular Findings. J Am Coll Radiol 2013; 10:789-794. 7.  Aortic Atherosclerosis (ICD10-I70.0). 8. Posterior left thyroid lobe 4.1 cm nodule, stable since 2017 chest CT. Outpatient thyroid ultrasound correlation is indicated if not previously performed. This follows ACR consensus guidelines: Managing Incidental Thyroid Nodules Detected on Imaging: White Paper of the ACR Incidental Thyroid Findings Committee. J Am Coll Radiol 2015; 12:143-150. 9. Cholelithiasis. 10. Right nephrolithiasis. Electronically Signed   By: Ilona Sorrel M.D.   On: 03/11/2018 10:14   Disposition   Pt is being discharged home today in good condition.  Follow-up Plans & Appointments    Follow-up Information    Eileen Stanford, PA-C. Go on 03/27/2018.   Specialties:  Cardiology, Radiology Why:  @ 12:30pm, please arrive at least 10 minutes early. Contact information: [address] [place] [id number] [phone]            Discharge Medications   Allergies as of 03/22/2018      Reactions   Darifenacin Nausea Only   dry mouth and dizziness ENABLEX   Lisinopril Cough      Medication List    STOP taking these medications   esomeprazole 40 MG capsule Commonly known as:  Alicia by:  pantoprazole 40 MG tablet   furosemide 20 MG tablet Commonly known as:  LASIX   warfarin 2.5 MG tablet Commonly known as:  COUMADIN     TAKE these medications   ADULT GUMMY PO Take 1 tablet by mouth daily.   amLODipine 2.5 MG tablet Commonly known as:  NORVASC Take 2.5 mg by mouth every evening.   aspirin EC 81 MG tablet Take 1 tablet (81 mg total) by mouth daily.   Biotin 2500 MCG Caps Take 2,500 mcg by mouth daily.   cholecalciferol 1000 units tablet Commonly known as:  VITAMIN D Take 1,000 Units by mouth daily.   clopidogrel 75 MG  tablet Commonly known as:  PLAVIX Take 1 tablet (75 mg total) by mouth daily.   docusate sodium 50 MG capsule Commonly known as:  COLACE Take 50 mg by mouth daily.   fenofibrate 160 MG tablet Take 1 tablet (160 mg total) by mouth daily.   Fish Oil 1000 MG Caps Take 1,000 mg by mouth daily.   GLUCOSAMINE PO Take 2,000 mg by mouth daily.   hydroxypropyl methylcellulose / hypromellose 2.5 % ophthalmic solution Commonly known as:  ISOPTO TEARS / GONIOVISC Place 1 drop into both eyes 3 (three) times daily as needed for dry eyes.   nitroGLYCERIN 0.4 MG SL tablet Commonly known as:  NITROSTAT Place 1 tablet (0.4 mg total) under the tongue every 5 (five) minutes as needed.   pantoprazole 40 MG tablet Commonly known as:  PROTONIX Take 1 tablet (40 mg total) by mouth daily. Replaces:  esomeprazole 40 MG capsule   pravastatin 20 MG tablet Commonly known as:  PRAVACHOL TAKE 1 TABLET BY MOUTH EVERY DAY   UNISOM SLEEPGELS 50  MG Caps Generic drug:  diphenhydrAMINE HCl (Sleep) Take 50 mg by mouth at bedtime.   vitamin B-12 500 MCG tablet Commonly known as:  CYANOCOBALAMIN Take 500 mcg by mouth daily.   vitamin C 1000 MG tablet Take 1,000 mg by mouth daily.         Outstanding Labs/Studies   BMET and CBC. Potential repeat CT or [place] to reassess retroperitoneal bleed before restarting coumadin   Duration of Discharge Encounter   Greater than 30 minutes including physician time.  SignedAngelena Form, PA-C 03/22/2018, 9:22 AM [phone]

## 2018-03-22 NOTE — Progress Notes (Signed)
CARDIAC REHAB PHASE I   PRE:  Rate/Rhythm: 63 paced with PVCs  BP:  Sitting: 136/62      SaO2: 95 RA  MODE:  Ambulation: 250 ft   POST:  Rate/Rhythm: 75 paced with PVCs  BP:  Sitting: 154/66    SaO2: 94 RA   Pt ambulated 26ft in hallway standby assist with front wheel walker. Pt denies groin pain or SOB. Pt states he feels much better than pre-op. Pt returned to recliner. Reviewed exercise guidelines. Pt and wife deny questions or concerns re yesterdays education. Will refer to CRP II [place].  [id number] Rufina Falco, RN BSN 03/22/2018 10:46 AM

## 2018-03-25 ENCOUNTER — Telehealth: Payer: Self-pay | Admitting: Physician Assistant

## 2018-03-25 NOTE — Discharge Summary (Signed)
[facility]   MULTIDISCIPLINARY HEART VALVE TEAM  Patient Name: [name] Date of Encounter: 03/21/2018  Primary Cardiologist: Dr. Bettina Gavia / Dr. Burt Knack & Dr. Roxy Manns (TAVR)   Hospital Problem List     Principal Problem:   S/P TAVR (transcatheter aortic valve replacement) Active Problems:   Personal history of malignant neoplasm of prostate   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)   Mixed hyperlipidemia   Coronary artery disease involving native coronary artery of native heart with angina pectoris (HCC)   Persistent atrial fibrillation   OSA treated with BiPAP   Cardiac pacemaker in situ   Chronic combined systolic and diastolic heart failure (HCC)   Severe aortic stenosis   CKD (chronic kidney disease), stage III ([place])     Subjective   Feeling okay. Groin tender. Eager to go home. He understands we would like to watch him an additional night.  Inpatient Medications    Scheduled Meds: . aspirin EC  81 mg Oral Daily  . clopidogrel  75 mg Oral Daily  . docusate sodium  100 mg Oral Daily  . fenofibrate  160 mg Oral Daily  . pantoprazole  40 mg Oral Daily  . pravastatin  20 mg Oral q1800  . sodium chloride flush  3 mL Intravenous Q12H   Continuous Infusions: . sodium chloride 10 mL/hr at 03/20/18 1500   PRN Meds: sodium chloride, acetaminophen **OR** acetaminophen, diphenhydrAMINE, metoprolol tartrate, ondansetron (ZOFRAN) IV, oxyCODONE, sodium chloride flush, traMADol   Vital Signs    Vitals:   03/20/18 2105 03/21/18 0415 03/21/18 0429 03/21/18 0812  BP: (!) 154/63 120/63  (!) 120/58  Pulse: 70 63  64  Resp: (!) 26 (!) 23  20  Temp: 97.7 F (36.5 C) 98.4 F (36.9 C)    TempSrc: Oral Oral    SpO2: 98% 98%  100%  Weight: 110.1 kg  109.7 kg   Height:        Intake/Output Summary (Last 24 hours) at 03/21/2018 0824 Last data filed at 03/21/2018 0818 Gross per 24 hour  Intake 1066.64 ml  Output 475 ml  Net  591.64 ml   Filed Weights   03/20/18 0500 03/20/18 2105 03/21/18 0429  Weight: 107.9 kg 110.1 kg 109.7 kg    Physical Exam   GEN: Well nourished, well developed, in no acute distress. obese HEENT: Grossly normal.  Neck: Supple, no JVD, carotid bruits, or masses. Cardiac: RRR, 2/6 SEM. No rubs, or gallops. No clubbing, cyanosis, edema.  Radials/DP/PT 2+ and equal bilaterally.  Respiratory:  Respirations regular and unlabored, clear to auscultation bilaterally. GI: Soft, nontender, nondistended, BS + x 4. MS: no deformity or atrophy. Skin: warm and dry, no rash. Right groin with soft hematoma. Tenderness in RLQ above inguinal ligament  Neuro:  Strength and sensation are intact. Psych: AAOx3.  Normal affect.  Labs    CBC Recent Labs    03/20/18 0315  03/20/18 1713 03/21/18 0311  WBC 12.6*  --   --  10.7*  HGB 12.0*   < > 11.1* 9.8*  HCT 37.1*   < > 33.3* 30.5*  MCV 84.5  --   --  84.7  PLT 176  --   --  132*   < > = values in this interval not displayed.   Basic Metabolic Panel Recent Labs    03/20/18 0315 03/21/18 0311  NA 138 137  K 4.4 4.3  CL 109 107  CO2 22  23  GLUCOSE 133* 123*  BUN 26* 34*  CREATININE 1.61* 2.11*  CALCIUM 8.8* 8.5*  MG 1.7  --    Liver Function Tests No results for input(s): AST, ALT, ALKPHOS, BILITOT, PROT, ALBUMIN in the last 72 hours. No results for input(s): LIPASE, AMYLASE in the last 72 hours. Cardiac Enzymes No results for input(s): CKTOTAL, CKMB, CKMBINDEX, TROPONINI in the last 72 hours. BNP Invalid input(s): POCBNP D-Dimer No results for input(s): DDIMER in the last 72 hours. Hemoglobin A1C Recent Labs    03/18/18 1946  HGBA1C 6.3*   Fasting Lipid Panel No results for input(s): CHOL, HDL, LDLCALC, TRIG, CHOLHDL, LDLDIRECT in the last 72 hours. Thyroid Function Tests No results for input(s): TSH, T4TOTAL, T3FREE, THYROIDAB in the last 72 hours.  Invalid input(s): FREET3  Telemetry     paced- Personally  Reviewed  ECG    paced - Personally Reviewed  Radiology    Dg Chest Port 1 View  Result Date: 03/19/2018 CLINICAL DATA:  Post TAVR, history CHF, former smoker, coronary artery disease, hypertension EXAM: PORTABLE CHEST 1 VIEW COMPARISON:  Portable exam 1534 hours compared to 03/18/2018 FINDINGS: RIGHT jugular line with tip projecting over RIGHT brachiocephalic vein. LEFT subclavian pacemaker with leads projecting over RIGHT atrium, RIGHT ventricle and coronary sinus. Enlargement of cardiac silhouette post TAVR. Atherosclerotic calcification aorta. Minimal bibasilar atelectasis. Lungs otherwise clear. No infiltrate, pleural effusion or pneumothorax. BILATERAL glenohumeral degenerative changes. IMPRESSION: Enlargement of cardiac silhouette post pacemaker and interval TAVR. Minimal bibasilar atelectasis. Electronically Signed   By: Lavonia Dana M.D.   On: 03/19/2018 15:52    Cardiac Studies   03/18/18 CORONARY STENT INTERVENTION  Conclusion     Ost 3rd Mrg lesion is 100% stenosed.  Ost 2nd Mrg to 2nd Mrg lesion is 80% stenosed.  Prox Cx to Mid Cx lesion is 20% stenosed.  Prox RCA to Mid RCA lesion is 50% stenosed.  Mid RCA lesion is 75% stenosed.  Dist RCA lesion is 50% stenosed.  A drug-eluting stent was successfully placed using a STENT SYNERGY DES 2.5X32.  Post intervention, there is a 0% residual stenosis.  Post intervention, there is a 0% residual stenosis.  Successful PCI of severe stenosis throughout the proximal and mid RCA using overlapping drug-eluting stents  Plan: Admit for hydration overnight with plans for TAVR tomorrow   ________________   TAVR OPERATIVE NOTE   Date of Procedure:03/19/2018  Preoperative Diagnosis:Severe Aortic Stenosis   Postoperative Diagnosis:Same   Procedure:   Transcatheter Aortic Valve Replacement - PercutaneousRightTransfemoral Approach Edwards Sapien 3 THV (size 13mm,  model # [id number], serial # [id number])  Co-Surgeons:Clarence H. Roxy Manns, MD and Sherren Mocha, MD  Anesthesiologist:William Therisa Doyne, MD  Echocardiographer:Mihai Croitoru, MD  Pre-operative Echo Findings: ? Severe aortic stenosis ? Moderateleft ventricular systolic dysfunction  Post-operative Echo Findings: ? Noparavalvular leak ? Unchangedleft ventricular systolic function  ______________   Echo 03/20/18: IMPRESSIONS  1. The left ventricle has normal systolic function, with an ejection fraction of 55-60%. The cavity size was normal. There is moderately increased left ventricular wall thickness. Left ventricular diastology could not be evaluated secondary to atrial  fibrillation.  2. The right ventricle has normal systolic function. The cavity was mildly enlarged. There is no increase in right ventricular wall thickness.  3. Left atrial size was moderately dilated.  4. Right atrial size was mildly dilated.  5. The mitral valve is normal in structure. There is mild thickening. There is mild mitral annular calcification present.  6. The tricuspid  valve is normal in structure.  7. S/p TAVR. There is no aortic insufficiency.  8.  9. The pulmonic valve was normal in structure. 10. Right atrial pressure is estimated at 10 mmHg. 11. The interatrial septum was not assessed.   Patient Profile     [name] is a 85 y.o. male with a history of CAD, persistent afib on Coumadin, recurrent DVT/PE s/p IVC filter, HTN, chronic combined S/D CHF, prostate CA s/p radical prostatectomy, chronic urinary incontinence, CAD and severe AS who [facility] for planned PCI followed by TAVR.   Assessment & Plan    Severe AS:s/p successful TAVR with a 29 mm Edwards Sapien 3 THV via the TF approach on 03/19/18. Post operative echo showed EF 55%, normally functioning TAVR valve with a mean gradient of 71mm Hg and no  PVL.  ECG with paced rhythm. Continue Asprin and plavix  Hematoma: had failure of perclose device on right side but groinsitesappearstable. Hg dropped 15.8--> 9.5. Groin exam shows stable, soft hematoma. He has significant tenderness in RLQ above inguinal ligament. Will plan to do a non contrast CT to further evaluate  Acute blood loss anemia: Hg 15.8--> 9.5. Related to retroperitoneal bleed, phlebotomy and hemodilution with IV fluids. No transfusion indicated. Continue to monitor.   AKI with baseline CKD stage III: creat 1.47--> 2.11. Likely mutlifactorial 2/2 pre renal azotemia from hypotension, contrast dye exposure x2 (cath and TAVR) and volume depletion. He was treated with 500cc IV fluid bolus yesterday. He is not on any nephrotoxic agents. Continue to monitor at this time. Will start gentle hydration.   CAD: s/p successful PCI of severe stenosis throughout the proximal and mid RCA using overlapping drug-eluting stents. Continue DAPT with Aspirin and Plavix  Hypotension: resolved. Now off pressors.   Recurrent DVT/PE: he has an IVC filter in place. Coumadin currently on hold  Chronic afib: coumadin on hold given TAVR  S/p PPM: followed by Dr. Lovena Le   Signed, Angelena Form, PA-C  03/21/2018, 8:24 AM  Pager [phone]  I have seen and examined the patient and agree with the assessment and plan as outlined.  Will keep in hospital 1 more day to monitor renal function.  Rexene Alberts, MD 03/21/2018 12:45 PM

## 2018-03-25 NOTE — Telephone Encounter (Signed)
  [place] VALVE TEAM   Patient contacted regarding discharge from [facility] on 2/14  Patient understands to follow up with provider Nell Range on 2/19 @ 12:30pm at [place].  Patient understands discharge instructions? yes Patient understands medications and regiment? yes Patient understands to bring all medications to this visit? yes  Still has hematoma that is enlarging over his buttox and hip. Still some mild tenderness in RLQ that is improving.   Angelena Form PA-C  [facility]

## 2018-03-26 ENCOUNTER — Encounter: Payer: Self-pay | Admitting: Physician Assistant

## 2018-03-26 NOTE — Progress Notes (Signed)
[facility] [place]                                       Cardiology Office Note    Date:  03/28/2018   ID:  [name], DOB 08-02-1931, MRN [id number]  PCP:  Shirline Frees, MD  Cardiologist: Dr. Bettina Gavia / Dr. Burt Knack &Dr. Roxy Manns (TAVR)  CC: [facility] s/p TAVR   History of Present Illness:  [name] is a 85 y.o. male with a history of CAD, persistent afib on Coumadin,recurrent DVT/PE s/p IVC filter, HTN, chronic combined S/D CHF, prostate CA s/p radical prostatectomy, chronic urinary incontinence, CAD s/p PCI (03/18/18) and severe AS s/p TAVR (03/19/18) who presents to clinic for follow up.   Patient's cardiac history dates back to 2011 when he presented with an acute inferior wall myocardial infarction. He was found to have multivessel coronary artery disease and treated with PCI and stenting of the distal left circumflex coronary artery. He has been followed regularly ever since by Dr. Wynonia Lawman until recently when he has been followed by Dr. Bettina Gavia. He was noted to have a heart murmur and echocardiograms have documented the presence of aortic stenosis that has progressed in severity over time. He had bilateral pulmonary emboli in 2013. He underwent placement of Greenfield filter and has been anticoagulated using warfarin off and on ever since. 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 In 2016 he developed paroxysmal atrial fibrillation. He later developed progressive AV block with intermittent complete heart block and ultimately underwent permanent pacemaker implantation by Dr. Lovena Le in February 2019. Most recent follow-up transthoracic echocardiogram performed November 28, 2017 revealed moderate left ventricular systolic dysfunction with ejection fraction estimated 40 to 45%. By report there was "moderate aortic stenosis" based upon peak velocity across the aortic valve  measuring 3.6 m/s corresponding to mean transvalvular gradient estimated 29 mmHg. The DVI was reported 0.22 and the aortic valve area estimated 0.92 cm.The patient was recently seen in follow-up by Dr. Bettina Gavia who felt the patient's echocardiogram was suggestive of the presence of low gradient, low ejection fraction severe aortic stenosis. The patient was referred to the multidisciplinary heart valve clinic and underwent diagnostic cardiac catheterization by Dr. Burt Knack on February 28, 2018. The patient was found to have severe multivessel coronary artery disease including high-grade stenosis of the distal left circumflex coronary artery in the right coronary artery.   He underwent successfulPCI of severe stenosis throughout the proximal and mid RCA using overlapping drug-eluting stents on 2/10 followed by successful TAVR with a74mm Edwards Sapien 3 THV via the TF approach on 03/19/18. Post operative echoshowed EF 55%, normally functioning TAVR valve with a mean gradient of 12mm Hg and no PVL. He had failure of perclose device on right side. Hg dropped 15.8-->9.1. Non contrast CT 2/13 showed a right retroperitoneal hematoma 6.8 x 8.4 x 16.5 cm. Coumadin was held and he was discharged home on ASA and plavix.  Hospital course was also complicated by post operative hypotension and AKI (creat up to 2.11) and lasix was held.   Today he presents to clinic for follow up. He has been doing quite well outside of his groin and back. He still has quite a large hematoma with extension into his right flank. It is very tender to the touch. Otherwise, he is breathing well  and has had some improvement in his energy as well. No chest pain or SOB. No dizziness or syncope. No blood in stool or urine.     Past Medical History:  Diagnosis Date  . Arthritis    "knees, right shoulder" (03/14/2017)  . Atrial fibrillation, persistent 09/19/2014  . CAD (coronary artery disease), native coronary artery    Cath 2011 LHC  (08/2009):~ Proximal LAD 30%, mid to distal LAD 25%, ostial small D1 75% mid AV groove circumflex 99% been subtotal stenosis, proximal to mid RCA 25-30%, mid RCA 30%, mid PDA 30%, EF 50% with inferior hypokinesis.;    July 2011  PCI and DES to circumflex Dr. Olevia Perches   ETT-Myoview (06/2013):  Inferolateral scar, mild peri-infarct ischemia, EF 40%; Intermediate Risk   ECHO EF 55% 03/2013   . Cardiac pacemaker in situ 03/14/2017   Biventricular St. Jude inserted 03/14/17 Dr. Lovena Le for second degree heart block   . CKD (chronic kidney disease), stage III ([place])   . GERD   . History of infection of prosthetic knee 04/05/2015   Treated with debridement and irrigation followed by 6 months of triple antibiotics  . History of kidney stones   . History of peptic ulcer 1970s?  Marland Kitchen History of prostate cancer    Radical prostatectomy in 2006 Dr. Terance Hart   . Hypertensive heart disease   . Long term (current) use of anticoagulants 10/06/2011  . Mixed hyperlipidemia   . Moderate persistent asthma without complication 7/82/9562  . Obesity (BMI 30-39.9)   . OSA on CPAP   . 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)    Initial DVT in 2006 after prostate surgery and had Greenfield filter placed Bilateral  PE 2013 and placed back on warfarin DVT of right subclavian vein on doppler 10/2012 at time of knee infection   . Personal history of malignant neoplasm of prostate    Radical prostatectomy in 2006 Dr. Terance Hart    . Severe aortic stenosis   . Urinary incontinence    MULTIPLE BLADDER SURGERIES - STATES NO URINARY SPHINCTER - PT'S UROLOGIST IS AT [facility]- DR. PETERSON  ( LAST VISIT WAS 09/15/11 )    Past Surgical History:  Procedure Laterality Date  . APPENDECTOMY    . BI-VENTRICULAR PACEMAKER INSERTION (CRT-P)  03/14/2017  . BIV PACEMAKER INSERTION CRT-P N/A 03/14/2017   Procedure: BIV PACEMAKER INSERTION CRT-P;  Surgeon: Evans Lance, MD;  Location: [place] [facility];  Service:  Cardiovascular;  Laterality: N/A;  . CARDIOVERSION N/A 10/08/2014   Procedure: CARDIOVERSION;  Surgeon: Jacolyn Reedy, MD;  Location: [facility] ENDOSCOPY;  Service: Cardiovascular;  Laterality: N/A;  . CATARACT EXTRACTION W/ INTRAOCULAR LENS  IMPLANT, BILATERAL Bilateral   . CORONARY ANGIOPLASTY WITH STENT PLACEMENT  08/25/2009   DES-mid LCx 08/2009; 30% pLAD, 25% m/dLAD, 75% ostial D1, 99% mLCx s/p DES, 25-30% p/mRCA, 40% mRCA, 30% mPDA stenoses; LVEF 50%, inf hypokinesis  . CORONARY STENT INTERVENTION N/A 03/18/2018   Procedure: CORONARY STENT INTERVENTION;  Surgeon: Sherren Mocha, MD;  Location: [place] CV LAB;  Service: Cardiovascular;  Laterality: N/A;  . EXCISIONAL HEMORRHOIDECTOMY    . I&D KNEE WITH POLY EXCHANGE Left 10/09/2012   Procedure: IRRIGATION AND DEBRIDEMENT LEFT KNEE WITH POLY REVISION;  Surgeon: Gearlean Alf, MD;  Location: [facility] ORS;  Service: Orthopedics;  Laterality: Left;  . INGUINAL HERNIA REPAIR Right   . INTRAOPERATIVE TRANSTHORACIC ECHOCARDIOGRAM  03/19/2018   Procedure: Intraoperative Transthoracic Echocardiogram;  Surgeon: Sherren Mocha,  MD;  Location: [facility] OR;  Service: Open Heart Surgery;;  . JOINT REPLACEMENT    . KNEE ARTHROTOMY Left 10/09/2012   Procedure: LEFT KNEE ARTHROTOMY;  Surgeon: Gearlean Alf, MD;  Location: [facility] ORS;  Service: Orthopedics;  Laterality: Left;  . LEFT HEART CATH AND CORONARY ANGIOGRAPHY N/A 02/28/2018   Procedure: LEFT HEART CATH AND CORONARY ANGIOGRAPHY;  Surgeon: Sherren Mocha, MD;  Location: [place] CV LAB;  Service: Cardiovascular;  Laterality: N/A;  . PROSTATECTOMY  04/25/2004  . REPLACEMENT TOTAL KNEE Bilateral   . SKIN BIOPSY     "off nose; wasn't cancer; it was tested" (03/14/2017)  . TRANSCATHETER AORTIC VALVE REPLACEMENT, TRANSFEMORAL N/A 03/19/2018   Procedure: TRANSCATHETER AORTIC VALVE REPLACEMENT, TRANSFEMORAL;  Surgeon: Sherren Mocha, MD;  Location: [place];  Service: Open Heart Surgery;  Laterality: N/A;  . Uretheral  implants     multiple for incontinence  . VENA CAVA FILTER PLACEMENT      Current Medications: Outpatient Medications Prior to Visit  Medication Sig Dispense Refill  . amLODipine (NORVASC) 2.5 MG tablet Take 2.5 mg by mouth every evening.     . Ascorbic Acid (VITAMIN C) 1000 MG tablet Take 1,000 mg by mouth daily.    Marland Kitchen aspirin EC 81 MG tablet Take 1 tablet (81 mg total) by mouth daily. 90 tablet 3  . Biotin 2500 MCG CAPS Take 2,500 mcg by mouth daily.    . cholecalciferol (VITAMIN D) 1000 UNITS tablet Take 1,000 Units by mouth daily.    . clopidogrel (PLAVIX) 75 MG tablet Take 1 tablet (75 mg total) by mouth daily. 90 tablet 1  . DiphenhydrAMINE HCl, Sleep, (UNISOM SLEEPGELS) 50 MG CAPS Take 50 mg by mouth at bedtime.    . docusate sodium (COLACE) 50 MG capsule Take 50 mg by mouth daily.    . fenofibrate 160 MG tablet Take 1 tablet (160 mg total) by mouth daily. 30 tablet 5  . Glucosamine HCl (GLUCOSAMINE PO) Take 2,000 mg by mouth daily.    . hydroxypropyl methylcellulose / hypromellose (ISOPTO TEARS / GONIOVISC) 2.5 % ophthalmic solution Place 1 drop into both eyes 3 (three) times daily as needed for dry eyes.    . Multiple Vitamins-Minerals (ADULT GUMMY PO) Take 1 tablet by mouth daily.    . nitroGLYCERIN (NITROSTAT) 0.4 MG SL tablet Place 1 tablet (0.4 mg total) under the tongue every 5 (five) minutes as needed. 25 tablet 2  . Omega-3 Fatty Acids (FISH OIL) 1000 MG CAPS Take 1,000 mg by mouth daily.    . pantoprazole (PROTONIX) 40 MG tablet Take 1 tablet (40 mg total) by mouth daily. 90 tablet 3  . pravastatin (PRAVACHOL) 20 MG tablet TAKE 1 TABLET BY MOUTH EVERY DAY 30 tablet 7  . vitamin B-12 (CYANOCOBALAMIN) 500 MCG tablet Take 500 mcg by mouth daily.     No facility-administered medications prior to visit.      Allergies:   Darifenacin and Lisinopril   Social History   Socioeconomic History  . Marital status: Married    Spouse name: Not on file  . Number of children: 3  .  Years of education: Not on file  . Highest education level: Not on file  Occupational History  . Occupation: Retired Therapist, nutritional  Social Needs  . Financial resource strain: Not on file  . Food insecurity:    Worry: Not on file    Inability: Not on file  . Transportation needs:    Medical: Not on file  Non-medical: Not on file  Tobacco Use  . Smoking status: Former Smoker    Packs/day: 1.00    Years: 10.00    Pack years: 10.00    Types: Cigarettes    Last attempt to quit: 04/27/1961    Years since quitting: 56.9  . Smokeless tobacco: Never Used  Substance and Sexual Activity  . Alcohol use: No    Alcohol/week: 0.0 standard drinks  . Drug use: No  . Sexual activity: Not on file  Lifestyle  . Physical activity:    Days per week: Not on file    Minutes per session: Not on file  . Stress: Not on file  Relationships  . Social connections:    Talks on phone: Not on file    Gets together: Not on file    Attends religious service: Not on file    Active member of club or organization: Not on file    Attends meetings of clubs or organizations: Not on file    Relationship status: Not on file  Other Topics Concern  . Not on file  Social History Narrative  . Not on file     Family History:  The patient's family history includes Melanoma in his brother and father.      ROS:   Please see the history of present illness.    ROS All other systems reviewed and are negative.   PHYSICAL EXAM:   VS:  BP 140/68   Pulse 63   Ht 6' 0.12" (1.832 m)   Wt 237 lb (107.5 kg)   SpO2 99%   BMI 32.04 kg/m    GEN: Well nourished, well developed, in no acute distress, obese HEENT: normal Neck: no JVD or masses Cardiac: irreg irreg, soft flow murmur.No rubs, or gallops,no edema  Respiratory:  clear to auscultation bilaterally, normal work of breathing GI: soft, nontender, nondistended, + BS MS: no deformity or atrophy Skin: warm and dry, no rash. Right groin with extensive firm,  hematoma with ecchymosis into pubic bone and into genitals. Also Right flank with firm, tender hematoma Neuro:  Alert and Oriented x 3, Strength and sensation are intact Psych: euthymic mood, full affect   Wt Readings from Last 3 Encounters:  03/27/18 237 lb (107.5 kg)  03/22/18 241 lb 6.4 oz (109.5 kg)  03/14/18 234 lb (106.1 kg)      Studies/Labs Reviewed:   EKG:  EKG is NOT ordered today.    Recent Labs: 03/20/2018: Magnesium 1.7 03/22/2018: BUN 28; Creatinine, Ser 1.71; Hemoglobin 9.1; Platelets 130; Potassium 4.3; Sodium 139   Lipid Panel    Component Value Date/Time   CHOL 118 04/28/2011 1003   TRIG 85.0 04/28/2011 1003   HDL 34.90 (L) 04/28/2011 1003   CHOLHDL 3 04/28/2011 1003   VLDL 17.0 04/28/2011 1003   LDLCALC 66 04/28/2011 1003    Additional studies/ records that were reviewed today include:    03/18/18 CORONARY STENT INTERVENTION  Conclusion    Ost 3rd Mrg lesion is 100% stenosed.  Ost 2nd Mrg to 2nd Mrg lesion is 80% stenosed.  Prox Cx to Mid Cx lesion is 20% stenosed.  Prox RCA to Mid RCA lesion is 50% stenosed.  Mid RCA lesion is 75% stenosed.  Dist RCA lesion is 50% stenosed.  A drug-eluting stent was successfully placed using a STENT SYNERGY DES 2.5X32.  Post intervention, there is a 0% residual stenosis.  Post intervention, there is a 0% residual stenosis.  Successful PCI of severe stenosis throughout  the proximal and mid RCA using overlapping drug-eluting stents  Plan: Admit for hydration overnight with plans for TAVR tomorrow   ________________   TAVR OPERATIVE NOTE Date of Procedure:03/19/2018  Preoperative Diagnosis:Severe Aortic Stenosis   Postoperative Diagnosis:Same   Procedure:   Transcatheter Aortic Valve Replacement - PercutaneousRightTransfemoral Approach Edwards Sapien 3 THV (size 72mm, model # 9600TFX, serial #  [id number])  Co-Surgeons:Clarence H. Roxy Manns, MD and Sherren Mocha, MD  Anesthesiologist:William Therisa Doyne, MD  Echocardiographer:Mihai Croitoru, MD  Pre-operative Echo Findings: ? Severe aortic stenosis ? Moderateleft ventricular systolic dysfunction  Post-operative Echo Findings: ? Noparavalvular leak ? Unchangedleft ventricular systolic function  ______________   Echo 03/20/18: IMPRESSIONS 1. The left ventricle has normal systolic function, with an ejection fraction of 55-60%. The cavity size was normal. There is moderately increased left ventricular wall thickness. Left ventricular diastology could not be evaluated secondary to atrial  fibrillation. 2. The right ventricle has normal systolic function. The cavity was mildly enlarged. There is no increase in right ventricular wall thickness. 3. Left atrial size was moderately dilated. 4. Right atrial size was mildly dilated. 5. The mitral valve is normal in structure. There is mild thickening. There is mild mitral annular calcification present. 6. The tricuspid valve is normal in structure. 7. S/p TAVR. There is no aortic insufficiency. 8. 9. The pulmonic valve was normal in structure. 10. Right atrial pressure is estimated at 10 mmHg. 11. The interatrial septum was not assessed.  _____________   CT abdomen 03/21/18  IMPRESSION: 1. Right retroperitoneal hematoma extends from the groin region up to the lower pole the right kidney. 2. Right renal stones with potential obstructed calyx in the upper pole right kidney. 3. Tiny right pleural effusion with trace perihepatic ascites. 4. Aortic Atherosclerois (ICD10-170.0) 5. Previous prostatectomy.  CT ABD PELVIS 03/27/18 IMPRESSION: 1. No change to very slight increase in size of right-sided retroperitoneal hematoma without definitive intraperitoneal extension. 2. Cholelithiasis without evidence  of cholecystitis. 3. Nonobstructing right-sided nephrolithiasis. 4.  Aortic Atherosclerosis (ICD10-I70.0).   ASSESSMENT & PLAN:   Severe AS s/p TAVR:doing well after TAVR except for his RP hematoma. He is otherwise getting around okay with the help of his wife and generally feeling better. He will continue on Aspirin and plavix. ECG not ordered today as he has a PPM. SBE prophylaxis discussed. I will see him back 3/18 for follow up with echo.   CAD: s/p successfulPCI of severe stenosis throughout the proximal and mid RCA using overlapping drug-eluting stents. Continue DAPT with Aspirin and Plavix. When we eventually resume coumadin we will plan to drop aspirin. He will continue on Coumadin and plavix x 6 month and then drop the plavix and resume a baby aspirin.   Retroperitoneal hematoma: non contrast CT 2/13 showed a right retroperitoneal hematoma 6.8 x 8.4 x 16.5 cm.  Exam today shows extensive, firm hematoma over RLQ of his abdomen and right flank. To me, his hematoma looks a little worse than when discharged from the hosptial. Fortunately, he is hemodynamically stable. I ordered a CT scan in our office which showed a no change or very slight increase in RP hematoma. He will remain on ASA and plavix.  I went over continued restrictions with him and his wife. Discussed findings with Dr. Burt Knack today. We will not resume coumadin for at least a month. We did discuss how these can take weeks to heal.  Acute blood loss anemia: Hg down 15.8-->9.1. Check CBC today  AKI with baselineCKD stage III: creat  up to2.11 during admission. Treated with IV fluids and lasix held. Creat 1.7 at discharge. BMET today  Recurrent DVT/PE: he has an IVC filter in place. Coumadin currently on hold until feel comfortable adding back with RP bleed  Chronic afib/flutter: rate well controlled. Hold coumadin as above  S/p PPM: followed by Dr. Lovena Le   Total time spent with patient was over 40 minutes which  included evaluating patient, reviewing record and coordinating care. Face to face time >50%.    Medication Adjustments/Labs and Tests Ordered: Current medicines are reviewed at length with the patient today.  Concerns regarding medicines are outlined above.  Medication changes, Labs and Tests ordered today are listed in the Patient Instructions below. Patient Instructions  Medication Instructions:  Your physician recommends that you continue on your current medications as directed. Please refer to the Current Medication list given to you today.  If you need a refill on your cardiac medications before your next appointment, please call your pharmacy.   Lab work: Lab work to be done today--BMET and CBC If you have labs (blood work) drawn today and your tests are completely normal, you will receive your results only by: Marland Kitchen MyChart Message (if you have MyChart) OR . A paper copy in the mail If you have any lab test that is abnormal or we need to change your treatment, we will call you to review the results.  Testing/Procedures: Non-Cardiac CT scanning, (CAT scanning), is a noninvasive, special x-ray that produces cross-sectional images of the body using x-rays and a computer. CT scans help physicians diagnose and treat medical conditions. For some CT exams, a contrast material is used to enhance visibility in the area of the body being studied. CT scans provide greater clarity and reveal more details than regular x-ray exams. To be done today  Your physician has requested that you have an echocardiogram. Echocardiography is a painless test that uses sound waves to create images of your heart. It provides your doctor with information about the size and shape of your heart and how well your heart's chambers and valves are working. This procedure takes approximately one hour. There are no restrictions for this procedure. Scheduled for March 18,2020   Follow-Up: Follow up with K. Grandville Silos, [place] as  planned on March 18,2020     Signed, Angelena Form, PA-C  03/28/2018 3:20 AM    [address], [place], [place]  [address] Phone: [phone]; Fax: [phone]

## 2018-03-27 ENCOUNTER — Ambulatory Visit (INDEPENDENT_AMBULATORY_CARE_PROVIDER_SITE_OTHER): Payer: Medicare Other | Admitting: Physician Assistant

## 2018-03-27 ENCOUNTER — Other Ambulatory Visit: Payer: Self-pay | Admitting: Physician Assistant

## 2018-03-27 ENCOUNTER — Encounter: Payer: Self-pay | Admitting: *Deleted

## 2018-03-27 ENCOUNTER — Encounter: Payer: Self-pay | Admitting: Physician Assistant

## 2018-03-27 ENCOUNTER — Ambulatory Visit (INDEPENDENT_AMBULATORY_CARE_PROVIDER_SITE_OTHER)
Admission: RE | Admit: 2018-03-27 | Discharge: 2018-03-27 | Disposition: A | Discharge status: Home / Self Care | Payer: Medicare Other | Source: Ambulatory Visit | Attending: Physician Assistant | Admitting: Physician Assistant

## 2018-03-27 VITALS — BP 140/68 | HR 63 | Ht 72.12 in | Wt 237.0 lb

## 2018-03-27 DIAGNOSIS — I482 Chronic atrial fibrillation, unspecified: Secondary | ICD-10-CM | POA: Diagnosis not present

## 2018-03-27 DIAGNOSIS — Z952 Presence of prosthetic heart valve: Secondary | ICD-10-CM | POA: Diagnosis not present

## 2018-03-27 DIAGNOSIS — I255 Ischemic cardiomyopathy: Secondary | ICD-10-CM | POA: Diagnosis not present

## 2018-03-27 DIAGNOSIS — I2510 Atherosclerotic heart disease of native coronary artery without angina pectoris: Secondary | ICD-10-CM | POA: Diagnosis not present

## 2018-03-27 DIAGNOSIS — K661 Hemoperitoneum: Secondary | ICD-10-CM

## 2018-03-27 DIAGNOSIS — Z9861 Coronary angioplasty status: Secondary | ICD-10-CM

## 2018-03-27 DIAGNOSIS — Z950 Presence of cardiac pacemaker: Secondary | ICD-10-CM | POA: Diagnosis not present

## 2018-03-27 DIAGNOSIS — Z86711 Personal history of pulmonary embolism: Secondary | ICD-10-CM | POA: Diagnosis not present

## 2018-03-27 DIAGNOSIS — N200 Calculus of kidney: Secondary | ICD-10-CM | POA: Diagnosis not present

## 2018-03-27 DIAGNOSIS — N179 Acute kidney failure, unspecified: Secondary | ICD-10-CM

## 2018-03-27 DIAGNOSIS — R1030 Lower abdominal pain, unspecified: Secondary | ICD-10-CM | POA: Diagnosis not present

## 2018-03-27 DIAGNOSIS — D62 Acute posthemorrhagic anemia: Secondary | ICD-10-CM

## 2018-03-27 NOTE — Patient Instructions (Signed)
Medication Instructions:  Your physician recommends that you continue on your current medications as directed. Please refer to the Current Medication list given to you today.  If you need a refill on your cardiac medications before your next appointment, please call your pharmacy.   Lab work: Lab work to be done today--BMET and CBC If you have labs (blood work) drawn today and your tests are completely normal, you will receive your results only by: Marland Kitchen MyChart Message (if you have MyChart) OR . A paper copy in the mail If you have any lab test that is abnormal or we need to change your treatment, we will call you to review the results.  Testing/Procedures: Non-Cardiac CT scanning, (CAT scanning), is a noninvasive, special x-ray that produces cross-sectional images of the body using x-rays and a computer. CT scans help physicians diagnose and treat medical conditions. For some CT exams, a contrast material is used to enhance visibility in the area of the body being studied. CT scans provide greater clarity and reveal more details than regular x-ray exams. To be done today  Your physician has requested that you have an echocardiogram. Echocardiography is a painless test that uses sound waves to create images of your heart. It provides your doctor with information about the size and shape of your heart and how well your heart's chambers and valves are working. This procedure takes approximately one hour. There are no restrictions for this procedure. Scheduled for March 18,2020   Follow-Up: Follow up with K. Grandville Silos, [place] as planned on March 18,2020

## 2018-03-28 ENCOUNTER — Other Ambulatory Visit: Payer: Self-pay | Admitting: Physician Assistant

## 2018-03-28 ENCOUNTER — Telehealth (HOSPITAL_COMMUNITY): Payer: Self-pay

## 2018-03-28 LAB — CBC
HEMATOCRIT: 32.4 % — AB (ref 37.5–51.0)
HEMOGLOBIN: 10.3 g/dL — AB (ref 13.0–17.7)
MCH: 27.7 pg (ref 26.6–33.0)
MCHC: 31.8 g/dL (ref 31.5–35.7)
MCV: 87 fL (ref 79–97)
Platelets: 233 10*3/uL (ref 150–450)
RBC: 3.72 x10E6/uL — AB (ref 4.14–5.80)
RDW: 14.8 % (ref 11.6–15.4)
WBC: 9 10*3/uL (ref 3.4–10.8)

## 2018-03-28 LAB — BASIC METABOLIC PANEL
BUN/Creatinine Ratio: 18 (ref 10–24)
BUN: 30 mg/dL — AB (ref 8–27)
CO2: 19 mmol/L — ABNORMAL LOW (ref 20–29)
Calcium: 10 mg/dL (ref 8.6–10.2)
Chloride: 106 mmol/L (ref 96–106)
Creatinine, Ser: 1.64 mg/dL — ABNORMAL HIGH (ref 0.76–1.27)
GFR calc Af Amer: 43 mL/min/{1.73_m2} — ABNORMAL LOW (ref >59)
GFR calc non Af Amer: 38 mL/min/{1.73_m2} — ABNORMAL LOW (ref >59)
Glucose: 127 mg/dL — ABNORMAL HIGH (ref 65–99)
Potassium: 4.9 mmol/L (ref 3.5–5.2)
Sodium: 141 mmol/L (ref 134–144)

## 2018-03-28 MED ORDER — CLOPIDOGREL BISULFATE 75 MG PO TABS: 75.0000 mg | ORAL_TABLET | Freq: Every day | ORAL | 1 refills | Status: DC

## 2018-03-28 NOTE — Telephone Encounter (Signed)
Called patient to see if he is interested in the Cardiac Rehab Program. Patient expressed interest. Explained scheduling process and went over insurance, patient verbalized understanding. Will contact patient for scheduling once f/u has been completed.  °

## 2018-03-28 NOTE — Telephone Encounter (Signed)
Pt insurance is active and benefits verified through Medicare A/B. Co-pay $0.00, DED $198.00/$198.00 met, out of pocket $0.00/$0.00 met, co-insurance 20%. No pre-authorization required. Passport, 03/28/2018 @ 2:51PM, REF# [id number]  2ndary insurance is active and benefits verified through [facility]. Co-pay $0.00, DED $350.00/$0.00 met, out of pocket $5,000.00/$39.73 met, co-insurance 15%. No pre-authorization required. Passport, 03/28/2018 @ 2:56PM, REF# [id number]  Will contact patient to see if he is interested in the Cardiac Rehab Program. If interested, patient will need to complete follow up appt. Once completed, patient will be contacted for scheduling upon review by the RN Navigator.

## 2018-04-01 ENCOUNTER — Encounter: Payer: Self-pay | Admitting: Thoracic Surgery (Cardiothoracic Vascular Surgery)

## 2018-04-08 NOTE — Telephone Encounter (Signed)
Called patient to see if he was interested in participating in the Cardiac Rehab Program. Patient stated not at this time, does not feel like his body is up to it.  Closed referral

## 2018-04-23 ENCOUNTER — Telehealth: Payer: Self-pay | Admitting: Cardiology

## 2018-04-23 NOTE — Telephone Encounter (Signed)
Patient called in regards to rescheduling 2D echo follow-up for TAVR.  Patient has an appointment for follow-up echo as well as follow-up with Nell Range, PA.  Due to coronavirus pandemic and patient's advanced age and comorbidities he is at high risk for significant illness if he contracted the virus.  Therefore we are canceling both his appoint with Nell Range as well as his 2D echo.  The patient says he is doing wonderful and has no complaints at this time.  He has been informed that we will call to reschedule a follow-up once the coronavirus situation has improved.

## 2018-04-23 NOTE — Telephone Encounter (Signed)
thx

## 2018-04-24 ENCOUNTER — Ambulatory Visit: Payer: Medicare Other | Admitting: Physician Assistant

## 2018-04-24 ENCOUNTER — Other Ambulatory Visit (HOSPITAL_COMMUNITY): Payer: Medicare Other

## 2018-04-24 ENCOUNTER — Other Ambulatory Visit: Payer: Self-pay | Admitting: Physician Assistant

## 2018-04-24 DIAGNOSIS — Z952 Presence of prosthetic heart valve: Secondary | ICD-10-CM

## 2018-04-24 NOTE — Telephone Encounter (Signed)
  [place] VALVE TEAM  Patient contacted about 1 month TAVR follow up. Given Covid-19 pandemic we have asked the patient to not come into the office for appointment to limit exposure. The patient consented to do consult over the phone. The patient, [name], and Nell Range PA-C were present during the telephone encounter.   Chief Complaint: 1 month s/p TAVR.   HPI [name] is a 85 y.o. male with a history of CAD, heart block s/p BiV PPM, persistent afib on Coumadin,recurrent DVT/PE s/p IVC filter, HTN, chronic combined S/D CHF, prostate CA s/p radical prostatectomy, chronic urinary incontinence, CAD s/p PCI (03/18/18) and severe AS s/p TAVR (9/93/57) complicated by RP bleed.   Patient is currently doing well with no new symptoms. No CP or SOB. No LE edema, orthopnea or PND. No dizziness or syncope. No blood in stool or urine. No palpitations. He has been walking when the weather permits. He has lost 13 lbs. He is so pleased with how he is feeling since having his TAVR. His groin has completely healed with no more pain or bruising.   K[facility]Q completed over the phone.   Assessment and Plan:  Patient has NYHA class I symptoms. The patient has been continued plavix. Coumadin was on hold with large retroperitoneal bleed. This has now completely resolved. He was a previous Dr. Wynonia Lawman patient but now followed by Dr. Bettina Gavia. The patient lives in [place] and wishes to follow with our coumadin clinic as [place]. I will reach out to our coumadin clinic to have him established and resumed on Coumadin. Plan to continue coumadin and plavix for 6 months after TAVR and PCI and then stop plavix and start a baby aspirin on 09/17/18.   One month echo rescheduled to 06/06/18. Apt with Dr. Bettina Gavia scheduled for 07/18/18. I will see him back for 1 year echo and follow up in 03/2019.   Total time spent on phone ~20 minutes.   Angelena Form PA-C  [facility]

## 2018-04-24 NOTE — Progress Notes (Signed)
Entered in error

## 2018-04-25 ENCOUNTER — Other Ambulatory Visit: Payer: Self-pay | Admitting: Physician Assistant

## 2018-04-25 ENCOUNTER — Encounter: Payer: Self-pay | Admitting: Physician Assistant

## 2018-04-25 MED ORDER — WARFARIN SODIUM 2.5 MG PO TABS: 2.5000 mg | ORAL_TABLET | Freq: Every day | ORAL | 3 refills | Status: DC

## 2018-04-25 NOTE — Telephone Encounter (Addendum)
LMOM for pt and provided him with [facility] call back # as he will be establishing in their clinic. Will plan to resume warfarin 2.5mg  daily and recheck INR in 1 week with lower INR goal of 2-2.5.

## 2018-04-25 NOTE — Telephone Encounter (Signed)
Sounds good. Thank you so much!

## 2018-04-25 NOTE — Addendum Note (Signed)
Addended by: Rockne Menghini on: 04/25/2018 11:17 AM   Modules accepted: Orders

## 2018-04-25 NOTE — Telephone Encounter (Signed)
Pt has previously been followed in our [facility] back in 2015 - this is the closest office to his home. I'll reach out to him and have him resume his prior therapeutic dosing. Given history of retroperitoneal bleed, would you like [place] to narrow his INR range to 2-2.5 instead of 2-3?

## 2018-04-25 NOTE — Telephone Encounter (Signed)
Spoke with patient.  Will restart warfarin 2.5 mg qd tonight.  Appointment set for Wednesday March 25 at [place] office.    Patient voiced understanding

## 2018-04-29 ENCOUNTER — Telehealth: Payer: Self-pay

## 2018-04-29 NOTE — Telephone Encounter (Signed)

## 2018-04-30 ENCOUNTER — Telehealth: Payer: Self-pay | Admitting: Pharmacist Clinician (PhC)/ Clinical Pharmacy Specialist

## 2018-04-30 NOTE — Telephone Encounter (Signed)
1. Do you currently have a fever? No  (yes = cancel and refer to pcp for e-visit) 2. Have you recently travelled on a cruise, internationally, or to [place], [place], [place], [place], [place], or [place], [place] [employer]) ? No (yes = cancel, stay home, monitor symptoms, and contact pcp or initiate e-visit if symptoms develop) 3. Have you been in contact with someone that is currently pending confirmation of Covid19 testing or has been confirmed to have the [place] virus?  No (yes = cancel, stay home, away from tested individual, monitor symptoms, and contact pcp or initiate e-visit if symptoms develop) 4. Are you currently experiencing fatigue or cough? Yes - chronic cough that has gone on for years (yes = pt should be prepared to have a mask placed at the time of their visit).  Pt. Advised that we are restricting visitors at this time and anyone present in the vehicle should meet the above criteria as well. Advised that visit will be at curbside for finger stick ONLY and will receive call with instructions. Pt also advised to please bring own pen for signature of arrival document.

## 2018-05-01 ENCOUNTER — Ambulatory Visit (INDEPENDENT_AMBULATORY_CARE_PROVIDER_SITE_OTHER): Payer: Medicare Other

## 2018-05-01 ENCOUNTER — Other Ambulatory Visit: Payer: Self-pay

## 2018-05-01 DIAGNOSIS — Z86718 Personal history of other venous thrombosis and embolism: Secondary | ICD-10-CM

## 2018-05-01 DIAGNOSIS — I4819 Other persistent atrial fibrillation: Secondary | ICD-10-CM | POA: Diagnosis not present

## 2018-05-01 LAB — POCT INR: INR: 1.5 — AB (ref 2.0–3.0)

## 2018-05-01 NOTE — Patient Instructions (Signed)
Description    Called spoke with pt and his wife, advised to take 1.5 tablets today, then resume same dosage 1 tablet daily. Recheck in 10 days. Call us with any medication changes or concerns. Coumadin Clinic # [phone] office.

## 2018-05-09 ENCOUNTER — Telehealth: Payer: Self-pay | Admitting: Pharmacist

## 2018-05-09 ENCOUNTER — Telehealth: Payer: Self-pay

## 2018-05-09 NOTE — Telephone Encounter (Signed)
lmom for prescreen/drive thru 

## 2018-05-09 NOTE — Telephone Encounter (Signed)
1. Do you currently have a fever? no (yes = cancel and refer to pcp for e-visit) 2. Have you recently travelled on a cruise, internationally, or to [place], [place], [place], [place], [place], or [place], [place] [employer]) ? NO (yes = cancel, stay home, monitor symptoms, and contact pcp or initiate e-visit if symptoms develop) 3. Have you been in contact with someone that is currently pending confirmation of Covid19 testing or has been confirmed to have the [place] virus?  NO(yes = cancel, stay home, away from tested individual, monitor symptoms, and contact pcp or initiate e-visit if symptoms develop) 4. Are you currently experiencing fatigue or cough? NO (yes = pt should be prepared to have a mask placed at the time of their visit).  Pt. Advised that we are restricting visitors at this time and anyone present in the vehicle should meet the above criteria as well. Advised that visit will be at curbside for finger stick ONLY and will receive call with instructions. Pt also advised to please bring own pen for signature of arrival document.

## 2018-05-10 ENCOUNTER — Encounter: Payer: Self-pay | Admitting: Thoracic Surgery (Cardiothoracic Vascular Surgery)

## 2018-05-13 ENCOUNTER — Telehealth: Payer: Self-pay

## 2018-05-13 ENCOUNTER — Other Ambulatory Visit: Payer: Self-pay

## 2018-05-13 ENCOUNTER — Ambulatory Visit (INDEPENDENT_AMBULATORY_CARE_PROVIDER_SITE_OTHER): Payer: Medicare Other | Admitting: Pharmacist

## 2018-05-13 DIAGNOSIS — Z86718 Personal history of other venous thrombosis and embolism: Secondary | ICD-10-CM | POA: Diagnosis not present

## 2018-05-13 DIAGNOSIS — I4819 Other persistent atrial fibrillation: Secondary | ICD-10-CM | POA: Diagnosis not present

## 2018-05-13 LAB — POCT INR: INR: 2.7 (ref 2.0–3.0)

## 2018-05-13 NOTE — Telephone Encounter (Signed)
New message   The patient left a message on the voice mail inquiring about his upcoming echocardiogram appt that scheduled on Thursday, April 9th. please advise if the patient needs to come or reschedule echo.

## 2018-05-13 NOTE — Telephone Encounter (Signed)
Pt aware K. Grandville Silos, PA-C will review and let him know in next day/two if echo on 4/9 is needed or can be rescheduled d/t current pandemic. Pt aware Grandville Silos will let him know plan.

## 2018-05-13 NOTE — Telephone Encounter (Signed)
His echo can be rescheduled. Could we do it before his appointment with Dr. Bettina Gavia in June? Would you mind helping me to arrange that and letting the patient know.

## 2018-05-14 ENCOUNTER — Telehealth: Payer: Self-pay | Admitting: Cardiology

## 2018-05-14 NOTE — Telephone Encounter (Signed)
Please reschedule his echocardiogram to end of May, he has called already. Jari Sportsman, can you please call him and let him know about the new date? Thank you! [place] Siren

## 2018-05-15 NOTE — Telephone Encounter (Signed)
Thanks [place] Siren! Yes this can be moved even further out. Jari Sportsman, can you call him to cancel and get his echo set up in [place] before his visit with Dr. Bettina Gavia? Thank you

## 2018-05-16 ENCOUNTER — Ambulatory Visit (HOSPITAL_COMMUNITY): Payer: Medicare Other | Attending: Cardiovascular Disease

## 2018-05-16 ENCOUNTER — Ambulatory Visit (HOSPITAL_COMMUNITY): Payer: Medicare Other

## 2018-05-16 ENCOUNTER — Other Ambulatory Visit: Payer: Self-pay

## 2018-05-16 DIAGNOSIS — Z952 Presence of prosthetic heart valve: Secondary | ICD-10-CM | POA: Diagnosis not present

## 2018-06-06 ENCOUNTER — Other Ambulatory Visit (HOSPITAL_COMMUNITY): Payer: Medicare Other

## 2018-06-18 ENCOUNTER — Other Ambulatory Visit: Payer: Self-pay

## 2018-06-18 ENCOUNTER — Ambulatory Visit (INDEPENDENT_AMBULATORY_CARE_PROVIDER_SITE_OTHER): Payer: Medicare Other | Admitting: *Deleted

## 2018-06-18 DIAGNOSIS — I4819 Other persistent atrial fibrillation: Secondary | ICD-10-CM

## 2018-06-18 DIAGNOSIS — I441 Atrioventricular block, second degree: Secondary | ICD-10-CM

## 2018-06-19 LAB — CUP PACEART REMOTE DEVICE CHECK
Date Time Interrogation Session: 20200513094840
Implantable Lead Implant Date: 20190206
Implantable Lead Implant Date: 20190206
Implantable Lead Implant Date: 20190206
Implantable Lead Location: 753858
Implantable Lead Location: 753859
Implantable Lead Location: 753860
Implantable Pulse Generator Implant Date: 20190206
Pulse Gen Model: 3262
Pulse Gen Serial Number: 8995812

## 2018-06-21 ENCOUNTER — Telehealth: Payer: Self-pay

## 2018-06-21 ENCOUNTER — Other Ambulatory Visit: Payer: Self-pay | Admitting: *Deleted

## 2018-06-21 ENCOUNTER — Telehealth: Payer: Self-pay | Admitting: Cardiology

## 2018-06-21 MED ORDER — FENOFIBRATE 160 MG PO TABS: 160.0000 mg | ORAL_TABLET | Freq: Every day | ORAL | 1 refills | Status: DC

## 2018-06-21 NOTE — Telephone Encounter (Signed)
Fenofibrate 160 mg refilled

## 2018-06-21 NOTE — Telephone Encounter (Signed)
°*  STAT* If patient is at the pharmacy, call can be transferred to refill team.   1. Which medications need to be refilled? (please list name of each medication and dose if known) fenofibrate 160 MG tablet   2. Which pharmacy/location (including street and city if local pharmacy) is medication to be sent to?  [id number] [address], [place] [phone] (Phone) [phone] (Fax)    3. Do they need a 30 day or 90 day supply? 90 day

## 2018-06-21 NOTE — Telephone Encounter (Signed)

## 2018-06-24 ENCOUNTER — Ambulatory Visit (INDEPENDENT_AMBULATORY_CARE_PROVIDER_SITE_OTHER): Payer: Medicare Other | Admitting: *Deleted

## 2018-06-24 ENCOUNTER — Other Ambulatory Visit: Payer: Self-pay

## 2018-06-24 DIAGNOSIS — I4819 Other persistent atrial fibrillation: Secondary | ICD-10-CM | POA: Diagnosis not present

## 2018-06-24 DIAGNOSIS — Z86718 Personal history of other venous thrombosis and embolism: Secondary | ICD-10-CM | POA: Diagnosis not present

## 2018-06-24 LAB — POCT INR: INR: 3.2 — AB (ref 2.0–3.0)

## 2018-07-03 NOTE — Progress Notes (Signed)
Remote pacemaker transmission.   

## 2018-07-11 ENCOUNTER — Telehealth: Payer: Self-pay | Admitting: Cardiology

## 2018-07-11 NOTE — Telephone Encounter (Signed)
Virtual Visit Pre-Appointment Phone Call  "(Name), I am calling you today to discuss your upcoming appointment. We are currently trying to limit exposure to the virus that causes COVID-19 by seeing patients at home rather than in the office."  1. "What is the BEST phone number to call the day of the visit?" - include this in appointment notes  2. Do you have or have access to (through a family member/friend) a smartphone with video capability that we can use for your visit?" a. If yes - list this number in appt notes as cell (if different from BEST phone #) and list the appointment type as a VIDEO visit in appointment notes b. If no - list the appointment type as a PHONE visit in appointment notes  Confirm consent - "In the setting of the current Covid19 crisis, you are scheduled for a (phone or video) visit with your provider on (date) at (time).  Just as we do with many in-office visits, in order for you to participate in this visit, we must obtain consent.  If you'd like, I can send this to your mychart (if signed up) or email for you to review.  Otherwise, I can obtain your verbal consent now.  All virtual visits are billed to your insurance company just like a normal visit would be.  By agreeing to a virtual visit, we'd like you to understand that the technology does not allow for your provider to perform an examination, and thus may limit your provider's ability to fully assess your condition. If your provider identifies any concerns that need to be evaluated in person, we will make arrangements to do so.  Finally, though the technology is pretty good, we cannot assure that it will always work on either your or our end, and in the setting of a video visit, we may have to convert it to a phone-only visit.  In either situation, we cannot ensure that we have a secure connection.  Are you willing to proceed?" STAFF: Did the patient verbally acknowledge consent to telehealth visit? Document  YES/NO here: YES 3. Advise patient to be prepared - "Two hours prior to your appointment, go ahead and check your blood pressure, pulse, oxygen saturation, and your weight (if you have the equipment to check those) and write them all down. When your visit starts, your provider will ask you for this information. If you have an Apple Watch or Kardia device, please plan to have heart rate information ready on the day of your appointment. Please have a pen and paper handy nearby the day of the visit as well."  4. Give patient instructions for MyChart download to smartphone OR Doximity/Doxy.me as below if video visit (depending on what platform provider is using)  5. Inform patient they will receive a phone call 15 minutes prior to their appointment time (may be from unknown caller ID) so they should be prepared to answer    TELEPHONE CALL NOTE  [place] has been deemed a candidate for a follow-up tele-health visit to limit community exposure during the Covid-19 pandemic. I spoke with the patient via phone to ensure availability of phone/video source, confirm preferred email & phone number, and discuss instructions and expectations.  I reminded Dailey P Mcmanamon to be prepared with any vital sign and/or heart rhythm information that could potentially be obtained via home monitoring, at the time of his visit. I reminded Kabeer P Seat to expect a phone call prior to his visit.  Frederic Jericho 07/11/2018 3:44 PM   INSTRUCTIONS FOR DOWNLOADING THE MYCHART APP TO SMARTPHONE  - The patient must first make sure to have activated MyChart and know their login information - If Apple, go to [employer] and type in MyChart in the search bar and download the app. If Android, ask patient to go to [employer] and type in [place] in the search bar and download the app. The app is free but as with any other app downloads, their phone may require them to verify saved payment information or  Apple/Android password.  - The patient will need to then log into the app with their MyChart username and password, and select [facility] as their healthcare provider to link the account. When it is time for your visit, go to the MyChart app, find appointments, and click Begin Video Visit. Be sure to Select Allow for your device to access the Microphone and Camera for your visit. You will then be connected, and your provider will be with you shortly.  **If they have any issues connecting, or need assistance please contact MyChart service desk [phone])**  **If using a computer, in order to ensure the best quality for their visit they will need to use either of the following Internet Browsers: [employer], or Google Chrome**  IF USING DOXIMITY or DOXY.ME - The patient will receive a link just prior to their visit by text.     FULL LENGTH CONSENT FOR TELE-HEALTH VISIT   I hereby voluntarily request, consent and authorize [facility] and its employed or contracted physicians, physician assistants, nurse practitioners or other licensed health care professionals (the Practitioner), to provide me with telemedicine health care services (the Services") as deemed necessary by the treating Practitioner. I acknowledge and consent to receive the Services by the Practitioner via telemedicine. I understand that the telemedicine visit will involve communicating with the Practitioner through live audiovisual communication technology and the disclosure of certain medical information by electronic transmission. I acknowledge that I have been given the opportunity to request an in-person assessment or other available alternative prior to the telemedicine visit and am voluntarily participating in the telemedicine visit.  I understand that I have the right to withhold or withdraw my consent to the use of telemedicine in the course of my care at any time, without affecting my right to future care  or treatment, and that the Practitioner or I may terminate the telemedicine visit at any time. I understand that I have the right to inspect all information obtained and/or recorded in the course of the telemedicine visit and may receive copies of available information for a reasonable fee.  I understand that some of the potential risks of receiving the Services via telemedicine include:   Delay or interruption in medical evaluation due to technological equipment failure or disruption;  Information transmitted may not be sufficient (e.g. poor resolution of images) to allow for appropriate medical decision making by the Practitioner; and/or   In rare instances, security protocols could fail, causing a breach of personal health information.  Furthermore, I acknowledge that it is my responsibility to provide information about my medical history, conditions and care that is complete and accurate to the best of my ability. I acknowledge that Practitioner's advice, recommendations, and/or decision may be based on factors not within their control, such as incomplete or inaccurate data provided by me or distortions of diagnostic images or specimens that may result from electronic transmissions. I understand that the  practice of medicine is not an Chief Strategy Officer and that Practitioner makes no warranties or guarantees regarding treatment outcomes. I acknowledge that I will receive a copy of this consent concurrently upon execution via email to the email address I last provided but may also request a printed copy by calling the office of [facility].    I understand that my insurance will be billed for this visit.   I have read or had this consent read to me.  I understand the contents of this consent, which adequately explains the benefits and risks of the Services being provided via telemedicine.   I have been provided ample opportunity to ask questions regarding this consent and the Services and have had  my questions answered to my satisfaction.  I give my informed consent for the services to be provided through the use of telemedicine in my medical care  By participating in this telemedicine visit I agree to the above.

## 2018-07-16 ENCOUNTER — Telehealth: Payer: Self-pay

## 2018-07-16 NOTE — Progress Notes (Addendum)
Virtual Visit via Video Note   This visit type was conducted due to national recommendations for restrictions regarding the COVID-19 Pandemic (e.g. social distancing) in an effort to limit this patient's exposure and mitigate transmission in our community.  Due to his co-morbid illnesses, this patient is at least at moderate risk for complications without adequate follow up.  This format is felt to be most appropriate for this patient at this time.  All issues noted in this document were discussed and addressed.  A limited physical exam was performed with this format.  Please refer to the patient's chart for his consent to telehealth for [facility].   Date:  07/17/2018   ID:  [name], DOB 11-28-1931, MRN [id number]  Patient Location: Home Provider Location: Office  PCP:  Shirline Frees, MD  Cardiologist:  Shirlee More, MD / Dr. Burt Knack (TAVR) Electrophysiologist:  None   Evaluation Performed:  Follow-Up Visit  Chief Complaint:  85 year old male presents for 3 months follow up of cardiac conditions including CAD, AS s/p TAVR, atrial fibrillation, chronic anticoagulation, PPM.   History of Present Illness:    [name] is a 85 y.o. male with PMH CAD s/p PCI with DES to RCA 03/18/18, severe AS s/p TAVR 03/19/18, persistent atrial fibrillation anticoagulated on Warfarin, recurrent DVT/PE s/p IVC filter, HTN, combined heart failure, PPM. Last seen by Angelena Form PA 03/27/18. Presents today for 3 months follow up.   He is doing well. Reports walking up to a mile without fatigue and is being cautious of warm temperatures. Denies chest pain, palpitations, fluttering. Reports intermittent ankle edema for which he will take a fluid pill approximately once per month with resolution. Checks weight daily, reports it has been staying 230-232lbs. Reports compliance with warfarin, denies bleeding complications, follows with [facility].   The patient does not have symptoms  concerning for COVID-19 infection (fever, chills, cough, or new shortness of breath). Of note his wife works at the court house and was exposed to the virus so they are both on a 14 day home quarantine.    Past Medical History:  Diagnosis Date  . Arthritis    "knees, right shoulder" (03/14/2017)  . Atrial fibrillation, persistent 09/19/2014  . CAD (coronary artery disease), native coronary artery    Cath 2011 LHC (08/2009):~ Proximal LAD 30%, mid to distal LAD 25%, ostial small D1 75% mid AV groove circumflex 99% been subtotal stenosis, proximal to mid RCA 25-30%, mid RCA 30%, mid PDA 30%, EF 50% with inferior hypokinesis.;    July 2011  PCI and DES to circumflex Dr. Olevia Perches   ETT-Myoview (06/2013):  Inferolateral scar, mild peri-infarct ischemia, EF 40%; Intermediate Risk   ECHO EF 55% 03/2013   . Cardiac pacemaker in situ 03/14/2017   Biventricular St[employer] Jude inserted 03/14/17 Dr. Lovena Le for second degree heart block   . CKD (chronic kidney disease), stage III ([place])   . GERD   . History of infection of prosthetic knee 04/05/2015   Treated with debridement and irrigation followed by 6 months of triple antibiotics  . History of kidney stones   . History of peptic ulcer 1970s?  Marland Kitchen History of prostate cancer    Radical prostatectomy in 2006 Dr. Terance Hart   . Hypertensive heart disease   . Long term (current) use of anticoagulants 10/06/2011  . Mixed hyperlipidemia   . Moderate persistent asthma without complication 05/15/8117  . Obesity (BMI 30-39.9)   . OSA on CPAP   .  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)    Initial DVT in 2006 after prostate surgery and had [place] filter placed Bilateral  PE 2013 and placed back on warfarin DVT of right subclavian vein on doppler 10/2012 at time of knee infection   . Personal history of malignant neoplasm of prostate    Radical prostatectomy in 2006 Dr. Terance Hart    . Severe aortic stenosis   . Severe aortic stenosis 03/18/2018  .  Urinary incontinence    MULTIPLE BLADDER SURGERIES - STATES NO URINARY SPHINCTER - PT'S UROLOGIST IS AT [facility]- DR. PETERSON  ( LAST VISIT WAS 09/15/11 )   Past Surgical History:  Procedure Laterality Date  . APPENDECTOMY    . BI-VENTRICULAR PACEMAKER INSERTION (CRT-P)  03/14/2017  . BIV PACEMAKER INSERTION CRT-P N/A 03/14/2017   Procedure: BIV PACEMAKER INSERTION CRT-P;  Surgeon: Evans Lance, MD;  Location: [place] [facility];  Service: Cardiovascular;  Laterality: N/A;  . CARDIOVERSION N/A 10/08/2014   Procedure: CARDIOVERSION;  Surgeon: Jacolyn Reedy, MD;  Location: [facility];  Service: Cardiovascular;  Laterality: N/A;  . CATARACT EXTRACTION W/ INTRAOCULAR LENS  IMPLANT, BILATERAL Bilateral   . CORONARY ANGIOPLASTY WITH STENT PLACEMENT  08/25/2009   DES-mid LCx 08/2009; 30% pLAD, 25% m/dLAD, 75% ostial D1, 99% mLCx s/p DES, 25-30% p/mRCA, 40% mRCA, 30% mPDA stenoses; LVEF 50%, inf hypokinesis  . CORONARY STENT INTERVENTION N/A 03/18/2018   Procedure: CORONARY STENT INTERVENTION;  Surgeon: Sherren Mocha, MD;  Location: [place] [facility];  Service: Cardiovascular;  Laterality: N/A;  . EXCISIONAL HEMORRHOIDECTOMY    . I&D KNEE WITH POLY EXCHANGE Left 10/09/2012   Procedure: IRRIGATION AND DEBRIDEMENT LEFT KNEE WITH POLY REVISION;  Surgeon: Gearlean Alf, MD;  Location: [facility];  Service: Orthopedics;  Laterality: Left;  . INGUINAL HERNIA REPAIR Right   . INTRAOPERATIVE TRANSTHORACIC ECHOCARDIOGRAM  03/19/2018   Procedure: Intraoperative Transthoracic Echocardiogram;  Surgeon: Sherren Mocha, MD;  Location: [place];  Service: Open Heart Surgery;;  . JOINT REPLACEMENT    . KNEE ARTHROTOMY Left 10/09/2012   Procedure: LEFT KNEE ARTHROTOMY;  Surgeon: Gearlean Alf, MD;  Location: [facility];  Service: Orthopedics;  Laterality: Left;  . LEFT HEART CATH AND CORONARY ANGIOGRAPHY N/A 02/28/2018   Procedure: LEFT HEART CATH AND CORONARY ANGIOGRAPHY;  Surgeon: Sherren Mocha, MD;  Location: [place]  CV LAB;  Service: Cardiovascular;  Laterality: N/A;  . PROSTATECTOMY  04/25/2004  . REPLACEMENT TOTAL KNEE Bilateral   . SKIN BIOPSY     "off nose; wasn't cancer; it was tested" (03/14/2017)  . TRANSCATHETER AORTIC VALVE REPLACEMENT, TRANSFEMORAL N/A 03/19/2018   Procedure: TRANSCATHETER AORTIC VALVE REPLACEMENT, TRANSFEMORAL;  Surgeon: Sherren Mocha, MD;  Location: [place];  Service: Open Heart Surgery;  Laterality: N/A;  . Uretheral implants     multiple for incontinence  . VENA CAVA FILTER PLACEMENT       Current Meds  Medication Sig  . amLODipine (NORVASC) 2.5 MG tablet Take 2.5 mg by mouth every evening.   . Ascorbic Acid (VITAMIN C) 1000 MG tablet Take 1,000 mg by mouth daily.  . Biotin 2500 MCG CAPS Take 2,500 mcg by mouth daily.  . cholecalciferol (VITAMIN D) 1000 UNITS tablet Take 1,000 Units by mouth daily.  . clopidogrel (PLAVIX) 75 MG tablet Take 1 tablet (75 mg total) by mouth daily.  . DiphenhydrAMINE HCl, Sleep, (UNISOM SLEEPGELS) 50 MG CAPS Take 50 mg by mouth at bedtime.  . docusate sodium (  COLACE) 50 MG capsule Take 50 mg by mouth daily.  . fenofibrate 160 MG tablet Take 1 tablet (160 mg total) by mouth daily.  . Glucosamine HCl (GLUCOSAMINE PO) Take 2,000 mg by mouth daily.  . Multiple Vitamins-Minerals (ADULT GUMMY PO) Take 1 tablet by mouth daily.  . nitroGLYCERIN (NITROSTAT) 0.4 MG SL tablet Place 1 tablet (0.4 mg total) under the tongue every 5 (five) minutes as needed.  . Omega-3 Fatty Acids (FISH OIL) 1000 MG CAPS Take 1,000 mg by mouth daily.  . pantoprazole (PROTONIX) 40 MG tablet Take 1 tablet (40 mg total) by mouth daily.  . pravastatin (PRAVACHOL) 20 MG tablet TAKE 1 TABLET BY MOUTH EVERY DAY  . vitamin B-12 (CYANOCOBALAMIN) 500 MCG tablet Take 500 mcg by mouth daily.  Marland Kitchen warfarin (COUMADIN) 2.5 MG tablet Take 1 tablet (2.5 mg total) by mouth daily.     Allergies:   Darifenacin and Lisinopril   Social History   Tobacco Use  . Smoking status: Former Smoker     Packs/day: 1.00    Years: 10.00    Pack years: 10.00    Types: Cigarettes    Last attempt to quit: 04/27/1961    Years since quitting: 57.2  . Smokeless tobacco: Never Used  Substance Use Topics  . Alcohol use: No    Alcohol/week: 0.0 standard drinks  . Drug use: No     Family Hx: The patient's family history includes Melanoma in his brother and father.  ROS:   Please see the history of present illness.    Review of Systems  Constitution: Negative for chills, fever and malaise/fatigue.  HENT: Positive for hearing loss (wears hearing aid).   Cardiovascular: Negative for chest pain, dyspnea on exertion, irregular heartbeat, leg swelling and palpitations.  Respiratory: Negative for cough, shortness of breath and wheezing.   Neurological: Negative for light-headedness, loss of balance and weakness.    All other systems reviewed and are negative.   Prior CV studies:   The following studies were reviewed today:   06/18/18 PPM remote device check:   Normal remote reviewed. Permanent AF 100% burden>1 year. RVR at times noted.   05/16/18 echocardiogram:   1. The left ventricle has normal systolic function, with an ejection fraction of 55-60%. The cavity size was normal. Left ventricular diastolic Doppler parameters are indeterminate.  2. The right ventricle has normal systolic function. The cavity was normal. There is no increase in right ventricular wall thickness.  3. Pacing wires seen in RA/RV.  4. Left atrial size was moderately dilated.  5. Right atrial size was moderately dilated.  6. Moderate thickening of the mitral valve leaflet. Mild calcification of the mitral valve leaflet. There is moderate mitral annular calcification present.  7. Post TAVR 29 mm Sapien 3 with no PVL and stable low gradients that are variable due to afib.  03/18/18 cardiac catheterization  Ost 3rd Mrg lesion is 100% stenosed.  Ost 2nd Mrg to 2nd Mrg lesion is 80% stenosed.  Prox Cx to Mid Cx  lesion is 20% stenosed.  Prox RCA to Mid RCA lesion is 50% stenosed.  Mid RCA lesion is 75% stenosed.  Dist RCA lesion is 50% stenosed.  A drug-eluting stent was successfully placed using a STENT SYNERGY DES 2.5X32.  Post intervention, there is a 0% residual stenosis.  Post intervention, there is a 0% residual stenosis.  Impression: Successful PCI of severe stenosis throughout the proximal and mid RCA using overlapping drug-eluting stents    Labs/Other  Tests and Data Reviewed:    EKG:  No ECG reviewed.  Recent Labs: 03/20/2018: Magnesium 1.7 03/27/2018: BUN 30; Creatinine, Ser 1.64; Hemoglobin 10.3; Platelets 233; Potassium 4.9; Sodium 141   Recent Lipid Panel Lab Results  Component Value Date/Time   CHOL 118 04/28/2011 10:03 AM   TRIG 85.0 04/28/2011 10:03 AM   HDL 34.90 (L) 04/28/2011 10:03 AM   CHOLHDL 3 04/28/2011 10:03 AM   LDLCALC 66 04/28/2011 10:03 AM    Wt Readings from Last 3 Encounters:  07/17/18 232 lb (105.2 kg)  03/27/18 237 lb (107.5 kg)  03/22/18 241 lb 6.4 oz (109.5 kg)     Objective:    Vital Signs:  BP 116/72   Pulse 60   Ht 6' (1.829 m)   Wt 232 lb (105.2 kg)   BMI 31.46 kg/m    VITAL SIGNS:  reviewed GEN:  no acute distress EYES:  sclerae anicteric, EOMI - Extraocular Movements Intact CARDIOVASCULAR:  no peripheral edema NEURO:  alert and oriented x 3, no obvious focal deficit PSYCH:  normal affect  ASSESSMENT & PLAN:    1. CAD s/p PCI and DES RCA 03/2018 - Stable, no anginal symptoms. NYHA class I-II. Encouraged him to continue walking daily and to try mornings or evenings when not so hot. GDMT: plavix, statin, CCB.   Continue Plavix and Coumadin 6 months post stent. Re-evaluate at office visit in 3 months.  2. Severe AS s/p TAVR 03/2018 - Reports feeling well. States his SOB and DOE have resolved. No dizziness, syncope. Case was complicated by retroperitoneal hematoma which has resolved and anticoagulation resumed.   Will need echo  for 1 year for follow up of TAVR.  3. Atrial fibrillation, chronic - Rate controlled, HR 60 today. Denies palpitations, flutter. Continue anticoagulation.  4. Combined systolic and diastolic heart failure - Reports no edema today. Takes diuretic approximately once per month for intermittent ankle edema. Does daily weights with 230-232lbs. Echo 03/2018 with EF 40-98% and diastolic function indeterminate due to atrial fibrillation. No need for additional diuresis.  ProBNP at [facility] in 3 months 5. Chronic anticoagulation - Secondary to atrial fibrillation, TAVR, recurrent DVT/PE s/p IVC filter. Denies bleeding complications including hematuria and melena.   Continue to follow in [facility]madin Clinic. 6. PPM - PMH AV block. PPM remote interrogation 06/18/18 reports atrial fibrillation 100% of the time >1 year.  7. HLD - Lipid panel 10/2017 with LDL 54, at goal. Continue pravastatin and fenofibrate.  Lipid panel at office visit in 3 months.  8. CKD 3 - Stable. Followed by PCP.  Repeat BMP in 3 months if no new results available from PCP.   COVID-19 Education: The signs and symptoms of COVID-19 were discussed with the patient and how to seek care for testing (follow up with PCP or arrange E-visit).  The importance of social distancing was discussed today.  Time:   Today, I have spent 25 minutes with the patient with telehealth technology discussing the above problems.  An additional 15 minutes was spent reviewing previous testing, office visits, hospitalization records.    Medication Adjustments/Labs and Tests Ordered: Current medicines are reviewed at length with the patient today.  Concerns regarding medicines are outlined above.   Tests Ordered: No orders of the defined types were placed in this encounter.   Medication Changes: No orders of the defined types were placed in this encounter.   Disposition:  Follow up in 3 month(s)  Signed, Shirlee More, MD  07/17/2018 11:53 AM  [facility]

## 2018-07-16 NOTE — Telephone Encounter (Signed)

## 2018-07-17 ENCOUNTER — Other Ambulatory Visit: Payer: Self-pay

## 2018-07-17 ENCOUNTER — Telehealth: Payer: Self-pay | Admitting: *Deleted

## 2018-07-17 ENCOUNTER — Encounter: Payer: Self-pay | Admitting: Cardiology

## 2018-07-17 ENCOUNTER — Telehealth (INDEPENDENT_AMBULATORY_CARE_PROVIDER_SITE_OTHER): Payer: Medicare Other | Admitting: Cardiology

## 2018-07-17 VITALS — BP 116/72 | HR 60 | Ht 72.0 in | Wt 232.0 lb

## 2018-07-17 DIAGNOSIS — Z952 Presence of prosthetic heart valve: Secondary | ICD-10-CM | POA: Diagnosis not present

## 2018-07-17 DIAGNOSIS — I5040 Unspecified combined systolic (congestive) and diastolic (congestive) heart failure: Secondary | ICD-10-CM

## 2018-07-17 DIAGNOSIS — Z7901 Long term (current) use of anticoagulants: Secondary | ICD-10-CM

## 2018-07-17 DIAGNOSIS — I4819 Other persistent atrial fibrillation: Secondary | ICD-10-CM

## 2018-07-17 DIAGNOSIS — Z950 Presence of cardiac pacemaker: Secondary | ICD-10-CM

## 2018-07-17 DIAGNOSIS — I2510 Atherosclerotic heart disease of native coronary artery without angina pectoris: Secondary | ICD-10-CM

## 2018-07-17 DIAGNOSIS — I25119 Atherosclerotic heart disease of native coronary artery with unspecified angina pectoris: Secondary | ICD-10-CM

## 2018-07-17 DIAGNOSIS — E782 Mixed hyperlipidemia: Secondary | ICD-10-CM

## 2018-07-17 DIAGNOSIS — I5042 Chronic combined systolic (congestive) and diastolic (congestive) heart failure: Secondary | ICD-10-CM

## 2018-07-17 NOTE — Patient Instructions (Addendum)
Medication Instructions:  Your physician recommends that you continue on your current medications as directed. Please refer to the Current Medication list given to you today.  If you need a refill on your cardiac medications before your next appointment, please call your pharmacy.   Lab work: None If you have labs (blood work) drawn today and your tests are completely normal, you will receive your results only by: Marland Kitchen MyChart Message (if you have MyChart) OR . A paper copy in the mail If you have any lab test that is abnormal or we need to change your treatment, we will call you to review the results.  Testing/Procedures: None  Follow-Up: At [facility], you and your health needs are our priority.  As part of our continuing mission to provide you with exceptional heart care, we have created designated Provider Care Teams.  These Care Teams include your primary Cardiologist (physician) and Advanced Practice Providers (APPs -  Physician Assistants and Nurse Practitioners) who all work together to provide you with the care you need, when you need it. You will need a follow up appointment in 3 months: Wednesday, 10/16/2018, at 10:00 am in the [facility] office.

## 2018-07-17 NOTE — Telephone Encounter (Signed)
Left message to call back to make 3 month follow up appointment.

## 2018-07-18 ENCOUNTER — Telehealth: Payer: Medicare Other | Admitting: Cardiology

## 2018-07-19 ENCOUNTER — Other Ambulatory Visit: Payer: Self-pay | Admitting: Cardiology

## 2018-07-19 ENCOUNTER — Other Ambulatory Visit: Payer: Self-pay | Admitting: *Deleted

## 2018-07-19 NOTE — Telephone Encounter (Signed)
Please refill. Thanks!

## 2018-07-21 ENCOUNTER — Other Ambulatory Visit: Payer: Self-pay | Admitting: Cardiology

## 2018-07-22 ENCOUNTER — Other Ambulatory Visit: Payer: Self-pay | Admitting: *Deleted

## 2018-07-24 DIAGNOSIS — J019 Acute sinusitis, unspecified: Secondary | ICD-10-CM | POA: Diagnosis not present

## 2018-07-25 ENCOUNTER — Ambulatory Visit (INDEPENDENT_AMBULATORY_CARE_PROVIDER_SITE_OTHER): Payer: Medicare Other | Admitting: *Deleted

## 2018-07-25 ENCOUNTER — Other Ambulatory Visit: Payer: Self-pay

## 2018-07-25 DIAGNOSIS — Z86718 Personal history of other venous thrombosis and embolism: Secondary | ICD-10-CM | POA: Diagnosis not present

## 2018-07-25 DIAGNOSIS — I4819 Other persistent atrial fibrillation: Secondary | ICD-10-CM | POA: Diagnosis not present

## 2018-07-25 LAB — POCT INR: INR: 2.5 (ref 2.0–3.0)

## 2018-07-25 NOTE — Patient Instructions (Addendum)
Description   Continue taking 1 tablet daily. Recheck in 4 weeks. Call us with any medication changes or concerns. [facility] # [phone] office.

## 2018-08-14 DIAGNOSIS — H903 Sensorineural hearing loss, bilateral: Secondary | ICD-10-CM | POA: Diagnosis not present

## 2018-08-14 DIAGNOSIS — H9113 Presbycusis, bilateral: Secondary | ICD-10-CM

## 2018-08-14 DIAGNOSIS — H833X3 Noise effects on inner ear, bilateral: Secondary | ICD-10-CM

## 2018-08-14 HISTORY — DX: Presbycusis, bilateral: H91.13

## 2018-08-14 HISTORY — DX: Noise effects on inner ear, bilateral: H83.3X3

## 2018-08-15 ENCOUNTER — Telehealth: Payer: Self-pay

## 2018-08-15 NOTE — Telephone Encounter (Signed)
lmom for prescreen  

## 2018-08-22 ENCOUNTER — Ambulatory Visit (INDEPENDENT_AMBULATORY_CARE_PROVIDER_SITE_OTHER): Payer: Medicare Other | Admitting: *Deleted

## 2018-08-22 ENCOUNTER — Other Ambulatory Visit: Payer: Self-pay

## 2018-08-22 DIAGNOSIS — I4819 Other persistent atrial fibrillation: Secondary | ICD-10-CM | POA: Diagnosis not present

## 2018-08-22 DIAGNOSIS — Z86718 Personal history of other venous thrombosis and embolism: Secondary | ICD-10-CM

## 2018-08-22 LAB — POCT INR: INR: 2.8 (ref 2.0–3.0)

## 2018-08-22 NOTE — Patient Instructions (Signed)
Description   Continue taking 1 tablet daily. Recheck in 5 weeks. Call us with any medication changes or concerns. [facility] # [phone] office.

## 2018-08-23 ENCOUNTER — Telehealth: Payer: Self-pay

## 2018-08-23 NOTE — Telephone Encounter (Signed)
Pt called states he is having bleeding from a hemorrhoid and had INR checked yesterday and it was ok, so he wants to know who to contact.  INR was 2.8 yesterday, advised pt to contact primary MD he states he has already done that and he is awaiting all back.  Advised pt he could also contact Dr Earlean Shawl his GI MD and make him aware and see if they have any immediate recommendations for him to try or do to resolve bleeding.  Advised pt to go to ED if bleeding persists or increases in amount.  Pt verbalized understanding.

## 2018-08-31 ENCOUNTER — Other Ambulatory Visit: Payer: Self-pay | Admitting: Cardiology

## 2018-09-11 ENCOUNTER — Other Ambulatory Visit: Payer: Self-pay | Admitting: Physician Assistant

## 2018-09-11 DIAGNOSIS — K648 Other hemorrhoids: Secondary | ICD-10-CM | POA: Insufficient documentation

## 2018-09-11 DIAGNOSIS — K921 Melena: Secondary | ICD-10-CM | POA: Diagnosis not present

## 2018-09-11 DIAGNOSIS — Z7901 Long term (current) use of anticoagulants: Secondary | ICD-10-CM | POA: Diagnosis not present

## 2018-09-11 DIAGNOSIS — Z7902 Long term (current) use of antithrombotics/antiplatelets: Secondary | ICD-10-CM | POA: Diagnosis not present

## 2018-09-11 DIAGNOSIS — K625 Hemorrhage of anus and rectum: Secondary | ICD-10-CM | POA: Diagnosis not present

## 2018-09-11 DIAGNOSIS — Z952 Presence of prosthetic heart valve: Secondary | ICD-10-CM | POA: Diagnosis not present

## 2018-09-11 DIAGNOSIS — Z955 Presence of coronary angioplasty implant and graft: Secondary | ICD-10-CM | POA: Diagnosis not present

## 2018-09-11 HISTORY — DX: Other hemorrhoids: K64.8

## 2018-09-17 ENCOUNTER — Ambulatory Visit (INDEPENDENT_AMBULATORY_CARE_PROVIDER_SITE_OTHER): Payer: Medicare Other | Admitting: *Deleted

## 2018-09-17 DIAGNOSIS — I442 Atrioventricular block, complete: Secondary | ICD-10-CM | POA: Diagnosis not present

## 2018-09-17 LAB — CUP PACEART REMOTE DEVICE CHECK
Battery Remaining Longevity: 82 mo
Battery Remaining Percentage: 95.5 %
Battery Voltage: 2.98 V
Brady Statistic AP VP Percent: 49 %
Brady Statistic AP VS Percent: 1 %
Brady Statistic AS VP Percent: 50 %
Brady Statistic AS VS Percent: 1 %
Brady Statistic RA Percent Paced: 12 %
Date Time Interrogation Session: 20200811060013
Implantable Lead Implant Date: 20190206
Implantable Lead Implant Date: 20190206
Implantable Lead Implant Date: 20190206
Implantable Lead Location: 753858
Implantable Lead Location: 753859
Implantable Lead Location: 753860
Implantable Pulse Generator Implant Date: 20190206
Lead Channel Impedance Value: 430 Ohm
Lead Channel Impedance Value: 480 Ohm
Lead Channel Impedance Value: 990 Ohm
Lead Channel Pacing Threshold Amplitude: 0.375 V
Lead Channel Pacing Threshold Amplitude: 0.625 V
Lead Channel Pacing Threshold Amplitude: 2.75 V
Lead Channel Pacing Threshold Pulse Width: 0.5 ms
Lead Channel Pacing Threshold Pulse Width: 0.5 ms
Lead Channel Pacing Threshold Pulse Width: 0.8 ms
Lead Channel Sensing Intrinsic Amplitude: 12 mV
Lead Channel Sensing Intrinsic Amplitude: 3.1 mV
Lead Channel Setting Pacing Amplitude: 1.375
Lead Channel Setting Pacing Amplitude: 2 V
Lead Channel Setting Pacing Amplitude: 3 V
Lead Channel Setting Pacing Pulse Width: 0.5 ms
Lead Channel Setting Pacing Pulse Width: 0.8 ms
Lead Channel Setting Sensing Sensitivity: 2 mV
Pulse Gen Model: 3262
Pulse Gen Serial Number: 8995812

## 2018-09-25 ENCOUNTER — Encounter: Payer: Self-pay | Admitting: Cardiology

## 2018-09-25 NOTE — Progress Notes (Signed)
Remote pacemaker transmission.   

## 2018-09-26 ENCOUNTER — Ambulatory Visit (INDEPENDENT_AMBULATORY_CARE_PROVIDER_SITE_OTHER): Payer: Medicare Other | Admitting: *Deleted

## 2018-09-26 ENCOUNTER — Other Ambulatory Visit: Payer: Self-pay

## 2018-09-26 DIAGNOSIS — I4819 Other persistent atrial fibrillation: Secondary | ICD-10-CM | POA: Diagnosis not present

## 2018-09-26 DIAGNOSIS — Z86718 Personal history of other venous thrombosis and embolism: Secondary | ICD-10-CM | POA: Diagnosis not present

## 2018-09-26 LAB — POCT INR: INR: 3 (ref 2.0–3.0)

## 2018-09-26 NOTE — Patient Instructions (Signed)
Description   Continue taking 1 tablet daily. Recheck in 6 weeks. Call us with any medication changes or concerns. [facility] # [phone] office.

## 2018-10-03 DIAGNOSIS — M9903 Segmental and somatic dysfunction of lumbar region: Secondary | ICD-10-CM | POA: Diagnosis not present

## 2018-10-03 DIAGNOSIS — M542 Cervicalgia: Secondary | ICD-10-CM | POA: Diagnosis not present

## 2018-10-03 DIAGNOSIS — M5417 Radiculopathy, lumbosacral region: Secondary | ICD-10-CM | POA: Diagnosis not present

## 2018-10-03 DIAGNOSIS — M9901 Segmental and somatic dysfunction of cervical region: Secondary | ICD-10-CM | POA: Diagnosis not present

## 2018-10-03 DIAGNOSIS — M62830 Muscle spasm of back: Secondary | ICD-10-CM | POA: Diagnosis not present

## 2018-10-04 DIAGNOSIS — Z8546 Personal history of malignant neoplasm of prostate: Secondary | ICD-10-CM | POA: Diagnosis not present

## 2018-10-08 ENCOUNTER — Other Ambulatory Visit: Payer: Self-pay | Admitting: Cardiology

## 2018-10-09 DIAGNOSIS — M5417 Radiculopathy, lumbosacral region: Secondary | ICD-10-CM | POA: Diagnosis not present

## 2018-10-09 DIAGNOSIS — M542 Cervicalgia: Secondary | ICD-10-CM | POA: Diagnosis not present

## 2018-10-09 DIAGNOSIS — M62830 Muscle spasm of back: Secondary | ICD-10-CM | POA: Diagnosis not present

## 2018-10-09 DIAGNOSIS — M9903 Segmental and somatic dysfunction of lumbar region: Secondary | ICD-10-CM | POA: Diagnosis not present

## 2018-10-09 DIAGNOSIS — M9901 Segmental and somatic dysfunction of cervical region: Secondary | ICD-10-CM | POA: Diagnosis not present

## 2018-10-11 DIAGNOSIS — Z8546 Personal history of malignant neoplasm of prostate: Secondary | ICD-10-CM | POA: Diagnosis not present

## 2018-10-11 DIAGNOSIS — N393 Stress incontinence (female) (male): Secondary | ICD-10-CM | POA: Diagnosis not present

## 2018-10-15 NOTE — Progress Notes (Signed)
Cardiology Office Note:    Date:  10/16/2018   ID:  [name], DOB Jul 24, 1931, MRN [id number]  PCP:  Shirline Frees, MD  Cardiologist:  Shirlee More, MD    Referring MD: Shirline Frees, MD    ASSESSMENT:    1. S/P TAVR (transcatheter aortic valve replacement)   2. Persistent atrial fibrillation   3. Cardiac pacemaker in situ   4. Long term (current) use of anticoagulants   5. Chronic combined systolic and diastolic heart failure ([place])   6. Coronary artery disease involving native coronary artery of native heart with angina pectoris ([place])   7. Mixed hyperlipidemia    PLAN:    In order of problems listed above:  1. [name] is doing very well since TAVR Follow-up echocardiogram performed February 2021.  He will continue combined aspirin and warfarin.  No murmur on exam 2. Underlying A. fib 100% ventricularly paced normal pacemaker functioning continue warfarin anticoagulation 3. Heart failure is compensated continue his current loop diuretic he tells me he will see Dr. Kenton Kingfisher later this month and have lab work performed the last labs I can access are from February 2020 creatinine 1.64 potassium 4.9 last lipid from September 2019 cholesterol 99 LDL 52 4. Stable CAD having no angina New York Heart Association class I continue medical therapy 5. Continue a statin lipids at target   Next appointment: 6 months   Medication Adjustments/Labs and Tests Ordered: Current medicines are reviewed at length with the patient today.  Concerns regarding medicines are outlined above.  No orders of the defined types were placed in this encounter.  No orders of the defined types were placed in this encounter.   Chief Complaint  Patient presents with  . Follow-up    after TAVR  . Coronary Artery Disease  . Atrial Fibrillation    and pacemaker  . Anticoagulation  . Hypertension  . Congestive Heart Failure    History of Present Illness:    [name] is a 85 y.o. male with  a hx of CAD s/p PCI with DES to RCA 03/18/18, severe AS s/p TAVR 03/19/18, persistent atrial fibrillation anticoagulated on Warfarin, recurrent DVT/PE s/p IVC filter, HTN, combined heart failure, and a permanent pacemaker last seen 07/17/2018 and a virtual visit.  This check 09/17/2018 is atrial fibrillation controlled rate pacemaker function projected battery life of 82 months he is paced 99% of the time in the right ventricle. Compliance with diet, lifestyle and medications: Yes  Overall he is happy pleased with the quality of his life no chest pain shortness of breath edema palpitation or syncope.  He is a little short of breath when he does strenuous activity but walks several times a day and notices a little bit of trouble with his balance new treatments and the fact that he was less active during the hot humid weather. Past Medical History:  Diagnosis Date  . Arthritis    "knees, right shoulder" (03/14/2017)  . Atrial fibrillation, persistent 09/19/2014  . CAD (coronary artery disease), native coronary artery    Cath 2011 LHC (08/2009):~ Proximal LAD 30%, mid to distal LAD 25%, ostial small D1 75% mid AV groove circumflex 99% been subtotal stenosis, proximal to mid RCA 25-30%, mid RCA 30%, mid PDA 30%, EF 50% with inferior hypokinesis.;    July 2011  PCI and DES to circumflex Dr. Olevia Perches   ETT-Myoview (06/2013):  Inferolateral scar, mild peri-infarct ischemia, EF 40%; Intermediate Risk   ECHO EF 55% 03/2013   .  Cardiac pacemaker in situ 03/14/2017   Biventricular St. Jude inserted 03/14/17 Dr. Lovena Le for second degree heart block   . CKD (chronic kidney disease), stage III ([place])   . GERD   . History of infection of prosthetic knee 04/05/2015   Treated with debridement and irrigation followed by 6 months of triple antibiotics  . History of kidney stones   . History of peptic ulcer 1970s?  Marland Kitchen History of prostate cancer    Radical prostatectomy in 2006 Dr. Terance Hart   . Hypertensive heart disease   .  Long term (current) use of anticoagulants 10/06/2011  . Mixed hyperlipidemia   . Moderate persistent asthma without complication 99991111  . Obesity (BMI 30-39.9)   . OSA on CPAP   . 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)    Initial DVT in 2006 after prostate surgery and had [place] filter placed Bilateral  PE 2013 and placed back on warfarin DVT of right subclavian vein on doppler 10/2012 at time of knee infection   . Personal history of malignant neoplasm of prostate    Radical prostatectomy in 2006 Dr. Terance Hart    . Severe aortic stenosis   . Severe aortic stenosis 03/18/2018  . Urinary incontinence    MULTIPLE BLADDER SURGERIES - STATES NO URINARY SPHINCTER - PT'S UROLOGIST IS AT [facility]- DR. PETERSON  ( LAST VISIT WAS 09/15/11 )    Past Surgical History:  Procedure Laterality Date  . APPENDECTOMY    . BI-VENTRICULAR PACEMAKER INSERTION (CRT-P)  03/14/2017  . BIV PACEMAKER INSERTION CRT-P N/A 03/14/2017   Procedure: BIV PACEMAKER INSERTION CRT-P;  Surgeon: Evans Lance, MD;  Location: [place] [facility];  Service: Cardiovascular;  Laterality: N/A;  . CARDIOVERSION N/A 10/08/2014   Procedure: CARDIOVERSION;  Surgeon: Jacolyn Reedy, MD;  Location: [facility];  Service: Cardiovascular;  Laterality: N/A;  . CATARACT EXTRACTION W/ INTRAOCULAR LENS  IMPLANT, BILATERAL Bilateral   . CORONARY ANGIOPLASTY WITH STENT PLACEMENT  08/25/2009   DES-mid LCx 08/2009; 30% pLAD, 25% m/dLAD, 75% ostial D1, 99% mLCx s/p DES, 25-30% p/mRCA, 40% mRCA, 30% mPDA stenoses; LVEF 50%, inf hypokinesis  . CORONARY STENT INTERVENTION N/A 03/18/2018   Procedure: CORONARY STENT INTERVENTION;  Surgeon: Sherren Mocha, MD;  Location: [place] [facility];  Service: Cardiovascular;  Laterality: N/A;  . EXCISIONAL HEMORRHOIDECTOMY    . I&D KNEE WITH POLY EXCHANGE Left 10/09/2012   Procedure: IRRIGATION AND DEBRIDEMENT LEFT KNEE WITH POLY REVISION;  Surgeon: Gearlean Alf, MD;  Location: [facility]  ORS;  Service: Orthopedics;  Laterality: Left;  . INGUINAL HERNIA REPAIR Right   . INTRAOPERATIVE TRANSTHORACIC ECHOCARDIOGRAM  03/19/2018   Procedure: Intraoperative Transthoracic Echocardiogram;  Surgeon: Sherren Mocha, MD;  Location: [place];  Service: Open Heart Surgery;;  . JOINT REPLACEMENT    . KNEE ARTHROTOMY Left 10/09/2012   Procedure: LEFT KNEE ARTHROTOMY;  Surgeon: Gearlean Alf, MD;  Location: [facility] ORS;  Service: Orthopedics;  Laterality: Left;  . LEFT HEART CATH AND CORONARY ANGIOGRAPHY N/A 02/28/2018   Procedure: LEFT HEART CATH AND CORONARY ANGIOGRAPHY;  Surgeon: Sherren Mocha, MD;  Location: [place] [facility];  Service: Cardiovascular;  Laterality: N/A;  . PROSTATECTOMY  04/25/2004  . REPLACEMENT TOTAL KNEE Bilateral   . SKIN BIOPSY     "off nose; wasn't cancer; it was tested" (03/14/2017)  . TRANSCATHETER AORTIC VALVE REPLACEMENT, TRANSFEMORAL N/A 03/19/2018   Procedure: TRANSCATHETER AORTIC VALVE REPLACEMENT, TRANSFEMORAL;  Surgeon: Sherren Mocha, MD;  Location[facility]  OR;  Service: Open Heart Surgery;  Laterality: N/A;  . Uretheral implants     multiple for incontinence  . VENA CAVA FILTER PLACEMENT      Current Medications: Current Meds  Medication Sig  . amLODipine (NORVASC) 2.5 MG tablet Take 2.5 mg by mouth every evening.   . Ascorbic Acid (VITAMIN C) 1000 MG tablet Take 1,000 mg by mouth daily.  Marland Kitchen aspirin EC 81 MG tablet Take 81 mg by mouth daily.  . Biotin 2500 MCG CAPS Take 2,500 mcg by mouth daily.  . cholecalciferol (VITAMIN D) 1000 UNITS tablet Take 1,000 Units by mouth daily.  Marland Kitchen docusate sodium (COLACE) 50 MG capsule Take 50 mg by mouth daily.  . fenofibrate 160 MG tablet TAKE 1 TABLET BY MOUTH EVERY DAY  . furosemide (LASIX) 20 MG tablet Take 20 mg by mouth daily.  . Glucosamine HCl (GLUCOSAMINE PO) Take 2,000 mg by mouth daily.  . Ibuprofen-diphenhydrAMINE Cit (ADVIL PM PO) Take 1 tablet by mouth daily.  . Multiple Vitamins-Minerals (ADULT GUMMY PO) Take 1  tablet by mouth daily.  . nitroGLYCERIN (NITROSTAT) 0.4 MG SL tablet Place 1 tablet (0.4 mg total) under the tongue every 5 (five) minutes as needed.  . Omega-3 Fatty Acids (FISH OIL) 1000 MG CAPS Take 1,000 mg by mouth daily.  . pantoprazole (PROTONIX) 40 MG tablet Take 1 tablet (40 mg total) by mouth daily.  . pravastatin (PRAVACHOL) 20 MG tablet TAKE 1 TABLET BY MOUTH EVERY DAY  . vitamin B-12 (CYANOCOBALAMIN) 500 MCG tablet Take 500 mcg by mouth daily.  Marland Kitchen warfarin (COUMADIN) 2.5 MG tablet TAKE 1 TABLET BY MOUTH EVERY DAY     Allergies:   Darifenacin and Lisinopril   Social History   Socioeconomic History  . Marital status: Married    Spouse name: Not on file  . Number of children: 3  . Years of education: Not on file  . Highest education level: Not on file  Occupational History  . Occupation: Retired Therapist, nutritional  Social Needs  . Financial resource strain: Not on file  . Food insecurity    Worry: Not on file    Inability: Not on file  . Transportation needs    Medical: Not on file    Non-medical: Not on file  Tobacco Use  . Smoking status: Former Smoker    Packs/day: 1.00    Years: 10.00    Pack years: 10.00    Types: Cigarettes    Quit date: 04/27/1961    Years since quitting: 57.5  . Smokeless tobacco: Never Used  Substance and Sexual Activity  . Alcohol use: No    Alcohol/week: 0.0 standard drinks  . Drug use: No  . Sexual activity: Not on file  Lifestyle  . Physical activity    Days per week: Not on file    Minutes per session: Not on file  . Stress: Not on file  Relationships  . Social Herbalist on phone: Not on file    Gets together: Not on file    Attends religious service: Not on file    Active member of club or organization: Not on file    Attends meetings of clubs or organizations: Not on file    Relationship status: Not on file  Other Topics Concern  . Not on file  Social History Narrative  . Not on file     Family History: The  patient's family history includes Melanoma in his brother and father.  ROS:   Please see the history of present illness.    All other systems reviewed and are negative.  EKGs/Labs/Other Studies Reviewed:    The following studies were reviewed today:  EKG:  EKG ordered today and personally reviewed.  The ekg ordered today demonstrates underlying atrial fibrillation 100% ventricularly paced  Recent Labs: 03/20/2018: Magnesium 1.7 03/27/2018: BUN 30; Creatinine, Ser 1.64; Hemoglobin 10.3; Platelets 233; Potassium 4.9; Sodium 141  Recent Lipid Panel    Component Value Date/Time   CHOL 118 04/28/2011 1003   TRIG 85.0 04/28/2011 1003   HDL 34.90 (L) 04/28/2011 1003   CHOLHDL 3 04/28/2011 1003   VLDL 17.0 04/28/2011 1003   LDLCALC 66 04/28/2011 1003    Physical Exam:    VS:  BP 128/74 (BP Location: Right Arm, Patient Position: Sitting, Cuff Size: Large)   Pulse 63   Temp (!) 95 F (35 C)   Ht 6' (1.829 m)   Wt 238 lb (108 kg)   SpO2 98%   BMI 32.28 kg/m     Wt Readings from Last 3 Encounters:  10/16/18 238 lb (108 kg)  07/17/18 232 lb (105.2 kg)  03/27/18 237 lb (107.5 kg)     GEN:  Well nourished, well developed in no acute distress HEENT: Normal NECK: No JVD; No carotid bruits LYMPHATICS: No lymphadenopathy CARDIAC: S1 is variable no murmur RRR, no murmurs, rubs, gallops RESPIRATORY:  Clear to auscultation without rales, wheezing or rhonchi  ABDOMEN: Soft, non-tender, non-distended MUSCULOSKELETAL:  No edema; No deformity  SKIN: Warm and dry NEUROLOGIC:  Alert and oriented x 3 PSYCHIATRIC:  Normal affect    Signed, Shirlee More, MD  10/16/2018 10:22 AM    [facility]

## 2018-10-16 ENCOUNTER — Ambulatory Visit (INDEPENDENT_AMBULATORY_CARE_PROVIDER_SITE_OTHER): Payer: Medicare Other | Admitting: Cardiology

## 2018-10-16 ENCOUNTER — Telehealth: Payer: Self-pay | Admitting: Internal Medicine

## 2018-10-16 ENCOUNTER — Other Ambulatory Visit: Payer: Self-pay

## 2018-10-16 ENCOUNTER — Encounter: Payer: Self-pay | Admitting: Cardiology

## 2018-10-16 VITALS — BP 128/74 | HR 63 | Temp 95.0°F | Ht 72.0 in | Wt 238.0 lb

## 2018-10-16 DIAGNOSIS — Z952 Presence of prosthetic heart valve: Secondary | ICD-10-CM

## 2018-10-16 DIAGNOSIS — Z7901 Long term (current) use of anticoagulants: Secondary | ICD-10-CM

## 2018-10-16 DIAGNOSIS — E782 Mixed hyperlipidemia: Secondary | ICD-10-CM

## 2018-10-16 DIAGNOSIS — I25119 Atherosclerotic heart disease of native coronary artery with unspecified angina pectoris: Secondary | ICD-10-CM | POA: Diagnosis not present

## 2018-10-16 DIAGNOSIS — Z950 Presence of cardiac pacemaker: Secondary | ICD-10-CM

## 2018-10-16 DIAGNOSIS — I5042 Chronic combined systolic (congestive) and diastolic (congestive) heart failure: Secondary | ICD-10-CM

## 2018-10-16 DIAGNOSIS — I4819 Other persistent atrial fibrillation: Secondary | ICD-10-CM | POA: Diagnosis not present

## 2018-10-16 NOTE — Addendum Note (Signed)
Addended by: Stevan Born on: 10/16/2018 10:27 AM   Modules accepted: Orders

## 2018-10-16 NOTE — Telephone Encounter (Signed)
Reports he feel his whole body move like a hiccup with no audible sound like a hiccup. Denis CP, SOB or syncope with episodes of "hiccups".Reports he does not fell hiccup sensation, thump or stimulation in chest wall when the "hiccups" occur. Reports if he takes a dep breath the hiccups stop. Stated he feels the "hiccup' senasation in entire body not just in chest wall. Pt seen by Dr Bettina Gavia today and scheduled for ECHO 10/29/18. Recommended patient contact PCP about episodes.

## 2018-10-16 NOTE — Telephone Encounter (Signed)
New message   Patient states that he has hiccups and thinks it may be in reference to an issue with device. Please call.

## 2018-10-16 NOTE — Patient Instructions (Addendum)
Medication Instructions:  Your physician recommends that you continue on your current medications as directed. Please refer to the Current Medication list given to you today.  If you need a refill on your cardiac medications before your next appointment, please call your pharmacy.   Lab work: NONE If you have labs (blood work) drawn today and your tests are completely normal, you will receive your results only by: Marland Kitchen MyChart Message (if you have MyChart) OR . A paper copy in the mail If you have any lab test that is abnormal or we need to change your treatment, we will call you to review the results.  Testing/Procedures: You had an EKG today.  Your physician has requested that you have an echocardiogram. Echocardiography is a painless test that uses sound waves to create images of your heart. It provides your doctor with information about the size and shape of your heart and how well your heart's chambers and valves are working. This procedure takes approximately one hour. There are no restrictions for this procedure.   Follow-Up: At [facility], you and your health needs are our priority.  As part of our continuing mission to provide you with exceptional heart care, we have created designated Provider Care Teams.  These Care Teams include your primary Cardiologist (physician) and Advanced Practice Providers (APPs -  Physician Assistants and Nurse Practitioners) who all work together to provide you with the care you need, when you need it. You will need a follow up appointment in 6 months.  Please call our office 2 months in advance to schedule this appointment.

## 2018-10-17 DIAGNOSIS — M542 Cervicalgia: Secondary | ICD-10-CM | POA: Diagnosis not present

## 2018-10-17 DIAGNOSIS — M9901 Segmental and somatic dysfunction of cervical region: Secondary | ICD-10-CM | POA: Diagnosis not present

## 2018-10-17 DIAGNOSIS — M62830 Muscle spasm of back: Secondary | ICD-10-CM | POA: Diagnosis not present

## 2018-10-17 DIAGNOSIS — M5417 Radiculopathy, lumbosacral region: Secondary | ICD-10-CM | POA: Diagnosis not present

## 2018-10-17 DIAGNOSIS — M9903 Segmental and somatic dysfunction of lumbar region: Secondary | ICD-10-CM | POA: Diagnosis not present

## 2018-10-29 ENCOUNTER — Other Ambulatory Visit: Payer: Self-pay

## 2018-10-29 ENCOUNTER — Ambulatory Visit (HOSPITAL_BASED_OUTPATIENT_CLINIC_OR_DEPARTMENT_OTHER)
Admission: RE | Admit: 2018-10-29 | Discharge: 2018-10-29 | Disposition: A | Discharge status: Home / Self Care | Payer: Medicare Other | Source: Ambulatory Visit | Attending: Cardiology | Admitting: Cardiology

## 2018-10-29 DIAGNOSIS — Z952 Presence of prosthetic heart valve: Secondary | ICD-10-CM | POA: Diagnosis not present

## 2018-10-29 DIAGNOSIS — I4819 Other persistent atrial fibrillation: Secondary | ICD-10-CM | POA: Diagnosis not present

## 2018-10-29 NOTE — Progress Notes (Signed)
  Echocardiogram 2D Echocardiogram has been performed.  Cardell Peach 10/29/2018, 11:13 AM

## 2018-10-30 DIAGNOSIS — M542 Cervicalgia: Secondary | ICD-10-CM | POA: Diagnosis not present

## 2018-10-30 DIAGNOSIS — M9903 Segmental and somatic dysfunction of lumbar region: Secondary | ICD-10-CM | POA: Diagnosis not present

## 2018-10-30 DIAGNOSIS — M9901 Segmental and somatic dysfunction of cervical region: Secondary | ICD-10-CM | POA: Diagnosis not present

## 2018-10-30 DIAGNOSIS — M5417 Radiculopathy, lumbosacral region: Secondary | ICD-10-CM | POA: Diagnosis not present

## 2018-10-30 DIAGNOSIS — M62830 Muscle spasm of back: Secondary | ICD-10-CM | POA: Diagnosis not present

## 2018-11-07 ENCOUNTER — Ambulatory Visit (INDEPENDENT_AMBULATORY_CARE_PROVIDER_SITE_OTHER): Payer: Medicare Other | Admitting: *Deleted

## 2018-11-07 ENCOUNTER — Other Ambulatory Visit: Payer: Self-pay

## 2018-11-07 DIAGNOSIS — I4819 Other persistent atrial fibrillation: Secondary | ICD-10-CM | POA: Diagnosis not present

## 2018-11-07 DIAGNOSIS — Z86718 Personal history of other venous thrombosis and embolism: Secondary | ICD-10-CM | POA: Diagnosis not present

## 2018-11-07 LAB — POCT INR: INR: 3.7 — AB (ref 2.0–3.0)

## 2018-11-07 NOTE — Patient Instructions (Addendum)
Description   Do not take any Coumadin today then continue taking 1 tablet daily. Recheck in 4 weeks (usually 6wks). Call us with any medication changes or concerns. [facility] # [phone] office.

## 2018-11-08 DIAGNOSIS — M9903 Segmental and somatic dysfunction of lumbar region: Secondary | ICD-10-CM | POA: Diagnosis not present

## 2018-11-08 DIAGNOSIS — M62830 Muscle spasm of back: Secondary | ICD-10-CM | POA: Diagnosis not present

## 2018-11-08 DIAGNOSIS — M9901 Segmental and somatic dysfunction of cervical region: Secondary | ICD-10-CM | POA: Diagnosis not present

## 2018-11-08 DIAGNOSIS — M5417 Radiculopathy, lumbosacral region: Secondary | ICD-10-CM | POA: Diagnosis not present

## 2018-11-08 DIAGNOSIS — M542 Cervicalgia: Secondary | ICD-10-CM | POA: Diagnosis not present

## 2018-11-09 DIAGNOSIS — R8281 Pyuria: Secondary | ICD-10-CM | POA: Diagnosis not present

## 2018-11-09 DIAGNOSIS — R319 Hematuria, unspecified: Secondary | ICD-10-CM | POA: Diagnosis not present

## 2018-11-18 DIAGNOSIS — E038 Other specified hypothyroidism: Secondary | ICD-10-CM | POA: Diagnosis not present

## 2018-11-18 DIAGNOSIS — E0590 Thyrotoxicosis, unspecified without thyrotoxic crisis or storm: Secondary | ICD-10-CM | POA: Diagnosis not present

## 2018-11-18 DIAGNOSIS — R05 Cough: Secondary | ICD-10-CM | POA: Diagnosis not present

## 2018-11-18 DIAGNOSIS — N3001 Acute cystitis with hematuria: Secondary | ICD-10-CM | POA: Diagnosis not present

## 2018-11-18 DIAGNOSIS — I2510 Atherosclerotic heart disease of native coronary artery without angina pectoris: Secondary | ICD-10-CM | POA: Diagnosis not present

## 2018-11-18 DIAGNOSIS — N1832 Chronic kidney disease, stage 3b: Secondary | ICD-10-CM | POA: Diagnosis not present

## 2018-11-18 DIAGNOSIS — I10 Essential (primary) hypertension: Secondary | ICD-10-CM | POA: Diagnosis not present

## 2018-11-18 DIAGNOSIS — Z7901 Long term (current) use of anticoagulants: Secondary | ICD-10-CM | POA: Diagnosis not present

## 2018-11-18 DIAGNOSIS — R319 Hematuria, unspecified: Secondary | ICD-10-CM | POA: Diagnosis not present

## 2018-11-18 DIAGNOSIS — E7800 Pure hypercholesterolemia, unspecified: Secondary | ICD-10-CM | POA: Diagnosis not present

## 2018-11-20 DIAGNOSIS — M542 Cervicalgia: Secondary | ICD-10-CM | POA: Diagnosis not present

## 2018-11-20 DIAGNOSIS — M9901 Segmental and somatic dysfunction of cervical region: Secondary | ICD-10-CM | POA: Diagnosis not present

## 2018-11-20 DIAGNOSIS — M62830 Muscle spasm of back: Secondary | ICD-10-CM | POA: Diagnosis not present

## 2018-11-20 DIAGNOSIS — M9903 Segmental and somatic dysfunction of lumbar region: Secondary | ICD-10-CM | POA: Diagnosis not present

## 2018-11-20 DIAGNOSIS — M5417 Radiculopathy, lumbosacral region: Secondary | ICD-10-CM | POA: Diagnosis not present

## 2018-12-02 DIAGNOSIS — R319 Hematuria, unspecified: Secondary | ICD-10-CM | POA: Diagnosis not present

## 2018-12-02 DIAGNOSIS — R05 Cough: Secondary | ICD-10-CM | POA: Diagnosis not present

## 2018-12-02 DIAGNOSIS — N3001 Acute cystitis with hematuria: Secondary | ICD-10-CM | POA: Diagnosis not present

## 2018-12-05 ENCOUNTER — Other Ambulatory Visit: Payer: Self-pay

## 2018-12-05 ENCOUNTER — Ambulatory Visit (INDEPENDENT_AMBULATORY_CARE_PROVIDER_SITE_OTHER): Payer: Medicare Other | Admitting: *Deleted

## 2018-12-05 DIAGNOSIS — Z86718 Personal history of other venous thrombosis and embolism: Secondary | ICD-10-CM

## 2018-12-05 DIAGNOSIS — I4819 Other persistent atrial fibrillation: Secondary | ICD-10-CM | POA: Diagnosis not present

## 2018-12-05 DIAGNOSIS — Z5181 Encounter for therapeutic drug level monitoring: Secondary | ICD-10-CM | POA: Diagnosis not present

## 2018-12-05 LAB — POCT INR: INR: 3.4 — AB (ref 2.0–3.0)

## 2018-12-05 NOTE — Patient Instructions (Signed)
Description   Do not take any Coumadin today then start taking 1 tablet daily except for 1/2 a tablet on Mondays. Recheck in 2 weeks. Call us with any medication changes or concerns. [facility] # [phone] office.

## 2018-12-06 DIAGNOSIS — M62830 Muscle spasm of back: Secondary | ICD-10-CM | POA: Diagnosis not present

## 2018-12-06 DIAGNOSIS — M9903 Segmental and somatic dysfunction of lumbar region: Secondary | ICD-10-CM | POA: Diagnosis not present

## 2018-12-06 DIAGNOSIS — M542 Cervicalgia: Secondary | ICD-10-CM | POA: Diagnosis not present

## 2018-12-06 DIAGNOSIS — M5417 Radiculopathy, lumbosacral region: Secondary | ICD-10-CM | POA: Diagnosis not present

## 2018-12-06 DIAGNOSIS — M9901 Segmental and somatic dysfunction of cervical region: Secondary | ICD-10-CM | POA: Diagnosis not present

## 2018-12-17 ENCOUNTER — Ambulatory Visit (INDEPENDENT_AMBULATORY_CARE_PROVIDER_SITE_OTHER): Payer: Medicare Other | Admitting: *Deleted

## 2018-12-17 DIAGNOSIS — I5042 Chronic combined systolic (congestive) and diastolic (congestive) heart failure: Secondary | ICD-10-CM

## 2018-12-17 DIAGNOSIS — I4819 Other persistent atrial fibrillation: Secondary | ICD-10-CM

## 2018-12-17 LAB — CUP PACEART REMOTE DEVICE CHECK
Battery Remaining Longevity: 81 mo
Battery Remaining Percentage: 95.5 %
Battery Voltage: 2.98 V
Brady Statistic AP VP Percent: 49 %
Brady Statistic AP VS Percent: 1 %
Brady Statistic AS VP Percent: 50 %
Brady Statistic AS VS Percent: 1 %
Brady Statistic RA Percent Paced: 10 %
Date Time Interrogation Session: 20201110070026
Implantable Lead Implant Date: 20190206
Implantable Lead Implant Date: 20190206
Implantable Lead Implant Date: 20190206
Implantable Lead Location: 753858
Implantable Lead Location: 753859
Implantable Lead Location: 753860
Implantable Pulse Generator Implant Date: 20190206
Lead Channel Impedance Value: 430 Ohm
Lead Channel Impedance Value: 480 Ohm
Lead Channel Impedance Value: 990 Ohm
Lead Channel Pacing Threshold Amplitude: 0.375 V
Lead Channel Pacing Threshold Amplitude: 0.625 V
Lead Channel Pacing Threshold Amplitude: 2.75 V
Lead Channel Pacing Threshold Pulse Width: 0.5 ms
Lead Channel Pacing Threshold Pulse Width: 0.5 ms
Lead Channel Pacing Threshold Pulse Width: 0.8 ms
Lead Channel Sensing Intrinsic Amplitude: 12 mV
Lead Channel Sensing Intrinsic Amplitude: 3.1 mV
Lead Channel Setting Pacing Amplitude: 1.375
Lead Channel Setting Pacing Amplitude: 2 V
Lead Channel Setting Pacing Amplitude: 3 V
Lead Channel Setting Pacing Pulse Width: 0.5 ms
Lead Channel Setting Pacing Pulse Width: 0.8 ms
Lead Channel Setting Sensing Sensitivity: 2 mV
Pulse Gen Model: 3262
Pulse Gen Serial Number: 8995812

## 2018-12-19 ENCOUNTER — Other Ambulatory Visit: Payer: Self-pay

## 2018-12-19 ENCOUNTER — Ambulatory Visit (INDEPENDENT_AMBULATORY_CARE_PROVIDER_SITE_OTHER): Payer: Medicare Other | Admitting: *Deleted

## 2018-12-19 DIAGNOSIS — I4819 Other persistent atrial fibrillation: Secondary | ICD-10-CM | POA: Diagnosis not present

## 2018-12-19 DIAGNOSIS — Z86718 Personal history of other venous thrombosis and embolism: Secondary | ICD-10-CM

## 2018-12-19 LAB — POCT INR: INR: 2.7 (ref 2.0–3.0)

## 2018-12-19 NOTE — Patient Instructions (Addendum)
Description   Continue taking 1 tablet daily except for 1/2 a tablet on Mondays. Recheck in 4 weeks. Call us with any medication changes or concerns. [facility] # [phone] office.     If you miss a dose you have 12 hours to take the dose and if you are unsure please call us at [phone]

## 2018-12-23 DIAGNOSIS — M9903 Segmental and somatic dysfunction of lumbar region: Secondary | ICD-10-CM | POA: Diagnosis not present

## 2018-12-23 DIAGNOSIS — M546 Pain in thoracic spine: Secondary | ICD-10-CM | POA: Diagnosis not present

## 2018-12-23 DIAGNOSIS — M5417 Radiculopathy, lumbosacral region: Secondary | ICD-10-CM | POA: Diagnosis not present

## 2018-12-23 DIAGNOSIS — M9901 Segmental and somatic dysfunction of cervical region: Secondary | ICD-10-CM | POA: Diagnosis not present

## 2018-12-23 DIAGNOSIS — M62830 Muscle spasm of back: Secondary | ICD-10-CM | POA: Diagnosis not present

## 2018-12-31 ENCOUNTER — Other Ambulatory Visit: Payer: Self-pay | Admitting: Cardiology

## 2018-12-31 MED ORDER — PRAVASTATIN SODIUM 20 MG PO TABS: 20.0000 mg | ORAL_TABLET | Freq: Every day | ORAL | 1 refills | Status: DC

## 2018-12-31 NOTE — Telephone Encounter (Signed)
°*  STAT* If patient is at the pharmacy, call can be transferred to refill team.   1. Which medications need to be refilled? (please list name of each medication and dose if known) Pravastatin 20mg   2. Which pharmacy/location (including street and city if local pharmacy) is medication to be sent to?[facility] [place]  3. Do they need a 30 day or 90 day supply? 90 plus refills

## 2018-12-31 NOTE — Telephone Encounter (Signed)
Rx for pravastatin 20mg  sent to pharmacy as requested.

## 2019-01-01 ENCOUNTER — Other Ambulatory Visit: Payer: Self-pay

## 2019-01-01 MED ORDER — PRAVASTATIN SODIUM 20 MG PO TABS: 20.0000 mg | ORAL_TABLET | Freq: Every day | ORAL | 1 refills | Status: DC

## 2019-01-06 NOTE — Progress Notes (Signed)
Remote pacemaker transmission.   

## 2019-01-09 DIAGNOSIS — M62830 Muscle spasm of back: Secondary | ICD-10-CM | POA: Diagnosis not present

## 2019-01-09 DIAGNOSIS — M9901 Segmental and somatic dysfunction of cervical region: Secondary | ICD-10-CM | POA: Diagnosis not present

## 2019-01-09 DIAGNOSIS — M5417 Radiculopathy, lumbosacral region: Secondary | ICD-10-CM | POA: Diagnosis not present

## 2019-01-09 DIAGNOSIS — M9903 Segmental and somatic dysfunction of lumbar region: Secondary | ICD-10-CM | POA: Diagnosis not present

## 2019-01-09 DIAGNOSIS — M542 Cervicalgia: Secondary | ICD-10-CM | POA: Diagnosis not present

## 2019-01-16 ENCOUNTER — Ambulatory Visit (INDEPENDENT_AMBULATORY_CARE_PROVIDER_SITE_OTHER): Payer: Medicare Other | Admitting: *Deleted

## 2019-01-16 ENCOUNTER — Other Ambulatory Visit: Payer: Self-pay

## 2019-01-16 DIAGNOSIS — Z86718 Personal history of other venous thrombosis and embolism: Secondary | ICD-10-CM

## 2019-01-16 DIAGNOSIS — I4819 Other persistent atrial fibrillation: Secondary | ICD-10-CM

## 2019-01-16 LAB — POCT INR: INR: 3.9 — AB (ref 2.0–3.0)

## 2019-01-16 NOTE — Patient Instructions (Signed)
Description   Hold today, then continue taking 1 tablet daily except for 1/2 a tablet on Mondays. Recheck in 3 weeks. Call us with any medication changes or concerns. [facility] # [phone] office.

## 2019-01-24 ENCOUNTER — Other Ambulatory Visit: Payer: Self-pay | Admitting: Cardiology

## 2019-02-04 ENCOUNTER — Ambulatory Visit (INDEPENDENT_AMBULATORY_CARE_PROVIDER_SITE_OTHER): Payer: Medicare Other | Admitting: *Deleted

## 2019-02-04 ENCOUNTER — Other Ambulatory Visit: Payer: Self-pay

## 2019-02-04 DIAGNOSIS — I4819 Other persistent atrial fibrillation: Secondary | ICD-10-CM

## 2019-02-04 DIAGNOSIS — Z86718 Personal history of other venous thrombosis and embolism: Secondary | ICD-10-CM | POA: Diagnosis not present

## 2019-02-04 LAB — POCT INR: INR: 3.5 — AB (ref 2.0–3.0)

## 2019-02-04 NOTE — Patient Instructions (Addendum)
Description   Hold today's dose, then start taking 1 tablet daily except for 1/2 tablet on Mondays and Fridays. Recheck in 3 weeks. Call us with any medication changes or concerns. [facility] # [phone] office.

## 2019-02-13 ENCOUNTER — Encounter: Payer: Self-pay | Admitting: Cardiology

## 2019-02-13 ENCOUNTER — Ambulatory Visit (INDEPENDENT_AMBULATORY_CARE_PROVIDER_SITE_OTHER): Payer: Medicare Other | Admitting: Cardiology

## 2019-02-13 ENCOUNTER — Other Ambulatory Visit: Payer: Self-pay

## 2019-02-13 VITALS — BP 124/58 | HR 59 | Ht 72.0 in | Wt 230.0 lb

## 2019-02-13 DIAGNOSIS — I25119 Atherosclerotic heart disease of native coronary artery with unspecified angina pectoris: Secondary | ICD-10-CM | POA: Diagnosis not present

## 2019-02-13 DIAGNOSIS — Z7901 Long term (current) use of anticoagulants: Secondary | ICD-10-CM

## 2019-02-13 DIAGNOSIS — I5042 Chronic combined systolic (congestive) and diastolic (congestive) heart failure: Secondary | ICD-10-CM | POA: Diagnosis not present

## 2019-02-13 DIAGNOSIS — E782 Mixed hyperlipidemia: Secondary | ICD-10-CM

## 2019-02-13 DIAGNOSIS — Z952 Presence of prosthetic heart valve: Secondary | ICD-10-CM | POA: Diagnosis not present

## 2019-02-13 DIAGNOSIS — I4819 Other persistent atrial fibrillation: Secondary | ICD-10-CM | POA: Diagnosis not present

## 2019-02-13 DIAGNOSIS — Z950 Presence of cardiac pacemaker: Secondary | ICD-10-CM

## 2019-02-13 MED ORDER — FUROSEMIDE 20 MG PO TABS: ORAL_TABLET | ORAL | 1 refills | Status: DC

## 2019-02-13 NOTE — Addendum Note (Signed)
Addended by: Particia Nearing B on: 02/13/2019 03:44 PM   Modules accepted: Orders

## 2019-02-13 NOTE — Patient Instructions (Signed)
Medication Instructions:  Your physician has recommended you make the following change in your medication:   INCREASE: Furosemide 20 mg daily except take an additional tab( 20 mg) two days a week.  *If you need a refill on your cardiac medications before your next appointment, please call your pharmacy*  Lab Work: Your physician recommends that you return for lab work in: TODAY BMP,Pro BNP  If you have labs (blood work) drawn today and your tests are completely normal, you will receive your results only by: Marland Kitchen MyChart Message (if you have MyChart) OR . A paper copy in the mail If you have any lab test that is abnormal or we need to change your treatment, we will call you to review the results.  Testing/Procedures: Your physician has requested that you have an echocardiogram. Echocardiography is a painless test that uses sound waves to create images of your heart. It provides your doctor with information about the size and shape of your heart and how well your heart's chambers and valves are working. This procedure takes approximately one hour. There are no restrictions for this procedure.    Follow-Up: At [facility], you and your health needs are our priority.  As part of our continuing mission to provide you with exceptional heart care, we have created designated Provider Care Teams.  These Care Teams include your primary Cardiologist (physician) and Advanced Practice Providers (APPs -  Physician Assistants and Nurse Practitioners) who all work together to provide you with the care you need, when you need it.  Your next appointment:   6 month(s)  The format for your next appointment:   In Person  Provider:   Shirlee More, MD  Other Instructions

## 2019-02-13 NOTE — Progress Notes (Signed)
Cardiology Office Note:    Date:  02/13/2019   ID:  [name], DOB 1931-10-03, MRN [id number]  PCP:  Shirline Frees, MD  Cardiologist:  Shirlee More, MD    Referring MD: Shirline Frees, MD    ASSESSMENT:    1. S/P TAVR (transcatheter aortic valve replacement)   2. Coronary artery disease involving native coronary artery of native heart with angina pectoris (HCC)   3. Persistent atrial fibrillation ([place])   4. Long term (current) use of anticoagulants   5. Chronic combined systolic and diastolic heart failure (HCC)   6. Cardiac pacemaker in situ   7. Mixed hyperlipidemia    PLAN:    In order of problems listed above:  1. Stable has had a very nice objective response approaching the 1 year anniversary we will do an echocardiogram at the [place] office to assess TAVR function. 2. Stable CAD he has had no anginal discomfort since PCI and stent continue medical therapy at this time does not need an ischemia evaluation 3. Stable we will continue warfarin anticoagulation goal INR 2.5 4. Heart failure is mildly decompensated fluid overloaded increase his loop diuretic check proBNP and renal function with CKD 5. Stable dyslipidemia tolerates his statin without muscle weakness or pain lipids are ideal with an LDL 44 cholesterol 87 HDL 26 and normal liver function 11/18/2018 continue his statin   Next appointment: 6 months   Medication Adjustments/Labs and Tests Ordered: Current medicines are reviewed at length with the patient today.  Concerns regarding medicines are outlined above.  No orders of the defined types were placed in this encounter.  No orders of the defined types were placed in this encounter.   Chief Complaint  Patient presents with  . Follow-up    After TAVR    History of Present Illness:    [name] is a 86 y.o. male with a hx of CAD s/p PCI with DES to RCA 03/18/18, severe AS s/p TAVR 03/19/18, persistent atrial fibrillation  anticoagulated on Warfarin, recurrent DVT/PE s/p IVC filter, HTN, combined heart failure, and a permanent pacemaker.  I reviewed his last pacemaker download 12/17/2018 he had normal device parameters and functions.  He is in persistent atrial fibrillation and is biventricular paced 98% of the time.  Projected battery remaining 95.5%.  He had echocardiogram performed 10/29/2018 with normal left and right ventricular size and function moderate left atrial enlargement.  Parameters status post TAVR peak and mean gradients 18 and 10 mmHg normal valve appearance structure and no valvular regurgitation. He was last seen 10/16/2018. Compliance with diet, lifestyle and medications: Yes except when he has doctors appointments another home he skips his diuretic  Overall he is done well his weight is stable but he does notice edema he has mild exertional dyspnea admits to missing his diuretic when he is out of the home.  We negotiated the days that when he skips in the morning he will double the next day and 2 days a week he will take an extra tablet of furosemide as he has mildly decompensated heart failure with peripheral edema.  He is markedly improved after PCI stenting TAVR and has had no chest pain orthopnea palpitation or syncope.  He has had some dizziness is not positional and has no shift in blood pressure with posture.  His chronic kidney disease and will recheck renal function proBNP in the office today.  I reviewed his most recent pacemaker download with the patient.  His  INR form January 25, 2019 was 2.5 Past Medical History:  Diagnosis Date  . Arthritis    "knees, right shoulder" (03/14/2017)  . Atrial fibrillation, persistent ([place]) 09/19/2014  . CAD (coronary artery disease), native coronary artery    Cath 2011 [facility] (08/2009):~ Proximal LAD 30%, mid to distal LAD 25%, ostial small D1 75% mid AV groove circumflex 99% been subtotal stenosis, proximal to mid RCA 25-30%, mid RCA 30%, mid PDA 30%, EF 50% with  inferior hypokinesis.;    July 2011  PCI and DES to circumflex Dr. Olevia Perches   ETT-Myoview (06/2013):  Inferolateral scar, mild peri-infarct ischemia, EF 40%; Intermediate Risk   ECHO EF 55% 03/2013   . Cardiac pacemaker in situ 03/14/2017   Biventricular St. Jude inserted 03/14/17 Dr. Lovena Le for second degree heart block   . CKD (chronic kidney disease), stage III   . GERD   . History of infection of prosthetic knee 04/05/2015   Treated with debridement and irrigation followed by 6 months of triple antibiotics  . History of kidney stones   . History of peptic ulcer 1970s?  Marland Kitchen History of prostate cancer    Radical prostatectomy in 2006 Dr. Terance Hart   . Hypertensive heart disease   . Long term (current) use of anticoagulants 10/06/2011  . Mixed hyperlipidemia   . Moderate persistent asthma without complication 99991111  . Obesity (BMI 30-39.9)   . OSA on CPAP   . 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)    Initial DVT in 2006 after prostate surgery and had [place] filter placed Bilateral  PE 2013 and placed back on warfarin DVT of right subclavian vein on doppler 10/2012 at time of knee infection   . Personal history of malignant neoplasm of prostate    Radical prostatectomy in 2006 Dr. Terance Hart    . Severe aortic stenosis   . Severe aortic stenosis 03/18/2018  . Urinary incontinence    MULTIPLE BLADDER SURGERIES - STATES NO URINARY SPHINCTER - PT'S UROLOGIST IS AT [facility]- DR. PETERSON  ( LAST VISIT WAS 09/15/11 )    Past Surgical History:  Procedure Laterality Date  . APPENDECTOMY    . BI-VENTRICULAR PACEMAKER INSERTION (CRT-P)  03/14/2017  . BIV PACEMAKER INSERTION CRT-P N/A 03/14/2017   Procedure: BIV PACEMAKER INSERTION CRT-P;  Surgeon: Evans Lance, MD;  Location: [facility];  Service: Cardiovascular;  Laterality: N/A;  . CARDIOVERSION N/A 10/08/2014   Procedure: CARDIOVERSION;  Surgeon: Jacolyn Reedy, MD;  Location: [facility];  Service:  Cardiovascular;  Laterality: N/A;  . CATARACT EXTRACTION W/ INTRAOCULAR LENS  IMPLANT, BILATERAL Bilateral   . CORONARY ANGIOPLASTY WITH STENT PLACEMENT  08/25/2009   DES-mid LCx 08/2009; 30% pLAD, 25% m/dLAD, 75% ostial D1, 99% mLCx s/p DES, 25-30% p/mRCA, 40% mRCA, 30% mPDA stenoses; LVEF 50%, inf hypokinesis  . CORONARY STENT INTERVENTION N/A 03/18/2018   Procedure: CORONARY STENT INTERVENTION;  Surgeon: Sherren Mocha, MD;  Location: [facility];  Service: Cardiovascular;  Laterality: N/A;  . EXCISIONAL HEMORRHOIDECTOMY    . I & D KNEE WITH POLY EXCHANGE Left 10/09/2012   Procedure: IRRIGATION AND DEBRIDEMENT LEFT KNEE WITH POLY REVISION;  Surgeon: Gearlean Alf, MD;  Location: [facility];  Service: Orthopedics;  Laterality: Left;  . INGUINAL HERNIA REPAIR Right   . INTRAOPERATIVE TRANSTHORACIC ECHOCARDIOGRAM  03/19/2018   Procedure: Intraoperative Transthoracic Echocardiogram;  Surgeon: Sherren Mocha, MD;  Location: [facility];  Service: Open Heart Surgery;;  . JOINT REPLACEMENT    .  KNEE ARTHROTOMY Left 10/09/2012   Procedure: LEFT KNEE ARTHROTOMY;  Surgeon: Gearlean Alf, MD;  Location: [facility];  Service: Orthopedics;  Laterality: Left;  . LEFT HEART CATH AND CORONARY ANGIOGRAPHY N/A 02/28/2018   Procedure: LEFT HEART CATH AND CORONARY ANGIOGRAPHY;  Surgeon: Sherren Mocha, MD;  Location: [facility];  Service: Cardiovascular;  Laterality: N/A;  . PROSTATECTOMY  04/25/2004  . REPLACEMENT TOTAL KNEE Bilateral   . SKIN BIOPSY     "off nose; wasn't cancer; it was tested" (03/14/2017)  . TRANSCATHETER AORTIC VALVE REPLACEMENT, TRANSFEMORAL N/A 03/19/2018   Procedure: TRANSCATHETER AORTIC VALVE REPLACEMENT, TRANSFEMORAL;  Surgeon: Sherren Mocha, MD;  Location: [facility];  Service: Open Heart Surgery;  Laterality: N/A;  . Uretheral implants     multiple for incontinence  . VENA CAVA FILTER PLACEMENT      Current Medications: Current Meds  Medication Sig  . amLODipine (NORVASC) 2.5 MG  tablet Take 2.5 mg by mouth every evening.   . Ascorbic Acid (VITAMIN C) 1000 MG tablet Take 1,000 mg by mouth daily.  Marland Kitchen aspirin EC 81 MG tablet Take 81 mg by mouth daily.  . Biotin 2500 MCG CAPS Take 2,500 mcg by mouth daily.  . cholecalciferol (VITAMIN D) 1000 UNITS tablet Take 1,000 Units by mouth daily.  Marland Kitchen docusate sodium (COLACE) 50 MG capsule Take 50 mg by mouth daily.  . fenofibrate 160 MG tablet TAKE 1 TABLET BY MOUTH EVERY DAY  . furosemide (LASIX) 20 MG tablet Take 20 mg by mouth daily.  . Glucosamine HCl (GLUCOSAMINE PO) Take 2,000 mg by mouth daily.  . Ibuprofen-diphenhydrAMINE Cit (ADVIL PM PO) Take 1 tablet by mouth daily.  . Multiple Vitamins-Minerals (ADULT GUMMY PO) Take 1 tablet by mouth daily.  . nitroGLYCERIN (NITROSTAT) 0.4 MG SL tablet Place 1 tablet (0.4 mg total) under the tongue every 5 (five) minutes as needed.  . Omega-3 Fatty Acids (FISH OIL) 1000 MG CAPS Take 1,000 mg by mouth daily.  . pantoprazole (PROTONIX) 40 MG tablet Take 1 tablet (40 mg total) by mouth daily.  . pravastatin (PRAVACHOL) 20 MG tablet Take 1 tablet (20 mg total) by mouth daily.  . vitamin B-12 (CYANOCOBALAMIN) 500 MCG tablet Take 500 mcg by mouth daily.  Marland Kitchen warfarin (COUMADIN) 2.5 MG tablet TAKE 1 TABLET BY MOUTH EVERY DAY     Allergies:   Darifenacin and Lisinopril   Social History   Socioeconomic History  . Marital status: Married    Spouse name: Not on file  . Number of children: 3  . Years of education: Not on file  . Highest education level: Not on file  Occupational History  . Occupation: Retired Therapist, nutritional  Tobacco Use  . Smoking status: Former Smoker    Packs/day: 1.00    Years: 10.00    Pack years: 10.00    Types: Cigarettes    Quit date: 04/27/1961    Years since quitting: 57.8  . Smokeless tobacco: Never Used  Substance and Sexual Activity  . Alcohol use: No    Alcohol/week: 0.0 standard drinks  . Drug use: No  . Sexual activity: Not on file  Other Topics  Concern  . Not on file  Social History Narrative  . Not on file   Social Determinants of Health   Financial Resource Strain:   . Difficulty of Paying Living Expenses: Not on file  Food Insecurity:   . Worried About Charity fundraiser in the Last Year: Not on file  .  Ran Out of Food in the Last Year: Not on file  Transportation Needs:   . Lack of Transportation (Medical): Not on file  . Lack of Transportation (Non-Medical): Not on file  Physical Activity:   . Days of Exercise per Week: Not on file  . Minutes of Exercise per Session: Not on file  Stress:   . Feeling of Stress : Not on file  Social Connections:   . Frequency of Communication with Friends and Family: Not on file  . Frequency of Social Gatherings with Friends and Family: Not on file  . Attends Religious Services: Not on file  . Active Member of Clubs or Organizations: Not on file  . Attends Archivist Meetings: Not on file  . Marital Status: Not on file     Family History: The patient's family history includes Melanoma in his brother and father. ROS:   Please see the history of present illness.    All other systems reviewed and are negative.  EKGs/Labs/Other Studies Reviewed:    The following studies were reviewed today:    Recent Labs: 03/20/2018: Magnesium 1.7 03/27/2018: BUN 30; Creatinine, Ser 1.64; Hemoglobin 10.3; Platelets 233; Potassium 4.9; Sodium 141  Recent Lipid Panel    Component Value Date/Time   CHOL 118 04/28/2011 1003   TRIG 85.0 04/28/2011 1003   HDL 34.90 (L) 04/28/2011 1003   CHOLHDL 3 04/28/2011 1003   VLDL 17.0 04/28/2011 1003   LDLCALC 66 04/28/2011 1003    Physical Exam:    VS:  BP (!) 124/58   Pulse (!) 59   Ht 6' (1.829 m)   Wt 230 lb (104.3 kg)   SpO2 98%   BMI 31.19 kg/m     Wt Readings from Last 3 Encounters:  02/13/19 230 lb (104.3 kg)  10/16/18 238 lb (108 kg)  07/17/18 232 lb (105.2 kg)    Standing blood pressure 02/25/1958 GEN:  Well nourished,  well developed in no acute distress HEENT: Normal NECK: No JVD; No carotid bruits LYMPHATICS: No lymphadenopathy CARDIAC: Irregular no murmur S1 variable RRR, no murmurs, rubs, gallops RESPIRATORY:  Clear to auscultation without rales, wheezing or rhonchi  ABDOMEN: Soft, non-tender, non-distended MUSCULOSKELETAL: Lateral ankle to knee 1-2+ pitting edema; No deformity  SKIN: Warm and dry NEUROLOGIC:  Alert and oriented x 3 PSYCHIATRIC:  Normal affect    Signed, Shirlee More, MD  02/13/2019 3:25 PM    [facility]

## 2019-02-14 ENCOUNTER — Telehealth: Payer: Self-pay | Admitting: Cardiology

## 2019-02-14 LAB — BASIC METABOLIC PANEL
BUN/Creatinine Ratio: 17 (ref 10–24)
BUN: 30 mg/dL — ABNORMAL HIGH (ref 8–27)
CO2: 21 mmol/L (ref 20–29)
Calcium: 10.4 mg/dL — ABNORMAL HIGH (ref 8.6–10.2)
Chloride: 111 mmol/L — ABNORMAL HIGH (ref 96–106)
Creatinine, Ser: 1.74 mg/dL — ABNORMAL HIGH (ref 0.76–1.27)
GFR calc Af Amer: 40 mL/min/{1.73_m2} — ABNORMAL LOW (ref >59)
GFR calc non Af Amer: 35 mL/min/{1.73_m2} — ABNORMAL LOW (ref >59)
Glucose: 96 mg/dL (ref 65–99)
Potassium: 4.9 mmol/L (ref 3.5–5.2)
Sodium: 146 mmol/L — ABNORMAL HIGH (ref 134–144)

## 2019-02-14 LAB — PRO B NATRIURETIC PEPTIDE: NT-Pro BNP: 1252 pg/mL — ABNORMAL HIGH (ref 0–486)

## 2019-02-14 NOTE — Telephone Encounter (Signed)
We are recommending the COVID-19 vaccine to all of our patients. Cardiac medications (including blood thinners) should not deter anyone from being vaccinated and there is no need to hold any of those medications prior to vaccine administration.     Currently, there is a hotline to call (active 02/14/19) to schedule vaccination appointments as no walk-ins will be accepted.   Number: [phone]    If you have further questions or concerns about the vaccine process, please visit [email or web address] or contact your primary care physician.  I have informed patient of instructions.         

## 2019-02-20 DIAGNOSIS — M5417 Radiculopathy, lumbosacral region: Secondary | ICD-10-CM | POA: Diagnosis not present

## 2019-02-20 DIAGNOSIS — M9901 Segmental and somatic dysfunction of cervical region: Secondary | ICD-10-CM | POA: Diagnosis not present

## 2019-02-20 DIAGNOSIS — M62830 Muscle spasm of back: Secondary | ICD-10-CM | POA: Diagnosis not present

## 2019-02-20 DIAGNOSIS — M9903 Segmental and somatic dysfunction of lumbar region: Secondary | ICD-10-CM | POA: Diagnosis not present

## 2019-02-20 DIAGNOSIS — M542 Cervicalgia: Secondary | ICD-10-CM | POA: Diagnosis not present

## 2019-02-25 ENCOUNTER — Other Ambulatory Visit: Payer: Self-pay

## 2019-02-25 ENCOUNTER — Ambulatory Visit (INDEPENDENT_AMBULATORY_CARE_PROVIDER_SITE_OTHER): Payer: Medicare Other | Admitting: *Deleted

## 2019-02-25 ENCOUNTER — Ambulatory Visit: Payer: Medicare Other | Attending: Internal Medicine

## 2019-02-25 DIAGNOSIS — Z23 Encounter for immunization: Secondary | ICD-10-CM | POA: Insufficient documentation

## 2019-02-25 DIAGNOSIS — I4819 Other persistent atrial fibrillation: Secondary | ICD-10-CM

## 2019-02-25 DIAGNOSIS — Z86718 Personal history of other venous thrombosis and embolism: Secondary | ICD-10-CM

## 2019-02-25 DIAGNOSIS — Z5181 Encounter for therapeutic drug level monitoring: Secondary | ICD-10-CM

## 2019-02-25 LAB — POCT INR: INR: 2.6 (ref 2.0–3.0)

## 2019-02-25 NOTE — Progress Notes (Signed)
   Covid-19 Vaccination Clinic  Name:  [name]    MRN: [id number] DOB: 10/09/1931  02/25/2019  [name] was observed post Covid-19 immunization for 15 minutes without incidence. He was provided with Vaccine Information Sheet and instruction to access the V-Safe system.   [name] was instructed to call 911 with any severe reactions post vaccine: Marland Kitchen Difficulty breathing  . Swelling of your face and throat  . A fast heartbeat  . A bad rash all over your body  . Dizziness and weakness    Immunizations Administered    Name Date Dose VIS Date Route   Pfizer COVID-19 Vaccine 02/25/2019  1:15 PM 0.3 mL 01/17/2019 Intramuscular   Manufacturer: [place]   Lot: [id number]   [place]: [id number]

## 2019-02-25 NOTE — Patient Instructions (Signed)
Description   Continue taking 1 tablet daily except for 1/2 tablet on Mondays and Fridays. Recheck in 4 weeks. Call us with any medication changes or concerns. [facility] # [phone] office.

## 2019-03-12 ENCOUNTER — Other Ambulatory Visit: Payer: Self-pay | Admitting: Physician Assistant

## 2019-03-13 DIAGNOSIS — C44219 Basal cell carcinoma of skin of left ear and external auricular canal: Secondary | ICD-10-CM | POA: Diagnosis not present

## 2019-03-13 DIAGNOSIS — L570 Actinic keratosis: Secondary | ICD-10-CM | POA: Diagnosis not present

## 2019-03-13 DIAGNOSIS — D485 Neoplasm of uncertain behavior of skin: Secondary | ICD-10-CM | POA: Diagnosis not present

## 2019-03-13 DIAGNOSIS — C44319 Basal cell carcinoma of skin of other parts of face: Secondary | ICD-10-CM | POA: Diagnosis not present

## 2019-03-13 DIAGNOSIS — C4441 Basal cell carcinoma of skin of scalp and neck: Secondary | ICD-10-CM | POA: Diagnosis not present

## 2019-03-13 DIAGNOSIS — Z23 Encounter for immunization: Secondary | ICD-10-CM | POA: Diagnosis not present

## 2019-03-13 DIAGNOSIS — L820 Inflamed seborrheic keratosis: Secondary | ICD-10-CM | POA: Diagnosis not present

## 2019-03-17 NOTE — Progress Notes (Addendum)
[facility] [place]                                       Cardiology Office Note    Date:  03/20/2019   ID:  Jacek P Kimbley, DOB January 08, 1932, MRN [id number]  PCP:  Shirline Frees, MD  Cardiologist:  Dr. Bettina Gavia / Dr. Burt Knack &Dr. Roxy Manns (TAVR)  CC: 1 year s/p TAVR  History of Present Illness:  [name] is a 86 y.o. male with a history of CAD, persistent afib on Coumadin,recurrent DVT/PE s/p IVC filter, HTN, chronic combined S/D CHF, prostate CA s/p radical prostatectomy, chronic urinary incontinence, CAD s/p PCI (03/18/18) and severe AS s/p TAVR (03/19/18) who presents to clinic for follow up.   He was referred to the structural heart team last year for severe aortic stenosis. Pre TAVR cath showed severe multivessel coronary artery disease including high-grade stenosis of the distal left circumflex coronary artery in the right coronary artery.  He underwent successfulPCI of severe stenosis throughout the proximal and mid RCA using overlapping drug-eluting stents on 2/10 followed by successful TAVR with a72mm Edwards Sapien 3 THV via the TF approach on 03/19/18. Post operative echoshowed EF 55%, normally functioning TAVR valve with a mean gradient of 59mm Hg and no PVL. He had failure of perclose device on right side. Hg dropped 15.8-->9.1. Non contrast CT 2/13 showed a right retroperitoneal hematoma 6.8 x 8.4 x 16.5 cm. Coumadin was held and he was discharged home on ASA and plavix.  Hospital course was also complicated by post operative hypotension and AKI (creat up to 2.11) and lasix was held.   He has done well in follow up with resolution of hematoma.   Today he presents to clinic for follow up. He is doing well. Has some shortness of breath with mild-mod activity such as walking the dog and over exerting. He feels much better since having his TAVR. No CP. No LE edema, orthopnea or PND. No dizziness or syncope. No blood in stool or  urine. No palpitations.      Past Medical History:  Diagnosis Date  . Arthritis    "knees, right shoulder" (03/14/2017)  . Atrial fibrillation, persistent ([place]) 09/19/2014  . CAD (coronary artery disease), native coronary artery    Cath 2011 [facility] (08/2009):~ Proximal LAD 30%, mid to distal LAD 25%, ostial small D1 75% mid AV groove circumflex 99% been subtotal stenosis, proximal to mid RCA 25-30%, mid RCA 30%, mid PDA 30%, EF 50% with inferior hypokinesis.;    July 2011  PCI and DES to circumflex Dr. Olevia Perches   ETT-Myoview (06/2013):  Inferolateral scar, mild peri-infarct ischemia, EF 40%; Intermediate Risk   ECHO EF 55% 03/2013   . Cardiac pacemaker in situ 03/14/2017   Biventricular St. Jude inserted 03/14/17 Dr. Lovena Le for second degree heart block   . CKD (chronic kidney disease), stage III   . GERD   . History of infection of prosthetic knee 04/05/2015   Treated with debridement and irrigation followed by 6 months of triple antibiotics  . History of kidney stones   . History of peptic ulcer 1970s?  Marland Kitchen History of prostate cancer    Radical prostatectomy in 2006 Dr. Terance Hart   . Hypertensive heart disease   . Long term (current) use of anticoagulants 10/06/2011  . Mixed hyperlipidemia   . Moderate persistent asthma  without complication [id number]  . Obesity (BMI 30-39.9)   . OSA on CPAP   . 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)    Initial DVT in 2006 after prostate surgery and had [place] filter placed Bilateral  PE 2013 and placed back on warfarin DVT of right subclavian vein on doppler 10/2012 at time of knee infection   . Personal history of malignant neoplasm of prostate    Radical prostatectomy in 2006 Dr. Terance Hart    . Severe aortic stenosis   . Severe aortic stenosis 03/18/2018  . Urinary incontinence    MULTIPLE BLADDER SURGERIES - STATES NO URINARY SPHINCTER - PT'S UROLOGIST IS AT [facility]- DR. PETERSON  ( LAST VISIT WAS 09/15/11 )    Past Surgical  History:  Procedure Laterality Date  . APPENDECTOMY    . BI-VENTRICULAR PACEMAKER INSERTION (CRT-P)  03/14/2017  . BIV PACEMAKER INSERTION CRT-P N/A 03/14/2017   Procedure: BIV PACEMAKER INSERTION CRT-P;  Surgeon: Evans Lance, MD;  Location: [facility];  Service: Cardiovascular;  Laterality: N/A;  . CARDIOVERSION N/A 10/08/2014   Procedure: CARDIOVERSION;  Surgeon: Jacolyn Reedy, MD;  Location: [facility];  Service: Cardiovascular;  Laterality: N/A;  . CATARACT EXTRACTION W/ INTRAOCULAR LENS  IMPLANT, BILATERAL Bilateral   . CORONARY ANGIOPLASTY WITH STENT PLACEMENT  08/25/2009   DES-mid LCx 08/2009; 30% pLAD, 25% m/dLAD, 75% ostial D1, 99% mLCx s/p DES, 25-30% p/mRCA, 40% mRCA, 30% mPDA stenoses; LVEF 50%, inf hypokinesis  . CORONARY STENT INTERVENTION N/A 03/18/2018   Procedure: CORONARY STENT INTERVENTION;  Surgeon: Sherren Mocha, MD;  Location: [place] [facility];  Service: Cardiovascular;  Laterality: N/A;  . EXCISIONAL HEMORRHOIDECTOMY    . I & D KNEE WITH POLY EXCHANGE Left 10/09/2012   Procedure: IRRIGATION AND DEBRIDEMENT LEFT KNEE WITH POLY REVISION;  Surgeon: Gearlean Alf, MD;  Location: [facility];  Service: Orthopedics;  Laterality: Left;  . INGUINAL HERNIA REPAIR Right   . INTRAOPERATIVE TRANSTHORACIC ECHOCARDIOGRAM  03/19/2018   Procedure: Intraoperative Transthoracic Echocardiogram;  Surgeon: Sherren Mocha, MD;  Location: [facility];  Service: Open Heart Surgery;;  . JOINT REPLACEMENT    . KNEE ARTHROTOMY Left 10/09/2012   Procedure: LEFT KNEE ARTHROTOMY;  Surgeon: Gearlean Alf, MD;  Location: [facility];  Service: Orthopedics;  Laterality: Left;  . LEFT HEART CATH AND CORONARY ANGIOGRAPHY N/A 02/28/2018   Procedure: LEFT HEART CATH AND CORONARY ANGIOGRAPHY;  Surgeon: Sherren Mocha, MD;  Location: [facility];  Service: Cardiovascular;  Laterality: N/A;  . PROSTATECTOMY  04/25/2004  . REPLACEMENT TOTAL KNEE Bilateral   . SKIN BIOPSY     "off nose; wasn't cancer;  it was tested" (03/14/2017)  . TRANSCATHETER AORTIC VALVE REPLACEMENT, TRANSFEMORAL N/A 03/19/2018   Procedure: TRANSCATHETER AORTIC VALVE REPLACEMENT, TRANSFEMORAL;  Surgeon: Sherren Mocha, MD;  Location: [facility];  Service: Open Heart Surgery;  Laterality: N/A;  . Uretheral implants     multiple for incontinence  . VENA CAVA FILTER PLACEMENT      Current Medications: Outpatient Medications Prior to Visit  Medication Sig Dispense Refill  . amLODipine (NORVASC) 2.5 MG tablet Take 2.5 mg by mouth every evening.     . Ascorbic Acid (VITAMIN C) 1000 MG tablet Take 1,000 mg by mouth daily.    . Biotin 2500 MCG CAPS Take 2,500 mcg by mouth daily.    . cholecalciferol (VITAMIN D) 1000 UNITS tablet Take 1,000 Units by mouth daily.    Marland Kitchen docusate  sodium (COLACE) 50 MG capsule Take 50 mg by mouth daily.    . fenofibrate 160 MG tablet TAKE 1 TABLET BY MOUTH EVERY DAY 90 tablet 1  . furosemide (LASIX) 20 MG tablet Take 1 tab (20 mg) daily except take ann additional dose on  Twice a week 90 tablet 1  . Glucosamine HCl (GLUCOSAMINE PO) Take 2,000 mg by mouth daily.    . Ibuprofen-diphenhydrAMINE Cit (ADVIL PM PO) Take 1 tablet by mouth daily.    . Multiple Vitamins-Minerals (ADULT GUMMY PO) Take 1 tablet by mouth daily.    . nitroGLYCERIN (NITROSTAT) 0.4 MG SL tablet Place 1 tablet (0.4 mg total) under the tongue every 5 (five) minutes as needed. 25 tablet 2  . Omega-3 Fatty Acids (FISH OIL) 1000 MG CAPS Take 1,000 mg by mouth daily.    . pantoprazole (PROTONIX) 40 MG tablet TAKE ONE TABLET BY MOUTH DAILY 90 tablet 3  . pravastatin (PRAVACHOL) 20 MG tablet Take 1 tablet (20 mg total) by mouth daily. 90 tablet 1  . vitamin B-12 (CYANOCOBALAMIN) 500 MCG tablet Take 500 mcg by mouth daily.    Marland Kitchen warfarin (COUMADIN) 2.5 MG tablet TAKE 1 TABLET BY MOUTH EVERY DAY 90 tablet 1  . aspirin EC 81 MG tablet Take 81 mg by mouth daily.     No facility-administered medications prior to visit.     Allergies:    Darifenacin and Lisinopril   Social History   Socioeconomic History  . Marital status: Married    Spouse name: Not on file  . Number of children: 3  . Years of education: Not on file  . Highest education level: Not on file  Occupational History  . Occupation: Retired Therapist, nutritional  Tobacco Use  . Smoking status: Former Smoker    Packs/day: 1.00    Years: 10.00    Pack years: 10.00    Types: Cigarettes    Quit date: 04/27/1961    Years since quitting: 57.9  . Smokeless tobacco: Never Used  Substance and Sexual Activity  . Alcohol use: No    Alcohol/week: 0.0 standard drinks  . Drug use: No  . Sexual activity: Not on file  Other Topics Concern  . Not on file  Social History Narrative  . Not on file   Social Determinants of Health   Financial Resource Strain:   . Difficulty of Paying Living Expenses: Not on file  Food Insecurity:   . Worried About Charity fundraiser in the Last Year: Not on file  . Ran Out of Food in the Last Year: Not on file  Transportation Needs:   . Lack of Transportation (Medical): Not on file  . Lack of Transportation (Non-Medical): Not on file  Physical Activity:   . Days of Exercise per Week: Not on file  . Minutes of Exercise per Session: Not on file  Stress:   . Feeling of Stress : Not on file  Social Connections:   . Frequency of Communication with Friends and Family: Not on file  . Frequency of Social Gatherings with Friends and Family: Not on file  . Attends Religious Services: Not on file  . Active Member of Clubs or Organizations: Not on file  . Attends Archivist Meetings: Not on file  . Marital Status: Not on file     Family History:  The patient's family history includes Melanoma in his brother and father.     ROS:   Please see the history of  present illness.    ROS All other systems reviewed and are negative.   PHYSICAL EXAM:   VS:  BP 140/80   Pulse 60   Ht 6' (1.829 m)   Wt 238 lb (108 kg)   BMI 32.28  kg/m    GEN: Well nourished, well developed, in no acute distress, overweight HEENT: normal Neck: no JVD or masses Cardiac: RRR; no murmurs, rubs, or gallops,no edema  Respiratory:  clear to auscultation bilaterally, normal work of breathing GI: soft, nontender, nondistended, + BS MS: no deformity or atrophy Skin: warm and dry, no rash Neuro:  Alert and Oriented x 3, Strength and sensation are intact Psych: euthymic mood, full affect   Wt Readings from Last 3 Encounters:  03/19/19 238 lb (108 kg)  02/13/19 230 lb (104.3 kg)  10/16/18 238 lb (108 kg)      Studies/Labs Reviewed:   EKG:  EKG is NOT ordered today.    Recent Labs: 03/27/2018: Hemoglobin 10.3; Platelets 233 02/13/2019: BUN 30; Creatinine, Ser 1.74; NT-Pro BNP 1,252; Potassium 4.9; Sodium 146   Lipid Panel    Component Value Date/Time   CHOL 118 04/28/2011 1003   TRIG 85.0 04/28/2011 1003   HDL 34.90 (L) 04/28/2011 1003   CHOLHDL 3 04/28/2011 1003   VLDL 17.0 04/28/2011 1003   LDLCALC 66 04/28/2011 1003    Additional studies/ records that were reviewed today include:   TAVR OPERATIVE NOTE Date of Procedure:03/19/2018  Preoperative Diagnosis:Severe Aortic Stenosis   Postoperative Diagnosis:Same   Procedure:   Transcatheter Aortic Valve Replacement - PercutaneousRightTransfemoral Approach Edwards Sapien 3 THV (size 72mm, model # 9600TFX, serial # [id number])  Co-Surgeons:Clarence H. Roxy Manns, MD and Sherren Mocha, MD  Anesthesiologist:William Therisa Doyne, MD  Echocardiographer:Mihai Croitoru, MD  Pre-operative Echo Findings: ? Severe aortic stenosis ? Moderateleft ventricular systolic dysfunction  Post-operative Echo Findings: ? Noparavalvular leak ? Unchangedleft ventricular systolic function  ______________   Echo 03/20/18: IMPRESSIONS 1. The left ventricle has normal  systolic function, with an ejection fraction of 55-60%. The cavity size was normal. There is moderately increased left ventricular wall thickness. Left ventricular diastology could not be evaluated secondary to atrial  fibrillation. 2. The right ventricle has normal systolic function. The cavity was mildly enlarged. There is no increase in right ventricular wall thickness. 3. Left atrial size was moderately dilated. 4. Right atrial size was mildly dilated. 5. The mitral valve is normal in structure. There is mild thickening. There is mild mitral annular calcification present. 6. The tricuspid valve is normal in structure. 7. S/p TAVR. There is no aortic insufficiency. 8. 9. The pulmonic valve was normal in structure. 10. Right atrial pressure is estimated at 10 mmHg. 11. The interatrial septum was not assessed.  ______________   Echo 03/19/19 IMPRESSIONS  1. 1 year post TAVR, normal transaortic peak/mean gradients 18/9 mmHg, no paravalvular leak.  2. Left ventricular ejection fraction, by estimation, is 50 to 55%. The  left ventricle has low normal function. The left ventrical has no regional wall motion abnormalities. Left ventricular diastolic function could not be evaluated.  3. Right ventricular systolic function is normal. The right ventricular  size is normal. There is normal pulmonary artery systolic pressure.  4. Left atrial size was moderately dilated. No left atrial/left atrial  appendage thrombus was detected.  5. Right atrial size was mildly dilated.  6. The mitral valve is normal in structure and function. no evidence of mitral valve regurgitation. No evidence of mitral stenosis.  7. No  paravalvular leak.. The aortic valve has been repaired/replaced.  Aortic valve regurgitation is not visualized. No aortic stenosis is  present. 32mm valve is present in the aortic position. Echo findings show  normal structure and function of the  aortic prosthesis.  8. The  inferior vena cava is normal in size with greater than 50%  respiratory variability, suggesting right atrial pressure of 3 mmHg.   ASSESSMENT & PLAN:   Severe AS s/p TAVR: echo today shows EF 50%, normally functioning TAVR with a mean gradient of 9 mm Hg and no PVL. He has NYHA class II symptoms. SBE prophylaxis discussed; he gets abx from the dentist. He has been on aspirin and coumadin. He can stop aspirin at this time.    CAD: s/p successfulPCI of severe stenosis throughout the proximal and mid RCA using overlapping drug-eluting stents. On coumadin and asprin. I discussed case with Dr. Burt Knack and we plan to stop aspirin at this time.   Recurrent DVT/PE: continue on coumadin   Chronic afib/flutter: rate well controlled today. Continue coumadin. INR check today  S/p PPM: followed by Dr. Lovena Le   Medication Adjustments/Labs and Tests Ordered: Current medicines are reviewed at length with the patient today.  Concerns regarding medicines are outlined above.  Medication changes, Labs and Tests ordered today are listed in the Patient Instructions below. Patient Instructions  Medication Instructions:  Your provider recommends that you continue on your current medications as directed, except stop aspirin. Please refer to the Current Medication list given to you today.     Follow-Up: You have an appointment with Dr. Bettina Gavia 07/14/2019 at 1:15PM.    Signed, Angelena Form, PA-C  03/20/2019 8:09 AM    [address], [place], [place]  [address] Phone: [phone]; Fax: [phone]

## 2019-03-18 ENCOUNTER — Ambulatory Visit (INDEPENDENT_AMBULATORY_CARE_PROVIDER_SITE_OTHER): Payer: Medicare Other | Admitting: *Deleted

## 2019-03-18 ENCOUNTER — Ambulatory Visit: Payer: Medicare Other | Attending: Internal Medicine

## 2019-03-18 DIAGNOSIS — Z23 Encounter for immunization: Secondary | ICD-10-CM | POA: Insufficient documentation

## 2019-03-18 DIAGNOSIS — I5042 Chronic combined systolic (congestive) and diastolic (congestive) heart failure: Secondary | ICD-10-CM | POA: Diagnosis not present

## 2019-03-18 LAB — CUP PACEART REMOTE DEVICE CHECK
Battery Remaining Longevity: 80 mo
Battery Remaining Percentage: 95.5 %
Battery Voltage: 2.98 V
Brady Statistic AP VP Percent: 49 %
Brady Statistic AP VS Percent: 1 %
Brady Statistic AS VP Percent: 50 %
Brady Statistic AS VS Percent: 1 %
Brady Statistic RA Percent Paced: 9 %
Date Time Interrogation Session: 20210209020022
Implantable Lead Implant Date: 20190206
Implantable Lead Implant Date: 20190206
Implantable Lead Implant Date: 20190206
Implantable Lead Location: 753858
Implantable Lead Location: 753859
Implantable Lead Location: 753860
Implantable Pulse Generator Implant Date: 20190206
Lead Channel Impedance Value: 410 Ohm
Lead Channel Impedance Value: 480 Ohm
Lead Channel Impedance Value: 980 Ohm
Lead Channel Pacing Threshold Amplitude: 0.375 V
Lead Channel Pacing Threshold Amplitude: 0.625 V
Lead Channel Pacing Threshold Amplitude: 2.75 V
Lead Channel Pacing Threshold Pulse Width: 0.5 ms
Lead Channel Pacing Threshold Pulse Width: 0.5 ms
Lead Channel Pacing Threshold Pulse Width: 0.8 ms
Lead Channel Sensing Intrinsic Amplitude: 11.5 mV
Lead Channel Sensing Intrinsic Amplitude: 3.1 mV
Lead Channel Setting Pacing Amplitude: 1.375
Lead Channel Setting Pacing Amplitude: 2 V
Lead Channel Setting Pacing Amplitude: 3 V
Lead Channel Setting Pacing Pulse Width: 0.5 ms
Lead Channel Setting Pacing Pulse Width: 0.8 ms
Lead Channel Setting Sensing Sensitivity: 2 mV
Pulse Gen Model: 3262
Pulse Gen Serial Number: 8995812

## 2019-03-18 NOTE — Progress Notes (Signed)
   Covid-19 Vaccination Clinic  Name:  [name]    MRN: [id number] DOB: 09/14/1931  03/18/2019  [name] was observed post Covid-19 immunization for 15 minutes without incidence. He was provided with Vaccine Information Sheet and instruction to access the V-Safe system.   [name] was instructed to call 911 with any severe reactions post vaccine: Marland Kitchen Difficulty breathing  . Swelling of your face and throat  . A fast heartbeat  . A bad rash all over your body  . Dizziness and weakness    Immunizations Administered    Name Date Dose VIS Date Route   Pfizer COVID-19 Vaccine 03/18/2019  2:21 PM 0.3 mL 01/17/2019 Intramuscular   Manufacturer: [place]   Lot: [id number]   [place]: [id number]

## 2019-03-19 ENCOUNTER — Ambulatory Visit (INDEPENDENT_AMBULATORY_CARE_PROVIDER_SITE_OTHER): Payer: Medicare Other | Admitting: *Deleted

## 2019-03-19 ENCOUNTER — Ambulatory Visit (HOSPITAL_COMMUNITY): Payer: Medicare Other | Attending: Internal Medicine

## 2019-03-19 ENCOUNTER — Other Ambulatory Visit: Payer: Self-pay

## 2019-03-19 ENCOUNTER — Ambulatory Visit (INDEPENDENT_AMBULATORY_CARE_PROVIDER_SITE_OTHER): Payer: Medicare Other | Admitting: Physician Assistant

## 2019-03-19 VITALS — BP 140/80 | HR 60 | Ht 72.0 in | Wt 238.0 lb

## 2019-03-19 DIAGNOSIS — Z5181 Encounter for therapeutic drug level monitoring: Secondary | ICD-10-CM

## 2019-03-19 DIAGNOSIS — I4819 Other persistent atrial fibrillation: Secondary | ICD-10-CM | POA: Diagnosis not present

## 2019-03-19 DIAGNOSIS — Z952 Presence of prosthetic heart valve: Secondary | ICD-10-CM | POA: Insufficient documentation

## 2019-03-19 DIAGNOSIS — Z950 Presence of cardiac pacemaker: Secondary | ICD-10-CM

## 2019-03-19 DIAGNOSIS — Z86718 Personal history of other venous thrombosis and embolism: Secondary | ICD-10-CM | POA: Diagnosis not present

## 2019-03-19 DIAGNOSIS — I25119 Atherosclerotic heart disease of native coronary artery with unspecified angina pectoris: Secondary | ICD-10-CM | POA: Diagnosis not present

## 2019-03-19 LAB — POCT INR: INR: 2 (ref 2.0–3.0)

## 2019-03-19 NOTE — Patient Instructions (Signed)
Description   Continue taking 1 tablet daily except for 1/2 tablet on Mondays and Fridays. Recheck in 4 weeks. Call us with any medication changes or concerns. Coumadin Clinic # [phone] office.

## 2019-03-19 NOTE — Patient Instructions (Addendum)
Medication Instructions:  Your provider recommends that you continue on your current medications as directed, except stop aspirin. Please refer to the Current Medication list given to you today.     Follow-Up: You have an appointment with Dr. Bettina Gavia 07/14/2019 at 1:15PM.

## 2019-03-19 NOTE — Progress Notes (Signed)
PPM Remote  

## 2019-03-20 ENCOUNTER — Other Ambulatory Visit (HOSPITAL_COMMUNITY): Payer: Medicare Other

## 2019-03-20 ENCOUNTER — Encounter (HOSPITAL_COMMUNITY): Payer: Self-pay

## 2019-03-20 ENCOUNTER — Ambulatory Visit: Payer: Medicare Other | Admitting: Physician Assistant

## 2019-03-20 ENCOUNTER — Telehealth: Payer: Self-pay

## 2019-03-20 NOTE — Addendum Note (Signed)
Addended by: Eileen Stanford on: 03/20/2019 08:09 AM   Modules accepted: Orders

## 2019-03-20 NOTE — Telephone Encounter (Signed)
Per Nell Range, called patient and instructed him to [place]. Also informed him of his great echo results.  He states his biopsies came back yesterday afternoon and he will need surgery. He understands his dermatologist will need to send clearance request (if they feel necessary for procedure). He was grateful for assistance.

## 2019-03-22 ENCOUNTER — Other Ambulatory Visit: Payer: Self-pay | Admitting: Physician Assistant

## 2019-03-31 ENCOUNTER — Telehealth: Payer: Self-pay | Admitting: Cardiology

## 2019-03-31 NOTE — Telephone Encounter (Signed)
Patient called today and states he will be having two moles removed at the [facility] on 04-14-19 and 04-28-19.  As a Coumadin patient, the [facility] will need to have the patient get his coumadin checked on 04-04-19 and have the results faxed to the [facility] at [phone].   The patient is aware that this is outside of his normal coumadin check, so he was not sure if he needed to cancel his regular coumadin check on 04-17-19 or not.  The [facility] can be reached at [phone] if any other information is needed

## 2019-03-31 NOTE — Telephone Encounter (Signed)
Spoke to patient, will cancel his appointment on 3/11.

## 2019-03-31 NOTE — Telephone Encounter (Signed)
Routed to [employer]

## 2019-04-04 ENCOUNTER — Other Ambulatory Visit: Payer: Self-pay

## 2019-04-04 ENCOUNTER — Ambulatory Visit (INDEPENDENT_AMBULATORY_CARE_PROVIDER_SITE_OTHER): Payer: Medicare Other

## 2019-04-04 DIAGNOSIS — Z86718 Personal history of other venous thrombosis and embolism: Secondary | ICD-10-CM | POA: Diagnosis not present

## 2019-04-04 DIAGNOSIS — I4819 Other persistent atrial fibrillation: Secondary | ICD-10-CM | POA: Diagnosis not present

## 2019-04-04 LAB — POCT INR: INR: 2.3 (ref 2.0–3.0)

## 2019-04-04 NOTE — Patient Instructions (Signed)
Description   Continue taking 1 tablet daily except for 1/2 tablet on Mondays and Fridays. Recheck in 4 weeks. Call us with any medication changes or concerns. Coumadin Clinic # [phone] office.

## 2019-04-09 ENCOUNTER — Telehealth: Payer: Self-pay

## 2019-04-09 NOTE — Telephone Encounter (Signed)
Alert received from CRT-P-  NSVT episode, 30 beat run around 2138 last night.  No current meds that would affect this rhythm.  Pt with known history of persistent AF, presenting rhythm showing AF.  Pt is on [place].    Spoke with pt, he denies any symptoms at time of event.  He was sitting watching TV.  Pt does report he had difficulty sleeping last night but denies related to increased SOB or difficulty laying down.  States he just couldn't get comfortable, resolved with Tylenol.  Pt denies any cardiac symptoms today.    Advised will forward to MD for review.  Pt overdue for in person check, sending to scheduling to contact pt for appt.

## 2019-04-13 NOTE — Progress Notes (Signed)
Cardiology Office Note Date:  04/16/2019  Patient ID:  [name], DOB 11/08/1931, MRN [id number] PCP:  Shirline Frees, MD  Cardiologist:  Dr. Bettina Gavia Structural Heart: Dr. Burt Knack Electrophysiologist: Dr. Lovena Le    Chief Complaint: over due device visit  History of Present Illness: [name] is a 86 y.o. male with history of persistent afib, High grade AV bock, w/PPM,recurrent DVT/PE s/p IVC filter, HTN, chronic combined S/D CHF, prostate CA s/p radical prostatectomy, chronic urinary incontinence, CAD(s/p PCI of severe stenosis throughout the proximal and mid RCA using overlapping drug-eluting stents on2/10/20)and severe AS s/p TAVR (A999333, complicated by failure of perclose device on right side.Hg dropped 15.8-->9.1. Non contrast CT 2/13 showed a right retroperitoneal hematoma 6.8 x 8.4 x 16.5 cm.Coumadin was held and he was discharged home on ASA and plavix. Hospital course was also complicated by post operative hypotension and AKI (creat up to 2.11) and lasix was held.)  He comes in today to be seen for Dr. Lovena Le, last seen by him May 2019, at that time had gained weight being more sedentary.  His LV lead threshold was up some.  He is doing well.  He denies any kind of CP, palpitations or cardiac awareness.  He has stationary bike that her rides fairly regularly and feels like he has good exertional capacity. His weight is stable from last month, though looks like he has gained 8lbs since Jan.  He denies this, states he weighs at home and has been stable.  He put weight on over the year, but nothing that rapidly He does not feel bloated or like he is retaining water.  He mentions that someone called him about a fast heart rate, was surprised because he really feels well, did not note an particular symptoms or activities the day of the tachycardia.  No dizzy spells, no near syncope or syncope  He denies any bleeding or signs of bleeding  He had skin cancer  surgery last week on his forehead, pending another L neck/posterior to his ear   Device information SJM CRT-P, implanted 03/14/2017   Past Medical History:  Diagnosis Date  . Arthritis    "knees, right shoulder" (03/14/2017)  . Atrial fibrillation, persistent ([place]) 09/19/2014  . CAD (coronary artery disease), native coronary artery    Cath 2011 LHC (08/2009):~ Proximal LAD 30%, mid to distal LAD 25%, ostial small D1 75% mid AV groove circumflex 99% been subtotal stenosis, proximal to mid RCA 25-30%, mid RCA 30%, mid PDA 30%, EF 50% with inferior hypokinesis.;    July 2011  PCI and DES to circumflex Dr. Olevia Perches   ETT-Myoview (06/2013):  Inferolateral scar, mild peri-infarct ischemia, EF 40%; Intermediate Risk   ECHO EF 55% 03/2013   . Cardiac pacemaker in situ 03/14/2017   Biventricular St. Jude inserted 03/14/17 Dr. Lovena Le for second degree heart block   . CKD (chronic kidney disease), stage III   . GERD   . History of infection of prosthetic knee 04/05/2015   Treated with debridement and irrigation followed by 6 months of triple antibiotics  . History of kidney stones   . History of peptic ulcer 1970s?  Marland Kitchen History of prostate cancer    Radical prostatectomy in 2006 Dr. Terance Hart   . Hypertensive heart disease   . Long term (current) use of anticoagulants 10/06/2011  . Mixed hyperlipidemia   . Moderate persistent asthma without complication 99991111  . Obesity (BMI 30-39.9)   . OSA on CPAP   . Personal  history of DVT and pulmonary embolism  (deep vein thrombosis)    Initial DVT in 2006 after prostate surgery and had [place] filter placed Bilateral  PE 2013 and placed back on warfarin DVT of right subclavian vein on doppler 10/2012 at time of knee infection   . Personal history of malignant neoplasm of prostate    Radical prostatectomy in 2006 Dr. Terance Hart    . Severe aortic stenosis   . Severe aortic stenosis 03/18/2018  . Urinary incontinence    MULTIPLE BLADDER SURGERIES - STATES NO  URINARY SPHINCTER - PT'S UROLOGIST IS AT [facility]- DR. PETERSON  ( LAST VISIT WAS 09/15/11 )    Past Surgical History:  Procedure Laterality Date  . APPENDECTOMY    . BI-VENTRICULAR PACEMAKER INSERTION (CRT-P)  03/14/2017  . BIV PACEMAKER INSERTION CRT-P N/A 03/14/2017   Procedure: BIV PACEMAKER INSERTION CRT-P;  Surgeon: Evans Lance, MD;  Location: [place] [facility];  Service: Cardiovascular;  Laterality: N/A;  . CARDIOVERSION N/A 10/08/2014   Procedure: CARDIOVERSION;  Surgeon: Jacolyn Reedy, MD;  Location: [facility];  Service: Cardiovascular;  Laterality: N/A;  . CATARACT EXTRACTION W/ INTRAOCULAR LENS  IMPLANT, BILATERAL Bilateral   . CORONARY ANGIOPLASTY WITH STENT PLACEMENT  08/25/2009   DES-mid LCx 08/2009; 30% pLAD, 25% m/dLAD, 75% ostial D1, 99% mLCx s/p DES, 25-30% p/mRCA, 40% mRCA, 30% mPDA stenoses; LVEF 50%, inf hypokinesis  . CORONARY STENT INTERVENTION N/A 03/18/2018   Procedure: CORONARY STENT INTERVENTION;  Surgeon: Sherren Mocha, MD;  Location: [place] [facility];  Service: Cardiovascular;  Laterality: N/A;  . EXCISIONAL HEMORRHOIDECTOMY    . I & D KNEE WITH POLY EXCHANGE Left 10/09/2012   Procedure: IRRIGATION AND DEBRIDEMENT LEFT KNEE WITH POLY REVISION;  Surgeon: Gearlean Alf, MD;  Location: [facility];  Service: Orthopedics;  Laterality: Left;  . INGUINAL HERNIA REPAIR Right   . INTRAOPERATIVE TRANSTHORACIC ECHOCARDIOGRAM  03/19/2018   Procedure: Intraoperative Transthoracic Echocardiogram;  Surgeon: Sherren Mocha, MD;  Location: [place];  Service: Open Heart Surgery;;  . JOINT REPLACEMENT    . KNEE ARTHROTOMY Left 10/09/2012   Procedure: LEFT KNEE ARTHROTOMY;  Surgeon: Gearlean Alf, MD;  Location: [facility];  Service: Orthopedics;  Laterality: Left;  . LEFT HEART CATH AND CORONARY ANGIOGRAPHY N/A 02/28/2018   Procedure: LEFT HEART CATH AND CORONARY ANGIOGRAPHY;  Surgeon: Sherren Mocha, MD;  Location: [place] [facility];  Service: Cardiovascular;  Laterality: N/A;  .  PROSTATECTOMY  04/25/2004  . REPLACEMENT TOTAL KNEE Bilateral   . SKIN BIOPSY     "off nose; wasn't cancer; it was tested" (03/14/2017)  . TRANSCATHETER AORTIC VALVE REPLACEMENT, TRANSFEMORAL N/A 03/19/2018   Procedure: TRANSCATHETER AORTIC VALVE REPLACEMENT, TRANSFEMORAL;  Surgeon: Sherren Mocha, MD;  Location: [place];  Service: Open Heart Surgery;  Laterality: N/A;  . Uretheral implants     multiple for incontinence  . VENA CAVA FILTER PLACEMENT      Current Outpatient Medications  Medication Sig Dispense Refill  . amLODipine (NORVASC) 2.5 MG tablet Take 2.5 mg by mouth every evening.     . Ascorbic Acid (VITAMIN C) 1000 MG tablet Take 1,000 mg by mouth daily.    . Biotin 2500 MCG CAPS Take 2,500 mcg by mouth daily.    . cholecalciferol (VITAMIN D) 1000 UNITS tablet Take 1,000 Units by mouth daily.    Marland Kitchen docusate sodium (COLACE) 50 MG capsule Take 50 mg by mouth daily.    . fenofibrate 160 MG tablet TAKE 1 TABLET  BY MOUTH EVERY DAY 90 tablet 1  . furosemide (LASIX) 20 MG tablet Take 1 tab (20 mg) daily except take ann additional dose on  Twice a week 90 tablet 1  . Glucosamine HCl (GLUCOSAMINE PO) Take 2,000 mg by mouth daily.    . Ibuprofen-diphenhydrAMINE Cit (ADVIL PM PO) Take 1 tablet by mouth daily.    . Multiple Vitamins-Minerals (ADULT GUMMY PO) Take 1 tablet by mouth daily.    . nitroGLYCERIN (NITROSTAT) 0.4 MG SL tablet Place 1 tablet (0.4 mg total) under the tongue every 5 (five) minutes as needed. 25 tablet 2  . Omega-3 Fatty Acids (FISH OIL) 1000 MG CAPS Take 1,000 mg by mouth daily.    . pantoprazole (PROTONIX) 40 MG tablet TAKE 1 TABLET BY MOUTH EVERY DAY 90 tablet 3  . pravastatin (PRAVACHOL) 20 MG tablet Take 1 tablet (20 mg total) by mouth daily. 90 tablet 1  . vitamin B-12 (CYANOCOBALAMIN) 500 MCG tablet Take 500 mcg by mouth daily.    Marland Kitchen warfarin (COUMADIN) 2.5 MG tablet TAKE 1 TABLET BY MOUTH EVERY DAY 90 tablet 1   No current facility-administered medications for this  visit.    Allergies:   Darifenacin and Lisinopril   Social History:  The patient  reports that he quit smoking about 58 years ago. His smoking use included cigarettes. He has a 10.00 pack-year smoking history. He has never used smokeless tobacco. He reports that he does not drink alcohol or use drugs.   Family History:  The patient's family history includes Melanoma in his brother and father.  ROS:  Please see the history of present illness.  All other systems are reviewed and otherwise negative.   PHYSICAL EXAM:  VS:  BP 124/82   Pulse 63   Ht 6' (1.829 m)   Wt 238 lb (108 kg)   SpO2 98%   BMI 32.28 kg/m  BMI: Body mass index is 32.28 kg/m. Well nourished, well developed, in no acute distress  HEENT: normocephalic, atraumatic  Neck: no JVD, carotid bruits or masses Cardiac:  RRR (paced); no significant murmurs, no rubs, or gallops Lungs:  CTA b/l, no wheezing, rhonchi or rales  Abd: soft, nontender, obese MS: no deformity or atrophy Ext: no edema  Skin: warm and dry, no rash Neuro:  No gross deficits appreciated Psych: euthymic mood, full affect  PPM site is stable, no tethering or discomfort   EKG:  Not done today  PPM interrogation done today and reviewed by myself:  Battery and lead measurements are good 100% AF One NSVT 04/08/2019, 7 seconds 98% BV pacing   03/19/2019 : TTE IMPRESSIONS  1. 1 year post TAVR, normal transaortic peak/mean gradients 18/9 mmHg, no  paravalvular leak.  2. Left ventricular ejection fraction, by estimation, is 50 to 55%. The  left ventricle has low normal function. The left ventrical has no regional  wall motion abnormalities. Left ventricular diastolic function could not  be evaluated.  3. Right ventricular systolic function is normal. The right ventricular  size is normal. There is normal pulmonary artery systolic pressure.  4. Left atrial size was moderately dilated. No left atrial/left atrial  appendage thrombus was detected.    5. Right atrial size was mildly dilated.  6. The mitral valve is normal in structure and function. no evidence of  mitral valve regurgitation. No evidence of mitral stenosis.  7. No paravalvular leak.. The aortic valve has been repaired/replaced.  Aortic valve regurgitation is not visualized. No aortic stenosis  is  present. 59mm valve is present in the aortic position. Echo findings show  normal structure and function of the  aortic prosthesis.  8. The inferior vena cava is normal in size with greater than 50%  respiratory variability, suggesting right atrial pressure of 3 mmHg.    03/18/2018: LHC/PCI  Ost 3rd Mrg lesion is 100% stenosed.  Ost 2nd Mrg to 2nd Mrg lesion is 80% stenosed.  Prox Cx to Mid Cx lesion is 20% stenosed.  Prox RCA to Mid RCA lesion is 50% stenosed.  Mid RCA lesion is 75% stenosed.  Dist RCA lesion is 50% stenosed.  A drug-eluting stent was successfully placed using a STENT SYNERGY DES 2.5X32.  Post intervention, there is a 0% residual stenosis.  Post intervention, there is a 0% residual stenosis.   Successful PCI of severe stenosis throughout the proximal and mid RCA using overlapping drug-eluting stents Plan: Admit for hydration overnight with plans for TAVR tomorrow   Recent Labs: 02/13/2019: BUN 30; Creatinine, Ser 1.74; NT-Pro BNP 1,252; Potassium 4.9; Sodium 146  No results found for requested labs within last 8760 hours.   CrCl cannot be calculated (Patient's most recent lab result is older than the maximum 21 days allowed.).   Wt Readings from Last 3 Encounters:  04/16/19 238 lb (108 kg)  03/19/19 238 lb (108 kg)  02/13/19 230 lb (104.3 kg)     Other studies reviewed: Additional studies/records reviewed today include: summarized above  ASSESSMENT AND PLAN:  1. PPM     Intact function, no programming changes     LV lead known to have higher thresholds, though unchanged from last check in May 2019, left with 1/4V margin   2.  CAD     S/p PCI 03/2018     No anginal symptoms     C/w Dr. Bettina Gavia  3. VHD     S/p TAVR 03/2018     No murmur on exam     C/w Dr. Burt Knack  4. Longstanding persistent AFib     CHA2DS2Vasc is at least 5, on warfarin, monitored and managed by our coumadin clinic  5. NSVT    On event, echo last month with preserved/recovered LVEF    Will check lytes   Disposition: F/u with Q 3 mo remotes and in clinic with EP in a year, sooner if neeed   Current medicines are reviewed at length with the patient today.  The patient did not have any concerns regarding medicines.  Venetia Night, PA-C 04/16/2019 10:25 AM     [address] [place] [address] [phone] (office)  [phone] (fax)

## 2019-04-14 ENCOUNTER — Telehealth: Payer: Self-pay | Admitting: *Deleted

## 2019-04-14 DIAGNOSIS — C44319 Basal cell carcinoma of skin of other parts of face: Secondary | ICD-10-CM | POA: Diagnosis not present

## 2019-04-14 NOTE — Telephone Encounter (Signed)
Pt called and stated that he had a forehead tag removed today and was told it was non-cancerous and  that he should take tylenol every 3 hours and then take ibuprofen every 3 hours and repeat 4 different occasions. Advised that with Warfarin the Ibuprofen can cause the risk of bleeding and that he could call the MD back to see if he could just use the tylenol. He stated he prefers not to use the Ibuprofen because he has stayed away from it in the past because of the risk. Pt states he has no pain and will only use the recommended dose of tylenol as well. Pt is aware to call his MD that removed the forehead cancer if he needs to use anything different. Also, he states he took 4 antibiotics pills prior to procedure and no prescriptions were sent home.

## 2019-04-16 ENCOUNTER — Ambulatory Visit (INDEPENDENT_AMBULATORY_CARE_PROVIDER_SITE_OTHER): Payer: Medicare Other | Admitting: Physician Assistant

## 2019-04-16 ENCOUNTER — Other Ambulatory Visit: Payer: Self-pay

## 2019-04-16 VITALS — BP 124/82 | HR 63 | Ht 72.0 in | Wt 238.0 lb

## 2019-04-16 DIAGNOSIS — I4729 Other ventricular tachycardia: Secondary | ICD-10-CM

## 2019-04-16 DIAGNOSIS — Z950 Presence of cardiac pacemaker: Secondary | ICD-10-CM

## 2019-04-16 DIAGNOSIS — I5042 Chronic combined systolic (congestive) and diastolic (congestive) heart failure: Secondary | ICD-10-CM | POA: Diagnosis not present

## 2019-04-16 DIAGNOSIS — I472 Ventricular tachycardia: Secondary | ICD-10-CM | POA: Diagnosis not present

## 2019-04-16 DIAGNOSIS — Z952 Presence of prosthetic heart valve: Secondary | ICD-10-CM | POA: Diagnosis not present

## 2019-04-16 DIAGNOSIS — I2510 Atherosclerotic heart disease of native coronary artery without angina pectoris: Secondary | ICD-10-CM | POA: Diagnosis not present

## 2019-04-16 DIAGNOSIS — I25119 Atherosclerotic heart disease of native coronary artery with unspecified angina pectoris: Secondary | ICD-10-CM

## 2019-04-16 DIAGNOSIS — I4811 Longstanding persistent atrial fibrillation: Secondary | ICD-10-CM | POA: Diagnosis not present

## 2019-04-16 LAB — BASIC METABOLIC PANEL
BUN/Creatinine Ratio: 14 (ref 10–24)
BUN: 23 mg/dL (ref 8–27)
CO2: 22 mmol/L (ref 20–29)
Calcium: 10 mg/dL (ref 8.6–10.2)
Chloride: 107 mmol/L — ABNORMAL HIGH (ref 96–106)
Creatinine, Ser: 1.63 mg/dL — ABNORMAL HIGH (ref 0.76–1.27)
GFR calc Af Amer: 43 mL/min/{1.73_m2} — ABNORMAL LOW (ref >59)
GFR calc non Af Amer: 38 mL/min/{1.73_m2} — ABNORMAL LOW (ref >59)
Glucose: 135 mg/dL — ABNORMAL HIGH (ref 65–99)
Potassium: 4.2 mmol/L (ref 3.5–5.2)
Sodium: 142 mmol/L (ref 134–144)

## 2019-04-16 LAB — MAGNESIUM: Magnesium: 1.9 mg/dL (ref 1.6–2.3)

## 2019-04-16 NOTE — Patient Instructions (Signed)
Medication Instructions:   Your physician recommends that you continue on your current medications as directed. Please refer to the Current Medication list given to you today.   *If you need a refill on your cardiac medications before your next appointment, please call your pharmacy*   Lab Work:  BMET  AND MAG   If you have labs (blood work) drawn today and your tests are completely normal, you will receive your results only by: Marland Kitchen MyChart Message (if you have MyChart) OR . A paper copy in the mail If you have any lab test that is abnormal or we need to change your treatment, we will call you to review the results.   Testing/Procedures: NONE ORDERED  TODAY  Follow-Up: At [facility], you and your health needs are our priority.  As part of our continuing mission to provide you with exceptional heart care, we have created designated Provider Care Teams.  These Care Teams include your primary Cardiologist (physician) and Advanced Practice Providers (APPs -  Physician Assistants and Nurse Practitioners) who all work together to provide you with the care you need, when you need it.  We recommend signing up for the patient portal called "MyChart".  Sign up information is provided on this After Visit Summary.  MyChart is used to connect with patients for Virtual Visits (Telemedicine).  Patients are able to view lab/test results, encounter notes, upcoming appointments, etc.  Non-urgent messages can be sent to your provider as well.   To learn more about what you can do with MyChart, go to [email or web address].    Your next appointment:   3 month(s)  The format for your next appointment:   In Person  Provider:   You may see Dr. Lovena Le  or one of the following Advanced Practice Providers on your designated Care Team:    Chanetta Marshall, NP  Tommye Standard, PA-C  Legrand Como "Oda Kilts, [place]    Other Instructions

## 2019-04-28 DIAGNOSIS — C4441 Basal cell carcinoma of skin of scalp and neck: Secondary | ICD-10-CM | POA: Diagnosis not present

## 2019-05-02 ENCOUNTER — Ambulatory Visit (INDEPENDENT_AMBULATORY_CARE_PROVIDER_SITE_OTHER): Payer: Medicare Other | Admitting: *Deleted

## 2019-05-02 ENCOUNTER — Other Ambulatory Visit: Payer: Self-pay

## 2019-05-02 DIAGNOSIS — Z86718 Personal history of other venous thrombosis and embolism: Secondary | ICD-10-CM

## 2019-05-02 DIAGNOSIS — Z5181 Encounter for therapeutic drug level monitoring: Secondary | ICD-10-CM

## 2019-05-02 DIAGNOSIS — I4819 Other persistent atrial fibrillation: Secondary | ICD-10-CM

## 2019-05-02 LAB — POCT INR: INR: 2.2 (ref 2.0–3.0)

## 2019-05-02 NOTE — Patient Instructions (Signed)
Description   Continue taking 1 tablet daily except for 1/2 tablet on Mondays and Fridays. Recheck in 5 weeks. Call us with any medication changes or concerns. [facility] # [phone] office.

## 2019-06-06 ENCOUNTER — Other Ambulatory Visit: Payer: Self-pay

## 2019-06-06 ENCOUNTER — Ambulatory Visit (INDEPENDENT_AMBULATORY_CARE_PROVIDER_SITE_OTHER): Payer: Medicare Other | Admitting: *Deleted

## 2019-06-06 DIAGNOSIS — Z86718 Personal history of other venous thrombosis and embolism: Secondary | ICD-10-CM | POA: Diagnosis not present

## 2019-06-06 DIAGNOSIS — I4819 Other persistent atrial fibrillation: Secondary | ICD-10-CM

## 2019-06-06 DIAGNOSIS — Z5181 Encounter for therapeutic drug level monitoring: Secondary | ICD-10-CM | POA: Diagnosis not present

## 2019-06-06 LAB — POCT INR: INR: 2 (ref 2.0–3.0)

## 2019-06-06 NOTE — Patient Instructions (Signed)
Description   Continue taking 1 tablet daily except for 1/2 tablet on Mondays and Fridays. Recheck in 6 weeks. Call us with any medication changes or concerns. Coumadin Clinic # [phone] office.

## 2019-06-17 ENCOUNTER — Other Ambulatory Visit: Payer: Self-pay | Admitting: Cardiology

## 2019-06-17 ENCOUNTER — Other Ambulatory Visit: Payer: Self-pay

## 2019-06-17 ENCOUNTER — Ambulatory Visit (INDEPENDENT_AMBULATORY_CARE_PROVIDER_SITE_OTHER): Payer: Medicare Other | Admitting: *Deleted

## 2019-06-17 DIAGNOSIS — I441 Atrioventricular block, second degree: Secondary | ICD-10-CM | POA: Diagnosis not present

## 2019-06-17 LAB — CUP PACEART REMOTE DEVICE CHECK
Battery Remaining Longevity: 77 mo
Battery Remaining Percentage: 95.5 %
Battery Voltage: 2.96 V
Brady Statistic AP VP Percent: 0 %
Brady Statistic AP VS Percent: 0 %
Brady Statistic AS VP Percent: 0 %
Brady Statistic AS VS Percent: 0 %
Brady Statistic RA Percent Paced: 1 %
Date Time Interrogation Session: 20210511020022
Implantable Lead Implant Date: 20190206
Implantable Lead Implant Date: 20190206
Implantable Lead Implant Date: 20190206
Implantable Lead Location: 753858
Implantable Lead Location: 753859
Implantable Lead Location: 753860
Implantable Pulse Generator Implant Date: 20190206
Lead Channel Impedance Value: 440 Ohm
Lead Channel Impedance Value: 510 Ohm
Lead Channel Impedance Value: 960 Ohm
Lead Channel Pacing Threshold Amplitude: 0.375 V
Lead Channel Pacing Threshold Amplitude: 0.75 V
Lead Channel Pacing Threshold Amplitude: 2.75 V
Lead Channel Pacing Threshold Pulse Width: 0.5 ms
Lead Channel Pacing Threshold Pulse Width: 0.5 ms
Lead Channel Pacing Threshold Pulse Width: 0.8 ms
Lead Channel Sensing Intrinsic Amplitude: 12 mV
Lead Channel Sensing Intrinsic Amplitude: 2.2 mV
Lead Channel Setting Pacing Amplitude: 1.375
Lead Channel Setting Pacing Amplitude: 2 V
Lead Channel Setting Pacing Amplitude: 3 V
Lead Channel Setting Pacing Pulse Width: 0.5 ms
Lead Channel Setting Pacing Pulse Width: 0.8 ms
Lead Channel Setting Sensing Sensitivity: 2 mV
Pulse Gen Model: 3262
Pulse Gen Serial Number: 8995812

## 2019-06-17 MED ORDER — PANTOPRAZOLE SODIUM 40 MG PO TBEC: 40.0000 mg | DELAYED_RELEASE_TABLET | Freq: Every day | ORAL | 3 refills | Status: DC

## 2019-06-17 NOTE — Telephone Encounter (Signed)
Pt's medication was sent to pt's pharmacy as requested. Confirmation received.  °

## 2019-06-19 NOTE — Progress Notes (Signed)
Remote pacemaker transmission.   

## 2019-06-29 ENCOUNTER — Other Ambulatory Visit: Payer: Self-pay | Admitting: Cardiology

## 2019-06-30 ENCOUNTER — Other Ambulatory Visit: Payer: Self-pay

## 2019-06-30 DIAGNOSIS — M5417 Radiculopathy, lumbosacral region: Secondary | ICD-10-CM | POA: Diagnosis not present

## 2019-06-30 DIAGNOSIS — M9903 Segmental and somatic dysfunction of lumbar region: Secondary | ICD-10-CM | POA: Diagnosis not present

## 2019-06-30 DIAGNOSIS — M9901 Segmental and somatic dysfunction of cervical region: Secondary | ICD-10-CM | POA: Diagnosis not present

## 2019-06-30 DIAGNOSIS — M546 Pain in thoracic spine: Secondary | ICD-10-CM | POA: Diagnosis not present

## 2019-06-30 DIAGNOSIS — M62830 Muscle spasm of back: Secondary | ICD-10-CM | POA: Diagnosis not present

## 2019-06-30 MED ORDER — FENOFIBRATE 160 MG PO TABS: 160.0000 mg | ORAL_TABLET | Freq: Every day | ORAL | 3 refills | Status: DC

## 2019-07-02 DIAGNOSIS — M62830 Muscle spasm of back: Secondary | ICD-10-CM | POA: Diagnosis not present

## 2019-07-02 DIAGNOSIS — M9903 Segmental and somatic dysfunction of lumbar region: Secondary | ICD-10-CM | POA: Diagnosis not present

## 2019-07-02 DIAGNOSIS — M546 Pain in thoracic spine: Secondary | ICD-10-CM | POA: Diagnosis not present

## 2019-07-02 DIAGNOSIS — M9901 Segmental and somatic dysfunction of cervical region: Secondary | ICD-10-CM | POA: Diagnosis not present

## 2019-07-02 DIAGNOSIS — M5417 Radiculopathy, lumbosacral region: Secondary | ICD-10-CM | POA: Diagnosis not present

## 2019-07-03 ENCOUNTER — Other Ambulatory Visit: Payer: Self-pay | Admitting: Cardiology

## 2019-07-04 DIAGNOSIS — M62830 Muscle spasm of back: Secondary | ICD-10-CM | POA: Diagnosis not present

## 2019-07-04 DIAGNOSIS — M9901 Segmental and somatic dysfunction of cervical region: Secondary | ICD-10-CM | POA: Diagnosis not present

## 2019-07-04 DIAGNOSIS — M546 Pain in thoracic spine: Secondary | ICD-10-CM | POA: Diagnosis not present

## 2019-07-04 DIAGNOSIS — M5417 Radiculopathy, lumbosacral region: Secondary | ICD-10-CM | POA: Diagnosis not present

## 2019-07-04 DIAGNOSIS — M9903 Segmental and somatic dysfunction of lumbar region: Secondary | ICD-10-CM | POA: Diagnosis not present

## 2019-07-10 DIAGNOSIS — M9901 Segmental and somatic dysfunction of cervical region: Secondary | ICD-10-CM | POA: Diagnosis not present

## 2019-07-10 DIAGNOSIS — M5417 Radiculopathy, lumbosacral region: Secondary | ICD-10-CM | POA: Diagnosis not present

## 2019-07-10 DIAGNOSIS — M9903 Segmental and somatic dysfunction of lumbar region: Secondary | ICD-10-CM | POA: Diagnosis not present

## 2019-07-10 DIAGNOSIS — M62830 Muscle spasm of back: Secondary | ICD-10-CM | POA: Diagnosis not present

## 2019-07-10 DIAGNOSIS — M546 Pain in thoracic spine: Secondary | ICD-10-CM | POA: Diagnosis not present

## 2019-07-11 ENCOUNTER — Telehealth: Payer: Self-pay | Admitting: Cardiology

## 2019-07-11 NOTE — Telephone Encounter (Signed)
Spoke to the patient just now and let him know that I believe his missed phone calls were in regards to his appointment on Monday. Just a reminder of his appointment. He verbalizes understanding and does not have any other issues or concerns at this time.    Encouraged patient to call back with any questions or concerns.

## 2019-07-11 NOTE — Telephone Encounter (Signed)
New Message  Pt called and stated that he received several calls from Dr. Bettina Gavia. Please call back

## 2019-07-13 NOTE — Progress Notes (Signed)
Cardiology Office Note:    Date:  07/14/2019   ID:  [name], DOB August 23, 1931, MRN [id number]  PCP:  Shirline Frees, MD  Cardiologist:  Shirlee More, MD    Referring MD: Shirline Frees, MD    ASSESSMENT:    1. S/P TAVR (transcatheter aortic valve replacement)   2. Coronary artery disease involving native coronary artery of native heart without angina pectoris   3. Persistent atrial fibrillation ([place])   4. Long term (current) use of anticoagulants   5. Biventricular cardiac pacemaker in situ   6. Chronic combined systolic and diastolic heart failure ([place])   7. Hypertensive heart disease with chronic combined systolic and diastolic congestive heart failure ([place])    PLAN:    In order of problems listed above:  1. Stable he has had a good durable response and continue his current medical treatment post TAVR 2. Stable CAD no angina on current medical treatment [place] Heart Association class I continue treatment including statin calcium channel blocker 3. Stable pacemaker function followed in our practice 4. Continue long-term anticoagulation 5. Heart failure is compensated no edema continue current loop diuretic recheck renal function potassium BP at target continue current antihypertensives Hyperlipidemia check liver function lipid profile continue fenofibrate and statin  Next appointment: 6 months   Medication Adjustments/Labs and Tests Ordered: Current medicines are reviewed at length with the patient today.  Concerns regarding medicines are outlined above.  No orders of the defined types were placed in this encounter.  No orders of the defined types were placed in this encounter.   Chief Complaint  Patient presents with  . Follow-up    After TAVR    History of Present Illness:    [name] is a 86 y.o. male with a hx of CAD s/p PCI with DES to RCA 03/18/18, severe AS s/p TAVR 03/19/18, persistent atrial fibrillation anticoagulated on Warfarin,  recurrent DVT/PE s/p IVC filter, HTN, combined heart failure, and a permanent pacemaker last seen by me 02/13/2019. Compliance with diet, lifestyle and medications: Yes  He was frustrated trying to contact the office through the answering service.  Will be instructed how to use my chart.  From cardiology perspective he is doing well he is not having edema shortness of breath chest pain palpitation or syncope he tolerates his statin without muscle pain or weakness in his anticoagulant without bleeding complication.  His last device check 06/17/2019 showed normal device parameters and function with persistent atrial fibrillation and effective biventricular pacing.  Echocardiogram performed 03/19/2019 1 year post TAVR shows normal valve function and no paravalvular leak ejection fraction low normal 50 to 55%. Past Medical History:  Diagnosis Date  . Aortic valve stenosis 04/05/2015   Formatting of this note might be different from the original. Overview:  ECHO 5/15 Formatting of this note might be different from the original. Overview:  Overview:  ECHO 5/15  . Arthritis    "knees, right shoulder" (03/14/2017)  . Atrial fibrillation, persistent ([place]) 09/19/2014  . CAD (coronary artery disease), native coronary artery    Cath 2011 LHC (08/2009):~ Proximal LAD 30%, mid to distal LAD 25%, ostial small D1 75% mid AV groove circumflex 99% been subtotal stenosis, proximal to mid RCA 25-30%, mid RCA 30%, mid PDA 30%, EF 50% with inferior hypokinesis.;    July 2011  PCI and DES to circumflex Dr. Olevia Perches   ETT-Myoview (06/2013):  Inferolateral scar, mild peri-infarct ischemia, EF 40%; Intermediate Risk   ECHO EF 55% 03/2013   .  CAD (coronary artery disease), native coronary artery 04/05/2015   Cath 2011 LHC (08/2009):~ Proximal LAD 30%, mid to distal LAD 25%, ostial small D1 75% mid AV groove circumflex 99% been subtotal stenosis, proximal to mid RCA 25-30%, mid RCA 30%, mid PDA 30%, EF 50% with inferior hypokinesis.;     July 2011  PCI and DES to circumflex Dr. Olevia Perches   ETT-Myoview (06/2013):  Inferolateral scar, mild peri-infarct ischemia, EF 40%; Intermediate Risk   ECHO EF 55% 03/2013    . Cardiac pacemaker in situ 03/14/2017   Biventricular St. Jude inserted 03/14/17 Dr. Lovena Le for second degree heart block   . Chronic combined systolic and diastolic heart failure ([place]) 11/19/2017  . CKD (chronic kidney disease), stage III   . Gastro-esophageal reflux disease without esophagitis 09/24/2009  . GERD   . History of infection of prosthetic knee 04/05/2015   Treated with debridement and irrigation followed by 6 months of triple antibiotics  . History of kidney stones   . History of peptic ulcer 1970s?  Marland Kitchen History of prostate cancer    Radical prostatectomy in 2006 Dr. Terance Hart   . Hypertensive heart disease   . Internal hemorrhoids 09/11/2018  . Long term (current) use of anticoagulants 10/06/2011  . Mixed hyperlipidemia   . Moderate persistent asthma without complication 7/98/9211  . Noise effect on both inner ears 08/14/2018  . Obesity (BMI 30-39.9)   . OSA on CPAP   . OSA treated with BiPAP 11/15/2015  . Persistent atrial fibrillation ([place]) 09/19/2014   CHA2DS2VASC score 5  Cardioversion 10/08/2014 was on amiodarone until 2018   . 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)    Initial DVT in 2006 after prostate surgery and had [place] filter placed Bilateral  PE 2013 and placed back on warfarin DVT of right subclavian vein on doppler 10/2012 at time of knee infection   . Personal history of malignant neoplasm of prostate    Radical prostatectomy in 2006 Dr. Terance Hart    . Presbycusis of both ears 08/14/2018  . Severe aortic stenosis   . Severe aortic stenosis 03/18/2018  . Urinary incontinence    MULTIPLE BLADDER SURGERIES - STATES NO URINARY SPHINCTER - PT'S UROLOGIST IS AT [facility]- DR. PETERSON  ( LAST VISIT WAS 09/15/11 )    Past Surgical History:  Procedure Laterality Date  .  APPENDECTOMY    . BI-VENTRICULAR PACEMAKER INSERTION (CRT-P)  03/14/2017  . BIV PACEMAKER INSERTION CRT-P N/A 03/14/2017   Procedure: BIV PACEMAKER INSERTION CRT-P;  Surgeon: Evans Lance, MD;  Location: [place] [facility];  Service: Cardiovascular;  Laterality: N/A;  . CARDIOVERSION N/A 10/08/2014   Procedure: CARDIOVERSION;  Surgeon: Jacolyn Reedy, MD;  Location: [facility] ENDOSCOPY;  Service: Cardiovascular;  Laterality: N/A;  . CATARACT EXTRACTION W/ INTRAOCULAR LENS  IMPLANT, BILATERAL Bilateral   . CORONARY ANGIOPLASTY WITH STENT PLACEMENT  08/25/2009   DES-mid LCx 08/2009; 30% pLAD, 25% m/dLAD, 75% ostial D1, 99% mLCx s/p DES, 25-30% p/mRCA, 40% mRCA, 30% mPDA stenoses; LVEF 50%, inf hypokinesis  . CORONARY STENT INTERVENTION N/A 03/18/2018   Procedure: CORONARY STENT INTERVENTION;  Surgeon: Sherren Mocha, MD;  Location: [place] [facility];  Service: Cardiovascular;  Laterality: N/A;  . EXCISIONAL HEMORRHOIDECTOMY    . I & D KNEE WITH POLY EXCHANGE Left 10/09/2012   Procedure: IRRIGATION AND DEBRIDEMENT LEFT KNEE WITH POLY REVISION;  Surgeon: Gearlean Alf, MD;  Location: [facility];  Service: Orthopedics;  Laterality: Left;  .  INGUINAL HERNIA REPAIR Right   . INTRAOPERATIVE TRANSTHORACIC ECHOCARDIOGRAM  03/19/2018   Procedure: Intraoperative Transthoracic Echocardiogram;  Surgeon: Sherren Mocha, MD;  Location: [place];  Service: Open Heart Surgery;;  . JOINT REPLACEMENT    . KNEE ARTHROTOMY Left 10/09/2012   Procedure: LEFT KNEE ARTHROTOMY;  Surgeon: Gearlean Alf, MD;  Location: [facility];  Service: Orthopedics;  Laterality: Left;  . LEFT HEART CATH AND CORONARY ANGIOGRAPHY N/A 02/28/2018   Procedure: LEFT HEART CATH AND CORONARY ANGIOGRAPHY;  Surgeon: Sherren Mocha, MD;  Location: [place] [facility];  Service: Cardiovascular;  Laterality: N/A;  . PROSTATECTOMY  04/25/2004  . REPLACEMENT TOTAL KNEE Bilateral   . SKIN BIOPSY     "off nose; wasn't cancer; it was tested" (03/14/2017)  .  TRANSCATHETER AORTIC VALVE REPLACEMENT, TRANSFEMORAL N/A 03/19/2018   Procedure: TRANSCATHETER AORTIC VALVE REPLACEMENT, TRANSFEMORAL;  Surgeon: Sherren Mocha, MD;  Location: [place];  Service: Open Heart Surgery;  Laterality: N/A;  . Uretheral implants     multiple for incontinence  . VENA CAVA FILTER PLACEMENT      Current Medications: Current Meds  Medication Sig  . amLODipine (NORVASC) 2.5 MG tablet Take 2.5 mg by mouth every evening.   . Ascorbic Acid (VITAMIN C) 1000 MG tablet Take 1,000 mg by mouth daily.  . Biotin 2500 MCG CAPS Take 2,500 mcg by mouth daily.  . cholecalciferol (VITAMIN D) 1000 UNITS tablet Take 1,000 Units by mouth daily.  Marland Kitchen docusate sodium (COLACE) 50 MG capsule Take 50 mg by mouth daily.  . fenofibrate 160 MG tablet Take 1 tablet (160 mg total) by mouth daily.  . Glucosamine HCl (GLUCOSAMINE PO) Take 2,000 mg by mouth daily.  . Ibuprofen-diphenhydrAMINE Cit (ADVIL PM PO) Take 1 tablet by mouth daily.  . Multiple Vitamin (MULTIVITAMIN) capsule   . nitroGLYCERIN (NITROSTAT) 0.4 MG SL tablet Place 1 tablet (0.4 mg total) under the tongue every 5 (five) minutes as needed.  . Omega-3 Fatty Acids (FISH OIL) 1000 MG CAPS Take 1,000 mg by mouth daily.  . pravastatin (PRAVACHOL) 20 MG tablet Take 1 tablet (20 mg total) by mouth daily.  . vitamin B-12 (CYANOCOBALAMIN) 500 MCG tablet Take 500 mcg by mouth daily.  Marland Kitchen warfarin (COUMADIN) 2.5 MG tablet TAKE 1 TABLET BY MOUTH EVERY DAY     Allergies:   Patient has no known allergies.   Social History   Socioeconomic History  . Marital status: Married    Spouse name: Not on file  . Number of children: 3  . Years of education: Not on file  . Highest education level: Not on file  Occupational History  . Occupation: Retired Therapist, nutritional  Tobacco Use  . Smoking status: Former Smoker    Packs/day: 1.00    Years: 10.00    Pack years: 10.00    Types: Cigarettes    Quit date: 04/27/1961    Years since quitting: 58.2  .  Smokeless tobacco: Never Used  Substance and Sexual Activity  . Alcohol use: No    Alcohol/week: 0.0 standard drinks  . Drug use: No  . Sexual activity: Not on file  Other Topics Concern  . Not on file  Social History Narrative  . Not on file   Social Determinants of Health   Financial Resource Strain:   . Difficulty of Paying Living Expenses:   Food Insecurity:   . Worried About Charity fundraiser in the Last Year:   . Mart in the  Last Year:   Transportation Needs:   . Film/video editor (Medical):   Marland Kitchen Lack of Transportation (Non-Medical):   Physical Activity:   . Days of Exercise per Week:   . Minutes of Exercise per Session:   Stress:   . Feeling of Stress :   Social Connections:   . Frequency of Communication with Friends and Family:   . Frequency of Social Gatherings with Friends and Family:   . Attends Religious Services:   . Active Member of Clubs or Organizations:   . Attends Archivist Meetings:   Marland Kitchen Marital Status:      Family History: The patient's family history includes Melanoma in his brother and father. ROS:   Please see the history of present illness.    All other systems reviewed and are negative.  EKGs/Labs/Other Studies Reviewed:    The following studies were reviewed today:    Recent Labs: 02/13/2019: NT-Pro BNP 1,252 04/16/2019: BUN 23; Creatinine, Ser 1.63; Magnesium 1.9; Potassium 4.2; Sodium 142  Recent Lipid Panel    Component Value Date/Time   CHOL 118 04/28/2011 1003   TRIG 85.0 04/28/2011 1003   HDL 34.90 (L) 04/28/2011 1003   CHOLHDL 3 04/28/2011 1003   VLDL 17.0 04/28/2011 1003   LDLCALC 66 04/28/2011 1003    Physical Exam:    VS:  BP 136/88   Pulse 70   Ht 6' (1.829 m)   Wt 238 lb (108 kg)   SpO2 96%   BMI 32.28 kg/m     Wt Readings from Last 3 Encounters:  07/14/19 238 lb (108 kg)  04/16/19 238 lb (108 kg)  03/19/19 238 lb (108 kg)     GEN:  Well nourished, well developed in no acute  distress HEENT: Normal NECK: No JVD; No carotid bruits LYMPHATICS: No lymphadenopathy CARDIAC: RRR, no murmurs, rubs, gallops RESPIRATORY:  Clear to auscultation without rales, wheezing or rhonchi  ABDOMEN: Soft, non-tender, non-distended MUSCULOSKELETAL:  No edema; No deformity  SKIN: Warm and dry NEUROLOGIC:  Alert and oriented x 3 PSYCHIATRIC:  Normal affect    Signed, Shirlee More, MD  07/14/2019 1:27 PM    [facility]

## 2019-07-14 ENCOUNTER — Ambulatory Visit (INDEPENDENT_AMBULATORY_CARE_PROVIDER_SITE_OTHER): Payer: Medicare Other | Admitting: Cardiology

## 2019-07-14 ENCOUNTER — Other Ambulatory Visit: Payer: Self-pay

## 2019-07-14 ENCOUNTER — Encounter: Payer: Self-pay | Admitting: Cardiology

## 2019-07-14 VITALS — BP 136/88 | HR 70 | Ht 72.0 in | Wt 238.0 lb

## 2019-07-14 DIAGNOSIS — Z7901 Long term (current) use of anticoagulants: Secondary | ICD-10-CM | POA: Diagnosis not present

## 2019-07-14 DIAGNOSIS — I5042 Chronic combined systolic (congestive) and diastolic (congestive) heart failure: Secondary | ICD-10-CM | POA: Diagnosis not present

## 2019-07-14 DIAGNOSIS — I4819 Other persistent atrial fibrillation: Secondary | ICD-10-CM

## 2019-07-14 DIAGNOSIS — Z950 Presence of cardiac pacemaker: Secondary | ICD-10-CM | POA: Diagnosis not present

## 2019-07-14 DIAGNOSIS — Z952 Presence of prosthetic heart valve: Secondary | ICD-10-CM

## 2019-07-14 DIAGNOSIS — I2510 Atherosclerotic heart disease of native coronary artery without angina pectoris: Secondary | ICD-10-CM | POA: Diagnosis not present

## 2019-07-14 DIAGNOSIS — I110 Hypertensive heart disease with heart failure: Secondary | ICD-10-CM

## 2019-07-14 MED ORDER — NITROGLYCERIN 0.4 MG SL SUBL: 0.4000 mg | SUBLINGUAL_TABLET | SUBLINGUAL | 3 refills | Status: AC | PRN

## 2019-07-14 NOTE — Patient Instructions (Signed)
Medication Instructions:  Your physician recommends that you continue on your current medications as directed. Please refer to the Current Medication list given to you today.  *If you need a refill on your cardiac medications before your next appointment, please call your pharmacy*   Lab Work: Your physician recommends that you return for lab work in: TODAY CMP, Lipids If you have labs (blood work) drawn today and your tests are completely normal, you will receive your results only by: . MyChart Message (if you have MyChart) OR . A paper copy in the mail If you have any lab test that is abnormal or we need to change your treatment, we will call you to review the results.   Testing/Procedures: None   Follow-Up: At [facility], you and your health needs are our priority.  As part of our continuing mission to provide you with exceptional heart care, we have created designated Provider Care Teams.  These Care Teams include your primary Cardiologist (physician) and Advanced Practice Providers (APPs -  Physician Assistants and Nurse Practitioners) who all work together to provide you with the care you need, when you need it.  We recommend signing up for the patient portal called "MyChart".  Sign up information is provided on this After Visit Summary.  MyChart is used to connect with patients for Virtual Visits (Telemedicine).  Patients are able to view lab/test results, encounter notes, upcoming appointments, etc.  Non-urgent messages can be sent to your provider as well.   To learn more about what you can do with MyChart, go to [email or web address].    Your next appointment:   6 month(s)  The format for your next appointment:   In Person  Provider:   Brian Munley, MD   Other Instructions   

## 2019-07-15 ENCOUNTER — Telehealth: Payer: Self-pay

## 2019-07-15 LAB — COMPREHENSIVE METABOLIC PANEL
ALT: 5 IU/L (ref 0–44)
AST: 26 IU/L (ref 0–40)
Albumin/Globulin Ratio: 1.5 (ref 1.2–2.2)
Albumin: 4.1 g/dL (ref 3.6–4.6)
Alkaline Phosphatase: 83 IU/L (ref 48–121)
BUN/Creatinine Ratio: 18 (ref 10–24)
BUN: 30 mg/dL — ABNORMAL HIGH (ref 8–27)
Bilirubin Total: 0.7 mg/dL (ref 0.0–1.2)
CO2: 18 mmol/L — ABNORMAL LOW (ref 20–29)
Calcium: 10.4 mg/dL — ABNORMAL HIGH (ref 8.6–10.2)
Chloride: 109 mmol/L — ABNORMAL HIGH (ref 96–106)
Creatinine, Ser: 1.66 mg/dL — ABNORMAL HIGH (ref 0.76–1.27)
GFR calc Af Amer: 43 mL/min/{1.73_m2} — ABNORMAL LOW (ref >59)
GFR calc non Af Amer: 37 mL/min/{1.73_m2} — ABNORMAL LOW (ref >59)
Globulin, Total: 2.7 g/dL (ref 1.5–4.5)
Glucose: 131 mg/dL — ABNORMAL HIGH (ref 65–99)
Potassium: 4.4 mmol/L (ref 3.5–5.2)
Sodium: 143 mmol/L (ref 134–144)
Total Protein: 6.8 g/dL (ref 6.0–8.5)

## 2019-07-15 LAB — LIPID PANEL
Chol/HDL Ratio: 3.7 ratio (ref 0.0–5.0)
Cholesterol, Total: 118 mg/dL (ref 100–199)
HDL: 32 mg/dL — ABNORMAL LOW (ref >39)
LDL Chol Calc (NIH): 61 mg/dL (ref 0–99)
Triglycerides: 141 mg/dL (ref 0–149)
VLDL Cholesterol Cal: 25 mg/dL (ref 5–40)

## 2019-07-15 NOTE — Telephone Encounter (Signed)
-----   Message from Richardo Priest, MD sent at 07/15/2019  7:33 AM EDT ----- Normal or stable result  No changes

## 2019-07-15 NOTE — Telephone Encounter (Signed)
Spoke with patients daughter regarding results and recommendation.  She verbalizes understanding and is agreeable to plan of care. Advised patient to call back with any issues or concerns.  

## 2019-07-15 NOTE — Telephone Encounter (Signed)
Left message on patients voicemail to please return our call.   

## 2019-07-16 DIAGNOSIS — M9901 Segmental and somatic dysfunction of cervical region: Secondary | ICD-10-CM | POA: Diagnosis not present

## 2019-07-16 DIAGNOSIS — M5417 Radiculopathy, lumbosacral region: Secondary | ICD-10-CM | POA: Diagnosis not present

## 2019-07-16 DIAGNOSIS — M9903 Segmental and somatic dysfunction of lumbar region: Secondary | ICD-10-CM | POA: Diagnosis not present

## 2019-07-16 DIAGNOSIS — M62830 Muscle spasm of back: Secondary | ICD-10-CM | POA: Diagnosis not present

## 2019-07-16 DIAGNOSIS — M546 Pain in thoracic spine: Secondary | ICD-10-CM | POA: Diagnosis not present

## 2019-07-18 ENCOUNTER — Ambulatory Visit (INDEPENDENT_AMBULATORY_CARE_PROVIDER_SITE_OTHER): Payer: Medicare Other | Admitting: *Deleted

## 2019-07-18 ENCOUNTER — Other Ambulatory Visit: Payer: Self-pay

## 2019-07-18 DIAGNOSIS — Z86718 Personal history of other venous thrombosis and embolism: Secondary | ICD-10-CM

## 2019-07-18 DIAGNOSIS — I4819 Other persistent atrial fibrillation: Secondary | ICD-10-CM | POA: Diagnosis not present

## 2019-07-18 LAB — POCT INR: INR: 2.5 (ref 2.0–3.0)

## 2019-07-18 NOTE — Patient Instructions (Signed)
Description   Continue taking 1 tablet daily except for 1/2 tablet on Mondays and Fridays. Recheck in 7 weeks. Call us with any medication changes or concerns. [facility] # [phone] office.

## 2019-07-22 ENCOUNTER — Telehealth: Payer: Self-pay | Admitting: Cardiology

## 2019-07-22 NOTE — Telephone Encounter (Signed)
Spoke to Viola over at [facility] just now and he let me know that the patient does have stage 3 CKD. He believes that this dose of fenofibrate should probably be discontinued or reduces significantly.   Total Cholesterol- 87 Triglycerides- 90 LDL-44 HDL-26  These were in October. Our most recent labs for this patient were on 07/14/19.   Nicki Reaper is wondering if it would be ok to stop this medication due to the patients kidney function?

## 2019-07-22 NOTE — Telephone Encounter (Signed)
Spoke to [place] just now and let him know Dr. Joya Gaskins recommendation. He verbalizes understanding and states that he will let the patient know.    Encouraged patient to call back with any questions or concerns.

## 2019-07-22 NOTE — Telephone Encounter (Signed)
Yes there is a good opportunity to stop his fenofibrate

## 2019-07-22 NOTE — Telephone Encounter (Signed)
Tried Web designer back but was unable to reach him. I left a voicemail for him to please call me back.

## 2019-07-22 NOTE — Telephone Encounter (Signed)
    Pt c/o medication issue:  1. Name of Medication:   fenofibrate 160 MG tablet     2. How are you currently taking this medication (dosage and times per day)? Take 1 tablet (160 mg total) by mouth daily.  3. Are you having a reaction (difficulty breathing--STAT)?   4. What is your medication issue? Scott from [facility] calling, he said he has question about this medication

## 2019-07-25 DIAGNOSIS — N183 Chronic kidney disease, stage 3 unspecified: Secondary | ICD-10-CM | POA: Diagnosis not present

## 2019-07-25 DIAGNOSIS — E7800 Pure hypercholesterolemia, unspecified: Secondary | ICD-10-CM | POA: Diagnosis not present

## 2019-07-25 DIAGNOSIS — I4891 Unspecified atrial fibrillation: Secondary | ICD-10-CM | POA: Diagnosis not present

## 2019-07-25 DIAGNOSIS — M1990 Unspecified osteoarthritis, unspecified site: Secondary | ICD-10-CM | POA: Diagnosis not present

## 2019-07-25 DIAGNOSIS — I10 Essential (primary) hypertension: Secondary | ICD-10-CM | POA: Diagnosis not present

## 2019-07-25 DIAGNOSIS — E785 Hyperlipidemia, unspecified: Secondary | ICD-10-CM | POA: Diagnosis not present

## 2019-07-25 DIAGNOSIS — E038 Other specified hypothyroidism: Secondary | ICD-10-CM | POA: Diagnosis not present

## 2019-07-25 DIAGNOSIS — I2510 Atherosclerotic heart disease of native coronary artery without angina pectoris: Secondary | ICD-10-CM | POA: Diagnosis not present

## 2019-07-25 DIAGNOSIS — C61 Malignant neoplasm of prostate: Secondary | ICD-10-CM | POA: Diagnosis not present

## 2019-07-25 DIAGNOSIS — Z8546 Personal history of malignant neoplasm of prostate: Secondary | ICD-10-CM | POA: Diagnosis not present

## 2019-08-24 ENCOUNTER — Emergency Department (HOSPITAL_COMMUNITY)
Admission: EM | Admit: 2019-08-24 | Discharge: 2019-08-25 | Disposition: A | Discharge status: Home / Self Care | Payer: Medicare Other | Attending: Emergency Medicine | Admitting: Emergency Medicine

## 2019-08-24 ENCOUNTER — Emergency Department (HOSPITAL_COMMUNITY): Payer: Medicare Other

## 2019-08-24 ENCOUNTER — Other Ambulatory Visit: Payer: Self-pay

## 2019-08-24 ENCOUNTER — Encounter (HOSPITAL_COMMUNITY): Payer: Self-pay | Admitting: *Deleted

## 2019-08-24 DIAGNOSIS — Z5321 Procedure and treatment not carried out due to patient leaving prior to being seen by health care provider: Secondary | ICD-10-CM | POA: Diagnosis not present

## 2019-08-24 DIAGNOSIS — I10 Essential (primary) hypertension: Secondary | ICD-10-CM | POA: Diagnosis not present

## 2019-08-24 DIAGNOSIS — R0789 Other chest pain: Secondary | ICD-10-CM | POA: Diagnosis not present

## 2019-08-24 DIAGNOSIS — R079 Chest pain, unspecified: Secondary | ICD-10-CM | POA: Diagnosis not present

## 2019-08-24 DIAGNOSIS — I700 Atherosclerosis of aorta: Secondary | ICD-10-CM | POA: Diagnosis not present

## 2019-08-24 LAB — BASIC METABOLIC PANEL
Anion gap: 8 (ref 5–15)
BUN: 19 mg/dL (ref 8–23)
CO2: 23 mmol/L (ref 22–32)
Calcium: 9.6 mg/dL (ref 8.9–10.3)
Chloride: 108 mmol/L (ref 98–111)
Creatinine, Ser: 1.43 mg/dL — ABNORMAL HIGH (ref 0.61–1.24)
GFR calc Af Amer: 51 mL/min — ABNORMAL LOW (ref >60)
GFR calc non Af Amer: 44 mL/min — ABNORMAL LOW (ref >60)
Glucose, Bld: 110 mg/dL — ABNORMAL HIGH (ref 70–99)
Potassium: 3.9 mmol/L (ref 3.5–5.1)
Sodium: 139 mmol/L (ref 135–145)

## 2019-08-24 LAB — CBC
HCT: 48.6 % (ref 39.0–52.0)
Hemoglobin: 15.7 g/dL (ref 13.0–17.0)
MCH: 27.8 pg (ref 26.0–34.0)
MCHC: 32.3 g/dL (ref 30.0–36.0)
MCV: 86 fL (ref 80.0–100.0)
Platelets: 157 10*3/uL (ref 150–400)
RBC: 5.65 MIL/uL (ref 4.22–5.81)
RDW: 15.3 % (ref 11.5–15.5)
WBC: 4.2 10*3/uL (ref 4.0–10.5)
nRBC: 0 % (ref 0.0–0.2)

## 2019-08-24 LAB — TROPONIN I (HIGH SENSITIVITY): Troponin I (High Sensitivity): 26 ng/L — ABNORMAL HIGH (ref <18)

## 2019-08-24 MED ORDER — SODIUM CHLORIDE 0.9% FLUSH: 3.0000 mL | Freq: Once | INTRAVENOUS | Status: DC

## 2019-08-24 NOTE — ED Notes (Signed)
Pt states he will see his cardiologist in the morning.

## 2019-08-24 NOTE — ED Notes (Signed)
The pt reports that he has sob frequently

## 2019-08-24 NOTE — ED Triage Notes (Signed)
The pt arrived by [facility] from home  Chest pain for  For one hour  No pain now  Pt was given sl nitro and aspirin

## 2019-08-25 ENCOUNTER — Telehealth: Payer: Self-pay | Admitting: Cardiology

## 2019-08-25 NOTE — Telephone Encounter (Signed)
[name] is calling requesting to speak with Dr. Bettina Gavia or his nurse due to recently going to the hospital, but leaving before being fully seen due to them saying it would be a 7 hr wait to see the Doctor. Please advise.

## 2019-08-25 NOTE — Telephone Encounter (Signed)
His Troponin is as abnormal   See Dr Beverely Pace at [facility] force into schedule as I am on hospital in [place] this week

## 2019-08-25 NOTE — Telephone Encounter (Signed)
Pt states that he called EMS yesterday due to having chest pain. EMS told her that his pacemaker "went off". He was taken to the ED. He states that they had an EKG and lab work but he was put back in the waiting room. He was told it would be at least 7 hours before a doctor could see him so he left. He is requesting that Dr. Bettina Gavia review his test results from the ED.  Pt denies any chest pain at this time. States he feels good besides being slightly SOB which is usual for him.

## 2019-08-27 ENCOUNTER — Encounter: Payer: Self-pay | Admitting: Cardiology

## 2019-08-27 ENCOUNTER — Other Ambulatory Visit: Payer: Self-pay

## 2019-08-27 ENCOUNTER — Ambulatory Visit (INDEPENDENT_AMBULATORY_CARE_PROVIDER_SITE_OTHER): Payer: Medicare Other | Admitting: Cardiology

## 2019-08-27 VITALS — BP 128/74 | HR 64 | Ht 72.0 in | Wt 233.0 lb

## 2019-08-27 DIAGNOSIS — I2510 Atherosclerotic heart disease of native coronary artery without angina pectoris: Secondary | ICD-10-CM

## 2019-08-27 DIAGNOSIS — I4819 Other persistent atrial fibrillation: Secondary | ICD-10-CM | POA: Diagnosis not present

## 2019-08-27 DIAGNOSIS — R079 Chest pain, unspecified: Secondary | ICD-10-CM | POA: Diagnosis not present

## 2019-08-27 DIAGNOSIS — Z950 Presence of cardiac pacemaker: Secondary | ICD-10-CM | POA: Diagnosis not present

## 2019-08-27 DIAGNOSIS — E669 Obesity, unspecified: Secondary | ICD-10-CM

## 2019-08-27 DIAGNOSIS — G4733 Obstructive sleep apnea (adult) (pediatric): Secondary | ICD-10-CM | POA: Diagnosis not present

## 2019-08-27 DIAGNOSIS — E782 Mixed hyperlipidemia: Secondary | ICD-10-CM

## 2019-08-27 MED ORDER — ISOSORBIDE MONONITRATE ER 30 MG PO TB24: 15.0000 mg | ORAL_TABLET | Freq: Every day | ORAL | 6 refills | Status: DC

## 2019-08-27 NOTE — H&P (View-Only) (Signed)
Cardiology Office Note:    Date:  08/27/2019   ID:  [name], DOB 10-May-1931, MRN [id number]  PCP:  Shirline Frees, MD  Cardiologist:  Shirlee More, MD  Electrophysiologist:  None   Referring MD: Shirline Frees, MD   Chief Complaint  Patient presents with  . Chest Pain  " I had chest pain on Sunday ended up in the ED but left without seeing a doctor because of the weight  History of Present Illness:    [name] is a 86 y.o. male with a hx of CAD s/p PCI with DES to RCA 03/18/18, severe AS s/p TAVR 03/19/18, persistent atrial fibrillation anticoagulated on Warfarin, recurrent DVT/PE s/p IVC filter, HTN, combined heart failure,and a permanent pacemaker. The patient was seen by Dr. Bettina Gavia on 6/72021 at that time he was doing well.   The patient tells me that he was doing well up until Sunday. He states that on Sunday he was experiencing left sided chest pain. He noted that he it was midsternal with no radiation. He quantified it as a 6/10. He stated that it lasted intermittently for about 3 hours. He then called 911 who recommended that he take 4 baby aspirin. He was then was taken to ED but at the ED he got the trop which was elevated. He had to leave the ED against AMA due to the long weight.   The patient has had some chest tightness since Sunday.   Past Medical History:  Diagnosis Date  . Aortic valve stenosis 04/05/2015   Formatting of this note might be different from the original. Overview:  ECHO 5/15 Formatting of this note might be different from the original. Overview:  Overview:  ECHO 5/15  . Arthritis    "knees, right shoulder" (03/14/2017)  . Atrial fibrillation, persistent ([place]) 09/19/2014  . CAD (coronary artery disease), native coronary artery    Cath 2011 LHC (08/2009):~ Proximal LAD 30%, mid to distal LAD 25%, ostial small D1 75% mid AV groove circumflex 99% been subtotal stenosis, proximal to mid RCA 25-30%, mid RCA 30%, mid PDA 30%, EF 50% with  inferior hypokinesis.;    July 2011  PCI and DES to circumflex Dr. Olevia Perches   ETT-Myoview (06/2013):  Inferolateral scar, mild peri-infarct ischemia, EF 40%; Intermediate Risk   ECHO EF 55% 03/2013   . CAD (coronary artery disease), native coronary artery 04/05/2015   Cath 2011 LHC (08/2009):~ Proximal LAD 30%, mid to distal LAD 25%, ostial small D1 75% mid AV groove circumflex 99% been subtotal stenosis, proximal to mid RCA 25-30%, mid RCA 30%, mid PDA 30%, EF 50% with inferior hypokinesis.;    July 2011  PCI and DES to circumflex Dr. Olevia Perches   ETT-Myoview (06/2013):  Inferolateral scar, mild peri-infarct ischemia, EF 40%; Intermediate Risk   ECHO EF 55% 03/2013    . Cardiac pacemaker in situ 03/14/2017   Biventricular St. Jude inserted 03/14/17 Dr. Lovena Le for second degree heart block   . Chronic combined systolic and diastolic heart failure ([place]) 11/19/2017  . CKD (chronic kidney disease), stage III   . Gastro-esophageal reflux disease without esophagitis 09/24/2009  . GERD   . History of infection of prosthetic knee 04/05/2015   Treated with debridement and irrigation followed by 6 months of triple antibiotics  . History of kidney stones   . History of peptic ulcer 1970s?  Marland Kitchen History of prostate cancer    Radical prostatectomy in 2006 Dr. Terance Hart   . Hypertensive heart disease   .  Internal hemorrhoids 09/11/2018  . Long term (current) use of anticoagulants 10/06/2011  . Mixed hyperlipidemia   . Moderate persistent asthma without complication 2/77/8242  . Noise effect on both inner ears 08/14/2018  . Obesity (BMI 30-39.9)   . OSA on CPAP   . OSA treated with BiPAP 11/15/2015  . Persistent atrial fibrillation ([place]) 09/19/2014   CHA2DS2VASC score 5  Cardioversion 10/08/2014 was on amiodarone until 2018   . 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)    Initial DVT in 2006 after prostate surgery and had [place] filter placed Bilateral  PE 2013 and placed back on warfarin DVT of  right subclavian vein on doppler 10/2012 at time of knee infection   . Personal history of malignant neoplasm of prostate    Radical prostatectomy in 2006 Dr. Terance Hart    . Presbycusis of both ears 08/14/2018  . Severe aortic stenosis   . Severe aortic stenosis 03/18/2018  . Urinary incontinence    MULTIPLE BLADDER SURGERIES - STATES NO URINARY SPHINCTER - PT'S UROLOGIST IS AT [facility]- DR. PETERSON  ( LAST VISIT WAS 09/15/11 )    Past Surgical History:  Procedure Laterality Date  . APPENDECTOMY    . BI-VENTRICULAR PACEMAKER INSERTION (CRT-P)  03/14/2017  . BIV PACEMAKER INSERTION CRT-P N/A 03/14/2017   Procedure: BIV PACEMAKER INSERTION CRT-P;  Surgeon: Evans Lance, MD;  Location: [facility];  Service: Cardiovascular;  Laterality: N/A;  . CARDIOVERSION N/A 10/08/2014   Procedure: CARDIOVERSION;  Surgeon: Jacolyn Reedy, MD;  Location: [facility];  Service: Cardiovascular;  Laterality: N/A;  . CATARACT EXTRACTION W/ INTRAOCULAR LENS  IMPLANT, BILATERAL Bilateral   . CORONARY ANGIOPLASTY WITH STENT PLACEMENT  08/25/2009   DES-mid LCx 08/2009; 30% pLAD, 25% m/dLAD, 75% ostial D1, 99% mLCx s/p DES, 25-30% p/mRCA, 40% mRCA, 30% mPDA stenoses; LVEF 50%, inf hypokinesis  . CORONARY STENT INTERVENTION N/A 03/18/2018   Procedure: CORONARY STENT INTERVENTION;  Surgeon: Sherren Mocha, MD;  Location: [facility];  Service: Cardiovascular;  Laterality: N/A;  . EXCISIONAL HEMORRHOIDECTOMY    . I & D KNEE WITH POLY EXCHANGE Left 10/09/2012   Procedure: IRRIGATION AND DEBRIDEMENT LEFT KNEE WITH POLY REVISION;  Surgeon: Gearlean Alf, MD;  Location: [facility];  Service: Orthopedics;  Laterality: Left;  . INGUINAL HERNIA REPAIR Right   . INTRAOPERATIVE TRANSTHORACIC ECHOCARDIOGRAM  03/19/2018   Procedure: Intraoperative Transthoracic Echocardiogram;  Surgeon: Sherren Mocha, MD;  Location: [facility];  Service: Open Heart Surgery;;  . JOINT REPLACEMENT    . KNEE ARTHROTOMY Left 10/09/2012   Procedure:  LEFT KNEE ARTHROTOMY;  Surgeon: Gearlean Alf, MD;  Location: [facility];  Service: Orthopedics;  Laterality: Left;  . LEFT HEART CATH AND CORONARY ANGIOGRAPHY N/A 02/28/2018   Procedure: LEFT HEART CATH AND CORONARY ANGIOGRAPHY;  Surgeon: Sherren Mocha, MD;  Location: [facility];  Service: Cardiovascular;  Laterality: N/A;  . PROSTATECTOMY  04/25/2004  . REPLACEMENT TOTAL KNEE Bilateral   . SKIN BIOPSY     "off nose; wasn't cancer; it was tested" (03/14/2017)  . TRANSCATHETER AORTIC VALVE REPLACEMENT, TRANSFEMORAL N/A 03/19/2018   Procedure: TRANSCATHETER AORTIC VALVE REPLACEMENT, TRANSFEMORAL;  Surgeon: Sherren Mocha, MD;  Location: [facility];  Service: Open Heart Surgery;  Laterality: N/A;  . Uretheral implants     multiple for incontinence  . VENA CAVA FILTER PLACEMENT      Current Medications: Current Meds  Medication Sig  . acetaminophen (TYLENOL) 500 MG  tablet Take 500 mg by mouth as needed.  Marland Kitchen amLODipine (NORVASC) 2.5 MG tablet Take 2.5 mg by mouth every evening.   . Ascorbic Acid (VITAMIN C) 1000 MG tablet Take 1,000 mg by mouth daily.  . Biotin 2500 MCG CAPS Take 2,500 mcg by mouth daily.  . cholecalciferol (VITAMIN D) 1000 UNITS tablet Take 1,000 Units by mouth daily.  Marland Kitchen docusate sodium (COLACE) 50 MG capsule Take 50 mg by mouth daily.  . furosemide (LASIX) 20 MG tablet Take 20 mg by mouth.  . Glucosamine HCl (GLUCOSAMINE PO) Take 2,000 mg by mouth daily.  . Multiple Vitamin (MULTIVITAMIN) capsule   . nitroGLYCERIN (NITROSTAT) 0.4 MG SL tablet Place 1 tablet (0.4 mg total) under the tongue every 5 (five) minutes as needed.  . Omega-3 Fatty Acids (FISH OIL) 1000 MG CAPS Take 1,000 mg by mouth daily.  . pantoprazole (PROTONIX) 40 MG tablet Take 40 mg by mouth daily.  . pravastatin (PRAVACHOL) 20 MG tablet Take 1 tablet (20 mg total) by mouth daily.  . vitamin B-12 (CYANOCOBALAMIN) 500 MCG tablet Take 500 mcg by mouth daily.  Marland Kitchen warfarin (COUMADIN) 2.5 MG tablet TAKE 1 TABLET  BY MOUTH EVERY DAY  . [DISCONTINUED] Ibuprofen-diphenhydrAMINE Cit (ADVIL PM PO) Take 1 tablet by mouth daily.     Allergies:   Patient has no known allergies.   Social History   Socioeconomic History  . Marital status: Married    Spouse name: Not on file  . Number of children: 3  . Years of education: Not on file  . Highest education level: Not on file  Occupational History  . Occupation: Retired Therapist, nutritional  Tobacco Use  . Smoking status: Former Smoker    Packs/day: 1.00    Years: 10.00    Pack years: 10.00    Types: Cigarettes    Quit date: 04/27/1961    Years since quitting: 58.3  . Smokeless tobacco: Never Used  Vaping Use  . Vaping Use: Never used  Substance and Sexual Activity  . Alcohol use: No    Alcohol/week: 0.0 standard drinks  . Drug use: No  . Sexual activity: Not on file  Other Topics Concern  . Not on file  Social History Narrative  . Not on file   Social Determinants of Health   Financial Resource Strain:   . Difficulty of Paying Living Expenses:   Food Insecurity:   . Worried About Charity fundraiser in the Last Year:   . Arboriculturist in the Last Year:   Transportation Needs:   . Film/video editor (Medical):   Marland Kitchen Lack of Transportation (Non-Medical):   Physical Activity:   . Days of Exercise per Week:   . Minutes of Exercise per Session:   Stress:   . Feeling of Stress :   Social Connections:   . Frequency of Communication with Friends and Family:   . Frequency of Social Gatherings with Friends and Family:   . Attends Religious Services:   . Active Member of Clubs or Organizations:   . Attends Archivist Meetings:   Marland Kitchen Marital Status:      Family History: The patient's family history includes Melanoma in his brother and father.  ROS:   Review of Systems  Constitution: Negative for decreased appetite, fever and weight gain.  HENT: Negative for congestion, ear discharge, hoarse voice and sore throat.   Eyes:  Negative for discharge, redness, vision loss in right eye and visual halos.  Cardiovascular: Negative for chest pain, dyspnea on exertion, leg swelling, orthopnea and palpitations.  Respiratory: Negative for cough, hemoptysis, shortness of breath and snoring.   Endocrine: Negative for heat intolerance and polyphagia.  Hematologic/Lymphatic: Negative for bleeding problem. Does not bruise/bleed easily.  Skin: Negative for flushing, nail changes, rash and suspicious lesions.  Musculoskeletal: Negative for arthritis, joint pain, muscle cramps, myalgias, neck pain and stiffness.  Gastrointestinal: Negative for abdominal pain, bowel incontinence, diarrhea and excessive appetite.  Genitourinary: Negative for decreased libido, genital sores and incomplete emptying.  Neurological: Negative for brief paralysis, focal weakness, headaches and loss of balance.  Psychiatric/Behavioral: Negative for altered mental status, depression and suicidal ideas.  Allergic/Immunologic: Negative for HIV exposure and persistent infections.    EKGs/Labs/Other Studies Reviewed:    The following studies were reviewed today:   EKG:  EKG done on 08/24/2019 showed Ventricular paced rhythm 60 bpm.   Echo IMPRESSIONS  1. 1 year post TAVR, normal transaortic peak/mean gradients 18/9 mmHg, no paravalvular leak.  2. Left ventricular ejection fraction, by estimation, is 50 to 55%. The left ventricle has low normal function. The left ventrical has no regional wall motion abnormalities. Left ventricular diastolic function could not be evaluated.  3. Right ventricular systolic function is normal. The right ventricular size is normal. There is normal pulmonary artery systolic pressure.  4. Left atrial size was moderately dilated. No left atrial/left atrial appendage thrombus was detected.  5. Right atrial size was mildly dilated.  6. The mitral valve is normal in structure and function. no evidence of mitral valve  regurgitation. No evidence of mitral stenosis.  7. No paravalvular leak.. The aortic valve has been repaired/replaced. Aortic valve regurgitation is not visualized. No aortic stenosis is present. 66mm valve is present in the aortic position. Echo findings show normal structure and function of the aortic prosthesis.  8. The inferior vena cava is normal in size with greater than 50% respiratory variability, suggesting right atrial pressure of 3 mmHg.   Recent Labs: 02/13/2019: NT-Pro BNP 1,252 04/16/2019: Magnesium 1.9 07/14/2019: ALT 5 08/24/2019: BUN 19; Creatinine, Ser 1.43; Hemoglobin 15.7; Platelets 157; Potassium 3.9; Sodium 139  Recent Lipid Panel    Component Value Date/Time   CHOL 118 07/14/2019 1343   TRIG 141 07/14/2019 1343   HDL 32 (L) 07/14/2019 1343   CHOLHDL 3.7 07/14/2019 1343   CHOLHDL 3 04/28/2011 1003   VLDL 17.0 04/28/2011 1003   LDLCALC 61 07/14/2019 1343    Physical Exam:    VS:  BP 128/74 (BP Location: Right Arm, Patient Position: Sitting, Cuff Size: Normal)   Pulse 64   Ht 6' (1.829 m)   Wt 233 lb (105.7 kg)   SpO2 97%   BMI 31.60 kg/m     Wt Readings from Last 3 Encounters:  08/27/19 233 lb (105.7 kg)  08/24/19 238 lb 1.6 oz (108 kg)  07/14/19 238 lb (108 kg)     GEN: Well nourished, well developed in no acute distress HEENT: Normal NECK: No JVD; No carotid bruits LYMPHATICS: No lymphadenopathy CARDIAC: S1S2 noted,RRR, no murmurs, rubs, gallops RESPIRATORY:  Clear to auscultation without rales, wheezing or rhonchi  ABDOMEN: Soft, non-tender, non-distended, +bowel sounds, no guarding. EXTREMITIES: No edema, No cyanosis, no clubbing MUSCULOSKELETAL:  No deformity  SKIN: Warm and dry NEUROLOGIC:  Alert and oriented x 3, non-focal PSYCHIATRIC:  Normal affect, good insight  ASSESSMENT:    1. Persistent atrial fibrillation (HCC)   2. Chest pain of uncertain etiology   3.  Coronary artery disease involving native coronary artery of native heart,  angina presence unspecified   4. OSA treated with BiPAP   5. Cardiac pacemaker in situ   6. Obesity (BMI 30-39.9)   7. Mixed hyperlipidemia    PLAN:    Due to concern for CAD with angina and elevated troponin I will like to evaluate the patient for worsening progression of CAD. I have discussed with the patient and his wife. I does have CKD and understands that he will need pre-cath hydration. He will also need to hold his coumadin for about 5 days. I will also add Imdur to his medication regimen.   The patient understands that risks include but are not limited to stroke (1 in 1000), death (1 in 22), kidney failure [usually temporary] (1 in 500), bleeding (1 in 200), allergic reaction [possibly serious] (1 in 200), and agrees to proceed.  I will get blood work today which will include troponin.   Continue patient on bipap.   Pacemaker in situ - last monitor was may 11,2021. We will reach out to our device clinic for getting a remote pacemaker check soon. Plans to also follow up with EP soon.   The patient is in agreement with the above plan. The patient left the office in stable condition.  The patient will follow up in in 1 month with Dr. Bettina Gavia.   Medication Adjustments/Labs and Tests Ordered: Current medicines are reviewed at length with the patient today.  Concerns regarding medicines are outlined above.  Orders Placed This Encounter  Procedures  . INR/PT  . CBC with Differential/Platelet  . Basic Metabolic Panel (BMET)  . Troponin T   Meds ordered this encounter  Medications  . isosorbide mononitrate (IMDUR) 30 MG 24 hr tablet    Sig: Take 0.5 tablets (15 mg total) by mouth daily.    Dispense:  15 tablet    Refill:  6    Patient Instructions  Medication Instructions:  Your physician has recommended you make the following change in your medication:   Start Imdur 15 mg daily.  *If you need a refill on your cardiac medications before your next appointment, please call  your pharmacy*   Lab Work: Your physician recommends that you return for lab work in: 09/08/19 You need to have labs done when you are fasting.  You can come Monday through Friday 8:30 am to 12:00 pm and 1:15 to 4:30. You do not need to make an appointment as the order has already been placed.    If you have labs (blood work) drawn today and your tests are completely normal, you will receive your results only by: Marland Kitchen MyChart Message (if you have MyChart) OR . A paper copy in the mail If you have any lab test that is abnormal or we need to change your treatment, we will call you to review the results.   Testing/Procedures:    [facility] [address], [place] [address] Dept: [phone] Loc: [phone]  Diante P Antolin  08/27/2019  You are scheduled for a Cardiac Catheterization on Friday, August 6 with Dr. Sherren Mocha.  1. Please arrive at the [facility] (Main Entrance A) at [facility]: [address] [place], [place] [address] at 5:30 AM (This time is two hours before your procedure to ensure your preparation). Free valet parking service is available.   Special note: Every effort is made to  have your procedure done on time. Please understand that emergencies sometimes delay scheduled procedures.  2. Diet: Do not eat solid foods after midnight.  The patient may have clear liquids until 5am upon the day of the procedure.  3. Labs: You will need to have blood drawn on Monday, August 2 at [facility]: [address] [place], [place]. You do not need to be fasting.  4. Medication instructions in preparation for your procedure:   Contrast Allergy: No  Stop taking Coumadin (Warfarin) on Sunday, August 1.  Stop taking, Lasix (Furosemide)  Friday, August 6,   On the morning of your procedure, take your Aspirin and any morning medicines NOT listed above.  You  may use sips of water.  5. Plan for one night stay--bring personal belongings. 6. Bring a current list of your medications and current insurance cards. 7. You MUST have a responsible person to drive you home. 8. Someone MUST be with you the first 24 hours after you arrive home or your discharge will be delayed. 9. Please wear clothes that are easy to get on and off and wear slip-on shoes.  Thank you for allowing [place] to care for you!   -- [facility]    Follow-Up: At [facility], you and your health needs are our priority.  As part of our continuing mission to provide you with exceptional heart care, we have created designated Provider Care Teams.  These Care Teams include your primary Cardiologist (physician) and Advanced Practice Providers (APPs -  Physician Assistants and Nurse Practitioners) who all work together to provide you with the care you need, when you need it.  We recommend signing up for the patient portal called "MyChart".  Sign up information is provided on this After Visit Summary.  MyChart is used to connect with patients for Virtual Visits (Telemedicine).  Patients are able to view lab/test results, encounter notes, upcoming appointments, etc.  Non-urgent messages can be sent to your provider as well.   To learn more about what you can do with MyChart, go to [email or web address].    Your next appointment:   1 month(s)  The format for your next appointment:   In Person  Provider:   Shirlee More, MD   Other Instructions Isosorbide Mononitrate extended-release tablets What is this medicine? ISOSORBIDE MONONITRATE (eye soe SOR bide mon oh NYE trate) is a vasodilator. It relaxes blood vessels, increasing the blood and oxygen supply to your heart. This medicine is used to prevent chest pain caused by angina. It will not help to stop an episode of chest pain. This medicine may be used for other purposes; ask your health care provider  or pharmacist if you have questions. COMMON BRAND NAME(S): Imdur, Isotrate ER What should I tell my health care provider before I take this medicine? They need to know if you have any of these conditions:  previous heart attack or heart failure  an unusual or allergic reaction to isosorbide mononitrate, nitrates, other medicines, foods, dyes, or preservatives  pregnant or trying to get pregnant  breast-feeding How should I use this medicine? Take this medicine by mouth with a glass of water. Follow the directions on the prescription label. Do not crush or chew. Take your medicine at regular intervals. Do not take your medicine more often than directed. Do not stop taking this medicine except on the advice of your doctor or health care professional. Talk to your pediatrician regarding the use of this  medicine in children. Special care may be needed. Overdosage: If you think you have taken too much of this medicine contact a poison control center or emergency room at once. NOTE: This medicine is only for you. Do not share this medicine with others. What if I miss a dose? If you miss a dose, take it as soon as you can. If it is almost time for your next dose, take only that dose. Do not take double or extra doses. What may interact with this medicine? Do not take this medicine with any of the following medications:  medicines used to treat erectile dysfunction (ED) like avanafil, sildenafil, tadalafil, and vardenafil  riociguat This medicine may also interact with the following medications:  medicines for high blood pressure  other medicines for angina or heart failure This list may not describe all possible interactions. Give your health care provider a list of all the medicines, herbs, non-prescription drugs, or dietary supplements you use. Also tell them if you smoke, drink alcohol, or use illegal drugs. Some items may interact with your medicine. What should I watch for while using  this medicine? Check your heart rate and blood pressure regularly while you are taking this medicine. Ask your doctor or health care professional what your heart rate and blood pressure should be and when you should contact him or her. Tell your doctor or health care professional if you feel your medicine is no longer working. You may get dizzy. Do not drive, use machinery, or do anything that needs mental alertness until you know how this medicine affects you. To reduce the risk of dizzy or fainting spells, do not sit or stand up quickly, especially if you are an older patient. Alcohol can make you more dizzy, and increase flushing and rapid heartbeats. Avoid alcoholic drinks. Do not treat yourself for coughs, colds, or pain while you are taking this medicine without asking your doctor or health care professional for advice. Some ingredients may increase your blood pressure. What side effects may I notice from receiving this medicine? Side effects that you should report to your doctor or health care professional as soon as possible:  bluish discoloration of lips, fingernails, or palms of hands  irregular heartbeat, palpitations  low blood pressure  nausea, vomiting  persistent headache  unusually weak or tired Side effects that usually do not require medical attention (report to your doctor or health care professional if they continue or are bothersome):  flushing of the face or neck  rash This list may not describe all possible side effects. Call your doctor for medical advice about side effects. You may report side effects to FDA at 1-800-FDA-1088. Where should I keep my medicine? Keep out of the reach of children. Store between 15 and 30 degrees C (59 and 86 degrees F). Keep container tightly closed. Throw away any unused medicine after the expiration date. NOTE: This sheet is a summary. It may not cover all possible information. If you have questions about this medicine, talk to your  doctor, pharmacist, or health care provider.  2020 Elsevier/Gold Standard (2012-11-22 14:48:19)      Adopting a Healthy Lifestyle.  Know what a healthy weight is for you (roughly BMI <25) and aim to maintain this   Aim for 7+ servings of fruits and vegetables daily   65-80+ fluid ounces of water or unsweet tea for healthy kidneys   Limit to max 1 drink of alcohol per day; avoid smoking/tobacco   Limit animal fats in  diet for cholesterol and heart health - choose grass fed whenever available   Avoid highly processed foods, and foods high in saturated/trans fats   Aim for low stress - take time to unwind and care for your mental health   Aim for 150 min of moderate intensity exercise weekly for heart health, and weights twice weekly for bone health   Aim for 7-9 hours of sleep daily   When it comes to diets, agreement about the perfect plan isnt easy to find, even among the experts. Experts at the [place] developed an idea known as the Healthy Eating Plate. Just imagine a plate divided into logical, healthy portions.   The emphasis is on diet quality:   Load up on vegetables and fruits - one-half of your plate: Aim for color and variety, and remember that potatoes dont count.   Go for whole grains - one-quarter of your plate: Whole wheat, barley, wheat berries, quinoa, oats, brown rice, and foods made with them. If you want pasta, go with whole wheat pasta.   Protein power - one-quarter of your plate: Fish, chicken, beans, and nuts are all healthy, versatile protein sources. Limit red meat.   The diet, however, does go beyond the plate, offering a few other suggestions.   Use healthy plant oils, such as olive, canola, soy, corn, sunflower and peanut. Check the labels, and avoid partially hydrogenated oil, which have unhealthy trans fats.   If youre thirsty, drink water. Coffee and tea are good in moderation, but skip sugary drinks and limit milk and  dairy products to one or two daily servings.   The type of carbohydrate in the diet is more important than the amount. Some sources of carbohydrates, such as vegetables, fruits, whole grains, and beans-are healthier than others.   Finally, stay active  Signed, Berniece Salines, DO  08/27/2019 5:41 PM    [facility]

## 2019-08-27 NOTE — Progress Notes (Signed)
Cardiology Office Note:    Date:  08/27/2019   ID:  [name], DOB Apr 25, 1931, MRN [id number]  PCP:  Shirline Frees, MD  Cardiologist:  Shirlee More, MD  Electrophysiologist:  None   Referring MD: Shirline Frees, MD   Chief Complaint  Patient presents with  . Chest Pain  " I had chest pain on Sunday ended up in the ED but left without seeing a doctor because of the weight  History of Present Illness:    [name] is a 86 y.o. male with a hx of CAD s/p PCI with DES to RCA 03/18/18, severe AS s/p TAVR 03/19/18, persistent atrial fibrillation anticoagulated on Warfarin, recurrent DVT/PE s/p IVC filter, HTN, combined heart failure,and a permanent pacemaker. The patient was seen by Dr. Bettina Gavia on 6/72021 at that time he was doing well.   The patient tells me that he was doing well up until Sunday. He states that on Sunday he was experiencing left sided chest pain. He noted that he it was midsternal with no radiation. He quantified it as a 6/10. He stated that it lasted intermittently for about 3 hours. He then called 911 who recommended that he take 4 baby aspirin. He was then was taken to ED but at the ED he got the trop which was elevated. He had to leave the ED against AMA due to the long weight.   The patient has had some chest tightness since Sunday.   Past Medical History:  Diagnosis Date  . Aortic valve stenosis 04/05/2015   Formatting of this note might be different from the original. Overview:  ECHO 5/15 Formatting of this note might be different from the original. Overview:  Overview:  ECHO 5/15  . Arthritis    "knees, right shoulder" (03/14/2017)  . Atrial fibrillation, persistent ([place]) 09/19/2014  . CAD (coronary artery disease), native coronary artery    Cath 2011 [facility] (08/2009):~ Proximal LAD 30%, mid to distal LAD 25%, ostial small D1 75% mid AV groove circumflex 99% been subtotal stenosis, proximal to mid RCA 25-30%, mid RCA 30%, mid PDA 30%, EF 50% with  inferior hypokinesis.;    July 2011  PCI and DES to circumflex Dr. Olevia Perches   ETT-Myoview (06/2013):  Inferolateral scar, mild peri-infarct ischemia, EF 40%; Intermediate Risk   ECHO EF 55% 03/2013   . CAD (coronary artery disease), native coronary artery 04/05/2015   Cath 2011 [facility] (08/2009):~ Proximal LAD 30%, mid to distal LAD 25%, ostial small D1 75% mid AV groove circumflex 99% been subtotal stenosis, proximal to mid RCA 25-30%, mid RCA 30%, mid PDA 30%, EF 50% with inferior hypokinesis.;    July 2011  PCI and DES to circumflex Dr. Olevia Perches   ETT-Myoview (06/2013):  Inferolateral scar, mild peri-infarct ischemia, EF 40%; Intermediate Risk   ECHO EF 55% 03/2013    . Cardiac pacemaker in situ 03/14/2017   Biventricular St. Jude inserted 03/14/17 Dr. Lovena Le for second degree heart block   . Chronic combined systolic and diastolic heart failure ([place]) 11/19/2017  . CKD (chronic kidney disease), stage III   . Gastro-esophageal reflux disease without esophagitis 09/24/2009  . GERD   . History of infection of prosthetic knee 04/05/2015   Treated with debridement and irrigation followed by 6 months of triple antibiotics  . History of kidney stones   . History of peptic ulcer 1970s?  Marland Kitchen History of prostate cancer    Radical prostatectomy in 2006 Dr. Terance Hart   . Hypertensive heart disease   .  Internal hemorrhoids 09/11/2018  . Long term (current) use of anticoagulants 10/06/2011  . Mixed hyperlipidemia   . Moderate persistent asthma without complication 2/68/3419  . Noise effect on both inner ears 08/14/2018  . Obesity (BMI 30-39.9)   . OSA on CPAP   . OSA treated with BiPAP 11/15/2015  . Persistent atrial fibrillation ([place]) 09/19/2014   CHA2DS2VASC score 5  Cardioversion 10/08/2014 was on amiodarone until 2018   . 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)    Initial DVT in 2006 after prostate surgery and had [place] filter placed Bilateral  PE 2013 and placed back on warfarin DVT of  right subclavian vein on doppler 10/2012 at time of knee infection   . Personal history of malignant neoplasm of prostate    Radical prostatectomy in 2006 Dr. Terance Hart    . Presbycusis of both ears 08/14/2018  . Severe aortic stenosis   . Severe aortic stenosis 03/18/2018  . Urinary incontinence    MULTIPLE BLADDER SURGERIES - STATES NO URINARY SPHINCTER - PT'S UROLOGIST IS AT [facility]- DR. PETERSON  ( LAST VISIT WAS 09/15/11 )    Past Surgical History:  Procedure Laterality Date  . APPENDECTOMY    . BI-VENTRICULAR PACEMAKER INSERTION (CRT-P)  03/14/2017  . BIV PACEMAKER INSERTION CRT-P N/A 03/14/2017   Procedure: BIV PACEMAKER INSERTION CRT-P;  Surgeon: Evans Lance, MD;  Location: [facility];  Service: Cardiovascular;  Laterality: N/A;  . CARDIOVERSION N/A 10/08/2014   Procedure: CARDIOVERSION;  Surgeon: Jacolyn Reedy, MD;  Location: [facility];  Service: Cardiovascular;  Laterality: N/A;  . CATARACT EXTRACTION W/ INTRAOCULAR LENS  IMPLANT, BILATERAL Bilateral   . CORONARY ANGIOPLASTY WITH STENT PLACEMENT  08/25/2009   DES-mid LCx 08/2009; 30% pLAD, 25% m/dLAD, 75% ostial D1, 99% mLCx s/p DES, 25-30% p/mRCA, 40% mRCA, 30% mPDA stenoses; LVEF 50%, inf hypokinesis  . CORONARY STENT INTERVENTION N/A 03/18/2018   Procedure: CORONARY STENT INTERVENTION;  Surgeon: Sherren Mocha, MD;  Location: [place] [facility];  Service: Cardiovascular;  Laterality: N/A;  . EXCISIONAL HEMORRHOIDECTOMY    . I & D KNEE WITH POLY EXCHANGE Left 10/09/2012   Procedure: IRRIGATION AND DEBRIDEMENT LEFT KNEE WITH POLY REVISION;  Surgeon: Gearlean Alf, MD;  Location: [facility];  Service: Orthopedics;  Laterality: Left;  . INGUINAL HERNIA REPAIR Right   . INTRAOPERATIVE TRANSTHORACIC ECHOCARDIOGRAM  03/19/2018   Procedure: Intraoperative Transthoracic Echocardiogram;  Surgeon: Sherren Mocha, MD;  Location: [place];  Service: Open Heart Surgery;;  . JOINT REPLACEMENT    . KNEE ARTHROTOMY Left 10/09/2012   Procedure:  LEFT KNEE ARTHROTOMY;  Surgeon: Gearlean Alf, MD;  Location: [facility];  Service: Orthopedics;  Laterality: Left;  . LEFT HEART CATH AND CORONARY ANGIOGRAPHY N/A 02/28/2018   Procedure: LEFT HEART CATH AND CORONARY ANGIOGRAPHY;  Surgeon: Sherren Mocha, MD;  Location: [place] [facility];  Service: Cardiovascular;  Laterality: N/A;  . PROSTATECTOMY  04/25/2004  . REPLACEMENT TOTAL KNEE Bilateral   . SKIN BIOPSY     "off nose; wasn't cancer; it was tested" (03/14/2017)  . TRANSCATHETER AORTIC VALVE REPLACEMENT, TRANSFEMORAL N/A 03/19/2018   Procedure: TRANSCATHETER AORTIC VALVE REPLACEMENT, TRANSFEMORAL;  Surgeon: Sherren Mocha, MD;  Location: [place];  Service: Open Heart Surgery;  Laterality: N/A;  . Uretheral implants     multiple for incontinence  . VENA CAVA FILTER PLACEMENT      Current Medications: Current Meds  Medication Sig  . acetaminophen (TYLENOL) 500 MG  tablet Take 500 mg by mouth as needed.  Marland Kitchen amLODipine (NORVASC) 2.5 MG tablet Take 2.5 mg by mouth every evening.   . Ascorbic Acid (VITAMIN C) 1000 MG tablet Take 1,000 mg by mouth daily.  . Biotin 2500 MCG CAPS Take 2,500 mcg by mouth daily.  . cholecalciferol (VITAMIN D) 1000 UNITS tablet Take 1,000 Units by mouth daily.  Marland Kitchen docusate sodium (COLACE) 50 MG capsule Take 50 mg by mouth daily.  . furosemide (LASIX) 20 MG tablet Take 20 mg by mouth.  . Glucosamine HCl (GLUCOSAMINE PO) Take 2,000 mg by mouth daily.  . Multiple Vitamin (MULTIVITAMIN) capsule   . nitroGLYCERIN (NITROSTAT) 0.4 MG SL tablet Place 1 tablet (0.4 mg total) under the tongue every 5 (five) minutes as needed.  . Omega-3 Fatty Acids (FISH OIL) 1000 MG CAPS Take 1,000 mg by mouth daily.  . pantoprazole (PROTONIX) 40 MG tablet Take 40 mg by mouth daily.  . pravastatin (PRAVACHOL) 20 MG tablet Take 1 tablet (20 mg total) by mouth daily.  . vitamin B-12 (CYANOCOBALAMIN) 500 MCG tablet Take 500 mcg by mouth daily.  Marland Kitchen warfarin (COUMADIN) 2.5 MG tablet TAKE 1 TABLET  BY MOUTH EVERY DAY  . [DISCONTINUED] Ibuprofen-diphenhydrAMINE Cit (ADVIL PM PO) Take 1 tablet by mouth daily.     Allergies:   Patient has no known allergies.   Social History   Socioeconomic History  . Marital status: Married    Spouse name: Not on file  . Number of children: 3  . Years of education: Not on file  . Highest education level: Not on file  Occupational History  . Occupation: Retired Therapist, nutritional  Tobacco Use  . Smoking status: Former Smoker    Packs/day: 1.00    Years: 10.00    Pack years: 10.00    Types: Cigarettes    Quit date: 04/27/1961    Years since quitting: 58.3  . Smokeless tobacco: Never Used  Vaping Use  . Vaping Use: Never used  Substance and Sexual Activity  . Alcohol use: No    Alcohol/week: 0.0 standard drinks  . Drug use: No  . Sexual activity: Not on file  Other Topics Concern  . Not on file  Social History Narrative  . Not on file   Social Determinants of Health   Financial Resource Strain:   . Difficulty of Paying Living Expenses:   Food Insecurity:   . Worried About Charity fundraiser in the Last Year:   . Arboriculturist in the Last Year:   Transportation Needs:   . Film/video editor (Medical):   Marland Kitchen Lack of Transportation (Non-Medical):   Physical Activity:   . Days of Exercise per Week:   . Minutes of Exercise per Session:   Stress:   . Feeling of Stress :   Social Connections:   . Frequency of Communication with Friends and Family:   . Frequency of Social Gatherings with Friends and Family:   . Attends Religious Services:   . Active Member of Clubs or Organizations:   . Attends Archivist Meetings:   Marland Kitchen Marital Status:      Family History: The patient's family history includes Melanoma in his brother and father.  ROS:   Review of Systems  Constitution: Negative for decreased appetite, fever and weight gain.  HENT: Negative for congestion, ear discharge, hoarse voice and sore throat.   Eyes:  Negative for discharge, redness, vision loss in right eye and visual halos.  Cardiovascular: Negative for chest pain, dyspnea on exertion, leg swelling, orthopnea and palpitations.  Respiratory: Negative for cough, hemoptysis, shortness of breath and snoring.   Endocrine: Negative for heat intolerance and polyphagia.  Hematologic/Lymphatic: Negative for bleeding problem. Does not bruise/bleed easily.  Skin: Negative for flushing, nail changes, rash and suspicious lesions.  Musculoskeletal: Negative for arthritis, joint pain, muscle cramps, myalgias, neck pain and stiffness.  Gastrointestinal: Negative for abdominal pain, bowel incontinence, diarrhea and excessive appetite.  Genitourinary: Negative for decreased libido, genital sores and incomplete emptying.  Neurological: Negative for brief paralysis, focal weakness, headaches and loss of balance.  Psychiatric/Behavioral: Negative for altered mental status, depression and suicidal ideas.  Allergic/Immunologic: Negative for HIV exposure and persistent infections.    EKGs/Labs/Other Studies Reviewed:    The following studies were reviewed today:   EKG:  EKG done on 08/24/2019 showed Ventricular paced rhythm 60 bpm.   Echo IMPRESSIONS  1. 1 year post TAVR, normal transaortic peak/mean gradients 18/9 mmHg, no paravalvular leak.  2. Left ventricular ejection fraction, by estimation, is 50 to 55%. The left ventricle has low normal function. The left ventrical has no regional wall motion abnormalities. Left ventricular diastolic function could not be evaluated.  3. Right ventricular systolic function is normal. The right ventricular size is normal. There is normal pulmonary artery systolic pressure.  4. Left atrial size was moderately dilated. No left atrial/left atrial appendage thrombus was detected.  5. Right atrial size was mildly dilated.  6. The mitral valve is normal in structure and function. no evidence of mitral valve  regurgitation. No evidence of mitral stenosis.  7. No paravalvular leak.. The aortic valve has been repaired/replaced. Aortic valve regurgitation is not visualized. No aortic stenosis is present. 71mm valve is present in the aortic position. Echo findings show normal structure and function of the aortic prosthesis.  8. The inferior vena cava is normal in size with greater than 50% respiratory variability, suggesting right atrial pressure of 3 mmHg.   Recent Labs: 02/13/2019: NT-Pro BNP 1,252 04/16/2019: Magnesium 1.9 07/14/2019: ALT 5 08/24/2019: BUN 19; Creatinine, Ser 1.43; Hemoglobin 15.7; Platelets 157; Potassium 3.9; Sodium 139  Recent Lipid Panel    Component Value Date/Time   CHOL 118 07/14/2019 1343   TRIG 141 07/14/2019 1343   HDL 32 (L) 07/14/2019 1343   CHOLHDL 3.7 07/14/2019 1343   CHOLHDL 3 04/28/2011 1003   VLDL 17.0 04/28/2011 1003   LDLCALC 61 07/14/2019 1343    Physical Exam:    VS:  BP 128/74 (BP Location: Right Arm, Patient Position: Sitting, Cuff Size: Normal)   Pulse 64   Ht 6' (1.829 m)   Wt 233 lb (105.7 kg)   SpO2 97%   BMI 31.60 kg/m     Wt Readings from Last 3 Encounters:  08/27/19 233 lb (105.7 kg)  08/24/19 238 lb 1.6 oz (108 kg)  07/14/19 238 lb (108 kg)     GEN: Well nourished, well developed in no acute distress HEENT: Normal NECK: No JVD; No carotid bruits LYMPHATICS: No lymphadenopathy CARDIAC: S1S2 noted,RRR, no murmurs, rubs, gallops RESPIRATORY:  Clear to auscultation without rales, wheezing or rhonchi  ABDOMEN: Soft, non-tender, non-distended, +bowel sounds, no guarding. EXTREMITIES: No edema, No cyanosis, no clubbing MUSCULOSKELETAL:  No deformity  SKIN: Warm and dry NEUROLOGIC:  Alert and oriented x 3, non-focal PSYCHIATRIC:  Normal affect, good insight  ASSESSMENT:    1. Persistent atrial fibrillation (HCC)   2. Chest pain of uncertain etiology   3.  Coronary artery disease involving native coronary artery of native heart,  angina presence unspecified   4. OSA treated with BiPAP   5. Cardiac pacemaker in situ   6. Obesity (BMI 30-39.9)   7. Mixed hyperlipidemia    PLAN:    Due to concern for CAD with angina and elevated troponin I will like to evaluate the patient for worsening progression of CAD. I have discussed with the patient and his wife. I does have CKD and understands that he will need pre-cath hydration. He will also need to hold his coumadin for about 5 days. I will also add Imdur to his medication regimen.   The patient understands that risks include but are not limited to stroke (1 in 1000), death (1 in 38), kidney failure [usually temporary] (1 in 500), bleeding (1 in 200), allergic reaction [possibly serious] (1 in 200), and agrees to proceed.  I will get blood work today which will include troponin.   Continue patient on bipap.   Pacemaker in situ - last monitor was may 11,2021. We will reach out to our device clinic for getting a remote pacemaker check soon. Plans to also follow up with EP soon.   The patient is in agreement with the above plan. The patient left the office in stable condition.  The patient will follow up in in 1 month with Dr. Bettina Gavia.   Medication Adjustments/Labs and Tests Ordered: Current medicines are reviewed at length with the patient today.  Concerns regarding medicines are outlined above.  Orders Placed This Encounter  Procedures  . INR/PT  . CBC with Differential/Platelet  . Basic Metabolic Panel (BMET)  . Troponin T   Meds ordered this encounter  Medications  . isosorbide mononitrate (IMDUR) 30 MG 24 hr tablet    Sig: Take 0.5 tablets (15 mg total) by mouth daily.    Dispense:  15 tablet    Refill:  6    Patient Instructions  Medication Instructions:  Your physician has recommended you make the following change in your medication:   Start Imdur 15 mg daily.  *If you need a refill on your cardiac medications before your next appointment, please call  your pharmacy*   Lab Work: Your physician recommends that you return for lab work in: 09/08/19 You need to have labs done when you are fasting.  You can come Monday through Friday 8:30 am to 12:00 pm and 1:15 to 4:30. You do not need to make an appointment as the order has already been placed.    If you have labs (blood work) drawn today and your tests are completely normal, you will receive your results only by: Marland Kitchen MyChart Message (if you have MyChart) OR . A paper copy in the mail If you have any lab test that is abnormal or we need to change your treatment, we will call you to review the results.   Testing/Procedures:    [facility] [place], [place] [address] Dept: [phone] Loc: [phone]  Alf P Lindfors  08/27/2019  You are scheduled for a Cardiac Catheterization on Friday, August 6 with Dr. Sherren Mocha.  1. Please arrive at the [facility] (Main Entrance A) at [facility]: [address] [place], [place] [address] at 5:30 AM (This time is two hours before your procedure to ensure your preparation). Free valet parking service is available.   Special note: Every effort is made to  have your procedure done on time. Please understand that emergencies sometimes delay scheduled procedures.  2. Diet: Do not eat solid foods after midnight.  The patient may have clear liquids until 5am upon the day of the procedure.  3. Labs: You will need to have blood drawn on Monday, August 2 at [facility]: [address] [place], [place]. You do not need to be fasting.  4. Medication instructions in preparation for your procedure:   Contrast Allergy: No  Stop taking Coumadin (Warfarin) on Sunday, August 1.  Stop taking, Lasix (Furosemide)  Friday, August 6,   On the morning of your procedure, take your Aspirin and any morning medicines NOT listed above.  You  may use sips of water.  5. Plan for one night stay--bring personal belongings. 6. Bring a current list of your medications and current insurance cards. 7. You MUST have a responsible person to drive you home. 8. Someone MUST be with you the first 24 hours after you arrive home or your discharge will be delayed. 9. Please wear clothes that are easy to get on and off and wear slip-on shoes.  Thank you for allowing [place] to care for you!   -- [facility]    Follow-Up: At [facility], you and your health needs are our priority.  As part of our continuing mission to provide you with exceptional heart care, we have created designated Provider Care Teams.  These Care Teams include your primary Cardiologist (physician) and Advanced Practice Providers (APPs -  Physician Assistants and Nurse Practitioners) who all work together to provide you with the care you need, when you need it.  We recommend signing up for the patient portal called "MyChart".  Sign up information is provided on this After Visit Summary.  MyChart is used to connect with patients for Virtual Visits (Telemedicine).  Patients are able to view lab/test results, encounter notes, upcoming appointments, etc.  Non-urgent messages can be sent to your provider as well.   To learn more about what you can do with MyChart, go to [email or web address].    Your next appointment:   1 month(s)  The format for your next appointment:   In Person  Provider:   Shirlee More, MD   Other Instructions Isosorbide Mononitrate extended-release tablets What is this medicine? ISOSORBIDE MONONITRATE (eye soe SOR bide mon oh NYE trate) is a vasodilator. It relaxes blood vessels, increasing the blood and oxygen supply to your heart. This medicine is used to prevent chest pain caused by angina. It will not help to stop an episode of chest pain. This medicine may be used for other purposes; ask your health care provider  or pharmacist if you have questions. COMMON BRAND NAME(S): Imdur, Isotrate ER What should I tell my health care provider before I take this medicine? They need to know if you have any of these conditions:  previous heart attack or heart failure  an unusual or allergic reaction to isosorbide mononitrate, nitrates, other medicines, foods, dyes, or preservatives  pregnant or trying to get pregnant  breast-feeding How should I use this medicine? Take this medicine by mouth with a glass of water. Follow the directions on the prescription label. Do not crush or chew. Take your medicine at regular intervals. Do not take your medicine more often than directed. Do not stop taking this medicine except on the advice of your doctor or health care professional. Talk to your pediatrician regarding the use of this  medicine in children. Special care may be needed. Overdosage: If you think you have taken too much of this medicine contact a poison control center or emergency room at once. NOTE: This medicine is only for you. Do not share this medicine with others. What if I miss a dose? If you miss a dose, take it as soon as you can. If it is almost time for your next dose, take only that dose. Do not take double or extra doses. What may interact with this medicine? Do not take this medicine with any of the following medications:  medicines used to treat erectile dysfunction (ED) like avanafil, sildenafil, tadalafil, and vardenafil  riociguat This medicine may also interact with the following medications:  medicines for high blood pressure  other medicines for angina or heart failure This list may not describe all possible interactions. Give your health care provider a list of all the medicines, herbs, non-prescription drugs, or dietary supplements you use. Also tell them if you smoke, drink alcohol, or use illegal drugs. Some items may interact with your medicine. What should I watch for while using  this medicine? Check your heart rate and blood pressure regularly while you are taking this medicine. Ask your doctor or health care professional what your heart rate and blood pressure should be and when you should contact him or her. Tell your doctor or health care professional if you feel your medicine is no longer working. You may get dizzy. Do not drive, use machinery, or do anything that needs mental alertness until you know how this medicine affects you. To reduce the risk of dizzy or fainting spells, do not sit or stand up quickly, especially if you are an older patient. Alcohol can make you more dizzy, and increase flushing and rapid heartbeats. Avoid alcoholic drinks. Do not treat yourself for coughs, colds, or pain while you are taking this medicine without asking your doctor or health care professional for advice. Some ingredients may increase your blood pressure. What side effects may I notice from receiving this medicine? Side effects that you should report to your doctor or health care professional as soon as possible:  bluish discoloration of lips, fingernails, or palms of hands  irregular heartbeat, palpitations  low blood pressure  nausea, vomiting  persistent headache  unusually weak or tired Side effects that usually do not require medical attention (report to your doctor or health care professional if they continue or are bothersome):  flushing of the face or neck  rash This list may not describe all possible side effects. Call your doctor for medical advice about side effects. You may report side effects to FDA at 1-800-FDA-1088. Where should I keep my medicine? Keep out of the reach of children. Store between 15 and 30 degrees C (59 and 86 degrees F). Keep container tightly closed. Throw away any unused medicine after the expiration date. NOTE: This sheet is a summary. It may not cover all possible information. If you have questions about this medicine, talk to your  doctor, pharmacist, or health care provider.  2020 Elsevier/Gold Standard (2012-11-22 14:48:19)      Adopting a Healthy Lifestyle.  Know what a healthy weight is for you (roughly BMI <25) and aim to maintain this   Aim for 7+ servings of fruits and vegetables daily   65-80+ fluid ounces of water or unsweet tea for healthy kidneys   Limit to max 1 drink of alcohol per day; avoid smoking/tobacco   Limit animal fats in  diet for cholesterol and heart health - choose grass fed whenever available   Avoid highly processed foods, and foods high in saturated/trans fats   Aim for low stress - take time to unwind and care for your mental health   Aim for 150 min of moderate intensity exercise weekly for heart health, and weights twice weekly for bone health   Aim for 7-9 hours of sleep daily   When it comes to diets, agreement about the perfect plan isnt easy to find, even among the experts. Experts at the [employer] developed an idea known as the Healthy Eating Plate. Just imagine a plate divided into logical, healthy portions.   The emphasis is on diet quality:   Load up on vegetables and fruits - one-half of your plate: Aim for color and variety, and remember that potatoes dont count.   Go for whole grains - one-quarter of your plate: Whole wheat, barley, wheat berries, quinoa, oats, brown rice, and foods made with them. If you want pasta, go with whole wheat pasta.   Protein power - one-quarter of your plate: Fish, chicken, beans, and nuts are all healthy, versatile protein sources. Limit red meat.   The diet, however, does go beyond the plate, offering a few other suggestions.   Use healthy plant oils, such as olive, canola, soy, corn, sunflower and peanut. Check the labels, and avoid partially hydrogenated oil, which have unhealthy trans fats.   If youre thirsty, drink water. Coffee and tea are good in moderation, but skip sugary drinks and limit milk and  dairy products to one or two daily servings.   The type of carbohydrate in the diet is more important than the amount. Some sources of carbohydrates, such as vegetables, fruits, whole grains, and beans-are healthier than others.   Finally, stay active  Signed, Berniece Salines, DO  08/27/2019 5:41 PM    [facility]

## 2019-08-27 NOTE — Patient Instructions (Signed)
Medication Instructions:  Your physician has recommended you make the following change in your medication:   Start Imdur 15 mg daily.  *If you need a refill on your cardiac medications before your next appointment, please call your pharmacy*   Lab Work: Your physician recommends that you return for lab work in: 09/08/19 You need to have labs done when you are fasting.  You can come Monday through Friday 8:30 am to 12:00 pm and 1:15 to 4:30. You do not need to make an appointment as the order has already been placed.    If you have labs (blood work) drawn today and your tests are completely normal, you will receive your results only by:  [place] (if you have MyChart) OR  A paper copy in the mail If you have any lab test that is abnormal or we need to change your treatment, we will call you to review the results.   Testing/Procedures:    [facility] [address] [place], [place] [address] Dept: [phone] Loc: [phone]  [name]  08/27/2019  You are scheduled for a Cardiac Catheterization on Friday, August 6 with Dr. Sherren Mocha.  1. Please arrive at the [facility] (Main Entrance A) at [facility]: [address] [place], [place] [address] at 5:30 AM (This time is two hours before your procedure to ensure your preparation). Free valet parking service is available.   Special note: Every effort is made to have your procedure done on time. Please understand that emergencies sometimes delay scheduled procedures.  2. Diet: Do not eat solid foods after midnight.  The patient may have clear liquids until 5am upon the day of the procedure.  3. Labs: You will need to have blood drawn on Monday, August 2 at [facility]: [address] [place], [place]. You do not need to be fasting.  4. Medication instructions in preparation for your procedure:    Contrast Allergy: No  Stop taking Coumadin (Warfarin) on Sunday, August 1.  Stop taking, Lasix (Furosemide)  Friday, August 6,   On the morning of your procedure, take your Aspirin and any morning medicines NOT listed above.  You may use sips of water.  5. Plan for one night stay--bring personal belongings. 6. Bring a current list of your medications and current insurance cards. 7. You MUST have a responsible person to drive you home. 8. Someone MUST be with you the first 24 hours after you arrive home or your discharge will be delayed. 9. Please wear clothes that are easy to get on and off and wear slip-on shoes.  Thank you for allowing [place] to care for you!   -- [facility]    Follow-Up: At [facility], you and your health needs are our priority.  As part of our continuing mission to provide you with exceptional heart care, we have created designated Provider Care Teams.  These Care Teams include your primary Cardiologist (physician) and Advanced Practice Providers (APPs -  Physician Assistants and Nurse Practitioners) who all work together to provide you with the care you need, when you need it.  We recommend signing up for the patient portal called "MyChart".  Sign up information is provided on this After Visit Summary.  MyChart is used to connect with patients for Virtual Visits (Telemedicine).  Patients are able to view lab/test results, encounter notes, upcoming appointments, etc.  Non-urgent messages can be sent to your provider as well.   To learn more about what you can do with MyChart, go to [email or web address].    Your next appointment:   1 month(s)  The format for your next appointment:   In Person  Provider:   Shirlee More, MD   Other Instructions Isosorbide Mononitrate extended-release tablets What is this medicine? ISOSORBIDE MONONITRATE (eye soe SOR bide mon oh NYE trate) is a vasodilator. It relaxes blood vessels,  increasing the blood and oxygen supply to your heart. This medicine is used to prevent chest pain caused by angina. It will not help to stop an episode of chest pain. This medicine may be used for other purposes; ask your health care provider or pharmacist if you have questions. COMMON BRAND NAME(S): Imdur, Isotrate ER What should I tell my health care provider before I take this medicine? They need to know if you have any of these conditions:  previous heart attack or heart failure  an unusual or allergic reaction to isosorbide mononitrate, nitrates, other medicines, foods, dyes, or preservatives  pregnant or trying to get pregnant  breast-feeding How should I use this medicine? Take this medicine by mouth with a glass of water. Follow the directions on the prescription label. Do not crush or chew. Take your medicine at regular intervals. Do not take your medicine more often than directed. Do not stop taking this medicine except on the advice of your doctor or health care professional. Talk to your pediatrician regarding the use of this medicine in children. Special care may be needed. Overdosage: If you think you have taken too much of this medicine contact a poison control center or emergency room at once. NOTE: This medicine is only for you. Do not share this medicine with others. What if I miss a dose? If you miss a dose, take it as soon as you can. If it is almost time for your next dose, take only that dose. Do not take double or extra doses. What may interact with this medicine? Do not take this medicine with any of the following medications:  medicines used to treat erectile dysfunction (ED) like avanafil, sildenafil, tadalafil, and vardenafil  riociguat This medicine may also interact with the following medications:  medicines for high blood pressure  other medicines for angina or heart failure This list may not describe all possible interactions. Give your health care  provider a list of all the medicines, herbs, non-prescription drugs, or dietary supplements you use. Also tell them if you smoke, drink alcohol, or use illegal drugs. Some items may interact with your medicine. What should I watch for while using this medicine? Check your heart rate and blood pressure regularly while you are taking this medicine. Ask your doctor or health care professional what your heart rate and blood pressure should be and when you should contact him or her. Tell your doctor or health care professional if you feel your medicine is no longer working. You may get dizzy. Do not drive, use machinery, or do anything that needs mental alertness until you know how this medicine affects you. To reduce the risk of dizzy or fainting spells, do not sit or stand up quickly, especially if you are an older patient. Alcohol can make you more dizzy, and increase flushing and rapid heartbeats. Avoid alcoholic drinks. Do not treat yourself for coughs, colds, or pain while you are taking this medicine without asking your doctor or health care professional for advice. Some  ingredients may increase your blood pressure. What side effects may I notice from receiving this medicine? Side effects that you should report to your doctor or health care professional as soon as possible:  bluish discoloration of lips, fingernails, or palms of hands  irregular heartbeat, palpitations  low blood pressure  nausea, vomiting  persistent headache  unusually weak or tired Side effects that usually do not require medical attention (report to your doctor or health care professional if they continue or are bothersome):  flushing of the face or neck  rash This list may not describe all possible side effects. Call your doctor for medical advice about side effects. You may report side effects to FDA at 1-800-FDA-1088. Where should I keep my medicine? Keep out of the reach of children. Store between 15 and 30  degrees C (59 and 86 degrees F). Keep container tightly closed. Throw away any unused medicine after the expiration date. NOTE: This sheet is a summary. It may not cover all possible information. If you have questions about this medicine, talk to your doctor, pharmacist, or health care provider.  2020 Elsevier/Gold Standard (2012-11-22 14:48:19)

## 2019-08-28 ENCOUNTER — Other Ambulatory Visit: Payer: Self-pay | Admitting: Cardiology

## 2019-08-28 DIAGNOSIS — I2510 Atherosclerotic heart disease of native coronary artery without angina pectoris: Secondary | ICD-10-CM

## 2019-08-28 LAB — TROPONIN T: Troponin T (Highly Sensitive): 29 ng/L (ref 0–22)

## 2019-08-29 ENCOUNTER — Telehealth: Payer: Self-pay

## 2019-08-29 NOTE — Telephone Encounter (Signed)
-----   Message from Berniece Salines, DO sent at 08/29/2019  8:49 AM EDT ----- Let the patient know that his troponin is still elevated.  If he still having chest pain I recommend he go to the emergency department, if not please make sure he keeps his appointment for his left heart catheterization.  In addition I do not see a follow-up for Dr. Bettina Gavia in a month for the patient please make sure the patient get this scheduled.

## 2019-08-29 NOTE — Telephone Encounter (Signed)
Spoke with patient regarding results and recommendation.  Patient verbalizes understanding and is agreeable to plan of care. Advised patient to call back with any issues or concerns.  

## 2019-09-01 ENCOUNTER — Other Ambulatory Visit: Payer: Self-pay | Admitting: Cardiology

## 2019-09-02 ENCOUNTER — Other Ambulatory Visit: Payer: Self-pay | Admitting: Cardiology

## 2019-09-03 ENCOUNTER — Encounter: Payer: Self-pay | Admitting: Internal Medicine

## 2019-09-03 ENCOUNTER — Ambulatory Visit (INDEPENDENT_AMBULATORY_CARE_PROVIDER_SITE_OTHER): Payer: Medicare Other | Admitting: Internal Medicine

## 2019-09-03 ENCOUNTER — Other Ambulatory Visit: Payer: Self-pay

## 2019-09-03 VITALS — BP 128/80 | HR 63 | Ht 72.0 in | Wt 235.0 lb

## 2019-09-03 DIAGNOSIS — I4819 Other persistent atrial fibrillation: Secondary | ICD-10-CM | POA: Diagnosis not present

## 2019-09-03 DIAGNOSIS — I25119 Atherosclerotic heart disease of native coronary artery with unspecified angina pectoris: Secondary | ICD-10-CM | POA: Diagnosis not present

## 2019-09-03 DIAGNOSIS — Z950 Presence of cardiac pacemaker: Secondary | ICD-10-CM | POA: Diagnosis not present

## 2019-09-03 DIAGNOSIS — I4729 Other ventricular tachycardia: Secondary | ICD-10-CM

## 2019-09-03 DIAGNOSIS — I441 Atrioventricular block, second degree: Secondary | ICD-10-CM

## 2019-09-03 DIAGNOSIS — I472 Ventricular tachycardia: Secondary | ICD-10-CM | POA: Diagnosis not present

## 2019-09-03 LAB — CUP PACEART INCLINIC DEVICE CHECK
Battery Remaining Longevity: 64 mo
Battery Voltage: 2.96 V
Brady Statistic RA Percent Paced: 0 %
Brady Statistic RV Percent Paced: 98 %
Date Time Interrogation Session: 20210728095100
Implantable Lead Implant Date: 20190206
Implantable Lead Implant Date: 20190206
Implantable Lead Implant Date: 20190206
Implantable Lead Location: 753858
Implantable Lead Location: 753859
Implantable Lead Location: 753860
Implantable Pulse Generator Implant Date: 20190206
Lead Channel Impedance Value: 437.5 Ohm
Lead Channel Impedance Value: 475 Ohm
Lead Channel Impedance Value: 625 Ohm
Lead Channel Pacing Threshold Amplitude: 0.625 V
Lead Channel Pacing Threshold Amplitude: 2.5 V
Lead Channel Pacing Threshold Pulse Width: 0.4 ms
Lead Channel Pacing Threshold Pulse Width: 0.8 ms
Lead Channel Sensing Intrinsic Amplitude: 12 mV
Lead Channel Sensing Intrinsic Amplitude: 2.7 mV
Lead Channel Setting Pacing Amplitude: 1.375
Lead Channel Setting Pacing Amplitude: 2 V
Lead Channel Setting Pacing Amplitude: 3.25 V
Lead Channel Setting Pacing Pulse Width: 0.4 ms
Lead Channel Setting Pacing Pulse Width: 0.8 ms
Lead Channel Setting Sensing Sensitivity: 2 mV
Pulse Gen Model: 3262
Pulse Gen Serial Number: 8995812

## 2019-09-03 MED ORDER — AMLODIPINE BESYLATE 2.5 MG PO TABS: 2.5000 mg | ORAL_TABLET | Freq: Every day | ORAL | 3 refills | Status: DC

## 2019-09-03 NOTE — Telephone Encounter (Signed)
Called and spoke with patient for clarification on a refill we received for Furosemide from [facility].  Patient informed me he takes the Furosemide 20 mg daily but has plenty of the medication at home.  He actually needs a refill of Amlodipine 2.5 mg.  [facility] per patient request.

## 2019-09-03 NOTE — Telephone Encounter (Signed)
Error

## 2019-09-03 NOTE — Patient Instructions (Signed)
Medication Instructions:  Your physician recommends that you continue on your current medications as directed. Please refer to the Current Medication list given to you today.  *If you need a refill on your cardiac medications before your next appointment, please call your pharmacy*  Lab Work: None ordered.  If you have labs (blood work) drawn today and your tests are completely normal, you will receive your results only by:  [place] (if you have MyChart) OR  A paper copy in the mail If you have any lab test that is abnormal or we need to change your treatment, we will call you to review the results.  Testing/Procedures: None ordered.  Follow-Up: At [facility], you and your health needs are our priority.  As part of our continuing mission to provide you with exceptional heart care, we have created designated Provider Care Teams.  These Care Teams include your primary Cardiologist (physician) and Advanced Practice Providers (APPs -  Physician Assistants and Nurse Practitioners) who all work together to provide you with the care you need, when you need it.  We recommend signing up for the patient portal called "MyChart".  Sign up information is provided on this After Visit Summary.  MyChart is used to connect with patients for Virtual Visits (Telemedicine).  Patients are able to view lab/test results, encounter notes, upcoming appointments, etc.  Non-urgent messages can be sent to your provider as well.   To learn more about what you can do with MyChart, go to [email or web address].    Your next appointment:   Your physician wants you to follow-up in: 1 year with Dr. Lovena Le.  You will receive a reminder letter in the mail two months in advance. If you don't receive a letter, please call our office to schedule the follow-up appointment.  Remote monitoring is used to monitor your Pacemaker from home. This monitoring reduces the number of office visits required to check your device  to one time per year. It allows [place] to keep an eye on the functioning of your device to ensure it is working properly. You are scheduled for a device check from home on 09/16/19. You may send your transmission at any time that day. If you have a wireless device, the transmission will be sent automatically. After your physician reviews your transmission, you will receive a postcard with your next transmission date.  Other Instructions:

## 2019-09-03 NOTE — Progress Notes (Signed)
HPI Mr. [name] returns today for followup. He is a pleasant 87 yo man with CHB, s/p PPM insertion, who also had CAD, s/p PCI to the RCA and s/p TAVR in 2/21. He has had atypical chest pain and sob. He is pending left heart cath. He was in the ED with the pain and left AMA after his initial markesrs were unrevealing. He denies syncope.  No Known Allergies   Current Outpatient Medications  Medication Sig Dispense Refill  . acetaminophen (TYLENOL) 500 MG tablet Take 500 mg by mouth at bedtime.     . Ascorbic Acid (VITAMIN C) 1000 MG tablet Take 1,000 mg by mouth daily.    . Biotin 2500 MCG CAPS Take 2,500 mcg by mouth daily.    . cholecalciferol (VITAMIN D) 1000 UNITS tablet Take 1,000 Units by mouth daily.    Marland Kitchen docusate sodium (COLACE) 50 MG capsule Take 50 mg by mouth daily.    . furosemide (LASIX) 20 MG tablet Take 20 mg by mouth daily.     . Glucosamine HCl (GLUCOSAMINE PO) Take 2,000 mg by mouth daily.    . isosorbide mononitrate (IMDUR) 30 MG 24 hr tablet Take 0.5 tablets (15 mg total) by mouth daily. 15 tablet 6  . Multiple Vitamin (MULTIVITAMIN) capsule Take 1 capsule by mouth daily.     . nitroGLYCERIN (NITROSTAT) 0.4 MG SL tablet Place 1 tablet (0.4 mg total) under the tongue every 5 (five) minutes as needed. 25 tablet 3  . Omega-3 Fatty Acids (FISH OIL) 1000 MG CAPS Take 1,000 mg by mouth daily.    . pantoprazole (PROTONIX) 40 MG tablet Take 40 mg by mouth daily.    . pravastatin (PRAVACHOL) 20 MG tablet TAKE 1 TABLET BY MOUTH EVERY DAY 90 tablet 3  . vitamin B-12 (CYANOCOBALAMIN) 500 MCG tablet Take 500 mcg by mouth daily.    Marland Kitchen warfarin (COUMADIN) 2.5 MG tablet TAKE 1 TABLET BY MOUTH EVERY DAY 90 tablet 1  . amLODipine (NORVASC) 2.5 MG tablet Take 1 tablet (2.5 mg total) by mouth daily. 90 tablet 3   No current facility-administered medications for this visit.     Past Medical History:  Diagnosis Date  . Aortic valve stenosis 04/05/2015   Formatting of this note  might be different from the original. Overview:  ECHO 5/15 Formatting of this note might be different from the original. Overview:  Overview:  ECHO 5/15  . Arthritis    "knees, right shoulder" (03/14/2017)  . Atrial fibrillation, persistent ([place]) 09/19/2014  . CAD (coronary artery disease), native coronary artery    Cath 2011 [facility] (08/2009):~ Proximal LAD 30%, mid to distal LAD 25%, ostial small D1 75% mid AV groove circumflex 99% been subtotal stenosis, proximal to mid RCA 25-30%, mid RCA 30%, mid PDA 30%, EF 50% with inferior hypokinesis.;    July 2011  PCI and DES to circumflex Dr. Olevia Perches   ETT-Myoview (06/2013):  Inferolateral scar, mild peri-infarct ischemia, EF 40%; Intermediate Risk   ECHO EF 55% 03/2013   . CAD (coronary artery disease), native coronary artery 04/05/2015   Cath 2011 [facility] (08/2009):~ Proximal LAD 30%, mid to distal LAD 25%, ostial small D1 75% mid AV groove circumflex 99% been subtotal stenosis, proximal to mid RCA 25-30%, mid RCA 30%, mid PDA 30%, EF 50% with inferior hypokinesis.;    July 2011  PCI and DES to circumflex Dr. Olevia Perches   ETT-Myoview (06/2013):  Inferolateral scar, mild peri-infarct ischemia, EF 40%; Intermediate Risk  ECHO EF 55% 03/2013    . Cardiac pacemaker in situ 03/14/2017   Biventricular [employer] Jude inserted 03/14/17 Dr. Lovena Le for second degree heart block   . Chronic combined systolic and diastolic heart failure ([place]) 11/19/2017  . CKD (chronic kidney disease), stage III   . Gastro-esophageal reflux disease without esophagitis 09/24/2009  . GERD   . History of infection of prosthetic knee 04/05/2015   Treated with debridement and irrigation followed by 6 months of triple antibiotics  . History of kidney stones   . History of peptic ulcer 1970s?  Marland Kitchen History of prostate cancer    Radical prostatectomy in 2006 Dr. Terance Hart   . Hypertensive heart disease   . Internal hemorrhoids 09/11/2018  . Long term (current) use of anticoagulants 10/06/2011  . Mixed hyperlipidemia     . Moderate persistent asthma without complication 2/83/6629  . Noise effect on both inner ears 08/14/2018  . Obesity (BMI 30-39.9)   . OSA on CPAP   . OSA treated with BiPAP 11/15/2015  . Persistent atrial fibrillation ([place]) 09/19/2014   CHA2DS2VASC score 5  Cardioversion 10/08/2014 was on amiodarone until 2018   . 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)    Initial DVT in 2006 after prostate surgery and had [place] filter placed Bilateral  PE 2013 and placed back on warfarin DVT of right subclavian vein on doppler 10/2012 at time of knee infection   . Personal history of malignant neoplasm of prostate    Radical prostatectomy in 2006 Dr. Terance Hart    . Presbycusis of both ears 08/14/2018  . Severe aortic stenosis   . Severe aortic stenosis 03/18/2018  . Urinary incontinence    MULTIPLE BLADDER SURGERIES - STATES NO URINARY SPHINCTER - PT'S UROLOGIST IS AT [facility]- DR. PETERSON  ( LAST VISIT WAS 09/15/11 )    ROS:   All systems reviewed and negative except as noted in the HPI.   Past Surgical History:  Procedure Laterality Date  . APPENDECTOMY    . BI-VENTRICULAR PACEMAKER INSERTION (CRT-P)  03/14/2017  . BIV PACEMAKER INSERTION CRT-P N/A 03/14/2017   Procedure: BIV PACEMAKER INSERTION CRT-P;  Surgeon: Evans Lance, MD;  Location: [place] [facility];  Service: Cardiovascular;  Laterality: N/A;  . CARDIOVERSION N/A 10/08/2014   Procedure: CARDIOVERSION;  Surgeon: Jacolyn Reedy, MD;  Location: [facility] ENDOSCOPY;  Service: Cardiovascular;  Laterality: N/A;  . CATARACT EXTRACTION W/ INTRAOCULAR LENS  IMPLANT, BILATERAL Bilateral   . CORONARY ANGIOPLASTY WITH STENT PLACEMENT  08/25/2009   DES-mid LCx 08/2009; 30% pLAD, 25% m/dLAD, 75% ostial D1, 99% mLCx s/p DES, 25-30% p/mRCA, 40% mRCA, 30% mPDA stenoses; LVEF 50%, inf hypokinesis  . CORONARY STENT INTERVENTION N/A 03/18/2018   Procedure: CORONARY STENT INTERVENTION;  Surgeon: Sherren Mocha, MD;  Location: [place]  CV [facility];  Service: Cardiovascular;  Laterality: N/A;  . EXCISIONAL HEMORRHOIDECTOMY    . I & D KNEE WITH POLY EXCHANGE Left 10/09/2012   Procedure: IRRIGATION AND DEBRIDEMENT LEFT KNEE WITH POLY REVISION;  Surgeon: Gearlean Alf, MD;  Location: [facility];  Service: Orthopedics;  Laterality: Left;  . INGUINAL HERNIA REPAIR Right   . INTRAOPERATIVE TRANSTHORACIC ECHOCARDIOGRAM  03/19/2018   Procedure: Intraoperative Transthoracic Echocardiogram;  Surgeon: Sherren Mocha, MD;  Location: [place];  Service: Open Heart Surgery;;  . JOINT REPLACEMENT    . KNEE ARTHROTOMY Left 10/09/2012   Procedure: LEFT KNEE ARTHROTOMY;  Surgeon: Gearlean Alf, MD;  Location: [facility];  Service: Orthopedics;  Laterality: Left;  . LEFT HEART CATH AND CORONARY ANGIOGRAPHY N/A 02/28/2018   Procedure: LEFT HEART CATH AND CORONARY ANGIOGRAPHY;  Surgeon: Sherren Mocha, MD;  Location: [place] [facility];  Service: Cardiovascular;  Laterality: N/A;  . PROSTATECTOMY  04/25/2004  . REPLACEMENT TOTAL KNEE Bilateral   . SKIN BIOPSY     "off nose; wasn't cancer; it was tested" (03/14/2017)  . TRANSCATHETER AORTIC VALVE REPLACEMENT, TRANSFEMORAL N/A 03/19/2018   Procedure: TRANSCATHETER AORTIC VALVE REPLACEMENT, TRANSFEMORAL;  Surgeon: Sherren Mocha, MD;  Location: [place];  Service: Open Heart Surgery;  Laterality: N/A;  . Uretheral implants     multiple for incontinence  . VENA CAVA FILTER PLACEMENT       Family History  Problem Relation Age of Onset  . Melanoma Father   . Melanoma Brother      Social History   Socioeconomic History  . Marital status: Married    Spouse name: Not on file  . Number of children: 3  . Years of education: Not on file  . Highest education level: Not on file  Occupational History  . Occupation: Retired Therapist, nutritional  Tobacco Use  . Smoking status: Former Smoker    Packs/day: 1.00    Years: 10.00    Pack years: 10.00    Types: Cigarettes    Quit date: 04/27/1961    Years since quitting:  58.3  . Smokeless tobacco: Never Used  Vaping Use  . Vaping Use: Never used  Substance and Sexual Activity  . Alcohol use: No    Alcohol/week: 0.0 standard drinks  . Drug use: No  . Sexual activity: Not on file  Other Topics Concern  . Not on file  Social History Narrative  . Not on file   Social Determinants of Health   Financial Resource Strain:   . Difficulty of Paying Living Expenses:   Food Insecurity:   . Worried About Charity fundraiser in the Last Year:   . Arboriculturist in the Last Year:   Transportation Needs:   . Film/video editor (Medical):   Marland Kitchen Lack of Transportation (Non-Medical):   Physical Activity:   . Days of Exercise per Week:   . Minutes of Exercise per Session:   Stress:   . Feeling of Stress :   Social Connections:   . Frequency of Communication with Friends and Family:   . Frequency of Social Gatherings with Friends and Family:   . Attends Religious Services:   . Active Member of Clubs or Organizations:   . Attends Archivist Meetings:   Marland Kitchen Marital Status:   Intimate Partner Violence:   . Fear of Current or Ex-Partner:   . Emotionally Abused:   Marland Kitchen Physically Abused:   . Sexually Abused:      BP 128/80   Pulse 63   Ht 6' (1.829 m)   Wt (!) 235 lb (106.6 kg)   SpO2 92%   BMI 31.87 kg/m   Physical Exam:  Well appearing NAD HEENT: Unremarkable Neck:  No JVD, no thyromegally Lymphatics:  No adenopathy Back:  No CVA tenderness Lungs:  Clear with no wheezes HEART:  Regular rate rhythm, no murmurs, no rubs, no clicks Abd:  soft, positive bowel sounds, no organomegally, no rebound, no guarding Ext:  2 plus pulses, no edema, no cyanosis, no clubbing Skin:  No rashes no nodules Neuro:  CN II through XII intact, motor grossly intact  DEVICE  Normal device  function.  See PaceArt for details.   Assess/Plan: 1. CHB - he has no escape at 30 today. He is asymptomatic. 2. PPM - his medtronic DDD PM is working normally. 3.  Chest pain - he is pending left heart cath. 4. Coags - he will hold his coumadin several days before the heart cath.   [email or web address] [email or web address].

## 2019-09-05 ENCOUNTER — Other Ambulatory Visit: Payer: Self-pay

## 2019-09-05 ENCOUNTER — Ambulatory Visit (INDEPENDENT_AMBULATORY_CARE_PROVIDER_SITE_OTHER): Payer: Medicare Other | Admitting: *Deleted

## 2019-09-05 ENCOUNTER — Telehealth: Payer: Self-pay | Admitting: Pharmacist

## 2019-09-05 DIAGNOSIS — Z5181 Encounter for therapeutic drug level monitoring: Secondary | ICD-10-CM

## 2019-09-05 DIAGNOSIS — I4819 Other persistent atrial fibrillation: Secondary | ICD-10-CM | POA: Diagnosis not present

## 2019-09-05 DIAGNOSIS — Z86718 Personal history of other venous thrombosis and embolism: Secondary | ICD-10-CM

## 2019-09-05 LAB — POCT INR: INR: 1.8 — AB (ref 2.0–3.0)

## 2019-09-05 NOTE — Telephone Encounter (Signed)
That would be fine with me thank you.

## 2019-09-05 NOTE — Telephone Encounter (Signed)
We saw patient today in coumadin clinic. He is to have a cath on Friday. Has hx of afib and recurrent VTE (initial DVT in 2006 after prostate surgery, bilateral PE in 2013, DVT of R subclavian vein in 2014 at time of knee infection).   Patient was instructed to hold warfarin 5 day. Would typically bridge patient in this situation, but it appears he has held in the past without a bridge.  Has IVC filter and hx of large hematoma after TAVR  Dr. Harriet Masson are you ok with patient holding without a bridge?

## 2019-09-05 NOTE — Patient Instructions (Addendum)
Description    Take 1 tablet today and then continue taking 1 tablet daily except for 1/2 tablet on Mondays and Fridays. Start holding warfarin on 8/1 per Dr. Quintella Reichert instructions. Resume Coumadin in the evening (8/6) or as directed by doctor (take an extra half tablet with usual dose for 2 days then resume normal dose). Recheck INR 1 week post procedure.  Call us with any medication changes or concerns. [facility] [phone] office.

## 2019-09-05 NOTE — Telephone Encounter (Signed)
Denied refill request of Furosemide per patient request on 09-03-19 that he had plenty of this medication currently at home and did not want this filled.  I sent a note to the pharmacy explaining this to them.

## 2019-09-08 ENCOUNTER — Other Ambulatory Visit: Payer: Self-pay | Admitting: Emergency Medicine

## 2019-09-08 DIAGNOSIS — E038 Other specified hypothyroidism: Secondary | ICD-10-CM | POA: Diagnosis not present

## 2019-09-08 DIAGNOSIS — M1990 Unspecified osteoarthritis, unspecified site: Secondary | ICD-10-CM | POA: Diagnosis not present

## 2019-09-08 DIAGNOSIS — N183 Chronic kidney disease, stage 3 unspecified: Secondary | ICD-10-CM | POA: Diagnosis not present

## 2019-09-08 DIAGNOSIS — I4819 Other persistent atrial fibrillation: Secondary | ICD-10-CM

## 2019-09-08 DIAGNOSIS — E785 Hyperlipidemia, unspecified: Secondary | ICD-10-CM | POA: Diagnosis not present

## 2019-09-08 DIAGNOSIS — I4891 Unspecified atrial fibrillation: Secondary | ICD-10-CM | POA: Diagnosis not present

## 2019-09-08 DIAGNOSIS — R079 Chest pain, unspecified: Secondary | ICD-10-CM | POA: Diagnosis not present

## 2019-09-08 DIAGNOSIS — E7800 Pure hypercholesterolemia, unspecified: Secondary | ICD-10-CM | POA: Diagnosis not present

## 2019-09-08 DIAGNOSIS — C61 Malignant neoplasm of prostate: Secondary | ICD-10-CM | POA: Diagnosis not present

## 2019-09-08 DIAGNOSIS — Z8546 Personal history of malignant neoplasm of prostate: Secondary | ICD-10-CM | POA: Diagnosis not present

## 2019-09-08 DIAGNOSIS — I2510 Atherosclerotic heart disease of native coronary artery without angina pectoris: Secondary | ICD-10-CM | POA: Diagnosis not present

## 2019-09-08 DIAGNOSIS — I10 Essential (primary) hypertension: Secondary | ICD-10-CM | POA: Diagnosis not present

## 2019-09-09 ENCOUNTER — Telehealth: Payer: Self-pay

## 2019-09-09 ENCOUNTER — Telehealth: Payer: Self-pay | Admitting: *Deleted

## 2019-09-09 LAB — CBC WITH DIFFERENTIAL/PLATELET
Basophils Absolute: 0 10*3/uL (ref 0.0–0.2)
Basos: 1 %
EOS (ABSOLUTE): 0.2 10*3/uL (ref 0.0–0.4)
Eos: 3 %
Hematocrit: 49.1 % (ref 37.5–51.0)
Hemoglobin: 16.2 g/dL (ref 13.0–17.7)
Immature Grans (Abs): 0 10*3/uL (ref 0.0–0.1)
Immature Granulocytes: 0 %
Lymphocytes Absolute: 1.4 10*3/uL (ref 0.7–3.1)
Lymphs: 25 %
MCH: 27.8 pg (ref 26.6–33.0)
MCHC: 33 g/dL (ref 31.5–35.7)
MCV: 84 fL (ref 79–97)
Monocytes Absolute: 0.5 10*3/uL (ref 0.1–0.9)
Monocytes: 8 %
Neutrophils Absolute: 3.5 10*3/uL (ref 1.4–7.0)
Neutrophils: 63 %
Platelets: 166 10*3/uL (ref 150–450)
RBC: 5.82 x10E6/uL — ABNORMAL HIGH (ref 4.14–5.80)
RDW: 14.5 % (ref 11.6–15.4)
WBC: 5.6 10*3/uL (ref 3.4–10.8)

## 2019-09-09 LAB — BASIC METABOLIC PANEL
BUN/Creatinine Ratio: 15 (ref 10–24)
BUN: 22 mg/dL (ref 8–27)
CO2: 22 mmol/L (ref 20–29)
Calcium: 10 mg/dL (ref 8.6–10.2)
Chloride: 106 mmol/L (ref 96–106)
Creatinine, Ser: 1.42 mg/dL — ABNORMAL HIGH (ref 0.76–1.27)
GFR calc Af Amer: 51 mL/min/{1.73_m2} — ABNORMAL LOW (ref >59)
GFR calc non Af Amer: 44 mL/min/{1.73_m2} — ABNORMAL LOW (ref >59)
Glucose: 107 mg/dL — ABNORMAL HIGH (ref 65–99)
Potassium: 4.1 mmol/L (ref 3.5–5.2)
Sodium: 141 mmol/L (ref 134–144)

## 2019-09-09 LAB — PROTIME-INR
INR: 1.8 — ABNORMAL HIGH (ref 0.9–1.2)
Prothrombin Time: 18 s — ABNORMAL HIGH (ref 9.1–12.0)

## 2019-09-09 NOTE — Telephone Encounter (Signed)
Spoke with patient regarding results and recommendation.  Patient verbalizes understanding and is agreeable to plan of care. Advised patient to call back with any issues or concerns.  

## 2019-09-09 NOTE — Telephone Encounter (Signed)
No answer, voice mail 

## 2019-09-09 NOTE — Telephone Encounter (Signed)
-----   Message from Berniece Salines, DO sent at 09/09/2019  8:01 AM EDT ----- Creatinine stable.  Patient will get hydration prior to his left heart catheterization.

## 2019-09-09 NOTE — Telephone Encounter (Signed)
LM to call back for patient to discuss scheduling pre-procedure COVID-19 prior to [facility] 09/12/19.

## 2019-09-10 ENCOUNTER — Other Ambulatory Visit (HOSPITAL_COMMUNITY)
Admission: RE | Admit: 2019-09-10 | Discharge: 2019-09-10 | Disposition: A | Discharge status: Home / Self Care | Payer: Medicare Other | Source: Ambulatory Visit | Attending: Cardiovascular Disease | Admitting: Cardiovascular Disease

## 2019-09-10 DIAGNOSIS — Z20822 Contact with and (suspected) exposure to covid-19: Secondary | ICD-10-CM | POA: Insufficient documentation

## 2019-09-10 DIAGNOSIS — Z01812 Encounter for preprocedural laboratory examination: Secondary | ICD-10-CM | POA: Diagnosis not present

## 2019-09-10 LAB — SARS CORONAVIRUS 2 (TAT 6-24 HRS): SARS Coronavirus 2: NEGATIVE

## 2019-09-10 NOTE — Telephone Encounter (Signed)
LMTCB for pt to review procedure instructions 

## 2019-09-10 NOTE — Telephone Encounter (Signed)
Pt aware I will plan to follow up with him tomorrow to review procedure instructions

## 2019-09-10 NOTE — Telephone Encounter (Signed)
LMTCB at pt's wife  (DPR), phone number.

## 2019-09-10 NOTE — Telephone Encounter (Signed)
Spoke with patient, pre-procedure COVID test scheduled for today at 10:40 AM

## 2019-09-11 NOTE — Telephone Encounter (Addendum)
Pt contacted pre-catheterization scheduled at [facility] for: Friday September 12, 2019 11 AM Verified arrival time and place: [facility]) at: 5:30 AM pre-procedure hydration   No solid food after midnight prior to cath, clear liquids until 5 AM day of procedure.  Hold: Coumadin-none 09/08/19 until post procedure. Lasix-day before and day of procedure  Except hold medications AM meds can be  taken pre-cath with sips of water including: ASA 81 mg   Confirmed patient has responsible adult to drive home post procedure and observe 24 hours after arriving home: yes  You are allowed ONE visitor in the waiting room during your procedure. Both you and your visitor must wear a mask once you enter the hospital.       COVID-19 Pre-Screening Questions:  . In the past 14 days have you had a new cough associated with shortness of breath, unexplained body aches, fever (100.4 or greater) or sudden loss of taste or sense of smell? no . In the past 14 days have you been around anyone with known Covid 19? no . Any international travel in the past 14 days? no . Have you been vaccinated for COVID-19? Yes, see immunization history                         Reviewed procedure/mask/visitor instructions, COVID-19 screening questions with patient's wife (DPR).    Pt states he has a hearing impairment and is concerned about hearing and understanding what he is being told and  instructions given to him. Pt requests that his wife be allowed to be with him and assist him as much as possible during his time at the hospital for procedure.              Pt aware I will forward this information to Anderson Malta, RN, [employer].

## 2019-09-12 ENCOUNTER — Ambulatory Visit (HOSPITAL_COMMUNITY)
Admission: RE | Admit: 2019-09-12 | Discharge: 2019-09-12 | Disposition: A | Discharge status: Home / Self Care | Payer: Medicare Other | Attending: Cardiovascular Disease | Admitting: Cardiovascular Disease

## 2019-09-12 ENCOUNTER — Encounter (HOSPITAL_COMMUNITY)
Admission: RE | Disposition: A | Discharge status: Home / Self Care | Payer: Medicare Other | Source: Home / Self Care | Attending: Cardiovascular Disease

## 2019-09-12 ENCOUNTER — Other Ambulatory Visit: Payer: Self-pay

## 2019-09-12 DIAGNOSIS — I350 Nonrheumatic aortic (valve) stenosis: Secondary | ICD-10-CM | POA: Diagnosis not present

## 2019-09-12 DIAGNOSIS — M1990 Unspecified osteoarthritis, unspecified site: Secondary | ICD-10-CM | POA: Diagnosis not present

## 2019-09-12 DIAGNOSIS — G4733 Obstructive sleep apnea (adult) (pediatric): Secondary | ICD-10-CM | POA: Insufficient documentation

## 2019-09-12 DIAGNOSIS — Z952 Presence of prosthetic heart valve: Secondary | ICD-10-CM | POA: Insufficient documentation

## 2019-09-12 DIAGNOSIS — I130 Hypertensive heart and chronic kidney disease with heart failure and stage 1 through stage 4 chronic kidney disease, or unspecified chronic kidney disease: Secondary | ICD-10-CM | POA: Diagnosis not present

## 2019-09-12 DIAGNOSIS — K219 Gastro-esophageal reflux disease without esophagitis: Secondary | ICD-10-CM | POA: Insufficient documentation

## 2019-09-12 DIAGNOSIS — I25119 Atherosclerotic heart disease of native coronary artery with unspecified angina pectoris: Secondary | ICD-10-CM | POA: Diagnosis not present

## 2019-09-12 DIAGNOSIS — I209 Angina pectoris, unspecified: Secondary | ICD-10-CM | POA: Diagnosis present

## 2019-09-12 DIAGNOSIS — J4540 Moderate persistent asthma, uncomplicated: Secondary | ICD-10-CM | POA: Diagnosis not present

## 2019-09-12 DIAGNOSIS — E669 Obesity, unspecified: Secondary | ICD-10-CM | POA: Insufficient documentation

## 2019-09-12 DIAGNOSIS — I5042 Chronic combined systolic (congestive) and diastolic (congestive) heart failure: Secondary | ICD-10-CM | POA: Diagnosis not present

## 2019-09-12 DIAGNOSIS — Z9079 Acquired absence of other genital organ(s): Secondary | ICD-10-CM | POA: Insufficient documentation

## 2019-09-12 DIAGNOSIS — Z87891 Personal history of nicotine dependence: Secondary | ICD-10-CM | POA: Diagnosis not present

## 2019-09-12 DIAGNOSIS — Z86711 Personal history of pulmonary embolism: Secondary | ICD-10-CM | POA: Diagnosis not present

## 2019-09-12 DIAGNOSIS — Z950 Presence of cardiac pacemaker: Secondary | ICD-10-CM | POA: Insufficient documentation

## 2019-09-12 DIAGNOSIS — N183 Chronic kidney disease, stage 3 unspecified: Secondary | ICD-10-CM | POA: Insufficient documentation

## 2019-09-12 DIAGNOSIS — Z79899 Other long term (current) drug therapy: Secondary | ICD-10-CM | POA: Diagnosis not present

## 2019-09-12 DIAGNOSIS — E782 Mixed hyperlipidemia: Secondary | ICD-10-CM | POA: Diagnosis not present

## 2019-09-12 DIAGNOSIS — Z8546 Personal history of malignant neoplasm of prostate: Secondary | ICD-10-CM | POA: Insufficient documentation

## 2019-09-12 DIAGNOSIS — Z86718 Personal history of other venous thrombosis and embolism: Secondary | ICD-10-CM | POA: Diagnosis not present

## 2019-09-12 DIAGNOSIS — Z6831 Body mass index (BMI) 31.0-31.9, adult: Secondary | ICD-10-CM | POA: Diagnosis not present

## 2019-09-12 DIAGNOSIS — I2584 Coronary atherosclerosis due to calcified coronary lesion: Secondary | ICD-10-CM | POA: Insufficient documentation

## 2019-09-12 DIAGNOSIS — Z955 Presence of coronary angioplasty implant and graft: Secondary | ICD-10-CM | POA: Insufficient documentation

## 2019-09-12 DIAGNOSIS — Z7901 Long term (current) use of anticoagulants: Secondary | ICD-10-CM | POA: Diagnosis not present

## 2019-09-12 DIAGNOSIS — I4819 Other persistent atrial fibrillation: Secondary | ICD-10-CM | POA: Diagnosis not present

## 2019-09-12 DIAGNOSIS — I2510 Atherosclerotic heart disease of native coronary artery without angina pectoris: Secondary | ICD-10-CM

## 2019-09-12 DIAGNOSIS — Z96653 Presence of artificial knee joint, bilateral: Secondary | ICD-10-CM | POA: Insufficient documentation

## 2019-09-12 HISTORY — PX: LEFT HEART CATH AND CORONARY ANGIOGRAPHY: CATH118249

## 2019-09-12 LAB — PROTIME-INR
INR: 1.2 (ref 0.8–1.2)
Prothrombin Time: 14.7 seconds (ref 11.4–15.2)

## 2019-09-12 SURGERY — LEFT HEART CATH AND CORONARY ANGIOGRAPHY
Anesthesia: LOCAL

## 2019-09-12 MED ORDER — HEPARIN (PORCINE) IN NACL 1000-0.9 UT/500ML-% IV SOLN
INTRAVENOUS | Status: AC
  Filled 2019-09-12: qty 1000

## 2019-09-12 MED ORDER — HEPARIN SODIUM (PORCINE) 1000 UNIT/ML IJ SOLN
INTRAMUSCULAR | Status: AC
  Filled 2019-09-12: qty 1

## 2019-09-12 MED ORDER — LIDOCAINE HCL (PF) 1 % IJ SOLN
INTRAMUSCULAR | Status: AC
  Filled 2019-09-12: qty 30

## 2019-09-12 MED ORDER — SODIUM CHLORIDE 0.9% FLUSH: 3.0000 mL | Freq: Two times a day (BID) | INTRAVENOUS | Status: DC

## 2019-09-12 MED ORDER — SODIUM CHLORIDE 0.9 % WEIGHT BASED INFUSION: 1.0000 mL/kg/h | INTRAVENOUS | Status: DC

## 2019-09-12 MED ORDER — VERAPAMIL HCL 2.5 MG/ML IV SOLN
INTRAVENOUS | Status: DC | PRN
  Administered 2019-09-12: 10 mL via INTRA_ARTERIAL

## 2019-09-12 MED ORDER — ACETAMINOPHEN 325 MG PO TABS: 650.0000 mg | ORAL_TABLET | ORAL | Status: DC | PRN

## 2019-09-12 MED ORDER — SODIUM CHLORIDE 0.9 % IV SOLN: 250.0000 mL | INTRAVENOUS | Status: DC | PRN

## 2019-09-12 MED ORDER — MIDAZOLAM HCL 2 MG/2ML IJ SOLN
INTRAMUSCULAR | Status: DC | PRN
  Administered 2019-09-12: 1 mg via INTRAVENOUS

## 2019-09-12 MED ORDER — SODIUM CHLORIDE 0.9% FLUSH: 3.0000 mL | INTRAVENOUS | Status: DC | PRN

## 2019-09-12 MED ORDER — HEPARIN (PORCINE) IN NACL 1000-0.9 UT/500ML-% IV SOLN
INTRAVENOUS | Status: DC | PRN
  Administered 2019-09-12 (×2): 500 mL

## 2019-09-12 MED ORDER — VERAPAMIL HCL 2.5 MG/ML IV SOLN
INTRAVENOUS | Status: AC
  Filled 2019-09-12: qty 2

## 2019-09-12 MED ORDER — SODIUM CHLORIDE 0.9 % WEIGHT BASED INFUSION
3.0000 mL/kg/h | INTRAVENOUS | Status: AC
  Administered 2019-09-12: 3 mL/kg/h via INTRAVENOUS

## 2019-09-12 MED ORDER — ASPIRIN 81 MG PO CHEW: 81.0000 mg | CHEWABLE_TABLET | Freq: Once | ORAL | Status: DC

## 2019-09-12 MED ORDER — HYDRALAZINE HCL 20 MG/ML IJ SOLN: 10.0000 mg | INTRAMUSCULAR | Status: DC | PRN

## 2019-09-12 MED ORDER — LABETALOL HCL 5 MG/ML IV SOLN: 10.0000 mg | INTRAVENOUS | Status: DC | PRN

## 2019-09-12 MED ORDER — LIDOCAINE HCL (PF) 1 % IJ SOLN
INTRAMUSCULAR | Status: DC | PRN
  Administered 2019-09-12: 2 mL

## 2019-09-12 MED ORDER — HEPARIN SODIUM (PORCINE) 1000 UNIT/ML IJ SOLN
INTRAMUSCULAR | Status: DC | PRN
  Administered 2019-09-12: 5000 [IU] via INTRAVENOUS

## 2019-09-12 MED ORDER — FENTANYL CITRATE (PF) 100 MCG/2ML IJ SOLN
INTRAMUSCULAR | Status: AC
  Filled 2019-09-12: qty 2

## 2019-09-12 MED ORDER — ONDANSETRON HCL 4 MG/2ML IJ SOLN: 4.0000 mg | Freq: Four times a day (QID) | INTRAMUSCULAR | Status: DC | PRN

## 2019-09-12 MED ORDER — FENTANYL CITRATE (PF) 100 MCG/2ML IJ SOLN
INTRAMUSCULAR | Status: DC | PRN
  Administered 2019-09-12: 25 ug via INTRAVENOUS

## 2019-09-12 MED ORDER — MIDAZOLAM HCL 2 MG/2ML IJ SOLN
INTRAMUSCULAR | Status: AC
  Filled 2019-09-12: qty 2

## 2019-09-12 MED ORDER — IOHEXOL 350 MG/ML SOLN
INTRAVENOUS | Status: DC | PRN
  Administered 2019-09-12: 45 mL

## 2019-09-12 SURGICAL SUPPLY — 9 items

## 2019-09-12 NOTE — Interval H&P Note (Signed)
History and Physical Interval Note:  09/12/2019 11:10 AM  [name]  has presented today for surgery, with the diagnosis of cad - angina.  The various methods of treatment have been discussed with the patient and family. After consideration of risks, benefits and other options for treatment, the patient has consented to  Procedure(s): LEFT HEART CATH AND CORONARY ANGIOGRAPHY (N/A) as a surgical intervention.  The patient's history has been reviewed, patient examined, no change in status, stable for surgery.  I have reviewed the patient's chart and labs.  Questions were answered to the patient's satisfaction.    All data reviewed including most recent cath and PCI films.  Sherren Mocha

## 2019-09-12 NOTE — Discharge Instructions (Signed)
Radial Site Care  This sheet gives you information about how to care for yourself after your procedure. Your health care provider may also give you more specific instructions. If you have problems or questions, contact your health care provider. What can I expect after the procedure? After the procedure, it is common to have:  Bruising and tenderness at the catheter insertion area. Follow these instructions at home: Medicines  Take over-the-counter and prescription medicines only as told by your health care provider. Insertion site care  Follow instructions from your health care provider about how to take care of your insertion site. Make sure you: ? Wash your hands with soap and water before you change your bandage (dressing). If soap and water are not available, use hand sanitizer. ? Change your dressing as told by your health care provider. ? Leave stitches (sutures), skin glue, or adhesive strips in place. These skin closures may need to stay in place for 2 weeks or longer. If adhesive strip edges start to loosen and curl up, you may trim the loose edges. Do not remove adhesive strips completely unless your health care provider tells you to do that.  Check your insertion site every day for signs of infection. Check for: ? Redness, swelling, or pain. ? Fluid or blood. ? Pus or a bad smell. ? Warmth.  Do not take baths, swim, or use a hot tub until your health care provider approves.  You may shower 24-48 hours after the procedure, or as directed by your health care provider. ? Remove the dressing and gently wash the site with plain soap and water. ? Pat the area dry with a clean towel. ? Do not rub the site. That could cause bleeding.  Do not apply powder or lotion to the site. Activity   For 24 hours after the procedure, or as directed by your health care provider: ? Do not flex or bend the affected arm. ? Do not push or pull heavy objects with the affected arm. ? Do not  drive yourself home from the hospital or clinic. You may drive 24 hours after the procedure unless your health care provider tells you not to. ? Do not operate machinery or power tools.  Do not lift anything that is heavier than 10 lb (4.5 kg), or the limit that you are told, until your health care provider says that it is safe.  Ask your health care provider when it is okay to: ? Return to work or school. ? Resume usual physical activities or sports. ? Resume sexual activity. General instructions  If the catheter site starts to bleed, raise your arm and put firm pressure on the site. If the bleeding does not stop, get help right away. This is a medical emergency.  If you went home on the same day as your procedure, a responsible adult should be with you for the first 24 hours after you arrive home.  Keep all follow-up visits as told by your health care provider. This is important. Contact a health care provider if:  You have a fever.  You have redness, swelling, or yellow drainage around your insertion site. Get help right away if:  You have unusual pain at the radial site.  The catheter insertion area swells very fast.  The insertion area is bleeding, and the bleeding does not stop when you hold steady pressure on the area.  Your arm or hand becomes pale, cool, tingly, or numb. These symptoms may represent a serious problem   that is an emergency. Do not wait to see if the symptoms will go away. Get medical help right away. Call your local emergency services (911 in the [place] Do not drive yourself to the hospital. Summary  After the procedure, it is common to have bruising and tenderness at the site.  Follow instructions from your health care provider about how to take care of your radial site wound. Check the wound every day for signs of infection.  Do not lift anything that is heavier than 10 lb (4.5 kg), or the limit that you are told, until your health care provider says  that it is safe. This information is not intended to replace advice given to you by your health care provider. Make sure you discuss any questions you have with your health care provider. Document Revised: 02/28/2017 Document Reviewed: 02/28/2017 Elsevier Patient Education  2020 Elsevier Inc.  

## 2019-09-15 ENCOUNTER — Telehealth: Payer: Self-pay | Admitting: Emergency Medicine

## 2019-09-15 ENCOUNTER — Encounter (HOSPITAL_COMMUNITY): Payer: Self-pay | Admitting: Cardiovascular Disease

## 2019-09-15 NOTE — Telephone Encounter (Signed)
Patient recently had cath and was recently put on imdur. He is not sleeping well after being put on this medication and wants to know if he can stop it until his upcoming appointment with Dr. Bettina Gavia on 09/19/19.

## 2019-09-15 NOTE — Telephone Encounter (Signed)
Yes he can stop this medication.

## 2019-09-15 NOTE — Telephone Encounter (Signed)
Left message for patient to return call.

## 2019-09-16 ENCOUNTER — Ambulatory Visit (INDEPENDENT_AMBULATORY_CARE_PROVIDER_SITE_OTHER): Payer: Medicare Other | Admitting: *Deleted

## 2019-09-16 DIAGNOSIS — I441 Atrioventricular block, second degree: Secondary | ICD-10-CM | POA: Diagnosis not present

## 2019-09-16 LAB — CUP PACEART REMOTE DEVICE CHECK
Battery Remaining Longevity: 65 mo
Battery Remaining Percentage: 95.5 %
Battery Voltage: 2.96 V
Brady Statistic AP VP Percent: 0 %
Brady Statistic AP VS Percent: 0 %
Brady Statistic AS VP Percent: 0 %
Brady Statistic AS VS Percent: 0 %
Brady Statistic RA Percent Paced: 0 %
Date Time Interrogation Session: 20210810020034
Implantable Lead Implant Date: 20190206
Implantable Lead Implant Date: 20190206
Implantable Lead Implant Date: 20190206
Implantable Lead Location: 753858
Implantable Lead Location: 753859
Implantable Lead Location: 753860
Implantable Pulse Generator Implant Date: 20190206
Lead Channel Impedance Value: 430 Ohm
Lead Channel Impedance Value: 460 Ohm
Lead Channel Impedance Value: 640 Ohm
Lead Channel Pacing Threshold Amplitude: 0.375 V
Lead Channel Pacing Threshold Amplitude: 0.75 V
Lead Channel Pacing Threshold Amplitude: 2.5 V
Lead Channel Pacing Threshold Pulse Width: 0.4 ms
Lead Channel Pacing Threshold Pulse Width: 0.5 ms
Lead Channel Pacing Threshold Pulse Width: 0.8 ms
Lead Channel Sensing Intrinsic Amplitude: 11.4 mV
Lead Channel Sensing Intrinsic Amplitude: 2.7 mV
Lead Channel Setting Pacing Amplitude: 1.375
Lead Channel Setting Pacing Amplitude: 2 V
Lead Channel Setting Pacing Amplitude: 3.25 V
Lead Channel Setting Pacing Pulse Width: 0.4 ms
Lead Channel Setting Pacing Pulse Width: 0.8 ms
Lead Channel Setting Sensing Sensitivity: 2 mV
Pulse Gen Model: 3262
Pulse Gen Serial Number: 8995812

## 2019-09-17 NOTE — Progress Notes (Signed)
Remote pacemaker transmission.   

## 2019-09-17 NOTE — Telephone Encounter (Signed)
Left message for patient to return call.

## 2019-09-18 NOTE — Progress Notes (Signed)
Cardiology Office Note:    Date:  09/19/2019   ID:  [name], DOB 22-Jun-1931, MRN [id number]  PCP:  Shirline Frees, MD  Cardiologist:  Shirlee More, MD    Referring MD: Shirline Frees, MD    ASSESSMENT:    1. Coronary artery disease involving native coronary artery of native heart, angina presence unspecified   2. Persistent atrial fibrillation (HCC)   3. Heart block AV second degree   4. Biventricular cardiac pacemaker in situ   5. Hypertensive heart disease with chronic combined systolic and diastolic congestive heart failure ([place])   6. Mixed hyperlipidemia    PLAN:    In order of problems listed above:  1. He is improved reassured with his recent coronary angiography having no angina patent stents and requires no further revascularization.  New York Heart Association class I continue medical therapy including long-term anticoagulation of warfarin oral nitrate long-acting calcium channel blocker and a statin. 2. Stable rates controlled continue anticoagulation 3. Normal pacemaker function followed in our device clinic 4. BP at target continue current treatment heart failure compensated continue his current loop diuretic 5. Lipids are ideal continue pravastatin   Next appointment: 6 months   Medication Adjustments/Labs and Tests Ordered: Current medicines are reviewed at length with the patient today.  Concerns regarding medicines are outlined above.  No orders of the defined types were placed in this encounter.  Meds ordered this encounter  Medications   isosorbide mononitrate (IMDUR) 30 MG 24 hr tablet    Sig: Take 0.5 tablets (15 mg total) by mouth daily.    Dispense:  45 tablet    Refill:  3    Chief Complaint  Patient presents with   Follow-up   Coronary Artery Disease    After recent coronary angiography which shows patent stents and no need for further revascularization    History of Present Illness:    [name] is a 86 y.o. male  with a hx of CAD s/p PCI with DES to RCA 03/18/18, severe AS s/p TAVR 03/19/18, persistent atrial fibrillation anticoagulated on Warfarin, recurrent DVT/PE s/p IVC filter, HTN, combined heart failure, and a permanent pacemaker last seen 08/27/2019.  His last device download 09/16/2019 personally reviewed shows normal pacemaker parameters and function, he is ventricularly paced Compliance with diet, lifestyle and medications: Yes  I reviewed his coronary angiogram with him he is quite reassured has had no further angina and after discussion we will continue his low-dose oral mononitrate to next visit.  No shortness of breath chest pain palpitation or syncope.  He tolerates his anticoagulant without bleeding complication  Left heart cath 09/11/2019: Coronary Diagrams  Diagnostic Dominance: Right   Conclusion  1. Status post TAVR with no significant transaortic valve gradient measured by pullback 2. Status post RCA stenting with continued patency of the stents in the mid vessel, mild in-stent restenosis, and moderate distal vessel disease unchanged from the previous study 3. Status post stenting of the left circumflex with continued stent patency and mild in-stent restenosis, severe subbranch stenosis in the distal OM appropriate for medical therapy as this is a medium caliber distal branch vessel with stable disease compared with previous cath 4. Mild nonobstructive LAD stenosis with no flow-limiting lesions identified  Past Medical History:  Diagnosis Date   Aortic valve stenosis 04/05/2015   Formatting of this note might be different from the original. Overview:  ECHO 5/15 Formatting of this note might be different from the original. Overview:  Overview:  ECHO 5/15   Arthritis    "knees, right shoulder" (03/14/2017)   Atrial fibrillation, persistent ([place]) 09/19/2014   CAD (coronary artery disease), native coronary artery    Cath 2011 [facility] (08/2009):~ Proximal LAD 30%, mid to distal LAD 25%,  ostial small D1 75% mid AV groove circumflex 99% been subtotal stenosis, proximal to mid RCA 25-30%, mid RCA 30%, mid PDA 30%, EF 50% with inferior hypokinesis.;    July 2011  PCI and DES to circumflex Dr. Olevia Perches   ETT-Myoview (06/2013):  Inferolateral scar, mild peri-infarct ischemia, EF 40%; Intermediate Risk   ECHO EF 55% 03/2013    CAD (coronary artery disease), native coronary artery 04/05/2015   Cath 2011 [facility] (08/2009):~ Proximal LAD 30%, mid to distal LAD 25%, ostial small D1 75% mid AV groove circumflex 99% been subtotal stenosis, proximal to mid RCA 25-30%, mid RCA 30%, mid PDA 30%, EF 50% with inferior hypokinesis.;    July 2011  PCI and DES to circumflex Dr. Olevia Perches   ETT-Myoview (06/2013):  Inferolateral scar, mild peri-infarct ischemia, EF 40%; Intermediate Risk   ECHO EF 55% 03/2013     Cardiac pacemaker in situ 03/14/2017   Biventricular St. Jude inserted 03/14/17 Dr. Lovena Le for second degree heart block    Chronic combined systolic and diastolic heart failure ([place]) 11/19/2017   CKD (chronic kidney disease), stage III    Gastro-esophageal reflux disease without esophagitis 09/24/2009   GERD    History of infection of prosthetic knee 04/05/2015   Treated with debridement and irrigation followed by 6 months of triple antibiotics   History of kidney stones    History of peptic ulcer 1970s?   History of prostate cancer    Radical prostatectomy in 2006 Dr. Terance Hart    Hypertensive heart disease    Internal hemorrhoids 09/11/2018   Long term (current) use of anticoagulants 10/06/2011   Mixed hyperlipidemia    Moderate persistent asthma without complication 05/30/5359   Noise effect on both inner ears 08/14/2018   Obesity (BMI 30-39.9)    OSA on CPAP    OSA treated with BiPAP 11/15/2015   Persistent atrial fibrillation ([place]) 09/19/2014   CHA2DS2VASC score 5  Cardioversion 10/08/2014 was on amiodarone until 2018    Personal history of DVT and pulmonary embolism  (deep vein  thrombosis)    Initial DVT in 2006 after prostate surgery and had [place] filter placed Bilateral  PE 2013 and placed back on warfarin DVT of right subclavian vein on doppler 10/2012 at time of knee infection    Personal history of malignant neoplasm of prostate    Radical prostatectomy in 2006 Dr. Terance Hart     Presbycusis of both ears 08/14/2018   Severe aortic stenosis    Severe aortic stenosis 03/18/2018   Urinary incontinence    MULTIPLE BLADDER SURGERIES - STATES NO URINARY SPHINCTER - PT'S UROLOGIST IS AT [facility]- DR. PETERSON  ( LAST VISIT WAS 09/15/11 )    Past Surgical History:  Procedure Laterality Date   APPENDECTOMY     BI-VENTRICULAR PACEMAKER INSERTION (CRT-P)  03/14/2017   BIV PACEMAKER INSERTION CRT-P N/A 03/14/2017   Procedure: BIV PACEMAKER INSERTION CRT-P;  Surgeon: Evans Lance, MD;  Location: [place] [facility];  Service: Cardiovascular;  Laterality: N/A;   CARDIOVERSION N/A 10/08/2014   Procedure: CARDIOVERSION;  Surgeon: Jacolyn Reedy, MD;  Location: [place];  Service: Cardiovascular;  Laterality: N/A;   CATARACT EXTRACTION W/ INTRAOCULAR LENS  IMPLANT, BILATERAL Bilateral    CORONARY ANGIOPLASTY  WITH STENT PLACEMENT  08/25/2009   DES-mid LCx 08/2009; 30% pLAD, 25% m/dLAD, 75% ostial D1, 99% mLCx s/p DES, 25-30% p/mRCA, 40% mRCA, 30% mPDA stenoses; LVEF 50%, inf hypokinesis   CORONARY STENT INTERVENTION N/A 03/18/2018   Procedure: CORONARY STENT INTERVENTION;  Surgeon: Sherren Mocha, MD;  Location: [place] [facility];  Service: Cardiovascular;  Laterality: N/A;   EXCISIONAL HEMORRHOIDECTOMY     I & D KNEE WITH POLY EXCHANGE Left 10/09/2012   Procedure: IRRIGATION AND DEBRIDEMENT LEFT KNEE WITH POLY REVISION;  Surgeon: Gearlean Alf, MD;  Location: [facility];  Service: Orthopedics;  Laterality: Left;   INGUINAL HERNIA REPAIR Right    INTRAOPERATIVE TRANSTHORACIC ECHOCARDIOGRAM  03/19/2018   Procedure: Intraoperative Transthoracic Echocardiogram;   Surgeon: Sherren Mocha, MD;  Location: [place];  Service: Open Heart Surgery;;   JOINT REPLACEMENT     KNEE ARTHROTOMY Left 10/09/2012   Procedure: LEFT KNEE ARTHROTOMY;  Surgeon: Gearlean Alf, MD;  Location: [facility];  Service: Orthopedics;  Laterality: Left;   LEFT HEART CATH AND CORONARY ANGIOGRAPHY N/A 02/28/2018   Procedure: LEFT HEART CATH AND CORONARY ANGIOGRAPHY;  Surgeon: Sherren Mocha, MD;  Location: [place] [facility];  Service: Cardiovascular;  Laterality: N/A;   LEFT HEART CATH AND CORONARY ANGIOGRAPHY N/A 09/12/2019   Procedure: LEFT HEART CATH AND CORONARY ANGIOGRAPHY;  Surgeon: Sherren Mocha, MD;  Location: [place] [facility];  Service: Cardiovascular;  Laterality: N/A;   PROSTATECTOMY  04/25/2004   REPLACEMENT TOTAL KNEE Bilateral    SKIN BIOPSY     "off nose; wasn't cancer; it was tested" (03/14/2017)   TRANSCATHETER AORTIC VALVE REPLACEMENT, TRANSFEMORAL N/A 03/19/2018   Procedure: TRANSCATHETER AORTIC VALVE REPLACEMENT, TRANSFEMORAL;  Surgeon: Sherren Mocha, MD;  Location: [place];  Service: Open Heart Surgery;  Laterality: N/A;   Uretheral implants     multiple for incontinence   VENA CAVA FILTER PLACEMENT      Current Medications: Current Meds  Medication Sig   acetaminophen (TYLENOL) 500 MG tablet Take 500 mg by mouth at bedtime.    amLODipine (NORVASC) 2.5 MG tablet Take 1 tablet (2.5 mg total) by mouth daily.   Ascorbic Acid (VITAMIN C) 1000 MG tablet Take 1,000 mg by mouth daily.   Biotin 2500 MCG CAPS Take 2,500 mcg by mouth daily.   cholecalciferol (VITAMIN D) 1000 UNITS tablet Take 1,000 Units by mouth daily.   docusate sodium (COLACE) 50 MG capsule Take 50 mg by mouth daily.   furosemide (LASIX) 20 MG tablet Take 20 mg by mouth daily.    Glucosamine HCl (GLUCOSAMINE PO) Take 2,000 mg by mouth daily.   isosorbide mononitrate (IMDUR) 30 MG 24 hr tablet Take 0.5 tablets (15 mg total) by mouth daily.   Multiple Vitamin (MULTIVITAMIN) capsule  Take 1 capsule by mouth daily.    nitroGLYCERIN (NITROSTAT) 0.4 MG SL tablet Place 1 tablet (0.4 mg total) under the tongue every 5 (five) minutes as needed.   Omega-3 Fatty Acids (FISH OIL) 1000 MG CAPS Take 1,000 mg by mouth daily.   pantoprazole (PROTONIX) 40 MG tablet Take 40 mg by mouth daily.   pravastatin (PRAVACHOL) 20 MG tablet TAKE 1 TABLET BY MOUTH EVERY DAY   vitamin B-12 (CYANOCOBALAMIN) 500 MCG tablet Take 500 mcg by mouth daily.   warfarin (COUMADIN) 2.5 MG tablet TAKE 1 TABLET BY MOUTH EVERY DAY   [DISCONTINUED] isosorbide mononitrate (IMDUR) 30 MG 24 hr tablet Take 0.5 tablets (15 mg total) by mouth daily.  Allergies:   Patient has no known allergies.   Social History   Socioeconomic History   Marital status: Married    Spouse name: Not on file   Number of children: 3   Years of education: Not on file   Highest education level: Not on file  Occupational History   Occupation: Retired Therapist, nutritional  Tobacco Use   Smoking status: Former Smoker    Packs/day: 1.00    Years: 10.00    Pack years: 10.00    Types: Cigarettes    Quit date: 04/27/1961    Years since quitting: 58.4   Smokeless tobacco: Never Used  Vaping Use   Vaping Use: Never used  Substance and Sexual Activity   Alcohol use: No    Alcohol/week: 0.0 standard drinks   Drug use: No   Sexual activity: Not on file  Other Topics Concern   Not on file  Social History Narrative   Not on file   Social Determinants of Health   Financial Resource Strain:    Difficulty of Paying Living Expenses:   Food Insecurity:    Worried About Charity fundraiser in the Last Year:    Arboriculturist in the Last Year:   Transportation Needs:    Film/video editor (Medical):    Lack of Transportation (Non-Medical):   Physical Activity:    Days of Exercise per Week:    Minutes of Exercise per Session:   Stress:    Feeling of Stress :   Social Connections:    Frequency of  Communication with Friends and Family:    Frequency of Social Gatherings with Friends and Family:    Attends Religious Services:    Active Member of Clubs or Organizations:    Attends Archivist Meetings:    Marital Status:      Family History: The patient's family history includes Melanoma in his brother and father. ROS:   Please see the history of present illness.    All other systems reviewed and are negative.  EKGs/Labs/Other Studies Reviewed:    The following studies were reviewed today:   Recent Labs: 02/13/2019: NT-Pro BNP 1,252 04/16/2019: Magnesium 1.9 07/14/2019: ALT 5 09/08/2019: BUN 22; Creatinine, Ser 1.42; Hemoglobin 16.2; Platelets 166; Potassium 4.1; Sodium 141  Recent Lipid Panel    Component Value Date/Time   CHOL 118 07/14/2019 1343   TRIG 141 07/14/2019 1343   HDL 32 (L) 07/14/2019 1343   CHOLHDL 3.7 07/14/2019 1343   CHOLHDL 3 04/28/2011 1003   VLDL 17.0 04/28/2011 1003   LDLCALC 61 07/14/2019 1343    Physical Exam:    VS:  BP 140/80 (BP Location: Left Arm, Patient Position: Sitting, Cuff Size: Normal)    Pulse 62    Ht 6' (1.829 m)    Wt 235 lb (106.6 kg)    SpO2 95%    BMI 31.87 kg/m     Wt Readings from Last 3 Encounters:  09/19/19 235 lb (106.6 kg)  09/12/19 236 lb (107 kg)  09/03/19 (!) 235 lb (106.6 kg)     GEN:  Well nourished, well developed in no acute distress HEENT: Normal NECK: No JVD; No carotid bruits LYMPHATICS: No lymphadenopathy CARDIAC: RRR, no murmurs, rubs, gallops RESPIRATORY:  Clear to auscultation without rales, wheezing or rhonchi  ABDOMEN: Soft, non-tender, non-distended MUSCULOSKELETAL:  No edema; No deformity  SKIN: Warm and dry NEUROLOGIC:  Alert and oriented x 3 PSYCHIATRIC:  Normal affect    Signed,  Shirlee More, MD  09/19/2019 3:25 PM    [place]

## 2019-09-19 ENCOUNTER — Ambulatory Visit (INDEPENDENT_AMBULATORY_CARE_PROVIDER_SITE_OTHER): Payer: Medicare Other | Admitting: Cardiology

## 2019-09-19 ENCOUNTER — Encounter: Payer: Self-pay | Admitting: Cardiology

## 2019-09-19 ENCOUNTER — Other Ambulatory Visit: Payer: Self-pay

## 2019-09-19 VITALS — BP 140/80 | HR 62 | Ht 72.0 in | Wt 235.0 lb

## 2019-09-19 DIAGNOSIS — I4819 Other persistent atrial fibrillation: Secondary | ICD-10-CM | POA: Diagnosis not present

## 2019-09-19 DIAGNOSIS — I441 Atrioventricular block, second degree: Secondary | ICD-10-CM

## 2019-09-19 DIAGNOSIS — I5042 Chronic combined systolic (congestive) and diastolic (congestive) heart failure: Secondary | ICD-10-CM | POA: Diagnosis not present

## 2019-09-19 DIAGNOSIS — E782 Mixed hyperlipidemia: Secondary | ICD-10-CM | POA: Diagnosis not present

## 2019-09-19 DIAGNOSIS — Z950 Presence of cardiac pacemaker: Secondary | ICD-10-CM | POA: Diagnosis not present

## 2019-09-19 DIAGNOSIS — I2510 Atherosclerotic heart disease of native coronary artery without angina pectoris: Secondary | ICD-10-CM

## 2019-09-19 DIAGNOSIS — I110 Hypertensive heart disease with heart failure: Secondary | ICD-10-CM

## 2019-09-19 MED ORDER — ISOSORBIDE MONONITRATE ER 30 MG PO TB24: 15.0000 mg | ORAL_TABLET | Freq: Every day | ORAL | 3 refills | Status: DC

## 2019-09-19 NOTE — Patient Instructions (Addendum)
Medication Instructions:  Your physician recommends that you continue on your current medications as directed. Please refer to the Current Medication list given to you today.  *If you need a refill on your cardiac medications before your next appointment, please call your pharmacy*   Lab Work: None If you have labs (blood work) drawn today and your tests are completely normal, you will receive your results only by: Marland Kitchen MyChart Message (if you have MyChart) OR . A paper copy in the mail If you have any lab test that is abnormal or we need to change your treatment, we will call you to review the results.   Testing/Procedures: None   Follow-Up: At [facility], you and your health needs are our priority.  As part of our continuing mission to provide you with exceptional heart care, we have created designated Provider Care Teams.  These Care Teams include your primary Cardiologist (physician) and Advanced Practice Providers (APPs -  Physician Assistants and Nurse Practitioners) who all work together to provide you with the care you need, when you need it.  We recommend signing up for the patient portal called "MyChart".  Sign up information is provided on this After Visit Summary.  MyChart is used to connect with patients for Virtual Visits (Telemedicine).  Patients are able to view lab/test results, encounter notes, upcoming appointments, etc.  Non-urgent messages can be sent to your provider as well.   To learn more about what you can do with MyChart, go to [email or web address].    Your next appointment:   3 month(s)  The format for your next appointment:   In Person  Provider:   Shirlee More, MD or Dr. Harriet Masson   Other Instructions

## 2019-09-22 ENCOUNTER — Other Ambulatory Visit: Payer: Self-pay

## 2019-09-22 ENCOUNTER — Ambulatory Visit (INDEPENDENT_AMBULATORY_CARE_PROVIDER_SITE_OTHER): Payer: Medicare Other | Admitting: Pharmacist

## 2019-09-22 DIAGNOSIS — Z86718 Personal history of other venous thrombosis and embolism: Secondary | ICD-10-CM | POA: Diagnosis not present

## 2019-09-22 DIAGNOSIS — Z7901 Long term (current) use of anticoagulants: Secondary | ICD-10-CM | POA: Diagnosis not present

## 2019-09-22 DIAGNOSIS — I4819 Other persistent atrial fibrillation: Secondary | ICD-10-CM

## 2019-09-22 LAB — POCT INR: INR: 1.5 — AB (ref 2.0–3.0)

## 2019-09-22 NOTE — Patient Instructions (Signed)
Description    Take 1 whole tablet tonight and take 1.5 tablets tomorrow, then resume normal schedule of 1 tablet every day except /2 tablet Mondays and Fridays Recheck INR 1 week post procedure.  Call us with any medication changes or concerns. [facility] [phone] office.

## 2019-09-23 DIAGNOSIS — I4891 Unspecified atrial fibrillation: Secondary | ICD-10-CM | POA: Diagnosis not present

## 2019-09-23 DIAGNOSIS — I2510 Atherosclerotic heart disease of native coronary artery without angina pectoris: Secondary | ICD-10-CM | POA: Diagnosis not present

## 2019-09-23 DIAGNOSIS — J4540 Moderate persistent asthma, uncomplicated: Secondary | ICD-10-CM | POA: Diagnosis not present

## 2019-09-23 DIAGNOSIS — G4733 Obstructive sleep apnea (adult) (pediatric): Secondary | ICD-10-CM | POA: Diagnosis not present

## 2019-09-29 ENCOUNTER — Ambulatory Visit (INDEPENDENT_AMBULATORY_CARE_PROVIDER_SITE_OTHER): Payer: Medicare Other | Admitting: *Deleted

## 2019-09-29 ENCOUNTER — Other Ambulatory Visit: Payer: Self-pay

## 2019-09-29 DIAGNOSIS — Z86718 Personal history of other venous thrombosis and embolism: Secondary | ICD-10-CM | POA: Diagnosis not present

## 2019-09-29 DIAGNOSIS — I4819 Other persistent atrial fibrillation: Secondary | ICD-10-CM | POA: Diagnosis not present

## 2019-09-29 DIAGNOSIS — Z5181 Encounter for therapeutic drug level monitoring: Secondary | ICD-10-CM | POA: Diagnosis not present

## 2019-09-29 LAB — POCT INR: INR: 1.8 — AB (ref 2.0–3.0)

## 2019-09-29 NOTE — Patient Instructions (Signed)
Description   Today take 1 whole tablet then start taking 1 tablet every day except 1/2 tablet Fridays.  Recheck INR in 2 weeks.  Call us with any medication changes or concerns. [facility] [phone] office.

## 2019-09-30 DIAGNOSIS — H903 Sensorineural hearing loss, bilateral: Secondary | ICD-10-CM | POA: Diagnosis not present

## 2019-10-01 NOTE — Telephone Encounter (Signed)
Called patient, he has already got this resolved, no further questions.

## 2019-10-07 DIAGNOSIS — I2510 Atherosclerotic heart disease of native coronary artery without angina pectoris: Secondary | ICD-10-CM | POA: Diagnosis not present

## 2019-10-07 DIAGNOSIS — N183 Chronic kidney disease, stage 3 unspecified: Secondary | ICD-10-CM | POA: Diagnosis not present

## 2019-10-07 DIAGNOSIS — M545 Low back pain: Secondary | ICD-10-CM | POA: Diagnosis not present

## 2019-10-07 DIAGNOSIS — N3001 Acute cystitis with hematuria: Secondary | ICD-10-CM | POA: Diagnosis not present

## 2019-10-07 DIAGNOSIS — G4730 Sleep apnea, unspecified: Secondary | ICD-10-CM | POA: Diagnosis not present

## 2019-10-07 DIAGNOSIS — Z0000 Encounter for general adult medical examination without abnormal findings: Secondary | ICD-10-CM | POA: Diagnosis not present

## 2019-10-07 DIAGNOSIS — I4891 Unspecified atrial fibrillation: Secondary | ICD-10-CM | POA: Diagnosis not present

## 2019-10-07 DIAGNOSIS — I10 Essential (primary) hypertension: Secondary | ICD-10-CM | POA: Diagnosis not present

## 2019-10-07 DIAGNOSIS — E7800 Pure hypercholesterolemia, unspecified: Secondary | ICD-10-CM | POA: Diagnosis not present

## 2019-10-07 DIAGNOSIS — Z8546 Personal history of malignant neoplasm of prostate: Secondary | ICD-10-CM | POA: Diagnosis not present

## 2019-10-07 DIAGNOSIS — Z7901 Long term (current) use of anticoagulants: Secondary | ICD-10-CM | POA: Diagnosis not present

## 2019-10-10 DIAGNOSIS — I10 Essential (primary) hypertension: Secondary | ICD-10-CM | POA: Diagnosis not present

## 2019-10-10 DIAGNOSIS — E038 Other specified hypothyroidism: Secondary | ICD-10-CM | POA: Diagnosis not present

## 2019-10-10 DIAGNOSIS — E7800 Pure hypercholesterolemia, unspecified: Secondary | ICD-10-CM | POA: Diagnosis not present

## 2019-10-10 DIAGNOSIS — N183 Chronic kidney disease, stage 3 unspecified: Secondary | ICD-10-CM | POA: Diagnosis not present

## 2019-10-10 DIAGNOSIS — Z8546 Personal history of malignant neoplasm of prostate: Secondary | ICD-10-CM | POA: Diagnosis not present

## 2019-10-10 DIAGNOSIS — I4891 Unspecified atrial fibrillation: Secondary | ICD-10-CM | POA: Diagnosis not present

## 2019-10-10 DIAGNOSIS — C61 Malignant neoplasm of prostate: Secondary | ICD-10-CM | POA: Diagnosis not present

## 2019-10-10 DIAGNOSIS — M1990 Unspecified osteoarthritis, unspecified site: Secondary | ICD-10-CM | POA: Diagnosis not present

## 2019-10-10 DIAGNOSIS — E785 Hyperlipidemia, unspecified: Secondary | ICD-10-CM | POA: Diagnosis not present

## 2019-10-10 DIAGNOSIS — I2510 Atherosclerotic heart disease of native coronary artery without angina pectoris: Secondary | ICD-10-CM | POA: Diagnosis not present

## 2019-10-14 ENCOUNTER — Ambulatory Visit (INDEPENDENT_AMBULATORY_CARE_PROVIDER_SITE_OTHER): Payer: Medicare Other | Admitting: *Deleted

## 2019-10-14 ENCOUNTER — Other Ambulatory Visit: Payer: Self-pay

## 2019-10-14 DIAGNOSIS — I4819 Other persistent atrial fibrillation: Secondary | ICD-10-CM

## 2019-10-14 DIAGNOSIS — Z5181 Encounter for therapeutic drug level monitoring: Secondary | ICD-10-CM

## 2019-10-14 DIAGNOSIS — Z86718 Personal history of other venous thrombosis and embolism: Secondary | ICD-10-CM

## 2019-10-14 LAB — POCT INR: INR: 1.7 — AB (ref 2.0–3.0)

## 2019-10-14 NOTE — Patient Instructions (Addendum)
Description   Today take 1.5 tablets then start taking 1 tablet everyday. Recheck INR in 2 weeks. Call us with any medication changes or concerns. [facility] [phone] office.

## 2019-10-16 ENCOUNTER — Ambulatory Visit
Admission: RE | Admit: 2019-10-16 | Discharge: 2019-10-16 | Disposition: A | Discharge status: Home / Self Care | Payer: Medicare Other | Source: Ambulatory Visit | Attending: Family Medicine | Admitting: Family Medicine

## 2019-10-16 ENCOUNTER — Other Ambulatory Visit: Payer: Self-pay

## 2019-10-16 ENCOUNTER — Other Ambulatory Visit: Payer: Self-pay | Admitting: Family Medicine

## 2019-10-16 DIAGNOSIS — N200 Calculus of kidney: Secondary | ICD-10-CM | POA: Diagnosis not present

## 2019-10-16 DIAGNOSIS — M545 Low back pain, unspecified: Secondary | ICD-10-CM

## 2019-10-16 DIAGNOSIS — I700 Atherosclerosis of aorta: Secondary | ICD-10-CM | POA: Diagnosis not present

## 2019-10-16 DIAGNOSIS — M47816 Spondylosis without myelopathy or radiculopathy, lumbar region: Secondary | ICD-10-CM | POA: Diagnosis not present

## 2019-10-28 ENCOUNTER — Other Ambulatory Visit: Payer: Self-pay

## 2019-10-28 ENCOUNTER — Ambulatory Visit (INDEPENDENT_AMBULATORY_CARE_PROVIDER_SITE_OTHER): Payer: Medicare Other | Admitting: *Deleted

## 2019-10-28 DIAGNOSIS — Z5181 Encounter for therapeutic drug level monitoring: Secondary | ICD-10-CM

## 2019-10-28 DIAGNOSIS — I4819 Other persistent atrial fibrillation: Secondary | ICD-10-CM | POA: Diagnosis not present

## 2019-10-28 DIAGNOSIS — Z86718 Personal history of other venous thrombosis and embolism: Secondary | ICD-10-CM

## 2019-10-28 LAB — POCT INR: INR: 2.9 (ref 2.0–3.0)

## 2019-10-28 NOTE — Patient Instructions (Signed)
Description   Continue taking Warfarin 1 tablet everyday. Recheck INR in 3 weeks. Call us with any medication changes or concerns. [facility] [phone] office.

## 2019-11-11 DIAGNOSIS — C61 Malignant neoplasm of prostate: Secondary | ICD-10-CM | POA: Diagnosis not present

## 2019-11-12 DIAGNOSIS — E785 Hyperlipidemia, unspecified: Secondary | ICD-10-CM | POA: Diagnosis not present

## 2019-11-12 DIAGNOSIS — I10 Essential (primary) hypertension: Secondary | ICD-10-CM | POA: Diagnosis not present

## 2019-11-12 DIAGNOSIS — Z8546 Personal history of malignant neoplasm of prostate: Secondary | ICD-10-CM | POA: Diagnosis not present

## 2019-11-12 DIAGNOSIS — M1990 Unspecified osteoarthritis, unspecified site: Secondary | ICD-10-CM | POA: Diagnosis not present

## 2019-11-12 DIAGNOSIS — C61 Malignant neoplasm of prostate: Secondary | ICD-10-CM | POA: Diagnosis not present

## 2019-11-12 DIAGNOSIS — I4891 Unspecified atrial fibrillation: Secondary | ICD-10-CM | POA: Diagnosis not present

## 2019-11-12 DIAGNOSIS — I2510 Atherosclerotic heart disease of native coronary artery without angina pectoris: Secondary | ICD-10-CM | POA: Diagnosis not present

## 2019-11-12 DIAGNOSIS — E7800 Pure hypercholesterolemia, unspecified: Secondary | ICD-10-CM | POA: Diagnosis not present

## 2019-11-12 DIAGNOSIS — E038 Other specified hypothyroidism: Secondary | ICD-10-CM | POA: Diagnosis not present

## 2019-11-12 DIAGNOSIS — N183 Chronic kidney disease, stage 3 unspecified: Secondary | ICD-10-CM | POA: Diagnosis not present

## 2019-11-18 ENCOUNTER — Ambulatory Visit (INDEPENDENT_AMBULATORY_CARE_PROVIDER_SITE_OTHER): Payer: Medicare Other

## 2019-11-18 ENCOUNTER — Other Ambulatory Visit: Payer: Self-pay

## 2019-11-18 DIAGNOSIS — Z86718 Personal history of other venous thrombosis and embolism: Secondary | ICD-10-CM

## 2019-11-18 DIAGNOSIS — I4819 Other persistent atrial fibrillation: Secondary | ICD-10-CM

## 2019-11-18 LAB — POCT INR: INR: 2.5 (ref 2.0–3.0)

## 2019-11-18 NOTE — Patient Instructions (Signed)
Description   Continue on same dosage of Warfarin 1 tablet everyday. Recheck INR in 4 weeks. Call us with any medication changes or concerns. [facility] [phone] office. [facility] [address] [place],  [address]

## 2019-11-25 DIAGNOSIS — N35912 Unspecified bulbous urethral stricture, male: Secondary | ICD-10-CM | POA: Diagnosis not present

## 2019-11-27 ENCOUNTER — Telehealth: Payer: Self-pay | Admitting: Pharmacist

## 2019-11-27 NOTE — Telephone Encounter (Signed)
Patient having a internal urethrothomy and a penoplasty on 12/03/19 Robin called requesting clearance for patient to hold warfarin for 3 days prior to procedure.  Dr. Harlow Asa [phone] Shirlean Mylar [phone]  Patient with diagnosis of afib on warfarin for anticoagulation.    Procedure:  internal urethrothomy and a penoplast Date of procedure: 12/03/19    CHA2DS2-VASc Score = 5  This indicates a 7.2% annual risk of stroke. The patient's score is based upon: CHF History: 1 HTN History: 1 Diabetes History: 0 Stroke History: 0 Vascular Disease History: 1 Age Score: 2 Gender Score: 0      Patient has hx of afib and recurrent VTE (initial DVT in 2006 after prostate surgery, bilateral PE in 2013, DVT of R subclavian vein in 2014 at time of knee infection). He was cleared by Dr. Harriet Masson a few months ago to hold warfarin 5 days without a bridge.   Ok to hold warfarin 3 days prior to procedure.

## 2019-11-28 NOTE — Telephone Encounter (Signed)
   Primary Cardiologist: Shirlee More, MD  Chart reviewed as part of pre-operative protocol coverage. Recent left heart cath on 09/12/2019 showed patent stents with only mild in-stent restenosis. No intervention needed. Patient was last seen by Dr. Bettina Gavia on 09/19/2019 at which time he was doing well from a cardiac standpoint. Patient contacted today for further pre-op evaluation. He states he is doing well from a cardiac standpoint. No chest pain. Breathing better now that CPAP has been adjusted. No shortness of breath, orthopnea, PND, edema, palpitations, syncope. Staying active and able to complete >4.0 METS. Given past medical history and time since last visit, based on ACC/AHA guidelines, Khiree P Diantonio would be at acceptable risk for the planned procedure without further cardiovascular testing.  Per Pharmacy, "ok to hold Warfarin for 3 days prior to procedure." No Lovenox bridge needed. Discussed this with patient. Patient has history of TAVR - Also confirmed with Pharmacy that SBE prophylaxis is not needed for procedure.   I will route this recommendation to the requesting party via Epic fax function and remove from pre-op pool.  Please call with questions.  Darreld Mclean, PA-C 11/28/2019, 5:04 PM

## 2019-11-28 NOTE — Telephone Encounter (Signed)
Per Shirlean Mylar with Dr. Puschinsky's office needing clearance for Warfarin, however anesthesia will also be general. I assured Robin I will let the cardiologist know and we will send clearance over once cleared. Shirlean Mylar thanked me for the call.

## 2019-11-28 NOTE — Telephone Encounter (Signed)
Pre-op covering staff, can we see if requesting surgeon's office was wanting medical clearance as well or just pharmacy clearance? If he needs medical clearance, what type of anesthesia will be used?  Thank you!

## 2019-12-03 DIAGNOSIS — N471 Phimosis: Secondary | ICD-10-CM | POA: Diagnosis not present

## 2019-12-03 DIAGNOSIS — L308 Other specified dermatitis: Secondary | ICD-10-CM | POA: Diagnosis not present

## 2019-12-03 DIAGNOSIS — N35912 Unspecified bulbous urethral stricture, male: Secondary | ICD-10-CM | POA: Insufficient documentation

## 2019-12-03 DIAGNOSIS — N4889 Other specified disorders of penis: Secondary | ICD-10-CM | POA: Diagnosis not present

## 2019-12-03 DIAGNOSIS — N4883 Acquired buried penis: Secondary | ICD-10-CM | POA: Diagnosis not present

## 2019-12-03 DIAGNOSIS — Z7901 Long term (current) use of anticoagulants: Secondary | ICD-10-CM | POA: Diagnosis not present

## 2019-12-03 DIAGNOSIS — Q544 Congenital chordee: Secondary | ICD-10-CM | POA: Diagnosis not present

## 2019-12-04 DIAGNOSIS — N35912 Unspecified bulbous urethral stricture, male: Secondary | ICD-10-CM | POA: Diagnosis not present

## 2019-12-05 ENCOUNTER — Telehealth: Payer: Self-pay | Admitting: Pharmacist

## 2019-12-05 NOTE — Telephone Encounter (Signed)
Patient called, had circumcision and cytoscopy on Wednesday.  Had bleeding at circumcision site yesterday and went back to urology.  Urologist recommended restarting warfarin tonight but patient was concerned due to continued bleeding at incision site.  Has been off warfarin since Sunday.  Since urologist is aware of incision site bleeding and patient has been off warfarin for 5 days, recommended he restart warfarin tonight and continue to watch for signs/symptoms of bleeding.  Patient will call back Monday with update.  Advised to call or report to ER over the weekend if any uncontrolled bleeding

## 2019-12-12 ENCOUNTER — Ambulatory Visit (INDEPENDENT_AMBULATORY_CARE_PROVIDER_SITE_OTHER): Payer: Medicare Other | Admitting: *Deleted

## 2019-12-12 ENCOUNTER — Other Ambulatory Visit: Payer: Self-pay

## 2019-12-12 DIAGNOSIS — Z5181 Encounter for therapeutic drug level monitoring: Secondary | ICD-10-CM | POA: Diagnosis not present

## 2019-12-12 DIAGNOSIS — Z86718 Personal history of other venous thrombosis and embolism: Secondary | ICD-10-CM | POA: Diagnosis not present

## 2019-12-12 DIAGNOSIS — I4819 Other persistent atrial fibrillation: Secondary | ICD-10-CM | POA: Diagnosis not present

## 2019-12-12 LAB — POCT INR: INR: 1.8 — AB (ref 2.0–3.0)

## 2019-12-12 NOTE — Patient Instructions (Signed)
Description   Take 1.5 tablets today and then continue on same dosage of Warfarin 1 tablet everyday. Recheck INR in 2 weeks- per pt request. Call us with any medication changes or concerns. [facility] [phone]

## 2019-12-16 ENCOUNTER — Ambulatory Visit (INDEPENDENT_AMBULATORY_CARE_PROVIDER_SITE_OTHER): Payer: Medicare Other

## 2019-12-16 DIAGNOSIS — I441 Atrioventricular block, second degree: Secondary | ICD-10-CM | POA: Diagnosis not present

## 2019-12-16 LAB — CUP PACEART REMOTE DEVICE CHECK
Battery Remaining Longevity: 66 mo
Battery Remaining Percentage: 95.5 %
Battery Voltage: 2.96 V
Brady Statistic AP VP Percent: 0 %
Brady Statistic AP VS Percent: 0 %
Brady Statistic AS VP Percent: 0 %
Brady Statistic AS VS Percent: 0 %
Brady Statistic RA Percent Paced: 1 %
Date Time Interrogation Session: 20211109020020
Implantable Lead Implant Date: 20190206
Implantable Lead Implant Date: 20190206
Implantable Lead Implant Date: 20190206
Implantable Lead Location: 753858
Implantable Lead Location: 753859
Implantable Lead Location: 753860
Implantable Pulse Generator Implant Date: 20190206
Lead Channel Impedance Value: 400 Ohm
Lead Channel Impedance Value: 440 Ohm
Lead Channel Impedance Value: 660 Ohm
Lead Channel Pacing Threshold Amplitude: 0.375 V
Lead Channel Pacing Threshold Amplitude: 0.875 V
Lead Channel Pacing Threshold Amplitude: 2.5 V
Lead Channel Pacing Threshold Pulse Width: 0.4 ms
Lead Channel Pacing Threshold Pulse Width: 0.5 ms
Lead Channel Pacing Threshold Pulse Width: 0.8 ms
Lead Channel Sensing Intrinsic Amplitude: 12 mV
Lead Channel Sensing Intrinsic Amplitude: 2.7 mV
Lead Channel Setting Pacing Amplitude: 1.375
Lead Channel Setting Pacing Amplitude: 2 V
Lead Channel Setting Pacing Amplitude: 3.25 V
Lead Channel Setting Pacing Pulse Width: 0.4 ms
Lead Channel Setting Pacing Pulse Width: 0.8 ms
Lead Channel Setting Sensing Sensitivity: 2 mV
Pulse Gen Model: 3262
Pulse Gen Serial Number: 8995812

## 2019-12-18 DIAGNOSIS — R339 Retention of urine, unspecified: Secondary | ICD-10-CM | POA: Diagnosis not present

## 2019-12-18 DIAGNOSIS — C61 Malignant neoplasm of prostate: Secondary | ICD-10-CM | POA: Diagnosis not present

## 2019-12-18 NOTE — Progress Notes (Signed)
Remote pacemaker transmission.   

## 2019-12-22 ENCOUNTER — Ambulatory Visit: Payer: Medicare Other | Admitting: Cardiology

## 2019-12-26 ENCOUNTER — Telehealth: Payer: Self-pay

## 2019-12-26 NOTE — Telephone Encounter (Signed)
Pt called into clinic asking if he should cut his Warfarin dosage in half secondary to bleeding post surgical circumcision on 12/03/19.  INR on 12/12/19 was 1.8 on 2.5mg  (1 tablet) daily. Asked pt amount of blood and he states less than 1/2 tsp from incision site.  Urologist is aware of bleeding and has advised pt is continues to bleed he may have to go back to OR and be stiched up again.  Advised pt bleeding seems to be a result of the surgery not the Warfarin being supratherapeutic.  Offered pt an appt today in clinic to check his INR but pt declined.  He states it is secondary to the surgery and is a small amount.  I advised pt halving his dosage of Warfarin would make his INR subtherapeutic and put him at risk for a clot or stroke.  We would not recommend decreasing his current dosage of Warfarin unless we checked his INR and it was elevated.  Advised pt to call urologist and update them on his current status as far as amounts of bleeding and follow his recommendations since he know what is usual and unusual after this type of suregery.  If bleeding persists or increases go to the ED. Pt verbalized understanding.

## 2019-12-30 ENCOUNTER — Ambulatory Visit (INDEPENDENT_AMBULATORY_CARE_PROVIDER_SITE_OTHER): Payer: Medicare Other | Admitting: *Deleted

## 2019-12-30 ENCOUNTER — Other Ambulatory Visit: Payer: Self-pay

## 2019-12-30 DIAGNOSIS — Z86718 Personal history of other venous thrombosis and embolism: Secondary | ICD-10-CM

## 2019-12-30 DIAGNOSIS — Z5181 Encounter for therapeutic drug level monitoring: Secondary | ICD-10-CM

## 2019-12-30 DIAGNOSIS — I4819 Other persistent atrial fibrillation: Secondary | ICD-10-CM | POA: Diagnosis not present

## 2019-12-30 LAB — POCT INR: INR: 2.7 (ref 2.0–3.0)

## 2019-12-30 NOTE — Patient Instructions (Signed)
Description   Continue taking Warfarin 1 tablet everyday. Recheck INR in 3 weeks. Call us with any medication changes or concerns. [facility] [phone], Main [phone], Fax [phone]

## 2020-01-08 ENCOUNTER — Other Ambulatory Visit: Payer: Self-pay | Admitting: Cardiology

## 2020-01-08 DIAGNOSIS — C61 Malignant neoplasm of prostate: Secondary | ICD-10-CM | POA: Diagnosis not present

## 2020-01-08 NOTE — Telephone Encounter (Signed)
Pravastatin refill request sent.

## 2020-01-12 ENCOUNTER — Other Ambulatory Visit: Payer: Self-pay | Admitting: Cardiology

## 2020-01-12 MED ORDER — PANTOPRAZOLE SODIUM 40 MG PO TBEC
40.0000 mg | DELAYED_RELEASE_TABLET | Freq: Every day | ORAL | 3 refills | Status: DC
Start: 2020-01-12 — End: 2020-04-13

## 2020-01-12 MED ORDER — ISOSORBIDE MONONITRATE ER 30 MG PO TB24: 15.0000 mg | ORAL_TABLET | Freq: Every day | ORAL | 3 refills | Status: DC

## 2020-01-12 NOTE — Telephone Encounter (Signed)
Refill sent to pharmacy.   

## 2020-01-12 NOTE — Telephone Encounter (Signed)
Refill sent in per request.  

## 2020-01-12 NOTE — Telephone Encounter (Signed)
°*  STAT* If patient is at the pharmacy, call can be transferred to refill team.   1. Which medications need to be refilled? (please list name of each medication and dose if known) isosorbide mononitrate (IMDUR) 30 MG 24 hr tablet  2. Which pharmacy/location (including street and city if local pharmacy) is medication to be sent to? isosorbide mononitrate (IMDUR) 30 MG 24 hr tablet  3. Do they need a 30 day or 90 day supply? 90 day supply

## 2020-01-12 NOTE — Telephone Encounter (Signed)
New Message:     [name] wants to know if Dr Bettina Gavia is the one who prescribed pt's Pantoprazole? If so, needs it called in to [facility].

## 2020-01-20 ENCOUNTER — Ambulatory Visit (INDEPENDENT_AMBULATORY_CARE_PROVIDER_SITE_OTHER): Payer: Medicare Other | Admitting: *Deleted

## 2020-01-20 ENCOUNTER — Other Ambulatory Visit: Payer: Self-pay

## 2020-01-20 DIAGNOSIS — Z86718 Personal history of other venous thrombosis and embolism: Secondary | ICD-10-CM | POA: Diagnosis not present

## 2020-01-20 DIAGNOSIS — Z5181 Encounter for therapeutic drug level monitoring: Secondary | ICD-10-CM | POA: Diagnosis not present

## 2020-01-20 DIAGNOSIS — I4819 Other persistent atrial fibrillation: Secondary | ICD-10-CM

## 2020-01-20 LAB — POCT INR: INR: 2.2 (ref 2.0–3.0)

## 2020-01-20 NOTE — Patient Instructions (Signed)
Description   Continue taking Warfarin 1 tablet everyday. Recheck INR in 4 weeks. Call us with any medication changes or concerns. [facility] [phone], Main [phone], Fax [phone]

## 2020-02-05 ENCOUNTER — Telehealth: Payer: Self-pay | Admitting: Cardiology

## 2020-02-05 DIAGNOSIS — E785 Hyperlipidemia, unspecified: Secondary | ICD-10-CM | POA: Diagnosis not present

## 2020-02-05 DIAGNOSIS — N183 Chronic kidney disease, stage 3 unspecified: Secondary | ICD-10-CM | POA: Diagnosis not present

## 2020-02-05 DIAGNOSIS — K219 Gastro-esophageal reflux disease without esophagitis: Secondary | ICD-10-CM | POA: Diagnosis not present

## 2020-02-05 DIAGNOSIS — E7800 Pure hypercholesterolemia, unspecified: Secondary | ICD-10-CM | POA: Diagnosis not present

## 2020-02-05 DIAGNOSIS — C61 Malignant neoplasm of prostate: Secondary | ICD-10-CM | POA: Diagnosis not present

## 2020-02-05 DIAGNOSIS — E038 Other specified hypothyroidism: Secondary | ICD-10-CM | POA: Diagnosis not present

## 2020-02-05 DIAGNOSIS — I10 Essential (primary) hypertension: Secondary | ICD-10-CM | POA: Diagnosis not present

## 2020-02-05 DIAGNOSIS — Z8546 Personal history of malignant neoplasm of prostate: Secondary | ICD-10-CM | POA: Diagnosis not present

## 2020-02-05 DIAGNOSIS — I2510 Atherosclerotic heart disease of native coronary artery without angina pectoris: Secondary | ICD-10-CM | POA: Diagnosis not present

## 2020-02-05 DIAGNOSIS — I4891 Unspecified atrial fibrillation: Secondary | ICD-10-CM | POA: Diagnosis not present

## 2020-02-05 DIAGNOSIS — M1990 Unspecified osteoarthritis, unspecified site: Secondary | ICD-10-CM | POA: Diagnosis not present

## 2020-02-05 MED ORDER — ISOSORBIDE MONONITRATE ER 30 MG PO TB24: 15.0000 mg | ORAL_TABLET | Freq: Every day | ORAL | 3 refills | Status: DC

## 2020-02-05 NOTE — Telephone Encounter (Signed)
Spoke with pt, Follow up scheduled and Refill sent to the pharmacy electronically.  

## 2020-02-05 NOTE — Telephone Encounter (Signed)
*  STAT* If patient is at the pharmacy, call can be transferred to refill team.   1. Which medications need to be refilled? (please list name of each medication and dose if known) Isosorbide  2. Which pharmacy/location (including street and city if local pharmacy) is medication to be sent to? [facility]  3. Do they need a 30 day or 90 day supply? 90 days and refills

## 2020-02-19 ENCOUNTER — Other Ambulatory Visit: Payer: Self-pay

## 2020-02-19 ENCOUNTER — Ambulatory Visit (INDEPENDENT_AMBULATORY_CARE_PROVIDER_SITE_OTHER): Payer: Medicare Other | Admitting: Pharmacist

## 2020-02-19 DIAGNOSIS — Z86718 Personal history of other venous thrombosis and embolism: Secondary | ICD-10-CM | POA: Diagnosis not present

## 2020-02-19 DIAGNOSIS — I4819 Other persistent atrial fibrillation: Secondary | ICD-10-CM | POA: Diagnosis not present

## 2020-02-19 LAB — POCT INR: INR: 2.3 (ref 2.0–3.0)

## 2020-02-19 NOTE — Patient Instructions (Signed)
Description   Continue taking Warfarin 1 tablet everyday. Recheck INR in 6 weeks. Call us with any medication changes or concerns. [facility] [phone], Main [phone], Fax [phone]     

## 2020-02-20 ENCOUNTER — Telehealth: Payer: Self-pay | Admitting: Cardiology

## 2020-02-20 NOTE — Telephone Encounter (Signed)
Attempted to call the patient back, no answer and left a message to return call to office.

## 2020-02-20 NOTE — Telephone Encounter (Signed)
Pt c/o medication issue:  1. Name of Medication: isosorbide mononitrate (IMDUR) 30 MG 24 hr tablet  2. How are you currently taking this medication (dosage and times per day)? 1/2 tablet by mouth daily   3. Are you having a reaction (difficulty breathing--STAT)? No   4. What is your medication issue? [name] is requesting a callback from a nurse for medication confirmation. He states he thought when he previously received this prescription from [facility] he was taking 1/2 a tablet of 15 MG's and now it's 1/2 of 30. He is wanting to confirm he isn't taking too much medication due to a mix up. Please advise.

## 2020-02-23 NOTE — Telephone Encounter (Signed)
Left vm for pt

## 2020-02-24 NOTE — Telephone Encounter (Signed)
Left message on patients voicemail to please return our call.   I will mail the patient a letter at this time after trying to reach him with no success x3

## 2020-03-02 DIAGNOSIS — M1990 Unspecified osteoarthritis, unspecified site: Secondary | ICD-10-CM | POA: Insufficient documentation

## 2020-03-02 DIAGNOSIS — K219 Gastro-esophageal reflux disease without esophagitis: Secondary | ICD-10-CM | POA: Insufficient documentation

## 2020-03-02 DIAGNOSIS — Z87442 Personal history of urinary calculi: Secondary | ICD-10-CM | POA: Insufficient documentation

## 2020-03-02 DIAGNOSIS — I350 Nonrheumatic aortic (valve) stenosis: Secondary | ICD-10-CM | POA: Insufficient documentation

## 2020-03-02 DIAGNOSIS — Z8711 Personal history of peptic ulcer disease: Secondary | ICD-10-CM | POA: Insufficient documentation

## 2020-03-02 DIAGNOSIS — G4733 Obstructive sleep apnea (adult) (pediatric): Secondary | ICD-10-CM | POA: Insufficient documentation

## 2020-03-02 DIAGNOSIS — C61 Malignant neoplasm of prostate: Secondary | ICD-10-CM | POA: Insufficient documentation

## 2020-03-02 DIAGNOSIS — Z9989 Dependence on other enabling machines and devices: Secondary | ICD-10-CM | POA: Insufficient documentation

## 2020-03-02 DIAGNOSIS — I2510 Atherosclerotic heart disease of native coronary artery without angina pectoris: Secondary | ICD-10-CM | POA: Insufficient documentation

## 2020-03-10 ENCOUNTER — Other Ambulatory Visit: Payer: Self-pay

## 2020-03-10 ENCOUNTER — Encounter: Payer: Self-pay | Admitting: Cardiology

## 2020-03-10 ENCOUNTER — Ambulatory Visit (INDEPENDENT_AMBULATORY_CARE_PROVIDER_SITE_OTHER): Payer: Medicare Other | Admitting: Cardiology

## 2020-03-10 VITALS — BP 132/76 | HR 62 | Ht 72.0 in | Wt 229.0 lb

## 2020-03-10 DIAGNOSIS — I5042 Chronic combined systolic (congestive) and diastolic (congestive) heart failure: Secondary | ICD-10-CM | POA: Diagnosis not present

## 2020-03-10 DIAGNOSIS — I4819 Other persistent atrial fibrillation: Secondary | ICD-10-CM | POA: Diagnosis not present

## 2020-03-10 DIAGNOSIS — Z7901 Long term (current) use of anticoagulants: Secondary | ICD-10-CM

## 2020-03-10 DIAGNOSIS — Z950 Presence of cardiac pacemaker: Secondary | ICD-10-CM | POA: Diagnosis not present

## 2020-03-10 DIAGNOSIS — Z952 Presence of prosthetic heart valve: Secondary | ICD-10-CM

## 2020-03-10 DIAGNOSIS — E782 Mixed hyperlipidemia: Secondary | ICD-10-CM

## 2020-03-10 DIAGNOSIS — I25118 Atherosclerotic heart disease of native coronary artery with other forms of angina pectoris: Secondary | ICD-10-CM

## 2020-03-10 DIAGNOSIS — I110 Hypertensive heart disease with heart failure: Secondary | ICD-10-CM | POA: Diagnosis not present

## 2020-03-10 NOTE — Progress Notes (Signed)
Cardiology Office Note:    Date:  03/10/2020   ID:  [name], DOB January 03, 1932, MRN [id number]  PCP:  Noah Frees, MD  Cardiologist:  Noah More, MD    Referring MD: Noah Frees, MD    ASSESSMENT:    1. S/P TAVR (transcatheter aortic valve replacement)   2. 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 Persistent atrial fibrillation ([place])   4. Biventricular cardiac pacemaker in situ   5. Long term current use of anticoagulant   6. Mixed hyperlipidemia   7. Hypertensive heart disease with chronic combined systolic and diastolic congestive heart failure ([place])    PLAN:    In order of problems listed above:  1. He continues to do well following his cardiac interventions in 2020 and will need a follow-up echocardiogram after his next visit. 2. Stable having no anginal discomfort continue medical therapy including calcium channel blocker oral nitrates anticoagulant and lipid-lowering 3. Stable rate controlled continue anticoagulation 4. Stable pacemaker function he is rarely paced 5. Lipids at target he declined a lipid profile we will plan to do at his next visit 6 months 6. BP at target heart failure compensated we will continue small dose of loop diuretic   Next appointment: 6 months   Medication Adjustments/Labs and Tests Ordered: Current medicines are reviewed at length with the patient today.  Concerns regarding medicines are outlined above.  No orders of the defined types were placed in this encounter.  No orders of the defined types were placed in this encounter.   Chief Complaint  Patient presents with  . Follow-up    For TAVR and CAD    History of Present Illness:    [name] is a 87 y.o. male with a hx of aortic stenosis TAVR 03/19/2018 CAD PCI and drug-eluting stent right coronary artery same date chronic atrial fibrillation anticoagulated on warfarin recurrent venous thromboembolism  status post IVC filter hypertensive heart disease with heart failure and permanent pacemaker.  He was last seen 09/19/2019.  Compliance with diet, lifestyle and medications: Yes  Is in good spirits pleased with the quality of his life and said no chest pain shortness of breath palpitation or syncope. He tolerates his lipid-lowering therapy without muscle pain or weakness, I offered to check a lipid profile and he declined till his next visit. He has had no further anginal discomfort on current treatment including calcium channel blocker and oral nitrates. No edema shortness of breath palpitation or syncope.  Last device check 12/16/2019 showed normal lead and device parameters battery longevity 5-1/2 years and is ventricularly paced 0% of the time.  Programmed DDDR mode. He had a repeat coronary angiogram performed 09/12/2019 which showed patent stent to the right coronary artery and moderate nonobstructive distal disease in the other vessels unchanged from his previous angiogram.  He also has a stent left circumflex artery continued patency. Past Medical History:  Diagnosis Date  . Aortic valve stenosis 04/05/2015   Formatting of this note might be different from the original. Overview:  ECHO 5/15 Formatting of this note might be different from the original. Overview:  Overview:  ECHO 5/15  . Arthritis    "knees, right shoulder" (03/14/2017)  . Atrial fibrillation, persistent ([place]) 09/19/2014  . CAD (coronary artery disease), native coronary artery    Cath 2011 LHC (08/2009):~ Proximal LAD 30%, mid to distal LAD 25%, ostial small D1 75% mid AV groove circumflex 99% been subtotal  stenosis, proximal to mid RCA 25-30%, mid RCA 30%, mid PDA 30%, EF 50% with inferior hypokinesis.;    July 2011  PCI and DES to circumflex [name]   ETT-Myoview (06/2013):  Inferolateral scar, mild peri-infarct ischemia, EF 40%; Intermediate Risk   ECHO EF 55% 03/2013   . CAD (coronary artery disease), native coronary artery  04/05/2015   Cath 2011 LHC (08/2009):~ Proximal LAD 30%, mid to distal LAD 25%, ostial small D1 75% mid AV groove circumflex 99% been subtotal stenosis, proximal to mid RCA 25-30%, mid RCA 30%, mid PDA 30%, EF 50% with inferior hypokinesis.;    July 2011  PCI and DES to circumflex [name]   ETT-Myoview (06/2013):  Inferolateral scar, mild peri-infarct ischemia, EF 40%; Intermediate Risk   ECHO EF 55% 03/2013    . Cardiac pacemaker in situ 03/14/2017   Biventricular St. Jude inserted 03/14/17 [name] for second degree heart block   . Chronic combined systolic and diastolic heart failure ([place]) 11/19/2017  . CKD (chronic kidney disease), stage III ([place])   . Gastro-esophageal reflux disease without esophagitis 09/24/2009  . GERD   . History of infection of prosthetic knee 04/05/2015   Treated with debridement and irrigation followed by 6 months of triple antibiotics  . History of kidney stones   . History of peptic ulcer 1970s?  Marland Kitchen History of prostate cancer    Radical prostatectomy in 2006 [name]   . Hypertensive heart disease   . Internal hemorrhoids 09/11/2018  . Long term (current) use of anticoagulants 10/06/2011  . Mixed hyperlipidemia   . Moderate persistent asthma without complication 04/06/6008  . Noise effect on both inner ears 08/14/2018  . Obesity (BMI 30-39.9)   . OSA on CPAP   . OSA treated with BiPAP 11/15/2015  . Persistent atrial fibrillation ([place]) 09/19/2014   CHA2DS2VASC score 5  Cardioversion 10/08/2014 was on amiodarone until 2018   . 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)    Initial DVT in 2006 after prostate surgery and had [place] filter placed Bilateral  PE 2013 and placed back on warfarin DVT of right subclavian vein on doppler 10/2012 at time of knee infection   . Personal history of malignant neoplasm of prostate    Radical prostatectomy in 2006 [name]    . Presbycusis of both ears 08/14/2018  . Severe aortic stenosis   . Severe  aortic stenosis 03/18/2018  . Urinary incontinence    MULTIPLE BLADDER SURGERIES - STATES NO URINARY SPHINCTER - PT'S UROLOGIST IS AT [facility]- DR. PETERSON  ( LAST VISIT WAS 09/15/11 )    Past Surgical History:  Procedure Laterality Date  . APPENDECTOMY    . BI-VENTRICULAR PACEMAKER INSERTION (CRT-P)  03/14/2017  . BIV PACEMAKER INSERTION CRT-P N/A 03/14/2017   Procedure: BIV PACEMAKER INSERTION CRT-P;  Surgeon: Evans Lance, MD;  Location: [place] [facility];  Service: Cardiovascular;  Laterality: N/A;  . CARDIOVERSION N/A 10/08/2014   Procedure: CARDIOVERSION;  Surgeon: Jacolyn Reedy, MD;  Location: [facility];  Service: Cardiovascular;  Laterality: N/A;  . CATARACT EXTRACTION W/ INTRAOCULAR LENS  IMPLANT, BILATERAL Bilateral   . CORONARY ANGIOPLASTY WITH STENT PLACEMENT  08/25/2009   DES-mid LCx 08/2009; 30% pLAD, 25% m/dLAD, 75% ostial D1, 99% mLCx s/p DES, 25-30% p/mRCA, 40% mRCA, 30% mPDA stenoses; LVEF 50%, inf hypokinesis  . CORONARY STENT INTERVENTION N/A 03/18/2018   Procedure: CORONARY STENT INTERVENTION;  Surgeon: Sherren Mocha, MD;  Location: [facility]  INVASIVE CV LAB;  Service: Cardiovascular;  Laterality: N/A;  . EXCISIONAL HEMORRHOIDECTOMY    . I & D KNEE WITH POLY EXCHANGE Left 10/09/2012   Procedure: IRRIGATION AND DEBRIDEMENT LEFT KNEE WITH POLY REVISION;  Surgeon: Gearlean Alf, MD;  Location: [facility];  Service: Orthopedics;  Laterality: Left;  . INGUINAL HERNIA REPAIR Right   . INTRAOPERATIVE TRANSTHORACIC ECHOCARDIOGRAM  03/19/2018   Procedure: Intraoperative Transthoracic Echocardiogram;  Surgeon: Sherren Mocha, MD;  Location: [place];  Service: Open Heart Surgery;;  . JOINT REPLACEMENT    . KNEE ARTHROTOMY Left 10/09/2012   Procedure: LEFT KNEE ARTHROTOMY;  Surgeon: Gearlean Alf, MD;  Location: [facility];  Service: Orthopedics;  Laterality: Left;  . LEFT HEART CATH AND CORONARY ANGIOGRAPHY N/A 02/28/2018   Procedure: LEFT HEART CATH AND CORONARY ANGIOGRAPHY;  Surgeon: Sherren Mocha, MD;  Location: [place] [facility];  Service: Cardiovascular;  Laterality: N/A;  . LEFT HEART CATH AND CORONARY ANGIOGRAPHY N/A 09/12/2019   Procedure: LEFT HEART CATH AND CORONARY ANGIOGRAPHY;  Surgeon: Sherren Mocha, MD;  Location: [place] CV [facility];  Service: Cardiovascular;  Laterality: N/A;  . PROSTATECTOMY  04/25/2004  . REPLACEMENT TOTAL KNEE Bilateral   . SKIN BIOPSY     "off nose; wasn't cancer; it was tested" (03/14/2017)  . TRANSCATHETER AORTIC VALVE REPLACEMENT, TRANSFEMORAL N/A 03/19/2018   Procedure: TRANSCATHETER AORTIC VALVE REPLACEMENT, TRANSFEMORAL;  Surgeon: Sherren Mocha, MD;  Location: [place];  Service: Open Heart Surgery;  Laterality: N/A;  . Uretheral implants     multiple for incontinence  . VENA CAVA FILTER PLACEMENT      Current Medications: Current Meds  Medication Sig  . acetaminophen (TYLENOL) 500 MG tablet Take 500 mg by mouth at bedtime.   Marland Kitchen amLODipine (NORVASC) 2.5 MG tablet Take 1 tablet (2.5 mg total) by mouth daily.  . Ascorbic Acid (VITAMIN C) 1000 MG tablet Take 1,000 mg by mouth daily.  . Biotin 2500 MCG CAPS Take 2,500 mcg by mouth daily.  . cholecalciferol (VITAMIN D) 1000 UNITS tablet Take 1,000 Units by mouth daily.  Marland Kitchen docusate sodium (COLACE) 50 MG capsule Take 50 mg by mouth daily.  . furosemide (LASIX) 20 MG tablet Take 20 mg by mouth daily.   . Glucosamine HCl (GLUCOSAMINE PO) Take 2,000 mg by mouth daily.  . isosorbide mononitrate (IMDUR) 30 MG 24 hr tablet Take 0.5 tablets (15 mg total) by mouth daily.  . Multiple Vitamin (MULTIVITAMIN) capsule Take 1 capsule by mouth daily.   . nitroGLYCERIN (NITROSTAT) 0.4 MG SL tablet Place 1 tablet (0.4 mg total) under the tongue every 5 (five) minutes as needed.  . Omega-3 Fatty Acids (FISH OIL) 1000 MG CAPS Take 1,000 mg by mouth daily.  . pantoprazole (PROTONIX) 40 MG tablet Take 1 tablet (40 mg total) by mouth daily.  . pravastatin (PRAVACHOL) 20 MG tablet TAKE ONE TABLET BY MOUTH ONCE DAILY   . vitamin B-12 (CYANOCOBALAMIN) 500 MCG tablet Take 500 mcg by mouth daily.  Marland Kitchen warfarin (COUMADIN) 2.5 MG tablet TAKE ONE TABLET BY MOUTH ONCE DAILY     Allergies:   Patient has no known allergies.   Social History   Socioeconomic History  . Marital status: Married    Spouse name: Not on file  . Number of children: 3  . Years of education: Not on file  . Highest education level: Not on file  Occupational History  . Occupation: Retired Therapist, nutritional  Tobacco Use  . Smoking status: Former Smoker  Packs/day: 1.00    Years: 10.00    Pack years: 10.00    Types: Cigarettes    Quit date: 04/27/1961    Years since quitting: 58.9  . Smokeless tobacco: Never Used  Vaping Use  . Vaping Use: Never used  Substance and Sexual Activity  . Alcohol use: No    Alcohol/week: 0.0 standard drinks  . Drug use: No  . Sexual activity: Not on file  Other Topics Concern  . Not on file  Social History Narrative  . Not on file   Social Determinants of Health   Financial Resource Strain: Not on file  Food Insecurity: Not on file  Transportation Needs: Not on file  Physical Activity: Not on file  Stress: Not on file  Social Connections: Not on file     Family History: The patient's family history includes Melanoma in his brother and father. ROS:   Please see the history of present illness.    All other systems reviewed and are negative.  EKGs/Labs/Other Studies Reviewed:    The following studies were reviewed today:    Recent Labs: 04/16/2019: Magnesium 1.9 07/14/2019: ALT 5 09/08/2019: BUN 22; Creatinine, Ser 1.42; Hemoglobin 16.2; Platelets 166; Potassium 4.1; Sodium 141  Recent Lipid Panel    Component Value Date/Time   CHOL 118 07/14/2019 1343   TRIG 141 07/14/2019 1343   HDL 32 (L) 07/14/2019 1343   CHOLHDL 3.7 07/14/2019 1343   CHOLHDL 3 04/28/2011 1003   VLDL 17.0 04/28/2011 1003   LDLCALC 61 07/14/2019 1343    Physical Exam:    VS:  BP 132/76   Pulse 62   Ht 6'  (1.829 m)   Wt 229 lb (103.9 kg)   SpO2 97%   BMI 31.06 kg/m     Wt Readings from Last 3 Encounters:  03/10/20 229 lb (103.9 kg)  09/19/19 235 lb (106.6 kg)  09/12/19 236 lb (107 kg)     GEN:  Well nourished, well developed in no acute distress HEENT: Normal NECK: No JVD; No carotid bruits LYMPHATICS: No lymphadenopathy CARDIAC: RRR, no murmurs, rubs, gallops RESPIRATORY:  Clear to auscultation without rales, wheezing or rhonchi  ABDOMEN: Soft, non-tender, non-distended MUSCULOSKELETAL:  No edema; No deformity  SKIN: Warm and dry NEUROLOGIC:  Alert and oriented x 3 PSYCHIATRIC:  Normal affect    Signed, Noah More, MD  03/10/2020 1:19 PM    [facility]

## 2020-03-10 NOTE — Patient Instructions (Signed)

## 2020-03-11 DIAGNOSIS — E038 Other specified hypothyroidism: Secondary | ICD-10-CM | POA: Diagnosis not present

## 2020-03-11 DIAGNOSIS — C61 Malignant neoplasm of prostate: Secondary | ICD-10-CM | POA: Diagnosis not present

## 2020-03-11 DIAGNOSIS — I2510 Atherosclerotic heart disease of native coronary artery without angina pectoris: Secondary | ICD-10-CM | POA: Diagnosis not present

## 2020-03-11 DIAGNOSIS — E785 Hyperlipidemia, unspecified: Secondary | ICD-10-CM | POA: Diagnosis not present

## 2020-03-11 DIAGNOSIS — I4891 Unspecified atrial fibrillation: Secondary | ICD-10-CM | POA: Diagnosis not present

## 2020-03-11 DIAGNOSIS — K219 Gastro-esophageal reflux disease without esophagitis: Secondary | ICD-10-CM | POA: Diagnosis not present

## 2020-03-11 DIAGNOSIS — E7800 Pure hypercholesterolemia, unspecified: Secondary | ICD-10-CM | POA: Diagnosis not present

## 2020-03-11 DIAGNOSIS — M1990 Unspecified osteoarthritis, unspecified site: Secondary | ICD-10-CM | POA: Diagnosis not present

## 2020-03-11 DIAGNOSIS — Z8546 Personal history of malignant neoplasm of prostate: Secondary | ICD-10-CM | POA: Diagnosis not present

## 2020-03-11 DIAGNOSIS — N183 Chronic kidney disease, stage 3 unspecified: Secondary | ICD-10-CM | POA: Diagnosis not present

## 2020-03-11 DIAGNOSIS — I10 Essential (primary) hypertension: Secondary | ICD-10-CM | POA: Diagnosis not present

## 2020-03-12 DIAGNOSIS — C61 Malignant neoplasm of prostate: Secondary | ICD-10-CM | POA: Diagnosis not present

## 2020-03-16 ENCOUNTER — Ambulatory Visit (INDEPENDENT_AMBULATORY_CARE_PROVIDER_SITE_OTHER): Payer: Medicare Other

## 2020-03-16 DIAGNOSIS — I441 Atrioventricular block, second degree: Secondary | ICD-10-CM

## 2020-03-16 LAB — CUP PACEART REMOTE DEVICE CHECK
Battery Remaining Longevity: 62 mo
Battery Remaining Percentage: 89 %
Battery Voltage: 2.95 V
Brady Statistic AP VP Percent: 0 %
Brady Statistic AP VS Percent: 0 %
Brady Statistic AS VP Percent: 100 %
Brady Statistic AS VS Percent: 0 %
Brady Statistic RA Percent Paced: 1 %
Date Time Interrogation Session: 20220208043708
Implantable Lead Implant Date: 20190206
Implantable Lead Implant Date: 20190206
Implantable Lead Implant Date: 20190206
Implantable Lead Location: 753858
Implantable Lead Location: 753859
Implantable Lead Location: 753860
Implantable Pulse Generator Implant Date: 20190206
Lead Channel Impedance Value: 400 Ohm
Lead Channel Impedance Value: 440 Ohm
Lead Channel Impedance Value: 700 Ohm
Lead Channel Pacing Threshold Amplitude: 0.375 V
Lead Channel Pacing Threshold Amplitude: 0.875 V
Lead Channel Pacing Threshold Amplitude: 2.5 V
Lead Channel Pacing Threshold Pulse Width: 0.4 ms
Lead Channel Pacing Threshold Pulse Width: 0.5 ms
Lead Channel Pacing Threshold Pulse Width: 0.8 ms
Lead Channel Sensing Intrinsic Amplitude: 12 mV
Lead Channel Sensing Intrinsic Amplitude: 2.7 mV
Lead Channel Setting Pacing Amplitude: 1.375
Lead Channel Setting Pacing Amplitude: 2 V
Lead Channel Setting Pacing Amplitude: 3.25 V
Lead Channel Setting Pacing Pulse Width: 0.4 ms
Lead Channel Setting Pacing Pulse Width: 0.8 ms
Lead Channel Setting Sensing Sensitivity: 2 mV
Pulse Gen Model: 3262
Pulse Gen Serial Number: 8995812

## 2020-03-22 NOTE — Progress Notes (Signed)
Remote pacemaker transmission.   

## 2020-04-01 ENCOUNTER — Ambulatory Visit (INDEPENDENT_AMBULATORY_CARE_PROVIDER_SITE_OTHER): Payer: Medicare Other | Admitting: *Deleted

## 2020-04-01 ENCOUNTER — Other Ambulatory Visit: Payer: Self-pay

## 2020-04-01 DIAGNOSIS — I4819 Other persistent atrial fibrillation: Secondary | ICD-10-CM | POA: Diagnosis not present

## 2020-04-01 DIAGNOSIS — Z86718 Personal history of other venous thrombosis and embolism: Secondary | ICD-10-CM | POA: Diagnosis not present

## 2020-04-01 LAB — POCT INR: INR: 2.7 (ref 2.0–3.0)

## 2020-04-01 NOTE — Patient Instructions (Signed)
Description   Continue taking Warfarin 1 tablet everyday. Recheck INR in 6 weeks. Call us with any medication changes or concerns. [facility] [phone], Main [phone], Fax [phone]

## 2020-04-13 ENCOUNTER — Other Ambulatory Visit: Payer: Self-pay | Admitting: Physician Assistant

## 2020-04-13 DIAGNOSIS — R300 Dysuria: Secondary | ICD-10-CM | POA: Diagnosis not present

## 2020-04-13 DIAGNOSIS — R109 Unspecified abdominal pain: Secondary | ICD-10-CM | POA: Diagnosis not present

## 2020-04-13 NOTE — Telephone Encounter (Signed)
This is a [facility] pt, Dr. Munley 

## 2020-04-16 ENCOUNTER — Other Ambulatory Visit: Payer: Self-pay

## 2020-04-16 MED ORDER — WARFARIN SODIUM 2.5 MG PO TABS
ORAL_TABLET | ORAL | 1 refills | Status: DC
Start: 2020-04-16 — End: 2020-08-29

## 2020-04-20 ENCOUNTER — Encounter: Payer: Self-pay | Admitting: Cardiology

## 2020-04-20 NOTE — Telephone Encounter (Signed)
error 

## 2020-04-21 ENCOUNTER — Other Ambulatory Visit: Payer: Self-pay

## 2020-04-21 ENCOUNTER — Telehealth: Payer: Self-pay

## 2020-04-21 MED ORDER — OMEPRAZOLE 20 MG PO CPDR: 20.0000 mg | DELAYED_RELEASE_CAPSULE | Freq: Every day | ORAL | 3 refills | Status: DC

## 2020-04-21 NOTE — Progress Notes (Signed)
pril 

## 2020-04-21 NOTE — Telephone Encounter (Signed)
-----   Message from Brynda Peon, RN sent at 04/21/2020 10:58 AM EDT ----- This is a mutual pt of Dr Joya Gaskins who called the [facility]in Clinic inquiring about his Protonix rx which he has been out of x 2 weeks.  I saw in the chart the rx had been sent in by you on 04/13/20.  I called the pharmacy to check on the status for the pt and the pharmacy stated they had faxed over a prior auth that needed to be completed by your office on this rx.  Do you know the status of this prior authorization?  Pt is upset he is unable to get rx and has been out of for some time.  Pt states he has attempted to contact Dr Oren Binet office without success multiple times.  Can you please assist this pt in getting this prior auth done so he can fill his rx?   Thanks,  [name]

## 2020-04-21 NOTE — Telephone Encounter (Signed)
Left message on the patient and the patients daughters phone number requesting that they please call me back. This is the first time I have heard that this patient was having a hard time getting this medication.   I discussed this with Dr. Bettina Gavia and he requested that we stop the Protonix and start the patient on prilosec 20 mg daily. I called this in for the patient at this time.

## 2020-04-22 ENCOUNTER — Emergency Department (HOSPITAL_COMMUNITY)
Admission: EM | Admit: 2020-04-22 | Discharge: 2020-04-23 | Disposition: A | Discharge status: Home / Self Care | Payer: Medicare Other | Attending: Emergency Medicine | Admitting: Emergency Medicine

## 2020-04-22 ENCOUNTER — Other Ambulatory Visit: Payer: Self-pay

## 2020-04-22 ENCOUNTER — Emergency Department (HOSPITAL_COMMUNITY): Payer: Medicare Other

## 2020-04-22 ENCOUNTER — Encounter (HOSPITAL_COMMUNITY): Payer: Self-pay

## 2020-04-22 DIAGNOSIS — I4891 Unspecified atrial fibrillation: Secondary | ICD-10-CM | POA: Diagnosis not present

## 2020-04-22 DIAGNOSIS — Z96652 Presence of left artificial knee joint: Secondary | ICD-10-CM | POA: Diagnosis not present

## 2020-04-22 DIAGNOSIS — Z7901 Long term (current) use of anticoagulants: Secondary | ICD-10-CM | POA: Insufficient documentation

## 2020-04-22 DIAGNOSIS — N281 Cyst of kidney, acquired: Secondary | ICD-10-CM | POA: Diagnosis not present

## 2020-04-22 DIAGNOSIS — N200 Calculus of kidney: Secondary | ICD-10-CM | POA: Diagnosis not present

## 2020-04-22 DIAGNOSIS — R319 Hematuria, unspecified: Secondary | ICD-10-CM | POA: Diagnosis not present

## 2020-04-22 DIAGNOSIS — I5042 Chronic combined systolic (congestive) and diastolic (congestive) heart failure: Secondary | ICD-10-CM | POA: Insufficient documentation

## 2020-04-22 DIAGNOSIS — Z96651 Presence of right artificial knee joint: Secondary | ICD-10-CM | POA: Insufficient documentation

## 2020-04-22 DIAGNOSIS — Z950 Presence of cardiac pacemaker: Secondary | ICD-10-CM | POA: Insufficient documentation

## 2020-04-22 DIAGNOSIS — N23 Unspecified renal colic: Secondary | ICD-10-CM | POA: Diagnosis not present

## 2020-04-22 DIAGNOSIS — Z955 Presence of coronary angioplasty implant and graft: Secondary | ICD-10-CM | POA: Diagnosis not present

## 2020-04-22 DIAGNOSIS — A419 Sepsis, unspecified organism: Secondary | ICD-10-CM | POA: Diagnosis not present

## 2020-04-22 DIAGNOSIS — K219 Gastro-esophageal reflux disease without esophagitis: Secondary | ICD-10-CM | POA: Diagnosis not present

## 2020-04-22 DIAGNOSIS — N183 Chronic kidney disease, stage 3 unspecified: Secondary | ICD-10-CM | POA: Insufficient documentation

## 2020-04-22 DIAGNOSIS — Z87891 Personal history of nicotine dependence: Secondary | ICD-10-CM | POA: Insufficient documentation

## 2020-04-22 DIAGNOSIS — I2510 Atherosclerotic heart disease of native coronary artery without angina pectoris: Secondary | ICD-10-CM | POA: Insufficient documentation

## 2020-04-22 DIAGNOSIS — Z8546 Personal history of malignant neoplasm of prostate: Secondary | ICD-10-CM | POA: Diagnosis not present

## 2020-04-22 DIAGNOSIS — I130 Hypertensive heart and chronic kidney disease with heart failure and stage 1 through stage 4 chronic kidney disease, or unspecified chronic kidney disease: Secondary | ICD-10-CM | POA: Insufficient documentation

## 2020-04-22 DIAGNOSIS — J45909 Unspecified asthma, uncomplicated: Secondary | ICD-10-CM | POA: Insufficient documentation

## 2020-04-22 DIAGNOSIS — R509 Fever, unspecified: Secondary | ICD-10-CM | POA: Diagnosis not present

## 2020-04-22 DIAGNOSIS — K8020 Calculus of gallbladder without cholecystitis without obstruction: Secondary | ICD-10-CM | POA: Diagnosis not present

## 2020-04-22 DIAGNOSIS — N390 Urinary tract infection, site not specified: Secondary | ICD-10-CM | POA: Diagnosis not present

## 2020-04-22 DIAGNOSIS — R109 Unspecified abdominal pain: Secondary | ICD-10-CM | POA: Diagnosis present

## 2020-04-22 DIAGNOSIS — Z79899 Other long term (current) drug therapy: Secondary | ICD-10-CM | POA: Insufficient documentation

## 2020-04-22 LAB — COMPREHENSIVE METABOLIC PANEL
ALT: 12 U/L (ref 0–44)
AST: 29 U/L (ref 15–41)
Albumin: 3.2 g/dL — ABNORMAL LOW (ref 3.5–5.0)
Alkaline Phosphatase: 92 U/L (ref 38–126)
Anion gap: 10 (ref 5–15)
BUN: 27 mg/dL — ABNORMAL HIGH (ref 8–23)
CO2: 21 mmol/L — ABNORMAL LOW (ref 22–32)
Calcium: 9.5 mg/dL (ref 8.9–10.3)
Chloride: 99 mmol/L (ref 98–111)
Creatinine, Ser: 1.62 mg/dL — ABNORMAL HIGH (ref 0.61–1.24)
GFR, Estimated: 41 mL/min — ABNORMAL LOW (ref >60)
Glucose, Bld: 123 mg/dL — ABNORMAL HIGH (ref 70–99)
Potassium: 4.8 mmol/L (ref 3.5–5.1)
Sodium: 130 mmol/L — ABNORMAL LOW (ref 135–145)
Total Bilirubin: 1.6 mg/dL — ABNORMAL HIGH (ref 0.3–1.2)
Total Protein: 7.1 g/dL (ref 6.5–8.1)

## 2020-04-22 LAB — CBC WITH DIFFERENTIAL/PLATELET
Abs Immature Granulocytes: 0.15 10*3/uL — ABNORMAL HIGH (ref 0.00–0.07)
Basophils Absolute: 0.1 10*3/uL (ref 0.0–0.1)
Basophils Relative: 0 %
Eosinophils Absolute: 0 10*3/uL (ref 0.0–0.5)
Eosinophils Relative: 0 %
HCT: 47.1 % (ref 39.0–52.0)
Hemoglobin: 15.2 g/dL (ref 13.0–17.0)
Immature Granulocytes: 1 %
Lymphocytes Relative: 5 %
Lymphs Abs: 0.8 10*3/uL (ref 0.7–4.0)
MCH: 26.1 pg (ref 26.0–34.0)
MCHC: 32.3 g/dL (ref 30.0–36.0)
MCV: 80.9 fL (ref 80.0–100.0)
Monocytes Absolute: 1 10*3/uL (ref 0.1–1.0)
Monocytes Relative: 6 %
Neutro Abs: 15 10*3/uL — ABNORMAL HIGH (ref 1.7–7.7)
Neutrophils Relative %: 88 %
Platelets: 290 10*3/uL (ref 150–400)
RBC: 5.82 MIL/uL — ABNORMAL HIGH (ref 4.22–5.81)
RDW: 17.9 % — ABNORMAL HIGH (ref 11.5–15.5)
WBC: 17 10*3/uL — ABNORMAL HIGH (ref 4.0–10.5)
nRBC: 0 % (ref 0.0–0.2)

## 2020-04-22 LAB — LACTIC ACID, PLASMA: Lactic Acid, Venous: 1.8 mmol/L (ref 0.5–1.9)

## 2020-04-22 LAB — PROTIME-INR
INR: 2.7 — ABNORMAL HIGH (ref 0.8–1.2)
Prothrombin Time: 28 seconds — ABNORMAL HIGH (ref 11.4–15.2)

## 2020-04-22 LAB — APTT: aPTT: 43 seconds — ABNORMAL HIGH (ref 24–36)

## 2020-04-22 MED ORDER — ACETAMINOPHEN 325 MG PO TABS
650.0000 mg | ORAL_TABLET | Freq: Once | ORAL | Status: AC
  Administered 2020-04-23: 650 mg via ORAL
  Filled 2020-04-22: qty 2

## 2020-04-22 NOTE — ED Triage Notes (Signed)
Pt arrives with c/o fevers at home as high as 101. Pt reports recently passing a kidney stone and not doing very well on the abx.

## 2020-04-22 NOTE — ED Provider Notes (Signed)
[facility] DEPT Provider Note: Georgena Spurling, MD, FACEP  CSN: [id number] MRN: [id number] ARRIVAL: 04/22/20 at 2056 ROOM: [place]   HISTORY OF PRESENT ILLNESS  04/22/20 11:14 PM [name] is a 87 y.o. male with a known large right-sided kidney stone.  4 days ago he developed right flank pain, chills and confusion.  He was seen at an urgent care and diagnosed with a UTI and was placed on Bactrim and tramadol.  He has continued to have intermittent pain in his right flank which he states gets "pretty bad" at times although he is having no pain at the present time.  This evening he developed confusion, chills and fever to 101.9 so he presented to the ED.  On arrival here his temperature was 102.7.  His flank pain is not worse with movement or palpation.  He is not continent of urine due to prostate cancer surgery.   Past Medical History:  Diagnosis Date  . Aortic valve stenosis 04/05/2015   Formatting of this note might be different from the original. Overview:  ECHO 5/15 Formatting of this note might be different from the original. Overview:  Overview:  ECHO 5/15  . Arthritis    "knees, right shoulder" (03/14/2017)  . Atrial fibrillation, persistent ([place]) 09/19/2014  . CAD (coronary artery disease), native coronary artery    Cath 2011 LHC (08/2009):~ Proximal LAD 30%, mid to distal LAD 25%, ostial small D1 75% mid AV groove circumflex 99% been subtotal stenosis, proximal to mid RCA 25-30%, mid RCA 30%, mid PDA 30%, EF 50% with inferior hypokinesis.;    July 2011  PCI and DES to circumflex Dr. Olevia Perches   ETT-Myoview (06/2013):  Inferolateral scar, mild peri-infarct ischemia, EF 40%; Intermediate Risk   ECHO EF 55% 03/2013   . CAD (coronary artery disease), native coronary artery 04/05/2015   Cath 2011 LHC (08/2009):~ Proximal LAD 30%, mid to distal LAD 25%, ostial small D1 75% mid AV groove circumflex 99% been subtotal stenosis, proximal to mid RCA 25-30%, mid  RCA 30%, mid PDA 30%, EF 50% with inferior hypokinesis.;    July 2011  PCI and DES to circumflex Dr. Olevia Perches   ETT-Myoview (06/2013):  Inferolateral scar, mild peri-infarct ischemia, EF 40%; Intermediate Risk   ECHO EF 55% 03/2013    . Cardiac pacemaker in situ 03/14/2017   Biventricular St[employer] Jude inserted 03/14/17 Dr. Lovena Le for second degree heart block   . Chronic combined systolic and diastolic heart failure ([place]) 11/19/2017  . CKD (chronic kidney disease), stage III ([place])   . Gastro-esophageal reflux disease without esophagitis 09/24/2009  . GERD   . History of infection of prosthetic knee 04/05/2015   Treated with debridement and irrigation followed by 6 months of triple antibiotics  . History of kidney stones   . History of peptic ulcer 1970s?  Marland Kitchen History of prostate cancer    Radical prostatectomy in 2006 Dr. Terance Hart   . Hypertensive heart disease   . Internal hemorrhoids 09/11/2018  . Long term (current) use of anticoagulants 10/06/2011  . Mixed hyperlipidemia   . Moderate persistent asthma without complication 10/28/1939  . Noise effect on both inner ears 08/14/2018  . Obesity (BMI 30-39.9)   . OSA on CPAP   . OSA treated with BiPAP 11/15/2015  . Persistent atrial fibrillation ([place]) 09/19/2014   CHA2DS2VASC score 5  Cardioversion 10/08/2014 was on amiodarone until 2018   . Personal history of DVT and pulmonary embolism  (deep  vein thrombosis)    Initial DVT in 2006 after prostate surgery and had [place] filter placed Bilateral  PE 2013 and placed back on warfarin DVT of right subclavian vein on doppler 10/2012 at time of knee infection   . Personal history of malignant neoplasm of prostate    Radical prostatectomy in 2006 Dr. Terance Hart    . Presbycusis of both ears 08/14/2018  . Severe aortic stenosis   . Severe aortic stenosis 03/18/2018  . Urinary incontinence    MULTIPLE BLADDER SURGERIES - STATES NO URINARY SPHINCTER - PT'S UROLOGIST IS AT [facility]- DR. PETERSON  ( LAST VISIT WAS 09/15/11 )     Past Surgical History:  Procedure Laterality Date  . APPENDECTOMY    . BI-VENTRICULAR PACEMAKER INSERTION (CRT-P)  03/14/2017  . BIV PACEMAKER INSERTION CRT-P N/A 03/14/2017   Procedure: BIV PACEMAKER INSERTION CRT-P;  Surgeon: Evans Lance, MD;  Location: [facility];  Service: Cardiovascular;  Laterality: N/A;  . CARDIOVERSION N/A 10/08/2014   Procedure: CARDIOVERSION;  Surgeon: Jacolyn Reedy, MD;  Location: [facility];  Service: Cardiovascular;  Laterality: N/A;  . CATARACT EXTRACTION W/ INTRAOCULAR LENS  IMPLANT, BILATERAL Bilateral   . CORONARY ANGIOPLASTY WITH STENT PLACEMENT  08/25/2009   DES-mid LCx 08/2009; 30% pLAD, 25% m/dLAD, 75% ostial D1, 99% mLCx s/p DES, 25-30% p/mRCA, 40% mRCA, 30% mPDA stenoses; LVEF 50%, inf hypokinesis  . CORONARY STENT INTERVENTION N/A 03/18/2018   Procedure: CORONARY STENT INTERVENTION;  Surgeon: Sherren Mocha, MD;  Location: [facility];  Service: Cardiovascular;  Laterality: N/A;  . EXCISIONAL HEMORRHOIDECTOMY    . I & D KNEE WITH POLY EXCHANGE Left 10/09/2012   Procedure: IRRIGATION AND DEBRIDEMENT LEFT KNEE WITH POLY REVISION;  Surgeon: Gearlean Alf, MD;  Location: [facility];  Service: Orthopedics;  Laterality: Left;  . INGUINAL HERNIA REPAIR Right   . INTRAOPERATIVE TRANSTHORACIC ECHOCARDIOGRAM  03/19/2018   Procedure: Intraoperative Transthoracic Echocardiogram;  Surgeon: Sherren Mocha, MD;  Location: [facility];  Service: Open Heart Surgery;;  . JOINT REPLACEMENT    . KNEE ARTHROTOMY Left 10/09/2012   Procedure: LEFT KNEE ARTHROTOMY;  Surgeon: Gearlean Alf, MD;  Location: [facility];  Service: Orthopedics;  Laterality: Left;  . LEFT HEART CATH AND CORONARY ANGIOGRAPHY N/A 02/28/2018   Procedure: LEFT HEART CATH AND CORONARY ANGIOGRAPHY;  Surgeon: Sherren Mocha, MD;  Location: [facility];  Service: Cardiovascular;  Laterality: N/A;  . LEFT HEART CATH AND CORONARY ANGIOGRAPHY N/A 09/12/2019   Procedure: LEFT HEART CATH AND  CORONARY ANGIOGRAPHY;  Surgeon: Sherren Mocha, MD;  Location: [facility];  Service: Cardiovascular;  Laterality: N/A;  . PROSTATECTOMY  04/25/2004  . REPLACEMENT TOTAL KNEE Bilateral   . SKIN BIOPSY     "off nose; wasn't cancer; it was tested" (03/14/2017)  . TRANSCATHETER AORTIC VALVE REPLACEMENT, TRANSFEMORAL N/A 03/19/2018   Procedure: TRANSCATHETER AORTIC VALVE REPLACEMENT, TRANSFEMORAL;  Surgeon: Sherren Mocha, MD;  Location: [facility];  Service: Open Heart Surgery;  Laterality: N/A;  . Uretheral implants     multiple for incontinence  . VENA CAVA FILTER PLACEMENT      Family History  Problem Relation Age of Onset  . Melanoma Father   . Melanoma Brother     Social History   Tobacco Use  . Smoking status: Former Smoker    Packs/day: 1.00    Years: 10.00    Pack years: 10.00    Types: Cigarettes    Quit date: 04/27/1961  Years since quitting: 59.0  . Smokeless tobacco: Never Used  Vaping Use  . Vaping Use: Never used  Substance Use Topics  . Alcohol use: No    Alcohol/week: 0.0 standard drinks  . Drug use: No    Prior to Admission medications   Medication Sig Start Date End Date Taking? Authorizing Provider  acetaminophen (TYLENOL) 500 MG tablet Take 500 mg by mouth at bedtime.    Yes [provider]  amLODipine (NORVASC) 2.5 MG tablet Take 1 tablet (2.5 mg total) by mouth daily. 09/03/19  Yes Tobb, Kardie, DO  Ascorbic Acid (VITAMIN C) 1000 MG tablet Take 1,000 mg by mouth daily.   Yes [provider]  Biotin 2500 MCG CAPS Take 2,500 mcg by mouth daily.   Yes [provider]  cefpodoxime (VANTIN) 200 MG tablet Take 1 tablet (200 mg total) by mouth 2 (two) times daily. 04/23/20  Yes Casmira Cramer, MD  cholecalciferol (VITAMIN D) 1000 UNITS tablet Take 1,000 Units by mouth daily.   Yes [provider]  docusate sodium (COLACE) 50 MG capsule Take 50 mg by mouth daily.   Yes [provider]  furosemide (LASIX) 20 MG tablet  Take 20 mg by mouth daily.    Yes [provider]  Glucosamine HCl (GLUCOSAMINE PO) Take 2,000 mg by mouth daily.   Yes [provider]  isosorbide mononitrate (IMDUR) 30 MG 24 hr tablet Take 0.5 tablets (15 mg total) by mouth daily. 02/05/20  Yes Richardo Priest, MD  Multiple Vitamin (MULTIVITAMIN) capsule Take 1 capsule by mouth daily.  04/28/19  Yes [provider]  nitroGLYCERIN (NITROSTAT) 0.4 MG SL tablet Place 1 tablet (0.4 mg total) under the tongue every 5 (five) minutes as needed. 07/14/19  Yes Richardo Priest, MD  Omega-3 Fatty Acids (FISH OIL) 1000 MG CAPS Take 1,000 mg by mouth daily.   Yes [provider]  omeprazole (PRILOSEC) 20 MG capsule Take 1 capsule (20 mg total) by mouth daily. 04/21/20  Yes Richardo Priest, MD  pravastatin (PRAVACHOL) 20 MG tablet TAKE ONE TABLET BY MOUTH ONCE DAILY Patient taking differently: Take 20 mg by mouth daily. 01/08/20  Yes Richardo Priest, MD  vitamin B-12 (CYANOCOBALAMIN) 500 MCG tablet Take 500 mcg by mouth daily.   Yes [provider]  warfarin (COUMADIN) 2.5 MG tablet Take 1 tablet daily or as directed by [facility] Patient taking differently: Take 2.5 mg by mouth daily. 04/16/20  Yes Richardo Priest, MD    Allergies Darifenacin, Codeine, Sulfamethoxazole-trimethoprim, and Lisinopril   REVIEW OF SYSTEMS  Negative except as noted here or in the History of Present Illness.   PHYSICAL EXAMINATION  Initial Vital Signs Blood pressure (!) 142/78, pulse 73, temperature (!) 102.7 F (39.3 C), resp. rate 20, height 6' (1.829 m), weight 104.3 kg, SpO2 98 %.  Examination General: Well-developed, well-nourished male in no acute distress; appearance consistent with age of record HENT: normocephalic; atraumatic Eyes: pupils equal, round and reactive to light; extraocular muscles intact; bilateral arcus senilis and pseudophakia Neck: supple Heart: regular rate and rhythm Lungs: clear to auscultation  bilaterally Abdomen: soft; nondistended; nontender; bowel sounds present GU: No CVA tenderness Extremities: No deformity; full range of motion; pulses normal; trace edema of lower legs Neurologic: Awake, alert; motor function intact in all extremities and symmetric; no facial droop; hard of hearing Skin: Warm and dry Psychiatric: Normal mood and affect   RESULTS  Summary of this visit's results, reviewed and  interpreted by myself:   EKG Interpretation  Date/Time:    Ventricular Rate:    PR Interval:    QRS Duration:   QT Interval:    QTC Calculation:   R Axis:     Text Interpretation:        Laboratory Studies: Results for orders placed or performed during the hospital encounter of 04/22/20 (from the past 24 hour(s))  Urinalysis, Routine w reflex microscopic     Status: Abnormal   Collection Time: 04/22/20 10:27 PM  Result Value Ref Range   Color, Urine YELLOW YELLOW   APPearance CLEAR CLEAR   Specific Gravity, Urine 1.025 1.005 - 1.030   pH 5.5 5.0 - 8.0   Glucose, UA NEGATIVE NEGATIVE mg/dL   Hgb urine dipstick MODERATE (A) NEGATIVE   Bilirubin Urine NEGATIVE NEGATIVE   Ketones, ur NEGATIVE NEGATIVE mg/dL   Protein, ur 100 (A) NEGATIVE mg/dL   Nitrite NEGATIVE NEGATIVE   Leukocytes,Ua SMALL (A) NEGATIVE  Urinalysis, Microscopic (reflex)     Status: Abnormal   Collection Time: 04/22/20 10:27 PM  Result Value Ref Range   RBC / HPF 6-10 0 - 5 RBC/hpf   WBC, UA 6-10 0 - 5 WBC/hpf   Bacteria, UA RARE (A) NONE SEEN   Squamous Epithelial / LPF 0-5 0 - 5   Mucus PRESENT   Lactic acid, plasma     Status: None   Collection Time: 04/22/20 10:53 PM  Result Value Ref Range   Lactic Acid, Venous 1.8 0.5 - 1.9 mmol/L  Comprehensive metabolic panel     Status: Abnormal   Collection Time: 04/22/20 10:53 PM  Result Value Ref Range   Sodium 130 (L) 135 - 145 mmol/L   Potassium 4.8 3.5 - 5.1 mmol/L   Chloride 99 98 - 111 mmol/L   CO2 21 (L) 22 - 32 mmol/L   Glucose, Bld 123  (H) 70 - 99 mg/dL   BUN 27 (H) 8 - 23 mg/dL   Creatinine, Ser 1.62 (H) 0.61 - 1.24 mg/dL   Calcium 9.5 8.9 - 10.3 mg/dL   Total Protein 7.1 6.5 - 8.1 g/dL   Albumin 3.2 (L) 3.5 - 5.0 g/dL   AST 29 15 - 41 U/L   ALT 12 0 - 44 U/L   Alkaline Phosphatase 92 38 - 126 U/L   Total Bilirubin 1.6 (H) 0.3 - 1.2 mg/dL   GFR, Estimated 41 (L) >60 mL/min   Anion gap 10 5 - 15  CBC WITH DIFFERENTIAL     Status: Abnormal   Collection Time: 04/22/20 10:53 PM  Result Value Ref Range   WBC 17.0 (H) 4.0 - 10.5 K/uL   RBC 5.82 (H) 4.22 - 5.81 MIL/uL   Hemoglobin 15.2 13.0 - 17.0 g/dL   HCT 47.1 39.0 - 52.0 %   MCV 80.9 80.0 - 100.0 fL   MCH 26.1 26.0 - 34.0 pg   MCHC 32.3 30.0 - 36.0 g/dL   RDW 17.9 (H) 11.5 - 15.5 %   Platelets 290 150 - 400 K/uL   nRBC 0.0 0.0 - 0.2 %   Neutrophils Relative % 88 %   Neutro Abs 15.0 (H) 1.7 - 7.7 K/uL   Lymphocytes Relative 5 %   Lymphs Abs 0.8 0.7 - 4.0 K/uL   Monocytes Relative 6 %   Monocytes Absolute 1.0 0.1 - 1.0 K/uL   Eosinophils Relative 0 %   Eosinophils Absolute 0.0 0.0 - 0.5 K/uL   Basophils Relative 0 %  Basophils Absolute 0.1 0.0 - 0.1 K/uL   Immature Granulocytes 1 %   Abs Immature Granulocytes 0.15 (H) 0.00 - 0.07 K/uL  Protime-INR     Status: Abnormal   Collection Time: 04/22/20 10:53 PM  Result Value Ref Range   Prothrombin Time 28.0 (H) 11.4 - 15.2 seconds   INR 2.7 (H) 0.8 - 1.2  APTT     Status: Abnormal   Collection Time: 04/22/20 10:53 PM  Result Value Ref Range   aPTT 43 (H) 24 - 36 seconds  Lactic acid, plasma     Status: None   Collection Time: 04/23/20 12:27 AM  Result Value Ref Range   Lactic Acid, Venous 1.8 0.5 - 1.9 mmol/L   Imaging Studies: DG Chest Port 1 View  Result Date: 04/22/2020 CLINICAL DATA:  Fever, sepsis EXAM: PORTABLE CHEST 1 VIEW COMPARISON:  08/24/2019 FINDINGS: Single frontal view of the chest demonstrates a stable cardiac silhouette. Multi lead pacer and aortic valve prosthesis unchanged. No airspace  disease, effusion, or pneumothorax. IMPRESSION: 1. No acute intrathoracic process.  Stable exam. Electronically Signed   By: Randa Ngo M.D.   On: 04/22/2020 22:42   CT Renal Stone Study  Result Date: 04/22/2020 CLINICAL DATA:  87 year old with flank pain. Fever. Patient reports recently passed kidney stone. EXAM: CT ABDOMEN AND PELVIS WITHOUT CONTRAST TECHNIQUE: Multidetector CT imaging of the abdomen and pelvis was performed following the standard protocol without IV contrast. COMPARISON:  CT 03/27/2018 FINDINGS: Lower chest: Cardiomegaly. Pacemaker partially included. Prostatic aortic valve. Coronary artery calcifications. Minimal subpleural reticulation at the lung bases. No pleural fluid. Hepatobiliary: No focal hepatic abnormality on noncontrast exam. Intraluminal gallstones without pericholecystic fat stranding. Mild biliary prominence is likely normal for age. Pancreas: Mild fatty atrophy of the head and uncinate process. No ductal dilatation or inflammation. Spleen: Normal in size without focal abnormality. Adrenals/Urinary Tract: No adrenal nodule. Moderate to severe right perinephric edema. There is stranding in the region of the renal pelvis. No definite hydronephrosis. There is thinning of the right renal parenchyma with upper pole cyst. Nonobstructing stone measuring 17 mm in the lower right kidney. There are additional punctate nonobstructing stones in the mid kidney versus vascular calcifications. Small subcapsular collection about the anterior mid kidney, series 2, image 37, measures approximately 12 mm in thickness. There is no intrarenal renal collecting system air. The right ureter is decompressed, no ureteral stone. Stranding extends from the renal pelvis to the proximal ureter. There is no left hydronephrosis. No left renal calculi. Small left renal cysts. Minimal left perinephric edema. Left ureter is decompressed without stone along the course. Urinary bladder is completely empty and  not well assessed. There is likely mild perivesicular fat stranding. Stomach/Bowel: Decompressed stomach. No small bowel obstruction or inflammation. Mild fecalization of small bowel contents. Appendix not visualized, appendectomy per history. Moderate colonic stool burden. There is colonic redundancy. No colonic wall thickening or inflammation. No significant diverticular disease. Vascular/Lymphatic: Advanced aortic atherosclerosis. No aortic aneurysm. Infrarenal IVC filter in place. 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 Small retroperitoneal nodes are not enlarged by size criteria. No pelvic adenopathy. Reproductive: Prostatectomy. Other: Small amount of free fluid in the pelvis. No upper abdominal ascites. No free air. Fat in both inguinal canals. Musculoskeletal: Scoliosis and advanced degenerative change in the spine. There are no acute or suspicious osseous abnormalities. IMPRESSION: 1. Moderate to severe right perinephric edema. Stranding in the region of the renal pelvis and proximal ureter. No frank hydronephrosis. There are nonobstructing stones in the  right kidney but no obstructing or ureteral calculi. Findings may be due to recently passed stone or urinary tract infection. 2. Small subcapsular collection about the anterior mid right kidney measuring up to 12 mm in thickness. This may represent a subcapsular hematoma, however sterility is indeterminate. 3. Cholelithiasis without gallbladder inflammation. Aortic Atherosclerosis (ICD10-I70.0). Electronically Signed   By: Keith Rake M.D.   On: 04/22/2020 23:50    ED COURSE and MDM  Nursing notes, initial and subsequent vitals signs, including pulse oximetry, reviewed and interpreted by myself.  Vitals:   04/22/20 2233 04/22/20 2356 04/23/20 0157 04/23/20 0250  BP: (!) 142/78 133/66 135/73 129/70  Pulse: 73 66 (!) 59 61  Resp: 20 (!) 32 18 18  Temp: (!) 102.7 F (39.3 C)  99.2 F (37.3 C)   TempSrc:      SpO2: 98% 98% 98% 95%  Weight:       Height:       Medications  acetaminophen (TYLENOL) tablet 650 mg (650 mg Oral Given 04/23/20 0021)  cefTRIAXone (ROCEPHIN) 1 g in sodium chloride 0.9 % 100 mL IVPB (1 g Intravenous New Bag/Given 04/23/20 0248)   3:43 AM The patient's history and CT findings are most consistent with recently passed stone, especially since he is no longer having any pain.  His urinalysis suggest a mild urinary tract infection.  Since he has been on Bactrim without complete resolution we will switch to a different antibiotic.  Urine has been sent for culture.   PROCEDURES  Procedures   ED DIAGNOSES     ICD-10-CM   1. Urinary tract infection with hematuria, site unspecified  N39.0    R31.9   2. Ureteral colic  F74        Lakindra Wible, MD 04/23/20 [phone]

## 2020-04-23 DIAGNOSIS — C61 Malignant neoplasm of prostate: Secondary | ICD-10-CM | POA: Diagnosis not present

## 2020-04-23 DIAGNOSIS — N183 Chronic kidney disease, stage 3 unspecified: Secondary | ICD-10-CM | POA: Diagnosis not present

## 2020-04-23 DIAGNOSIS — M1990 Unspecified osteoarthritis, unspecified site: Secondary | ICD-10-CM | POA: Diagnosis not present

## 2020-04-23 DIAGNOSIS — E7800 Pure hypercholesterolemia, unspecified: Secondary | ICD-10-CM | POA: Diagnosis not present

## 2020-04-23 DIAGNOSIS — I4891 Unspecified atrial fibrillation: Secondary | ICD-10-CM | POA: Diagnosis not present

## 2020-04-23 DIAGNOSIS — Z8546 Personal history of malignant neoplasm of prostate: Secondary | ICD-10-CM | POA: Diagnosis not present

## 2020-04-23 DIAGNOSIS — I2510 Atherosclerotic heart disease of native coronary artery without angina pectoris: Secondary | ICD-10-CM | POA: Diagnosis not present

## 2020-04-23 DIAGNOSIS — K219 Gastro-esophageal reflux disease without esophagitis: Secondary | ICD-10-CM | POA: Diagnosis not present

## 2020-04-23 DIAGNOSIS — N390 Urinary tract infection, site not specified: Secondary | ICD-10-CM | POA: Diagnosis not present

## 2020-04-23 DIAGNOSIS — E038 Other specified hypothyroidism: Secondary | ICD-10-CM | POA: Diagnosis not present

## 2020-04-23 DIAGNOSIS — I10 Essential (primary) hypertension: Secondary | ICD-10-CM | POA: Diagnosis not present

## 2020-04-23 DIAGNOSIS — E785 Hyperlipidemia, unspecified: Secondary | ICD-10-CM | POA: Diagnosis not present

## 2020-04-23 LAB — LACTIC ACID, PLASMA: Lactic Acid, Venous: 1.8 mmol/L (ref 0.5–1.9)

## 2020-04-23 LAB — URINALYSIS, ROUTINE W REFLEX MICROSCOPIC
Bilirubin Urine: NEGATIVE
Glucose, UA: NEGATIVE mg/dL
Ketones, ur: NEGATIVE mg/dL
Nitrite: NEGATIVE
Protein, ur: 100 mg/dL — AB
Specific Gravity, Urine: 1.025 (ref 1.005–1.030)
pH: 5.5 (ref 5.0–8.0)

## 2020-04-23 LAB — URINALYSIS, MICROSCOPIC (REFLEX)

## 2020-04-23 MED ORDER — CEFPODOXIME PROXETIL 200 MG PO TABS: 200.0000 mg | ORAL_TABLET | Freq: Two times a day (BID) | ORAL | 0 refills | Status: DC

## 2020-04-23 MED ORDER — SODIUM CHLORIDE 0.9 % IV SOLN
1.0000 g | Freq: Once | INTRAVENOUS | Status: AC
  Administered 2020-04-23: 1 g via INTRAVENOUS
  Filled 2020-04-23: qty 10

## 2020-04-24 LAB — URINE CULTURE: Culture: NO GROWTH

## 2020-04-27 ENCOUNTER — Emergency Department (HOSPITAL_COMMUNITY)
Admission: EM | Admit: 2020-04-27 | Discharge: 2020-04-27 | Disposition: A | Discharge status: Home / Self Care | Payer: Medicare Other | Attending: Emergency Medicine | Admitting: Emergency Medicine

## 2020-04-27 ENCOUNTER — Other Ambulatory Visit: Payer: Self-pay

## 2020-04-27 ENCOUNTER — Encounter (HOSPITAL_COMMUNITY): Payer: Self-pay | Admitting: Emergency Medicine

## 2020-04-27 DIAGNOSIS — Z7901 Long term (current) use of anticoagulants: Secondary | ICD-10-CM | POA: Diagnosis not present

## 2020-04-27 DIAGNOSIS — I2510 Atherosclerotic heart disease of native coronary artery without angina pectoris: Secondary | ICD-10-CM | POA: Diagnosis not present

## 2020-04-27 DIAGNOSIS — I4891 Unspecified atrial fibrillation: Secondary | ICD-10-CM | POA: Insufficient documentation

## 2020-04-27 DIAGNOSIS — I5042 Chronic combined systolic (congestive) and diastolic (congestive) heart failure: Secondary | ICD-10-CM | POA: Diagnosis not present

## 2020-04-27 DIAGNOSIS — Z79899 Other long term (current) drug therapy: Secondary | ICD-10-CM | POA: Diagnosis not present

## 2020-04-27 DIAGNOSIS — Z8546 Personal history of malignant neoplasm of prostate: Secondary | ICD-10-CM | POA: Insufficient documentation

## 2020-04-27 DIAGNOSIS — R410 Disorientation, unspecified: Secondary | ICD-10-CM

## 2020-04-27 DIAGNOSIS — N183 Chronic kidney disease, stage 3 unspecified: Secondary | ICD-10-CM | POA: Diagnosis not present

## 2020-04-27 DIAGNOSIS — Z87891 Personal history of nicotine dependence: Secondary | ICD-10-CM | POA: Insufficient documentation

## 2020-04-27 DIAGNOSIS — Z96653 Presence of artificial knee joint, bilateral: Secondary | ICD-10-CM | POA: Diagnosis not present

## 2020-04-27 DIAGNOSIS — Z950 Presence of cardiac pacemaker: Secondary | ICD-10-CM | POA: Diagnosis not present

## 2020-04-27 DIAGNOSIS — R109 Unspecified abdominal pain: Secondary | ICD-10-CM | POA: Diagnosis not present

## 2020-04-27 DIAGNOSIS — N390 Urinary tract infection, site not specified: Secondary | ICD-10-CM

## 2020-04-27 DIAGNOSIS — J4540 Moderate persistent asthma, uncomplicated: Secondary | ICD-10-CM | POA: Diagnosis not present

## 2020-04-27 DIAGNOSIS — I130 Hypertensive heart and chronic kidney disease with heart failure and stage 1 through stage 4 chronic kidney disease, or unspecified chronic kidney disease: Secondary | ICD-10-CM | POA: Insufficient documentation

## 2020-04-27 LAB — CBC WITH DIFFERENTIAL/PLATELET
Abs Immature Granulocytes: 0.09 10*3/uL — ABNORMAL HIGH (ref 0.00–0.07)
Basophils Absolute: 0 10*3/uL (ref 0.0–0.1)
Basophils Relative: 0 %
Eosinophils Absolute: 0.1 10*3/uL (ref 0.0–0.5)
Eosinophils Relative: 1 %
HCT: 49.4 % (ref 39.0–52.0)
Hemoglobin: 16.2 g/dL (ref 13.0–17.0)
Immature Granulocytes: 1 %
Lymphocytes Relative: 6 %
Lymphs Abs: 0.7 10*3/uL (ref 0.7–4.0)
MCH: 25.6 pg — ABNORMAL LOW (ref 26.0–34.0)
MCHC: 32.8 g/dL (ref 30.0–36.0)
MCV: 78.2 fL — ABNORMAL LOW (ref 80.0–100.0)
Monocytes Absolute: 0.8 10*3/uL (ref 0.1–1.0)
Monocytes Relative: 7 %
Neutro Abs: 9.2 10*3/uL — ABNORMAL HIGH (ref 1.7–7.7)
Neutrophils Relative %: 85 %
Platelets: 308 10*3/uL (ref 150–400)
RBC: 6.32 MIL/uL — ABNORMAL HIGH (ref 4.22–5.81)
RDW: 17.9 % — ABNORMAL HIGH (ref 11.5–15.5)
WBC: 10.8 10*3/uL — ABNORMAL HIGH (ref 4.0–10.5)
nRBC: 0 % (ref 0.0–0.2)

## 2020-04-27 LAB — URINALYSIS, ROUTINE W REFLEX MICROSCOPIC
Bilirubin Urine: NEGATIVE
Glucose, UA: NEGATIVE mg/dL
Hgb urine dipstick: NEGATIVE
Ketones, ur: 20 mg/dL — AB
Leukocytes,Ua: NEGATIVE
Nitrite: NEGATIVE
Protein, ur: 30 mg/dL — AB
Specific Gravity, Urine: 1.021 (ref 1.005–1.030)
pH: 5 (ref 5.0–8.0)

## 2020-04-27 LAB — COMPREHENSIVE METABOLIC PANEL
ALT: 19 U/L (ref 0–44)
AST: 54 U/L — ABNORMAL HIGH (ref 15–41)
Albumin: 3.1 g/dL — ABNORMAL LOW (ref 3.5–5.0)
Alkaline Phosphatase: 103 U/L (ref 38–126)
Anion gap: 13 (ref 5–15)
BUN: 26 mg/dL — ABNORMAL HIGH (ref 8–23)
CO2: 22 mmol/L (ref 22–32)
Calcium: 10.3 mg/dL (ref 8.9–10.3)
Chloride: 98 mmol/L (ref 98–111)
Creatinine, Ser: 1.36 mg/dL — ABNORMAL HIGH (ref 0.61–1.24)
GFR, Estimated: 50 mL/min — ABNORMAL LOW (ref >60)
Glucose, Bld: 110 mg/dL — ABNORMAL HIGH (ref 70–99)
Potassium: 4.2 mmol/L (ref 3.5–5.1)
Sodium: 133 mmol/L — ABNORMAL LOW (ref 135–145)
Total Bilirubin: 1.3 mg/dL — ABNORMAL HIGH (ref 0.3–1.2)
Total Protein: 7.5 g/dL (ref 6.5–8.1)

## 2020-04-27 LAB — LACTIC ACID, PLASMA: Lactic Acid, Venous: 1.8 mmol/L (ref 0.5–1.9)

## 2020-04-27 LAB — PROTIME-INR
INR: 3.7 — ABNORMAL HIGH (ref 0.8–1.2)
Prothrombin Time: 35.2 seconds — ABNORMAL HIGH (ref 11.4–15.2)

## 2020-04-27 NOTE — ED Triage Notes (Signed)
Pt BIB EMS coming from home with c/o pain, Per EMS pt was just seen and treated for kidney stones 3 days ago.  Pt was given antibiotics. Pt is on day 3 and reports pain has gotten better, but neighbor reports he is not. PCP was called and pt was informed to return to the ED for re-evaluation.  VS- BP- 138/64 HR-70 98% RA 97.9  A&O x 4

## 2020-04-27 NOTE — ED Notes (Signed)
Family at bedside. 

## 2020-04-27 NOTE — ED Provider Notes (Signed)
[facility] DEPT Provider Note   CSN: [id number] Arrival date & time: 04/27/20  1048     History Chief Complaint  Patient presents with  . Flank Pain    [name] is a 87 y.o. male.  HPI Patient presents via EMS with concern for flank pain.  Patient is elderly, but cannot provide his own details.  Patient seemingly was treated for urinary tract infection 5 days ago after initially being evaluated concern of flank pain, consideration of kidney stones. After the initial evaluation patient's wife joins him.  She adds additional details.  Seems of the patient is generally healthy.  About 1 week ago, patient was seen and evaluated in urgent care, diagnosed with urinary tract infection, started on Bactrim.  At that point he had a known kidney stone that was demonstrated last fall on imaging studies.  Soon after the his urgent care visit the patient had nausea, weakness.  In discussion with his primary care physician antibiotics were stopped.  5 days you the patient was seen and evaluated here due to flank pain, fever, weakness.  He was diagnosed with urinary tract infection, imaging studies did not demonstrate descending kidney stone, other suspicion for pyelonephritis.  He has been taking his antibiotics as instructed.  Wife notes that he has been weaker, more confused, with ongoing pain since that evaluation.  However, today the patient is generally better.  However, with persistent symptoms he was sent here for evaluation. The patient self corroborates this, states that his pain is improved, he feels better.  Seemingly his greatest concern is that he has ongoing hiccups.     Past Medical History:  Diagnosis Date  . Aortic valve stenosis 04/05/2015   Formatting of this note might be different from the original. Overview:  ECHO 5/15 Formatting of this note might be different from the original. Overview:  Overview:  ECHO 5/15  . Arthritis    "knees, right  shoulder" (03/14/2017)  . Atrial fibrillation, persistent ([place]) 09/19/2014  . CAD (coronary artery disease), native coronary artery    Cath 2011 [facility] (08/2009):~ Proximal LAD 30%, mid to distal LAD 25%, ostial small D1 75% mid AV groove circumflex 99% been subtotal stenosis, proximal to mid RCA 25-30%, mid RCA 30%, mid PDA 30%, EF 50% with inferior hypokinesis.;    July 2011  PCI and DES to circumflex Dr. Olevia Perches   ETT-Myoview (06/2013):  Inferolateral scar, mild peri-infarct ischemia, EF 40%; Intermediate Risk   ECHO EF 55% 03/2013   . CAD (coronary artery disease), native coronary artery 04/05/2015   Cath 2011 [facility] (08/2009):~ Proximal LAD 30%, mid to distal LAD 25%, ostial small D1 75% mid AV groove circumflex 99% been subtotal stenosis, proximal to mid RCA 25-30%, mid RCA 30%, mid PDA 30%, EF 50% with inferior hypokinesis.;    July 2011  PCI and DES to circumflex Dr. Olevia Perches   ETT-Myoview (06/2013):  Inferolateral scar, mild peri-infarct ischemia, EF 40%; Intermediate Risk   ECHO EF 55% 03/2013    . Cardiac pacemaker in situ 03/14/2017   Biventricular St. Jude inserted 03/14/17 Dr. Lovena Le for second degree heart block   . Chronic combined systolic and diastolic heart failure ([place]) 11/19/2017  . CKD (chronic kidney disease), stage III ([place])   . Gastro-esophageal reflux disease without esophagitis 09/24/2009  . GERD   . History of infection of prosthetic knee 04/05/2015   Treated with debridement and irrigation followed by 6 months of triple antibiotics  . History of  kidney stones   . History of peptic ulcer 1970s?  Marland Kitchen History of prostate cancer    Radical prostatectomy in 2006 Dr. Terance Hart   . Hypertensive heart disease   . Internal hemorrhoids 09/11/2018  . Long term (current) use of anticoagulants 10/06/2011  . Mixed hyperlipidemia   . Moderate persistent asthma without complication 9/50/9326  . Noise effect on both inner ears 08/14/2018  . Obesity (BMI 30-39.9)   . OSA on CPAP   . OSA treated with BiPAP  11/15/2015  . Persistent atrial fibrillation ([place]) 09/19/2014   CHA2DS2VASC score 5  Cardioversion 10/08/2014 was on amiodarone until 2018   . 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)    Initial DVT in 2006 after prostate surgery and had [place] filter placed Bilateral  PE 2013 and placed back on warfarin DVT of right subclavian vein on doppler 10/2012 at time of knee infection   . Personal history of malignant neoplasm of prostate    Radical prostatectomy in 2006 Dr. Terance Hart    . Presbycusis of both ears 08/14/2018  . Severe aortic stenosis   . Severe aortic stenosis 03/18/2018  . Urinary incontinence    MULTIPLE BLADDER SURGERIES - STATES NO URINARY SPHINCTER - PT'S UROLOGIST IS AT [facility]- DR. PETERSON  ( LAST VISIT WAS 09/15/11 )    Patient Active Problem List   Diagnosis Date Noted  . Severe aortic stenosis   . OSA on CPAP   . History of prostate cancer   . History of peptic ulcer   . History of kidney stones   . GERD   . CAD (coronary artery disease), native coronary artery   . Arthritis   . Internal hemorrhoids 09/11/2018  . Noise effect on both inner ears 08/14/2018  . Presbycusis of both ears 08/14/2018  . 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 CKD (chronic kidney disease), stage III ([place])   . Chronic combined systolic and diastolic heart failure ([place]) 11/19/2017  . Cardiac pacemaker in situ 03/14/2017  . 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) 01/24/2016  . OSA treated with BiPAP 11/15/2015  . Personal history of malignant neoplasm of prostate 04/05/2015  . Hypertensive heart disease 04/05/2015  . 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[place]) 04/05/2015  . CAD (coronary artery disease), native coronary artery 04/05/2015  . Aortic valve stenosis 04/05/2015  . Persistent atrial fibrillation ([place]) 09/19/2014  . Moderate persistent asthma without complication 71/24/5809  . Atrial fibrillation, persistent  ([place]) 09/19/2014  . Angina pectoris ([place]) 09/18/2014  . Urinary incontinence 03/04/2014  . Obesity (BMI 30-39.9)   . Long term (current) use of anticoagulants 10/06/2011  . Mixed hyperlipidemia 02/24/2010  . Gastro-esophageal reflux disease without esophagitis 09/24/2009    Past Surgical History:  Procedure Laterality Date  . APPENDECTOMY    . BI-VENTRICULAR PACEMAKER INSERTION (CRT-P)  03/14/2017  . BIV PACEMAKER INSERTION CRT-P N/A 03/14/2017   Procedure: BIV PACEMAKER INSERTION CRT-P;  Surgeon: Evans Lance, MD;  Location: [facility];  Service: Cardiovascular;  Laterality: N/A;  . CARDIOVERSION N/A 10/08/2014   Procedure: CARDIOVERSION;  Surgeon: Jacolyn Reedy, MD;  Location: [facility];  Service: Cardiovascular;  Laterality: N/A;  . CATARACT EXTRACTION W/ INTRAOCULAR LENS  IMPLANT, BILATERAL Bilateral   . CORONARY ANGIOPLASTY WITH STENT PLACEMENT  08/25/2009   DES-mid LCx 08/2009; 30% pLAD, 25% m/dLAD, 75% ostial D1, 99% mLCx s/p DES, 25-30% p/mRCA, 40% mRCA, 30% mPDA stenoses; LVEF 50%,  inf hypokinesis  . CORONARY STENT INTERVENTION N/A 03/18/2018   Procedure: CORONARY STENT INTERVENTION;  Surgeon: Sherren Mocha, MD;  Location: [facility];  Service: Cardiovascular;  Laterality: N/A;  . EXCISIONAL HEMORRHOIDECTOMY    . I & D KNEE WITH POLY EXCHANGE Left 10/09/2012   Procedure: IRRIGATION AND DEBRIDEMENT LEFT KNEE WITH POLY REVISION;  Surgeon: Gearlean Alf, MD;  Location: [facility];  Service: Orthopedics;  Laterality: Left;  . INGUINAL HERNIA REPAIR Right   . INTRAOPERATIVE TRANSTHORACIC ECHOCARDIOGRAM  03/19/2018   Procedure: Intraoperative Transthoracic Echocardiogram;  Surgeon: Sherren Mocha, MD;  Location: [facility];  Service: Open Heart Surgery;;  . JOINT REPLACEMENT    . KNEE ARTHROTOMY Left 10/09/2012   Procedure: LEFT KNEE ARTHROTOMY;  Surgeon: Gearlean Alf, MD;  Location: [facility];  Service: Orthopedics;  Laterality: Left;  . LEFT HEART CATH AND CORONARY  ANGIOGRAPHY N/A 02/28/2018   Procedure: LEFT HEART CATH AND CORONARY ANGIOGRAPHY;  Surgeon: Sherren Mocha, MD;  Location: [facility];  Service: Cardiovascular;  Laterality: N/A;  . LEFT HEART CATH AND CORONARY ANGIOGRAPHY N/A 09/12/2019   Procedure: LEFT HEART CATH AND CORONARY ANGIOGRAPHY;  Surgeon: Sherren Mocha, MD;  Location: [facility];  Service: Cardiovascular;  Laterality: N/A;  . PROSTATECTOMY  04/25/2004  . REPLACEMENT TOTAL KNEE Bilateral   . SKIN BIOPSY     "off nose; wasn't cancer; it was tested" (03/14/2017)  . TRANSCATHETER AORTIC VALVE REPLACEMENT, TRANSFEMORAL N/A 03/19/2018   Procedure: TRANSCATHETER AORTIC VALVE REPLACEMENT, TRANSFEMORAL;  Surgeon: Sherren Mocha, MD;  Location: [facility];  Service: Open Heart Surgery;  Laterality: N/A;  . Uretheral implants     multiple for incontinence  . VENA CAVA FILTER PLACEMENT         Family History  Problem Relation Age of Onset  . Melanoma Father   . Melanoma Brother     Social History   Tobacco Use  . Smoking status: Former Smoker    Packs/day: 1.00    Years: 10.00    Pack years: 10.00    Types: Cigarettes    Quit date: 04/27/1961    Years since quitting: 59.0  . Smokeless tobacco: Never Used  Vaping Use  . Vaping Use: Never used  Substance Use Topics  . Alcohol use: No    Alcohol/week: 0.0 standard drinks  . Drug use: No    Home Medications Prior to Admission medications   Medication Sig Start Date End Date Taking? Authorizing Provider  acetaminophen (TYLENOL) 500 MG tablet Take 500 mg by mouth at bedtime.     [provider]  amLODipine (NORVASC) 2.5 MG tablet Take 1 tablet (2.5 mg total) by mouth daily. 09/03/19   Tobb, Kardie, DO  Ascorbic Acid (VITAMIN C) 1000 MG tablet Take 1,000 mg by mouth daily.    [provider]  Biotin 2500 MCG CAPS Take 2,500 mcg by mouth daily.    [provider]  cefpodoxime (VANTIN) 200 MG tablet Take 1 tablet (200 mg total) by mouth 2  (two) times daily. 04/23/20   Molpus, John, MD  cholecalciferol (VITAMIN D) 1000 UNITS tablet Take 1,000 Units by mouth daily.    [provider]  docusate sodium (COLACE) 50 MG capsule Take 50 mg by mouth daily.    [provider]  furosemide (LASIX) 20 MG tablet Take 20 mg by mouth daily.     [provider]  Glucosamine HCl (GLUCOSAMINE PO) Take 2,000 mg by mouth daily.  [provider]  isosorbide mononitrate (IMDUR) 30 MG 24 hr tablet Take 0.5 tablets (15 mg total) by mouth daily. 02/05/20   Richardo Priest, MD  Multiple Vitamin (MULTIVITAMIN) capsule Take 1 capsule by mouth daily.  04/28/19   [provider]  nitroGLYCERIN (NITROSTAT) 0.4 MG SL tablet Place 1 tablet (0.4 mg total) under the tongue every 5 (five) minutes as needed. 07/14/19   Richardo Priest, MD  Omega-3 Fatty Acids (FISH OIL) 1000 MG CAPS Take 1,000 mg by mouth daily.    [provider]  omeprazole (PRILOSEC) 20 MG capsule Take 1 capsule (20 mg total) by mouth daily. 04/21/20   Richardo Priest, MD  pravastatin (PRAVACHOL) 20 MG tablet TAKE ONE TABLET BY MOUTH ONCE DAILY Patient taking differently: Take 20 mg by mouth daily. 01/08/20   Richardo Priest, MD  vitamin B-12 (CYANOCOBALAMIN) 500 MCG tablet Take 500 mcg by mouth daily.    [provider]  warfarin (COUMADIN) 2.5 MG tablet Take 1 tablet daily or as directed by Coumadin Clinic Patient taking differently: Take 2.5 mg by mouth daily. 04/16/20   Richardo Priest, MD    Allergies    Darifenacin, Codeine, Sulfamethoxazole-trimethoprim, and Lisinopril  Review of Systems   Review of Systems  Constitutional:       Per HPI, otherwise negative  HENT:       Per HPI, otherwise negative  Respiratory:       Per HPI, otherwise negative  Cardiovascular:       Per HPI, otherwise negative  Gastrointestinal: Negative for vomiting.  Endocrine:       Negative aside from HPI  Genitourinary:       Neg aside from HPI    Musculoskeletal:       Per HPI, otherwise negative  Skin: Negative.   Neurological: Negative for syncope.    Physical Exam Updated Vital Signs BP (!) 158/77   Pulse 62   Temp 98.1 F (36.7 C)   Resp 18   Ht 6' (1.829 m)   Wt 104.3 kg   SpO2 92%   BMI 31.19 kg/m   Physical Exam Vitals and nursing note reviewed.  Constitutional:      General: He is not in acute distress.    Appearance: He is well-developed.  HENT:     Head: Normocephalic and atraumatic.  Eyes:     Conjunctiva/sclera: Conjunctivae normal.  Cardiovascular:     Rate and Rhythm: Normal rate and regular rhythm.  Pulmonary:     Effort: Pulmonary effort is normal. No respiratory distress.     Breath sounds: No stridor.  Abdominal:     General: There is no distension.     Tenderness: There is no abdominal tenderness. There is no guarding.  Skin:    General: Skin is warm and dry.  Neurological:     Mental Status: He is alert and oriented to person, place, and time.     ED Results / Procedures / Treatments   Labs (all labs ordered are listed, but only abnormal results are displayed) Labs Reviewed  COMPREHENSIVE METABOLIC PANEL - Abnormal; Notable for the following components:      Result Value   Sodium 133 (*)    Glucose, Bld 110 (*)    BUN 26 (*)    Creatinine, Ser 1.36 (*)    Albumin 3.1 (*)    AST 54 (*)    Total Bilirubin 1.3 (*)    GFR, Estimated 50 (*)  All other components within normal limits  CBC WITH DIFFERENTIAL/PLATELET - Abnormal; Notable for the following components:   WBC 10.8 (*)    RBC 6.32 (*)    MCV 78.2 (*)    MCH 25.6 (*)    RDW 17.9 (*)    Neutro Abs 9.2 (*)    Abs Immature Granulocytes 0.09 (*)    All other components within normal limits  URINALYSIS, ROUTINE W REFLEX MICROSCOPIC - Abnormal; Notable for the following components:   Ketones, ur 20 (*)    Protein, ur 30 (*)    Bacteria, UA RARE (*)    All other components within normal limits  PROTIME-INR - Abnormal;  Notable for the following components:   Prothrombin Time 35.2 (*)    INR 3.7 (*)    All other components within normal limits  URINE CULTURE  LACTIC ACID, PLASMA  LACTIC ACID, PLASMA    EKG None  Radiology CT imaging from last visit IMPRESSION: 1. Moderate to severe right perinephric edema. Stranding in the region of the renal pelvis and proximal ureter. No frank hydronephrosis. There are nonobstructing stones in the right kidney but no obstructing or ureteral calculi. Findings may be due to recently passed stone or urinary tract infection. 2. Small subcapsular collection about the anterior mid right kidney measuring up to 12 mm in thickness. This may represent a subcapsular hematoma, however sterility is indeterminate. 3. Cholelithiasis without gallbladder inflammation.   Aortic Atherosclerosis (ICD10-I70.0).     Electronically Signed   By: Keith Rake M.D.   On: 04/22/2020 23:50  Procedures Procedures   Medications Ordered in ED Medications - No data to display  ED Course  I have reviewed the triage vital signs and the nursing notes.  Pertinent labs & imaging results that were available during my care of the patient were reviewed by me and considered in my medical decision making (see chart for details).   After initial evaluation with consideration of stone versus urinary tract infection versus other infectious pathology, patient had labs, urinalysis, x-ray ordered.  He is placed on continuous monitoring, pulse oximetry. Pulse oximetry 99% room air normal   3:53 PM Patient in no distress, sitting upright, speaking clearly. He, his wife and I discussed all findings, and I demonstrated the patient's recent CT imaging to her, and we discussed trends of lab values including generally reassuring findings today with diminished leukocytosis, improved renal function, no substantial electrolyte abnormalities. Absent fever, with his endorsement of improvement, there is  suspicion with the patient is on appropriate antibiotics for his infection.  Urinalysis here reassuring as well. Given these findings, no negation for additional advanced imaging, and after a conversation on continuing appropriate therapy, holding the patient's tramadol, using Tylenol instead, for suspicion is contributing to his confusion, the patient was discharged in stable condition. MDM Rules/Calculators/A&P MDM Number of Diagnoses or Management Options Confusion: new, needed workup Lower urinary tract infectious disease: new, needed workup   Amount and/or Complexity of Data Reviewed Clinical lab tests: reviewed Tests in the radiology section of CPT: reviewed Tests in the medicine section of CPT: reviewed Decide to obtain previous medical records or to obtain history from someone other than the patient: yes Obtain history from someone other than the patient: yes Review and summarize past medical records: yes Independent visualization of images, tracings, or specimens: yes  Risk of Complications, Morbidity, and/or Mortality Presenting problems: high Diagnostic procedures: high Management options: high  Critical Care Total time providing critical care: < 30  minutes  Patient Progress Patient progress: stable  Final Clinical Impression(s) / ED Diagnoses Final diagnoses:  Lower urinary tract infectious disease  Confusion     Carmin Muskrat, MD 04/27/20 1555

## 2020-04-27 NOTE — Discharge Instructions (Addendum)
As discussed, your evaluation today has been largely reassuring.  But, it is important that you monitor your condition carefully, and do not hesitate to return to the ED if you develop new, or concerning changes in your condition.  Please use Tylenol for pain control, stop using tramadol.  Otherwise, please follow-up with your physician for appropriate ongoing care.

## 2020-04-28 LAB — URINE CULTURE: Culture: NO GROWTH

## 2020-04-28 LAB — CULTURE, BLOOD (ROUTINE X 2)
Culture: NO GROWTH
Culture: NO GROWTH
Special Requests: ADEQUATE

## 2020-05-06 DIAGNOSIS — E7800 Pure hypercholesterolemia, unspecified: Secondary | ICD-10-CM | POA: Diagnosis not present

## 2020-05-06 DIAGNOSIS — Z7901 Long term (current) use of anticoagulants: Secondary | ICD-10-CM | POA: Diagnosis not present

## 2020-05-06 DIAGNOSIS — N183 Chronic kidney disease, stage 3 unspecified: Secondary | ICD-10-CM | POA: Diagnosis not present

## 2020-05-06 DIAGNOSIS — Z8546 Personal history of malignant neoplasm of prostate: Secondary | ICD-10-CM | POA: Diagnosis not present

## 2020-05-06 DIAGNOSIS — N3000 Acute cystitis without hematuria: Secondary | ICD-10-CM | POA: Diagnosis not present

## 2020-05-06 DIAGNOSIS — R5383 Other fatigue: Secondary | ICD-10-CM | POA: Diagnosis not present

## 2020-05-06 DIAGNOSIS — I4891 Unspecified atrial fibrillation: Secondary | ICD-10-CM | POA: Diagnosis not present

## 2020-05-06 DIAGNOSIS — I10 Essential (primary) hypertension: Secondary | ICD-10-CM | POA: Diagnosis not present

## 2020-05-07 ENCOUNTER — Telehealth: Payer: Self-pay | Admitting: Pharmacist

## 2020-05-07 NOTE — Telephone Encounter (Signed)
Patient called, reported he felt sick and went to PCP office yesterday.  While there they drew his INR and it was 9.9.  Provider is having him hold coumadin and eat leafy greens.  Has recheck with PCP on Monday and will repeat INR.  Requests appt for Thursday be canceled.

## 2020-05-10 DIAGNOSIS — Z7901 Long term (current) use of anticoagulants: Secondary | ICD-10-CM | POA: Diagnosis not present

## 2020-05-11 ENCOUNTER — Telehealth: Payer: Self-pay

## 2020-05-11 NOTE — Telephone Encounter (Signed)
Pt called into clinic, states he went to PCP yesterday 05/10/20 INR 6.1, they advised him to hold Warfarin Monday and Tuesday and come back for recheck on Wednesday 05/12/20.  Pt states he does not wish to go back to PCP for INR check, he is requesting an appt with our [facility]madin Clinic.  Pt states he was stuck 4 times before they could get a POC INR.  Gave pt appt on 05/12/20 at 9:30am, advised pt to continue holding Warfarin as advised by PCP until recheck.

## 2020-05-12 ENCOUNTER — Ambulatory Visit (INDEPENDENT_AMBULATORY_CARE_PROVIDER_SITE_OTHER): Payer: Medicare Other | Admitting: *Deleted

## 2020-05-12 ENCOUNTER — Other Ambulatory Visit: Payer: Self-pay

## 2020-05-12 DIAGNOSIS — Z5181 Encounter for therapeutic drug level monitoring: Secondary | ICD-10-CM | POA: Diagnosis not present

## 2020-05-12 DIAGNOSIS — I4819 Other persistent atrial fibrillation: Secondary | ICD-10-CM | POA: Diagnosis not present

## 2020-05-12 DIAGNOSIS — Z86718 Personal history of other venous thrombosis and embolism: Secondary | ICD-10-CM | POA: Diagnosis not present

## 2020-05-12 LAB — POCT INR: INR: 3.1 — AB (ref 2.0–3.0)

## 2020-05-12 NOTE — Patient Instructions (Signed)
Description   Resume taking Warfarin 1 tablet everyday. Recheck INR on Tuesday. Call us with any medication changes or concerns. Coumadin Clinic [phone], Main [phone], Fax [phone]

## 2020-05-15 ENCOUNTER — Emergency Department (HOSPITAL_COMMUNITY): Payer: Medicare Other

## 2020-05-15 ENCOUNTER — Encounter (HOSPITAL_COMMUNITY): Payer: Self-pay

## 2020-05-15 ENCOUNTER — Inpatient Hospital Stay (HOSPITAL_COMMUNITY)
Admission: EM | Admit: 2020-05-15 | Discharge: 2020-05-28 | DRG: 344 | Disposition: A | Discharge status: Home Health | Payer: Medicare Other | Attending: Internal Medicine | Admitting: Internal Medicine

## 2020-05-15 DIAGNOSIS — Z9989 Dependence on other enabling machines and devices: Secondary | ICD-10-CM

## 2020-05-15 DIAGNOSIS — Z79899 Other long term (current) drug therapy: Secondary | ICD-10-CM

## 2020-05-15 DIAGNOSIS — Z515 Encounter for palliative care: Secondary | ICD-10-CM

## 2020-05-15 DIAGNOSIS — N200 Calculus of kidney: Secondary | ICD-10-CM | POA: Diagnosis not present

## 2020-05-15 DIAGNOSIS — Z7901 Long term (current) use of anticoagulants: Secondary | ICD-10-CM

## 2020-05-15 DIAGNOSIS — R791 Abnormal coagulation profile: Secondary | ICD-10-CM | POA: Diagnosis not present

## 2020-05-15 DIAGNOSIS — I130 Hypertensive heart and chronic kidney disease with heart failure and stage 1 through stage 4 chronic kidney disease, or unspecified chronic kidney disease: Secondary | ICD-10-CM | POA: Diagnosis present

## 2020-05-15 DIAGNOSIS — Z66 Do not resuscitate: Secondary | ICD-10-CM | POA: Diagnosis present

## 2020-05-15 DIAGNOSIS — Z9049 Acquired absence of other specified parts of digestive tract: Secondary | ICD-10-CM

## 2020-05-15 DIAGNOSIS — Z882 Allergy status to sulfonamides status: Secondary | ICD-10-CM

## 2020-05-15 DIAGNOSIS — K921 Melena: Secondary | ICD-10-CM | POA: Diagnosis present

## 2020-05-15 DIAGNOSIS — R52 Pain, unspecified: Secondary | ICD-10-CM

## 2020-05-15 DIAGNOSIS — Z952 Presence of prosthetic heart valve: Secondary | ICD-10-CM | POA: Diagnosis not present

## 2020-05-15 DIAGNOSIS — Z87891 Personal history of nicotine dependence: Secondary | ICD-10-CM

## 2020-05-15 DIAGNOSIS — I350 Nonrheumatic aortic (valve) stenosis: Secondary | ICD-10-CM | POA: Diagnosis present

## 2020-05-15 DIAGNOSIS — I4819 Other persistent atrial fibrillation: Secondary | ICD-10-CM | POA: Diagnosis not present

## 2020-05-15 DIAGNOSIS — D62 Acute posthemorrhagic anemia: Secondary | ICD-10-CM | POA: Diagnosis present

## 2020-05-15 DIAGNOSIS — N183 Chronic kidney disease, stage 3 unspecified: Secondary | ICD-10-CM | POA: Diagnosis present

## 2020-05-15 DIAGNOSIS — Z950 Presence of cardiac pacemaker: Secondary | ICD-10-CM

## 2020-05-15 DIAGNOSIS — R7881 Bacteremia: Secondary | ICD-10-CM

## 2020-05-15 DIAGNOSIS — Z95828 Presence of other vascular implants and grafts: Secondary | ICD-10-CM

## 2020-05-15 DIAGNOSIS — N281 Cyst of kidney, acquired: Secondary | ICD-10-CM

## 2020-05-15 DIAGNOSIS — N12 Tubulo-interstitial nephritis, not specified as acute or chronic: Secondary | ICD-10-CM | POA: Diagnosis present

## 2020-05-15 DIAGNOSIS — Z96653 Presence of artificial knee joint, bilateral: Secondary | ICD-10-CM | POA: Diagnosis present

## 2020-05-15 DIAGNOSIS — I25118 Atherosclerotic heart disease of native coronary artery with other forms of angina pectoris: Secondary | ICD-10-CM | POA: Diagnosis not present

## 2020-05-15 DIAGNOSIS — Z8546 Personal history of malignant neoplasm of prostate: Secondary | ICD-10-CM

## 2020-05-15 DIAGNOSIS — K5909 Other constipation: Secondary | ICD-10-CM

## 2020-05-15 DIAGNOSIS — Z9841 Cataract extraction status, right eye: Secondary | ICD-10-CM

## 2020-05-15 DIAGNOSIS — N151 Renal and perinephric abscess: Secondary | ICD-10-CM | POA: Diagnosis present

## 2020-05-15 DIAGNOSIS — Z86718 Personal history of other venous thrombosis and embolism: Secondary | ICD-10-CM

## 2020-05-15 DIAGNOSIS — Z9842 Cataract extraction status, left eye: Secondary | ICD-10-CM

## 2020-05-15 DIAGNOSIS — K611 Rectal abscess: Secondary | ICD-10-CM | POA: Diagnosis not present

## 2020-05-15 DIAGNOSIS — Z888 Allergy status to other drugs, medicaments and biological substances status: Secondary | ICD-10-CM

## 2020-05-15 DIAGNOSIS — R410 Disorientation, unspecified: Secondary | ICD-10-CM | POA: Diagnosis not present

## 2020-05-15 DIAGNOSIS — J4540 Moderate persistent asthma, uncomplicated: Secondary | ICD-10-CM | POA: Diagnosis present

## 2020-05-15 DIAGNOSIS — M25572 Pain in left ankle and joints of left foot: Secondary | ICD-10-CM | POA: Diagnosis not present

## 2020-05-15 DIAGNOSIS — R197 Diarrhea, unspecified: Secondary | ICD-10-CM | POA: Diagnosis not present

## 2020-05-15 DIAGNOSIS — Z87442 Personal history of urinary calculi: Secondary | ICD-10-CM

## 2020-05-15 DIAGNOSIS — I119 Hypertensive heart disease without heart failure: Secondary | ICD-10-CM | POA: Diagnosis present

## 2020-05-15 DIAGNOSIS — Z20822 Contact with and (suspected) exposure to covid-19: Secondary | ICD-10-CM | POA: Diagnosis present

## 2020-05-15 DIAGNOSIS — E782 Mixed hyperlipidemia: Secondary | ICD-10-CM | POA: Diagnosis present

## 2020-05-15 DIAGNOSIS — K8020 Calculus of gallbladder without cholecystitis without obstruction: Secondary | ICD-10-CM | POA: Diagnosis not present

## 2020-05-15 DIAGNOSIS — Z6829 Body mass index (BMI) 29.0-29.9, adult: Secondary | ICD-10-CM

## 2020-05-15 DIAGNOSIS — Z86711 Personal history of pulmonary embolism: Secondary | ICD-10-CM

## 2020-05-15 DIAGNOSIS — I5042 Chronic combined systolic (congestive) and diastolic (congestive) heart failure: Secondary | ICD-10-CM | POA: Diagnosis not present

## 2020-05-15 DIAGNOSIS — G4733 Obstructive sleep apnea (adult) (pediatric): Secondary | ICD-10-CM | POA: Diagnosis present

## 2020-05-15 DIAGNOSIS — Z885 Allergy status to narcotic agent status: Secondary | ICD-10-CM

## 2020-05-15 DIAGNOSIS — N1832 Chronic kidney disease, stage 3b: Secondary | ICD-10-CM | POA: Diagnosis present

## 2020-05-15 DIAGNOSIS — B9620 Unspecified Escherichia coli [E. coli] as the cause of diseases classified elsewhere: Secondary | ICD-10-CM | POA: Diagnosis present

## 2020-05-15 DIAGNOSIS — Z961 Presence of intraocular lens: Secondary | ICD-10-CM | POA: Diagnosis present

## 2020-05-15 DIAGNOSIS — Z9079 Acquired absence of other genital organ(s): Secondary | ICD-10-CM

## 2020-05-15 DIAGNOSIS — R188 Other ascites: Secondary | ICD-10-CM

## 2020-05-15 DIAGNOSIS — K5900 Constipation, unspecified: Secondary | ICD-10-CM | POA: Diagnosis not present

## 2020-05-15 DIAGNOSIS — D6859 Other primary thrombophilia: Secondary | ICD-10-CM | POA: Diagnosis present

## 2020-05-15 DIAGNOSIS — Z955 Presence of coronary angioplasty implant and graft: Secondary | ICD-10-CM

## 2020-05-15 DIAGNOSIS — I2510 Atherosclerotic heart disease of native coronary artery without angina pectoris: Secondary | ICD-10-CM | POA: Diagnosis present

## 2020-05-15 DIAGNOSIS — K219 Gastro-esophageal reflux disease without esophagitis: Secondary | ICD-10-CM | POA: Diagnosis present

## 2020-05-15 DIAGNOSIS — E663 Overweight: Secondary | ICD-10-CM | POA: Diagnosis present

## 2020-05-15 DIAGNOSIS — I4821 Permanent atrial fibrillation: Secondary | ICD-10-CM | POA: Diagnosis present

## 2020-05-15 LAB — URINALYSIS, ROUTINE W REFLEX MICROSCOPIC
Bilirubin Urine: NEGATIVE
Glucose, UA: NEGATIVE mg/dL
Hgb urine dipstick: NEGATIVE
Ketones, ur: NEGATIVE mg/dL
Leukocytes,Ua: NEGATIVE
Nitrite: NEGATIVE
Protein, ur: NEGATIVE mg/dL
Specific Gravity, Urine: 1.033 — ABNORMAL HIGH (ref 1.005–1.030)
pH: 7 (ref 5.0–8.0)

## 2020-05-15 LAB — CBC WITH DIFFERENTIAL/PLATELET
Abs Immature Granulocytes: 0.09 10*3/uL — ABNORMAL HIGH (ref 0.00–0.07)
Basophils Absolute: 0 10*3/uL (ref 0.0–0.1)
Basophils Relative: 0 %
Eosinophils Absolute: 0.2 10*3/uL (ref 0.0–0.5)
Eosinophils Relative: 2 %
HCT: 32.8 % — ABNORMAL LOW (ref 39.0–52.0)
Hemoglobin: 10.4 g/dL — ABNORMAL LOW (ref 13.0–17.0)
Immature Granulocytes: 1 %
Lymphocytes Relative: 13 %
Lymphs Abs: 1.5 10*3/uL (ref 0.7–4.0)
MCH: 25.5 pg — ABNORMAL LOW (ref 26.0–34.0)
MCHC: 31.7 g/dL (ref 30.0–36.0)
MCV: 80.4 fL (ref 80.0–100.0)
Monocytes Absolute: 1 10*3/uL (ref 0.1–1.0)
Monocytes Relative: 8 %
Neutro Abs: 8.9 10*3/uL — ABNORMAL HIGH (ref 1.7–7.7)
Neutrophils Relative %: 76 %
Platelets: 283 10*3/uL (ref 150–400)
RBC: 4.08 MIL/uL — ABNORMAL LOW (ref 4.22–5.81)
RDW: 18.4 % — ABNORMAL HIGH (ref 11.5–15.5)
WBC: 11.8 10*3/uL — ABNORMAL HIGH (ref 4.0–10.5)
nRBC: 0 % (ref 0.0–0.2)

## 2020-05-15 LAB — BASIC METABOLIC PANEL
Anion gap: 8 (ref 5–15)
BUN: 27 mg/dL — ABNORMAL HIGH (ref 8–23)
CO2: 26 mmol/L (ref 22–32)
Calcium: 9.3 mg/dL (ref 8.9–10.3)
Chloride: 104 mmol/L (ref 98–111)
Creatinine, Ser: 1.53 mg/dL — ABNORMAL HIGH (ref 0.61–1.24)
GFR, Estimated: 44 mL/min — ABNORMAL LOW (ref >60)
Glucose, Bld: 108 mg/dL — ABNORMAL HIGH (ref 70–99)
Potassium: 4.2 mmol/L (ref 3.5–5.1)
Sodium: 138 mmol/L (ref 135–145)

## 2020-05-15 LAB — TYPE AND SCREEN
ABO/RH(D): A POS
Antibody Screen: NEGATIVE

## 2020-05-15 LAB — PROTIME-INR
INR: 3.3 — ABNORMAL HIGH (ref 0.8–1.2)
Prothrombin Time: 32.6 seconds — ABNORMAL HIGH (ref 11.4–15.2)

## 2020-05-15 MED ORDER — LIDOCAINE HCL URETHRAL/MUCOSAL 2 % EX GEL
1.0000 "application " | Freq: Once | CUTANEOUS | Status: AC
  Administered 2020-05-15: 1 via TOPICAL
  Filled 2020-05-15: qty 11

## 2020-05-15 MED ORDER — SODIUM CHLORIDE 0.9 % IV SOLN: INTRAVENOUS | Status: DC

## 2020-05-15 MED ORDER — PIPERACILLIN-TAZOBACTAM 3.375 G IVPB 30 MIN
3.3750 g | Freq: Once | INTRAVENOUS | Status: AC
  Administered 2020-05-15: 3.375 g via INTRAVENOUS
  Filled 2020-05-15: qty 50

## 2020-05-15 MED ORDER — POLYETHYLENE GLYCOL 3350 17 G PO PACK
34.0000 g | PACK | Freq: Two times a day (BID) | ORAL | Status: DC
  Administered 2020-05-16 – 2020-05-25 (×6): 34 g via ORAL
  Filled 2020-05-15 (×18): qty 2

## 2020-05-15 MED ORDER — ENSURE SURGERY PO LIQD
237.0000 mL | Freq: Two times a day (BID) | ORAL | Status: AC
  Administered 2020-05-16 – 2020-05-22 (×12): 237 mL via ORAL
  Filled 2020-05-15 (×14): qty 237

## 2020-05-15 MED ORDER — PHYTONADIONE 5 MG PO TABS
2.5000 mg | ORAL_TABLET | Freq: Once | ORAL | Status: AC
  Administered 2020-05-15: 2.5 mg via ORAL
  Filled 2020-05-15: qty 1

## 2020-05-15 MED ORDER — IOHEXOL 300 MG/ML  SOLN
80.0000 mL | Freq: Once | INTRAMUSCULAR | Status: AC | PRN
  Administered 2020-05-15: 80 mL via INTRAVENOUS

## 2020-05-15 NOTE — ED Provider Notes (Signed)
[facility] DEPT Provider Note   CSN: [id number] Arrival date & time: 05/15/20  1912     History Chief Complaint  Patient presents with  . Rectal Bleeding    [name] is a 87 y.o. male.  87 year old male presents with rectal bleeding times several days.  States it all started after he was trying to strain during a bowel movement.  He does take Coumadin.  States he has had several drops come out.  Since that time has had rectal fullness.  Has been using suppositories without relief.  Denies any severe abdominal discomfort.  No fever or chills.  No emesis.        Past Medical History:  Diagnosis Date  . Aortic valve stenosis 04/05/2015   Formatting of this note might be different from the original. Overview:  ECHO 5/15 Formatting of this note might be different from the original. Overview:  Overview:  ECHO 5/15  . Arthritis    "knees, right shoulder" (03/14/2017)  . Atrial fibrillation, persistent ([place]) 09/19/2014  . CAD (coronary artery disease), native coronary artery    Cath 2011 LHC (08/2009):~ Proximal LAD 30%, mid to distal LAD 25%, ostial small D1 75% mid AV groove circumflex 99% been subtotal stenosis, proximal to mid RCA 25-30%, mid RCA 30%, mid PDA 30%, EF 50% with inferior hypokinesis.;    July 2011  PCI and DES to circumflex Dr. Olevia Perches   ETT-Myoview (06/2013):  Inferolateral scar, mild peri-infarct ischemia, EF 40%; Intermediate Risk   ECHO EF 55% 03/2013   . CAD (coronary artery disease), native coronary artery 04/05/2015   Cath 2011 [facility] (08/2009):~ Proximal LAD 30%, mid to distal LAD 25%, ostial small D1 75% mid AV groove circumflex 99% been subtotal stenosis, proximal to mid RCA 25-30%, mid RCA 30%, mid PDA 30%, EF 50% with inferior hypokinesis.;    July 2011  PCI and DES to circumflex Dr. Olevia Perches   ETT-Myoview (06/2013):  Inferolateral scar, mild peri-infarct ischemia, EF 40%; Intermediate Risk   ECHO EF 55% 03/2013    . Cardiac pacemaker in  situ 03/14/2017   Biventricular [employer] inserted 03/14/17 Dr. Lovena Le for second degree heart block   . Chronic combined systolic and diastolic heart failure ([place]) 11/19/2017  . CKD (chronic kidney disease), stage III ([place])   . Gastro-esophageal reflux disease without esophagitis 09/24/2009  . GERD   . History of infection of prosthetic knee 04/05/2015   Treated with debridement and irrigation followed by 6 months of triple antibiotics  . History of kidney stones   . History of peptic ulcer 1970s?  [facility] History of prostate cancer    Radical prostatectomy in 2006 Dr. Terance Hart   . Hypertensive heart disease   . Internal hemorrhoids 09/11/2018  . Long term (current) use of anticoagulants 10/06/2011  . Mixed hyperlipidemia   . Moderate persistent asthma without complication 4/82/5003  . Noise effect on both inner ears 08/14/2018  . Obesity (BMI 30-39.9)   . OSA on CPAP   . OSA treated with BiPAP 11/15/2015  . Persistent atrial fibrillation ([place]) 09/19/2014   CHA2DS2VASC score 5  Cardioversion 10/08/2014 was on amiodarone until 2018   . 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)    Initial DVT in 2006 after prostate surgery and had [place] filter placed Bilateral  PE 2013 and placed back on warfarin DVT of right subclavian vein on doppler 10/2012 at time of knee infection   . Personal history of  malignant neoplasm of prostate    Radical prostatectomy in 2006 Dr. Terance Hart    . Presbycusis of both ears 08/14/2018  . Severe aortic stenosis   . Severe aortic stenosis 03/18/2018  . Urinary incontinence    MULTIPLE BLADDER SURGERIES - STATES NO URINARY SPHINCTER - PT'S UROLOGIST IS AT [facility]- DR. PETERSON  ( LAST VISIT WAS 09/15/11 )    Patient Active Problem List   Diagnosis Date Noted  . Severe aortic stenosis   . OSA on CPAP   . History of prostate cancer   . History of peptic ulcer   . History of kidney stones   . GERD   . CAD (coronary artery disease), native coronary  artery   . Arthritis   . Internal hemorrhoids 09/11/2018  . Noise effect on both inner ears 08/14/2018  . Presbycusis of both ears 08/14/2018  . 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 CKD (chronic kidney disease), stage III ([place])   . Chronic combined systolic and diastolic heart failure ([place]) 11/19/2017  . Cardiac pacemaker in situ 03/14/2017  . 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) 01/24/2016  . OSA treated with BiPAP 11/15/2015  . Personal history of malignant neoplasm of prostate 04/05/2015  . Hypertensive heart disease 04/05/2015  . 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[place]) 04/05/2015  . CAD (coronary artery disease), native coronary artery 04/05/2015  . Aortic valve stenosis 04/05/2015  . Persistent atrial fibrillation ([place]) 09/19/2014  . Moderate persistent asthma without complication 35/00/9381  . Atrial fibrillation, persistent ([place]) 09/19/2014  . Angina pectoris ([place]) 09/18/2014  . Urinary incontinence 03/04/2014  . Obesity (BMI 30-39.9)   . Long term (current) use of anticoagulants 10/06/2011  . Mixed hyperlipidemia 02/24/2010  . Gastro-esophageal reflux disease without esophagitis 09/24/2009    Past Surgical History:  Procedure Laterality Date  . APPENDECTOMY    . BI-VENTRICULAR PACEMAKER INSERTION (CRT-P)  03/14/2017  . BIV PACEMAKER INSERTION CRT-P N/A 03/14/2017   Procedure: BIV PACEMAKER INSERTION CRT-P;  Surgeon: Evans Lance, MD;  Location: [facility];  Service: Cardiovascular;  Laterality: N/A;  . CARDIOVERSION N/A 10/08/2014   Procedure: CARDIOVERSION;  Surgeon: Jacolyn Reedy, MD;  Location: [facility] ENDOSCOPY;  Service: Cardiovascular;  Laterality: N/A;  . CATARACT EXTRACTION W/ INTRAOCULAR LENS  IMPLANT, BILATERAL Bilateral   . CORONARY ANGIOPLASTY WITH STENT PLACEMENT  08/25/2009   DES-mid LCx 08/2009; 30% pLAD, 25% m/dLAD, 75% ostial D1, 99% mLCx s/p DES, 25-30% p/mRCA, 40% mRCA, 30% mPDA stenoses; LVEF  50%, inf hypokinesis  . CORONARY STENT INTERVENTION N/A 03/18/2018   Procedure: CORONARY STENT INTERVENTION;  Surgeon: Sherren Mocha, MD;  Location: [facility];  Service: Cardiovascular;  Laterality: N/A;  . EXCISIONAL HEMORRHOIDECTOMY    . I & D KNEE WITH POLY EXCHANGE Left 10/09/2012   Procedure: IRRIGATION AND DEBRIDEMENT LEFT KNEE WITH POLY REVISION;  Surgeon: Gearlean Alf, MD;  Location: [facility];  Service: Orthopedics;  Laterality: Left;  . INGUINAL HERNIA REPAIR Right   . INTRAOPERATIVE TRANSTHORACIC ECHOCARDIOGRAM  03/19/2018   Procedure: Intraoperative Transthoracic Echocardiogram;  Surgeon: Sherren Mocha, MD;  Location: [facility];  Service: Open Heart Surgery;;  . JOINT REPLACEMENT    . KNEE ARTHROTOMY Left 10/09/2012   Procedure: LEFT KNEE ARTHROTOMY;  Surgeon: Gearlean Alf, MD;  Location: [facility];  Service: Orthopedics;  Laterality: Left;  . LEFT HEART CATH AND CORONARY ANGIOGRAPHY N/A 02/28/2018   Procedure: LEFT HEART CATH AND CORONARY ANGIOGRAPHY;  Surgeon: Sherren Mocha, MD;  Location: [facility];  Service: Cardiovascular;  Laterality: N/A;  . LEFT HEART CATH AND CORONARY ANGIOGRAPHY N/A 09/12/2019   Procedure: LEFT HEART CATH AND CORONARY ANGIOGRAPHY;  Surgeon: Sherren Mocha, MD;  Location: [facility];  Service: Cardiovascular;  Laterality: N/A;  . PROSTATECTOMY  04/25/2004  . REPLACEMENT TOTAL KNEE Bilateral   . SKIN BIOPSY     "off nose; wasn't cancer; it was tested" (03/14/2017)  . TRANSCATHETER AORTIC VALVE REPLACEMENT, TRANSFEMORAL N/A 03/19/2018   Procedure: TRANSCATHETER AORTIC VALVE REPLACEMENT, TRANSFEMORAL;  Surgeon: Sherren Mocha, MD;  Location: [facility];  Service: Open Heart Surgery;  Laterality: N/A;  . Uretheral implants     multiple for incontinence  . VENA CAVA FILTER PLACEMENT         Family History  Problem Relation Age of Onset  . Melanoma Father   . Melanoma Brother     Social History   Tobacco Use  . Smoking status: Former  Smoker    Packs/day: 1.00    Years: 10.00    Pack years: 10.00    Types: Cigarettes    Quit date: 04/27/1961    Years since quitting: 59.0  . Smokeless tobacco: Never Used  Vaping Use  . Vaping Use: Never used  Substance Use Topics  . Alcohol use: No    Alcohol/week: 0.0 standard drinks  . Drug use: No    Home Medications Prior to Admission medications   Medication Sig Start Date End Date Taking? Authorizing Provider  acetaminophen (TYLENOL) 500 MG tablet Take 500 mg by mouth at bedtime.     [provider]  amLODipine (NORVASC) 2.5 MG tablet Take 1 tablet (2.5 mg total) by mouth daily. 09/03/19   Tobb, Kardie, DO  Ascorbic Acid (VITAMIN C) 1000 MG tablet Take 1,000 mg by mouth daily.    [provider]  Biotin 2500 MCG CAPS Take 2,500 mcg by mouth daily.    [provider]  cefpodoxime (VANTIN) 200 MG tablet Take 1 tablet (200 mg total) by mouth 2 (two) times daily. 04/23/20   Molpus, John, MD  cholecalciferol (VITAMIN D) 1000 UNITS tablet Take 1,000 Units by mouth daily.    [provider]  docusate sodium (COLACE) 50 MG capsule Take 50 mg by mouth daily.    [provider]  furosemide (LASIX) 20 MG tablet Take 20 mg by mouth daily.     [provider]  Glucosamine HCl (GLUCOSAMINE PO) Take 2,000 mg by mouth daily.    [provider]  isosorbide mononitrate (IMDUR) 30 MG 24 hr tablet Take 0.5 tablets (15 mg total) by mouth daily. 02/05/20   Richardo Priest, MD  Multiple Vitamin (MULTIVITAMIN) capsule Take 1 capsule by mouth daily.  04/28/19   [provider]  nitroGLYCERIN (NITROSTAT) 0.4 MG SL tablet Place 1 tablet (0.4 mg total) under the tongue every 5 (five) minutes as needed. 07/14/19   Richardo Priest, MD  Omega-3 Fatty Acids (FISH OIL) 1000 MG CAPS Take 1,000 mg by mouth daily.    [provider]  omeprazole (PRILOSEC) 20 MG capsule Take 1 capsule (20 mg total) by mouth daily. 04/21/20   Richardo Priest, MD  pravastatin (PRAVACHOL) 20 MG tablet TAKE ONE TABLET BY MOUTH ONCE DAILY Patient taking differently: Take 20 mg by mouth daily. 01/08/20   Richardo Priest, MD  vitamin B-12 (CYANOCOBALAMIN) 500 MCG tablet Take 500 mcg by mouth daily.  [provider]  warfarin (COUMADIN) 2.5 MG tablet Take 1 tablet daily or as directed by [facility] Patient taking differently: Take 2.5 mg by mouth daily. 04/16/20   Richardo Priest, MD    Allergies    Darifenacin, Codeine, Sulfamethoxazole-trimethoprim, and Lisinopril  Review of Systems   Review of Systems  All other systems reviewed and are negative.   Physical Exam Updated Vital Signs BP 134/74 (BP Location: Left Arm)   Pulse 61   Temp 98.3 F (36.8 C) (Oral)   Resp 18   Ht 1.829 m (6')   Wt 97.1 kg   SpO2 100%   BMI 29.02 kg/m   Physical Exam Vitals and nursing note reviewed.  Constitutional:      General: He is not in acute distress.    Appearance: Normal appearance. He is well-developed. He is not toxic-appearing.  HENT:     Head: Normocephalic and atraumatic.  Eyes:     General: Lids are normal.     Conjunctiva/sclera: Conjunctivae normal.     Pupils: Pupils are equal, round, and reactive to light.  Neck:     Thyroid: No thyroid mass.     Trachea: No tracheal deviation.  Cardiovascular:     Rate and Rhythm: Normal rate and regular rhythm.     Heart sounds: Normal heart sounds. No murmur heard. No gallop.   Pulmonary:     Effort: Pulmonary effort is normal. No respiratory distress.     Breath sounds: Normal breath sounds. No stridor. No decreased breath sounds, wheezing, rhonchi or rales.  Abdominal:     General: Bowel sounds are normal. There is no distension.     Palpations: Abdomen is soft.     Tenderness: There is no abdominal tenderness. There is no rebound.  Genitourinary:    Comments: No gross blood per rectum.  Tender indurated area on left perirectal area Musculoskeletal:        General: No  tenderness. Normal range of motion.     Cervical back: Normal range of motion and neck supple.  Skin:    General: Skin is warm and dry.     Findings: No abrasion or rash.  Neurological:     Mental Status: He is alert and oriented to person, place, and time.     GCS: GCS eye subscore is 4. GCS verbal subscore is 5. GCS motor subscore is 6.     Cranial Nerves: No cranial nerve deficit.     Sensory: No sensory deficit.  Psychiatric:        Speech: Speech normal.        Behavior: Behavior normal.     ED Results / Procedures / Treatments   Labs (all labs ordered are listed, but only abnormal results are displayed) Labs Reviewed  CBC WITH DIFFERENTIAL/PLATELET  BASIC METABOLIC PANEL  PROTIME-INR  TYPE AND SCREEN    EKG None  Radiology No results found.  Procedures Procedures   Medications Ordered in ED Medications  0.9 %  sodium chloride infusion (has no administration in time range)    ED Course  I have reviewed the triage vital signs and the nursing notes.  Pertinent labs & imaging results that were available during my care of the patient were reviewed by me and considered in my medical decision making (see chart for details).    MDM Rules/Calculators/A&P  Patient medicated for pain here.  CT scan shows perirectal abscess.  Case discussed with Dr. Johney Maine from neurosurgery recommends medicine admission and IV antibiotics.  Patient started on Zosyn.  His INR is 3.3.  Will defer reversal to hospitalist service. Final Clinical Impression(s) / ED Diagnoses Final diagnoses:  None    Rx / DC Orders ED Discharge Orders    None       Lacretia Leigh, MD 05/15/20 2314

## 2020-05-15 NOTE — H&P (Signed)
History and Physical    [name] HAL:[id number] DOB: 1931/10/31 DOA: 05/15/2020  PCP: System, Provider Not In  Patient coming from: Home, wife at bedside  I have personally briefly reviewed patient's old medical records in [place]  Chief Complaint: Rectal bleeding  HPI: Hershel P Dysart is a 87 y.o. male with medical history significant hx of prostate cx s/p prostatectomy 2016,  permanent atrial fibrillation, history of recurrent DVT, PE s/p IVC filter on Coumadin, aortic stenosis s/p TAVR, CAD, hypertensive heart failure s/p pacemaker, CKD stage IIIb who presents with concerns of rectal pain.   Patient reports that for the past week he has felt more constipated.  About 4 days ago he noted dark stools.  Then the following day he took milk of magnesium and had about 3 days of dark-colored diarrhea.  He also noted several drops of bright red blood when wiping.  Also began to feel sharp rectal pain with straining prompting him to present to the ED.  In the ED, he was afebrile and normotensive on room air.  Hemoglobin notably has dropped from 16 several weeks ago down to 10.4.  Leukocytosis of 11.8.  INR supratherapeutic at 3.3.  His last Coumadin dose was last night on 4/9.  Patient was noted to have an INR of 6.1 back in 05/10/2020 at PCP office.  CT abdomen and pelvis shows left posterior perirectal abscess and interval increase in size of right subcapsular fluid collection suggestive of right-sided acute pyelonephritis.  Of note, about a month ago patient had large right-sided kidney stone and UTI that failed treatment with Bactrim.  He presented to ED on 3/17 and was discharged on Cefpodoxime.  He was evaluated again with persistent right flank pain on 3/22 in the ED with CT abdomen showing moderate to severe right perinephric edema and stranding in the right pelvis and proximal ureter but given that his leukocytosis was improving he was asked to continue previous course of  antibiotic.  Urine culture from both ED visit showed no growth.   Review of Systems: Constitutional: No Weight Change, No Fever ENT/Mouth: No sore throat, No Rhinorrhea Eyes: No Eye Pain, No Vision Changes Cardiovascular: No Chest Pain, no SOB Respiratory: No Cough, No Sputum Gastrointestinal: No Nausea, No Vomiting, No Diarrhea, + Constipation, Genitourinary: no Urinary Incontinence, No Urgency, + Flank Pain Musculoskeletal: No Arthralgias, No Myalgias Skin: No Skin Lesions, No Pruritus, Neuro: no Weakness, No Numbness, Psych: No Anxiety/Panic, No Depression, no decrease appetite Heme/Lymph: No Bruising, No Bleeding  Past Medical History:  Diagnosis Date  . Aortic valve stenosis 04/05/2015   Formatting of this note might be different from the original. Overview:  ECHO 5/15 Formatting of this note might be different from the original. Overview:  Overview:  ECHO 5/15  . Arthritis    "knees, right shoulder" (03/14/2017)  . Atrial fibrillation, persistent ([place]) 09/19/2014  . CAD (coronary artery disease), native coronary artery    Cath 2011 LHC (08/2009):~ Proximal LAD 30%, mid to distal LAD 25%, ostial small D1 75% mid AV groove circumflex 99% been subtotal stenosis, proximal to mid RCA 25-30%, mid RCA 30%, mid PDA 30%, EF 50% with inferior hypokinesis.;    July 2011  PCI and DES to circumflex Dr. Olevia Perches   ETT-Myoview (06/2013):  Inferolateral scar, mild peri-infarct ischemia, EF 40%; Intermediate Risk   ECHO EF 55% 03/2013   . CAD (coronary artery disease), native coronary artery 04/05/2015   Cath 2011 LHC (08/2009):~ Proximal LAD 30%, mid  to distal LAD 25%, ostial small D1 75% mid AV groove circumflex 99% been subtotal stenosis, proximal to mid RCA 25-30%, mid RCA 30%, mid PDA 30%, EF 50% with inferior hypokinesis.;    July 2011  PCI and DES to circumflex Dr. Olevia Perches   ETT-Myoview (06/2013):  Inferolateral scar, mild peri-infarct ischemia, EF 40%; Intermediate Risk   ECHO EF 55% 03/2013    . Cardiac  pacemaker in situ 03/14/2017   Biventricular St. Jude inserted 03/14/17 Dr. Lovena Le for second degree heart block   . Chronic combined systolic and diastolic heart failure ([place]) 11/19/2017  . CKD (chronic kidney disease), stage III ([place])   . Gastro-esophageal reflux disease without esophagitis 09/24/2009  . GERD   . History of infection of prosthetic knee 04/05/2015   Treated with debridement and irrigation followed by 6 months of triple antibiotics  . History of kidney stones   . History of peptic ulcer 1970s?  Marland Kitchen History of prostate cancer    Radical prostatectomy in 2006 Dr. Terance Hart   . Hypertensive heart disease   . Internal hemorrhoids 09/11/2018  . Long term (current) use of anticoagulants 10/06/2011  . Mixed hyperlipidemia   . Moderate persistent asthma without complication 0/34/7425  . Noise effect on both inner ears 08/14/2018  . Obesity (BMI 30-39.9)   . OSA on CPAP   . OSA treated with BiPAP 11/15/2015  . Persistent atrial fibrillation ([place]) 09/19/2014   CHA2DS2VASC score 5  Cardioversion 10/08/2014 was on amiodarone until 2018   . 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)    Initial DVT in 2006 after prostate surgery and had [place] filter placed Bilateral  PE 2013 and placed back on warfarin DVT of right subclavian vein on doppler 10/2012 at time of knee infection   . Personal history of malignant neoplasm of prostate    Radical prostatectomy in 2006 Dr. Terance Hart    . Presbycusis of both ears 08/14/2018  . Severe aortic stenosis   . Severe aortic stenosis 03/18/2018  . Urinary incontinence    MULTIPLE BLADDER SURGERIES - STATES NO URINARY SPHINCTER - PT'S UROLOGIST IS AT [facility]- DR. PETERSON  ( LAST VISIT WAS 09/15/11 )    Past Surgical History:  Procedure Laterality Date  . APPENDECTOMY    . BI-VENTRICULAR PACEMAKER INSERTION (CRT-P)  03/14/2017  . BIV PACEMAKER INSERTION CRT-P N/A 03/14/2017   Procedure: BIV PACEMAKER INSERTION CRT-P;  Surgeon: Evans Lance, MD;  Location: [place] [facility];  Service: Cardiovascular;  Laterality: N/A;  . CARDIOVERSION N/A 10/08/2014   Procedure: CARDIOVERSION;  Surgeon: Jacolyn Reedy, MD;  Location: [facility];  Service: Cardiovascular;  Laterality: N/A;  . CATARACT EXTRACTION W/ INTRAOCULAR LENS  IMPLANT, BILATERAL Bilateral   . CORONARY ANGIOPLASTY WITH STENT PLACEMENT  08/25/2009   DES-mid LCx 08/2009; 30% pLAD, 25% m/dLAD, 75% ostial D1, 99% mLCx s/p DES, 25-30% p/mRCA, 40% mRCA, 30% mPDA stenoses; LVEF 50%, inf hypokinesis  . CORONARY STENT INTERVENTION N/A 03/18/2018   Procedure: CORONARY STENT INTERVENTION;  Surgeon: Sherren Mocha, MD;  Location: [place] [facility];  Service: Cardiovascular;  Laterality: N/A;  . EXCISIONAL HEMORRHOIDECTOMY    . I & D KNEE WITH POLY EXCHANGE Left 10/09/2012   Procedure: IRRIGATION AND DEBRIDEMENT LEFT KNEE WITH POLY REVISION;  Surgeon: Gearlean Alf, MD;  Location: [facility];  Service: Orthopedics;  Laterality: Left;  . INGUINAL HERNIA REPAIR Right   . INTRAOPERATIVE TRANSTHORACIC ECHOCARDIOGRAM  03/19/2018   Procedure: Intraoperative  Transthoracic Echocardiogram;  Surgeon: Sherren Mocha, MD;  Location: [place];  Service: Open Heart Surgery;;  . JOINT REPLACEMENT    . KNEE ARTHROTOMY Left 10/09/2012   Procedure: LEFT KNEE ARTHROTOMY;  Surgeon: Gearlean Alf, MD;  Location: [facility];  Service: Orthopedics;  Laterality: Left;  . LEFT HEART CATH AND CORONARY ANGIOGRAPHY N/A 02/28/2018   Procedure: LEFT HEART CATH AND CORONARY ANGIOGRAPHY;  Surgeon: Sherren Mocha, MD;  Location: [place] [facility];  Service: Cardiovascular;  Laterality: N/A;  . LEFT HEART CATH AND CORONARY ANGIOGRAPHY N/A 09/12/2019   Procedure: LEFT HEART CATH AND CORONARY ANGIOGRAPHY;  Surgeon: Sherren Mocha, MD;  Location: [place] [place] [facility];  Service: Cardiovascular;  Laterality: N/A;  . PROSTATECTOMY  04/25/2004  . REPLACEMENT TOTAL KNEE Bilateral   . SKIN BIOPSY     "off nose; wasn't cancer; it was  tested" (03/14/2017)  . TRANSCATHETER AORTIC VALVE REPLACEMENT, TRANSFEMORAL N/A 03/19/2018   Procedure: TRANSCATHETER AORTIC VALVE REPLACEMENT, TRANSFEMORAL;  Surgeon: Sherren Mocha, MD;  Location: [place] [facility];  Service: Open Heart Surgery;  Laterality: N/A;  . Uretheral implants     multiple for incontinence  . VENA CAVA FILTER PLACEMENT       reports that he quit smoking about 59 years ago. His smoking use included cigarettes. He has a 10.00 pack-year smoking history. He has never used smokeless tobacco. He reports that he does not drink alcohol and does not use drugs. Social History  Allergies  Allergen Reactions  . Darifenacin Nausea Only and Other (See Comments)    dry mouth and dizziness ENABLEX Dizziness Pt denies  . Codeine Other (See Comments)  . Sulfamethoxazole-Trimethoprim Other (See Comments)  . Lisinopril Cough    Pt denies    Family History  Problem Relation Age of Onset  . Melanoma Father   . Melanoma Brother      Prior to Admission medications   Medication Sig Start Date End Date Taking? Authorizing Provider  acetaminophen (TYLENOL) 500 MG tablet Take 500 mg by mouth at bedtime.    Yes [provider]  amLODipine (NORVASC) 2.5 MG tablet Take 1 tablet (2.5 mg total) by mouth daily. 09/03/19  Yes Tobb, Kardie, DO  Ascorbic Acid (VITAMIN C) 1000 MG tablet Take 1,000 mg by mouth daily.   Yes [provider]  Biotin 2500 MCG CAPS Take 2,500 mcg by mouth daily.   Yes [provider]  cholecalciferol (VITAMIN D) 1000 UNITS tablet Take 1,000 Units by mouth daily.   Yes [provider]  docusate sodium (COLACE) 50 MG capsule Take 50 mg by mouth daily.   Yes [provider]  furosemide (LASIX) 20 MG tablet Take 20 mg by mouth daily.   Yes [provider]  Glucosamine HCl (GLUCOSAMINE PO) Take 2,000 mg by mouth daily.   Yes [provider]  isosorbide mononitrate (IMDUR) 30 MG 24 hr tablet Take 0.5 tablets (15 mg  total) by mouth daily. 02/05/20  Yes Richardo Priest, MD  Multiple Vitamin (MULTIVITAMIN) capsule Take 1 capsule by mouth daily.  04/28/19  Yes [provider]  nitroGLYCERIN (NITROSTAT) 0.4 MG SL tablet Place 1 tablet (0.4 mg total) under the tongue every 5 (five) minutes as needed. 07/14/19  Yes Richardo Priest, MD  Omega-3 Fatty Acids (FISH OIL) 1000 MG CAPS Take 1,000 mg by mouth daily.   Yes [provider]  omeprazole (PRILOSEC) 20 MG capsule Take 1 capsule (20 mg total) by mouth daily. 04/21/20  Yes Munley,  Hilton Cork, MD  pravastatin (PRAVACHOL) 20 MG tablet TAKE ONE TABLET BY MOUTH ONCE DAILY Patient taking differently: Take 20 mg by mouth daily. 01/08/20  Yes Richardo Priest, MD  vitamin B-12 (CYANOCOBALAMIN) 500 MCG tablet Take 500 mcg by mouth daily.   Yes [provider]  warfarin (COUMADIN) 2.5 MG tablet Take 1 tablet daily or as directed by [facility] Patient taking differently: Take 2.5 mg by mouth daily. 04/16/20  Yes Richardo Priest, MD  cefpodoxime (VANTIN) 200 MG tablet Take 1 tablet (200 mg total) by mouth 2 (two) times daily. Patient not taking: No sig reported 04/23/20   Shanon Rosser, MD    Physical Exam: Vitals:   05/15/20 1932 05/15/20 2000 05/15/20 2100 05/15/20 2200  BP: 134/74 128/67 (!) 149/63 134/60  Pulse: 61 64 (!) 58 64  Resp: 18 (!) 26 (!) 26 (!) 23  Temp: 98.3 F (36.8 C)     TempSrc: Oral     SpO2: 100% 99% 99% 100%  Weight:      Height:        Constitutional: NAD, calm, comfortable, elderly gentleman lying flat in bed Vitals:   05/15/20 1932 05/15/20 2000 05/15/20 2100 05/15/20 2200  BP: 134/74 128/67 (!) 149/63 134/60  Pulse: 61 64 (!) 58 64  Resp: 18 (!) 26 (!) 26 (!) 23  Temp: 98.3 F (36.8 C)     TempSrc: Oral     SpO2: 100% 99% 99% 100%  Weight:      Height:       Eyes: PERRL, lids and conjunctivae normal ENMT: Mucous membranes are moist.  Neck: normal, supple Respiratory: clear to auscultation bilaterally, no  wheezing, no crackles. Normal respiratory effort. No accessory muscle use.  Cardiovascular: Regular rate and rhythm, no murmurs / rubs / gallops. No extremity edema. .  Abdomen: no tenderness, no masses palpated.  Bowel sounds positive.  Rectal: No bright blood per rectum, hard firm indurated abscess at 9:00 Musculoskeletal: no clubbing / cyanosis. No joint deformity upper and lower extremities. Good ROM, no contractures. Normal muscle tone.  Skin: no rashes, lesions, ulcers. No induration Neurologic: CN 2-12 grossly intact. Sensation intact,  Strength 5/5 in all 4.  Psychiatric: Normal judgment and insight. Alert and oriented x 3. Normal mood.     Labs on Admission: I have personally reviewed following labs and imaging studies  CBC: Recent Labs  Lab 05/15/20 2000  WBC 11.8*  NEUTROABS 8.9*  HGB 10.4*  HCT 32.8*  MCV 80.4  PLT 631   Basic Metabolic Panel: Recent Labs  Lab 05/15/20 2000  NA 138  K 4.2  CL 104  CO2 26  GLUCOSE 108*  BUN 27*  CREATININE 1.53*  CALCIUM 9.3   GFR: Estimated Creatinine Clearance: 41.1 mL/min (A) (by C-G formula based on SCr of 1.53 mg/dL (H)). Liver Function Tests: No results for input(s): AST, ALT, ALKPHOS, BILITOT, PROT, ALBUMIN in the last 168 hours. No results for input(s): LIPASE, AMYLASE in the last 168 hours. No results for input(s): AMMONIA in the last 168 hours. Coagulation Profile: Recent Labs  Lab 05/12/20 0928 05/15/20 2000  INR 3.1* 3.3*   Cardiac Enzymes: No results for input(s): CKTOTAL, CKMB, CKMBINDEX, TROPONINI in the last 168 hours. BNP (last 3 results) No results for input(s): PROBNP in the last 8760 hours. HbA1C: No results for input(s): HGBA1C in the last 72 hours. CBG: No results for input(s): GLUCAP in the last 168 hours. Lipid Profile: No results for input(s):  CHOL, HDL, LDLCALC, TRIG, CHOLHDL, LDLDIRECT in the last 72 hours. Thyroid Function Tests: No results for input(s): TSH, T4TOTAL, FREET4, T3FREE,  THYROIDAB in the last 72 hours. Anemia Panel: No results for input(s): VITAMINB12, FOLATE, FERRITIN, TIBC, IRON, RETICCTPCT in the last 72 hours. Urine analysis:    Component Value Date/Time   COLORURINE YELLOW 04/27/2020 1102   APPEARANCEUR CLEAR 04/27/2020 1102   LABSPEC 1.021 04/27/2020 1102   PHURINE 5.0 04/27/2020 1102   GLUCOSEU NEGATIVE 04/27/2020 1102   HGBUR NEGATIVE 04/27/2020 1102   BILIRUBINUR NEGATIVE 04/27/2020 1102   KETONESUR 20 (A) 04/27/2020 1102   PROTEINUR 30 (A) 04/27/2020 1102   UROBILINOGEN 0.2 09/19/2014 1027   NITRITE NEGATIVE 04/27/2020 1102   LEUKOCYTESUR NEGATIVE 04/27/2020 1102    Radiological Exams on Admission: CT Abdomen Pelvis W Contrast  Result Date: 05/15/2020 CLINICAL DATA:  Painful bowel movement with rectal bleeding. EXAM: CT ABDOMEN AND PELVIS WITH CONTRAST TECHNIQUE: Multidetector CT imaging of the abdomen and pelvis was performed using the standard protocol following bolus administration of intravenous contrast. CONTRAST:  71mL OMNIPAQUE IOHEXOL 300 MG/ML  SOLN COMPARISON:  April 22, 2020 FINDINGS: Lower chest: Mild areas of atelectasis are seen within the bilateral lung bases. There is an artificial aortic valve. A small pericardial effusion is noted. Hepatobiliary: No focal liver abnormality is seen. Subcentimeter gallstones are seen within the lumen of an otherwise normal-appearing gallbladder. Pancreas: Unremarkable. No pancreatic ductal dilatation or surrounding inflammatory changes. Spleen: Normal in size without focal abnormality. Adrenals/Urinary Tract: Adrenal glands are unremarkable. A 1.5 cm x 1.3 cm cyst is seen within the lateral aspect of the mid left kidney. Renal cortical thinning is again seen on the right. A 7.1 cm x 4.7 cm lobulated area of low attenuation (approximately 22.12 Hounsfield units) is seen within the anterior aspect of the right kidney. This is seen as an area of subcapsular fluid on the prior study and is increased in  size. Additional areas of lobulated low-attenuation are noted within the mid and upper right kidney. This is present on the prior study. A 17 mm nonobstructing renal stone is again seen within the lower pole of the right kidney. Additional smaller renal versus vascular calcifications are noted within the mid right kidney. Mild right-sided perinephric inflammatory fat stranding is seen. A mild amount of contrast is seen within the dependent portion of a normal appearing urinary bladder. Stomach/Bowel: Stomach is within normal limits. The appendix is not identified. No evidence of bowel dilatation. Vascular/Lymphatic: Aortic atherosclerosis. And inferior vena cava filter is noted. No enlarged abdominal or pelvic lymph nodes. Reproductive: The prostate gland is surgically absent. Other: A 4.7 cm x 3.1 cm collection of fluid and air, with thin surrounding mildly hyperdense rim, is seen within the posterior perirectal region on the left. This represents a new finding. Musculoskeletal: Multilevel degenerative changes seen throughout the lumbar spine. IMPRESSION: 1. 4.7 cm x 3.1 cm left posterior perirectal abscess. 2. Cholelithiasis. 3. Interval increase in size of the suspected right subcapsular fluid collection since the prior exam with additional findings suggestive of right-sided acute pyelonephritis. MRI correlation is recommended. 4. Nonobstructing renal calculus within the right kidney. 5. Aortic atherosclerosis. Aortic Atherosclerosis (ICD10-I70.0). Electronically Signed   By: Virgina Norfolk M.D.   On: 05/15/2020 22:46      Assessment/Plan  Perirectal abscess surgery has evaluated and has given 2.5mg  K for reversal of Coumadin with potential I & D tomorrow. Last dose of Coumadin evening of 4/9.  Will hold dose for  tonight. sitz bath  Continue IV Zosyn  Anemia/Melena Suspect acute GI bleed- could be aggravated by constipation and had supratherapeutic INR up to 6 earlier this week Patient has  significant drop from hemoglobin of 16 on 3/22 down to Hgb 10.4.  He also endorses melena for several days.  obtain FOBT  start PPI infusion  Last Colonoscopy on 2012 by Dr. Lajoyce Corners that was normal  Need GI consult   Persistent pyelonephritis Patient had a UTI about a month ago placed on Bactrim and later reevaluated in ED and placed on Cefpodoxime. No growth in UA at that time.  CT abdomen now shows worsening- continue IV Zosyn and obtain UA with culture If not improved, consider MRI of the abdomen   Hx of persistent atrial fibrillation CHADVASc score of 5 warfarin on hold   Hx of recurrent DVT/PE with supratherapeutic INR Patient with history of provoked DVT following prostatectomy with IVC placement afterwards.  He then later developed bacteremia and developed pulmonary embolism and DVT and was placed on warfarin. Admission INR of 3.3.  Goal of 2-3. Patient's PE and DVT are remote so okay to hold Coumadin.  However would recommend resuming as soon as possible after I&D and GI bleed is ruled out   History of heart block s/p pacemaker Stable  Hx of aortic stenosis s/p TAVR Appears to have a bioprosthetic valve  CAD s/p stent  stable and asymptomatic  CKD stage III Creatinine stable  OSA Continue CPAP  DVT prophylaxis:.SCDs-subtherapeutic INR on Coumadin Code Status: Full Family Communication: Plan discussed with patient and wife at bedside  disposition Plan: Home with observation Consults called: Surgery Admission status: Observation Level of care: Telemetry  Status is: Observation  The patient remains OBS appropriate and will d/c before 2 midnights.  Dispo: The patient is from: Home              Anticipated d/c is to: Home              Patient currently is not medically stable to d/c.   Difficult to place patient No         Orene Desanctis DO [facility]   If 7PM-7AM, please contact night-coverage [email or web address]   05/15/2020, 11:42 PM

## 2020-05-15 NOTE — ED Triage Notes (Signed)
Pt arrives via EMS from home. Pt reports painful BM with rectal bleeding, spots of bright red blood. Last Wednesday pt was constipated, followed by diarrhea and rectal pain and bleeding. A & O times 4.

## 2020-05-15 NOTE — Consult Note (Incomplete)
[name]  04-Oct-1931 [id number]  CARE TEAM:  PCP: System, Provider Not In  Outpatient Care Team: Patient Care Team: System, Provider Not In as PCP - General Bettina Gavia Hilton Cork, MD as PCP - Cardiology (Cardiology) Jacolyn Reedy, MD as Consulting Physician (Cardiology)  Inpatient Treatment Team: Treatment Team: Attending Provider: Lacretia Leigh, MD; Registered Nurse: Rafael Bihari, RN; Technician: Hezzie Bump, EMT; Rounding Team: Jackelyn Knife, MD; Consulting Physician: Edison Pace, Md, MD   This patient is a 87 y.o.male who presents today for surgical evaluation at the request of Vivi Martens, MD.   Chief complaint / Reason for evaluation: Perirectal abscess  ***   Assessment  [name]  87 y.o. male       Problem List:  Active Problems:   Hypertensive heart disease   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)   Gastro-esophageal reflux disease without esophagitis   Long term (current) use of anticoagulants   Persistent atrial fibrillation (HCC)   CKD (chronic kidney disease), stage III (HCC)   S/P TAVR (transcatheter aortic valve replacement)   OSA on CPAP   CAD (coronary artery disease), native coronary artery   Atrial fibrillation, persistent ([place])   Perirectal abscess   Pyelonephritis of right kidney   Presence of IVC filter   Constipation, chronic   Cysts of right kidney   Perirectal abscess   Possible pyelonephritis in the cystic right kidney  Plan:  IV antibiotics.  Sitz bath soaks.  Most likely require incision and drainage given the size and location  He is fully anticoagulated with INR 3.3 and anemic.  Do not think it is wise to try to do this tonight overnight.  Give vitamin K and hold warfarin.  See if we can get his INR under better control.  If not improved may need more vitamin K and possible plasma to correct.  Most likely due to incision and drainage in the morning if INR better  corrected.  Medical admission possible cardiac reevaluation if needed.  Chronic kidney disease.  Celiac oliguric.  Episode most likely triggered from attempted enema/disimpaction.  Try to avoid in the future.  oral bowel regimen to avoid future episodes of constipation.  -VTE prophylaxis- SCDs, etc -mobilize as tolerated to help recovery  *** minutes spent in review, evaluation, examination, counseling, and coordination of care.  More than 50% of that time was spent in counseling.  Adin Hector, MD, FACS, MASCRS  Esophageal, Gastrointestinal & Colorectal Surgery Robotic and Minimally Invasive Surgery [facility] [address] [place], [place] [address] [phone] Fax [phone] Main/Paging  CONTACT INFORMATION: Weekday (9AM-5PM) concerns: Call [facility] main office at [phone] Weeknight (5PM-9AM) or Weekend/Holiday concerns: Check [email or web address] for General Surgery [facility] coverage (Please, do not use SecureChat as it is not reliable communication to operating surgeons for immediate patient care)      05/15/2020      Past Medical History:  Diagnosis Date  . Aortic valve stenosis 04/05/2015   Formatting of this note might be different from the original. Overview:  ECHO 5/15 Formatting of this note might be different from the original. Overview:  Overview:  ECHO 5/15  . Arthritis    "knees, right shoulder" (03/14/2017)  . Atrial fibrillation, persistent ([place]) 09/19/2014  . CAD (coronary artery disease), native coronary artery    Cath 2011 LHC (08/2009):~ Proximal LAD 30%, mid to distal LAD 25%, ostial small D1 75% mid AV  groove circumflex 99% been subtotal stenosis, proximal to mid RCA 25-30%, mid RCA 30%, mid PDA 30%, EF 50% with inferior hypokinesis.;    July 2011  PCI and DES to circumflex Dr. Olevia Perches   ETT-Myoview (06/2013):  Inferolateral scar, mild peri-infarct ischemia, EF 40%; Intermediate Risk   ECHO EF 55% 03/2013   . CAD (coronary artery  disease), native coronary artery 04/05/2015   Cath 2011 LHC (08/2009):~ Proximal LAD 30%, mid to distal LAD 25%, ostial small D1 75% mid AV groove circumflex 99% been subtotal stenosis, proximal to mid RCA 25-30%, mid RCA 30%, mid PDA 30%, EF 50% with inferior hypokinesis.;    July 2011  PCI and DES to circumflex Dr. Olevia Perches   ETT-Myoview (06/2013):  Inferolateral scar, mild peri-infarct ischemia, EF 40%; Intermediate Risk   ECHO EF 55% 03/2013    . Cardiac pacemaker in situ 03/14/2017   Biventricular St. Jude inserted 03/14/17 Dr. Lovena Le for second degree heart block   . Chronic combined systolic and diastolic heart failure ([place]) 11/19/2017  . CKD (chronic kidney disease), stage III ([place])   . Gastro-esophageal reflux disease without esophagitis 09/24/2009  . GERD   . History of infection of prosthetic knee 04/05/2015   Treated with debridement and irrigation followed by 6 months of triple antibiotics  . History of kidney stones   . History of peptic ulcer 1970s?  Marland Kitchen History of prostate cancer    Radical prostatectomy in 2006 Dr. Terance Hart   . Hypertensive heart disease   . Internal hemorrhoids 09/11/2018  . Long term (current) use of anticoagulants 10/06/2011  . Mixed hyperlipidemia   . Moderate persistent asthma without complication 2/58/5277  . Noise effect on both inner ears 08/14/2018  . Obesity (BMI 30-39.9)   . OSA on CPAP   . OSA treated with BiPAP 11/15/2015  . Persistent atrial fibrillation ([place]) 09/19/2014   CHA2DS2VASC score 5  Cardioversion 10/08/2014 was on amiodarone until 2018   . 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)    Initial DVT in 2006 after prostate surgery and had [place] filter placed Bilateral  PE 2013 and placed back on warfarin DVT of right subclavian vein on doppler 10/2012 at time of knee infection   . Personal history of malignant neoplasm of prostate    Radical prostatectomy in 2006 Dr. Terance Hart    . Presbycusis of both ears 08/14/2018  .  Severe aortic stenosis   . Severe aortic stenosis 03/18/2018  . Urinary incontinence    MULTIPLE BLADDER SURGERIES - STATES NO URINARY SPHINCTER - PT'S UROLOGIST IS AT [facility]- DR. PETERSON  ( LAST VISIT WAS 09/15/11 )    Past Surgical History:  Procedure Laterality Date  . APPENDECTOMY    . BI-VENTRICULAR PACEMAKER INSERTION (CRT-P)  03/14/2017  . BIV PACEMAKER INSERTION CRT-P N/A 03/14/2017   Procedure: BIV PACEMAKER INSERTION CRT-P;  Surgeon: Evans Lance, MD;  Location: [facility];  Service: Cardiovascular;  Laterality: N/A;  . CARDIOVERSION N/A 10/08/2014   Procedure: CARDIOVERSION;  Surgeon: Jacolyn Reedy, MD;  Location: [facility];  Service: Cardiovascular;  Laterality: N/A;  . CATARACT EXTRACTION W/ INTRAOCULAR LENS  IMPLANT, BILATERAL Bilateral   . CORONARY ANGIOPLASTY WITH STENT PLACEMENT  08/25/2009   DES-mid LCx 08/2009; 30% pLAD, 25% m/dLAD, 75% ostial D1, 99% mLCx s/p DES, 25-30% p/mRCA, 40% mRCA, 30% mPDA stenoses; LVEF 50%, inf hypokinesis  . CORONARY STENT INTERVENTION N/A 03/18/2018   Procedure: CORONARY STENT INTERVENTION;  Surgeon: Burt Knack,  Legrand Como, MD;  Location: [facility];  Service: Cardiovascular;  Laterality: N/A;  . EXCISIONAL HEMORRHOIDECTOMY    . I & D KNEE WITH POLY EXCHANGE Left 10/09/2012   Procedure: IRRIGATION AND DEBRIDEMENT LEFT KNEE WITH POLY REVISION;  Surgeon: Gearlean Alf, MD;  Location: [facility];  Service: Orthopedics;  Laterality: Left;  . INGUINAL HERNIA REPAIR Right   . INTRAOPERATIVE TRANSTHORACIC ECHOCARDIOGRAM  03/19/2018   Procedure: Intraoperative Transthoracic Echocardiogram;  Surgeon: Sherren Mocha, MD;  Location: [facility];  Service: Open Heart Surgery;;  . JOINT REPLACEMENT    . KNEE ARTHROTOMY Left 10/09/2012   Procedure: LEFT KNEE ARTHROTOMY;  Surgeon: Gearlean Alf, MD;  Location: [facility];  Service: Orthopedics;  Laterality: Left;  . LEFT HEART CATH AND CORONARY ANGIOGRAPHY N/A 02/28/2018   Procedure: LEFT HEART CATH AND  CORONARY ANGIOGRAPHY;  Surgeon: Sherren Mocha, MD;  Location: [facility];  Service: Cardiovascular;  Laterality: N/A;  . LEFT HEART CATH AND CORONARY ANGIOGRAPHY N/A 09/12/2019   Procedure: LEFT HEART CATH AND CORONARY ANGIOGRAPHY;  Surgeon: Sherren Mocha, MD;  Location: [facility];  Service: Cardiovascular;  Laterality: N/A;  . PROSTATECTOMY  04/25/2004  . REPLACEMENT TOTAL KNEE Bilateral   . SKIN BIOPSY     "off nose; wasn't cancer; it was tested" (03/14/2017)  . TRANSCATHETER AORTIC VALVE REPLACEMENT, TRANSFEMORAL N/A 03/19/2018   Procedure: TRANSCATHETER AORTIC VALVE REPLACEMENT, TRANSFEMORAL;  Surgeon: Sherren Mocha, MD;  Location: [facility];  Service: Open Heart Surgery;  Laterality: N/A;  . Uretheral implants     multiple for incontinence  . VENA CAVA FILTER PLACEMENT      Social History   Socioeconomic History  . Marital status: Married    Spouse name: Not on file  . Number of children: 3  . Years of education: Not on file  . Highest education level: Not on file  Occupational History  . Occupation: Retired Therapist, nutritional  Tobacco Use  . Smoking status: Former Smoker    Packs/day: 1.00    Years: 10.00    Pack years: 10.00    Types: Cigarettes    Quit date: 04/27/1961    Years since quitting: 59.0  . Smokeless tobacco: Never Used  Vaping Use  . Vaping Use: Never used  Substance and Sexual Activity  . Alcohol use: No    Alcohol/week: 0.0 standard drinks  . Drug use: No  . Sexual activity: Not on file  Other Topics Concern  . Not on file  Social History Narrative  . Not on file   Social Determinants of Health   Financial Resource Strain: Not on file  Food Insecurity: Not on file  Transportation Needs: Not on file  Physical Activity: Not on file  Stress: Not on file  Social Connections: Not on file  Intimate Partner Violence: Not on file    Family History  Problem Relation Age of Onset  . Melanoma Father   . Melanoma Brother     Current  Facility-Administered Medications  Medication Dose Route Frequency Provider Last Rate Last Admin  . 0.9 %  sodium chloride infusion   Intravenous Continuous Lacretia Leigh, MD 10 mL/hr at 05/15/20 1958 New Bag at 05/15/20 1958  . feeding supplement (ENSURE SURGERY) liquid 237 mL  237 mL Oral BID BM Michael Boston, MD      . phytonadione (VITAMIN K) tablet 2.5 mg  2.5 mg Oral Once Michael Boston, MD      . piperacillin-tazobactam (ZOSYN) IVPB 3.375 g  3.375 g Intravenous Once Lacretia Leigh, MD      . polyethylene glycol (MIRALAX / GLYCOLAX) packet 34 g  34 g Oral BID Michael Boston, MD       Current Outpatient Medications  Medication Sig Dispense Refill  . acetaminophen (TYLENOL) 500 MG tablet Take 500 mg by mouth at bedtime.     Marland Kitchen amLODipine (NORVASC) 2.5 MG tablet Take 1 tablet (2.5 mg total) by mouth daily. 90 tablet 3  . Ascorbic Acid (VITAMIN C) 1000 MG tablet Take 1,000 mg by mouth daily.    . Biotin 2500 MCG CAPS Take 2,500 mcg by mouth daily.    . cholecalciferol (VITAMIN D) 1000 UNITS tablet Take 1,000 Units by mouth daily.    Marland Kitchen docusate sodium (COLACE) 50 MG capsule Take 50 mg by mouth daily.    . furosemide (LASIX) 20 MG tablet Take 20 mg by mouth daily.    . Glucosamine HCl (GLUCOSAMINE PO) Take 2,000 mg by mouth daily.    . isosorbide mononitrate (IMDUR) 30 MG 24 hr tablet Take 0.5 tablets (15 mg total) by mouth daily. 45 tablet 3  . Multiple Vitamin (MULTIVITAMIN) capsule Take 1 capsule by mouth daily.     . nitroGLYCERIN (NITROSTAT) 0.4 MG SL tablet Place 1 tablet (0.4 mg total) under the tongue every 5 (five) minutes as needed. 25 tablet 3  . Omega-3 Fatty Acids (FISH OIL) 1000 MG CAPS Take 1,000 mg by mouth daily.    Marland Kitchen omeprazole (PRILOSEC) 20 MG capsule Take 1 capsule (20 mg total) by mouth daily. 90 capsule 3  . pravastatin (PRAVACHOL) 20 MG tablet TAKE ONE TABLET BY MOUTH ONCE DAILY (Patient taking differently: Take 20 mg by mouth daily.) 90 tablet 0  . vitamin B-12  (CYANOCOBALAMIN) 500 MCG tablet Take 500 mcg by mouth daily.    Marland Kitchen warfarin (COUMADIN) 2.5 MG tablet Take 1 tablet daily or as directed by [facility] (Patient taking differently: Take 2.5 mg by mouth daily.) 90 tablet 1  . cefpodoxime (VANTIN) 200 MG tablet Take 1 tablet (200 mg total) by mouth 2 (two) times daily. (Patient not taking: No sig reported) 14 tablet 0     Allergies  Allergen Reactions  . Darifenacin Nausea Only and Other (See Comments)    dry mouth and dizziness ENABLEX Dizziness Pt denies  . Codeine Other (See Comments)  . Sulfamethoxazole-Trimethoprim Other (See Comments)  . Lisinopril Cough    Pt denies    ROS:   All other systems reviewed & are negative except per HPI or as noted below: Constitutional:  ***No fevers, chills, sweats.  Weight stable Eyes:  No vision changes, No discharge HENT:  No sore throats, nasal drainage Lymph: No neck swelling, No bruising easily Pulmonary:  No cough, productive sputum CV: No orthopnea, PND  Patient walks *** minutes for about *** miles without difficulty.  No exertional chest/neck/shoulder/arm pain. GI: *** No personal nor family history of GI/colon cancer, inflammatory bowel disease, irritable bowel syndrome, allergy such as Celiac Sprue, dietary/dairy problems, colitis, ulcers nor gastritis.  No recent sick contacts/gastroenteritis.  No travel outside the country.  No changes in diet. Renal: No UTIs, No hematuria Genital:  No drainage, bleeding, masses Musculoskeletal: No severe joint pain.  Good ROM major joints Skin:  No sores or lesions.  No rashes Heme/Lymph:  No easy bleeding.  No swollen lymph nodes Neuro: No focal weakness/numbness.  No seizures Psych: No suicidal ideation.  No hallucinations  BP 134/60   Pulse 64  Temp 98.3 F (36.8 C) (Oral)   Resp (!) 23   Ht 6' (1.829 m)   Wt 97.1 kg   SpO2 100%   BMI 29.02 kg/m   Physical Exam: Constitutional: Not cachectic.  Hygeine adequate.  Vitals signs as  above.   Eyes: Pupils reactive, normal extraocular movements. Sclera nonicteric Neuro: CN II-XII intact.  No major focal sensory defects.  No major motor deficits. Lymph: No head/neck/groin lymphadenopathy Psych:  No severe agitation.  No severe anxiety.  Judgment & insight {Blank single:19197::"Impaired","Adequate"}, Oriented x{Blank single:19197::"1","2","3","4"}, HENT: Normocephalic, Mucus membranes moist.  No thrush.   Neck: Supple, No tracheal deviation.  No obvious thyromegaly Chest: No pain to chest wall compression.  Good respiratory excursion.  No audible wheezing CV:  Pulses intact.  *** Regular rhythm.  No major extremity edema Abdomen:  {Blank single:19197::"Rigid,","Mostly firm","Somewhat firm","Soft"}.  {Blank single:19197::"Very distended","Moderately distended","Mildly distended","Nondistended"}.  {Blank single:19197::"Tenderness at ***","Mildly tender at incisions only","Nontender"}. No incarcerated hernias.  No hepatomegaly.  No splenomegaly Gen:  No inguinal hernias.  No inguinal lymphadenopathy.   Ext: No obvious deformity or contracture no significant edema.  No cyanosis Skin: No major subcutaneous nodules.  Warm and dry Musculoskeletal: Severe joint rigidity {Blank single:19197::"Noted at ***","not present"}.  No obvious clubbing.  No digital petechiae.     Results:   Labs: Results for orders placed or performed during the hospital encounter of 05/15/20 (from the past 48 hour(s))  CBC with Differential/Platelet     Status: Abnormal   Collection Time: 05/15/20  8:00 PM  Result Value Ref Range   WBC 11.8 (H) 4.0 - 10.5 K/uL   RBC 4.08 (L) 4.22 - 5.81 MIL/uL   Hemoglobin 10.4 (L) 13.0 - 17.0 g/dL   HCT 32.8 (L) 39.0 - 52.0 %   MCV 80.4 80.0 - 100.0 fL   MCH 25.5 (L) 26.0 - 34.0 pg   MCHC 31.7 30.0 - 36.0 g/dL   RDW 18.4 (H) 11.5 - 15.5 %   Platelets 283 150 - 400 K/uL   nRBC 0.0 0.0 - 0.2 %   Neutrophils Relative % 76 %   Neutro Abs 8.9 (H) 1.7 - 7.7 K/uL    Lymphocytes Relative 13 %   Lymphs Abs 1.5 0.7 - 4.0 K/uL   Monocytes Relative 8 %   Monocytes Absolute 1.0 0.1 - 1.0 K/uL   Eosinophils Relative 2 %   Eosinophils Absolute 0.2 0.0 - 0.5 K/uL   Basophils Relative 0 %   Basophils Absolute 0.0 0.0 - 0.1 K/uL   Immature Granulocytes 1 %   Abs Immature Granulocytes 0.09 (H) 0.00 - 0.07 K/uL    Comment: Performed at [facility], [place] [address], [place], [place] [address]  Basic metabolic panel     Status: Abnormal   Collection Time: 05/15/20  8:00 PM  Result Value Ref Range   Sodium 138 135 - 145 mmol/L   Potassium 4.2 3.5 - 5.1 mmol/L   Chloride 104 98 - 111 mmol/L   CO2 26 22 - 32 mmol/L   Glucose, Bld 108 (H) 70 - 99 mg/dL    Comment: Glucose reference range applies only to samples taken after fasting for at least 8 hours.   BUN 27 (H) 8 - 23 mg/dL   Creatinine, Ser 1.53 (H) 0.61 - 1.24 mg/dL   Calcium 9.3 8.9 - 10.3 mg/dL   GFR, Estimated 44 (L) >60 mL/min    Comment: (NOTE) Calculated using the CKD-EPI Creatinine Equation (2021)  Anion gap 8 5 - 15    Comment: Performed at [facility], [place] [address], [place], [place] [address]  Protime-INR     Status: Abnormal   Collection Time: 05/15/20  8:00 PM  Result Value Ref Range   Prothrombin Time 32.6 (H) 11.4 - 15.2 seconds   INR 3.3 (H) 0.8 - 1.2    Comment: (NOTE) INR goal varies based on device and disease states. Performed at [facility], [place] [address], [place], [place] [address]   Type and screen     Status: None   Collection Time: 05/15/20  8:00 PM  Result Value Ref Range   ABO/RH(D) A POS    Antibody Screen NEG    Sample Expiration      05/18/2020,2359 Performed at [name], [place] [address] [place],  [address]     Imaging / Studies: CT Abdomen Pelvis W Contrast  Result Date: 05/15/2020 CLINICAL DATA:  Painful bowel movement with rectal bleeding. EXAM: CT ABDOMEN AND PELVIS  WITH CONTRAST TECHNIQUE: Multidetector CT imaging of the abdomen and pelvis was performed using the standard protocol following bolus administration of intravenous contrast. CONTRAST:  33mL OMNIPAQUE IOHEXOL 300 MG/ML  SOLN COMPARISON:  April 22, 2020 FINDINGS: Lower chest: Mild areas of atelectasis are seen within the bilateral lung bases. There is an artificial aortic valve. A small pericardial effusion is noted. Hepatobiliary: No focal liver abnormality is seen. Subcentimeter gallstones are seen within the lumen of an otherwise normal-appearing gallbladder. Pancreas: Unremarkable. No pancreatic ductal dilatation or surrounding inflammatory changes. Spleen: Normal in size without focal abnormality. Adrenals/Urinary Tract: Adrenal glands are unremarkable. A 1.5 cm x 1.3 cm cyst is seen within the lateral aspect of the mid left kidney. Renal cortical thinning is again seen on the right. A 7.1 cm x 4.7 cm lobulated area of low attenuation (approximately 22.12 Hounsfield units) is seen within the anterior aspect of the right kidney. This is seen as an area of subcapsular fluid on the prior study and is increased in size. Additional areas of lobulated low-attenuation are noted within the mid and upper right kidney. This is present on the prior study. A 17 mm nonobstructing renal stone is again seen within the lower pole of the right kidney. Additional smaller renal versus vascular calcifications are noted within the mid right kidney. Mild right-sided perinephric inflammatory fat stranding is seen. A mild amount of contrast is seen within the dependent portion of a normal appearing urinary bladder. Stomach/Bowel: Stomach is within normal limits. The appendix is not identified. No evidence of bowel dilatation. Vascular/Lymphatic: Aortic atherosclerosis. And inferior vena cava filter is noted. No enlarged abdominal or pelvic lymph nodes. Reproductive: The prostate gland is surgically absent. Other: A 4.7 cm x 3.1 cm  collection of fluid and air, with thin surrounding mildly hyperdense rim, is seen within the posterior perirectal region on the left. This represents a new finding. Musculoskeletal: Multilevel degenerative changes seen throughout the lumbar spine. IMPRESSION: 1. 4.7 cm x 3.1 cm left posterior perirectal abscess. 2. Cholelithiasis. 3. Interval increase in size of the suspected right subcapsular fluid collection since the prior exam with additional findings suggestive of right-sided acute pyelonephritis. MRI correlation is recommended. 4. Nonobstructing renal calculus within the right kidney. 5. Aortic atherosclerosis. Aortic Atherosclerosis (ICD10-I70.0). Electronically Signed   By: Virgina Norfolk M.D.   On: 05/15/2020 22:46   DG Chest Port 1 View  Result Date: 04/22/2020 CLINICAL DATA:  Fever, sepsis EXAM: PORTABLE  CHEST 1 VIEW COMPARISON:  08/24/2019 FINDINGS: Single frontal view of the chest demonstrates a stable cardiac silhouette. Multi lead pacer and aortic valve prosthesis unchanged. No airspace disease, effusion, or pneumothorax. IMPRESSION: 1. No acute intrathoracic process.  Stable exam. Electronically Signed   By: Randa Ngo M.D.   On: 04/22/2020 22:42   CT Renal Stone Study  Result Date: 04/22/2020 CLINICAL DATA:  87 year old with flank pain. Fever. Patient reports recently passed kidney stone. EXAM: CT ABDOMEN AND PELVIS WITHOUT CONTRAST TECHNIQUE: Multidetector CT imaging of the abdomen and pelvis was performed following the standard protocol without IV contrast. COMPARISON:  CT 03/27/2018 FINDINGS: Lower chest: Cardiomegaly. Pacemaker partially included. Prostatic aortic valve. Coronary artery calcifications. Minimal subpleural reticulation at the lung bases. No pleural fluid. Hepatobiliary: No focal hepatic abnormality on noncontrast exam. Intraluminal gallstones without pericholecystic fat stranding. Mild biliary prominence is likely normal for age. Pancreas: Mild fatty atrophy of the  head and uncinate process. No ductal dilatation or inflammation. Spleen: Normal in size without focal abnormality. Adrenals/Urinary Tract: No adrenal nodule. Moderate to severe right perinephric edema. There is stranding in the region of the renal pelvis. No definite hydronephrosis. There is thinning of the right renal parenchyma with upper pole cyst. Nonobstructing stone measuring 17 mm in the lower right kidney. There are additional punctate nonobstructing stones in the mid kidney versus vascular calcifications. Small subcapsular collection about the anterior mid kidney, series 2, image 37, measures approximately 12 mm in thickness. There is no intrarenal renal collecting system air. The right ureter is decompressed, no ureteral stone. Stranding extends from the renal pelvis to the proximal ureter. There is no left hydronephrosis. No left renal calculi. Small left renal cysts. Minimal left perinephric edema. Left ureter is decompressed without stone along the course. Urinary bladder is completely empty and not well assessed. There is likely mild perivesicular fat stranding. Stomach/Bowel: Decompressed stomach. No small bowel obstruction or inflammation. Mild fecalization of small bowel contents. Appendix not visualized, appendectomy per history. Moderate colonic stool burden. There is colonic redundancy. No colonic wall thickening or inflammation. No significant diverticular disease. Vascular/Lymphatic: Advanced aortic atherosclerosis. No aortic aneurysm. Infrarenal IVC filter in place. 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 Small retroperitoneal nodes are not enlarged by size criteria. No pelvic adenopathy. Reproductive: Prostatectomy. Other: Small amount of free fluid in the pelvis. No upper abdominal ascites. No free air. Fat in both inguinal canals. Musculoskeletal: Scoliosis and advanced degenerative change in the spine. There are no acute or suspicious osseous abnormalities. IMPRESSION: 1. Moderate to severe right  perinephric edema. Stranding in the region of the renal pelvis and proximal ureter. No frank hydronephrosis. There are nonobstructing stones in the right kidney but no obstructing or ureteral calculi. Findings may be due to recently passed stone or urinary tract infection. 2. Small subcapsular collection about the anterior mid right kidney measuring up to 12 mm in thickness. This may represent a subcapsular hematoma, however sterility is indeterminate. 3. Cholelithiasis without gallbladder inflammation. Aortic Atherosclerosis (ICD10-I70.0). Electronically Signed   By: Keith Rake M.D.   On: 04/22/2020 23:50    Medications / Allergies: per chart  Antibiotics: Anti-infectives (From admission, onward)   Start     Dose/Rate Route Frequency Ordered Stop   05/15/20 2315  piperacillin-tazobactam (ZOSYN) IVPB 3.375 g        3.375 g 100 mL/hr over 30 Minutes Intravenous  Once 05/15/20 2311          Note: Portions of this report may have been transcribed using  voice recognition software. Every effort was made to ensure accuracy; however, inadvertent computerized transcription errors may be present.   Any transcriptional errors that result from this process are unintentional.    Adin Hector, MD, FACS, MASCRS  Esophageal, Gastrointestinal & Colorectal Surgery Robotic and Minimally Invasive Surgery [facility] [address] [place], [place] [address] [phone] Fax [phone] Main/Paging  CONTACT INFORMATION: Weekday (9AM-5PM) concerns: Call [facility] main office at [phone] Weeknight (5PM-9AM) or Weekend/Holiday concerns: Check [email or web address] for General Surgery [facility] coverage (Please, do not use SecureChat as it is not reliable communication to operating surgeons for immediate patient care)      05/15/2020  11:31 PM

## 2020-05-16 ENCOUNTER — Observation Stay (HOSPITAL_COMMUNITY): Payer: Medicare Other | Admitting: Anesthesiology

## 2020-05-16 ENCOUNTER — Encounter (HOSPITAL_COMMUNITY)
Admission: EM | Disposition: A | Discharge status: Home Health | Payer: Self-pay | Source: Home / Self Care | Attending: Internal Medicine

## 2020-05-16 ENCOUNTER — Encounter (HOSPITAL_COMMUNITY): Payer: Self-pay | Admitting: Family Medicine

## 2020-05-16 ENCOUNTER — Other Ambulatory Visit: Payer: Self-pay

## 2020-05-16 DIAGNOSIS — R159 Full incontinence of feces: Secondary | ICD-10-CM | POA: Diagnosis not present

## 2020-05-16 DIAGNOSIS — Z7189 Other specified counseling: Secondary | ICD-10-CM | POA: Diagnosis not present

## 2020-05-16 DIAGNOSIS — N2889 Other specified disorders of kidney and ureter: Secondary | ICD-10-CM | POA: Diagnosis not present

## 2020-05-16 DIAGNOSIS — I110 Hypertensive heart disease with heart failure: Secondary | ICD-10-CM | POA: Diagnosis not present

## 2020-05-16 DIAGNOSIS — Z7901 Long term (current) use of anticoagulants: Secondary | ICD-10-CM | POA: Diagnosis not present

## 2020-05-16 DIAGNOSIS — Z515 Encounter for palliative care: Secondary | ICD-10-CM | POA: Diagnosis not present

## 2020-05-16 DIAGNOSIS — G4733 Obstructive sleep apnea (adult) (pediatric): Secondary | ICD-10-CM | POA: Diagnosis not present

## 2020-05-16 DIAGNOSIS — B9620 Unspecified Escherichia coli [E. coli] as the cause of diseases classified elsewhere: Secondary | ICD-10-CM | POA: Diagnosis not present

## 2020-05-16 DIAGNOSIS — K8020 Calculus of gallbladder without cholecystitis without obstruction: Secondary | ICD-10-CM | POA: Diagnosis not present

## 2020-05-16 DIAGNOSIS — I459 Conduction disorder, unspecified: Secondary | ICD-10-CM | POA: Diagnosis not present

## 2020-05-16 DIAGNOSIS — H9113 Presbycusis, bilateral: Secondary | ICD-10-CM | POA: Diagnosis not present

## 2020-05-16 DIAGNOSIS — N1832 Chronic kidney disease, stage 3b: Secondary | ICD-10-CM | POA: Diagnosis not present

## 2020-05-16 DIAGNOSIS — A498 Other bacterial infections of unspecified site: Secondary | ICD-10-CM | POA: Diagnosis not present

## 2020-05-16 DIAGNOSIS — N151 Renal and perinephric abscess: Secondary | ICD-10-CM | POA: Diagnosis not present

## 2020-05-16 DIAGNOSIS — K648 Other hemorrhoids: Secondary | ICD-10-CM | POA: Diagnosis not present

## 2020-05-16 DIAGNOSIS — N12 Tubulo-interstitial nephritis, not specified as acute or chronic: Secondary | ICD-10-CM | POA: Diagnosis not present

## 2020-05-16 DIAGNOSIS — I5042 Chronic combined systolic (congestive) and diastolic (congestive) heart failure: Secondary | ICD-10-CM | POA: Diagnosis not present

## 2020-05-16 DIAGNOSIS — R531 Weakness: Secondary | ICD-10-CM | POA: Diagnosis not present

## 2020-05-16 DIAGNOSIS — Z952 Presence of prosthetic heart valve: Secondary | ICD-10-CM | POA: Diagnosis not present

## 2020-05-16 DIAGNOSIS — I130 Hypertensive heart and chronic kidney disease with heart failure and stage 1 through stage 4 chronic kidney disease, or unspecified chronic kidney disease: Secondary | ICD-10-CM | POA: Diagnosis not present

## 2020-05-16 DIAGNOSIS — K5909 Other constipation: Secondary | ICD-10-CM

## 2020-05-16 DIAGNOSIS — Z978 Presence of other specified devices: Secondary | ICD-10-CM | POA: Diagnosis not present

## 2020-05-16 DIAGNOSIS — Z66 Do not resuscitate: Secondary | ICD-10-CM | POA: Diagnosis not present

## 2020-05-16 DIAGNOSIS — M150 Primary generalized (osteo)arthritis: Secondary | ICD-10-CM | POA: Diagnosis not present

## 2020-05-16 DIAGNOSIS — J4540 Moderate persistent asthma, uncomplicated: Secondary | ICD-10-CM | POA: Diagnosis present

## 2020-05-16 DIAGNOSIS — I517 Cardiomegaly: Secondary | ICD-10-CM | POA: Diagnosis not present

## 2020-05-16 DIAGNOSIS — I25118 Atherosclerotic heart disease of native coronary artery with other forms of angina pectoris: Secondary | ICD-10-CM | POA: Diagnosis not present

## 2020-05-16 DIAGNOSIS — Z86718 Personal history of other venous thrombosis and embolism: Secondary | ICD-10-CM | POA: Diagnosis not present

## 2020-05-16 DIAGNOSIS — I4821 Permanent atrial fibrillation: Secondary | ICD-10-CM | POA: Diagnosis not present

## 2020-05-16 DIAGNOSIS — Z6829 Body mass index (BMI) 29.0-29.9, adult: Secondary | ICD-10-CM | POA: Diagnosis not present

## 2020-05-16 DIAGNOSIS — A499 Bacterial infection, unspecified: Secondary | ICD-10-CM | POA: Diagnosis not present

## 2020-05-16 DIAGNOSIS — N1831 Chronic kidney disease, stage 3a: Secondary | ICD-10-CM | POA: Diagnosis not present

## 2020-05-16 DIAGNOSIS — Z8546 Personal history of malignant neoplasm of prostate: Secondary | ICD-10-CM | POA: Diagnosis not present

## 2020-05-16 DIAGNOSIS — N10 Acute tubulo-interstitial nephritis: Secondary | ICD-10-CM | POA: Diagnosis not present

## 2020-05-16 DIAGNOSIS — K219 Gastro-esophageal reflux disease without esophagitis: Secondary | ICD-10-CM | POA: Diagnosis not present

## 2020-05-16 DIAGNOSIS — D62 Acute posthemorrhagic anemia: Secondary | ICD-10-CM | POA: Diagnosis not present

## 2020-05-16 DIAGNOSIS — I350 Nonrheumatic aortic (valve) stenosis: Secondary | ICD-10-CM | POA: Diagnosis present

## 2020-05-16 DIAGNOSIS — N200 Calculus of kidney: Secondary | ICD-10-CM | POA: Diagnosis not present

## 2020-05-16 DIAGNOSIS — I252 Old myocardial infarction: Secondary | ICD-10-CM | POA: Diagnosis not present

## 2020-05-16 DIAGNOSIS — E663 Overweight: Secondary | ICD-10-CM | POA: Diagnosis present

## 2020-05-16 DIAGNOSIS — R32 Unspecified urinary incontinence: Secondary | ICD-10-CM | POA: Diagnosis not present

## 2020-05-16 DIAGNOSIS — M25572 Pain in left ankle and joints of left foot: Secondary | ICD-10-CM | POA: Diagnosis not present

## 2020-05-16 DIAGNOSIS — Z95828 Presence of other vascular implants and grafts: Secondary | ICD-10-CM | POA: Diagnosis not present

## 2020-05-16 DIAGNOSIS — N183 Chronic kidney disease, stage 3 unspecified: Secondary | ICD-10-CM | POA: Diagnosis not present

## 2020-05-16 DIAGNOSIS — D6859 Other primary thrombophilia: Secondary | ICD-10-CM | POA: Diagnosis not present

## 2020-05-16 DIAGNOSIS — K921 Melena: Secondary | ICD-10-CM | POA: Diagnosis not present

## 2020-05-16 DIAGNOSIS — I4819 Other persistent atrial fibrillation: Secondary | ICD-10-CM | POA: Diagnosis not present

## 2020-05-16 DIAGNOSIS — K611 Rectal abscess: Secondary | ICD-10-CM | POA: Diagnosis not present

## 2020-05-16 DIAGNOSIS — Z20822 Contact with and (suspected) exposure to covid-19: Secondary | ICD-10-CM | POA: Diagnosis not present

## 2020-05-16 DIAGNOSIS — K651 Peritoneal abscess: Secondary | ICD-10-CM | POA: Diagnosis not present

## 2020-05-16 DIAGNOSIS — Z961 Presence of intraocular lens: Secondary | ICD-10-CM | POA: Diagnosis present

## 2020-05-16 DIAGNOSIS — K5900 Constipation, unspecified: Secondary | ICD-10-CM | POA: Diagnosis not present

## 2020-05-16 DIAGNOSIS — E669 Obesity, unspecified: Secondary | ICD-10-CM | POA: Diagnosis not present

## 2020-05-16 DIAGNOSIS — N119 Chronic tubulo-interstitial nephritis, unspecified: Secondary | ICD-10-CM | POA: Diagnosis not present

## 2020-05-16 DIAGNOSIS — E782 Mixed hyperlipidemia: Secondary | ICD-10-CM | POA: Diagnosis present

## 2020-05-16 DIAGNOSIS — Z1612 Extended spectrum beta lactamase (ESBL) resistance: Secondary | ICD-10-CM | POA: Diagnosis not present

## 2020-05-16 DIAGNOSIS — Z9079 Acquired absence of other genital organ(s): Secondary | ICD-10-CM | POA: Diagnosis not present

## 2020-05-16 DIAGNOSIS — I2510 Atherosclerotic heart disease of native coronary artery without angina pectoris: Secondary | ICD-10-CM | POA: Diagnosis not present

## 2020-05-16 DIAGNOSIS — N281 Cyst of kidney, acquired: Secondary | ICD-10-CM | POA: Diagnosis not present

## 2020-05-16 HISTORY — PX: INCISION AND DRAINAGE ABSCESS: SHX5864

## 2020-05-16 LAB — CBC
HCT: 32.7 % — ABNORMAL LOW (ref 39.0–52.0)
Hemoglobin: 10.4 g/dL — ABNORMAL LOW (ref 13.0–17.0)
MCH: 25.9 pg — ABNORMAL LOW (ref 26.0–34.0)
MCHC: 31.8 g/dL (ref 30.0–36.0)
MCV: 81.5 fL (ref 80.0–100.0)
Platelets: 285 10*3/uL (ref 150–400)
RBC: 4.01 MIL/uL — ABNORMAL LOW (ref 4.22–5.81)
RDW: 18.3 % — ABNORMAL HIGH (ref 11.5–15.5)
WBC: 12.6 10*3/uL — ABNORMAL HIGH (ref 4.0–10.5)
nRBC: 0 % (ref 0.0–0.2)

## 2020-05-16 LAB — BASIC METABOLIC PANEL
Anion gap: 7 (ref 5–15)
BUN: 24 mg/dL — ABNORMAL HIGH (ref 8–23)
CO2: 26 mmol/L (ref 22–32)
Calcium: 8.9 mg/dL (ref 8.9–10.3)
Chloride: 103 mmol/L (ref 98–111)
Creatinine, Ser: 1.29 mg/dL — ABNORMAL HIGH (ref 0.61–1.24)
GFR, Estimated: 54 mL/min — ABNORMAL LOW (ref >60)
Glucose, Bld: 99 mg/dL (ref 70–99)
Potassium: 4.7 mmol/L (ref 3.5–5.1)
Sodium: 136 mmol/L (ref 135–145)

## 2020-05-16 LAB — RETICULOCYTES
Immature Retic Fract: 31.1 % — ABNORMAL HIGH (ref 2.3–15.9)
RBC.: 4.01 MIL/uL — ABNORMAL LOW (ref 4.22–5.81)
Retic Count, Absolute: 75.8 10*3/uL (ref 19.0–186.0)
Retic Ct Pct: 1.9 % (ref 0.4–3.1)

## 2020-05-16 LAB — IRON AND TIBC
Iron: 24 ug/dL — ABNORMAL LOW (ref 45–182)
Saturation Ratios: 15 % — ABNORMAL LOW (ref 17.9–39.5)
TIBC: 158 ug/dL — ABNORMAL LOW (ref 250–450)
UIBC: 134 ug/dL

## 2020-05-16 LAB — POC OCCULT BLOOD, ED: Fecal Occult Bld: POSITIVE — AB

## 2020-05-16 LAB — PROTIME-INR
INR: 3.4 — ABNORMAL HIGH (ref 0.8–1.2)
INR: 1.4 — ABNORMAL HIGH (ref 0.8–1.2)
Prothrombin Time: 33.4 seconds — ABNORMAL HIGH (ref 11.4–15.2)
Prothrombin Time: 16.9 seconds — ABNORMAL HIGH (ref 11.4–15.2)

## 2020-05-16 LAB — RESP PANEL BY RT-PCR (FLU A&B, COVID) ARPGX2
Influenza A by PCR: NEGATIVE
Influenza B by PCR: NEGATIVE
SARS Coronavirus 2 by RT PCR: NEGATIVE

## 2020-05-16 LAB — FERRITIN: Ferritin: 372 ng/mL — ABNORMAL HIGH (ref 24–336)

## 2020-05-16 LAB — FOLATE: Folate: 10 ng/mL (ref >5.9)

## 2020-05-16 LAB — VITAMIN B12: Vitamin B-12: 754 pg/mL (ref 180–914)

## 2020-05-16 LAB — PREALBUMIN: Prealbumin: 7.9 mg/dL — ABNORMAL LOW (ref 18–38)

## 2020-05-16 LAB — PHOSPHORUS: Phosphorus: 3.4 mg/dL (ref 2.5–4.6)

## 2020-05-16 SURGERY — INCISION AND DRAINAGE, ABSCESS
Anesthesia: Monitor Anesthesia Care

## 2020-05-16 MED ORDER — ONDANSETRON HCL 4 MG/2ML IJ SOLN
4.0000 mg | Freq: Once | INTRAMUSCULAR | Status: DC | PRN
Start: 2020-05-16 — End: 2020-05-16

## 2020-05-16 MED ORDER — SODIUM CHLORIDE 0.9 % IV SOLN
8.0000 mg/h | INTRAVENOUS | Status: AC
  Administered 2020-05-16 – 2020-05-18 (×5): 8 mg/h via INTRAVENOUS
  Filled 2020-05-16 (×9): qty 80

## 2020-05-16 MED ORDER — FENTANYL CITRATE (PF) 100 MCG/2ML IJ SOLN
INTRAMUSCULAR | Status: AC
  Filled 2020-05-16: qty 2

## 2020-05-16 MED ORDER — FENTANYL CITRATE (PF) 100 MCG/2ML IJ SOLN
INTRAMUSCULAR | Status: DC | PRN
  Administered 2020-05-16: 25 ug via INTRAVENOUS

## 2020-05-16 MED ORDER — PIPERACILLIN-TAZOBACTAM 3.375 G IVPB
3.3750 g | Freq: Three times a day (TID) | INTRAVENOUS | Status: DC
  Administered 2020-05-16 – 2020-05-19 (×9): 3.375 g via INTRAVENOUS
  Filled 2020-05-16 (×9): qty 50

## 2020-05-16 MED ORDER — PIPERACILLIN-TAZOBACTAM 3.375 G IVPB
3.3750 g | Freq: Three times a day (TID) | INTRAVENOUS | Status: DC
  Filled 2020-05-16 (×2): qty 50

## 2020-05-16 MED ORDER — ACETAMINOPHEN 325 MG PO TABS
650.0000 mg | ORAL_TABLET | Freq: Four times a day (QID) | ORAL | Status: DC | PRN
  Administered 2020-05-16 – 2020-05-27 (×4): 650 mg via ORAL
  Filled 2020-05-16 (×4): qty 2

## 2020-05-16 MED ORDER — PROPOFOL 10 MG/ML IV BOLUS
INTRAVENOUS | Status: AC
  Filled 2020-05-16: qty 20

## 2020-05-16 MED ORDER — SODIUM CHLORIDE 0.9 % IV SOLN
80.0000 mg | Freq: Once | INTRAVENOUS | Status: AC
  Administered 2020-05-16: 80 mg via INTRAVENOUS
  Filled 2020-05-16: qty 80

## 2020-05-16 MED ORDER — PHYTONADIONE 5 MG PO TABS: 2.5000 mg | ORAL_TABLET | Freq: Once | ORAL | Status: DC

## 2020-05-16 MED ORDER — FENTANYL CITRATE (PF) 100 MCG/2ML IJ SOLN: 25.0000 ug | INTRAMUSCULAR | Status: DC | PRN

## 2020-05-16 MED ORDER — BUPIVACAINE-EPINEPHRINE 0.25% -1:200000 IJ SOLN
INTRAMUSCULAR | Status: DC | PRN
Start: 2020-05-16 — End: 2020-05-16
  Administered 2020-05-16: 5 mL

## 2020-05-16 MED ORDER — CYANOCOBALAMIN 500 MCG PO TABS
500.0000 ug | ORAL_TABLET | Freq: Every day | ORAL | Status: DC
  Administered 2020-05-16 – 2020-05-28 (×13): 500 ug via ORAL
  Filled 2020-05-16 (×13): qty 1

## 2020-05-16 MED ORDER — BUPIVACAINE-EPINEPHRINE (PF) 0.25% -1:200000 IJ SOLN
INTRAMUSCULAR | Status: AC
  Filled 2020-05-16: qty 30

## 2020-05-16 MED ORDER — VITAMIN K1 10 MG/ML IJ SOLN
10.0000 mg | INTRAVENOUS | Status: AC
  Administered 2020-05-16: 10 mg via INTRAVENOUS
  Filled 2020-05-16: qty 1

## 2020-05-16 MED ORDER — TRAMADOL HCL 50 MG PO TABS
50.0000 mg | ORAL_TABLET | Freq: Once | ORAL | Status: AC
Start: 2020-05-16 — End: 2020-05-16
  Administered 2020-05-16: 50 mg via ORAL
  Filled 2020-05-16: qty 1

## 2020-05-16 MED ORDER — ISOSORBIDE MONONITRATE ER 30 MG PO TB24
15.0000 mg | ORAL_TABLET | Freq: Every day | ORAL | Status: DC
  Administered 2020-05-16 – 2020-05-28 (×13): 15 mg via ORAL
  Filled 2020-05-16 (×13): qty 1

## 2020-05-16 MED ORDER — LACTATED RINGERS IV SOLN: INTRAVENOUS | Status: DC | PRN

## 2020-05-16 MED ORDER — FUROSEMIDE 20 MG PO TABS
20.0000 mg | ORAL_TABLET | Freq: Every day | ORAL | Status: DC
  Administered 2020-05-16 – 2020-05-20 (×5): 20 mg via ORAL
  Filled 2020-05-16 (×6): qty 1

## 2020-05-16 MED ORDER — PRAVASTATIN SODIUM 20 MG PO TABS
20.0000 mg | ORAL_TABLET | Freq: Every day | ORAL | Status: DC
  Administered 2020-05-16 – 2020-05-28 (×13): 20 mg via ORAL
  Filled 2020-05-16 (×13): qty 1

## 2020-05-16 MED ORDER — PROTHROMBIN COMPLEX CONC HUMAN 500 UNITS IV KIT
1550.0000 [IU] | PACK | Status: AC
  Administered 2020-05-16: 1550 [IU] via INTRAVENOUS
  Filled 2020-05-16: qty 1031

## 2020-05-16 MED ORDER — TRAMADOL HCL 50 MG PO TABS: 100.0000 mg | ORAL_TABLET | Freq: Four times a day (QID) | ORAL | Status: DC | PRN

## 2020-05-16 MED ORDER — PROPOFOL 500 MG/50ML IV EMUL
INTRAVENOUS | Status: DC | PRN
  Administered 2020-05-16: 100 ug/kg/min via INTRAVENOUS

## 2020-05-16 MED ORDER — ACETAMINOPHEN 500 MG PO TABS
500.0000 mg | ORAL_TABLET | Freq: Every day | ORAL | Status: DC
  Administered 2020-05-16: 500 mg via ORAL
  Filled 2020-05-16: qty 1

## 2020-05-16 MED ORDER — AMLODIPINE BESYLATE 5 MG PO TABS
2.5000 mg | ORAL_TABLET | Freq: Every day | ORAL | Status: DC
  Administered 2020-05-16 – 2020-05-20 (×5): 2.5 mg via ORAL
  Filled 2020-05-16 (×6): qty 1

## 2020-05-16 MED ORDER — WARFARIN SODIUM 2.5 MG PO TABS
2.5000 mg | ORAL_TABLET | Freq: Once | ORAL | Status: AC
  Administered 2020-05-16: 2.5 mg via ORAL
  Filled 2020-05-16: qty 1

## 2020-05-16 MED ORDER — PROPOFOL 10 MG/ML IV BOLUS
INTRAVENOUS | Status: DC | PRN
  Administered 2020-05-16 (×2): 10 mg via INTRAVENOUS

## 2020-05-16 MED ORDER — WARFARIN - PHARMACIST DOSING INPATIENT: Freq: Every day | Status: DC

## 2020-05-16 SURGICAL SUPPLY — 37 items
ADH SKN CLS APL DERMABOND .7 (GAUZE/BANDAGES/DRESSINGS)
BLADE HEX COATED 2.75 (ELECTRODE) ×2 IMPLANT
BLADE SURG SZ10 CARB STEEL (BLADE) ×2 IMPLANT
COVER SURGICAL LIGHT HANDLE (MISCELLANEOUS) ×2 IMPLANT
COVER WAND RF STERILE (DRAPES) IMPLANT
DECANTER SPIKE VIAL GLASS SM (MISCELLANEOUS) IMPLANT
DERMABOND ADVANCED (GAUZE/BANDAGES/DRESSINGS)
DERMABOND ADVANCED .7 DNX12 (GAUZE/BANDAGES/DRESSINGS) IMPLANT
DRAPE LAPAROSCOPIC ABDOMINAL (DRAPES) IMPLANT
DRAPE LAPAROTOMY T 102X78X121 (DRAPES) IMPLANT
DRAPE LAPAROTOMY TRNSV 102X78 (DRAPES) IMPLANT
DRAPE SHEET LG 3/4 BI-LAMINATE (DRAPES) IMPLANT
ELECT REM PT RETURN 15FT ADLT (MISCELLANEOUS) ×2 IMPLANT
GAUZE PACKING IODOFORM 1X5 (PACKING) ×2 IMPLANT
GAUZE SPONGE 4X4 12PLY STRL (GAUZE/BANDAGES/DRESSINGS) ×2 IMPLANT
GLOVE SURG ENC MOIS LTX SZ7 (GLOVE) ×6 IMPLANT
GLOVE SURG UNDER POLY LF SZ7 (GLOVE) ×2 IMPLANT
GLOVE SURG UNDER POLY LF SZ7.5 (GLOVE) ×6 IMPLANT
GOWN STRL REUS W/ TWL XL LVL3 (GOWN DISPOSABLE) ×1 IMPLANT
GOWN STRL REUS W/TWL LRG LVL3 (GOWN DISPOSABLE) ×4 IMPLANT
GOWN STRL REUS W/TWL XL LVL3 (GOWN DISPOSABLE) ×4 IMPLANT
KIT BASIN OR (CUSTOM PROCEDURE TRAY) ×2 IMPLANT
KIT TURNOVER KIT A (KITS) ×2 IMPLANT
MARKER SKIN DUAL TIP RULER LAB (MISCELLANEOUS) ×2 IMPLANT
NDL HYPO 25X1 1.5 SAFETY (NEEDLE) ×1 IMPLANT
NEEDLE HYPO 25X1 1.5 SAFETY (NEEDLE) ×2 IMPLANT
NS IRRIG 1000ML POUR BTL (IV SOLUTION) ×2 IMPLANT
PACK BASIC VI WITH GOWN DISP (CUSTOM PROCEDURE TRAY) ×2 IMPLANT
PENCIL SMOKE EVACUATOR (MISCELLANEOUS) IMPLANT
SPONGE LAP 18X18 RF (DISPOSABLE) IMPLANT
SPONGE LAP 4X18 RFD (DISPOSABLE) IMPLANT
STAPLER VISISTAT 35W (STAPLE) IMPLANT
SUT MNCRL AB 4-0 PS2 18 (SUTURE) IMPLANT
SUT VIC AB 3-0 SH 18 (SUTURE) IMPLANT
SYR CONTROL 10ML LL (SYRINGE) ×2 IMPLANT
TOWEL OR 17X26 10 PK STRL BLUE (TOWEL DISPOSABLE) ×2 IMPLANT
TOWEL OR NON WOVEN STRL DISP B (DISPOSABLE) ×2 IMPLANT

## 2020-05-16 NOTE — Anesthesia Preprocedure Evaluation (Signed)
Anesthesia Evaluation  Patient identified by MRN, date of birth, ID band Patient awake    Reviewed: Allergy & Precautions, NPO status , Patient's Chart, lab work & pertinent test results  Airway Mallampati: II       Dental  (+) Teeth Intact   Pulmonary former smoker,    breath sounds clear to auscultation       Cardiovascular  Rhythm:Irregular Rate:Normal     Neuro/Psych    GI/Hepatic   Endo/Other    Renal/GU      Musculoskeletal   Abdominal   Peds  Hematology   Anesthesia Other Findings   Reproductive/Obstetrics                             Anesthesia Physical Anesthesia Plan  ASA: III and emergent  Anesthesia Plan: MAC   Post-op Pain Management:    Induction: Intravenous  PONV Risk Score and Plan: Ondansetron  Airway Management Planned: Simple Face Mask and Natural Airway  Additional Equipment:   Intra-op Plan:   Post-operative Plan:   Informed Consent: I have reviewed the patients History and Physical, chart, labs and discussed the procedure including the risks, benefits and alternatives for the proposed anesthesia with the patient or authorized representative who has indicated his/her understanding and acceptance.       Plan Discussed with: CRNA and Anesthesiologist  Anesthesia Plan Comments:         Anesthesia Quick Evaluation

## 2020-05-16 NOTE — ED Notes (Signed)
ED TO INPATIENT HANDOFF REPORT  Name/Age/Gender [name] 87 y.o. male  Code Status    Code Status Orders  (From admission, onward)         Start     Ordered   05/15/20 2342  Full code  Continuous        05/15/20 2341        Code Status History    Date Active Date Inactive Code Status Order ID Comments User Context   09/12/2019 1151 09/12/2019 1946 Full Code [id number]  Sherren Mocha, MD Inpatient   03/18/2018 1306 03/22/2018 1738 Full Code [id number]  Sherren Mocha, MD Inpatient   02/28/2018 1508 02/28/2018 2010 Full Code [id number]  Sherren Mocha, MD Inpatient   03/14/2017 1700 03/15/2017 1322 Full Code [id number]  Evans Lance, MD Inpatient   09/19/2014 0516 09/19/2014 1505 Full Code [id number]  Alfonso Ramus, MD Inpatient   10/09/2012 2042 10/11/2012 2228 Full Code [id number]  Gearlean Alf, MD Inpatient   09/28/2011 1943 10/04/2011 1600 Full Code [id number]  Jillyn Hidden, RN Inpatient   Advance Care Planning Activity      Home/SNF/Other Home  Chief Complaint Perirectal abscess [K61.1]  Level of Care/Admitting Diagnosis ED Disposition    ED Disposition Condition [place]: [facility] [100102]  Level of Care: Telemetry [5]  Admit to tele based on following criteria: Other see comments  Comments: monitor a.fib  Covid Evaluation: Asymptomatic Screening Protocol (No Symptoms)  Diagnosis: Perirectal abscess [470962]  Admitting Physician: Orene Desanctis [[id number]]  Attending Physician: Orene Desanctis [[id number]]       Medical History Past Medical History:  Diagnosis Date  . Aortic valve stenosis 04/05/2015   Formatting of this note might be different from the original. Overview:  ECHO 5/15 Formatting of this note might be different from the original. Overview:  Overview:  ECHO 5/15  . Arthritis    "knees, right shoulder" (03/14/2017)  . Atrial fibrillation, persistent ([place]) 09/19/2014  . CAD (coronary artery disease), native  coronary artery    Cath 2011 LHC (08/2009):~ Proximal LAD 30%, mid to distal LAD 25%, ostial small D1 75% mid AV groove circumflex 99% been subtotal stenosis, proximal to mid RCA 25-30%, mid RCA 30%, mid PDA 30%, EF 50% with inferior hypokinesis.;    July 2011  PCI and DES to circumflex Dr. Olevia Perches   ETT-Myoview (06/2013):  Inferolateral scar, mild peri-infarct ischemia, EF 40%; Intermediate Risk   ECHO EF 55% 03/2013   . CAD (coronary artery disease), native coronary artery 04/05/2015   Cath 2011 LHC (08/2009):~ Proximal LAD 30%, mid to distal LAD 25%, ostial small D1 75% mid AV groove circumflex 99% been subtotal stenosis, proximal to mid RCA 25-30%, mid RCA 30%, mid PDA 30%, EF 50% with inferior hypokinesis.;    July 2011  PCI and DES to circumflex Dr. Olevia Perches   ETT-Myoview (06/2013):  Inferolateral scar, mild peri-infarct ischemia, EF 40%; Intermediate Risk   ECHO EF 55% 03/2013    . Cardiac pacemaker in situ 03/14/2017   Biventricular St. Jude inserted 03/14/17 Dr. Lovena Le for second degree heart block   . Chronic combined systolic and diastolic heart failure ([facility]) 11/19/2017  . CKD (chronic kidney disease), stage III ([place])   . Gastro-esophageal reflux disease without esophagitis 09/24/2009  . GERD   . History of infection of prosthetic knee 04/05/2015   Treated with debridement and irrigation followed by 6 months of triple antibiotics  . History  of kidney stones   . History of peptic ulcer 1970s?  Marland Kitchen History of prostate cancer    Radical prostatectomy in 2006 Dr. Terance Hart   . Hypertensive heart disease   . Internal hemorrhoids 09/11/2018  . Long term (current) use of anticoagulants 10/06/2011  . Mixed hyperlipidemia   . Moderate persistent asthma without complication 8/67/6195  . Noise effect on both inner ears 08/14/2018  . Obesity (BMI 30-39.9)   . OSA on CPAP   . OSA treated with BiPAP 11/15/2015  . Persistent atrial fibrillation ([place]) 09/19/2014   CHA2DS2VASC score 5  Cardioversion 10/08/2014 was on  amiodarone until 2018   . 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)    Initial DVT in 2006 after prostate surgery and had [place] filter placed Bilateral  PE 2013 and placed back on warfarin DVT of right subclavian vein on doppler 10/2012 at time of knee infection   . Personal history of malignant neoplasm of prostate    Radical prostatectomy in 2006 Dr. Terance Hart    . Presbycusis of both ears 08/14/2018  . Severe aortic stenosis   . Severe aortic stenosis 03/18/2018  . Urinary incontinence    MULTIPLE BLADDER SURGERIES - STATES NO URINARY SPHINCTER - PT'S UROLOGIST IS AT [facility]- DR. PETERSON  ( LAST VISIT WAS 09/15/11 )    Allergies Allergies  Allergen Reactions  . Darifenacin Nausea Only and Other (See Comments)    dry mouth and dizziness ENABLEX Dizziness Pt denies  . Codeine Other (See Comments)  . Sulfamethoxazole-Trimethoprim Other (See Comments)  . Lisinopril Cough    Pt denies    IV Location/Drains/Wounds Patient Lines/Drains/Airways Status    Active Line/Drains/Airways    Name Placement date Placement time Site Days   Peripheral IV 05/15/20 Left Antecubital 05/15/20  1958  Antecubital  1   Incision (Closed) 03/19/18 Groin Left 03/19/18  1330  -- 789   Incision (Closed) 03/19/18 Groin Right 03/19/18  1330  -- 789          Labs/Imaging Results for orders placed or performed during the hospital encounter of 05/15/20 (from the past 48 hour(s))  CBC with Differential/Platelet     Status: Abnormal   Collection Time: 05/15/20  8:00 PM  Result Value Ref Range   WBC 11.8 (H) 4.0 - 10.5 K/uL   RBC 4.08 (L) 4.22 - 5.81 MIL/uL   Hemoglobin 10.4 (L) 13.0 - 17.0 g/dL   HCT 32.8 (L) 39.0 - 52.0 %   MCV 80.4 80.0 - 100.0 fL   MCH 25.5 (L) 26.0 - 34.0 pg   MCHC 31.7 30.0 - 36.0 g/dL   RDW 18.4 (H) 11.5 - 15.5 %   Platelets 283 150 - 400 K/uL   nRBC 0.0 0.0 - 0.2 %   Neutrophils Relative % 76 %   Neutro Abs 8.9 (H) 1.7 - 7.7 K/uL   Lymphocytes  Relative 13 %   Lymphs Abs 1.5 0.7 - 4.0 K/uL   Monocytes Relative 8 %   Monocytes Absolute 1.0 0.1 - 1.0 K/uL   Eosinophils Relative 2 %   Eosinophils Absolute 0.2 0.0 - 0.5 K/uL   Basophils Relative 0 %   Basophils Absolute 0.0 0.0 - 0.1 K/uL   Immature Granulocytes 1 %   Abs Immature Granulocytes 0.09 (H) 0.00 - 0.07 K/uL    Comment: Performed at [facility], [place] [address] [place], [place] [address]  Basic metabolic panel     Status: Abnormal  Collection Time: 05/15/20  8:00 PM  Result Value Ref Range   Sodium 138 135 - 145 mmol/L   Potassium 4.2 3.5 - 5.1 mmol/L   Chloride 104 98 - 111 mmol/L   CO2 26 22 - 32 mmol/L   Glucose, Bld 108 (H) 70 - 99 mg/dL    Comment: Glucose reference range applies only to samples taken after fasting for at least 8 hours.   BUN 27 (H) 8 - 23 mg/dL   Creatinine, Ser 1.53 (H) 0.61 - 1.24 mg/dL   Calcium 9.3 8.9 - 10.3 mg/dL   GFR, Estimated 44 (L) >60 mL/min    Comment: (NOTE) Calculated using the CKD-EPI Creatinine Equation (2021)    Anion gap 8 5 - 15    Comment: Performed at [facility], [place] [address], [place], [place] [address]  Protime-INR     Status: Abnormal   Collection Time: 05/15/20  8:00 PM  Result Value Ref Range   Prothrombin Time 32.6 (H) 11.4 - 15.2 seconds   INR 3.3 (H) 0.8 - 1.2    Comment: (NOTE) INR goal varies based on device and disease states. Performed at [facility], [place] [address], [place], [place] [address]   Type and screen     Status: None   Collection Time: 05/15/20  8:00 PM  Result Value Ref Range   ABO/RH(D) A POS    Antibody Screen NEG    Sample Expiration      05/18/2020,2359 Performed at [facility], [place] [address] [place], [place] [address]   Urinalysis, Routine w reflex microscopic     Status: Abnormal   Collection Time: 05/15/20 11:07 PM  Result Value Ref Range   Color, Urine YELLOW YELLOW   APPearance CLEAR  CLEAR   Specific Gravity, Urine 1.033 (H) 1.005 - 1.030   pH 7.0 5.0 - 8.0   Glucose, UA NEGATIVE NEGATIVE mg/dL   Hgb urine dipstick NEGATIVE NEGATIVE   Bilirubin Urine NEGATIVE NEGATIVE   Ketones, ur NEGATIVE NEGATIVE mg/dL   Protein, ur NEGATIVE NEGATIVE mg/dL   Nitrite NEGATIVE NEGATIVE   Leukocytes,Ua NEGATIVE NEGATIVE    Comment: Performed at [place] [address] [place], [place] [address]  POC occult blood, ED     Status: Abnormal   Collection Time: 05/16/20  1:27 AM  Result Value Ref Range   Fecal Occult Bld POSITIVE (A) NEGATIVE   CT Abdomen Pelvis W Contrast  Result Date: 05/15/2020 CLINICAL DATA:  Painful bowel movement with rectal bleeding. EXAM: CT ABDOMEN AND PELVIS WITH CONTRAST TECHNIQUE: Multidetector CT imaging of the abdomen and pelvis was performed using the standard protocol following bolus administration of intravenous contrast. CONTRAST:  74mL OMNIPAQUE IOHEXOL 300 MG/ML  SOLN COMPARISON:  April 22, 2020 FINDINGS: Lower chest: Mild areas of atelectasis are seen within the bilateral lung bases. There is an artificial aortic valve. A small pericardial effusion is noted. Hepatobiliary: No focal liver abnormality is seen. Subcentimeter gallstones are seen within the lumen of an otherwise normal-appearing gallbladder. Pancreas: Unremarkable. No pancreatic ductal dilatation or surrounding inflammatory changes. Spleen: Normal in size without focal abnormality. Adrenals/Urinary Tract: Adrenal glands are unremarkable. A 1.5 cm x 1.3 cm cyst is seen within the lateral aspect of the mid left kidney. Renal cortical thinning is again seen on the right. A 7.1 cm x 4.7 cm lobulated area of low attenuation (approximately 22.12 Hounsfield units) is seen within the anterior aspect of the right kidney. This  is seen as an area of subcapsular fluid on the prior study and is increased in size. Additional areas of lobulated low-attenuation are noted within the mid and  upper right kidney. This is present on the prior study. A 17 mm nonobstructing renal stone is again seen within the lower pole of the right kidney. Additional smaller renal versus vascular calcifications are noted within the mid right kidney. Mild right-sided perinephric inflammatory fat stranding is seen. A mild amount of contrast is seen within the dependent portion of a normal appearing urinary bladder. Stomach/Bowel: Stomach is within normal limits. The appendix is not identified. No evidence of bowel dilatation. Vascular/Lymphatic: Aortic atherosclerosis. And inferior vena cava filter is noted. No enlarged abdominal or pelvic lymph nodes. Reproductive: The prostate gland is surgically absent. Other: A 4.7 cm x 3.1 cm collection of fluid and air, with thin surrounding mildly hyperdense rim, is seen within the posterior perirectal region on the left. This represents a new finding. Musculoskeletal: Multilevel degenerative changes seen throughout the lumbar spine. IMPRESSION: 1. 4.7 cm x 3.1 cm left posterior perirectal abscess. 2. Cholelithiasis. 3. Interval increase in size of the suspected right subcapsular fluid collection since the prior exam with additional findings suggestive of right-sided acute pyelonephritis. MRI correlation is recommended. 4. Nonobstructing renal calculus within the right kidney. 5. Aortic atherosclerosis. Aortic Atherosclerosis (ICD10-I70.0). Electronically Signed   By: Virgina Norfolk M.D.   On: 05/15/2020 22:46    Pending Labs Unresulted Labs (From admission, onward)          Start     Ordered   05/16/20 0500  Protime-INR  Daily,   R      05/15/20 2325   05/16/20 0500  Vitamin B12  (Anemia Panel (PNL))  Tomorrow morning,   R        05/15/20 2328   05/16/20 0500  Folate  (Anemia Panel (PNL))  Tomorrow morning,   R        05/15/20 2328   05/16/20 0500  Iron and TIBC  (Anemia Panel (PNL))  Tomorrow morning,   R        05/15/20 2328   05/16/20 0500  Ferritin  (Anemia  Panel (PNL))  Tomorrow morning,   R        05/15/20 2328   05/16/20 0500  Reticulocytes  (Anemia Panel (PNL))  Tomorrow morning,   R        05/15/20 2328   05/16/20 1950  Basic metabolic panel  Tomorrow morning,   R        05/15/20 2341   05/16/20 0500  CBC  Tomorrow morning,   R        05/15/20 2341   05/16/20 0046  Occult blood card to lab, stool RN will collect  Once,   STAT       Question:  Specimen to be collected by:  Answer:  RN will collect   05/16/20 0045   05/16/20 0023  SARS CORONAVIRUS 2 (TAT 6-24 HRS) Nasopharyngeal Nasopharyngeal Swab  (Tier 3 - Symptomatic/asymptomatic with Precautions)  Once,   STAT       Question Answer Comment  Is this test for diagnosis or screening Screening   Symptomatic for COVID-19 as defined by CDC No   Hospitalized for COVID-19 No   Admitted to ICU for COVID-19 No   Previously tested for COVID-19 Yes   Resident in a congregate (group) care setting Unknown   Employed in healthcare setting Unknown   Has patient completed  COVID vaccination(s) (2 doses of Pfizer/Moderna 1 dose of [employer]) Unknown      05/16/20 0023   05/15/20 2328  Phosphorus  Add-on,   AD       Comments: If Phos >2.5, do nothingIf Phos 1.7 -2.5, give 76mmol NaPhos IV daily x 2 daysIf Phos <1.7, give 15mmol NaPhos IV BID x 2 days    05/15/20 2328   05/15/20 2328  Prealbumin  Add-on,   AD        05/15/20 2328          Vitals/Pain Today's Vitals   05/15/20 1932 05/15/20 2000 05/15/20 2100 05/15/20 2200  BP: 134/74 128/67 (!) 149/63 134/60  Pulse: 61 64 (!) 58 64  Resp: 18 (!) 26 (!) 26 (!) 23  Temp: 98.3 F (36.8 C)     TempSrc: Oral     SpO2: 100% 99% 99% 100%  Weight:      Height:      PainSc:        Isolation Precautions Airborne and Contact precautions  Medications Medications  0.9 %  sodium chloride infusion ( Intravenous Stopped 05/16/20 0136)  polyethylene glycol (MIRALAX / GLYCOLAX) packet 34 g (34 g Oral Given 05/16/20 0002)  feeding  supplement (ENSURE SURGERY) liquid 237 mL (237 mLs Oral Given 05/16/20 0002)  pantoprazole (PROTONIX) 80 mg in sodium chloride 0.9 % 100 mL (0.8 mg/mL) infusion (8 mg/hr Intravenous New Bag/Given 05/16/20 0144)  piperacillin-tazobactam (ZOSYN) IVPB 3.375 g (has no administration in time range)  acetaminophen (TYLENOL) tablet 500 mg (500 mg Oral Given 05/16/20 0115)  amLODipine (NORVASC) tablet 2.5 mg (has no administration in time range)  furosemide (LASIX) tablet 20 mg (has no administration in time range)  isosorbide mononitrate (IMDUR) 24 hr tablet 15 mg (has no administration in time range)  pravastatin (PRAVACHOL) tablet 20 mg (has no administration in time range)  vitamin B-12 (CYANOCOBALAMIN) tablet 500 mcg (has no administration in time range)  lidocaine (XYLOCAINE) 2 % jelly 1 application (1 application Topical Given 05/15/20 2115)  iohexol (OMNIPAQUE) 300 MG/ML solution 80 mL (80 mLs Intravenous Contrast Given 05/15/20 2136)  piperacillin-tazobactam (ZOSYN) IVPB 3.375 g (0 g Intravenous Stopped 05/16/20 0136)  phytonadione (VITAMIN K) tablet 2.5 mg (2.5 mg Oral Given 05/15/20 2359)  pantoprazole (PROTONIX) 80 mg in sodium chloride 0.9 % 100 mL IVPB (80 mg Intravenous New Bag/Given 05/16/20 0114)    Mobility non-ambulatory

## 2020-05-16 NOTE — Progress Notes (Signed)
Pharmacy Antibiotic Note  [name] is a 87 y.o. male admitted on 05/15/2020 with rectal bleeding  Pharmacy has been consulted to dose zosyn for perirectal abscess and unresolved pyelonephritis.  Plan: Zosyn 3.375g IV Q8H infused over 4hrs. Follow renal function and clinical course   Height: 6' (182.9 cm) Weight: 97.1 kg (214 lb) IBW/kg (Calculated) : 77.6  Temp (24hrs), Avg:98.3 F (36.8 C), Min:98.3 F (36.8 C), Max:98.3 F (36.8 C)  Recent Labs  Lab 05/15/20 2000  WBC 11.8*  CREATININE 1.53*    Estimated Creatinine Clearance: 41.1 mL/min (A) (by C-G formula based on SCr of 1.53 mg/dL (H)).    Allergies  Allergen Reactions  . Darifenacin Nausea Only and Other (See Comments)    dry mouth and dizziness ENABLEX Dizziness Pt denies  . Codeine Other (See Comments)  . Sulfamethoxazole-Trimethoprim Other (See Comments)  . Lisinopril Cough    Pt denies    Antimicrobials this admission: 4/10 zosyn >>  Dose adjustments this admission:   Microbiology results:   Thank you for allowing pharmacy to be a part of this patient's care.  Dolly Rias RPh 05/16/2020, 12:58 AM

## 2020-05-16 NOTE — Op Note (Signed)
Preoperative diagnosis: perirectal abscess Postoperative diagnosis: saa Procedure: Incision and drainage of perirectal abscess Surgeon: Dr. 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  His INR was 3.4.  He is also having evidence of a GI bleed.  I discussed with him this morning going to the operating room to drain the perirectal abscess due to the infection after receiving Kcentra.  Procedure: After informed consent was obtained and he received Kcentra was taken to the operative room.  He was already on antibiotics regimen.  He had SCDs in place.  He was rolled into the left lateral position and appropriately padded.  He was comfortable.  He then underwent sedation.  I then prepped and draped him with Betadine.  Surgical timeout was then performed.  I filtrated quarter percent Marcaine with epinephrine.  I then made an incision overlying the abscess.  There was a fair amount of purulence present.  Some of the skin above the abscess came apart as well.  I completely evacuated the cavity after taking cultures.  This was irrigated.  I packed this with iodoform.  Dressings were placed.  He tolerated this well and was transferred to PACU.

## 2020-05-16 NOTE — Consult Note (Signed)
Reason for Consult:perirectal abscess Referring Physician: Dr Danielle Rankin is an 87 y.o. male.  HPI: 79yom with mmp including afib, VTE history s/p ivc filter on warfarin, TAVR, CAD, chf, pacemaker, CKD who has noted darker colored stools and then about 3 days ago noted perirectal pain while straining to have a bm.  He did note some brb when wiping also. Due to persistence of perirectal pain he presented to the er.  He was noted to have a decreased hct, INR of 3.3, wbc of 11.8.  He underwent a ct scan which confirms what is present on his exam with left sided posterior perirectal abscess.  This am when I see him he is complaining of pain    Past Medical History:  Diagnosis Date  . Aortic valve stenosis 04/05/2015   Formatting of this note might be different from the original. Overview:  ECHO 5/15 Formatting of this note might be different from the original. Overview:  Overview:  ECHO 5/15  . Arthritis    "knees, right shoulder" (03/14/2017)  . Atrial fibrillation, persistent ([place]) 09/19/2014  . CAD (coronary artery disease), native coronary artery    Cath 2011 LHC (08/2009):~ Proximal LAD 30%, mid to distal LAD 25%, ostial small D1 75% mid AV groove circumflex 99% been subtotal stenosis, proximal to mid RCA 25-30%, mid RCA 30%, mid PDA 30%, EF 50% with inferior hypokinesis.;    July 2011  PCI and DES to circumflex Dr. Olevia Perches   ETT-Myoview (06/2013):  Inferolateral scar, mild peri-infarct ischemia, EF 40%; Intermediate Risk   ECHO EF 55% 03/2013   . CAD (coronary artery disease), native coronary artery 04/05/2015   Cath 2011 LHC (08/2009):~ Proximal LAD 30%, mid to distal LAD 25%, ostial small D1 75% mid AV groove circumflex 99% been subtotal stenosis, proximal to mid RCA 25-30%, mid RCA 30%, mid PDA 30%, EF 50% with inferior hypokinesis.;    July 2011  PCI and DES to circumflex Dr. Olevia Perches   ETT-Myoview (06/2013):  Inferolateral scar, mild peri-infarct ischemia, EF 40%; Intermediate Risk   ECHO  EF 55% 03/2013    . Cardiac pacemaker in situ 03/14/2017   Biventricular St. Jude inserted 03/14/17 Dr. Lovena Le for second degree heart block   . Chronic combined systolic and diastolic heart failure ([place]) 11/19/2017  . CKD (chronic kidney disease), stage III ([place])   . Gastro-esophageal reflux disease without esophagitis 09/24/2009  . GERD   . History of infection of prosthetic knee 04/05/2015   Treated with debridement and irrigation followed by 6 months of triple antibiotics  . History of kidney stones   . History of peptic ulcer 1970s?  Marland Kitchen History of prostate cancer    Radical prostatectomy in 2006 Dr. Terance Hart   . Hypertensive heart disease   . Internal hemorrhoids 09/11/2018  . Long term (current) use of anticoagulants 10/06/2011  . Mixed hyperlipidemia   . Moderate persistent asthma without complication 07/13/3014  . Noise effect on both inner ears 08/14/2018  . Obesity (BMI 30-39.9)   . OSA on CPAP   . OSA treated with BiPAP 11/15/2015  . Persistent atrial fibrillation ([place]) 09/19/2014   CHA2DS2VASC score 5  Cardioversion 10/08/2014 was on amiodarone until 2018   . 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)    Initial DVT in 2006 after prostate surgery and had Greenfield filter placed Bilateral  PE 2013 and placed back on warfarin DVT of right subclavian vein on doppler 10/2012 at time of  knee infection   . Personal history of malignant neoplasm of prostate    Radical prostatectomy in 2006 Dr. Terance Hart    . Presbycusis of both ears 08/14/2018  . Severe aortic stenosis   . Severe aortic stenosis 03/18/2018  . Urinary incontinence    MULTIPLE BLADDER SURGERIES - STATES NO URINARY SPHINCTER - PT'S UROLOGIST IS AT [facility]- DR. PETERSON  ( LAST VISIT WAS 09/15/11 )    Past Surgical History:  Procedure Laterality Date  . APPENDECTOMY    . BI-VENTRICULAR PACEMAKER INSERTION (CRT-P)  03/14/2017  . BIV PACEMAKER INSERTION CRT-P N/A 03/14/2017   Procedure: BIV PACEMAKER INSERTION  CRT-P;  Surgeon: Evans Lance, MD;  Location: [facility];  Service: Cardiovascular;  Laterality: N/A;  . CARDIOVERSION N/A 10/08/2014   Procedure: CARDIOVERSION;  Surgeon: Jacolyn Reedy, MD;  Location: [facility];  Service: Cardiovascular;  Laterality: N/A;  . CATARACT EXTRACTION W/ INTRAOCULAR LENS  IMPLANT, BILATERAL Bilateral   . CORONARY ANGIOPLASTY WITH STENT PLACEMENT  08/25/2009   DES-mid LCx 08/2009; 30% pLAD, 25% m/dLAD, 75% ostial D1, 99% mLCx s/p DES, 25-30% p/mRCA, 40% mRCA, 30% mPDA stenoses; LVEF 50%, inf hypokinesis  . CORONARY STENT INTERVENTION N/A 03/18/2018   Procedure: CORONARY STENT INTERVENTION;  Surgeon: Sherren Mocha, MD;  Location: [facility];  Service: Cardiovascular;  Laterality: N/A;  . EXCISIONAL HEMORRHOIDECTOMY    . I & D KNEE WITH POLY EXCHANGE Left 10/09/2012   Procedure: IRRIGATION AND DEBRIDEMENT LEFT KNEE WITH POLY REVISION;  Surgeon: Gearlean Alf, MD;  Location: [facility];  Service: Orthopedics;  Laterality: Left;  . INGUINAL HERNIA REPAIR Right   . INTRAOPERATIVE TRANSTHORACIC ECHOCARDIOGRAM  03/19/2018   Procedure: Intraoperative Transthoracic Echocardiogram;  Surgeon: Sherren Mocha, MD;  Location: [facility];  Service: Open Heart Surgery;;  . JOINT REPLACEMENT    . KNEE ARTHROTOMY Left 10/09/2012   Procedure: LEFT KNEE ARTHROTOMY;  Surgeon: Gearlean Alf, MD;  Location: [facility];  Service: Orthopedics;  Laterality: Left;  . LEFT HEART CATH AND CORONARY ANGIOGRAPHY N/A 02/28/2018   Procedure: LEFT HEART CATH AND CORONARY ANGIOGRAPHY;  Surgeon: Sherren Mocha, MD;  Location: [facility];  Service: Cardiovascular;  Laterality: N/A;  . LEFT HEART CATH AND CORONARY ANGIOGRAPHY N/A 09/12/2019   Procedure: LEFT HEART CATH AND CORONARY ANGIOGRAPHY;  Surgeon: Sherren Mocha, MD;  Location: [facility];  Service: Cardiovascular;  Laterality: N/A;  . PROSTATECTOMY  04/25/2004  . REPLACEMENT TOTAL KNEE Bilateral   . SKIN BIOPSY     "off  nose; wasn't cancer; it was tested" (03/14/2017)  . TRANSCATHETER AORTIC VALVE REPLACEMENT, TRANSFEMORAL N/A 03/19/2018   Procedure: TRANSCATHETER AORTIC VALVE REPLACEMENT, TRANSFEMORAL;  Surgeon: Sherren Mocha, MD;  Location: [facility];  Service: Open Heart Surgery;  Laterality: N/A;  . Uretheral implants     multiple for incontinence  . VENA CAVA FILTER PLACEMENT      Family History  Problem Relation Age of Onset  . Melanoma Father   . Melanoma Brother     Social History:  reports that he quit smoking about 59 years ago. His smoking use included cigarettes. He has a 10.00 pack-year smoking history. He has never used smokeless tobacco. He reports that he does not drink alcohol and does not use drugs.  Allergies:  Allergies  Allergen Reactions  . Darifenacin Nausea Only and Other (See Comments)    dry mouth and dizziness ENABLEX Dizziness Pt denies  . Codeine Other (See Comments)  .  Sulfamethoxazole-Trimethoprim Other (See Comments)  . Lisinopril Cough    Pt denies    Medications: I have reviewed the patient's current medications.  Results for orders placed or performed during the hospital encounter of 05/15/20 (from the past 48 hour(s))  CBC with Differential/Platelet     Status: Abnormal   Collection Time: 05/15/20  8:00 PM  Result Value Ref Range   WBC 11.8 (H) 4.0 - 10.5 K/uL   RBC 4.08 (L) 4.22 - 5.81 MIL/uL   Hemoglobin 10.4 (L) 13.0 - 17.0 g/dL   HCT 32.8 (L) 39.0 - 52.0 %   MCV 80.4 80.0 - 100.0 fL   MCH 25.5 (L) 26.0 - 34.0 pg   MCHC 31.7 30.0 - 36.0 g/dL   RDW 18.4 (H) 11.5 - 15.5 %   Platelets 283 150 - 400 K/uL   nRBC 0.0 0.0 - 0.2 %   Neutrophils Relative % 76 %   Neutro Abs 8.9 (H) 1.7 - 7.7 K/uL   Lymphocytes Relative 13 %   Lymphs Abs 1.5 0.7 - 4.0 K/uL   Monocytes Relative 8 %   Monocytes Absolute 1.0 0.1 - 1.0 K/uL   Eosinophils Relative 2 %   Eosinophils Absolute 0.2 0.0 - 0.5 K/uL   Basophils Relative 0 %   Basophils Absolute 0.0 0.0 - 0.1 K/uL    Immature Granulocytes 1 %   Abs Immature Granulocytes 0.09 (H) 0.00 - 0.07 K/uL    Comment: Performed at [facility], [place] [address], [place], [place] [address]  Basic metabolic panel     Status: Abnormal   Collection Time: 05/15/20  8:00 PM  Result Value Ref Range   Sodium 138 135 - 145 mmol/L   Potassium 4.2 3.5 - 5.1 mmol/L   Chloride 104 98 - 111 mmol/L   CO2 26 22 - 32 mmol/L   Glucose, Bld 108 (H) 70 - 99 mg/dL    Comment: Glucose reference range applies only to samples taken after fasting for at least 8 hours.   BUN 27 (H) 8 - 23 mg/dL   Creatinine, Ser 1.53 (H) 0.61 - 1.24 mg/dL   Calcium 9.3 8.9 - 10.3 mg/dL   GFR, Estimated 44 (L) >60 mL/min    Comment: (NOTE) Calculated using the CKD-EPI Creatinine Equation (2021)    Anion gap 8 5 - 15    Comment: Performed at [facility], [place] [address] [place], [place] [address]  Protime-INR     Status: Abnormal   Collection Time: 05/15/20  8:00 PM  Result Value Ref Range   Prothrombin Time 32.6 (H) 11.4 - 15.2 seconds   INR 3.3 (H) 0.8 - 1.2    Comment: (NOTE) INR goal varies based on device and disease states. Performed at [facility], [place] [address] [place], [place] [address]   Type and screen     Status: None   Collection Time: 05/15/20  8:00 PM  Result Value Ref Range   ABO/RH(D) A POS    Antibody Screen NEG    Sample Expiration      05/18/2020,2359 Performed at [facility], [place] [address], [place], [place] [address]   Urinalysis, Routine w reflex microscopic     Status: Abnormal   Collection Time: 05/15/20 11:07 PM  Result Value Ref Range   Color, Urine YELLOW YELLOW   APPearance CLEAR CLEAR   Specific Gravity, Urine 1.033 (H) 1.005 - 1.030   pH 7.0 5.0 - 8.0  Glucose, UA NEGATIVE NEGATIVE mg/dL   Hgb urine dipstick NEGATIVE NEGATIVE   Bilirubin Urine NEGATIVE NEGATIVE   Ketones, ur NEGATIVE NEGATIVE mg/dL   Protein, ur NEGATIVE  NEGATIVE mg/dL   Nitrite NEGATIVE NEGATIVE   Leukocytes,Ua NEGATIVE NEGATIVE    Comment: Performed at [place] [address] [place], [place] [address]  POC occult blood, ED     Status: Abnormal   Collection Time: 05/16/20  1:27 AM  Result Value Ref Range   Fecal Occult Bld POSITIVE (A) NEGATIVE  Protime-INR     Status: Abnormal   Collection Time: 05/16/20  6:11 AM  Result Value Ref Range   Prothrombin Time 33.4 (H) 11.4 - 15.2 seconds   INR 3.4 (H) 0.8 - 1.2    Comment: (NOTE) INR goal varies based on device and disease states. Performed at [facility], [place] [address] [place], [place] [address]   Prealbumin     Status: Abnormal   Collection Time: 05/16/20  6:11 AM  Result Value Ref Range   Prealbumin 7.9 (L) 18 - 38 mg/dL    Comment: Performed at [facility], [place] [address] [place], [place] [address]  Basic metabolic panel     Status: Abnormal   Collection Time: 05/16/20  6:11 AM  Result Value Ref Range   Sodium 136 135 - 145 mmol/L   Potassium 4.7 3.5 - 5.1 mmol/L   Chloride 103 98 - 111 mmol/L   CO2 26 22 - 32 mmol/L   Glucose, Bld 99 70 - 99 mg/dL    Comment: Glucose reference range applies only to samples taken after fasting for at least 8 hours.   BUN 24 (H) 8 - 23 mg/dL   Creatinine, Ser 1.29 (H) 0.61 - 1.24 mg/dL   Calcium 8.9 8.9 - 10.3 mg/dL   GFR, Estimated 54 (L) >60 mL/min    Comment: (NOTE) Calculated using the CKD-EPI Creatinine Equation (2021)    Anion gap 7 5 - 15    Comment: Performed at [facility], [place] [address] [place], [place] [address]  CBC     Status: Abnormal   Collection Time: 05/16/20  6:11 AM  Result Value Ref Range   WBC 12.6 (H) 4.0 - 10.5 K/uL   RBC 4.01 (L) 4.22 - 5.81 MIL/uL   Hemoglobin 10.4 (L) 13.0 - 17.0 g/dL   HCT 32.7 (L) 39.0 - 52.0 %   MCV 81.5 80.0 - 100.0 fL   MCH 25.9 (L) 26.0 - 34.0 pg   MCHC 31.8 30.0 - 36.0 g/dL   RDW 18.3 (H) 11.5  - 15.5 %   Platelets 285 150 - 400 K/uL   nRBC 0.0 0.0 - 0.2 %    Comment: Performed at [facility], [place] [address] [place], [place] [address]  Phosphorus     Status: None   Collection Time: 05/16/20  6:11 AM  Result Value Ref Range   Phosphorus 3.4 2.5 - 4.6 mg/dL    Comment: Performed at [facility], [place] [address], [place], [place] [address]  Reticulocytes     Status: Abnormal   Collection Time: 05/16/20  6:11 AM  Result Value Ref Range   Retic Ct Pct 1.9 0.4 - 3.1 %   RBC. 4.01 (L) 4.22 - 5.81 MIL/uL   Retic Count, Absolute 75.8 19.0 - 186.0 K/uL   Immature Retic Fract 31.1 (H) 2.3 - 15.9 %    Comment: Performed at  [facility], [place] [address], [place], [place] [address]    CT Abdomen Pelvis W Contrast  Result Date: 05/15/2020 CLINICAL DATA:  Painful bowel movement with rectal bleeding. EXAM: CT ABDOMEN AND PELVIS WITH CONTRAST TECHNIQUE: Multidetector CT imaging of the abdomen and pelvis was performed using the standard protocol following bolus administration of intravenous contrast. CONTRAST:  55mL OMNIPAQUE IOHEXOL 300 MG/ML  SOLN COMPARISON:  April 22, 2020 FINDINGS: Lower chest: Mild areas of atelectasis are seen within the bilateral lung bases. There is an artificial aortic valve. A small pericardial effusion is noted. Hepatobiliary: No focal liver abnormality is seen. Subcentimeter gallstones are seen within the lumen of an otherwise normal-appearing gallbladder. Pancreas: Unremarkable. No pancreatic ductal dilatation or surrounding inflammatory changes. Spleen: Normal in size without focal abnormality. Adrenals/Urinary Tract: Adrenal glands are unremarkable. A 1.5 cm x 1.3 cm cyst is seen within the lateral aspect of the mid left kidney. Renal cortical thinning is again seen on the right. A 7.1 cm x 4.7 cm lobulated area of low attenuation (approximately 22.12 Hounsfield units) is seen within the anterior aspect of the right  kidney. This is seen as an area of subcapsular fluid on the prior study and is increased in size. Additional areas of lobulated low-attenuation are noted within the mid and upper right kidney. This is present on the prior study. A 17 mm nonobstructing renal stone is again seen within the lower pole of the right kidney. Additional smaller renal versus vascular calcifications are noted within the mid right kidney. Mild right-sided perinephric inflammatory fat stranding is seen. A mild amount of contrast is seen within the dependent portion of a normal appearing urinary bladder. Stomach/Bowel: Stomach is within normal limits. The appendix is not identified. No evidence of bowel dilatation. Vascular/Lymphatic: Aortic atherosclerosis. And inferior vena cava filter is noted. No enlarged abdominal or pelvic lymph nodes. Reproductive: The prostate gland is surgically absent. Other: A 4.7 cm x 3.1 cm collection of fluid and air, with thin surrounding mildly hyperdense rim, is seen within the posterior perirectal region on the left. This represents a new finding. Musculoskeletal: Multilevel degenerative changes seen throughout the lumbar spine. IMPRESSION: 1. 4.7 cm x 3.1 cm left posterior perirectal abscess. 2. Cholelithiasis. 3. Interval increase in size of the suspected right subcapsular fluid collection since the prior exam with additional findings suggestive of right-sided acute pyelonephritis. MRI correlation is recommended. 4. Nonobstructing renal calculus within the right kidney. 5. Aortic atherosclerosis. Aortic Atherosclerosis (ICD10-I70.0). Electronically Signed   By: Virgina Norfolk M.D.   On: 05/15/2020 22:46    Review of Systems  Gastrointestinal: Positive for anal bleeding and rectal pain.  All other systems reviewed and are negative.  Blood pressure 137/62, pulse 69, temperature 98.3 F (36.8 C), resp. rate 18, height 6' (1.829 m), weight 97.1 kg, SpO2 98 %. Physical Exam Constitutional:       Appearance: Normal appearance.  HENT:     Head: Normocephalic and atraumatic.     Mouth/Throat:     Mouth: Mucous membranes are moist.  Eyes:     General: No scleral icterus. Cardiovascular:     Rate and Rhythm: Normal rate. Rhythm irregular.  Pulmonary:     Effort: Pulmonary effort is normal.     Breath sounds: Normal breath sounds.  Abdominal:     General: There is no distension.     Palpations: Abdomen is soft.     Tenderness: There is no abdominal tenderness.  Skin:    Capillary  Refill: Capillary refill takes less than 2 seconds.  Neurological:     General: No focal deficit present.     Mental Status: He is alert.  Psychiatric:        Mood and Affect: Mood normal.        Behavior: Behavior normal.     Assessment/Plan: Perirectal abscess - I think needs to be drained today, will await covid test sent last night -discussed with TRH I think just giving Kcentra now and doing this is best plan due to infection -discussed with patient and wife proceeding with I/D of perirectal abscess with risks, recovery -some risk involved with bleeding, cardiac history but this is not elective  Rolm Bookbinder 05/16/2020, 7:38 AM

## 2020-05-16 NOTE — Transfer of Care (Signed)
Immediate Anesthesia Transfer of Care Note  Patient: [name]  Procedure(s) Performed: INCISION AND DRAINAGE ABSCESS (N/A )  Patient Location: PACU  Anesthesia Type:MAC  Level of Consciousness: sedated  Airway & Oxygen Therapy: Patient Spontanous Breathing and Patient connected to face mask oxygen  Post-op Assessment: Report given to RN and Post -op Vital signs reviewed and stable  Post vital signs: Reviewed and stable  Last Vitals:  Vitals Value Taken Time  BP 103/55 05/16/20 1133  Temp    Pulse 63 05/16/20 1134  Resp 22 05/16/20 1134  SpO2 96 % 05/16/20 1134  Vitals shown include unvalidated device data.  Last Pain:  Vitals:   05/16/20 0928  TempSrc: Oral  PainSc:       Patients Stated Pain Goal: 2 (26/71/24 5809)  Complications: No complications documented.

## 2020-05-16 NOTE — Progress Notes (Signed)
ANTICOAGULATION CONSULT NOTE - Initial Consult  Pharmacy Consult for Warfarin Indication: history of DVT/PE, atrial fibrillation   Allergies  Allergen Reactions  . Darifenacin Nausea Only and Other (See Comments)    dry mouth and dizziness ENABLEX Dizziness Pt denies  . Codeine Other (See Comments)  . Sulfamethoxazole-Trimethoprim Other (See Comments)  . Lisinopril Cough    Pt denies    Patient Measurements: Height: 6' (182.9 cm) Weight: 97.1 kg (214 lb) IBW/kg (Calculated) : 77.6   Vital Signs: Temp: 98.2 F (36.8 C) (04/10 1309) Temp Source: Oral (04/10 1309) BP: 125/72 (04/10 1309) Pulse Rate: 66 (04/10 1309)  Labs: Recent Labs    05/15/20 2000 05/16/20 0611 05/16/20 1201  HGB 10.4* 10.4*  --   HCT 32.8* 32.7*  --   PLT 283 285  --   LABPROT 32.6* 33.4* 16.9*  INR 3.3* 3.4* 1.4*  CREATININE 1.53* 1.29*  --     Estimated Creatinine Clearance: 48.7 mL/min (A) (by C-G formula based on SCr of 1.29 mg/dL (H)).   Medical History: Past Medical History:  Diagnosis Date  . Aortic valve stenosis 04/05/2015   Formatting of this note might be different from the original. Overview:  ECHO 5/15 Formatting of this note might be different from the original. Overview:  Overview:  ECHO 5/15  . Arthritis    "knees, right shoulder" (03/14/2017)  . Atrial fibrillation, persistent ([place]) 09/19/2014  . CAD (coronary artery disease), native coronary artery    Cath 2011 LHC (08/2009):~ Proximal LAD 30%, mid to distal LAD 25%, ostial small D1 75% mid AV groove circumflex 99% been subtotal stenosis, proximal to mid RCA 25-30%, mid RCA 30%, mid PDA 30%, EF 50% with inferior hypokinesis.;    July 2011  PCI and DES to circumflex Dr. Olevia Perches   ETT-Myoview (06/2013):  Inferolateral scar, mild peri-infarct ischemia, EF 40%; Intermediate Risk   ECHO EF 55% 03/2013   . CAD (coronary artery disease), native coronary artery 04/05/2015   Cath 2011 LHC (08/2009):~ Proximal LAD 30%, mid to distal LAD 25%,  ostial small D1 75% mid AV groove circumflex 99% been subtotal stenosis, proximal to mid RCA 25-30%, mid RCA 30%, mid PDA 30%, EF 50% with inferior hypokinesis.;    July 2011  PCI and DES to circumflex Dr. Olevia Perches   ETT-Myoview (06/2013):  Inferolateral scar, mild peri-infarct ischemia, EF 40%; Intermediate Risk   ECHO EF 55% 03/2013    . Cardiac pacemaker in situ 03/14/2017   Biventricular St. Jude inserted 03/14/17 Dr. Lovena Le for second degree heart block   . Chronic combined systolic and diastolic heart failure ([place]) 11/19/2017  . CKD (chronic kidney disease), stage III ([place])   . Gastro-esophageal reflux disease without esophagitis 09/24/2009  . GERD   . History of infection of prosthetic knee 04/05/2015   Treated with debridement and irrigation followed by 6 months of triple antibiotics  . History of kidney stones   . History of peptic ulcer 1970s?  Marland Kitchen History of prostate cancer    Radical prostatectomy in 2006 Dr. Terance Hart   . Hypertensive heart disease   . Internal hemorrhoids 09/11/2018  . Long term (current) use of anticoagulants 10/06/2011  . Mixed hyperlipidemia   . Moderate persistent asthma without complication 0/86/5784  . Noise effect on both inner ears 08/14/2018  . Obesity (BMI 30-39.9)   . OSA on CPAP   . OSA treated with BiPAP 11/15/2015  . Persistent atrial fibrillation ([place]) 09/19/2014   CHA2DS2VASC score 5  Cardioversion 10/08/2014  was on amiodarone until 2018   . 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)    Initial DVT in 2006 after prostate surgery and had Greenfield filter placed Bilateral  PE 2013 and placed back on warfarin DVT of right subclavian vein on doppler 10/2012 at time of knee infection   . Personal history of malignant neoplasm of prostate    Radical prostatectomy in 2006 Dr. Terance Hart    . Presbycusis of both ears 08/14/2018  . Severe aortic stenosis   . Severe aortic stenosis 03/18/2018  . Urinary incontinence    MULTIPLE BLADDER SURGERIES  - STATES NO URINARY SPHINCTER - PT'S UROLOGIST IS AT [facility]- DR. PETERSON  ( LAST VISIT WAS 09/15/11 )    Medications:  PTA warfarin regimen: 2.5mg  PO daily-last dose reported 05/14/2020 at 2100  Assessment: 82 y/oM with PMH of prostate cx s/pprostatectomy, aortic stenosis s/p TAVR, atrial fibrillation, history of recurrent DVT, PE s/p IVC filter on warfarin who presented with rectal bleeding. On admission, Hgb 10.4 (down from 16.2 on 04/27/20), INR supratherapeutic at 3.3. Patient was given Vitamin K 2.5mg  PO x 1 4/9 PM. This morning, INR 3.4. Decision was made for urgent I&D of perirectal abscess, so MD gave Eppie Gibson and Vitamin K 10mg  IV x 1 prior to procedure. INR checked post I&D 1.4. Pharmacy consulted to resume warfarin therapy today. Hgb 10.4, Pltc WNL.   Goal of Therapy:  INR 2-3 Monitor platelets by anticoagulation protocol: Yes   Plan:  Warfarin 2.5mg  PO x 1 this PM (discussed dosing with Dr.Chiu).  Daily PT/INR, CBC Monitor very closely for s/sx of bleeding  Lindell Spar M 05/16/2020,2:11 PM

## 2020-05-16 NOTE — Anesthesia Postprocedure Evaluation (Signed)
Anesthesia Post Note  Patient: [name]  Procedure(s) Performed: INCISION AND DRAINAGE  PERIRECTAL ABSCESS (N/A )     Patient location during evaluation: PACU Anesthesia Type: MAC Level of consciousness: awake and alert Pain management: pain level controlled Vital Signs Assessment: post-procedure vital signs reviewed and stable Respiratory status: spontaneous breathing, nonlabored ventilation, respiratory function stable and patient connected to nasal cannula oxygen Cardiovascular status: stable and blood pressure returned to baseline Postop Assessment: no apparent nausea or vomiting Anesthetic complications: no   No complications documented.  Last Vitals:  Vitals:   05/16/20 1215 05/16/20 1230  BP: 117/66 130/69  Pulse: 67 60  Resp: 17 13  Temp:    SpO2: 95% 96%    Last Pain:  Vitals:   05/16/20 1230  TempSrc:   PainSc: 0-No pain   Pain Goal: Patients Stated Pain Goal: 2 (05/16/20 0400)                 Shere Eisenhart COKER

## 2020-05-16 NOTE — Progress Notes (Signed)
PROGRESS NOTE    [name]  YYT:[id number] DOB: [birth year] DOA: 05/15/2020 PCP: System, Provider Not In    Brief Narrative:  87 y.o. male with medical history significant hx of prostate cx s/p prostatectomy 2016,  permanent atrial fibrillation, history of recurrent DVT, PE s/p IVC filter on Coumadin, aortic stenosis s/p TAVR, CAD, hypertensive heart failure s/p pacemaker, CKD stage IIIb who presents with concerns of rectal pain, found to have perirectal abscess. Also found to have 6g drop in hgb with heme pos stools on presentation  Assessment & Plan:   Principal Problem:   Perirectal abscess Active Problems:   Hypertensive heart disease   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)   Gastro-esophageal reflux disease without esophagitis   Long term (current) use of anticoagulants   Persistent atrial fibrillation (HCC)   CKD (chronic kidney disease), stage III (HCC)   S/P TAVR (transcatheter aortic valve replacement)   OSA on CPAP   CAD (coronary artery disease), native coronary artery   Atrial fibrillation, persistent (HCC)   Pyelonephritis of right kidney   Presence of IVC filter   Constipation, chronic   Cysts of right kidney   Perirectal abscess -Personally reviewed on CT abd -Discussed case with General Surgery. Agree with INR reversal this AM for surgery -Pt is now s/p I/D, appreciate assistance by General Surgery -Will f/u on culture results -Continued on empiric zosyn -Per Surgery, anticoagulation can be resumed as early as tonight  Anemia/Melena -Suspect acute GI bleed in the setting of coumadin use -Chart reviewed. Pt had been followed closely by [facility] for hx of hemorrhoids in the past -Hgb is down to 10 from 16 one month prior to visit. Stools are heme pos -Discussed with on call GI. Given recent surgical drainage of perirectal abscess, recommendation is to hold off on inpatient endoscopy for now and f/u for outpatient  endo when more stable -Will cont to follow hgb trends and notify GI if there are concerns of continued or on-going bleed  Persistent pyelonephritis -Pt noted to have UTI about a month ago placed on Bactrim and later reevaluated in ED and placed on Cefpodoxime. No growth in UA at that time.  -CT abdomen now shows worsening, pt is continued on IV Zosyn -f/u repeat cultures If not improved, consider MRI of the abdomen   Hx of persistent atrial fibrillation CHADVASc score of 5 warfarin currently on hold per above  Hx of recurrent DVT/PE with supratherapeutic INR Patient with history of provoked DVT following prostatectomy with IVC placement afterwards.   He then later developed bacteremia and developed pulmonary embolism and DVT and was placed on warfarin. Admission INR of 3.3.  Goal of 2-3. Patient's PE and DVT are noted to be remote. Per General Surgery, OK to resume anticoagulation as early as tonight from surgical perspective  History of heart block s/p pacemaker Stable  Hx of aortic stenosis s/p TAVR Appears to have a bioprosthetic valve  CAD s/p stent  Remains stable and asymptomatic  CKD stage III Creatinine stable  OSA Continue CPAP  DVT prophylaxis: SCD's Code Status: Full Family Communication: Pt in room, family currently not at bedside  Status is: Observation  The patient will require care spanning > 2 midnights and should be moved to inpatient because: IV treatments appropriate due to intensity of illness or inability to take PO and Inpatient level of care appropriate due to severity of illness  Dispo: The patient is from: Home  Anticipated d/c is to: Home              Patient currently is not medically stable to d/c.   Difficult to place patient No   Consultants:   General Surgery  Discussed case with GI  Procedures:   I/D of perirectal abscess 4/10  Antimicrobials: Anti-infectives (From admission, onward)   Start     Dose/Rate  Route Frequency Ordered Stop   05/16/20 0800  [MAR Hold]  piperacillin-tazobactam (ZOSYN) IVPB 3.375 g        (MAR Hold since Sun 05/16/2020 at 1118.Hold Reason: Transfer to a Procedural area.)   3.375 g 12.5 mL/hr over 240 Minutes Intravenous Every 8 hours 05/16/20 0059     05/15/20 2315  piperacillin-tazobactam (ZOSYN) IVPB 3.375 g        3.375 g 100 mL/hr over 30 Minutes Intravenous  Once 05/15/20 2311 05/16/20 0136       Subjective: Seen in PACU following I/D of abscess. Pt is with out complaints at this time  Objective: Vitals:   05/16/20 1145 05/16/20 1206 05/16/20 1215 05/16/20 1230  BP: 112/61 135/66 117/66 130/69  Pulse: 62 66 67 60  Resp: (!) 25 15 17 13   Temp:  98.2 F (36.8 C)    TempSrc:      SpO2: 100% 97% 95% 96%  Weight:      Height:        Intake/Output Summary (Last 24 hours) at 05/16/2020 1245 Last data filed at 05/16/2020 1218 Gross per 24 hour  Intake 830.98 ml  Output --  Net 830.98 ml   Filed Weights   05/15/20 1923  Weight: 97.1 kg    Examination: General exam: Awake, laying in bed, in nad Respiratory system: Normal respiratory effort, no wheezing Cardiovascular system: regular rate, s1, s2 Gastrointestinal system: Soft, nondistended, positive BS Central nervous system: CN2-12 grossly intact, strength intact Extremities: Perfused, no clubbing Skin: Normal skin turgor, no notable skin lesions seen Psychiatry: Mood normal // no visual hallucinations   Data Reviewed: I have personally reviewed following labs and imaging studies  CBC: Recent Labs  Lab 05/15/20 2000 05/16/20 0611  WBC 11.8* 12.6*  NEUTROABS 8.9*  --   HGB 10.4* 10.4*  HCT 32.8* 32.7*  MCV 80.4 81.5  PLT 283 099   Basic Metabolic Panel: Recent Labs  Lab 05/15/20 2000 05/16/20 0611  NA 138 136  K 4.2 4.7  CL 104 103  CO2 26 26  GLUCOSE 108* 99  BUN 27* 24*  CREATININE 1.53* 1.29*  CALCIUM 9.3 8.9  PHOS  --  3.4   GFR: Estimated Creatinine Clearance: 48.7  mL/min (A) (by C-G formula based on SCr of 1.29 mg/dL (H)). Liver Function Tests: No results for input(s): AST, ALT, ALKPHOS, BILITOT, PROT, ALBUMIN in the last 168 hours. No results for input(s): LIPASE, AMYLASE in the last 168 hours. No results for input(s): AMMONIA in the last 168 hours. Coagulation Profile: Recent Labs  Lab 05/12/20 0928 05/15/20 2000 05/16/20 0611 05/16/20 1201  INR 3.1* 3.3* 3.4* 1.4*   Cardiac Enzymes: No results for input(s): CKTOTAL, CKMB, CKMBINDEX, TROPONINI in the last 168 hours. BNP (last 3 results) No results for input(s): PROBNP in the last 8760 hours. HbA1C: No results for input(s): HGBA1C in the last 72 hours. CBG: No results for input(s): GLUCAP in the last 168 hours. Lipid Profile: No results for input(s): CHOL, HDL, LDLCALC, TRIG, CHOLHDL, LDLDIRECT in the last 72 hours. Thyroid Function Tests: No results for input(s):  TSH, T4TOTAL, FREET4, T3FREE, THYROIDAB in the last 72 hours. Anemia Panel: Recent Labs    05/16/20 0611  VITAMINB12 754  FOLATE 10.0  FERRITIN 372*  TIBC 158*  IRON 24*  RETICCTPCT 1.9   Sepsis Labs: No results for input(s): PROCALCITON, LATICACIDVEN in the last 168 hours.  Recent Results (from the past 240 hour(s))  Resp Panel by RT-PCR (Flu A&B, Covid) Nasopharyngeal Swab     Status: None   Collection Time: 05/16/20  8:06 AM   Specimen: Nasopharyngeal Swab; Nasopharyngeal(NP) swabs in vial transport medium  Result Value Ref Range Status   SARS Coronavirus 2 by RT PCR NEGATIVE NEGATIVE Final    Comment: (NOTE) SARS-CoV-2 target nucleic acids are NOT DETECTED.  The SARS-CoV-2 RNA is generally detectable in upper respiratory specimens during the acute phase of infection. The lowest concentration of SARS-CoV-2 viral copies this assay can detect is 138 copies/mL. A negative result does not preclude SARS-Cov-2 infection and should not be used as the sole basis for treatment or other patient management decisions. A  negative result may occur with  improper specimen collection/handling, submission of specimen other than nasopharyngeal swab, presence of viral mutation(s) within the areas targeted by this assay, and inadequate number of viral copies(<138 copies/mL). A negative result must be combined with clinical observations, patient history, and epidemiological information. The expected result is Negative.  Fact Sheet for Patients:  EntrepreneurPulse.com.au  Fact Sheet for Healthcare Providers:  IncredibleEmployment.be  This test is no t yet approved or cleared by the [place] FDA and  has been authorized for detection and/or diagnosis of SARS-CoV-2 by FDA under an Emergency Use Authorization (EUA). This EUA will remain  in effect (meaning this test can be used) for the duration of the COVID-19 declaration under Section 564(b)(1) of the Act, 21 U.S.C.section 360bbb-3(b)(1), unless the authorization is terminated  or revoked sooner.       Influenza A by PCR NEGATIVE NEGATIVE Final   Influenza B by PCR NEGATIVE NEGATIVE Final    Comment: (NOTE) The Xpert Xpress SARS-CoV-2/FLU/RSV plus assay is intended as an aid in the diagnosis of influenza from Nasopharyngeal swab specimens and should not be used as a sole basis for treatment. Nasal washings and aspirates are unacceptable for Xpert Xpress SARS-CoV-2/FLU/RSV testing.  Fact Sheet for Patients: EntrepreneurPulse.com.au  Fact Sheet for Healthcare Providers: IncredibleEmployment.be  This test is not yet approved or cleared by the [place] FDA and has been authorized for detection and/or diagnosis of SARS-CoV-2 by FDA under an Emergency Use Authorization (EUA). This EUA will remain in effect (meaning this test can be used) for the duration of the COVID-19 declaration under Section 564(b)(1) of the Act, 21 U.S.C. section 360bbb-3(b)(1), unless the authorization  is terminated or revoked.  Performed at [facility], [place] [address], [place], [place] [address]      Radiology Studies: CT Abdomen Pelvis W Contrast  Result Date: 05/15/2020 CLINICAL DATA:  Painful bowel movement with rectal bleeding. EXAM: CT ABDOMEN AND PELVIS WITH CONTRAST TECHNIQUE: Multidetector CT imaging of the abdomen and pelvis was performed using the standard protocol following bolus administration of intravenous contrast. CONTRAST:  55mL OMNIPAQUE IOHEXOL 300 MG/ML  SOLN COMPARISON:  April 22, 2020 FINDINGS: Lower chest: Mild areas of atelectasis are seen within the bilateral lung bases. There is an artificial aortic valve. A small pericardial effusion is noted. Hepatobiliary: No focal liver abnormality is seen. Subcentimeter gallstones are seen within the lumen of an otherwise normal-appearing gallbladder. Pancreas: Unremarkable.  No pancreatic ductal dilatation or surrounding inflammatory changes. Spleen: Normal in size without focal abnormality. Adrenals/Urinary Tract: Adrenal glands are unremarkable. A 1.5 cm x 1.3 cm cyst is seen within the lateral aspect of the mid left kidney. Renal cortical thinning is again seen on the right. A 7.1 cm x 4.7 cm lobulated area of low attenuation (approximately 22.12 Hounsfield units) is seen within the anterior aspect of the right kidney. This is seen as an area of subcapsular fluid on the prior study and is increased in size. Additional areas of lobulated low-attenuation are noted within the mid and upper right kidney. This is present on the prior study. A 17 mm nonobstructing renal stone is again seen within the lower pole of the right kidney. Additional smaller renal versus vascular calcifications are noted within the mid right kidney. Mild right-sided perinephric inflammatory fat stranding is seen. A mild amount of contrast is seen within the dependent portion of a normal appearing urinary bladder. Stomach/Bowel: Stomach is within  normal limits. The appendix is not identified. No evidence of bowel dilatation. Vascular/Lymphatic: Aortic atherosclerosis. And inferior vena cava filter is noted. No enlarged abdominal or pelvic lymph nodes. Reproductive: The prostate gland is surgically absent. Other: A 4.7 cm x 3.1 cm collection of fluid and air, with thin surrounding mildly hyperdense rim, is seen within the posterior perirectal region on the left. This represents a new finding. Musculoskeletal: Multilevel degenerative changes seen throughout the lumbar spine. IMPRESSION: 1. 4.7 cm x 3.1 cm left posterior perirectal abscess. 2. Cholelithiasis. 3. Interval increase in size of the suspected right subcapsular fluid collection since the prior exam with additional findings suggestive of right-sided acute pyelonephritis. MRI correlation is recommended. 4. Nonobstructing renal calculus within the right kidney. 5. Aortic atherosclerosis. Aortic Atherosclerosis (ICD10-I70.0). Electronically Signed   By: Virgina Norfolk M.D.   On: 05/15/2020 22:46    Scheduled Meds: . [MAR Hold] acetaminophen  500 mg Oral QHS  . [MAR Hold] amLODipine  2.5 mg Oral Daily  . [MAR Hold] feeding supplement  237 mL Oral BID BM  . [MAR Hold] furosemide  20 mg Oral Daily  . [MAR Hold] isosorbide mononitrate  15 mg Oral Daily  . [MAR Hold] polyethylene glycol  34 g Oral BID  . [MAR Hold] pravastatin  20 mg Oral Daily  . [MAR Hold] vitamin B-12  500 mcg Oral Daily   Continuous Infusions: . sodium chloride 10 mL/hr at 05/16/20 0401  . pantoprozole (PROTONIX) infusion 8 mg/hr (05/16/20 0144)  . [MAR Hold] piperacillin-tazobactam (ZOSYN)  IV       LOS: 0 days   Marylu Lund, MD [facility] Pager On Amion  If 7PM-7AM, please contact night-coverage 05/16/2020, 12:45 PM

## 2020-05-17 ENCOUNTER — Encounter (HOSPITAL_COMMUNITY): Payer: Self-pay | Admitting: General Surgery

## 2020-05-17 DIAGNOSIS — K611 Rectal abscess: Secondary | ICD-10-CM | POA: Diagnosis not present

## 2020-05-17 DIAGNOSIS — K219 Gastro-esophageal reflux disease without esophagitis: Secondary | ICD-10-CM

## 2020-05-17 DIAGNOSIS — I4819 Other persistent atrial fibrillation: Secondary | ICD-10-CM | POA: Diagnosis not present

## 2020-05-17 LAB — COMPREHENSIVE METABOLIC PANEL
ALT: 9 U/L (ref 0–44)
AST: 16 U/L (ref 15–41)
Albumin: 2.3 g/dL — ABNORMAL LOW (ref 3.5–5.0)
Alkaline Phosphatase: 71 U/L (ref 38–126)
Anion gap: 6 (ref 5–15)
BUN: 23 mg/dL (ref 8–23)
CO2: 24 mmol/L (ref 22–32)
Calcium: 9.3 mg/dL (ref 8.9–10.3)
Chloride: 105 mmol/L (ref 98–111)
Creatinine, Ser: 1.3 mg/dL — ABNORMAL HIGH (ref 0.61–1.24)
GFR, Estimated: 53 mL/min — ABNORMAL LOW (ref >60)
Glucose, Bld: 125 mg/dL — ABNORMAL HIGH (ref 70–99)
Potassium: 4.7 mmol/L (ref 3.5–5.1)
Sodium: 135 mmol/L (ref 135–145)
Total Bilirubin: 0.9 mg/dL (ref 0.3–1.2)
Total Protein: 5.8 g/dL — ABNORMAL LOW (ref 6.5–8.1)

## 2020-05-17 LAB — CBC
HCT: 30.9 % — ABNORMAL LOW (ref 39.0–52.0)
Hemoglobin: 9.7 g/dL — ABNORMAL LOW (ref 13.0–17.0)
MCH: 25.1 pg — ABNORMAL LOW (ref 26.0–34.0)
MCHC: 31.4 g/dL (ref 30.0–36.0)
MCV: 79.8 fL — ABNORMAL LOW (ref 80.0–100.0)
Platelets: 294 10*3/uL (ref 150–400)
RBC: 3.87 MIL/uL — ABNORMAL LOW (ref 4.22–5.81)
RDW: 18.4 % — ABNORMAL HIGH (ref 11.5–15.5)
WBC: 10.6 10*3/uL — ABNORMAL HIGH (ref 4.0–10.5)
nRBC: 0 % (ref 0.0–0.2)

## 2020-05-17 LAB — HEMOGLOBIN AND HEMATOCRIT, BLOOD
HCT: 33.4 % — ABNORMAL LOW (ref 39.0–52.0)
Hemoglobin: 10.2 g/dL — ABNORMAL LOW (ref 13.0–17.0)

## 2020-05-17 LAB — PROTIME-INR
INR: 1.2 (ref 0.8–1.2)
Prothrombin Time: 15.2 seconds (ref 11.4–15.2)

## 2020-05-17 MED ORDER — WARFARIN SODIUM 5 MG PO TABS: 5.0000 mg | ORAL_TABLET | Freq: Once | ORAL | Status: DC

## 2020-05-17 NOTE — Progress Notes (Signed)
1 Day Post-Op   Subjective/Chief Complaint: Pt with no pain today    Objective: Vital signs in last 24 hours: Temp:  [97.9 F (36.6 C)-98.2 F (36.8 C)] 98.2 F (36.8 C) (04/11 0419) Pulse Rate:  [59-67] 64 (04/11 0419) Resp:  [13-25] 20 (04/11 0419) BP: (103-140)/(55-92) 117/92 (04/11 0419) SpO2:  [95 %-100 %] 95 % (04/11 0419) Last BM Date: 05/16/20  Intake/Output from previous day: 04/10 0701 - 04/11 0700 In: 1795.3 [P.O.:930; I.V.:765.4; IV Piggyback:99.9] Out: 500 [Urine:500] Intake/Output this shift: No intake/output data recorded.  Gen: AAOx3 Rectal: Packing in place  Lab Results:  Recent Labs    05/16/20 0611 05/17/20 0659  WBC 12.6* 10.6*  HGB 10.4* 9.7*  HCT 32.7* 30.9*  PLT 285 294   BMET Recent Labs    05/16/20 0611 05/17/20 0659  NA 136 135  K 4.7 4.7  CL 103 105  CO2 26 24  GLUCOSE 99 125*  BUN 24* 23  CREATININE 1.29* 1.30*  CALCIUM 8.9 9.3   PT/INR Recent Labs    05/16/20 1201 05/17/20 0659  LABPROT 16.9* 15.2  INR 1.4* 1.2   ABG No results for input(s): PHART, HCO3 in the last 72 hours.  Invalid input(s): PCO2, PO2  Studies/Results: CT Abdomen Pelvis W Contrast  Result Date: 05/15/2020 CLINICAL DATA:  Painful bowel movement with rectal bleeding. EXAM: CT ABDOMEN AND PELVIS WITH CONTRAST TECHNIQUE: Multidetector CT imaging of the abdomen and pelvis was performed using the standard protocol following bolus administration of intravenous contrast. CONTRAST:  46mL OMNIPAQUE IOHEXOL 300 MG/ML  SOLN COMPARISON:  April 22, 2020 FINDINGS: Lower chest: Mild areas of atelectasis are seen within the bilateral lung bases. There is an artificial aortic valve. A small pericardial effusion is noted. Hepatobiliary: No focal liver abnormality is seen. Subcentimeter gallstones are seen within the lumen of an otherwise normal-appearing gallbladder. Pancreas: Unremarkable. No pancreatic ductal dilatation or surrounding inflammatory changes. Spleen:  Normal in size without focal abnormality. Adrenals/Urinary Tract: Adrenal glands are unremarkable. A 1.5 cm x 1.3 cm cyst is seen within the lateral aspect of the mid left kidney. Renal cortical thinning is again seen on the right. A 7.1 cm x 4.7 cm lobulated area of low attenuation (approximately 22.12 Hounsfield units) is seen within the anterior aspect of the right kidney. This is seen as an area of subcapsular fluid on the prior study and is increased in size. Additional areas of lobulated low-attenuation are noted within the mid and upper right kidney. This is present on the prior study. A 17 mm nonobstructing renal stone is again seen within the lower pole of the right kidney. Additional smaller renal versus vascular calcifications are noted within the mid right kidney. Mild right-sided perinephric inflammatory fat stranding is seen. A mild amount of contrast is seen within the dependent portion of a normal appearing urinary bladder. Stomach/Bowel: Stomach is within normal limits. The appendix is not identified. No evidence of bowel dilatation. Vascular/Lymphatic: Aortic atherosclerosis. And inferior vena cava filter is noted. No enlarged abdominal or pelvic lymph nodes. Reproductive: The prostate gland is surgically absent. Other: A 4.7 cm x 3.1 cm collection of fluid and air, with thin surrounding mildly hyperdense rim, is seen within the posterior perirectal region on the left. This represents a new finding. Musculoskeletal: Multilevel degenerative changes seen throughout the lumbar spine. IMPRESSION: 1. 4.7 cm x 3.1 cm left posterior perirectal abscess. 2. Cholelithiasis. 3. Interval increase in size of the suspected right subcapsular fluid collection since the prior  exam with additional findings suggestive of right-sided acute pyelonephritis. MRI correlation is recommended. 4. Nonobstructing renal calculus within the right kidney. 5. Aortic atherosclerosis. Aortic Atherosclerosis (ICD10-I70.0).  Electronically Signed   By: Virgina Norfolk M.D.   On: 05/15/2020 22:46    Anti-infectives: Anti-infectives (From admission, onward)   Start     Dose/Rate Route Frequency Ordered Stop   05/16/20 1800  piperacillin-tazobactam (ZOSYN) IVPB 3.375 g        3.375 g 12.5 mL/hr over 240 Minutes Intravenous Every 8 hours 05/16/20 1335     05/16/20 0800  piperacillin-tazobactam (ZOSYN) IVPB 3.375 g  Status:  Discontinued        3.375 g 12.5 mL/hr over 240 Minutes Intravenous Every 8 hours 05/16/20 0059 05/16/20 1304   05/15/20 2315  piperacillin-tazobactam (ZOSYN) IVPB 3.375 g        3.375 g 100 mL/hr over 30 Minutes Intravenous  Once 05/15/20 2311 05/16/20 0136      Assessment/Plan: s/p Procedure(s): INCISION AND DRAINAGE  PERIRECTAL ABSCESS (N/A) Continue ABX therapy due to Post-op infection x10d -pull packing out tomorrow, no further packing needed -Sitz baths -OK for home when medically cleared   LOS: 1 day    Ralene Ok 05/17/2020

## 2020-05-17 NOTE — Discharge Instructions (Signed)
How to Take a Sitz Bath A sitz bath is a warm water bath that may be used to care for your rectum, genital area, or the area between your rectum and genitals (perineum). In a sitz bath, the water only comes up to your hips and covers your buttocks. A sitz bath may be done in a bathtub or with a portable sitz bath that fits over the toilet. Your health care provider may recommend a sitz bath to help:  Relieve pain and discomfort after delivering a baby.  Relieve pain and itching from hemorrhoids or anal fissures.  Relieve pain after certain surgeries.  Relax muscles that are sore or tight. How to take a sitz bath Take 3-4 sitz baths a day, or as many as told by your health care provider. Bathtub sitz bath To take a sitz bath in a bathtub: 1. Partially fill a bathtub with warm water. The water should be deep enough to cover your hips and buttocks when you are sitting in the tub. 2. Follow your health care provider's instructions if you are told to put medicine in the water. 3. Sit in the water. Open the tub drain a little, and leave it open during your bath. 4. Turn on the warm water again, enough to replace the water that is draining out. Keep the water running throughout your bath. This helps keep the water at the right level and temperature. 5. Soak in the water for 15-20 minutes, or as long as told by your health care provider. 6. When you are done, be careful when you stand up. You may feel dizzy. 7. After the sitz bath, pat yourself dry. Do not rub your skin to dry it.   Over-the-toilet sitz bath To take a sitz bath with an over-the-toilet basin: 1. Follow the manufacturer's instructions. 2. Fill the basin with warm water. 3. Follow your health care provider's instructions if you were told to put medicine in the water. 4. Sit on the seat. Make sure the water covers your buttocks and perineum. 5. Soak in the water for 15-20 minutes, or as long as told by your health care  provider. 6. After the sitz bath, pat yourself dry. Do not rub your skin to dry it. 7. Clean and dry the basin between uses. 8. Discard the basin if it cracks, or according to the manufacturer's instructions.   Contact a health care provider if:  Your pain or itching gets worse. Do not continue with sitz baths if your symptoms get worse.  You have new symptoms. Do not continue with sitz baths until you talk with your health care provider. Summary  A sitz bath is a warm water bath in which the water only comes up to your hips and covers your buttocks.  A sitz bath may help relieve pain and discomfort after delivering a baby. It also may help with pain and itching from hemorrhoids or anal fissures, or pain after certain surgeries. It can also help to relax muscles that are sore or tight.  Take 3-4 sitz baths a day, or as many as told by your health care provider. Soak in the water for 15-20 minutes.  Do not continue with sitz baths if your symptoms get worse. This information is not intended to replace advice given to you by your health care provider. Make sure you discuss any questions you have with your health care provider. Document Revised: 10/09/2019 Document Reviewed: 10/09/2019 Elsevier Patient Education  2021 Elsevier Inc.  

## 2020-05-17 NOTE — Plan of Care (Signed)
Called and spoke with [employer] about pt's device. Pt pacemaker is not MRI Conditional. Per [employer] device specialist. Spoke with Dr Sherrian Divers and informed him of this.

## 2020-05-17 NOTE — Progress Notes (Signed)
Patient had 2 medium stools that were dark and loose. Notified MD. H&H was ordered.

## 2020-05-17 NOTE — Progress Notes (Addendum)
[facility] for Warfarin Indication: history of DVT/PE, atrial fibrillation   Allergies  Allergen Reactions  . Darifenacin Nausea Only and Other (See Comments)    dry mouth and dizziness ENABLEX Dizziness Pt denies  . Codeine Other (See Comments)  . Sulfamethoxazole-Trimethoprim Other (See Comments)  . Lisinopril Cough    Pt denies    Patient Measurements: Height: 6' (182.9 cm) Weight: 97.1 kg (214 lb) IBW/kg (Calculated) : 77.6   Vital Signs: Temp: 98.2 F (36.8 C) (04/11 0419) Temp Source: Oral (04/11 0419) BP: 117/92 (04/11 0419) Pulse Rate: 64 (04/11 0419)  Labs: Recent Labs    05/15/20 2000 05/16/20 0611 05/16/20 1201 05/17/20 0659  HGB 10.4* 10.4*  --  9.7*  HCT 32.8* 32.7*  --  30.9*  PLT 283 285  --  294  LABPROT 32.6* 33.4* 16.9* 15.2  INR 3.3* 3.4* 1.4* 1.2  CREATININE 1.53* 1.29*  --  1.30*    Estimated Creatinine Clearance: 48.4 mL/min (A) (by C-G formula based on SCr of 1.3 mg/dL (H)).  Medications:  PTA warfarin regimen: 2.5mg  PO daily-last dose reported 05/14/2020 at 2100  Assessment: 75 y/oM with PMH of prostate cx s/pprostatectomy, aortic stenosis s/p TAVR, atrial fibrillation, history of recurrent DVT, PE s/p IVC filter on warfarin who presented with rectal bleeding. On admission, Hgb 10.4 (down from 16.2 on 04/27/20), INR supratherapeutic at 3.3. Patient was given Vitamin K 2.5mg  PO x 1 4/9 PM. This morning, INR 3.4. Decision was made for urgent I&D of perirectal abscess, so MD gave Eppie Gibson and Vitamin K 10mg  IV x 1 prior to procedure. INR checked post I&D 1.4. Pharmacy consulted to resume warfarin therapy today. Hgb 10.4, Pltc WNL .  05/17/2020 INR down to 1.2 today as expected after warfarin reversal with Kcentra & 10 mg IV vitamin K yesterday for I&D in OR.  Hg 9.7, PLT 294.  No bleeding reported.  Goal of Therapy:  INR 2-3 Monitor platelets by anticoagulation protocol: Yes   Plan:  Warfarin 5 mg PO x 1  today Daily PT/INR, CBC Monitor very closely for s/sx of bleeding Addendum: Coumadin discontinued by Dr Wyline Copas after d/w Dr. Jeffie Pollock regarding fluid collection in R kidney. He's recommending IR consult for possible drainage of it  NO coumadin today Continue daily INR, CBC  Eudelia Bunch, Pharm.D [phone] 05/17/2020 2:54 PM

## 2020-05-17 NOTE — Consult Note (Addendum)
Chief Complaint: Patient was seen in consultation today for image guided aspiration/drainage of right subcapsular(renal) fluid collection Chief Complaint  Patient presents with  . Rectal Bleeding     Referring Physician(s): Chiu,S  Supervising Physician: Jacqulynn Cadet  Patient Status: [facility] - In-pt  History of Present Illness: [name] is an 87 y.o. male with past medical history significant for prostate carcinoma with prior prostatectomy in 2016, permanent atrial fibrillation, history of recurrent DVT/PE with prior filter placement, on Coumadin, aortic stenosis with prior TAVR, coronary artery disease with prior stenting, GERD, obstructive sleep apnea, hyperlipidemia, hypertensive heart failure, history of heart block with pacemaker, chronic kidney disease who was recently admitted to [facility] on 05/15/20 with rectal pain/blood on toilet tissue with noted drop in hemoglobin and heme positive stools.  He was subsequently found to have a perirectal abscess and underwent I&D on 05/16/2020.  Cultures grew E. coli.  Patient also has a history of a large right kidney stone approximately 1 month ago with UTI but failed treatment with Bactrim.  He underwent CT scan on 3/22 which revealed moderate to severe right perinephric edema and stranding in the right pelvis and proximal ureter but given that his leukocytosis was improving he was asked to continue previous course of antibiotic.  Previous urine cultures  showed no growth.  CT abdomen pelvis performed on 4/9 revealed interval increase in size of the suspected right subcapsular renal fluid collection since prior exam with additional findings suggestive of acute right-sided pyelonephritis.  There was a nonobstructing calculus within the right kidney.  He is currently afebrile, WBC 10.6, hemoglobin 9.7, platelets 294k, creatinine 1.3, PT 15.2, INR 1.4.  COVID-19 negative.  Request now received for image guided aspiration/possible  drainage of the right renal subcapsular fluid collection.  Past Medical History:  Diagnosis Date  . Aortic valve stenosis 04/05/2015   Formatting of this note might be different from the original. Overview:  ECHO 5/15 Formatting of this note might be different from the original. Overview:  Overview:  ECHO 5/15  . Arthritis    "knees, right shoulder" (03/14/2017)  . Atrial fibrillation, persistent ([place]) 09/19/2014  . CAD (coronary artery disease), native coronary artery    Cath 2011 LHC (08/2009):~ Proximal LAD 30%, mid to distal LAD 25%, ostial small D1 75% mid AV groove circumflex 99% been subtotal stenosis, proximal to mid RCA 25-30%, mid RCA 30%, mid PDA 30%, EF 50% with inferior hypokinesis.;    July 2011  PCI and DES to circumflex Dr. Olevia Perches   ETT-Myoview (06/2013):  Inferolateral scar, mild peri-infarct ischemia, EF 40%; Intermediate Risk   ECHO EF 55% 03/2013   . CAD (coronary artery disease), native coronary artery 04/05/2015   Cath 2011 LHC (08/2009):~ Proximal LAD 30%, mid to distal LAD 25%, ostial small D1 75% mid AV groove circumflex 99% been subtotal stenosis, proximal to mid RCA 25-30%, mid RCA 30%, mid PDA 30%, EF 50% with inferior hypokinesis.;    July 2011  PCI and DES to circumflex Dr. Olevia Perches   ETT-Myoview (06/2013):  Inferolateral scar, mild peri-infarct ischemia, EF 40%; Intermediate Risk   ECHO EF 55% 03/2013    . Cardiac pacemaker in situ 03/14/2017   Biventricular St. Jude inserted 03/14/17 Dr. Lovena Le for second degree heart block   . Chronic combined systolic and diastolic heart failure ([place]) 11/19/2017  . CKD (chronic kidney disease), stage III ([place])   . Gastro-esophageal reflux disease without esophagitis 09/24/2009  . GERD   . History  of infection of prosthetic knee 04/05/2015   Treated with debridement and irrigation followed by 6 months of triple antibiotics  . History of kidney stones   . History of peptic ulcer 1970s?  Marland Kitchen History of prostate cancer    Radical prostatectomy in  2006 Dr. Terance Hart   . Hypertensive heart disease   . Internal hemorrhoids 09/11/2018  . Long term (current) use of anticoagulants 10/06/2011  . Mixed hyperlipidemia   . Moderate persistent asthma without complication 4/69/6295  . Noise effect on both inner ears 08/14/2018  . Obesity (BMI 30-39.9)   . OSA on CPAP   . OSA treated with BiPAP 11/15/2015  . Persistent atrial fibrillation ([place]) 09/19/2014   CHA2DS2VASC score 5  Cardioversion 10/08/2014 was on amiodarone until 2018   . 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)    Initial DVT in 2006 after prostate surgery and had [place] filter placed Bilateral  PE 2013 and placed back on warfarin DVT of right subclavian vein on doppler 10/2012 at time of knee infection   . Personal history of malignant neoplasm of prostate    Radical prostatectomy in 2006 Dr. Terance Hart    . Presbycusis of both ears 08/14/2018  . Severe aortic stenosis   . Severe aortic stenosis 03/18/2018  . Urinary incontinence    MULTIPLE BLADDER SURGERIES - STATES NO URINARY SPHINCTER - PT'S UROLOGIST IS AT [facility]- DR. PETERSON  ( LAST VISIT WAS 09/15/11 )    Past Surgical History:  Procedure Laterality Date  . APPENDECTOMY    . BI-VENTRICULAR PACEMAKER INSERTION (CRT-P)  03/14/2017  . BIV PACEMAKER INSERTION CRT-P N/A 03/14/2017   Procedure: BIV PACEMAKER INSERTION CRT-P;  Surgeon: Evans Lance, MD;  Location: [facility];  Service: Cardiovascular;  Laterality: N/A;  . CARDIOVERSION N/A 10/08/2014   Procedure: CARDIOVERSION;  Surgeon: Jacolyn Reedy, MD;  Location[facility];  Service: Cardiovascular;  Laterality: N/A;  . CATARACT EXTRACTION W/ INTRAOCULAR LENS  IMPLANT, BILATERAL Bilateral   . CORONARY ANGIOPLASTY WITH STENT PLACEMENT  08/25/2009   DES-mid LCx 08/2009; 30% pLAD, 25% m/dLAD, 75% ostial D1, 99% mLCx s/p DES, 25-30% p/mRCA, 40% mRCA, 30% mPDA stenoses; LVEF 50%, inf hypokinesis  . CORONARY STENT INTERVENTION N/A 03/18/2018    Procedure: CORONARY STENT INTERVENTION;  Surgeon: Sherren Mocha, MD;  Location: [place] [facility];  Service: Cardiovascular;  Laterality: N/A;  . EXCISIONAL HEMORRHOIDECTOMY    . I & D KNEE WITH POLY EXCHANGE Left 10/09/2012   Procedure: IRRIGATION AND DEBRIDEMENT LEFT KNEE WITH POLY REVISION;  Surgeon: Gearlean Alf, MD;  Location: [facility];  Service: Orthopedics;  Laterality: Left;  . INCISION AND DRAINAGE ABSCESS N/A 05/16/2020   Procedure: INCISION AND DRAINAGE  PERIRECTAL ABSCESS;  Surgeon: Rolm Bookbinder, MD;  Location: [facility];  Service: General;  Laterality: N/A;  . INGUINAL HERNIA REPAIR Right   . INTRAOPERATIVE TRANSTHORACIC ECHOCARDIOGRAM  03/19/2018   Procedure: Intraoperative Transthoracic Echocardiogram;  Surgeon: Sherren Mocha, MD;  Location: [facility];  Service: Open Heart Surgery;;  . JOINT REPLACEMENT    . KNEE ARTHROTOMY Left 10/09/2012   Procedure: LEFT KNEE ARTHROTOMY;  Surgeon: Gearlean Alf, MD;  Location: [facility];  Service: Orthopedics;  Laterality: Left;  . LEFT HEART CATH AND CORONARY ANGIOGRAPHY N/A 02/28/2018   Procedure: LEFT HEART CATH AND CORONARY ANGIOGRAPHY;  Surgeon: Sherren Mocha, MD;  Location: [facility];  Service: Cardiovascular;  Laterality: N/A;  . LEFT HEART CATH AND CORONARY  ANGIOGRAPHY N/A 09/12/2019   Procedure: LEFT HEART CATH AND CORONARY ANGIOGRAPHY;  Surgeon: Sherren Mocha, MD;  Location: [facility];  Service: Cardiovascular;  Laterality: N/A;  . PROSTATECTOMY  04/25/2004  . REPLACEMENT TOTAL KNEE Bilateral   . SKIN BIOPSY     "off nose; wasn't cancer; it was tested" (03/14/2017)  . TRANSCATHETER AORTIC VALVE REPLACEMENT, TRANSFEMORAL N/A 03/19/2018   Procedure: TRANSCATHETER AORTIC VALVE REPLACEMENT, TRANSFEMORAL;  Surgeon: Sherren Mocha, MD;  Location: [facility];  Service: Open Heart Surgery;  Laterality: N/A;  . Uretheral implants     multiple for incontinence  . VENA CAVA FILTER PLACEMENT      Allergies: Darifenacin, Codeine,  Sulfamethoxazole-trimethoprim, and Lisinopril  Medications: Prior to Admission medications   Medication Sig Start Date End Date Taking? Authorizing Provider  acetaminophen (TYLENOL) 500 MG tablet Take 500 mg by mouth at bedtime.    Yes [provider]  amLODipine (NORVASC) 2.5 MG tablet Take 1 tablet (2.5 mg total) by mouth daily. 09/03/19  Yes Tobb, Kardie, DO  Ascorbic Acid (VITAMIN C) 1000 MG tablet Take 1,000 mg by mouth daily.   Yes [provider]  Biotin 2500 MCG CAPS Take 2,500 mcg by mouth daily.   Yes [provider]  cholecalciferol (VITAMIN D) 1000 UNITS tablet Take 1,000 Units by mouth daily.   Yes [provider]  docusate sodium (COLACE) 50 MG capsule Take 50 mg by mouth daily.   Yes [provider]  furosemide (LASIX) 20 MG tablet Take 20 mg by mouth daily.   Yes [provider]  Glucosamine HCl (GLUCOSAMINE PO) Take 2,000 mg by mouth daily.   Yes [provider]  isosorbide mononitrate (IMDUR) 30 MG 24 hr tablet Take 0.5 tablets (15 mg total) by mouth daily. 02/05/20  Yes Richardo Priest, MD  Multiple Vitamin (MULTIVITAMIN) capsule Take 1 capsule by mouth daily.  04/28/19  Yes [provider]  nitroGLYCERIN (NITROSTAT) 0.4 MG SL tablet Place 1 tablet (0.4 mg total) under the tongue every 5 (five) minutes as needed. 07/14/19  Yes Richardo Priest, MD  Omega-3 Fatty Acids (FISH OIL) 1000 MG CAPS Take 1,000 mg by mouth daily.   Yes [provider]  omeprazole (PRILOSEC) 20 MG capsule Take 1 capsule (20 mg total) by mouth daily. 04/21/20  Yes Richardo Priest, MD  pravastatin (PRAVACHOL) 20 MG tablet TAKE ONE TABLET BY MOUTH ONCE DAILY Patient taking differently: Take 20 mg by mouth daily. 01/08/20  Yes Richardo Priest, MD  vitamin B-12 (CYANOCOBALAMIN) 500 MCG tablet Take 500 mcg by mouth daily.   Yes [provider]  warfarin (COUMADIN) 2.5 MG tablet Take 1 tablet daily or as directed by Coumadin  Clinic Patient taking differently: Take 2.5 mg by mouth daily. 04/16/20  Yes Richardo Priest, MD  cefpodoxime (VANTIN) 200 MG tablet Take 1 tablet (200 mg total) by mouth 2 (two) times daily. Patient not taking: No sig reported 04/23/20   Molpus, John, MD     Family History  Problem Relation Age of Onset  . Melanoma Father   . Melanoma Brother     Social History   Socioeconomic History  . Marital status: Married    Spouse name: Not on file  . Number of children: 3  . Years of education: Not on file  . Highest education level: Not on file  Occupational History  . Occupation: Retired Therapist, nutritional  Tobacco Use  . Smoking status: Former Smoker  Packs/day: 1.00    Years: 10.00    Pack years: 10.00    Types: Cigarettes    Quit date: 04/27/1961    Years since quitting: 59.0  . Smokeless tobacco: Never Used  Vaping Use  . Vaping Use: Never used  Substance and Sexual Activity  . Alcohol use: No    Alcohol/week: 0.0 standard drinks  . Drug use: No  . Sexual activity: Not Currently  Other Topics Concern  . Not on file  Social History Narrative  . Not on file   Social Determinants of Health   Financial Resource Strain: Not on file  Food Insecurity: Not on file  Transportation Needs: Not on file  Physical Activity: Not on file  Stress: Not on file  Social Connections: Not on file     Review of Systems currently denies fever, headache, chest pain, worsening dyspnea, cough, abdominal pain, vomiting ; does have some chronic back pain and some recent nausea as well as some dark-colored stools.  Vital Signs: BP 123/72 (BP Location: Right Arm)   Pulse 72   Temp 98 F (36.7 C) (Oral)   Resp 16   Ht 6' (1.829 m)   Wt 214 lb (97.1 kg)   SpO2 95%   BMI 29.02 kg/m   Physical Exam awake, alert.  Chest clear to auscultation bilaterally.  Left chest wall pacer.  Heart with regular paced rhythm.  Positive murmur.  Abdomen soft, positive bowel sounds, nontender.  No significant  lower extremity edema.  Imaging: CT Abdomen Pelvis W Contrast  Result Date: 05/15/2020 CLINICAL DATA:  Painful bowel movement with rectal bleeding. EXAM: CT ABDOMEN AND PELVIS WITH CONTRAST TECHNIQUE: Multidetector CT imaging of the abdomen and pelvis was performed using the standard protocol following bolus administration of intravenous contrast. CONTRAST:  71mL OMNIPAQUE IOHEXOL 300 MG/ML  SOLN COMPARISON:  April 22, 2020 FINDINGS: Lower chest: Mild areas of atelectasis are seen within the bilateral lung bases. There is an artificial aortic valve. A small pericardial effusion is noted. Hepatobiliary: No focal liver abnormality is seen. Subcentimeter gallstones are seen within the lumen of an otherwise normal-appearing gallbladder. Pancreas: Unremarkable. No pancreatic ductal dilatation or surrounding inflammatory changes. Spleen: Normal in size without focal abnormality. Adrenals/Urinary Tract: Adrenal glands are unremarkable. A 1.5 cm x 1.3 cm cyst is seen within the lateral aspect of the mid left kidney. Renal cortical thinning is again seen on the right. A 7.1 cm x 4.7 cm lobulated area of low attenuation (approximately 22.12 Hounsfield units) is seen within the anterior aspect of the right kidney. This is seen as an area of subcapsular fluid on the prior study and is increased in size. Additional areas of lobulated low-attenuation are noted within the mid and upper right kidney. This is present on the prior study. A 17 mm nonobstructing renal stone is again seen within the lower pole of the right kidney. Additional smaller renal versus vascular calcifications are noted within the mid right kidney. Mild right-sided perinephric inflammatory fat stranding is seen. A mild amount of contrast is seen within the dependent portion of a normal appearing urinary bladder. Stomach/Bowel: Stomach is within normal limits. The appendix is not identified. No evidence of bowel dilatation. Vascular/Lymphatic: Aortic  atherosclerosis. And inferior vena cava filter is noted. No enlarged abdominal or pelvic lymph nodes. Reproductive: The prostate gland is surgically absent. Other: A 4.7 cm x 3.1 cm collection of fluid and air, with thin surrounding mildly hyperdense rim, is seen within the posterior perirectal region  on the left. This represents a new finding. Musculoskeletal: Multilevel degenerative changes seen throughout the lumbar spine. IMPRESSION: 1. 4.7 cm x 3.1 cm left posterior perirectal abscess. 2. Cholelithiasis. 3. Interval increase in size of the suspected right subcapsular fluid collection since the prior exam with additional findings suggestive of right-sided acute pyelonephritis. MRI correlation is recommended. 4. Nonobstructing renal calculus within the right kidney. 5. Aortic atherosclerosis. Aortic Atherosclerosis (ICD10-I70.0). Electronically Signed   By: Virgina Norfolk M.D.   On: 05/15/2020 22:46   DG Chest Port 1 View  Result Date: 04/22/2020 CLINICAL DATA:  Fever, sepsis EXAM: PORTABLE CHEST 1 VIEW COMPARISON:  08/24/2019 FINDINGS: Single frontal view of the chest demonstrates a stable cardiac silhouette. Multi lead pacer and aortic valve prosthesis unchanged. No airspace disease, effusion, or pneumothorax. IMPRESSION: 1. No acute intrathoracic process.  Stable exam. Electronically Signed   By: Randa Ngo M.D.   On: 04/22/2020 22:42   CT Renal Stone Study  Result Date: 04/22/2020 CLINICAL DATA:  87 year old with flank pain. Fever. Patient reports recently passed kidney stone. EXAM: CT ABDOMEN AND PELVIS WITHOUT CONTRAST TECHNIQUE: Multidetector CT imaging of the abdomen and pelvis was performed following the standard protocol without IV contrast. COMPARISON:  CT 03/27/2018 FINDINGS: Lower chest: Cardiomegaly. Pacemaker partially included. Prostatic aortic valve. Coronary artery calcifications. Minimal subpleural reticulation at the lung bases. No pleural fluid. Hepatobiliary: No focal hepatic  abnormality on noncontrast exam. Intraluminal gallstones without pericholecystic fat stranding. Mild biliary prominence is likely normal for age. Pancreas: Mild fatty atrophy of the head and uncinate process. No ductal dilatation or inflammation. Spleen: Normal in size without focal abnormality. Adrenals/Urinary Tract: No adrenal nodule. Moderate to severe right perinephric edema. There is stranding in the region of the renal pelvis. No definite hydronephrosis. There is thinning of the right renal parenchyma with upper pole cyst. Nonobstructing stone measuring 17 mm in the lower right kidney. There are additional punctate nonobstructing stones in the mid kidney versus vascular calcifications. Small subcapsular collection about the anterior mid kidney, series 2, image 37, measures approximately 12 mm in thickness. There is no intrarenal renal collecting system air. The right ureter is decompressed, no ureteral stone. Stranding extends from the renal pelvis to the proximal ureter. There is no left hydronephrosis. No left renal calculi. Small left renal cysts. Minimal left perinephric edema. Left ureter is decompressed without stone along the course. Urinary bladder is completely empty and not well assessed. There is likely mild perivesicular fat stranding. Stomach/Bowel: Decompressed stomach. No small bowel obstruction or inflammation. Mild fecalization of small bowel contents. Appendix not visualized, appendectomy per history. Moderate colonic stool burden. There is colonic redundancy. No colonic wall thickening or inflammation. No significant diverticular disease. Vascular/Lymphatic: Advanced aortic atherosclerosis. No aortic aneurysm. Infrarenal IVC filter in place. 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 Small retroperitoneal nodes are not enlarged by size criteria. No pelvic adenopathy. Reproductive: Prostatectomy. Other: Small amount of free fluid in the pelvis. No upper abdominal ascites. No free air. Fat in both inguinal  canals. Musculoskeletal: Scoliosis and advanced degenerative change in the spine. There are no acute or suspicious osseous abnormalities. IMPRESSION: 1. Moderate to severe right perinephric edema. Stranding in the region of the renal pelvis and proximal ureter. No frank hydronephrosis. There are nonobstructing stones in the right kidney but no obstructing or ureteral calculi. Findings may be due to recently passed stone or urinary tract infection. 2. Small subcapsular collection about the anterior mid right kidney measuring up to 12 mm in thickness. This may  represent a subcapsular hematoma, however sterility is indeterminate. 3. Cholelithiasis without gallbladder inflammation. Aortic Atherosclerosis (ICD10-I70.0). Electronically Signed   By: Keith Rake M.D.   On: 04/22/2020 23:50    Labs:  CBC: Recent Labs    04/27/20 1102 05/15/20 2000 05/16/20 0611 05/17/20 0659  WBC 10.8* 11.8* 12.6* 10.6*  HGB 16.2 10.4* 10.4* 9.7*  HCT 49.4 32.8* 32.7* 30.9*  PLT 308 283 285 294    COAGS: Recent Labs    04/22/20 2253 04/27/20 1102 05/15/20 2000 05/16/20 0611 05/16/20 1201 05/17/20 0659  INR 2.7*   < > 3.3* 3.4* 1.4* 1.2  APTT 43*  --   --   --   --   --    < > = values in this interval not displayed.    BMP: Recent Labs    07/14/19 1343 08/24/19 2011 09/08/19 0920 04/22/20 2253 04/27/20 1102 05/15/20 2000 05/16/20 0611 05/17/20 0659  NA 143 139 141   < > 133* 138 136 135  K 4.4 3.9 4.1   < > 4.2 4.2 4.7 4.7  CL 109* 108 106   < > 98 104 103 105  CO2 18* 23 22   < > 22 26 26 24   GLUCOSE 131* 110* 107*   < > 110* 108* 99 125*  BUN 30* 19 22   < > 26* 27* 24* 23  CALCIUM 10.4* 9.6 10.0   < > 10.3 9.3 8.9 9.3  CREATININE 1.66* 1.43* 1.42*   < > 1.36* 1.53* 1.29* 1.30*  GFRNONAA 37* 44* 44*   < > 50* 44* 54* 53*  GFRAA 43* 51* 51*  --   --   --   --   --    < > = values in this interval not displayed.    LIVER FUNCTION TESTS: Recent Labs    07/14/19 1343  04/22/20 2253 04/27/20 1102 05/17/20 0659  BILITOT 0.7 1.6* 1.3* 0.9  AST 26 29 54* 16  ALT 5 12 19 9   ALKPHOS 83 92 103 71  PROT 6.8 7.1 7.5 5.8*  ALBUMIN 4.1 3.2* 3.1* 2.3*    TUMOR MARKERS: No results for input(s): AFPTM, CEA, CA199, CHROMGRNA in the last 8760 hours.  Assessment and Plan: 87 y.o. male with past medical history significant for prostate carcinoma with prior prostatectomy in 2016, permanent atrial fibrillation, history of recurrent DVT/PE with prior filter placement, on Coumadin, aortic stenosis with prior TAVR, coronary artery disease with prior stenting, GERD, obstructive sleep apnea, hyperlipidemia, hypertensive heart failure, history of heart block with pacemaker, chronic kidney disease who was recently admitted to [facility] on 05/15/20 with rectal pain/blood on toilet tissue with noted drop in hemoglobin and heme positive stools.  He was subsequently found to have a perirectal abscess and underwent I&D on 05/16/2020.  Cultures grew E. coli.  Patient also has a history of a large right kidney stone approximately 1 month ago with UTI but failed treatment with Bactrim.  He underwent CT scan on 3/22 which revealed moderate to severe right perinephric edema and stranding in the right pelvis and proximal ureter but given that his leukocytosis was improving he was asked to continue previous course of antibiotic.  Previous urine cultures  showed no growth.  CT abdomen pelvis performed on 4/9 revealed interval increase in size of the suspected right subcapsular renal fluid collection since prior exam with additional findings suggestive of acute right-sided pyelonephritis.  There was a nonobstructing calculus within the right kidney.  He is currently afebrile, WBC 10.6, hemoglobin 9.7, platelets 294k, creatinine 1.3, PT 15.2, INR 1.4.  COVID-19 negative.  Request now received for image guided aspiration/possible drainage of the right renal subcapsular fluid collection.  Latest  imaging studies have been reviewed by Dr. Anselm Pancoast.Risks and benefits discussed with the patient/spouse including bleeding, infection, damage to adjacent structures, bowel perforation/fistula connection, and sepsis.  All of the patient's questions were answered, patient is agreeable to proceed. Consent signed and in chart.  Procedure tentatively scheduled for 4/12.  Hold anticoagulation until after above procedure.   Thank you for this interesting consult.  I greatly enjoyed meeting Nagi P Wiedel and look forward to participating in their care.  A copy of this report was sent to the requesting provider on this date.  Electronically Signed: D. Rowe Robert, PA-C 05/17/2020, 4:19 PM   I spent a total of 30 minutes    in face to face in clinical consultation, greater than 50% of which was counseling/coordinating care for CT-guided aspiration/possible drainage of right renal subcapsular fluid collection

## 2020-05-17 NOTE — Progress Notes (Signed)
Pt refused cpap

## 2020-05-17 NOTE — Progress Notes (Signed)
PROGRESS NOTE    Edras P Radler  TIR:[id number] DOB: Oct 11, 1931 DOA: 05/15/2020 PCP: System, Provider Not In    Brief Narrative:  87 y.o. male with medical history significant hx of prostate cx s/p prostatectomy 2016,  permanent atrial fibrillation, history of recurrent DVT, PE s/p IVC filter on Coumadin, aortic stenosis s/p TAVR, CAD, hypertensive heart failure s/p pacemaker, CKD stage IIIb who presents with concerns of rectal pain, found to have perirectal abscess. Also found to have 6g drop in hgb with heme pos stools on presentation  Assessment & Plan:   Principal Problem:   Perirectal abscess Active Problems:   Hypertensive heart disease   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)   Gastro-esophageal reflux disease without esophagitis   Long term (current) use of anticoagulants   Persistent atrial fibrillation (HCC)   CKD (chronic kidney disease), stage III (HCC)   S/P TAVR (transcatheter aortic valve replacement)   OSA on CPAP   CAD (coronary artery disease), native coronary artery   Atrial fibrillation, persistent (HCC)   Pyelonephritis of right kidney   Presence of IVC filter   Constipation, chronic   Cysts of right kidney   Perirectal abscess -Personally reviewed on CT abd -Discussed case with General Surgery. Agree with INR reversal this AM for surgery -Pt is now s/p I/D, appreciate assistance by General Surgery -Will f/u on culture results, thus far abundant ecoli species noted -Continued on empiric zosyn for now -Pt denies significant post-op pain this afternoon  Anemia/Melena -Suspect acute GI bleed in the setting of coumadin use -Chart reviewed. Pt had been followed closely by [facility] for hx of hemorrhoids in the past -Hgb is down to 9.7 from 16 one month prior to visit. Stools are heme pos -Discussed with on call GI. Given recent surgical drainage of perirectal abscess, recommendation is to hold off on inpatient  endoscopy for now and f/u for outpatient endo when more stable -See below. Coumadin now on hold  Persistent pyelonephritis -Pt noted to have UTI about a month ago placed on Bactrim and later reevaluated in ED and placed on Cefpodoxime. No growth in UA at that time.  -Current cultures remain neg -CT abdomen now shows worsening subcapsular fluid collection -Unable to perform MRI of the abdomen secondary to pacemaker -Discussed with Urology on call. Recommendation for IR consult for consideration of drainage of R subcapsular fluid  Hx of persistent atrial fibrillation CHADVASc score of 5 warfarin remains on hold per above  Hx of recurrent DVT/PE with supratherapeutic INR Patient with history of provoked DVT following prostatectomy with IVC placement afterwards.   He then later developed bacteremia and developed pulmonary embolism and DVT and was placed on warfarin. Admission INR of 3.3 Patient's PE and DVT are noted to be remote. Coumadin on hold given possibility of draining R subcapsular fluid collection above  History of heart block s/p pacemaker Stable  Hx of aortic stenosis s/p TAVR Appears to have a bioprosthetic valve  CAD s/p stent  Remains stable and asymptomatic  CKD stage III Creatinine stable  OSA Continue CPAP  DVT prophylaxis: SCD's Code Status: Full Family Communication: Pt in room, family currently not at bedside  Status is: Inpatient  Patient will require continued inpatient stay because: IV treatments appropriate due to intensity of illness or inability to take PO and Inpatient level of care appropriate due to severity of illness  Dispo: The patient is from: Home  Anticipated d/c is to: Home              Patient currently is not medically stable to d/c.   Difficult to place patient No   Consultants:   General Surgery  Discussed case with GI  Discussed case with Urology  IR  Procedures:   I/D of perirectal abscess  4/10  Antimicrobials: Anti-infectives (From admission, onward)   Start     Dose/Rate Route Frequency Ordered Stop   05/16/20 1800  piperacillin-tazobactam (ZOSYN) IVPB 3.375 g        3.375 g 12.5 mL/hr over 240 Minutes Intravenous Every 8 hours 05/16/20 1335     05/16/20 0800  piperacillin-tazobactam (ZOSYN) IVPB 3.375 g  Status:  Discontinued        3.375 g 12.5 mL/hr over 240 Minutes Intravenous Every 8 hours 05/16/20 0059 05/16/20 1304   05/15/20 2315  piperacillin-tazobactam (ZOSYN) IVPB 3.375 g        3.375 g 100 mL/hr over 30 Minutes Intravenous  Once 05/15/20 2311 05/16/20 0136      Subjective: Reports feeling better today. Denies rectal pain this afternoon  Objective: Vitals:   05/16/20 2011 05/17/20 0419 05/17/20 1239 05/17/20 1432  BP: 130/64 (!) 117/92 123/72 123/72  Pulse: 66 64  72  Resp: 16 20  16   Temp: 97.9 F (36.6 C) 98.2 F (36.8 C)  98 F (36.7 C)  TempSrc:  Oral  Oral  SpO2: 96% 95%  95%  Weight:      Height:        Intake/Output Summary (Last 24 hours) at 05/17/2020 1520 Last data filed at 05/17/2020 1100 Gross per 24 hour  Intake 1244.49 ml  Output 500 ml  Net 744.49 ml   Filed Weights   05/15/20 1923  Weight: 97.1 kg    Examination: General exam: Conversant, in no acute distress Respiratory system: normal chest rise, clear, no audible wheezing Cardiovascular system: regular rhythm, s1-s2 Gastrointestinal system: Nondistended, nontender, pos BS Central nervous system: No seizures, no tremors Extremities: No cyanosis, no joint deformities Skin: No rashes, no pallor Psychiatry: Affect normal // no auditory hallucinations   Data Reviewed: I have personally reviewed following labs and imaging studies  CBC: Recent Labs  Lab 05/15/20 2000 05/16/20 0611 05/17/20 0659  WBC 11.8* 12.6* 10.6*  NEUTROABS 8.9*  --   --   HGB 10.4* 10.4* 9.7*  HCT 32.8* 32.7* 30.9*  MCV 80.4 81.5 79.8*  PLT 283 285 453   Basic Metabolic Panel: Recent  Labs  Lab 05/15/20 2000 05/16/20 0611 05/17/20 0659  NA 138 136 135  K 4.2 4.7 4.7  CL 104 103 105  CO2 26 26 24   GLUCOSE 108* 99 125*  BUN 27* 24* 23  CREATININE 1.53* 1.29* 1.30*  CALCIUM 9.3 8.9 9.3  PHOS  --  3.4  --    GFR: Estimated Creatinine Clearance: 48.4 mL/min (A) (by C-G formula based on SCr of 1.3 mg/dL (H)). Liver Function Tests: Recent Labs  Lab 05/17/20 0659  AST 16  ALT 9  ALKPHOS 71  BILITOT 0.9  PROT 5.8*  ALBUMIN 2.3*   No results for input(s): LIPASE, AMYLASE in the last 168 hours. No results for input(s): AMMONIA in the last 168 hours. Coagulation Profile: Recent Labs  Lab 05/12/20 0928 05/15/20 2000 05/16/20 0611 05/16/20 1201 05/17/20 0659  INR 3.1* 3.3* 3.4* 1.4* 1.2   Cardiac Enzymes: No results for input(s): CKTOTAL, CKMB, CKMBINDEX, TROPONINI in the last 168 hours. BNP (  last 3 results) No results for input(s): PROBNP in the last 8760 hours. HbA1C: No results for input(s): HGBA1C in the last 72 hours. CBG: No results for input(s): GLUCAP in the last 168 hours. Lipid Profile: No results for input(s): CHOL, HDL, LDLCALC, TRIG, CHOLHDL, LDLDIRECT in the last 72 hours. Thyroid Function Tests: No results for input(s): TSH, T4TOTAL, FREET4, T3FREE, THYROIDAB in the last 72 hours. Anemia Panel: Recent Labs    05/16/20 0611  VITAMINB12 754  FOLATE 10.0  FERRITIN 372*  TIBC 158*  IRON 24*  RETICCTPCT 1.9   Sepsis Labs: No results for input(s): PROCALCITON, LATICACIDVEN in the last 168 hours.  Recent Results (from the past 240 hour(s))  Resp Panel by RT-PCR (Flu A&B, Covid) Nasopharyngeal Swab     Status: None   Collection Time: 05/16/20  8:06 AM   Specimen: Nasopharyngeal Swab; Nasopharyngeal(NP) swabs in vial transport medium  Result Value Ref Range Status   SARS Coronavirus 2 by RT PCR NEGATIVE NEGATIVE Final    Comment: (NOTE) SARS-CoV-2 target nucleic acids are NOT DETECTED.  The SARS-CoV-2 RNA is generally detectable  in upper respiratory specimens during the acute phase of infection. The lowest concentration of SARS-CoV-2 viral copies this assay can detect is 138 copies/mL. A negative result does not preclude SARS-Cov-2 infection and should not be used as the sole basis for treatment or other patient management decisions. A negative result may occur with  improper specimen collection/handling, submission of specimen other than nasopharyngeal swab, presence of viral mutation(s) within the areas targeted by this assay, and inadequate number of viral copies(<138 copies/mL). A negative result must be combined with clinical observations, patient history, and epidemiological information. The expected result is Negative.  Fact Sheet for Patients:  EntrepreneurPulse.com.au  Fact Sheet for Healthcare Providers:  IncredibleEmployment.be  This test is no t yet approved or cleared by the [place] FDA and  has been authorized for detection and/or diagnosis of SARS-CoV-2 by FDA under an Emergency Use Authorization (EUA). This EUA will remain  in effect (meaning this test can be used) for the duration of the COVID-19 declaration under Section 564(b)(1) of the Act, 21 U.S.C.section 360bbb-3(b)(1), unless the authorization is terminated  or revoked sooner.       Influenza A by PCR NEGATIVE NEGATIVE Final   Influenza B by PCR NEGATIVE NEGATIVE Final    Comment: (NOTE) The Xpert Xpress SARS-CoV-2/FLU/RSV plus assay is intended as an aid in the diagnosis of influenza from Nasopharyngeal swab specimens and should not be used as a sole basis for treatment. Nasal washings and aspirates are unacceptable for Xpert Xpress SARS-CoV-2/FLU/RSV testing.  Fact Sheet for Patients: EntrepreneurPulse.com.au  Fact Sheet for Healthcare Providers: IncredibleEmployment.be  This test is not yet approved or cleared by the [place] FDA and has  been authorized for detection and/or diagnosis of SARS-CoV-2 by FDA under an Emergency Use Authorization (EUA). This EUA will remain in effect (meaning this test can be used) for the duration of the COVID-19 declaration under Section 564(b)(1) of the Act, 21 U.S.C. section 360bbb-3(b)(1), unless the authorization is terminated or revoked.  Performed at [facility], [place] [address] [place], [place] [address]   Aerobic/Anaerobic Culture w Gram Stain (surgical/deep wound)     Status: None (Preliminary result)   Collection Time: 05/16/20 11:22 AM   Specimen: PATH Other; Tissue  Result Value Ref Range Status   Specimen Description   Final    TISSUE Performed at [facility]  [address] [place], [place] [address]    Special Requests   Final    NONE Performed at [facility], [place] [address], [place], [place] [address]    Gram Stain   Final    MODERATE WBC PRESENT, PREDOMINANTLY PMN FEW GRAM POSITIVE COCCI IN PAIRS MODERATE GRAM NEGATIVE RODS    Culture   Final    ABUNDANT ESCHERICHIA COLI SUSCEPTIBILITIES TO FOLLOW Performed at [facility], [place] [address] [place],  [address]    Report Status PENDING  Incomplete     Radiology Studies: CT Abdomen Pelvis W Contrast  Result Date: 05/15/2020 CLINICAL DATA:  Painful bowel movement with rectal bleeding. EXAM: CT ABDOMEN AND PELVIS WITH CONTRAST TECHNIQUE: Multidetector CT imaging of the abdomen and pelvis was performed using the standard protocol following bolus administration of intravenous contrast. CONTRAST:  45mL OMNIPAQUE IOHEXOL 300 MG/ML  SOLN COMPARISON:  April 22, 2020 FINDINGS: Lower chest: Mild areas of atelectasis are seen within the bilateral lung bases. There is an artificial aortic valve. A small pericardial effusion is noted. Hepatobiliary: No focal liver abnormality is seen. Subcentimeter gallstones are seen within the lumen of an otherwise  normal-appearing gallbladder. Pancreas: Unremarkable. No pancreatic ductal dilatation or surrounding inflammatory changes. Spleen: Normal in size without focal abnormality. Adrenals/Urinary Tract: Adrenal glands are unremarkable. A 1.5 cm x 1.3 cm cyst is seen within the lateral aspect of the mid left kidney. Renal cortical thinning is again seen on the right. A 7.1 cm x 4.7 cm lobulated area of low attenuation (approximately 22.12 Hounsfield units) is seen within the anterior aspect of the right kidney. This is seen as an area of subcapsular fluid on the prior study and is increased in size. Additional areas of lobulated low-attenuation are noted within the mid and upper right kidney. This is present on the prior study. A 17 mm nonobstructing renal stone is again seen within the lower pole of the right kidney. Additional smaller renal versus vascular calcifications are noted within the mid right kidney. Mild right-sided perinephric inflammatory fat stranding is seen. A mild amount of contrast is seen within the dependent portion of a normal appearing urinary bladder. Stomach/Bowel: Stomach is within normal limits. The appendix is not identified. No evidence of bowel dilatation. Vascular/Lymphatic: Aortic atherosclerosis. And inferior vena cava filter is noted. No enlarged abdominal or pelvic lymph nodes. Reproductive: The prostate gland is surgically absent. Other: A 4.7 cm x 3.1 cm collection of fluid and air, with thin surrounding mildly hyperdense rim, is seen within the posterior perirectal region on the left. This represents a new finding. Musculoskeletal: Multilevel degenerative changes seen throughout the lumbar spine. IMPRESSION: 1. 4.7 cm x 3.1 cm left posterior perirectal abscess. 2. Cholelithiasis. 3. Interval increase in size of the suspected right subcapsular fluid collection since the prior exam with additional findings suggestive of right-sided acute pyelonephritis. MRI correlation is recommended. 4.  Nonobstructing renal calculus within the right kidney. 5. Aortic atherosclerosis. Aortic Atherosclerosis (ICD10-I70.0). Electronically Signed   By: Virgina Norfolk M.D.   On: 05/15/2020 22:46    Scheduled Meds: . amLODipine  2.5 mg Oral Daily  . feeding supplement  237 mL Oral BID BM  . furosemide  20 mg Oral Daily  . isosorbide mononitrate  15 mg Oral Daily  . polyethylene glycol  34 g Oral BID  . pravastatin  20 mg Oral Daily  . vitamin B-12  500 mcg Oral Daily   Continuous Infusions: . sodium chloride 10 mL/hr  at 05/16/20 0401  . pantoprozole (PROTONIX) infusion 8 mg/hr (05/17/20 0600)  . piperacillin-tazobactam (ZOSYN)  IV 3.375 g (05/17/20 1243)     LOS: 1 day   Marylu Lund, MD [facility] Pager On Amion  If 7PM-7AM, please contact night-coverage 05/17/2020, 3:20 PM

## 2020-05-18 ENCOUNTER — Inpatient Hospital Stay (HOSPITAL_COMMUNITY): Payer: Medicare Other

## 2020-05-18 DIAGNOSIS — K5909 Other constipation: Secondary | ICD-10-CM | POA: Diagnosis not present

## 2020-05-18 DIAGNOSIS — I4819 Other persistent atrial fibrillation: Secondary | ICD-10-CM | POA: Diagnosis not present

## 2020-05-18 LAB — COMPREHENSIVE METABOLIC PANEL
ALT: 9 U/L (ref 0–44)
AST: 20 U/L (ref 15–41)
Albumin: 2.2 g/dL — ABNORMAL LOW (ref 3.5–5.0)
Alkaline Phosphatase: 69 U/L (ref 38–126)
Anion gap: 8 (ref 5–15)
BUN: 22 mg/dL (ref 8–23)
CO2: 24 mmol/L (ref 22–32)
Calcium: 9.4 mg/dL (ref 8.9–10.3)
Chloride: 104 mmol/L (ref 98–111)
Creatinine, Ser: 1.22 mg/dL (ref 0.61–1.24)
GFR, Estimated: 57 mL/min — ABNORMAL LOW (ref >60)
Glucose, Bld: 124 mg/dL — ABNORMAL HIGH (ref 70–99)
Potassium: 4.5 mmol/L (ref 3.5–5.1)
Sodium: 136 mmol/L (ref 135–145)
Total Bilirubin: 0.8 mg/dL (ref 0.3–1.2)
Total Protein: 5.8 g/dL — ABNORMAL LOW (ref 6.5–8.1)

## 2020-05-18 LAB — CBC
HCT: 31 % — ABNORMAL LOW (ref 39.0–52.0)
Hemoglobin: 9.8 g/dL — ABNORMAL LOW (ref 13.0–17.0)
MCH: 25.5 pg — ABNORMAL LOW (ref 26.0–34.0)
MCHC: 31.6 g/dL (ref 30.0–36.0)
MCV: 80.7 fL (ref 80.0–100.0)
Platelets: 306 10*3/uL (ref 150–400)
RBC: 3.84 MIL/uL — ABNORMAL LOW (ref 4.22–5.81)
RDW: 18.6 % — ABNORMAL HIGH (ref 11.5–15.5)
WBC: 10.5 10*3/uL (ref 4.0–10.5)
nRBC: 0 % (ref 0.0–0.2)

## 2020-05-18 LAB — PROTIME-INR
INR: 1.3 — ABNORMAL HIGH (ref 0.8–1.2)
Prothrombin Time: 15.7 seconds — ABNORMAL HIGH (ref 11.4–15.2)

## 2020-05-18 MED ORDER — MIDAZOLAM HCL 2 MG/2ML IJ SOLN
INTRAMUSCULAR | Status: AC | PRN
  Administered 2020-05-18 (×2): 1 mg via INTRAVENOUS

## 2020-05-18 MED ORDER — MIDAZOLAM HCL 2 MG/2ML IJ SOLN
INTRAMUSCULAR | Status: AC
  Filled 2020-05-18: qty 2

## 2020-05-18 MED ORDER — SODIUM CHLORIDE 0.9 % IV SOLN
INTRAVENOUS | Status: AC
  Filled 2020-05-18: qty 250

## 2020-05-18 MED ORDER — LIDOCAINE HCL (PF) 1 % IJ SOLN
INTRAMUSCULAR | Status: AC | PRN
  Administered 2020-05-18: 10 mL via INTRADERMAL

## 2020-05-18 MED ORDER — HYDROCODONE-ACETAMINOPHEN 5-325 MG PO TABS
1.0000 | ORAL_TABLET | Freq: Four times a day (QID) | ORAL | Status: DC | PRN
  Administered 2020-05-18 – 2020-05-23 (×8): 1 via ORAL
  Filled 2020-05-18 (×8): qty 1

## 2020-05-18 MED ORDER — FENTANYL CITRATE (PF) 100 MCG/2ML IJ SOLN
INTRAMUSCULAR | Status: AC | PRN
  Administered 2020-05-18 (×2): 50 ug via INTRAVENOUS

## 2020-05-18 MED ORDER — FENTANYL CITRATE (PF) 100 MCG/2ML IJ SOLN
INTRAMUSCULAR | Status: AC
  Filled 2020-05-18: qty 2

## 2020-05-18 MED ORDER — HALOPERIDOL LACTATE 5 MG/ML IJ SOLN
5.0000 mg | Freq: Four times a day (QID) | INTRAMUSCULAR | Status: DC | PRN
  Administered 2020-05-18 – 2020-05-24 (×3): 5 mg via INTRAVENOUS
  Filled 2020-05-18 (×3): qty 1

## 2020-05-18 MED ORDER — SODIUM CHLORIDE 0.9% FLUSH
5.0000 mL | Freq: Three times a day (TID) | INTRAVENOUS | Status: DC
  Administered 2020-05-18 – 2020-05-28 (×28): 5 mL

## 2020-05-18 NOTE — Progress Notes (Signed)
PROGRESS NOTE    [name]  GEZ:[id number] DOB: [birth year] DOA: 05/15/2020 PCP: Shirline Frees, MD    Brief Narrative:  87 y.o. male with medical history significant hx of prostate cx s/p prostatectomy 2016,  permanent atrial fibrillation, history of recurrent DVT, PE s/p IVC filter on Coumadin, aortic stenosis s/p TAVR, CAD, hypertensive heart failure s/p pacemaker, CKD stage IIIb who presents with concerns of rectal pain, found to have perirectal abscess. Also found to have 6g drop in hgb with heme pos stools on presentation  Assessment & Plan:   Principal Problem:   Perirectal abscess Active Problems:   Hypertensive heart disease   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)   Gastro-esophageal reflux disease without esophagitis   Long term (current) use of anticoagulants   Persistent atrial fibrillation (HCC)   CKD (chronic kidney disease), stage III (HCC)   S/P TAVR (transcatheter aortic valve replacement)   OSA on CPAP   CAD (coronary artery disease), native coronary artery   Atrial fibrillation, persistent (HCC)   Pyelonephritis of right kidney   Presence of IVC filter   Constipation, chronic   Cysts of right kidney   Perirectal abscess -Personally reviewed on CT abd -Pt is now s/p I/D, appreciate assistance by General Surgery -Will f/u on culture results, thus far abundant ecoli species noted -Will continue empiric zosyn for now -Pt denies significant post-op pain this afternoon  Anemia/Melena -Suspect acute GI bleed in the setting of coumadin use -Chart reviewed. Pt had been followed closely by [facility] for hx of hemorrhoids in the past -Hgb is down to 9.7 from 16 one month prior to visit. Stools are heme pos -Discussed with on call GI. Given recent surgical drainage of perirectal abscess, recommendation is to hold off on inpatient endoscopy for now and f/u for outpatient endo when more stable -See below. Coumadin now on  hold  Persistent pyelonephritis -Pt noted to have UTI about a month ago placed on Bactrim and later reevaluated in ED and placed on Cefpodoxime. No growth in UA at that time.  -Current urine cultures remain neg -CT abdomen now shows worsening subcapsular fluid collection -Unable to perform MRI of the abdomen secondary to pacemaker -Discussed with Urology who recommended drainage by IR -Pt is now s/p drain placement with purulent fluid expressed. F/u on culture results  Hx of persistent atrial fibrillation CHADVASc score of 5 warfarin remains on hold per above per above  Hx of recurrent DVT/PE with supratherapeutic INR Patient with history of provoked DVT following prostatectomy with IVC placement afterwards.   He then later developed bacteremia and developed pulmonary embolism and DVT and was placed on warfarin. Patient's PE and DVT are noted to be remote. Coumadin on hold for now given question of dark stool overnight. Hgb is stable. Will repeat h/h today and CBC in AM  History of heart block s/p pacemaker Stable  Hx of aortic stenosis s/p TAVR Appears to have a bioprosthetic valve  CAD s/p stent  Remains stable and asymptomatic  CKD stage III Creatinine stable  OSA Continue CPAP  DVT prophylaxis: SCD's Code Status: Full Family Communication: Pt in room, family currently not at bedside  Status is: Inpatient  Patient will require continued inpatient stay because: IV treatments appropriate due to intensity of illness or inability to take PO and Inpatient level of care appropriate due to severity of illness  Dispo: The patient is from: Home  Anticipated d/c is to: Home              Patient currently is not medically stable to d/c.   Difficult to place patient No   Consultants:   General Surgery  Discussed case with GI  Discussed case with Urology  IR  Procedures:   I/D of perirectal abscess 4/10  Antimicrobials: Anti-infectives (From  admission, onward)   Start     Dose/Rate Route Frequency Ordered Stop   05/16/20 1800  piperacillin-tazobactam (ZOSYN) IVPB 3.375 g        3.375 g 12.5 mL/hr over 240 Minutes Intravenous Every 8 hours 05/16/20 1335     05/16/20 0800  piperacillin-tazobactam (ZOSYN) IVPB 3.375 g  Status:  Discontinued        3.375 g 12.5 mL/hr over 240 Minutes Intravenous Every 8 hours 05/16/20 0059 05/16/20 1304   05/15/20 2315  piperacillin-tazobactam (ZOSYN) IVPB 3.375 g        3.375 g 100 mL/hr over 30 Minutes Intravenous  Once 05/15/20 2311 05/16/20 0136      Subjective: Complains of mild abd discomfort on R this AM  Objective: Vitals:   05/18/20 1145 05/18/20 1150 05/18/20 1221 05/18/20 1357  BP: (!) 127/97 133/73 133/64 130/73  Pulse: 62 62 60 62  Resp: (!) 22 (!) 24 20 20   Temp:   98.5 F (36.9 C) 97.6 F (36.4 C)  TempSrc:   Oral Oral  SpO2: 98% 98% 96% 96%  Weight:      Height:        Intake/Output Summary (Last 24 hours) at 05/18/2020 1549 Last data filed at 05/18/2020 1150 Gross per 24 hour  Intake 320 ml  Output 135 ml  Net 185 ml   Filed Weights   05/15/20 1923  Weight: 97.1 kg    Examination: General exam: Awake, laying in bed, in nad Respiratory system: Normal respiratory effort, no wheezing Cardiovascular system: regular rate, s1, s2 Gastrointestinal system: Soft, mild R sided abd tenderness Central nervous system: CN2-12 grossly intact, strength intact Extremities: Perfused, no clubbing Skin: Normal skin turgor, no notable skin lesions seen Psychiatry: Mood normal // no visual hallucinations   Data Reviewed: I have personally reviewed following labs and imaging studies  CBC: Recent Labs  Lab 05/15/20 2000 05/16/20 0611 05/17/20 0659 05/17/20 1819 05/18/20 0449  WBC 11.8* 12.6* 10.6*  --  10.5  NEUTROABS 8.9*  --   --   --   --   HGB 10.4* 10.4* 9.7* 10.2* 9.8*  HCT 32.8* 32.7* 30.9* 33.4* 31.0*  MCV 80.4 81.5 79.8*  --  80.7  PLT 283 285 294  --  793    Basic Metabolic Panel: Recent Labs  Lab 05/15/20 2000 05/16/20 0611 05/17/20 0659 05/18/20 0449  NA 138 136 135 136  K 4.2 4.7 4.7 4.5  CL 104 103 105 104  CO2 26 26 24 24   GLUCOSE 108* 99 125* 124*  BUN 27* 24* 23 22  CREATININE 1.53* 1.29* 1.30* 1.22  CALCIUM 9.3 8.9 9.3 9.4  PHOS  --  3.4  --   --    GFR: Estimated Creatinine Clearance: 51.5 mL/min (by C-G formula based on SCr of 1.22 mg/dL). Liver Function Tests: Recent Labs  Lab 05/17/20 0659 05/18/20 0449  AST 16 20  ALT 9 9  ALKPHOS 71 69  BILITOT 0.9 0.8  PROT 5.8* 5.8*  ALBUMIN 2.3* 2.2*   No results for input(s): LIPASE, AMYLASE in the last 168 hours. No results  for input(s): AMMONIA in the last 168 hours. Coagulation Profile: Recent Labs  Lab 05/15/20 2000 05/16/20 0611 05/16/20 1201 05/17/20 0659 05/18/20 0449  INR 3.3* 3.4* 1.4* 1.2 1.3*   Cardiac Enzymes: No results for input(s): CKTOTAL, CKMB, CKMBINDEX, TROPONINI in the last 168 hours. BNP (last 3 results) No results for input(s): PROBNP in the last 8760 hours. HbA1C: No results for input(s): HGBA1C in the last 72 hours. CBG: No results for input(s): GLUCAP in the last 168 hours. Lipid Profile: No results for input(s): CHOL, HDL, LDLCALC, TRIG, CHOLHDL, LDLDIRECT in the last 72 hours. Thyroid Function Tests: No results for input(s): TSH, T4TOTAL, FREET4, T3FREE, THYROIDAB in the last 72 hours. Anemia Panel: Recent Labs    05/16/20 0611  VITAMINB12 754  FOLATE 10.0  FERRITIN 372*  TIBC 158*  IRON 24*  RETICCTPCT 1.9   Sepsis Labs: No results for input(s): PROCALCITON, LATICACIDVEN in the last 168 hours.  Recent Results (from the past 240 hour(s))  Resp Panel by RT-PCR (Flu A&B, Covid) Nasopharyngeal Swab     Status: None   Collection Time: 05/16/20  8:06 AM   Specimen: Nasopharyngeal Swab; Nasopharyngeal(NP) swabs in vial transport medium  Result Value Ref Range Status   SARS Coronavirus 2 by RT PCR NEGATIVE NEGATIVE Final     Comment: (NOTE) SARS-CoV-2 target nucleic acids are NOT DETECTED.  The SARS-CoV-2 RNA is generally detectable in upper respiratory specimens during the acute phase of infection. The lowest concentration of SARS-CoV-2 viral copies this assay can detect is 138 copies/mL. A negative result does not preclude SARS-Cov-2 infection and should not be used as the sole basis for treatment or other patient management decisions. A negative result may occur with  improper specimen collection/handling, submission of specimen other than nasopharyngeal swab, presence of viral mutation(s) within the areas targeted by this assay, and inadequate number of viral copies(<138 copies/mL). A negative result must be combined with clinical observations, patient history, and epidemiological information. The expected result is Negative.  Fact Sheet for Patients:  EntrepreneurPulse.com.au  Fact Sheet for Healthcare Providers:  IncredibleEmployment.be  This test is no t yet approved or cleared by the [place] FDA and  has been authorized for detection and/or diagnosis of SARS-CoV-2 by FDA under an Emergency Use Authorization (EUA). This EUA will remain  in effect (meaning this test can be used) for the duration of the COVID-19 declaration under Section 564(b)(1) of the Act, 21 U.S.C.section 360bbb-3(b)(1), unless the authorization is terminated  or revoked sooner.       Influenza A by PCR NEGATIVE NEGATIVE Final   Influenza B by PCR NEGATIVE NEGATIVE Final    Comment: (NOTE) The Xpert Xpress SARS-CoV-2/FLU/RSV plus assay is intended as an aid in the diagnosis of influenza from Nasopharyngeal swab specimens and should not be used as a sole basis for treatment. Nasal washings and aspirates are unacceptable for Xpert Xpress SARS-CoV-2/FLU/RSV testing.  Fact Sheet for Patients: EntrepreneurPulse.com.au  Fact Sheet for Healthcare  Providers: IncredibleEmployment.be  This test is not yet approved or cleared by the [place] FDA and has been authorized for detection and/or diagnosis of SARS-CoV-2 by FDA under an Emergency Use Authorization (EUA). This EUA will remain in effect (meaning this test can be used) for the duration of the COVID-19 declaration under Section 564(b)(1) of the Act, 21 U.S.C. section 360bbb-3(b)(1), unless the authorization is terminated or revoked.  Performed at [facility], [place] [address], [place], [place] [address]   Aerobic/Anaerobic Culture w Gram  Stain (surgical/deep wound)     Status: None (Preliminary result)   Collection Time: 05/16/20 11:22 AM   Specimen: PATH Other; Tissue  Result Value Ref Range Status   Specimen Description   Final    TISSUE Performed at [place] [address] [place], [place] [address]    Special Requests   Final    NONE Performed at [facility], [place] [address], [place], [place] [address]    Gram Stain   Final    MODERATE WBC PRESENT, PREDOMINANTLY PMN FEW GRAM POSITIVE COCCI IN PAIRS MODERATE GRAM NEGATIVE RODS Performed at [facility], [place] [address] [place], [place] [address]    Culture   Final    ABUNDANT ESCHERICHIA COLI REPEATING SENSITIVITY NO ANAEROBES ISOLATED; CULTURE IN PROGRESS FOR 5 DAYS    Report Status PENDING  Incomplete     Radiology Studies: CT IMAGE GUIDED DRAINAGE BY PERCUTANEOUS CATHETER  Result Date: 05/18/2020 INDICATION: Enlarging subcapsular perinephric fluid collection concerning for abscess. EXAM: CT-guided drain placement MEDICATIONS: The patient is currently admitted to the hospital and receiving intravenous antibiotics. The antibiotics were administered within an appropriate time frame prior to the initiation of the procedure. ANESTHESIA/SEDATION: Fentanyl 50 mcg IV; Versed 1 mg IV Moderate Sedation Time:  12 minutes The  patient was continuously monitored during the procedure by the interventional radiology nurse under my direct supervision. COMPLICATIONS: None immediate. PROCEDURE: Informed written consent was obtained from the patient after a thorough discussion of the procedural risks, benefits and alternatives. All questions were addressed. Maximal Sterile Barrier Technique was utilized including caps, mask, sterile gowns, sterile gloves, sterile drape, hand hygiene and skin antiseptic. A timeout was performed prior to the initiation of the procedure. Planning CT scan of the abdomen was performed. Large subcapsular fluid collection identified in the anterolateral aspect of the right kidney. The patient was repositioned into the left lateral decubitus position. A suitable skin entry site was selected and marked. The overlying skin was sterilely prepped and draped in the standard fashion with chlorhexidine skin prep. Local anesthesia was attained by infiltration with 1% lidocaine. A small dermatotomy was made. Under intermittent CT guidance, an 18 gauge trocar needle was advanced into the fluid collection. A 0.035 wire was then coiled in the collection. The needle was removed. The skin tract was dilated to 12 [place]. A Cook 12 [place] all-purpose drainage catheter was advanced over the wire and formed. Aspiration yields 135 mL thick, purulent fluid. The catheter was gently flushed with saline and connected to JP bulb suction before being secured to the skin with 0 Prolene suture. Follow-up CT imaging demonstrates a well-positioned drainage catheter and significant decompression of the subcapsular perinephric fluid collection. IMPRESSION: Successful placement of 12 French percutaneous drainage catheter with aspiration of 135 mL frankly purulent fluid from the subcapsular perinephric fluid collection. Electronically Signed   By: Jacqulynn Cadet M.D.   On: 05/18/2020 12:46    Scheduled Meds: . amLODipine  2.5 mg Oral Daily  .  feeding supplement  237 mL Oral BID BM  . furosemide  20 mg Oral Daily  . isosorbide mononitrate  15 mg Oral Daily  . polyethylene glycol  34 g Oral BID  . pravastatin  20 mg Oral Daily  . sodium chloride flush  5 mL Intracatheter Q8H  . vitamin B-12  500 mcg Oral Daily   Continuous Infusions: . sodium chloride 10 mL/hr at 05/16/20 0401  . pantoprozole (PROTONIX) infusion 8 mg/hr (05/18/20  1356)  . piperacillin-tazobactam (ZOSYN)  IV 3.375 g (05/18/20 0510)     LOS: 2 days   Marylu Lund, MD [facility] Pager On Amion  If 7PM-7AM, please contact night-coverage 05/18/2020, 3:49 PM

## 2020-05-18 NOTE — Procedures (Signed)
Interventional Radiology Procedure Note  Procedure: 64F drain placed into RIGHT renal subcapsular fluid collection yielding 135 mL thick purulent fluid.  Samples sent for culture.   Complications: None  Estimated Blood Loss: None  Recommendations: - Drain to JP - Flush BID  Signed,  Criselda Peaches, MD

## 2020-05-18 NOTE — Progress Notes (Signed)
Spoke with patient's wife due to patient refusing care. Wife stated that patient had a loot done today, and that she wants him to be left alone if he doesn't want to be bothered. Patient is refusing lab draws and medications at this time. Patient is in bed with call light in reach. Will continue to monitor. Shirlyn Goltz

## 2020-05-18 NOTE — Progress Notes (Signed)
2 Days Post-Op   Subjective/Chief Complaint: No new complaints. Denies pain. Having BMs. Packing fell out last night. Going for IR procedure today to drain perinephric collection.  Objective: Vital signs in last 24 hours: Temp:  [97.9 F (36.6 C)-98 F (36.7 C)] 98 F (36.7 C) (04/12 0318) Pulse Rate:  [63-72] 63 (04/12 0318) Resp:  [15-18] 18 (04/12 0318) BP: (123-141)/(63-72) 141/66 (04/12 0318) SpO2:  [94 %-97 %] 94 % (04/12 0318) Last BM Date: 05/17/20  Intake/Output from previous day: 04/11 0701 - 04/12 0700 In: 483.7 [P.O.:420; I.V.:63.7] Out: -  Intake/Output this shift: No intake/output data recorded.  Gen: AAOx3 Rectal: patient had a non-bloody, brown stool. Backing has been removed from the wound. There is a small 2.5 cm surgical incision in the left perianal area without induration or cellulitis. Draining small amt purulence.   Lab Results:  Recent Labs    05/17/20 0659 05/17/20 1819 05/18/20 0449  WBC 10.6*  --  10.5  HGB 9.7* 10.2* 9.8*  HCT 30.9* 33.4* 31.0*  PLT 294  --  306   BMET Recent Labs    05/17/20 0659 05/18/20 0449  NA 135 136  K 4.7 4.5  CL 105 104  CO2 24 24  GLUCOSE 125* 124*  BUN 23 22  CREATININE 1.30* 1.22  CALCIUM 9.3 9.4   PT/INR Recent Labs    05/17/20 0659 05/18/20 0449  LABPROT 15.2 15.7*  INR 1.2 1.3*   ABG No results for input(s): PHART, HCO3 in the last 72 hours.  Invalid input(s): PCO2, PO2  Studies/Results: No results found.  Anti-infectives: Anti-infectives (From admission, onward)   Start     Dose/Rate Route Frequency Ordered Stop   05/16/20 1800  piperacillin-tazobactam (ZOSYN) IVPB 3.375 g        3.375 g 12.5 mL/hr over 240 Minutes Intravenous Every 8 hours 05/16/20 1335     05/16/20 0800  piperacillin-tazobactam (ZOSYN) IVPB 3.375 g  Status:  Discontinued        3.375 g 12.5 mL/hr over 240 Minutes Intravenous Every 8 hours 05/16/20 0059 05/16/20 1304   05/15/20 2315  piperacillin-tazobactam  (ZOSYN) IVPB 3.375 g        3.375 g 100 mL/hr over 30 Minutes Intravenous  Once 05/15/20 2311 05/16/20 0136      Assessment/Plan: s/p Procedure(s): INCISION AND DRAINAGE  PERIRECTAL ABSCESS (N/A) Continue ABX therapy due to Post-op infection x10d -perirectal abscess clean and draining  -Sitz baths TID and PRN for contamination with stool - CCS will sign off. Call as needed. Follow up for wound check provided. - OK for home when medically cleared   LOS: 2 days    Jill Alexanders 05/18/2020

## 2020-05-18 NOTE — Progress Notes (Signed)
Continues to decline the use of nocturnal CPAP

## 2020-05-19 DIAGNOSIS — K611 Rectal abscess: Secondary | ICD-10-CM | POA: Diagnosis not present

## 2020-05-19 DIAGNOSIS — Z95828 Presence of other vascular implants and grafts: Secondary | ICD-10-CM

## 2020-05-19 DIAGNOSIS — N12 Tubulo-interstitial nephritis, not specified as acute or chronic: Secondary | ICD-10-CM

## 2020-05-19 DIAGNOSIS — N151 Renal and perinephric abscess: Secondary | ICD-10-CM

## 2020-05-19 DIAGNOSIS — I25118 Atherosclerotic heart disease of native coronary artery with other forms of angina pectoris: Secondary | ICD-10-CM | POA: Diagnosis not present

## 2020-05-19 DIAGNOSIS — Z1612 Extended spectrum beta lactamase (ESBL) resistance: Secondary | ICD-10-CM

## 2020-05-19 DIAGNOSIS — N1831 Chronic kidney disease, stage 3a: Secondary | ICD-10-CM | POA: Diagnosis not present

## 2020-05-19 DIAGNOSIS — I4819 Other persistent atrial fibrillation: Secondary | ICD-10-CM | POA: Diagnosis not present

## 2020-05-19 DIAGNOSIS — Z86718 Personal history of other venous thrombosis and embolism: Secondary | ICD-10-CM

## 2020-05-19 DIAGNOSIS — A499 Bacterial infection, unspecified: Secondary | ICD-10-CM

## 2020-05-19 DIAGNOSIS — Z7901 Long term (current) use of anticoagulants: Secondary | ICD-10-CM

## 2020-05-19 LAB — CBC
HCT: 34.5 % — ABNORMAL LOW (ref 39.0–52.0)
Hemoglobin: 10.7 g/dL — ABNORMAL LOW (ref 13.0–17.0)
MCH: 25.2 pg — ABNORMAL LOW (ref 26.0–34.0)
MCHC: 31 g/dL (ref 30.0–36.0)
MCV: 81.4 fL (ref 80.0–100.0)
Platelets: 326 10*3/uL (ref 150–400)
RBC: 4.24 MIL/uL (ref 4.22–5.81)
RDW: 18.6 % — ABNORMAL HIGH (ref 11.5–15.5)
WBC: 13.6 10*3/uL — ABNORMAL HIGH (ref 4.0–10.5)
nRBC: 0 % (ref 0.0–0.2)

## 2020-05-19 LAB — COMPREHENSIVE METABOLIC PANEL
ALT: 8 U/L (ref 0–44)
AST: 16 U/L (ref 15–41)
Albumin: 2.4 g/dL — ABNORMAL LOW (ref 3.5–5.0)
Alkaline Phosphatase: 68 U/L (ref 38–126)
Anion gap: 7 (ref 5–15)
BUN: 20 mg/dL (ref 8–23)
CO2: 26 mmol/L (ref 22–32)
Calcium: 9.3 mg/dL (ref 8.9–10.3)
Chloride: 104 mmol/L (ref 98–111)
Creatinine, Ser: 1.4 mg/dL — ABNORMAL HIGH (ref 0.61–1.24)
GFR, Estimated: 49 mL/min — ABNORMAL LOW (ref >60)
Glucose, Bld: 135 mg/dL — ABNORMAL HIGH (ref 70–99)
Potassium: 4.8 mmol/L (ref 3.5–5.1)
Sodium: 137 mmol/L (ref 135–145)
Total Bilirubin: 1.1 mg/dL (ref 0.3–1.2)
Total Protein: 6 g/dL — ABNORMAL LOW (ref 6.5–8.1)

## 2020-05-19 LAB — PROTIME-INR
INR: 1.5 — ABNORMAL HIGH (ref 0.8–1.2)
Prothrombin Time: 18.5 seconds — ABNORMAL HIGH (ref 11.4–15.2)

## 2020-05-19 MED ORDER — SODIUM CHLORIDE 0.9 % IV SOLN
1.0000 g | Freq: Two times a day (BID) | INTRAVENOUS | Status: DC
  Administered 2020-05-19 – 2020-05-22 (×6): 1 g via INTRAVENOUS
  Filled 2020-05-19: qty 0.17
  Filled 2020-05-19 (×4): qty 1
  Filled 2020-05-19: qty 0.17

## 2020-05-19 NOTE — Progress Notes (Signed)
Pt refused CPAP qhs.  Pt encouraged to contact RT should he change his mind.   

## 2020-05-19 NOTE — Progress Notes (Signed)
PT Cancellation Note  Patient Details Name: [name] MRN: [id number] DOB: 1931/03/31   Cancelled Treatment:    Reason Eval/Treat Not Completed: Fatigue/lethargy limiting ability to participate;Patient at procedure or test/unavailable (attempted to see pt 3x this morning, pt had other providers in the room (MD, IR), then was too fatigued to participate in PT, his wife requested he be allowed to rest and PT attempt later. Will follow.)  Philomena Doheny PT 05/19/2020  Acute Rehabilitation Services Pager [phone] Office [phone]

## 2020-05-19 NOTE — Progress Notes (Signed)
Pharmacy Antibiotic Note  [name] is a 88 y.o. male with hx CKD and recurrent DVT/PE on warfarin PTA, presented to the ED on 4/22 with c/o rectal bleeding.  Abdominal CT on 4/9 showed left posterior perirectal abscess and findings with concern for right-sided acute pyelonephritis.  He underwent I&D of perirectal abscess on 4/10 and drain placed into right renal subcapsular fluid collection that yielded thick purulent fluid. He's currently on zosyn for infection.  Microbiology results:  4/10 Resp panel: neg 4/10 surgical/deep wound cx: abundant E coli (suscept. Pending) 4/12 right kidney abscess:    Today, 05/19/2020: - day #4 abx - afeb, wbc up 13.6 - scr up 1.40 (crcl~45)  Plan: -continue zosyn 3.375 gm IV q8h (infuse over 4 hrs) - f/u culture results, renal function and clinical status _________________________________________  Height: 6' (182.9 cm) Weight: 97.1 kg (214 lb) IBW/kg (Calculated) : 77.6  Temp (24hrs), Avg:97.7 F (36.5 C), Min:97.6 F (36.4 C), Max:97.8 F (36.6 C)  Recent Labs  Lab 05/15/20 2000 05/16/20 0611 05/17/20 0659 05/18/20 0449 05/19/20 0515  WBC 11.8* 12.6* 10.6* 10.5 13.6*  CREATININE 1.53* 1.29* 1.30* 1.22 1.40*    Estimated Creatinine Clearance: 44.9 mL/min (A) (by C-G formula based on SCr of 1.4 mg/dL (H)).    Allergies  Allergen Reactions  . Darifenacin Nausea Only and Other (See Comments)    dry mouth and dizziness ENABLEX Dizziness Pt denies  . Codeine Other (See Comments)  . Sulfamethoxazole-Trimethoprim Other (See Comments)  . Lisinopril Cough    Pt denies   Antimicrobials this admission:  4/9 Zosyn >>  Microbiology results:  4/10 Resp panel: neg 4/10 surgical/deep wound cx: abundant E coli 4/12 right kidney abscess:    Thank you for allowing pharmacy to be a part of this patient's care.  Lynelle Doctor 05/19/2020 1:06 PM

## 2020-05-19 NOTE — Progress Notes (Signed)
Tele called and said pt had 8 beat run of Vtach. Pt is resting in bed, arouses easily, no complaints voiced. Notified Dr. Grandville Silos.

## 2020-05-19 NOTE — Care Management Important Message (Signed)
Important Message  Patient Details IM Letter given to the Patient. Name: [name] MRN: [id number] Date of Birth: 1931/11/29   Medicare Important Message Given:  Yes     Kerin Salen 05/19/2020, 2:14 PM

## 2020-05-19 NOTE — Progress Notes (Signed)
Referring Physician(s): Chiu,S  Supervising Physician: Mir, Sharen Heck  Patient Status:  [name] - In-pt  Chief Complaint: Right renal subcapsular abscess   Subjective: Patient doing fairly well today.  Denies worsening right flank pain, fever, chills, worsening respiratory issues.  Would like to avoid multiple blood draws per wife   Allergies: Darifenacin, Codeine, Sulfamethoxazole-trimethoprim, and Lisinopril  Medications: Prior to Admission medications   Medication Sig Start Date End Date Taking? Authorizing Provider  acetaminophen (TYLENOL) 500 MG tablet Take 500 mg by mouth at bedtime.    Yes [provider]  amLODipine (NORVASC) 2.5 MG tablet Take 1 tablet (2.5 mg total) by mouth daily. 09/03/19  Yes Tobb, Kardie, DO  Ascorbic Acid (VITAMIN C) 1000 MG tablet Take 1,000 mg by mouth daily.   Yes [provider]  Biotin 2500 MCG CAPS Take 2,500 mcg by mouth daily.   Yes [provider]  cholecalciferol (VITAMIN D) 1000 UNITS tablet Take 1,000 Units by mouth daily.   Yes [provider]  docusate sodium (COLACE) 50 MG capsule Take 50 mg by mouth daily.   Yes [provider]  furosemide (LASIX) 20 MG tablet Take 20 mg by mouth daily.   Yes [provider]  Glucosamine HCl (GLUCOSAMINE PO) Take 2,000 mg by mouth daily.   Yes [provider]  isosorbide mononitrate (IMDUR) 30 MG 24 hr tablet Take 0.5 tablets (15 mg total) by mouth daily. 02/05/20  Yes Richardo Priest, MD  Multiple Vitamin (MULTIVITAMIN) capsule Take 1 capsule by mouth daily.  04/28/19  Yes [provider]  nitroGLYCERIN (NITROSTAT) 0.4 MG SL tablet Place 1 tablet (0.4 mg total) under the tongue every 5 (five) minutes as needed. 07/14/19  Yes Richardo Priest, MD  Omega-3 Fatty Acids (FISH OIL) 1000 MG CAPS Take 1,000 mg by mouth daily.   Yes [provider]  omeprazole (PRILOSEC) 20 MG capsule Take 1 capsule (20 mg total) by mouth daily.  04/21/20  Yes Richardo Priest, MD  pravastatin (PRAVACHOL) 20 MG tablet TAKE ONE TABLET BY MOUTH ONCE DAILY Patient taking differently: Take 20 mg by mouth daily. 01/08/20  Yes Richardo Priest, MD  vitamin B-12 (CYANOCOBALAMIN) 500 MCG tablet Take 500 mcg by mouth daily.   Yes [provider]  warfarin (COUMADIN) 2.5 MG tablet Take 1 tablet daily or as directed by [facility] Patient taking differently: Take 2.5 mg by mouth daily. 04/16/20  Yes Richardo Priest, MD  cefpodoxime (VANTIN) 200 MG tablet Take 1 tablet (200 mg total) by mouth 2 (two) times daily. Patient not taking: No sig reported 04/23/20   Molpus, Jenny Reichmann, MD     Vital Signs: BP (!) 150/69 (BP Location: Right Arm)   Pulse 60   Temp 97.8 F (36.6 C) (Oral)   Resp 20   Ht 6' (1.829 m)   Wt 214 lb (97.1 kg)   SpO2 93%   BMI 29.02 kg/m   Physical Exam awake but occasionally drifts off to sleep.  Wife and daughter in room.  Right renal drain intact, insertion site okay, minimal tenderness to palpation, output over 1 L of turbid pink-colored fluid.  Imaging: CT Abdomen Pelvis W Contrast  Result Date: 05/15/2020 CLINICAL DATA:  Painful bowel movement with rectal bleeding. EXAM: CT ABDOMEN AND PELVIS WITH CONTRAST TECHNIQUE: Multidetector CT imaging of the abdomen and pelvis was performed using the standard protocol following bolus administration of intravenous contrast. CONTRAST:  48mL OMNIPAQUE IOHEXOL 300 MG/ML  SOLN  COMPARISON:  April 22, 2020 FINDINGS: Lower chest: Mild areas of atelectasis are seen within the bilateral lung bases. There is an artificial aortic valve. A small pericardial effusion is noted. Hepatobiliary: No focal liver abnormality is seen. Subcentimeter gallstones are seen within the lumen of an otherwise normal-appearing gallbladder. Pancreas: Unremarkable. No pancreatic ductal dilatation or surrounding inflammatory changes. Spleen: Normal in size without focal abnormality. Adrenals/Urinary Tract: Adrenal  glands are unremarkable. A 1.5 cm x 1.3 cm cyst is seen within the lateral aspect of the mid left kidney. Renal cortical thinning is again seen on the right. A 7.1 cm x 4.7 cm lobulated area of low attenuation (approximately 22.12 Hounsfield units) is seen within the anterior aspect of the right kidney. This is seen as an area of subcapsular fluid on the prior study and is increased in size. Additional areas of lobulated low-attenuation are noted within the mid and upper right kidney. This is present on the prior study. A 17 mm nonobstructing renal stone is again seen within the lower pole of the right kidney. Additional smaller renal versus vascular calcifications are noted within the mid right kidney. Mild right-sided perinephric inflammatory fat stranding is seen. A mild amount of contrast is seen within the dependent portion of a normal appearing urinary bladder. Stomach/Bowel: Stomach is within normal limits. The appendix is not identified. No evidence of bowel dilatation. Vascular/Lymphatic: Aortic atherosclerosis. And inferior vena cava filter is noted. No enlarged abdominal or pelvic lymph nodes. Reproductive: The prostate gland is surgically absent. Other: A 4.7 cm x 3.1 cm collection of fluid and air, with thin surrounding mildly hyperdense rim, is seen within the posterior perirectal region on the left. This represents a new finding. Musculoskeletal: Multilevel degenerative changes seen throughout the lumbar spine. IMPRESSION: 1. 4.7 cm x 3.1 cm left posterior perirectal abscess. 2. Cholelithiasis. 3. Interval increase in size of the suspected right subcapsular fluid collection since the prior exam with additional findings suggestive of right-sided acute pyelonephritis. MRI correlation is recommended. 4. Nonobstructing renal calculus within the right kidney. 5. Aortic atherosclerosis. Aortic Atherosclerosis (ICD10-I70.0). Electronically Signed   By: Virgina Norfolk M.D.   On: 05/15/2020 22:46   CT  IMAGE GUIDED DRAINAGE BY PERCUTANEOUS CATHETER  Result Date: 05/18/2020 INDICATION: Enlarging subcapsular perinephric fluid collection concerning for abscess. EXAM: CT-guided drain placement MEDICATIONS: The patient is currently admitted to the [name] and receiving intravenous antibiotics. The antibiotics were administered within an appropriate time frame prior to the initiation of the procedure. ANESTHESIA/SEDATION: Fentanyl 50 mcg IV; Versed 1 mg IV Moderate Sedation Time:  12 minutes The patient was continuously monitored during the procedure by the interventional radiology nurse under my direct supervision. COMPLICATIONS: None immediate. PROCEDURE: Informed written consent was obtained from the patient after a thorough discussion of the procedural risks, benefits and alternatives. All questions were addressed. Maximal Sterile Barrier Technique was utilized including caps, mask, sterile gowns, sterile gloves, sterile drape, hand hygiene and skin antiseptic. A timeout was performed prior to the initiation of the procedure. Planning CT scan of the abdomen was performed. Large subcapsular fluid collection identified in the anterolateral aspect of the right kidney. The patient was repositioned into the left lateral decubitus position. A suitable skin entry site was selected and marked. The overlying skin was sterilely prepped and draped in the standard fashion with chlorhexidine skin prep. Local anesthesia was attained by infiltration with 1% lidocaine. A small dermatotomy was made. Under intermittent CT guidance, an 18 gauge trocar needle was advanced into the  fluid collection. A 0.035 wire was then coiled in the collection. The needle was removed. The skin tract was dilated to 12 [place]. A Cook 12 [place] all-purpose drainage catheter was advanced over the wire and formed. Aspiration yields 135 mL thick, purulent fluid. The catheter was gently flushed with saline and connected to JP bulb suction before being  secured to the skin with 0 Prolene suture. Follow-up CT imaging demonstrates a well-positioned drainage catheter and significant decompression of the subcapsular perinephric fluid collection. IMPRESSION: Successful placement of 12 French percutaneous drainage catheter with aspiration of 135 mL frankly purulent fluid from the subcapsular perinephric fluid collection. Electronically Signed   By: Jacqulynn Cadet M.D.   On: 05/18/2020 12:46    Labs:  CBC: Recent Labs    05/16/20 0611 05/17/20 0659 05/17/20 1819 05/18/20 0449 05/19/20 0515  WBC 12.6* 10.6*  --  10.5 13.6*  HGB 10.4* 9.7* 10.2* 9.8* 10.7*  HCT 32.7* 30.9* 33.4* 31.0* 34.5*  PLT 285 294  --  306 326    COAGS: Recent Labs    04/22/20 2253 04/27/20 1102 05/16/20 1201 05/17/20 0659 05/18/20 0449 05/19/20 0515  INR 2.7*   < > 1.4* 1.2 1.3* 1.5*  APTT 43*  --   --   --   --   --    < > = values in this interval not displayed.    BMP: Recent Labs    07/14/19 1343 08/24/19 2011 09/08/19 0920 04/22/20 2253 05/16/20 0611 05/17/20 0659 05/18/20 0449 05/19/20 0515  NA 143 139 141   < > 136 135 136 137  K 4.4 3.9 4.1   < > 4.7 4.7 4.5 4.8  CL 109* 108 106   < > 103 105 104 104  CO2 18* 23 22   < > 26 24 24 26   GLUCOSE 131* 110* 107*   < > 99 125* 124* 135*  BUN 30* 19 22   < > 24* 23 22 20   CALCIUM 10.4* 9.6 10.0   < > 8.9 9.3 9.4 9.3  CREATININE 1.66* 1.43* 1.42*   < > 1.29* 1.30* 1.22 1.40*  GFRNONAA 37* 44* 44*   < > 54* 53* 57* 49*  GFRAA 43* 51* 51*  --   --   --   --   --    < > = values in this interval not displayed.    LIVER FUNCTION TESTS: Recent Labs    04/27/20 1102 05/17/20 0659 05/18/20 0449 05/19/20 0515  BILITOT 1.3* 0.9 0.8 1.1  AST 54* 16 20 16   ALT 19 9 9 8   ALKPHOS 103 71 69 68  PROT 7.5 5.8* 5.8* 6.0*  ALBUMIN 3.1* 2.3* 2.2* 2.4*    Assessment and Plan: Patient with history of recent I&D of perirectal abscess, right nephrolithiasis/ pyelonephritis with enlarging right renal  subcapsular fluid collection, status post drain placement on 4/12; afebrile, WBC 13.6 up from 10.5, hemoglobin 10.7 up from 9.8, creatinine 1.4 up from 1.22, drain fluid cultures pending but growing gram-negative rods; continue with drain irrigation, output/lab monitoring.  Encourage hydration; once drain output minimal or if clinical status worsens, obtain follow-up CT; encourage urology follow-up.   Electronically Signed: D. Rowe Robert, PA-C 05/19/2020, 1:07 PM   I spent a total of 15 minutes at the the patient's bedside AND on the patient's [name] floor or unit, greater than 50% of which was counseling/coordinating care for right renal subcapsular abscess drain    Patient ID: [place], male  DOB: [birth year], 87 y.o.   MRN: [id number]

## 2020-05-19 NOTE — Progress Notes (Signed)
[facility]    Date of Admission:  05/15/2020     Reason for Consult: Perirectal and renal abscess     Referring Physician: Dr. Grandville Silos  Current antibiotics: Piperacillin tazobactam (4/9--present)  Previous antibiotics: None   ASSESSMENT:    1. Perirectal abscess status post I&D 4/10 2. Pyelonephritis, right renal subscapular abscess status post IR drain placement 4/12 3. ESBL infection 4. Nonobstructing nephrolithiasis 5. CKD  PLAN:    . Switch antibiotics from piperacillin tazobactam to meropenem dosed for current renal function due to ESBL infection . Follow-up cultures from right renal subscapular abscess . Contact precautions . Monitor drain output  . Will follow   Principal Problem:   Perirectal abscess Active Problems:   Hypertensive heart disease   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)   Gastro-esophageal reflux disease without esophagitis   Long term (current) use of anticoagulants   Persistent atrial fibrillation (HCC)   CKD (chronic kidney disease), stage III (HCC)   S/P TAVR (transcatheter aortic valve replacement)   OSA on CPAP   CAD (coronary artery disease), native coronary artery   Atrial fibrillation, persistent (HCC)   Pyelonephritis of right kidney   Presence of IVC filter   Constipation, chronic   Cysts of right kidney   Renal abscess   MEDICATIONS:    Scheduled Meds: . amLODipine  2.5 mg Oral Daily  . feeding supplement  237 mL Oral BID BM  . furosemide  20 mg Oral Daily  . isosorbide mononitrate  15 mg Oral Daily  . polyethylene glycol  34 g Oral BID  . pravastatin  20 mg Oral Daily  . sodium chloride flush  5 mL Intracatheter Q8H  . vitamin B-12  500 mcg Oral Daily   Continuous Infusions: . sodium chloride 10 mL/hr at 05/16/20 0401  . meropenem (MERREM) IV     PRN Meds:.acetaminophen, haloperidol lactate, HYDROcodone-acetaminophen  HPI:    [name]  is a 87 y.o. male with past medical history significant for prostate cancer status post prostatectomy (2016), atrial fibrillation, history of recurrent DVT, PE status post IVC filter on anticoagulation, aortic stenosis status post TAVR, coronary artery disease, hypertension, CKD, kidney stones pacemaker in place who presented with rectal pain on 05/15/2020.  He is seen this afternoon with his wife and daughter at the bedside and has some slight confusion.  Patient reports that for approximately 1 week prior to admission he had felt more constipated.  He then noted some darker stools and began to have sharp rectal pain with straining which prompted him to present to the emergency department.  When he presented he was found to be afebrile, normotensive, and on room air.  His hemoglobin had dropped from 16 several weeks ago down to 10.4, he had leukocytosis of 11.8, his INR was supratherapeutic at 3.3.  He underwent CT of the abdomen and pelvis which showed a left posterior perirectal abscess and interval increase in size of a right subscapular fluid collection suggestive of right-sided pyelonephritis.  Of note per HPI, about 1 month ago patient had a large right kidney stone and UTI that failed treatment with Bactrim.  He presented to the emergency department on 04/22/2020 and was discharged on Cefpodoxime with CT abdomen and pelvis showing moderate to severe right perinephric edema and stranding with in the right pelvis and proximal ureter and a right subscapular fluid collection.  He was evaluated again with persistent right flank pain  on 3/22 in the ED.  He was noted to have improving leukocytosis at that time and was asked to continue previous course of antibiotics and urine culture from that ED visit was negative.    Patient was started on piperacillin tazobactam this admission.  He was seen by general surgery who took him to the OR for incision and drainage of a perirectal abscess on 05/16/2020.  Patient also  seen by interventional radiology and underwent aspiration/drain placement on 4/12 into the right renal subscapular fluid collection which yielded 135 mL of thick purulent fluid.  Over the last 24 hours patient has been afebrile with a T-max of 98.7.  WBC has slightly increased to 13.6.  Creatinine is relatively stable and hemoglobin remains stable today at 10.7.  Drain output from his right subscapular fluid collection measured as 1015 mL over the last 24 hours.  Cultures from his perirectal abscess are growing ESBL E. coli.  Cultures from his IR drain procedure are also with gram-negative rods on Gram stain and culture currently reintubated for better growth.   Past Medical History:  Diagnosis Date  . Aortic valve stenosis 04/05/2015   Formatting of this note might be different from the original. Overview:  ECHO 5/15 Formatting of this note might be different from the original. Overview:  Overview:  ECHO 5/15  . Arthritis    "knees, right shoulder" (03/14/2017)  . Atrial fibrillation, persistent ([place]) 09/19/2014  . CAD (coronary artery disease), native coronary artery    Cath 2011 [facility] (08/2009):~ Proximal LAD 30%, mid to distal LAD 25%, ostial small D1 75% mid AV groove circumflex 99% been subtotal stenosis, proximal to mid RCA 25-30%, mid RCA 30%, mid PDA 30%, EF 50% with inferior hypokinesis.;    July 2011  PCI and DES to circumflex Dr. Olevia Perches   ETT-Myoview (06/2013):  Inferolateral scar, mild peri-infarct ischemia, EF 40%; Intermediate Risk   ECHO EF 55% 03/2013   . CAD (coronary artery disease), native coronary artery 04/05/2015   Cath 2011 [facility] (08/2009):~ Proximal LAD 30%, mid to distal LAD 25%, ostial small D1 75% mid AV groove circumflex 99% been subtotal stenosis, proximal to mid RCA 25-30%, mid RCA 30%, mid PDA 30%, EF 50% with inferior hypokinesis.;    July 2011  PCI and DES to circumflex Dr. Olevia Perches   ETT-Myoview (06/2013):  Inferolateral scar, mild peri-infarct ischemia, EF 40%; Intermediate Risk    ECHO EF 55% 03/2013    . Cardiac pacemaker in situ 03/14/2017   Biventricular [employer] inserted 03/14/17 Dr. Lovena Le for second degree heart block   . Chronic combined systolic and diastolic heart failure ([place]) 11/19/2017  . CKD (chronic kidney disease), stage III ([place])   . Gastro-esophageal reflux disease without esophagitis 09/24/2009  . GERD   . History of infection of prosthetic knee 04/05/2015   Treated with debridement and irrigation followed by 6 months of triple antibiotics  . History of kidney stones   . History of peptic ulcer 1970s?  [facility] Kitchen History of prostate cancer    Radical prostatectomy in 2006 Dr. Terance Hart   . Hypertensive heart disease   . Internal hemorrhoids 09/11/2018  . Long term (current) use of anticoagulants 10/06/2011  . Mixed hyperlipidemia   . Moderate persistent asthma without complication 8/54/6270  . Noise effect on both inner ears 08/14/2018  . Obesity (BMI 30-39.9)   . OSA on CPAP   . OSA treated with BiPAP 11/15/2015  . Persistent atrial fibrillation ([place]) 09/19/2014   CHA2DS2VASC score 5  Cardioversion 10/08/2014 was on amiodarone until 2018   . 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)    Initial DVT in 2006 after prostate surgery and had [place] filter placed Bilateral  PE 2013 and placed back on warfarin DVT of right subclavian vein on doppler 10/2012 at time of knee infection   . Personal history of malignant neoplasm of prostate    Radical prostatectomy in 2006 Dr. Terance Hart    . Presbycusis of both ears 08/14/2018  . Severe aortic stenosis   . Severe aortic stenosis 03/18/2018  . Urinary incontinence    MULTIPLE BLADDER SURGERIES - STATES NO URINARY SPHINCTER - PT'S UROLOGIST IS AT [facility]- DR. PETERSON  ( LAST VISIT WAS 09/15/11 )    Social History   Tobacco Use  . Smoking status: Former Smoker    Packs/day: 1.00    Years: 10.00    Pack years: 10.00    Types: Cigarettes    Quit date: 04/27/1961    Years since quitting: 59.1  .  Smokeless tobacco: Never Used  Vaping Use  . Vaping Use: Never used  Substance Use Topics  . Alcohol use: No    Alcohol/week: 0.0 standard drinks  . Drug use: No    Family History  Problem Relation Age of Onset  . Melanoma Father   . Melanoma Brother     Allergies  Allergen Reactions  . Darifenacin Nausea Only and Other (See Comments)    dry mouth and dizziness ENABLEX Dizziness Pt denies  . Codeine Other (See Comments)  . Sulfamethoxazole-Trimethoprim Other (See Comments)  . Lisinopril Cough    Pt denies    Review of Systems  Constitutional: Positive for malaise/fatigue. Negative for chills and fever.       Reports fatigue from frequent lab draws  HENT: Negative.   Eyes: Negative.   Respiratory: Negative.   Cardiovascular: Negative.   Gastrointestinal:       Reports pain from perirectal I&D  Genitourinary: Negative for flank pain.  Musculoskeletal: Negative.     OBJECTIVE:   Blood pressure 135/68, pulse 63, temperature 98.9 F (37.2 C), temperature source Oral, resp. rate 20, height 6' (1.829 m), weight 97.1 kg, SpO2 98 %. Body mass index is 29.02 kg/m.  Physical Exam Constitutional:      General: He is not in acute distress.    Appearance: Normal appearance.  HENT:     Head: Normocephalic and atraumatic.     Nose: Nose normal.     Mouth/Throat:     Mouth: Mucous membranes are moist.     Pharynx: Oropharynx is clear.  Eyes:     Extraocular Movements: Extraocular movements intact.     Conjunctiva/sclera: Conjunctivae normal.  Cardiovascular:     Rate and Rhythm: Normal rate.     Pulses: Normal pulses.  Pulmonary:     Effort: Pulmonary effort is normal. No respiratory distress.     Breath sounds: Normal breath sounds.  Abdominal:     General: There is no distension.     Palpations: Abdomen is soft.     Tenderness: There is no abdominal tenderness.  Genitourinary:    Comments: Right sided IR drain in place  Skin:    General: Skin is warm and  dry.     Findings: No rash.  Neurological:     General: No focal deficit present.     Mental Status: He is alert.  Psychiatric:        Mood and Affect:  Mood normal.        Behavior: Behavior normal.      Lab Results: Lab Results  Component Value Date   WBC 13.6 (H) 05/19/2020   HGB 10.7 (L) 05/19/2020   HCT 34.5 (L) 05/19/2020   MCV 81.4 05/19/2020   PLT 326 05/19/2020    Lab Results  Component Value Date   NA 137 05/19/2020   K 4.8 05/19/2020   CO2 26 05/19/2020   GLUCOSE 135 (H) 05/19/2020   BUN 20 05/19/2020   CREATININE 1.40 (H) 05/19/2020   CALCIUM 9.3 05/19/2020   GFRNONAA 49 (L) 05/19/2020   GFRAA 51 (L) 09/08/2019    Lab Results  Component Value Date   ALT 8 05/19/2020   AST 16 05/19/2020   ALKPHOS 68 05/19/2020   BILITOT 1.1 05/19/2020       Component Value Date/Time   CRP <0.5 04/22/2013 1106       Component Value Date/Time   ESRSEDRATE 4 04/22/2013 1106    I have reviewed the micro and lab results in Epic.  Imaging: CT IMAGE GUIDED DRAINAGE BY PERCUTANEOUS CATHETER  Result Date: 05/18/2020 INDICATION: Enlarging subcapsular perinephric fluid collection concerning for abscess. EXAM: CT-guided drain placement MEDICATIONS: The patient is currently admitted to the hospital and receiving intravenous antibiotics. The antibiotics were administered within an appropriate time frame prior to the initiation of the procedure. ANESTHESIA/SEDATION: Fentanyl 50 mcg IV; Versed 1 mg IV Moderate Sedation Time:  12 minutes The patient was continuously monitored during the procedure by the interventional radiology nurse under my direct supervision. COMPLICATIONS: None immediate. PROCEDURE: Informed written consent was obtained from the patient after a thorough discussion of the procedural risks, benefits and alternatives. All questions were addressed. Maximal Sterile Barrier Technique was utilized including caps, mask, sterile gowns, sterile gloves, sterile drape, hand  hygiene and skin antiseptic. A timeout was performed prior to the initiation of the procedure. Planning CT scan of the abdomen was performed. Large subcapsular fluid collection identified in the anterolateral aspect of the right kidney. The patient was repositioned into the left lateral decubitus position. A suitable skin entry site was selected and marked. The overlying skin was sterilely prepped and draped in the standard fashion with chlorhexidine skin prep. Local anesthesia was attained by infiltration with 1% lidocaine. A small dermatotomy was made. Under intermittent CT guidance, an 18 gauge trocar needle was advanced into the fluid collection. A 0.035 wire was then coiled in the collection. The needle was removed. The skin tract was dilated to 12 [place]. A Cook 12 [place] all-purpose drainage catheter was advanced over the wire and formed. Aspiration yields 135 mL thick, purulent fluid. The catheter was gently flushed with saline and connected to JP bulb suction before being secured to the skin with 0 Prolene suture. Follow-up CT imaging demonstrates a well-positioned drainage catheter and significant decompression of the subcapsular perinephric fluid collection. IMPRESSION: Successful placement of 12 French percutaneous drainage catheter with aspiration of 135 mL frankly purulent fluid from the subcapsular perinephric fluid collection. Electronically Signed   By: Jacqulynn Cadet M.D.   On: 05/18/2020 12:46     Imaging independently reviewed in Epic.  [facility] [phone] pager 05/19/2020, 4:31 PM

## 2020-05-19 NOTE — Progress Notes (Signed)
PROGRESS NOTE    [name]  KWI:[id number] DOB: Nov 06, 1931 DOA: 05/15/2020 PCP: Shirline Frees, MD    Chief Complaint  Patient presents with  . 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 Also found to have 6g drop in hgb with heme pos stools on presentation. Patient also noted to have a renal abscess status post drain placement with cultures pending.   Assessment & Plan:   Principal Problem:   Perirectal abscess Active Problems:   Hypertensive heart disease   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)   Gastro-esophageal reflux disease without esophagitis   Long term (current) use of anticoagulants   Persistent atrial fibrillation (HCC)   CKD (chronic kidney disease), stage III (HCC)   S/P TAVR (transcatheter aortic valve replacement)   OSA on CPAP   CAD (coronary artery disease), native coronary artery   Atrial fibrillation, persistent (HCC)   Pyelonephritis of right kidney   Presence of IVC filter   Constipation, chronic   Cysts of right kidney   Renal abscess  1 perirectal abscess -Noted on CT abdomen and pelvis. -General surgery consulted and patient underwent incision and drainage per general surgery. -Cultures from incision and drainage with abundant E. coli sensitive to Zosyn, Cipro, gentamicin, imipenem, Bactrim and resistant to Unasyn, ampicillin, cefazolin, ceftazidime, Rocephin with intermediate sensitivity to cefepime. -Continue IV antibiotics. -Per general surgery. -Due to concurrent renal abscess will consult with ID for antibiotic recommendations and duration.  2.  Right renal subcapsular  fluid collection/renal abscess/?  Persistent pyelonephritis -Patient noted to have a right renal subcapsular fluid collection/renal abscess, -Patient noted to have had a UTI approximately a month ago placed on Bactrim later reevaluated in the ED placed on cefpodoxime.  Urine cultures with no growth. -Cultures from this admission negative. -Unable to perform MRI secondary to pacemaker. -Dr Wyline Copas discussed with urology who recommended drainage per IR. -Status post drain placement per IR with purulent fluid noted and sent for cultures. -.  Cultures done with abundant gram-negative rods. -Continue empiric IV antibiotics. -Due to problem #1 and 2 ID consulted for antibiotic recommendations and duration.  3.  Anemia/melena -Concern for acute GI bleed in the setting of Coumadin use. -Patient noted to be followed closely at [facility] for [facility] for history of hemorrhoids in the past. -Hemoglobin noted to have gone down to 9.7 from 16.2 (04/27/2020). -Hemoglobin seems to be stabilizing and currently at 10.7. -Stools noted to be heme positive. -Dr. Wyline Copas discussed with GI on-call, and given recent surgical drainage of perirectal abscess recommendation is to hold off on inpatient endoscopy for now and follow-up in the outpatient setting when more stable. -Coumadin on hold.  4.  History of persistent atrial fibrillation - CHA2DSVASC score 5 -Coumadin currently on hold secondary to concerns for anemia/melena.  5.  History of recurrent DVT/PE with supratherapeutic INR -Patient noted to have a history of provoked DVT following prostatectomy status post IVC placement afterwards. -Patient noted to have developed bacteremia and PE and DVT and subsequently placed on warfarin. -Patient's PE and DVT noted to be remote. -Coumadin held due to concern for dark stools overnight.  Hemoglobin stable. -Repeat H&H and if remains stable in the next 1 to  2 days may consider resumption of home regimen Coumadin.  6.  History of  heart block status post PPM -Stable. -Outpatient follow-up.  7.  History of aortic stenosis status post TAVR -Appears to be a bioprosthetic valve. -Outpatient follow-up.  8.  Coronary artery disease status post stent -Stable.  9.  OSA CPAP nightly.  10.  Chronic kidney disease stage IIIa - Stable.    DVT prophylaxis: SCDs Code Status: Full Family Communication: Updated patient, wife, daughter at bedside. Disposition:   Status is: Inpatient    Dispo: The patient is from: Home              Anticipated d/c is to: Likely home              Patient currently with a perirectal abscess status post incision and drainage with cultures pending, renal abscess status post drain placement per IR, cultures pending, on IV antibiotics, ID consultation pending.  Not stable for discharge.   Difficult to place patient no       Consultants:   General surgery: Dr. Donne Hazel 05/16/2020  Interventional radiology: Dr. Laurence Ferrari 05/17/2020  Infectious disease:  pending 05/19/2020  Procedures:   CT abdomen and pelvis 05/15/2020  Placement of 12 French percutaneous drainage catheter with aspiration of 135 mL of frankly pleural fluid from subscapular perinephric fluid collection-Per IR, Dr. Laurence Ferrari 05/18/2020  Incision and drainage of perirectal abscess per general surgery, Dr. Donne Hazel 05/16/2020  Antimicrobials:   IV Merrem 05/19/2020>>>>  IV Zosyn 05/15/2020 >>>> 05/19/2020    Subjective: Patient sitting up in bed wife and daughter at bedside.  Denies any chest pain.  Denies any shortness of breath.  No abdominal pain.  Denies any significant rectal pain today.  Objective: Vitals:   05/18/20 1221 05/18/20 1357 05/19/20 0504 05/19/20 1427  BP: 133/64 130/73 (!) 150/69 135/68  Pulse: 60 62 60 63  Resp: 20 20 20 20   Temp: 98.5 F (36.9 C) 97.6 F (36.4 C) 97.8 F (36.6 C) 98.9 F (37.2 C)  TempSrc: Oral Oral Oral Oral  SpO2: 96% 96% 93% 98%  Weight:      Height:         Intake/Output Summary (Last 24 hours) at 05/19/2020 1612 Last data filed at 05/19/2020 0500 Gross per 24 hour  Intake 10 ml  Output 880 ml  Net -870 ml   Filed Weights   05/15/20 1923  Weight: 97.1 kg    Examination:  General exam: Appears calm and comfortable  Respiratory system: Clear to auscultation. Respiratory effort normal. Cardiovascular system: S1 & S2 heard, RRR. No JVD, murmurs, rubs, gallops or clicks. No pedal edema. Gastrointestinal system: Abdomen is nondistended, soft and nontender. No organomegaly or masses felt.  JP tube noted with purulent serosanguineous fluid.  Normal bowel sounds heard. Central nervous system: Alert and oriented. No focal neurological deficits. Extremities: Symmetric 5 x 5 power. Skin: No rashes, lesions or ulcers Psychiatry: Judgement and insight appear normal. Mood & affect appropriate.     Data Reviewed: I have personally reviewed following labs and imaging studies  CBC: Recent Labs  Lab 05/15/20 2000 05/16/20 0611 05/17/20 0659 05/17/20 1819 05/18/20 0449 05/19/20 0515  WBC 11.8* 12.6* 10.6*  --  10.5 13.6*  NEUTROABS 8.9*  --   --   --   --   --   HGB 10.4* 10.4* 9.7* 10.2* 9.8* 10.7*  HCT 32.8* 32.7* 30.9* 33.4* 31.0* 34.5*  MCV 80.4 81.5 79.8*  --  80.7 81.4  PLT  283 285 294  --  306 751    Basic Metabolic Panel: Recent Labs  Lab 05/15/20 2000 05/16/20 0611 05/17/20 0659 05/18/20 0449 05/19/20 0515  NA 138 136 135 136 137  K 4.2 4.7 4.7 4.5 4.8  CL 104 103 105 104 104  CO2 26 26 24 24 26   GLUCOSE 108* 99 125* 124* 135*  BUN 27* 24* 23 22 20   CREATININE 1.53* 1.29* 1.30* 1.22 1.40*  CALCIUM 9.3 8.9 9.3 9.4 9.3  PHOS  --  3.4  --   --   --     GFR: Estimated Creatinine Clearance: 44.9 mL/min (A) (by C-G formula based on SCr of 1.4 mg/dL (H)).  Liver Function Tests: Recent Labs  Lab 05/17/20 0659 05/18/20 0449 05/19/20 0515  AST 16 20 16   ALT 9 9 8   ALKPHOS 71 69 68  BILITOT 0.9 0.8 1.1  PROT  5.8* 5.8* 6.0*  ALBUMIN 2.3* 2.2* 2.4*    CBG: No results for input(s): GLUCAP in the last 168 hours.   Recent Results (from the past 240 hour(s))  Resp Panel by RT-PCR (Flu A&B, Covid) Nasopharyngeal Swab     Status: None   Collection Time: 05/16/20  8:06 AM   Specimen: Nasopharyngeal Swab; Nasopharyngeal(NP) swabs in vial transport medium  Result Value Ref Range Status   SARS Coronavirus 2 by RT PCR NEGATIVE NEGATIVE Final    Comment: (NOTE) SARS-CoV-2 target nucleic acids are NOT DETECTED.  The SARS-CoV-2 RNA is generally detectable in upper respiratory specimens during the acute phase of infection. The lowest concentration of SARS-CoV-2 viral copies this assay can detect is 138 copies/mL. A negative result does not preclude SARS-Cov-2 infection and should not be used as the sole basis for treatment or other patient management decisions. A negative result may occur with  improper specimen collection/handling, submission of specimen other than nasopharyngeal swab, presence of viral mutation(s) within the areas targeted by this assay, and inadequate number of viral copies(<138 copies/mL). A negative result must be combined with clinical observations, patient history, and epidemiological information. The expected result is Negative.  Fact Sheet for Patients:  EntrepreneurPulse.com.au  Fact Sheet for Healthcare Providers:  IncredibleEmployment.be  This test is no t yet approved or cleared by the [place] FDA and  has been authorized for detection and/or diagnosis of SARS-CoV-2 by FDA under an Emergency Use Authorization (EUA). This EUA will remain  in effect (meaning this test can be used) for the duration of the COVID-19 declaration under Section 564(b)(1) of the Act, 21 U.S.C.section 360bbb-3(b)(1), unless the authorization is terminated  or revoked sooner.       Influenza A by PCR NEGATIVE NEGATIVE Final   Influenza B by PCR  NEGATIVE NEGATIVE Final    Comment: (NOTE) The Xpert Xpress SARS-CoV-2/FLU/RSV plus assay is intended as an aid in the diagnosis of influenza from Nasopharyngeal swab specimens and should not be used as a sole basis for treatment. Nasal washings and aspirates are unacceptable for Xpert Xpress SARS-CoV-2/FLU/RSV testing.  Fact Sheet for Patients: EntrepreneurPulse.com.au  Fact Sheet for Healthcare Providers: IncredibleEmployment.be  This test is not yet approved or cleared by the [place] FDA and has been authorized for detection and/or diagnosis of SARS-CoV-2 by FDA under an Emergency Use Authorization (EUA). This EUA will remain in effect (meaning this test can be used) for the duration of the COVID-19 declaration under Section 564(b)(1) of the Act, 21 U.S.C. section 360bbb-3(b)(1), unless the authorization is terminated or revoked.  Performed  at [facility], [place] [address] [place], [place] [address]   Aerobic/Anaerobic Culture w Gram Stain (surgical/deep wound)     Status: None (Preliminary result)   Collection Time: 05/16/20 11:22 AM   Specimen: PATH Other; Tissue  Result Value Ref Range Status   Specimen Description   Final    TISSUE Performed at [facility] [address] [place], [place] [address]    Special Requests   Final    NONE Performed at [facility], [place] [address], [place], [place] [address]    Gram Stain   Final    MODERATE WBC PRESENT, PREDOMINANTLY PMN FEW GRAM POSITIVE COCCI IN PAIRS MODERATE GRAM NEGATIVE RODS Performed at [facility], [place] [address], [place], [place] [address]    Culture   Final    ABUNDANT ESCHERICHIA COLI Confirmed Extended Spectrum Beta-Lactamase Producer (ESBL).  In bloodstream infections from ESBL organisms, carbapenems are preferred over piperacillin/tazobactam. They are shown to have a lower risk of mortality. NO  ANAEROBES ISOLATED; CULTURE IN PROGRESS FOR 5 DAYS    Report Status PENDING  Incomplete   Organism ID, Bacteria ESCHERICHIA COLI  Final      Susceptibility   Escherichia coli - MIC*    AMPICILLIN >=32 RESISTANT Resistant     CEFAZOLIN >=64 RESISTANT Resistant     CEFEPIME 8 INTERMEDIATE Intermediate     CEFTAZIDIME RESISTANT Resistant     CEFTRIAXONE >=64 RESISTANT Resistant     CIPROFLOXACIN <=0.25 SENSITIVE Sensitive     GENTAMICIN <=1 SENSITIVE Sensitive     IMIPENEM <=0.25 SENSITIVE Sensitive     TRIMETH/SULFA <=20 SENSITIVE Sensitive     AMPICILLIN/SULBACTAM >=32 RESISTANT Resistant     PIP/TAZO 8 SENSITIVE Sensitive     * ABUNDANT ESCHERICHIA COLI  Aerobic/Anaerobic Culture (surgical/deep wound)     Status: None (Preliminary result)   Collection Time: 05/18/20 12:01 PM   Specimen: Abscess  Result Value Ref Range Status   Specimen Description   Final    ABSCESS RT KIDNEY Performed at [facility] [address], [place], [place] [address]    Special Requests   Final    Normal Performed at [facility], [place] [address], [place], [place] [address]    Gram Stain ABUNDANT FEW GRAM NEGATIVE RODS   Final   Culture   Final    CULTURE REINCUBATED FOR BETTER GROWTH Performed at [facility], [place] [address], [place], [place] [address]    Report Status PENDING  Incomplete         Radiology Studies: CT IMAGE GUIDED DRAINAGE BY PERCUTANEOUS CATHETER  Result Date: 05/18/2020 INDICATION: Enlarging subcapsular perinephric fluid collection concerning for abscess. EXAM: CT-guided drain placement MEDICATIONS: The patient is currently admitted to the hospital and receiving intravenous antibiotics. The antibiotics were administered within an appropriate time frame prior to the initiation of the procedure. ANESTHESIA/SEDATION: Fentanyl 50 mcg IV; Versed 1 mg IV Moderate Sedation Time:  12 minutes The patient was continuously monitored during  the procedure by the interventional radiology nurse under my direct supervision. COMPLICATIONS: None immediate. PROCEDURE: Informed written consent was obtained from the patient after a thorough discussion of the procedural risks, benefits and alternatives. All questions were addressed. Maximal Sterile Barrier Technique was utilized including caps, mask, sterile gowns, sterile gloves, sterile drape, hand hygiene and skin antiseptic. A timeout was performed prior to the initiation of the procedure. Planning CT scan of the abdomen was  performed. Large subcapsular fluid collection identified in the anterolateral aspect of the right kidney. The patient was repositioned into the left lateral decubitus position. A suitable skin entry site was selected and marked. The overlying skin was sterilely prepped and draped in the standard fashion with chlorhexidine skin prep. Local anesthesia was attained by infiltration with 1% lidocaine. A small dermatotomy was made. Under intermittent CT guidance, an 18 gauge trocar needle was advanced into the fluid collection. A 0.035 wire was then coiled in the collection. The needle was removed. The skin tract was dilated to 12 [place]. A Cook 12 [place] all-purpose drainage catheter was advanced over the wire and formed. Aspiration yields 135 mL thick, purulent fluid. The catheter was gently flushed with saline and connected to JP bulb suction before being secured to the skin with 0 Prolene suture. Follow-up CT imaging demonstrates a well-positioned drainage catheter and significant decompression of the subcapsular perinephric fluid collection. IMPRESSION: Successful placement of 12 French percutaneous drainage catheter with aspiration of 135 mL frankly purulent fluid from the subcapsular perinephric fluid collection. Electronically Signed   By: Jacqulynn Cadet M.D.   On: 05/18/2020 12:46        Scheduled Meds: . amLODipine  2.5 mg Oral Daily  . feeding supplement  237 mL Oral BID  BM  . furosemide  20 mg Oral Daily  . isosorbide mononitrate  15 mg Oral Daily  . polyethylene glycol  34 g Oral BID  . pravastatin  20 mg Oral Daily  . sodium chloride flush  5 mL Intracatheter Q8H  . vitamin B-12  500 mcg Oral Daily   Continuous Infusions: . sodium chloride 10 mL/hr at 05/16/20 0401  . meropenem (MERREM) IV       LOS: 3 days    Time spent: 40 minutes    Irine Seal, MD [facility]   To contact the attending provider between 7A-7P or the covering provider during after hours 7P-7A, please log into the web site [email or web address] and access using universal [place] password for that web site. If you do not have the password, please call the hospital operator.  05/19/2020, 4:12 PM

## 2020-05-20 DIAGNOSIS — K611 Rectal abscess: Secondary | ICD-10-CM | POA: Diagnosis not present

## 2020-05-20 DIAGNOSIS — I25118 Atherosclerotic heart disease of native coronary artery with other forms of angina pectoris: Secondary | ICD-10-CM | POA: Diagnosis not present

## 2020-05-20 DIAGNOSIS — N151 Renal and perinephric abscess: Secondary | ICD-10-CM | POA: Diagnosis not present

## 2020-05-20 DIAGNOSIS — N12 Tubulo-interstitial nephritis, not specified as acute or chronic: Secondary | ICD-10-CM | POA: Diagnosis not present

## 2020-05-20 DIAGNOSIS — I4819 Other persistent atrial fibrillation: Secondary | ICD-10-CM | POA: Diagnosis not present

## 2020-05-20 DIAGNOSIS — N1831 Chronic kidney disease, stage 3a: Secondary | ICD-10-CM | POA: Diagnosis not present

## 2020-05-20 LAB — BASIC METABOLIC PANEL
Anion gap: 7 (ref 5–15)
BUN: 20 mg/dL (ref 8–23)
CO2: 27 mmol/L (ref 22–32)
Calcium: 9.4 mg/dL (ref 8.9–10.3)
Chloride: 102 mmol/L (ref 98–111)
Creatinine, Ser: 1.38 mg/dL — ABNORMAL HIGH (ref 0.61–1.24)
GFR, Estimated: 49 mL/min — ABNORMAL LOW (ref >60)
Glucose, Bld: 130 mg/dL — ABNORMAL HIGH (ref 70–99)
Potassium: 3.9 mmol/L (ref 3.5–5.1)
Sodium: 136 mmol/L (ref 135–145)

## 2020-05-20 LAB — PROTIME-INR
INR: 1.5 — ABNORMAL HIGH (ref 0.8–1.2)
Prothrombin Time: 18 seconds — ABNORMAL HIGH (ref 11.4–15.2)

## 2020-05-20 LAB — CBC
HCT: 33.5 % — ABNORMAL LOW (ref 39.0–52.0)
Hemoglobin: 10.6 g/dL — ABNORMAL LOW (ref 13.0–17.0)
MCH: 25.4 pg — ABNORMAL LOW (ref 26.0–34.0)
MCHC: 31.6 g/dL (ref 30.0–36.0)
MCV: 80.1 fL (ref 80.0–100.0)
Platelets: 361 10*3/uL (ref 150–400)
RBC: 4.18 MIL/uL — ABNORMAL LOW (ref 4.22–5.81)
RDW: 18.7 % — ABNORMAL HIGH (ref 11.5–15.5)
WBC: 11 10*3/uL — ABNORMAL HIGH (ref 4.0–10.5)
nRBC: 0 % (ref 0.0–0.2)

## 2020-05-20 LAB — MAGNESIUM: Magnesium: 1.9 mg/dL (ref 1.7–2.4)

## 2020-05-20 MED ORDER — WARFARIN SODIUM 2.5 MG PO TABS
2.5000 mg | ORAL_TABLET | Freq: Once | ORAL | Status: AC
  Administered 2020-05-20: 2.5 mg via ORAL
  Filled 2020-05-20: qty 1

## 2020-05-20 MED ORDER — WARFARIN - PHARMACIST DOSING INPATIENT: Freq: Every day | Status: DC

## 2020-05-20 MED ORDER — MAGNESIUM SULFATE 2 GM/50ML IV SOLN
2.0000 g | Freq: Once | INTRAVENOUS | Status: AC
  Administered 2020-05-20: 2 g via INTRAVENOUS
  Filled 2020-05-20: qty 50

## 2020-05-20 MED ORDER — POTASSIUM CHLORIDE CRYS ER 20 MEQ PO TBCR
20.0000 meq | EXTENDED_RELEASE_TABLET | Freq: Once | ORAL | Status: AC
  Administered 2020-05-20: 20 meq via ORAL
  Filled 2020-05-20: qty 1

## 2020-05-20 NOTE — Progress Notes (Signed)
[facility] for Infectious Disease  Date of Admission:  05/15/2020           Reason for visit: Follow up on perirectal and renal abscess  Current antibiotics: Meropenem (4/13--present)  Previous antibiotics: Piperacillin tazobactam (4/9--4/13)   ASSESSMENT:    1. Perirectal abscess secondary to ESBL E. coli status post I&D 4/10 2. Pyelonephritis, right renal subscapular abscess status post IR drain placement 4/12 3. ESBL infection 4. Nonobstructing nephrolithiasis 5. CKD  PLAN:    . Continue meropenem dosed for current renal function given ESBL infection . Follow-up cultures from right renal subscapular abscess. . Contact precautions . Monitor drain output.  Appreciate IR evaluation . Will follow   Principal Problem:   Perirectal abscess Active Problems:   Hypertensive heart disease   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)   Gastro-esophageal reflux disease without esophagitis   Long term (current) use of anticoagulants   Persistent atrial fibrillation (HCC)   CKD (chronic kidney disease), stage III (HCC)   S/P TAVR (transcatheter aortic valve replacement)   OSA on CPAP   CAD (coronary artery disease), native coronary artery   Atrial fibrillation, persistent (HCC)   Pyelonephritis of right kidney   Presence of IVC filter   Constipation, chronic   Cysts of right kidney   Renal abscess    MEDICATIONS:    Scheduled Meds: . amLODipine  2.5 mg Oral Daily  . feeding supplement  237 mL Oral BID BM  . furosemide  20 mg Oral Daily  . isosorbide mononitrate  15 mg Oral Daily  . polyethylene glycol  34 g Oral BID  . pravastatin  20 mg Oral Daily  . sodium chloride flush  5 mL Intracatheter Q8H  . vitamin B-12  500 mcg Oral Daily   Continuous Infusions: . sodium chloride 10 mL/hr at 05/16/20 0401  . meropenem (MERREM) IV 1 g (05/20/20 0909)   PRN Meds:.acetaminophen, haloperidol lactate,  HYDROcodone-acetaminophen  SUBJECTIVE:   24 hour events:  No acute events noted overnight Afebrile Improving leukocytosis Stable renal function  Patient seen this morning.  His wife is present.  He is working with physical therapy.  He reports improved pain, denies fevers or chills.  Reports feeling less tired after being on allowed to rest more yesterday.     OBJECTIVE:   Blood pressure (!) 146/61, pulse 61, temperature 98.1 F (36.7 C), temperature source Oral, resp. rate (!) 21, height 6' (1.829 m), weight 97.1 kg, SpO2 95 %. Body mass index is 29.02 kg/m.  Physical Exam Constitutional:      General: He is not in acute distress.    Appearance: Normal appearance.  HENT:     Head: Normocephalic and atraumatic.  Pulmonary:     Effort: Pulmonary effort is normal. No respiratory distress.  Genitourinary:    Comments: Right-sided IR drain in place Neurological:     General: No focal deficit present.     Mental Status: He is alert and oriented to person, place, and time.  Psychiatric:        Mood and Affect: Mood normal.        Behavior: Behavior normal.      Lab Results: Lab Results  Component Value Date   WBC 11.0 (H) 05/20/2020   HGB 10.6 (L) 05/20/2020   HCT 33.5 (L) 05/20/2020   MCV 80.1 05/20/2020   PLT 361 05/20/2020    Lab Results  Component Value Date  NA 136 05/20/2020   K 3.9 05/20/2020   CO2 27 05/20/2020   GLUCOSE 130 (H) 05/20/2020   BUN 20 05/20/2020   CREATININE 1.38 (H) 05/20/2020   CALCIUM 9.4 05/20/2020   GFRNONAA 49 (L) 05/20/2020   GFRAA 51 (L) 09/08/2019    Lab Results  Component Value Date   ALT 8 05/19/2020   AST 16 05/19/2020   ALKPHOS 68 05/19/2020   BILITOT 1.1 05/19/2020       Component Value Date/Time   CRP <0.5 04/22/2013 1106       Component Value Date/Time   ESRSEDRATE 4 04/22/2013 1106     I have reviewed the micro and lab results in Epic.  Imaging: CT IMAGE GUIDED DRAINAGE BY PERCUTANEOUS  CATHETER  Result Date: 05/18/2020 INDICATION: Enlarging subcapsular perinephric fluid collection concerning for abscess. EXAM: CT-guided drain placement MEDICATIONS: The patient is currently admitted to the hospital and receiving intravenous antibiotics. The antibiotics were administered within an appropriate time frame prior to the initiation of the procedure. ANESTHESIA/SEDATION: Fentanyl 50 mcg IV; Versed 1 mg IV Moderate Sedation Time:  12 minutes The patient was continuously monitored during the procedure by the interventional radiology nurse under my direct supervision. COMPLICATIONS: None immediate. PROCEDURE: Informed written consent was obtained from the patient after a thorough discussion of the procedural risks, benefits and alternatives. All questions were addressed. Maximal Sterile Barrier Technique was utilized including caps, mask, sterile gowns, sterile gloves, sterile drape, hand hygiene and skin antiseptic. A timeout was performed prior to the initiation of the procedure. Planning CT scan of the abdomen was performed. Large subcapsular fluid collection identified in the anterolateral aspect of the right kidney. The patient was repositioned into the left lateral decubitus position. A suitable skin entry site was selected and marked. The overlying skin was sterilely prepped and draped in the standard fashion with chlorhexidine skin prep. Local anesthesia was attained by infiltration with 1% lidocaine. A small dermatotomy was made. Under intermittent CT guidance, an 18 gauge trocar needle was advanced into the fluid collection. A 0.035 wire was then coiled in the collection. The needle was removed. The skin tract was dilated to 12 [place]. A Cook 12 [place] all-purpose drainage catheter was advanced over the wire and formed. Aspiration yields 135 mL thick, purulent fluid. The catheter was gently flushed with saline and connected to JP bulb suction before being secured to the skin with 0 Prolene  suture. Follow-up CT imaging demonstrates a well-positioned drainage catheter and significant decompression of the subcapsular perinephric fluid collection. IMPRESSION: Successful placement of 12 French percutaneous drainage catheter with aspiration of 135 mL frankly purulent fluid from the subcapsular perinephric fluid collection. Electronically Signed   By: Jacqulynn Cadet M.D.   On: 05/18/2020 12:46     Imaging  independently reviewed in Epic.    [facility] for Infectious Disease [facility] [phone] pager 05/20/2020, 11:57 AM

## 2020-05-20 NOTE — Progress Notes (Signed)
PROGRESS NOTE    [name]  UKG:[id number] DOB: Feb 23, 1931 DOA: 05/15/2020 PCP: Shirline Frees, MD    Chief Complaint  Patient presents with  . 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 Also found to have 6g drop in hgb with heme pos stools on presentation. Patient also noted to have a renal abscess status post drain placement with cultures pending.   Assessment & Plan:   Principal Problem:   Perirectal abscess Active Problems:   Hypertensive heart disease   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)   Gastro-esophageal reflux disease without esophagitis   Long term (current) use of anticoagulants   Persistent atrial fibrillation (HCC)   CKD (chronic kidney disease), stage III (HCC)   S/P TAVR (transcatheter aortic valve replacement)   OSA on CPAP   CAD (coronary artery disease), native coronary artery   Atrial fibrillation, persistent (HCC)   Pyelonephritis of right kidney   Presence of IVC filter   Constipation, chronic   Cysts of right kidney   Renal abscess  1 perirectal abscess secondary to ESBL E. coli status post I&D 4/10 -Noted on CT abdomen and pelvis. -General surgery consulted and patient underwent incision and drainage(4/10) per general surgery. -Cultures from incision and drainage with abundant ESBL E. coli sensitive to Zosyn, Cipro, gentamicin, imipenem, Bactrim and resistant to Unasyn, ampicillin, cefazolin, ceftazidime, Rocephin with intermediate sensitivity to cefepime. -Due to concurrent renal abscess ID consulted and IV antibiotics changed to IV Merrem.   -Continue sitz bath's as  recommended per general surgery.   -General surgery was following but have signed off.   -Outpatient follow-up with general surgery.   2.  Right renal subcapsular fluid collection/renal abscess/?  Persistent pyelonephritis -Patient noted to have a right renal subcapsular fluid collection/renal abscess, -Patient noted to have had a UTI approximately a month ago placed on Bactrim later reevaluated in the ED placed on cefpodoxime.  Urine cultures with no growth. -Cultures from this admission negative. -Unable to perform MRI secondary to pacemaker. -Dr Wyline Copas discussed with urology who recommended drainage per IR. -Status post drain placement per IR with purulent fluid noted and sent for cultures. -Cultures pending. -Continue IV Merrem for ESBL per ID recommendations. -ID following and appreciate input and recommendations.  3.  Anemia/melena -Concern for acute GI bleed in the setting of Coumadin use. -Patient noted to be followed closely at [facility] for GI for history of hemorrhoids in the past. -Hemoglobin noted to have gone down to 9.7 from 16.2 (04/27/2020). -Hemoglobin currently stable at 10.6.  -Patient with no overt bleeding. -Stools noted to be heme positive. -Dr. Wyline Copas discussed with GI on-call, and given recent surgical drainage of perirectal abscess recommendation is to hold off on inpatient endoscopy for now and follow-up in the outpatient setting when more stable. -Resume Coumadin and follow.   4.  History of persistent atrial fibrillation - CHA2DSVASC score 5 -Coumadin was held due to concerns for anemia/melena.   -H&H stable.   -Resume Coumadin.  5.  History of recurrent DVT/PE with supratherapeutic INR -Patient noted to have a history of provoked DVT following prostatectomy status post IVC placement afterwards. -Patient noted to have developed bacteremia and PE and DVT and  subsequently placed on warfarin. -Patient's PE and DVT noted to be remote. -Coumadin held due to concern for  dark stools overnight. -Hemoglobin currently stable at 10.6.  -Resume home regimen Coumadin and follow H&H.   6.  History of heart block status post PPM -Stable. -Outpatient follow-up.  7.  History of aortic stenosis status post TAVR -Appears to be a bioprosthetic valve. -Outpatient follow-up.  8.  Coronary artery disease status post stent -Stable.  Continue home regimen Lasix, Imdur, Pravachol, Norvasc.   9.  OSA CPAP nightly.   10.  Chronic kidney disease stage IIIa - Stable.    DVT prophylaxis: SCDs Code Status: Full Family Communication: Updated patient and wife at bedside. Disposition:   Status is: Inpatient    Dispo: The patient is from: Home              Anticipated d/c is to: Likely home              Patient currently with a perirectal abscess status post incision and drainage with cultures of ESBL, renal abscess status post drain placement per IR, cultures pending, on IV antibiotics, ID following.  Not stable for discharge.   Difficult to place patient no       Consultants:   General surgery: Dr. Donne Hazel 05/16/2020  Interventional radiology: Dr. Laurence Ferrari 05/17/2020  Infectious disease: Dr. Juleen China 05/19/2020  Procedures:   CT abdomen and pelvis 05/15/2020  Placement of 12 French percutaneous drainage catheter with aspiration of 135 mL of frankly pleural fluid from subscapular perinephric fluid collection-Per IR, Dr. Laurence Ferrari 05/18/2020  Incision and drainage of perirectal abscess per general surgery, Dr. Donne Hazel 05/16/2020  Antimicrobials:   IV Merrem 05/19/2020>>>>  IV Zosyn 05/15/2020 >>>> 05/19/2020    Subjective: Sitting up in chair sleeping but arousable.  No chest pain.  No shortness of breath.  No abdominal pain.  Rectal pain improving.    Objective: Vitals:   05/18/20 1357 05/19/20 0504 05/19/20 1427 05/19/20 2038  BP: 130/73 (!) 150/69 135/68 (!) 146/61  Pulse: 62 60 63 61  Resp: 20 20 20  (!) 21  Temp: 97.6 F (36.4 C) 97.8 F  (36.6 C) 98.9 F (37.2 C) 98.1 F (36.7 C)  TempSrc: Oral Oral Oral Oral  SpO2: 96% 93% 98% 95%  Weight:      Height:        Intake/Output Summary (Last 24 hours) at 05/20/2020 1242 Last data filed at 05/20/2020 0900 Gross per 24 hour  Intake 115 ml  Output 680 ml  Net -565 ml   Filed Weights   05/15/20 1923  Weight: 97.1 kg    Examination:  General exam: NAD Respiratory system: CTA B.  No wheezes, no crackles, no rhonchi.  Normal respiratory effort.  Cardiovascular system: Regular rate and rhythm no murmurs rubs or gallops.  No JVD.  No lower extremity edema.  Gastrointestinal system: Abdomen is soft, nontender, nondistended, positive bowel sounds.  JP tube with pinkish fluid.  Central nervous system: Alert and oriented. No focal neurological deficits. Extremities: Symmetric 5 x 5 power. Skin: No rashes, lesions or ulcers Psychiatry: Judgement and insight appear normal. Mood & affect appropriate.     Data Reviewed: I have personally reviewed following labs and imaging studies  CBC: Recent Labs  Lab 05/15/20 2000 05/16/20 0611 05/17/20 0659 05/17/20 1819 05/18/20 0449 05/19/20 0515 05/20/20 0517  WBC 11.8* 12.6* 10.6*  --  10.5 13.6* 11.0*  NEUTROABS 8.9*  --   --   --   --   --   --  HGB 10.4* 10.4* 9.7* 10.2* 9.8* 10.7* 10.6*  HCT 32.8* 32.7* 30.9* 33.4* 31.0* 34.5* 33.5*  MCV 80.4 81.5 79.8*  --  80.7 81.4 80.1  PLT 283 285 294  --  306 326 053    Basic Metabolic Panel: Recent Labs  Lab 05/16/20 0611 05/17/20 0659 05/18/20 0449 05/19/20 0515 05/20/20 0517  NA 136 135 136 137 136  K 4.7 4.7 4.5 4.8 3.9  CL 103 105 104 104 102  CO2 26 24 24 26 27   GLUCOSE 99 125* 124* 135* 130*  BUN 24* 23 22 20 20   CREATININE 1.29* 1.30* 1.22 1.40* 1.38*  CALCIUM 8.9 9.3 9.4 9.3 9.4  MG  --   --   --   --  1.9  PHOS 3.4  --   --   --   --     GFR: Estimated Creatinine Clearance: 45.6 mL/min (A) (by C-G formula based on SCr of 1.38 mg/dL (H)).  Liver  Function Tests: Recent Labs  Lab 05/17/20 0659 05/18/20 0449 05/19/20 0515  AST 16 20 16   ALT 9 9 8   ALKPHOS 71 69 68  BILITOT 0.9 0.8 1.1  PROT 5.8* 5.8* 6.0*  ALBUMIN 2.3* 2.2* 2.4*    CBG: No results for input(s): GLUCAP in the last 168 hours.   Recent Results (from the past 240 hour(s))  Resp Panel by RT-PCR (Flu A&B, Covid) Nasopharyngeal Swab     Status: None   Collection Time: 05/16/20  8:06 AM   Specimen: Nasopharyngeal Swab; Nasopharyngeal(NP) swabs in vial transport medium  Result Value Ref Range Status   SARS Coronavirus 2 by RT PCR NEGATIVE NEGATIVE Final    Comment: (NOTE) SARS-CoV-2 target nucleic acids are NOT DETECTED.  The SARS-CoV-2 RNA is generally detectable in upper respiratory specimens during the acute phase of infection. The lowest concentration of SARS-CoV-2 viral copies this assay can detect is 138 copies/mL. A negative result does not preclude SARS-Cov-2 infection and should not be used as the sole basis for treatment or other patient management decisions. A negative result may occur with  improper specimen collection/handling, submission of specimen other than nasopharyngeal swab, presence of viral mutation(s) within the areas targeted by this assay, and inadequate number of viral copies(<138 copies/mL). A negative result must be combined with clinical observations, patient history, and epidemiological information. The expected result is Negative.  Fact Sheet for Patients:  EntrepreneurPulse.com.au  Fact Sheet for Healthcare Providers:  IncredibleEmployment.be  This test is no t yet approved or cleared by the [place] FDA and  has been authorized for detection and/or diagnosis of SARS-CoV-2 by FDA under an Emergency Use Authorization (EUA). This EUA will remain  in effect (meaning this test can be used) for the duration of the COVID-19 declaration under Section 564(b)(1) of the Act, 21 U.S.C.section  360bbb-3(b)(1), unless the authorization is terminated  or revoked sooner.       Influenza A by PCR NEGATIVE NEGATIVE Final   Influenza B by PCR NEGATIVE NEGATIVE Final    Comment: (NOTE) The Xpert Xpress SARS-CoV-2/FLU/RSV plus assay is intended as an aid in the diagnosis of influenza from Nasopharyngeal swab specimens and should not be used as a sole basis for treatment. Nasal washings and aspirates are unacceptable for Xpert Xpress SARS-CoV-2/FLU/RSV testing.  Fact Sheet for Patients: EntrepreneurPulse.com.au  Fact Sheet for Healthcare Providers: IncredibleEmployment.be  This test is not yet approved or cleared by the [place] FDA and has been authorized for detection and/or diagnosis of SARS-CoV-2  by FDA under an Emergency Use Authorization (EUA). This EUA will remain in effect (meaning this test can be used) for the duration of the COVID-19 declaration under Section 564(b)(1) of the Act, 21 U.S.C. section 360bbb-3(b)(1), unless the authorization is terminated or revoked.  Performed at [facility], [place] [address], [place], [place] [address]   Aerobic/Anaerobic Culture w Gram Stain (surgical/deep wound)     Status: None (Preliminary result)   Collection Time: 05/16/20 11:22 AM   Specimen: PATH Other; Tissue  Result Value Ref Range Status   Specimen Description   Final    TISSUE Performed at [place] [address], [place], [place] [address]    Special Requests   Final    NONE Performed at [facility], [place] [address], [place], [place] [address]    Gram Stain   Final    MODERATE WBC PRESENT, PREDOMINANTLY PMN FEW GRAM POSITIVE COCCI IN PAIRS MODERATE GRAM NEGATIVE RODS Performed at [facility], [place] [address] [place], [place] [address]    Culture   Final    ABUNDANT ESCHERICHIA COLI Confirmed Extended Spectrum Beta-Lactamase Producer (ESBL).  In  bloodstream infections from ESBL organisms, carbapenems are preferred over piperacillin/tazobactam. They are shown to have a lower risk of mortality. NO ANAEROBES ISOLATED; CULTURE IN PROGRESS FOR 5 DAYS    Report Status PENDING  Incomplete   Organism ID, Bacteria ESCHERICHIA COLI  Final      Susceptibility   Escherichia coli - MIC*    AMPICILLIN >=32 RESISTANT Resistant     CEFAZOLIN >=64 RESISTANT Resistant     CEFEPIME 8 INTERMEDIATE Intermediate     CEFTAZIDIME RESISTANT Resistant     CEFTRIAXONE >=64 RESISTANT Resistant     CIPROFLOXACIN <=0.25 SENSITIVE Sensitive     GENTAMICIN <=1 SENSITIVE Sensitive     IMIPENEM <=0.25 SENSITIVE Sensitive     TRIMETH/SULFA <=20 SENSITIVE Sensitive     AMPICILLIN/SULBACTAM >=32 RESISTANT Resistant     PIP/TAZO 8 SENSITIVE Sensitive     * ABUNDANT ESCHERICHIA COLI  Aerobic/Anaerobic Culture (surgical/deep wound)     Status: None (Preliminary result)   Collection Time: 05/18/20 12:01 PM   Specimen: Abscess  Result Value Ref Range Status   Specimen Description   Final    ABSCESS RT KIDNEY Performed at [place] [address], [place], [place] [address]    Special Requests   Final    Normal Performed at [facility], [place] [address] [place], [place] [address]    Gram Stain ABUNDANT FEW GRAM NEGATIVE RODS   Final   Culture   Final    ABUNDANT GRAM NEGATIVE RODS IDENTIFICATION AND SUSCEPTIBILITIES TO FOLLOW Performed at [facility], [place] [address], [place], [place] [address]    Report Status PENDING  Incomplete         Radiology Studies: No results found.      Scheduled Meds: . amLODipine  2.5 mg Oral Daily  . feeding supplement  237 mL Oral BID BM  . furosemide  20 mg Oral Daily  . isosorbide mononitrate  15 mg Oral Daily  . polyethylene glycol  34 g Oral BID  . pravastatin  20 mg Oral Daily  . sodium chloride flush  5 mL Intracatheter Q8H  . vitamin B-12  500 mcg Oral  Daily   Continuous Infusions: . sodium chloride 10 mL/hr at 05/16/20 0401  . meropenem (MERREM) IV 1 g (05/20/20  0909)     LOS: 4 days    Time spent: 35 minutes    Irine Seal, MD [facility]   To contact the attending provider between 7A-7P or the covering provider during after hours 7P-7A, please log into the web site [email or web address] and access using universal [place] password for that web site. If you do not have the password, please call the hospital operator.  05/20/2020, 12:42 PM

## 2020-05-20 NOTE — Progress Notes (Signed)
ANTICOAGULATION CONSULT NOTE - Initial Consult  Pharmacy Consult for Warfarin Indication: history of DVT/PE, atrial fibrillation   Allergies  Allergen Reactions  . Darifenacin Nausea Only and Other (See Comments)    dry mouth and dizziness ENABLEX Dizziness Pt denies  . Codeine Other (See Comments)  . Sulfamethoxazole-Trimethoprim Other (See Comments)  . Lisinopril Cough    Pt denies    Patient Measurements: Height: 6' (182.9 cm) Weight: 97.1 kg (214 lb) IBW/kg (Calculated) : 77.6   Vital Signs: Temp: 98.1 F (36.7 C) (04/14 1342) BP: 135/66 (04/14 1342) Pulse Rate: 64 (04/14 1342)  Labs: Recent Labs    05/18/20 0449 05/19/20 0515 05/20/20 0517  HGB 9.8* 10.7* 10.6*  HCT 31.0* 34.5* 33.5*  PLT 306 326 361  LABPROT 15.7* 18.5* 18.0*  INR 1.3* 1.5* 1.5*  CREATININE 1.22 1.40* 1.38*    Estimated Creatinine Clearance: 45.6 mL/min (A) (by C-G formula based on SCr of 1.38 mg/dL (H)).   Medical History: Past Medical History:  Diagnosis Date  . Aortic valve stenosis 04/05/2015   Formatting of this note might be different from the original. Overview:  ECHO 5/15 Formatting of this note might be different from the original. Overview:  Overview:  ECHO 5/15  . Arthritis    "knees, right shoulder" (03/14/2017)  . Atrial fibrillation, persistent ([place]) 09/19/2014  . CAD (coronary artery disease), native coronary artery    Cath 2011 LHC (08/2009):~ Proximal LAD 30%, mid to distal LAD 25%, ostial small D1 75% mid AV groove circumflex 99% been subtotal stenosis, proximal to mid RCA 25-30%, mid RCA 30%, mid PDA 30%, EF 50% with inferior hypokinesis.;    July 2011  PCI and DES to circumflex Dr. Olevia Perches   ETT-Myoview (06/2013):  Inferolateral scar, mild peri-infarct ischemia, EF 40%; Intermediate Risk   ECHO EF 55% 03/2013   . CAD (coronary artery disease), native coronary artery 04/05/2015   Cath 2011 [facility] (08/2009):~ Proximal LAD 30%, mid to distal LAD 25%, ostial small D1 75% mid AV  groove circumflex 99% been subtotal stenosis, proximal to mid RCA 25-30%, mid RCA 30%, mid PDA 30%, EF 50% with inferior hypokinesis.;    July 2011  PCI and DES to circumflex Dr. Olevia Perches   ETT-Myoview (06/2013):  Inferolateral scar, mild peri-infarct ischemia, EF 40%; Intermediate Risk   ECHO EF 55% 03/2013    . Cardiac pacemaker in situ 03/14/2017   Biventricular St. Jude inserted 03/14/17 Dr. Lovena Le for second degree heart block   . Chronic combined systolic and diastolic heart failure ([place]) 11/19/2017  . CKD (chronic kidney disease), stage III ([place])   . Gastro-esophageal reflux disease without esophagitis 09/24/2009  . GERD   . History of infection of prosthetic knee 04/05/2015   Treated with debridement and irrigation followed by 6 months of triple antibiotics  . History of kidney stones   . History of peptic ulcer 1970s?  Mar[place] Kitchen History of prostate cancer    Radical prostatectomy in 2006 Dr. Terance Hart   . Hypertensive heart disease   . Internal hemorrhoids 09/11/2018  . Long term (current) use of anticoagulants 10/06/2011  . Mixed hyperlipidemia   . Moderate persistent asthma without complication 05/15/8117  . Noise effect on both inner ears 08/14/2018  . Obesity (BMI 30-39.9)   . OSA on CPAP   . OSA treated with BiPAP 11/15/2015  . Persistent atrial fibrillation ([place]) 09/19/2014   CHA2DS2VASC score 5  Cardioversion 10/08/2014 was on amiodarone until 2018   . Personal history of DVT and  pulmonary embolism  (deep vein thrombosis)    Initial DVT in 2006 after prostate surgery and had [place] filter placed Bilateral  PE 2013 and placed back on warfarin DVT of right subclavian vein on doppler 10/2012 at time of knee infection   . Personal history of malignant neoplasm of prostate    Radical prostatectomy in 2006 Dr. Terance Hart    . Presbycusis of both ears 08/14/2018  . Severe aortic stenosis   . Severe aortic stenosis 03/18/2018  . Urinary incontinence    MULTIPLE BLADDER SURGERIES - STATES NO URINARY  SPHINCTER - PT'S UROLOGIST IS AT [facility]- DR. PETERSON  ( LAST VISIT WAS 09/15/11 )    Medications:  PTA warfarin regimen: 2.5mg  PO daily-last dose reported 05/14/2020 at 2100  Assessment: 74 y/oM with PMH of prostate CA, aortic stenosis s/p TAVR, atrial fibrillation, history of recurrent  (remote) DVT/PE s/p IVC filter on warfarin who presented with rectal bleeding. On admission, Hgb 10.4 (down from 16.2 on 04/27/20), INR supratherapeutic at 3.3.   Significant events:  4/9 PM: Vitamin K 2.5mg  PO x 1  4/10: INR 3.4 in AM; decision was made for urgent I&D of perirectal abscess, so MD gave Eppie Gibson and Vitamin K 10mg  IV x 1 prior to procedure. Postprocedure INR 1.4 and Warfarin resumed in PM  4/11: warfarin held d/t need for drainage of R subcapsular renal fluid collection  4/14: resuming warfarin  Today, 05/20/2020:  INR remains subtherapeutic but slightly increased from nadir  Hgb low but stable; Plt stable WNL  No further rectal bleeding noted or procedures planned  Eating 75-100% of meals  No major DDIs with warfarin noted  Goal of Therapy:  INR 2-3 Monitor platelets by anticoagulation protocol: Yes   Plan:   Warfarin 2.5mg  PO x 1 this PM (holding off on boosted doses given recent rectal bleed; if stable overnight can consider boosting dose tomorrow (ie 3-5 mg)  Daily PT/INR, CBC  Monitor for s/sx of bleeding  Furqan Gosselin A 05/20/2020,4:30 PM

## 2020-05-20 NOTE — Progress Notes (Signed)
Referring Physician(s): Donne Hazel [facility])  Supervising Physician: Markus Daft  Patient Status:  [facility] - In-pt  Chief Complaint: Drain F/U  Subjective:  Right subcapsular perinephric fluid collection concerning for abscess s/p right renal subscapular drain placement in IR 05/18/2020. Patient awake and alert laying in bed with no complaints. Wife at bedside, shaving his face. Right renal subscapular drain site c/d/i.   Allergies: Darifenacin, Codeine, Sulfamethoxazole-trimethoprim, and Lisinopril  Medications: Prior to Admission medications   Medication Sig Start Date End Date Taking? Authorizing Provider  acetaminophen (TYLENOL) 500 MG tablet Take 500 mg by mouth at bedtime.    Yes [provider]  amLODipine (NORVASC) 2.5 MG tablet Take 1 tablet (2.5 mg total) by mouth daily. 09/03/19  Yes Tobb, Kardie, DO  Ascorbic Acid (VITAMIN C) 1000 MG tablet Take 1,000 mg by mouth daily.   Yes [provider]  Biotin 2500 MCG CAPS Take 2,500 mcg by mouth daily.   Yes [provider]  cholecalciferol (VITAMIN D) 1000 UNITS tablet Take 1,000 Units by mouth daily.   Yes [provider]  docusate sodium (COLACE) 50 MG capsule Take 50 mg by mouth daily.   Yes [provider]  furosemide (LASIX) 20 MG tablet Take 20 mg by mouth daily.   Yes [provider]  Glucosamine HCl (GLUCOSAMINE PO) Take 2,000 mg by mouth daily.   Yes [provider]  isosorbide mononitrate (IMDUR) 30 MG 24 hr tablet Take 0.5 tablets (15 mg total) by mouth daily. 02/05/20  Yes Richardo Priest, MD  Multiple Vitamin (MULTIVITAMIN) capsule Take 1 capsule by mouth daily.  04/28/19  Yes [provider]  nitroGLYCERIN (NITROSTAT) 0.4 MG SL tablet Place 1 tablet (0.4 mg total) under the tongue every 5 (five) minutes as needed. 07/14/19  Yes Richardo Priest, MD  Omega-3 Fatty Acids (FISH OIL) 1000 MG CAPS Take 1,000 mg by mouth daily.   Yes [provider]  omeprazole (PRILOSEC) 20 MG capsule Take 1 capsule (20 mg total) by mouth daily. 04/21/20  Yes Richardo Priest, MD  pravastatin (PRAVACHOL) 20 MG tablet TAKE ONE TABLET BY MOUTH ONCE DAILY Patient taking differently: Take 20 mg by mouth daily. 01/08/20  Yes Richardo Priest, MD  vitamin B-12 (CYANOCOBALAMIN) 500 MCG tablet Take 500 mcg by mouth daily.   Yes [provider]  warfarin (COUMADIN) 2.5 MG tablet Take 1 tablet daily or as directed by [facility] Patient taking differently: Take 2.5 mg by mouth daily. 04/16/20  Yes Richardo Priest, MD  cefpodoxime (VANTIN) 200 MG tablet Take 1 tablet (200 mg total) by mouth 2 (two) times daily. Patient not taking: No sig reported 04/23/20   Molpus, Jenny Reichmann, MD     Vital Signs: BP (!) 146/61 (BP Location: Right Arm)   Pulse 61   Temp 98.1 F (36.7 C) (Oral)   Resp (!) 21   Ht 6' (1.829 m)   Wt 214 lb (97.1 kg)   SpO2 95%   BMI 29.02 kg/m   Physical Exam Vitals and nursing note reviewed.  Constitutional:      General: He is not in acute distress. Pulmonary:     Effort: Pulmonary effort is normal. No respiratory distress.  Abdominal:     Comments: Right renal subscapular drain sit with mild tenderness, no erythema, drainage, or active bleeding; approximately 25 cc thick pink fluid with debris in suction bulb.  Skin:    General: Skin is warm and dry.  Neurological:     Mental Status: He is alert.     Imaging: CT IMAGE GUIDED DRAINAGE BY PERCUTANEOUS CATHETER  Result Date: 05/18/2020 INDICATION: Enlarging subcapsular perinephric fluid collection concerning for abscess. EXAM: CT-guided drain placement MEDICATIONS: The patient is currently admitted to the hospital and receiving intravenous antibiotics. The antibiotics were administered within an appropriate time frame prior to the initiation of the procedure. ANESTHESIA/SEDATION: Fentanyl 50 mcg IV; Versed 1 mg IV Moderate Sedation Time:  12 minutes The patient was continuously  monitored during the procedure by the interventional radiology nurse under my direct supervision. COMPLICATIONS: None immediate. PROCEDURE: Informed written consent was obtained from the patient after a thorough discussion of the procedural risks, benefits and alternatives. All questions were addressed. Maximal Sterile Barrier Technique was utilized including caps, mask, sterile gowns, sterile gloves, sterile drape, hand hygiene and skin antiseptic. A timeout was performed prior to the initiation of the procedure. Planning CT scan of the abdomen was performed. Large subcapsular fluid collection identified in the anterolateral aspect of the right kidney. The patient was repositioned into the left lateral decubitus position. A suitable skin entry site was selected and marked. The overlying skin was sterilely prepped and draped in the standard fashion with chlorhexidine skin prep. Local anesthesia was attained by infiltration with 1% lidocaine. A small dermatotomy was made. Under intermittent CT guidance, an 18 gauge trocar needle was advanced into the fluid collection. A 0.035 wire was then coiled in the collection. The needle was removed. The skin tract was dilated to 12 [place]. A Cook 12 [place] all-purpose drainage catheter was advanced over the wire and formed. Aspiration yields 135 mL thick, purulent fluid. The catheter was gently flushed with saline and connected to JP bulb suction before being secured to the skin with 0 Prolene suture. Follow-up CT imaging demonstrates a well-positioned drainage catheter and significant decompression of the subcapsular perinephric fluid collection. IMPRESSION: Successful placement of 12 French percutaneous drainage catheter with aspiration of 135 mL frankly purulent fluid from the subcapsular perinephric fluid collection. Electronically Signed   By: Jacqulynn Cadet M.D.   On: 05/18/2020 12:46    Labs:  CBC: Recent Labs    05/17/20 0659 05/17/20 1819 05/18/20 0449  05/19/20 0515 05/20/20 0517  WBC 10.6*  --  10.5 13.6* 11.0*  HGB 9.7* 10.2* 9.8* 10.7* 10.6*  HCT 30.9* 33.4* 31.0* 34.5* 33.5*  PLT 294  --  306 326 361    COAGS: Recent Labs    04/22/20 2253 04/27/20 1102 05/17/20 0659 05/18/20 0449 05/19/20 0515 05/20/20 0517  INR 2.7*   < > 1.2 1.3* 1.5* 1.5*  APTT 43*  --   --   --   --   --    < > = values in this interval not displayed.    BMP: Recent Labs    07/14/19 1343 08/24/19 2011 09/08/19 0920 04/22/20 2253 05/17/20 0659 05/18/20 0449 05/19/20 0515 05/20/20 0517  NA 143 139 141   < > 135 136 137 136  K 4.4 3.9 4.1   < > 4.7 4.5 4.8 3.9  CL 109* 108 106   < > 105 104 104 102  CO2 18* 23 22   < > 24 24 26 27   GLUCOSE 131* 110* 107*   < > 125* 124* 135* 130*  BUN 30* 19 22   < > 23 22 20 20   CALCIUM 10.4* 9.6 10.0   < > 9.3 9.4 9.3 9.4  CREATININE 1.66* 1.43* 1.42*   < >  1.30* 1.22 1.40* 1.38*  GFRNONAA 37* 44* 44*   < > 53* 57* 49* 49*  GFRAA 43* 51* 51*  --   --   --   --   --    < > = values in this interval not displayed.    LIVER FUNCTION TESTS: Recent Labs    04/27/20 1102 05/17/20 0659 05/18/20 0449 05/19/20 0515  BILITOT 1.3* 0.9 0.8 1.1  AST 54* 16 20 16   ALT 19 9 9 8   ALKPHOS 103 71 69 68  PROT 7.5 5.8* 5.8* 6.0*  ALBUMIN 3.1* 2.3* 2.2* 2.4*    Assessment and Plan:  Right subcapsular perinephric fluid collection concerning for abscess s/p right renal subscapular drain placement in IR 05/18/2020. Right renal subscapular drain stable with approximately 25 cc thick pink fluid with debris in suction bulb (additional 80 cc output from drain in past 24 hours per chart). Continue current drain management- continue with Qshift flushes/monitor of output. Plan for repeat CT when output <10 cc/day (assess for possible removal). Recommend close urology follow-up. Further plans per TRH/GI- appreciate and agree with management. IR to follow.   Electronically Signed: Earley Abide, PA-C 05/20/2020,  10:44 AM   I spent a total of 15 Minutes at the the patient's bedside AND on the patient's hospital floor or unit, greater than 50% of which was counseling/coordinating care for right perinephric fluid collection s/p right renal subscapular drain placement.

## 2020-05-20 NOTE — TOC Initial Note (Signed)
Transition of Care [facility]) - Initial/Assessment Note    Patient Details  Name: [name] MRN: [id number] Date of Birth: [birth year]  Transition of Care [facility]) CM/SW Contact:    Trish Mage, LCSW Phone Number: 05/20/2020, 2:01 PM  Clinical Narrative:   CSW following patient for TOC needs.  Mr Lobo is already linked with [facility] for all HH needs.  Will need orders for resumption of services at d/c.  TOC will continue to follow during the course of hospitalization.                 Expected Discharge Plan: [place] Barriers to Discharge: No Barriers Identified   Patient Goals and CMS Choice        Expected Discharge Plan and Services Expected Discharge Plan: [place]                                              Prior Living Arrangements/Services                       Activities of Daily Living Home Assistive Devices/Equipment: Environmental consultant (specify type) ADL Screening (condition at time of admission) Patient's cognitive ability adequate to safely complete daily activities?: No Is the patient deaf or have difficulty hearing?: Yes Does the patient have difficulty seeing, even when wearing glasses/contacts?: No Does the patient have difficulty concentrating, remembering, or making decisions?: No Patient able to express need for assistance with ADLs?: Yes Does the patient have difficulty dressing or bathing?: Yes Independently performs ADLs?: No Communication: Independent Dressing (OT): Needs assistance Is this a change from baseline?: Change from baseline, expected to last <3days Grooming: Needs assistance Is this a change from baseline?: Change from baseline, expected to last <3 days Feeding: Independent Bathing: Needs assistance Is this a change from baseline?: Change from baseline, expected to last <3 days Toileting: Needs assistance Is this a change from baseline?: Change from baseline, expected to last <3  days In/Out Bed: Needs assistance Walks in Home: Needs assistance Does the patient have difficulty walking or climbing stairs?: Yes Weakness of Legs: None Weakness of Arms/Hands: None  Permission Sought/Granted                  Emotional Assessment              Admission diagnosis:  Perirectal abscess [K61.1] Patient Active Problem List   Diagnosis Date Noted  . Renal abscess   . Perirectal abscess 05/15/2020  . Pyelonephritis of right kidney 05/15/2020  . Presence of IVC filter 05/15/2020  . Constipation, chronic 05/15/2020  . Cysts of right kidney 05/15/2020  . Severe aortic stenosis   . OSA on CPAP   . History of prostate cancer   . History of peptic ulcer   . History of kidney stones   . GERD   . CAD (coronary artery disease), native coronary artery   . Arthritis   . Bulbous urethral stricture 12/03/2019  . Internal hemorrhoids 09/11/2018  . Noise effect on both inner ears 08/14/2018  . Presbycusis of both ears 08/14/2018  . 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 CKD (chronic kidney disease), stage III ([place])   . Chronic combined systolic and diastolic heart failure ([place]) 11/19/2017  . Cardiac pacemaker in situ 03/14/2017  . Personal history of  DVT and pulmonary embolism  (deep vein thrombosis) 01/24/2016  . OSA treated with BiPAP 11/15/2015  . Personal history of malignant neoplasm of prostate 04/05/2015  . Hypertensive heart disease 04/05/2015  . 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[place]) 04/05/2015  . CAD (coronary artery disease), native coronary artery 04/05/2015  . Aortic valve stenosis 04/05/2015  . Persistent atrial fibrillation ([place]) 09/19/2014  . Moderate persistent asthma without complication 16/11/9602  . Atrial fibrillation, persistent ([place]) 09/19/2014  . Angina pectoris ([place]) 09/18/2014  . Urinary incontinence 03/04/2014  . Obesity (BMI 30-39.9)   . Long term (current) use of anticoagulants 10/06/2011  . Mixed  hyperlipidemia 02/24/2010  . Gastro-esophageal reflux disease without esophagitis 09/24/2009   PCP:  Shirline Frees, MD Pharmacy:   [id number] - [place], [address] [place] [place] [address] Phone: [phone] Fax: [phone]  [facility] [place], [place] [address] [place] [address] Phone: [phone] Fax: [phone]  [facility] - [place], [place] - [address] [place] [address] Phone: [phone] Fax: [phone]     Social Determinants of Health (SDOH) Interventions    Readmission Risk Interventions No flowsheet data found.

## 2020-05-20 NOTE — Evaluation (Signed)
Physical Therapy Evaluation Patient Details Name: [name] MRN: [id number] DOB: 10/17/1931 Today's Date: 05/20/2020   History of Present Illness  87 y.o. male admitted on 05/15/20 who presents with concerns of rectal pain, found to have perirectal abscess, s/p I &D 05/16/20 and s/p 67F drain R renal subcapsular fluid collection 05/18/20.Marland Kitchen Also found to have 6g drop in hgb with heme pos stools, suspect GIB. 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  Clinical Impression  Pt is weak, and more cognitively impaired than his baseline, but improving daily.  I think we wore out getting him cleaned up from a urinary incontinence episode that he was unable to walk very far.  I anticipate he will progress well enough to d/c home with max [facility] services and his wife's assist.   PT to follow acutely for deficits listed below.      Follow Up Recommendations Home health PT;Supervision/Assistance - 24 hour    Equipment Recommendations  None recommended by PT    Recommendations for Other Services       Precautions / Restrictions Precautions Precautions: Fall;Other (comment) Precaution Comments: urinary incontinence, wears depends and pad at home      Mobility  Bed Mobility Overal bed mobility: Needs Assistance Bed Mobility: Supine to Sit     Supine to sit: Min assist     General bed mobility comments: Min hand held assist to pull up to sitting EOB.    Transfers Overall transfer level: Needs assistance Equipment used: Rolling walker (2 wheeled) Transfers: Sit to/from Stand Sit to Stand: Min assist         General transfer comment: Min assist to come to standing x 3.  Pt's initial stand had urinary incontinence despite the primo fit being  intact still, so we sat back down and donned a depends and pad that his wife brought from home.  Ambulation/Gait Ambulation/Gait assistance: Min assist Gait Distance (Feet): 8 Feet Assistive device: Rolling walker (2 wheeled) Gait Pattern/deviations: Step-through pattern;Shuffle;Trunk flexed     General Gait Details: chair to follow for safety, pt used to a rollator-type walker at home, decreased gait distance likely due to time and energy spent to clean up after incontinence episode.  Stairs            Wheelchair Mobility    Modified Rankin (Stroke Patients Only)       Balance Overall balance assessment: Needs assistance Sitting-balance support: Feet supported;Bilateral upper extremity supported Sitting balance-Leahy Scale: Fair     Standing balance support: Bilateral upper extremity supported Standing balance-Leahy Scale: Poor Standing balance comment: needs support from RW and therapist.                             Pertinent Vitals/Pain Pain Assessment: Faces Faces Pain Scale: Hurts little more Pain Location: rectum, abdomen Pain Descriptors / Indicators: Grimacing;Guarding Pain Intervention(s): Limited activity within patient's tolerance;Monitored during session;Repositioned    Home Living Family/patient expects to be discharged to:: Private residence Living Arrangements: Spouse/significant other Available Help at Discharge: Family;Available 24 hours/day Type of Home: House Home Access: Stairs to enter Entrance Stairs-Rails: None Entrance Stairs-Number of Steps: 1 Home Layout: One level Home Equipment: Walker - 4 wheels;Grab bars - tub/shower;Grab bars - toilet;Shower seat - built  in Additional Comments: wife has taken FMLA to be home to care for him.    Prior Function Level of Independence: Needs assistance   Gait / Transfers Assistance Needed: recently has been getting weaker (2 weeks PTA) needing assistance to walk with rollator, but before  that, was mod I with rollator  ADL's / Homemaking Assistance Needed: needs assist with bathing and dressing, but still actively participates in his own self care.  Comments: Per wife walks with rollator (maybe it is one of those platform rollators?), no reports of falls to the ground when asked today, just stumbles (despite prior reports)     Hand Dominance   Dominant Hand: Right    Extremity/Trunk Assessment   Upper Extremity Assessment Upper Extremity Assessment: Defer to OT evaluation    Lower Extremity Assessment Lower Extremity Assessment: Generalized weakness (L knee has had TKA and revision, mildly weaker than R 4/5 L 4+/5 R)    Cervical / Trunk Assessment Cervical / Trunk Assessment: Normal  Communication   Communication: HOH  Cognition Arousal/Alertness: Awake/alert Behavior During Therapy: WFL for tasks assessed/performed Overall Cognitive Status: Impaired/Different from baseline Area of Impairment: Memory;Safety/judgement;Following commands;Awareness;Problem solving                     Memory: Decreased short-term memory Following Commands: Follows one step commands with increased time Safety/Judgement: Decreased awareness of safety;Decreased awareness of deficits Awareness: Emergent Problem Solving: Slow processing General Comments: Per wife he is better than yesterday, but not back to baseline, slower to process, still a bit confused, but got good rest last night which helped.      General Comments      Exercises     Assessment/Plan    PT Assessment Patient needs continued PT services  PT Problem List Decreased strength;Decreased activity tolerance;Decreased mobility;Decreased balance;Decreased cognition;Decreased knowledge of use of DME;Decreased safety awareness;Pain       PT Treatment Interventions DME instruction;Gait training;Stair training;Functional mobility training;Therapeutic activities;Therapeutic exercise;Balance  training;Patient/family education    PT Goals (Current goals can be found in the Care Plan section)  Acute Rehab PT Goals Patient Stated Goal: to get stronger, clear the infection, get as many services at home as they can PT Goal Formulation: With patient/family Time For Goal Achievement: 06/03/20 Potential to Achieve Goals: Good    Frequency Min 3X/week   Barriers to discharge        Co-evaluation               AM-PAC PT "6 Clicks" Mobility  Outcome Measure Help needed turning from your back to your side while in a flat bed without using bedrails?: A Little Help needed moving from lying on your back to sitting on the side of a flat bed without using bedrails?: A Little Help needed moving to and from a bed to a chair (including a wheelchair)?: A Little Help needed standing up from a chair using your arms (e.g., wheelchair or bedside chair)?: A Little Help needed to walk in hospital room?: A Little Help needed climbing 3-5 steps with a railing? : A Little 6 Click Score: 18    End of Session Equipment Utilized During Treatment: Gait belt Activity Tolerance: Patient limited by fatigue Patient left: in chair;with call bell/phone within reach;with family/visitor present   PT Visit Diagnosis: Muscle weakness (generalized) (M62.81);Difficulty in walking, not elsewhere classified (R26.2);Pain Pain - Right/Left:  (rectum, abdomen) Pain - part of body:  (rectum, abdomen)    Time: 0258-5277 PT Time Calculation (min) (  ACUTE ONLY): 49 min   Charges:   PT Evaluation $PT Eval Moderate Complexity: 1 Mod PT Treatments $Gait Training: 8-22 mins $Therapeutic Activity: 8-22 mins       Verdene Lennert, PT, DPT  Acute Rehabilitation [phone] pager [phone]) [phone] office

## 2020-05-20 NOTE — Progress Notes (Signed)
Continues to decline the use of nocturnal CPAP. Wife at bedside states he does "fine as long as he is in a reclined position, which he prefers". Order changed to prn. They are aware that RT is available for further assistance should he wish to use it.

## 2020-05-21 DIAGNOSIS — N1831 Chronic kidney disease, stage 3a: Secondary | ICD-10-CM | POA: Diagnosis not present

## 2020-05-21 DIAGNOSIS — N12 Tubulo-interstitial nephritis, not specified as acute or chronic: Secondary | ICD-10-CM | POA: Diagnosis not present

## 2020-05-21 DIAGNOSIS — I4819 Other persistent atrial fibrillation: Secondary | ICD-10-CM | POA: Diagnosis not present

## 2020-05-21 DIAGNOSIS — I25118 Atherosclerotic heart disease of native coronary artery with other forms of angina pectoris: Secondary | ICD-10-CM | POA: Diagnosis not present

## 2020-05-21 DIAGNOSIS — N151 Renal and perinephric abscess: Secondary | ICD-10-CM | POA: Diagnosis not present

## 2020-05-21 DIAGNOSIS — K611 Rectal abscess: Secondary | ICD-10-CM | POA: Diagnosis not present

## 2020-05-21 LAB — CBC
HCT: 34.4 % — ABNORMAL LOW (ref 39.0–52.0)
Hemoglobin: 10.8 g/dL — ABNORMAL LOW (ref 13.0–17.0)
MCH: 25.4 pg — ABNORMAL LOW (ref 26.0–34.0)
MCHC: 31.4 g/dL (ref 30.0–36.0)
MCV: 80.8 fL (ref 80.0–100.0)
Platelets: 315 10*3/uL (ref 150–400)
RBC: 4.26 MIL/uL (ref 4.22–5.81)
RDW: 18.6 % — ABNORMAL HIGH (ref 11.5–15.5)
WBC: 10 10*3/uL (ref 4.0–10.5)
nRBC: 0 % (ref 0.0–0.2)

## 2020-05-21 LAB — BASIC METABOLIC PANEL
Anion gap: 6 (ref 5–15)
BUN: 22 mg/dL (ref 8–23)
CO2: 25 mmol/L (ref 22–32)
Calcium: 9.2 mg/dL (ref 8.9–10.3)
Chloride: 106 mmol/L (ref 98–111)
Creatinine, Ser: 1.38 mg/dL — ABNORMAL HIGH (ref 0.61–1.24)
GFR, Estimated: 49 mL/min — ABNORMAL LOW (ref >60)
Glucose, Bld: 119 mg/dL — ABNORMAL HIGH (ref 70–99)
Potassium: 4.2 mmol/L (ref 3.5–5.1)
Sodium: 137 mmol/L (ref 135–145)

## 2020-05-21 LAB — AEROBIC/ANAEROBIC CULTURE W GRAM STAIN (SURGICAL/DEEP WOUND)

## 2020-05-21 LAB — PROTIME-INR
INR: 1.5 — ABNORMAL HIGH (ref 0.8–1.2)
Prothrombin Time: 17.7 s — ABNORMAL HIGH (ref 11.4–15.2)

## 2020-05-21 LAB — MAGNESIUM: Magnesium: 2 mg/dL (ref 1.7–2.4)

## 2020-05-21 MED ORDER — WARFARIN SODIUM 4 MG PO TABS
4.0000 mg | ORAL_TABLET | Freq: Once | ORAL | Status: AC
  Administered 2020-05-21: 4 mg via ORAL
  Filled 2020-05-21: qty 1

## 2020-05-21 NOTE — Progress Notes (Signed)
[name]  Date of Admission:  05/15/2020           Reason for visit: Follow up on perirectal and renal abscess  Current antibiotics: Meropenem (4/13--present)  Previous antibiotics: Piperacillin tazobactam (4/9--4/13)   ASSESSMENT:    1. Perirectal abscess secondary to ESBL E. coli status post I&D 4/10 2. Pyelonephritis, right renal subscapular abscess status post IR drain placement 4/12.  Cultures with GNR 3. ESBL infection 4. Nonobstructing nephrolithiasis 5. CKD  PLAN:    . Continue meropenem dosed for current renal function given ESBL infection . Follow-up cultures from right renal subscapular abscess. . Contact precautions . Monitor drain output.  Appreciate IR evaluation . Dr West Bali available as needed over the weekend, otherwise I will be back Monday   Principal Problem:   Perirectal abscess Active Problems:   Hypertensive heart disease   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)   Gastro-esophageal reflux disease without esophagitis   Long term (current) use of anticoagulants   Persistent atrial fibrillation (HCC)   CKD (chronic kidney disease), stage III (HCC)   S/P TAVR (transcatheter aortic valve replacement)   OSA on CPAP   CAD (coronary artery disease), native coronary artery   Atrial fibrillation, persistent (HCC)   Pyelonephritis of right kidney   Presence of IVC filter   Constipation, chronic   Cysts of right kidney   Renal abscess    MEDICATIONS:    Scheduled Meds: . amLODipine  2.5 mg Oral Daily  . feeding supplement  237 mL Oral BID BM  . furosemide  20 mg Oral Daily  . isosorbide mononitrate  15 mg Oral Daily  . polyethylene glycol  34 g Oral BID  . pravastatin  20 mg Oral Daily  . sodium chloride flush  5 mL Intracatheter Q8H  . vitamin B-12  500 mcg Oral Daily  . warfarin  4 mg Oral ONCE-1600  . Warfarin - Pharmacist Dosing Inpatient   Does not apply q1600    Continuous Infusions: . sodium chloride 10 mL/hr at 05/16/20 0401  . meropenem (MERREM) IV 1 g (05/21/20 1014)   PRN Meds:.acetaminophen, haloperidol lactate, HYDROcodone-acetaminophen  SUBJECTIVE:   24 hour events:  No acute events noted overnight 24-hour drain output measured as 65 cc Afebrile No new imaging Improved leukocytosis Stable renal function Renal abscess cultures pending    Patient seen with his wife this morning.  He reports some pain from prolonged sitting.  She states that he declined working with occupational therapy due to being tired.  She is going to reinforce his need to work with physical therapy later today.     OBJECTIVE:   Blood pressure (!) 111/42, pulse (!) 59, temperature (!) 97.5 F (36.4 C), temperature source Oral, resp. rate 18, height 6' (1.829 m), weight 97.1 kg, SpO2 95 %. Body mass index is 29.02 kg/m.  Physical Exam Constitutional:      Comments: Tired appearing, lying in bed, no acute distress  HENT:     Head: Normocephalic and atraumatic.  Eyes:     Extraocular Movements: Extraocular movements intact.     Conjunctiva/sclera: Conjunctivae normal.  Pulmonary:     Effort: Pulmonary effort is normal. No respiratory distress.  Abdominal:     General: There is no distension.     Palpations: Abdomen is soft.     Tenderness: There is no abdominal tenderness.  Genitourinary:    Comments: Right-sided IR drain in  place Skin:    General: Skin is warm and dry.     Findings: No rash.  Neurological:     Mental Status: Mental status is at baseline.      Lab Results: Lab Results  Component Value Date   WBC 10.0 05/21/2020   HGB 10.8 (L) 05/21/2020   HCT 34.4 (L) 05/21/2020   MCV 80.8 05/21/2020   PLT 315 05/21/2020    Lab Results  Component Value Date   NA 137 05/21/2020   K 4.2 05/21/2020   CO2 25 05/21/2020   GLUCOSE 119 (H) 05/21/2020   BUN 22 05/21/2020   CREATININE 1.38 (H) 05/21/2020   CALCIUM 9.2 05/21/2020    GFRNONAA 49 (L) 05/21/2020   GFRAA 51 (L) 09/08/2019    Lab Results  Component Value Date   ALT 8 05/19/2020   AST 16 05/19/2020   ALKPHOS 68 05/19/2020   BILITOT 1.1 05/19/2020       Component Value Date/Time   CRP <0.5 04/22/2013 1106       Component Value Date/Time   ESRSEDRATE 4 04/22/2013 1106     I have reviewed the micro and lab results in Epic.  Imaging: No results found.   Imaging  independently reviewed in Epic.    [name]ious Disease [facility] [phone] pager 05/21/2020, 11:09 AM

## 2020-05-21 NOTE — Care Management Important Message (Signed)
Medicare IM printed to give to the patient, by Adasyn Mcadams 

## 2020-05-21 NOTE — Progress Notes (Signed)
Referring Physician(s): Chiu,S  Supervising Physician: Ruthann Cancer  Patient Status:  [facility] - In-pt  Chief Complaint: Right renal subcapsular abscess/flank pain   Subjective: Patient slightly drowsy, but arousable; answers simple questions okay.  Spouse in room.  Has some soreness at right lateral abdominal drain site.  Denies nausea/ vomiting; has been getting out of bed to chair occasionally.   Allergies: Darifenacin, Codeine, Sulfamethoxazole-trimethoprim, and Lisinopril  Medications: Prior to Admission medications   Medication Sig Start Date End Date Taking? Authorizing Provider  acetaminophen (TYLENOL) 500 MG tablet Take 500 mg by mouth at bedtime.    Yes [provider]  amLODipine (NORVASC) 2.5 MG tablet Take 1 tablet (2.5 mg total) by mouth daily. 09/03/19  Yes Tobb, Kardie, DO  Ascorbic Acid (VITAMIN C) 1000 MG tablet Take 1,000 mg by mouth daily.   Yes [provider]  Biotin 2500 MCG CAPS Take 2,500 mcg by mouth daily.   Yes [provider]  cholecalciferol (VITAMIN D) 1000 UNITS tablet Take 1,000 Units by mouth daily.   Yes [provider]  docusate sodium (COLACE) 50 MG capsule Take 50 mg by mouth daily.   Yes [provider]  furosemide (LASIX) 20 MG tablet Take 20 mg by mouth daily.   Yes [provider]  Glucosamine HCl (GLUCOSAMINE PO) Take 2,000 mg by mouth daily.   Yes [provider]  isosorbide mononitrate (IMDUR) 30 MG 24 hr tablet Take 0.5 tablets (15 mg total) by mouth daily. 02/05/20  Yes Richardo Priest, MD  Multiple Vitamin (MULTIVITAMIN) capsule Take 1 capsule by mouth daily.  04/28/19  Yes [provider]  nitroGLYCERIN (NITROSTAT) 0.4 MG SL tablet Place 1 tablet (0.4 mg total) under the tongue every 5 (five) minutes as needed. 07/14/19  Yes Richardo Priest, MD  Omega-3 Fatty Acids (FISH OIL) 1000 MG CAPS Take 1,000 mg by mouth daily.   Yes [provider]  omeprazole  (PRILOSEC) 20 MG capsule Take 1 capsule (20 mg total) by mouth daily. 04/21/20  Yes Richardo Priest, MD  pravastatin (PRAVACHOL) 20 MG tablet TAKE ONE TABLET BY MOUTH ONCE DAILY Patient taking differently: Take 20 mg by mouth daily. 01/08/20  Yes Richardo Priest, MD  vitamin B-12 (CYANOCOBALAMIN) 500 MCG tablet Take 500 mcg by mouth daily.   Yes [provider]  warfarin (COUMADIN) 2.5 MG tablet Take 1 tablet daily or as directed by [facility] Patient taking differently: Take 2.5 mg by mouth daily. 04/16/20  Yes Richardo Priest, MD  cefpodoxime (VANTIN) 200 MG tablet Take 1 tablet (200 mg total) by mouth 2 (two) times daily. Patient not taking: No sig reported 04/23/20   Molpus, Jenny Reichmann, MD     Vital Signs: BP (!) 111/42 (BP Location: Right Arm)   Pulse (!) 59   Temp (!) 97.5 F (36.4 C) (Oral)   Resp 18   Ht 6' (1.829 m)   Wt 214 lb (97.1 kg)   SpO2 95%   BMI 29.02 kg/m   Physical Exam drowsy but arousable, right renal drain intact, insertion site okay, mild to moderately tender to palpation, output 65 cc turbid pink-colored fluid.  Imaging: CT IMAGE GUIDED DRAINAGE BY PERCUTANEOUS CATHETER  Result Date: 05/18/2020 INDICATION: Enlarging subcapsular perinephric fluid collection concerning for abscess. EXAM: CT-guided drain placement MEDICATIONS: The patient is currently admitted to the hospital and receiving intravenous antibiotics. The antibiotics were administered within an appropriate time frame prior to the initiation of the procedure.  ANESTHESIA/SEDATION: Fentanyl 50 mcg IV; Versed 1 mg IV Moderate Sedation Time:  12 minutes The patient was continuously monitored during the procedure by the interventional radiology nurse under my direct supervision. COMPLICATIONS: None immediate. PROCEDURE: Informed written consent was obtained from the patient after a thorough discussion of the procedural risks, benefits and alternatives. All questions were addressed. Maximal Sterile Barrier  Technique was utilized including caps, mask, sterile gowns, sterile gloves, sterile drape, hand hygiene and skin antiseptic. A timeout was performed prior to the initiation of the procedure. Planning CT scan of the abdomen was performed. Large subcapsular fluid collection identified in the anterolateral aspect of the right kidney. The patient was repositioned into the left lateral decubitus position. A suitable skin entry site was selected and marked. The overlying skin was sterilely prepped and draped in the standard fashion with chlorhexidine skin prep. Local anesthesia was attained by infiltration with 1% lidocaine. A small dermatotomy was made. Under intermittent CT guidance, an 18 gauge trocar needle was advanced into the fluid collection. A 0.035 wire was then coiled in the collection. The needle was removed. The skin tract was dilated to 12 [place]. A Cook 12 [place] all-purpose drainage catheter was advanced over the wire and formed. Aspiration yields 135 mL thick, purulent fluid. The catheter was gently flushed with saline and connected to JP bulb suction before being secured to the skin with 0 Prolene suture. Follow-up CT imaging demonstrates a well-positioned drainage catheter and significant decompression of the subcapsular perinephric fluid collection. IMPRESSION: Successful placement of 12 French percutaneous drainage catheter with aspiration of 135 mL frankly purulent fluid from the subcapsular perinephric fluid collection. Electronically Signed   By: Jacqulynn Cadet M.D.   On: 05/18/2020 12:46    Labs:  CBC: Recent Labs    05/18/20 0449 05/19/20 0515 05/20/20 0517 05/21/20 0517  WBC 10.5 13.6* 11.0* 10.0  HGB 9.8* 10.7* 10.6* 10.8*  HCT 31.0* 34.5* 33.5* 34.4*  PLT 306 326 361 315    COAGS: Recent Labs    04/22/20 2253 04/27/20 1102 05/18/20 0449 05/19/20 0515 05/20/20 0517 05/21/20 0517  INR 2.7*   < > 1.3* 1.5* 1.5* 1.5*  APTT 43*  --   --   --   --   --    < > = values  in this interval not displayed.    BMP: Recent Labs    07/14/19 1343 08/24/19 2011 09/08/19 0920 04/22/20 2253 05/18/20 0449 05/19/20 0515 05/20/20 0517 05/21/20 0517  NA 143 139 141   < > 136 137 136 137  K 4.4 3.9 4.1   < > 4.5 4.8 3.9 4.2  CL 109* 108 106   < > 104 104 102 106  CO2 18* 23 22   < > 24 26 27 25   GLUCOSE 131* 110* 107*   < > 124* 135* 130* 119*  BUN 30* 19 22   < > 22 20 20 22   CALCIUM 10.4* 9.6 10.0   < > 9.4 9.3 9.4 9.2  CREATININE 1.66* 1.43* 1.42*   < > 1.22 1.40* 1.38* 1.38*  GFRNONAA 37* 44* 44*   < > 57* 49* 49* 49*  GFRAA 43* 51* 51*  --   --   --   --   --    < > = values in this interval not displayed.    LIVER FUNCTION TESTS: Recent Labs    04/27/20 1102 05/17/20 0659 05/18/20 0449 05/19/20 0515  BILITOT 1.3* 0.9 0.8 1.1  AST  54* 16 20 16   ALT 19 9 9 8   ALKPHOS 103 71 69 68  PROT 7.5 5.8* 5.8* 6.0*  ALBUMIN 3.1* 2.3* 2.2* 2.4*    Assessment and Plan: Patient with history of recent I&D of perirectal abscess, right nephrolithiasis/ pyelonephritis with enlarging right renal subcapsular fluid collection, status post drain placement on 4/12; afebrile, WBC normal, hemoglobin stable, drain fluid cultures pending, creatinine 1.38; continue with drain irrigation, output/lab monitoring.  Encourage hydration; once drain output minimal or if clinical status worsens, obtain follow-up CT; encourage urology follow-up; will f/u with pt next week unless clinical status changes   Electronically Signed: D. Rowe Robert, PA-C 05/21/2020, 1:17 PM   I spent a total of 15 minutes at the the patient's bedside AND on the patient's hospital floor or unit, greater than 50% of which was counseling/coordinating care for right renal subcapsular abscess drain    Patient ID: [name], male   DOB: 29-Nov-1931, 87 y.o.   MRN: [id number]

## 2020-05-21 NOTE — Progress Notes (Signed)
PROGRESS NOTE    [name]  JQB:[id number] DOB: 02-25-1931 DOA: 05/15/2020 PCP: Shirline Frees, MD    Chief Complaint  Patient presents with  . 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 Also found to have 6g drop in hgb with heme pos stools on presentation. Patient also noted to have a renal abscess status post drain placement with cultures pending.   Assessment & Plan:   Principal Problem:   Perirectal abscess Active Problems:   Hypertensive heart disease   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)   Gastro-esophageal reflux disease without esophagitis   Long term (current) use of anticoagulants   Persistent atrial fibrillation (HCC)   CKD (chronic kidney disease), stage III (HCC)   S/P TAVR (transcatheter aortic valve replacement)   OSA on CPAP   CAD (coronary artery disease), native coronary artery   Atrial fibrillation, persistent (HCC)   Pyelonephritis of right kidney   Presence of IVC filter   Constipation, chronic   Cysts of right kidney   Renal abscess  1 perirectal abscess secondary to ESBL E. coli status post I&D 4/10 -Noted on CT abdomen and pelvis. -General surgery consulted and patient underwent incision and drainage(4/10) per general surgery. -Cultures from incision and drainage with abundant ESBL E. coli sensitive to Zosyn, Cipro, gentamicin, imipenem, Bactrim and resistant to Unasyn, ampicillin, cefazolin, ceftazidime, Rocephin with intermediate sensitivity to cefepime. -Due to concurrent renal abscess ID consulted and IV antibiotics changed to IV Merrem.   -Continue sitz bath's as  recommended per general surgery.   -General surgery was following but have signed off.   -Outpatient follow-up with general surgery.   2.  Right renal subcapsular fluid collection/renal abscess/?  Persistent pyelonephritis -Patient noted to have a right renal subcapsular fluid collection/renal abscess, -Patient noted to have had a UTI approximately a month ago placed on Bactrim later reevaluated in the ED placed on cefpodoxime.  Urine cultures with no growth. -Cultures from this admission negative. -Unable to perform MRI secondary to pacemaker. -Dr Wyline Copas discussed with urology who recommended drainage per IR. -Status post drain placement per IR with purulent fluid noted and sent for cultures. -Cultures pending. -Continue IV Merrem for ESBL per ID recommendations. -ID following and appreciate input and recommendations.  3.  Anemia/melena -Concern for acute GI bleed in the setting of Coumadin use. -Patient noted to be followed closely at [facility] for GI for history of hemorrhoids in the past. -Hemoglobin noted to have gone down to 9.7 from 16.2 (04/27/2020). -Hemoglobin currently stable at 10.8 -Patient with no overt bleeding. -Stools noted to be heme positive. -Dr. Wyline Copas discussed with GI on-call, and given recent surgical drainage of perirectal abscess recommendation is to hold off on inpatient endoscopy for now and follow-up in the outpatient setting when more stable. -Coumadin has been resumed.  Monitor closely.  4.  History of persistent atrial fibrillation - CHA2DSVASC score 5 -Coumadin was held due to concerns for anemia/melena.   -Coumadin resumed.   -H&H stable.   -Follow.  5.  History of recurrent DVT/PE with supratherapeutic INR -Patient noted to have a history of provoked DVT following prostatectomy status post IVC placement afterwards. -Patient noted to have developed bacteremia and  PE and DVT and subsequently placed on warfarin. -Patient's PE and DVT noted to be  remote. -Coumadin held due to concern for dark stools overnight. -Coumadin resumed.   -Hemoglobin stable at 10.8.  6.  History of heart block status post PPM -Stable. -Outpatient follow-up.  7.  History of aortic stenosis status post TAVR -Appears to be a bioprosthetic valve. -Outpatient follow-up.  8.  Coronary artery disease status post stent -Continue home regimen Lasix, Imdur, Pravachol, Norvasc.   9.  OSA Patient noted to be refusing CPAP at bedtime and has been changed to as needed.  10.  Chronic kidney disease stage IIIa - Stable.  11.  Borderline blood pressure -Patient noted to have borderline blood pressure today. -Hold patient's home regimen Lasix and Norvasc. -Monitor BP on Imdur.    DVT prophylaxis: SCDs Code Status: Full Family Communication: Updated patient and wife at bedside. Disposition:   Status is: Inpatient    Dispo: The patient is from: Home              Anticipated d/c is to: Likely home              Patient currently with a perirectal abscess status post incision and drainage with cultures of ESBL, renal abscess status post drain placement per IR, cultures pending, on IV antibiotics, ID following.  Not stable for discharge.   Difficult to place patient no       Consultants:   General surgery: Dr. Donne Hazel 05/16/2020  Interventional radiology: Dr. Laurence Ferrari 05/17/2020  Infectious disease: Dr. Juleen China 05/19/2020  Procedures:   CT abdomen and pelvis 05/15/2020  Placement of 12 French percutaneous drainage catheter with aspiration of 135 mL of frankly pleural fluid from subscapular perinephric fluid collection-Per IR, Dr. Laurence Ferrari 05/18/2020  Incision and drainage of perirectal abscess per general surgery, Dr. Donne Hazel 05/16/2020  Antimicrobials:   IV Merrem 05/19/2020>>>>  IV Zosyn 05/15/2020 >>>> 05/19/2020    Subjective: Patient sleeping but arousable.  Per wife patient sleeping more with poor appetite.  Patient denies any chest  pain.  No shortness of breath.  No abdominal pain.  Rectal pain with some improvement with sitz bath's.  Patient denies any bleeding.  Objective: Vitals:   05/19/20 2038 05/20/20 1342 05/20/20 2140 05/21/20 0537  BP: (!) 146/61 135/66 (!) 142/63 (!) 111/42  Pulse: 61 64 65 (!) 59  Resp: (!) 21 20 18 18   Temp: 98.1 F (36.7 C) 98.1 F (36.7 C) 98.1 F (36.7 C) (!) 97.5 F (36.4 C)  TempSrc: Oral  Oral Oral  SpO2: 95% 94% 93% 95%  Weight:      Height:        Intake/Output Summary (Last 24 hours) at 05/21/2020 1238 Last data filed at 05/21/2020 1210 Gross per 24 hour  Intake 220 ml  Output 765 ml  Net -545 ml   Filed Weights   05/15/20 1923  Weight: 97.1 kg    Examination:  General exam: : NAD Respiratory system: CTA B anterior lung fields.  No wheezes, no rhonchi.  Speaking in full sentences.  Normal respiratory effort. Cardiovascular system: Regular rate and rhythm no murmurs rubs or gallops.  No JVD.  No lower extremity edema.  Gastrointestinal system: Abdomen soft, some slight diffuse tenderness to palpation, positive bowel sounds.  No rebound.  No guarding.  JP drain with some purulent drainage. Central nervous system: Alert and oriented. No focal neurological deficits. Extremities: Symmetric 5 x 5 power. Skin: No rashes, lesions or ulcers Psychiatry: Judgement  and insight appear normal. Mood & affect appropriate..     Data Reviewed: I have personally reviewed following labs and imaging studies  CBC: Recent Labs  Lab 05/15/20 2000 05/16/20 0611 05/17/20 0659 05/17/20 1819 05/18/20 0449 05/19/20 0515 05/20/20 0517 05/21/20 0517  WBC 11.8*   < > 10.6*  --  10.5 13.6* 11.0* 10.0  NEUTROABS 8.9*  --   --   --   --   --   --   --   HGB 10.4*   < > 9.7* 10.2* 9.8* 10.7* 10.6* 10.8*  HCT 32.8*   < > 30.9* 33.4* 31.0* 34.5* 33.5* 34.4*  MCV 80.4   < > 79.8*  --  80.7 81.4 80.1 80.8  PLT 283   < > 294  --  306 326 361 315   < > = values in this interval not  displayed.    Basic Metabolic Panel: Recent Labs  Lab 05/16/20 0611 05/17/20 0659 05/18/20 0449 05/19/20 0515 05/20/20 0517 05/21/20 0517  NA 136 135 136 137 136 137  K 4.7 4.7 4.5 4.8 3.9 4.2  CL 103 105 104 104 102 106  CO2 26 24 24 26 27 25   GLUCOSE 99 125* 124* 135* 130* 119*  BUN 24* 23 22 20 20 22   CREATININE 1.29* 1.30* 1.22 1.40* 1.38* 1.38*  CALCIUM 8.9 9.3 9.4 9.3 9.4 9.2  MG  --   --   --   --  1.9 2.0  PHOS 3.4  --   --   --   --   --     GFR: Estimated Creatinine Clearance: 45.6 mL/min (A) (by C-G formula based on SCr of 1.38 mg/dL (H)).  Liver Function Tests: Recent Labs  Lab 05/17/20 0659 05/18/20 0449 05/19/20 0515  AST 16 20 16   ALT 9 9 8   ALKPHOS 71 69 68  BILITOT 0.9 0.8 1.1  PROT 5.8* 5.8* 6.0*  ALBUMIN 2.3* 2.2* 2.4*    CBG: No results for input(s): GLUCAP in the last 168 hours.   Recent Results (from the past 240 hour(s))  Resp Panel by RT-PCR (Flu A&B, Covid) Nasopharyngeal Swab     Status: None   Collection Time: 05/16/20  8:06 AM   Specimen: Nasopharyngeal Swab; Nasopharyngeal(NP) swabs in vial transport medium  Result Value Ref Range Status   SARS Coronavirus 2 by RT PCR NEGATIVE NEGATIVE Final    Comment: (NOTE) SARS-CoV-2 target nucleic acids are NOT DETECTED.  The SARS-CoV-2 RNA is generally detectable in upper respiratory specimens during the acute phase of infection. The lowest concentration of SARS-CoV-2 viral copies this assay can detect is 138 copies/mL. A negative result does not preclude SARS-Cov-2 infection and should not be used as the sole basis for treatment or other patient management decisions. A negative result may occur with  improper specimen collection/handling, submission of specimen other than nasopharyngeal swab, presence of viral mutation(s) within the areas targeted by this assay, and inadequate number of viral copies(<138 copies/mL). A negative result must be combined with clinical observations,  patient history, and epidemiological information. The expected result is Negative.  Fact Sheet for Patients:  EntrepreneurPulse.com.au  Fact Sheet for Healthcare Providers:  IncredibleEmployment.be  This test is no t yet approved or cleared by the [place] FDA and  has been authorized for detection and/or diagnosis of SARS-CoV-2 by FDA under an Emergency Use Authorization (EUA). This EUA will remain  in effect (meaning this test can be used) for the duration of the  COVID-19 declaration under Section 564(b)(1) of the Act, 21 U.S.C.section 360bbb-3(b)(1), unless the authorization is terminated  or revoked sooner.       Influenza A by PCR NEGATIVE NEGATIVE Final   Influenza B by PCR NEGATIVE NEGATIVE Final    Comment: (NOTE) The Xpert Xpress SARS-CoV-2/FLU/RSV plus assay is intended as an aid in the diagnosis of influenza from Nasopharyngeal swab specimens and should not be used as a sole basis for treatment. Nasal washings and aspirates are unacceptable for Xpert Xpress SARS-CoV-2/FLU/RSV testing.  Fact Sheet for Patients: EntrepreneurPulse.com.au  Fact Sheet for Healthcare Providers: IncredibleEmployment.be  This test is not yet approved or cleared by the [place] FDA and has been authorized for detection and/or diagnosis of SARS-CoV-2 by FDA under an Emergency Use Authorization (EUA). This EUA will remain in effect (meaning this test can be used) for the duration of the COVID-19 declaration under Section 564(b)(1) of the Act, 21 U.S.C. section 360bbb-3(b)(1), unless the authorization is terminated or revoked.  Performed at [facility], [place] [address], [place], [place] [address]   Aerobic/Anaerobic Culture w Gram Stain (surgical/deep wound)     Status: None (Preliminary result)   Collection Time: 05/16/20 11:22 AM   Specimen: PATH Other; Tissue  Result Value Ref Range  Status   Specimen Description   Final    TISSUE Performed at [place] [address], [place], [place] [address]    Special Requests   Final    NONE Performed at [facility], [place] [address] [place], [place] [address]    Gram Stain   Final    MODERATE WBC PRESENT, PREDOMINANTLY PMN FEW GRAM POSITIVE COCCI IN PAIRS MODERATE GRAM NEGATIVE RODS Performed at [facility], [place] [address] [place], [place] [address]    Culture   Final    ABUNDANT ESCHERICHIA COLI Confirmed Extended Spectrum Beta-Lactamase Producer (ESBL).  In bloodstream infections from ESBL organisms, carbapenems are preferred over piperacillin/tazobactam. They are shown to have a lower risk of mortality. NO ANAEROBES ISOLATED; CULTURE IN PROGRESS FOR 5 DAYS    Report Status PENDING  Incomplete   Organism ID, Bacteria ESCHERICHIA COLI  Final      Susceptibility   Escherichia coli - MIC*    AMPICILLIN >=32 RESISTANT Resistant     CEFAZOLIN >=64 RESISTANT Resistant     CEFEPIME 8 INTERMEDIATE Intermediate     CEFTAZIDIME RESISTANT Resistant     CEFTRIAXONE >=64 RESISTANT Resistant     CIPROFLOXACIN <=0.25 SENSITIVE Sensitive     GENTAMICIN <=1 SENSITIVE Sensitive     IMIPENEM <=0.25 SENSITIVE Sensitive     TRIMETH/SULFA <=20 SENSITIVE Sensitive     AMPICILLIN/SULBACTAM >=32 RESISTANT Resistant     PIP/TAZO 8 SENSITIVE Sensitive     * ABUNDANT ESCHERICHIA COLI  Aerobic/Anaerobic Culture (surgical/deep wound)     Status: None (Preliminary result)   Collection Time: 05/18/20 12:01 PM   Specimen: Abscess  Result Value Ref Range Status   Specimen Description   Final    ABSCESS RT KIDNEY Performed at [place] [address], [place], [place] [address]    Special Requests   Final    Normal Performed at [facility], [place] [address] [place], [place] [address]    Gram Stain   Final    ABUNDANT WBC PRESENT,BOTH PMN AND  MONONUCLEAR FEW GRAM NEGATIVE RODS Performed at [facility], [place] [address], [place], [place] [address]  Culture   Final    ABUNDANT ESCHERICHIA COLI Confirmed Extended Spectrum Beta-Lactamase Producer (ESBL).  In bloodstream infections from ESBL organisms, carbapenems are preferred over piperacillin/tazobactam. They are shown to have a lower risk of mortality. NO ANAEROBES ISOLATED; CULTURE IN PROGRESS FOR 5 DAYS    Report Status PENDING  Incomplete   Organism ID, Bacteria ESCHERICHIA COLI  Final      Susceptibility   Escherichia coli - MIC*    AMPICILLIN >=32 RESISTANT Resistant     CEFAZOLIN >=64 RESISTANT Resistant     CEFEPIME 8 INTERMEDIATE Intermediate     CEFTAZIDIME RESISTANT Resistant     CEFTRIAXONE >=64 RESISTANT Resistant     CIPROFLOXACIN <=0.25 SENSITIVE Sensitive     GENTAMICIN <=1 SENSITIVE Sensitive     IMIPENEM <=0.25 SENSITIVE Sensitive     TRIMETH/SULFA <=20 SENSITIVE Sensitive     AMPICILLIN/SULBACTAM >=32 RESISTANT Resistant     PIP/TAZO 8 SENSITIVE Sensitive     * ABUNDANT ESCHERICHIA COLI         Radiology Studies: No results found.      Scheduled Meds: . feeding supplement  237 mL Oral BID BM  . isosorbide mononitrate  15 mg Oral Daily  . polyethylene glycol  34 g Oral BID  . pravastatin  20 mg Oral Daily  . sodium chloride flush  5 mL Intracatheter Q8H  . vitamin B-12  500 mcg Oral Daily  . warfarin  4 mg Oral ONCE-1600  . Warfarin - Pharmacist Dosing Inpatient   Does not apply q1600   Continuous Infusions: . sodium chloride 10 mL/hr at 05/16/20 0401  . meropenem (MERREM) IV 1 g (05/21/20 1014)     LOS: 5 days    Time spent: 35 minutes    Irine Seal, MD [facility]   To contact the attending provider between 7A-7P or the covering provider during after hours 7P-7A, please log into the web site [email or web address] and access using universal [place] password for that web site. If you do not have the password,  please call the hospital operator.  05/21/2020, 12:38 PM

## 2020-05-21 NOTE — Progress Notes (Signed)
OT Cancellation Note  Patient Details Name: HENCE DERRICK MRN: [id number] DOB: Feb 15, 1931   Cancelled Treatment:    Reason Eval/Treat Not Completed: Fatigue/lethargy limiting ability to participate. Initially patient agreeable to therapy, then when asked to mobilize OOB patient reports he is too tried. Despite encouragement patient declining OOB at this time. Will re-attempt as schedule permits.  Delbert Phenix OT OT pager: [place] 05/21/2020, 10:24 AM

## 2020-05-21 NOTE — Progress Notes (Signed)
ANTICOAGULATION CONSULT NOTE - Initial Consult  Pharmacy Consult for Warfarin Indication: history of DVT/PE, atrial fibrillation   Allergies  Allergen Reactions  . Darifenacin Nausea Only and Other (See Comments)    dry mouth and dizziness ENABLEX Dizziness Pt denies  . Codeine Other (See Comments)  . Sulfamethoxazole-Trimethoprim Other (See Comments)  . Lisinopril Cough    Pt denies    Patient Measurements: Height: 6' (182.9 cm) Weight: 97.1 kg (214 lb) IBW/kg (Calculated) : 77.6   Vital Signs: Temp: 97.5 F (36.4 C) (04/15 0537) Temp Source: Oral (04/15 0537) BP: 111/42 (04/15 0537) Pulse Rate: 59 (04/15 0537)  Labs: Recent Labs    05/19/20 0515 05/20/20 0517 05/21/20 0517  HGB 10.7* 10.6* 10.8*  HCT 34.5* 33.5* 34.4*  PLT 326 361 315  LABPROT 18.5* 18.0* 17.7*  INR 1.5* 1.5* 1.5*  CREATININE 1.40* 1.38* 1.38*    Estimated Creatinine Clearance: 45.6 mL/min (A) (by C-G formula based on SCr of 1.38 mg/dL (H)).   Medical History: Past Medical History:  Diagnosis Date  . Aortic valve stenosis 04/05/2015   Formatting of this note might be different from the original. Overview:  ECHO 5/15 Formatting of this note might be different from the original. Overview:  Overview:  ECHO 5/15  . Arthritis    "knees, right shoulder" (03/14/2017)  . Atrial fibrillation, persistent ([place]) 09/19/2014  . CAD (coronary artery disease), native coronary artery    Cath 2011 LHC (08/2009):~ Proximal LAD 30%, mid to distal LAD 25%, ostial small D1 75% mid AV groove circumflex 99% been subtotal stenosis, proximal to mid RCA 25-30%, mid RCA 30%, mid PDA 30%, EF 50% with inferior hypokinesis.;    July 2011  PCI and DES to circumflex Dr. Olevia Perches   ETT-Myoview (06/2013):  Inferolateral scar, mild peri-infarct ischemia, EF 40%; Intermediate Risk   ECHO EF 55% 03/2013   . CAD (coronary artery disease), native coronary artery 04/05/2015   Cath 2011 [facility] (08/2009):~ Proximal LAD 30%, mid to distal LAD  25%, ostial small D1 75% mid AV groove circumflex 99% been subtotal stenosis, proximal to mid RCA 25-30%, mid RCA 30%, mid PDA 30%, EF 50% with inferior hypokinesis.;    July 2011  PCI and DES to circumflex Dr. Olevia Perches   ETT-Myoview (06/2013):  Inferolateral scar, mild peri-infarct ischemia, EF 40%; Intermediate Risk   ECHO EF 55% 03/2013    . Cardiac pacemaker in situ 03/14/2017   Biventricular St. Jude inserted 03/14/17 Dr. Lovena Le for second degree heart block   . Chronic combined systolic and diastolic heart failure ([place]) 11/19/2017  . CKD (chronic kidney disease), stage III ([place])   . Gastro-esophageal reflux disease without esophagitis 09/24/2009  . GERD   . History of infection of prosthetic knee 04/05/2015   Treated with debridement and irrigation followed by 6 months of triple antibiotics  . History of kidney stones   . History of peptic ulcer 1970s?  Marland Kitchen History of prostate cancer    Radical prostatectomy in 2006 Dr. Terance Hart   . Hypertensive heart disease   . Internal hemorrhoids 09/11/2018  . Long term (current) use of anticoagulants 10/06/2011  . Mixed hyperlipidemia   . Moderate persistent asthma without complication 09/24/2991  . Noise effect on both inner ears 08/14/2018  . Obesity (BMI 30-39.9)   . OSA on CPAP   . OSA treated with BiPAP 11/15/2015  . Persistent atrial fibrillation ([place]) 09/19/2014   CHA2DS2VASC score 5  Cardioversion 10/08/2014 was on amiodarone until 2018   .  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)    Initial DVT in 2006 after prostate surgery and had [place] filter placed Bilateral  PE 2013 and placed back on warfarin DVT of right subclavian vein on doppler 10/2012 at time of knee infection   . Personal history of malignant neoplasm of prostate    Radical prostatectomy in 2006 Dr. Terance Hart    . Presbycusis of both ears 08/14/2018  . Severe aortic stenosis   . Severe aortic stenosis 03/18/2018  . Urinary incontinence    MULTIPLE BLADDER  SURGERIES - STATES NO URINARY SPHINCTER - PT'S UROLOGIST IS AT [facility]- DR. PETERSON  ( LAST VISIT WAS 09/15/11 )    Medications:  PTA warfarin regimen: 2.5mg  PO daily-last dose reported 05/14/2020 at 2100  Assessment: 41 y/oM with PMH of prostate CA, aortic stenosis s/p TAVR, atrial fibrillation, history of recurrent  (remote) DVT/PE s/p IVC filter on warfarin who presented with rectal bleeding. On admission, Hgb 10.4 (down from 16.2 on 04/27/20), INR supratherapeutic at 3.3.   Significant events:  4/9 PM: Vitamin K 2.5mg  PO x 1  4/10: INR 3.4 in AM; decision was made for urgent I&D of perirectal abscess, so MD gave Eppie Gibson and Vitamin K 10mg  IV x 1 prior to procedure. Postprocedure INR 1.4 and Warfarin resumed in PM  4/11: warfarin held d/t need for drainage of R subcapsular renal fluid collection  4/14: resuming warfarin  Today, 05/21/2020:  INR remains subtherapeutic but slightly increased from nadir  Hgb low but stable; Plt stable WNL  No further rectal bleeding noted or procedures planned  Eating 75-100% of meals  No major DDIs with warfarin noted  Goal of Therapy:  INR 2-3 Monitor platelets by anticoagulation protocol: Yes   Plan:   Warfarin 4 mg PO x 1 today  Daily PT/INR, CBC  Monitor for s/sx of bleeding  [place] Silveria P. Legrand Como, PharmD, [place] Please utilize Amion for appropriate phone number to reach the unit pharmacist ([place]) 05/21/2020 7:45 AM

## 2020-05-21 NOTE — Progress Notes (Signed)
Physical Therapy Treatment Patient Details Name: [name] MRN: [id number] DOB: 04-04-1931 Today's Date: 05/21/2020    History of Present Illness 87 y.o. male admitted on 05/15/20 who presents with concerns of rectal pain, found to have perirectal abscess, s/p I &D 05/16/20 and s/p 35F drain R renal subcapsular fluid collection 05/18/20.Marland Kitchen Also found to have 6g drop in hgb with heme pos stools, suspect GIB. 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    PT Comments    Pt with slowed processing time during session, per pt's wife he appears cognitively altered after receiving pain medications. Pt complaining of minimal buttocks pain during session, and mostly during bed mobility tasks. Pt ambulatory in hallway with rollator, requiring multiple standing rest breaks during session. Pt overall requiring min to mod physical assist, per pt's wife this is close to baseline and she can assist him at home. Will continue to follow acutely.    Follow Up Recommendations  Home health PT;Supervision/Assistance - 24 hour     Equipment Recommendations  None recommended by PT    Recommendations for Other Services       Precautions / Restrictions Precautions Precautions: Fall;Other (comment) Precaution Comments: urinary incontinence, wears depends and pad at home Restrictions Weight Bearing Restrictions: No    Mobility  Bed Mobility Overal bed mobility: Needs Assistance Bed Mobility: Supine to Sit     Supine to sit: Min assist;HOB elevated     General bed mobility comments: min assist for trunk elevation, LE translation to EOB. Increased time and effort.    Transfers Overall transfer level: Needs assistance Equipment used: Rolling walker (2  wheeled) Transfers: Sit to/from Stand Sit to Stand: Min assist;From elevated surface         General transfer comment: min assist for power up, rise, and steady, VC for hand placement when rising.  Ambulation/Gait Ambulation/Gait assistance: Min assist Gait Distance (Feet): 40 Feet (with standing rest breaks x2) Assistive device: 4-wheeled walker Gait Pattern/deviations: Decreased stride length;Step-through pattern;Trunk flexed Gait velocity: decr   General Gait Details: min assist to steady, verbal cuing for upright posture, keeping rollator closer to pt, rest breaks as needed to recover fatigue.   Stairs             Wheelchair Mobility    Modified Rankin (Stroke Patients Only)       Balance Overall balance assessment: Needs assistance Sitting-balance support: Feet supported;Bilateral upper extremity supported Sitting balance-Leahy Scale: Fair     Standing balance support: Bilateral upper extremity supported Standing balance-Leahy Scale: Poor Standing balance comment: needs support from RW and therapist.                            Cognition Arousal/Alertness: Awake/alert Behavior During Therapy: WFL for tasks assessed/performed Overall Cognitive Status: Impaired/Different from baseline Area of Impairment: Memory;Safety/judgement;Following commands;Awareness;Problem solving                     Memory: Decreased short-term memory Following Commands: Follows one step commands with increased time Safety/Judgement: Decreased awareness of safety;Decreased awareness of deficits Awareness: Emergent Problem Solving: Slow processing General Comments: increased processing time, per pt's wife pt appears altered after receiving pain medications      Exercises General Exercises - Lower  Extremity Ankle Circles/Pumps: AROM;Both;20 reps;Seated Long Arc Quad: Both;Seated;15 reps;AAROM Hip ABduction/ADduction: AROM;Both;10 reps;Seated    General  Comments General comments (skin integrity, edema, etc.): HR 81 bpm post-ambulation, brief donned during session for urinary incontinence      Pertinent Vitals/Pain Pain Assessment: Faces Faces Pain Scale: Hurts little more Pain Location: rectum, abdomen Pain Descriptors / Indicators: Grimacing;Guarding Pain Intervention(s): Limited activity within patient's tolerance;Monitored during session;Repositioned    Home Living                      Prior Function            PT Goals (current goals can now be found in the care plan section) Acute Rehab PT Goals Patient Stated Goal: to get stronger, clear the infection, get as many services at home as they can PT Goal Formulation: With patient/family Time For Goal Achievement: 06/03/20 Potential to Achieve Goals: Good Progress towards PT goals: Progressing toward goals    Frequency    Min 3X/week      PT Plan Current plan remains appropriate    Co-evaluation              AM-PAC PT "6 Clicks" Mobility   Outcome Measure  Help needed turning from your back to your side while in a flat bed without using bedrails?: A Little Help needed moving from lying on your back to sitting on the side of a flat bed without using bedrails?: A Little Help needed moving to and from a bed to a chair (including a wheelchair)?: A Little Help needed standing up from a chair using your arms (e.g., wheelchair or bedside chair)?: A Little Help needed to walk in hospital room?: A Little Help needed climbing 3-5 steps with a railing? : A Little 6 Click Score: 18    End of Session Equipment Utilized During Treatment: Gait belt Activity Tolerance: Patient limited by fatigue Patient left: in chair;with call bell/phone within reach;with family/visitor present Nurse Communication: Mobility status PT Visit Diagnosis: Muscle weakness (generalized) (M62.81);Difficulty in walking, not elsewhere classified (R26.2);Pain Pain - Right/Left:  (rectum,  abdomen) Pain - part of body:  (rectum, abdomen)     Time: 4097-3532 PT Time Calculation (min) (ACUTE ONLY): 20 min  Charges:  $Gait Training: 8-22 mins                     Stacie Glaze, PT DPT Acute Rehabilitation Services Pager [phone]  Office [phone]   [place] 05/21/2020, 4:52 PM

## 2020-05-22 DIAGNOSIS — K5909 Other constipation: Secondary | ICD-10-CM | POA: Diagnosis not present

## 2020-05-22 DIAGNOSIS — N1831 Chronic kidney disease, stage 3a: Secondary | ICD-10-CM | POA: Diagnosis not present

## 2020-05-22 DIAGNOSIS — K611 Rectal abscess: Secondary | ICD-10-CM | POA: Diagnosis not present

## 2020-05-22 DIAGNOSIS — I4819 Other persistent atrial fibrillation: Secondary | ICD-10-CM | POA: Diagnosis not present

## 2020-05-22 DIAGNOSIS — I5042 Chronic combined systolic (congestive) and diastolic (congestive) heart failure: Secondary | ICD-10-CM

## 2020-05-22 DIAGNOSIS — I110 Hypertensive heart disease with heart failure: Secondary | ICD-10-CM

## 2020-05-22 LAB — BASIC METABOLIC PANEL
Anion gap: 8 (ref 5–15)
BUN: 23 mg/dL (ref 8–23)
CO2: 23 mmol/L (ref 22–32)
Calcium: 9.2 mg/dL (ref 8.9–10.3)
Chloride: 102 mmol/L (ref 98–111)
Creatinine, Ser: 1.07 mg/dL (ref 0.61–1.24)
GFR, Estimated: >60 mL/min (ref >60)
Glucose, Bld: 117 mg/dL — ABNORMAL HIGH (ref 70–99)
Potassium: 4.2 mmol/L (ref 3.5–5.1)
Sodium: 133 mmol/L — ABNORMAL LOW (ref 135–145)

## 2020-05-22 LAB — CBC
HCT: 35.2 % — ABNORMAL LOW (ref 39.0–52.0)
Hemoglobin: 11.1 g/dL — ABNORMAL LOW (ref 13.0–17.0)
MCH: 25.3 pg — ABNORMAL LOW (ref 26.0–34.0)
MCHC: 31.5 g/dL (ref 30.0–36.0)
MCV: 80.2 fL (ref 80.0–100.0)
Platelets: 343 10*3/uL (ref 150–400)
RBC: 4.39 MIL/uL (ref 4.22–5.81)
RDW: 18.6 % — ABNORMAL HIGH (ref 11.5–15.5)
WBC: 10.7 10*3/uL — ABNORMAL HIGH (ref 4.0–10.5)
nRBC: 0 % (ref 0.0–0.2)

## 2020-05-22 LAB — PROTIME-INR
INR: 1.6 — ABNORMAL HIGH (ref 0.8–1.2)
Prothrombin Time: 19.1 seconds — ABNORMAL HIGH (ref 11.4–15.2)

## 2020-05-22 MED ORDER — SORBITOL 70 % SOLN
30.0000 mL | Freq: Once | Status: AC
  Administered 2020-05-22: 30 mL via ORAL
  Filled 2020-05-22: qty 30

## 2020-05-22 MED ORDER — WARFARIN SODIUM 4 MG PO TABS
4.0000 mg | ORAL_TABLET | Freq: Once | ORAL | Status: AC
  Administered 2020-05-22: 4 mg via ORAL
  Filled 2020-05-22: qty 1

## 2020-05-22 MED ORDER — PANTOPRAZOLE SODIUM 40 MG PO TBEC
40.0000 mg | DELAYED_RELEASE_TABLET | Freq: Every day | ORAL | Status: DC
  Administered 2020-05-25 – 2020-05-28 (×4): 40 mg via ORAL
  Filled 2020-05-22 (×6): qty 1

## 2020-05-22 MED ORDER — SODIUM CHLORIDE 0.9 % IV SOLN
1.0000 g | Freq: Three times a day (TID) | INTRAVENOUS | Status: AC
  Administered 2020-05-22 – 2020-05-26 (×13): 1 g via INTRAVENOUS
  Filled 2020-05-22: qty 1
  Filled 2020-05-22: qty 0.17
  Filled 2020-05-22: qty 1
  Filled 2020-05-22: qty 0.17
  Filled 2020-05-22 (×9): qty 1

## 2020-05-22 NOTE — Progress Notes (Signed)
PROGRESS NOTE    [name]  HQI:[id number] DOB: 1932/01/06 DOA: 05/15/2020 PCP: Shirline Frees, MD    Chief Complaint  Patient presents with  . 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 Also found to have 6g drop in hgb with heme pos stools on presentation. Patient also noted to have a renal abscess status post drain placement with cultures pending.   Assessment & Plan:   Principal Problem:   Perirectal abscess Active Problems:   Hypertensive heart disease   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)   Gastro-esophageal reflux disease without esophagitis   Long term (current) use of anticoagulants   Persistent atrial fibrillation (HCC)   CKD (chronic kidney disease), stage III (HCC)   S/P TAVR (transcatheter aortic valve replacement)   OSA on CPAP   CAD (coronary artery disease), native coronary artery   Atrial fibrillation, persistent (HCC)   Pyelonephritis of right kidney   Presence of IVC filter   Constipation, chronic   Cysts of right kidney   Renal abscess  1 perirectal abscess secondary to ESBL E. coli status post I&D 4/10 -Noted on CT abdomen and pelvis. -General surgery consulted and patient underwent incision and drainage(4/10) per general surgery. -Cultures from incision and drainage with abundant ESBL E. coli sensitive to Zosyn, Cipro, gentamicin, imipenem, Bactrim and resistant to Unasyn, ampicillin, cefazolin, ceftazidime, Rocephin with intermediate sensitivity to cefepime. -Due to concurrent renal abscess ID consulted and IV antibiotics changed to IV Merrem.   -Continue sitz bath's as  recommended per general surgery.   -General surgery was following but have signed off.   -Outpatient follow-up with general surgery.   2.  Right renal subcapsular fluid collection/renal abscess/?  Persistent pyelonephritis -Patient noted to have a right renal subcapsular fluid collection/renal abscess, -Patient noted to have had a UTI approximately a month ago placed on Bactrim later reevaluated in the ED placed on cefpodoxime.  Urine cultures with no growth. -Cultures from this admission negative. -Unable to perform MRI secondary to pacemaker. -Dr Wyline Copas discussed with urology who recommended drainage per IR. -Status post drain placement per IR with purulent fluid noted and cultures positive for ESBL E. coli.   -Per IR once output from drainage < 10cc, will need repeat CT scan. -Continue IV Merrem for ESBL per ID recommendations. -ID following and appreciate input and recommendations.  3.  Anemia/melena -Concern for acute GI bleed in the setting of Coumadin use. -Patient noted to be followed closely at [facility] for GI for history of hemorrhoids in the past. -Hemoglobin noted to have gone down to 9.7 from 16.2 (04/27/2020). -Hemoglobin currently stable at 11.1 -Patient with no overt bleeding. -Stools noted to be heme positive. -Dr. Wyline Copas discussed with GI on-call, and given recent surgical drainage of perirectal abscess recommendation is to hold off on inpatient endoscopy for now and follow-up in the outpatient setting when more stable. -Coumadin has been resumed.  Monitor closely.  4.  History of persistent atrial fibrillation - CHA2DSVASC score 5 -Coumadin was held initially during the hospitalization, due to concerns for anemia/melena.   -Coumadin resumed.   -H&H stable.   -Follow.  5.  History of recurrent DVT/PE with supratherapeutic INR -Patient noted to  have a history of provoked DVT following prostatectomy status post IVC placement afterwards. -Patient noted to have developed  bacteremia and PE and DVT and subsequently placed on warfarin. -Patient's PE and DVT noted to be remote. -Coumadin held initially due to concern for dark stools overnight.   -Coumadin resumed.   -Hemoglobin stable at 11.1.    6.  History of heart block status post PPM -Stable.  Outpatient follow-up with cardiology.   7.  History of aortic stenosis status post TAVR -Appears to be a bioprosthetic valve. -Outpatient follow-up.  8.  Coronary artery disease status post stent -Continue home regimen Lasix, Imdur, Pravachol, Norvasc.   9.  OSA Patient noted to be refusing CPAP at bedtime and has been changed to as needed.  10.  Chronic kidney disease stage IIIa - Stable.  11.  Borderline blood pressure -Patient noted to have borderline blood pressure 05/21/2020.   -Lasix and Norvasc held with improvement with blood pressure.   -Continue Imdur.  12.  Constipation Continue MiraLAX twice daily. -Sorbitol p.o. x1.    DVT prophylaxis: SCDs Code Status: Full Family Communication: Updated patient and wife at bedside. Disposition:   Status is: Inpatient    Dispo: The patient is from: Home              Anticipated d/c is to: Home with home health.               Patient currently with a perirectal abscess status post incision and drainage with cultures of ESBL, renal abscess status post drain placement per IR cultures positive for ESBL, on IV antibiotics, ID following.  Not stable for discharge.   Difficult to place patient no       Consultants:   General surgery: Dr. Donne Hazel 05/16/2020  Interventional radiology: Dr. Laurence Ferrari 05/17/2020  Infectious disease: Dr. Juleen China 05/19/2020  Procedures:   CT abdomen and pelvis 05/15/2020  Placement of 12 French percutaneous drainage catheter with aspiration of 135 mL of frankly pleural fluid from subscapular perinephric fluid collection-Per IR, Dr. Laurence Ferrari 05/18/2020  Incision and drainage of perirectal abscess per general  surgery, Dr. Donne Hazel 05/16/2020  Antimicrobials:   IV Merrem 05/19/2020>>>>  IV Zosyn 05/15/2020 >>>> 05/19/2020    Subjective: Patient sleeping but arousable.  Per wife patient with complaints of constipation has not had a bowel movement in about 3 to 4 days.  Patient with some complaints of some abdominal discomfort.  No chest pain.  No shortness of breath.  No bleeding.  Rectal pain improving with sitz bath's.   Objective: Vitals:   05/21/20 0845 05/21/20 1416 05/21/20 2209 05/22/20 0553  BP: (!) 148/84 (!) 150/78 (!) 157/71 (!) 143/56  Pulse: 70 68 65 60  Resp: 18 18 20 20   Temp: 97.6 F (36.4 C) 98.9 F (37.2 C) 98.3 F (36.8 C) 97.7 F (36.5 C)  TempSrc: Oral Oral Oral Oral  SpO2: 97% 98% 96% 95%  Weight:      Height:        Intake/Output Summary (Last 24 hours) at 05/22/2020 1151 Last data filed at 05/22/2020 1007 Gross per 24 hour  Intake 1323.75 ml  Output 805 ml  Net 518.75 ml   Filed Weights   05/15/20 1923  Weight: 97.1 kg    Examination:  General exam: : NAD Respiratory system: Lungs clear to auscultation bilaterally anterior lung fields.  No wheezes, no crackles, no rhonchi.  Normal respiratory effort.  Cardiovascular system: RRR no murmurs rubs or gallops.  No JVD.  No lower extremity edema.   Gastrointestinal system: Abdomen is soft, some diffuse tenderness to palpation, positive bowel sounds.  No rebound.  No guarding.  JP drain with some purulent drainage noted.  Central nervous system: Alert and oriented. No focal neurological deficits. Extremities: Symmetric 5 x 5 power. Skin: No rashes, lesions or ulcers Psychiatry: Judgement and insight appear normal. Mood & affect appropriate..     Data Reviewed: I have personally reviewed following labs and imaging studies  CBC: Recent Labs  Lab 05/15/20 2000 05/16/20 0611 05/18/20 0449 05/19/20 0515 05/20/20 0517 05/21/20 0517 05/22/20 0512  WBC 11.8*   < > 10.5 13.6* 11.0* 10.0 10.7*  NEUTROABS  8.9*  --   --   --   --   --   --   HGB 10.4*   < > 9.8* 10.7* 10.6* 10.8* 11.1*  HCT 32.8*   < > 31.0* 34.5* 33.5* 34.4* 35.2*  MCV 80.4   < > 80.7 81.4 80.1 80.8 80.2  PLT 283   < > 306 326 361 315 343   < > = values in this interval not displayed.    Basic Metabolic Panel: Recent Labs  Lab 05/16/20 0611 05/17/20 0659 05/18/20 0449 05/19/20 0515 05/20/20 0517 05/21/20 0517 05/22/20 0512  NA 136   < > 136 137 136 137 133*  K 4.7   < > 4.5 4.8 3.9 4.2 4.2  CL 103   < > 104 104 102 106 102  CO2 26   < > 24 26 27 25 23   GLUCOSE 99   < > 124* 135* 130* 119* 117*  BUN 24*   < > 22 20 20 22 23   CREATININE 1.29*   < > 1.22 1.40* 1.38* 1.38* 1.07  CALCIUM 8.9   < > 9.4 9.3 9.4 9.2 9.2  MG  --   --   --   --  1.9 2.0  --   PHOS 3.4  --   --   --   --   --   --    < > = values in this interval not displayed.    GFR: Estimated Creatinine Clearance: 58.8 mL/min (by C-G formula based on SCr of 1.07 mg/dL).  Liver Function Tests: Recent Labs  Lab 05/17/20 0659 05/18/20 0449 05/19/20 0515  AST 16 20 16   ALT 9 9 8   ALKPHOS 71 69 68  BILITOT 0.9 0.8 1.1  PROT 5.8* 5.8* 6.0*  ALBUMIN 2.3* 2.2* 2.4*    CBG: No results for input(s): GLUCAP in the last 168 hours.   Recent Results (from the past 240 hour(s))  Resp Panel by RT-PCR (Flu A&B, Covid) Nasopharyngeal Swab     Status: None   Collection Time: 05/16/20  8:06 AM   Specimen: Nasopharyngeal Swab; Nasopharyngeal(NP) swabs in vial transport medium  Result Value Ref Range Status   SARS Coronavirus 2 by RT PCR NEGATIVE NEGATIVE Final    Comment: (NOTE) SARS-CoV-2 target nucleic acids are NOT DETECTED.  The SARS-CoV-2 RNA is generally detectable in upper respiratory specimens during the acute phase of infection. The lowest concentration of SARS-CoV-2 viral copies this assay can detect is 138 copies/mL. A negative result does not preclude SARS-Cov-2 infection and should not be used as the sole basis for treatment or other  patient management decisions. A negative result may occur with  improper specimen collection/handling, submission of specimen other than nasopharyngeal swab, presence of viral mutation(s) within the areas targeted by this assay, and inadequate  number of viral copies(<138 copies/mL). A negative result must be combined with clinical observations, patient history, and epidemiological information. The expected result is Negative.  Fact Sheet for Patients:  EntrepreneurPulse.com.au  Fact Sheet for Healthcare Providers:  IncredibleEmployment.be  This test is no t yet approved or cleared by the [place] FDA and  has been authorized for detection and/or diagnosis of SARS-CoV-2 by FDA under an Emergency Use Authorization (EUA). This EUA will remain  in effect (meaning this test can be used) for the duration of the COVID-19 declaration under Section 564(b)(1) of the Act, 21 U.S.C.section 360bbb-3(b)(1), unless the authorization is terminated  or revoked sooner.       Influenza A by PCR NEGATIVE NEGATIVE Final   Influenza B by PCR NEGATIVE NEGATIVE Final    Comment: (NOTE) The Xpert Xpress SARS-CoV-2/FLU/RSV plus assay is intended as an aid in the diagnosis of influenza from Nasopharyngeal swab specimens and should not be used as a sole basis for treatment. Nasal washings and aspirates are unacceptable for Xpert Xpress SARS-CoV-2/FLU/RSV testing.  Fact Sheet for Patients: EntrepreneurPulse.com.au  Fact Sheet for Healthcare Providers: IncredibleEmployment.be  This test is not yet approved or cleared by the [place] FDA and has been authorized for detection and/or diagnosis of SARS-CoV-2 by FDA under an Emergency Use Authorization (EUA). This EUA will remain in effect (meaning this test can be used) for the duration of the COVID-19 declaration under Section 564(b)(1) of the Act, 21 U.S.C. section  360bbb-3(b)(1), unless the authorization is terminated or revoked.  Performed at [facility], [place] [address] [place], [place] [address]   Aerobic/Anaerobic Culture w Gram Stain (surgical/deep wound)     Status: None   Collection Time: 05/16/20 11:22 AM   Specimen: PATH Other; Tissue  Result Value Ref Range Status   Specimen Description   Final    TISSUE Performed at [place] [address] [place], [place] [address]    Special Requests   Final    NONE Performed at [facility], [place] [address] [place], [place] [address]    Gram Stain   Final    MODERATE WBC PRESENT, PREDOMINANTLY PMN FEW GRAM POSITIVE COCCI IN PAIRS MODERATE GRAM NEGATIVE RODS    Culture   Final    ABUNDANT ESCHERICHIA COLI Confirmed Extended Spectrum Beta-Lactamase Producer (ESBL).  In bloodstream infections from ESBL organisms, carbapenems are preferred over piperacillin/tazobactam. They are shown to have a lower risk of mortality. NO ANAEROBES ISOLATED Performed at [facility], [place] [address], [place], [place] [address]    Report Status 05/21/2020 FINAL  Final   Organism ID, Bacteria ESCHERICHIA COLI  Final      Susceptibility   Escherichia coli - MIC*    AMPICILLIN >=32 RESISTANT Resistant     CEFAZOLIN >=64 RESISTANT Resistant     CEFEPIME 8 INTERMEDIATE Intermediate     CEFTAZIDIME RESISTANT Resistant     CEFTRIAXONE >=64 RESISTANT Resistant     CIPROFLOXACIN <=0.25 SENSITIVE Sensitive     GENTAMICIN <=1 SENSITIVE Sensitive     IMIPENEM <=0.25 SENSITIVE Sensitive     TRIMETH/SULFA <=20 SENSITIVE Sensitive     AMPICILLIN/SULBACTAM >=32 RESISTANT Resistant     PIP/TAZO 8 SENSITIVE Sensitive     * ABUNDANT ESCHERICHIA COLI  Aerobic/Anaerobic Culture (surgical/deep wound)     Status: None (Preliminary result)   Collection Time: 05/18/20 12:01 PM   Specimen: Abscess  Result Value Ref Range Status   Specimen Description  Final     ABSCESS RT KIDNEY Performed at [facility], [place] [address], [place], [place] [address]    Special Requests   Final    Normal Performed at [facility], [place] [address] [place], [place] [address]    Gram Stain   Final    ABUNDANT WBC PRESENT,BOTH PMN AND MONONUCLEAR FEW GRAM NEGATIVE RODS Performed at [facility], [place] [address], [place], [place] [address]    Culture   Final    ABUNDANT ESCHERICHIA COLI Confirmed Extended Spectrum Beta-Lactamase Producer (ESBL).  In bloodstream infections from ESBL organisms, carbapenems are preferred over piperacillin/tazobactam. They are shown to have a lower risk of mortality. NO ANAEROBES ISOLATED; CULTURE IN PROGRESS FOR 5 DAYS    Report Status PENDING  Incomplete   Organism ID, Bacteria ESCHERICHIA COLI  Final      Susceptibility   Escherichia coli - MIC*    AMPICILLIN >=32 RESISTANT Resistant     CEFAZOLIN >=64 RESISTANT Resistant     CEFEPIME 8 INTERMEDIATE Intermediate     CEFTAZIDIME RESISTANT Resistant     CEFTRIAXONE >=64 RESISTANT Resistant     CIPROFLOXACIN <=0.25 SENSITIVE Sensitive     GENTAMICIN <=1 SENSITIVE Sensitive     IMIPENEM <=0.25 SENSITIVE Sensitive     TRIMETH/SULFA <=20 SENSITIVE Sensitive     AMPICILLIN/SULBACTAM >=32 RESISTANT Resistant     PIP/TAZO 8 SENSITIVE Sensitive     * ABUNDANT ESCHERICHIA COLI         Radiology Studies: No results found.      Scheduled Meds: . feeding supplement  237 mL Oral BID BM  . isosorbide mononitrate  15 mg Oral Daily  . polyethylene glycol  34 g Oral BID  . pravastatin  20 mg Oral Daily  . sodium chloride flush  5 mL Intracatheter Q8H  . sorbitol  30 mL Oral Once  . vitamin B-12  500 mcg Oral Daily  . warfarin  4 mg Oral ONCE-1600  . Warfarin - Pharmacist Dosing Inpatient   Does not apply q1600   Continuous Infusions: . sodium chloride 10 mL/hr at 05/16/20 0401  . meropenem (MERREM) IV       LOS: 6 days    Time  spent: 35 minutes    Irine Seal, MD [facility]   To contact the attending provider between 7A-7P or the covering provider during after hours 7P-7A, please log into the web site [email or web address] and access using universal [place] password for that web site. If you do not have the password, please call the hospital operator.  05/22/2020, 11:51 AM

## 2020-05-22 NOTE — Progress Notes (Signed)
ANTICOAGULATION CONSULT NOTE - Initial Consult  Pharmacy Consult for Warfarin Indication: history of DVT/PE, atrial fibrillation   Allergies  Allergen Reactions  . Darifenacin Nausea Only and Other (See Comments)    dry mouth and dizziness ENABLEX Dizziness Pt denies  . Codeine Other (See Comments)  . Sulfamethoxazole-Trimethoprim Other (See Comments)  . Lisinopril Cough    Pt denies    Patient Measurements: Height: 6' (182.9 cm) Weight: 97.1 kg (214 lb) IBW/kg (Calculated) : 77.6  Labs: Recent Labs    05/20/20 0517 05/21/20 0517 05/22/20 0512  HGB 10.6* 10.8* 11.1*  HCT 33.5* 34.4* 35.2*  PLT 361 315 343  LABPROT 18.0* 17.7* 19.1*  INR 1.5* 1.5* 1.6*  CREATININE 1.38* 1.38* 1.07    Estimated Creatinine Clearance: 58.8 mL/min (by C-G formula based on SCr of 1.07 mg/dL).    Medications:  Home warfarin regimen: 2.5mg  PO daily-last dose reported 05/14/2020 at 2100.  INR supratherapeutic at 3.3.   Assessment: 64 y/oM with PMH of prostate CA, aortic stenosis s/p TAVR, atrial fibrillation, history of recurrent  (remote) DVT/PE s/p IVC filter on warfarin who presented with rectal bleeding, supratherapeutic INR, and anemia.  Significant events:  4/9 PM: Vitamin K 2.5mg  PO x 1  4/10: INR 3.4 in AM; Urgent I&D of perirectal abscess, given KCentra and Vitamin K 10mg  IV x 1 pre-procedure. Postprocedure INR 1.4 and Warfarin resumed in PM  4/11: warfarin held for drainage of R subcapsular renal fluid collection  4/14: resuming warfarin  Today, 05/22/2020:  INR 1.6, remains subtherapeutic but slightly increasing slowly after 2 doses resumed warfarin  Hgb low but improved to 11.1; Plt stable WNL  No further rectal bleeding noted or procedures planned  Meal intake not reported.  No major DDIs with warfarin noted  Goal of Therapy:  INR 2-3 Monitor platelets by anticoagulation protocol: Yes   Plan:   Warfarin 4 mg PO x 1 today  Daily PT/INR, CBC  Monitor for  s/sx of bleeding   Gretta Arab PharmD, BCPS Clinical Pharmacist [facility] main pharmacy [phone] 05/22/2020 9:38 AM

## 2020-05-22 NOTE — Evaluation (Signed)
Occupational Therapy Evaluation Patient Details Name: [name] MRN: [id number] DOB: 1931-09-01 Today's Date: 05/22/2020    History of Present Illness 87 y.o. male admitted on 05/15/20 who presents with concerns of rectal pain, found to have perirectal abscess, s/p I &D 05/16/20 and s/p 23F drain R renal subcapsular fluid collection 05/18/20.Marland Kitchen Also found to have 6g drop in hgb with heme pos stools, suspect GIB. 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   Clinical Impression   Pt lives with his supportive wife who reports he has had gradual weakening, requiring use of a rollator with her help. He showers in sitting with supervision and is assisted for dressing and IADL. Pt's wife reports he is highly motivated to be as independent as possible. Pt presents with fatigue this date, but is comfortable. He is likely to progress well with acute OT and return home. Will follow.     Follow Up Recommendations  Home health OT;Supervision/Assistance - 24 hour    Equipment Recommendations  None recommended by OT    Recommendations for Other Services       Precautions / Restrictions Precautions Precautions: Fall;Other (comment) Precaution Comments: urinary incontinence, primofit in place Restrictions Weight Bearing Restrictions: No      Mobility Bed Mobility               General bed mobility comments: NT    Transfers                      Balance                                           ADL either performed or assessed with clinical judgement   ADL Overall ADL's : Needs assistance/impaired Eating/Feeding: Set up;Sitting Eating/Feeding Details (indicate cue type and reason): assist to cut food, set up  tray Grooming: Maximal assistance Grooming Details (indicate cue type and reason): wife assisted with clipping his facial hair Upper Body Bathing: Minimal assistance;Sitting   Lower Body Bathing: Total assistance;Sit to/from stand   Upper Body Dressing : Minimal assistance;Sitting   Lower Body Dressing: Total assistance;Sit to/from stand   Toilet Transfer: Minimal assistance;Stand-pivot;BSC;RW   Toileting- Clothing Manipulation and Hygiene: Total assistance;Sit to/from stand               Vision Patient Visual Report: No change from baseline       Perception     Praxis      Pertinent Vitals/Pain Pain Assessment: No/denies pain     Hand Dominance Right   Extremity/Trunk Assessment Upper Extremity Assessment Upper Extremity Assessment: Generalized weakness   Lower Extremity Assessment Lower Extremity Assessment: Defer to PT evaluation   Cervical / Trunk Assessment Cervical / Trunk Assessment: Normal   Communication Communication Communication: HOH (with hearing aids)   Cognition Arousal/Alertness: Lethargic Behavior During Therapy: WFL for tasks assessed/performed Overall Cognitive Status: Difficult to assess                                     General Comments       Exercises  Shoulder Instructions      Home Living Family/patient expects to be discharged to:: Private residence Living Arrangements: Spouse/significant other Available Help at Discharge: Family;Available 24 hours/day Type of Home: House Home Access: Stairs to enter [employer] of Steps: 1 Entrance Stairs-Rails: None Home Layout: One level     Bathroom Shower/Tub: Occupational psychologist: Handicapped height     Home Equipment: Walker - 4 wheels;Grab bars - tub/shower;Grab bars - toilet;Shower seat - built in   Additional Comments: wife has taken FMLA to be home to care for him and potentially will retire      Prior  Functioning/Environment Level of Independence: Needs assistance  Gait / Transfers Assistance Needed: recently has been getting weaker (2 weeks PTA) needing assistance to walk with rollator, but before that, was mod I with rollator ADL's / Homemaking Assistance Needed: showers in sitting with wife assisting with transfers, supervising and drying pt, pt is fatigued after showering, wife helps put his clothes on            OT Problem List: Decreased strength;Decreased activity tolerance;Impaired balance (sitting and/or standing);Decreased cognition;Pain;Decreased knowledge of use of DME or AE      OT Treatment/Interventions: Self-care/ADL training;DME and/or AE instruction;Patient/family education;Balance training;Therapeutic activities    OT Goals(Current goals can be found in the care plan section) Acute Rehab OT Goals Patient Stated Goal: to get stronger, clear the infection, get as many services at home as they can OT Goal Formulation: With family Time For Goal Achievement: 06/05/20 Potential to Achieve Goals: Good ADL Goals Pt Will Perform Grooming: with min guard assist;standing Pt Will Perform Upper Body Bathing: with supervision;sitting Pt Will Perform Upper Body Dressing: with supervision;sitting Pt Will Transfer to Toilet: with min guard assist;ambulating;bedside commode Pt Will Perform Toileting - Clothing Manipulation and hygiene: with min assist;sit to/from stand Additional ADL Goal #1: Pt will perform bed mobility with min assist in preparation for ADL.  OT Frequency: Min 2X/week   Barriers to D/C:            Co-evaluation              AM-PAC OT "6 Clicks" Daily Activity     Outcome Measure Help from another person eating meals?: A Little Help from another person taking care of personal grooming?: A Lot Help from another person toileting, which includes using toliet, bedpan, or urinal?: Total Help from another person bathing (including washing, rinsing,  drying)?: A Lot Help from another person to put on and taking off regular upper body clothing?: A Little Help from another person to put on and taking off regular lower body clothing?: Total 6 Click Score: 12   End of Session    Activity Tolerance: Patient limited by fatigue Patient left: in bed;with call bell/phone within reach;with family/visitor present  OT Visit Diagnosis: Other abnormalities of gait and mobility (R26.89);Unsteadiness on feet (R26.81);Muscle weakness (generalized) (M62.81);Other symptoms and signs involving cognitive function                Time: 1287-8676 OT Time Calculation (min): 31 min Charges:  OT General Charges $OT Visit: 1 Visit OT Evaluation $OT Eval Moderate Complexity: 1 Mod  Nestor Lewandowsky, OTR/L Acute Rehabilitation Services Pager: [phone] Office: [phone]  Malka So 05/22/2020, 2:34 PM

## 2020-05-22 NOTE — Progress Notes (Signed)
Drainage is thick, cloudy, brown with redness and swelling to right flank, hot to touch. Patient c/o pain when assessing site. Patient refused dressing change due to pain. Will update MD.

## 2020-05-22 NOTE — Progress Notes (Signed)
PHARMACY NOTE:  ANTIMICROBIAL RENAL DOSAGE ADJUSTMENT  Current antimicrobial regimen includes a mismatch between antimicrobial dosage and estimated renal function.  As per policy approved by the Pharmacy & Therapeutics and Medical Executive Committees, the antimicrobial dosage will be adjusted accordingly.  Current antimicrobial dosage:  Meropenem  Indication: Perirectal abscess s/p I&D 4/10; pyelonephritis,  right renal subscapular abscess s/p IR drain placement 4/12 with ESBL E. coli   Renal Function:  SCr improved 1.38 > 1.07 Estimated Creatinine Clearance: 58.8 mL/min (by C-G formula based on SCr of 1.07 mg/dL).    Antimicrobial dosage has been changed to:  Meropenem 1g IV q8h    Thank you for allowing pharmacy to be a part of this patient's care.  Gretta Arab PharmD, BCPS Clinical Pharmacist [facility] main pharmacy [phone] 05/22/2020 9:58 AM

## 2020-05-23 DIAGNOSIS — K5909 Other constipation: Secondary | ICD-10-CM | POA: Diagnosis not present

## 2020-05-23 DIAGNOSIS — N1831 Chronic kidney disease, stage 3a: Secondary | ICD-10-CM | POA: Diagnosis not present

## 2020-05-23 DIAGNOSIS — I4819 Other persistent atrial fibrillation: Secondary | ICD-10-CM | POA: Diagnosis not present

## 2020-05-23 DIAGNOSIS — K611 Rectal abscess: Secondary | ICD-10-CM | POA: Diagnosis not present

## 2020-05-23 LAB — CBC
HCT: 34 % — ABNORMAL LOW (ref 39.0–52.0)
Hemoglobin: 10.8 g/dL — ABNORMAL LOW (ref 13.0–17.0)
MCH: 25.5 pg — ABNORMAL LOW (ref 26.0–34.0)
MCHC: 31.8 g/dL (ref 30.0–36.0)
MCV: 80.2 fL (ref 80.0–100.0)
Platelets: 351 10*3/uL (ref 150–400)
RBC: 4.24 MIL/uL (ref 4.22–5.81)
RDW: 18.6 % — ABNORMAL HIGH (ref 11.5–15.5)
WBC: 10.2 10*3/uL (ref 4.0–10.5)
nRBC: 0 % (ref 0.0–0.2)

## 2020-05-23 LAB — BASIC METABOLIC PANEL
Anion gap: 8 (ref 5–15)
BUN: 21 mg/dL (ref 8–23)
CO2: 24 mmol/L (ref 22–32)
Calcium: 9.5 mg/dL (ref 8.9–10.3)
Chloride: 102 mmol/L (ref 98–111)
Creatinine, Ser: 1.11 mg/dL (ref 0.61–1.24)
GFR, Estimated: >60 mL/min (ref >60)
Glucose, Bld: 122 mg/dL — ABNORMAL HIGH (ref 70–99)
Potassium: 4.6 mmol/L (ref 3.5–5.1)
Sodium: 134 mmol/L — ABNORMAL LOW (ref 135–145)

## 2020-05-23 LAB — AEROBIC/ANAEROBIC CULTURE W GRAM STAIN (SURGICAL/DEEP WOUND): Special Requests: NORMAL

## 2020-05-23 LAB — AMMONIA: Ammonia: 18 umol/L (ref 9–35)

## 2020-05-23 LAB — PROTIME-INR
INR: 1.9 — ABNORMAL HIGH (ref 0.8–1.2)
Prothrombin Time: 22 seconds — ABNORMAL HIGH (ref 11.4–15.2)

## 2020-05-23 MED ORDER — HYDROCODONE-ACETAMINOPHEN 5-325 MG PO TABS
1.0000 | ORAL_TABLET | ORAL | Status: DC | PRN
  Administered 2020-05-23 – 2020-05-26 (×4): 1 via ORAL
  Filled 2020-05-23 (×5): qty 1

## 2020-05-23 MED ORDER — WARFARIN SODIUM 2.5 MG PO TABS
2.5000 mg | ORAL_TABLET | Freq: Once | ORAL | Status: AC
  Administered 2020-05-23: 2.5 mg via ORAL
  Filled 2020-05-23: qty 1

## 2020-05-23 NOTE — Progress Notes (Signed)
Upon arriving to work, Probation officer was approached by pt's spouse, who was very tearful and upset about her husband's condition. She stated that she would like to know more about her husband's condition and she feels like she may not know everything she needs to know. She stated that it is the patient's wish, as well as her's, that "if he is not getting better, then they would like to know that so that they could just stop all this". She feels that pt is not getting better and wants to speak with someone about his outlook. Writer has made MD aware.  Writer has also tried to contact IR to take a look at the "reported" condition around the pt's right flank drain. The technologist gave writer the PA's number and a message was left for the PA to assess when available. The spouse is adamant that the drain site be assessed at one time by all staff, as the pt screams out in pain when the dressing around the site is removed. Pt is now asleep, after PRN Vicodin was given this morning.

## 2020-05-23 NOTE — Progress Notes (Signed)
At this time, writer has assessed the right flank drain site. Pt is now up and to [facility] receiving sitz bath with nurse and NT present. The drain site looks fine with minimal redness where the tape was removed from around the drain. Pt awake and alert to surroundings. Spouse present in room. Have alerted MD.  Small amount of purulent drainage, yellow and thick, noted in bulb.

## 2020-05-23 NOTE — Progress Notes (Signed)
PROGRESS NOTE    [name]  WVP:[id number] DOB: 02/14/1931 DOA: 05/15/2020 PCP: Shirline Frees, MD    Chief Complaint  Patient presents with  . 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 Also found to have 6g drop in hgb with heme pos stools on presentation. Patient also noted to have a renal abscess status post drain placement with cultures pending.   Assessment & Plan:   Principal Problem:   Perirectal abscess Active Problems:   Hypertensive heart disease   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)   Gastro-esophageal reflux disease without esophagitis   Long term (current) use of anticoagulants   Persistent atrial fibrillation (HCC)   CKD (chronic kidney disease), stage III (HCC)   S/P TAVR (transcatheter aortic valve replacement)   OSA on CPAP   CAD (coronary artery disease), native coronary artery   Atrial fibrillation, persistent (HCC)   Pyelonephritis of right kidney   Presence of IVC filter   Constipation, chronic   Cysts of right kidney   Renal abscess  1 perirectal abscess secondary to ESBL E. coli status post I&D 4/10 -Noted on CT abdomen and pelvis. -General surgery consulted and patient underwent incision and drainage(4/10) per general surgery. -Cultures from incision and drainage with abundant ESBL E. coli sensitive to Zosyn, Cipro, gentamicin, imipenem, Bactrim and resistant to Unasyn, ampicillin, cefazolin, ceftazidime, Rocephin with intermediate sensitivity to cefepime. -Due to concurrent renal abscess, ID consulted and IV antibiotics changed to IV Merrem.   -Continue sitz bath's as  recommended per general surgery.   -General surgery was following but have signed off.   -Outpatient follow-up with general surgery.   2.  Right renal subcapsular fluid collection/renal abscess/?  Persistent pyelonephritis -Patient noted to have a right renal subcapsular fluid collection/renal abscess, -Patient noted to have had a UTI approximately a month ago placed on Bactrim later reevaluated in the ED placed on cefpodoxime.  Urine cultures with no growth. -Cultures from this admission negative. -Unable to perform MRI secondary to pacemaker. -Dr Wyline Copas discussed with urology who recommended drainage per IR. -Status post drain placement per IR with purulent fluid noted and cultures positive for ESBL E. coli.   -Per IR once output from drainage < 10cc, will need repeat CT scan. -There was concern yesterday about drain insertion site concerning for infection however assessed insertion site of drain myself which is nonerythematous, no warmth, no significant tenderness to palpation. -Continue IV Merrem for ESBL per ID recommendations. -ID following and appreciate input and recommendations.  3.  Anemia/melena -Concern for acute GI bleed in the setting of Coumadin use. -Patient noted to be followed closely at [facility] for GI for history of hemorrhoids in the past. -Hemoglobin noted to have gone down to 9.7 from 16.2 (04/27/2020). -Hemoglobin stable at 10.8. -Patient with no overt bleeding. -Stools noted to be heme positive. -Dr. Wyline Copas discussed with GI on-call, and given recent surgical drainage of perirectal abscess recommendation is to hold off on inpatient endoscopy for now and follow-up in the outpatient setting when more stable. -Coumadin has been resumed.  Monitor closely.  4.  History of persistent atrial fibrillation - CHA2DSVASC score 5 -Coumadin was held initially during the hospitalization, due to  concerns for anemia/melena.   -Coumadin resumed.   -INR at 1.9.   -Hemoglobin stable at  10.8.   -Coumadin per pharmacy.  5.  History of recurrent DVT/PE with supratherapeutic INR -Patient noted to have a history of provoked DVT following prostatectomy status post IVC placement afterwards. -Patient noted to have developed bacteremia and PE and DVT and subsequently placed on warfarin. -Patient's PE and DVT noted to be remote. -Coumadin held initially due to concern for dark stools overnight.   -Coumadin resumed.   -INR at 1.9.   -Hemoglobin stable at 10.8.   6.  History of heart block status post PPM -Stable.  Outpatient follow-up with cardiology.   7.  History of aortic stenosis status post TAVR -Appears to be a bioprosthetic valve. -Outpatient follow-up.  8.  Coronary artery disease status post stent -Stable.  Continue home regimen Imdur, Pravachol.  Lasix and Norvasc held due to borderline blood pressure.   9.  OSA Patient noted to be refusing CPAP at bedtime and has been changed to as needed.  10.  Chronic kidney disease stage IIIa -Stable.  11.  Borderline blood pressure -Patient noted to have borderline blood pressure 05/21/2020.   -Lasix and Norvasc held with improvement with blood pressure.   -Continue Imdur.  12.  Constipation -Patient stated had significant bowel movement yesterday.   -Continue current bowel regimen of MiraLAX twice daily.     DVT prophylaxis: SCDs Code Status: Full Family Communication: Updated patient and wife at bedside. Disposition:   Status is: Inpatient    Dispo: The patient is from: Home              Anticipated d/c is to: Home with home health.               Patient currently with a perirectal abscess status post incision and drainage with cultures of ESBL, renal abscess status post drain placement per IR cultures positive for ESBL, on IV antibiotics, ID following.  Not stable for discharge.   Difficult to place patient no       Consultants:   General surgery: Dr. Donne Hazel 05/16/2020  Interventional radiology:  Dr. Laurence Ferrari 05/17/2020  Infectious disease: Dr. Juleen China 05/19/2020  Procedures:   CT abdomen and pelvis 05/15/2020  Placement of 12 French percutaneous drainage catheter with aspiration of 135 mL of frankly pleural fluid from subscapular perinephric fluid collection-Per IR, Dr. Laurence Ferrari 05/18/2020  Incision and drainage of perirectal abscess per general surgery, Dr. Donne Hazel 05/16/2020  Antimicrobials:   IV Merrem 05/19/2020>>>>  IV Zosyn 05/15/2020 >>>> 05/19/2020    Subjective: Patient sleeping but arousable.  States had a significant bowel movement yesterday with some improvement with abdominal pain.  No chest pain.  No shortness of breath.  Wife reported to RN she was concerned patient was not improving and she had had a discussion with patient that if he was hospitalized and not improving may need to consider transitioning to a comfort approach.  Objective: Vitals:   05/21/20 2209 05/22/20 0553 05/22/20 1401 05/23/20 0545  BP: (!) 157/71 (!) 143/56 (!) 124/56 137/66  Pulse: 65 60 63 61  Resp: 20 20 20  (!) 22  Temp:  97.7 F (36.5 C) 97.6 F (36.4 C) 97.7 F (36.5 C)  TempSrc: Oral Oral Oral Oral  SpO2: 96% 95% 97% 96%  Weight:      Height:        Intake/Output Summary (Last 24 hours) at 05/23/2020 1140 Last data filed at 05/23/2020 0600 Gross per  24 hour  Intake 987.19 ml  Output 42 ml  Net 945.19 ml   Filed Weights   05/15/20 1923  Weight: 97.1 kg    Examination:  General exam: : NAD Respiratory system: CTA B anterior lung fields.  No wheezes, no crackles, no rhonchi.  Normal respiratory effort. Cardiovascular system: Regular rate rhythm no murmurs rubs or gallops.  No JVD.  No lower extremity edema. Gastrointestinal system: Abdomen is soft, nondistended, nontender to palpation, positive bowel sounds.  No rebound.  No guarding.  JP drain with purulent drainage.  Site around JP drain with no erythema, no warmth, no significant tenderness to palpation.  Pain  around tape site which is pulling on hairs.  Central nervous system: Alert and oriented. No focal neurological deficits. Extremities: Symmetric 5 x 5 power. Skin: No rashes, lesions or ulcers Psychiatry: Judgement and insight appear normal. Mood & affect appropriate..     Data Reviewed: I have personally reviewed following labs and imaging studies  CBC: Recent Labs  Lab 05/19/20 0515 05/20/20 0517 05/21/20 0517 05/22/20 0512 05/23/20 0711  WBC 13.6* 11.0* 10.0 10.7* 10.2  HGB 10.7* 10.6* 10.8* 11.1* 10.8*  HCT 34.5* 33.5* 34.4* 35.2* 34.0*  MCV 81.4 80.1 80.8 80.2 80.2  PLT 326 361 315 343 295    Basic Metabolic Panel: Recent Labs  Lab 05/19/20 0515 05/20/20 0517 05/21/20 0517 05/22/20 0512 05/23/20 0711  NA 137 136 137 133* 134*  K 4.8 3.9 4.2 4.2 4.6  CL 104 102 106 102 102  CO2 26 27 25 23 24   GLUCOSE 135* 130* 119* 117* 122*  BUN 20 20 22 23 21   CREATININE 1.40* 1.38* 1.38* 1.07 1.11  CALCIUM 9.3 9.4 9.2 9.2 9.5  MG  --  1.9 2.0  --   --     GFR: Estimated Creatinine Clearance: 56.6 mL/min (by C-G formula based on SCr of 1.11 mg/dL).  Liver Function Tests: Recent Labs  Lab 05/17/20 0659 05/18/20 0449 05/19/20 0515  AST 16 20 16   ALT 9 9 8   ALKPHOS 71 69 68  BILITOT 0.9 0.8 1.1  PROT 5.8* 5.8* 6.0*  ALBUMIN 2.3* 2.2* 2.4*    CBG: No results for input(s): GLUCAP in the last 168 hours.   Recent Results (from the past 240 hour(s))  Resp Panel by RT-PCR (Flu A&B, Covid) Nasopharyngeal Swab     Status: None   Collection Time: 05/16/20  8:06 AM   Specimen: Nasopharyngeal Swab; Nasopharyngeal(NP) swabs in vial transport medium  Result Value Ref Range Status   SARS Coronavirus 2 by RT PCR NEGATIVE NEGATIVE Final    Comment: (NOTE) SARS-CoV-2 target nucleic acids are NOT DETECTED.  The SARS-CoV-2 RNA is generally detectable in upper respiratory specimens during the acute phase of infection. The lowest concentration of SARS-CoV-2 viral copies this  assay can detect is 138 copies/mL. A negative result does not preclude SARS-Cov-2 infection and should not be used as the sole basis for treatment or other patient management decisions. A negative result may occur with  improper specimen collection/handling, submission of specimen other than nasopharyngeal swab, presence of viral mutation(s) within the areas targeted by this assay, and inadequate number of viral copies(<138 copies/mL). A negative result must be combined with clinical observations, patient history, and epidemiological information. The expected result is Negative.  Fact Sheet for Patients:  EntrepreneurPulse.com.au  Fact Sheet for Healthcare Providers:  IncredibleEmployment.be  This test is no t yet approved or cleared by the [place] and  has been authorized for detection and/or diagnosis of SARS-CoV-2 by FDA under an Emergency Use Authorization (EUA). This EUA will remain  in effect (meaning this test can be used) for the duration of the COVID-19 declaration under Section 564(b)(1) of the Act, 21 U.S.C.section 360bbb-3(b)(1), unless the authorization is terminated  or revoked sooner.       Influenza A by PCR NEGATIVE NEGATIVE Final   Influenza B by PCR NEGATIVE NEGATIVE Final    Comment: (NOTE) The Xpert Xpress SARS-CoV-2/FLU/RSV plus assay is intended as an aid in the diagnosis of influenza from Nasopharyngeal swab specimens and should not be used as a sole basis for treatment. Nasal washings and aspirates are unacceptable for Xpert Xpress SARS-CoV-2/FLU/RSV testing.  Fact Sheet for Patients: EntrepreneurPulse.com.au  Fact Sheet for Healthcare Providers: IncredibleEmployment.be  This test is not yet approved or cleared by the [place] FDA and has been authorized for detection and/or diagnosis of SARS-CoV-2 by FDA under an Emergency Use Authorization (EUA). This EUA will  remain in effect (meaning this test can be used) for the duration of the COVID-19 declaration under Section 564(b)(1) of the Act, 21 U.S.C. section 360bbb-3(b)(1), unless the authorization is terminated or revoked.  Performed at [facility], [place] [address], [place], [place] [address]   Aerobic/Anaerobic Culture w Gram Stain (surgical/deep wound)     Status: None   Collection Time: 05/16/20 11:22 AM   Specimen: PATH Other; Tissue  Result Value Ref Range Status   Specimen Description   Final    TISSUE Performed at [place] [address] [place], [place] [address]    Special Requests   Final    NONE Performed at [facility], [place] [address] [place], [place] [address]    Gram Stain   Final    MODERATE WBC PRESENT, PREDOMINANTLY PMN FEW GRAM POSITIVE COCCI IN PAIRS MODERATE GRAM NEGATIVE RODS    Culture   Final    ABUNDANT ESCHERICHIA COLI Confirmed Extended Spectrum Beta-Lactamase Producer (ESBL).  In bloodstream infections from ESBL organisms, carbapenems are preferred over piperacillin/tazobactam. They are shown to have a lower risk of mortality. NO ANAEROBES ISOLATED Performed at [facility], [place] [address] [place], [place] [address]    Report Status 05/21/2020 FINAL  Final   Organism ID, Bacteria ESCHERICHIA COLI  Final      Susceptibility   Escherichia coli - MIC*    AMPICILLIN >=32 RESISTANT Resistant     CEFAZOLIN >=64 RESISTANT Resistant     CEFEPIME 8 INTERMEDIATE Intermediate     CEFTAZIDIME RESISTANT Resistant     CEFTRIAXONE >=64 RESISTANT Resistant     CIPROFLOXACIN <=0.25 SENSITIVE Sensitive     GENTAMICIN <=1 SENSITIVE Sensitive     IMIPENEM <=0.25 SENSITIVE Sensitive     TRIMETH/SULFA <=20 SENSITIVE Sensitive     AMPICILLIN/SULBACTAM >=32 RESISTANT Resistant     PIP/TAZO 8 SENSITIVE Sensitive     * ABUNDANT ESCHERICHIA COLI  Aerobic/Anaerobic Culture (surgical/deep wound)     Status:  None (Preliminary result)   Collection Time: 05/18/20 12:01 PM   Specimen: Abscess  Result Value Ref Range Status   Specimen Description   Final    ABSCESS RT KIDNEY Performed at [facility] [address], [place], [place] [address]    Special Requests   Final    Normal Performed at [facility], [place] [address], [place], [place] [address]    Gram Stain   Final  ABUNDANT WBC PRESENT,BOTH PMN AND MONONUCLEAR FEW GRAM NEGATIVE RODS Performed at [facility], [place] [address], [place], [place] [address]    Culture   Final    ABUNDANT ESCHERICHIA COLI Confirmed Extended Spectrum Beta-Lactamase Producer (ESBL).  In bloodstream infections from ESBL organisms, carbapenems are preferred over piperacillin/tazobactam. They are shown to have a lower risk of mortality. NO ANAEROBES ISOLATED; CULTURE IN PROGRESS FOR 5 DAYS    Report Status PENDING  Incomplete   Organism ID, Bacteria ESCHERICHIA COLI  Final      Susceptibility   Escherichia coli - MIC*    AMPICILLIN >=32 RESISTANT Resistant     CEFAZOLIN >=64 RESISTANT Resistant     CEFEPIME 8 INTERMEDIATE Intermediate     CEFTAZIDIME RESISTANT Resistant     CEFTRIAXONE >=64 RESISTANT Resistant     CIPROFLOXACIN <=0.25 SENSITIVE Sensitive     GENTAMICIN <=1 SENSITIVE Sensitive     IMIPENEM <=0.25 SENSITIVE Sensitive     TRIMETH/SULFA <=20 SENSITIVE Sensitive     AMPICILLIN/SULBACTAM >=32 RESISTANT Resistant     PIP/TAZO 8 SENSITIVE Sensitive     * ABUNDANT ESCHERICHIA COLI         Radiology Studies: No results found.      Scheduled Meds: . isosorbide mononitrate  15 mg Oral Daily  . pantoprazole  40 mg Oral Q0600  . polyethylene glycol  34 g Oral BID  . pravastatin  20 mg Oral Daily  . sodium chloride flush  5 mL Intracatheter Q8H  . vitamin B-12  500 mcg Oral Daily  . warfarin  2.5 mg Oral ONCE-1600  . Warfarin - Pharmacist Dosing Inpatient   Does not apply q1600    Continuous Infusions: . sodium chloride 10 mL/hr at 05/22/20 1840  . meropenem (MERREM) IV 1 g (05/23/20 1058)     LOS: 7 days    Time spent: 35 minutes    Irine Seal, MD [facility]   To contact the attending provider between 7A-7P or the covering provider during after hours 7P-7A, please log into the web site [email or web address] and access using universal [place] password for that web site. If you do not have the password, please call the hospital operator.  05/23/2020, 11:40 AM

## 2020-05-23 NOTE — Progress Notes (Signed)
ANTICOAGULATION CONSULT NOTE - Follow up [place] for Warfarin Indication: history of DVT/PE, atrial fibrillation   Allergies  Allergen Reactions  . Darifenacin Nausea Only and Other (See Comments)    dry mouth and dizziness ENABLEX Dizziness Pt denies  . Codeine Other (See Comments)  . Sulfamethoxazole-Trimethoprim Other (See Comments)  . Lisinopril Cough    Pt denies    Patient Measurements: Height: 6' (182.9 cm) Weight: 97.1 kg (214 lb) IBW/kg (Calculated) : 77.6  Labs: Recent Labs    05/21/20 0517 05/22/20 0512 05/23/20 0711  HGB 10.8* 11.1* 10.8*  HCT 34.4* 35.2* 34.0*  PLT 315 343 351  LABPROT 17.7* 19.1* 22.0*  INR 1.5* 1.6* 1.9*  CREATININE 1.38* 1.07 1.11    Estimated Creatinine Clearance: 56.6 mL/min (by C-G formula based on SCr of 1.11 mg/dL).  Medications:  Home warfarin regimen: 2.5mg  PO daily-last dose reported 05/14/2020 at 2100.  INR supratherapeutic at 3.3.   Assessment: 62 y/oM with PMH of prostate CA, aortic stenosis s/p TAVR, atrial fibrillation, history of recurrent  (remote) DVT/PE s/p IVC filter on warfarin who presented with rectal bleeding, supratherapeutic INR, and anemia.  Significant events:  4/9 Vitamin K 2.5mg  PO x 1  4/10: INR 3.4 in AM; Urgent I&D of perirectal abscess, given KCentra and Vitamin K 10mg  IV x 1 pre-procedure. Postprocedure INR 1.4 and Warfarin resumed in PM  4/11: warfarin held for drainage of R subcapsular renal fluid collection  4/14: resuming warfarin  Today, 05/23/2020:  INR 1.9, remains subtherapeutic but increasing  Hgb low/stable at 10.8, Plt stable WNL  No further rectal bleeding noted or procedures planned  Meal intake not reported.  No major DDIs with warfarin noted  Goal of Therapy:  INR 2-3 Monitor platelets by anticoagulation protocol: Yes   Plan:   Warfarin 2.5 mg PO x 1 today  Daily PT/INR, CBC  Monitor for s/sx of bleeding   Gretta Arab PharmD,  BCPS Clinical Pharmacist [facility] main pharmacy [phone] 05/23/2020 11:06 AM

## 2020-05-23 NOTE — Progress Notes (Addendum)
Referring Physician(s): Chiu,S  Supervising Physician: Mir, Biochemist, clinical  Patient Status:  [facility] - In-pt  Chief Complaint: Right renalsubcapsular abscess/flank pain   Subjective: Patient awake, answering questions okay, daughter in room; only complaint is some soreness at abdominal drain insertion site; has been out of bed to chair; denies dysuria   Allergies: Darifenacin, Codeine, Sulfamethoxazole-trimethoprim, and Lisinopril  Medications: Prior to Admission medications   Medication Sig Start Date End Date Taking? Authorizing Provider  acetaminophen (TYLENOL) 500 MG tablet Take 500 mg by mouth at bedtime.    Yes [provider]  amLODipine (NORVASC) 2.5 MG tablet Take 1 tablet (2.5 mg total) by mouth daily. 09/03/19  Yes Tobb, Kardie, DO  Ascorbic Acid (VITAMIN C) 1000 MG tablet Take 1,000 mg by mouth daily.   Yes [provider]  Biotin 2500 MCG CAPS Take 2,500 mcg by mouth daily.   Yes [provider]  cholecalciferol (VITAMIN D) 1000 UNITS tablet Take 1,000 Units by mouth daily.   Yes [provider]  docusate sodium (COLACE) 50 MG capsule Take 50 mg by mouth daily.   Yes [provider]  furosemide (LASIX) 20 MG tablet Take 20 mg by mouth daily.   Yes [provider]  Glucosamine HCl (GLUCOSAMINE PO) Take 2,000 mg by mouth daily.   Yes [provider]  isosorbide mononitrate (IMDUR) 30 MG 24 hr tablet Take 0.5 tablets (15 mg total) by mouth daily. 02/05/20  Yes Richardo Priest, MD  Multiple Vitamin (MULTIVITAMIN) capsule Take 1 capsule by mouth daily.  04/28/19  Yes [provider]  nitroGLYCERIN (NITROSTAT) 0.4 MG SL tablet Place 1 tablet (0.4 mg total) under the tongue every 5 (five) minutes as needed. 07/14/19  Yes Richardo Priest, MD  Omega-3 Fatty Acids (FISH OIL) 1000 MG CAPS Take 1,000 mg by mouth daily.   Yes [provider]  omeprazole (PRILOSEC) 20 MG capsule Take 1 capsule (20 mg total) by  mouth daily. 04/21/20  Yes Richardo Priest, MD  pravastatin (PRAVACHOL) 20 MG tablet TAKE ONE TABLET BY MOUTH ONCE DAILY Patient taking differently: Take 20 mg by mouth daily. 01/08/20  Yes Richardo Priest, MD  vitamin B-12 (CYANOCOBALAMIN) 500 MCG tablet Take 500 mcg by mouth daily.   Yes [provider]  warfarin (COUMADIN) 2.5 MG tablet Take 1 tablet daily or as directed by Coumadin Clinic Patient taking differently: Take 2.5 mg by mouth daily. 04/16/20  Yes Richardo Priest, MD  cefpodoxime (VANTIN) 200 MG tablet Take 1 tablet (200 mg total) by mouth 2 (two) times daily. Patient not taking: No sig reported 04/23/20   Molpus, Jenny Reichmann, MD     Vital Signs: BP (!) 117/55 (BP Location: Left Arm)   Pulse 64   Temp 98 F (36.7 C) (Oral)   Resp 20   Ht 6' (1.829 m)   Wt 214 lb (97.1 kg)   SpO2 98%   BMI 29.02 kg/m   Physical Exam awake, alert.  Right lateral abdominal drain intact, insertion site with minimal erythema, StatLock device intact, output 40 cc of turbid beige-colored fluid.  Drain flushed without difficulty.  Patient does have some skin discomfort with pulling of tape  Imaging: No results found.  Labs:  CBC: Recent Labs    05/20/20 0517 05/21/20 0517 05/22/20 0512 05/23/20 0711  WBC 11.0* 10.0 10.7* 10.2  HGB 10.6* 10.8* 11.1* 10.8*  HCT 33.5* 34.4* 35.2* 34.0*  PLT 361 315 343 351    COAGS:  Recent Labs    04/22/20 2253 04/27/20 1102 05/20/20 0517 05/21/20 0517 05/22/20 0512 05/23/20 0711  INR 2.7*   < > 1.5* 1.5* 1.6* 1.9*  APTT 43*  --   --   --   --   --    < > = values in this interval not displayed.    BMP: Recent Labs    07/14/19 1343 08/24/19 2011 09/08/19 0920 04/22/20 2253 05/20/20 0517 05/21/20 0517 05/22/20 0512 05/23/20 0711  NA 143 139 141   < > 136 137 133* 134*  K 4.4 3.9 4.1   < > 3.9 4.2 4.2 4.6  CL 109* 108 106   < > 102 106 102 102  CO2 18* 23 22   < > 27 25 23 24   GLUCOSE 131* 110* 107*   < > 130* 119* 117* 122*   BUN 30* 19 22   < > 20 22 23 21   CALCIUM 10.4* 9.6 10.0   < > 9.4 9.2 9.2 9.5  CREATININE 1.66* 1.43* 1.42*   < > 1.38* 1.38* 1.07 1.11  GFRNONAA 37* 44* 44*   < > 49* 49* >60 >60  GFRAA 43* 51* 51*  --   --   --   --   --    < > = values in this interval not displayed.    LIVER FUNCTION TESTS: Recent Labs    04/27/20 1102 05/17/20 0659 05/18/20 0449 05/19/20 0515  BILITOT 1.3* 0.9 0.8 1.1  AST 54* 16 20 16   ALT 19 9 9 8   ALKPHOS 103 71 69 68  PROT 7.5 5.8* 5.8* 6.0*  ALBUMIN 3.1* 2.3* 2.2* 2.4*    Assessment and Plan: Patient with history of recent I&D of perirectal abscess, right nephrolithiasis/pyelonephritis with enlarging right renal subcapsular fluid collection, status post drain placement on 4/12; afebrile, BP okay, WBC normal, hemoglobin 10.8 (11.1), creatinine normal, drain fluid cultures growing E. Coli-patient on meropenem; will plan to check follow-up CT on 4/18; recommend urology follow-up while pt in house. Plans d/w pt/daughter/nurse.  Electronically Signed: D. Rowe Robert, PA-C 05/23/2020, 2:02 PM   I spent a total of 15 minutes at the the patient's bedside AND on the patient's hospital floor or unit, greater than 50% of which was counseling/coordinating care for right renal subcapsular abscess drain    Patient ID: [name], male   DOB: 03-20-1931, 87 y.o.   MRN: [id number]

## 2020-05-24 ENCOUNTER — Inpatient Hospital Stay (HOSPITAL_COMMUNITY): Payer: Medicare Other

## 2020-05-24 DIAGNOSIS — I4819 Other persistent atrial fibrillation: Secondary | ICD-10-CM | POA: Diagnosis not present

## 2020-05-24 DIAGNOSIS — Z7189 Other specified counseling: Secondary | ICD-10-CM

## 2020-05-24 DIAGNOSIS — Z515 Encounter for palliative care: Secondary | ICD-10-CM | POA: Diagnosis not present

## 2020-05-24 DIAGNOSIS — R531 Weakness: Secondary | ICD-10-CM | POA: Diagnosis not present

## 2020-05-24 DIAGNOSIS — K611 Rectal abscess: Secondary | ICD-10-CM | POA: Diagnosis not present

## 2020-05-24 DIAGNOSIS — N1831 Chronic kidney disease, stage 3a: Secondary | ICD-10-CM | POA: Diagnosis not present

## 2020-05-24 DIAGNOSIS — K5909 Other constipation: Secondary | ICD-10-CM | POA: Diagnosis not present

## 2020-05-24 DIAGNOSIS — B9620 Unspecified Escherichia coli [E. coli] as the cause of diseases classified elsewhere: Secondary | ICD-10-CM

## 2020-05-24 DIAGNOSIS — N12 Tubulo-interstitial nephritis, not specified as acute or chronic: Secondary | ICD-10-CM | POA: Diagnosis not present

## 2020-05-24 DIAGNOSIS — N151 Renal and perinephric abscess: Secondary | ICD-10-CM | POA: Diagnosis not present

## 2020-05-24 LAB — BASIC METABOLIC PANEL
Anion gap: 8 (ref 5–15)
BUN: 20 mg/dL (ref 8–23)
CO2: 27 mmol/L (ref 22–32)
Calcium: 9.7 mg/dL (ref 8.9–10.3)
Chloride: 102 mmol/L (ref 98–111)
Creatinine, Ser: 1.17 mg/dL (ref 0.61–1.24)
GFR, Estimated: >60 mL/min (ref >60)
Glucose, Bld: 182 mg/dL — ABNORMAL HIGH (ref 70–99)
Potassium: 4.3 mmol/L (ref 3.5–5.1)
Sodium: 137 mmol/L (ref 135–145)

## 2020-05-24 LAB — CBC WITH DIFFERENTIAL/PLATELET
Abs Immature Granulocytes: 0.15 10*3/uL — ABNORMAL HIGH (ref 0.00–0.07)
Basophils Absolute: 0.1 10*3/uL (ref 0.0–0.1)
Basophils Relative: 1 %
Eosinophils Absolute: 0.1 10*3/uL (ref 0.0–0.5)
Eosinophils Relative: 1 %
HCT: 35.2 % — ABNORMAL LOW (ref 39.0–52.0)
Hemoglobin: 11.3 g/dL — ABNORMAL LOW (ref 13.0–17.0)
Immature Granulocytes: 2 %
Lymphocytes Relative: 11 %
Lymphs Abs: 1.1 10*3/uL (ref 0.7–4.0)
MCH: 25.9 pg — ABNORMAL LOW (ref 26.0–34.0)
MCHC: 32.1 g/dL (ref 30.0–36.0)
MCV: 80.5 fL (ref 80.0–100.0)
Monocytes Absolute: 0.8 10*3/uL (ref 0.1–1.0)
Monocytes Relative: 8 %
Neutro Abs: 7.9 10*3/uL — ABNORMAL HIGH (ref 1.7–7.7)
Neutrophils Relative %: 77 %
Platelets: 319 10*3/uL (ref 150–400)
RBC: 4.37 MIL/uL (ref 4.22–5.81)
RDW: 18.9 % — ABNORMAL HIGH (ref 11.5–15.5)
WBC: 10.1 10*3/uL (ref 4.0–10.5)
nRBC: 0 % (ref 0.0–0.2)

## 2020-05-24 LAB — PROTIME-INR
INR: 2.4 — ABNORMAL HIGH (ref 0.8–1.2)
Prothrombin Time: 25.9 seconds — ABNORMAL HIGH (ref 11.4–15.2)

## 2020-05-24 MED ORDER — SODIUM CHLORIDE 0.9% FLUSH
10.0000 mL | INTRAVENOUS | Status: DC | PRN
  Administered 2020-05-25: 10 mL

## 2020-05-24 MED ORDER — QUETIAPINE FUMARATE 25 MG PO TABS
25.0000 mg | ORAL_TABLET | Freq: Every evening | ORAL | Status: DC | PRN
  Administered 2020-05-24: 25 mg via ORAL
  Filled 2020-05-24: qty 1

## 2020-05-24 MED ORDER — WARFARIN SODIUM 2.5 MG PO TABS
2.5000 mg | ORAL_TABLET | Freq: Once | ORAL | Status: AC
  Administered 2020-05-24: 2.5 mg via ORAL
  Filled 2020-05-24: qty 1

## 2020-05-24 MED ORDER — IOHEXOL 300 MG/ML  SOLN
100.0000 mL | Freq: Once | INTRAMUSCULAR | Status: AC | PRN
  Administered 2020-05-24: 100 mL via INTRAVENOUS

## 2020-05-24 MED ORDER — SODIUM CHLORIDE 0.9% FLUSH
10.0000 mL | Freq: Two times a day (BID) | INTRAVENOUS | Status: DC
  Administered 2020-05-24 – 2020-05-28 (×9): 10 mL

## 2020-05-24 NOTE — Progress Notes (Addendum)
ANTICOAGULATION CONSULT NOTE - Follow up [facility] for Warfarin Indication: history of DVT/PE, atrial fibrillation   Allergies  Allergen Reactions  . Darifenacin Nausea Only and Other (See Comments)    dry mouth and dizziness ENABLEX Dizziness Pt denies  . Codeine Other (See Comments)  . Sulfamethoxazole-Trimethoprim Other (See Comments)  . Lisinopril Cough    Pt denies    Patient Measurements: Height: 6' (182.9 cm) Weight: 97.1 kg (214 lb) IBW/kg (Calculated) : 77.6  Labs: Recent Labs    05/22/20 0512 05/23/20 0711 05/24/20 0829  HGB 11.1* 10.8* 11.3*  HCT 35.2* 34.0* 35.2*  PLT 343 351 319  LABPROT 19.1* 22.0* 25.9*  INR 1.6* 1.9* 2.4*  CREATININE 1.07 1.11 1.17    Estimated Creatinine Clearance: 53.7 mL/min (by C-G formula based on SCr of 1.17 mg/dL).  Medications:  Home warfarin regimen: 2.5mg  PO daily-last dose reported 05/14/2020 at 2100.  INR supratherapeutic at 3.3.   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  Significant events:  4/9 Vitamin K 2.5mg  PO x 1  4/10: INR 3.4 in AM; Urgent I&D of perirectal abscess, given KCentra and Vitamin K 10mg  IV x 1 pre-procedure. Postprocedure INR 1.4 and Warfarin resumed in PM  4/11: warfarin held for drainage of R subcapsular renal fluid collection  4/14: resuming warfarin  Today, 05/24/2020:  INR 2.4, now therapeutic  Hgb low/stable at 11.3, Plt stable WNL  No further rectal bleeding noted or procedures planned  Meal intake not reported.  No major DDIs with warfarin noted  Goal of Therapy:  INR 2-3 Monitor platelets by anticoagulation protocol: Yes   Plan:   Warfarin 2.5 mg PO x 1 today  Daily PT/INR, CBC  Monitor for s/sx of bleeding   Eloise Levels, Student PharmD 05/24/2020 1:25 PM     I  discussed / reviewed the pharmacy note by Eloise Levels, Student PharmD, and I agree with his findings and plans as documented.   Lindell Spar, PharmD, BCPS Clinical Pharmacist  05/24/2020 2:04 PM

## 2020-05-24 NOTE — Progress Notes (Signed)
Referring Physician(s): Dr. Wyline Copas, S.   Supervising Physician: Arne Cleveland  Patient Status:  [facility] - In-pt  Chief Complaint:  S/p right renal subcapsular abscess drain placement on 05/18/20 with Dr. Laurence Ferrari   Subjective:  Patient laying in bed with his family members at the bedside.  Family states that patient is still confused this morning. No abdominal complaints per family.  Afebrile, WBC normal. Overnight OP 20 cc.   Allergies: Darifenacin, Codeine, Sulfamethoxazole-trimethoprim, and Lisinopril  Medications: Prior to Admission medications   Medication Sig Start Date End Date Taking? Authorizing Provider  acetaminophen (TYLENOL) 500 MG tablet Take 500 mg by mouth at bedtime.    Yes [provider]  amLODipine (NORVASC) 2.5 MG tablet Take 1 tablet (2.5 mg total) by mouth daily. 09/03/19  Yes Tobb, Kardie, DO  Ascorbic Acid (VITAMIN C) 1000 MG tablet Take 1,000 mg by mouth daily.   Yes [provider]  Biotin 2500 MCG CAPS Take 2,500 mcg by mouth daily.   Yes [provider]  cholecalciferol (VITAMIN D) 1000 UNITS tablet Take 1,000 Units by mouth daily.   Yes [provider]  docusate sodium (COLACE) 50 MG capsule Take 50 mg by mouth daily.   Yes [provider]  furosemide (LASIX) 20 MG tablet Take 20 mg by mouth daily.   Yes [provider]  Glucosamine HCl (GLUCOSAMINE PO) Take 2,000 mg by mouth daily.   Yes [provider]  isosorbide mononitrate (IMDUR) 30 MG 24 hr tablet Take 0.5 tablets (15 mg total) by mouth daily. 02/05/20  Yes Richardo Priest, MD  Multiple Vitamin (MULTIVITAMIN) capsule Take 1 capsule by mouth daily.  04/28/19  Yes [provider]  nitroGLYCERIN (NITROSTAT) 0.4 MG SL tablet Place 1 tablet (0.4 mg total) under the tongue every 5 (five) minutes as needed. 07/14/19  Yes Richardo Priest, MD  Omega-3 Fatty Acids (FISH OIL) 1000 MG CAPS Take 1,000 mg by mouth daily.   Yes [provider]  omeprazole (PRILOSEC) 20 MG capsule Take 1 capsule (20 mg total) by mouth daily. 04/21/20  Yes Richardo Priest, MD  pravastatin (PRAVACHOL) 20 MG tablet TAKE ONE TABLET BY MOUTH ONCE DAILY Patient taking differently: Take 20 mg by mouth daily. 01/08/20  Yes Richardo Priest, MD  vitamin B-12 (CYANOCOBALAMIN) 500 MCG tablet Take 500 mcg by mouth daily.   Yes [provider]  warfarin (COUMADIN) 2.5 MG tablet Take 1 tablet daily or as directed by [facility] Patient taking differently: Take 2.5 mg by mouth daily. 04/16/20  Yes Richardo Priest, MD  cefpodoxime (VANTIN) 200 MG tablet Take 1 tablet (200 mg total) by mouth 2 (two) times daily. Patient not taking: No sig reported 04/23/20   Molpus, Jenny Reichmann, MD     Vital Signs: BP (!) 117/55 (BP Location: Left Arm)   Pulse 64   Temp 98 F (36.7 C) (Oral)   Resp 20   Ht 6' (1.829 m)   Wt 214 lb (97.1 kg)   SpO2 98%   BMI 29.02 kg/m   Physical Exam Vitals and nursing note reviewed.  Constitutional:      General: He is not in acute distress. Cardiovascular:     Rate and Rhythm: Normal rate.  Pulmonary:     Effort: Pulmonary effort is normal.  Abdominal:     General: Bowel sounds are normal.     Palpations: Abdomen is soft.     Comments: Positive right flank drain to  a suction bulb. Site is unremarkable with no erythema, edema, tenderness, bleeding or drainage. Suture and stat lock in place. Dressing is clean, dry, and intact. 20 ml of purulent, light salmon colored fluid noted in the suction bulb. Drain aspirates and flushes well.   Skin:    General: Skin is warm and dry.  Neurological:     Mental Status: Mental status is at baseline. He is disoriented.     Imaging: No results found.  Labs:  CBC: Recent Labs    05/21/20 0517 05/22/20 0512 05/23/20 0711 05/24/20 0829  WBC 10.0 10.7* 10.2 10.1  HGB 10.8* 11.1* 10.8* 11.3*  HCT 34.4* 35.2* 34.0* 35.2*  PLT 315 343 351 319    COAGS: Recent Labs     04/22/20 2253 04/27/20 1102 05/20/20 0517 05/21/20 0517 05/22/20 0512 05/23/20 0711  INR 2.7*   < > 1.5* 1.5* 1.6* 1.9*  APTT 43*  --   --   --   --   --    < > = values in this interval not displayed.    BMP: Recent Labs    07/14/19 1343 08/24/19 2011 09/08/19 0920 04/22/20 2253 05/20/20 0517 05/21/20 0517 05/22/20 0512 05/23/20 0711  NA 143 139 141   < > 136 137 133* 134*  K 4.4 3.9 4.1   < > 3.9 4.2 4.2 4.6  CL 109* 108 106   < > 102 106 102 102  CO2 18* 23 22   < > 27 25 23 24   GLUCOSE 131* 110* 107*   < > 130* 119* 117* 122*  BUN 30* 19 22   < > 20 22 23 21   CALCIUM 10.4* 9.6 10.0   < > 9.4 9.2 9.2 9.5  CREATININE 1.66* 1.43* 1.42*   < > 1.38* 1.38* 1.07 1.11  GFRNONAA 37* 44* 44*   < > 49* 49* >60 >60  GFRAA 43* 51* 51*  --   --   --   --   --    < > = values in this interval not displayed.    LIVER FUNCTION TESTS: Recent Labs    04/27/20 1102 05/17/20 0659 05/18/20 0449 05/19/20 0515  BILITOT 1.3* 0.9 0.8 1.1  AST 54* 16 20 16   ALT 19 9 9 8   ALKPHOS 103 71 69 68  PROT 7.5 5.8* 5.8* 6.0*  ALBUMIN 3.1* 2.3* 2.2* 2.4*    Assessment and Plan:  S/p right renal subcapsular abscess drain placement on 05/18/20 with Dr. Laurence Ferrari   Patient remains stable. WBC normal, afebrile.  20 cc purulent fluid in the suction bulb.  Drain and dressing are clean, dry, intact. Drain aspirates and flushes well.  Patient is scheduled for follow up CT AP with IV contrast today.  Palliative has been consulted.  Continue with flushing TID, output recording q shift and dressing changes as needed. Would consider additional imaging when output is less than 10 ml for 24 hours not including flush material.    Further treatment plan per [facility]/GI/Palliative. Appreciate and agree with the plan.  IR to follow.   Electronically Signed: Tera Mater, PA-C 05/24/2020, 9:07 AM   I spent a total of 15 Minutes at the the patient's bedside AND on the patient's hospital floor or  unit, greater than 50% of which was counseling/coordinating care for right renal subcapsular abscess drain

## 2020-05-24 NOTE — Progress Notes (Signed)
OT Cancellation Note  Patient Details Name: YAIR DUSZA MRN: [id number] DOB: 1931-06-25   Cancelled Treatment:    Reason Eval/Treat Not Completed: Fatigue/lethargy limiting ability to participate following PT and getting cleaned up with NT. Will follow.  Malka So 05/24/2020, 2:34 PM  Nestor Lewandowsky, OTR/L Acute Rehabilitation Services Pager: [phone] Office: [phone]
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
## 2020-05-24 NOTE — Progress Notes (Signed)
PROGRESS NOTE    [name]  AJO:[id number] DOB: 1931/11/16 DOA: 05/15/2020 PCP: Shirline Frees, MD    Chief Complaint  Patient presents with  . 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 Also found to have 6g drop in hgb with heme pos stools on presentation. Patient also noted to have a renal abscess status post drain placement with cultures pending.   Assessment & Plan:   Principal Problem:   Perirectal abscess Active Problems:   Hypertensive heart disease   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)   Gastro-esophageal reflux disease without esophagitis   Long term (current) use of anticoagulants   Persistent atrial fibrillation (HCC)   CKD (chronic kidney disease), stage III (HCC)   S/P TAVR (transcatheter aortic valve replacement)   OSA on CPAP   CAD (coronary artery disease), native coronary artery   Atrial fibrillation, persistent (HCC)   Pyelonephritis of right kidney   Presence of IVC filter   Constipation, chronic   Cysts of right kidney   Renal abscess  1 perirectal abscess secondary to ESBL E. coli status post I&D 4/10 -Noted on CT abdomen and pelvis. -General surgery consulted and patient underwent incision and drainage(4/10) per general surgery. -Cultures from incision and drainage with abundant ESBL E. coli sensitive to Zosyn, Cipro, gentamicin, imipenem, Bactrim and resistant to Unasyn, ampicillin, cefazolin, ceftazidime, Rocephin with intermediate sensitivity to cefepime. -Due to concurrent renal abscess, ID consulted and IV antibiotics changed to IV Merrem.   -Continue sitz bath's as  recommended per general surgery.   -General surgery was following but have signed off.   -Outpatient follow-up with general surgery.   2.  Right renal subcapsular fluid collection/renal abscess/ Persistent pyelonephritis/nonobstructing nephrolithiasis. -Patient noted to have a right renal subcapsular fluid collection/renal abscess, -Patient noted to have had a UTI approximately a month ago placed on Bactrim later reevaluated in the ED placed on cefpodoxime.  Urine cultures with no growth. -Cultures from this admission negative. -Unable to perform MRI secondary to pacemaker. -Dr Wyline Copas discussed with urology who recommended drainage per IR. -Status post drain placement per IR with purulent fluid noted and cultures positive for ESBL E. coli.   -Per IR once output from drainage < 10cc, will need repeat CT scan. -Repeat CT abdomen and pelvis ordered and done this morning with results pending. -There was concern on 05/22/2020, about drain insertion site concerning for infection however assessed insertion site of drain myself which is nonerythematous, no warmth, no significant tenderness to palpation. -IR recommending urology consultation/input during this hospitalization and as such urology has been consulted today. -Continue IV Merrem for ESBL per ID recommendations. -ID following and appreciate input and recommendations.  3.  Anemia/melena -Concern for acute GI bleed in the setting of Coumadin use. -Patient noted to be followed closely at [facility] for GI for history of hemorrhoids in the past. -Hemoglobin noted to have gone down to 9.7 from 16.2 (04/27/2020). -Hemoglobin stable at 11.3. -Patient with no overt bleeding. -Stools noted to be heme positive. -Dr. Wyline Copas discussed with GI on-call, and given recent surgical drainage of perirectal abscess recommendation is to hold off on inpatient endoscopy for now and follow-up in the outpatient setting when more  stable. -Coumadin has been resumed.  Monitor  closely.  4.  History of persistent atrial fibrillation - CHA2DSVASC score 5 -Coumadin was held initially during the hospitalization, due to concerns for anemia/melena.   -Coumadin resumed.   -INR at 2.4.   -Hemoglobin stable at 11.3.   -Coumadin per pharmacy.  5.  History of recurrent DVT/PE with supratherapeutic INR -Patient noted to have a history of provoked DVT following prostatectomy status post IVC placement afterwards. -Patient noted to have developed bacteremia and PE and DVT and subsequently placed on warfarin. -Patient's PE and DVT noted to be remote. -Coumadin held initially due to concern for dark stools overnight.   -Coumadin resumed.   -INR at 2.4 today..   -Coumadin per pharmacy.   -Hemoglobin stable at 11.3.   6.  History of heart block status post PPM -Outpatient follow-up with cardiology.    7.  History of aortic stenosis status post TAVR -Appears to be a bioprosthetic valve. -Outpatient follow-up.  8.  Coronary artery disease status post stent -Continue Imdur, Pravachol.   -Lasix and Norvasc on hold due to borderline blood pressure.   -Likely resume Lasix in the next 1-2 days.    9.  OSA Patient noted to be refusing CPAP at bedtime and has been changed to as needed.  10.  Chronic kidney disease stage IIIa -Stable.  11.  Borderline blood pressure -Patient noted to have borderline blood pressure 05/21/2020.   -Lasix and Norvasc held with improvement with blood pressure.   -Continue Imdur. -Could likely resume Lasix in the next 1-2 days.  12.  Constipation -Resolved on current bowel regimen.   -Continue MiraLAX twice daily.     DVT prophylaxis: SCDs Code Status: Full Family Communication: Updated patient and wife at bedside. Disposition:   Status is: Inpatient    Dispo: The patient is from: Home              Anticipated d/c is to: Home with home health.               Patient currently with a perirectal abscess status post incision and  drainage with cultures of ESBL, renal abscess status post drain placement per IR cultures positive for ESBL, on IV antibiotics, ID following.  Repeat CT abdomen and pelvis pending.  Not stable for discharge.   Difficult to place patient no       Consultants:   General surgery: Dr. Donne Hazel 05/16/2020  Interventional radiology: Dr. Laurence Ferrari 05/17/2020  Infectious disease: Dr. Juleen China 05/19/2020  Urology pending  Palliative care pending  Procedures:   CT abdomen and pelvis 05/15/2020  Placement of 12 French percutaneous drainage catheter with aspiration of 135 mL of frankly pleural fluid from subscapular perinephric fluid collection-Per IR, Dr. Laurence Ferrari 05/18/2020  Incision and drainage of perirectal abscess per general surgery, Dr. Donne Hazel 05/16/2020  Antimicrobials:   IV Merrem 05/19/2020>>>>  IV Zosyn 05/15/2020 >>>> 05/19/2020    Subjective: It is noted that overnight patient was agitated requiring soft restraints which have since been discontinued.  Patient alert and oriented to self place and time.  Stated it was March.   Patient denies being aggressive overnight to staff.  Patient denies any chest pain.  No shortness of breath.  No abdominal pain.  Requesting to be discharged home today.  Wife at bedside.    Objective: Vitals:   05/22/20 0553 05/22/20 1401 05/23/20 0545 05/23/20 1349  BP: (!) 143/56 (!) 124/56 137/66 (!) 117/55  Pulse: 60 63 61 64  Resp: 20 20 (!) 22 20  Temp:   97.7 F (36.5 C) 98 F (36.7 C)  TempSrc: Oral Oral Oral Oral  SpO2: 95% 97% 96% 98%  Weight:      Height:        Intake/Output Summary (Last 24 hours) at 05/24/2020 1152 Last data filed at 05/24/2020 0916 Gross per 24 hour  Intake 361 ml  Output 345 ml  Net 16 ml   Filed Weights   05/15/20 1923  Weight: 97.1 kg    Examination:  General exam: : NAD Respiratory system: Lungs clear to auscultation bilaterally anterior lung fields.  No wheezes, no crackles, no rhonchi.  Normal  respiratory effort.   Cardiovascular system: RRR no murmurs rubs or gallops.  No JVD.  No lower extremity edema. Gastrointestinal system: Abdomen is soft, nondistended, nontender, positive bowel sounds.  No rebound.  No guarding.  JP drain with decreased purulent drainage. Central nervous system: Alert and oriented.  No focal neurological deficits.  Moving extremities spontaneously.  Extremities: Symmetric 5 x 5 power. Skin: No rashes, lesions or ulcers Psychiatry: Judgement and insight appear normal. Mood & affect appropriate..     Data Reviewed: I have personally reviewed following labs and imaging studies  CBC: Recent Labs  Lab 05/20/20 0517 05/21/20 0517 05/22/20 0512 05/23/20 0711 05/24/20 0829  WBC 11.0* 10.0 10.7* 10.2 10.1  NEUTROABS  --   --   --   --  7.9*  HGB 10.6* 10.8* 11.1* 10.8* 11.3*  HCT 33.5* 34.4* 35.2* 34.0* 35.2*  MCV 80.1 80.8 80.2 80.2 80.5  PLT 361 315 343 351 646    Basic Metabolic Panel: Recent Labs  Lab 05/20/20 0517 05/21/20 0517 05/22/20 0512 05/23/20 0711 05/24/20 0829  NA 136 137 133* 134* 137  K 3.9 4.2 4.2 4.6 4.3  CL 102 106 102 102 102  CO2 27 25 23 24 27   GLUCOSE 130* 119* 117* 122* 182*  BUN 20 22 23 21 20   CREATININE 1.38* 1.38* 1.07 1.11 1.17  CALCIUM 9.4 9.2 9.2 9.5 9.7  MG 1.9 2.0  --   --   --     GFR: Estimated Creatinine Clearance: 53.7 mL/min (by C-G formula based on SCr of 1.17 mg/dL).  Liver Function Tests: Recent Labs  Lab 05/18/20 0449 05/19/20 0515  AST 20 16  ALT 9 8  ALKPHOS 69 68  BILITOT 0.8 1.1  PROT 5.8* 6.0*  ALBUMIN 2.2* 2.4*    CBG: No results for input(s): GLUCAP in the last 168 hours.   Recent Results (from the past 240 hour(s))  Resp Panel by RT-PCR (Flu A&B, Covid) Nasopharyngeal Swab     Status: None   Collection Time: 05/16/20  8:06 AM   Specimen: Nasopharyngeal Swab; Nasopharyngeal(NP) swabs in vial transport medium  Result Value Ref Range Status   SARS Coronavirus 2 by RT PCR  NEGATIVE NEGATIVE Final    Comment: (NOTE) SARS-CoV-2 target nucleic acids are NOT DETECTED.  The SARS-CoV-2 RNA is generally detectable in upper respiratory specimens during the acute phase of infection. The lowest concentration of SARS-CoV-2 viral copies this assay can detect is 138 copies/mL. A negative result does not preclude SARS-Cov-2 infection and should not be used as the sole basis for treatment or other patient management decisions. A negative result may occur with  improper specimen collection/handling, submission of specimen other than nasopharyngeal swab, presence of viral mutation(s) within the areas targeted by this assay, and inadequate number of viral copies(<138 copies/mL). A  negative result must be combined with clinical observations, patient history, and epidemiological information. The expected result is Negative.  Fact Sheet for Patients:  EntrepreneurPulse.com.au  Fact Sheet for Healthcare Providers:  IncredibleEmployment.be  This test is no t yet approved or cleared by the [place] FDA and  has been authorized for detection and/or diagnosis of SARS-CoV-2 by FDA under an Emergency Use Authorization (EUA). This EUA will remain  in effect (meaning this test can be used) for the duration of the COVID-19 declaration under Section 564(b)(1) of the Act, 21 U.S.C.section 360bbb-3(b)(1), unless the authorization is terminated  or revoked sooner.       Influenza A by PCR NEGATIVE NEGATIVE Final   Influenza B by PCR NEGATIVE NEGATIVE Final    Comment: (NOTE) The Xpert Xpress SARS-CoV-2/FLU/RSV plus assay is intended as an aid in the diagnosis of influenza from Nasopharyngeal swab specimens and should not be used as a sole basis for treatment. Nasal washings and aspirates are unacceptable for Xpert Xpress SARS-CoV-2/FLU/RSV testing.  Fact Sheet for Patients: EntrepreneurPulse.com.au  Fact Sheet for  Healthcare Providers: IncredibleEmployment.be  This test is not yet approved or cleared by the [place] FDA and has been authorized for detection and/or diagnosis of SARS-CoV-2 by FDA under an Emergency Use Authorization (EUA). This EUA will remain in effect (meaning this test can be used) for the duration of the COVID-19 declaration under Section 564(b)(1) of the Act, 21 U.S.C. section 360bbb-3(b)(1), unless the authorization is terminated or revoked.  Performed at [facility], [place] [address] [place], [place] [address]   Aerobic/Anaerobic Culture w Gram Stain (surgical/deep wound)     Status: None   Collection Time: 05/16/20 11:22 AM   Specimen: PATH Other; Tissue  Result Value Ref Range Status   Specimen Description   Final    TISSUE Performed at [place] [address] [place], [place] [address]    Special Requests   Final    NONE Performed at [facility], [place] [address] [place], [place] [address]    Gram Stain   Final    MODERATE WBC PRESENT, PREDOMINANTLY PMN FEW GRAM POSITIVE COCCI IN PAIRS MODERATE GRAM NEGATIVE RODS    Culture   Final    ABUNDANT ESCHERICHIA COLI Confirmed Extended Spectrum Beta-Lactamase Producer (ESBL).  In bloodstream infections from ESBL organisms, carbapenems are preferred over piperacillin/tazobactam. They are shown to have a lower risk of mortality. NO ANAEROBES ISOLATED Performed at [facility], [place] [address], [place], [place] [address]    Report Status 05/21/2020 FINAL  Final   Organism ID, Bacteria ESCHERICHIA COLI  Final      Susceptibility   Escherichia coli - MIC*    AMPICILLIN >=32 RESISTANT Resistant     CEFAZOLIN >=64 RESISTANT Resistant     CEFEPIME 8 INTERMEDIATE Intermediate     CEFTAZIDIME RESISTANT Resistant     CEFTRIAXONE >=64 RESISTANT Resistant     CIPROFLOXACIN <=0.25 SENSITIVE Sensitive     GENTAMICIN <=1 SENSITIVE  Sensitive     IMIPENEM <=0.25 SENSITIVE Sensitive     TRIMETH/SULFA <=20 SENSITIVE Sensitive     AMPICILLIN/SULBACTAM >=32 RESISTANT Resistant     PIP/TAZO 8 SENSITIVE Sensitive     * ABUNDANT ESCHERICHIA COLI  Aerobic/Anaerobic Culture (surgical/deep wound)     Status: None   Collection Time: 05/18/20 12:01 PM   Specimen: Abscess  Result Value Ref Range Status   Specimen Description   Final    ABSCESS  RT KIDNEY Performed at [facility], [place] [address], [place], [place] [address]    Special Requests   Final    Normal Performed at [facility], [place] [address] [place], [place] [address]    Gram Stain   Final    ABUNDANT WBC PRESENT,BOTH PMN AND MONONUCLEAR FEW GRAM NEGATIVE RODS    Culture   Final    ABUNDANT ESCHERICHIA COLI Confirmed Extended Spectrum Beta-Lactamase Producer (ESBL).  In bloodstream infections from ESBL organisms, carbapenems are preferred over piperacillin/tazobactam. They are shown to have a lower risk of mortality. NO ANAEROBES ISOLATED Performed at [facility], [place] [address], [place], [place] [address]    Report Status 05/23/2020 FINAL  Final   Organism ID, Bacteria ESCHERICHIA COLI  Final      Susceptibility   Escherichia coli - MIC*    AMPICILLIN >=32 RESISTANT Resistant     CEFAZOLIN >=64 RESISTANT Resistant     CEFEPIME 8 INTERMEDIATE Intermediate     CEFTAZIDIME RESISTANT Resistant     CEFTRIAXONE >=64 RESISTANT Resistant     CIPROFLOXACIN <=0.25 SENSITIVE Sensitive     GENTAMICIN <=1 SENSITIVE Sensitive     IMIPENEM <=0.25 SENSITIVE Sensitive     TRIMETH/SULFA <=20 SENSITIVE Sensitive     AMPICILLIN/SULBACTAM >=32 RESISTANT Resistant     PIP/TAZO 8 SENSITIVE Sensitive     * ABUNDANT ESCHERICHIA COLI         Radiology Studies: No results found.      Scheduled Meds: . isosorbide mononitrate  15 mg Oral Daily  . pantoprazole  40 mg Oral Q0600  . polyethylene glycol  34 g Oral BID  .  pravastatin  20 mg Oral Daily  . sodium chloride flush  5 mL Intracatheter Q8H  . vitamin B-12  500 mcg Oral Daily  . Warfarin - Pharmacist Dosing Inpatient   Does not apply q1600   Continuous Infusions: . sodium chloride 10 mL/hr at 05/22/20 1840  . meropenem (MERREM) IV 1 g (05/24/20 0120)     LOS: 8 days    Time spent: 35 minutes    Irine Seal, MD [facility]   To contact the attending provider between 7A-7P or the covering provider during after hours 7P-7A, please log into the web site [email or web address] and access using universal [place] password for that web site. If you do not have the password, please call the hospital operator.  05/24/2020, 11:52 AM

## 2020-05-24 NOTE — Consult Note (Signed)
Consultation Note Date: 05/24/2020   Patient Name: [name]  DOB: 07/27/1931  MRN: [id number]  Age / Sex: 87 y.o., male  PCP: Shirline Frees, MD Referring Physician: Eugenie Filler, MD  Reason for Consultation: Establishing goals of care  HPI/Patient Profile: 87 y.o. male  admitted on 05/15/2020    Clinical Assessment and Goals of Care: 87 year old gentleman with a history of prostate cancer status post prostatectomy 2016, history of atrial fibrillation recurrent DVT, history of PE history of aortic stenosis status post aortic valve replacement coronary artery disease hypertension has a pacemaker has stage III chronic kidney disease.  Patient presented with rectal pain found to have perirectal abscess.  Found to have renal abscess status post drain placement as well as has had acute blood loss anemia and melena in this hospitalization.  Infectious disease specialist are following, patient has IR placed 2 drains and imaging is also being obtained to help guide further treatment.  Palliative consultation has been requested for broad goals of care discussions.  Patient has just returned from a procedure.  His wife is present at the bedside.  I introduced myself and palliative care as follows:  Palliative medicine is specialized medical care for people living with serious illness. It focuses on providing relief from the symptoms and stress of a serious illness. The goal is to improve quality of life for both the patient and the family.  Goals of care: Broad aims of medical therapy in relation to the patient's values and preferences. Our aim is to provide medical care aimed at enabling patients to achieve the goals that matter most to them, given the circumstances of their particular medical situation and their constraints.   Brief life review performed.  Patient has 2 adult daughters from previous  relationship.  Patient's wife is healthcare power of attorney agent.  They completed their advanced directives sometime ago electing for not having any artificial life support at end-of-life.  Patient has had gradual progressive functional and cognitive decline.  He has been sick basically since the start of this year.  He has required multiple visits to the emergency room and has been placed on several rounds of oral antibiotics in the outpatient setting.  He has had to resort to using a walker more recently.  He has been more confused than usual.  Patient has always made his wishes known that he would not want his life prolonged by artificial means, he would not want to continue with therapies medications and hospitalizations if he was not improving.  Overall him and his wife elect for maintenance of good quality of life at all times.  We discussed about factors pertaining to this hospitalization.  Discussed about patient's current infection and measures being taken to resolve this.  CODE STATUS discussions also undertaken.  HCPOA Wife who is present at the bedside.  SUMMARY OF RECOMMENDATIONS   CODE STATUS has been clarified as to not resuscitate/DO NOT INTUBATE Monitor hospital course and overall disease trajectory of illness Recommend home-based palliative care on  discharge. Home with home health and home-based nursing support on discharge. Palliative will follow.  Thank you for the consult. Code Status/Advance Care Planning:  DNR    Symptom Management:      Palliative Prophylaxis:   Delirium Protocol   Psycho-social/Spiritual:   Desire for further Chaplaincy support:yes  Additional Recommendations: Caregiving  Support/Resources  Prognosis:   Unable to determine  Discharge Planning: To Be Determined,?home with palliative.      Primary Diagnoses: Present on Admission: . Atrial fibrillation, persistent ([place]) . CAD (coronary artery disease), native coronary artery .  Hypertensive heart disease . CKD (chronic kidney disease), stage III ([place]) . Persistent atrial fibrillation ([place]) . Gastro-esophageal reflux disease without esophagitis   I have reviewed the medical record, interviewed the patient and family, and examined the patient. The following aspects are pertinent.  Past Medical History:  Diagnosis Date  . Aortic valve stenosis 04/05/2015   Formatting of this note might be different from the original. Overview:  ECHO 5/15 Formatting of this note might be different from the original. Overview:  Overview:  ECHO 5/15  . Arthritis    "knees, right shoulder" (03/14/2017)  . Atrial fibrillation, persistent ([place]) 09/19/2014  . CAD (coronary artery disease), native coronary artery    Cath 2011 LHC (08/2009):~ Proximal LAD 30%, mid to distal LAD 25%, ostial small D1 75% mid AV groove circumflex 99% been subtotal stenosis, proximal to mid RCA 25-30%, mid RCA 30%, mid PDA 30%, EF 50% with inferior hypokinesis.;    July 2011  PCI and DES to circumflex Dr. Olevia Perches   ETT-Myoview (06/2013):  Inferolateral scar, mild peri-infarct ischemia, EF 40%; Intermediate Risk   ECHO EF 55% 03/2013   . CAD (coronary artery disease), native coronary artery 04/05/2015   Cath 2011 [facility] (08/2009):~ Proximal LAD 30%, mid to distal LAD 25%, ostial small D1 75% mid AV groove circumflex 99% been subtotal stenosis, proximal to mid RCA 25-30%, mid RCA 30%, mid PDA 30%, EF 50% with inferior hypokinesis.;    July 2011  PCI and DES to circumflex Dr. Olevia Perches   ETT-Myoview (06/2013):  Inferolateral scar, mild peri-infarct ischemia, EF 40%; Intermediate Risk   ECHO EF 55% 03/2013    . Cardiac pacemaker in situ 03/14/2017   Biventricular St. Jude inserted 03/14/17 Dr. Lovena Le for second degree heart block   . Chronic combined systolic and diastolic heart failure ([place]) 11/19/2017  . CKD (chronic kidney disease), stage III ([place])   . Gastro-esophageal reflux disease without esophagitis 09/24/2009  . GERD   . History  of infection of prosthetic knee 04/05/2015   Treated with debridement and irrigation followed by 6 months of triple antibiotics  . History of kidney stones   . History of peptic ulcer 1970s?  Marland Kitchen History of prostate cancer    Radical prostatectomy in 2006 Dr. Terance Hart   . Hypertensive heart disease   . Internal hemorrhoids 09/11/2018  . Long term (current) use of anticoagulants 10/06/2011  . Mixed hyperlipidemia   . Moderate persistent asthma without complication 05/25/3788  . Noise effect on both inner ears 08/14/2018  . Obesity (BMI 30-39.9)   . OSA on CPAP   . OSA treated with BiPAP 11/15/2015  . Persistent atrial fibrillation ([place]) 09/19/2014   CHA2DS2VASC score 5  Cardioversion 10/08/2014 was on amiodarone until 2018   . 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)    Initial DVT in 2006 after prostate surgery and had [place] filter placed Bilateral  PE 2013  and placed back on warfarin DVT of right subclavian vein on doppler 10/2012 at time of knee infection   . Personal history of malignant neoplasm of prostate    Radical prostatectomy in 2006 Dr. Terance Hart    . Presbycusis of both ears 08/14/2018  . Severe aortic stenosis   . Severe aortic stenosis 03/18/2018  . Urinary incontinence    MULTIPLE BLADDER SURGERIES - STATES NO URINARY SPHINCTER - PT'S UROLOGIST IS AT [facility]- DR. PETERSON  ( LAST VISIT WAS 09/15/11 )   Social History   Socioeconomic History  . Marital status: Married    Spouse name: Not on file  . Number of children: 3  . Years of education: Not on file  . Highest education level: Not on file  Occupational History  . Occupation: Retired Therapist, nutritional  Tobacco Use  . Smoking status: Former Smoker    Packs/day: 1.00    Years: 10.00    Pack years: 10.00    Types: Cigarettes    Quit date: 04/27/1961    Years since quitting: 59.1  . Smokeless tobacco: Never Used  Vaping Use  . Vaping Use: Never used  Substance and Sexual Activity  . Alcohol use:  No    Alcohol/week: 0.0 standard drinks  . Drug use: No  . Sexual activity: Not Currently  Other Topics Concern  . Not on file  Social History Narrative  . Not on file   Social Determinants of Health   Financial Resource Strain: Not on file  Food Insecurity: Not on file  Transportation Needs: Not on file  Physical Activity: Not on file  Stress: Not on file  Social Connections: Not on file   Family History  Problem Relation Age of Onset  . Melanoma Father   . Melanoma Brother    Scheduled Meds: . isosorbide mononitrate  15 mg Oral Daily  . pantoprazole  40 mg Oral Q0600  . polyethylene glycol  34 g Oral BID  . pravastatin  20 mg Oral Daily  . sodium chloride flush  5 mL Intracatheter Q8H  . vitamin B-12  500 mcg Oral Daily  . Warfarin - Pharmacist Dosing Inpatient   Does not apply q1600   Continuous Infusions: . sodium chloride 10 mL/hr at 05/22/20 1840  . meropenem (MERREM) IV 1 g (05/24/20 0120)   PRN Meds:.acetaminophen, haloperidol lactate, HYDROcodone-acetaminophen Medications Prior to Admission:  Prior to Admission medications   Medication Sig Start Date End Date Taking? Authorizing Provider  acetaminophen (TYLENOL) 500 MG tablet Take 500 mg by mouth at bedtime.    Yes [provider]  amLODipine (NORVASC) 2.5 MG tablet Take 1 tablet (2.5 mg total) by mouth daily. 09/03/19  Yes Tobb, Kardie, DO  Ascorbic Acid (VITAMIN C) 1000 MG tablet Take 1,000 mg by mouth daily.   Yes [provider]  Biotin 2500 MCG CAPS Take 2,500 mcg by mouth daily.   Yes [provider]  cholecalciferol (VITAMIN D) 1000 UNITS tablet Take 1,000 Units by mouth daily.   Yes [provider]  docusate sodium (COLACE) 50 MG capsule Take 50 mg by mouth daily.   Yes [provider]  furosemide (LASIX) 20 MG tablet Take 20 mg by mouth daily.   Yes [provider]  Glucosamine HCl (GLUCOSAMINE PO) Take 2,000 mg by mouth daily.   Yes [provider]  isosorbide mononitrate (IMDUR) 30 MG 24 hr tablet Take 0.5 tablets (15 mg total) by mouth daily. 02/05/20  Yes Shirlee More  J, MD  Multiple Vitamin (MULTIVITAMIN) capsule Take 1 capsule by mouth daily.  04/28/19  Yes [provider]  nitroGLYCERIN (NITROSTAT) 0.4 MG SL tablet Place 1 tablet (0.4 mg total) under the tongue every 5 (five) minutes as needed. 07/14/19  Yes Richardo Priest, MD  Omega-3 Fatty Acids (FISH OIL) 1000 MG CAPS Take 1,000 mg by mouth daily.   Yes [provider]  omeprazole (PRILOSEC) 20 MG capsule Take 1 capsule (20 mg total) by mouth daily. 04/21/20  Yes Richardo Priest, MD  pravastatin (PRAVACHOL) 20 MG tablet TAKE ONE TABLET BY MOUTH ONCE DAILY Patient taking differently: Take 20 mg by mouth daily. 01/08/20  Yes Richardo Priest, MD  vitamin B-12 (CYANOCOBALAMIN) 500 MCG tablet Take 500 mcg by mouth daily.   Yes [provider]  warfarin (COUMADIN) 2.5 MG tablet Take 1 tablet daily or as directed by [facility] Patient taking differently: Take 2.5 mg by mouth daily. 04/16/20  Yes Richardo Priest, MD  cefpodoxime (VANTIN) 200 MG tablet Take 1 tablet (200 mg total) by mouth 2 (two) times daily. Patient not taking: No sig reported 04/23/20   Molpus, Jenny Reichmann, MD   Allergies  Allergen Reactions  . Darifenacin Nausea Only and Other (See Comments)    dry mouth and dizziness ENABLEX Dizziness Pt denies  . Codeine Other (See Comments)  . Sulfamethoxazole-Trimethoprim Other (See Comments)  . Lisinopril Cough    Pt denies   Review of Systems + weakness.   Physical Exam Weak appearing elderly gentleman resting in bed in Has IR placed a drain Regular work of breathing Appears chronically ill and with generalized weakness No edema  Vital Signs: BP (!) 117/55 (BP Location: Left Arm)   Pulse 64   Temp 98 F (36.7 C) (Oral)   Resp 20   Ht 6' (1.829 m)   Wt 97.1 kg   SpO2 98%   BMI 29.02 kg/m  Pain Scale: 0-10 POSS *See Group  Information*: 1-Acceptable,Awake and alert Pain Score: 0-No pain   SpO2: SpO2: 98 % O2 Device:SpO2: 98 % O2 Flow Rate: .O2 Flow Rate (L/min): 4 L/min  IO: Intake/output summary:   Intake/Output Summary (Last 24 hours) at 05/24/2020 1134 Last data filed at 05/24/2020 0916 Gross per 24 hour  Intake 361 ml  Output 345 ml  Net 16 ml    LBM: Last BM Date: 05/23/20 Baseline Weight: Weight: 97.1 kg Most recent weight: Weight: 97.1 kg     Palliative Assessment/Data:   PPS 40%  Time In:  10.30 Time Out:  11.30 Time Total:  60 min.  Greater than 50%  of this time was spent counseling and coordinating care related to the above assessment and plan.  Signed by: Loistine Chance, MD   Please contact Palliative Medicine Team phone at [phone] for questions and concerns.  For individual provider: See Shea Evans

## 2020-05-24 NOTE — Progress Notes (Addendum)
Patient became increasingly agitated, refusing care and medications, holding his right hand in a fist and threatening to hit staff. Wife, Juliene Pina, was called but patient did not recall his wife and stated his name was not [name] and we should address him as [name]". Patient was redirected several times but redirection was not successful. Order for wrist restraints obtained and applied.

## 2020-05-24 NOTE — Progress Notes (Signed)
Physical Therapy Treatment Patient Details Name: [name] MRN: [id number] DOB: 1931-05-16 Today's Date: 05/24/2020    History of Present Illness 87 y.o. male admitted on 05/15/20 who presents with concerns of rectal pain, found to have perirectal abscess, s/p I &D 05/16/20 and s/p 87F drain R renal subcapsular fluid collection 05/18/20.Marland Kitchen Also found to have 6g drop in hgb with heme pos stools, suspect GIB. 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    PT Comments    Pt assisted with ambulating in hallway and spouse also present and provided encouragement and cues.   Spouse anticipates taking pt home upon d/c.   Follow Up Recommendations  Home health PT;Supervision/Assistance - 24 hour     Equipment Recommendations  None recommended by PT    Recommendations for Other Services       Precautions / Restrictions Precautions Precautions: Fall;Other (comment) Precaution Comments: urinary incontinence, R JP drain    Mobility  Bed Mobility Overal bed mobility: Needs Assistance Bed Mobility: Supine to Sit;Sit to Supine     Supine to sit: Mod assist Sit to supine: Min assist   General bed mobility comments: assisted with trunk upright and scooting to EOB, assist for LEs onto bed    Transfers Overall transfer level: Needs assistance Equipment used: Rolling walker (2 wheeled) Transfers: Sit to/from Stand Sit to Stand: Min assist;From elevated surface         General transfer comment: assist to rise and steady, cues for hand placement, pt able to control descent  Ambulation/Gait Ambulation/Gait assistance: Min assist Gait Distance (Feet): 80 Feet Assistive device: Rolling walker (2 wheeled) Gait Pattern/deviations: Decreased  stride length;Step-through pattern;Trunk flexed Gait velocity: decr   General Gait Details: assist for stability and maneuvering RW, cues for remaining in RW and posture (typically ambulates with rollator with seat)   Stairs             Wheelchair Mobility    Modified Rankin (Stroke Patients Only)       Balance                                            Cognition Arousal/Alertness: Awake/alert Behavior During Therapy: WFL for tasks assessed/performed Overall Cognitive Status: Impaired/Different from baseline                       Memory: Decreased short-term memory Following Commands: Follows one step commands with increased time Safety/Judgement: Decreased awareness of safety;Decreased awareness of deficits   Problem Solving: Slow processing        Exercises      General Comments        Pertinent Vitals/Pain Pain Assessment: No/denies pain    Home Living                      Prior Function            PT Goals (current goals can now be found in the care plan section) Progress towards PT goals: Progressing toward goals    Frequency    Min 3X/week      PT Plan Current plan remains appropriate    Co-evaluation  AM-PAC PT "6 Clicks" Mobility   Outcome Measure  Help needed turning from your back to your side while in a flat bed without using bedrails?: A Little Help needed moving from lying on your back to sitting on the side of a flat bed without using bedrails?: A Lot Help needed moving to and from a bed to a chair (including a wheelchair)?: A Lot Help needed standing up from a chair using your arms (e.g., wheelchair or bedside chair)?: A Lot Help needed to walk in hospital room?: A Lot Help needed climbing 3-5 steps with a railing? : Total 6 Click Score: 12    End of Session Equipment Utilized During Treatment: Gait belt Activity Tolerance: Patient limited by fatigue Patient left:  in bed;with call bell/phone within reach;with bed alarm set;with family/visitor present Nurse Communication: Mobility status PT Visit Diagnosis: Muscle weakness (generalized) (M62.81);Difficulty in walking, not elsewhere classified (R26.2);Pain     Time: 5465-0354 PT Time Calculation (min) (ACUTE ONLY): 23 min  Charges:  $Gait Training: 8-22 mins                     Jannette Spanner PT, DPT Acute Rehabilitation Services Pager: [phone] Office: [phone]  Trena Platt 05/24/2020, 2:34 PM

## 2020-05-24 NOTE — Progress Notes (Addendum)
Patient noted to be pleasant and cooperative this AM. Patient AOx3/4 (forgetful)  and able to follow commands. Diversional activity provided to patient and this RN provided reassurance. Bilateral soft wrist restraints removed. MD [place] Silos notified and aware. Wife at bedside.  Will continue to monitor.

## 2020-05-24 NOTE — Progress Notes (Signed)
PT Cancellation Note  Patient Details Name: DEMARQUIS OSLEY MRN: [id number] DOB: [birth year]   Cancelled Treatment:    Reason Eval/Treat Not Completed: Patient at procedure or test/unavailable Will check back as schedule permits.   Cieanna Stormes,KATHrine E 05/24/2020, 10:43 AM Jannette Spanner PT, DPT Acute Rehabilitation Services Pager: [phone] Office: [phone]

## 2020-05-24 NOTE — Progress Notes (Signed)
OT Cancellation Note  Patient Details Name: DEVION CHRISCOE MRN: [id number] DOB: 1931/06/18   Cancelled Treatment:    Reason Eval/Treat Not Completed: Patient at procedure or test/ unavailable (Will return)  Malka So 05/24/2020, 9:57 AM  Nestor Lewandowsky, OTR/L Acute Rehabilitation Services Pager: [phone] Office: [phone]

## 2020-05-24 NOTE — Consult Note (Addendum)
Urology Consult   Physician requesting consult: Irine Seal  Reason for consult: Right perinephric abscess  History of Present Illness: [name] is a 87 y.o. male with PMH significant for CaP s/p prostatectomy 2006, Artficial urinary sphincter in 2007, Afib, recurrent DVT, PE s/p IVC filter on coumadin, aortic stenosis s/p TAVR, CHF s/p pacemaker, and CKD III who was admitted for eval and treatment of perirectal and perinephric abscesses. Perirectal abscess was drained on 05/16/20. A right renal subcapsular abscess drain was placed by IR on 05/18/20.  Cultures from both areas revealed ESBL E. Coli.  ID has pt on IV Merrem. Pt has continued to improve on ABx and post procedures. He is currently AF with WBC 10, and Cr 1.17.  CT scan today shows decrease in subcapsular abscess with drain in place.   Pt and his wife states his sx began approx 1 month ago with dysuria.  He was evaled at an Urgent care with UA only and given ABx and pain medication.  Pt followed up with his PCP several days later and his sx were not improving.  Abx was changed again with no improvement.  Pt then developed fevers up to 102 which prompted visit to ED.  CT revealed both abscesses and pt was admitted. He has a hx of prostate cancer and is followed routinely by Dr. Burnell Blanks in [facility]. He denies any hx of UTIs and pyelonephritis prior to this episode.   He is currently resting comfortably and is without complaint. He denies F/C, HA, CP, SOB, N/V, diarrhea/constipation, and difficulty voiding.   Past Medical History:  Diagnosis Date  . Aortic valve stenosis 04/05/2015   Formatting of this note might be different from the original. Overview:  ECHO 5/15 Formatting of this note might be different from the original. Overview:  Overview:  ECHO 5/15  . Arthritis    "knees, right shoulder" (03/14/2017)  . Atrial fibrillation, persistent ([place]) 09/19/2014  . CAD (coronary artery disease), native coronary artery    Cath  2011 LHC (08/2009):~ Proximal LAD 30%, mid to distal LAD 25%, ostial small D1 75% mid AV groove circumflex 99% been subtotal stenosis, proximal to mid RCA 25-30%, mid RCA 30%, mid PDA 30%, EF 50% with inferior hypokinesis.;    July 2011  PCI and DES to circumflex Dr. Olevia Perches   ETT-Myoview (06/2013):  Inferolateral scar, mild peri-infarct ischemia, EF 40%; Intermediate Risk   ECHO EF 55% 03/2013   . CAD (coronary artery disease), native coronary artery 04/05/2015   Cath 2011 LHC (08/2009):~ Proximal LAD 30%, mid to distal LAD 25%, ostial small D1 75% mid AV groove circumflex 99% been subtotal stenosis, proximal to mid RCA 25-30%, mid RCA 30%, mid PDA 30%, EF 50% with inferior hypokinesis.;    July 2011  PCI and DES to circumflex Dr. Olevia Perches   ETT-Myoview (06/2013):  Inferolateral scar, mild peri-infarct ischemia, EF 40%; Intermediate Risk   ECHO EF 55% 03/2013    . Cardiac pacemaker in situ 03/14/2017   Biventricular St. Jude inserted 03/14/17 Dr. Lovena Le for second degree heart block   . Chronic combined systolic and diastolic heart failure ([place]) 11/19/2017  . CKD (chronic kidney disease), stage III ([place])   . Gastro-esophageal reflux disease without esophagitis 09/24/2009  . GERD   . History of infection of prosthetic knee 04/05/2015   Treated with debridement and irrigation followed by 6 months of triple antibiotics  . History of kidney stones   . History of peptic ulcer 1970s?  Marland Kitchen  History of prostate cancer    Radical prostatectomy in 2006 Dr. Terance Hart   . Hypertensive heart disease   . Internal hemorrhoids 09/11/2018  . Long term (current) use of anticoagulants 10/06/2011  . Mixed hyperlipidemia   . Moderate persistent asthma without complication 0/35/0093  . Noise effect on both inner ears 08/14/2018  . Obesity (BMI 30-39.9)   . OSA on CPAP   . OSA treated with BiPAP 11/15/2015  . Persistent atrial fibrillation ([place]) 09/19/2014   CHA2DS2VASC score 5  Cardioversion 10/08/2014 was on amiodarone until 2018   .  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)    Initial DVT in 2006 after prostate surgery and had [place] filter placed Bilateral  PE 2013 and placed back on warfarin DVT of right subclavian vein on doppler 10/2012 at time of knee infection   . Personal history of malignant neoplasm of prostate    Radical prostatectomy in 2006 Dr. Terance Hart    . Presbycusis of both ears 08/14/2018  . Severe aortic stenosis   . Severe aortic stenosis 03/18/2018  . Urinary incontinence    MULTIPLE BLADDER SURGERIES - STATES NO URINARY SPHINCTER - PT'S UROLOGIST IS AT [facility]- DR. PETERSON  ( LAST VISIT WAS 09/15/11 )    Past Surgical History:  Procedure Laterality Date  . APPENDECTOMY    . BI-VENTRICULAR PACEMAKER INSERTION (CRT-P)  03/14/2017  . BIV PACEMAKER INSERTION CRT-P N/A 03/14/2017   Procedure: BIV PACEMAKER INSERTION CRT-P;  Surgeon: Evans Lance, MD;  Location: [place] [facility];  Service: Cardiovascular;  Laterality: N/A;  . CARDIOVERSION N/A 10/08/2014   Procedure: CARDIOVERSION;  Surgeon: Jacolyn Reedy, MD;  Location: [facility];  Service: Cardiovascular;  Laterality: N/A;  . CATARACT EXTRACTION W/ INTRAOCULAR LENS  IMPLANT, BILATERAL Bilateral   . CORONARY ANGIOPLASTY WITH STENT PLACEMENT  08/25/2009   DES-mid LCx 08/2009; 30% pLAD, 25% m/dLAD, 75% ostial D1, 99% mLCx s/p DES, 25-30% p/mRCA, 40% mRCA, 30% mPDA stenoses; LVEF 50%, inf hypokinesis  . CORONARY STENT INTERVENTION N/A 03/18/2018   Procedure: CORONARY STENT INTERVENTION;  Surgeon: Sherren Mocha, MD;  Location: [place] [facility];  Service: Cardiovascular;  Laterality: N/A;  . EXCISIONAL HEMORRHOIDECTOMY    . I & D KNEE WITH POLY EXCHANGE Left 10/09/2012   Procedure: IRRIGATION AND DEBRIDEMENT LEFT KNEE WITH POLY REVISION;  Surgeon: Gearlean Alf, MD;  Location: [facility];  Service: Orthopedics;  Laterality: Left;  . INCISION AND DRAINAGE ABSCESS N/A 05/16/2020   Procedure: INCISION AND DRAINAGE  PERIRECTAL  ABSCESS;  Surgeon: Rolm Bookbinder, MD;  Location: [facility];  Service: General;  Laterality: N/A;  . INGUINAL HERNIA REPAIR Right   . INTRAOPERATIVE TRANSTHORACIC ECHOCARDIOGRAM  03/19/2018   Procedure: Intraoperative Transthoracic Echocardiogram;  Surgeon: Sherren Mocha, MD;  Location: [place];  Service: Open Heart Surgery;;  . JOINT REPLACEMENT    . KNEE ARTHROTOMY Left 10/09/2012   Procedure: LEFT KNEE ARTHROTOMY;  Surgeon: Gearlean Alf, MD;  Location: [facility];  Service: Orthopedics;  Laterality: Left;  . LEFT HEART CATH AND CORONARY ANGIOGRAPHY N/A 02/28/2018   Procedure: LEFT HEART CATH AND CORONARY ANGIOGRAPHY;  Surgeon: Sherren Mocha, MD;  Location: [place] [facility];  Service: Cardiovascular;  Laterality: N/A;  . LEFT HEART CATH AND CORONARY ANGIOGRAPHY N/A 09/12/2019   Procedure: LEFT HEART CATH AND CORONARY ANGIOGRAPHY;  Surgeon: Sherren Mocha, MD;  Location: [place] [facility];  Service: Cardiovascular;  Laterality: N/A;  . PROSTATECTOMY  04/25/2004  .  REPLACEMENT TOTAL KNEE Bilateral   . SKIN BIOPSY     "off nose; wasn't cancer; it was tested" (03/14/2017)  . TRANSCATHETER AORTIC VALVE REPLACEMENT, TRANSFEMORAL N/A 03/19/2018   Procedure: TRANSCATHETER AORTIC VALVE REPLACEMENT, TRANSFEMORAL;  Surgeon: Sherren Mocha, MD;  Location: [place];  Service: Open Heart Surgery;  Laterality: N/A;  . Uretheral implants     multiple for incontinence  . VENA CAVA FILTER PLACEMENT      Current Hospital Medications:  Home Meds:  . Current Meds  Medication Sig  . acetaminophen (TYLENOL) 500 MG tablet Take 500 mg by mouth at bedtime.   Marland Kitchen amLODipine (NORVASC) 2.5 MG tablet Take 1 tablet (2.5 mg total) by mouth daily.  . Ascorbic Acid (VITAMIN C) 1000 MG tablet Take 1,000 mg by mouth daily.  . Biotin 2500 MCG CAPS Take 2,500 mcg by mouth daily.  . cholecalciferol (VITAMIN D) 1000 UNITS tablet Take 1,000 Units by mouth daily.  Marland Kitchen docusate sodium (COLACE) 50 MG capsule Take 50 mg by mouth daily.   . furosemide (LASIX) 20 MG tablet Take 20 mg by mouth daily.  . Glucosamine HCl (GLUCOSAMINE PO) Take 2,000 mg by mouth daily.  . isosorbide mononitrate (IMDUR) 30 MG 24 hr tablet Take 0.5 tablets (15 mg total) by mouth daily.  . Multiple Vitamin (MULTIVITAMIN) capsule Take 1 capsule by mouth daily.   . nitroGLYCERIN (NITROSTAT) 0.4 MG SL tablet Place 1 tablet (0.4 mg total) under the tongue every 5 (five) minutes as needed.  . Omega-3 Fatty Acids (FISH OIL) 1000 MG CAPS Take 1,000 mg by mouth daily.  Marland Kitchen omeprazole (PRILOSEC) 20 MG capsule Take 1 capsule (20 mg total) by mouth daily.  . pravastatin (PRAVACHOL) 20 MG tablet TAKE ONE TABLET BY MOUTH ONCE DAILY (Patient taking differently: Take 20 mg by mouth daily.)  . vitamin B-12 (CYANOCOBALAMIN) 500 MCG tablet Take 500 mcg by mouth daily.  Marland Kitchen warfarin (COUMADIN) 2.5 MG tablet Take 1 tablet daily or as directed by [facility] (Patient taking differently: Take 2.5 mg by mouth daily.)     Scheduled Meds: . isosorbide mononitrate  15 mg Oral Daily  . pantoprazole  40 mg Oral Q0600  . polyethylene glycol  34 g Oral BID  . pravastatin  20 mg Oral Daily  . sodium chloride flush  10-40 mL Intracatheter Q12H  . sodium chloride flush  5 mL Intracatheter Q8H  . vitamin B-12  500 mcg Oral Daily  . warfarin  2.5 mg Oral ONCE-1600  . Warfarin - Pharmacist Dosing Inpatient   Does not apply q1600   Continuous Infusions: . sodium chloride 10 mL/hr at 05/22/20 1840  . meropenem (MERREM) IV 1 g (05/24/20 1350)   PRN Meds:.acetaminophen, haloperidol lactate, HYDROcodone-acetaminophen, QUEtiapine, sodium chloride flush  Allergies:  Allergies  Allergen Reactions  . Darifenacin Nausea Only and Other (See Comments)    dry mouth and dizziness ENABLEX Dizziness Pt denies  . Codeine Other (See Comments)  . Sulfamethoxazole-Trimethoprim Other (See Comments)  . Lisinopril Cough    Pt denies    Family History  Problem Relation Age of Onset  .  Melanoma Father   . Melanoma Brother     Social History:  reports that he quit smoking about 59 years ago. His smoking use included cigarettes. He has a 10.00 pack-year smoking history. He has never used smokeless tobacco. He reports that he does not drink alcohol and does not use drugs.  ROS: A complete review of systems was performed.  All  systems are negative except for pertinent findings as noted.  Physical Exam:  Vital signs in last 24 hours: Temp:  [98.6 F (37 C)] 98.6 F (37 C) (04/18 1346) Pulse Rate:  [62] 62 (04/18 1346) Resp:  [16] 16 (04/18 1346) BP: (128)/(64) 128/64 (04/18 1346) SpO2:  [98 %] 98 % (04/18 1346) Constitutional:  Alert and oriented, No acute distress Cardiovascular: irr irr Respiratory: Normal respiratory effort GI: Abdomen is soft, nontender, nondistended, no abdominal masses GU: right renal drain in place with purulent output Lymphatic: No lymphadenopathy Neurologic: Grossly intact, no focal deficits Psychiatric: Normal mood and affect  Laboratory Data:  Recent Labs    05/22/20 0512 05/23/20 0711 05/24/20 0829  WBC 10.7* 10.2 10.1  HGB 11.1* 10.8* 11.3*  HCT 35.2* 34.0* 35.2*  PLT 343 351 319    Recent Labs    05/22/20 0512 05/23/20 0711 05/24/20 0829  NA 133* 134* 137  K 4.2 4.6 4.3  CL 102 102 102  GLUCOSE 117* 122* 182*  BUN 23 21 20   CALCIUM 9.2 9.5 9.7  CREATININE 1.07 1.11 1.17     Results for orders placed or performed during the hospital encounter of 05/15/20 (from the past 24 hour(s))  Protime-INR     Status: Abnormal   Collection Time: 05/24/20  8:29 AM  Result Value Ref Range   Prothrombin Time 25.9 (H) 11.4 - 15.2 seconds   INR 2.4 (H) 0.8 - 1.2  CBC with Differential/Platelet     Status: Abnormal   Collection Time: 05/24/20  8:29 AM  Result Value Ref Range   WBC 10.1 4.0 - 10.5 K/uL   RBC 4.37 4.22 - 5.81 MIL/uL   Hemoglobin 11.3 (L) 13.0 - 17.0 g/dL   HCT 35.2 (L) 39.0 - 52.0 %   MCV 80.5 80.0 - 100.0 fL    MCH 25.9 (L) 26.0 - 34.0 pg   MCHC 32.1 30.0 - 36.0 g/dL   RDW 18.9 (H) 11.5 - 15.5 %   Platelets 319 150 - 400 K/uL   nRBC 0.0 0.0 - 0.2 %   Neutrophils Relative % 77 %   Neutro Abs 7.9 (H) 1.7 - 7.7 K/uL   Lymphocytes Relative 11 %   Lymphs Abs 1.1 0.7 - 4.0 K/uL   Monocytes Relative 8 %   Monocytes Absolute 0.8 0.1 - 1.0 K/uL   Eosinophils Relative 1 %   Eosinophils Absolute 0.1 0.0 - 0.5 K/uL   Basophils Relative 1 %   Basophils Absolute 0.1 0.0 - 0.1 K/uL   Immature Granulocytes 2 %   Abs Immature Granulocytes 0.15 (H) 0.00 - 0.07 K/uL  Basic metabolic panel     Status: Abnormal   Collection Time: 05/24/20  8:29 AM  Result Value Ref Range   Sodium 137 135 - 145 mmol/L   Potassium 4.3 3.5 - 5.1 mmol/L   Chloride 102 98 - 111 mmol/L   CO2 27 22 - 32 mmol/L   Glucose, Bld 182 (H) 70 - 99 mg/dL   BUN 20 8 - 23 mg/dL   Creatinine, Ser 1.17 0.61 - 1.24 mg/dL   Calcium 9.7 8.9 - 10.3 mg/dL   GFR, Estimated >60 >60 mL/min   Anion gap 8 5 - 15   Recent Results (from the past 240 hour(s))  Resp Panel by RT-PCR (Flu A&B, Covid) Nasopharyngeal Swab     Status: None   Collection Time: 05/16/20  8:06 AM   Specimen: Nasopharyngeal Swab; Nasopharyngeal(NP) swabs in vial transport medium  Result Value Ref Range Status   SARS Coronavirus 2 by RT PCR NEGATIVE NEGATIVE Final    Comment: (NOTE) SARS-CoV-2 target nucleic acids are NOT DETECTED.  The SARS-CoV-2 RNA is generally detectable in upper respiratory specimens during the acute phase of infection. The lowest concentration of SARS-CoV-2 viral copies this assay can detect is 138 copies/mL. A negative result does not preclude SARS-Cov-2 infection and should not be used as the sole basis for treatment or other patient management decisions. A negative result may occur with  improper specimen collection/handling, submission of specimen other than nasopharyngeal swab, presence of viral mutation(s) within the areas targeted by this  assay, and inadequate number of viral copies(<138 copies/mL). A negative result must be combined with clinical observations, patient history, and epidemiological information. The expected result is Negative.  Fact Sheet for Patients:  EntrepreneurPulse.com.au  Fact Sheet for Healthcare Providers:  IncredibleEmployment.be  This test is no t yet approved or cleared by the [place] FDA and  has been authorized for detection and/or diagnosis of SARS-CoV-2 by FDA under an Emergency Use Authorization (EUA). This EUA will remain  in effect (meaning this test can be used) for the duration of the COVID-19 declaration under Section 564(b)(1) of the Act, 21 U.S.C.section 360bbb-3(b)(1), unless the authorization is terminated  or revoked sooner.       Influenza A by PCR NEGATIVE NEGATIVE Final   Influenza B by PCR NEGATIVE NEGATIVE Final    Comment: (NOTE) The Xpert Xpress SARS-CoV-2/FLU/RSV plus assay is intended as an aid in the diagnosis of influenza from Nasopharyngeal swab specimens and should not be used as a sole basis for treatment. Nasal washings and aspirates are unacceptable for Xpert Xpress SARS-CoV-2/FLU/RSV testing.  Fact Sheet for Patients: EntrepreneurPulse.com.au  Fact Sheet for Healthcare Providers: IncredibleEmployment.be  This test is not yet approved or cleared by the [place] FDA and has been authorized for detection and/or diagnosis of SARS-CoV-2 by FDA under an Emergency Use Authorization (EUA). This EUA will remain in effect (meaning this test can be used) for the duration of the COVID-19 declaration under Section 564(b)(1) of the Act, 21 U.S.C. section 360bbb-3(b)(1), unless the authorization is terminated or revoked.  Performed at [facility], [place] [address], [place], [place] [address]   Aerobic/Anaerobic Culture w Gram Stain (surgical/deep wound)      Status: None   Collection Time: 05/16/20 11:22 AM   Specimen: PATH Other; Tissue  Result Value Ref Range Status   Specimen Description   Final    TISSUE Performed at [place] [address], [place], [place] [address]    Special Requests   Final    NONE Performed at [facility], [place] [address], [place], [place] [address]    Gram Stain   Final    MODERATE WBC PRESENT, PREDOMINANTLY PMN FEW GRAM POSITIVE COCCI IN PAIRS MODERATE GRAM NEGATIVE RODS    Culture   Final    ABUNDANT ESCHERICHIA COLI Confirmed Extended Spectrum Beta-Lactamase Producer (ESBL).  In bloodstream infections from ESBL organisms, carbapenems are preferred over piperacillin/tazobactam. They are shown to have a lower risk of mortality. NO ANAEROBES ISOLATED Performed at [facility], [place] [address], [place], [place] [address]    Report Status 05/21/2020 FINAL  Final   Organism ID, Bacteria ESCHERICHIA COLI  Final      Susceptibility   Escherichia coli - MIC*    AMPICILLIN >=32 RESISTANT Resistant     CEFAZOLIN >=64 RESISTANT  Resistant     CEFEPIME 8 INTERMEDIATE Intermediate     CEFTAZIDIME RESISTANT Resistant     CEFTRIAXONE >=64 RESISTANT Resistant     CIPROFLOXACIN <=0.25 SENSITIVE Sensitive     GENTAMICIN <=1 SENSITIVE Sensitive     IMIPENEM <=0.25 SENSITIVE Sensitive     TRIMETH/SULFA <=20 SENSITIVE Sensitive     AMPICILLIN/SULBACTAM >=32 RESISTANT Resistant     PIP/TAZO 8 SENSITIVE Sensitive     * ABUNDANT ESCHERICHIA COLI  Aerobic/Anaerobic Culture (surgical/deep wound)     Status: None   Collection Time: 05/18/20 12:01 PM   Specimen: Abscess  Result Value Ref Range Status   Specimen Description   Final    ABSCESS RT KIDNEY Performed at [place] [address], [place], [place] [address]    Special Requests   Final    Normal Performed at [facility], [place] [address], [place], [place] [address]    Gram  Stain   Final    ABUNDANT WBC PRESENT,BOTH PMN AND MONONUCLEAR FEW GRAM NEGATIVE RODS    Culture   Final    ABUNDANT ESCHERICHIA COLI Confirmed Extended Spectrum Beta-Lactamase Producer (ESBL).  In bloodstream infections from ESBL organisms, carbapenems are preferred over piperacillin/tazobactam. They are shown to have a lower risk of mortality. NO ANAEROBES ISOLATED Performed at [facility], [place] [address], [place],  [address]    Report Status 05/23/2020 FINAL  Final   Organism ID, Bacteria ESCHERICHIA COLI  Final      Susceptibility   Escherichia coli - MIC*    AMPICILLIN >=32 RESISTANT Resistant     CEFAZOLIN >=64 RESISTANT Resistant     CEFEPIME 8 INTERMEDIATE Intermediate     CEFTAZIDIME RESISTANT Resistant     CEFTRIAXONE >=64 RESISTANT Resistant     CIPROFLOXACIN <=0.25 SENSITIVE Sensitive     GENTAMICIN <=1 SENSITIVE Sensitive     IMIPENEM <=0.25 SENSITIVE Sensitive     TRIMETH/SULFA <=20 SENSITIVE Sensitive     AMPICILLIN/SULBACTAM >=32 RESISTANT Resistant     PIP/TAZO 8 SENSITIVE Sensitive     * ABUNDANT ESCHERICHIA COLI    Renal Function: Recent Labs    05/18/20 0449 05/19/20 0515 05/20/20 0517 05/21/20 0517 05/22/20 0512 05/23/20 0711 05/24/20 0829  CREATININE 1.22 1.40* 1.38* 1.38* 1.07 1.11 1.17   Estimated Creatinine Clearance: 53.7 mL/min (by C-G formula based on SCr of 1.17 mg/dL).  Radiologic Imaging: CLINICAL DATA: Painful bowel movement with rectal bleeding.  EXAM: CT ABDOMEN AND PELVIS WITH CONTRAST  TECHNIQUE: Multidetector CT imaging of the abdomen and pelvis was performed using the standard protocol following bolus administration of intravenous contrast.  CONTRAST: 78mL OMNIPAQUE IOHEXOL 300 MG/ML SOLN  COMPARISON: April 22, 2020  FINDINGS: Lower chest: Mild areas of atelectasis are seen within the bilateral lung bases.  There is an artificial aortic valve.  A small pericardial effusion is noted.  Hepatobiliary: No  focal liver abnormality is seen. Subcentimeter gallstones are seen within the lumen of an otherwise normal-appearing gallbladder.  Pancreas: Unremarkable. No pancreatic ductal dilatation or surrounding inflammatory changes.  Spleen: Normal in size without focal abnormality.  Adrenals/Urinary Tract: Adrenal glands are unremarkable. A 1.5 cm x 1.3 cm cyst is seen within the lateral aspect of the mid left kidney. Renal cortical thinning is again seen on the right. A 7.1 cm x 4.7 cm lobulated area of low attenuation (approximately 22.12 Hounsfield units) is seen within the anterior aspect of the right kidney. This is seen as an area of  subcapsular fluid on the prior study and is increased in size. Additional areas of lobulated low-attenuation are noted within the mid and upper right kidney. This is present on the prior study. A 17 mm nonobstructing renal stone is again seen within the lower pole of the right kidney. Additional smaller renal versus vascular calcifications are noted within the mid right kidney. Mild right-sided perinephric inflammatory fat stranding is seen. A mild amount of contrast is seen within the dependent portion of a normal appearing urinary bladder.  Stomach/Bowel: Stomach is within normal limits. The appendix is not identified. No evidence of bowel dilatation.  Vascular/Lymphatic: Aortic atherosclerosis. And inferior vena cava filter is noted. No enlarged abdominal or pelvic lymph nodes.  Reproductive: The prostate gland is surgically absent.  Other: A 4.7 cm x 3.1 cm collection of fluid and air, with thin surrounding mildly hyperdense rim, is seen within the posterior perirectal region on the left. This represents a new finding.  Musculoskeletal: Multilevel degenerative changes seen throughout the lumbar spine.  IMPRESSION: 1. 4.7 cm x 3.1 cm left posterior perirectal abscess. 2. Cholelithiasis. 3. Interval increase in size of the suspected right  subcapsular fluid collection since the prior exam with additional findings suggestive of right-sided acute pyelonephritis. MRI correlation is recommended. 4. Nonobstructing renal calculus within the right kidney. 5. Aortic atherosclerosis.  Aortic Atherosclerosis (ICD10-I70.0).   Electronically Signed By: Virgina Norfolk M.D. On: 05/15/2020 22:46   Impression/Recommendation Right subcapsular renal abscess--improving with drain and IV ABx.  Continue drain per IR.  ID will determine length of ABx therapy and if it can be switched to PO at d/c.  No other intervention necessary at this time.  Pt is established with urologist Dr. Burnell Blanks and should return to his care after d/c home.   Non obstructing right renal calculus--no intervention needed.  Monitor.   Debbrah Alar 05/24/2020, 4:25 PM

## 2020-05-24 NOTE — Progress Notes (Signed)
[name]ious [facility]  Date of Admission:  05/15/2020           Reason for visit: Follow up on perirectal and renal abscess  Current antibiotics: Meropenem (4/13--present)  Previous antibiotics: Piperacillin tazobactam (4/9--4/13)  ASSESSMENT:    1. Perirectal abscess 2/2 ESBL E coli s/p I&D 05/16/20 2. Pyelonephritis, right renal subscapular abscess 2/2 ESBL E coli s/p IR drain placement 05/18/20 3. Non-obstructing nephrolithiasis 4. CKD: improving, creatinine 1.17 this morning  PLAN:    . Continue meropenem . Await CT scan for re-evaluation of renal abscess . Contact precautions . Will follow   Principal Problem:   Perirectal abscess Active Problems:   Hypertensive heart disease   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)   Gastro-esophageal reflux disease without esophagitis   Long term (current) use of anticoagulants   Persistent atrial fibrillation (HCC)   CKD (chronic kidney disease), stage III (HCC)   S/P TAVR (transcatheter aortic valve replacement)   OSA on CPAP   CAD (coronary artery disease), native coronary artery   Atrial fibrillation, persistent (HCC)   Pyelonephritis of right kidney   Presence of IVC filter   Constipation, chronic   Cysts of right kidney   Renal abscess    MEDICATIONS:    Scheduled Meds: . isosorbide mononitrate  15 mg Oral Daily  . pantoprazole  40 mg Oral Q0600  . polyethylene glycol  34 g Oral BID  . pravastatin  20 mg Oral Daily  . sodium chloride flush  5 mL Intracatheter Q8H  . vitamin B-12  500 mcg Oral Daily  . Warfarin - Pharmacist Dosing Inpatient   Does not apply q1600   Continuous Infusions: . sodium chloride 10 mL/hr at 05/22/20 1840  . meropenem (MERREM) IV 1 g (05/24/20 0120)   PRN Meds:.acetaminophen, haloperidol lactate, HYDROcodone-acetaminophen  SUBJECTIVE:   24 hour events:  Pts wife reports he had a difficult night and did not sleep well.  Had to  have restraints placed and lost PIV access.  Going for CT scan this morning. Drain output measured as 20cc last 24 hrs  OBJECTIVE:   Blood pressure (!) 117/55, pulse 64, temperature 98 F (36.7 C), temperature source Oral, resp. rate 20, height 6' (1.829 m), weight 97.1 kg, SpO2 98 %. Body mass index is 29.02 kg/m.  Physical Exam Constitutional:      Comments: Alert, tired appearing, elderly man, lying in bed, NAD  HENT:     Head: Normocephalic and atraumatic.  Pulmonary:     Effort: Pulmonary effort is normal. No respiratory distress.  Genitourinary:    Comments: Right sided IR drain in place.  Skin:    General: Skin is warm and dry.     Findings: No rash.  Neurological:     General: No focal deficit present.  Psychiatric:        Mood and Affect: Mood normal.        Behavior: Behavior normal.      Lab Results: Lab Results  Component Value Date   WBC 10.1 05/24/2020   HGB 11.3 (L) 05/24/2020   HCT 35.2 (L) 05/24/2020   MCV 80.5 05/24/2020   PLT 319 05/24/2020    Lab Results  Component Value Date   NA 137 05/24/2020   K 4.3 05/24/2020   CO2 27 05/24/2020   GLUCOSE 182 (H) 05/24/2020   BUN 20 05/24/2020   CREATININE 1.17 05/24/2020   CALCIUM 9.7 05/24/2020  GFRNONAA >60 05/24/2020   GFRAA 51 (L) 09/08/2019    Lab Results  Component Value Date   ALT 8 05/19/2020   AST 16 05/19/2020   ALKPHOS 68 05/19/2020   BILITOT 1.1 05/19/2020       Component Value Date/Time   CRP <0.5 04/22/2013 1106       Component Value Date/Time   ESRSEDRATE 4 04/22/2013 1106     I have reviewed the micro and lab results in Epic.  Imaging: No results found.   Imaging  independently reviewed in Epic.    Raynelle Highland for Infectious Disease [facility] [phone] pager 05/24/2020, 11:09 AM

## 2020-05-25 DIAGNOSIS — N200 Calculus of kidney: Secondary | ICD-10-CM

## 2020-05-25 DIAGNOSIS — N1831 Chronic kidney disease, stage 3a: Secondary | ICD-10-CM | POA: Diagnosis not present

## 2020-05-25 DIAGNOSIS — N151 Renal and perinephric abscess: Secondary | ICD-10-CM | POA: Diagnosis not present

## 2020-05-25 DIAGNOSIS — K5909 Other constipation: Secondary | ICD-10-CM | POA: Diagnosis not present

## 2020-05-25 DIAGNOSIS — N10 Acute tubulo-interstitial nephritis: Secondary | ICD-10-CM

## 2020-05-25 DIAGNOSIS — I4819 Other persistent atrial fibrillation: Secondary | ICD-10-CM | POA: Diagnosis not present

## 2020-05-25 DIAGNOSIS — Z7189 Other specified counseling: Secondary | ICD-10-CM | POA: Diagnosis not present

## 2020-05-25 DIAGNOSIS — K611 Rectal abscess: Secondary | ICD-10-CM | POA: Diagnosis not present

## 2020-05-25 LAB — CBC WITH DIFFERENTIAL/PLATELET
Abs Immature Granulocytes: 0.11 10*3/uL — ABNORMAL HIGH (ref 0.00–0.07)
Basophils Absolute: 0.1 10*3/uL (ref 0.0–0.1)
Basophils Relative: 1 %
Eosinophils Absolute: 0.2 10*3/uL (ref 0.0–0.5)
Eosinophils Relative: 2 %
HCT: 34.5 % — ABNORMAL LOW (ref 39.0–52.0)
Hemoglobin: 11 g/dL — ABNORMAL LOW (ref 13.0–17.0)
Immature Granulocytes: 1 %
Lymphocytes Relative: 12 %
Lymphs Abs: 1.3 10*3/uL (ref 0.7–4.0)
MCH: 25.6 pg — ABNORMAL LOW (ref 26.0–34.0)
MCHC: 31.9 g/dL (ref 30.0–36.0)
MCV: 80.4 fL (ref 80.0–100.0)
Monocytes Absolute: 1 10*3/uL (ref 0.1–1.0)
Monocytes Relative: 9 %
Neutro Abs: 8.1 10*3/uL — ABNORMAL HIGH (ref 1.7–7.7)
Neutrophils Relative %: 75 %
Platelets: 355 10*3/uL (ref 150–400)
RBC: 4.29 MIL/uL (ref 4.22–5.81)
RDW: 19.1 % — ABNORMAL HIGH (ref 11.5–15.5)
WBC: 10.8 10*3/uL — ABNORMAL HIGH (ref 4.0–10.5)
nRBC: 0 % (ref 0.0–0.2)

## 2020-05-25 LAB — BASIC METABOLIC PANEL
Anion gap: 7 (ref 5–15)
BUN: 15 mg/dL (ref 8–23)
CO2: 26 mmol/L (ref 22–32)
Calcium: 9.4 mg/dL (ref 8.9–10.3)
Chloride: 104 mmol/L (ref 98–111)
Creatinine, Ser: 0.99 mg/dL (ref 0.61–1.24)
GFR, Estimated: >60 mL/min (ref >60)
Glucose, Bld: 113 mg/dL — ABNORMAL HIGH (ref 70–99)
Potassium: 4.2 mmol/L (ref 3.5–5.1)
Sodium: 137 mmol/L (ref 135–145)

## 2020-05-25 LAB — PROTIME-INR
INR: 2.9 — ABNORMAL HIGH (ref 0.8–1.2)
Prothrombin Time: 30.4 seconds — ABNORMAL HIGH (ref 11.4–15.2)

## 2020-05-25 MED ORDER — FUROSEMIDE 20 MG PO TABS
20.0000 mg | ORAL_TABLET | Freq: Every day | ORAL | Status: DC
  Administered 2020-05-25 – 2020-05-28 (×4): 20 mg via ORAL
  Filled 2020-05-25 (×4): qty 1

## 2020-05-25 MED ORDER — QUETIAPINE FUMARATE 25 MG PO TABS
12.5000 mg | ORAL_TABLET | Freq: Every evening | ORAL | Status: DC | PRN
  Administered 2020-05-27: 12.5 mg via ORAL
  Filled 2020-05-25: qty 1

## 2020-05-25 NOTE — Progress Notes (Signed)
Referring Physician(s): Chiu,S  Supervising Physician: Daryll Brod  Patient Status:  [facility] - In-pt  Chief Complaint: Right renal subcapsular abscess/flank pain   Subjective: Patient worked extensively with PT today, currently lying in bed, appears fatigued; no new complaints.  Still has some mild tenderness at right flank drain site.  Wife in room.   Allergies: Darifenacin, Codeine, Sulfamethoxazole-trimethoprim, and Lisinopril  Medications: Prior to Admission medications   Medication Sig Start Date End Date Taking? Authorizing Provider  acetaminophen (TYLENOL) 500 MG tablet Take 500 mg by mouth at bedtime.    Yes [provider]  amLODipine (NORVASC) 2.5 MG tablet Take 1 tablet (2.5 mg total) by mouth daily. 09/03/19  Yes Tobb, Kardie, DO  Ascorbic Acid (VITAMIN C) 1000 MG tablet Take 1,000 mg by mouth daily.   Yes [provider]  Biotin 2500 MCG CAPS Take 2,500 mcg by mouth daily.   Yes [provider]  cholecalciferol (VITAMIN D) 1000 UNITS tablet Take 1,000 Units by mouth daily.   Yes [provider]  docusate sodium (COLACE) 50 MG capsule Take 50 mg by mouth daily.   Yes [provider]  furosemide (LASIX) 20 MG tablet Take 20 mg by mouth daily.   Yes [provider]  Glucosamine HCl (GLUCOSAMINE PO) Take 2,000 mg by mouth daily.   Yes [provider]  isosorbide mononitrate (IMDUR) 30 MG 24 hr tablet Take 0.5 tablets (15 mg total) by mouth daily. 02/05/20  Yes Richardo Priest, MD  Multiple Vitamin (MULTIVITAMIN) capsule Take 1 capsule by mouth daily.  04/28/19  Yes [provider]  nitroGLYCERIN (NITROSTAT) 0.4 MG SL tablet Place 1 tablet (0.4 mg total) under the tongue every 5 (five) minutes as needed. 07/14/19  Yes Richardo Priest, MD  Omega-3 Fatty Acids (FISH OIL) 1000 MG CAPS Take 1,000 mg by mouth daily.   Yes [provider]  omeprazole (PRILOSEC) 20 MG capsule Take 1 capsule (20 mg  total) by mouth daily. 04/21/20  Yes Richardo Priest, MD  pravastatin (PRAVACHOL) 20 MG tablet TAKE ONE TABLET BY MOUTH ONCE DAILY Patient taking differently: Take 20 mg by mouth daily. 01/08/20  Yes Richardo Priest, MD  vitamin B-12 (CYANOCOBALAMIN) 500 MCG tablet Take 500 mcg by mouth daily.   Yes [provider]  warfarin (COUMADIN) 2.5 MG tablet Take 1 tablet daily or as directed by [facility] Patient taking differently: Take 2.5 mg by mouth daily. 04/16/20  Yes Richardo Priest, MD  cefpodoxime (VANTIN) 200 MG tablet Take 1 tablet (200 mg total) by mouth 2 (two) times daily. Patient not taking: No sig reported 04/23/20   Molpus, Jenny Reichmann, MD     Vital Signs: BP 130/61 (BP Location: Right Arm)   Pulse 66   Temp 99.4 F (37.4 C)   Resp 20   Ht 6' (1.829 m)   Wt 214 lb (97.1 kg)   SpO2 95%   BMI 29.02 kg/m   Physical Exam patient drowsy but arousable.  Right flank drain intact, insertion site okay, mildly tender to palpation, output 50 cc turbid beige fluid.  Drain irrigated without difficulty.  Imaging: CT ABDOMEN PELVIS W CONTRAST  Result Date: 05/25/2020 CLINICAL DATA:  Enlarging right subcapsular perinephric fluid collection, status post drain catheter placement 05/18/2020 EXAM: CT ABDOMEN AND PELVIS WITH CONTRAST TECHNIQUE: Multidetector CT imaging of the abdomen and pelvis was performed using the standard protocol following bolus administration of intravenous contrast. CONTRAST:  165mL OMNIPAQUE IOHEXOL 300  MG/ML  SOLN COMPARISON:  05/15/2020 and previous FINDINGS: Lower chest: Trace pericardial effusions as before. Transvenous pacing leads partially visualized. Status post AVR. Trace pericardial effusion. Patchy subsegmental atelectasis/consolidation posteriorly in the lung bases. Hepatobiliary: Liver unremarkable. Several subcentimeter partially calcified stones in the dependent aspect of the nondilated gallbladder. Pancreas: Unremarkable. No pancreatic ductal dilatation or  surrounding inflammatory changes. Spleen: Normal in size without focal abnormality. Adrenals/Urinary Tract: Adrenal glands unremarkable. 1.4 cm mid left renal cyst. Urinary bladder nondistended. On the right, interval placement of percutaneous drain catheter with partial decompression of the perinephric collection, residual component 4.5 x 1.8 cm. Right nephrolithiasis, largest stone 1.9 cm centrally in the collecting system, nonobstructive. High attenuation material in the distended renal collecting system. Ureters decompressed. Enlarging 2 cm and 1.6 cm collections adjacent to the lower pole may represent developing small perinephric abscesses. Regional inflammatory changes persist. Stomach/Bowel: Stomach and small bowel are nondilated. The colon is decompressed, unremarkable. Vascular/Lymphatic: Heavy extensive aortic and iliofemoral calcified atheromatous plaque without aneurysm or evident high-grade stenosis. No abdominal or pelvic adenopathy. Infrarenal IVC filter without suggestion of caval thrombosis. Reproductive: Previous prostatectomy. Other: No ascites.  No free air. Musculoskeletal: Multilevel lumbar spondylitic change. Negative for fracture or worrisome bone lesion. For IMPRESSION: 1. Partial decompression of right perinephric collection post percutaneous drain catheter placement. 2. Enlarging 2 cm and 1.6 cm lower pole collections may represent developing small perinephric abscesses. 3. Right nephrolithiasis with possible pyonephrosis 4. Cholelithiasis. Aortic Atherosclerosis (ICD10-I70.0). Electronically Signed   By: Lucrezia Europe M.D.   On: 05/25/2020 08:23    Labs:  CBC: Recent Labs    05/22/20 0512 05/23/20 0711 05/24/20 0829 05/25/20 0512  WBC 10.7* 10.2 10.1 10.8*  HGB 11.1* 10.8* 11.3* 11.0*  HCT 35.2* 34.0* 35.2* 34.5*  PLT 343 351 319 355    COAGS: Recent Labs    04/22/20 2253 04/27/20 1102 05/22/20 0512 05/23/20 0711 05/24/20 0829 05/25/20 0512  INR 2.7*   < > 1.6*  1.9* 2.4* 2.9*  APTT 43*  --   --   --   --   --    < > = values in this interval not displayed.    BMP: Recent Labs    07/14/19 1343 08/24/19 2011 09/08/19 0920 04/22/20 2253 05/22/20 0512 05/23/20 0711 05/24/20 0829 05/25/20 0512  NA 143 139 141   < > 133* 134* 137 137  K 4.4 3.9 4.1   < > 4.2 4.6 4.3 4.2  CL 109* 108 106   < > 102 102 102 104  CO2 18* 23 22   < > 23 24 27 26   GLUCOSE 131* 110* 107*   < > 117* 122* 182* 113*  BUN 30* 19 22   < > 23 21 20 15   CALCIUM 10.4* 9.6 10.0   < > 9.2 9.5 9.7 9.4  CREATININE 1.66* 1.43* 1.42*   < > 1.07 1.11 1.17 0.99  GFRNONAA 37* 44* 44*   < > >60 >60 >60 >60  GFRAA 43* 51* 51*  --   --   --   --   --    < > = values in this interval not displayed.    LIVER FUNCTION TESTS: Recent Labs    04/27/20 1102 05/17/20 0659 05/18/20 0449 05/19/20 0515  BILITOT 1.3* 0.9 0.8 1.1  AST 54* 16 20 16   ALT 19 9 9 8   ALKPHOS 103 71 69 68  PROT 7.5 5.8* 5.8* 6.0*  ALBUMIN 3.1* 2.3* 2.2* 2.4*  Assessment and Plan: Patient with history of recent I&D of perirectal abscess, right nephrolithiasis/pyelonephritis with enlarging right renal subcapsular fluid collection, status post drain placement on 4/12; afebrile, WBC 10.8, hemoglobin stable, creatinine normal, PT 30.4, INR 2.9; latest CT of abdomen pelvis was reviewed by Dr. Annamaria Boots and reveals partial decompression of the right perinephric abscess, enlarging 2 cm and 1.6 cm lower pole collections, right nephrolithiasis with possible pyelonephrosis and cholelithiasis; no plans for further intervention at this time.  Continue with drain irrigation, output monitoring; follow-up scan in 1 to 2 weeks; if patient discharged home with drain recommend once daily irrigation with 5 cc sterile normal saline, output recording and gauze dressing changes every 1 to 2 days; he can be scheduled to undergo follow-up imaging at IR drain clinic once discharge plans finalized.   Electronically Signed: D. Rowe Robert, PA-C 05/25/2020, 4:49 PM   I spent a total of 15 minutes at the the patient's bedside AND on the patient's hospital floor or unit, greater than 50% of which was counseling/coordinating care for right renal subcapsular abscess drain    Patient ID: [name], male   DOB: 1931-06-05, 87 y.o.   MRN: [id number]

## 2020-05-25 NOTE — Progress Notes (Signed)
Physical Therapy Treatment Patient Details Name: [name] MRN: [id number] DOB: 03/20/1931 Today's Date: 05/25/2020    History of Present Illness 87 y.o. male admitted on 05/15/20 who presents with concerns of rectal pain, found to have perirectal abscess, s/p I &D 05/16/20 and s/p 80F drain R renal subcapsular fluid collection 05/18/20.Marland Kitchen Also found to have 6g drop in hgb with heme pos stools, suspect GIB. 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    PT Comments    Pt assisted with ambulating in hallway.  Pt used rollator today and able to take seated rest break.  Pt and spouse hopeful for d/c home soon however only when pt is medically ready.    Follow Up Recommendations  Home health PT;Supervision/Assistance - 24 hour     Equipment Recommendations  None recommended by PT    Recommendations for Other Services       Precautions / Restrictions Precautions Precautions: Fall;Other (comment) Precaution Comments: urinary incontinence, R JP drain    Mobility  Bed Mobility Overal bed mobility: Needs Assistance Bed Mobility: Supine to Sit;Sit to Supine     Supine to sit: Mod assist Sit to supine: Min assist   General bed mobility comments: assisted with trunk upright and scooting to EOB, assist for LEs onto bed    Transfers Overall transfer level: Needs assistance Equipment used: Rolling walker (2 wheeled) Transfers: Sit to/from Stand Sit to Stand: Min guard;Min assist         General transfer comment: assist to rise and steady, cues for hand placement, pt able to control descent  Ambulation/Gait Ambulation/Gait assistance: Min guard;Min assist Gait Distance (Feet): 50 Feet (x2) Assistive device: Rolling walker (2  wheeled) Gait Pattern/deviations: Decreased stride length;Step-through pattern;Trunk flexed     General Gait Details: assist for stability and maneuvering rollator for turns; took one seated rest break with rollator; cues for distance from rollator and posture   Stairs             Wheelchair Mobility    Modified Rankin (Stroke Patients Only)       Balance                                            Cognition Arousal/Alertness: Awake/alert Behavior During Therapy: WFL for tasks assessed/performed Overall Cognitive Status: Impaired/Different from baseline                       Memory: Decreased short-term memory Following Commands: Follows one step commands with increased time Safety/Judgement: Decreased awareness of safety;Decreased awareness of deficits   Problem Solving: Slow processing        Exercises      General Comments        Pertinent Vitals/Pain Pain Assessment: No/denies pain    Home Living                      Prior Function            PT Goals (current goals can now be found in the care plan section) Progress towards PT goals: Progressing toward goals    Frequency    Min 3X/week  PT Plan Current plan remains appropriate    Co-evaluation              AM-PAC PT "6 Clicks" Mobility   Outcome Measure  Help needed turning from your back to your side while in a flat bed without using bedrails?: A Little Help needed moving from lying on your back to sitting on the side of a flat bed without using bedrails?: A Lot Help needed moving to and from a bed to a chair (including a wheelchair)?: A Lot Help needed standing up from a chair using your arms (e.g., wheelchair or bedside chair)?: A Lot Help needed to walk in hospital room?: A Lot Help needed climbing 3-5 steps with a railing? : Total 6 Click Score: 12    End of Session Equipment Utilized During Treatment: Gait belt Activity  Tolerance: Patient limited by fatigue Patient left: in bed;with call bell/phone within reach;with bed alarm set;with family/visitor present   PT Visit Diagnosis: Muscle weakness (generalized) (M62.81);Difficulty in walking, not elsewhere classified (R26.2)     Time: 3086-5784 PT Time Calculation (min) (ACUTE ONLY): 32 min  Charges:  $Gait Training: 23-37 mins                     Jannette Spanner PT, DPT Acute Rehabilitation Services Pager: [phone] Office: [phone]  York Ram E 05/25/2020, 3:26 PM

## 2020-05-25 NOTE — Progress Notes (Signed)
ANTICOAGULATION CONSULT NOTE - Follow up [place] for Warfarin Indication: history of DVT/PE, atrial fibrillation   Allergies  Allergen Reactions  . Darifenacin Nausea Only and Other (See Comments)    dry mouth and dizziness ENABLEX Dizziness Pt denies  . Codeine Other (See Comments)  . Sulfamethoxazole-Trimethoprim Other (See Comments)  . Lisinopril Cough    Pt denies    Patient Measurements: Height: 6' (182.9 cm) Weight: 97.1 kg (214 lb) IBW/kg (Calculated) : 77.6  Labs: Recent Labs    05/23/20 0711 05/24/20 0829 05/25/20 0512  HGB 10.8* 11.3* 11.0*  HCT 34.0* 35.2* 34.5*  PLT 351 319 355  LABPROT 22.0* 25.9* 30.4*  INR 1.9* 2.4* 2.9*  CREATININE 1.11 1.17 0.99    Estimated Creatinine Clearance: 63.5 mL/min (by C-G formula based on SCr of 0.99 mg/dL).  Medications:  Home warfarin regimen: 2.5mg  PO daily-last dose reported 05/14/2020 at 2100.  INR supratherapeutic at 3.3.   Assessment: 61 y/oM with PMH of prostate CA, aortic stenosis s/p TAVR, atrial fibrillation, history of recurrent  (remote) DVT/PE s/p IVC filter on warfarin who presented with rectal bleeding, supratherapeutic INR, and anemia.  Significant events:  4/9 Vitamin K 2.5mg  PO x 1  4/10: INR 3.4 in AM; Urgent I&D of perirectal abscess, given KCentra and Vitamin K 10mg  IV x 1 pre-procedure. Postprocedure INR 1.4 and Warfarin resumed in PM  4/11: warfarin held for drainage of R subcapsular renal fluid collection  4/14: resuming warfarin  Today, 05/25/2020:  INR 2.9 continues to be therapeutic but rising and now at upper end of goal likely due to higher doses of 4mg  x 2 given earlier in week  Hgb low/stable at 11, Plt stable WNL  No further rectal bleeding noted or procedures planned  Tolerating diet  No major DDIs with warfarin noted  Goal of Therapy:  INR 2-3 Monitor platelets by anticoagulation protocol: Yes   Plan:   NO warfarin today  Daily PT/INR,  CBC  Monitor for s/sx of bleeding   Adrian Saran, PharmD, BCPS 05/25/2020 4:54 PM

## 2020-05-25 NOTE — Progress Notes (Deleted)
ANTICOAGULATION CONSULT NOTE - Follow up [facility] for Warfarin Indication: history of DVT/PE, atrial fibrillation   Allergies  Allergen Reactions  . Darifenacin Nausea Only and Other (See Comments)    dry mouth and dizziness ENABLEX Dizziness Pt denies  . Codeine Other (See Comments)  . Sulfamethoxazole-Trimethoprim Other (See Comments)  . Lisinopril Cough    Pt denies    Patient Measurements: Height: 6' (182.9 cm) Weight: 97.1 kg (214 lb) IBW/kg (Calculated) : 77.6  Labs: Recent Labs    05/23/20 0711 05/24/20 0829 05/25/20 0512  HGB 10.8* 11.3* 11.0*  HCT 34.0* 35.2* 34.5*  PLT 351 319 355  LABPROT 22.0* 25.9* 30.4*  INR 1.9* 2.4* 2.9*  CREATININE 1.11 1.17 0.99    Estimated Creatinine Clearance: 63.5 mL/min (by C-G formula based on SCr of 0.99 mg/dL).  Medications:  Home warfarin regimen: 2.5mg  PO daily-last dose reported 05/14/2020 at 2100. INR supratherapeutic at 3.3.   Assessment: 82 y/oM with PMH of prostate CA, aortic stenosis s/p TAVR, atrial fibrillation, history of recurrent (remote) DVT/PE s/p IVC filter on warfarin who presented with rectal bleeding, supratherapeutic INR, and anemia.  Significant events:  4/9 Vitamin K 2.5mg  PO x 1  4/10: INR 3.4 in AM; Urgent I&D of perirectal abscess, given KCentra and Vitamin K 10mg  IV x 1 pre-procedure. Postprocedure INR 1.4 and Warfarin resumed in PM  4/11: warfarin held for drainage of R subcapsular renal fluid collection  4/14: resuming warfarin  Today, 05/25/2020:  INR 2.9, still therapeutic  Hgb low/stable at 11.0, Plt stable WNL  No further rectal bleeding noted or procedures planned  Meal intake not reported.  No major DDIs with warfarin noted  Goal of Therapy:  INR 2-3 Monitor platelets by anticoagulation protocol: Yes   Plan:   Warfarin 2.5 mg PO x 1 today  Daily PT/INR, CBC  Monitor for s/sx of bleeding   Eloise Levels, Student PharmD 05/25/2020 8:23 AM

## 2020-05-25 NOTE — Progress Notes (Signed)
[facility]  Date of Admission:  05/15/2020           Reason for visit: Follow up on perirectal and renal abscess  Current antibiotics: Meropenem (4/13--present)  Previous antibiotics: Piperacillin tazobactam (4/9--4/13)  ASSESSMENT:    1. Perirectal abscess secondary to ESBL E. coli status post I&D 05/16/2020 2. Pyelonephritis, right renal subscapular abscess secondary to ESBL E. coli status post IR drain placement 05/18/20: Interval CT scan done 05/24/2020 shows improvement in index fluid collection decreased in size from 7.1 x 4.7 cm down to 4.5 x 1.8 cm.  Follow-up CT scan, however, also noted enlarging 2 cm and 1.6 cm lower pole collections may represent developing small perinephric abscesses 3. Nonobstructing nephrolithiasis: Urology evaluated 05/24/2020 with no further inpatient recommendations and advised outpatient follow-up. 4. CKD: Improving with creatinine 0.9 this morning  PLAN:    . Continue meropenem. Marland Kitchen Await further IR recommendations regarding drain management and whether the small 2 new collections are amenable to drainage. . Contact precautions. . Will follow.   Principal Problem:   Perirectal abscess Active Problems:   Hypertensive heart disease   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)   Gastro-esophageal reflux disease without esophagitis   Long term (current) use of anticoagulants   Persistent atrial fibrillation (HCC)   CKD (chronic kidney disease), stage III (HCC)   S/P TAVR (transcatheter aortic valve replacement)   OSA on CPAP   CAD (coronary artery disease), native coronary artery   Atrial fibrillation, persistent (HCC)   Pyelonephritis of right kidney   Presence of IVC filter   Constipation, chronic   Cysts of right kidney   Renal abscess    MEDICATIONS:    Scheduled Meds: . furosemide  20 mg Oral Daily  . isosorbide mononitrate  15 mg Oral Daily  . pantoprazole  40 mg Oral  Q0600  . polyethylene glycol  34 g Oral BID  . pravastatin  20 mg Oral Daily  . sodium chloride flush  10-40 mL Intracatheter Q12H  . sodium chloride flush  5 mL Intracatheter Q8H  . vitamin B-12  500 mcg Oral Daily  . Warfarin - Pharmacist Dosing Inpatient   Does not apply q1600   Continuous Infusions: . sodium chloride 10 mL/hr at 05/22/20 1840  . meropenem (MERREM) IV 1 g (05/25/20 1038)   PRN Meds:.acetaminophen, haloperidol lactate, HYDROcodone-acetaminophen, QUEtiapine, sodium chloride flush  SUBJECTIVE:   24 hour events:  No acute events noted overnight Afebrile, T-max 99.4 Drain output measured as 50 cc over last 24 hours Status post repeat CT scan with improved size of perinephric collection status post percutaneous drain catheter placement now measuring 4.5 x 1.8 cm.  There was an enlarging 2 cm and 1.6 cm collection adjacent to the lower pole which may represent developing small perinephric abscesses. WBC stable Creatinine 0.9  Patient seen and examined this afternoon alongside his wife, daughter, and son-in-law.  He reports no acute complaints.  Continues to have some right-sided discomfort.  He wants to go home as soon as possible.  His wife reports that he had a calm night last night after receiving a sedative.     OBJECTIVE:   Blood pressure 130/61, pulse 66, temperature 99.4 F (37.4 C), resp. rate 20, height 6' (1.829 m), weight 97.1 kg, SpO2 95 %. Body mass index is 29.02 kg/m.  Physical Exam Constitutional:      General: He is not in acute distress.  Appearance: Normal appearance.  HENT:     Head: Normocephalic and atraumatic.  Pulmonary:     Effort: Pulmonary effort is normal. No respiratory distress.  Genitourinary:    Comments: Right-sided IR drain in place Neurological:     General: No focal deficit present.     Mental Status: He is alert. Mental status is at baseline.     Comments: Appears drowsy and occasionally drifting off to sleep   Psychiatric:        Mood and Affect: Mood normal.        Behavior: Behavior normal.      Lab Results: Lab Results  Component Value Date   WBC 10.8 (H) 05/25/2020   HGB 11.0 (L) 05/25/2020   HCT 34.5 (L) 05/25/2020   MCV 80.4 05/25/2020   PLT 355 05/25/2020    Lab Results  Component Value Date   NA 137 05/25/2020   K 4.2 05/25/2020   CO2 26 05/25/2020   GLUCOSE 113 (H) 05/25/2020   BUN 15 05/25/2020   CREATININE 0.99 05/25/2020   CALCIUM 9.4 05/25/2020   GFRNONAA >60 05/25/2020   GFRAA 51 (L) 09/08/2019    Lab Results  Component Value Date   ALT 8 05/19/2020   AST 16 05/19/2020   ALKPHOS 68 05/19/2020   BILITOT 1.1 05/19/2020       Component Value Date/Time   CRP <0.5 04/22/2013 1106       Component Value Date/Time   ESRSEDRATE 4 04/22/2013 1106     I have reviewed the micro and lab results in Epic.  Imaging: CT ABDOMEN PELVIS W CONTRAST  Result Date: 05/25/2020 CLINICAL DATA:  Enlarging right subcapsular perinephric fluid collection, status post drain catheter placement 05/18/2020 EXAM: CT ABDOMEN AND PELVIS WITH CONTRAST TECHNIQUE: Multidetector CT imaging of the abdomen and pelvis was performed using the standard protocol following bolus administration of intravenous contrast. CONTRAST:  130mL OMNIPAQUE IOHEXOL 300 MG/ML  SOLN COMPARISON:  05/15/2020 and previous FINDINGS: Lower chest: Trace pericardial effusions as before. Transvenous pacing leads partially visualized. Status post AVR. Trace pericardial effusion. Patchy subsegmental atelectasis/consolidation posteriorly in the lung bases. Hepatobiliary: Liver unremarkable. Several subcentimeter partially calcified stones in the dependent aspect of the nondilated gallbladder. Pancreas: Unremarkable. No pancreatic ductal dilatation or surrounding inflammatory changes. Spleen: Normal in size without focal abnormality. Adrenals/Urinary Tract: Adrenal glands unremarkable. 1.4 cm mid left renal cyst. Urinary bladder  nondistended. On the right, interval placement of percutaneous drain catheter with partial decompression of the perinephric collection, residual component 4.5 x 1.8 cm. Right nephrolithiasis, largest stone 1.9 cm centrally in the collecting system, nonobstructive. High attenuation material in the distended renal collecting system. Ureters decompressed. Enlarging 2 cm and 1.6 cm collections adjacent to the lower pole may represent developing small perinephric abscesses. Regional inflammatory changes persist. Stomach/Bowel: Stomach and small bowel are nondilated. The colon is decompressed, unremarkable. Vascular/Lymphatic: Heavy extensive aortic and iliofemoral calcified atheromatous plaque without aneurysm or evident high-grade stenosis. No abdominal or pelvic adenopathy. Infrarenal IVC filter without suggestion of caval thrombosis. Reproductive: Previous prostatectomy. Other: No ascites.  No free air. Musculoskeletal: Multilevel lumbar spondylitic change. Negative for fracture or worrisome bone lesion. For IMPRESSION: 1. Partial decompression of right perinephric collection post percutaneous drain catheter placement. 2. Enlarging 2 cm and 1.6 cm lower pole collections may represent developing small perinephric abscesses. 3. Right nephrolithiasis with possible pyonephrosis 4. Cholelithiasis. Aortic Atherosclerosis (ICD10-I70.0). Electronically Signed   By: Lucrezia Europe M.D.   On: 05/25/2020 08:23  Imaging independently reviewed in Epic.    [facility] [place] [phone] pager 05/25/2020, 1:32 PM

## 2020-05-25 NOTE — Progress Notes (Signed)
PROGRESS NOTE    [name]  OVF:[id number] DOB: [birth year] DOA: 05/15/2020 PCP: Shirline Frees, MD    Chief Complaint  Patient presents with  . 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 Also found to have 6g drop in hgb with heme pos stools on presentation. Patient also noted to have a renal abscess status post drain placement with cultures pending.   Assessment & Plan:   Principal Problem:   Perirectal abscess Active Problems:   Hypertensive heart disease   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)   Gastro-esophageal reflux disease without esophagitis   Long term (current) use of anticoagulants   Persistent atrial fibrillation (HCC)   CKD (chronic kidney disease), stage III (HCC)   S/P TAVR (transcatheter aortic valve replacement)   OSA on CPAP   CAD (coronary artery disease), native coronary artery   Atrial fibrillation, persistent (HCC)   Pyelonephritis of right kidney   Presence of IVC filter   Constipation, chronic   Cysts of right kidney   Renal abscess  1 perirectal abscess secondary to ESBL E. coli status post I&D 4/10 -Noted on CT abdomen and pelvis. -General surgery consulted and patient underwent incision and drainage(4/10) per general surgery. -Cultures from incision and drainage with abundant ESBL E. coli sensitive to Zosyn, Cipro, gentamicin, imipenem, Bactrim and resistant to Unasyn, ampicillin, cefazolin, ceftazidime, Rocephin with intermediate sensitivity to cefepime. -Due to concurrent renal abscess, ID consulted and IV antibiotics changed to IV Merrem.   -Continue sitz bath's as  recommended per general surgery.   -General surgery was following but have signed off.   -Outpatient follow-up with general surgery.   2.  Right renal subcapsular fluid collection/renal abscess/ Persistent pyelonephritis/nonobstructing nephrolithiasis. -Patient noted to have a right renal subcapsular fluid collection/renal abscess, -Patient noted to have had a UTI approximately a month ago placed on Bactrim later reevaluated in the ED placed on cefpodoxime.  Urine cultures with no growth. -Cultures from this admission negative. -Unable to perform MRI secondary to pacemaker. -Dr Wyline Copas discussed with urology who recommended drainage per IR. -Status post drain placement per IR with purulent fluid noted and cultures positive for ESBL E. coli.   -Per IR once output from drainage < 10cc, will need repeat CT scan. -Repeat CT abdomen and pelvis ordered and done (05/24/2020) with partial decompression of right perinephric collection post percutaneous drain catheter placement, enlarging 2 cm 1.6 cm lower pole collections may represent developing small perinephric abscesses, right nephrolithiasis with possible pyelonephrosis, cholelithiasis.  -There was concern on 05/22/2020, about drain insertion site concerning for infection however assessed insertion site of drain myself which is nonerythematous, no warmth, no significant tenderness to palpation. -IR recommended urology consultation/input during this hospitalization and as such urology saw patient in consultation and recommended continue drain per IR, antibiotic recommendations per ID, no other intervention necessary at this time, outpatient follow-up with primary urologist, Dr. Burnell Blanks post discharge. -Continue IV Merrem for ESBL per ID recommendations. -Will await ID and IR recommendations pending the evaluation of repeat CT. -ID following and appreciate input and recommendations.  3.  Anemia/melena -Concern for acute GI bleed in the setting of Coumadin  use. -[facility] for history of hemorrhoids in the past. -Hemoglobin noted to have gone down to 9.7 from 16.2 (04/27/2020). -Hemoglobin stable at 11.0. -Patient with no overt bleeding. -Stools noted to be heme positive. -Dr. Wyline Copas discussed with GI on-call, and given recent surgical drainage of perirectal abscess recommendation is to hold off on inpatient endoscopy for now and follow-up in the outpatient setting when more stable. -Coumadin has been resumed.  Monitor closely.  4.  History of persistent atrial fibrillation - CHA2DSVASC score 5 -Coumadin was held initially during the hospitalization, due to concerns for anemia/melena.   -Coumadin resumed.   -INR at 2.9.   -Hemoglobin stable at 11.0.   -Coumadin per pharmacy.  5.  History of recurrent DVT/PE with supratherapeutic INR -Patient noted to have a history of provoked DVT following prostatectomy status post IVC placement afterwards. -Patient noted to have developed bacteremia and PE and DVT and subsequently placed on warfarin. -Patient's PE and DVT noted to be remote. -Coumadin held initially due to concern for dark stools overnight.   -Coumadin resumed.   -INR at 2.9.  -Hemoglobin stable at 11.0 -Coumadin per pharmacy.  6.  History of heart block status post PPM -Outpatient follow-up with cardiology.    7.  History of aortic stenosis status post TAVR -Appears to be a bioprosthetic valve. -Outpatient follow-up.  8.  Coronary artery disease status post stent -Lasix and Norvasc held due to borderline blood pressure.   -Continue Imdur, Pravachol.   -Resume Lasix.    9.  OSA Patient noted to be refusing CPAP at bedtime and has been changed to as needed.  10.  Chronic kidney disease stage IIIa -Stable.  11.  Borderline blood pressure -Patient noted to have borderline blood pressure 05/21/2020.   -Continue Imdur.  Resume home regimen Lasix. -Continue to hold Norvasc.    12.   Constipation -Resolved on current bowel regimen.   -MiraLAX twice daily.     DVT prophylaxis: SCDs Code Status: Full Family Communication: Updated patient and daughter and son-in-law at bedside.  Disposition:   Status is: Inpatient    Dispo: The patient is from: Home              Anticipated d/c is to: Home with home health.               Patient currently with a perirectal abscess status post incision and drainage with cultures of ESBL, renal abscess status post drain placement per IR cultures positive for ESBL, on IV antibiotics, ID following.  Repeat CT abdomen and pelvis with concern for lower pole collections concerning for developing small perinephric abscesses.  Not stable for discharge.   Difficult to place patient no       Consultants:   General surgery: Dr. Donne Hazel 05/16/2020  Interventional radiology: Dr. Laurence Ferrari 05/17/2020  Infectious disease: Dr. Juleen China 05/19/2020  Urology: Dr. Lovena Neighbours 05/24/2020  Palliative care: Dr. Rowe Pavy 05/24/2020  Procedures:   CT abdomen and pelvis 05/15/2020  Placement of 12 French percutaneous drainage catheter with aspiration of 135 mL of frankly pleural fluid from subscapular perinephric fluid collection-Per IR, Dr. Laurence Ferrari 05/18/2020  Incision and drainage of perirectal abscess per general surgery, Dr. Donne Hazel 05/16/2020  Antimicrobials:   IV Merrem 05/19/2020>>>>  IV Zosyn 05/15/2020 >>>> 05/19/2020    Subjective: Sleeping but arousable.  Daughter at bedside stated patient recently received a sedative.   -Patient denies any chest pain, no shortness of breath, no abdominal pain.    Objective: Vitals:  05/23/20 1349 05/24/20 1346 05/24/20 2027 05/25/20 0511  BP: (!) 117/55 128/64 (!) 148/59 (!) 166/68  Pulse: 64 62 62 61  Resp: 20 16 20 20   Temp:  98.6 F (37 C) 98.3 F (36.8 C) 97.7 F (36.5 C)  TempSrc: Oral     SpO2: 98% 98% 96% 98%  Weight:      Height:        Intake/Output Summary (Last 24 hours) at  05/25/2020 1234 Last data filed at 05/25/2020 0533 Gross per 24 hour  Intake 10 ml  Output 725 ml  Net -715 ml   Filed Weights   05/15/20 1923  Weight: 97.1 kg    Examination:  General exam: : NAD Respiratory system: CTA B anterior lung fields.  No wheezes, no rhonchi.  Speaking in full sentences.  Normal respiratory effort. Cardiovascular system: Regular rate and rhythm no murmurs rubs or gallops.  No JVD.  No lower extremity edema.  Gastrointestinal system: Abdomen soft, nontender, nondistended, positive bowel sounds.  No rebound.  No guarding.  JP drain with purulent drainage. Central nervous system: Alert and oriented. No focal neurological deficits. Extremities: Symmetric 5 x 5 power. Skin: No rashes, lesions or ulcers Psychiatry: Judgement and insight appear normal. Mood & affect appropriate.  Data Reviewed: I have personally reviewed following labs and imaging studies  CBC: Recent Labs  Lab 05/21/20 0517 05/22/20 0512 05/23/20 0711 05/24/20 0829 05/25/20 0512  WBC 10.0 10.7* 10.2 10.1 10.8*  NEUTROABS  --   --   --  7.9* 8.1*  HGB 10.8* 11.1* 10.8* 11.3* 11.0*  HCT 34.4* 35.2* 34.0* 35.2* 34.5*  MCV 80.8 80.2 80.2 80.5 80.4  PLT 315 343 351 319 097    Basic Metabolic Panel: Recent Labs  Lab 05/20/20 0517 05/21/20 0517 05/22/20 0512 05/23/20 0711 05/24/20 0829 05/25/20 0512  NA 136 137 133* 134* 137 137  K 3.9 4.2 4.2 4.6 4.3 4.2  CL 102 106 102 102 102 104  CO2 27 25 23 24 27 26   GLUCOSE 130* 119* 117* 122* 182* 113*  BUN 20 22 23 21 20 15   CREATININE 1.38* 1.38* 1.07 1.11 1.17 0.99  CALCIUM 9.4 9.2 9.2 9.5 9.7 9.4  MG 1.9 2.0  --   --   --   --     GFR: Estimated Creatinine Clearance: 63.5 mL/min (by C-G formula based on SCr of 0.99 mg/dL).  Liver Function Tests: Recent Labs  Lab 05/19/20 0515  AST 16  ALT 8  ALKPHOS 68  BILITOT 1.1  PROT 6.0*  ALBUMIN 2.4*    CBG: No results for input(s): GLUCAP in the last 168 hours.   Recent  Results (from the past 240 hour(s))  Resp Panel by RT-PCR (Flu A&B, Covid) Nasopharyngeal Swab     Status: None   Collection Time: 05/16/20  8:06 AM   Specimen: Nasopharyngeal Swab; Nasopharyngeal(NP) swabs in vial transport medium  Result Value Ref Range Status   SARS Coronavirus 2 by RT PCR NEGATIVE NEGATIVE Final    Comment: (NOTE) SARS-CoV-2 target nucleic acids are NOT DETECTED.  The SARS-CoV-2 RNA is generally detectable in upper respiratory specimens during the acute phase of infection. The lowest concentration of SARS-CoV-2 viral copies this assay can detect is 138 copies/mL. A negative result does not preclude SARS-Cov-2 infection and should not be used as the sole basis for treatment or other patient management decisions. A negative result may occur with  improper specimen collection/handling, submission of specimen other than nasopharyngeal  swab, presence of viral mutation(s) within the areas targeted by this assay, and inadequate number of viral copies(<138 copies/mL). A negative result must be combined with clinical observations, patient history, and epidemiological information. The expected result is Negative.  Fact Sheet for Patients:  EntrepreneurPulse.com.au  Fact Sheet for Healthcare Providers:  IncredibleEmployment.be  This test is no t yet approved or cleared by the [place] FDA and  has been authorized for detection and/or diagnosis of SARS-CoV-2 by FDA under an Emergency Use Authorization (EUA). This EUA will remain  in effect (meaning this test can be used) for the duration of the COVID-19 declaration under Section 564(b)(1) of the Act, 21 U.S.C.section 360bbb-3(b)(1), unless the authorization is terminated  or revoked sooner.       Influenza A by PCR NEGATIVE NEGATIVE Final   Influenza B by PCR NEGATIVE NEGATIVE Final    Comment: (NOTE) The Xpert Xpress SARS-CoV-2/FLU/RSV plus assay is intended as an aid in the  diagnosis of influenza from Nasopharyngeal swab specimens and should not be used as a sole basis for treatment. Nasal washings and aspirates are unacceptable for Xpert Xpress SARS-CoV-2/FLU/RSV testing.  Fact Sheet for Patients: EntrepreneurPulse.com.au  Fact Sheet for Healthcare Providers: IncredibleEmployment.be  This test is not yet approved or cleared by the [place] FDA and has been authorized for detection and/or diagnosis of SARS-CoV-2 by FDA under an Emergency Use Authorization (EUA). This EUA will remain in effect (meaning this test can be used) for the duration of the COVID-19 declaration under Section 564(b)(1) of the Act, 21 U.S.C. section 360bbb-3(b)(1), unless the authorization is terminated or revoked.  Performed at [facility], [place] [address], [place], [place] [address]   Aerobic/Anaerobic Culture w Gram Stain (surgical/deep wound)     Status: None   Collection Time: 05/16/20 11:22 AM   Specimen: PATH Other; Tissue  Result Value Ref Range Status   Specimen Description   Final    TISSUE Performed at [facility] [address] [place], [place] [address]    Special Requests   Final    NONE Performed at [facility], [place] [address], [place], [place] [address]    Gram Stain   Final    MODERATE WBC PRESENT, PREDOMINANTLY PMN FEW GRAM POSITIVE COCCI IN PAIRS MODERATE GRAM NEGATIVE RODS    Culture   Final    ABUNDANT ESCHERICHIA COLI Confirmed Extended Spectrum Beta-Lactamase Producer (ESBL).  In bloodstream infections from ESBL organisms, carbapenems are preferred over piperacillin/tazobactam. They are shown to have a lower risk of mortality. NO ANAEROBES ISOLATED Performed at [facility], [place] [address] [place], [place] [address]    Report Status 05/21/2020 FINAL  Final   Organism ID, Bacteria ESCHERICHIA COLI  Final      Susceptibility   Escherichia  coli - MIC*    AMPICILLIN >=32 RESISTANT Resistant     CEFAZOLIN >=64 RESISTANT Resistant     CEFEPIME 8 INTERMEDIATE Intermediate     CEFTAZIDIME RESISTANT Resistant     CEFTRIAXONE >=64 RESISTANT Resistant     CIPROFLOXACIN <=0.25 SENSITIVE Sensitive     GENTAMICIN <=1 SENSITIVE Sensitive     IMIPENEM <=0.25 SENSITIVE Sensitive     TRIMETH/SULFA <=20 SENSITIVE Sensitive     AMPICILLIN/SULBACTAM >=32 RESISTANT Resistant     PIP/TAZO 8 SENSITIVE Sensitive     * ABUNDANT ESCHERICHIA COLI  Aerobic/Anaerobic Culture (surgical/deep wound)     Status: None   Collection Time: 05/18/20 12:01 PM  Specimen: Abscess  Result Value Ref Range Status   Specimen Description   Final    ABSCESS RT KIDNEY Performed at [facility] [address] [place], [place] [address]    Special Requests   Final    Normal Performed at [facility], [place] [address], [place], [place] [address]    Gram Stain   Final    ABUNDANT WBC PRESENT,BOTH PMN AND MONONUCLEAR FEW GRAM NEGATIVE RODS    Culture   Final    ABUNDANT ESCHERICHIA COLI Confirmed Extended Spectrum Beta-Lactamase Producer (ESBL).  In bloodstream infections from ESBL organisms, carbapenems are preferred over piperacillin/tazobactam. They are shown to have a lower risk of mortality. NO ANAEROBES ISOLATED Performed at [facility], [place] [address] [place], [place] [address]    Report Status 05/23/2020 FINAL  Final   Organism ID, Bacteria ESCHERICHIA COLI  Final      Susceptibility   Escherichia coli - MIC*    AMPICILLIN >=32 RESISTANT Resistant     CEFAZOLIN >=64 RESISTANT Resistant     CEFEPIME 8 INTERMEDIATE Intermediate     CEFTAZIDIME RESISTANT Resistant     CEFTRIAXONE >=64 RESISTANT Resistant     CIPROFLOXACIN <=0.25 SENSITIVE Sensitive     GENTAMICIN <=1 SENSITIVE Sensitive     IMIPENEM <=0.25 SENSITIVE Sensitive     TRIMETH/SULFA <=20 SENSITIVE Sensitive     AMPICILLIN/SULBACTAM >=32  RESISTANT Resistant     PIP/TAZO 8 SENSITIVE Sensitive     * ABUNDANT ESCHERICHIA COLI         Radiology Studies: CT ABDOMEN PELVIS W CONTRAST  Result Date: 05/25/2020 CLINICAL DATA:  Enlarging right subcapsular perinephric fluid collection, status post drain catheter placement 05/18/2020 EXAM: CT ABDOMEN AND PELVIS WITH CONTRAST TECHNIQUE: Multidetector CT imaging of the abdomen and pelvis was performed using the standard protocol following bolus administration of intravenous contrast. CONTRAST:  173mL OMNIPAQUE IOHEXOL 300 MG/ML  SOLN COMPARISON:  05/15/2020 and previous FINDINGS: Lower chest: Trace pericardial effusions as before. Transvenous pacing leads partially visualized. Status post AVR. Trace pericardial effusion. Patchy subsegmental atelectasis/consolidation posteriorly in the lung bases. Hepatobiliary: Liver unremarkable. Several subcentimeter partially calcified stones in the dependent aspect of the nondilated gallbladder. Pancreas: Unremarkable. No pancreatic ductal dilatation or surrounding inflammatory changes. Spleen: Normal in size without focal abnormality. Adrenals/Urinary Tract: Adrenal glands unremarkable. 1.4 cm mid left renal cyst. Urinary bladder nondistended. On the right, interval placement of percutaneous drain catheter with partial decompression of the perinephric collection, residual component 4.5 x 1.8 cm. Right nephrolithiasis, largest stone 1.9 cm centrally in the collecting system, nonobstructive. High attenuation material in the distended renal collecting system. Ureters decompressed. Enlarging 2 cm and 1.6 cm collections adjacent to the lower pole may represent developing small perinephric abscesses. Regional inflammatory changes persist. Stomach/Bowel: Stomach and small bowel are nondilated. The colon is decompressed, unremarkable. Vascular/Lymphatic: Heavy extensive aortic and iliofemoral calcified atheromatous plaque without aneurysm or evident high-grade stenosis.  No abdominal or pelvic adenopathy. Infrarenal IVC filter without suggestion of caval thrombosis. Reproductive: Previous prostatectomy. Other: No ascites.  No free air. Musculoskeletal: Multilevel lumbar spondylitic change. Negative for fracture or worrisome bone lesion. For IMPRESSION: 1. Partial decompression of right perinephric collection post percutaneous drain catheter placement. 2. Enlarging 2 cm and 1.6 cm lower pole collections may represent developing small perinephric abscesses. 3. Right nephrolithiasis with possible pyonephrosis 4. Cholelithiasis. Aortic Atherosclerosis (ICD10-I70.0). Electronically Signed   By: Lucrezia Europe M.D.   On: 05/25/2020 08:23  Scheduled Meds: . furosemide  20 mg Oral Daily  . isosorbide mononitrate  15 mg Oral Daily  . pantoprazole  40 mg Oral Q0600  . polyethylene glycol  34 g Oral BID  . pravastatin  20 mg Oral Daily  . sodium chloride flush  10-40 mL Intracatheter Q12H  . sodium chloride flush  5 mL Intracatheter Q8H  . vitamin B-12  500 mcg Oral Daily  . Warfarin - Pharmacist Dosing Inpatient   Does not apply q1600   Continuous Infusions: . sodium chloride 10 mL/hr at 05/22/20 1840  . meropenem (MERREM) IV 1 g (05/25/20 1038)     LOS: 9 days    Time spent: 40 minutes    Irine Seal, MD [facility]   To contact the attending provider between 7A-7P or the covering provider during after hours 7P-7A, please log into the web site [email or web address] and access using universal [place] password for that web site. If you do not have the password, please call the hospital operator.  05/25/2020, 12:34 PM

## 2020-05-26 ENCOUNTER — Inpatient Hospital Stay: Payer: Self-pay

## 2020-05-26 DIAGNOSIS — N151 Renal and perinephric abscess: Secondary | ICD-10-CM | POA: Diagnosis not present

## 2020-05-26 DIAGNOSIS — I4819 Other persistent atrial fibrillation: Secondary | ICD-10-CM | POA: Diagnosis not present

## 2020-05-26 DIAGNOSIS — N10 Acute tubulo-interstitial nephritis: Secondary | ICD-10-CM | POA: Diagnosis not present

## 2020-05-26 DIAGNOSIS — N1831 Chronic kidney disease, stage 3a: Secondary | ICD-10-CM | POA: Diagnosis not present

## 2020-05-26 DIAGNOSIS — Z7189 Other specified counseling: Secondary | ICD-10-CM | POA: Diagnosis not present

## 2020-05-26 DIAGNOSIS — K611 Rectal abscess: Secondary | ICD-10-CM | POA: Diagnosis not present

## 2020-05-26 LAB — CBC WITH DIFFERENTIAL/PLATELET
Abs Immature Granulocytes: 0.08 10*3/uL — ABNORMAL HIGH (ref 0.00–0.07)
Basophils Absolute: 0.1 10*3/uL (ref 0.0–0.1)
Basophils Relative: 1 %
Eosinophils Absolute: 0.2 10*3/uL (ref 0.0–0.5)
Eosinophils Relative: 2 %
HCT: 32.8 % — ABNORMAL LOW (ref 39.0–52.0)
Hemoglobin: 10.3 g/dL — ABNORMAL LOW (ref 13.0–17.0)
Immature Granulocytes: 1 %
Lymphocytes Relative: 14 %
Lymphs Abs: 1.4 10*3/uL (ref 0.7–4.0)
MCH: 25.1 pg — ABNORMAL LOW (ref 26.0–34.0)
MCHC: 31.4 g/dL (ref 30.0–36.0)
MCV: 80 fL (ref 80.0–100.0)
Monocytes Absolute: 1 10*3/uL (ref 0.1–1.0)
Monocytes Relative: 10 %
Neutro Abs: 7 10*3/uL (ref 1.7–7.7)
Neutrophils Relative %: 72 %
Platelets: 322 10*3/uL (ref 150–400)
RBC: 4.1 MIL/uL — ABNORMAL LOW (ref 4.22–5.81)
RDW: 18.8 % — ABNORMAL HIGH (ref 11.5–15.5)
WBC: 9.7 10*3/uL (ref 4.0–10.5)
nRBC: 0 % (ref 0.0–0.2)

## 2020-05-26 LAB — PROTIME-INR
INR: 3 — ABNORMAL HIGH (ref 0.8–1.2)
Prothrombin Time: 31.1 seconds — ABNORMAL HIGH (ref 11.4–15.2)

## 2020-05-26 LAB — BASIC METABOLIC PANEL
Anion gap: 7 (ref 5–15)
BUN: 18 mg/dL (ref 8–23)
CO2: 26 mmol/L (ref 22–32)
Calcium: 9.2 mg/dL (ref 8.9–10.3)
Chloride: 102 mmol/L (ref 98–111)
Creatinine, Ser: 1.04 mg/dL (ref 0.61–1.24)
GFR, Estimated: >60 mL/min (ref >60)
Glucose, Bld: 113 mg/dL — ABNORMAL HIGH (ref 70–99)
Potassium: 3.9 mmol/L (ref 3.5–5.1)
Sodium: 135 mmol/L (ref 135–145)

## 2020-05-26 MED ORDER — SODIUM CHLORIDE 0.9 % IV SOLN
1.0000 g | INTRAVENOUS | Status: DC
  Administered 2020-05-27 – 2020-05-28 (×2): 1000 mg via INTRAVENOUS
  Filled 2020-05-26 (×2): qty 1

## 2020-05-26 NOTE — Progress Notes (Signed)
Daily Progress Note   Patient Name: [name]       Date: 05/26/2020 DOB: 01/30/1932  Age: 87 y.o. MRN#: [id number] Attending Physician: Kerney Elbe, DO Primary Care Physician: Shirline Frees, MD Admit Date: 05/15/2020  Reason for Consultation/Follow-up: Establishing goals of care  Subjective: I saw and briefly examined [name] and met today with his wife.  We met in his room, however, he slept through most of encounter.  His wife reports that he has been sleepy today but did have sedative to help him rest last night as he has been restless.  We reviewed his clinical course this admission and discussed options for his care moving forward.  See below.  Length of Stay: 10  Current Medications: Scheduled Meds:  . furosemide  20 mg Oral Daily  . isosorbide mononitrate  15 mg Oral Daily  . pantoprazole  40 mg Oral Q0600  . polyethylene glycol  34 g Oral BID  . pravastatin  20 mg Oral Daily  . sodium chloride flush  10-40 mL Intracatheter Q12H  . sodium chloride flush  5 mL Intracatheter Q8H  . vitamin B-12  500 mcg Oral Daily  . Warfarin - Pharmacist Dosing Inpatient   Does not apply q1600    Continuous Infusions: . sodium chloride 10 mL/hr at 05/22/20 1840  . meropenem (MERREM) IV 1 g (05/26/20 0218)    PRN Meds: acetaminophen, haloperidol lactate, HYDROcodone-acetaminophen, QUEtiapine, sodium chloride flush  Physical Exam         Sleepy.  Lying in bed in no distress Regular work of breathing Chronically ill and weak appearing No significant edema Drain noted to be in place  Vital Signs: BP 140/68 (BP Location: Right Arm)   Pulse (!) 59   Temp 97.9 F (36.6 C) (Oral)   Resp 20   Ht 6' (1.829 m)   Wt 97.1 kg   SpO2 96%   BMI 29.02 kg/m  SpO2:  SpO2: 96 % O2 Device: O2 Device: Room Air O2 Flow Rate: O2 Flow Rate (L/min): 4 L/min  Intake/output summary:   Intake/Output Summary (Last 24 hours) at 05/26/2020 0923 Last data filed at 05/26/2020 0600 Gross per 24 hour  Intake 210 ml  Output 745 ml  Net -535 ml   LBM: Last BM Date: 05/24/20 Baseline Weight: Weight:  97.1 kg Most recent weight: Weight: 97.1 kg       Palliative Assessment/Data:      Patient Active Problem List   Diagnosis Date Noted  . Renal abscess   . Perirectal abscess 05/15/2020  . Pyelonephritis of right kidney 05/15/2020  . Presence of IVC filter 05/15/2020  . Constipation, chronic 05/15/2020  . Cysts of right kidney 05/15/2020  . Severe aortic stenosis   . OSA on CPAP   . History of prostate cancer   . History of peptic ulcer   . History of kidney stones   . GERD   . CAD (coronary artery disease), native coronary artery   . Arthritis   . Bulbous urethral stricture 12/03/2019  . Internal hemorrhoids 09/11/2018  . Noise effect on both inner ears 08/14/2018  . Presbycusis of both ears 08/14/2018  . 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 CKD (chronic kidney disease), stage III ([place])   . Chronic combined systolic and diastolic heart failure ([place]) 11/19/2017  . Cardiac pacemaker in situ 03/14/2017  . 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) 01/24/2016  . OSA treated with BiPAP 11/15/2015  . Personal history of malignant neoplasm of prostate 04/05/2015  . Hypertensive heart disease 04/05/2015  . 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[place]) 04/05/2015  . CAD (coronary artery disease), native coronary artery 04/05/2015  . Aortic valve stenosis 04/05/2015  . Persistent atrial fibrillation ([place]) 09/19/2014  . Moderate persistent asthma without complication 34/19/6222  . Atrial fibrillation, persistent ([place]) 09/19/2014  . Angina pectoris ([place]) 09/18/2014  . Urinary incontinence 03/04/2014  . Obesity  (BMI 30-39.9)   . Long term (current) use of anticoagulants 10/06/2011  . Mixed hyperlipidemia 02/24/2010  . Gastro-esophageal reflux disease without esophagitis 09/24/2009    Palliative Care Assessment & Plan   Patient Profile: 87 year old gentleman with a history of prostate cancer status post prostatectomy 2016, history of atrial fibrillation recurrent DVT, history of PE history of aortic stenosis status post aortic valve replacement coronary artery disease hypertension has a pacemaker has stage III chronic kidney disease.  Patient presented with rectal pain found to have perirectal abscess.  Found to have renal abscess status post drain placement as well as has had acute blood loss anemia and melena in this hospitalization.  Recommendations/Plan:  DNR/DNI  Continue to treat treatable conditions.  [name] wife expresses that her husband has always expressed wanting to focus on quality of life rather than prolonging life at all cost.  She continues to see the changes in his overall nutrition, cognition, and functional status.  They are working together to try to determine how to best balance quality of life moving forward.  She does not believe he would want overly aggressive care if he develops terminal condition, however, his situation is complicated and he has potentially treatable conditions at this point.  We will continue to monitor and progress conversation based on his clinical course.  There is certainly open to all available help at home would recommend outpatient palliative care at discharge.  Code Status:    Code Status Orders  (From admission, onward)         Start     Ordered   05/24/20 1118  Do not attempt resuscitation (DNR)  Continuous       Question Answer Comment  In the event of cardiac or respiratory ARREST Do not call a "code blue"   In the event of cardiac or respiratory ARREST Do not perform  Intubation, CPR, defibrillation or ACLS   In the event of  cardiac or respiratory ARREST Use medication by any route, position, wound care, and other measures to relive pain and suffering. May use oxygen, suction and manual treatment of airway obstruction as needed for comfort.      05/24/20 1117        Code Status History    Date Active Date Inactive Code Status Order ID Comments User Context   05/15/2020 2341 05/24/2020 1117 Full Code [id number]  Orene Desanctis, DO ED   09/12/2019 1151 09/12/2019 1946 Full Code [id number]  Sherren Mocha, MD Inpatient   03/18/2018 1306 03/22/2018 1738 Full Code [id number]  Sherren Mocha, MD Inpatient   02/28/2018 1508 02/28/2018 2010 Full Code [id number]  Sherren Mocha, MD Inpatient   03/14/2017 1700 03/15/2017 1322 Full Code [id number]  Evans Lance, MD Inpatient   09/19/2014 0516 09/19/2014 1505 Full Code [id number]  Alfonso Ramus, MD Inpatient   10/09/2012 2042 10/11/2012 2228 Full Code [id number]  Gearlean Alf, MD Inpatient   09/28/2011 1943 10/04/2011 1600 Full Code [id number]  Jillyn Hidden, RN Inpatient   Advance Care Planning Activity    Advance Directive Documentation   Flowsheet Row Most Recent Value  Type of Advance Directive Healthcare Power of Attorney  Pre-existing out of facility DNR order (yellow form or pink MOST form) --  "MOST" Form in Place? --       Prognosis:   Unable to determine  Discharge Planning:  To Be Determined  Care plan was discussed with wife  Thank you for allowing the Palliative Medicine Team to assist in the care of this patient.   Total Time 25 Prolonged Time Billed No      Greater than 50%  of this time was spent counseling and coordinating care related to the above assessment and plan.  Micheline Rough, MD  Please contact Palliative Medicine Team phone at [phone] for questions and concerns.

## 2020-05-26 NOTE — Progress Notes (Signed)
PHARMACY CONSULT NOTE FOR:  OUTPATIENT  PARENTERAL ANTIBIOTIC THERAPY (OPAT)  Indication: ESBL renal abscess Regimen: ertapenem 1g IV daily  End date: 06/17/2020  IV antibiotic discharge orders are pended. To discharging provider:  please sign these orders via discharge navigator,  Select New Orders & click on the button choice - Manage This Unsigned Work.     Thank you for allowing pharmacy to be a part of this patient's care.  Phillis Haggis 05/26/2020, 10:57 AM

## 2020-05-26 NOTE — Progress Notes (Signed)
[facility] for Infectious Disease  Date of Admission:  05/15/2020           Reason for visit: Follow up on perirectal and renal abscess  Current antibiotics: Meropenem 4/13--present  Previous antibiotics: Piperacillin tazobactam 4/9-4/13  ASSESSMENT & PLAN:    #Perirectal abscess secondary to ESBL: Status post I&D 4/10 #Pyelonephritis, right renal subscapular abscess secondary to ESBL E. coli: Status post IR drain placement 05/18/20.  Interval CT scan done 05/24/2020 shows enlarging 2 cm and 1.6 cm lower pole collection that may represent developing small perinephric abscesses.  IR has reviewed with no further intervention plans at this time and recommending follow-up scan in 1 to 2 weeks with monitoring of drain output.  -continue meropenem today -transition to ertapenem 1gm daily tomorrow in anticipation of discharge.  Opting for IV therapy in setting of un-drained new collections and hx of Bactrim allergy and Cipro not the best choice for him due to other comorbidities -tentative plan for 3 weeks more therapy but final duration TBD on drain management.  Will need to have IR follow up in drain clinic as well -PICC line -see OPAT note below  #Nonobstructing nephrolithiasis: Urology evaluated 4/18 with no further inpatient recommendations and advised outpatient follow-up with his primary urologist. -Outpatient urology follow up  #CKD: Creatinine improving with measurement of 1.04 this morning. -Monitor   Diagnosis: Perirectal and renal abscess  Culture Result: ESBL E coli  Allergies  Allergen Reactions  . Darifenacin Nausea Only and Other (See Comments)    dry mouth and dizziness ENABLEX Dizziness Pt denies  . Codeine Other (See Comments)  . Sulfamethoxazole-Trimethoprim Other (See Comments)  . Lisinopril Cough    Pt denies    OPAT Orders Discharge antibiotics to be given via PICC line Discharge antibiotics: Per pharmacy protocol Ertapenem 1gm  daily  Duration: 3 weeks  End Date: 06/17/20  [facility] Care Per Protocol:  Home health RN for IV administration and teaching; PICC line care and labs.    Labs weekly while on IV antibiotics: _x_ CBC with differential __ BMP _x_ CMP __ CRP __ ESR __ Vancomycin trough __ CK  __ Please pull PIC at completion of IV antibiotics x__ Please leave PIC in place until doctor has seen patient or been notified  Fax weekly labs to [phone]  Clinic Follow Up Appt: Dr Linus Salmons at [facility] on 06/04/20    Principal Problem:   Perirectal abscess Active Problems:   Hypertensive heart disease   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)   Gastro-esophageal reflux disease without esophagitis   Long term (current) use of anticoagulants   Persistent atrial fibrillation ([facility])   CKD (chronic kidney disease), stage III ([facility])   S/P TAVR (transcatheter aortic valve replacement)   OSA on CPAP   CAD (coronary artery disease), native coronary artery   Atrial fibrillation, persistent ([place])   Pyelonephritis of right kidney   Presence of IVC filter   Constipation, chronic   Cysts of right kidney   Renal abscess    MEDICATIONS:    Scheduled Meds: . furosemide  20 mg Oral Daily  . isosorbide mononitrate  15 mg Oral Daily  . pantoprazole  40 mg Oral Q0600  . polyethylene glycol  34 g Oral BID  . pravastatin  20 mg Oral Daily  . sodium chloride flush  10-40 mL Intracatheter Q12H  . sodium chloride flush  5 mL Intracatheter Q8H  . vitamin B-12  500 mcg Oral Daily  . Warfarin - Pharmacist Dosing Inpatient   Does not apply q1600   Continuous Infusions: . sodium chloride 10 mL/hr at 05/22/20 1840  . meropenem (MERREM) IV 1 g (05/26/20 0218)   PRN Meds:.acetaminophen, haloperidol lactate, HYDROcodone-acetaminophen, QUEtiapine, sodium chloride flush  SUBJECTIVE:   24 hour events:  No acute events are noted overnight Afebrile T-max 99.4 WBC normal Creatinine  stable Continued on meropenem IR with no further interventions planned Drain output measured as 45 cc last 24 hours  Patient feeling better today.  Denies pain, fevers, chills.  Tolerating PO intake.  Anxious to go home.   Review of Systems  All other systems reviewed and are negative.     OBJECTIVE:   Blood pressure 140/68, pulse (!) 59, temperature 97.9 F (36.6 C), temperature source Oral, resp. rate 20, height 6' (1.829 m), weight 97.1 kg, SpO2 96 %. Body mass index is 29.02 kg/m.  Physical Exam Constitutional:      Appearance: Normal appearance.  HENT:     Head: Normocephalic and atraumatic.  Pulmonary:     Effort: Pulmonary effort is normal. No respiratory distress.  Neurological:     General: No focal deficit present.     Mental Status: He is alert and oriented to person, place, and time.  Psychiatric:        Mood and Affect: Mood normal.        Behavior: Behavior normal.      Lab Results: Lab Results  Component Value Date   WBC 9.7 05/26/2020   HGB 10.3 (L) 05/26/2020   HCT 32.8 (L) 05/26/2020   MCV 80.0 05/26/2020   PLT 322 05/26/2020    Lab Results  Component Value Date   NA 135 05/26/2020   K 3.9 05/26/2020   CO2 26 05/26/2020   GLUCOSE 113 (H) 05/26/2020   BUN 18 05/26/2020   CREATININE 1.04 05/26/2020   CALCIUM 9.2 05/26/2020   GFRNONAA >60 05/26/2020   GFRAA 51 (L) 09/08/2019    Lab Results  Component Value Date   ALT 8 05/19/2020   AST 16 05/19/2020   ALKPHOS 68 05/19/2020   BILITOT 1.1 05/19/2020       Component Value Date/Time   CRP <0.5 04/22/2013 1106       Component Value Date/Time   ESRSEDRATE 4 04/22/2013 1106     I have reviewed the micro and lab results in Epic.  Imaging: CT ABDOMEN PELVIS W CONTRAST  Result Date: 05/25/2020 CLINICAL DATA:  Enlarging right subcapsular perinephric fluid collection, status post drain catheter placement 05/18/2020 EXAM: CT ABDOMEN AND PELVIS WITH CONTRAST TECHNIQUE: Multidetector CT  imaging of the abdomen and pelvis was performed using the standard protocol following bolus administration of intravenous contrast. CONTRAST:  171m OMNIPAQUE IOHEXOL 300 MG/ML  SOLN COMPARISON:  05/15/2020 and previous FINDINGS: Lower chest: Trace pericardial effusions as before. Transvenous pacing leads partially visualized. Status post AVR. Trace pericardial effusion. Patchy subsegmental atelectasis/consolidation posteriorly in the lung bases. Hepatobiliary: Liver unremarkable. Several subcentimeter partially calcified stones in the dependent aspect of the nondilated gallbladder. Pancreas: Unremarkable. No pancreatic ductal dilatation or surrounding inflammatory changes. Spleen: Normal in size without focal abnormality. Adrenals/Urinary Tract: Adrenal glands unremarkable. 1.4 cm mid left renal cyst. Urinary bladder nondistended. On the right, interval placement of percutaneous drain catheter with partial decompression of the perinephric collection, residual component 4.5 x 1.8 cm. Right nephrolithiasis, largest stone 1.9 cm centrally in the collecting system, nonobstructive. High attenuation material in the distended  renal collecting system. Ureters decompressed. Enlarging 2 cm and 1.6 cm collections adjacent to the lower pole may represent developing small perinephric abscesses. Regional inflammatory changes persist. Stomach/Bowel: Stomach and small bowel are nondilated. The colon is decompressed, unremarkable. Vascular/Lymphatic: Heavy extensive aortic and iliofemoral calcified atheromatous plaque without aneurysm or evident high-grade stenosis. No abdominal or pelvic adenopathy. Infrarenal IVC filter without suggestion of caval thrombosis. Reproductive: Previous prostatectomy. Other: No ascites.  No free air. Musculoskeletal: Multilevel lumbar spondylitic change. Negative for fracture or worrisome bone lesion. For IMPRESSION: 1. Partial decompression of right perinephric collection post percutaneous drain  catheter placement. 2. Enlarging 2 cm and 1.6 cm lower pole collections may represent developing small perinephric abscesses. 3. Right nephrolithiasis with possible pyonephrosis 4. Cholelithiasis. Aortic Atherosclerosis (ICD10-I70.0). Electronically Signed   By: Lucrezia Europe M.D.   On: 05/25/2020 08:23     Imaging independently reviewed in Epic.    Rayn[facility]ious [facility] [phone] pager 05/26/2020, 9:03 AM  I spent greater than 35 minutes with the patient including greater than 50% of time in face to face counsel of the patient and in coordination of their care.

## 2020-05-26 NOTE — Progress Notes (Addendum)
ANTICOAGULATION CONSULT NOTE - Follow up [facility] for Warfarin Indication: history of DVT/PE, atrial fibrillation   Allergies  Allergen Reactions  . Darifenacin Nausea Only and Other (See Comments)    dry mouth and dizziness ENABLEX Dizziness Pt denies  . Codeine Other (See Comments)  . Sulfamethoxazole-Trimethoprim Other (See Comments)  . Lisinopril Cough    Pt denies    Patient Measurements: Height: 6' (182.9 cm) Weight: 97.1 kg (214 lb) IBW/kg (Calculated) : 77.6  Labs: Recent Labs    05/24/20 0829 05/25/20 0512 05/26/20 0408  HGB 11.3* 11.0* 10.3*  HCT 35.2* 34.5* 32.8*  PLT 319 355 322  LABPROT 25.9* 30.4* 31.1*  INR 2.4* 2.9* 3.0*  CREATININE 1.17 0.99 1.04    Estimated Creatinine Clearance: 60.4 mL/min (by C-G formula based on SCr of 1.04 mg/dL).  Medications:  Home warfarin regimen: 2.5mg  PO daily-last dose reported 05/14/2020 at 2100.  INR supratherapeutic on admission at 3.3.   Assessment: 70 y/oM with PMH of prostate CA, aortic stenosis s/p TAVR, atrial fibrillation, history of recurrent  (remote) DVT/PE s/p IVC filter on warfarin who presented with rectal bleeding, supratherapeutic INR, and anemia.  Significant events:  4/9 Vitamin K 2.5mg  PO x 1  4/10: INR 3.4 in AM; Urgent I&D of perirectal abscess, given KCentra and Vitamin K 10mg  IV x 1 pre-procedure. Postprocedure INR 1.4 and Warfarin resumed in PM  4/11: warfarin held for drainage of R subcapsular renal fluid collection  4/14: resuming warfarin  Today, 05/26/2020:  INR 3, continues to be therapeutic but rising and now at upper end of goal range   Hgb low/relatively stable at 10.3, Plt stable WNL  No further rectal bleeding noted or procedures planned  Meal intake not charted   No major DDIs with warfarin noted  Goal of Therapy:  INR 2-3 Monitor platelets by anticoagulation protocol: Yes   Plan:   No warfarin today  Daily PT/INR  Monitor CBC and for s/sx of  bleeding   Lindell Spar, PharmD, BCPS 05/26/2020 1:00 PM

## 2020-05-26 NOTE — Progress Notes (Signed)
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  Subsequently was also found to have a 6 g drop in his hemoglobin with heme positive stools on presentation.  Further work-up also revealed the patient had a renal abscess status post drain placement with cultures growing out ESBL E. coli; patient underwent perirectal abscess I&D on 05/16/2020.  Further work-up showed that he had a right renal subscapular fluid collection and renal abscess.  ID was consulted and recommending IV meropenem given that the drain placement was positive for ESBL E. coli.  On IV meropenem today and transitioning to ertapenem 1 g daily tomorrow anticipation for discharge and recommending PICC line placement.  Care met with the patient and recommending DNR/DNI but patient will still focus on quality of life and we will continue to treat the treatable conditions.  Assessment & Plan:   Principal Problem:   Perirectal abscess Active Problems:   Hypertensive heart disease   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)   Gastro-esophageal reflux disease without esophagitis   Long term (current) use of anticoagulants   Persistent atrial fibrillation (HCC)   CKD (chronic kidney disease), stage III (HCC)   S/P TAVR (transcatheter aortic valve replacement)   OSA on CPAP   CAD (coronary artery disease), native coronary  artery   Atrial fibrillation, persistent (HCC)   Pyelonephritis of right kidney   Presence of IVC filter   Constipation, chronic   Cysts of right kidney   Renal abscess  Perirectal Abscess secondary to ESBL E. coli status post I&D 4/10 -Noted on CT abdomen and pelvis. -General surgery consulted and patient underwent incision and drainage (4/10) per general surgery. -Cultures from incision and drainage with abundant ESBL E. coli sensitive to Zosyn, Cipro, gentamicin, imipenem, Bactrim and resistant to Unasyn, ampicillin, cefazolin, ceftazidime, Rocephin with intermediate sensitivity to cefepime. -Due to concurrent renal abscess, ID consulted and IV antibiotics changed to IV Merrem and will change to .    -Continue sitz bath's as recommended per general surgery.   -General surgery was following but have signed off.   -Outpatient follow-up with general surgery when Discharged  Right renal subcapsular fluid collection/renal abscess/ Persistent pyelonephritis/nonobstructing nephrolithiasis. -Patient noted to have a right renal subcapsular fluid collection/renal abscess, -Patient noted to have had a UTI approximately a month ago placed on Bactrim later reevaluated in the ED placed on cefpodoxime.  Urine cultures with no growth. -Cultures from this admission negative. -Unable to perform MRI secondary to pacemaker. -Dr Wyline Copas discussed with urology who recommended drainage per IR. -Status post drain placement per IR with purulent fluid noted and cultures positive for ESBL E. coli.   -Per IR once output from drainage < 10cc, will need repeat CT scan. -Repeat CT abdomen and pelvis ordered and done (05/24/2020) with partial decompression of right perinephric collection post percutaneous drain catheter placement, enlarging 2 cm 1.6 cm lower pole collections may represent developing small perinephric abscesses, right nephrolithiasis  with possible pyelonephrosis, cholelithiasis.  -There was concern on  05/22/2020, about drain insertion site concerning for infection however assessed insertion site of drain myself which is nonerythematous, no warmth, no significant tenderness to palpation. -IR recommended urology consultation/input during this hospitalization and as such urology saw patient in consultation and recommended continue drain per IR, antibiotic recommendations per ID, no other intervention necessary at this time, outpatient follow-up with primary urologist, Dr. Burnell Blanks post discharge. -Continue IV Merrem for ESBL per ID recommendations but will be changed to IV Ertapenem in the AM with PICC line placed in anticipation of D/C  -Will await ID and IR recommendations pending the evaluation of repeat CT. the CT scan on 419 and Dr. Annamaria Boots recommended no further intervention needed at this time -Interventional Radiology recommending continuing drain flushing 3 times daily and output recording every shift and dressing changes as needed and they will consider additional imaging when output less than 10 mL for 24 hours and if patient is to be discharged with a drain placed he will have a drain clinic appointment -ID following and appreciate input and recommendations.  Patient to be changed to ertapenem tomorrow to 1 g daily in anticipation for discharge and infectious diseases are opting for IV therapy in the setting of undrained collections and history of Bactrim allergy; they are recommending a plan for 3 weeks more therapy but final duration to be determined on drain management -OPAT orders have been placed  Normocytic Anemia with Associated melena -Concern for acute GI bleed in the setting of Coumadin use. -Patient noted to be followed closely at [facility] for GI for history of hemorrhoids in the past. -Hemoglobin noted to have gone down to as low as 9.7 from 16.2 (04/27/2020). -Hemoglobin/Hct stable at 10.3/32.8 now -Patient with no overt bleeding. -Stools noted to be heme positive. -Dr. Wyline Copas  discussed with GI on-call, and given recent surgical drainage of perirectal abscess recommendation is to hold off on inpatient endoscopy for now and follow-up in the outpatient setting when more stable. -Coumadin has been resumed and INR is therapeutic at 3.0  History of persistent atrial fibrillation -CHA2DSVASC score 5 -Coumadin was held initially during the hospitalization, due to concerns for anemia/melena.   -Coumadin resumed.   -INR at 3.0 today .   -Hemoglobin stable at 10.3.   -Coumadin per pharmacy dosing  Acquired Thrombophilia -In the setting of A Fib and recurrent DVT/PE  History of recurrent DVT/PE with supratherapeutic INR -Patient noted to have a history of provoked DVT following prostatectomy status post IVC placement afterwards. -Patient noted to have developed bacteremia and PE and DVT and subsequently placed on warfarin. -Patient's PE and DVT noted to be remote. -Coumadin had held initially due to concern for dark stools .   -Coumadin resumed.   -INR at 3.0 today .   -Hemoglobin stable at 10.3.   -Coumadin per pharmacy.  History of heart block status post PPM -Continue Outpatient follow-up with cardiology.    History of aortic stenosis status post TAVR -Appears to be a bioprosthetic valve. -INR as above at 3.0 -Outpatient follow-up.  Coronary artery disease status post stent -Initially Lasix and Norvasc held due to borderline blood pressure.   -Continued Imdur, pravastatin 20 mg p.o. daily -Resumed Furosemide 20 mg po Daily .    OSA -Patient noted to be refusing CPAP at bedtime and has been changed to as needed.  Chronic kidney disease stage IIIa -Patient's BUNs/creatinine has been relatively stable and actually improved from earlier on in  the admission next-patient's BUN/creatinine went from 20/1.38 on 05/20/2020 and today is 18/1.04 -Avoid further nephrotoxic medications, contrast dyes, hypotension and renally dose medications -Repeat CMP in  a.m.  Borderline blood pressure -Patient noted to have borderline blood pressure 05/21/2020.   -Continued Imdur.  Resume home regimen Lasix. -Continue to hold Norvasc. -Continue to monitor blood pressures per protocol -Last blood pressure reading was   140/68 but earlier this morning was 96/56  Constipation -Resolved on current bowel regimen.   -Had a bowel movement the day before yesterday but not yesterday and thinks he will have a bowel movement today -Polyethylene glycol 34 g p.o. twice daily  Nonobstructing nephrolithiasis -Urology evaluated with no further inpatient recommendations and advised follow-up with primary urologist  GERD  -Continue with PPI with pantoprazole 40 g p.o. daily  Overweight -Estimated body mass index is 29.02 kg/m as calculated from the following:   Height as of this encounter: 6' (1.829 m).   Weight as of this encounter: 97.1 kg.   DVT prophylaxis: Anticoagulated with Coumadin Code Status: DO NOT RESUSCITATE Family Communication: Spoke with wife [name] at bedside  Disposition Plan: Pending ID Clearance as well as IR clearance; PT recommending Home Health PT with 24 Hour Supervision  Status is: Inpatient  Remains inpatient appropriate because:Unsafe d/c plan, IV treatments appropriate due to intensity of illness or inability to take PO and Inpatient level of care appropriate due to severity of illness   Dispo: The patient is from: Home              Anticipated d/c is to: Home              Patient currently is not medically stable to d/c.   Difficult to place patient No  Consultants:   Infectious Diseases  Interventional Radiology   Palliative Care Medicine   Urology  General Surgery  Case was discussed with gastroenterology  Procedures:   CT abdomen and pelvis 05/15/2020  Placement of 12 French percutaneous drainage catheter with aspiration of 135 mL of frankly pleural fluid from subscapular perinephric fluid collection-Per IR, Dr.  Laurence Ferrari 05/18/2020  Incision and drainage of perirectal abscess per general surgery, Dr. Donne Hazel 05/16/2020  Antimicrobials:  Anti-infectives (From admission, onward)   Start     Dose/Rate Route Frequency Ordered Stop   05/27/20 1000  ertapenem (INVANZ) 1,000 mg in sodium chloride 0.9 % 100 mL IVPB        1 g 200 mL/hr over 30 Minutes Intravenous Every 24 hours 05/26/20 1057     05/22/20 1800  meropenem (MERREM) 1 g in sodium chloride 0.9 % 100 mL IVPB        1 g 200 mL/hr over 30 Minutes Intravenous Every 8 hours 05/22/20 1000 05/26/20 2359   05/19/20 2200  meropenem (MERREM) 1 g in sodium chloride 0.9 % 100 mL IVPB  Status:  Discontinued        1 g 200 mL/hr over 30 Minutes Intravenous Every 12 hours 05/19/20 1406 05/22/20 1000   05/16/20 1800  piperacillin-tazobactam (ZOSYN) IVPB 3.375 g  Status:  Discontinued        3.375 g 12.5 mL/hr over 240 Minutes Intravenous Every 8 hours 05/16/20 1335 05/19/20 1406   05/16/20 0800  piperacillin-tazobactam (ZOSYN) IVPB 3.375 g  Status:  Discontinued        3.375 g 12.5 mL/hr over 240 Minutes Intravenous Every 8 hours 05/16/20 0059 05/16/20 1304   05/15/20 2315  piperacillin-tazobactam (ZOSYN) IVPB 3.375 g  3.375 g 100 mL/hr over 30 Minutes Intravenous  Once 05/15/20 2311 05/16/20 0136        Subjective: Seen and examined at bedside and states that he has no pain.  No nausea or vomiting.  Denies any lightheadedness or dizziness.  No other concerns or complaints at this time and states that he feels better today.  Objective: Vitals:   05/25/20 1302 05/25/20 1307 05/25/20 2330 05/26/20 0432  BP: (!) 96/56 130/61 128/60 140/68  Pulse: 69 66 62 (!) 59  Resp: '18 20 19 20  ' Temp: 99.4 F (37.4 C) 99.4 F (37.4 C) 98.5 F (36.9 C) 97.9 F (36.6 C)  TempSrc: Oral  Oral Oral  SpO2: 97% 95% 97% 96%  Weight:      Height:        Intake/Output Summary (Last 24 hours) at 05/26/2020 1243 Last data filed at 05/26/2020 0600 Gross per  24 hour  Intake 210 ml  Output 745 ml  Net -535 ml   Filed Weights   05/15/20 1923  Weight: 97.1 kg   Examination: Physical Exam:  Constitutional: WN/WD overweight Caucasian male currently in NAD and appears calm and comfortable Eyes: Lids and conjunctivae normal, sclerae anicteric  ENMT: External Ears, Nose appear normal.  Hard of hearing but hears better on the right side than he does on left Neck: Appears normal, supple, no cervical masses, normal ROM, no appreciable thyromegaly; no JVD Respiratory: Diminished to auscultation bilaterally, no wheezing, rales, rhonchi or crackles. Normal respiratory effort and patient is not tachypenic. No accessory muscle use.  Unlabored breathing Cardiovascular: RRR, no murmurs / rubs / gallops. S1 and S2 auscultated.  Trace extremity edema Abdomen: Soft, non-tender, distended secondary to body habitus. Bowel sounds positive.  GU: Deferred. Musculoskeletal: No clubbing / cyanosis of digits/nails. No joint deformity upper and lower extremities.  Has a drain on the backside on the rear Skin: No rashes, lesions, ulcers on limited skin evaluation. No induration; Warm and dry.  Neurologic: CN 2-12 grossly intact with no focal deficits. Romberg sign and cerebellar reflexes not assessed.  Psychiatric: Normal judgment and insight. Alert and oriented x 3. Normal mood and appropriate affect.   Data Reviewed: I have personally reviewed following labs and imaging studies  CBC: Recent Labs  Lab 05/22/20 0512 05/23/20 0711 05/24/20 0829 05/25/20 0512 05/26/20 0408  WBC 10.7* 10.2 10.1 10.8* 9.7  NEUTROABS  --   --  7.9* 8.1* 7.0  HGB 11.1* 10.8* 11.3* 11.0* 10.3*  HCT 35.2* 34.0* 35.2* 34.5* 32.8*  MCV 80.2 80.2 80.5 80.4 80.0  PLT 343 351 319 355 940   Basic Metabolic Panel: Recent Labs  Lab 05/20/20 0517 05/21/20 0517 05/22/20 0512 05/23/20 0711 05/24/20 0829 05/25/20 0512 05/26/20 0408  NA 136 137 133* 134* 137 137 135  K 3.9 4.2 4.2 4.6  4.3 4.2 3.9  CL 102 106 102 102 102 104 102  CO2 '27 25 23 24 27 26 26  ' GLUCOSE 130* 119* 117* 122* 182* 113* 113*  BUN '20 22 23 21 20 15 18  ' CREATININE 1.38* 1.38* 1.07 1.11 1.17 0.99 1.04  CALCIUM 9.4 9.2 9.2 9.5 9.7 9.4 9.2  MG 1.9 2.0  --   --   --   --   --    GFR: Estimated Creatinine Clearance: 60.4 mL/min (by C-G formula based on SCr of 1.04 mg/dL). Liver Function Tests: No results for input(s): AST, ALT, ALKPHOS, BILITOT, PROT, ALBUMIN in the last 168 hours. No results for  input(s): LIPASE, AMYLASE in the last 168 hours. Recent Labs  Lab 05/23/20 0711  AMMONIA 18   Coagulation Profile: Recent Labs  Lab 05/22/20 0512 05/23/20 0711 05/24/20 0829 05/25/20 0512 05/26/20 0408  INR 1.6* 1.9* 2.4* 2.9* 3.0*   Cardiac Enzymes: No results for input(s): CKTOTAL, CKMB, CKMBINDEX, TROPONINI in the last 168 hours. BNP (last 3 results) No results for input(s): PROBNP in the last 8760 hours. HbA1C: No results for input(s): HGBA1C in the last 72 hours. CBG: No results for input(s): GLUCAP in the last 168 hours. Lipid Profile: No results for input(s): CHOL, HDL, LDLCALC, TRIG, CHOLHDL, LDLDIRECT in the last 72 hours. Thyroid Function Tests: No results for input(s): TSH, T4TOTAL, FREET4, T3FREE, THYROIDAB in the last 72 hours. Anemia Panel: No results for input(s): VITAMINB12, FOLATE, FERRITIN, TIBC, IRON, RETICCTPCT in the last 72 hours. Sepsis Labs: No results for input(s): PROCALCITON, LATICACIDVEN in the last 168 hours.  Recent Results (from the past 240 hour(s))  Aerobic/Anaerobic Culture (surgical/deep wound)     Status: None   Collection Time: 05/18/20 12:01 PM   Specimen: Abscess  Result Value Ref Range Status   Specimen Description   Final    ABSCESS RT KIDNEY Performed at [facility] [address], [place], [place] [address]    Special Requests   Final    Normal Performed at [facility], [place] [address]  [place], [place] [address]    Gram Stain   Final    ABUNDANT WBC PRESENT,BOTH PMN AND MONONUCLEAR FEW GRAM NEGATIVE RODS    Culture   Final    ABUNDANT ESCHERICHIA COLI Confirmed Extended Spectrum Beta-Lactamase Producer (ESBL).  In bloodstream infections from ESBL organisms, carbapenems are preferred over piperacillin/tazobactam. They are shown to have a lower risk of mortality. NO ANAEROBES ISOLATED Performed at [facility], [place] [address], [place], [place] [address]    Report Status 05/23/2020 FINAL  Final   Organism ID, Bacteria ESCHERICHIA COLI  Final      Susceptibility   Escherichia coli - MIC*    AMPICILLIN >=32 RESISTANT Resistant     CEFAZOLIN >=64 RESISTANT Resistant     CEFEPIME 8 INTERMEDIATE Intermediate     CEFTAZIDIME RESISTANT Resistant     CEFTRIAXONE >=64 RESISTANT Resistant     CIPROFLOXACIN <=0.25 SENSITIVE Sensitive     GENTAMICIN <=1 SENSITIVE Sensitive     IMIPENEM <=0.25 SENSITIVE Sensitive     TRIMETH/SULFA <=20 SENSITIVE Sensitive     AMPICILLIN/SULBACTAM >=32 RESISTANT Resistant     PIP/TAZO 8 SENSITIVE Sensitive     * ABUNDANT ESCHERICHIA COLI     RN Pressure Injury Documentation:     Estimated body mass index is 29.02 kg/m as calculated from the following:   Height as of this encounter: 6' (1.829 m).   Weight as of this encounter: 97.1 kg.  Malnutrition Type:   Malnutrition Characteristics:   Nutrition Interventions:     Radiology Studies: [place] EKG SITE RITE  Result Date: 05/26/2020 If Site Rite image not attached, placement could not be confirmed due to current cardiac rhythm.  Scheduled Meds: . furosemide  20 mg Oral Daily  . isosorbide mononitrate  15 mg Oral Daily  . pantoprazole  40 mg Oral Q0600  . polyethylene glycol  34 g Oral BID  . pravastatin  20 mg Oral Daily  . sodium chloride flush  10-40 mL Intracatheter Q12H  . sodium chloride flush  5 mL Intracatheter Q8H  . vitamin  B-12  500 mcg Oral Daily  . Warfarin -  Pharmacist Dosing Inpatient   Does not apply q1600   Continuous Infusions: . sodium chloride 10 mL/hr at 05/22/20 1840  . [START ON 05/27/2020] ertapenem    . meropenem (MERREM) IV 1 g (05/26/20 1053)    LOS: 10 days   Kerney Elbe, DO [facility] PAGER is on [place]  If 7PM-7AM, please contact night-coverage [email or web address]

## 2020-05-26 NOTE — Progress Notes (Incomplete)
Daily Progress Note   Patient Name: [name]       Date: 05/26/2020 DOB: 15-May-1931  Age: 87 y.o. MRN#: [id number] Attending Physician: Kerney Elbe, DO Primary Care Physician: Shirline Frees, MD Admit Date: 05/15/2020  Reason for Consultation/Follow-up: Establishing goals of care  Subjective: I saw and briefly examined [name] and met today with his wife.  We met in his room, however, he slept through most of encounter.  His wife reports that he has been sleepy today but did have sedative to help him rest last night as he has been restless.  We reviewed his clinical course this admission and discussed options for his care moving forward.  See below.  Length of Stay: 10  Current Medications: Scheduled Meds:  . furosemide  20 mg Oral Daily  . isosorbide mononitrate  15 mg Oral Daily  . pantoprazole  40 mg Oral Q0600  . polyethylene glycol  34 g Oral BID  . pravastatin  20 mg Oral Daily  . sodium chloride flush  10-40 mL Intracatheter Q12H  . sodium chloride flush  5 mL Intracatheter Q8H  . vitamin B-12  500 mcg Oral Daily  . Warfarin - Pharmacist Dosing Inpatient   Does not apply q1600    Continuous Infusions: . sodium chloride 10 mL/hr at 05/22/20 1840  . [START ON 05/27/2020] ertapenem    . meropenem (MERREM) IV 1 g (05/26/20 1053)    PRN Meds: acetaminophen, haloperidol lactate, HYDROcodone-acetaminophen, QUEtiapine, sodium chloride flush  Physical Exam         Sleepy.  Lying in bed in no distress Regular work of breathing Chronically ill and weak appearing No significant edema Drain noted to be in place  Vital Signs: BP (!) 120/54 (BP Location: Right Arm)   Pulse 67   Temp 97.7 F (36.5 C) (Oral)   Resp 16   Ht 6' (1.829 m)   Wt 97.1 kg    SpO2 98%   BMI 29.02 kg/m  SpO2: SpO2: 98 % O2 Device: O2 Device: Room Air O2 Flow Rate: O2 Flow Rate (L/min): 4 L/min  Intake/output summary:   Intake/Output Summary (Last 24 hours) at 05/26/2020 1726 Last data filed at 05/26/2020 1253 Gross per 24 hour  Intake 200 ml  Output 1145 ml  Net -945 ml  LBM: Last BM Date: 05/25/20 Baseline Weight: Weight: 97.1 kg Most recent weight: Weight: 97.1 kg       Palliative Assessment/Data:      Patient Active Problem List   Diagnosis Date Noted  . Renal abscess   . Perirectal abscess 05/15/2020  . Pyelonephritis of right kidney 05/15/2020  . Presence of IVC filter 05/15/2020  . Constipation, chronic 05/15/2020  . Cysts of right kidney 05/15/2020  . Severe aortic stenosis   . OSA on CPAP   . History of prostate cancer   . History of peptic ulcer   . History of kidney stones   . GERD   . CAD (coronary artery disease), native coronary artery   . Arthritis   . Bulbous urethral stricture 12/03/2019  . Internal hemorrhoids 09/11/2018  . Noise effect on both inner ears 08/14/2018  . Presbycusis of both ears 08/14/2018  . 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 CKD (chronic kidney disease), stage III ([place])   . Chronic combined systolic and diastolic heart failure ([place]) 11/19/2017  . Cardiac pacemaker in situ 03/14/2017  . 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) 01/24/2016  . OSA treated with BiPAP 11/15/2015  . Personal history of malignant neoplasm of prostate 04/05/2015  . Hypertensive heart disease 04/05/2015  . 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[place]) 04/05/2015  . CAD (coronary artery disease), native coronary artery 04/05/2015  . Aortic valve stenosis 04/05/2015  . Persistent atrial fibrillation ([place]) 09/19/2014  . Moderate persistent asthma without complication 70/62/3762  . Atrial fibrillation, persistent ([place]) 09/19/2014  . Angina pectoris ([place]) 09/18/2014  . Urinary  incontinence 03/04/2014  . Obesity (BMI 30-39.9)   . Long term (current) use of anticoagulants 10/06/2011  . Mixed hyperlipidemia 02/24/2010  . Gastro-esophageal reflux disease without esophagitis 09/24/2009    Palliative Care Assessment & Plan   Patient Profile: 87 year old gentleman with a history of prostate cancer status post prostatectomy 2016, history of atrial fibrillation recurrent DVT, history of PE history of aortic stenosis status post aortic valve replacement coronary artery disease hypertension has a pacemaker has stage III chronic kidney disease.  Patient presented with rectal pain found to have perirectal abscess.  Found to have renal abscess status post drain placement as well as has had acute blood loss anemia and melena in this hospitalization.  Recommendations/Plan:  DNR/DNI  Continue to treat treatable conditions.  [name] wife expresses that her husband has always expressed wanting to focus on quality of life rather than prolonging life at all cost.  She continues to see the changes in his overall nutrition, cognition, and functional status.  They are working together to try to determine how to best balance quality of life moving forward.  She does not believe he would want overly aggressive care if he develops terminal condition, however, his situation is complicated and he has potentially treatable conditions at this point.  We will continue to monitor and progress conversation based on his clinical course.  There is certainly open to all available help at home would recommend outpatient palliative care at discharge.  Code Status:    Code Status Orders  (From admission, onward)         Start     Ordered   05/24/20 1118  Do not attempt resuscitation (DNR)  Continuous       Question Answer Comment  In the event of cardiac or respiratory ARREST Do not call a "code blue"   In the event  of cardiac or respiratory ARREST Do not perform Intubation, CPR,  defibrillation or ACLS   In the event of cardiac or respiratory ARREST Use medication by any route, position, wound care, and other measures to relive pain and suffering. May use oxygen, suction and manual treatment of airway obstruction as needed for comfort.      05/24/20 1117        Code Status History    Date Active Date Inactive Code Status Order ID Comments User Context   05/15/2020 2341 05/24/2020 1117 Full Code [id number]  Orene Desanctis, DO ED   09/12/2019 1151 09/12/2019 1946 Full Code [id number]  Sherren Mocha, MD Inpatient   03/18/2018 1306 03/22/2018 1738 Full Code [id number]  Sherren Mocha, MD Inpatient   02/28/2018 1508 02/28/2018 2010 Full Code [id number]  Sherren Mocha, MD Inpatient   03/14/2017 1700 03/15/2017 1322 Full Code [id number]  Evans Lance, MD Inpatient   09/19/2014 0516 09/19/2014 1505 Full Code [id number]  Alfonso Ramus, MD Inpatient   10/09/2012 2042 10/11/2012 2228 Full Code [id number]  Gearlean Alf, MD Inpatient   09/28/2011 1943 10/04/2011 1600 Full Code [id number]  Jillyn Hidden, RN Inpatient   Advance Care Planning Activity    Advance Directive Documentation   Flowsheet Row Most Recent Value  Type of Advance Directive Healthcare Power of Attorney  Pre-existing out of facility DNR order (yellow form or pink MOST form) -  "MOST" Form in Place? -       Prognosis:   Unable to determine  Discharge Planning:  To Be Determined  Care plan was discussed with wife  Thank you for allowing the Palliative Medicine Team to assist in the care of this patient.   Total Time 25 Prolonged Time Billed No      Greater than 50%  of this time was spent counseling and coordinating care related to the above assessment and plan.  Micheline Rough, MD  Please contact Palliative Medicine Team phone at [phone] for questions and concerns.

## 2020-05-26 NOTE — Progress Notes (Signed)
Referring Physician(s): Dr. Wyline Copas, S.   Supervising Physician: Ruthann Cancer  Patient Status:  [facility] - In-pt  Chief Complaint:  S/p right renal subcapsular abscess drain placement on 05/18/20 with Dr. Laurence Ferrari   Subjective:  Patient laying in bed with his wife at the bedside.  Patient appears more alert today, able to follow command and carry conversations.  States that he wants to go home. Denise fever, chills, abdominal pain, nausea, vomiting.   Allergies: Darifenacin, Codeine, Sulfamethoxazole-trimethoprim, and Lisinopril  Medications: Prior to Admission medications   Medication Sig Start Date End Date Taking? Authorizing Provider  acetaminophen (TYLENOL) 500 MG tablet Take 500 mg by mouth at bedtime.    Yes [provider]  amLODipine (NORVASC) 2.5 MG tablet Take 1 tablet (2.5 mg total) by mouth daily. 09/03/19  Yes Tobb, Kardie, DO  Ascorbic Acid (VITAMIN C) 1000 MG tablet Take 1,000 mg by mouth daily.   Yes [provider]  Biotin 2500 MCG CAPS Take 2,500 mcg by mouth daily.   Yes [provider]  cholecalciferol (VITAMIN D) 1000 UNITS tablet Take 1,000 Units by mouth daily.   Yes [provider]  docusate sodium (COLACE) 50 MG capsule Take 50 mg by mouth daily.   Yes [provider]  furosemide (LASIX) 20 MG tablet Take 20 mg by mouth daily.   Yes [provider]  Glucosamine HCl (GLUCOSAMINE PO) Take 2,000 mg by mouth daily.   Yes [provider]  isosorbide mononitrate (IMDUR) 30 MG 24 hr tablet Take 0.5 tablets (15 mg total) by mouth daily. 02/05/20  Yes Richardo Priest, MD  Multiple Vitamin (MULTIVITAMIN) capsule Take 1 capsule by mouth daily.  04/28/19  Yes [provider]  nitroGLYCERIN (NITROSTAT) 0.4 MG SL tablet Place 1 tablet (0.4 mg total) under the tongue every 5 (five) minutes as needed. 07/14/19  Yes Richardo Priest, MD  Omega-3 Fatty Acids (FISH OIL) 1000 MG CAPS Take 1,000 mg by mouth  daily.   Yes [provider]  omeprazole (PRILOSEC) 20 MG capsule Take 1 capsule (20 mg total) by mouth daily. 04/21/20  Yes Richardo Priest, MD  pravastatin (PRAVACHOL) 20 MG tablet TAKE ONE TABLET BY MOUTH ONCE DAILY Patient taking differently: Take 20 mg by mouth daily. 01/08/20  Yes Richardo Priest, MD  vitamin B-12 (CYANOCOBALAMIN) 500 MCG tablet Take 500 mcg by mouth daily.   Yes [provider]  warfarin (COUMADIN) 2.5 MG tablet Take 1 tablet daily or as directed by [facility] Patient taking differently: Take 2.5 mg by mouth daily. 04/16/20  Yes Richardo Priest, MD  cefpodoxime (VANTIN) 200 MG tablet Take 1 tablet (200 mg total) by mouth 2 (two) times daily. Patient not taking: No sig reported 04/23/20   Molpus, Jenny Reichmann, MD     Vital Signs: BP 140/68 (BP Location: Right Arm)   Pulse (!) 59   Temp 97.9 F (36.6 C) (Oral)   Resp 20   Ht 6' (1.829 m)   Wt 214 lb (97.1 kg)   SpO2 96%   BMI 29.02 kg/m   Physical Exam Vitals and nursing note reviewed.  Constitutional:      General: He is not in acute distress. Pulmonary:     Effort: Pulmonary effort is normal.  Abdominal:     General: Bowel sounds are normal.     Palpations: Abdomen is soft.     Comments: Positive right flank drain to a suction bulb. Site is unremarkable with  no erythema, edema, tenderness, bleeding or drainage. Suture and stat lock in place. Dressing is clean, dry, and intact. 25 ml of purulent, yellow colored fluid noted in the suction bulb. Drain aspirates and flushes well.   Skin:    General: Skin is warm and dry.  Neurological:     Mental Status: He is alert.  Psychiatric:        Mood and Affect: Mood normal.        Behavior: Behavior normal.     Imaging: CT ABDOMEN PELVIS W CONTRAST  Result Date: 05/25/2020 CLINICAL DATA:  Enlarging right subcapsular perinephric fluid collection, status post drain catheter placement 05/18/2020 EXAM: CT ABDOMEN AND PELVIS WITH CONTRAST  TECHNIQUE: Multidetector CT imaging of the abdomen and pelvis was performed using the standard protocol following bolus administration of intravenous contrast. CONTRAST:  168mL OMNIPAQUE IOHEXOL 300 MG/ML  SOLN COMPARISON:  05/15/2020 and previous FINDINGS: Lower chest: Trace pericardial effusions as before. Transvenous pacing leads partially visualized. Status post AVR. Trace pericardial effusion. Patchy subsegmental atelectasis/consolidation posteriorly in the lung bases. Hepatobiliary: Liver unremarkable. Several subcentimeter partially calcified stones in the dependent aspect of the nondilated gallbladder. Pancreas: Unremarkable. No pancreatic ductal dilatation or surrounding inflammatory changes. Spleen: Normal in size without focal abnormality. Adrenals/Urinary Tract: Adrenal glands unremarkable. 1.4 cm mid left renal cyst. Urinary bladder nondistended. On the right, interval placement of percutaneous drain catheter with partial decompression of the perinephric collection, residual component 4.5 x 1.8 cm. Right nephrolithiasis, largest stone 1.9 cm centrally in the collecting system, nonobstructive. High attenuation material in the distended renal collecting system. Ureters decompressed. Enlarging 2 cm and 1.6 cm collections adjacent to the lower pole may represent developing small perinephric abscesses. Regional inflammatory changes persist. Stomach/Bowel: Stomach and small bowel are nondilated. The colon is decompressed, unremarkable. Vascular/Lymphatic: Heavy extensive aortic and iliofemoral calcified atheromatous plaque without aneurysm or evident high-grade stenosis. No abdominal or pelvic adenopathy. Infrarenal IVC filter without suggestion of caval thrombosis. Reproductive: Previous prostatectomy. Other: No ascites.  No free air. Musculoskeletal: Multilevel lumbar spondylitic change. Negative for fracture or worrisome bone lesion. For IMPRESSION: 1. Partial decompression of right perinephric collection  post percutaneous drain catheter placement. 2. Enlarging 2 cm and 1.6 cm lower pole collections may represent developing small perinephric abscesses. 3. Right nephrolithiasis with possible pyonephrosis 4. Cholelithiasis. Aortic Atherosclerosis (ICD10-I70.0). Electronically Signed   By: Lucrezia Europe M.D.   On: 05/25/2020 08:23    Labs:  CBC: Recent Labs    05/23/20 0711 05/24/20 0829 05/25/20 0512 05/26/20 0408  WBC 10.2 10.1 10.8* 9.7  HGB 10.8* 11.3* 11.0* 10.3*  HCT 34.0* 35.2* 34.5* 32.8*  PLT 351 319 355 322    COAGS: Recent Labs    04/22/20 2253 04/27/20 1102 05/23/20 0711 05/24/20 0829 05/25/20 0512 05/26/20 0408  INR 2.7*   < > 1.9* 2.4* 2.9* 3.0*  APTT 43*  --   --   --   --   --    < > = values in this interval not displayed.    BMP: Recent Labs    07/14/19 1343 08/24/19 2011 09/08/19 0920 04/22/20 2253 05/23/20 0711 05/24/20 0829 05/25/20 0512 05/26/20 0408  NA 143 139 141   < > 134* 137 137 135  K 4.4 3.9 4.1   < > 4.6 4.3 4.2 3.9  CL 109* 108 106   < > 102 102 104 102  CO2 18* 23 22   < > 24 27 26 26   GLUCOSE 131* 110* 107*   < >  122* 182* 113* 113*  BUN 30* 19 22   < > 21 20 15 18   CALCIUM 10.4* 9.6 10.0   < > 9.5 9.7 9.4 9.2  CREATININE 1.66* 1.43* 1.42*   < > 1.11 1.17 0.99 1.04  GFRNONAA 37* 44* 44*   < > >60 >60 >60 >60  GFRAA 43* 51* 51*  --   --   --   --   --    < > = values in this interval not displayed.    LIVER FUNCTION TESTS: Recent Labs    04/27/20 1102 05/17/20 0659 05/18/20 0449 05/19/20 0515  BILITOT 1.3* 0.9 0.8 1.1  AST 54* 16 20 16   ALT 19 9 9 8   ALKPHOS 103 71 69 68  PROT 7.5 5.8* 5.8* 6.0*  ALBUMIN 3.1* 2.3* 2.2* 2.4*    Assessment and Plan:  S/p right renal subcapsular abscess drain placement on 05/18/20 with Dr. Laurence Ferrari   Patient remains stable. WBC normal, afebrile.  Overnight output 45 cc.  Drain and dressing are clean, dry, intact. Drain aspirates and flushes well.  Patient underwent follow up CT AP  with IV contrast on 4/18, the scan was reviewed by Dr. Annamaria Boots on 4/19 who stated that no further intervention is needed at the time.   Continue with flushing TID, output recording q shift and dressing changes as needed. Would consider additional imaging when output is less than 10 ml for 24 hours not including flush material.   If patient were to be disharged with the drain in place, our clinic will call patient to set up a follow up appointment for CT and possible drain injection at the clinic once the output is less than 10 ml for 24 hours. Out patient follow up orders in.   Further treatment plan per TR[facility]/GI/Palliative/urology  Appreciate and agree with the plan.  IR to follow.   Electronically Signed: Tera Mater, PA-C 05/26/2020, 10:24 AM   I spent a total of 15 Minutes at the the patient's bedside AND on the patient's hospital floor or unit, greater than 50% of which was counseling/coordinating care for right renal subcapsular abscess drain

## 2020-05-26 NOTE — Care Management Important Message (Signed)
Important Message  Patient Details IM Letter given to the Patient. Name: [name] MRN: [id number] Date of Birth: 1931/08/06   Medicare Important Message Given:  Yes     Kerin Salen 05/26/2020, 10:54 AM

## 2020-05-26 NOTE — Progress Notes (Signed)
Daily Progress Note   Patient Name: [name]       Date: 05/26/2020 DOB: May 01, 1931  Age: 87 y.o. MRN#: [id number] Attending Physician: Kerney Elbe, DO Primary Care Physician: Shirline Frees, MD Admit Date: 05/15/2020  Reason for Consultation/Follow-up: Establishing goals of care  Subjective: I saw and examined [name] today.  He is much more awake and interactive.  His wife is at the bedside as well.  He reports looking forward to being able to go home and that he is encouraged as steps are being taken to work to get him set up for discharge.  Discussed outpatient palliative care and recommendation for f/u by outpatient palliative care in a couple of weeks.  Length of Stay: 10  Current Medications: Scheduled Meds:  . furosemide  20 mg Oral Daily  . isosorbide mononitrate  15 mg Oral Daily  . pantoprazole  40 mg Oral Q0600  . polyethylene glycol  34 g Oral BID  . pravastatin  20 mg Oral Daily  . sodium chloride flush  10-40 mL Intracatheter Q12H  . sodium chloride flush  5 mL Intracatheter Q8H  . vitamin B-12  500 mcg Oral Daily  . Warfarin - Pharmacist Dosing Inpatient   Does not apply q1600    Continuous Infusions: . sodium chloride 10 mL/hr at 05/22/20 1840  . [START ON 05/27/2020] ertapenem      PRN Meds: acetaminophen, haloperidol lactate, HYDROcodone-acetaminophen, QUEtiapine, sodium chloride flush  Physical Exam         Awake and alert.  Lying in bed in no distress Regular work of breathing Chronically ill and weak appearing No significant edema Drain noted to be in place  Vital Signs: BP (!) 120/54 (BP Location: Right Arm)   Pulse 67   Temp 97.7 F (36.5 C) (Oral)   Resp 16   Ht 6' (1.829 m)   Wt 97.1 kg   SpO2 98%   BMI 29.02 kg/m   SpO2: SpO2: 98 % O2 Device: O2 Device: Room Air O2 Flow Rate: O2 Flow Rate (L/min): 4 L/min  Intake/output summary:   Intake/Output Summary (Last 24 hours) at 05/26/2020 2038 Last data filed at 05/26/2020 1253 Gross per 24 hour  Intake 200 ml  Output 1145 ml  Net -945 ml   LBM: Last BM Date: 05/25/20  Baseline Weight: Weight: 97.1 kg Most recent weight: Weight: 97.1 kg       Palliative Assessment/Data:      Patient Active Problem List   Diagnosis Date Noted  . Renal abscess   . Perirectal abscess 05/15/2020  . Pyelonephritis of right kidney 05/15/2020  . Presence of IVC filter 05/15/2020  . Constipation, chronic 05/15/2020  . Cysts of right kidney 05/15/2020  . Severe aortic stenosis   . OSA on CPAP   . History of prostate cancer   . History of peptic ulcer   . History of kidney stones   . GERD   . CAD (coronary artery disease), native coronary artery   . Arthritis   . Bulbous urethral stricture 12/03/2019  . Internal hemorrhoids 09/11/2018  . Noise effect on both inner ears 08/14/2018  . Presbycusis of both ears 08/14/2018  . 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 CKD (chronic kidney disease), stage III ([place])   . Chronic combined systolic and diastolic heart failure ([place]) 11/19/2017  . Cardiac pacemaker in situ 03/14/2017  . 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) 01/24/2016  . OSA treated with BiPAP 11/15/2015  . Personal history of malignant neoplasm of prostate 04/05/2015  . Hypertensive heart disease 04/05/2015  . 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[place]) 04/05/2015  . CAD (coronary artery disease), native coronary artery 04/05/2015  . Aortic valve stenosis 04/05/2015  . Persistent atrial fibrillation ([place]) 09/19/2014  . Moderate persistent asthma without complication 84/69/6295  . Atrial fibrillation, persistent ([place]) 09/19/2014  . Angina pectoris ([place]) 09/18/2014  . Urinary incontinence 03/04/2014  .  Obesity (BMI 30-39.9)   . Long term (current) use of anticoagulants 10/06/2011  . Mixed hyperlipidemia 02/24/2010  . Gastro-esophageal reflux disease without esophagitis 09/24/2009    Palliative Care Assessment & Plan   Patient Profile: 87 year old gentleman with a history of prostate cancer status post prostatectomy 2016, history of atrial fibrillation recurrent DVT, history of PE history of aortic stenosis status post aortic valve replacement coronary artery disease hypertension has a pacemaker has stage III chronic kidney disease.  Patient presented with rectal pain found to have perirectal abscess.  Found to have renal abscess status post drain placement as well as has had acute blood loss anemia and melena in this hospitalization.  Recommendations/Plan:  DNR/DNI  Continue to treat treatable conditions.  [name] is looking forward to discharge soon.  Recommend OP palliative care to follow at discharge  Code Status:    Code Status Orders  (From admission, onward)         Start     Ordered   05/24/20 1118  Do not attempt resuscitation (DNR)  Continuous       Question Answer Comment  In the event of cardiac or respiratory ARREST Do not call a "code blue"   In the event of cardiac or respiratory ARREST Do not perform Intubation, CPR, defibrillation or ACLS   In the event of cardiac or respiratory ARREST Use medication by any route, position, wound care, and other measures to relive pain and suffering. May use oxygen, suction and manual treatment of airway obstruction as needed for comfort.      05/24/20 1117        Code Status History    Date Active Date Inactive Code Status Order ID Comments User Context   05/15/2020 2341 05/24/2020 1117 Full Code [id number]  Orene Desanctis, DO ED   09/12/2019 1151 09/12/2019 1946 Full Code  [id number]  Sherren Mocha, MD Inpatient   03/18/2018 1306 03/22/2018 1738 Full Code [id number]  Sherren Mocha, MD Inpatient   02/28/2018 1508 02/28/2018 2010  Full Code [id number]  Sherren Mocha, MD Inpatient   03/14/2017 1700 03/15/2017 1322 Full Code [id number]  Evans Lance, MD Inpatient   09/19/2014 0516 09/19/2014 1505 Full Code [id number]  Alfonso Ramus, MD Inpatient   10/09/2012 2042 10/11/2012 2228 Full Code [id number]  Gearlean Alf, MD Inpatient   09/28/2011 1943 10/04/2011 1600 Full Code [id number]  Jillyn Hidden, RN Inpatient   Advance Care Planning Activity    Advance Directive Documentation   Flowsheet Row Most Recent Value  Type of Advance Directive Healthcare Power of Attorney  Pre-existing out of facility DNR order (yellow form or pink MOST form) --  "MOST" Form in Place? --       Prognosis:   Unable to determine  Discharge Planning:  To Be Determined  Care plan was discussed with patient and wife  Thank you for allowing the Palliative Medicine Team to assist in the care of this patient.   Total Time 25 Prolonged Time Billed No  Greater than 50%  of this time was spent counseling and coordinating care related to the above assessment and plan.  Micheline Rough, MD  Please contact Palliative Medicine Team phone at [phone] for questions and concerns.

## 2020-05-27 ENCOUNTER — Inpatient Hospital Stay (HOSPITAL_COMMUNITY): Payer: Medicare Other

## 2020-05-27 LAB — CBC WITH DIFFERENTIAL/PLATELET
Abs Immature Granulocytes: 0.05 10*3/uL (ref 0.00–0.07)
Basophils Absolute: 0.1 10*3/uL (ref 0.0–0.1)
Basophils Relative: 1 %
Eosinophils Absolute: 0.2 10*3/uL (ref 0.0–0.5)
Eosinophils Relative: 2 %
HCT: 36.8 % — ABNORMAL LOW (ref 39.0–52.0)
Hemoglobin: 11.6 g/dL — ABNORMAL LOW (ref 13.0–17.0)
Immature Granulocytes: 1 %
Lymphocytes Relative: 14 %
Lymphs Abs: 1.4 10*3/uL (ref 0.7–4.0)
MCH: 25.6 pg — ABNORMAL LOW (ref 26.0–34.0)
MCHC: 31.5 g/dL (ref 30.0–36.0)
MCV: 81.1 fL (ref 80.0–100.0)
Monocytes Absolute: 1 10*3/uL (ref 0.1–1.0)
Monocytes Relative: 9 %
Neutro Abs: 8 10*3/uL — ABNORMAL HIGH (ref 1.7–7.7)
Neutrophils Relative %: 73 %
Platelets: 348 10*3/uL (ref 150–400)
RBC: 4.54 MIL/uL (ref 4.22–5.81)
RDW: 18.6 % — ABNORMAL HIGH (ref 11.5–15.5)
WBC: 10.7 10*3/uL — ABNORMAL HIGH (ref 4.0–10.5)
nRBC: 0 % (ref 0.0–0.2)

## 2020-05-27 LAB — COMPREHENSIVE METABOLIC PANEL
ALT: 8 U/L (ref 0–44)
AST: 21 U/L (ref 15–41)
Albumin: 2.5 g/dL — ABNORMAL LOW (ref 3.5–5.0)
Alkaline Phosphatase: 77 U/L (ref 38–126)
Anion gap: 6 (ref 5–15)
BUN: 18 mg/dL (ref 8–23)
CO2: 27 mmol/L (ref 22–32)
Calcium: 9.5 mg/dL (ref 8.9–10.3)
Chloride: 101 mmol/L (ref 98–111)
Creatinine, Ser: 1.18 mg/dL (ref 0.61–1.24)
GFR, Estimated: 60 mL/min — ABNORMAL LOW (ref >60)
Glucose, Bld: 124 mg/dL — ABNORMAL HIGH (ref 70–99)
Potassium: 4.2 mmol/L (ref 3.5–5.1)
Sodium: 134 mmol/L — ABNORMAL LOW (ref 135–145)
Total Bilirubin: 1 mg/dL (ref 0.3–1.2)
Total Protein: 6.7 g/dL (ref 6.5–8.1)

## 2020-05-27 LAB — MAGNESIUM: Magnesium: 2 mg/dL (ref 1.7–2.4)

## 2020-05-27 LAB — PHOSPHORUS: Phosphorus: 2.7 mg/dL (ref 2.5–4.6)

## 2020-05-27 LAB — PROTIME-INR
INR: 2.5 — ABNORMAL HIGH (ref 0.8–1.2)
Prothrombin Time: 26.9 seconds — ABNORMAL HIGH (ref 11.4–15.2)

## 2020-05-27 MED ORDER — ERTAPENEM IV (FOR PTA / DISCHARGE USE ONLY): 1.0000 g | INTRAVENOUS | 0 refills | Status: AC

## 2020-05-27 MED ORDER — WARFARIN SODIUM 2.5 MG PO TABS
2.5000 mg | ORAL_TABLET | Freq: Once | ORAL | Status: AC
  Administered 2020-05-27: 2.5 mg via ORAL
  Filled 2020-05-27: qty 1

## 2020-05-27 MED ORDER — CHLORHEXIDINE GLUCONATE CLOTH 2 % EX PADS
6.0000 | MEDICATED_PAD | Freq: Every day | CUTANEOUS | Status: DC
  Administered 2020-05-28: 6 via TOPICAL

## 2020-05-27 NOTE — Progress Notes (Signed)
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  Subsequently was also found to have a 6 g drop in his hemoglobin with heme positive stools on presentation.  Further work-up also revealed the patient had a renal abscess status post drain placement with cultures growing out ESBL E. coli; patient underwent perirectal abscess I&D on 05/16/2020.  Further work-up showed that he had a right renal subscapular fluid collection and renal abscess.  ID was consulted and recommending IV meropenem given that the drain placement was positive for ESBL E. coli.  On IV meropenem today and transitioning to ertapenem 1 g daily tomorrow anticipation for discharge and recommending PICC line placement. Palliative Care met with the patient and recommending DNR/DNI but patient will still focus on quality of life and we will continue to treat the treatable conditions.  Patient had a PICC line placed today and he was scheduled be discharged but he was extremely groggy overnight and little confused so we will observe another night.  He was complaining about some left ankle pain and thinks he may twisted so obtain x-ray as well.  Assessment & Plan:   Principal Problem:   Perirectal abscess Active Problems:   Hypertensive heart disease   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)   Gastro-esophageal reflux disease  without esophagitis   Long term (current) use of anticoagulants   Persistent atrial fibrillation (HCC)   CKD (chronic kidney disease), stage III (HCC)   S/P TAVR (transcatheter aortic valve replacement)   OSA on CPAP   CAD (coronary artery disease), native coronary artery   Atrial fibrillation, persistent (HCC)   Pyelonephritis of right kidney   Presence of IVC filter   Constipation, chronic   Cysts of right kidney   Renal abscess  Perirectal Abscess secondary to ESBL E. coli status post I&D 4/10 -Noted on CT abdomen and pelvis. -General surgery consulted and patient underwent incision and drainage (4/10) per general surgery. -Cultures from incision and drainage with abundant ESBL E. coli sensitive to Zosyn, Cipro, gentamicin, imipenem, Bactrim and resistant to Unasyn, ampicillin, cefazolin, ceftazidime, Rocephin with intermediate sensitivity to cefepime. -Due to concurrent renal abscess, ID consulted and IV antibiotics changed to IV Merrem and will change to IV Ertapenem today as PICC is being placed. Patient was a little more confused and drowsy today so will hold D/C and observe overnight  -Continue sitz bath's as recommended per general surgery.   -General surgery was following but have signed off.   -Outpatient follow-up with general surgery when Discharged  Right renal subcapsular fluid collection/renal abscess/ Persistent pyelonephritis/nonobstructing nephrolithiasis. -Patient noted to have a right renal subcapsular fluid collection/renal abscess, -Patient noted to have had a UTI approximately a month ago placed on Bactrim later reevaluated in the ED placed on cefpodoxime.  Urine cultures with no growth. -Cultures from this admission negative. -Unable to perform MRI secondary to pacemaker. -Dr Wyline Copas discussed with urology who recommended drainage per IR. -  Status post drain placement per IR with purulent fluid noted and cultures positive for ESBL E. coli.   -Per IR once output  from drainage < 10cc, will need repeat CT scan. -Repeat CT abdomen and pelvis ordered and done (05/24/2020) with partial decompression of right perinephric collection post percutaneous drain catheter placement, enlarging 2 cm 1.6 cm lower pole collections may represent developing small perinephric abscesses, right nephrolithiasis with possible pyelonephrosis, cholelithiasis.  -There was concern on 05/22/2020, about drain insertion site concerning for infection however assessed insertion site of drain myself which is nonerythematous, no warmth, no significant tenderness to palpation. -IR recommended urology consultation/input during this hospitalization and as such urology saw patient in consultation and recommended continue drain per IR, antibiotic recommendations per ID, no other intervention necessary at this time, outpatient follow-up with primary urologist, Dr. Burnell Blanks post discharge. -Continue IV Merrem for ESBL per ID recommendations but will be changed to IV Ertapenem in the AM with PICC line placed in anticipation of D/C; Will hold D/C today given his lethargy and re-evaluate and D/C in the AM  -Will await ID and IR recommendations pending the evaluation of repeat CT. The CT scan on 4/19 and Dr. Annamaria Boots recommended no further intervention needed at this time -Interventional Radiology recommending continuing drain flushing 3 times daily and output recording every shift and dressing changes as needed and they will consider additional imaging when output less than 10 mL for 24 hours and if patient is to be discharged with a drain placed he will have a drain clinic appointment -ID following and appreciate input and recommendations.  Patient to be changed to ertapenem tomorrow to 1 g daily in anticipation for discharge and infectious diseases are opting for IV therapy in the setting of undrained collections and history of Bactrim allergy; they are recommending a plan for 3 weeks more therapy but final  duration to be determined on drain management -WBC has been fluctuating and ranging  -OPAT orders have been placed  Normocytic Anemia with Associated melena -Concern for acute GI bleed in the setting of Coumadin use. -Patient noted to be followed closely at [facility] for GI for history of hemorrhoids in the past. -Hemoglobin noted to have gone down to as low as 9.7 from 16.2 (04/27/2020). -Hemoglobin/Hct stable at 10.3/32.8 yesterday and today it is 1.6/36.8 -Patient with no overt bleeding. -Stools noted to be heme positive. -Dr. Wyline Copas discussed with GI on-call, and given recent surgical drainage of perirectal abscess recommendation is to hold off on inpatient endoscopy for now and follow-up in the outpatient setting when more stable. -Coumadin has been resumed and INR is therapeutic at 3.0  History of persistent atrial fibrillation -CHA2DSVASC score 5 -Coumadin was held initially during the hospitalization, due to concerns for anemia/melena.   -Coumadin resumed.   -INR at 2.5 today .   -Hemoglobin stable at 11.6  -Coumadin per pharmacy dosing  Acquired Thrombophilia -In the setting of A Fib and recurrent DVT/PE  History of recurrent DVT/PE with supratherapeutic INR -Patient noted to have a history of provoked DVT following prostatectomy status post IVC placement afterwards. -Patient noted to have developed bacteremia and PE and DVT and subsequently placed on warfarin. -Patient's PE and DVT noted to be remote. -Coumadin had held initially due to concern for dark stools .   -Coumadin resumed.   -INR at 2.5 today .   -Hemoglobin stable at 11.6  -Coumadin per pharmacy.  Patient to receive 2-1/2 mg of Coumadin tonight  History of heart block  status post PPM -Continue Outpatient follow-up with cardiology.    History of aortic stenosis status post TAVR -Appears to be a bioprosthetic valve. -INR as above at 2.5 -Outpatient follow-up.  Coronary artery disease status post  stent -Initially Lasix and Norvasc held due to borderline blood pressure.   -Continued Imdur, pravastatin 20 mg p.o. daily -Resumed Furosemide 20 mg po Daily and will continue at discharge    OSA -Patient noted to be refusing CPAP at bedtime and has been changed to as needed.  Chronic kidney disease stage IIIa -Patient's BUNs/creatinine has been relatively stable and actually improved from earlier on in the admission next-patient's BUN/creatinine went from 20/1.38 on 05/20/2020 and yesterday is 18/1.04 and today it is 18/1.18 -Avoid further nephrotoxic medications, contrast dyes, hypotension and renally dose medications -Repeat CMP in a.m.  Borderline blood pressure -Patient noted to have borderline blood pressure 05/21/2020.   -Continued Imdur.  Resume home regimen Lasix. -Continue to hold Norvasc. -Continue to monitor blood pressures per protocol -Last blood pressure reading was  137/61  Constipation -Resolved on current bowel regimen.   -Had a bowel movement the day before yesterday but not yesterday and thinks he will have a bowel movement today -Polyethylene glycol 34 g p.o. twice daily  Left ankle pain -Unclear if he has had any trauma but he thinks he may have twisted it -Obtain x-ray of the left ankle -Continue OT to evaluate and treat  Nonobstructing nephrolithiasis -Urology evaluated with no further inpatient recommendations and advised follow-up with primary urologist  GERD  -Continue with PPI with pantoprazole 40 g p.o. daily  Overweight -Estimated body mass index is 29.02 kg/m as calculated from the following:   Height as of this encounter: 6' (1.829 m).   Weight as of this encounter: 97.1 kg.   DVT prophylaxis: Anticoagulated with Coumadin Code Status: DO NOT RESUSCITATE Family Communication: Spoke with wife [name] at bedside again Disposition Plan: Pending ID Clearance as well as IR clearance; PT recommending Home Health PT with 24 Hour Supervision and  anticipating discharging home tomorrow if his mentation is appropriate  Status is: Inpatient  Remains inpatient appropriate because:Unsafe d/c plan, IV treatments appropriate due to intensity of illness or inability to take PO and Inpatient level of care appropriate due to severity of illness   Dispo: The patient is from: Home              Anticipated d/c is to: Home              Patient currently is not medically stable to d/c.   Difficult to place patient No  Consultants:   Infectious Diseases  Interventional Radiology   Palliative Care Medicine   Urology  General Surgery  Case was discussed with gastroenterology  Procedures:   CT abdomen and pelvis 05/15/2020  Placement of 12 French percutaneous drainage catheter with aspiration of 135 mL of frankly pleural fluid from subscapular perinephric fluid collection-Per IR, Dr. Laurence Ferrari 05/18/2020  Incision and drainage of perirectal abscess per general surgery, Dr. Donne Hazel 05/16/2020  Antimicrobials:  Anti-infectives (From admission, onward)   Start     Dose/Rate Route Frequency Ordered Stop   05/27/20 1000  ertapenem (INVANZ) 1,000 mg in sodium chloride 0.9 % 100 mL IVPB        1 g 200 mL/hr over 30 Minutes Intravenous Every 24 hours 05/26/20 1057     05/27/20 0000  ertapenem (INVANZ) IVPB        1 g Intravenous Every 24 hours  05/27/20 1406 06/18/20 2359   05/22/20 1800  meropenem (MERREM) 1 g in sodium chloride 0.9 % 100 mL IVPB        1 g 200 mL/hr over 30 Minutes Intravenous Every 8 hours 05/22/20 1000 05/26/20 1900   05/19/20 2200  meropenem (MERREM) 1 g in sodium chloride 0.9 % 100 mL IVPB  Status:  Discontinued        1 g 200 mL/hr over 30 Minutes Intravenous Every 12 hours 05/19/20 1406 05/22/20 1000   05/16/20 1800  piperacillin-tazobactam (ZOSYN) IVPB 3.375 g  Status:  Discontinued        3.375 g 12.5 mL/hr over 240 Minutes Intravenous Every 8 hours 05/16/20 1335 05/19/20 1406   05/16/20 0800   piperacillin-tazobactam (ZOSYN) IVPB 3.375 g  Status:  Discontinued        3.375 g 12.5 mL/hr over 240 Minutes Intravenous Every 8 hours 05/16/20 0059 05/16/20 1304   05/15/20 2315  piperacillin-tazobactam (ZOSYN) IVPB 3.375 g        3.375 g 100 mL/hr over 30 Minutes Intravenous  Once 05/15/20 2311 05/16/20 0136        Subjective: Seen and examined at bedside and he was more lethargic and drowsy today and a little bit confused.  Denied any nausea or vomiting but was complaining of some left ankle pain.  Does not know what happened to her and denies any falls.  Is very groggy throughout the day and was given Seroquel and Vicodin at the same time last night so we will need to space it out tonight.  If his mentation is appropriate tomorrow and labs are normal likely can be discharged home in a.m.  Objective: Vitals:   05/26/20 2110 05/27/20 0602 05/27/20 0831 05/27/20 1416  BP: 128/64 (!) 174/70 (!) 115/52 137/61  Pulse: 70 68 66 66  Resp: 18 20    Temp: 98.9 F (37.2 C) 98 F (36.7 C) 98.4 F (36.9 C) 100 F (37.8 C)  TempSrc:   Axillary Axillary  SpO2: 97% 96% 95% 96%  Weight:      Height:        Intake/Output Summary (Last 24 hours) at 05/27/2020 1717 Last data filed at 05/27/2020 0027 Gross per 24 hour  Intake --  Output 180 ml  Net -180 ml   Filed Weights   05/15/20 1923  Weight: 97.1 kg   Examination: Physical Exam:  Constitutional: WN/WD overweight Caucasian male who is currently working with therapy and is lethargic and fatigued and a little confused Eyes: Lids and conjunctivae normal, sclerae anicteric  ENMT: External Ears, Nose appear normal. Grossly normal hearing. Neck: Appears normal, supple, no cervical masses, normal ROM, no appreciable thyromegaly; no JVD Respiratory: Diminished to auscultation bilaterally with coarse breath sounds, no wheezing, rales, rhonchi or crackles. Normal respiratory effort and patient is not tachypenic. No accessory muscle use.   Unlabored breathing Cardiovascular: RRR, no murmurs / rubs / gallops. S1 and S2 auscultated.  Has some pedal edema worse on the left compared to the right and some trace lower extremity edema Abdomen: Soft, non-tender, distended secondary body habitus. Bowel sounds positive.  GU: Deferred. Musculoskeletal: No clubbing / cyanosis of digits/nails. No joint deformity upper and lower extremities.  Has a drain in place Skin: No rashes, lesions or limited skin evaluation, ulcers. No induration; Warm and dry.  Neurologic: CN 2-12 grossly intact with no focal deficits. Romberg sign and cerebellar reflexes not assessed.  Psychiatric: Normal judgment and insight.  A little somnolent  and drowsy and fatigued appearing.  Remains a little confused as well. Normal mood and appropriate affect.   Data Reviewed: I have personally reviewed following labs and imaging studies  CBC: Recent Labs  Lab 05/23/20 0711 05/24/20 0829 05/25/20 0512 05/26/20 0408 05/27/20 0348  WBC 10.2 10.1 10.8* 9.7 10.7*  NEUTROABS  --  7.9* 8.1* 7.0 8.0*  HGB 10.8* 11.3* 11.0* 10.3* 11.6*  HCT 34.0* 35.2* 34.5* 32.8* 36.8*  MCV 80.2 80.5 80.4 80.0 81.1  PLT 351 319 355 322 100   Basic Metabolic Panel: Recent Labs  Lab 05/21/20 0517 05/22/20 0512 05/23/20 0711 05/24/20 0829 05/25/20 0512 05/26/20 0408 05/27/20 0348  NA 137   < > 134* 137 137 135 134*  K 4.2   < > 4.6 4.3 4.2 3.9 4.2  CL 106   < > 102 102 104 102 101  CO2 25   < > _0 GLUCOSE 119*   < > 122* 182* 113* 113* 124*  BUN 22   < > _1 CREATININE 1.38*   < > 1.11 1.17 0.99 1.04 1.18  CALCIUM 9.2   < > 9.5 9.7 9.4 9.2 9.5  MG 2.0  --   --   --   --   --  2.0  PHOS  --   --   --   --   --   --  2.7   < > = values in this interval not displayed.   GFR: Estimated Creatinine Clearance: 53.3 mL/min (by C-G formula based on SCr of 1.18 mg/dL). Liver Function Tests: Recent Labs  Lab 05/27/20 0348  AST 21  ALT 8  ALKPHOS 77   BILITOT 1.0  PROT 6.7  ALBUMIN 2.5*   No results for input(s): LIPASE, AMYLASE in the last 168 hours. Recent Labs  Lab 05/23/20 0711  AMMONIA 18   Coagulation Profile: Recent Labs  Lab 05/23/20 0711 05/24/20 0829 05/25/20 0512 05/26/20 0408 05/27/20 0348  INR 1.9* 2.4* 2.9* 3.0* 2.5*   Cardiac Enzymes: No results for input(s): CKTOTAL, CKMB, CKMBINDEX, TROPONINI in the last 168 hours. BNP (last 3 results) No results for input(s): PROBNP in the last 8760 hours. HbA1C: No results for input(s): HGBA1C in the last 72 hours. CBG: No results for input(s): GLUCAP in the last 168 hours. Lipid Profile: No results for input(s): CHOL, HDL, LDLCALC, TRIG, CHOLHDL, LDLDIRECT in the last 72 hours. Thyroid Function Tests: No results for input(s): TSH, T4TOTAL, FREET4, T3FREE, THYROIDAB in the last 72 hours. Anemia Panel: No results for input(s): VITAMINB12, FOLATE, FERRITIN, TIBC, IRON, RETICCTPCT in the last 72 hours. Sepsis Labs: No results for input(s): PROCALCITON, LATICACIDVEN in the last 168 hours.  Recent Results (from the past 240 hour(s))  Aerobic/Anaerobic Culture (surgical/deep wound)     Status: None   Collection Time: 05/18/20 12:01 PM   Specimen: Abscess  Result Value Ref Range Status   Specimen Description   Final    ABSCESS RT KIDNEY Performed at [facility] [address] [place], [place] [address]    Special Requests   Final    Normal Performed at [facility], [place] [address] [place], [place] [address]    Gram Stain   Final    ABUNDANT WBC PRESENT,BOTH PMN AND MONONUCLEAR FEW GRAM NEGATIVE RODS    Culture   Final    ABUNDANT ESCHERICHIA COLI Confirmed Extended Spectrum Beta-Lactamase Producer (ESBL).  In bloodstream infections  from ESBL organisms, carbapenems are preferred over piperacillin/tazobactam. They are shown to have a lower risk of mortality. NO ANAEROBES ISOLATED Performed at [facility],  [place] [address], [place], [place] [address]    Report Status 05/23/2020 FINAL  Final   Organism ID, Bacteria ESCHERICHIA COLI  Final      Susceptibility   Escherichia coli - MIC*    AMPICILLIN >=32 RESISTANT Resistant     CEFAZOLIN >=64 RESISTANT Resistant     CEFEPIME 8 INTERMEDIATE Intermediate     CEFTAZIDIME RESISTANT Resistant     CEFTRIAXONE >=64 RESISTANT Resistant     CIPROFLOXACIN <=0.25 SENSITIVE Sensitive     GENTAMICIN <=1 SENSITIVE Sensitive     IMIPENEM <=0.25 SENSITIVE Sensitive     TRIMETH/SULFA <=20 SENSITIVE Sensitive     AMPICILLIN/SULBACTAM >=32 RESISTANT Resistant     PIP/TAZO 8 SENSITIVE Sensitive     * ABUNDANT ESCHERICHIA COLI     RN Pressure Injury Documentation:     Estimated body mass index is 29.02 kg/m as calculated from the following:   Height as of this encounter: 6' (1.829 m).   Weight as of this encounter: 97.1 kg.  Malnutrition Type:   Malnutrition Characteristics:   Nutrition Interventions:     Radiology Studies: DG Ankle Complete Left  Result Date: 05/27/2020 CLINICAL DATA:  Left ankle pain. EXAM: LEFT ANKLE COMPLETE - 3+ VIEW COMPARISON:  No recent. FINDINGS: Diffuse osteopenia. Diffuse degenerative change. Small bony densities noted adjacent to the medial and lateral malleoli as well as over the dorsal talus. These may represent small fracture fragments, possibly old. Surgical screw noted of foot. Peripheral vascular calcification. IMPRESSION: 1. Diffuse osteopenia and degenerative change. Small bony densities noted adjacent to the medial and lateral malleoli as well as over the dorsal talus. These may represent small fracture fragments, possibly old. 2.  Peripheral vascular disease. Electronically Signed   By: Marcello Moores  Register   On: 05/27/2020 12:56   DG CHEST PORT 1 VIEW  Result Date: 05/27/2020 CLINICAL DATA:  PICC line placement. EXAM: PORTABLE CHEST 1 VIEW COMPARISON:  04/22/2020. FINDINGS: PICC line noted with tip over SVC. AICD noted  stable position. Prior cardiac valve replacement. Stable cardiomegaly. No pulmonary venous congestion. Mild right infrahilar atelectasis/infiltrate cannot be excluded. Stable left base pleural thickening consistent scarring. No pleural effusion. No pneumothorax. Prominent degenerative changes both shoulders. IMPRESSION: 1.  Right PICC line noted with tip over SVC. 2. AICD in stable position. Prior cardiac valve replacement. Stable cardiomegaly. No pulmonary venous congestion. 3.  Mild right infrahilar atelectasis/infiltrate cannot be excluded. Electronically Signed   By: Marcello Moores  Register   On: 05/27/2020 11:27   [place] EKG SITE RITE  Result Date: 05/26/2020 If Site Rite image not attached, placement could not be confirmed due to current cardiac rhythm.  Scheduled Meds: . Chlorhexidine Gluconate Cloth  6 each Topical Daily  . furosemide  20 mg Oral Daily  . isosorbide mononitrate  15 mg Oral Daily  . pantoprazole  40 mg Oral Q0600  . polyethylene glycol  34 g Oral BID  . pravastatin  20 mg Oral Daily  . sodium chloride flush  10-40 mL Intracatheter Q12H  . sodium chloride flush  5 mL Intracatheter Q8H  . vitamin B-12  500 mcg Oral Daily  . Warfarin - Pharmacist Dosing Inpatient   Does not apply q1600   Continuous Infusions: . sodium chloride 10 mL/hr at 05/22/20 1840  . ertapenem 1,000 mg (05/27/20 1117)    LOS:  11 days   Kerney Elbe, DO [facility] PAGER is on [place]  If 7PM-7AM, please contact night-coverage [email or web address]

## 2020-05-27 NOTE — TOC Benefit Eligibility Note (Signed)
Transition of Care (TOC) Benefit Eligibility Note    Patient Details  Name: [name] MRN: [id number] Date of Birth: 04/01/1931   Medication/Dose: Ertapenam IV abx 1000mg qd  Covered?: Yes  Tier: 3 Drug  Prescription Coverage Preferred Pharmacy: local  Spoke with Person/Company/Phone Number:: Samantha/ [employer] [employer] [phone]  Co-Pay: $25.00  Prior Approval: No  Deductible: Met       ,  Phone Number: 05/27/2020, 10:23 AM     

## 2020-05-27 NOTE — Progress Notes (Signed)
Peripherally Inserted Central Catheter Placement  The IV Nurse has discussed with the patient and/or persons authorized to consent for the patient, the purpose of this procedure and the potential benefits and risks involved with this procedure.  The benefits include less needle sticks, lab draws from the catheter, and the patient may be discharged home with the catheter. Risks include, but not limited to, infection, bleeding, blood clot (thrombus formation), and puncture of an artery; nerve damage and irregular heartbeat and possibility to perform a PICC exchange if needed/ordered by physician.  Alternatives to this procedure were also discussed.  Bard Power PICC patient education guide, fact sheet on infection prevention and patient information card has been provided to patient /or left at bedside.    PICC Placement Documentation  PICC Single Lumen 55/97/41 Right Basilic 39 cm (Active)  Indication for Insertion or Continuance of Line Home intravenous therapies (PICC only) 05/27/20 1000  Exposed Catheter (cm) 0 cm 05/27/20 1000  Site Assessment Clean;Dry;Intact 05/27/20 1000  Line Status Flushed;Blood return noted 05/27/20 1000  Dressing Type Transparent 05/27/20 1000  Dressing Status Clean;Dry;Intact 05/27/20 1000  Antimicrobial disc in place? Yes 05/27/20 1000  Dressing Change Due 06/03/20 05/27/20 1000       Jule Economy Horton 05/27/2020, 10:46 AM

## 2020-05-27 NOTE — Progress Notes (Signed)
Occupational Therapy Treatment Patient Details Name: [name] MRN: [id number] DOB: [birth year] Today's Date: 05/27/2020    History of present illness 87 y.o. male admitted on 05/15/20 who presents with concerns of rectal pain, found to have perirectal abscess, s/p I &D 05/16/20 and s/p 35F drain R renal subcapsular fluid collection 05/18/20.Marland Kitchen Also found to have 6g drop in hgb with heme pos stools, suspect GIB. 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   OT comments  Spouse reports patient had rough night trying to sleep last night and was given sedative impacting patient cognition/arousal this morning. Patient perseverating on R ankle pain stating he sprained it but is unable to elaborate how/when. Spouse reports previous two days patient was mobile without complaints of pain. Currently patient needing mod x2 to power up to standing and max multimodal cues to sequence. Patient legs semi buckling/shaky unable to advance and ultimately returned to sitting edge of bed after ~30 seconds. MD arrive and reports will order XR therefore further mobility deferred until results in. Will try to see later today or tomorrow as patient/family very hopeful about going home soon. Pending progress may need to update recommendations, however hopeful patient's mentation will clear as sedative clears.    Follow Up Recommendations  Home health OT;Supervision/Assistance - 24 hour    Equipment Recommendations  None recommended by OT       Precautions / Restrictions Precautions Precautions: Fall;Other (comment) Precaution Comments: urinary incontinence, R JP drain       Mobility Bed Mobility Overal bed mobility: Needs Assistance Bed Mobility: Supine to Sit;Sit to  Supine     Supine to sit: Mod assist;HOB elevated Sit to supine: Mod assist   General bed mobility comments: assist with sitting trunk upright and scooting out to edge of bed with bed pad. assist lifting LEs and guiding trunk back to bed    Transfers Overall transfer level: Needs assistance Equipment used:  (rollator) Transfers: Sit to/from Stand Sit to Stand: Mod assist;+2 physical assistance;+2 safety/equipment         General transfer comment: patient needing increased time, max multimodal cues and mod A x2 to power up to standing from edge of bed. limited standing tolerance due to ankle pain and unable to try and take steps foward/back ultimately returned to EOB after ~30 seconds standing. also note patient's LEs shaky/semi buckling in standing    Balance Overall balance assessment: Needs assistance Sitting-balance support: Feet supported Sitting balance-Leahy Scale: Fair     Standing balance support: Bilateral upper extremity supported Standing balance-Leahy Scale: Poor Standing balance comment: needs support from RW and therapist.                           ADL either performed or assessed with clinical judgement   ADL Overall ADL's : Needs assistance/impaired                     Lower Body Dressing: Total assistance;Sit to/from stand;Bed level Lower Body Dressing Details (indicate cue type and reason): to don socks bed level and to don brief, spouse reports assisting patient at home with dressing Toilet Transfer: Moderate assistance;+2 for physical assistance;Cueing for safety;Cueing for sequencing Toilet Transfer Details (indicate cue type and reason): sit to stand  from edge of bed. patient needing increased time and max multimodal cues to stand. patient tolerate standing for approximately 30 seconds with knees semi buckling/shaky before returned to sitting EOB. During session MD arrive and report will order ankle XR therefore further mobility  deferred Toileting- Clothing Manipulation and Hygiene: Total assistance Toileting - Clothing Manipulation Details (indicate cue type and reason): incontinent     Functional mobility during ADLs: Moderate assistance;+2 for physical assistance;+2 for safety/equipment                 Cognition Arousal/Alertness: Awake/alert Behavior During Therapy: Flat affect Overall Cognitive Status: Impaired/Different from baseline Area of Impairment: Memory;Following commands;Safety/judgement;Problem solving                     Memory: Decreased short-term memory Following Commands: Follows one step commands with increased time Safety/Judgement: Decreased awareness of safety   Problem Solving: Slow processing;Requires verbal cues;Requires tactile cues General Comments: patient's spouse reports patient had a seditive last night and not having as good of a day today as previous 2 days. Patient perseverating on "sprained my ankle" but is unable to elaborate when/how just that ankle hurts.                   Pertinent Vitals/ Pain       Pain Assessment: Faces Faces Pain Scale: Hurts whole lot Pain Location: R ankle Pain Descriptors / Indicators: Grimacing Pain Intervention(s): Limited activity within patient's tolerance         Frequency  Min 2X/week        Progress Toward Goals  OT Goals(current goals can now be found in the care plan section)  Progress towards OT goals: Not progressing toward goals - comment (new ankle pain this session, increased confusion)  Acute Rehab OT Goals Patient Stated Goal: to get stronger, clear the infection, get as many services at home as they can OT Goal Formulation: With family Time For Goal Achievement: 06/05/20 Potential to Achieve Goals: Good ADL Goals Pt Will Perform Grooming: with min guard assist;standing Pt Will Perform Upper Body Bathing: with supervision;sitting Pt Will Perform Upper Body Dressing: with  supervision;sitting Pt Will Transfer to Toilet: with min guard assist;ambulating;bedside commode Pt Will Perform Toileting - Clothing Manipulation and hygiene: with min assist;sit to/from stand Additional ADL Goal #1: Pt will perform bed mobility with min assist in preparation for ADL.  Plan Discharge plan remains appropriate;Other (comment) (pending progress)       AM-PAC OT "6 Clicks" Daily Activity     Outcome Measure   Help from another person eating meals?: A Little Help from another person taking care of personal grooming?: A Lot Help from another person toileting, which includes using toliet, bedpan, or urinal?: Total Help from another person bathing (including washing, rinsing, drying)?: A Lot Help from another person to put on and taking off regular upper body clothing?: A Little Help from another person to put on and taking off regular lower body clothing?: Total 6 Click Score: 12    End of Session Equipment Utilized During Treatment: Gait belt;Rolling walker  OT Visit Diagnosis: Other abnormalities of gait and mobility (R26.89);Unsteadiness on feet (R26.81);Muscle weakness (generalized) (M62.81);Other symptoms and signs involving cognitive function   Activity Tolerance Patient limited by pain   Patient Left in bed;with call bell/phone within reach;with family/visitor present   Nurse Communication Mobility status        Time: 0935-1001 OT Time Calculation (min): 26 min  Charges: OT General  Charges $OT Visit: 1 Visit OT Treatments $Self Care/Home Management : 8-22 mins  Delbert Phenix OT OT pager: [phone]   Rosemary Holms 05/27/2020, 11:47 AM

## 2020-05-27 NOTE — Progress Notes (Signed)
Physical Therapy Treatment Patient Details Name: [name] MRN: [id number] DOB: [birth year] Today's Date: 05/27/2020    History of Present Illness 87 y.o. male admitted on 05/15/20 who presents with concerns of rectal pain, found to have perirectal abscess, s/p I &D 05/16/20 and s/p 30F drain R renal subcapsular fluid collection 05/18/20.Marland Kitchen Also found to have 6g drop in hgb with heme pos stools, suspect GIB. 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    PT Comments    Patient's wife reports patient received meds overnight that have caused increased drowsiness. Patient also reporting" I sprained my ankle"(left). Patient is requiring much more assistance and unable to stand and bear weight on L foot. Noted jerking of extremities at times.  Patient has much different presentation than 2 days ago when patient ambulated 50' x 2.  continue to assess  And progress as patient tolerates. Wife is only caregiver  Follow Up Recommendations  Home health PT;Supervision/Assistance - 24 hour     Equipment Recommendations  None recommended by PT    Recommendations for Other Services       Precautions / Restrictions Precautions Precautions: Fall;Other (comment) Precaution Comments: urinary incontinence, R JP drain    Mobility  Bed Mobility Overal bed mobility: Needs Assistance Bed Mobility: Supine to Sit;Sit to Supine     Supine to sit: Mod assist;HOB elevated Sit to supine: Mod assist   General bed mobility comments: assist with sitting trunk upright and scooting out to edge of bed with bed pad. assist lifting LEs and guiding trunk back to bed    Transfers Overall transfer level: Needs assistance Equipment used:  (rollator) Transfers: Sit  to/from Stand Sit to Stand: Mod assist;+2 physical assistance;+2 safety/equipment         General transfer comment: patient needing increased time, max multimodal cues and mod A x2 to power up to standing from edge of bed. limited standing tolerance due to ankle pain and unable to try and take steps foward/back ultimately returned to EOB after ~30 seconds standing. also note patient's LEs shaky/semi buckling in standing  Ambulation/Gait                 Stairs             Wheelchair Mobility    Modified Rankin (Stroke Patients Only)       Balance Overall balance assessment: Needs assistance Sitting-balance support: Feet supported Sitting balance-Leahy Scale: Fair     Standing balance support: Bilateral upper extremity supported Standing balance-Leahy Scale: Poor Standing balance comment: needs support from RW and therapist.                            Cognition Arousal/Alertness: Lethargic;Suspect due to medications Behavior During Therapy: Flat affect Overall Cognitive Status: Impaired/Different from baseline Area of Impairment: Memory;Following commands;Safety/judgement;Problem solving                     Memory: Decreased short-term memory Following Commands: Follows one step commands with increased time Safety/Judgement: Decreased awareness of safety   Problem Solving: Slow processing;Requires verbal cues;Requires tactile cues General Comments: patient's spouse reports patient had a seditive last night and not having as good of a day today as previous 2 days. Patient perseverating  on "sprained my ankle" but is unable to elaborate when/how just that ankle hurts.      Exercises      General Comments        Pertinent Vitals/Pain Pain Assessment: Faces Faces Pain Scale: Hurts whole lot Pain Location: left ankle Pain Descriptors / Indicators: Grimacing Pain Intervention(s): Monitored during session;Limited activity within  patient's tolerance    Home Living                      Prior Function            PT Goals (current goals can now be found in the care plan section) Acute Rehab PT Goals Patient Stated Goal: to get stronger, clear the infection, get as many services at home as they can Progress towards PT goals: Not progressing toward goals - comment (lethargic and left ankle pain)    Frequency    Min 3X/week      PT Plan Current plan remains appropriate (unless he is not able to ambulate as he did 2 days ago)    Co-evaluation PT/OT/SLP Co-Evaluation/Treatment: Yes Reason for Co-Treatment: For patient/therapist safety PT goals addressed during session: Mobility/safety with mobility OT goals addressed during session: ADL's and self-care      AM-PAC PT "6 Clicks" Mobility   Outcome Measure  Help needed turning from your back to your side while in a flat bed without using bedrails?: A Lot Help needed moving from lying on your back to sitting on the side of a flat bed without using bedrails?: A Lot Help needed moving to and from a bed to a chair (including a wheelchair)?: A Lot Help needed standing up from a chair using your arms (e.g., wheelchair or bedside chair)?: A Lot Help needed to walk in hospital room?: Total Help needed climbing 3-5 steps with a railing? : Total 6 Click Score: 10    End of Session Equipment Utilized During Treatment: Gait belt Activity Tolerance: Patient limited by fatigue;No increased pain Patient left: in bed;with call bell/phone within reach;with bed alarm set;with family/visitor present Nurse Communication: Mobility status PT Visit Diagnosis: Muscle weakness (generalized) (M62.81);Difficulty in walking, not elsewhere classified (R26.2);Pain Pain - Right/Left: Left Pain - part of body: Ankle and joints of foot     Time: 0935-1001 PT Time Calculation (min) (ACUTE ONLY): 26 min  Charges:  $Therapeutic Activity: 8-22 mins                      Tresa Endo PT Acute Rehabilitation Services Pager [phone] Office [phone]    Claretha Cooper 05/27/2020, 12:29 PM

## 2020-05-27 NOTE — Progress Notes (Signed)
ANTICOAGULATION CONSULT NOTE - Follow up [facility] for Warfarin Indication: history of DVT/PE, atrial fibrillation   Allergies  Allergen Reactions  . Darifenacin Nausea Only and Other (See Comments)    dry mouth and dizziness ENABLEX Dizziness Pt denies  . Codeine Other (See Comments)  . Sulfamethoxazole-Trimethoprim Other (See Comments)  . Lisinopril Cough    Pt denies    Patient Measurements: Height: 6' (182.9 cm) Weight: 97.1 kg (214 lb) IBW/kg (Calculated) : 77.6  Labs: Recent Labs    05/25/20 0512 05/26/20 0408 05/27/20 0348  HGB 11.0* 10.3* 11.6*  HCT 34.5* 32.8* 36.8*  PLT 355 322 348  LABPROT 30.4* 31.1* 26.9*  INR 2.9* 3.0* 2.5*  CREATININE 0.99 1.04 1.18    Estimated Creatinine Clearance: 53.3 mL/min (by C-G formula based on SCr of 1.18 mg/dL).  Medications:  Home warfarin regimen: 2.5mg  PO daily-last dose reported 05/14/2020 at 2100.  INR supratherapeutic on admission at 3.3.   Assessment: 68 y/oM with PMH of prostate CA, aortic stenosis s/p TAVR, atrial fibrillation, history of recurrent  (remote) DVT/PE s/p IVC filter on warfarin who presented with rectal bleeding, supratherapeutic INR, and anemia.  Significant events:  4/9 Vitamin K 2.5mg  PO x 1  4/10: INR 3.4 in AM; Urgent I&D of perirectal abscess, given KCentra and Vitamin K 10mg  IV x 1 pre-procedure. Postprocedure INR 1.4 and Warfarin resumed in PM  4/11: warfarin held for drainage of R subcapsular renal fluid collection  4/14: resuming warfarin  Today, 05/27/2020:  INR 2.5, decreased after holding x2 days (4/19, 4/20), but remains therapeutic   Hgb low/relatively stable at 11.6, Plt stable WNL  No further rectal bleeding noted or procedures planned  Meal intake not charted   No major DDIs with warfarin noted  Goal of Therapy:  INR 2-3 Monitor platelets by anticoagulation protocol: Yes   Plan:   Warfarin 2.5 mg PO today at 16:00  Daily PT/INR  Monitor CBC and  for s/sx of bleeding   Gretta Arab PharmD, BCPS Clinical Pharmacist [facility] main pharmacy [phone] 05/27/2020 9:49 AM

## 2020-05-28 DIAGNOSIS — I459 Conduction disorder, unspecified: Secondary | ICD-10-CM | POA: Diagnosis not present

## 2020-05-28 DIAGNOSIS — K219 Gastro-esophageal reflux disease without esophagitis: Secondary | ICD-10-CM | POA: Diagnosis not present

## 2020-05-28 DIAGNOSIS — A498 Other bacterial infections of unspecified site: Secondary | ICD-10-CM

## 2020-05-28 DIAGNOSIS — I5042 Chronic combined systolic (congestive) and diastolic (congestive) heart failure: Secondary | ICD-10-CM | POA: Diagnosis not present

## 2020-05-28 DIAGNOSIS — K5900 Constipation, unspecified: Secondary | ICD-10-CM | POA: Diagnosis not present

## 2020-05-28 DIAGNOSIS — N200 Calculus of kidney: Secondary | ICD-10-CM | POA: Diagnosis not present

## 2020-05-28 DIAGNOSIS — I4821 Permanent atrial fibrillation: Secondary | ICD-10-CM | POA: Diagnosis not present

## 2020-05-28 DIAGNOSIS — I130 Hypertensive heart and chronic kidney disease with heart failure and stage 1 through stage 4 chronic kidney disease, or unspecified chronic kidney disease: Secondary | ICD-10-CM | POA: Diagnosis not present

## 2020-05-28 DIAGNOSIS — K611 Rectal abscess: Secondary | ICD-10-CM | POA: Diagnosis not present

## 2020-05-28 DIAGNOSIS — I252 Old myocardial infarction: Secondary | ICD-10-CM | POA: Diagnosis not present

## 2020-05-28 DIAGNOSIS — N1832 Chronic kidney disease, stage 3b: Secondary | ICD-10-CM | POA: Diagnosis not present

## 2020-05-28 DIAGNOSIS — N119 Chronic tubulo-interstitial nephritis, unspecified: Secondary | ICD-10-CM | POA: Diagnosis not present

## 2020-05-28 DIAGNOSIS — I2510 Atherosclerotic heart disease of native coronary artery without angina pectoris: Secondary | ICD-10-CM | POA: Diagnosis not present

## 2020-05-28 LAB — MAGNESIUM: Magnesium: 1.9 mg/dL (ref 1.7–2.4)

## 2020-05-28 LAB — CBC WITH DIFFERENTIAL/PLATELET
Abs Immature Granulocytes: 0.04 10*3/uL (ref 0.00–0.07)
Basophils Absolute: 0.1 10*3/uL (ref 0.0–0.1)
Basophils Relative: 1 %
Eosinophils Absolute: 0.1 10*3/uL (ref 0.0–0.5)
Eosinophils Relative: 2 %
HCT: 32.4 % — ABNORMAL LOW (ref 39.0–52.0)
Hemoglobin: 10.2 g/dL — ABNORMAL LOW (ref 13.0–17.0)
Immature Granulocytes: 1 %
Lymphocytes Relative: 13 %
Lymphs Abs: 0.9 10*3/uL (ref 0.7–4.0)
MCH: 25.2 pg — ABNORMAL LOW (ref 26.0–34.0)
MCHC: 31.5 g/dL (ref 30.0–36.0)
MCV: 80 fL (ref 80.0–100.0)
Monocytes Absolute: 0.7 10*3/uL (ref 0.1–1.0)
Monocytes Relative: 10 %
Neutro Abs: 5.4 10*3/uL (ref 1.7–7.7)
Neutrophils Relative %: 73 %
Platelets: 274 10*3/uL (ref 150–400)
RBC: 4.05 MIL/uL — ABNORMAL LOW (ref 4.22–5.81)
RDW: 18.2 % — ABNORMAL HIGH (ref 11.5–15.5)
WBC: 7.2 10*3/uL (ref 4.0–10.5)
nRBC: 0 % (ref 0.0–0.2)

## 2020-05-28 LAB — COMPREHENSIVE METABOLIC PANEL
ALT: 7 U/L (ref 0–44)
AST: 17 U/L (ref 15–41)
Albumin: 2.1 g/dL — ABNORMAL LOW (ref 3.5–5.0)
Alkaline Phosphatase: 71 U/L (ref 38–126)
Anion gap: 7 (ref 5–15)
BUN: 16 mg/dL (ref 8–23)
CO2: 28 mmol/L (ref 22–32)
Calcium: 9.6 mg/dL (ref 8.9–10.3)
Chloride: 104 mmol/L (ref 98–111)
Creatinine, Ser: 1.09 mg/dL (ref 0.61–1.24)
GFR, Estimated: >60 mL/min (ref >60)
Glucose, Bld: 133 mg/dL — ABNORMAL HIGH (ref 70–99)
Potassium: 4.2 mmol/L (ref 3.5–5.1)
Sodium: 139 mmol/L (ref 135–145)
Total Bilirubin: 1 mg/dL (ref 0.3–1.2)
Total Protein: 5.9 g/dL — ABNORMAL LOW (ref 6.5–8.1)

## 2020-05-28 LAB — PROTIME-INR
INR: 2.7 — ABNORMAL HIGH (ref 0.8–1.2)
Prothrombin Time: 28.9 seconds — ABNORMAL HIGH (ref 11.4–15.2)

## 2020-05-28 LAB — PHOSPHORUS: Phosphorus: 3 mg/dL (ref 2.5–4.6)

## 2020-05-28 MED ORDER — POLYETHYLENE GLYCOL 3350 17 G PO PACK: 17.0000 g | PACK | Freq: Two times a day (BID) | ORAL | 0 refills | Status: DC

## 2020-05-28 MED ORDER — SODIUM CHLORIDE 0.9% FLUSH
10.0000 mL | Freq: Two times a day (BID) | INTRAVENOUS | Status: DC
  Administered 2020-05-28: 10 mL

## 2020-05-28 MED ORDER — PANTOPRAZOLE SODIUM 40 MG PO TBEC: 40.0000 mg | DELAYED_RELEASE_TABLET | Freq: Every day | ORAL | 0 refills | Status: DC

## 2020-05-28 MED ORDER — HYDROCODONE-ACETAMINOPHEN 5-325 MG PO TABS: 1.0000 | ORAL_TABLET | ORAL | 0 refills | Status: DC | PRN

## 2020-05-28 MED ORDER — WARFARIN SODIUM 2.5 MG PO TABS: 2.5000 mg | ORAL_TABLET | Freq: Once | ORAL | Status: DC

## 2020-05-28 MED ORDER — SODIUM CHLORIDE 0.9% FLUSH: 10.0000 mL | INTRAVENOUS | Status: DC | PRN

## 2020-05-28 NOTE — Progress Notes (Signed)
Physical Therapy Treatment Patient Details Name: [name] MRN: [id number] DOB: 15-Jun-1931 Today's Date: 05/28/2020    History of Present Illness 87 y.o. male admitted on 05/15/20 who presents with concerns of rectal pain, found to have perirectal abscess, s/p I &D 05/16/20 and s/p 31F drain R renal subcapsular fluid collection 05/18/20.Marland Kitchen Also found to have 6g drop in hgb with heme pos stools, suspect GIB. 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    PT Comments    Pt OOB in recliner with spouse in room.  Assisted with transfers and gait. General transfer comment: 50% VC's on proper hand placement to push up vs pull up from walker.  Spouse stated "he is use to his lift chair at home".  Assisted from recliner to [facility] then from [facility] to amb. Uncontrolled desend on BSC with 50% VC's to "reach back" prior and use B LE to control stand to sit.  General Gait Details: amb a limited distance around room, with walker self able to navigate around bed and complete turns.  Pt was slightly forward flex posture but present with steady gait. Assisted back to recliner and positioned to comfort.     Follow Up Recommendations  Home health PT;Supervision/Assistance - 24 hour     Equipment Recommendations  None recommended by PT    Recommendations for Other Services       Precautions / Restrictions Precautions Precautions: Fall Precaution Comments: urinary incontinence, R JP drain Restrictions Weight Bearing Restrictions: No    Mobility  Bed Mobility Overal bed mobility: Needs Assistance Bed Mobility: Supine to Sit     Supine to sit: Mod assist;HOB elevated     General bed mobility comments: OOB in recliner via OT    Transfers Overall transfer  level: Needs assistance Equipment used: Rolling walker (2 wheeled) Transfers: Sit to/from [facility] Sit to Stand: Min guard Stand pivot transfers: Min guard       General transfer comment: 50% VC's on proper hand placement to push up vs pull up from walker.  Spouse stated "he is use to his lift chair at home".  Assisted from recliner to [facility] then from [facility] to amb. Uncontrolled desend on BSC with 50% VC's to "reach back" prior and use B LE to control stand to sit.  Ambulation/Gait Ambulation/Gait assistance: Supervision;Min guard Gait Distance (Feet): 10 Feet Assistive device: Rolling walker (2 wheeled) Gait Pattern/deviations: Decreased stride length;Step-through pattern;Trunk flexed Gait velocity: decreased   General Gait Details: amb a limited distance around room, with walker self able to navigate around bed and complete turns.  Pt was slightly forward flex posture but present with steady gait.   Stairs             Wheelchair Mobility    Modified Rankin (Stroke Patients Only)       Balance Overall balance assessment: Needs assistance Sitting-balance support: Feet supported Sitting balance-Leahy Scale: Fair     Standing balance support: Bilateral upper extremity supported Standing balance-Leahy Scale: Poor Standing balance comment: needs support from RW and therapist.                            Cognition Arousal/Alertness: Awake/alert Behavior During Therapy: WFL for tasks assessed/performed Overall Cognitive Status: Within Functional  Limits for tasks assessed                                 General Comments: AxO x 3 happy to be going home today      Exercises      General Comments        Pertinent Vitals/Pain Pain Assessment: No/denies pain Faces Pain Scale: Hurts little more Pain Location: ankles Pain Descriptors / Indicators: Grimacing;Sore Pain Intervention(s): Monitored during session    Home Living                       Prior Function            PT Goals (current goals can now be found in the care plan section) Acute Rehab PT Goals Patient Stated Goal: to get stronger, clear the infection, get as many services at home as they can Progress towards PT goals: Progressing toward goals    Frequency    Min 3X/week      PT Plan Current plan remains appropriate    Co-evaluation              AM-PAC PT "6 Clicks" Mobility   Outcome Measure  Help needed turning from your back to your side while in a flat bed without using bedrails?: A Little Help needed moving from lying on your back to sitting on the side of a flat bed without using bedrails?: A Little Help needed moving to and from a bed to a chair (including a wheelchair)?: A Little Help needed standing up from a chair using your arms (e.g., wheelchair or bedside chair)?: A Little Help needed to walk in hospital room?: A Little Help needed climbing 3-5 steps with a railing? : A Lot 6 Click Score: 17    End of Session Equipment Utilized During Treatment: Gait belt Activity Tolerance: Patient tolerated treatment well Patient left: in chair;with family/visitor present Nurse Communication: Mobility status PT Visit Diagnosis: Muscle weakness (generalized) (M62.81);Difficulty in walking, not elsewhere classified (R26.2);Pain     Time: 1105-1130 PT Time Calculation (min) (ACUTE ONLY): 25 min  Charges:  $Gait Training: 8-22 mins $Therapeutic Activity: 8-22 mins                     Rica Koyanagi  PTA Acute  Rehabilitation Services Pager      [phone] Office      [phone]

## 2020-05-28 NOTE — Progress Notes (Signed)
Referring Physician(s): Chiu,S  Supervising Physician: Mir, Biochemist, clinical  Patient Status:  [facility] - In-pt  Chief Complaint: Right renal subcapsular abscess   Subjective: Pt sitting up in chair; more alert/talkative today; wife in room; denies worsening flank pain,N/V   Allergies: Darifenacin, Codeine, Sulfamethoxazole-trimethoprim, and Lisinopril  Medications: Prior to Admission medications   Medication Sig Start Date End Date Taking? Authorizing Provider  acetaminophen (TYLENOL) 500 MG tablet Take 500 mg by mouth at bedtime.    Yes [provider]  amLODipine (NORVASC) 2.5 MG tablet Take 1 tablet (2.5 mg total) by mouth daily. 09/03/19  Yes Tobb, Kardie, DO  Ascorbic Acid (VITAMIN C) 1000 MG tablet Take 1,000 mg by mouth daily.   Yes [provider]  Biotin 2500 MCG CAPS Take 2,500 mcg by mouth daily.   Yes [provider]  cholecalciferol (VITAMIN D) 1000 UNITS tablet Take 1,000 Units by mouth daily.   Yes [provider]  docusate sodium (COLACE) 50 MG capsule Take 50 mg by mouth daily.   Yes [provider]  ertapenem (INVANZ) IVPB Inject 1 g into the vein daily for 22 days. Indication:  ESBL renal abscess  First Dose: No Last Day of Therapy:  06/17/2020 Labs - Once weekly:  CBC/D and BMP, Labs - Every other week:  ESR and CRP Method of administration: Mini-Bag Plus / Gravity Method of administration may be changed at the discretion of home infusion pharmacist based upon assessment of the patient and/or caregiver's ability to self-administer the medication ordered. 05/27/20 06/18/20 Yes Sheikh, Omair Latif, DO  furosemide (LASIX) 20 MG tablet Take 20 mg by mouth daily.   Yes [provider]  Glucosamine HCl (GLUCOSAMINE PO) Take 2,000 mg by mouth daily.   Yes [provider]  isosorbide mononitrate (IMDUR) 30 MG 24 hr tablet Take 0.5 tablets (15 mg total) by mouth daily. 02/05/20  Yes Richardo Priest, MD  Multiple  Vitamin (MULTIVITAMIN) capsule Take 1 capsule by mouth daily.  04/28/19  Yes [provider]  nitroGLYCERIN (NITROSTAT) 0.4 MG SL tablet Place 1 tablet (0.4 mg total) under the tongue every 5 (five) minutes as needed. 07/14/19  Yes Richardo Priest, MD  Omega-3 Fatty Acids (FISH OIL) 1000 MG CAPS Take 1,000 mg by mouth daily.   Yes [provider]  omeprazole (PRILOSEC) 20 MG capsule Take 1 capsule (20 mg total) by mouth daily. 04/21/20  Yes Richardo Priest, MD  pravastatin (PRAVACHOL) 20 MG tablet TAKE ONE TABLET BY MOUTH ONCE DAILY Patient taking differently: Take 20 mg by mouth daily. 01/08/20  Yes Richardo Priest, MD  vitamin B-12 (CYANOCOBALAMIN) 500 MCG tablet Take 500 mcg by mouth daily.   Yes [provider]  warfarin (COUMADIN) 2.5 MG tablet Take 1 tablet daily or as directed by [facility] Patient taking differently: Take 2.5 mg by mouth daily. 04/16/20  Yes Richardo Priest, MD  cefpodoxime (VANTIN) 200 MG tablet Take 1 tablet (200 mg total) by mouth 2 (two) times daily. Patient not taking: No sig reported 04/23/20   Molpus, Jenny Reichmann, MD     Vital Signs: BP 133/70 (BP Location: Left Wrist)   Pulse 61   Temp 98 F (36.7 C) (Oral)   Resp 20   Ht 6' (1.829 m)   Wt 214 lb (97.1 kg)   SpO2 98%   BMI 29.02 kg/m   Physical Exam awake, answers simple questions ok, talks in soft voice; rt flank drain intact, insertion site  ok, not sig tender, OP 30 cc yellow fluid  Imaging: DG Ankle Complete Left  Result Date: 05/27/2020 CLINICAL DATA:  Left ankle pain. EXAM: LEFT ANKLE COMPLETE - 3+ VIEW COMPARISON:  No recent. FINDINGS: Diffuse osteopenia. Diffuse degenerative change. Small bony densities noted adjacent to the medial and lateral malleoli as well as over the dorsal talus. These may represent small fracture fragments, possibly old. Surgical screw noted of foot. Peripheral vascular calcification. IMPRESSION: 1. Diffuse osteopenia and degenerative change. Small bony  densities noted adjacent to the medial and lateral malleoli as well as over the dorsal talus. These may represent small fracture fragments, possibly old. 2.  Peripheral vascular disease. Electronically Signed   By: Marcello Moores  Register   On: 05/27/2020 12:56   CT ABDOMEN PELVIS W CONTRAST  Result Date: 05/25/2020 CLINICAL DATA:  Enlarging right subcapsular perinephric fluid collection, status post drain catheter placement 05/18/2020 EXAM: CT ABDOMEN AND PELVIS WITH CONTRAST TECHNIQUE: Multidetector CT imaging of the abdomen and pelvis was performed using the standard protocol following bolus administration of intravenous contrast. CONTRAST:  127m OMNIPAQUE IOHEXOL 300 MG/ML  SOLN COMPARISON:  05/15/2020 and previous FINDINGS: Lower chest: Trace pericardial effusions as before. Transvenous pacing leads partially visualized. Status post AVR. Trace pericardial effusion. Patchy subsegmental atelectasis/consolidation posteriorly in the lung bases. Hepatobiliary: Liver unremarkable. Several subcentimeter partially calcified stones in the dependent aspect of the nondilated gallbladder. Pancreas: Unremarkable. No pancreatic ductal dilatation or surrounding inflammatory changes. Spleen: Normal in size without focal abnormality. Adrenals/Urinary Tract: Adrenal glands unremarkable. 1.4 cm mid left renal cyst. Urinary bladder nondistended. On the right, interval placement of percutaneous drain catheter with partial decompression of the perinephric collection, residual component 4.5 x 1.8 cm. Right nephrolithiasis, largest stone 1.9 cm centrally in the collecting system, nonobstructive. High attenuation material in the distended renal collecting system. Ureters decompressed. Enlarging 2 cm and 1.6 cm collections adjacent to the lower pole may represent developing small perinephric abscesses. Regional inflammatory changes persist. Stomach/Bowel: Stomach and small bowel are nondilated. The colon is decompressed, unremarkable.  Vascular/Lymphatic: Heavy extensive aortic and iliofemoral calcified atheromatous plaque without aneurysm or evident high-grade stenosis. No abdominal or pelvic adenopathy. Infrarenal IVC filter without suggestion of caval thrombosis. Reproductive: Previous prostatectomy. Other: No ascites.  No free air. Musculoskeletal: Multilevel lumbar spondylitic change. Negative for fracture or worrisome bone lesion. For IMPRESSION: 1. Partial decompression of right perinephric collection post percutaneous drain catheter placement. 2. Enlarging 2 cm and 1.6 cm lower pole collections may represent developing small perinephric abscesses. 3. Right nephrolithiasis with possible pyonephrosis 4. Cholelithiasis. Aortic Atherosclerosis (ICD10-I70.0). Electronically Signed   By: DLucrezia EuropeM.D.   On: 05/25/2020 08:23   DG CHEST PORT 1 VIEW  Result Date: 05/27/2020 CLINICAL DATA:  PICC line placement. EXAM: PORTABLE CHEST 1 VIEW COMPARISON:  04/22/2020. FINDINGS: PICC line noted with tip over SVC. AICD noted stable position. Prior cardiac valve replacement. Stable cardiomegaly. No pulmonary venous congestion. Mild right infrahilar atelectasis/infiltrate cannot be excluded. Stable left base pleural thickening consistent scarring. No pleural effusion. No pneumothorax. Prominent degenerative changes both shoulders. IMPRESSION: 1.  Right PICC line noted with tip over SVC. 2. AICD in stable position. Prior cardiac valve replacement. Stable cardiomegaly. No pulmonary venous congestion. 3.  Mild right infrahilar atelectasis/infiltrate cannot be excluded. Electronically Signed   By: TMarcello Moores Register   On: 05/27/2020 11:27   [place] SITE RITE  Result Date: 05/26/2020 If Site Rite image not attached, placement could not be confirmed due to current cardiac rhythm.  Labs:  CBC: Recent Labs    05/25/20 0512 05/26/20 0408 05/27/20 0348 05/28/20 0450  WBC 10.8* 9.7 10.7* 7.2  HGB 11.0* 10.3* 11.6* 10.2*  HCT 34.5* 32.8* 36.8*  32.4*  PLT 355 322 348 274    COAGS: Recent Labs    04/22/20 2253 04/27/20 1102 05/25/20 0512 05/26/20 0408 05/27/20 0348 05/28/20 0450  INR 2.7*   < > 2.9* 3.0* 2.5* 2.7*  APTT 43*  --   --   --   --   --    < > = values in this interval not displayed.    BMP: Recent Labs    07/14/19 1343 08/24/19 2011 09/08/19 0920 04/22/20 2253 05/25/20 0512 05/26/20 0408 05/27/20 0348 05/28/20 0450  NA 143 139 141   < > 137 135 134* 139  K 4.4 3.9 4.1   < > 4.2 3.9 4.2 4.2  CL 109* 108 106   < > 104 102 101 104  CO2 18* 23 22   < > '26 26 27 28  ' GLUCOSE 131* 110* 107*   < > 113* 113* 124* 133*  BUN 30* 19 22   < > '15 18 18 16  ' CALCIUM 10.4* 9.6 10.0   < > 9.4 9.2 9.5 9.6  CREATININE 1.66* 1.43* 1.42*   < > 0.99 1.04 1.18 1.09  GFRNONAA 37* 44* 44*   < > >60 >60 60* >60  GFRAA 43* 51* 51*  --   --   --   --   --    < > = values in this interval not displayed.    LIVER FUNCTION TESTS: Recent Labs    05/18/20 0449 05/19/20 0515 05/27/20 0348 05/28/20 0450  BILITOT 0.8 1.1 1.0 1.0  AST '20 16 21 17  ' ALT '9 8 8 7  ' ALKPHOS 69 68 77 71  PROT 5.8* 6.0* 6.7 5.9*  ALBUMIN 2.2* 2.4* 2.5* 2.1*    Assessment and Plan: Patient with history of recent I&D of perirectal abscess, right nephrolithiasis/pyelonephritis with enlarging right renal subcapsular fluid collection, status post drain placement on 4/12;afebrile, WBC nl;  hemoglobin 10.2(11.6), creatinine normal; latest CT of abdomen/ pelvis was reviewed by Dr. Annamaria Boots and reveals partial decompression of the right perinephric abscess, enlarging 2 cm and 1.6 cm lower pole collections, right nephrolithiasis with possible pyelonephrosis and cholelithiasis; no plans for further intervention at this time.  Continue with drain irrigation, output monitoring; follow-up scan in 1 to 2 weeks; if patient discharged home with drain recommend once daily irrigation with 5 cc sterile normal saline, output recording and gauze dressing changes every 1 to  2 days; he can be scheduled to undergo follow-up imaging at IR drain clinic once discharge plans finalized.   Electronically Signed: D. Rowe Robert, PA-C 05/28/2020, 9:33 AM   I spent a total of 15 minutes at the the patient's bedside AND on the patient's hospital floor or unit, greater than 50% of which was counseling/coordinating care for right renal subcapsular abscess drain    Patient ID: [name], male   DOB: 08-22-1931, 87 y.o.   MRN: [id number]

## 2020-05-28 NOTE — Progress Notes (Signed)
ANTICOAGULATION CONSULT NOTE - Follow up [facility] for Warfarin Indication: history of DVT/PE, atrial fibrillation   Allergies  Allergen Reactions  . Darifenacin Nausea Only and Other (See Comments)    dry mouth and dizziness ENABLEX Dizziness Pt denies  . Codeine Other (See Comments)  . Sulfamethoxazole-Trimethoprim Other (See Comments)  . Lisinopril Cough    Pt denies    Patient Measurements: Height: 6' (182.9 cm) Weight: 97.1 kg (214 lb) IBW/kg (Calculated) : 77.6  Labs: Recent Labs    05/26/20 0408 05/27/20 0348 05/28/20 0450  HGB 10.3* 11.6* 10.2*  HCT 32.8* 36.8* 32.4*  PLT 322 348 274  LABPROT 31.1* 26.9* 28.9*  INR 3.0* 2.5* 2.7*  CREATININE 1.04 1.18 1.09    Estimated Creatinine Clearance: 57.7 mL/min (by C-G formula based on SCr of 1.09 mg/dL).  Medications:  Home warfarin regimen: 2.5mg  PO daily-last dose reported 05/14/2020 at 2100.  INR supratherapeutic on admission at 3.3.   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  Significant events:  4/9 Vitamin K 2.5mg  PO x 1  4/10: INR 3.4 in AM; Urgent I&D of perirectal abscess, given KCentra and Vitamin K 10mg  IV x 1 pre-procedure. Postprocedure INR 1.4 and Warfarin resumed in PM  4/11: warfarin held for drainage of R subcapsular renal fluid collection  4/14: resuming warfarin  Today, 05/28/2020:  INR 2.7, after holding warfarin x2 days (4/19, 4/20) then giving 2.5mg  on 4/21   Hgb low/relatively stable at 10.2, Plt stable WNL  No further rectal bleeding noted or procedures planned  Meal intake not charted   No major DDIs with warfarin noted  Goal of Therapy:  INR 2-3 Monitor platelets by anticoagulation protocol: Yes   Plan:   Repeat warfarin 2.5 mg PO today at 16:00  Daily PT/INR  Monitor  CBC and for s/sx of bleeding    Adrian Saran, PharmD, BCPS Secure Chat if ?s 05/28/2020 8:17 AM

## 2020-05-28 NOTE — Progress Notes (Signed)
Occupational Therapy Treatment Patient Details Name: [name] MRN: [id number] DOB: Apr 11, 1931 Today's Date: 05/28/2020    History of present illness 87 y.o. male admitted on 05/15/20 who presents with concerns of rectal pain, found to have perirectal abscess, s/p I &D 05/16/20 and s/p 27F drain R renal subcapsular fluid collection 05/18/20.Marland Kitchen Also found to have 6g drop in hgb with heme pos stools, suspect GIB. 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   OT comments  Patient much more alert this session, spouse reports patient had a better night and is much clearer this morning than previous day's session with therapy. Overall patient min A for functional transfer to bedside commode then recliner. Needs significant time to take small steps to complete transfers and min A for balance/safety. Total A in standing for peri care after bowel movement. Patient is still reporting pain in ankles, referring more to R ankle pain this morning and needed two attempts to stand from edge of bed before transferring. If spouse feels confident providing min A for functional transfers at home, continue to recommend [facility] services.    Follow Up Recommendations  Home health OT;Supervision/Assistance - 24 hour    Equipment Recommendations  None recommended by OT       Precautions / Restrictions Precautions Precautions: Fall;Other (comment) Precaution Comments: urinary incontinence, R JP drain Restrictions Weight Bearing Restrictions: No       Mobility Bed Mobility Overal bed mobility: Needs Assistance Bed Mobility: Supine to Sit     Supine to sit: Mod assist;HOB elevated     General bed mobility comments: mod A for trunk support to sit upright, increased time  with multimodal cues to assist scooting out to edge of bed    Transfers Overall transfer level: Needs assistance Equipment used: Rolling walker (2 wheeled) Transfers: Sit to/from Stand Sit to Stand: Min assist         General transfer comment: please see toilet transfer in ADL section, improved from previous session however still needing significant time to take small steps to transfer    Balance Overall balance assessment: Needs assistance Sitting-balance support: Feet supported Sitting balance-Leahy Scale: Fair     Standing balance support: Bilateral upper extremity supported Standing balance-Leahy Scale: Poor Standing balance comment: needs support from RW and therapist.                           ADL either performed or assessed with clinical judgement   ADL Overall ADL's : Needs assistance/impaired                         Toilet Transfer: Minimal assistance;Cueing for safety;Cueing for sequencing;Ambulation;RW;BSC Toilet Transfer Details (indicate cue type and reason): with initial stand patient reports pain in ankles R > L and sits back onto bed. with encouragement patient able to power up to standing with min A and slowly take small steps towards bedside commode. min A for safety Toileting- Clothing Manipulation and Hygiene: Total assistance Toileting - Clothing Manipulation Details (indicate cue type and reason): patient able to have bowel movement, total A in standing for peri care     Functional mobility during ADLs: Minimal assistance;Rolling walker;Cueing for safety;Cueing for sequencing General ADL Comments: patient still needing  increased time and taking small shuffled steps from [facility] then recliner but much improved from yesterday's session needing only min A for safety/balance.               Cognition Arousal/Alertness: Awake/alert Behavior During Therapy: WFL for tasks assessed/performed Overall Cognitive Status: Within Functional  Limits for tasks assessed                                 General Comments: patient appears much more alert this morning and following directions with only minimal repetition. Spouse reports patient is much closer to his baseline mentation compared to yesterday's session                   Pertinent Vitals/ Pain       Pain Assessment: Faces Faces Pain Scale: Hurts little more Pain Location: ankles Pain Descriptors / Indicators: Grimacing;Sore Pain Intervention(s): Monitored during session         Frequency  Min 2X/week        Progress Toward Goals  OT Goals(current goals can now be found in the care plan section)  Progress towards OT goals: Progressing toward goals  Acute Rehab OT Goals Patient Stated Goal: to get stronger, clear the infection, get as many services at home as they can OT Goal Formulation: With family Time For Goal Achievement: 06/05/20 Potential to Achieve Goals: Good ADL Goals Pt Will Perform Grooming: with min guard assist;standing Pt Will Perform Upper Body Bathing: with supervision;sitting Pt Will Perform Upper Body Dressing: with supervision;sitting Pt Will Transfer to Toilet: with min guard assist;ambulating;bedside commode Pt Will Perform Toileting - Clothing Manipulation and hygiene: with min assist;sit to/from stand Additional ADL Goal #1: Pt will perform bed mobility with min assist in preparation for ADL.  Plan Discharge plan remains appropriate       AM-PAC OT "6 Clicks" Daily Activity     Outcome Measure   Help from another person eating meals?: A Little Help from another person taking care of personal grooming?: A Little Help from another person toileting, which includes using toliet, bedpan, or urinal?: A Lot Help from another person bathing (including washing, rinsing, drying)?: A Lot Help from another person to put on and taking off regular upper body clothing?: A Little Help from another person to put on and  taking off regular lower body clothing?: Total 6 Click Score: 14    End of Session Equipment Utilized During Treatment: Rolling walker  OT Visit Diagnosis: Other abnormalities of gait and mobility (R26.89);Unsteadiness on feet (R26.81);Muscle weakness (generalized) (M62.81);Other symptoms and signs involving cognitive function   Activity Tolerance Patient tolerated treatment well   Patient Left in chair;with call bell/phone within reach;with chair alarm set;with family/visitor present   Nurse Communication Mobility status        Time: 4097-3532 OT Time Calculation (min): 30 min  Charges: OT General Charges $OT Visit: 1 Visit OT Treatments $Self Care/Home Management : 23-37 mins  Delbert Phenix OT OT pager: [place] 05/28/2020, 11:18 AM

## 2020-05-28 NOTE — TOC Transition Note (Signed)
Transition of Care [facility]) - CM/SW Discharge Note   Patient Details  Name: [name] MRN: [id number] Date of Birth: April 03, 1931  [facility] CM/SW Contact:  Trish Mage, LCSW Phone Number: 05/28/2020, 10:09 AM   Clinical Narrative:   Patient will d/c today with [facility] for infusion and [facility] for [facility] services.  Orders seen and appreciated. No further needs identified.  TOC sign off.    Final next level of care: [place] Barriers to Discharge: No Barriers Identified   Patient Goals and CMS Choice        Discharge Placement                       Discharge Plan and Services                                     Social Determinants of Health (SDOH) Interventions     Readmission Risk Interventions Readmission Risk Prevention Plan 05/27/2020  Transportation Screening Complete  Medication Review ([place]) Complete  PCP or Specialist appointment within 3-5 days of discharge Not Complete  PCP/Specialist Appt Not Complete comments Specialist appointment in 7 days  [place] or [place] Not Complete  SW Recovery Care/Counseling Consult Not Complete  Palliative Care Screening Complete  [place] Not Applicable  Some recent data might be hidden

## 2020-05-28 NOTE — Discharge Summary (Signed)
Physician Discharge Summary  Gurjit Loconte Schomburg HEN:[id number] DOB: 24-Jan-1932 DOA: 05/15/2020  PCP: Shirline Frees, MD  Admit date: 05/15/2020 Discharge date: 05/28/2020  Admitted From: Home Disposition: Home with Home Health   Recommendations for Outpatient Follow-up:  1. Follow up with PCP in 1-2 weeks 2. Follow up with General Surgery as an outpatient in 1-2 weeks 3. Follow-up with Interventional Radiology for the drain clinic in 1 to 2 weeks CT scan once drain is less than 10 cc over 24 hours. 4. Follow up with ID within 1-2 weeks 5. Follow-up with orthopedic surgery in outpatient setting for evaluation of left ankle 6. Follow up with Cardiology as an outpatient  7. Please obtain CMP/CBC, Mag, Phos in one week 8. 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  Subsequently was also found to have a 6 g drop in his hemoglobin with heme positive stools on presentation.  Further work-up also revealed the patient had a renal abscess status post drain placement with cultures growing out ESBL E. coli; patient underwent perirectal abscess I&D on 05/16/2020.  Further work-up showed that he had a right renal subscapular fluid collection and renal abscess.  ID was consulted and recommending IV meropenem given that the drain placement was  positive for ESBL E. coli.  On IV meropenem today and transitioning to ertapenem 1 g daily tomorrow anticipation for discharge and recommending PICC line placement. Palliative Care met with the patient and recommending DNR/DNI but patient will still focus on quality of life and we will continue to treat the treatable conditions.  Patient had a PICC line placed yesterday and he was scheduled be discharged but he was extremely groggy overnight and little confused so we will observe another night.  He was complaining about some left ankle pain and thinks he may twisted so obtain x-ray as well. X-Ray did not show any fractures. He is much improved and stable to D/C today as his mentation is back to baseline and his pain is stable. He is to follow up with all specialist as he is stable be discharged.  He will be going home with a PICC line and following up with ID as an outpatient and general surgery.  Discharge Diagnoses:  Principal Problem:   Perirectal abscess Active Problems:   Hypertensive heart disease   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)   Gastro-esophageal reflux disease without esophagitis   Long term (current) use of anticoagulants   Persistent atrial fibrillation (HCC)   CKD (chronic kidney disease), stage III (HCC)   S/P TAVR (transcatheter aortic valve replacement)   OSA on CPAP   CAD (coronary artery disease), native coronary artery   Atrial fibrillation, persistent (HCC)   Pyelonephritis of right kidney   Presence of IVC filter   Constipation, chronic   Cysts of right kidney   Renal abscess  Perirectal Abscesssecondary to ESBL E. coli status  post I&D 4/10 -Noted on CT abdomen and pelvis. -General surgery consulted and patient underwent incision and drainage (4/10) per general surgery. -Cultures from incision and drainage with abundant ESBL E. coli sensitive to Zosyn, Cipro, gentamicin, imipenem, Bactrim and resistant to Unasyn, ampicillin,  cefazolin, ceftazidime, Rocephin with intermediate sensitivity to cefepime. -Due to concurrent renal abscess, ID consulted and IV antibiotics changed to IV Merrem and will change to IV Ertapenem today as PICC is being placed. Patient was a little more confused and drowsy today so will hold D/C and observe overnight  -Continue sitz bath's as recommended per general surgery.  -General surgery was following but have signed off.  -Outpatient follow-up with general surgery when Discharged  Right renal subcapsular fluid collection/renal abscess/ Persistent pyelonephritis/nonobstructing nephrolithiasis. -Patient noted to have a right renal subcapsular fluid collection/renal abscess, -Patient noted to have had a UTI approximately a month ago placed on Bactrim later reevaluated in the ED placed on cefpodoxime. Urine cultures with no growth. -Cultures from this admission negative. -Unable to perform MRI secondary to pacemaker. -Dr Wyline Copas discussed with urology who recommended drainage per IR. -Status post drain placement per IR with purulent fluid noted and cultures positive for ESBL E. coli.  -Per IR once output from drainage < 10cc, will need repeat CT scan. -Repeat CT abdomen and pelvis ordered and done(05/24/2020)with partial decompression of right perinephric collection post percutaneous drain catheter placement, enlarging 2 cm 1.6 cm lower pole collections may represent developing small perinephric abscesses, right nephrolithiasis with possible pyelonephrosis, cholelithiasis. -There was concern on 05/22/2020, about drain insertion site concerning for infection however assessed insertion site of drain myself which is nonerythematous, no warmth, no significant tenderness to palpation. -IR recommendedurology consultation/input during this hospitalization and as such urology saw patient in consultation and recommended continue drain per IR, antibiotic recommendations per ID, no other intervention  necessary at this time, outpatient follow-up with primary urologist, Dr.Puchinsky post discharge. -Continue IV Merrem for ESBL per ID recommendations but will be changed to IV Ertapenem in the AM with PICC line placed in anticipation of D/C; Will hold D/C today given his lethargy and re-evaluate and D/C in the AM  -Will await ID and IR recommendations pending the evaluation of repeat CT. The CT scan on 4/19 and Dr. Annamaria Boots recommended no further intervention needed at this time -Interventional Radiology recommending continuing drain flushing 3 times daily and output recording every shift and dressing changes as needed and they will consider additional imaging when output less than 10 mL for 24 hours and if patient is to be discharged with a drain placed he will have a drain clinic appointment -ID following and appreciate input and recommendations.  Patient to be changed to ertapenem tomorrow to 1 g daily in anticipation for discharge and infectious diseases are opting for IV therapy in the setting of undrained collections and history of Bactrim allergy; they are recommending a plan for 3 weeks more therapy but final duration to be determined on drain management -WBC has been fluctuating and ranging  -OPAT orders have been placed and patient stable to D/C   Normocytic Anemia with Associated melena -Concern for acute GI bleed in the setting of Coumadin use. -Patient noted to be followed closely at [facility] for GI for history of hemorrhoids in the past. -Hemoglobin noted to have gone down to as low as 9.7 from 16.2 (04/27/2020). -Hemoglobin/Hct stable at 10.3/32.8 yesterday and today it is 1.6/36.8 -Patient with no overt bleeding. -Stools noted to be heme positive. -Dr.  Wyline Copas discussed with GI on-call, and given recent surgical drainage of perirectal abscess recommendation is to hold off on inpatient endoscopy for now and follow-up in the outpatient setting when more stable. -Coumadin has been resumed and INR  is therapeutic at 2.7  History of persistent atrial fibrillation -CHA2DSVASC score 5 -Coumadin was held initially during the hospitalization, due to concerns for anemia/melena.  -Coumadin resumed.  -INR at 2.7 today .  -Hemoglobin stable at 10.2 -Coumadin per pharmacy dosing  Acquired Thrombophilia -In the setting of A Fib and recurrent DVT/PE  History of recurrent DVT/PE with supratherapeutic INR -Patient noted to have a history of provoked DVT following prostatectomy status post IVC placement afterwards. -Patient noted to have developed bacteremia and PE and DVT and subsequently placed on warfarin. -Patient's PE and DVT noted to be remote. -Coumadin had held initially due to concern for dark stools .  -Coumadin resumed.  -INR at 2.7 today .  -Hemoglobin stable at 10.2 -Coumadin per pharmacy. C/w 2.5 mg at D/C  History of heart block status post PPM -Continue Outpatient follow-up with cardiology.   History of aortic stenosis status post TAVR -Appears to be a bioprosthetic valve. -INR as above at 2.7 -Outpatient follow-up with PCP and Coumadin Clinic   Coronary artery disease status post stent -Initially Lasix and Norvasc held due to borderline blood pressure. -Continued Imdur, pravastatin 20 mg p.o. daily -Resumed Furosemide 20 mg po Daily and will continue at dischargeand resume Amlodipine at D/C  OSA -Patient noted to be refusing CPAP at bedtime and has been changed to as needed.  Chronic kidney disease stage IIIa -Patient's BUNs/creatinine has been relatively stable and actually improved from earlier on in the admission next-patient's BUN/creatinine went from 20/1.38 -> 18/1.04 -> 18/1.18 -> 16/1.09 -Avoid further nephrotoxic medications, contrast dyes, hypotension and renally dose medications -Repeat CMP in a.m.  Borderline blood pressure -Patient noted to have borderline blood pressure 05/21/2020.  -Continued Imdur. Resume home regimen  Lasix. -Continue to hold Norvasc but will resume at D/C  -Continue to monitor blood pressures per protocol -Last blood pressure reading was133/70  Constipation -Resolved on current bowel regimen.  -Had a bowel movement the day before yesterday but not yesterday and thinks he will have a bowel movement today -Polyethylene glycol 34 g p.o. twice daily and will change to 17 gram BID  Left Ankle Pain -Unclear if he has had any trauma but he thinks he may have twisted it -Obtain x-ray of the left ankle and showed "Diffuse osteopenia and degenerative change. Small bony densities noted adjacent to the medial and lateral malleoli as well as over the dorsal talus. These may represent small fracture fragments, possibly old.  Peripheral vascular disease." -Continue PT/OT to evaluate and treat  Nonobstructing nephrolithiasis -Urology evaluated with no further inpatient recommendations and advised follow-up with primary urologist  GERD  -Continue with PPI with pantoprazole 40 g p.o. daily  Overweight -Estimated body mass index is 29.02 kg/m as calculated from the following:   Height as of this encounter: 6' (1.829 m).   Weight as of this encounter: 97.1 kg. -Weight Loss and Dietary Counseling given  Discharge Instructions  Discharge Instructions    [facility] pharmacist to adjust dose for Vancomycin, Aminoglycosides and other anti-infective therapies as requested by physician.   Complete by: As directed    [facility] to provide Cath Flo 14m   Complete by: As directed    Administer for PICC line occlusion and as ordered by physician  for other access device issues.   Anaphylaxis Kit: Provided to treat any anaphylactic reaction to the medication being provided to the patient if First Dose or when requested by physician   Complete by: As directed    Epinephrine 68m/ml vial / amp: Administer 0.39m(0.65m34msubcutaneously once for moderate to severe anaphylaxis, nurse  to call physician and pharmacy when reaction occurs and call 911 if needed for immediate care   Diphenhydramine 54m64m IV vial: Administer 25-54mg32mIM PRN for first dose reaction, rash, itching, mild reaction, nurse to call physician and pharmacy when reaction occurs   Sodium Chloride 0.9% NS 500ml 48mAdminister if needed for hypovolemic blood pressure drop or as ordered by physician after call to physician with anaphylactic reaction   Call MD for:  difficulty breathing, headache or visual disturbances   Complete by: As directed    Call MD for:  extreme fatigue   Complete by: As directed    Call MD for:  hives   Complete by: As directed    Call MD for:  persistant dizziness or light-headedness   Complete by: As directed    Call MD for:  persistant nausea and vomiting   Complete by: As directed    Call MD for:  redness, tenderness, or signs of infection (pain, swelling, redness, odor or green/yellow discharge around incision site)   Complete by: As directed    Call MD for:  severe uncontrolled pain   Complete by: As directed    Call MD for:  temperature >100.4   Complete by: As directed    Change dressing on IV access line weekly and PRN   Complete by: As directed    Diet - low sodium heart healthy   Complete by: As directed    Discharge instructions   Complete by: As directed    You were cared for by a hospitalist during your hospital stay. If you have any questions about your discharge medications or the care you received while you were in the hospital after you are discharged, you can call the unit and ask to speak with the hospitalist on call if the hospitalist that took care of you is not available. Once you are discharged, your primary care physician will handle any further medical issues. Please note that NO REFILLS for any discharge medications will be authorized once you are discharged, as it is imperative that you return to your primary care physician (or establish a  relationship with a primary care physician if you do not have one) for your aftercare needs so that they can reassess your need for medications and monitor your lab values.  Follow up with PCP, General Surgery, Urology, and ID as an outpatient. Take all medications as prescribed. If symptoms change or worsen please return to the ED for evaluation   Discharge wound care:   Complete by: As directed    Per surgery Reccs   Flush IV access with Sodium Chloride 0.9% and Heparin 10 units/ml or 100 units/ml   Complete by: As directed    Home infusion instructions - [facility]   Complete by: As directed    Instructions: Flush IV access with Sodium Chloride 0.9% and Heparin 10units/ml or 100units/ml   Change dressing on IV access line: Weekly and PRN   Instructions Cath Flo 2mg: A265mnister for PICC Line occlusion and as ordered by physician for other access device   [facility] pharmacist to adjust dose for: Vancomycin, Aminoglycosides and other anti-infective therapies  as requested by physician   Increase activity slowly   Complete by: As directed    Method of administration may be changed at the discretion of home infusion pharmacist based upon assessment of the patient and/or caregiver's ability to self-administer the medication ordered   Complete by: As directed    Outpatient Parenteral Antibiotic Therapy Information Antibiotic: Ertapenem (Invanz) IVPB; Indications for use: ESBL E Coli Bacteremia; End Date: 06/17/2020   Complete by: As directed    Antibiotic: Ertapenem Colbert Ewing) IVPB   Indications for use: ESBL E Coli Bacteremia   End Date: 06/17/2020     Allergies as of 05/28/2020      Reactions   Darifenacin Nausea Only, Other (See Comments)   dry mouth and dizziness ENABLEX Dizziness Pt denies   Codeine Other (See Comments)   Sulfamethoxazole-trimethoprim Other (See Comments)   Lisinopril Cough   Pt denies      Medication List    STOP taking these medications    cefpodoxime 200 MG tablet Commonly known as: VANTIN   omeprazole 20 MG capsule Commonly known as: PRILOSEC     TAKE these medications   acetaminophen 500 MG tablet Commonly known as: TYLENOL Take 500 mg by mouth at bedtime.   amLODipine 2.5 MG tablet Commonly known as: NORVASC Take 1 tablet (2.5 mg total) by mouth daily.   Biotin 2500 MCG Caps Take 2,500 mcg by mouth daily.   cholecalciferol 1000 units tablet Commonly known as: VITAMIN D Take 1,000 Units by mouth daily.   docusate sodium 50 MG capsule Commonly known as: COLACE Take 50 mg by mouth daily.   ertapenem  IVPB Commonly known as: INVANZ Inject 1 g into the vein daily for 22 days. Indication:  ESBL renal abscess  First Dose: No Last Day of Therapy:  06/17/2020 Labs - Once weekly:  CBC/D and BMP, Labs - Every other week:  ESR and CRP Method of administration: Mini-Bag Plus / Gravity Method of administration may be changed at the discretion of home infusion pharmacist based upon assessment of the patient and/or caregiver's ability to self-administer the medication ordered.   Fish Oil 1000 MG Caps Take 1,000 mg by mouth daily.   furosemide 20 MG tablet Commonly known as: LASIX Take 20 mg by mouth daily.   GLUCOSAMINE PO Take 2,000 mg by mouth daily.   HYDROcodone-acetaminophen 5-325 MG tablet Commonly known as: NORCO/VICODIN Take 1 tablet by mouth every 4 (four) hours as needed for severe pain.   isosorbide mononitrate 30 MG 24 hr tablet Commonly known as: IMDUR Take 0.5 tablets (15 mg total) by mouth daily.   multivitamin capsule Take 1 capsule by mouth daily.   nitroGLYCERIN 0.4 MG SL tablet Commonly known as: NITROSTAT Place 1 tablet (0.4 mg total) under the tongue every 5 (five) minutes as needed.   pantoprazole 40 MG tablet Commonly known as: PROTONIX Take 1 tablet (40 mg total) by mouth daily at 6 (six) AM. Start taking on: May 29, 2020   polyethylene glycol 17 g packet Commonly known  as: MIRALAX / GLYCOLAX Take 17 g by mouth 2 (two) times daily.   pravastatin 20 MG tablet Commonly known as: PRAVACHOL TAKE ONE TABLET BY MOUTH ONCE DAILY   vitamin B-12 500 MCG tablet Commonly known as: CYANOCOBALAMIN Take 500 mcg by mouth daily.   vitamin C 1000 MG tablet Take 1,000 mg by mouth daily.   warfarin 2.5 MG tablet Commonly known as: COUMADIN Take as directed. If you are unsure how to  take this medication, talk to your nurse or doctor. Original instructions: Take 1 tablet daily or as directed by Coumadin Clinic What changed:   how much to take  how to take this  when to take this  additional instructions            Discharge Care Instructions  (From admission, onward)         Start     Ordered   05/28/20 0000  Discharge wound care:       Comments: Per surgery Reccs   05/28/20 1016   05/27/20 0000  Change dressing on IV access line weekly and PRN  (Home infusion instructions - [facility] )        05/27/20 1406          Follow-up [facility] Go on 06/03/2020.   Specialty: General Surgery Why: Your appointment is 4/28 at 3pm Please arrive 30 minutes prior to your appointment to check in and fill out paperwork. Bring photo ID and insurance information. Contact information: [address] [place] [phone]       Criselda Peaches, MD Follow up.   Specialties: Interventional Radiology, Radiology Why: Outpatient follow up visit for CT scan and possible drain injection at the clinic. Our office will call you to set up the appointment in 1-2 week after discharg. Please call our clinic if you have any questions or concerns about the appointment.  Contact information: [place] [address] [phone]        [facility] [place] Follow up.   Specialty: Home Health Services Why: [facility], this is the agency that is  providing your in home services Contact information: [place] [address] [phone]              Allergies  Allergen Reactions  . Darifenacin Nausea Only and Other (See Comments)    dry mouth and dizziness ENABLEX Dizziness Pt denies  . Codeine Other (See Comments)  . Sulfamethoxazole-Trimethoprim Other (See Comments)  . Lisinopril Cough    Pt denies   Consultants:   Infectious Diseases  Interventional Radiology   Palliative Care Medicine   Urology  General Surgery  Case was discussed with gastroenterology  Procedures:   CT abdomen and pelvis 05/15/2020  Placement of 12 French percutaneous drainage catheter with aspiration of 135 mL of frankly pleural fluid from subscapular perinephric fluid collection-Per IR, Dr. Laurence Ferrari 05/18/2020  Incision and drainage of perirectal abscess per general surgery, Dr. Donne Hazel 05/16/2020  Procedures/Studies: DG Ankle Complete Left  Result Date: 05/27/2020 CLINICAL DATA:  Left ankle pain. EXAM: LEFT ANKLE COMPLETE - 3+ VIEW COMPARISON:  No recent. FINDINGS: Diffuse osteopenia. Diffuse degenerative change. Small bony densities noted adjacent to the medial and lateral malleoli as well as over the dorsal talus. These may represent small fracture fragments, possibly old. Surgical screw noted of foot. Peripheral vascular calcification. IMPRESSION: 1. Diffuse osteopenia and degenerative change. Small bony densities noted adjacent to the medial and lateral malleoli as well as over the dorsal talus. These may represent small fracture fragments, possibly old. 2.  Peripheral vascular disease. Electronically Signed   By: Marcello Moores  Register   On: 05/27/2020 12:56   CT ABDOMEN PELVIS W CONTRAST  Result Date: 05/25/2020 CLINICAL DATA:  Enlarging right subcapsular perinephric fluid collection, status post drain catheter placement 05/18/2020 EXAM: CT ABDOMEN AND PELVIS WITH CONTRAST TECHNIQUE:  Multidetector CT imaging of  the abdomen and pelvis was performed using the standard protocol following bolus administration of intravenous contrast. CONTRAST:  145m OMNIPAQUE IOHEXOL 300 MG/ML  SOLN COMPARISON:  05/15/2020 and previous FINDINGS: Lower chest: Trace pericardial effusions as before. Transvenous pacing leads partially visualized. Status post AVR. Trace pericardial effusion. Patchy subsegmental atelectasis/consolidation posteriorly in the lung bases. Hepatobiliary: Liver unremarkable. Several subcentimeter partially calcified stones in the dependent aspect of the nondilated gallbladder. Pancreas: Unremarkable. No pancreatic ductal dilatation or surrounding inflammatory changes. Spleen: Normal in size without focal abnormality. Adrenals/Urinary Tract: Adrenal glands unremarkable. 1.4 cm mid left renal cyst. Urinary bladder nondistended. On the right, interval placement of percutaneous drain catheter with partial decompression of the perinephric collection, residual component 4.5 x 1.8 cm. Right nephrolithiasis, largest stone 1.9 cm centrally in the collecting system, nonobstructive. High attenuation material in the distended renal collecting system. Ureters decompressed. Enlarging 2 cm and 1.6 cm collections adjacent to the lower pole may represent developing small perinephric abscesses. Regional inflammatory changes persist. Stomach/Bowel: Stomach and small bowel are nondilated. The colon is decompressed, unremarkable. Vascular/Lymphatic: Heavy extensive aortic and iliofemoral calcified atheromatous plaque without aneurysm or evident high-grade stenosis. No abdominal or pelvic adenopathy. Infrarenal IVC filter without suggestion of caval thrombosis. Reproductive: Previous prostatectomy. Other: No ascites.  No free air. Musculoskeletal: Multilevel lumbar spondylitic change. Negative for fracture or worrisome bone lesion. For IMPRESSION: 1. Partial decompression of right perinephric collection post percutaneous drain catheter  placement. 2. Enlarging 2 cm and 1.6 cm lower pole collections may represent developing small perinephric abscesses. 3. Right nephrolithiasis with possible pyonephrosis 4. Cholelithiasis. Aortic Atherosclerosis (ICD10-I70.0). Electronically Signed   By: DLucrezia EuropeM.D.   On: 05/25/2020 08:23   CT Abdomen Pelvis W Contrast  Result Date: 05/15/2020 CLINICAL DATA:  Painful bowel movement with rectal bleeding. EXAM: CT ABDOMEN AND PELVIS WITH CONTRAST TECHNIQUE: Multidetector CT imaging of the abdomen and pelvis was performed using the standard protocol following bolus administration of intravenous contrast. CONTRAST:  859mOMNIPAQUE IOHEXOL 300 MG/ML  SOLN COMPARISON:  April 22, 2020 FINDINGS: Lower chest: Mild areas of atelectasis are seen within the bilateral lung bases. There is an artificial aortic valve. A small pericardial effusion is noted. Hepatobiliary: No focal liver abnormality is seen. Subcentimeter gallstones are seen within the lumen of an otherwise normal-appearing gallbladder. Pancreas: Unremarkable. No pancreatic ductal dilatation or surrounding inflammatory changes. Spleen: Normal in size without focal abnormality. Adrenals/Urinary Tract: Adrenal glands are unremarkable. A 1.5 cm x 1.3 cm cyst is seen within the lateral aspect of the mid left kidney. Renal cortical thinning is again seen on the right. A 7.1 cm x 4.7 cm lobulated area of low attenuation (approximately 22.12 Hounsfield units) is seen within the anterior aspect of the right kidney. This is seen as an area of subcapsular fluid on the prior study and is increased in size. Additional areas of lobulated low-attenuation are noted within the mid and upper right kidney. This is present on the prior study. A 17 mm nonobstructing renal stone is again seen within the lower pole of the right kidney. Additional smaller renal versus vascular calcifications are noted within the mid right kidney. Mild right-sided perinephric inflammatory fat  stranding is seen. A mild amount of contrast is seen within the dependent portion of a normal appearing urinary bladder. Stomach/Bowel: Stomach is within normal limits. The appendix is not identified. No evidence of bowel dilatation. Vascular/Lymphatic: Aortic atherosclerosis. And inferior vena cava filter is noted. No enlarged abdominal  or pelvic lymph nodes. Reproductive: The prostate gland is surgically absent. Other: A 4.7 cm x 3.1 cm collection of fluid and air, with thin surrounding mildly hyperdense rim, is seen within the posterior perirectal region on the left. This represents a new finding. Musculoskeletal: Multilevel degenerative changes seen throughout the lumbar spine. IMPRESSION: 1. 4.7 cm x 3.1 cm left posterior perirectal abscess. 2. Cholelithiasis. 3. Interval increase in size of the suspected right subcapsular fluid collection since the prior exam with additional findings suggestive of right-sided acute pyelonephritis. MRI correlation is recommended. 4. Nonobstructing renal calculus within the right kidney. 5. Aortic atherosclerosis. Aortic Atherosclerosis (ICD10-I70.0). Electronically Signed   By: Virgina Norfolk M.D.   On: 05/15/2020 22:46   DG CHEST PORT 1 VIEW  Result Date: 05/27/2020 CLINICAL DATA:  PICC line placement. EXAM: PORTABLE CHEST 1 VIEW COMPARISON:  04/22/2020. FINDINGS: PICC line noted with tip over SVC. AICD noted stable position. Prior cardiac valve replacement. Stable cardiomegaly. No pulmonary venous congestion. Mild right infrahilar atelectasis/infiltrate cannot be excluded. Stable left base pleural thickening consistent scarring. No pleural effusion. No pneumothorax. Prominent degenerative changes both shoulders. IMPRESSION: 1.  Right PICC line noted with tip over SVC. 2. AICD in stable position. Prior cardiac valve replacement. Stable cardiomegaly. No pulmonary venous congestion. 3.  Mild right infrahilar atelectasis/infiltrate cannot be excluded. Electronically Signed    By: Marcello Moores  Register   On: 05/27/2020 11:27   CT IMAGE GUIDED DRAINAGE BY PERCUTANEOUS CATHETER  Result Date: 05/18/2020 INDICATION: Enlarging subcapsular perinephric fluid collection concerning for abscess. EXAM: CT-guided drain placement MEDICATIONS: The patient is currently admitted to the hospital and receiving intravenous antibiotics. The antibiotics were administered within an appropriate time frame prior to the initiation of the procedure. ANESTHESIA/SEDATION: Fentanyl 50 mcg IV; Versed 1 mg IV Moderate Sedation Time:  12 minutes The patient was continuously monitored during the procedure by the interventional radiology nurse under my direct supervision. COMPLICATIONS: None immediate. PROCEDURE: Informed written consent was obtained from the patient after a thorough discussion of the procedural risks, benefits and alternatives. All questions were addressed. Maximal Sterile Barrier Technique was utilized including caps, mask, sterile gowns, sterile gloves, sterile drape, hand hygiene and skin antiseptic. A timeout was performed prior to the initiation of the procedure. Planning CT scan of the abdomen was performed. Large subcapsular fluid collection identified in the anterolateral aspect of the right kidney. The patient was repositioned into the left lateral decubitus position. A suitable skin entry site was selected and marked. The overlying skin was sterilely prepped and draped in the standard fashion with chlorhexidine skin prep. Local anesthesia was attained by infiltration with 1% lidocaine. A small dermatotomy was made. Under intermittent CT guidance, an 18 gauge trocar needle was advanced into the fluid collection. A 0.035 wire was then coiled in the collection. The needle was removed. The skin tract was dilated to 12 [place]. A Cook 12 [place] all-purpose drainage catheter was advanced over the wire and formed. Aspiration yields 135 mL thick, purulent fluid. The catheter was gently flushed with  saline and connected to JP bulb suction before being secured to the skin with 0 Prolene suture. Follow-up CT imaging demonstrates a well-positioned drainage catheter and significant decompression of the subcapsular perinephric fluid collection. IMPRESSION: Successful placement of 12 French percutaneous drainage catheter with aspiration of 135 mL frankly purulent fluid from the subcapsular perinephric fluid collection. Electronically Signed   By: Jacqulynn Cadet M.D.   On: 05/18/2020 12:46   [place] EKG SITE RITE  Result  Date: 05/26/2020 If Site Rite image not attached, placement could not be confirmed due to current cardiac rhythm.    Subjective: Seen and examined at bedside and he is doing much better today than he was yesterday and he is much more awake.  Still complains of some left ankle pain but not as bad.  Feels better and ready to go home.  No other concerns or complaints at this time a PICC line was placed yesterday in the right arm.  He is stable be discharged and has outpatient follow-up with infectious diseases as well as general surgery in outpatient setting.   Discharge Exam: Vitals:   05/27/20 2031 05/28/20 0430  BP: 138/65 133/70  Pulse: 60 61  Resp: 20 20  Temp: 98.3 F (36.8 C) 98 F (36.7 C)  SpO2: 95% 98%   Vitals:   05/27/20 0831 05/27/20 1416 05/27/20 2031 05/28/20 0430  BP: (!) 115/52 137/61 138/65 133/70  Pulse: 66 66 60 61  Resp:   20 20  Temp: 98.4 F (36.9 C) 100 F (37.8 C) 98.3 F (36.8 C) 98 F (36.7 C)  TempSrc: Axillary Axillary Oral Oral  SpO2: 95% 96% 95% 98%  Weight:      Height:       General: Pt is alert, awake, not in acute distress Cardiovascular: Slightly irregular irregular, S1/S2 +, no rubs, no gallops Respiratory: Diminished bilaterally, no wheezing, no rhonchi; unlabored breathing not wearing any supplemental oxygen via nasal cannula Abdominal: Soft, NT, distended secondary body habitus, bowel sounds + Extremities: no edema, no  cyanosis; has a right arm PICC  The results of significant diagnostics from this hospitalization (including imaging, microbiology, ancillary and laboratory) are listed below for reference.    Microbiology: No results found for this or any previous visit (from the past 240 hour(s)).   Labs: BNP (last 3 results) No results for input(s): BNP in the last 8760 hours. Basic Metabolic Panel: Recent Labs  Lab 05/24/20 0829 05/25/20 0512 05/26/20 0408 05/27/20 0348 05/28/20 0450  NA 137 137 135 134* 139  K 4.3 4.2 3.9 4.2 4.2  CL 102 104 102 101 104  CO2 '27 26 26 27 28  ' GLUCOSE 182* 113* 113* 124* 133*  BUN '20 15 18 18 16  ' CREATININE 1.17 0.99 1.04 1.18 1.09  CALCIUM 9.7 9.4 9.2 9.5 9.6  MG  --   --   --  2.0 1.9  PHOS  --   --   --  2.7 3.0   Liver Function Tests: Recent Labs  Lab 05/27/20 0348 05/28/20 0450  AST 21 17  ALT 8 7  ALKPHOS 77 71  BILITOT 1.0 1.0  PROT 6.7 5.9*  ALBUMIN 2.5* 2.1*   No results for input(s): LIPASE, AMYLASE in the last 168 hours. Recent Labs  Lab 05/23/20 0711  AMMONIA 18   CBC: Recent Labs  Lab 05/24/20 0829 05/25/20 0512 05/26/20 0408 05/27/20 0348 05/28/20 0450  WBC 10.1 10.8* 9.7 10.7* 7.2  NEUTROABS 7.9* 8.1* 7.0 8.0* 5.4  HGB 11.3* 11.0* 10.3* 11.6* 10.2*  HCT 35.2* 34.5* 32.8* 36.8* 32.4*  MCV 80.5 80.4 80.0 81.1 80.0  PLT 319 355 322 348 274   Cardiac Enzymes: No results for input(s): CKTOTAL, CKMB, CKMBINDEX, TROPONINI in the last 168 hours. BNP: Invalid input(s): POCBNP CBG: No results for input(s): GLUCAP in the last 168 hours. D-Dimer No results for input(s): DDIMER in the last 72 hours. Hgb A1c No results for input(s): HGBA1C in the last 72  hours. Lipid Profile No results for input(s): CHOL, HDL, LDLCALC, TRIG, CHOLHDL, LDLDIRECT in the last 72 hours. Thyroid function studies No results for input(s): TSH, T4TOTAL, T3FREE, THYROIDAB in the last 72 hours.  Invalid input(s): FREET3 Anemia work up No results  for input(s): VITAMINB12, FOLATE, FERRITIN, TIBC, IRON, RETICCTPCT in the last 72 hours. Urinalysis    Component Value Date/Time   COLORURINE YELLOW 05/15/2020 2307   APPEARANCEUR CLEAR 05/15/2020 2307   LABSPEC 1.033 (H) 05/15/2020 2307   PHURINE 7.0 05/15/2020 2307   GLUCOSEU NEGATIVE 05/15/2020 2307   HGBUR NEGATIVE 05/15/2020 2307   BILIRUBINUR NEGATIVE 05/15/2020 2307   KETONESUR NEGATIVE 05/15/2020 2307   PROTEINUR NEGATIVE 05/15/2020 2307   UROBILINOGEN 0.2 09/19/2014 1027   NITRITE NEGATIVE 05/15/2020 2307   LEUKOCYTESUR NEGATIVE 05/15/2020 2307   Sepsis Labs Invalid input(s): PROCALCITONIN,  WBC,  LACTICIDVEN Microbiology No results found for this or any previous visit (from the past 240 hour(s)).  Time coordinating discharge: 35 minutes  SIGNED:  Kerney Elbe, DO [facility] 05/28/2020, 6:08 PM Pager is on [place]  If 7PM-7AM, please contact night-coverage [email or web address]

## 2020-05-29 DIAGNOSIS — N119 Chronic tubulo-interstitial nephritis, unspecified: Secondary | ICD-10-CM | POA: Diagnosis not present

## 2020-05-29 DIAGNOSIS — I4821 Permanent atrial fibrillation: Secondary | ICD-10-CM | POA: Diagnosis not present

## 2020-05-29 DIAGNOSIS — I130 Hypertensive heart and chronic kidney disease with heart failure and stage 1 through stage 4 chronic kidney disease, or unspecified chronic kidney disease: Secondary | ICD-10-CM | POA: Diagnosis not present

## 2020-05-29 DIAGNOSIS — K611 Rectal abscess: Secondary | ICD-10-CM | POA: Diagnosis not present

## 2020-05-29 DIAGNOSIS — I2510 Atherosclerotic heart disease of native coronary artery without angina pectoris: Secondary | ICD-10-CM | POA: Diagnosis not present

## 2020-05-29 DIAGNOSIS — N200 Calculus of kidney: Secondary | ICD-10-CM | POA: Diagnosis not present

## 2020-05-31 ENCOUNTER — Other Ambulatory Visit: Payer: Self-pay | Admitting: General Surgery

## 2020-05-31 DIAGNOSIS — N151 Renal and perinephric abscess: Secondary | ICD-10-CM

## 2020-05-31 DIAGNOSIS — I4821 Permanent atrial fibrillation: Secondary | ICD-10-CM | POA: Diagnosis not present

## 2020-05-31 DIAGNOSIS — I130 Hypertensive heart and chronic kidney disease with heart failure and stage 1 through stage 4 chronic kidney disease, or unspecified chronic kidney disease: Secondary | ICD-10-CM | POA: Diagnosis not present

## 2020-05-31 DIAGNOSIS — N119 Chronic tubulo-interstitial nephritis, unspecified: Secondary | ICD-10-CM | POA: Diagnosis not present

## 2020-05-31 DIAGNOSIS — K611 Rectal abscess: Secondary | ICD-10-CM | POA: Diagnosis not present

## 2020-05-31 DIAGNOSIS — N200 Calculus of kidney: Secondary | ICD-10-CM | POA: Diagnosis not present

## 2020-05-31 DIAGNOSIS — I2510 Atherosclerotic heart disease of native coronary artery without angina pectoris: Secondary | ICD-10-CM | POA: Diagnosis not present

## 2020-06-01 ENCOUNTER — Telehealth: Payer: Self-pay | Admitting: Cardiology

## 2020-06-01 NOTE — Telephone Encounter (Signed)
Called [facility] verified pt is receiving [place] services through them at the present.  Pt is receiving PT, OT and nursing services at present.  I was transferred to voice mail for Enzo Bi, [facility] for verbal INR orders at next visit.  Returned call to [place], pt's wife advised we have attempted to contact [facility] RN to give verbal orders to check INR while in pt's home.  Will await call back from [facility] case manager to give orders. To wife's knowledge [place] RN has not been out today yet.

## 2020-06-01 NOTE — Telephone Encounter (Signed)
[name] is clling in with questions about her husband's missed coumadin appt.She states that he was hospitalized and couldn't make it. She wants to know is it possible for someone to come out to there home to check it .Please advise

## 2020-06-02 ENCOUNTER — Telehealth: Payer: Self-pay

## 2020-06-02 ENCOUNTER — Ambulatory Visit (INDEPENDENT_AMBULATORY_CARE_PROVIDER_SITE_OTHER): Payer: Medicare Other | Admitting: Interventional Cardiology

## 2020-06-02 DIAGNOSIS — Z86718 Personal history of other venous thrombosis and embolism: Secondary | ICD-10-CM | POA: Diagnosis not present

## 2020-06-02 DIAGNOSIS — N200 Calculus of kidney: Secondary | ICD-10-CM | POA: Diagnosis not present

## 2020-06-02 DIAGNOSIS — K611 Rectal abscess: Secondary | ICD-10-CM | POA: Diagnosis not present

## 2020-06-02 DIAGNOSIS — I2510 Atherosclerotic heart disease of native coronary artery without angina pectoris: Secondary | ICD-10-CM | POA: Diagnosis not present

## 2020-06-02 DIAGNOSIS — I4819 Other persistent atrial fibrillation: Secondary | ICD-10-CM | POA: Diagnosis not present

## 2020-06-02 DIAGNOSIS — I130 Hypertensive heart and chronic kidney disease with heart failure and stage 1 through stage 4 chronic kidney disease, or unspecified chronic kidney disease: Secondary | ICD-10-CM | POA: Diagnosis not present

## 2020-06-02 DIAGNOSIS — I4821 Permanent atrial fibrillation: Secondary | ICD-10-CM | POA: Diagnosis not present

## 2020-06-02 DIAGNOSIS — N119 Chronic tubulo-interstitial nephritis, unspecified: Secondary | ICD-10-CM | POA: Diagnosis not present

## 2020-06-02 LAB — POCT INR: INR: 5.8 — AB (ref 2.0–3.0)

## 2020-06-02 MED ORDER — FUROSEMIDE 20 MG PO TABS: 20.0000 mg | ORAL_TABLET | Freq: Every day | ORAL | 2 refills | Status: DC

## 2020-06-02 NOTE — Telephone Encounter (Signed)
Refill of Furosemide 20 mg sent to [facility].

## 2020-06-02 NOTE — Telephone Encounter (Signed)
[facility] called [phone]) saying that they were going out to pt's house today and would check INR.

## 2020-06-02 NOTE — Patient Instructions (Signed)
Description    Called and spoke to [place], instructed for pt to hold warfarin 4/27, 4/28 and 4/29, and then start taking warfarin 1 tablet daily except for 1/2 a tablet on Wednesdays. Recheck INR on 5/2 by home health. [facility] [phone].

## 2020-06-03 DIAGNOSIS — N200 Calculus of kidney: Secondary | ICD-10-CM | POA: Diagnosis not present

## 2020-06-03 DIAGNOSIS — N119 Chronic tubulo-interstitial nephritis, unspecified: Secondary | ICD-10-CM | POA: Diagnosis not present

## 2020-06-03 DIAGNOSIS — I2510 Atherosclerotic heart disease of native coronary artery without angina pectoris: Secondary | ICD-10-CM | POA: Diagnosis not present

## 2020-06-03 DIAGNOSIS — K611 Rectal abscess: Secondary | ICD-10-CM | POA: Diagnosis not present

## 2020-06-03 DIAGNOSIS — I130 Hypertensive heart and chronic kidney disease with heart failure and stage 1 through stage 4 chronic kidney disease, or unspecified chronic kidney disease: Secondary | ICD-10-CM | POA: Diagnosis not present

## 2020-06-03 DIAGNOSIS — I4821 Permanent atrial fibrillation: Secondary | ICD-10-CM | POA: Diagnosis not present

## 2020-06-04 ENCOUNTER — Ambulatory Visit: Payer: Medicare Other | Admitting: Internal Medicine

## 2020-06-04 DIAGNOSIS — K611 Rectal abscess: Secondary | ICD-10-CM | POA: Diagnosis not present

## 2020-06-04 DIAGNOSIS — N119 Chronic tubulo-interstitial nephritis, unspecified: Secondary | ICD-10-CM | POA: Diagnosis not present

## 2020-06-04 DIAGNOSIS — I2510 Atherosclerotic heart disease of native coronary artery without angina pectoris: Secondary | ICD-10-CM | POA: Diagnosis not present

## 2020-06-04 DIAGNOSIS — I4821 Permanent atrial fibrillation: Secondary | ICD-10-CM | POA: Diagnosis not present

## 2020-06-04 DIAGNOSIS — N200 Calculus of kidney: Secondary | ICD-10-CM | POA: Diagnosis not present

## 2020-06-04 DIAGNOSIS — I130 Hypertensive heart and chronic kidney disease with heart failure and stage 1 through stage 4 chronic kidney disease, or unspecified chronic kidney disease: Secondary | ICD-10-CM | POA: Diagnosis not present

## 2020-06-07 ENCOUNTER — Telehealth: Payer: Self-pay

## 2020-06-07 ENCOUNTER — Encounter (HOSPITAL_COMMUNITY): Payer: Self-pay

## 2020-06-07 ENCOUNTER — Emergency Department (HOSPITAL_COMMUNITY)
Admission: EM | Admit: 2020-06-07 | Discharge: 2020-06-07 | Disposition: A | Discharge status: Home / Self Care | Payer: Medicare Other | Attending: Emergency Medicine | Admitting: Emergency Medicine

## 2020-06-07 ENCOUNTER — Emergency Department (HOSPITAL_COMMUNITY): Payer: Medicare Other

## 2020-06-07 ENCOUNTER — Ambulatory Visit (INDEPENDENT_AMBULATORY_CARE_PROVIDER_SITE_OTHER): Payer: Medicare Other | Admitting: Pharmacist

## 2020-06-07 ENCOUNTER — Other Ambulatory Visit: Payer: Self-pay

## 2020-06-07 DIAGNOSIS — J45909 Unspecified asthma, uncomplicated: Secondary | ICD-10-CM | POA: Diagnosis not present

## 2020-06-07 DIAGNOSIS — I130 Hypertensive heart and chronic kidney disease with heart failure and stage 1 through stage 4 chronic kidney disease, or unspecified chronic kidney disease: Secondary | ICD-10-CM | POA: Diagnosis not present

## 2020-06-07 DIAGNOSIS — Z8546 Personal history of malignant neoplasm of prostate: Secondary | ICD-10-CM | POA: Diagnosis not present

## 2020-06-07 DIAGNOSIS — I4821 Permanent atrial fibrillation: Secondary | ICD-10-CM | POA: Diagnosis not present

## 2020-06-07 DIAGNOSIS — Z86718 Personal history of other venous thrombosis and embolism: Secondary | ICD-10-CM | POA: Diagnosis not present

## 2020-06-07 DIAGNOSIS — Z95828 Presence of other vascular implants and grafts: Secondary | ICD-10-CM

## 2020-06-07 DIAGNOSIS — Z87891 Personal history of nicotine dependence: Secondary | ICD-10-CM | POA: Diagnosis not present

## 2020-06-07 DIAGNOSIS — Z7901 Long term (current) use of anticoagulants: Secondary | ICD-10-CM | POA: Diagnosis not present

## 2020-06-07 DIAGNOSIS — N119 Chronic tubulo-interstitial nephritis, unspecified: Secondary | ICD-10-CM | POA: Diagnosis not present

## 2020-06-07 DIAGNOSIS — Z959 Presence of cardiac and vascular implant and graft, unspecified: Secondary | ICD-10-CM | POA: Diagnosis not present

## 2020-06-07 DIAGNOSIS — Z96653 Presence of artificial knee joint, bilateral: Secondary | ICD-10-CM | POA: Insufficient documentation

## 2020-06-07 DIAGNOSIS — Z452 Encounter for adjustment and management of vascular access device: Secondary | ICD-10-CM | POA: Insufficient documentation

## 2020-06-07 DIAGNOSIS — I5042 Chronic combined systolic (congestive) and diastolic (congestive) heart failure: Secondary | ICD-10-CM | POA: Insufficient documentation

## 2020-06-07 DIAGNOSIS — I4891 Unspecified atrial fibrillation: Secondary | ICD-10-CM | POA: Diagnosis not present

## 2020-06-07 DIAGNOSIS — I4819 Other persistent atrial fibrillation: Secondary | ICD-10-CM

## 2020-06-07 DIAGNOSIS — I2510 Atherosclerotic heart disease of native coronary artery without angina pectoris: Secondary | ICD-10-CM | POA: Diagnosis not present

## 2020-06-07 DIAGNOSIS — K611 Rectal abscess: Secondary | ICD-10-CM | POA: Diagnosis not present

## 2020-06-07 DIAGNOSIS — N183 Chronic kidney disease, stage 3 unspecified: Secondary | ICD-10-CM | POA: Diagnosis not present

## 2020-06-07 DIAGNOSIS — Z79899 Other long term (current) drug therapy: Secondary | ICD-10-CM | POA: Diagnosis not present

## 2020-06-07 DIAGNOSIS — N200 Calculus of kidney: Secondary | ICD-10-CM | POA: Diagnosis not present

## 2020-06-07 LAB — POCT INR: INR: 2.1 (ref 2.0–3.0)

## 2020-06-07 NOTE — ED Provider Notes (Signed)
[facility] DEPT Provider Note   CSN: [id number] Arrival date & time: 06/07/20  1526     History Chief Complaint  Patient presents with  . PICC line issue    [name] is a 87 y.o. male.   [name] , a 87 y.o. male  was evaluated in triage.  Pt complains of PICC line coming out, ESBL kidney infection. Did get 10AM dose, next dose scheduled for tomorrow morning. Getting Invanz.         Past Medical History:  Diagnosis Date  . Aortic valve stenosis 04/05/2015   Formatting of this note might be different from the original. Overview:  ECHO 5/15 Formatting of this note might be different from the original. Overview:  Overview:  ECHO 5/15  . Arthritis    "knees, right shoulder" (03/14/2017)  . Atrial fibrillation, persistent ([place]) 09/19/2014  . CAD (coronary artery disease), native coronary artery    Cath 2011 LHC (08/2009):~ Proximal LAD 30%, mid to distal LAD 25%, ostial small D1 75% mid AV groove circumflex 99% been subtotal stenosis, proximal to mid RCA 25-30%, mid RCA 30%, mid PDA 30%, EF 50% with inferior hypokinesis.;    July 2011  PCI and DES to circumflex Dr. Olevia Perches   ETT-Myoview (06/2013):  Inferolateral scar, mild peri-infarct ischemia, EF 40%; Intermediate Risk   ECHO EF 55% 03/2013   . CAD (coronary artery disease), native coronary artery 04/05/2015   Cath 2011 LHC (08/2009):~ Proximal LAD 30%, mid to distal LAD 25%, ostial small D1 75% mid AV groove circumflex 99% been subtotal stenosis, proximal to mid RCA 25-30%, mid RCA 30%, mid PDA 30%, EF 50% with inferior hypokinesis.;    July 2011  PCI and DES to circumflex Dr. Olevia Perches   ETT-Myoview (06/2013):  Inferolateral scar, mild peri-infarct ischemia, EF 40%; Intermediate Risk   ECHO EF 55% 03/2013    . Cardiac pacemaker in situ 03/14/2017   Biventricular St. Jude inserted 03/14/17 Dr. Lovena Le for second degree heart block   . Chronic combined systolic and diastolic heart failure ([place])  11/19/2017  . CKD (chronic kidney disease), stage III ([place])   . Gastro-esophageal reflux disease without esophagitis 09/24/2009  . GERD   . History of infection of prosthetic knee 04/05/2015   Treated with debridement and irrigation followed by 6 months of triple antibiotics  . History of kidney stones   . History of peptic ulcer 1970s?  Marland Kitchen History of prostate cancer    Radical prostatectomy in 2006 Dr. Terance Hart   . Hypertensive heart disease   . Internal hemorrhoids 09/11/2018  . Long term (current) use of anticoagulants 10/06/2011  . Mixed hyperlipidemia   . Moderate persistent asthma without complication 10/05/9405  . Noise effect on both inner ears 08/14/2018  . Obesity (BMI 30-39.9)   . OSA on CPAP   . OSA treated with BiPAP 11/15/2015  . Persistent atrial fibrillation ([place]) 09/19/2014   CHA2DS2VASC score 5  Cardioversion 10/08/2014 was on amiodarone until 2018   . 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)    Initial DVT in 2006 after prostate surgery and had [place] filter placed Bilateral  PE 2013 and placed back on warfarin DVT of right subclavian vein on doppler 10/2012 at time of knee infection   . Personal history of malignant neoplasm of prostate    Radical prostatectomy in 2006 Dr. Terance Hart    . Presbycusis of both ears 08/14/2018  . Severe aortic stenosis   .  Severe aortic stenosis 03/18/2018  . Urinary incontinence    MULTIPLE BLADDER SURGERIES - STATES NO URINARY SPHINCTER - PT'S UROLOGIST IS AT [facility]- DR. PETERSON  ( LAST VISIT WAS 09/15/11 )    Patient Active Problem List   Diagnosis Date Noted  . Renal abscess   . Perirectal abscess 05/15/2020  . Pyelonephritis of right kidney 05/15/2020  . Presence of IVC filter 05/15/2020  . Constipation, chronic 05/15/2020  . Cysts of right kidney 05/15/2020  . Severe aortic stenosis   . OSA on CPAP   . History of prostate cancer   . History of peptic ulcer   . History of kidney stones   . GERD   . CAD  (coronary artery disease), native coronary artery   . Arthritis   . Bulbous urethral stricture 12/03/2019  . Internal hemorrhoids 09/11/2018  . Noise effect on both inner ears 08/14/2018  . Presbycusis of both ears 08/14/2018  . 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 CKD (chronic kidney disease), stage III ([place])   . Chronic combined systolic and diastolic heart failure ([place]) 11/19/2017  . Cardiac pacemaker in situ 03/14/2017  . 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) 01/24/2016  . OSA treated with BiPAP 11/15/2015  . Personal history of malignant neoplasm of prostate 04/05/2015  . Hypertensive heart disease 04/05/2015  . 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[place]) 04/05/2015  . CAD (coronary artery disease), native coronary artery 04/05/2015  . Aortic valve stenosis 04/05/2015  . Persistent atrial fibrillation ([place]) 09/19/2014  . Moderate persistent asthma without complication 35/70/1779  . Atrial fibrillation, persistent ([place]) 09/19/2014  . Angina pectoris ([place]) 09/18/2014  . Urinary incontinence 03/04/2014  . Obesity (BMI 30-39.9)   . Long term (current) use of anticoagulants 10/06/2011  . Mixed hyperlipidemia 02/24/2010  . Gastro-esophageal reflux disease without esophagitis 09/24/2009    Past Surgical History:  Procedure Laterality Date  . APPENDECTOMY    . BI-VENTRICULAR PACEMAKER INSERTION (CRT-P)  03/14/2017  . BIV PACEMAKER INSERTION CRT-P N/A 03/14/2017   Procedure: BIV PACEMAKER INSERTION CRT-P;  Surgeon: Evans Lance, MD;  Location: [facility];  Service: Cardiovascular;  Laterality: N/A;  . CARDIOVERSION N/A 10/08/2014   Procedure: CARDIOVERSION;  Surgeon: Jacolyn Reedy, MD;  Location: [facility] ENDOSCOPY;  Service: Cardiovascular;  Laterality: N/A;  . CATARACT EXTRACTION W/ INTRAOCULAR LENS  IMPLANT, BILATERAL Bilateral   . CORONARY ANGIOPLASTY WITH STENT PLACEMENT  08/25/2009   DES-mid LCx 08/2009; 30% pLAD, 25%  m/dLAD, 75% ostial D1, 99% mLCx s/p DES, 25-30% p/mRCA, 40% mRCA, 30% mPDA stenoses; LVEF 50%, inf hypokinesis  . CORONARY STENT INTERVENTION N/A 03/18/2018   Procedure: CORONARY STENT INTERVENTION;  Surgeon: Sherren Mocha, MD;  Location: [facility] LAB;  Service: Cardiovascular;  Laterality: N/A;  . EXCISIONAL HEMORRHOIDECTOMY    . I & D KNEE WITH POLY EXCHANGE Left 10/09/2012   Procedure: IRRIGATION AND DEBRIDEMENT LEFT KNEE WITH POLY REVISION;  Surgeon: Gearlean Alf, MD;  Location: [facility] ORS;  Service: Orthopedics;  Laterality: Left;  . INCISION AND DRAINAGE ABSCESS N/A 05/16/2020   Procedure: INCISION AND DRAINAGE  PERIRECTAL ABSCESS;  Surgeon: Rolm Bookbinder, MD;  Location: [facility] ORS;  Service: General;  Laterality: N/A;  . INGUINAL HERNIA REPAIR Right   . INTRAOPERATIVE TRANSTHORACIC ECHOCARDIOGRAM  03/19/2018   Procedure: Intraoperative Transthoracic Echocardiogram;  Surgeon: Sherren Mocha, MD;  Location: [facility];  Service: Open Heart Surgery;;  . JOINT REPLACEMENT    . KNEE ARTHROTOMY  Left 10/09/2012   Procedure: LEFT KNEE ARTHROTOMY;  Surgeon: Gearlean Alf, MD;  Location: [facility];  Service: Orthopedics;  Laterality: Left;  . LEFT HEART CATH AND CORONARY ANGIOGRAPHY N/A 02/28/2018   Procedure: LEFT HEART CATH AND CORONARY ANGIOGRAPHY;  Surgeon: Sherren Mocha, MD;  Location: [facility] LAB;  Service: Cardiovascular;  Laterality: N/A;  . LEFT HEART CATH AND CORONARY ANGIOGRAPHY N/A 09/12/2019   Procedure: LEFT HEART CATH AND CORONARY ANGIOGRAPHY;  Surgeon: Sherren Mocha, MD;  Location: [facility];  Service: Cardiovascular;  Laterality: N/A;  . PROSTATECTOMY  04/25/2004  . REPLACEMENT TOTAL KNEE Bilateral   . SKIN BIOPSY     "off nose; wasn't cancer; it was tested" (03/14/2017)  . TRANSCATHETER AORTIC VALVE REPLACEMENT, TRANSFEMORAL N/A 03/19/2018   Procedure: TRANSCATHETER AORTIC VALVE REPLACEMENT, TRANSFEMORAL;  Surgeon: Sherren Mocha, MD;  Location: [facility];  Service: Open  Heart Surgery;  Laterality: N/A;  . Uretheral implants     multiple for incontinence  . VENA CAVA FILTER PLACEMENT         Family History  Problem Relation Age of Onset  . Melanoma Father   . Melanoma Brother     Social History   Tobacco Use  . Smoking status: Former Smoker    Packs/day: 1.00    Years: 10.00    Pack years: 10.00    Types: Cigarettes    Quit date: 04/27/1961    Years since quitting: 59.1  . Smokeless tobacco: Never Used  Vaping Use  . Vaping Use: Never used  Substance Use Topics  . Alcohol use: No    Alcohol/week: 0.0 standard drinks  . Drug use: No    Home Medications Prior to Admission medications   Medication Sig Start Date End Date Taking? Authorizing Provider  acetaminophen (TYLENOL) 500 MG tablet Take 500 mg by mouth at bedtime.     [provider]  amLODipine (NORVASC) 2.5 MG tablet Take 1 tablet (2.5 mg total) by mouth daily. 09/03/19   Tobb, Kardie, DO  Ascorbic Acid (VITAMIN C) 1000 MG tablet Take 1,000 mg by mouth daily.    [provider]  Biotin 2500 MCG CAPS Take 2,500 mcg by mouth daily.    [provider]  cholecalciferol (VITAMIN D) 1000 UNITS tablet Take 1,000 Units by mouth daily.    [provider]  docusate sodium (COLACE) 50 MG capsule Take 50 mg by mouth daily.    [provider]  ertapenem (INVANZ) IVPB Inject 1 g into the vein daily for 22 days. Indication:  ESBL renal abscess  First Dose: No Last Day of Therapy:  06/17/2020 Labs - Once weekly:  CBC/D and BMP, Labs - Every other week:  ESR and CRP Method of administration: Mini-Bag Plus / Gravity Method of administration may be changed at the discretion of home infusion pharmacist based upon assessment of the patient and/or caregiver's ability to self-administer the medication ordered. 05/27/20 06/18/20  Raiford Noble Latif, DO  furosemide (LASIX) 20 MG tablet Take 1 tablet (20 mg total) by mouth daily. 06/02/20   Richardo Priest, MD   Glucosamine HCl (GLUCOSAMINE PO) Take 2,000 mg by mouth daily.    [provider]  HYDROcodone-acetaminophen (NORCO/VICODIN) 5-325 MG tablet Take 1 tablet by mouth every 4 (four) hours as needed for severe pain. 05/28/20   Raiford Noble Latif, DO  isosorbide mononitrate (IMDUR) 30 MG 24 hr tablet Take 0.5 tablets (15 mg total) by mouth daily. 02/05/20   Shirlee More  J, MD  Multiple Vitamin (MULTIVITAMIN) capsule Take 1 capsule by mouth daily.  04/28/19   [provider]  nitroGLYCERIN (NITROSTAT) 0.4 MG SL tablet Place 1 tablet (0.4 mg total) under the tongue every 5 (five) minutes as needed. 07/14/19   Richardo Priest, MD  Omega-3 Fatty Acids (FISH OIL) 1000 MG CAPS Take 1,000 mg by mouth daily.    [provider]  pantoprazole (PROTONIX) 40 MG tablet Take 1 tablet (40 mg total) by mouth daily at 6 (six) AM. 05/29/20   Sheikh, Georgina Quint Latif, DO  polyethylene glycol (MIRALAX / GLYCOLAX) 17 g packet Take 17 g by mouth 2 (two) times daily. 05/28/20   Raiford Noble Latif, DO  pravastatin (PRAVACHOL) 20 MG tablet TAKE ONE TABLET BY MOUTH ONCE DAILY Patient taking differently: Take 20 mg by mouth daily. 01/08/20   Richardo Priest, MD  vitamin B-12 (CYANOCOBALAMIN) 500 MCG tablet Take 500 mcg by mouth daily.    [provider]  warfarin (COUMADIN) 2.5 MG tablet Take 1 tablet daily or as directed by [facility] Patient taking differently: Take 2.5 mg by mouth daily. 04/16/20   Richardo Priest, MD    Allergies    Darifenacin, Codeine, Sulfamethoxazole-trimethoprim, and Lisinopril  Review of Systems   Review of Systems  Constitutional: Negative for fever.  Gastrointestinal: Negative for vomiting.  Skin: Negative for wound.  Neurological: Negative for weakness.  Psychiatric/Behavioral: Negative for confusion.    Physical Exam Updated Vital Signs BP 119/87 (BP Location: Left Arm)   Pulse 66   Temp 97.9 F (36.6 C) (Oral)   Resp 18   Ht 6' (1.829 m)   Wt 97.1  kg   SpO2 100%   BMI 29.02 kg/m   Physical Exam Vitals and nursing note reviewed.  Constitutional:      General: He is not in acute distress.    Appearance: He is well-developed. He is not diaphoretic.  HENT:     Head: Normocephalic and atraumatic.  Pulmonary:     Effort: Pulmonary effort is normal.  Musculoskeletal:        General: No swelling or tenderness.     Comments: PICC line in place and right upper arm, questionably withdrawn but slightly if so.  Skin:    General: Skin is warm and dry.     Findings: No erythema or rash.  Neurological:     Mental Status: He is alert and oriented to person, place, and time.  Psychiatric:        Behavior: Behavior normal.     ED Results / Procedures / Treatments   Labs (all labs ordered are listed, but only abnormal results are displayed) Labs Reviewed - No data to display  EKG None  Radiology DG Chest 2 View  Result Date: 06/07/2020 CLINICAL DATA:  Check PICC line placement EXAM: CHEST - 2 VIEW COMPARISON:  05/27/2020 FINDINGS: Cardiac shadow is stable. Pacing device is noted as well as changes of prior TAVR. Right-sided PICC line is noted. Catheter tip is noted in the right innominate vein slightly withdrawn from the prior exam when the tip was in the superior vena cava proximally. This is only slight withdrawal when compared with the prior exam. No focal infiltrate is seen. IMPRESSION: Slight withdrawal of the PICC line as described above. Electronically Signed   By: Inez Catalina M.D.   On: 06/07/2020 16:19    Procedures Procedures   Medications Ordered in ED Medications - No data to display  ED  Course  I have reviewed the triage vital signs and the nursing notes.  Pertinent labs & imaging results that were available during my care of the patient were reviewed by me and considered in my medical decision making (see chart for details).  Clinical Course as of 06/07/20 1738  Mon Jun 08, 3962  [id number] 87 year old male with PICC  line to right arm with concern for displaced line.  X-ray shows only slight withdrawal when compared to prior exam.  Discussed with Dr. Francia Greaves, ER attending.  Patient may be discharged to follow-up with his care team. [LM]    Clinical Course User Index [LM] [name]   MDM Rules/Calculators/A&P                          Final Clinical Impression(s) / ED Diagnoses Final diagnoses:  Status post PICC central line placement    Rx / DC Orders ED Discharge Orders    None       Tacy Learn, PA-C 06/07/20 1738    Valarie Merino, MD 06/09/20 [phone]

## 2020-06-07 NOTE — Telephone Encounter (Signed)
Received call from patient's home health RN stating that during the PICC dressing change the line was pulled about 4.5 cm. He was able to flush the line and get labs. Stated he didn't want to push the line back in; RN advised him not to do so. RN called patient's wife and instructed her to take the patient to the ED in order to evaluate the line and ensure proper placement.   RN called [facility] ED to inquire about the wait time. Instructed his wife to take him as ED staff stated the wait is roughly 1-2 hours. Instructed her to call our office back with any questions or concerns.   Jossilyn Benda Lorita Officer, RN

## 2020-06-07 NOTE — Patient Instructions (Signed)
Description   Spoke with Louie Casa, RN with [facility], and advised pt to continue taking warfarin 1 tablet daily except for 1/2 a tablet on Wednesdays. Recheck INR in 1 week by home health. [facility] [phone].

## 2020-06-07 NOTE — Discharge Instructions (Addendum)
Placement appears adequate. Follow up with your care team as scheduled.

## 2020-06-07 NOTE — Telephone Encounter (Signed)
Verbal orders given to [facility] to draw CRP non-cardiac. Previous CRP's have been cardiac CRP. Debbie read orders back correctly. Also informed them that the patient was going to the ED for PICC assessment.   Grason Brailsford Lorita Officer, RN

## 2020-06-07 NOTE — ED Triage Notes (Signed)
Patient's wife reports that the patient has a right upper line PICC line and the Home health nurse came today and called the physician to report taht the PICC line was not in the right place. Patient was told to come to the ED to have an x-ray for PICC line placement.

## 2020-06-07 NOTE — ED Triage Notes (Signed)
Emergency Medicine Provider Triage Evaluation Note  [name] , a 87 y.o. male  was evaluated in triage.  Pt complains of PICC line coming out, ESBL kidney infection. Did get 10AM dose, next dose scheduled for tomorrow morning. Getting Invanz.   Review of Systems  Positive: PICC line in place ?displaced Negative: fever  Physical Exam  There were no vitals taken for this visit. Gen:   Awake, no distress   HEENT:  Atraumatic  Resp:  Normal effort  Cardiac:  Normal rate  Abd:   Nondistended, nontender  MSK:   Moves extremities without difficulty  Neuro:  Speech clear   Medical Decision Making  Medically screening exam initiated at 3:32 PM.  Appropriate orders placed.  [name] was informed that the remainder of the evaluation will be completed by another provider, this initial triage assessment does not replace that evaluation, and the importance of remaining in the ED until their evaluation is complete.  Clinical Impression     [name] 06/07/20 1533

## 2020-06-08 ENCOUNTER — Other Ambulatory Visit: Payer: Medicare Other

## 2020-06-09 DIAGNOSIS — I4821 Permanent atrial fibrillation: Secondary | ICD-10-CM | POA: Diagnosis not present

## 2020-06-09 DIAGNOSIS — I130 Hypertensive heart and chronic kidney disease with heart failure and stage 1 through stage 4 chronic kidney disease, or unspecified chronic kidney disease: Secondary | ICD-10-CM | POA: Diagnosis not present

## 2020-06-09 DIAGNOSIS — N200 Calculus of kidney: Secondary | ICD-10-CM | POA: Diagnosis not present

## 2020-06-09 DIAGNOSIS — K611 Rectal abscess: Secondary | ICD-10-CM | POA: Diagnosis not present

## 2020-06-09 DIAGNOSIS — I2510 Atherosclerotic heart disease of native coronary artery without angina pectoris: Secondary | ICD-10-CM | POA: Diagnosis not present

## 2020-06-09 DIAGNOSIS — N119 Chronic tubulo-interstitial nephritis, unspecified: Secondary | ICD-10-CM | POA: Diagnosis not present

## 2020-06-10 ENCOUNTER — Ambulatory Visit
Admission: RE | Admit: 2020-06-10 | Discharge: 2020-06-10 | Disposition: A | Discharge status: Home / Self Care | Payer: Medicare Other | Source: Ambulatory Visit | Attending: General Surgery | Admitting: General Surgery

## 2020-06-10 ENCOUNTER — Encounter: Payer: Self-pay | Admitting: *Deleted

## 2020-06-10 ENCOUNTER — Ambulatory Visit
Admission: RE | Admit: 2020-06-10 | Discharge: 2020-06-10 | Disposition: A | Discharge status: Home / Self Care | Payer: Medicare Other | Source: Ambulatory Visit | Attending: Student | Admitting: Student

## 2020-06-10 ENCOUNTER — Other Ambulatory Visit (HOSPITAL_COMMUNITY): Payer: Self-pay | Admitting: Diagnostic Radiology

## 2020-06-10 DIAGNOSIS — N151 Renal and perinephric abscess: Secondary | ICD-10-CM | POA: Diagnosis not present

## 2020-06-10 DIAGNOSIS — Z4682 Encounter for fitting and adjustment of non-vascular catheter: Secondary | ICD-10-CM | POA: Diagnosis not present

## 2020-06-10 DIAGNOSIS — N200 Calculus of kidney: Secondary | ICD-10-CM | POA: Diagnosis not present

## 2020-06-10 DIAGNOSIS — T82524A Displacement of infusion catheter, initial encounter: Secondary | ICD-10-CM

## 2020-06-10 DIAGNOSIS — I714 Abdominal aortic aneurysm, without rupture: Secondary | ICD-10-CM | POA: Diagnosis not present

## 2020-06-10 DIAGNOSIS — K8020 Calculus of gallbladder without cholecystitis without obstruction: Secondary | ICD-10-CM | POA: Diagnosis not present

## 2020-06-10 DIAGNOSIS — M4316 Spondylolisthesis, lumbar region: Secondary | ICD-10-CM | POA: Diagnosis not present

## 2020-06-10 HISTORY — PX: IR RADIOLOGIST EVAL & MGMT: IMG5224

## 2020-06-10 MED ORDER — IOPAMIDOL (ISOVUE-300) INJECTION 61%
100.0000 mL | Freq: Once | INTRAVENOUS | Status: AC | PRN
  Administered 2020-06-10: 100 mL via INTRAVENOUS

## 2020-06-10 NOTE — Progress Notes (Signed)
Referring Physician(s): Dr. Wyline Copas   Chief Complaint: The patient is seen in follow up today s/p right renal subcapsular abscess s/p drain placement 05/18/20  History of present illness:  [name] is an 87 year old male with past medical history significant for prostate carcinoma with prior prostatectomy in 2016, permanent atrial fibrillation, history of recurrent DVT/PE with prior filter placement, on Coumadin, aortic stenosis with prior TAVR, coronary artery disease with prior stenting, GERD, obstructive sleep apnea, hyperlipidemia, hypertensive heart failure, history of heart block with pacemaker, chronic kidney disease who was recently admitted to [facility] on 05/15/20 with rectal pain/blood on toilet tissue with noted drop in hemoglobin and heme positive stools. He was subsequently found to have a perirectal abscess and underwent I&D on 05/16/2020. Cultures grew E. coli. Patient also has a history of a large right kidney stone approximately 1 month ago with UTI but failed treatment with Bactrim.  He underwent CT scan on 3/22 which revealed moderate to severe right perinephric edema and stranding in the right pelvis and proximal ureter but given that his leukocytosis was improving he was asked to continue previous course of antibiotic. CT abdomen pelvis performed on 4/9 revealed interval increase in size of the suspected right subcapsular renal fluid collection since prior exam with additional findings suggestive of acute right-sided pyelonephritis. There was also a nonobstructing calculus within the right kidney. IR was requested to evaluate this patient and on 05/18/20 he underwent a right renal subcapsular abscess drain with Dr. Laurence Ferrari.   Follow up CT imaging performed 05/24/20 demonstrated partial decompression of the right perinephric collection with enlarging 2 cm and 1.6 cm collections possibly representing developing small perinephric abscesses. The patient was discharged  home with the drain in place on 05/28/20.  He presents to the outpatient clinic today for drain follow up including repeat CT imaging. His wife accompanies him today and they report a daily drain output of approximately 5 ml. The drain is flushed daily and he remains on IV antibiotics via right upper arm PICC. He has been afebrile and without pain or nausea. He denies any issues with bowel or bladder function.   Of note, he presented to the [facility] ED 06/07/20 with a chief complaint of PICC line partially retracted. Imaging showed the catheter tip positioned in the right innominate vein; only slightly withdrawn compared to prior exam. He was discharged home without intervention.   Past Medical History:  Diagnosis Date  . Aortic valve stenosis 04/05/2015   Formatting of this note might be different from the original. Overview:  ECHO 5/15 Formatting of this note might be different from the original. Overview:  Overview:  ECHO 5/15  . Arthritis    "knees, right shoulder" (03/14/2017)  . Atrial fibrillation, persistent ([place]) 09/19/2014  . CAD (coronary artery disease), native coronary artery    Cath 2011 LHC (08/2009):~ Proximal LAD 30%, mid to distal LAD 25%, ostial small D1 75% mid AV groove circumflex 99% been subtotal stenosis, proximal to mid RCA 25-30%, mid RCA 30%, mid PDA 30%, EF 50% with inferior hypokinesis.;    July 2011  PCI and DES to circumflex Dr. Olevia Perches   ETT-Myoview (06/2013):  Inferolateral scar, mild peri-infarct ischemia, EF 40%; Intermediate Risk   ECHO EF 55% 03/2013   . CAD (coronary artery disease), native coronary artery 04/05/2015   Cath 2011 LHC (08/2009):~ Proximal LAD 30%, mid to distal LAD 25%, ostial small D1 75% mid AV groove circumflex 99% been subtotal stenosis, proximal  to mid RCA 25-30%, mid RCA 30%, mid PDA 30%, EF 50% with inferior hypokinesis.;    July 2011  PCI and DES to circumflex Dr. Olevia Perches   ETT-Myoview (06/2013):  Inferolateral scar, mild peri-infarct ischemia, EF 40%;  Intermediate Risk   ECHO EF 55% 03/2013    . Cardiac pacemaker in situ 03/14/2017   Biventricular [employer] Jude inserted 03/14/17 Dr. Lovena Le for second degree heart block   . Chronic combined systolic and diastolic heart failure ([place]) 11/19/2017  . CKD (chronic kidney disease), stage III ([place])   . Gastro-esophageal reflux disease without esophagitis 09/24/2009  . GERD   . History of infection of prosthetic knee 04/05/2015   Treated with debridement and irrigation followed by 6 months of triple antibiotics  . History of kidney stones   . History of peptic ulcer 1970s?  Marland Kitchen History of prostate cancer    Radical prostatectomy in 2006 Dr. Terance Hart   . Hypertensive heart disease   . Internal hemorrhoids 09/11/2018  . Long term (current) use of anticoagulants 10/06/2011  . Mixed hyperlipidemia   . Moderate persistent asthma without complication 3/72/9021  . Noise effect on both inner ears 08/14/2018  . Obesity (BMI 30-39.9)   . OSA on CPAP   . OSA treated with BiPAP 11/15/2015  . Persistent atrial fibrillation ([place]) 09/19/2014   CHA2DS2VASC score 5  Cardioversion 10/08/2014 was on amiodarone until 2018   . 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)    Initial DVT in 2006 after prostate surgery and had [place] filter placed Bilateral  PE 2013 and placed back on warfarin DVT of right subclavian vein on doppler 10/2012 at time of knee infection   . Personal history of malignant neoplasm of prostate    Radical prostatectomy in 2006 Dr. Terance Hart    . Presbycusis of both ears 08/14/2018  . Severe aortic stenosis   . Severe aortic stenosis 03/18/2018  . Urinary incontinence    MULTIPLE BLADDER SURGERIES - STATES NO URINARY SPHINCTER - PT'S UROLOGIST IS AT [facility]- DR. PETERSON  ( LAST VISIT WAS 09/15/11 )    Past Surgical History:  Procedure Laterality Date  . APPENDECTOMY    . BI-VENTRICULAR PACEMAKER INSERTION (CRT-P)  03/14/2017  . BIV PACEMAKER INSERTION CRT-P N/A 03/14/2017    Procedure: BIV PACEMAKER INSERTION CRT-P;  Surgeon: Evans Lance, MD;  Location: [place] [facility];  Service: Cardiovascular;  Laterality: N/A;  . CARDIOVERSION N/A 10/08/2014   Procedure: CARDIOVERSION;  Surgeon: Jacolyn Reedy, MD;  Location: [facility] ENDOSCOPY;  Service: Cardiovascular;  Laterality: N/A;  . CATARACT EXTRACTION W/ INTRAOCULAR LENS  IMPLANT, BILATERAL Bilateral   . CORONARY ANGIOPLASTY WITH STENT PLACEMENT  08/25/2009   DES-mid LCx 08/2009; 30% pLAD, 25% m/dLAD, 75% ostial D1, 99% mLCx s/p DES, 25-30% p/mRCA, 40% mRCA, 30% mPDA stenoses; LVEF 50%, inf hypokinesis  . CORONARY STENT INTERVENTION N/A 03/18/2018   Procedure: CORONARY STENT INTERVENTION;  Surgeon: Sherren Mocha, MD;  Location: [place] [facility] LAB;  Service: Cardiovascular;  Laterality: N/A;  . EXCISIONAL HEMORRHOIDECTOMY    . I & D KNEE WITH POLY EXCHANGE Left 10/09/2012   Procedure: IRRIGATION AND DEBRIDEMENT LEFT KNEE WITH POLY REVISION;  Surgeon: Gearlean Alf, MD;  Location: [facility] OR[facility];  Service: Orthopedics;  Laterality: Left;  . INCISION AND DRAINAGE ABSCESS N/A 05/16/2020   Procedure: INCISION AND DRAINAGE  PERIRECTAL ABSCESS;  Surgeon: Rolm Bookbinder, MD;  Location: [facility] OR[facility];  Service: General;  Laterality: N/A;  . INGUINAL  HERNIA REPAIR Right   . INTRAOPERATIVE TRANSTHORACIC ECHOCARDIOGRAM  03/19/2018   Procedure: Intraoperative Transthoracic Echocardiogram;  Surgeon: Sherren Mocha, MD;  Location: [place];  Service: Open Heart Surgery;;  . IR RADIOLOGIST EVAL & MGMT  06/10/2020  . JOINT REPLACEMENT    . KNEE ARTHROTOMY Left 10/09/2012   Procedure: LEFT KNEE ARTHROTOMY;  Surgeon: Gearlean Alf, MD;  Location: [facility];  Service: Orthopedics;  Laterality: Left;  . LEFT HEART CATH AND CORONARY ANGIOGRAPHY N/A 02/28/2018   Procedure: LEFT HEART CATH AND CORONARY ANGIOGRAPHY;  Surgeon: Sherren Mocha, MD;  Location: [place] [facility] LAB;  Service: Cardiovascular;  Laterality: N/A;  . LEFT HEART CATH AND CORONARY ANGIOGRAPHY  N/A 09/12/2019   Procedure: LEFT HEART CATH AND CORONARY ANGIOGRAPHY;  Surgeon: Sherren Mocha, MD;  Location: [place] [facility];  Service: Cardiovascular;  Laterality: N/A;  . PROSTATECTOMY  04/25/2004  . REPLACEMENT TOTAL KNEE Bilateral   . SKIN BIOPSY     "off nose; wasn't cancer; it was tested" (03/14/2017)  . TRANSCATHETER AORTIC VALVE REPLACEMENT, TRANSFEMORAL N/A 03/19/2018   Procedure: TRANSCATHETER AORTIC VALVE REPLACEMENT, TRANSFEMORAL;  Surgeon: Sherren Mocha, MD;  Location: [place];  Service: Open Heart Surgery;  Laterality: N/A;  . Uretheral implants     multiple for incontinence  . VENA CAVA FILTER PLACEMENT      Allergies: Darifenacin, Codeine, Sulfamethoxazole-trimethoprim, and Lisinopril  Medications: Prior to Admission medications   Medication Sig Start Date End Date Taking? Authorizing Provider  acetaminophen (TYLENOL) 500 MG tablet Take 500 mg by mouth at bedtime.     [provider]  amLODipine (NORVASC) 2.5 MG tablet Take 1 tablet (2.5 mg total) by mouth daily. 09/03/19   Tobb, Kardie, DO  Ascorbic Acid (VITAMIN C) 1000 MG tablet Take 1,000 mg by mouth daily.    [provider]  Biotin 2500 MCG CAPS Take 2,500 mcg by mouth daily.    [provider]  cholecalciferol (VITAMIN D) 1000 UNITS tablet Take 1,000 Units by mouth daily.    [provider]  docusate sodium (COLACE) 50 MG capsule Take 50 mg by mouth daily.    [provider]  ertapenem (INVANZ) IVPB Inject 1 g into the vein daily for 22 days. Indication:  ESBL renal abscess  First Dose: No Last Day of Therapy:  06/17/2020 Labs - Once weekly:  CBC/D and BMP, Labs - Every other week:  ESR and CRP Method of administration: Mini-Bag Plus / Gravity Method of administration may be changed at the discretion of home infusion pharmacist based upon assessment of the patient and/or caregiver's ability to self-administer the medication ordered. 05/27/20 06/18/20  Raiford Noble Latif,  DO  furosemide (LASIX) 20 MG tablet Take 1 tablet (20 mg total) by mouth daily. 06/02/20   Richardo Priest, MD  Glucosamine HCl (GLUCOSAMINE PO) Take 2,000 mg by mouth daily.    [provider]  HYDROcodone-acetaminophen (NORCO/VICODIN) 5-325 MG tablet Take 1 tablet by mouth every 4 (four) hours as needed for severe pain. 05/28/20   Raiford Noble Latif, DO  isosorbide mononitrate (IMDUR) 30 MG 24 hr tablet Take 0.5 tablets (15 mg total) by mouth daily. 02/05/20   Richardo Priest, MD  Multiple Vitamin (MULTIVITAMIN) capsule Take 1 capsule by mouth daily.  04/28/19   [provider]  nitroGLYCERIN (NITROSTAT) 0.4 MG SL tablet Place 1 tablet (0.4 mg total) under the tongue every 5 (five) minutes as needed. 07/14/19   Richardo Priest, MD  Omega-3 Fatty Acids (  FISH OIL) 1000 MG CAPS Take 1,000 mg by mouth daily.    [provider]  pantoprazole (PROTONIX) 40 MG tablet Take 1 tablet (40 mg total) by mouth daily at 6 (six) AM. 05/29/20   Sheikh, Georgina Quint Latif, DO  polyethylene glycol (MIRALAX / GLYCOLAX) 17 g packet Take 17 g by mouth 2 (two) times daily. 05/28/20   Raiford Noble Latif, DO  pravastatin (PRAVACHOL) 20 MG tablet TAKE ONE TABLET BY MOUTH ONCE DAILY Patient taking differently: Take 20 mg by mouth daily. 01/08/20   Richardo Priest, MD  vitamin B-12 (CYANOCOBALAMIN) 500 MCG tablet Take 500 mcg by mouth daily.    [provider]  warfarin (COUMADIN) 2.5 MG tablet Take 1 tablet daily or as directed by Coumadin Clinic Patient taking differently: Take 2.5 mg by mouth daily. 04/16/20   Richardo Priest, MD     Family History  Problem Relation Age of Onset  . Melanoma Father   . Melanoma Brother     Social History   Socioeconomic History  . Marital status: Married    Spouse name: Not on file  . Number of children: 3  . Years of education: Not on file  . Highest education level: Not on file  Occupational History  . Occupation: Retired Therapist, nutritional  Tobacco Use   . Smoking status: Former Smoker    Packs/day: 1.00    Years: 10.00    Pack years: 10.00    Types: Cigarettes    Quit date: 04/27/1961    Years since quitting: 59.1  . Smokeless tobacco: Never Used  Vaping Use  . Vaping Use: Never used  Substance and Sexual Activity  . Alcohol use: No    Alcohol/week: 0.0 standard drinks  . Drug use: No  . Sexual activity: Not Currently  Other Topics Concern  . Not on file  Social History Narrative  . Not on file   Social Determinants of Health   Financial Resource Strain: Not on file  Food Insecurity: Not on file  Transportation Needs: Not on file  Physical Activity: Not on file  Stress: Not on file  Social Connections: Not on file     Vital Signs: There were no vitals taken for this visit.  Physical Exam Constitutional:      General: He is not in acute distress. Cardiovascular:     Comments: Right upper arm PICC; line partially retracted. Biopatch not in proper position around catheter.  Pulmonary:     Effort: Pulmonary effort is normal.  Abdominal:     Palpations: Abdomen is soft.     Tenderness: There is no abdominal tenderness.     Comments: Right flank drain to suction. Approximately 5 ml of cloudy serous fluid in bulb. Skin insertion site has dried drainage around the catheter. Suture and stat-lock intact.   Neurological:     Mental Status: He is alert and oriented to person, place, and time.     Imaging: DG Chest 2 View  Result Date: 06/07/2020 CLINICAL DATA:  Check PICC line placement EXAM: CHEST - 2 VIEW COMPARISON:  05/27/2020 FINDINGS: Cardiac shadow is stable. Pacing device is noted as well as changes of prior TAVR. Right-sided PICC line is noted. Catheter tip is noted in the right innominate vein slightly withdrawn from the prior exam when the tip was in the superior vena cava proximally. This is only slight withdrawal when compared with the prior exam. No focal infiltrate is seen. IMPRESSION: Slight withdrawal of the  PICC  line as described above. Electronically Signed   By: Inez Catalina M.D.   On: 06/07/2020 16:19   IR Radiologist Eval & Mgmt  Result Date: 06/10/2020 Please refer to notes tab for details about interventional procedure. (Op Note)   Labs:  CBC: Recent Labs    05/25/20 0512 05/26/20 0408 05/27/20 0348 05/28/20 0450  WBC 10.8* 9.7 10.7* 7.2  HGB 11.0* 10.3* 11.6* 10.2*  HCT 34.5* 32.8* 36.8* 32.4*  PLT 355 322 348 274    COAGS: Recent Labs    04/22/20 2253 04/27/20 1102 05/27/20 0348 05/28/20 0450 06/02/20 0000 06/07/20 0000  INR 2.7*   < > 2.5* 2.7* 5.8* 2.1  APTT 43*  --   --   --   --   --    < > = values in this interval not displayed.    BMP: Recent Labs    07/14/19 1343 08/24/19 2011 09/08/19 0920 04/22/20 2253 05/25/20 0512 05/26/20 0408 05/27/20 0348 05/28/20 0450  NA 143 139 141   < > 137 135 134* 139  K 4.4 3.9 4.1   < > 4.2 3.9 4.2 4.2  CL 109* 108 106   < > 104 102 101 104  CO2 18* 23 22   < > _0 GLUCOSE 131* 110* 107*   < > 113* 113* 124* 133*  BUN 30* 19 22   < > _1 CALCIUM 10.4* 9.6 10.0   < > 9.4 9.2 9.5 9.6  CREATININE 1.66* 1.43* 1.42*   < > 0.99 1.04 1.18 1.09  GFRNONAA 37* 44* 44*   < > >60 >60 60* >60  GFRAA 43* 51* 51*  --   --   --   --   --    < > = values in this interval not displayed.    LIVER FUNCTION TESTS: Recent Labs    05/18/20 0449 05/19/20 0515 05/27/20 0348 05/28/20 0450  BILITOT 0.8 1.1 1.0 1.0  AST _2 ALT _3 ALKPHOS 69 68 77 71  PROT 5.8* 6.0* 6.7 5.9*  ALBUMIN 2.2* 2.4* 2.5* 2.1*    Assessment:  Right renal subcapsular abscess s/p drain placement 05/18/20: CT imaging today shows the right perinephric abscess along the lateral aspect of the right kidney has resolved with the drain. There are residual collections compatible with small abscesses and these have minimally changed since 05/24/20. Please see complete CT report under imaging in Epic.   Patient reports minimal  daily output, is afebrile and denies pain, nausea or issues with bowel and bladder function. The drain was removed and the patient tolerated this well. The site was covered with gauze/tape. He was instructed to keep the site clean and dry until it heals and to notify his doctor if the site becomes tender, inflamed or develops purulent output. He was also instructed to notify his doctor if he develops flank pain, fevers, nausea, or pain with urination.   He follows with the Infectious Disease team for his IV antibiotics and is unsure of the remaining duration. Due to the PICC being partially retracted the patient will be seen in IR at [facility] next week for a PICC exchange. Both the patient and his wife know they can call the clinic with any questions/concerns.  Signed: Theresa Duty, NP 06/10/2020, 1:57 PM   Please refer to Dr. Anselm Pancoast attestation of this note for management and plan.

## 2020-06-11 ENCOUNTER — Encounter: Payer: Self-pay | Admitting: Internal Medicine

## 2020-06-11 ENCOUNTER — Other Ambulatory Visit: Payer: Self-pay

## 2020-06-11 ENCOUNTER — Other Ambulatory Visit: Payer: Self-pay | Admitting: Physician Assistant

## 2020-06-11 ENCOUNTER — Ambulatory Visit (INDEPENDENT_AMBULATORY_CARE_PROVIDER_SITE_OTHER): Payer: Medicare Other | Admitting: Internal Medicine

## 2020-06-11 ENCOUNTER — Telehealth: Payer: Self-pay

## 2020-06-11 VITALS — BP 123/74 | HR 56 | Temp 97.4°F

## 2020-06-11 DIAGNOSIS — K611 Rectal abscess: Secondary | ICD-10-CM | POA: Diagnosis not present

## 2020-06-11 DIAGNOSIS — I2510 Atherosclerotic heart disease of native coronary artery without angina pectoris: Secondary | ICD-10-CM | POA: Diagnosis not present

## 2020-06-11 DIAGNOSIS — N151 Renal and perinephric abscess: Secondary | ICD-10-CM | POA: Diagnosis not present

## 2020-06-11 DIAGNOSIS — I4821 Permanent atrial fibrillation: Secondary | ICD-10-CM | POA: Diagnosis not present

## 2020-06-11 DIAGNOSIS — I25118 Atherosclerotic heart disease of native coronary artery with other forms of angina pectoris: Secondary | ICD-10-CM

## 2020-06-11 DIAGNOSIS — N200 Calculus of kidney: Secondary | ICD-10-CM | POA: Diagnosis not present

## 2020-06-11 DIAGNOSIS — Z5181 Encounter for therapeutic drug level monitoring: Secondary | ICD-10-CM

## 2020-06-11 DIAGNOSIS — N119 Chronic tubulo-interstitial nephritis, unspecified: Secondary | ICD-10-CM | POA: Diagnosis not present

## 2020-06-11 DIAGNOSIS — I130 Hypertensive heart and chronic kidney disease with heart failure and stage 1 through stage 4 chronic kidney disease, or unspecified chronic kidney disease: Secondary | ICD-10-CM | POA: Diagnosis not present

## 2020-06-11 DIAGNOSIS — Z452 Encounter for adjustment and management of vascular access device: Secondary | ICD-10-CM | POA: Diagnosis not present

## 2020-06-11 NOTE — Assessment & Plan Note (Signed)
picc out a bit and I am in agreement with replacement by IR and this is scheduled for Monday.  I appreciate Dr. Anselm Pancoast and J Covington's assistance.

## 2020-06-11 NOTE — Telephone Encounter (Signed)
RN relayed verbal orders per Dr. Linus Salmons to Tim at [facility] to extend IV antibiotic through 07/01/20, at which point patient has a follow-up appointment with Dr. Juleen China to re-evaluate need for antibiotics. Orders repeated and verified.   Beryle Flock, RN

## 2020-06-11 NOTE — Assessment & Plan Note (Signed)
Will continue with weekly lab monitoring on antibiotics.

## 2020-06-11 NOTE — Progress Notes (Signed)
   Subjective:    Patient ID: [name], male    DOB: [birth year], 87 y.o.   MRN: [id number]  HPI Here for hsfu He developed a perirectal abscess s/p I and D done on 05/16/20 then noted to have a subscapular abscess of the right kidney requiring drain placement by IR on 05/18/20.  Culture from that also grew ESBL E coli and he was placed on prolonged IV ertapenem which he is currently on.  He had a repeat CT scan yesterday that noted resolved abscess collections at the site of the drains but still with residual abscesses found, small but similar in size to the previous scan.  Additionally, his picc line has come out some and he is scheduled on Monday for a replacement by IR.  Overall he is tolerating the antibiotics well with no associated rash or diarrhea.  He is rehabing well and getting stronger, walking more.    Review of Systems  Constitutional: Negative for chills, fatigue and fever.  Gastrointestinal: Negative for diarrhea and nausea.  Skin: Negative for rash.       Objective:   Physical Exam Eyes:     General: No scleral icterus. Skin:    Findings: No rash.  Neurological:     Mental Status: He is alert.  Psychiatric:        Mood and Affect: Mood normal.   sh: no current tobacco        Assessment & Plan:

## 2020-06-11 NOTE — Assessment & Plan Note (Signed)
Doing well though some residual findings on CT with 4 areas from 1.7 to 3.2 cm.  For this reason, I will extend his IV ertapenem for another 2 weeks through 07/01/20.  Prior to stopping, will repeat the CT on about 5/24 and he will follow up with on about 5/26 to determine if he can stop or needs continued treatment.

## 2020-06-14 ENCOUNTER — Ambulatory Visit (HOSPITAL_COMMUNITY)
Admission: RE | Admit: 2020-06-14 | Discharge: 2020-06-14 | Disposition: A | Discharge status: Home / Self Care | Payer: Medicare Other | Source: Ambulatory Visit | Attending: Diagnostic Radiology | Admitting: Diagnostic Radiology

## 2020-06-14 ENCOUNTER — Other Ambulatory Visit: Payer: Self-pay

## 2020-06-14 DIAGNOSIS — T82524A Displacement of infusion catheter, initial encounter: Secondary | ICD-10-CM

## 2020-06-14 DIAGNOSIS — Z452 Encounter for adjustment and management of vascular access device: Secondary | ICD-10-CM | POA: Insufficient documentation

## 2020-06-14 DIAGNOSIS — I130 Hypertensive heart and chronic kidney disease with heart failure and stage 1 through stage 4 chronic kidney disease, or unspecified chronic kidney disease: Secondary | ICD-10-CM | POA: Diagnosis not present

## 2020-06-14 DIAGNOSIS — Z789 Other specified health status: Secondary | ICD-10-CM | POA: Diagnosis not present

## 2020-06-14 DIAGNOSIS — N200 Calculus of kidney: Secondary | ICD-10-CM | POA: Diagnosis not present

## 2020-06-14 DIAGNOSIS — R7881 Bacteremia: Secondary | ICD-10-CM | POA: Insufficient documentation

## 2020-06-14 DIAGNOSIS — K611 Rectal abscess: Secondary | ICD-10-CM | POA: Diagnosis not present

## 2020-06-14 DIAGNOSIS — N1832 Chronic kidney disease, stage 3b: Secondary | ICD-10-CM | POA: Diagnosis not present

## 2020-06-14 DIAGNOSIS — I4821 Permanent atrial fibrillation: Secondary | ICD-10-CM | POA: Diagnosis not present

## 2020-06-14 DIAGNOSIS — N119 Chronic tubulo-interstitial nephritis, unspecified: Secondary | ICD-10-CM | POA: Diagnosis not present

## 2020-06-14 DIAGNOSIS — I2510 Atherosclerotic heart disease of native coronary artery without angina pectoris: Secondary | ICD-10-CM | POA: Diagnosis not present

## 2020-06-14 HISTORY — PX: IR FLUORO GUIDE CV LINE RIGHT: IMG2283

## 2020-06-14 LAB — POCT INR: INR: 2.7 (ref 2.0–3.0)

## 2020-06-14 MED ORDER — LIDOCAINE HCL 1 % IJ SOLN
INTRAMUSCULAR | Status: AC
  Filled 2020-06-14: qty 20

## 2020-06-14 MED ORDER — LIDOCAINE HCL 1 % IJ SOLN
INTRAMUSCULAR | Status: DC | PRN
  Administered 2020-06-14: 10 mL

## 2020-06-14 NOTE — Procedures (Addendum)
PROCEDURE SUMMARY:  Successful replacement of image-guided single lumen PICC line to the RUE (same site as existing PICC). Length 42 cm. Tip at lower SVC/RA. No complications. EBL < 1 mL. Ready for use.  Following procedure, patient's wife, Albertus Chiarelli, updated on procedure per patient's request. All questions answered and concerns addressed.  Please see imaging section of Epic for full dictation.   Earley Abide PA-C 06/14/2020 2:47 PM

## 2020-06-15 ENCOUNTER — Ambulatory Visit (INDEPENDENT_AMBULATORY_CARE_PROVIDER_SITE_OTHER): Payer: Medicare Other | Admitting: *Deleted

## 2020-06-15 ENCOUNTER — Ambulatory Visit (INDEPENDENT_AMBULATORY_CARE_PROVIDER_SITE_OTHER): Payer: Medicare Other

## 2020-06-15 DIAGNOSIS — N119 Chronic tubulo-interstitial nephritis, unspecified: Secondary | ICD-10-CM | POA: Diagnosis not present

## 2020-06-15 DIAGNOSIS — I441 Atrioventricular block, second degree: Secondary | ICD-10-CM

## 2020-06-15 DIAGNOSIS — Z8546 Personal history of malignant neoplasm of prostate: Secondary | ICD-10-CM | POA: Diagnosis not present

## 2020-06-15 DIAGNOSIS — Z86718 Personal history of other venous thrombosis and embolism: Secondary | ICD-10-CM

## 2020-06-15 DIAGNOSIS — M1990 Unspecified osteoarthritis, unspecified site: Secondary | ICD-10-CM | POA: Diagnosis not present

## 2020-06-15 DIAGNOSIS — K611 Rectal abscess: Secondary | ICD-10-CM | POA: Diagnosis not present

## 2020-06-15 DIAGNOSIS — I4891 Unspecified atrial fibrillation: Secondary | ICD-10-CM | POA: Diagnosis not present

## 2020-06-15 DIAGNOSIS — I4819 Other persistent atrial fibrillation: Secondary | ICD-10-CM

## 2020-06-15 DIAGNOSIS — I4821 Permanent atrial fibrillation: Secondary | ICD-10-CM | POA: Diagnosis not present

## 2020-06-15 DIAGNOSIS — E038 Other specified hypothyroidism: Secondary | ICD-10-CM | POA: Diagnosis not present

## 2020-06-15 DIAGNOSIS — Z5181 Encounter for therapeutic drug level monitoring: Secondary | ICD-10-CM | POA: Diagnosis not present

## 2020-06-15 DIAGNOSIS — N200 Calculus of kidney: Secondary | ICD-10-CM | POA: Diagnosis not present

## 2020-06-15 DIAGNOSIS — E785 Hyperlipidemia, unspecified: Secondary | ICD-10-CM | POA: Diagnosis not present

## 2020-06-15 DIAGNOSIS — K219 Gastro-esophageal reflux disease without esophagitis: Secondary | ICD-10-CM | POA: Diagnosis not present

## 2020-06-15 DIAGNOSIS — E7800 Pure hypercholesterolemia, unspecified: Secondary | ICD-10-CM | POA: Diagnosis not present

## 2020-06-15 DIAGNOSIS — I10 Essential (primary) hypertension: Secondary | ICD-10-CM | POA: Diagnosis not present

## 2020-06-15 DIAGNOSIS — N183 Chronic kidney disease, stage 3 unspecified: Secondary | ICD-10-CM | POA: Diagnosis not present

## 2020-06-15 DIAGNOSIS — C61 Malignant neoplasm of prostate: Secondary | ICD-10-CM | POA: Diagnosis not present

## 2020-06-15 DIAGNOSIS — I130 Hypertensive heart and chronic kidney disease with heart failure and stage 1 through stage 4 chronic kidney disease, or unspecified chronic kidney disease: Secondary | ICD-10-CM | POA: Diagnosis not present

## 2020-06-15 DIAGNOSIS — I2510 Atherosclerotic heart disease of native coronary artery without angina pectoris: Secondary | ICD-10-CM | POA: Diagnosis not present

## 2020-06-16 DIAGNOSIS — N200 Calculus of kidney: Secondary | ICD-10-CM | POA: Diagnosis not present

## 2020-06-16 DIAGNOSIS — N119 Chronic tubulo-interstitial nephritis, unspecified: Secondary | ICD-10-CM | POA: Diagnosis not present

## 2020-06-16 DIAGNOSIS — I2510 Atherosclerotic heart disease of native coronary artery without angina pectoris: Secondary | ICD-10-CM | POA: Diagnosis not present

## 2020-06-16 DIAGNOSIS — K611 Rectal abscess: Secondary | ICD-10-CM | POA: Diagnosis not present

## 2020-06-16 DIAGNOSIS — I130 Hypertensive heart and chronic kidney disease with heart failure and stage 1 through stage 4 chronic kidney disease, or unspecified chronic kidney disease: Secondary | ICD-10-CM | POA: Diagnosis not present

## 2020-06-16 DIAGNOSIS — I4821 Permanent atrial fibrillation: Secondary | ICD-10-CM | POA: Diagnosis not present

## 2020-06-16 LAB — CUP PACEART REMOTE DEVICE CHECK
Battery Remaining Longevity: 61 mo
Battery Remaining Percentage: 89 %
Battery Voltage: 2.95 V
Brady Statistic AP VP Percent: 0 %
Brady Statistic AP VS Percent: 0 %
Brady Statistic AS VP Percent: 100 %
Brady Statistic AS VS Percent: 0 %
Brady Statistic RA Percent Paced: 1 %
Date Time Interrogation Session: 20220510081306
Implantable Lead Implant Date: 20190206
Implantable Lead Implant Date: 20190206
Implantable Lead Implant Date: 20190206
Implantable Lead Location: 753858
Implantable Lead Location: 753859
Implantable Lead Location: 753860
Implantable Pulse Generator Implant Date: 20190206
Lead Channel Impedance Value: 360 Ohm
Lead Channel Impedance Value: 410 Ohm
Lead Channel Impedance Value: 640 Ohm
Lead Channel Pacing Threshold Amplitude: 0.375 V
Lead Channel Pacing Threshold Amplitude: 0.875 V
Lead Channel Pacing Threshold Amplitude: 2.5 V
Lead Channel Pacing Threshold Pulse Width: 0.4 ms
Lead Channel Pacing Threshold Pulse Width: 0.5 ms
Lead Channel Pacing Threshold Pulse Width: 0.8 ms
Lead Channel Sensing Intrinsic Amplitude: 2.7 mV
Lead Channel Sensing Intrinsic Amplitude: 9.1 mV
Lead Channel Setting Pacing Amplitude: 1.375
Lead Channel Setting Pacing Amplitude: 2 V
Lead Channel Setting Pacing Amplitude: 3.25 V
Lead Channel Setting Pacing Pulse Width: 0.4 ms
Lead Channel Setting Pacing Pulse Width: 0.8 ms
Lead Channel Setting Sensing Sensitivity: 2 mV
Pulse Gen Model: 3262
Pulse Gen Serial Number: 8995812

## 2020-06-21 DIAGNOSIS — I130 Hypertensive heart and chronic kidney disease with heart failure and stage 1 through stage 4 chronic kidney disease, or unspecified chronic kidney disease: Secondary | ICD-10-CM | POA: Diagnosis not present

## 2020-06-21 DIAGNOSIS — N200 Calculus of kidney: Secondary | ICD-10-CM | POA: Diagnosis not present

## 2020-06-21 DIAGNOSIS — I2510 Atherosclerotic heart disease of native coronary artery without angina pectoris: Secondary | ICD-10-CM | POA: Diagnosis not present

## 2020-06-21 DIAGNOSIS — K611 Rectal abscess: Secondary | ICD-10-CM | POA: Diagnosis not present

## 2020-06-21 DIAGNOSIS — I4821 Permanent atrial fibrillation: Secondary | ICD-10-CM | POA: Diagnosis not present

## 2020-06-21 DIAGNOSIS — N119 Chronic tubulo-interstitial nephritis, unspecified: Secondary | ICD-10-CM | POA: Diagnosis not present

## 2020-06-22 ENCOUNTER — Telehealth: Payer: Self-pay | Admitting: *Deleted

## 2020-06-22 NOTE — Telephone Encounter (Signed)
Called the [facility] Nurse to inquire about the pt's INR that was due on yesterday. She stated he was on another nurses schedule and that nurse was out. She stated she see that it was moved to this Friday. Also, she would call the schedulers that moved the visit to Friday and advised the pt needs to be seen tomorrow since he has an INR check & IV things that need to be taken care of. Thanked her for her assistance. Will follow up tomorrow.   [facility] main line is [phone] if needed.

## 2020-06-23 ENCOUNTER — Telehealth: Payer: Self-pay | Admitting: Internal Medicine

## 2020-06-23 ENCOUNTER — Ambulatory Visit (INDEPENDENT_AMBULATORY_CARE_PROVIDER_SITE_OTHER): Payer: Medicare Other | Admitting: *Deleted

## 2020-06-23 DIAGNOSIS — I130 Hypertensive heart and chronic kidney disease with heart failure and stage 1 through stage 4 chronic kidney disease, or unspecified chronic kidney disease: Secondary | ICD-10-CM | POA: Diagnosis not present

## 2020-06-23 DIAGNOSIS — Z86718 Personal history of other venous thrombosis and embolism: Secondary | ICD-10-CM | POA: Diagnosis not present

## 2020-06-23 DIAGNOSIS — Z5181 Encounter for therapeutic drug level monitoring: Secondary | ICD-10-CM

## 2020-06-23 DIAGNOSIS — I4821 Permanent atrial fibrillation: Secondary | ICD-10-CM | POA: Diagnosis not present

## 2020-06-23 DIAGNOSIS — I4819 Other persistent atrial fibrillation: Secondary | ICD-10-CM | POA: Diagnosis not present

## 2020-06-23 DIAGNOSIS — N119 Chronic tubulo-interstitial nephritis, unspecified: Secondary | ICD-10-CM | POA: Diagnosis not present

## 2020-06-23 DIAGNOSIS — I2510 Atherosclerotic heart disease of native coronary artery without angina pectoris: Secondary | ICD-10-CM | POA: Diagnosis not present

## 2020-06-23 DIAGNOSIS — N200 Calculus of kidney: Secondary | ICD-10-CM | POA: Diagnosis not present

## 2020-06-23 DIAGNOSIS — Z789 Other specified health status: Secondary | ICD-10-CM | POA: Diagnosis not present

## 2020-06-23 DIAGNOSIS — K611 Rectal abscess: Secondary | ICD-10-CM | POA: Diagnosis not present

## 2020-06-23 LAB — POCT INR: INR: 3.9 — AB (ref 2.0–3.0)

## 2020-06-23 NOTE — Patient Instructions (Addendum)
Description   Spoke with Lujean Rave, RN with [facility] and advised pt to hold today's dose and have leafy veggies today then continue taking warfarin 1 tablet daily except for 1/2 tablet on Wednesdays. Recheck INR in 1 week by Home Health-[facility] WILL CHECK INR. Coumadin Clinic [phone].

## 2020-06-23 NOTE — Telephone Encounter (Signed)
Spoke with Lujean Rave, nurse with [facility]. Please refer to Anticoagulation Encounter for details.

## 2020-06-23 NOTE — Telephone Encounter (Signed)
New Messsage:    Lujean Rave have an INR result

## 2020-06-24 DIAGNOSIS — I130 Hypertensive heart and chronic kidney disease with heart failure and stage 1 through stage 4 chronic kidney disease, or unspecified chronic kidney disease: Secondary | ICD-10-CM | POA: Diagnosis not present

## 2020-06-24 DIAGNOSIS — N200 Calculus of kidney: Secondary | ICD-10-CM | POA: Diagnosis not present

## 2020-06-24 DIAGNOSIS — N119 Chronic tubulo-interstitial nephritis, unspecified: Secondary | ICD-10-CM | POA: Diagnosis not present

## 2020-06-24 DIAGNOSIS — I2510 Atherosclerotic heart disease of native coronary artery without angina pectoris: Secondary | ICD-10-CM | POA: Diagnosis not present

## 2020-06-24 DIAGNOSIS — K611 Rectal abscess: Secondary | ICD-10-CM | POA: Diagnosis not present

## 2020-06-24 DIAGNOSIS — I4821 Permanent atrial fibrillation: Secondary | ICD-10-CM | POA: Diagnosis not present

## 2020-06-27 DIAGNOSIS — J4540 Moderate persistent asthma, uncomplicated: Secondary | ICD-10-CM | POA: Diagnosis not present

## 2020-06-27 DIAGNOSIS — R32 Unspecified urinary incontinence: Secondary | ICD-10-CM | POA: Diagnosis not present

## 2020-06-27 DIAGNOSIS — E669 Obesity, unspecified: Secondary | ICD-10-CM | POA: Diagnosis not present

## 2020-06-27 DIAGNOSIS — I130 Hypertensive heart and chronic kidney disease with heart failure and stage 1 through stage 4 chronic kidney disease, or unspecified chronic kidney disease: Secondary | ICD-10-CM | POA: Diagnosis not present

## 2020-06-27 DIAGNOSIS — K5900 Constipation, unspecified: Secondary | ICD-10-CM | POA: Diagnosis not present

## 2020-06-27 DIAGNOSIS — Z6829 Body mass index (BMI) 29.0-29.9, adult: Secondary | ICD-10-CM | POA: Diagnosis not present

## 2020-06-27 DIAGNOSIS — I2510 Atherosclerotic heart disease of native coronary artery without angina pectoris: Secondary | ICD-10-CM | POA: Diagnosis not present

## 2020-06-27 DIAGNOSIS — I459 Conduction disorder, unspecified: Secondary | ICD-10-CM | POA: Diagnosis not present

## 2020-06-27 DIAGNOSIS — I5042 Chronic combined systolic (congestive) and diastolic (congestive) heart failure: Secondary | ICD-10-CM | POA: Diagnosis not present

## 2020-06-27 DIAGNOSIS — N200 Calculus of kidney: Secondary | ICD-10-CM | POA: Diagnosis not present

## 2020-06-27 DIAGNOSIS — Z8546 Personal history of malignant neoplasm of prostate: Secondary | ICD-10-CM | POA: Diagnosis not present

## 2020-06-27 DIAGNOSIS — M150 Primary generalized (osteo)arthritis: Secondary | ICD-10-CM | POA: Diagnosis not present

## 2020-06-27 DIAGNOSIS — G4733 Obstructive sleep apnea (adult) (pediatric): Secondary | ICD-10-CM | POA: Diagnosis not present

## 2020-06-27 DIAGNOSIS — D62 Acute posthemorrhagic anemia: Secondary | ICD-10-CM | POA: Diagnosis not present

## 2020-06-27 DIAGNOSIS — N119 Chronic tubulo-interstitial nephritis, unspecified: Secondary | ICD-10-CM | POA: Diagnosis not present

## 2020-06-27 DIAGNOSIS — K648 Other hemorrhoids: Secondary | ICD-10-CM | POA: Diagnosis not present

## 2020-06-27 DIAGNOSIS — I252 Old myocardial infarction: Secondary | ICD-10-CM | POA: Diagnosis not present

## 2020-06-27 DIAGNOSIS — N1832 Chronic kidney disease, stage 3b: Secondary | ICD-10-CM | POA: Diagnosis not present

## 2020-06-27 DIAGNOSIS — H9113 Presbycusis, bilateral: Secondary | ICD-10-CM | POA: Diagnosis not present

## 2020-06-27 DIAGNOSIS — K219 Gastro-esophageal reflux disease without esophagitis: Secondary | ICD-10-CM | POA: Diagnosis not present

## 2020-06-27 DIAGNOSIS — Z9079 Acquired absence of other genital organ(s): Secondary | ICD-10-CM | POA: Diagnosis not present

## 2020-06-27 DIAGNOSIS — E782 Mixed hyperlipidemia: Secondary | ICD-10-CM | POA: Diagnosis not present

## 2020-06-27 DIAGNOSIS — I4821 Permanent atrial fibrillation: Secondary | ICD-10-CM | POA: Diagnosis not present

## 2020-06-27 DIAGNOSIS — K611 Rectal abscess: Secondary | ICD-10-CM | POA: Diagnosis not present

## 2020-06-27 DIAGNOSIS — R159 Full incontinence of feces: Secondary | ICD-10-CM | POA: Diagnosis not present

## 2020-06-28 ENCOUNTER — Ambulatory Visit (INDEPENDENT_AMBULATORY_CARE_PROVIDER_SITE_OTHER): Payer: Medicare Other | Admitting: *Deleted

## 2020-06-28 DIAGNOSIS — I4819 Other persistent atrial fibrillation: Secondary | ICD-10-CM | POA: Diagnosis not present

## 2020-06-28 DIAGNOSIS — Z5181 Encounter for therapeutic drug level monitoring: Secondary | ICD-10-CM

## 2020-06-28 DIAGNOSIS — I4821 Permanent atrial fibrillation: Secondary | ICD-10-CM | POA: Diagnosis not present

## 2020-06-28 DIAGNOSIS — I130 Hypertensive heart and chronic kidney disease with heart failure and stage 1 through stage 4 chronic kidney disease, or unspecified chronic kidney disease: Secondary | ICD-10-CM | POA: Diagnosis not present

## 2020-06-28 DIAGNOSIS — N200 Calculus of kidney: Secondary | ICD-10-CM | POA: Diagnosis not present

## 2020-06-28 DIAGNOSIS — N119 Chronic tubulo-interstitial nephritis, unspecified: Secondary | ICD-10-CM | POA: Diagnosis not present

## 2020-06-28 DIAGNOSIS — Z86718 Personal history of other venous thrombosis and embolism: Secondary | ICD-10-CM | POA: Diagnosis not present

## 2020-06-28 DIAGNOSIS — K611 Rectal abscess: Secondary | ICD-10-CM | POA: Diagnosis not present

## 2020-06-28 DIAGNOSIS — I2510 Atherosclerotic heart disease of native coronary artery without angina pectoris: Secondary | ICD-10-CM | POA: Diagnosis not present

## 2020-06-28 LAB — POCT INR: INR: 3.9 — AB (ref 2.0–3.0)

## 2020-06-28 NOTE — Patient Instructions (Signed)
Description   Spoke with Louie Casa, LPN with [facility] and advised pt to hold today's dose and have leafy veggies today then start taking warfarin 1 tablet daily except for 1/2 tablet on Wednesdays and Saturdays. Recheck INR in 1 week by Home [facility] WILL CHECK INR. Coumadin Clinic [phone].

## 2020-06-29 ENCOUNTER — Ambulatory Visit
Admission: RE | Admit: 2020-06-29 | Discharge: 2020-06-29 | Disposition: A | Discharge status: Home / Self Care | Payer: Medicare Other | Source: Ambulatory Visit | Attending: Internal Medicine | Admitting: Internal Medicine

## 2020-06-29 DIAGNOSIS — N200 Calculus of kidney: Secondary | ICD-10-CM | POA: Diagnosis not present

## 2020-06-29 DIAGNOSIS — K651 Peritoneal abscess: Secondary | ICD-10-CM | POA: Diagnosis not present

## 2020-06-29 DIAGNOSIS — N281 Cyst of kidney, acquired: Secondary | ICD-10-CM | POA: Diagnosis not present

## 2020-06-29 DIAGNOSIS — D49519 Neoplasm of unspecified behavior of unspecified kidney: Secondary | ICD-10-CM | POA: Diagnosis not present

## 2020-06-29 DIAGNOSIS — N151 Renal and perinephric abscess: Secondary | ICD-10-CM

## 2020-06-29 MED ORDER — IOPAMIDOL (ISOVUE-300) INJECTION 61%
100.0000 mL | Freq: Once | INTRAVENOUS | Status: AC | PRN
  Administered 2020-06-29: 100 mL via INTRAVENOUS

## 2020-07-01 ENCOUNTER — Other Ambulatory Visit: Payer: Self-pay

## 2020-07-01 ENCOUNTER — Ambulatory Visit (INDEPENDENT_AMBULATORY_CARE_PROVIDER_SITE_OTHER): Payer: Medicare Other | Admitting: Internal Medicine

## 2020-07-01 ENCOUNTER — Telehealth: Payer: Self-pay

## 2020-07-01 ENCOUNTER — Encounter: Payer: Self-pay | Admitting: Internal Medicine

## 2020-07-01 DIAGNOSIS — N200 Calculus of kidney: Secondary | ICD-10-CM | POA: Diagnosis not present

## 2020-07-01 DIAGNOSIS — N151 Renal and perinephric abscess: Secondary | ICD-10-CM | POA: Diagnosis not present

## 2020-07-01 DIAGNOSIS — I2510 Atherosclerotic heart disease of native coronary artery without angina pectoris: Secondary | ICD-10-CM | POA: Diagnosis not present

## 2020-07-01 DIAGNOSIS — K611 Rectal abscess: Secondary | ICD-10-CM | POA: Diagnosis not present

## 2020-07-01 DIAGNOSIS — N119 Chronic tubulo-interstitial nephritis, unspecified: Secondary | ICD-10-CM | POA: Diagnosis not present

## 2020-07-01 DIAGNOSIS — I4821 Permanent atrial fibrillation: Secondary | ICD-10-CM | POA: Diagnosis not present

## 2020-07-01 DIAGNOSIS — I130 Hypertensive heart and chronic kidney disease with heart failure and stage 1 through stage 4 chronic kidney disease, or unspecified chronic kidney disease: Secondary | ICD-10-CM | POA: Diagnosis not present

## 2020-07-01 NOTE — Telephone Encounter (Signed)
Faxed orders to [facility] to continue IV ertapenem for additional 2 weeks (6/9); continue PICC care. Awaiting confirmation fax was received.   Seniya Stoffers Lorita Officer, RN

## 2020-07-01 NOTE — Progress Notes (Signed)
[place] [facility]  CHIEF COMPLAINT:    Follow up for renal abscess  SUBJECTIVE:    [name] is a 87 y.o. male with PMHx as below who presents to the clinic for renal abscess.  Patient was recently hospitalized from 05/15/2020 through 05/28/2020 with perirectal abscess secondary to ESBL status post I&D 05/16/2020.  Also, found to have pyelonephritis and right renal subscapular abscess secondary to ESBL in the setting non-obstructing nephrolithiasis.  Underwent IR drain placement on 05/18/2020 with an interval CT scan done 05/24/2020 which showed drain was in place.  CT also noted two other smaller collections that were reviewed by IR without any further intervention planned given their size.  He had a follow-up visit with IR on 06/10/2020 with CT drain study demonstrating that the perinephric collection with the drain had essentially resolved.  The drain was removed that day.  The residual renal collections were suspicious for small abscesses which had not significantly changed since prior imaging.  Of the residual fluid collections, per IR, only one of the collections could be potentially aspirated or drained but this was deferred to due to the patient overall appearing clinically well.  He was seen for follow-up by my partner, Dr. Linus Salmons, on 06/11/2020.  As the patient had residual findings noted on CT the day before, his antibiotics were extended for an additional 2 weeks through 07/01/2020 with follow-up CT scan performed on 06/29/2020.  Patient had this imaging done which showed a 1.8 x 3.6 cm pericapsular fluid collection, mildly progressive from the most recent prior study now status post drain removal.  Additionally, radiology noted an abnormal appearance of the right kidney, particularly along the posterior right upper pole and posterior interpolar region associated with a 4.5 x 3.4 cm complex fluid collection/abscess in the right renal sinus.  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  Patient also had an issue with his PICC line and so he underwent PICC line exchange with IR on 06/14/2020.  His new PICC line has been functioning well although his wife did note some clear drainage over the last few days.  His home health nurse was out to the house yesterday to change his dressing.  He has no pain or tenderness surrounding his line.  Otherwise he continues to slowly improve subjectively.  He does have days where he feels more tired than others but today he feels pretty good.  He has occasional back pain but no significant dysuria.  He has no fevers or chills.  No rashes.  He sees his urologist Dr. Harlow Asa tomorrow for follow up to discuss stone management.  Please see A&P for the details of today's visit and status of the patient's medical problems.   Patient's Medications  New Prescriptions   No medications on file  Previous Medications   ACETAMINOPHEN (TYLENOL) 500 MG TABLET    Take 500 mg by mouth at bedtime.    AMLODIPINE (NORVASC) 2.5 MG TABLET    Take 1 tablet (2.5 mg total) by mouth daily.   ASCORBIC ACID (VITAMIN C) 1000 MG TABLET    Take 1,000 mg by mouth daily.   BIOTIN 2500 MCG CAPS    Take 2,500 mcg by mouth daily.   CHOLECALCIFEROL (VITAMIN D) 1000 UNITS TABLET    Take 1,000 Units by mouth daily.   DOCUSATE SODIUM (COLACE) 50  MG CAPSULE    Take 50 mg by mouth daily.   FUROSEMIDE (LASIX) 20 MG TABLET    Take 1 tablet (20 mg total) by mouth daily.   GLUCOSAMINE HCL (GLUCOSAMINE PO)    Take 2,000 mg by mouth daily.   HYDROCODONE-ACETAMINOPHEN (NORCO/VICODIN) 5-325 MG TABLET    Take 1 tablet by mouth every 4 (four) hours as needed for severe pain.   ISOSORBIDE MONONITRATE (IMDUR) 30 MG 24 HR TABLET    Take 0.5 tablets (15 mg total) by mouth daily.   MULTIPLE VITAMIN (MULTIVITAMIN) CAPSULE    Take 1 capsule by mouth  daily.    NITROGLYCERIN (NITROSTAT) 0.4 MG SL TABLET    Place 1 tablet (0.4 mg total) under the tongue every 5 (five) minutes as needed.   OMEGA-3 FATTY ACIDS (FISH OIL) 1000 MG CAPS    Take 1,000 mg by mouth daily.   PANTOPRAZOLE (PROTONIX) 40 MG TABLET    Take 1 tablet (40 mg total) by mouth daily at 6 (six) AM.   POLYETHYLENE GLYCOL (MIRALAX / GLYCOLAX) 17 G PACKET    Take 17 g by mouth 2 (two) times daily.   PRAVASTATIN (PRAVACHOL) 20 MG TABLET    TAKE ONE TABLET BY MOUTH ONCE DAILY   VITAMIN B-12 (CYANOCOBALAMIN) 500 MCG TABLET    Take 500 mcg by mouth daily.   WARFARIN (COUMADIN) 2.5 MG TABLET    Take 1 tablet daily or as directed by Coumadin Clinic  Modified Medications   No medications on file  Discontinued Medications   No medications on file      Past Medical History:  Diagnosis Date  . Aortic valve stenosis 04/05/2015   Formatting of this note might be different from the original. Overview:  ECHO 5/15 Formatting of this note might be different from the original. Overview:  Overview:  ECHO 5/15  . Arthritis    "knees, right shoulder" (03/14/2017)  . Atrial fibrillation, persistent ([place]) 09/19/2014  . CAD (coronary artery disease), native coronary artery    Cath 2011 LHC (08/2009):~ Proximal LAD 30%, mid to distal LAD 25%, ostial small D1 75% mid AV groove circumflex 99% been subtotal stenosis, proximal to mid RCA 25-30%, mid RCA 30%, mid PDA 30%, EF 50% with inferior hypokinesis.;    July 2011  PCI and DES to circumflex Dr. Olevia Perches   ETT-Myoview (06/2013):  Inferolateral scar, mild peri-infarct ischemia, EF 40%; Intermediate Risk   ECHO EF 55% 03/2013   . CAD (coronary artery disease), native coronary artery 04/05/2015   Cath 2011 LHC (08/2009):~ Proximal LAD 30%, mid to distal LAD 25%, ostial small D1 75% mid AV groove circumflex 99% been subtotal stenosis, proximal to mid RCA 25-30%, mid RCA 30%, mid PDA 30%, EF 50% with inferior hypokinesis.;    July 2011  PCI and DES to circumflex Dr.  Olevia Perches   ETT-Myoview (06/2013):  Inferolateral scar, mild peri-infarct ischemia, EF 40%; Intermediate Risk   ECHO EF 55% 03/2013    . Cardiac pacemaker in situ 03/14/2017   Biventricular St[employer] inserted 03/14/17 Dr. Lovena Le for second degree heart block   . Chronic combined systolic and diastolic heart failure ([place]) 11/19/2017  . CKD (chronic kidney disease), stage III ([place])   . Gastro-esophageal reflux disease without esophagitis 09/24/2009  . GERD   . History of infection of prosthetic knee 04/05/2015   Treated with debridement and irrigation followed by 6 months of triple antibiotics  . History of kidney stones   . History of peptic  ulcer 1970s?  Marland Kitchen History of prostate cancer    Radical prostatectomy in 2006 Dr. Terance Hart   . Hypertensive heart disease   . Internal hemorrhoids 09/11/2018  . Long term (current) use of anticoagulants 10/06/2011  . Mixed hyperlipidemia   . Moderate persistent asthma without complication 5/46/5035  . Noise effect on both inner ears 08/14/2018  . Obesity (BMI 30-39.9)   . OSA on CPAP   . OSA treated with BiPAP 11/15/2015  . Persistent atrial fibrillation ([place]) 09/19/2014   CHA2DS2VASC score 5  Cardioversion 10/08/2014 was on amiodarone until 2018   . 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)    Initial DVT in 2006 after prostate surgery and had [place] filter placed Bilateral  PE 2013 and placed back on warfarin DVT of right subclavian vein on doppler 10/2012 at time of knee infection   . Personal history of malignant neoplasm of prostate    Radical prostatectomy in 2006 Dr. Terance Hart    . Presbycusis of both ears 08/14/2018  . Severe aortic stenosis   . Severe aortic stenosis 03/18/2018  . Urinary incontinence    MULTIPLE BLADDER SURGERIES - STATES NO URINARY SPHINCTER - PT'S UROLOGIST IS AT [facility]- DR. PETERSON  ( LAST VISIT WAS 09/15/11 )    Social History   Tobacco Use  . Smoking status: Former Smoker    Packs/day: 1.00    Years:  10.00    Pack years: 10.00    Types: Cigarettes    Quit date: 04/27/1961    Years since quitting: 59.2  . Smokeless tobacco: Never Used  Vaping Use  . Vaping Use: Never used  Substance Use Topics  . Alcohol use: No    Alcohol/week: 0.0 standard drinks  . Drug use: No    Family History  Problem Relation Age of Onset  . Melanoma Father   . Melanoma Brother     Allergies  Allergen Reactions  . Darifenacin Nausea Only and Other (See Comments)    dry mouth and dizziness ENABLEX Dizziness Pt denies  . Codeine Other (See Comments)  . Sulfamethoxazole-Trimethoprim Other (See Comments)  . Lisinopril Cough    Pt denies    Review of Systems  All other systems reviewed and are negative. Except as noted in HPI   OBJECTIVE:    Vitals:   07/01/20 1127  BP: 129/72  Pulse: 86   There is no height or weight on file to calculate BMI.  Physical Exam Constitutional:      General: He is not in acute distress.    Appearance: Normal appearance.  Pulmonary:     Effort: Pulmonary effort is normal. No respiratory distress.  Musculoskeletal:     Comments: RIght UE PICC in place.  No warmth or erythema.  No TTP.   Skin:    General: Skin is warm and dry.     Findings: No rash.  Neurological:     General: No focal deficit present.     Mental Status: He is alert and oriented to person, place, and time.  Psychiatric:        Mood and Affect: Mood normal.        Behavior: Behavior normal.      Labs and Microbiology: CBC Latest Ref Rng & Units 05/28/2020 05/27/2020 05/26/2020  WBC 4.0 - 10.5 K/uL 7.2 10.7(H) 9.7  Hemoglobin 13.0 - 17.0 g/dL 10.2(L) 11.6(L) 10.3(L)  Hematocrit 39.0 - 52.0 % 32.4(L) 36.8(L) 32.8(L)  Platelets 150 - 400 K/uL  274 348 322   CMP Latest Ref Rng & Units 05/28/2020 05/27/2020 05/26/2020  Glucose 70 - 99 mg/dL 133(H) 124(H) 113(H)  BUN 8 - 23 mg/dL 16 18 18   Creatinine 0.61 - 1.24 mg/dL 1.09 1.18 1.04  Sodium 135 - 145 mmol/L 139 134(L) 135  Potassium 3.5  - 5.1 mmol/L 4.2 4.2 3.9  Chloride 98 - 111 mmol/L 104 101 102  CO2 22 - 32 mmol/L 28 27 26   Calcium 8.9 - 10.3 mg/dL 9.6 9.5 9.2  Total Protein 6.5 - 8.1 g/dL 5.9(L) 6.7 -  Total Bilirubin 0.3 - 1.2 mg/dL 1.0 1.0 -  Alkaline Phos 38 - 126 U/L 71 77 -  AST 15 - 41 U/L 17 21 -  ALT 0 - 44 U/L 7 8 -     No results found for this or any previous visit (from the past 240 hour(s)).  Imaging: IMPRESSION: Abnormal parenchymal appearance of the right kidney with 19 mm calculus and associated 4.5 cm fluid collection/abscess in the renal sinus. 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 Associated renal neoplasm such as squamous cell carcinoma is difficult to entirely exclude in the posterior right upper pole.  Additional pericapsular fluid collection/abscess along the lateral interpolar right kidney, mildly progressive, status post removal of pigtail drainage catheter.   ASSESSMENT & PLAN:    Renal abscess Patient overall doing well on ertapenem via PICC line without any noted side effects at this time.  CT scan reviewed which shows some progression of the pericapsular fluid collection at site of prior pigtail drainage catheter measuring 1.8 x 3.6 cm.  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  The size of this collection appears relatively unchanged from imaging 3 weeks ago.  Discussed these findings with patient's urologist, Dr. Harlow Asa, in [facility] and feel that the best approach at this time is attempt at drainage.  If this does turn out to be XGP then management would be nephrectomy which would be a challenging surgery for an 87 year old patient. This would likely need to be done at an academic center such as [facility].  Most recent OPAT labs indicate creatinine 1.0.  Also, discussed with patient that fluid collection  may be sterile at this point following 6 weeks of effective IV antibiotics, but only way to determine this would be to stop antibiotics and follow clinically with repeat imaging.  They would prefer pursuit of drainage first which I think is reasonable since this collection has not been drained previously.    Orders Placed This Encounter  Procedures  . IR Radiologist Eval & Mgmt    Standing Status:   Future    Standing Expiration Date:   07/01/2021    Order Specific Question:   Reason for Exam (SYMPTOM  OR DIAGNOSIS REQUIRED)    Answer:   renal abscess    Order Specific Question:   Preferred Imaging Location?    Answer:   [facility] 07/01/2020, 2:54 PM  I spent 40 minutes dedicated to the care of this patient on the date of this encounter to include pre-visit review of records, face-to-face time with the patient discussing renal abscess and post-visit ordering of testing.

## 2020-07-01 NOTE — Telephone Encounter (Signed)
Per Dr. Juleen China,  "talked to his urologist in [place] and recommending IR evaluation/management for repeat drainage of abscess. I put in the IR order for this. can you let his wife know that i talked to Dr Harlow Asa regarding this"  Left voicemail for wife to call office back to discuss next steps. Will need to schedule appointment with IR for abscess drainage. Forwarding to triage pool for follow up.   Naoki Migliaccio Lorita Officer, RN

## 2020-07-01 NOTE — Assessment & Plan Note (Addendum)
Patient overall doing well on ertapenem via PICC line without any noted side effects at this time.  CT scan reviewed which shows some progression of the pericapsular fluid collection at site of prior pigtail drainage catheter measuring 1.8 x 3.6 cm.  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  The size of this collection appears relatively unchanged from imaging 3 weeks ago.  Discussed these findings with patient's urologist, Dr. Harlow Asa, in [facility] and feel that the best approach at this time is attempt at drainage.  If this does turn out to be XGP then management would be nephrectomy which would be a challenging surgery for an 87 year old patient. This would likely need to be done at an academic center such as [facility].  Most recent OPAT labs indicate creatinine 1.0.  Also, discussed with patient that fluid collection may be sterile at this point following 6 weeks of effective IV antibiotics, but only way to determine this would be to stop antibiotics and follow clinically with repeat imaging.  They would prefer pursuit of drainage first which I think is reasonable since this collection has not been drained previously.

## 2020-07-01 NOTE — Telephone Encounter (Signed)
RN spoke with patient's wife, advised her that Dr. Juleen China has been in contact with patient's urologist and is recommending repeat drain of abscess. Explained to pateint's wife that IR should be calling them to schedule the appointment. Advised her to please call with any further questions and to continue following with urology.   Beryle Flock, RN

## 2020-07-01 NOTE — Patient Instructions (Signed)
Thank you for coming to see me today. It was a pleasure seeing you.  To Do: Marland Kitchen Continue antibiotics via PICC line for now . I will talk with your urology doctor about next steps . FOllow up with me in 2 weeks.  If you have any questions or concerns, please do not hesitate to call the office at [phone].  Take Care,   Jule Ser, DO

## 2020-07-02 ENCOUNTER — Telehealth: Payer: Self-pay

## 2020-07-02 DIAGNOSIS — N200 Calculus of kidney: Secondary | ICD-10-CM | POA: Diagnosis not present

## 2020-07-02 NOTE — Telephone Encounter (Signed)
Received call from patient's wife wanting to confirm that patient should still be on IV antibiotics. Per note by Dr. Juleen China yesterday (07/01/20) patient is to continue IV antibiotics through 07/15/20. Patient's wife verbalized understanding and has no further questions.   She states they saw his urologist today and he recommended possible robotic procedure to remove the diseased kidney.   Beryle Flock, RN

## 2020-07-05 DIAGNOSIS — N200 Calculus of kidney: Secondary | ICD-10-CM | POA: Diagnosis not present

## 2020-07-05 DIAGNOSIS — N119 Chronic tubulo-interstitial nephritis, unspecified: Secondary | ICD-10-CM | POA: Diagnosis not present

## 2020-07-05 DIAGNOSIS — I2510 Atherosclerotic heart disease of native coronary artery without angina pectoris: Secondary | ICD-10-CM | POA: Diagnosis not present

## 2020-07-05 DIAGNOSIS — I4821 Permanent atrial fibrillation: Secondary | ICD-10-CM | POA: Diagnosis not present

## 2020-07-05 DIAGNOSIS — I130 Hypertensive heart and chronic kidney disease with heart failure and stage 1 through stage 4 chronic kidney disease, or unspecified chronic kidney disease: Secondary | ICD-10-CM | POA: Diagnosis not present

## 2020-07-05 DIAGNOSIS — K611 Rectal abscess: Secondary | ICD-10-CM | POA: Diagnosis not present

## 2020-07-06 DIAGNOSIS — I4821 Permanent atrial fibrillation: Secondary | ICD-10-CM | POA: Diagnosis not present

## 2020-07-06 DIAGNOSIS — K611 Rectal abscess: Secondary | ICD-10-CM | POA: Diagnosis not present

## 2020-07-06 DIAGNOSIS — I130 Hypertensive heart and chronic kidney disease with heart failure and stage 1 through stage 4 chronic kidney disease, or unspecified chronic kidney disease: Secondary | ICD-10-CM | POA: Diagnosis not present

## 2020-07-06 DIAGNOSIS — N200 Calculus of kidney: Secondary | ICD-10-CM | POA: Diagnosis not present

## 2020-07-06 DIAGNOSIS — I2510 Atherosclerotic heart disease of native coronary artery without angina pectoris: Secondary | ICD-10-CM | POA: Diagnosis not present

## 2020-07-06 DIAGNOSIS — N119 Chronic tubulo-interstitial nephritis, unspecified: Secondary | ICD-10-CM | POA: Diagnosis not present

## 2020-07-07 ENCOUNTER — Ambulatory Visit (INDEPENDENT_AMBULATORY_CARE_PROVIDER_SITE_OTHER): Payer: Medicare Other

## 2020-07-07 DIAGNOSIS — I4819 Other persistent atrial fibrillation: Secondary | ICD-10-CM | POA: Diagnosis not present

## 2020-07-07 DIAGNOSIS — Z86718 Personal history of other venous thrombosis and embolism: Secondary | ICD-10-CM

## 2020-07-07 LAB — POCT INR: INR: 2.6 (ref 2.0–3.0)

## 2020-07-07 NOTE — Progress Notes (Signed)
Remote pacemaker transmission.   

## 2020-07-07 NOTE — Patient Instructions (Signed)
Description   Spoke with Lujean Rave, LPN with [facility] and advised to have pt continue on same dosage of warfarin 1 tablet daily except for 1/2 tablet on Wednesdays and Saturdays. Recheck INR in 1 week by Home Health-[facility] OR SONDRA WILL CHECK INR. Coumadin Clinic [phone].

## 2020-07-12 ENCOUNTER — Telehealth: Payer: Self-pay

## 2020-07-12 DIAGNOSIS — N119 Chronic tubulo-interstitial nephritis, unspecified: Secondary | ICD-10-CM | POA: Diagnosis not present

## 2020-07-12 DIAGNOSIS — I2510 Atherosclerotic heart disease of native coronary artery without angina pectoris: Secondary | ICD-10-CM | POA: Diagnosis not present

## 2020-07-12 DIAGNOSIS — N1832 Chronic kidney disease, stage 3b: Secondary | ICD-10-CM | POA: Diagnosis not present

## 2020-07-12 DIAGNOSIS — I130 Hypertensive heart and chronic kidney disease with heart failure and stage 1 through stage 4 chronic kidney disease, or unspecified chronic kidney disease: Secondary | ICD-10-CM | POA: Diagnosis not present

## 2020-07-12 DIAGNOSIS — I4821 Permanent atrial fibrillation: Secondary | ICD-10-CM | POA: Diagnosis not present

## 2020-07-12 DIAGNOSIS — K611 Rectal abscess: Secondary | ICD-10-CM | POA: Diagnosis not present

## 2020-07-12 DIAGNOSIS — N200 Calculus of kidney: Secondary | ICD-10-CM | POA: Diagnosis not present

## 2020-07-12 DIAGNOSIS — Z789 Other specified health status: Secondary | ICD-10-CM | POA: Diagnosis not present

## 2020-07-12 NOTE — Telephone Encounter (Signed)
-----   Message from Danielle Dess sent at 07/12/2020 11:44 AM EDT ----- Regarding: FW: Drainage of abscess Sheniece Ruggles,   Had this reviewed by our Radiologist. Below is his response.   Thanks,  Lia Foyer  ----- Message ----- From: Criselda Peaches, MD Sent: 07/12/2020  11:27 AM EDT To: Danielle Dess, Ir Procedure Requests Subject: RE: Drainage of abscess                        This is not an out-patient procedure and the 5/24 imaging is not sufficient.  Patient will need to be admitted for at least 23 hour observation (high risk for bacteremia during drain placement) and a complete renal protocol CT abd performed so we can identify what abscess remains or has recurred.   [facility]   ----- Message ----- From: Danielle Dess Sent: 07/12/2020  11:18 AM EDT To: Ir Procedure Requests Subject: Drainage of abscess                            Procedure: Drainage of abscess  Dx: Renal abscess  Ordering: Dr. Jule Ser [phone])  Imaging: Ct abdomen done 06/29/20 in epic  Please review.   Thanks,  Lia Foyer

## 2020-07-12 NOTE — Telephone Encounter (Signed)
Thanks for the update.  Can you reach out to patient and his wife and let them know about radiology recommendations?  If they are okay with observation admission then I can call [facility] and we can work on getting a direct admission coordinated.   Thanks, Mitzi Hansen

## 2020-07-12 NOTE — Telephone Encounter (Signed)
Spoke with patient's wife, who states after speaking with the urologist they have decided not to have drainage tube put back in. If you feel as if patient may need this than please let patient/wife know if this is emergent or can discuss more during appointment on 6/10

## 2020-07-12 NOTE — Telephone Encounter (Signed)
Thanks, this is not urgent so I will talk with him during appointment this week.

## 2020-07-13 DIAGNOSIS — K611 Rectal abscess: Secondary | ICD-10-CM | POA: Diagnosis not present

## 2020-07-13 DIAGNOSIS — I4821 Permanent atrial fibrillation: Secondary | ICD-10-CM | POA: Diagnosis not present

## 2020-07-13 DIAGNOSIS — I2510 Atherosclerotic heart disease of native coronary artery without angina pectoris: Secondary | ICD-10-CM | POA: Diagnosis not present

## 2020-07-13 DIAGNOSIS — I130 Hypertensive heart and chronic kidney disease with heart failure and stage 1 through stage 4 chronic kidney disease, or unspecified chronic kidney disease: Secondary | ICD-10-CM | POA: Diagnosis not present

## 2020-07-13 DIAGNOSIS — N200 Calculus of kidney: Secondary | ICD-10-CM | POA: Diagnosis not present

## 2020-07-13 DIAGNOSIS — N119 Chronic tubulo-interstitial nephritis, unspecified: Secondary | ICD-10-CM | POA: Diagnosis not present

## 2020-07-14 ENCOUNTER — Ambulatory Visit (INDEPENDENT_AMBULATORY_CARE_PROVIDER_SITE_OTHER): Payer: Medicare Other | Admitting: *Deleted

## 2020-07-14 DIAGNOSIS — I2510 Atherosclerotic heart disease of native coronary artery without angina pectoris: Secondary | ICD-10-CM | POA: Diagnosis not present

## 2020-07-14 DIAGNOSIS — I4821 Permanent atrial fibrillation: Secondary | ICD-10-CM | POA: Diagnosis not present

## 2020-07-14 DIAGNOSIS — N119 Chronic tubulo-interstitial nephritis, unspecified: Secondary | ICD-10-CM | POA: Diagnosis not present

## 2020-07-14 DIAGNOSIS — Z86718 Personal history of other venous thrombosis and embolism: Secondary | ICD-10-CM | POA: Diagnosis not present

## 2020-07-14 DIAGNOSIS — K611 Rectal abscess: Secondary | ICD-10-CM | POA: Diagnosis not present

## 2020-07-14 DIAGNOSIS — N200 Calculus of kidney: Secondary | ICD-10-CM | POA: Diagnosis not present

## 2020-07-14 DIAGNOSIS — I4819 Other persistent atrial fibrillation: Secondary | ICD-10-CM | POA: Diagnosis not present

## 2020-07-14 DIAGNOSIS — I130 Hypertensive heart and chronic kidney disease with heart failure and stage 1 through stage 4 chronic kidney disease, or unspecified chronic kidney disease: Secondary | ICD-10-CM | POA: Diagnosis not present

## 2020-07-14 LAB — POCT INR: INR: 2.4 (ref 2.0–3.0)

## 2020-07-15 ENCOUNTER — Telehealth: Payer: Self-pay | Admitting: Cardiology

## 2020-07-15 NOTE — Telephone Encounter (Signed)
Spoke to [place] just now and let her know that we are in [employer] today and tomorrow. She is going to fax these orders here to [facility] for Dr. Bettina Gavia to sign at this time.

## 2020-07-15 NOTE — Telephone Encounter (Signed)
Colletta [place] is calling in to confirm the orders were received for this pt by dr.munley.She states she has not received anything back

## 2020-07-16 ENCOUNTER — Encounter: Payer: Self-pay | Admitting: Internal Medicine

## 2020-07-16 ENCOUNTER — Ambulatory Visit (INDEPENDENT_AMBULATORY_CARE_PROVIDER_SITE_OTHER): Payer: Medicare Other | Admitting: Internal Medicine

## 2020-07-16 ENCOUNTER — Other Ambulatory Visit: Payer: Self-pay

## 2020-07-16 VITALS — BP 126/68 | HR 61 | Temp 97.7°F

## 2020-07-16 DIAGNOSIS — N151 Renal and perinephric abscess: Secondary | ICD-10-CM | POA: Diagnosis not present

## 2020-07-16 DIAGNOSIS — Z452 Encounter for adjustment and management of vascular access device: Secondary | ICD-10-CM

## 2020-07-16 DIAGNOSIS — Z5181 Encounter for therapeutic drug level monitoring: Secondary | ICD-10-CM

## 2020-07-16 NOTE — Assessment & Plan Note (Signed)
Discussed with patient and his wife at length regarding most recent CT findings and plan at this time.  Will extend his ertapenem 1 gm daily for an additional 3 weeks through July 1.  He will see urology at [facility] on June 16 for consideration of nephrectomy which is his preferred management plan.  If he undergoes nephrectomy then would consider him to have had definitive source control and can likely stop antibiotics shortly after surgery.  If he does not have a nephrectomy then would plan to revisit the option of IR guided drain placement into the 4.5 x 3.4 cm renal sinus collection.  RTC in about 2 weeks for follow up planning and will arrange for continued OPAT monitoring.  Will check labs today as well.

## 2020-07-16 NOTE — Patient Instructions (Signed)
Thank you for coming to see me today. It was a pleasure seeing you.  To Do: Labs today Will reorder antibiotics for 3 more weeks Follow up with me in 3 weeks  If you have any questions or concerns, please do not hesitate to call the office at [phone].  Take Care,   Jule Ser, DO

## 2020-07-16 NOTE — Progress Notes (Signed)
[facility]  CHIEF COMPLAINT:    Follow up for renal abscess  SUBJECTIVE:    [name] is a 87 y.o. male with PMHx as below who presents to the clinic for renal abscess.   Interval Hx: Patient is in for his routine follow-up visit.  Previously seen by me 07/01/2020.  I discussed his most recent CT findings from 5/24 with his urologist and we were considering having the patient return to IR for attempted drainage of the 4.5 cm fluid collection in the renal sinus.  However, patient subsequently had a follow-up visit with his urologist and decided to not pursue further drainage at this time.  Interventional radiology also reviewed his case and decided that if drainage was attempted he would need to be admitted for 24 hours with further imaging and observation due to the complexity.  He actually completed his ertapenem yesterday on 6/9 after previously having his antibiotics extended on 2 separate occasions.  He has now completed apprximately 8 weeks of therapy from his initial IR drain placement on 4/12 (subsequently removed).  He continues to overall improve without any concerning signs or symptoms of infection at this time.  He has no fevers or chills.  He has no dysuria.  He has occasional back pain.  He does continue to feel fatigued but is improving.  He has no rashes.  He has been referred to [facility] for urology evaluation to consider a minimally invasive nephrectomy and is scheduled to see them on June 16.  HPI taken from prior notes 5/26: Patient was recently hospitalized from 05/15/2020 through 05/28/2020 with perirectal abscess secondary to ESBL status post I&D 05/16/2020.  Also, found to have pyelonephritis and right renal subscapular abscess secondary to ESBL in the setting non-obstructing nephrolithiasis.  Underwent IR drain placement on 05/18/2020 with an interval CT scan done 05/24/2020 which showed drain was in place.  CT also noted two other smaller  collections that were reviewed by IR without any further intervention planned given their size.  He had a follow-up visit with IR on 06/10/2020 with CT drain study demonstrating that the perinephric collection with the drain had essentially resolved.  The drain was removed that day.  The residual renal collections were suspicious for small abscesses which had not significantly changed since prior imaging.  Of the residual fluid collections, per IR, only one of the collections could be potentially aspirated or drained but this was deferred to due to the patient overall appearing clinically well.  He was seen for follow-up by my partner, Dr. Linus Salmons, on 06/11/2020.  As the patient had residual findings noted on CT the day before, his antibiotics were extended for an additional 2 weeks through 07/01/2020 with follow-up CT scan performed on 06/29/2020.  Patient had this imaging done which showed a 1.8 x 3.6 cm pericapsular fluid collection, mildly progressive from the most recent prior study now status post drain removal.  Additionally, radiology noted an abnormal appearance of the right kidney, particularly along the posterior right upper pole and posterior interpolar region associated with a 4.5 x 3.4 cm complex fluid collection/abscess in the right renal sinus.  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  Please see A&P for the details of today's visit and status of the patient's medical problems.   Patient's Medications  New Prescriptions   No medications  on file  Previous Medications   ACETAMINOPHEN (TYLENOL) 500 MG TABLET    Take 500 mg by mouth at bedtime.    AMLODIPINE (NORVASC) 2.5 MG TABLET    Take 1 tablet (2.5 mg total) by mouth daily.   ASCORBIC ACID (VITAMIN C) 1000 MG TABLET    Take 1,000 mg by mouth daily.   BIOTIN 2500 MCG CAPS    Take 2,500 mcg by mouth daily.    CHOLECALCIFEROL (VITAMIN D) 1000 UNITS TABLET    Take 1,000 Units by mouth daily.   DOCUSATE SODIUM (COLACE) 50 MG CAPSULE    Take 50 mg by mouth daily.   FUROSEMIDE (LASIX) 20 MG TABLET    Take 1 tablet (20 mg total) by mouth daily.   GLUCOSAMINE HCL (GLUCOSAMINE PO)    Take 2,000 mg by mouth daily.   HYDROCODONE-ACETAMINOPHEN (NORCO/VICODIN) 5-325 MG TABLET    Take 1 tablet by mouth every 4 (four) hours as needed for severe pain.   ISOSORBIDE MONONITRATE (IMDUR) 30 MG 24 HR TABLET    Take 0.5 tablets (15 mg total) by mouth daily.   MULTIPLE VITAMIN (MULTIVITAMIN) CAPSULE    Take 1 capsule by mouth daily.    NITROGLYCERIN (NITROSTAT) 0.4 MG SL TABLET    Place 1 tablet (0.4 mg total) under the tongue every 5 (five) minutes as needed.   OMEGA-3 FATTY ACIDS (FISH OIL) 1000 MG CAPS    Take 1,000 mg by mouth daily.   PANTOPRAZOLE (PROTONIX) 40 MG TABLET    Take 1 tablet (40 mg total) by mouth daily at 6 (six) AM.   POLYETHYLENE GLYCOL (MIRALAX / GLYCOLAX) 17 G PACKET    Take 17 g by mouth 2 (two) times daily.   PRAVASTATIN (PRAVACHOL) 20 MG TABLET    TAKE ONE TABLET BY MOUTH ONCE DAILY   VITAMIN B-12 (CYANOCOBALAMIN) 500 MCG TABLET    Take 500 mcg by mouth daily.   WARFARIN (COUMADIN) 2.5 MG TABLET    Take 1 tablet daily or as directed by [facility]  Modified Medications   No medications on file  Discontinued Medications   No medications on file      Past Medical History:  Diagnosis Date   Aortic valve stenosis 04/05/2015   Formatting of this note might be different from the original. Overview:  ECHO 5/15 Formatting of this note might be different from the original. Overview:  Overview:  ECHO 5/15   Arthritis    "knees, right shoulder" (03/14/2017)   Atrial fibrillation, persistent ([place]) 09/19/2014   CAD (coronary artery disease), native coronary artery    Cath 2011 LHC (08/2009):~ Proximal LAD 30%, mid to distal LAD 25%, ostial small D1 75% mid AV groove circumflex 99% been subtotal  stenosis, proximal to mid RCA 25-30%, mid RCA 30%, mid PDA 30%, EF 50% with inferior hypokinesis.;    July 2011  PCI and DES to circumflex Dr. Olevia Perches   ETT-Myoview (06/2013):  Inferolateral scar, mild peri-infarct ischemia, EF 40%; Intermediate Risk   ECHO EF 55% 03/2013    CAD (coronary artery disease), native coronary artery 04/05/2015   Cath 2011 LHC (08/2009):~ Proximal LAD 30%, mid to distal LAD 25%, ostial small D1 75% mid AV groove circumflex 99% been subtotal stenosis, proximal to mid RCA 25-30%, mid RCA 30%, mid PDA 30%, EF 50% with inferior hypokinesis.;    July 2011  PCI and DES to circumflex Dr. Olevia Perches   ETT-Myoview (06/2013):  Inferolateral scar, mild peri-infarct ischemia, EF 40%; Intermediate Risk  ECHO EF 55% 03/2013     Cardiac pacemaker in situ 03/14/2017   Biventricular [employer] inserted 03/14/17 Dr. Lovena Le for second degree heart block    Chronic combined systolic and diastolic heart failure ([place]) 11/19/2017   CKD (chronic kidney disease), stage III ([place])    Gastro-esophageal reflux disease without esophagitis 09/24/2009   GERD    History of infection of prosthetic knee 04/05/2015   Treated with debridement and irrigation followed by 6 months of triple antibiotics   History of kidney stones    History of peptic ulcer 1970s?   History of prostate cancer    Radical prostatectomy in 2006 Dr. Terance Hart    Hypertensive heart disease    Internal hemorrhoids 09/11/2018   Long term (current) use of anticoagulants 10/06/2011   Mixed hyperlipidemia    Moderate persistent asthma without complication 2/42/6834   Noise effect on both inner ears 08/14/2018   Obesity (BMI 30-39.9)    OSA on CPAP    OSA treated with BiPAP 11/15/2015   Persistent atrial fibrillation ([place]) 09/19/2014   CHA2DS2VASC score 5  Cardioversion 10/08/2014 was on amiodarone until 2018    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)    Initial DVT in 2006 after prostate surgery and had [place] filter  placed Bilateral  PE 2013 and placed back on warfarin DVT of right subclavian vein on doppler 10/2012 at time of knee infection    Personal history of malignant neoplasm of prostate    Radical prostatectomy in 2006 Dr. Terance Hart     Presbycusis of both ears 08/14/2018   Severe aortic stenosis    Severe aortic stenosis 03/18/2018   Urinary incontinence    MULTIPLE BLADDER SURGERIES - STATES NO URINARY SPHINCTER - PT'S UROLOGIST IS AT [facility]- DR. PETERSON  ( LAST VISIT WAS 09/15/11 )    Social History   Tobacco Use   Smoking status: Former    Packs/day: 1.00    Years: 10.00    Pack years: 10.00    Types: Cigarettes    Quit date: 04/27/1961    Years since quitting: 59.2   Smokeless tobacco: Never  Vaping Use   Vaping Use: Never used  Substance Use Topics   Alcohol use: No    Alcohol/week: 0.0 standard drinks   Drug use: No    Family History  Problem Relation Age of Onset   Melanoma Father    Melanoma Brother     Allergies  Allergen Reactions   Darifenacin Nausea Only and Other (See Comments)    dry mouth and dizziness ENABLEX Dizziness Pt denies   Codeine Other (See Comments)   Sulfamethoxazole-Trimethoprim Other (See Comments)   Lisinopril Cough    Pt denies    Review of Systems  Constitutional:  Negative for chills and fever.  Respiratory: Negative.    Cardiovascular: Negative.   Gastrointestinal: Negative.   Genitourinary:  Negative for dysuria and frequency.  Musculoskeletal:  Positive for back pain.  Skin: Negative.     OBJECTIVE:    Vitals:   07/16/20 1455  BP: 126/68  Pulse: 61  Temp: 97.7 F (36.5 C)  SpO2: 97%   There is no height or weight on file to calculate BMI.  Physical Exam Constitutional:      General: He is not in acute distress.    Appearance: Normal appearance.  Eyes:     Extraocular Movements: Extraocular movements intact.     Conjunctiva/sclera: Conjunctivae normal.  Pulmonary:  Effort: Pulmonary effort is normal. No  respiratory distress.  Musculoskeletal:        General: Normal range of motion.     Comments: PICC line in place.  Skin:    General: Skin is warm and dry.     Findings: No rash.  Neurological:     General: No focal deficit present.     Mental Status: He is alert and oriented to person, place, and time.  Psychiatric:        Mood and Affect: Mood normal.        Behavior: Behavior normal.     Labs and Microbiology: CBC Latest Ref Rng & Units 05/28/2020 05/27/2020 05/26/2020  WBC 4.0 - 10.5 K/uL 7.2 10.7(H) 9.7  Hemoglobin 13.0 - 17.0 g/dL 10.2(L) 11.6(L) 10.3(L)  Hematocrit 39.0 - 52.0 % 32.4(L) 36.8(L) 32.8(L)  Platelets 150 - 400 K/uL 274 348 322   CMP Latest Ref Rng & Units 05/28/2020 05/27/2020 05/26/2020  Glucose 70 - 99 mg/dL 133(H) 124(H) 113(H)  BUN 8 - 23 mg/dL 16 18 18   Creatinine 0.61 - 1.24 mg/dL 1.09 1.18 1.04  Sodium 135 - 145 mmol/L 139 134(L) 135  Potassium 3.5 - 5.1 mmol/L 4.2 4.2 3.9  Chloride 98 - 111 mmol/L 104 101 102  CO2 22 - 32 mmol/L 28 27 26   Calcium 8.9 - 10.3 mg/dL 9.6 9.5 9.2  Total Protein 6.5 - 8.1 g/dL 5.9(L) 6.7 -  Total Bilirubin 0.3 - 1.2 mg/dL 1.0 1.0 -  Alkaline Phos 38 - 126 U/L 71 77 -  AST 15 - 41 U/L 17 21 -  ALT 0 - 44 U/L 7 8 -      Imaging: IMPRESSION: Abnormal parenchymal appearance of the right kidney with 19 mm calculus and associated 4.5 cm fluid collection/abscess in the renal sinus. 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 Associated renal neoplasm such as squamous cell carcinoma is difficult to entirely exclude in the posterior right upper pole.   Additional pericapsular fluid collection/abscess along the lateral interpolar right kidney, mildly progressive, status post removal of pigtail drainage catheter.   ASSESSMENT & PLAN:    Renal abscess Discussed with patient and his wife at length regarding most recent CT findings and plan at this time.  Will extend his ertapenem 1 gm daily for an additional 3 weeks through  July 1.  He will see urology at [facility] on June 16 for consideration of nephrectomy which is his preferred management plan.  If he undergoes nephrectomy then would consider him to have had definitive source control and can likely stop antibiotics shortly after surgery.  If he does not have a nephrectomy then would plan to revisit the option of IR guided drain placement into the 4.5 x 3.4 cm renal sinus collection.  RTC in about 2 weeks for follow up planning and will arrange for continued OPAT monitoring.  Will check labs today as well.    Orders Placed This Encounter  Procedures   CBC   Basic metabolic panel    Order Specific Question:   Has the patient fasted?    Answer:   No   Sedimentation rate   C-reactive protein       Raynelle Highland for Infectious Disease [facility] 07/16/2020, 5:04 PM  I spent 30 minutes dedicated to the care of this patient on the date of this encounter to include pre-visit review of records, face-to-face time with the patient discussing renal abscess, and post-visit ordering  of testing.

## 2020-07-17 LAB — BASIC METABOLIC PANEL
BUN: 21 mg/dL (ref 7–25)
CO2: 24 mmol/L (ref 20–32)
Calcium: 9.6 mg/dL (ref 8.6–10.3)
Chloride: 105 mmol/L (ref 98–110)
Creat: 1.07 mg/dL (ref 0.70–1.11)
Glucose, Bld: 106 mg/dL — ABNORMAL HIGH (ref 65–99)
Potassium: 4.2 mmol/L (ref 3.5–5.3)
Sodium: 138 mmol/L (ref 135–146)

## 2020-07-17 LAB — C-REACTIVE PROTEIN: CRP: 14.9 mg/L — ABNORMAL HIGH (ref <8.0)

## 2020-07-17 LAB — CBC
HCT: 38.1 % — ABNORMAL LOW (ref 38.5–50.0)
Hemoglobin: 12.1 g/dL — ABNORMAL LOW (ref 13.2–17.1)
MCH: 25.9 pg — ABNORMAL LOW (ref 27.0–33.0)
MCHC: 31.8 g/dL — ABNORMAL LOW (ref 32.0–36.0)
MCV: 81.4 fL (ref 80.0–100.0)
MPV: 10.2 fL (ref 7.5–12.5)
Platelets: 252 10*3/uL (ref 140–400)
RBC: 4.68 10*6/uL (ref 4.20–5.80)
RDW: 16.2 % — ABNORMAL HIGH (ref 11.0–15.0)
WBC: 10 10*3/uL (ref 3.8–10.8)

## 2020-07-17 LAB — SEDIMENTATION RATE: Sed Rate: 45 mm/h — ABNORMAL HIGH (ref 0–20)

## 2020-07-19 ENCOUNTER — Telehealth: Payer: Self-pay

## 2020-07-19 ENCOUNTER — Ambulatory Visit (INDEPENDENT_AMBULATORY_CARE_PROVIDER_SITE_OTHER): Payer: Medicare Other | Admitting: *Deleted

## 2020-07-19 DIAGNOSIS — I2510 Atherosclerotic heart disease of native coronary artery without angina pectoris: Secondary | ICD-10-CM | POA: Diagnosis not present

## 2020-07-19 DIAGNOSIS — N119 Chronic tubulo-interstitial nephritis, unspecified: Secondary | ICD-10-CM | POA: Diagnosis not present

## 2020-07-19 DIAGNOSIS — Z86718 Personal history of other venous thrombosis and embolism: Secondary | ICD-10-CM | POA: Diagnosis not present

## 2020-07-19 DIAGNOSIS — I130 Hypertensive heart and chronic kidney disease with heart failure and stage 1 through stage 4 chronic kidney disease, or unspecified chronic kidney disease: Secondary | ICD-10-CM | POA: Diagnosis not present

## 2020-07-19 DIAGNOSIS — Z789 Other specified health status: Secondary | ICD-10-CM | POA: Diagnosis not present

## 2020-07-19 DIAGNOSIS — N1832 Chronic kidney disease, stage 3b: Secondary | ICD-10-CM | POA: Diagnosis not present

## 2020-07-19 DIAGNOSIS — Z5181 Encounter for therapeutic drug level monitoring: Secondary | ICD-10-CM | POA: Diagnosis not present

## 2020-07-19 DIAGNOSIS — I4821 Permanent atrial fibrillation: Secondary | ICD-10-CM | POA: Diagnosis not present

## 2020-07-19 DIAGNOSIS — I4819 Other persistent atrial fibrillation: Secondary | ICD-10-CM | POA: Diagnosis not present

## 2020-07-19 DIAGNOSIS — N200 Calculus of kidney: Secondary | ICD-10-CM | POA: Diagnosis not present

## 2020-07-19 DIAGNOSIS — K611 Rectal abscess: Secondary | ICD-10-CM | POA: Diagnosis not present

## 2020-07-19 LAB — POCT INR: INR: 2.2 (ref 2.0–3.0)

## 2020-07-19 NOTE — Patient Instructions (Signed)
Description   Spoke with Lujean Rave, RN with [facility] and advised to have pt continue on same dosage of warfarin 1 tablet daily except for 1/2 tablet on Wednesdays and Saturdays. Recheck INR in 2 weeks by Home Health[facility] OR [facility] WILL CHECK INR. [facility]in Clinic [phone].

## 2020-07-19 NOTE — Telephone Encounter (Signed)
Patient made aware of results. No concerns voiced.  [name]

## 2020-07-19 NOTE — Telephone Encounter (Signed)
-----   Message from Mignon Pine, DO sent at 07/19/2020  2:48 PM EDT ----- Please let patient know that labs were stable and no concerning features.  I will follow up with him in a couple weeks after he has a chance to go to the urology doctor at [facility] for his second opinion.  Thanks, Mitzi Hansen

## 2020-07-22 DIAGNOSIS — N151 Renal and perinephric abscess: Secondary | ICD-10-CM | POA: Diagnosis not present

## 2020-07-23 DIAGNOSIS — I4821 Permanent atrial fibrillation: Secondary | ICD-10-CM | POA: Diagnosis not present

## 2020-07-23 DIAGNOSIS — N119 Chronic tubulo-interstitial nephritis, unspecified: Secondary | ICD-10-CM | POA: Diagnosis not present

## 2020-07-23 DIAGNOSIS — N200 Calculus of kidney: Secondary | ICD-10-CM | POA: Diagnosis not present

## 2020-07-23 DIAGNOSIS — K611 Rectal abscess: Secondary | ICD-10-CM | POA: Diagnosis not present

## 2020-07-23 DIAGNOSIS — I2510 Atherosclerotic heart disease of native coronary artery without angina pectoris: Secondary | ICD-10-CM | POA: Diagnosis not present

## 2020-07-23 DIAGNOSIS — I130 Hypertensive heart and chronic kidney disease with heart failure and stage 1 through stage 4 chronic kidney disease, or unspecified chronic kidney disease: Secondary | ICD-10-CM | POA: Diagnosis not present

## 2020-07-26 ENCOUNTER — Emergency Department (HOSPITAL_COMMUNITY): Payer: Medicare Other

## 2020-07-26 ENCOUNTER — Telehealth: Payer: Self-pay

## 2020-07-26 ENCOUNTER — Other Ambulatory Visit: Payer: Self-pay

## 2020-07-26 ENCOUNTER — Encounter (HOSPITAL_COMMUNITY): Payer: Self-pay | Admitting: Emergency Medicine

## 2020-07-26 ENCOUNTER — Emergency Department (HOSPITAL_COMMUNITY)
Admission: EM | Admit: 2020-07-26 | Discharge: 2020-07-27 | Disposition: A | Discharge status: Home / Self Care | Payer: Medicare Other | Attending: Physician Assistant | Admitting: Physician Assistant

## 2020-07-26 DIAGNOSIS — Z9079 Acquired absence of other genital organ(s): Secondary | ICD-10-CM | POA: Diagnosis not present

## 2020-07-26 DIAGNOSIS — Z96653 Presence of artificial knee joint, bilateral: Secondary | ICD-10-CM | POA: Insufficient documentation

## 2020-07-26 DIAGNOSIS — I5042 Chronic combined systolic (congestive) and diastolic (congestive) heart failure: Secondary | ICD-10-CM | POA: Insufficient documentation

## 2020-07-26 DIAGNOSIS — T82598A Other mechanical complication of other cardiac and vascular devices and implants, initial encounter: Secondary | ICD-10-CM | POA: Diagnosis present

## 2020-07-26 DIAGNOSIS — I2510 Atherosclerotic heart disease of native coronary artery without angina pectoris: Secondary | ICD-10-CM | POA: Insufficient documentation

## 2020-07-26 DIAGNOSIS — X58XXXA Exposure to other specified factors, initial encounter: Secondary | ICD-10-CM | POA: Diagnosis not present

## 2020-07-26 DIAGNOSIS — I10 Essential (primary) hypertension: Secondary | ICD-10-CM | POA: Diagnosis not present

## 2020-07-26 DIAGNOSIS — N281 Cyst of kidney, acquired: Secondary | ICD-10-CM | POA: Diagnosis not present

## 2020-07-26 DIAGNOSIS — D62 Acute posthemorrhagic anemia: Secondary | ICD-10-CM | POA: Diagnosis not present

## 2020-07-26 DIAGNOSIS — I4821 Permanent atrial fibrillation: Secondary | ICD-10-CM | POA: Diagnosis not present

## 2020-07-26 DIAGNOSIS — N151 Renal and perinephric abscess: Secondary | ICD-10-CM | POA: Diagnosis not present

## 2020-07-26 DIAGNOSIS — Z87891 Personal history of nicotine dependence: Secondary | ICD-10-CM | POA: Diagnosis not present

## 2020-07-26 DIAGNOSIS — Z5321 Procedure and treatment not carried out due to patient leaving prior to being seen by health care provider: Secondary | ICD-10-CM | POA: Insufficient documentation

## 2020-07-26 DIAGNOSIS — T827XXA Infection and inflammatory reaction due to other cardiac and vascular devices, implants and grafts, initial encounter: Secondary | ICD-10-CM | POA: Insufficient documentation

## 2020-07-26 DIAGNOSIS — I700 Atherosclerosis of aorta: Secondary | ICD-10-CM | POA: Diagnosis not present

## 2020-07-26 DIAGNOSIS — I130 Hypertensive heart and chronic kidney disease with heart failure and stage 1 through stage 4 chronic kidney disease, or unspecified chronic kidney disease: Secondary | ICD-10-CM | POA: Insufficient documentation

## 2020-07-26 DIAGNOSIS — T82594A Other mechanical complication of infusion catheter, initial encounter: Secondary | ICD-10-CM | POA: Diagnosis not present

## 2020-07-26 DIAGNOSIS — Z950 Presence of cardiac pacemaker: Secondary | ICD-10-CM | POA: Insufficient documentation

## 2020-07-26 DIAGNOSIS — J45909 Unspecified asthma, uncomplicated: Secondary | ICD-10-CM | POA: Insufficient documentation

## 2020-07-26 DIAGNOSIS — Z955 Presence of coronary angioplasty implant and graft: Secondary | ICD-10-CM | POA: Insufficient documentation

## 2020-07-26 DIAGNOSIS — Z452 Encounter for adjustment and management of vascular access device: Secondary | ICD-10-CM | POA: Diagnosis not present

## 2020-07-26 DIAGNOSIS — N12 Tubulo-interstitial nephritis, not specified as acute or chronic: Secondary | ICD-10-CM | POA: Diagnosis not present

## 2020-07-26 DIAGNOSIS — N183 Chronic kidney disease, stage 3 unspecified: Secondary | ICD-10-CM | POA: Diagnosis not present

## 2020-07-26 DIAGNOSIS — T80219A Unspecified infection due to central venous catheter, initial encounter: Secondary | ICD-10-CM | POA: Diagnosis not present

## 2020-07-26 DIAGNOSIS — N200 Calculus of kidney: Secondary | ICD-10-CM | POA: Diagnosis not present

## 2020-07-26 DIAGNOSIS — Z789 Other specified health status: Secondary | ICD-10-CM | POA: Diagnosis not present

## 2020-07-26 DIAGNOSIS — Y713 Surgical instruments, materials and cardiovascular devices (including sutures) associated with adverse incidents: Secondary | ICD-10-CM | POA: Insufficient documentation

## 2020-07-26 DIAGNOSIS — K8020 Calculus of gallbladder without cholecystitis without obstruction: Secondary | ICD-10-CM | POA: Diagnosis not present

## 2020-07-26 DIAGNOSIS — K611 Rectal abscess: Secondary | ICD-10-CM | POA: Diagnosis not present

## 2020-07-26 DIAGNOSIS — N1832 Chronic kidney disease, stage 3b: Secondary | ICD-10-CM | POA: Diagnosis not present

## 2020-07-26 DIAGNOSIS — N119 Chronic tubulo-interstitial nephritis, unspecified: Secondary | ICD-10-CM | POA: Diagnosis not present

## 2020-07-26 DIAGNOSIS — Z8546 Personal history of malignant neoplasm of prostate: Secondary | ICD-10-CM | POA: Diagnosis not present

## 2020-07-26 DIAGNOSIS — M19012 Primary osteoarthritis, left shoulder: Secondary | ICD-10-CM | POA: Diagnosis not present

## 2020-07-26 DIAGNOSIS — M19011 Primary osteoarthritis, right shoulder: Secondary | ICD-10-CM | POA: Diagnosis not present

## 2020-07-26 LAB — BASIC METABOLIC PANEL
Anion gap: 8 (ref 5–15)
BUN: 16 mg/dL (ref 8–23)
CO2: 25 mmol/L (ref 22–32)
Calcium: 9.7 mg/dL (ref 8.9–10.3)
Chloride: 105 mmol/L (ref 98–111)
Creatinine, Ser: 1.24 mg/dL (ref 0.61–1.24)
GFR, Estimated: 56 mL/min — ABNORMAL LOW (ref >60)
Glucose, Bld: 116 mg/dL — ABNORMAL HIGH (ref 70–99)
Potassium: 3.8 mmol/L (ref 3.5–5.1)
Sodium: 138 mmol/L (ref 135–145)

## 2020-07-26 LAB — CBC WITH DIFFERENTIAL/PLATELET
Abs Immature Granulocytes: 0.01 10*3/uL (ref 0.00–0.07)
Basophils Absolute: 0.1 10*3/uL (ref 0.0–0.1)
Basophils Relative: 1 %
Eosinophils Absolute: 0.8 10*3/uL — ABNORMAL HIGH (ref 0.0–0.5)
Eosinophils Relative: 11 %
HCT: 41.7 % (ref 39.0–52.0)
Hemoglobin: 12.7 g/dL — ABNORMAL LOW (ref 13.0–17.0)
Immature Granulocytes: 0 %
Lymphocytes Relative: 20 %
Lymphs Abs: 1.3 10*3/uL (ref 0.7–4.0)
MCH: 25.4 pg — ABNORMAL LOW (ref 26.0–34.0)
MCHC: 30.5 g/dL (ref 30.0–36.0)
MCV: 83.4 fL (ref 80.0–100.0)
Monocytes Absolute: 0.5 10*3/uL (ref 0.1–1.0)
Monocytes Relative: 8 %
Neutro Abs: 4 10*3/uL (ref 1.7–7.7)
Neutrophils Relative %: 60 %
Platelets: 247 10*3/uL (ref 150–400)
RBC: 5 MIL/uL (ref 4.22–5.81)
RDW: 16.6 % — ABNORMAL HIGH (ref 11.5–15.5)
WBC: 6.7 10*3/uL (ref 4.0–10.5)
nRBC: 0 % (ref 0.0–0.2)

## 2020-07-26 NOTE — ED Notes (Signed)
Patient is going to follow up with primary doctor in the morning

## 2020-07-26 NOTE — Telephone Encounter (Signed)
Received multiple voicemails from patient's wife and home health nurse stating that PICC site is red, tender, inflamed, painful, and there is drainage present.   Nurse states she did a dressing change today and drew labs, PICC had good blood return. She also states patient received his IV antibiotics this morning. Staff has already notified patient's wife that she needs to take patient to emergency department to have PICC evaluated   Beryle Flock, RN

## 2020-07-26 NOTE — Telephone Encounter (Signed)
Patient's wife called stating patient's picc line has came out about 2.5 cm, patient now having some drainage and blood coming from the picc line. Patient's wife stated patient arm is also very sore. Patient's wife instructed to take the patient to the ED.  Chesnie Capell T Brooks Sailors

## 2020-07-26 NOTE — ED Triage Notes (Addendum)
Per ems, pt from home. Pt with wife. Pt had picc line placed in April from rt kidney infection, this is his 2nd line placement since. Pt last had picc line dressing changed this morning and antibiotic infusion at 10am. Pt started reporting pain, redness to site this afternoon with drainage. PICC line about 2cm displaced. Pt A&Ox4. Recent fall with broken ribs, reporting rib pain. EMS BP 160/100, HR 62, SpO2 98%.

## 2020-07-26 NOTE — ED Provider Notes (Signed)
Emergency Medicine Provider Triage Evaluation Note  [name] , a 87 y.o. male  was evaluated in triage.  Pt complains of PICC line issues. He is followed by ID and has antibiotic infusions for kidney infection.  His he had his dressing changed this morning by PICC line nurse, after that he got his antibiotic infusion on day CT scan his wife noticed redness around the insertion site. He and her reportedly called ID who recommended that he come here for evaluation. Reportedly PICC nurse felt that the line was pulled out about 2 cm..  Review of Systems  Positive: Picc line redness Negative: fevers  Physical Exam  BP 131/78   Pulse (!) 59   Temp 97.7 F (36.5 C) (Oral)   Resp 18   Ht 5\' 8"  (1.727 m)   Wt 93.4 kg   SpO2 99%   BMI 31.32 kg/m  Gen:   Awake, no distress   Resp:  Normal effort  MSK:   Moves extremities without difficulty except for limited mobility in right elbow due to picc line pain Other:  2+ right radial pulse. There is scant yellow discharge surrounding PICC line insertion site.  Medical Decision Making  Medically screening exam initiated at 8:29 PM.  Appropriate orders placed.  Laiden P Dansereau was informed that the remainder of the evaluation will be completed by another provider, this initial triage assessment does not replace that evaluation, and the importance of remaining in the ED until their evaluation is complete.  Note: Portions of this report may have been transcribed using voice recognition software. Every effort was made to ensure accuracy; however, inadvertent computerized transcription errors may be present    Ollen Gross 07/26/20 2031    Daleen Bo, MD 07/27/20 0028

## 2020-07-27 ENCOUNTER — Other Ambulatory Visit (HOSPITAL_COMMUNITY): Payer: Self-pay | Admitting: Internal Medicine

## 2020-07-27 ENCOUNTER — Emergency Department (HOSPITAL_COMMUNITY)
Admission: EM | Admit: 2020-07-27 | Discharge: 2020-07-28 | Disposition: A | Discharge status: Home / Self Care | Payer: Medicare Other | Source: Home / Self Care | Attending: Emergency Medicine | Admitting: Emergency Medicine

## 2020-07-27 DIAGNOSIS — E669 Obesity, unspecified: Secondary | ICD-10-CM | POA: Diagnosis not present

## 2020-07-27 DIAGNOSIS — M150 Primary generalized (osteo)arthritis: Secondary | ICD-10-CM | POA: Diagnosis not present

## 2020-07-27 DIAGNOSIS — N119 Chronic tubulo-interstitial nephritis, unspecified: Secondary | ICD-10-CM | POA: Diagnosis not present

## 2020-07-27 DIAGNOSIS — Z950 Presence of cardiac pacemaker: Secondary | ICD-10-CM | POA: Insufficient documentation

## 2020-07-27 DIAGNOSIS — K648 Other hemorrhoids: Secondary | ICD-10-CM | POA: Diagnosis not present

## 2020-07-27 DIAGNOSIS — K5900 Constipation, unspecified: Secondary | ICD-10-CM | POA: Diagnosis not present

## 2020-07-27 DIAGNOSIS — Z6829 Body mass index (BMI) 29.0-29.9, adult: Secondary | ICD-10-CM | POA: Diagnosis not present

## 2020-07-27 DIAGNOSIS — J4540 Moderate persistent asthma, uncomplicated: Secondary | ICD-10-CM | POA: Diagnosis not present

## 2020-07-27 DIAGNOSIS — Z955 Presence of coronary angioplasty implant and graft: Secondary | ICD-10-CM | POA: Insufficient documentation

## 2020-07-27 DIAGNOSIS — J45909 Unspecified asthma, uncomplicated: Secondary | ICD-10-CM | POA: Insufficient documentation

## 2020-07-27 DIAGNOSIS — H9113 Presbycusis, bilateral: Secondary | ICD-10-CM | POA: Diagnosis not present

## 2020-07-27 DIAGNOSIS — N200 Calculus of kidney: Secondary | ICD-10-CM | POA: Diagnosis not present

## 2020-07-27 DIAGNOSIS — Z452 Encounter for adjustment and management of vascular access device: Secondary | ICD-10-CM

## 2020-07-27 DIAGNOSIS — T827XXA Infection and inflammatory reaction due to other cardiac and vascular devices, implants and grafts, initial encounter: Secondary | ICD-10-CM | POA: Insufficient documentation

## 2020-07-27 DIAGNOSIS — T80219A Unspecified infection due to central venous catheter, initial encounter: Secondary | ICD-10-CM | POA: Diagnosis not present

## 2020-07-27 DIAGNOSIS — N1832 Chronic kidney disease, stage 3b: Secondary | ICD-10-CM | POA: Diagnosis not present

## 2020-07-27 DIAGNOSIS — D62 Acute posthemorrhagic anemia: Secondary | ICD-10-CM | POA: Diagnosis not present

## 2020-07-27 DIAGNOSIS — G4733 Obstructive sleep apnea (adult) (pediatric): Secondary | ICD-10-CM | POA: Diagnosis not present

## 2020-07-27 DIAGNOSIS — Z87891 Personal history of nicotine dependence: Secondary | ICD-10-CM | POA: Insufficient documentation

## 2020-07-27 DIAGNOSIS — Z9079 Acquired absence of other genital organ(s): Secondary | ICD-10-CM | POA: Diagnosis not present

## 2020-07-27 DIAGNOSIS — R32 Unspecified urinary incontinence: Secondary | ICD-10-CM | POA: Diagnosis not present

## 2020-07-27 DIAGNOSIS — I4821 Permanent atrial fibrillation: Secondary | ICD-10-CM | POA: Diagnosis not present

## 2020-07-27 DIAGNOSIS — Z79899 Other long term (current) drug therapy: Secondary | ICD-10-CM | POA: Insufficient documentation

## 2020-07-27 DIAGNOSIS — K611 Rectal abscess: Secondary | ICD-10-CM | POA: Diagnosis not present

## 2020-07-27 DIAGNOSIS — Z86718 Personal history of other venous thrombosis and embolism: Secondary | ICD-10-CM | POA: Insufficient documentation

## 2020-07-27 DIAGNOSIS — I5042 Chronic combined systolic (congestive) and diastolic (congestive) heart failure: Secondary | ICD-10-CM | POA: Insufficient documentation

## 2020-07-27 DIAGNOSIS — I130 Hypertensive heart and chronic kidney disease with heart failure and stage 1 through stage 4 chronic kidney disease, or unspecified chronic kidney disease: Secondary | ICD-10-CM | POA: Diagnosis not present

## 2020-07-27 DIAGNOSIS — I459 Conduction disorder, unspecified: Secondary | ICD-10-CM | POA: Diagnosis not present

## 2020-07-27 DIAGNOSIS — N183 Chronic kidney disease, stage 3 unspecified: Secondary | ICD-10-CM | POA: Insufficient documentation

## 2020-07-27 DIAGNOSIS — X58XXXA Exposure to other specified factors, initial encounter: Secondary | ICD-10-CM | POA: Insufficient documentation

## 2020-07-27 DIAGNOSIS — I252 Old myocardial infarction: Secondary | ICD-10-CM | POA: Diagnosis not present

## 2020-07-27 DIAGNOSIS — Z8546 Personal history of malignant neoplasm of prostate: Secondary | ICD-10-CM | POA: Diagnosis not present

## 2020-07-27 DIAGNOSIS — E782 Mixed hyperlipidemia: Secondary | ICD-10-CM | POA: Diagnosis not present

## 2020-07-27 DIAGNOSIS — R159 Full incontinence of feces: Secondary | ICD-10-CM | POA: Diagnosis not present

## 2020-07-27 DIAGNOSIS — Z96653 Presence of artificial knee joint, bilateral: Secondary | ICD-10-CM | POA: Insufficient documentation

## 2020-07-27 DIAGNOSIS — I2510 Atherosclerotic heart disease of native coronary artery without angina pectoris: Secondary | ICD-10-CM | POA: Insufficient documentation

## 2020-07-27 DIAGNOSIS — Z7901 Long term (current) use of anticoagulants: Secondary | ICD-10-CM | POA: Insufficient documentation

## 2020-07-27 DIAGNOSIS — K219 Gastro-esophageal reflux disease without esophagitis: Secondary | ICD-10-CM | POA: Diagnosis not present

## 2020-07-27 LAB — CBC WITH DIFFERENTIAL/PLATELET
Abs Immature Granulocytes: 0.03 10*3/uL (ref 0.00–0.07)
Basophils Absolute: 0.1 10*3/uL (ref 0.0–0.1)
Basophils Relative: 1 %
Eosinophils Absolute: 0.7 10*3/uL — ABNORMAL HIGH (ref 0.0–0.5)
Eosinophils Relative: 14 %
HCT: 40.3 % (ref 39.0–52.0)
Hemoglobin: 12.5 g/dL — ABNORMAL LOW (ref 13.0–17.0)
Immature Granulocytes: 1 %
Lymphocytes Relative: 24 %
Lymphs Abs: 1.3 10*3/uL (ref 0.7–4.0)
MCH: 25.8 pg — ABNORMAL LOW (ref 26.0–34.0)
MCHC: 31 g/dL (ref 30.0–36.0)
MCV: 83.1 fL (ref 80.0–100.0)
Monocytes Absolute: 0.5 10*3/uL (ref 0.1–1.0)
Monocytes Relative: 9 %
Neutro Abs: 2.8 10*3/uL (ref 1.7–7.7)
Neutrophils Relative %: 51 %
Platelets: 254 10*3/uL (ref 150–400)
RBC: 4.85 MIL/uL (ref 4.22–5.81)
RDW: 16.7 % — ABNORMAL HIGH (ref 11.5–15.5)
WBC: 5.3 10*3/uL (ref 4.0–10.5)
nRBC: 0 % (ref 0.0–0.2)

## 2020-07-27 LAB — BASIC METABOLIC PANEL
Anion gap: 9 (ref 5–15)
BUN: 18 mg/dL (ref 8–23)
CO2: 25 mmol/L (ref 22–32)
Calcium: 9.9 mg/dL (ref 8.9–10.3)
Chloride: 103 mmol/L (ref 98–111)
Creatinine, Ser: 1.24 mg/dL (ref 0.61–1.24)
GFR, Estimated: 56 mL/min — ABNORMAL LOW (ref >60)
Glucose, Bld: 105 mg/dL — ABNORMAL HIGH (ref 70–99)
Potassium: 3.7 mmol/L (ref 3.5–5.1)
Sodium: 137 mmol/L (ref 135–145)

## 2020-07-27 MED ORDER — SODIUM CHLORIDE 0.9 % IV SOLN
1.0000 g | Freq: Once | INTRAVENOUS | Status: AC
  Administered 2020-07-27: 1000 mg via INTRAVENOUS
  Filled 2020-07-27: qty 1

## 2020-07-27 NOTE — ED Provider Notes (Signed)
I provided a substantive portion of the care of this patient.  I personally performed the entirety of the medical decision making for this encounter.  87 year old male who presents with left forearm PICC line infection.  This was removed and patient will have peripheral IV established and given IV dose of antibiotics and follow-up with IR   Lacretia Leigh, MD 07/27/20 2318

## 2020-07-27 NOTE — Telephone Encounter (Signed)
Patient's wife called this morning stating she took the patient to the ED yesterday evening and was there until Midnight and could not wait any longer. Patient wife stated the patient can not sit in the ED for hours at a time and it is uncomfortable for the patient.   I have attempted to call IR today to get the patient scheduled to have the picc line evaluated. Caryl Pina with IR did not answer and I left a voicemail for her to call me back.  Doniven Vanpatten T Brooks Sailors

## 2020-07-27 NOTE — ED Provider Notes (Signed)
[facility] EMERGENCY DEPARTMENT Provider Note   CSN: [id number] Arrival date & time: 07/27/20  1514     History Chief Complaint  Patient presents with   Vascular Access Problem    [name] is a 87 y.o. male with multiple comorbidities as listed below, presenting for evaluation of right PICC line infection.  Patient's wife and home health nurse noticed redness to the right PICC site yesterday.  He checked into the ED for evaluation, however due to wait times left to go home to rest.  The redness looked worse today and there is purulent drainage coming from the site.  He is being some soreness to the right arm as well.  Wife did not give him his dose of antibiotics this morning due to concern for infection.  Otherwise he has received all of his doses of Invanz.  Patient denies any fevers or chills, denies body aches.  Patient's wife states monitoring CT scan yesterday showed nearly resolved infection though infectious disease wants him to continue on antibiotics for another 2 weeks.  Called IR who is an appointment for him tomorrow morning at 8:45 AM to replace the PICC, however they were instructed to come to the ED for more urgent evaluation with concern for infection.  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  He is currently on ertapenem once daily, followed by Dr. Juleen China with infectious disease.     The history is provided by the patient, medical records and the spouse.      Past Medical History:  Diagnosis Date   Aortic valve stenosis 04/05/2015   Formatting of this note might be different from the original. Overview:  ECHO 5/15 Formatting of this note might be different from the original. Overview:  Overview:  ECHO 5/15   Arthritis    "knees, right shoulder" (03/14/2017)   Atrial fibrillation, persistent ([place]) 09/19/2014   CAD  (coronary artery disease), native coronary artery    Cath 2011 LHC (08/2009):~ Proximal LAD 30%, mid to distal LAD 25%, ostial small D1 75% mid AV groove circumflex 99% been subtotal stenosis, proximal to mid RCA 25-30%, mid RCA 30%, mid PDA 30%, EF 50% with inferior hypokinesis.;    July 2011  PCI and DES to circumflex Dr. Olevia Perches   ETT-Myoview (06/2013):  Inferolateral scar, mild peri-infarct ischemia, EF 40%; Intermediate Risk   ECHO EF 55% 03/2013    CAD (coronary artery disease), native coronary artery 04/05/2015   Cath 2011 LHC (08/2009):~ Proximal LAD 30%, mid to distal LAD 25%, ostial small D1 75% mid AV groove circumflex 99% been subtotal stenosis, proximal to mid RCA 25-30%, mid RCA 30%, mid PDA 30%, EF 50% with inferior hypokinesis.;    July 2011  PCI and DES to circumflex Dr. Olevia Perches   ETT-Myoview (06/2013):  Inferolateral scar, mild peri-infarct ischemia, EF 40%; Intermediate Risk   ECHO EF 55% 03/2013     Cardiac pacemaker in situ 03/14/2017   Biventricular [employer] inserted 03/14/17 Dr. 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   History of prostate cancer    Radical prostatectomy in 2006 Dr. Terance Hart    Hypertensive heart disease    Internal hemorrhoids 09/11/2018   Long term (current) use of anticoagulants 10/06/2011   Mixed hyperlipidemia    Moderate persistent asthma without complication 10/21/7827   Noise effect on both inner ears 08/14/2018   Obesity (BMI 30-39.9)    OSA on CPAP    OSA treated with BiPAP 11/15/2015   Persistent atrial fibrillation ([place]) 09/19/2014   CHA2DS2VASC score 5  Cardioversion  10/08/2014 was on amiodarone until 2018    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)    Initial DVT in 2006 after prostate surgery and had [place] filter placed Bilateral  PE 2013 and placed back on warfarin DVT of right subclavian vein on doppler 10/2012 at time of knee infection    Personal history of malignant neoplasm of prostate    Radical prostatectomy in 2006 Dr. Terance Hart     Presbycusis of both ears 08/14/2018   Severe aortic stenosis    Severe aortic stenosis 03/18/2018   Urinary incontinence    MULTIPLE BLADDER SURGERIES - STATES NO URINARY SPHINCTER - PT'S UROLOGIST IS AT [facility]- DR. PETERSON  ( LAST VISIT WAS 09/15/11 )    Patient Active Problem List   Diagnosis Date Noted   PICC (peripherally inserted central catheter) in place 06/11/2020   Medication monitoring encounter 06/11/2020   Renal abscess    Perirectal abscess 05/15/2020   Pyelonephritis of right kidney 05/15/2020   Presence of IVC filter 05/15/2020   Constipation, chronic 05/15/2020   Cysts of right kidney 05/15/2020   Severe aortic stenosis    OSA on CPAP    History of prostate cancer    History of peptic ulcer    History of kidney stones    GERD    CAD (coronary artery disease), native coronary artery    Arthritis    Bulbous urethral stricture 12/03/2019   Internal hemorrhoids 09/11/2018   Noise effect on both inner ears 08/14/2018   Presbycusis of both ears 08/14/2018   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   CKD (chronic kidney disease), stage III ([facility])    Chronic combined systolic and diastolic heart failure ([place]) 11/19/2017   Cardiac pacemaker in situ 03/14/2017   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) 01/24/2016   OSA treated with BiPAP 11/15/2015   Personal history of malignant neoplasm of prostate 04/05/2015   Hypertensive heart disease 04/05/2015   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[place]) 04/05/2015   CAD (coronary  artery disease), native coronary artery 04/05/2015   Aortic valve stenosis 04/05/2015   Persistent atrial fibrillation ([place]) 09/19/2014   Moderate persistent asthma without complication 56/21/3086   Atrial fibrillation, persistent ([place]) 09/19/2014   Angina pectoris ([place]) 09/18/2014   Urinary incontinence 03/04/2014   Obesity (BMI 30-39.9)    Long term (current) use of anticoagulants 10/06/2011   Mixed hyperlipidemia 02/24/2010   Gastro-esophageal reflux disease without esophagitis 09/24/2009    Past Surgical History:  Procedure Laterality Date   APPENDECTOMY     BI-VENTRICULAR PACEMAKER INSERTION (CRT-P)  03/14/2017   BIV PACEMAKER INSERTION CRT-P N/A 03/14/2017   Procedure: BIV PACEMAKER INSERTION CRT-P;  Surgeon: Evans Lance, MD;  Location: [facility];  Service: Cardiovascular;  Laterality: N/A;   CARDIOVERSION N/A 10/08/2014   Procedure: CARDIOVERSION;  Surgeon: Jacolyn Reedy, MD;  Location: [facility]  ENDOSCOPY;  Service: Cardiovascular;  Laterality: N/A;   CATARACT EXTRACTION W/ INTRAOCULAR LENS  IMPLANT, BILATERAL Bilateral    CORONARY ANGIOPLASTY WITH STENT PLACEMENT  08/25/2009   DES-mid LCx 08/2009; 30% pLAD, 25% m/dLAD, 75% ostial D1, 99% mLCx s/p DES, 25-30% p/mRCA, 40% mRCA, 30% mPDA stenoses; LVEF 50%, inf hypokinesis   CORONARY STENT INTERVENTION N/A 03/18/2018   Procedure: CORONARY STENT INTERVENTION;  Surgeon: Sherren Mocha, MD;  Location: [facility];  Service: Cardiovascular;  Laterality: N/A;   EXCISIONAL HEMORRHOIDECTOMY     I & D KNEE WITH POLY EXCHANGE Left 10/09/2012   Procedure: IRRIGATION AND DEBRIDEMENT LEFT KNEE WITH POLY REVISION;  Surgeon: Gearlean Alf, MD;  Location: [facility];  Service: Orthopedics;  Laterality: Left;   INCISION AND DRAINAGE ABSCESS N/A 05/16/2020   Procedure: INCISION AND DRAINAGE  PERIRECTAL ABSCESS;  Surgeon: Rolm Bookbinder, MD;  Location: [facility];  Service: General;  Laterality: N/A;   INGUINAL HERNIA REPAIR Right     INTRAOPERATIVE TRANSTHORACIC ECHOCARDIOGRAM  03/19/2018   Procedure: Intraoperative Transthoracic Echocardiogram;  Surgeon: Sherren Mocha, MD;  Location: [facility];  Service: Open Heart Surgery;;   IR FLUORO GUIDE CV LINE RIGHT  06/14/2020   IR RADIOLOGIST EVAL & MGMT  06/10/2020   JOINT REPLACEMENT     KNEE ARTHROTOMY Left 10/09/2012   Procedure: LEFT KNEE ARTHROTOMY;  Surgeon: Gearlean Alf, MD;  Location: [facility];  Service: Orthopedics;  Laterality: Left;   LEFT HEART CATH AND CORONARY ANGIOGRAPHY N/A 02/28/2018   Procedure: LEFT HEART CATH AND CORONARY ANGIOGRAPHY;  Surgeon: Sherren Mocha, MD;  Location: [facility];  Service: Cardiovascular;  Laterality: N/A;   LEFT HEART CATH AND CORONARY ANGIOGRAPHY N/A 09/12/2019   Procedure: LEFT HEART CATH AND CORONARY ANGIOGRAPHY;  Surgeon: Sherren Mocha, MD;  Location: [facility];  Service: Cardiovascular;  Laterality: N/A;   PROSTATECTOMY  04/25/2004   REPLACEMENT TOTAL KNEE Bilateral    SKIN BIOPSY     "off nose; wasn't cancer; it was tested" (03/14/2017)   TRANSCATHETER AORTIC VALVE REPLACEMENT, TRANSFEMORAL N/A 03/19/2018   Procedure: TRANSCATHETER AORTIC VALVE REPLACEMENT, TRANSFEMORAL;  Surgeon: Sherren Mocha, MD;  Location: [facility];  Service: Open Heart Surgery;  Laterality: N/A;   Uretheral implants     multiple for incontinence   VENA CAVA FILTER PLACEMENT         Family History  Problem Relation Age of Onset   Melanoma Father    Melanoma Brother     Social History   Tobacco Use   Smoking status: Former    Packs/day: 1.00    Years: 10.00    Pack years: 10.00    Types: Cigarettes    Quit date: 04/27/1961    Years since quitting: 59.2   Smokeless tobacco: Never  Vaping Use   Vaping Use: Never used  Substance Use Topics   Alcohol use: No    Alcohol/week: 0.0 standard drinks   Drug use: No    Home Medications Prior to Admission medications   Medication Sig Start Date End Date Taking? Authorizing Provider   acetaminophen (TYLENOL) 500 MG tablet Take 500 mg by mouth at bedtime.     [provider]  amLODipine (NORVASC) 2.5 MG tablet Take 1 tablet (2.5 mg total) by mouth daily. 09/03/19   Tobb, Kardie, DO  Ascorbic Acid (VITAMIN C) 1000 MG tablet Take 1,000 mg by mouth daily.    [provider]  Biotin 2500 MCG CAPS Take 2,500 mcg by  mouth daily.    [provider]  cholecalciferol (VITAMIN D) 1000 UNITS tablet Take 1,000 Units by mouth daily.    [provider]  docusate sodium (COLACE) 50 MG capsule Take 50 mg by mouth daily.    [provider]  furosemide (LASIX) 20 MG tablet Take 1 tablet (20 mg total) by mouth daily. 06/02/20   Richardo Priest, MD  Glucosamine HCl (GLUCOSAMINE PO) Take 2,000 mg by mouth daily.    [provider]  isosorbide mononitrate (IMDUR) 30 MG 24 hr tablet Take 0.5 tablets (15 mg total) by mouth daily. 02/05/20   Richardo Priest, MD  Multiple Vitamin (MULTIVITAMIN) capsule Take 1 capsule by mouth daily.  04/28/19   [provider]  nitroGLYCERIN (NITROSTAT) 0.4 MG SL tablet Place 1 tablet (0.4 mg total) under the tongue every 5 (five) minutes as needed. 07/14/19   Richardo Priest, MD  Omega-3 Fatty Acids (FISH OIL) 1000 MG CAPS Take 1,000 mg by mouth daily.    [provider]  pantoprazole (PROTONIX) 40 MG tablet Take 1 tablet (40 mg total) by mouth daily at 6 (six) AM. 05/29/20   Sheikh, Georgina Quint Latif, DO  polyethylene glycol (MIRALAX / GLYCOLAX) 17 g packet Take 17 g by mouth 2 (two) times daily. 05/28/20   Raiford Noble Latif, DO  pravastatin (PRAVACHOL) 20 MG tablet TAKE ONE TABLET BY MOUTH ONCE DAILY Patient taking differently: Take 20 mg by mouth daily. 01/08/20   Richardo Priest, MD  vitamin B-12 (CYANOCOBALAMIN) 500 MCG tablet Take 500 mcg by mouth daily.    [provider]  warfarin (COUMADIN) 2.5 MG tablet Take 1 tablet daily or as directed by Coumadin Clinic Patient taking differently: Take  2.5 mg by mouth daily. 04/16/20   Richardo Priest, MD    Allergies    Darifenacin, Codeine, Sulfamethoxazole-trimethoprim, and Lisinopril  Review of Systems   Review of Systems  Constitutional:  Negative for chills and fever.  Respiratory:  Negative for shortness of breath.   Cardiovascular:  Negative for chest pain.  Musculoskeletal:  Negative for myalgias.  All other systems reviewed and are negative.  Physical Exam Updated Vital Signs BP (!) 155/82 (BP Location: Left Arm)   Pulse 66   Temp 97.6 F (36.4 C) (Oral)   Resp 20   SpO2 98%   Physical Exam Vitals and nursing note reviewed.  Constitutional:      Appearance: He is well-developed.  HENT:     Head: Normocephalic and atraumatic.  Eyes:     Conjunctiva/sclera: Conjunctivae normal.  Cardiovascular:     Rate and Rhythm: Normal rate and regular rhythm.     Comments: Right medial upper arm with PICC in place.  The surrounding skin shows erythema with some white/yellow drainage at the PICC line site.  See image below.  There is some tenderness.  No large induration or swelling. Pulmonary:     Effort: Pulmonary effort is normal. No respiratory distress.     Breath sounds: Normal breath sounds.  Abdominal:     Palpations: Abdomen is soft.  Skin:    General: Skin is warm.  Neurological:     Mental Status: He is alert.  Psychiatric:        Behavior: Behavior normal.       ED Results / Procedures / Treatments   Labs (all labs ordered are listed, but only abnormal results are displayed) Labs Reviewed  BASIC METABOLIC PANEL - Abnormal; Notable for the following  components:      Result Value   Glucose, Bld 105 (*)    GFR, Estimated 56 (*)    All other components within normal limits  CBC WITH DIFFERENTIAL/PLATELET - Abnormal; Notable for the following components:   Hemoglobin 12.5 (*)    MCH 25.8 (*)    RDW 16.7 (*)    Eosinophils Absolute 0.7 (*)    All other components within normal limits  CULTURE, BLOOD  (ROUTINE X 2)  CULTURE, BLOOD (ROUTINE X 2)    EKG None  Radiology DG Chest 2 View  Result Date: 07/26/2020 CLINICAL DATA:  Assess PICC line insertion. EXAM: CHEST - 2 VIEW COMPARISON:  06/07/2020 FINDINGS: Right arm PICC line tip is in the distal SVC. There is a left chest wall pacer device with leads in the right atrial appendage, coronary sinus and right ventricle. Aortic valve replacement. Normal heart size. Aortic atherosclerotic calcifications noted. Blunting of the left costophrenic angle may reflect pleuroparenchymal scarring or pleural effusion. No interstitial edema or airspace opacities. Marked degenerative changes are noted involving both glenohumeral joints. IMPRESSION: Right arm PICC line tip is in the distal SVC. Electronically Signed   By: Kerby Moors M.D.   On: 07/26/2020 21:10    Procedures Procedures   Medications Ordered in ED Medications  ertapenem (INVANZ) 1,000 mg in sodium chloride 0.9 % 100 mL IVPB (1,000 mg Intravenous New Bag/Given 07/27/20 2324)    ED Course  I have reviewed the triage vital signs and the nursing notes.  Pertinent labs & imaging results that were available during my care of the patient were reviewed by me and considered in my medical decision making (see chart for details).  Clinical Course as of 07/28/20 0002  Tue Jul 27, 2020  2208 Picc line removed at bedside. No active bleeding. Picc line cath  measures to 42 [JR]    Clinical Course User Index [JR] Jozelynn Danielson, [place] N, PA-C   MDM Rules/Calculators/A&P                          Patient is a 87 year old male here for evaluation of PICC line site on the right arm.  He does show signs of surrounding infection to the skin, he has no systemic symptoms however and is afebrile here.  He is overall well-appearing.  Blood cultures are sent.  He has no leukocytosis.  Per patient's wife, his infectious disease physician Dr. Juleen China, has discussed the need for 2 more weeks of the IV ertapenem.   Patient's wife has made an appointment for tomorrow morning with IR for PICC line replacement.  Today however there is concern for infection at the PICC site.  This is discussed with attending physician Dr. Zenia Resides, who recommends PICC line be removed here in the ED.  It is removed without complication, no bleeding.  The skin is erythematous with white drainage, appearance is suspicious for candidal infection of the skin.  Patient is given his dose of ertapenem here in the ED.  Blood cultures are pending.  He has IR appointment in the morning, encouraged he attend.  In the interim he is sent home with peripheral IV for access for his IV antibiotics.  Discussed results, findings, treatment and follow up. Patient advised of return precautions. Patient verbalized understanding and agreed with plan.   Final Clinical Impression(s) / ED Diagnoses Final diagnoses:  PICC line infection, initial encounter    Rx / DC Orders ED Discharge Orders  None        Lianny Molter, [place] N, PA-C 07/28/20 0002    Lacretia Leigh, MD 07/29/20 [phone]

## 2020-07-27 NOTE — Telephone Encounter (Signed)
Caryl Pina with IR called back and they can see the patient at 8:45 am tomorrow to evaluate the picc. Is that ok or would you still rather him come in the office?

## 2020-07-27 NOTE — Telephone Encounter (Signed)
I have spoke with [name] patient's wife and advised her that the patient is scheduled with IR to have picc line evaluated tomorrow at 8:45 am. Patient's wife verbalized understanding. I advised her if the patient get any worse then she will need to take him to the ED. Patient's wife verbalized understanding.  Aalaysia Liggins T Brooks Sailors

## 2020-07-27 NOTE — Telephone Encounter (Signed)
The earliest we have is tomorrow afternoon with [name]. Is that ok?

## 2020-07-27 NOTE — ED Provider Notes (Signed)
Emergency Medicine Provider Triage Evaluation Note  [name] , a 87 y.o. male  was evaluated in triage.  Pt complains of redness, pain, and purulent drainage from around his PICC line insertion site.  PICC line placed in the right upper arm for IV treatment with Invanz for ESBL kidney infection diagnosed in April 2022.  Following with infectious disease.  Patient's wife is at the bedside.  States that yesterday home health nurse felt that the insertion site was red and the patient endorses pain with palpation to the area.  Increased redness and discharge around insertion site today, however pain is not worsened.  No fevers or chills.  Patient was seen in the emergency department last night in triage, however waited for several hours and was uncomfortable and went home to rest and has presented back today.  Review of Systems  Positive: Redness, pain, discharge from PICC line insertion site Negative: Fevers, chills, nausea, vomiting, diarrhea, chest pain, shortness of breath or palpitations.  Physical Exam  There were no vitals taken for this visit. Gen:   Awake, no distress   Resp:  Normal effort  MSK:   Moves extremities without difficulty  Other:  Erythema and purulent and sanguinous scant discharge beneath sterile dressing around insertion site of PICC line in right upper arm, mild tenderness palpation of the site.  2+ radial pulses in that arm with normal cap refill and function in the right hand.  Medical Decision Making  Medically screening exam initiated at 3:29 PM.  Appropriate orders placed.  [name] was informed that the remainder of the evaluation will be completed by another provider, this initial triage assessment does not replace that evaluation, and the importance of remaining in the ED until their evaluation is complete.  This chart was dictated using voice recognition software, Dragon. Despite the best efforts of this provider to proofread and correct errors,  errors may still occur which can change documentation meaning.    Aura Dials 07/27/20 1540    Noemi Chapel, MD 07/28/20 1031

## 2020-07-27 NOTE — ED Triage Notes (Signed)
Pt on IV abx at home via PICC line for kidney infection. Pt's home health nurse advised to come to ED for evaluation of possible infection/malposition. Area red and painful to touch.

## 2020-07-28 ENCOUNTER — Other Ambulatory Visit: Payer: Self-pay

## 2020-07-28 ENCOUNTER — Ambulatory Visit (HOSPITAL_COMMUNITY)
Admission: RE | Admit: 2020-07-28 | Discharge: 2020-07-28 | Disposition: A | Discharge status: Home / Self Care | Payer: Medicare Other | Source: Ambulatory Visit | Attending: Internal Medicine | Admitting: Internal Medicine

## 2020-07-28 ENCOUNTER — Other Ambulatory Visit (HOSPITAL_COMMUNITY): Payer: Self-pay | Admitting: Internal Medicine

## 2020-07-28 DIAGNOSIS — Z452 Encounter for adjustment and management of vascular access device: Secondary | ICD-10-CM | POA: Diagnosis not present

## 2020-07-28 MED ORDER — HEPARIN SOD (PORK) LOCK FLUSH 100 UNIT/ML IV SOLN
INTRAVENOUS | Status: AC
  Filled 2020-07-28: qty 5

## 2020-07-28 MED ORDER — LIDOCAINE HCL 1 % IJ SOLN
INTRAMUSCULAR | Status: DC | PRN
  Administered 2020-07-28: 5 mL
  Administered 2020-07-28: 10 mL

## 2020-07-28 MED ORDER — LIDOCAINE HCL 1 % IJ SOLN
INTRAMUSCULAR | Status: AC
  Filled 2020-07-28: qty 20

## 2020-07-28 NOTE — Discharge Instructions (Addendum)
Please attend your appointment with IR tomorrow morning for PICC line replacement. Follow closely with your infectious disease doctor and primary care. Return if you develop fever, chills, or other concerning symptoms.

## 2020-07-28 NOTE — Procedures (Signed)
Successful placement of single lumen PICC line to left basilic vein. Length 45 cm Tip at lower SVC/RA PICC capped No complications Ready for use.  EBL < 5 mL   Ascencion Dike PA-C 07/28/2020 9:26 AM

## 2020-07-29 DIAGNOSIS — I130 Hypertensive heart and chronic kidney disease with heart failure and stage 1 through stage 4 chronic kidney disease, or unspecified chronic kidney disease: Secondary | ICD-10-CM | POA: Diagnosis not present

## 2020-07-29 DIAGNOSIS — N200 Calculus of kidney: Secondary | ICD-10-CM | POA: Diagnosis not present

## 2020-07-29 DIAGNOSIS — N119 Chronic tubulo-interstitial nephritis, unspecified: Secondary | ICD-10-CM | POA: Diagnosis not present

## 2020-07-29 DIAGNOSIS — I4821 Permanent atrial fibrillation: Secondary | ICD-10-CM | POA: Diagnosis not present

## 2020-07-29 DIAGNOSIS — I2510 Atherosclerotic heart disease of native coronary artery without angina pectoris: Secondary | ICD-10-CM | POA: Diagnosis not present

## 2020-07-29 DIAGNOSIS — K611 Rectal abscess: Secondary | ICD-10-CM | POA: Diagnosis not present

## 2020-08-01 LAB — CULTURE, BLOOD (ROUTINE X 2)
Culture: NO GROWTH
Special Requests: ADEQUATE

## 2020-08-02 DIAGNOSIS — I4821 Permanent atrial fibrillation: Secondary | ICD-10-CM | POA: Diagnosis not present

## 2020-08-02 DIAGNOSIS — N200 Calculus of kidney: Secondary | ICD-10-CM | POA: Diagnosis not present

## 2020-08-02 DIAGNOSIS — I2510 Atherosclerotic heart disease of native coronary artery without angina pectoris: Secondary | ICD-10-CM | POA: Diagnosis not present

## 2020-08-02 DIAGNOSIS — I5042 Chronic combined systolic (congestive) and diastolic (congestive) heart failure: Secondary | ICD-10-CM | POA: Diagnosis not present

## 2020-08-02 DIAGNOSIS — N1832 Chronic kidney disease, stage 3b: Secondary | ICD-10-CM | POA: Diagnosis not present

## 2020-08-02 DIAGNOSIS — D62 Acute posthemorrhagic anemia: Secondary | ICD-10-CM | POA: Diagnosis not present

## 2020-08-02 DIAGNOSIS — I130 Hypertensive heart and chronic kidney disease with heart failure and stage 1 through stage 4 chronic kidney disease, or unspecified chronic kidney disease: Secondary | ICD-10-CM | POA: Diagnosis not present

## 2020-08-02 DIAGNOSIS — N119 Chronic tubulo-interstitial nephritis, unspecified: Secondary | ICD-10-CM | POA: Diagnosis not present

## 2020-08-02 DIAGNOSIS — K611 Rectal abscess: Secondary | ICD-10-CM | POA: Diagnosis not present

## 2020-08-03 ENCOUNTER — Telehealth: Payer: Self-pay | Admitting: *Deleted

## 2020-08-03 ENCOUNTER — Telehealth: Payer: Self-pay | Admitting: Cardiology

## 2020-08-03 DIAGNOSIS — K611 Rectal abscess: Secondary | ICD-10-CM | POA: Diagnosis not present

## 2020-08-03 DIAGNOSIS — N119 Chronic tubulo-interstitial nephritis, unspecified: Secondary | ICD-10-CM | POA: Diagnosis not present

## 2020-08-03 DIAGNOSIS — I2510 Atherosclerotic heart disease of native coronary artery without angina pectoris: Secondary | ICD-10-CM | POA: Diagnosis not present

## 2020-08-03 DIAGNOSIS — I130 Hypertensive heart and chronic kidney disease with heart failure and stage 1 through stage 4 chronic kidney disease, or unspecified chronic kidney disease: Secondary | ICD-10-CM | POA: Diagnosis not present

## 2020-08-03 DIAGNOSIS — I4821 Permanent atrial fibrillation: Secondary | ICD-10-CM | POA: Diagnosis not present

## 2020-08-03 DIAGNOSIS — N200 Calculus of kidney: Secondary | ICD-10-CM | POA: Diagnosis not present

## 2020-08-03 NOTE — Telephone Encounter (Signed)
This fax was sent successfully on 07/15/2020 but I just resent the fax at this time.

## 2020-08-03 NOTE — Telephone Encounter (Signed)
Colletta Maryland from [facility] is calling in regards to an order that was faxed on 07/07/20 for this pt, Colletta Maryland wants to know if order was received and if she can get it faxed back to [phone]

## 2020-08-03 NOTE — Telephone Encounter (Signed)
Pt in the coumadin clinic's follow up book. [facility] has been checking pt's INR.  Spoke to [place] and she would put in a order for someone to come out to check pt's INR this week.

## 2020-08-04 ENCOUNTER — Ambulatory Visit (INDEPENDENT_AMBULATORY_CARE_PROVIDER_SITE_OTHER): Payer: Medicare Other | Admitting: Internal Medicine

## 2020-08-04 DIAGNOSIS — Z5181 Encounter for therapeutic drug level monitoring: Secondary | ICD-10-CM | POA: Diagnosis not present

## 2020-08-04 DIAGNOSIS — M1990 Unspecified osteoarthritis, unspecified site: Secondary | ICD-10-CM | POA: Diagnosis not present

## 2020-08-04 DIAGNOSIS — Z8546 Personal history of malignant neoplasm of prostate: Secondary | ICD-10-CM | POA: Diagnosis not present

## 2020-08-04 DIAGNOSIS — N200 Calculus of kidney: Secondary | ICD-10-CM | POA: Diagnosis not present

## 2020-08-04 DIAGNOSIS — I2510 Atherosclerotic heart disease of native coronary artery without angina pectoris: Secondary | ICD-10-CM | POA: Diagnosis not present

## 2020-08-04 DIAGNOSIS — N119 Chronic tubulo-interstitial nephritis, unspecified: Secondary | ICD-10-CM | POA: Diagnosis not present

## 2020-08-04 DIAGNOSIS — K611 Rectal abscess: Secondary | ICD-10-CM | POA: Diagnosis not present

## 2020-08-04 DIAGNOSIS — E038 Other specified hypothyroidism: Secondary | ICD-10-CM | POA: Diagnosis not present

## 2020-08-04 DIAGNOSIS — E7800 Pure hypercholesterolemia, unspecified: Secondary | ICD-10-CM | POA: Diagnosis not present

## 2020-08-04 DIAGNOSIS — K219 Gastro-esophageal reflux disease without esophagitis: Secondary | ICD-10-CM | POA: Diagnosis not present

## 2020-08-04 DIAGNOSIS — I130 Hypertensive heart and chronic kidney disease with heart failure and stage 1 through stage 4 chronic kidney disease, or unspecified chronic kidney disease: Secondary | ICD-10-CM | POA: Diagnosis not present

## 2020-08-04 DIAGNOSIS — E785 Hyperlipidemia, unspecified: Secondary | ICD-10-CM | POA: Diagnosis not present

## 2020-08-04 DIAGNOSIS — I4891 Unspecified atrial fibrillation: Secondary | ICD-10-CM | POA: Diagnosis not present

## 2020-08-04 DIAGNOSIS — N183 Chronic kidney disease, stage 3 unspecified: Secondary | ICD-10-CM | POA: Diagnosis not present

## 2020-08-04 DIAGNOSIS — I4821 Permanent atrial fibrillation: Secondary | ICD-10-CM | POA: Diagnosis not present

## 2020-08-04 DIAGNOSIS — C61 Malignant neoplasm of prostate: Secondary | ICD-10-CM | POA: Diagnosis not present

## 2020-08-04 DIAGNOSIS — I10 Essential (primary) hypertension: Secondary | ICD-10-CM | POA: Diagnosis not present

## 2020-08-04 LAB — POCT INR: INR: 2.5 (ref 2.0–3.0)

## 2020-08-04 NOTE — Patient Instructions (Signed)
Description   Spoke with Lujean Rave, RN with [name] and advised to have pt continue on same dosage of warfarin 1 tablet daily except for 1/2 tablet on Wednesdays and Saturdays. Recheck INR in 2 weeks by Home Health-[facility] WILL CHECK INR. Coumadin Clinic [phone].

## 2020-08-05 ENCOUNTER — Other Ambulatory Visit (HOSPITAL_COMMUNITY): Payer: Self-pay

## 2020-08-05 ENCOUNTER — Telehealth: Payer: Self-pay | Admitting: Pharmacist

## 2020-08-05 ENCOUNTER — Ambulatory Visit (INDEPENDENT_AMBULATORY_CARE_PROVIDER_SITE_OTHER): Payer: Medicare Other | Admitting: Internal Medicine

## 2020-08-05 ENCOUNTER — Telehealth: Payer: Self-pay

## 2020-08-05 ENCOUNTER — Other Ambulatory Visit: Payer: Self-pay

## 2020-08-05 VITALS — BP 113/69 | HR 62 | Temp 97.5°F

## 2020-08-05 DIAGNOSIS — R197 Diarrhea, unspecified: Secondary | ICD-10-CM | POA: Diagnosis not present

## 2020-08-05 DIAGNOSIS — N151 Renal and perinephric abscess: Secondary | ICD-10-CM

## 2020-08-05 NOTE — Patient Instructions (Signed)
Thank you for coming to see me today. It was a pleasure seeing you.  To Do: We are going to stop the IV antibiotics and switch to an oral antibiotic called Omadacycline.  They will mail it to your house Once it arrives, please stop the IV antibiotics and we will remove the PICC line Follow up in 2 weeks with me If your diarrhea does not improve, please let us know so we can check for C diff.  If you have any questions or concerns, please do not hesitate to call the office at [phone].  Take Care,   Jule Ser

## 2020-08-05 NOTE — Telephone Encounter (Signed)
Completed and sent in application for patient assistance for omadacycline. Patient/clinic should hear back soon. Will continue to follow.

## 2020-08-05 NOTE — Telephone Encounter (Signed)
I spoke to [facility] with [facility] and gave verbal orders for patient's last dose of IV antibiotics to be on 08/08/20 and picc can be removed after last dose per Dr. Juleen China. Mary verbalized understanding. Per [place] will pull picc on 08/10/20 due to 08/09/20 being a holiday and patient is aware. Yair Dusza T Brooks Sailors

## 2020-08-05 NOTE — Progress Notes (Signed)
[place] for Infectious Disease  CHIEF COMPLAINT:    Follow up for renal abscess  SUBJECTIVE:    [name] is a 87 y.o. male with PMHx as below who presents to the clinic for renal abscess.   Patient is in for his routine visit today.  He was previously seen by me on 07/16/2020.  During that interval he was seen by urologist at [facility] (Dr. Tresa Endo).  He had a follow-up CT scan that was obtained on 07/27/2020.  This showed stable appearance of the right-sided pyelonephritis and right renal calculi.  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  There was no evidence of ureteral calculi or hydronephrosis.  He had a stable 4 cm right renal sinus abscess and the previously seen small subscapular right renal abscess had nearly completely resolved since the prior exam in May.  Unfortunately, last week patient had trouble with his PICC line.  There was concern for infection and he went to the ER where his PICC line was removed.  This was without complication.  He subsequently had a left arm single-lumen PICC placed by IR the next day on 6/22.  Lab work obtained in the ER showed no evidence of leukocytosis and blood cultures obtained were negative.  He has been continued on IV ertapenem due to this persistent right renal sinus abscess.  He returns for follow-up with urology on July 14.  He presents today with his wife and reports overall feeling well.  He ate [place] food the other night which led to diarrhea and this is now improving.  No fevers or chills.  He does have occassional back pain.  His new Picc line is working without issues and he reports that his urologist was pleased with the most recent CT scan and would like for Pat to stay on antibiotics for a few more weeks until he returns for follow up and presumably has a repeat CT scan obtained.   HPI taken from prior notes 5/26: Patient was recently hospitalized from 05/15/2020 through 05/28/2020 with  perirectal abscess secondary to ESBL status post I&D 05/16/2020.  Also, found to have pyelonephritis and right renal subscapular abscess secondary to ESBL in the setting non-obstructing nephrolithiasis.  Underwent IR drain placement on 05/18/2020 with an interval CT scan done 05/24/2020 which showed drain was in place.  CT also noted two other smaller collections that were reviewed by IR without any further intervention planned given their size.  He had a follow-up visit with IR on 06/10/2020 with CT drain study demonstrating that the perinephric collection with the drain had essentially resolved.  The drain was removed that day.  The residual renal collections were suspicious for small abscesses which had not significantly changed since prior imaging.  Of the residual fluid collections, per IR, only one of the collections could be potentially aspirated or drained but this was deferred to due to the patient overall appearing clinically well.  He was seen for follow-up by my partner, Dr. Linus Salmons, on 06/11/2020.  As the patient had residual findings noted on CT the day before, his antibiotics were extended for an additional 2 weeks through 07/01/2020 with follow-up CT scan performed on 06/29/2020.  Patient had this imaging done which showed a 1.8 x 3.6 cm pericapsular fluid collection, mildly progressive from the most recent prior study now status post drain removal.  Additionally, radiology noted an abnormal appearance of the right kidney, particularly along the posterior right upper pole  and posterior interpolar region associated with a 4.5 x 3.4 cm complex fluid collection/abscess in the right renal sinus.  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   Please see A&P for the details of today's visit and status of the patient's medical problems.   Patient's Medications  New Prescriptions   No  medications on file  Previous Medications   ACETAMINOPHEN (TYLENOL) 500 MG TABLET    Take 500 mg by mouth at bedtime.    AMLODIPINE (NORVASC) 2.5 MG TABLET    Take 1 tablet (2.5 mg total) by mouth daily.   ASCORBIC ACID (VITAMIN C) 1000 MG TABLET    Take 1,000 mg by mouth daily.   BIOTIN 2500 MCG CAPS    Take 2,500 mcg by mouth daily.   CHOLECALCIFEROL (VITAMIN D) 1000 UNITS TABLET    Take 1,000 Units by mouth daily.   DOCUSATE SODIUM (COLACE) 50 MG CAPSULE    Take 50 mg by mouth daily.   FUROSEMIDE (LASIX) 20 MG TABLET    Take 1 tablet (20 mg total) by mouth daily.   GLUCOSAMINE HCL (GLUCOSAMINE PO)    Take 2,000 mg by mouth daily.   ISOSORBIDE MONONITRATE (IMDUR) 30 MG 24 HR TABLET    Take 0.5 tablets (15 mg total) by mouth daily.   MULTIPLE VITAMIN (MULTIVITAMIN) CAPSULE    Take 1 capsule by mouth daily.    NITROGLYCERIN (NITROSTAT) 0.4 MG SL TABLET    Place 1 tablet (0.4 mg total) under the tongue every 5 (five) minutes as needed.   OMEGA-3 FATTY ACIDS (FISH OIL) 1000 MG CAPS    Take 1,000 mg by mouth daily.   PANTOPRAZOLE (PROTONIX) 40 MG TABLET    Take 1 tablet (40 mg total) by mouth daily at 6 (six) AM.   POLYETHYLENE GLYCOL (MIRALAX / GLYCOLAX) 17 G PACKET    Take 17 g by mouth 2 (two) times daily.   PRAVASTATIN (PRAVACHOL) 20 MG TABLET    TAKE ONE TABLET BY MOUTH ONCE DAILY   VITAMIN B-12 (CYANOCOBALAMIN) 500 MCG TABLET    Take 500 mcg by mouth daily.   WARFARIN (COUMADIN) 2.5 MG TABLET    Take 1 tablet daily or as directed by [facility]mad[facility] Clinic  Modified Medications   No medications on file  Discontinued Medications   No medications on file      Past Medical History:  Diagnosis Date   Aortic valve stenosis 04/05/2015   Formatting of this note might be different from the original. Overview:  ECHO 5/15 Formatting of this note might be different from the original. Overview:  Overview:  ECHO 5/15   Arthritis    "knees, right shoulder" (03/14/2017)   Atrial fibrillation, persistent  ([place]) 09/19/2014   CAD (coronary artery disease), native coronary artery    Cath 2011 LHC (08/2009):~ Proximal LAD 30%, mid to distal LAD 25%, ostial small D1 75% mid AV groove circumflex 99% been subtotal stenosis, proximal to mid RCA 25-30%, mid RCA 30%, mid PDA 30%, EF 50% with inferior hypokinesis.;    July 2011  PCI and DES to circumflex Dr. Olevia Perches   ETT-Myoview (06/2013):  Inferolateral scar, mild peri-infarct ischemia, EF 40%; Intermediate Risk   ECHO EF 55% 03/2013    CAD (coronary artery disease), native coronary artery 04/05/2015   Cath 2011 LHC (08/2009):~ Proximal LAD 30%, mid to distal LAD 25%, ostial small D1 75% mid  AV groove circumflex 99% been subtotal stenosis, proximal to mid RCA 25-30%, mid RCA 30%, mid PDA 30%, EF 50% with inferior hypokinesis.;    July 2011  PCI and DES to circumflex Dr. Olevia Perches   ETT-Myoview (06/2013):  Inferolateral scar, mild peri-infarct ischemia, EF 40%; Intermediate Risk   ECHO EF 55% 03/2013     Cardiac pacemaker in situ 03/14/2017   Biventricular [employer] Jude inserted 03/14/17 Dr. Lovena Le for second degree heart block    Chronic combined systolic and diastolic heart failure ([place]) 11/19/2017   CKD (chronic kidney disease), stage III ([place])    Gastro-esophageal reflux disease without esophagitis 09/24/2009   GERD    History of infection of prosthetic knee 04/05/2015   Treated with debridement and irrigation followed by 6 months of triple antibiotics   History of kidney stones    History of peptic ulcer 1970s?   History of prostate cancer    Radical prostatectomy in 2006 Dr. Terance Hart    Hypertensive heart disease    Internal hemorrhoids 09/11/2018   Long term (current) use of anticoagulants 10/06/2011   Mixed hyperlipidemia    Moderate persistent asthma without complication 1/69/6789   Noise effect on both inner ears 08/14/2018   Obesity (BMI 30-39.9)    OSA on CPAP    OSA treated with BiPAP 11/15/2015   Persistent atrial fibrillation ([place]) 09/19/2014   CHA2DS2VASC score  5  Cardioversion 10/08/2014 was on amiodarone until 2018    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)    Initial DVT in 2006 after prostate surgery and had [place] filter placed Bilateral  PE 2013 and placed back on warfarin DVT of right subclavian vein on doppler 10/2012 at time of knee infection    Personal history of malignant neoplasm of prostate    Radical prostatectomy in 2006 Dr. Terance Hart     Presbycusis of both ears 08/14/2018   Severe aortic stenosis    Severe aortic stenosis 03/18/2018   Urinary incontinence    MULTIPLE BLADDER SURGERIES - STATES NO URINARY SPHINCTER - PT'S UROLOGIST IS AT [facility]- DR. PETERSON  ( LAST VISIT WAS 09/15/11 )    Social History   Tobacco Use   Smoking status: Former    Packs/day: 1.00    Years: 10.00    Pack years: 10.00    Types: Cigarettes    Quit date: 04/27/1961    Years since quitting: 59.3   Smokeless tobacco: Never  Vaping Use   Vaping Use: Never used  Substance Use Topics   Alcohol use: No    Alcohol/week: 0.0 standard drinks   Drug use: No    Family History  Problem Relation Age of Onset   Melanoma Father    Melanoma Brother     Allergies  Allergen Reactions   Darifenacin Nausea Only and Other (See Comments)    dry mouth and dizziness ENABLEX Dizziness Pt denies   Codeine Other (See Comments)   Sulfamethoxazole-Trimethoprim Other (See Comments)   Lisinopril Cough    Pt denies    Review of Systems  Constitutional:  Negative for chills and fever.  Respiratory: Negative.    Cardiovascular: Negative.   Gastrointestinal:  Positive for diarrhea. Negative for abdominal pain.  Genitourinary: Negative.   Musculoskeletal:  Positive for back pain.    OBJECTIVE:    Vitals:   08/05/20 1541  BP: 113/69  Pulse: 62  Temp: (!) 97.5 F (36.4 C)  TempSrc: Oral   There is no height or  weight on file to calculate BMI.  Physical Exam Constitutional:      General: He is not in acute distress.     Appearance: Normal appearance.  HENT:     Head: Normocephalic and atraumatic.  Eyes:     Extraocular Movements: Extraocular movements intact.     Conjunctiva/sclera: Conjunctivae normal.  Pulmonary:     Effort: Pulmonary effort is normal. No respiratory distress.  Abdominal:     General: There is no distension.     Palpations: Abdomen is soft.     Tenderness: There is no abdominal tenderness.  Musculoskeletal:     Comments: Left UE PICC line in place.   Skin:    General: Skin is warm and dry.     Findings: No rash.  Neurological:     General: No focal deficit present.     Mental Status: He is alert and oriented to person, place, and time.  Psychiatric:        Mood and Affect: Mood normal.        Behavior: Behavior normal.     Labs and Microbiology: CBC Latest Ref Rng & Units 07/27/2020 07/26/2020 07/16/2020  WBC 4.0 - 10.5 K/uL 5.3 6.7 10.0  Hemoglobin 13.0 - 17.0 g/dL 12.5(L) 12.7(L) 12.1(L)  Hematocrit 39.0 - 52.0 % 40.3 41.7 38.1(L)  Platelets 150 - 400 K/uL 254 247 252   CMP Latest Ref Rng & Units 07/27/2020 07/26/2020 07/16/2020  Glucose 70 - 99 mg/dL 105(H) 116(H) 106(H)  BUN 8 - 23 mg/dL 18 16 21   Creatinine 0.61 - 1.24 mg/dL 1.24 1.24 1.07  Sodium 135 - 145 mmol/L 137 138 138  Potassium 3.5 - 5.1 mmol/L 3.7 3.8 4.2  Chloride 98 - 111 mmol/L 103 105 105  CO2 22 - 32 mmol/L 25 25 24   Calcium 8.9 - 10.3 mg/dL 9.9 9.7 9.6  Total Protein 6.5 - 8.1 g/dL - - -  Total Bilirubin 0.3 - 1.2 mg/dL - - -  Alkaline Phos 38 - 126 U/L - - -  AST 15 - 41 U/L - - -  ALT 0 - 44 U/L - - -     Recent Results (from the past 240 hour(s))  Blood culture (routine x 2)     Status: None   Collection Time: 07/27/20  3:46 PM   Specimen: BLOOD  Result Value Ref Range Status   Specimen Description BLOOD LEFT ANTECUBITAL  Final   Special Requests   Final    BOTTLES DRAWN AEROBIC AND ANAEROBIC Blood Culture adequate volume   Culture   Final    NO GROWTH 5 DAYS Performed at [facility], [address] [place], [place] [address]    Report Status 08/01/2020 FINAL  Final    Imaging: CT [facility] 07/26/20  IMPRESSION:  Stable appearance of right-sided pyelonephritis and right renal  calculi. 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 No  evidence of ureteral calculi or hydronephrosis.   Stable 4 cm right renal sinus abscess. Previously seen small  subcapsular right renal abscesses have nearly completely resolved  since prior exam.   Prior prostatectomy.  No evidence of metastatic disease.   Cholelithiasis. No radiographic evidence of cholecystitis.    ASSESSMENT & PLAN:    Diarrhea Advised to monitor diarrhea closely and if does not improve to let us know.  Certainly would consider C diff if continues to worsen and would need testing at that point but for now it seems to be improving.   Renal abscess  Discussed with patient and his wife his clinical course thus far.  Fortunately, his subscapular renal abscesses at site of prior drain have essentially resolved from May to June imaging.  His renal sinus abscess remains relatively unchanged at ~4cm despite almost 12 weeks of IV ertapenem.  This particular collection has not been intervened upon previously with drainage.    We discussed 3 potential options today including 1. Stop ertapenem and remove PICC line with transition to PO omadacycline (TMP SMX and Cipro not good options with his allergies, comorbidity, drug interactions) pending urology follow up/repeat imaging; 2. Continue with Ertapenem as is; or 3. Stop all antibiotics and observe as test of cure since he has received 12 weeks of therapy and any residual collection at this point is likely sterile.  Would repeat imaging in a few weeks to ensure stability if option 3 undertaken.  Patient prefers to go with option #1 so he will finish off his course of ertapenem since he has 3 more doses at home and arrange for omadacycline to be delivered to the  house this weekend with a 14 day course.  He'll begin this antibiotic on Tuesday 7/5.  Picc will be pulled that day by home health.  After this course of PO therapy, if repeat imaging is stable, I would favor observing off antibiotics as test of cure since, as noted, he has been on antibiotics for quite some time and any further residual fluid collections are either sterile or, if still infected, would need surgery/drainage due to antibiotic failure.  I think getting off IV antibiotics and gettting PICC line out at this point is a good idea as well to avoid any further infectious complications.   No orders of the defined types were placed in this encounter.        Raynelle [facility] for Infectious [facility] 08/06/2020, 8:53 AM  I spent 40 minutes dedicated to the care of this patient on the date of this encounter to include pre-visit review of records, face-to-face time with the patient discussing diarrhea, renal abscess, and post-visit ordering of testing.

## 2020-08-06 ENCOUNTER — Other Ambulatory Visit: Payer: Self-pay | Admitting: Internal Medicine

## 2020-08-06 ENCOUNTER — Telehealth: Payer: Self-pay

## 2020-08-06 ENCOUNTER — Other Ambulatory Visit (HOSPITAL_COMMUNITY): Payer: Self-pay

## 2020-08-06 ENCOUNTER — Encounter: Payer: Self-pay | Admitting: Internal Medicine

## 2020-08-06 DIAGNOSIS — R197 Diarrhea, unspecified: Secondary | ICD-10-CM

## 2020-08-06 MED ORDER — OMADACYCLINE TOSYLATE 150 MG PO TABS: 300.0000 mg | ORAL_TABLET | Freq: Every day | ORAL | 0 refills | Status: DC

## 2020-08-06 MED ORDER — VANCOMYCIN HCL 125 MG PO CAPS: 125.0000 mg | ORAL_CAPSULE | Freq: Four times a day (QID) | ORAL | 0 refills | Status: DC

## 2020-08-06 NOTE — Telephone Encounter (Signed)
Yes, it was $5 at [facility]. Thank you!

## 2020-08-06 NOTE — Telephone Encounter (Addendum)
Called to get update on patient's [employer] application. Butch Penny was under the impression that the patient would be approved immediately and the medication would be shipped to them (she has done this many times). Spoke to [employer] at [employer] who told me to [facility]. So then I called [facility] and they advised me that [place] misinformed me and that wasn't the process at all. Spoke to [employer] a second time and he is unsure of how to get anything done and cannot guarantee patient will be approved or medication will be shipped before the holiday weekend.   I reached out to [place], our [place] field rep and she is going to call her manager to get this overridden and sent to the patient. She said that if she can't get it done through the proper channels, then she will deliver the medication to the patient herself in a brown paper bag. She assured me this would be taken care of, but I will follow up first thing on Tuesday.   Also potential for a drug interaction with omadacycline and warfarin (not well established but possible). Sent a message to his cardiology/coumadin team to let them know he is starting it in case they want to check his INR more frequently.

## 2020-08-06 NOTE — Telephone Encounter (Signed)
Patient's wife contacted triage to report the patient is having bright red blood from his rectum with most recent bathroom trip. Reported that it saturated his Depends after going to the bathroom. She also endorses he's experiencing weakness and continuing to feel unwell. Dr. Juleen China was notified of this and recommended he go to the ED for further evaluation and assessment.   Ms. Costanzo stated that he wouldn't be able to tolerate the trip and is concerned he wouldn't be seen for hours. She stated if he continues to worsen that she will call 911 for transport to the ED. Dr. Juleen China notified of her decision. She also stated she will be going to the pharmacy to pick up the oral Vancomycin that was prescribed.   Seab Axel Lorita Officer, RN

## 2020-08-06 NOTE — Telephone Encounter (Signed)
Family notified of medication being sent to pharmacy. Ms. Crossen stated it looked like it was over $300 for the prescription. RN instructed her to call our office before 5pm if at pick up that price is what occurs as our team found it for $5.   Carlean Purl, RN

## 2020-08-06 NOTE — Telephone Encounter (Signed)
Let me check..

## 2020-08-06 NOTE — Telephone Encounter (Signed)
Patient's wife called, states patient was up all night with continuous diarrhea. Per Dr. Alcario Drought note, may test patient for C.diff. Will route to provider.   Beryle Flock, RN

## 2020-08-06 NOTE — Addendum Note (Signed)
Addended by: Darletta Moll on: 08/06/2020 05:04 PM   Modules accepted: Orders

## 2020-08-06 NOTE — Telephone Encounter (Signed)
Spoke with patient's wife, they will come pick up the kit today. Explained collection procedure and drop-off timeline.   Beryle Flock, RN

## 2020-08-06 NOTE — Assessment & Plan Note (Signed)
Discussed with patient and his wife his clinical course thus far.  Fortunately, his subscapular renal abscesses at site of prior drain have essentially resolved from May to June imaging.  His renal sinus abscess remains relatively unchanged at ~4cm despite almost 12 weeks of IV ertapenem.  This particular collection has not been intervened upon previously with drainage.    We discussed 3 potential options today including 1. Stop ertapenem and remove PICC line with transition to PO omadacycline (TMP SMX and Cipro not good options with his allergies, comorbidity, drug interactions) pending urology follow up/repeat imaging; 2. Continue with Ertapenem as is; or 3. Stop all antibiotics and observe as test of cure since he has received 12 weeks of therapy and any residual collection at this point is likely sterile.  Would repeat imaging in a few weeks to ensure stability if option 3 undertaken.  Patient prefers to go with option #1 so he will finish off his course of ertapenem since he has 3 more doses at home and arrange for omadacycline to be delivered to the house this weekend with a 14 day course.  He'll begin this antibiotic on Tuesday 7/5.  Picc will be pulled that day by home health.  After this course of PO therapy, if repeat imaging is stable, I would favor observing off antibiotics as test of cure since, as noted, he has been on antibiotics for quite some time and any further residual fluid collections are either sterile or, if still infected, would need surgery/drainage due to antibiotic failure.  I think getting off IV antibiotics and gettting PICC line out at this point is a good idea as well to avoid any further infectious complications.

## 2020-08-06 NOTE — Telephone Encounter (Signed)
They are both covered under his insurance:  Vancomycin tablets 125 mg PO QID x 10 days would be $5.  Dificid 200 mg PO BID x 10 days would be $50.

## 2020-08-06 NOTE — Assessment & Plan Note (Signed)
Advised to monitor diarrhea closely and if does not improve to let us know.  Certainly would consider C diff if continues to worsen and would need testing at that point but for now it seems to be improving.

## 2020-08-08 ENCOUNTER — Other Ambulatory Visit: Payer: Self-pay

## 2020-08-08 ENCOUNTER — Inpatient Hospital Stay (HOSPITAL_COMMUNITY)
Admission: EM | Admit: 2020-08-08 | Discharge: 2020-08-25 | DRG: 371 | Disposition: A | Discharge status: Home Health | Payer: Medicare Other | Attending: Internal Medicine | Admitting: Internal Medicine

## 2020-08-08 ENCOUNTER — Observation Stay (HOSPITAL_COMMUNITY): Payer: Medicare Other

## 2020-08-08 ENCOUNTER — Telehealth: Payer: Self-pay | Admitting: Infectious Diseases

## 2020-08-08 DIAGNOSIS — D696 Thrombocytopenia, unspecified: Secondary | ICD-10-CM | POA: Diagnosis not present

## 2020-08-08 DIAGNOSIS — Z79899 Other long term (current) drug therapy: Secondary | ICD-10-CM

## 2020-08-08 DIAGNOSIS — Z20822 Contact with and (suspected) exposure to covid-19: Secondary | ICD-10-CM | POA: Diagnosis not present

## 2020-08-08 DIAGNOSIS — R112 Nausea with vomiting, unspecified: Secondary | ICD-10-CM | POA: Diagnosis not present

## 2020-08-08 DIAGNOSIS — Z952 Presence of prosthetic heart valve: Secondary | ICD-10-CM | POA: Diagnosis not present

## 2020-08-08 DIAGNOSIS — Z9079 Acquired absence of other genital organ(s): Secondary | ICD-10-CM

## 2020-08-08 DIAGNOSIS — R197 Diarrhea, unspecified: Secondary | ICD-10-CM | POA: Diagnosis not present

## 2020-08-08 DIAGNOSIS — R0902 Hypoxemia: Secondary | ICD-10-CM | POA: Diagnosis not present

## 2020-08-08 DIAGNOSIS — T82868A Thrombosis of vascular prosthetic devices, implants and grafts, initial encounter: Secondary | ICD-10-CM | POA: Diagnosis not present

## 2020-08-08 DIAGNOSIS — L304 Erythema intertrigo: Secondary | ICD-10-CM | POA: Diagnosis present

## 2020-08-08 DIAGNOSIS — A09 Infectious gastroenteritis and colitis, unspecified: Secondary | ICD-10-CM | POA: Diagnosis not present

## 2020-08-08 DIAGNOSIS — Z9989 Dependence on other enabling machines and devices: Secondary | ICD-10-CM

## 2020-08-08 DIAGNOSIS — N179 Acute kidney failure, unspecified: Secondary | ICD-10-CM | POA: Diagnosis not present

## 2020-08-08 DIAGNOSIS — I959 Hypotension, unspecified: Secondary | ICD-10-CM | POA: Diagnosis not present

## 2020-08-08 DIAGNOSIS — I2510 Atherosclerotic heart disease of native coronary artery without angina pectoris: Secondary | ICD-10-CM

## 2020-08-08 DIAGNOSIS — Z66 Do not resuscitate: Secondary | ICD-10-CM | POA: Diagnosis present

## 2020-08-08 DIAGNOSIS — Z86718 Personal history of other venous thrombosis and embolism: Secondary | ICD-10-CM

## 2020-08-08 DIAGNOSIS — Z96653 Presence of artificial knee joint, bilateral: Secondary | ICD-10-CM | POA: Diagnosis present

## 2020-08-08 DIAGNOSIS — R110 Nausea: Secondary | ICD-10-CM | POA: Diagnosis not present

## 2020-08-08 DIAGNOSIS — Z86711 Personal history of pulmonary embolism: Secondary | ICD-10-CM

## 2020-08-08 DIAGNOSIS — Z8619 Personal history of other infectious and parasitic diseases: Secondary | ICD-10-CM

## 2020-08-08 DIAGNOSIS — I10 Essential (primary) hypertension: Secondary | ICD-10-CM | POA: Diagnosis not present

## 2020-08-08 DIAGNOSIS — E876 Hypokalemia: Secondary | ICD-10-CM | POA: Diagnosis not present

## 2020-08-08 DIAGNOSIS — J4540 Moderate persistent asthma, uncomplicated: Secondary | ICD-10-CM | POA: Diagnosis present

## 2020-08-08 DIAGNOSIS — K219 Gastro-esophageal reflux disease without esophagitis: Secondary | ICD-10-CM | POA: Diagnosis present

## 2020-08-08 DIAGNOSIS — Z1624 Resistance to multiple antibiotics: Secondary | ICD-10-CM | POA: Diagnosis present

## 2020-08-08 DIAGNOSIS — I441 Atrioventricular block, second degree: Secondary | ICD-10-CM | POA: Diagnosis present

## 2020-08-08 DIAGNOSIS — I4819 Other persistent atrial fibrillation: Secondary | ICD-10-CM | POA: Diagnosis not present

## 2020-08-08 DIAGNOSIS — R32 Unspecified urinary incontinence: Secondary | ICD-10-CM | POA: Diagnosis not present

## 2020-08-08 DIAGNOSIS — A499 Bacterial infection, unspecified: Secondary | ICD-10-CM | POA: Diagnosis present

## 2020-08-08 DIAGNOSIS — Z885 Allergy status to narcotic agent status: Secondary | ICD-10-CM

## 2020-08-08 DIAGNOSIS — Z95828 Presence of other vascular implants and grafts: Secondary | ICD-10-CM

## 2020-08-08 DIAGNOSIS — A044 Other intestinal Escherichia coli infections: Secondary | ICD-10-CM | POA: Diagnosis not present

## 2020-08-08 DIAGNOSIS — T82838A Hemorrhage of vascular prosthetic devices, implants and grafts, initial encounter: Secondary | ICD-10-CM | POA: Diagnosis not present

## 2020-08-08 DIAGNOSIS — N1831 Chronic kidney disease, stage 3a: Secondary | ICD-10-CM | POA: Diagnosis not present

## 2020-08-08 DIAGNOSIS — Z950 Presence of cardiac pacemaker: Secondary | ICD-10-CM | POA: Diagnosis not present

## 2020-08-08 DIAGNOSIS — M7989 Other specified soft tissue disorders: Secondary | ICD-10-CM | POA: Diagnosis not present

## 2020-08-08 DIAGNOSIS — G4733 Obstructive sleep apnea (adult) (pediatric): Secondary | ICD-10-CM | POA: Diagnosis not present

## 2020-08-08 DIAGNOSIS — Y838 Other surgical procedures as the cause of abnormal reaction of the patient, or of later complication, without mention of misadventure at the time of the procedure: Secondary | ICD-10-CM | POA: Diagnosis present

## 2020-08-08 DIAGNOSIS — Z87891 Personal history of nicotine dependence: Secondary | ICD-10-CM

## 2020-08-08 DIAGNOSIS — Z1612 Extended spectrum beta lactamase (ESBL) resistance: Secondary | ICD-10-CM | POA: Diagnosis present

## 2020-08-08 DIAGNOSIS — F05 Delirium due to known physiological condition: Secondary | ICD-10-CM | POA: Diagnosis not present

## 2020-08-08 DIAGNOSIS — N151 Renal and perinephric abscess: Secondary | ICD-10-CM | POA: Diagnosis present

## 2020-08-08 DIAGNOSIS — Z8546 Personal history of malignant neoplasm of prostate: Secondary | ICD-10-CM

## 2020-08-08 DIAGNOSIS — E782 Mixed hyperlipidemia: Secondary | ICD-10-CM | POA: Diagnosis present

## 2020-08-08 DIAGNOSIS — R791 Abnormal coagulation profile: Secondary | ICD-10-CM | POA: Diagnosis present

## 2020-08-08 DIAGNOSIS — Z6833 Body mass index (BMI) 33.0-33.9, adult: Secondary | ICD-10-CM

## 2020-08-08 DIAGNOSIS — Z87442 Personal history of urinary calculi: Secondary | ICD-10-CM

## 2020-08-08 DIAGNOSIS — Z7901 Long term (current) use of anticoagulants: Secondary | ICD-10-CM

## 2020-08-08 DIAGNOSIS — I130 Hypertensive heart and chronic kidney disease with heart failure and stage 1 through stage 4 chronic kidney disease, or unspecified chronic kidney disease: Secondary | ICD-10-CM | POA: Diagnosis present

## 2020-08-08 DIAGNOSIS — I5042 Chronic combined systolic (congestive) and diastolic (congestive) heart failure: Secondary | ICD-10-CM | POA: Diagnosis present

## 2020-08-08 DIAGNOSIS — E669 Obesity, unspecified: Secondary | ICD-10-CM | POA: Diagnosis present

## 2020-08-08 DIAGNOSIS — N183 Chronic kidney disease, stage 3 unspecified: Secondary | ICD-10-CM | POA: Diagnosis present

## 2020-08-08 DIAGNOSIS — Z888 Allergy status to other drugs, medicaments and biological substances status: Secondary | ICD-10-CM

## 2020-08-08 DIAGNOSIS — B372 Candidiasis of skin and nail: Secondary | ICD-10-CM | POA: Diagnosis not present

## 2020-08-08 DIAGNOSIS — H9113 Presbycusis, bilateral: Secondary | ICD-10-CM | POA: Diagnosis present

## 2020-08-08 DIAGNOSIS — R109 Unspecified abdominal pain: Secondary | ICD-10-CM | POA: Diagnosis not present

## 2020-08-08 DIAGNOSIS — Z955 Presence of coronary angioplasty implant and graft: Secondary | ICD-10-CM

## 2020-08-08 LAB — CBC WITH DIFFERENTIAL/PLATELET
Abs Immature Granulocytes: 0.04 10*3/uL (ref 0.00–0.07)
Basophils Absolute: 0 10*3/uL (ref 0.0–0.1)
Basophils Relative: 0 %
Eosinophils Absolute: 1.4 10*3/uL — ABNORMAL HIGH (ref 0.0–0.5)
Eosinophils Relative: 13 %
HCT: 47.9 % (ref 39.0–52.0)
Hemoglobin: 14.8 g/dL (ref 13.0–17.0)
Immature Granulocytes: 0 %
Lymphocytes Relative: 11 %
Lymphs Abs: 1.2 10*3/uL (ref 0.7–4.0)
MCH: 25.7 pg — ABNORMAL LOW (ref 26.0–34.0)
MCHC: 30.9 g/dL (ref 30.0–36.0)
MCV: 83.3 fL (ref 80.0–100.0)
Monocytes Absolute: 0.8 10*3/uL (ref 0.1–1.0)
Monocytes Relative: 8 %
Neutro Abs: 6.8 10*3/uL (ref 1.7–7.7)
Neutrophils Relative %: 68 %
Platelets: 219 10*3/uL (ref 150–400)
RBC: 5.75 MIL/uL (ref 4.22–5.81)
RDW: 17.8 % — ABNORMAL HIGH (ref 11.5–15.5)
WBC: 10.2 10*3/uL (ref 4.0–10.5)
nRBC: 0 % (ref 0.0–0.2)

## 2020-08-08 LAB — C DIFFICILE QUICK SCREEN W PCR REFLEX
C Diff antigen: NEGATIVE
C Diff interpretation: NOT DETECTED
C Diff toxin: NEGATIVE

## 2020-08-08 LAB — COMPREHENSIVE METABOLIC PANEL
ALT: 6 U/L (ref 0–44)
AST: 17 U/L (ref 15–41)
Albumin: 3.3 g/dL — ABNORMAL LOW (ref 3.5–5.0)
Alkaline Phosphatase: 89 U/L (ref 38–126)
Anion gap: 10 (ref 5–15)
BUN: 20 mg/dL (ref 8–23)
CO2: 20 mmol/L — ABNORMAL LOW (ref 22–32)
Calcium: 9.5 mg/dL (ref 8.9–10.3)
Chloride: 110 mmol/L (ref 98–111)
Creatinine, Ser: 1.25 mg/dL — ABNORMAL HIGH (ref 0.61–1.24)
GFR, Estimated: 56 mL/min — ABNORMAL LOW (ref >60)
Glucose, Bld: 132 mg/dL — ABNORMAL HIGH (ref 70–99)
Potassium: 4.1 mmol/L (ref 3.5–5.1)
Sodium: 140 mmol/L (ref 135–145)
Total Bilirubin: 0.5 mg/dL (ref 0.3–1.2)
Total Protein: 7.3 g/dL (ref 6.5–8.1)

## 2020-08-08 LAB — PROTIME-INR
INR: 3.3 — ABNORMAL HIGH (ref 0.8–1.2)
Prothrombin Time: 33.5 seconds — ABNORMAL HIGH (ref 11.4–15.2)

## 2020-08-08 MED ORDER — OMEGA-3-ACID ETHYL ESTERS 1 G PO CAPS
1.0000 g | ORAL_CAPSULE | Freq: Every day | ORAL | Status: DC
  Administered 2020-08-09 – 2020-08-14 (×6): 1 g via ORAL
  Filled 2020-08-08 (×6): qty 1

## 2020-08-08 MED ORDER — ACETAMINOPHEN 500 MG PO TABS
500.0000 mg | ORAL_TABLET | Freq: Every day | ORAL | Status: DC
  Administered 2020-08-08 – 2020-08-09 (×3): 500 mg via ORAL
  Filled 2020-08-08 (×3): qty 1

## 2020-08-08 MED ORDER — VITAMIN D 25 MCG (1000 UNIT) PO TABS
1000.0000 [IU] | ORAL_TABLET | Freq: Every day | ORAL | Status: DC
  Administered 2020-08-09 – 2020-08-14 (×6): 1000 [IU] via ORAL
  Filled 2020-08-08 (×6): qty 1

## 2020-08-08 MED ORDER — CLOTRIMAZOLE 1 % EX CREA
TOPICAL_CREAM | Freq: Every day | CUTANEOUS | Status: DC
  Filled 2020-08-08: qty 15

## 2020-08-08 MED ORDER — ONDANSETRON 4 MG PO TBDP: 4.0000 mg | ORAL_TABLET | Freq: Three times a day (TID) | ORAL | 0 refills | Status: DC | PRN

## 2020-08-08 MED ORDER — CLOTRIMAZOLE 1 % EX CREA: TOPICAL_CREAM | Freq: Every day | CUTANEOUS | Status: DC

## 2020-08-08 MED ORDER — DIPHENOXYLATE-ATROPINE 2.5-0.025 MG PO TABS
1.0000 | ORAL_TABLET | Freq: Once | ORAL | Status: AC
  Administered 2020-08-08: 1 via ORAL
  Filled 2020-08-08: qty 1

## 2020-08-08 MED ORDER — ASCORBIC ACID 500 MG PO TABS
1000.0000 mg | ORAL_TABLET | Freq: Every day | ORAL | Status: DC
  Administered 2020-08-09 – 2020-08-14 (×6): 1000 mg via ORAL
  Filled 2020-08-08 (×6): qty 2

## 2020-08-08 MED ORDER — LACTATED RINGERS IV SOLN: INTRAVENOUS | Status: DC

## 2020-08-08 MED ORDER — PANTOPRAZOLE SODIUM 40 MG PO TBEC
40.0000 mg | DELAYED_RELEASE_TABLET | Freq: Every day | ORAL | Status: DC
  Administered 2020-08-09 – 2020-08-14 (×6): 40 mg via ORAL
  Filled 2020-08-08 (×7): qty 1

## 2020-08-08 MED ORDER — DIPHENOXYLATE-ATROPINE 2.5-0.025 MG PO TABS
1.0000 | ORAL_TABLET | Freq: Four times a day (QID) | ORAL | Status: DC
  Administered 2020-08-08 – 2020-08-14 (×21): 1 via ORAL
  Filled 2020-08-08 (×23): qty 1

## 2020-08-08 MED ORDER — SACCHAROMYCES BOULARDII 250 MG PO CAPS: 250.0000 mg | ORAL_CAPSULE | Freq: Two times a day (BID) | ORAL | Status: DC

## 2020-08-08 MED ORDER — OMADACYCLINE TOSYLATE 150 MG PO TABS: 300.0000 mg | ORAL_TABLET | Freq: Every day | ORAL | Status: DC

## 2020-08-08 MED ORDER — AMLODIPINE BESYLATE 5 MG PO TABS
2.5000 mg | ORAL_TABLET | Freq: Every day | ORAL | Status: DC
  Administered 2020-08-08 – 2020-08-17 (×9): 2.5 mg via ORAL
  Filled 2020-08-08 (×10): qty 1

## 2020-08-08 MED ORDER — CYANOCOBALAMIN 500 MCG PO TABS
500.0000 ug | ORAL_TABLET | Freq: Every day | ORAL | Status: DC
  Administered 2020-08-09 – 2020-08-14 (×6): 500 ug via ORAL
  Filled 2020-08-08 (×7): qty 1

## 2020-08-08 MED ORDER — ZINC OXIDE 12.8 % EX OINT
TOPICAL_OINTMENT | Freq: Once | CUTANEOUS | Status: AC
  Filled 2020-08-08 (×2): qty 56.7

## 2020-08-08 MED ORDER — CLOTRIMAZOLE 1 % EX CREA: TOPICAL_CREAM | CUTANEOUS | 0 refills | Status: DC

## 2020-08-08 MED ORDER — CLOTRIMAZOLE 1 % EX CREA
TOPICAL_CREAM | Freq: Once | CUTANEOUS | Status: AC
  Filled 2020-08-08 (×2): qty 15

## 2020-08-08 MED ORDER — FLORANEX PO PACK
1.0000 g | PACK | Freq: Three times a day (TID) | ORAL | Status: DC
  Administered 2020-08-09 – 2020-08-18 (×23): 1 g via ORAL
  Filled 2020-08-08 (×30): qty 1

## 2020-08-08 MED ORDER — ISOSORBIDE MONONITRATE ER 30 MG PO TB24
15.0000 mg | ORAL_TABLET | Freq: Every day | ORAL | Status: DC
Start: 2020-08-08 — End: 2020-08-25
  Administered 2020-08-08 – 2020-08-24 (×16): 15 mg via ORAL
  Filled 2020-08-08 (×17): qty 1

## 2020-08-08 MED ORDER — ZINC OXIDE 12.8 % EX OINT
TOPICAL_OINTMENT | Freq: Every day | CUTANEOUS | Status: DC
  Filled 2020-08-08 (×2): qty 56.7

## 2020-08-08 MED ORDER — DIPHENOXYLATE-ATROPINE 2.5-0.025 MG PO TABS: 1.0000 | ORAL_TABLET | Freq: Four times a day (QID) | ORAL | 0 refills | Status: DC | PRN

## 2020-08-08 MED ORDER — IOHEXOL 300 MG/ML  SOLN
100.0000 mL | Freq: Once | INTRAMUSCULAR | Status: AC | PRN
  Administered 2020-08-08: 100 mL via INTRAVENOUS

## 2020-08-08 MED ORDER — BACID PO TABS: 2.0000 | ORAL_TABLET | Freq: Three times a day (TID) | ORAL | Status: DC

## 2020-08-08 MED ORDER — PRAVASTATIN SODIUM 20 MG PO TABS
20.0000 mg | ORAL_TABLET | Freq: Every day | ORAL | Status: DC
  Administered 2020-08-08 – 2020-08-13 (×6): 20 mg via ORAL
  Filled 2020-08-08 (×7): qty 1

## 2020-08-08 MED ORDER — LACTATED RINGERS IV BOLUS
1000.0000 mL | Freq: Once | INTRAVENOUS | Status: AC
  Administered 2020-08-08: 1000 mL via INTRAVENOUS

## 2020-08-08 MED ORDER — WARFARIN - PHARMACIST DOSING INPATIENT: Freq: Every day | Status: DC

## 2020-08-08 NOTE — Progress Notes (Signed)
ANTICOAGULATION CONSULT NOTE - Initial Consult  Pharmacy Consult for Warfarin Indication: atrial fibrillation  Allergies  Allergen Reactions   Darifenacin Nausea Only and Other (See Comments)    dry mouth and dizziness ENABLEX Dizziness Pt denies   Codeine Other (See Comments)   Sulfamethoxazole-Trimethoprim Other (See Comments)   Lisinopril Cough    Pt denies    Patient Measurements: Height: 5\' 8"  (172.7 cm) Weight: 93.4 kg (206 lb) IBW/kg (Calculated) : 68.4 Heparin Dosing Weight:   Vital Signs: Temp: 97.6 F (36.4 C) (07/03 1950) Temp Source: Oral (07/03 1950) BP: 137/83 (07/03 1950) Pulse Rate: 67 (07/03 1950)  Labs: Recent Labs    08/08/20 1443  HGB 14.8  HCT 47.9  PLT 219  LABPROT 33.5*  INR 3.3*  CREATININE 1.25*    Estimated Creatinine Clearance: 46.2 mL/min (A) (by C-G formula based on SCr of 1.25 mg/dL (H)).   Medical History: Past Medical History:  Diagnosis Date   Aortic valve stenosis 04/05/2015   Formatting of this note might be different from the original. Overview:  ECHO 5/15 Formatting of this note might be different from the original. Overview:  Overview:  ECHO 5/15   Arthritis    "knees, right shoulder" (03/14/2017)   Atrial fibrillation, persistent ([place]) 09/19/2014   CAD (coronary artery disease), native coronary artery    Cath 2011 LHC (08/2009):~ Proximal LAD 30%, mid to distal LAD 25%, ostial small D1 75% mid AV groove circumflex 99% been subtotal stenosis, proximal to mid RCA 25-30%, mid RCA 30%, mid PDA 30%, EF 50% with inferior hypokinesis.;    July 2011  PCI and DES to circumflex Dr. Olevia Perches   ETT-Myoview (06/2013):  Inferolateral scar, mild peri-infarct ischemia, EF 40%; Intermediate Risk   ECHO EF 55% 03/2013    CAD (coronary artery disease), native coronary artery 04/05/2015   Cath 2011 LHC (08/2009):~ Proximal LAD 30%, mid to distal LAD 25%, ostial small D1 75% mid AV groove circumflex 99% been subtotal stenosis, proximal to mid RCA  25-30%, mid RCA 30%, mid PDA 30%, EF 50% with inferior hypokinesis.;    July 2011  PCI and DES to circumflex Dr. Olevia Perches   ETT-Myoview (06/2013):  Inferolateral scar, mild peri-infarct ischemia, EF 40%; Intermediate Risk   ECHO EF 55% 03/2013     Cardiac pacemaker in situ 03/14/2017   Biventricular St. Jude inserted 03/14/17 Dr. Lovena Le for second degree heart block    Chronic combined systolic and diastolic heart failure ([place]) 11/19/2017   CKD (chronic kidney disease), stage III ([place])    Gastro-esophageal reflux disease without esophagitis 09/24/2009   GERD    History of infection of prosthetic knee 04/05/2015   Treated with debridement and irrigation followed by 6 months of triple antibiotics   History of kidney stones    History of peptic ulcer 1970s?   History of prostate cancer    Radical prostatectomy in 2006 Dr. Terance Hart    Hypertensive heart disease    Internal hemorrhoids 09/11/2018   Long term (current) use of anticoagulants 10/06/2011   Mixed hyperlipidemia    Moderate persistent asthma without complication 9/60/4540   Noise effect on both inner ears 08/14/2018   Obesity (BMI 30-39.9)    OSA on CPAP    OSA treated with BiPAP 11/15/2015   Persistent atrial fibrillation ([place]) 09/19/2014   CHA2DS2VASC score 5  Cardioversion 10/08/2014 was on amiodarone until 2018    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)    Initial DVT  in 2006 after prostate surgery and had Greenfield filter placed Bilateral  PE 2013 and placed back on warfarin DVT of right subclavian vein on doppler 10/2012 at time of knee infection    Personal history of malignant neoplasm of prostate    Radical prostatectomy in 2006 Dr. Terance Hart     Presbycusis of both ears 08/14/2018   Severe aortic stenosis    Severe aortic stenosis 03/18/2018   Urinary incontinence    MULTIPLE BLADDER SURGERIES - STATES NO URINARY SPHINCTER - PT'S UROLOGIST IS AT [facility]- DR. PETERSON  ( LAST VISIT WAS 09/15/11 )    Medications:   Scheduled:   [START ON 08/09/2020] clotrimazole   Topical Daily   diphenoxylate-atropine  1 tablet Oral QID   saccharomyces boulardii  250 mg Oral BID   Zinc Oxide   Topical Daily    Assessment: Pharmacy is consulted to dose warfarin in 87 yo male with PMH of atrial fibrillation.   Pt listed home regimen is warfarin 2.5 daily except 1.25 mg daily on Wednesday and Saturday. Per med rec, LD was at 2130 on 7/2  Today,08/08/20 INR 3.3, supratherapeutic Hgb 14.8, plt 219    Goal of Therapy:  INR 2-3 Monitor platelets by anticoagulation protocol: Yes   Plan:  Hold warfarin dose today as INR slightly high  Daily INR Monitor for signs and symptoms of bleeding   Royetta Asal, PharmD, BCPS 08/08/2020 8:31 PM

## 2020-08-08 NOTE — ED Provider Notes (Signed)
[facility] DEPT Provider Note   CSN: [id number] Arrival date & time: 08/08/20  1256     History Chief Complaint  Patient presents with   Nausea    [name] is a 86 y.o. male.  HPI 87 year old male presents with diarrhea and today is having vomiting.  The patient has been having diarrhea since 6/27.  Is been waxing and waning but mostly is profuse.  Saw infectious disease for an appointment a couple days ago and was prescribed vancomycin which was started 2 nights ago.  No real change in the amount of diarrhea.  No significant blood.  Had some lower abdominal discomfort today but then had vomiting and now the abdominal pain is better.  He had some nausea and EMS gave Zofran and this is improved.  He feels generally weak.  Past Medical History:  Diagnosis Date   Aortic valve stenosis 04/05/2015   Formatting of this note might be different from the original. Overview:  ECHO 5/15 Formatting of this note might be different from the original. Overview:  Overview:  ECHO 5/15   Arthritis    "knees, right shoulder" (03/14/2017)   Atrial fibrillation, persistent ([place]) 09/19/2014   CAD (coronary artery disease), native coronary artery    Cath 2011 LHC (08/2009):~ Proximal LAD 30%, mid to distal LAD 25%, ostial small D1 75% mid AV groove circumflex 99% been subtotal stenosis, proximal to mid RCA 25-30%, mid RCA 30%, mid PDA 30%, EF 50% with inferior hypokinesis.;    July 2011  PCI and DES to circumflex Dr. Olevia Perches   ETT-Myoview (06/2013):  Inferolateral scar, mild peri-infarct ischemia, EF 40%; Intermediate Risk   ECHO EF 55% 03/2013    CAD (coronary artery disease), native coronary artery 04/05/2015   Cath 2011 LHC (08/2009):~ Proximal LAD 30%, mid to distal LAD 25%, ostial small D1 75% mid AV groove circumflex 99% been subtotal stenosis, proximal to mid RCA 25-30%, mid RCA 30%, mid PDA 30%, EF 50% with inferior hypokinesis.;    July 2011  PCI and DES to circumflex  Dr. Olevia Perches   ETT-Myoview (06/2013):  Inferolateral scar, mild peri-infarct ischemia, EF 40%; Intermediate Risk   ECHO EF 55% 03/2013     Cardiac pacemaker in situ 03/14/2017   Biventricular St. Jude inserted 03/14/17 Dr. Lovena Le for second degree heart block    Chronic combined systolic and diastolic heart failure ([place]) 11/19/2017   CKD (chronic kidney disease), stage III ([place])    Gastro-esophageal reflux disease without esophagitis 09/24/2009   GERD    History of infection of prosthetic knee 04/05/2015   Treated with debridement and irrigation followed by 6 months of triple antibiotics   History of kidney stones    History of peptic ulcer 1970s?   History of prostate cancer    Radical prostatectomy in 2006 Dr. Terance Hart    Hypertensive heart disease    Internal hemorrhoids 09/11/2018   Long term (current) use of anticoagulants 10/06/2011   Mixed hyperlipidemia    Moderate persistent asthma without complication 7/90/2409   Noise effect on both inner ears 08/14/2018   Obesity (BMI 30-39.9)    OSA on CPAP    OSA treated with BiPAP 11/15/2015   Persistent atrial fibrillation ([place]) 09/19/2014   CHA2DS2VASC score 5  Cardioversion 10/08/2014 was on amiodarone until 2018    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)    Initial DVT in 2006 after prostate surgery and had Greenfield filter placed Bilateral  PE 2013 and placed back on warfarin DVT of right subclavian vein on doppler 10/2012 at time of knee infection    Personal history of malignant neoplasm of prostate    Radical prostatectomy in 2006 Dr. Terance Hart     Presbycusis of both ears 08/14/2018   Severe aortic stenosis    Severe aortic stenosis 03/18/2018   Urinary incontinence    MULTIPLE BLADDER SURGERIES - STATES NO URINARY SPHINCTER - PT'S UROLOGIST IS AT [facility]- DR. PETERSON  ( LAST VISIT WAS 09/15/11 )    Patient Active Problem List   Diagnosis Date Noted   Diarrhea 08/06/2020   PICC (peripherally inserted central  catheter) in place 06/11/2020   Medication monitoring encounter 06/11/2020   Renal abscess    Perirectal abscess 05/15/2020   Pyelonephritis of right kidney 05/15/2020   Presence of IVC filter 05/15/2020   Constipation, chronic 05/15/2020   Cysts of right kidney 05/15/2020   Severe aortic stenosis    OSA on CPAP    History of prostate cancer    History of peptic ulcer    History of kidney stones    GERD    CAD (coronary artery disease), native coronary artery    Arthritis    Bulbous urethral stricture 12/03/2019   Internal hemorrhoids 09/11/2018   Noise effect on both inner ears 08/14/2018   Presbycusis of both ears 08/14/2018   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   CKD (chronic kidney disease), stage III ([facility])    Chronic combined systolic and diastolic heart failure ([place]) 11/19/2017   Cardiac pacemaker in situ 03/14/2017   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) 01/24/2016   OSA treated with BiPAP 11/15/2015   Personal history of malignant neoplasm of prostate 04/05/2015   Hypertensive heart disease 04/05/2015   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[place]) 04/05/2015   CAD (coronary artery disease), native coronary artery 04/05/2015   Aortic valve stenosis 04/05/2015   Persistent atrial fibrillation ([place]) 09/19/2014   Moderate persistent asthma without complication 16/11/9602   Atrial fibrillation, persistent ([place]) 09/19/2014   Angina pectoris ([place]) 09/18/2014   Urinary incontinence 03/04/2014   Obesity (BMI 30-39.9)    Long term (current) use of anticoagulants 10/06/2011   Mixed hyperlipidemia 02/24/2010   Gastro-esophageal reflux disease without esophagitis 09/24/2009    Past Surgical History:  Procedure Laterality Date   APPENDECTOMY     BI-VENTRICULAR PACEMAKER INSERTION (CRT-P)  03/14/2017   BIV PACEMAKER INSERTION CRT-P N/A 03/14/2017   Procedure: BIV PACEMAKER INSERTION CRT-P;  Surgeon: Evans Lance, MD;  Location:  [facility];  Service: Cardiovascular;  Laterality: N/A;   CARDIOVERSION N/A 10/08/2014   Procedure: CARDIOVERSION;  Surgeon: Jacolyn Reedy, MD;  Location: [facility];  Service: Cardiovascular;  Laterality: N/A;   CATARACT EXTRACTION W/ INTRAOCULAR LENS  IMPLANT, BILATERAL Bilateral    CORONARY ANGIOPLASTY WITH STENT PLACEMENT  08/25/2009   DES-mid LCx 08/2009; 30% pLAD, 25% m/dLAD, 75% ostial D1, 99% mLCx s/p DES, 25-30% p/mRCA, 40% mRCA, 30% mPDA stenoses; LVEF 50%, inf hypokinesis   CORONARY STENT INTERVENTION N/A 03/18/2018   Procedure: CORONARY STENT INTERVENTION;  Surgeon: Sherren Mocha, MD;  Location: [facility];  Service: Cardiovascular;  Laterality: N/A;   EXCISIONAL HEMORRHOIDECTOMY     I & D KNEE WITH POLY EXCHANGE Left 10/09/2012   Procedure: IRRIGATION AND DEBRIDEMENT LEFT KNEE WITH POLY REVISION;  Surgeon: Gearlean Alf, MD;  Location: [facility];  Service: Orthopedics;  Laterality: Left;  INCISION AND DRAINAGE ABSCESS N/A 05/16/2020   Procedure: INCISION AND DRAINAGE  PERIRECTAL ABSCESS;  Surgeon: Rolm Bookbinder, MD;  Location: [facility];  Service: General;  Laterality: N/A;   INGUINAL HERNIA REPAIR Right    INTRAOPERATIVE TRANSTHORACIC ECHOCARDIOGRAM  03/19/2018   Procedure: Intraoperative Transthoracic Echocardiogram;  Surgeon: Sherren Mocha, MD;  Location: [facility];  Service: Open Heart Surgery;;   IR FLUORO GUIDE CV LINE RIGHT  06/14/2020   IR RADIOLOGIST EVAL & MGMT  06/10/2020   JOINT REPLACEMENT     KNEE ARTHROTOMY Left 10/09/2012   Procedure: LEFT KNEE ARTHROTOMY;  Surgeon: Gearlean Alf, MD;  Location: [facility];  Service: Orthopedics;  Laterality: Left;   LEFT HEART CATH AND CORONARY ANGIOGRAPHY N/A 02/28/2018   Procedure: LEFT HEART CATH AND CORONARY ANGIOGRAPHY;  Surgeon: Sherren Mocha, MD;  Location: [facility];  Service: Cardiovascular;  Laterality: N/A;   LEFT HEART CATH AND CORONARY ANGIOGRAPHY N/A 09/12/2019   Procedure: LEFT HEART CATH AND  CORONARY ANGIOGRAPHY;  Surgeon: Sherren Mocha, MD;  Location: [facility];  Service: Cardiovascular;  Laterality: N/A;   PROSTATECTOMY  04/25/2004   REPLACEMENT TOTAL KNEE Bilateral    SKIN BIOPSY     "off nose; wasn't cancer; it was tested" (03/14/2017)   TRANSCATHETER AORTIC VALVE REPLACEMENT, TRANSFEMORAL N/A 03/19/2018   Procedure: TRANSCATHETER AORTIC VALVE REPLACEMENT, TRANSFEMORAL;  Surgeon: Sherren Mocha, MD;  Location: [facility];  Service: Open Heart Surgery;  Laterality: N/A;   Uretheral implants     multiple for incontinence   VENA CAVA FILTER PLACEMENT         Family History  Problem Relation Age of Onset   Melanoma Father    Melanoma Brother     Social History   Tobacco Use   Smoking status: Former    Packs/day: 1.00    Years: 10.00    Pack years: 10.00    Types: Cigarettes    Quit date: 04/27/1961    Years since quitting: 59.3   Smokeless tobacco: Never  Vaping Use   Vaping Use: Never used  Substance Use Topics   Alcohol use: No    Alcohol/week: 0.0 standard drinks   Drug use: No    Home Medications Prior to Admission medications   Medication Sig Start Date End Date Taking? Authorizing Provider  acetaminophen (TYLENOL) 500 MG tablet Take 500 mg by mouth at bedtime.    Yes [provider]  amLODipine (NORVASC) 2.5 MG tablet Take 1 tablet (2.5 mg total) by mouth daily. 09/03/19  Yes Tobb, Kardie, DO  Ascorbic Acid (VITAMIN C) 1000 MG tablet Take 1,000 mg by mouth daily.   Yes [provider]  Biotin 2500 MCG CAPS Take 2,500 mcg by mouth daily.   Yes [provider]  cholecalciferol (VITAMIN D) 1000 UNITS tablet Take 1,000 Units by mouth daily.   Yes [provider]  clotrimazole (LOTRIMIN) 1 % cream Apply to affected area 2 times daily 08/08/20  Yes Sherwood Gambler, MD  docusate sodium (COLACE) 50 MG capsule Take 50 mg by mouth daily.   Yes [provider]  furosemide (LASIX) 20 MG tablet Take 1 tablet (20 mg  total) by mouth daily. 06/02/20  Yes Richardo Priest, MD  Glucosamine HCl (GLUCOSAMINE PO) Take 2,000 mg by mouth daily.   Yes [provider]  isosorbide mononitrate (IMDUR) 30 MG 24 hr tablet Take 0.5 tablets (15 mg total) by mouth daily. 02/05/20  Yes Richardo Priest, MD  Multiple  Vitamin (MULTIVITAMIN) capsule Take 1 capsule by mouth daily.  04/28/19  Yes [provider]  nitroGLYCERIN (NITROSTAT) 0.4 MG SL tablet Place 1 tablet (0.4 mg total) under the tongue every 5 (five) minutes as needed. 07/14/19  Yes Richardo Priest, MD  Omega-3 Fatty Acids (FISH OIL) 1000 MG CAPS Take 1,000 mg by mouth daily.   Yes [provider]  pantoprazole (PROTONIX) 40 MG tablet Take 1 tablet (40 mg total) by mouth daily at 6 (six) AM. 05/29/20  Yes Sheikh, [place], DO  pravastatin (PRAVACHOL) 20 MG tablet TAKE ONE TABLET BY MOUTH ONCE DAILY Patient taking differently: Take 20 mg by mouth daily. 01/08/20  Yes Richardo Priest, MD  vancomycin (VANCOCIN) 125 MG capsule Take 1 capsule (125 mg total) by mouth 4 (four) times daily for 10 days. 08/06/20 08/16/20 Yes Mignon Pine, DO  vitamin B-12 (CYANOCOBALAMIN) 500 MCG tablet Take 500 mcg by mouth daily.   Yes [provider]  warfarin (COUMADIN) 2.5 MG tablet Take 1 tablet daily or as directed by [facility] Patient taking differently: Take 2.5 mg by mouth daily. 1.25mg  on Wednesday and Saturday, 2.5mg  all other days 04/16/20  Yes Richardo Priest, MD  diphenoxylate-atropine (LOMOTIL) 2.5-0.025 MG tablet Take 1 tablet by mouth 4 (four) times daily as needed for diarrhea or loose stools. 08/08/20   Sherwood Gambler, MD  Omadacycline Tosylate 150 MG TABS Take 300 mg by mouth daily. 08/06/20   Kuppelweiser, Cassie L, RPH-CPP  ondansetron (ZOFRAN ODT) 4 MG disintegrating tablet Take 1 tablet (4 mg total) by mouth every 8 (eight) hours as needed for nausea or vomiting. 08/08/20   Sherwood Gambler, MD  polyethylene glycol (MIRALAX / GLYCOLAX) 17  g packet Take 17 g by mouth 2 (two) times daily. Patient not taking: Reported on 08/08/2020 05/28/20   Raiford Noble Latif, DO    Allergies    Darifenacin, Codeine, Sulfamethoxazole-trimethoprim, and Lisinopril  Review of Systems   Review of Systems  Constitutional:  Negative for fever.  Cardiovascular:  Negative for chest pain.  Gastrointestinal:  Positive for abdominal pain, diarrhea, nausea and vomiting. Negative for blood in stool.  Genitourinary:  Negative for dysuria.  Neurological:  Positive for weakness.  All other systems reviewed and are negative.  Physical Exam Updated Vital Signs BP (!) 150/66 (BP Location: Right Arm)   Pulse (!) 59   Temp 97.7 F (36.5 C) (Oral)   Resp 15   Ht 5\' 8"  (1.727 m)   Wt 93.4 kg   SpO2 100%   BMI 31.32 kg/m   Physical Exam Vitals and nursing note reviewed.  Constitutional:      General: He is not in acute distress.    Appearance: He is well-developed. He is not ill-appearing or diaphoretic.  HENT:     Head: Normocephalic and atraumatic.     Right Ear: External ear normal.     Left Ear: External ear normal.     Nose: Nose normal.  Eyes:     General:        Right eye: No discharge.        Left eye: No discharge.  Cardiovascular:     Rate and Rhythm: Normal rate and regular rhythm.     Heart sounds: Normal heart sounds.  Pulmonary:     Effort: Pulmonary effort is normal.     Breath sounds: Normal breath sounds.  Abdominal:     General: There is no distension.     Palpations:  Abdomen is soft.     Tenderness: There is no abdominal tenderness.  Musculoskeletal:     Cervical back: Neck supple.  Skin:    General: Skin is warm and dry.     Findings: Erythema present.     Comments: Scrotal erythema and tenderness. C/w intertrigo. No sloughing  Neurological:     Mental Status: He is alert.  Psychiatric:        Mood and Affect: Mood is not anxious.    ED Results / Procedures / Treatments   Labs (all labs ordered are listed,  but only abnormal results are displayed) Labs Reviewed  COMPREHENSIVE METABOLIC PANEL - Abnormal; Notable for the following components:      Result Value   CO2 20 (*)    Glucose, Bld 132 (*)    Creatinine, Ser 1.25 (*)    Albumin 3.3 (*)    GFR, Estimated 56 (*)    All other components within normal limits  CBC WITH DIFFERENTIAL/PLATELET - Abnormal; Notable for the following components:   MCH 25.7 (*)    RDW 17.8 (*)    Eosinophils Absolute 1.4 (*)    All other components within normal limits  PROTIME-INR - Abnormal; Notable for the following components:   Prothrombin Time 33.5 (*)    INR 3.3 (*)    All other components within normal limits  C DIFFICILE QUICK SCREEN W PCR REFLEX    GASTROINTESTINAL PANEL BY PCR, STOOL (REPLACES STOOL CULTURE)  SARS CORONAVIRUS 2 (TAT 6-24 HRS)    EKG EKG Interpretation  Date/Time:  Sunday August 08 2020 14:57:27 EDT Ventricular Rate:  64 PR Interval:    QRS Duration: 111 QT Interval:  397 QTC Calculation: 410 R Axis:   -85 Text Interpretation: paced rhythm Anterolateral infarct, age indeterminate Confirmed by Sherwood Gambler [id number]) on 08/08/2020 3:23:26 PM  Radiology No results found.  Procedures Procedures   Medications Ordered in ED Medications  lactated ringers bolus 1,000 mL (0 mLs Intravenous Stopped 08/08/20 1903)  diphenoxylate-atropine (LOMOTIL) 2.5-0.025 MG per tablet 1 tablet (1 tablet Oral Given 08/08/20 1649)  clotrimazole (LOTRIMIN) 1 % cream ( Topical Given 08/08/20 1905)  Zinc Oxide (TRIPLE PASTE) 12.8 % ointment ( Topical Given 08/08/20 1906)    ED Course  I have reviewed the triage vital signs and the nursing notes.  Pertinent labs & imaging results that were available during my care of the patient were reviewed by me and considered in my medical decision making (see chart for details).    MDM Rules/Calculators/A&P                          Patient's abdominal exam is benign.  His labs are actually well besides a mildly  supratherapeutic INR.  I discussed with Dr. Johnnye Sima, and it would be okay for antimotility agent since he is on the vancomycin.  Otherwise, the patient seems to be doing well but then has progressively had more and more diarrhea while in the emergency department.  He is at over 10 different episodes that have had to be changed.  Unfortunately he is also unable to change it himself and so his wife is the one doing it.  She cannot handle this at home currently.  His exam is also consistent with a significant intertrigo and with this and poor hygiene from the diarrhea I think he will need to be observed with consideration for further management of his diarrhea.  I am not sure  he and his wife can take care of it at home at this point while its this bad.  His C. difficile testing ended up coming back negative.  GI pathogen panel is pending.  Will need to be kept on fluids.  Discussed with Dr. Flossie Buffy for admission. Final Clinical Impression(s) / ED Diagnoses Final diagnoses:  Infectious diarrhea  Intertrigo    Rx / DC Orders ED Discharge Orders          Ordered    diphenoxylate-atropine (LOMOTIL) 2.5-0.025 MG tablet  4 times daily PRN,   Status:  Discontinued        08/08/20 1638    ondansetron (ZOFRAN ODT) 4 MG disintegrating tablet  Every 8 hours PRN,   Status:  Discontinued        08/08/20 1638    diphenoxylate-atropine (LOMOTIL) 2.5-0.025 MG tablet  4 times daily PRN        08/08/20 1803    ondansetron (ZOFRAN ODT) 4 MG disintegrating tablet  Every 8 hours PRN        08/08/20 1803    clotrimazole (LOTRIMIN) 1 % cream        08/08/20 1803             Sherwood Gambler, MD 08/08/20 1943

## 2020-08-08 NOTE — H&P (Signed)
History and Physical    [name] PNT:[id number] DOB: 1931/11/12 DOA: 08/08/2020  PCP: Shirline Frees, MD  Patient coming from: Home, wife at bedside  I have personally briefly reviewed patient's old medical records in [place]  Chief Complaint: persistent diarrhea and vomiting  HPI: [name] is a 87 y.o. male with medical history significant for CAD, biventricular cardiac pacemaker, s/p TAVR, persistent atrial fibrillation on Coumadin, combined systolic and diastolic HF, HLD, HTN, OSA on CPAP, hx of DVT/PE s/p IVC, hx of prostate cancer s/p radical prostatectomy who presents with persistent diarrhea and nausea.   Patient recently had a prolonged infection course with a right renal subcapsular abscess secondary to ESBL in the setting of nonobstructing nephrolithiasis.  He has been followed by urology and infectious disease and was on over 12 weeks of IV ertapenem which was completed today. Starting 6/27 patient began to have diarrhea that has progressively worsened.  He was then started on oral vancomycin on 7/1 by ID for presumptive C. difficile while awaiting testing.  However he continued to have worsening profuse watery mucus-like diarrhea and was up all night.  Today also developed nausea and vomiting for the first time.  He also notes some diffuse abdominal pain.  Denies any fever.  There has been some blood mixed in with the diarrhea but wife thinks it could be from the skin around his testicular region being that it is raw from his profuse watery diarrhea.  ED Course: He was afebrile and normotensive on room air.  No leukocytosis but WBC has increased from 10k from 5k 2 weeks ago.  Hemoglobin of 14.8 also appears hemoconcentrated.  Creatinine of 1.25.  BG of 132. C. difficile panel was negative.  Patient was given fluids and Lomotil and hospitalist was called for admission for observation since spouse is having difficulty caring for patient with these  persistent symptoms.  Review of Systems: Constitutional: No Weight Change, No Fever ENT/Mouth: No sore throat, No Rhinorrhea Eyes: No Eye Pain, No Vision Changes Cardiovascular: No Chest Pain, no SOB Respiratory: No Cough, No Sputum Gastrointestinal: + Nausea, + Vomiting, + Diarrhea, No Constipation, No Pain Genitourinary: no Urinary Incontinence, No Urgency, No Flank Pain Musculoskeletal: No Arthralgias, No Myalgias Skin: No Skin Lesions, No Pruritus, Neuro: no Weakness, No Numbness Psych: No Anxiety/Panic, No Depression, no decrease appetite Heme/Lymph: No Bruising, No Bleeding  Past Medical History:  Diagnosis Date   Aortic valve stenosis 04/05/2015   Formatting of this note might be different from the original. Overview:  ECHO 5/15 Formatting of this note might be different from the original. Overview:  Overview:  ECHO 5/15   Arthritis    "knees, right shoulder" (03/14/2017)   Atrial fibrillation, persistent ([place]) 09/19/2014   CAD (coronary artery disease), native coronary artery    Cath 2011 LHC (08/2009):~ Proximal LAD 30%, mid to distal LAD 25%, ostial small D1 75% mid AV groove circumflex 99% been subtotal stenosis, proximal to mid RCA 25-30%, mid RCA 30%, mid PDA 30%, EF 50% with inferior hypokinesis.;    July 2011  PCI and DES to circumflex Dr. Olevia Perches   ETT-Myoview (06/2013):  Inferolateral scar, mild peri-infarct ischemia, EF 40%; Intermediate Risk   ECHO EF 55% 03/2013    CAD (coronary artery disease), native coronary artery 04/05/2015   Cath 2011 [facility] (08/2009):~ Proximal LAD 30%, mid to distal LAD 25%, ostial small D1 75% mid AV groove circumflex 99% been subtotal stenosis, proximal to mid RCA 25-30%, mid RCA  30%, mid PDA 30%, EF 50% with inferior hypokinesis.;    July 2011  PCI and DES to circumflex Dr. Olevia Perches   ETT-Myoview (06/2013):  Inferolateral scar, mild peri-infarct ischemia, EF 40%; Intermediate Risk   ECHO EF 55% 03/2013     Cardiac pacemaker in situ 03/14/2017    Biventricular [employer] inserted 03/14/17 Dr. Lovena Le for second degree heart block    Chronic combined systolic and diastolic heart failure ([place]) 11/19/2017   CKD (chronic kidney disease), stage III ([place])    Gastro-esophageal reflux disease without esophagitis 09/24/2009   GERD    History of infection of prosthetic knee 04/05/2015   Treated with debridement and irrigation followed by 6 months of triple antibiotics   History of kidney stones    History of peptic ulcer 1970s?   History of prostate cancer    Radical prostatectomy in 2006 Dr. Terance Hart    Hypertensive heart disease    Internal hemorrhoids 09/11/2018   Long term (current) use of anticoagulants 10/06/2011   Mixed hyperlipidemia    Moderate persistent asthma without complication 5/39/7673   Noise effect on both inner ears 08/14/2018   Obesity (BMI 30-39.9)    OSA on CPAP    OSA treated with BiPAP 11/15/2015   Persistent atrial fibrillation ([place]) 09/19/2014   CHA2DS2VASC score 5  Cardioversion 10/08/2014 was on amiodarone until 2018    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)    Initial DVT in 2006 after prostate surgery and had [place] filter placed Bilateral  PE 2013 and placed back on warfarin DVT of right subclavian vein on doppler 10/2012 at time of knee infection    Personal history of malignant neoplasm of prostate    Radical prostatectomy in 2006 Dr. Terance Hart     Presbycusis of both ears 08/14/2018   Severe aortic stenosis    Severe aortic stenosis 03/18/2018   Urinary incontinence    MULTIPLE BLADDER SURGERIES - STATES NO URINARY SPHINCTER - PT'S UROLOGIST IS AT [facility]- DR. PETERSON  ( LAST VISIT WAS 09/15/11 )    Past Surgical History:  Procedure Laterality Date   APPENDECTOMY     BI-VENTRICULAR PACEMAKER INSERTION (CRT-P)  03/14/2017   BIV PACEMAKER INSERTION CRT-P N/A 03/14/2017   Procedure: BIV PACEMAKER INSERTION CRT-P;  Surgeon: Evans Lance, MD;  Location: [facility];  Service:  Cardiovascular;  Laterality: N/A;   CARDIOVERSION N/A 10/08/2014   Procedure: CARDIOVERSION;  Surgeon: Jacolyn Reedy, MD;  Location: [facility];  Service: Cardiovascular;  Laterality: N/A;   CATARACT EXTRACTION W/ INTRAOCULAR LENS  IMPLANT, BILATERAL Bilateral    CORONARY ANGIOPLASTY WITH STENT PLACEMENT  08/25/2009   DES-mid LCx 08/2009; 30% pLAD, 25% m/dLAD, 75% ostial D1, 99% mLCx s/p DES, 25-30% p/mRCA, 40% mRCA, 30% mPDA stenoses; LVEF 50%, inf hypokinesis   CORONARY STENT INTERVENTION N/A 03/18/2018   Procedure: CORONARY STENT INTERVENTION;  Surgeon: Sherren Mocha, MD;  Location: [facility];  Service: Cardiovascular;  Laterality: N/A;   EXCISIONAL HEMORRHOIDECTOMY     I & D KNEE WITH POLY EXCHANGE Left 10/09/2012   Procedure: IRRIGATION AND DEBRIDEMENT LEFT KNEE WITH POLY REVISION;  Surgeon: Gearlean Alf, MD;  Location: [facility];  Service: Orthopedics;  Laterality: Left;   INCISION AND DRAINAGE ABSCESS N/A 05/16/2020   Procedure: INCISION AND DRAINAGE  PERIRECTAL ABSCESS;  Surgeon: Rolm Bookbinder, MD;  Location: [facility];  Service: General;  Laterality: N/A;   INGUINAL HERNIA REPAIR Right  INTRAOPERATIVE TRANSTHORACIC ECHOCARDIOGRAM  03/19/2018   Procedure: Intraoperative Transthoracic Echocardiogram;  Surgeon: Sherren Mocha, MD;  Location: [facility];  Service: Open Heart Surgery;;   IR FLUORO GUIDE CV LINE RIGHT  06/14/2020   IR RADIOLOGIST EVAL & MGMT  06/10/2020   JOINT REPLACEMENT     KNEE ARTHROTOMY Left 10/09/2012   Procedure: LEFT KNEE ARTHROTOMY;  Surgeon: Gearlean Alf, MD;  Location: [facility];  Service: Orthopedics;  Laterality: Left;   LEFT HEART CATH AND CORONARY ANGIOGRAPHY N/A 02/28/2018   Procedure: LEFT HEART CATH AND CORONARY ANGIOGRAPHY;  Surgeon: Sherren Mocha, MD;  Location: [facility];  Service: Cardiovascular;  Laterality: N/A;   LEFT HEART CATH AND CORONARY ANGIOGRAPHY N/A 09/12/2019   Procedure: LEFT HEART CATH AND CORONARY ANGIOGRAPHY;  Surgeon:  Sherren Mocha, MD;  Location: [facility];  Service: Cardiovascular;  Laterality: N/A;   PROSTATECTOMY  04/25/2004   REPLACEMENT TOTAL KNEE Bilateral    SKIN BIOPSY     "off nose; wasn't cancer; it was tested" (03/14/2017)   TRANSCATHETER AORTIC VALVE REPLACEMENT, TRANSFEMORAL N/A 03/19/2018   Procedure: TRANSCATHETER AORTIC VALVE REPLACEMENT, TRANSFEMORAL;  Surgeon: Sherren Mocha, MD;  Location: [facility];  Service: Open Heart Surgery;  Laterality: N/A;   Uretheral implants     multiple for incontinence   VENA CAVA FILTER PLACEMENT       reports that he quit smoking about 59 years ago. His smoking use included cigarettes. He has a 10.00 pack-year smoking history. He has never used smokeless tobacco. He reports that he does not drink alcohol and does not use drugs. Social History  Allergies  Allergen Reactions   Darifenacin Nausea Only and Other (See Comments)    dry mouth and dizziness ENABLEX Dizziness Pt denies   Codeine Other (See Comments)   Sulfamethoxazole-Trimethoprim Other (See Comments)   Lisinopril Cough    Pt denies    Family History  Problem Relation Age of Onset   Melanoma Father    Melanoma Brother      Prior to Admission medications   Medication Sig Start Date End Date Taking? Authorizing Provider  acetaminophen (TYLENOL) 500 MG tablet Take 500 mg by mouth at bedtime.    Yes [provider]  amLODipine (NORVASC) 2.5 MG tablet Take 1 tablet (2.5 mg total) by mouth daily. 09/03/19  Yes Tobb, Kardie, DO  Ascorbic Acid (VITAMIN C) 1000 MG tablet Take 1,000 mg by mouth daily.   Yes [provider]  Biotin 2500 MCG CAPS Take 2,500 mcg by mouth daily.   Yes [provider]  cholecalciferol (VITAMIN D) 1000 UNITS tablet Take 1,000 Units by mouth daily.   Yes [provider]  clotrimazole (LOTRIMIN) 1 % cream Apply to affected area 2 times daily 08/08/20  Yes Sherwood Gambler, MD  docusate sodium (COLACE) 50 MG capsule Take 50 mg by  mouth daily.   Yes [provider]  furosemide (LASIX) 20 MG tablet Take 1 tablet (20 mg total) by mouth daily. 06/02/20  Yes Richardo Priest, MD  Glucosamine HCl (GLUCOSAMINE PO) Take 2,000 mg by mouth daily.   Yes [provider]  isosorbide mononitrate (IMDUR) 30 MG 24 hr tablet Take 0.5 tablets (15 mg total) by mouth daily. 02/05/20  Yes Richardo Priest, MD  Multiple Vitamin (MULTIVITAMIN) capsule Take 1 capsule by mouth daily.  04/28/19  Yes [provider]  nitroGLYCERIN (NITROSTAT) 0.4 MG SL tablet Place 1 tablet (0.4 mg total) under the tongue every 5 (  five) minutes as needed. 07/14/19  Yes Richardo Priest, MD  Omega-3 Fatty Acids (FISH OIL) 1000 MG CAPS Take 1,000 mg by mouth daily.   Yes [provider]  pantoprazole (PROTONIX) 40 MG tablet Take 1 tablet (40 mg total) by mouth daily at 6 (six) AM. 05/29/20  Yes Sheikh, [place], DO  pravastatin (PRAVACHOL) 20 MG tablet TAKE ONE TABLET BY MOUTH ONCE DAILY Patient taking differently: Take 20 mg by mouth daily. 01/08/20  Yes Richardo Priest, MD  vancomycin (VANCOCIN) 125 MG capsule Take 1 capsule (125 mg total) by mouth 4 (four) times daily for 10 days. 08/06/20 08/16/20 Yes Mignon Pine, DO  vitamin B-12 (CYANOCOBALAMIN) 500 MCG tablet Take 500 mcg by mouth daily.   Yes [provider]  warfarin (COUMADIN) 2.5 MG tablet Take 1 tablet daily or as directed by [facility] Patient taking differently: Take 2.5 mg by mouth daily. 1.25mg  on Wednesday and Saturday, 2.5mg  all other days 04/16/20  Yes Richardo Priest, MD  diphenoxylate-atropine (LOMOTIL) 2.5-0.025 MG tablet Take 1 tablet by mouth 4 (four) times daily as needed for diarrhea or loose stools. 08/08/20   Sherwood Gambler, MD  Omadacycline Tosylate 150 MG TABS Take 300 mg by mouth daily. 08/06/20   Kuppelweiser, Cassie L, RPH-CPP  ondansetron (ZOFRAN ODT) 4 MG disintegrating tablet Take 1 tablet (4 mg total) by mouth every 8 (eight) hours as needed  for nausea or vomiting. 08/08/20   Sherwood Gambler, MD  polyethylene glycol (MIRALAX / GLYCOLAX) 17 g packet Take 17 g by mouth 2 (two) times daily. Patient not taking: Reported on 08/08/2020 05/28/20   Kerney Elbe, DO    Physical Exam: Vitals:   08/08/20 1645 08/08/20 1729 08/08/20 1826 08/08/20 1950  BP:   (!) 150/66 137/83  Pulse: 60 (!) 59 (!) 59 67  Resp:   15 14  Temp:   97.7 F (36.5 C) 97.6 F (36.4 C)  TempSrc:   Oral Oral  SpO2:   100% 100%  Weight:      Height:        Constitutional: uncomfortable elderly male getting clean by nursing and moaning in pain from erythematous genitalia  Vitals:   08/08/20 1645 08/08/20 1729 08/08/20 1826 08/08/20 1950  BP:   (!) 150/66 137/83  Pulse: 60 (!) 59 (!) 59 67  Resp:   15 14  Temp:   97.7 F (36.5 C) 97.6 F (36.4 C)  TempSrc:   Oral Oral  SpO2:   100% 100%  Weight:      Height:       Eyes: PERRL, lids and conjunctivae normal ENMT: Mucous membranes are moist.  Neck: normal, supple Respiratory: clear to auscultation bilaterally, no wheezing, no crackles. Normal respiratory effort. No accessory muscle use.  Cardiovascular: Regular rate and rhythm, no murmurs / rubs / gallops. No extremity edema.  Abdomen: diffuse tenderness with guarding, no rebound tenderness or rigidity, no masses palpated. . Bowel sounds positive.  GU: Erythema around entire scrotal and penile region with large stool burden on rectal region.  Patient in significant pain during cleaning of the genital region by nursing. No bright red blood per rectum. Musculoskeletal: no clubbing / cyanosis. No joint deformity upper and lower extremities. Good ROM, no contractures. Normal muscle tone.  Skin: no rashes, lesions, ulcers. No induration Neurologic: CN 2-12 grossly intact. Sensation intact,  Strength 5/5 in all 4.  Psychiatric: Normal judgment and insight. Alert and oriented x 3. Normal mood.  Labs on Admission: I have personally reviewed following labs  and imaging studies  CBC: Recent Labs  Lab 08/08/20 1443  WBC 10.2  NEUTROABS 6.8  HGB 14.8  HCT 47.9  MCV 83.3  PLT 627   Basic Metabolic Panel: Recent Labs  Lab 08/08/20 1443  NA 140  K 4.1  CL 110  CO2 20*  GLUCOSE 132*  BUN 20  CREATININE 1.25*  CALCIUM 9.5   GFR: Estimated Creatinine Clearance: 46.2 mL/min (A) (by C-G formula based on SCr of 1.25 mg/dL (H)). Liver Function Tests: Recent Labs  Lab 08/08/20 1443  AST 17  ALT 6  ALKPHOS 89  BILITOT 0.5  PROT 7.3  ALBUMIN 3.3*   No results for input(s): LIPASE, AMYLASE in the last 168 hours. No results for input(s): AMMONIA in the last 168 hours. Coagulation Profile: Recent Labs  Lab 08/04/20 0000 08/08/20 1443  INR 2.5 3.3*   Cardiac Enzymes: No results for input(s): CKTOTAL, CKMB, CKMBINDEX, TROPONINI in the last 168 hours. BNP (last 3 results) No results for input(s): PROBNP in the last 8760 hours. HbA1C: No results for input(s): HGBA1C in the last 72 hours. CBG: No results for input(s): GLUCAP in the last 168 hours. Lipid Profile: No results for input(s): CHOL, HDL, LDLCALC, TRIG, CHOLHDL, LDLDIRECT in the last 72 hours. Thyroid Function Tests: No results for input(s): TSH, T4TOTAL, FREET4, T3FREE, THYROIDAB in the last 72 hours. Anemia Panel: No results for input(s): VITAMINB12, FOLATE, FERRITIN, TIBC, IRON, RETICCTPCT in the last 72 hours. Urine analysis:    Component Value Date/Time   COLORURINE YELLOW 05/15/2020 2307   APPEARANCEUR CLEAR 05/15/2020 2307   LABSPEC 1.033 (H) 05/15/2020 2307   PHURINE 7.0 05/15/2020 2307   GLUCOSEU NEGATIVE 05/15/2020 2307   HGBUR NEGATIVE 05/15/2020 2307   [place] 05/15/2020 2307   KETONESUR NEGATIVE 05/15/2020 2307   PROTEINUR NEGATIVE 05/15/2020 2307   UROBILINOGEN 0.2 09/19/2014 1027   NITRITE NEGATIVE 05/15/2020 2307   LEUKOCYTESUR NEGATIVE 05/15/2020 2307    Radiological Exams on Admission: No results  found.    Assessment/Plan  Persistent diarrhea Now with new nausea and vomiting -pt recently underwent over 12 weeks of IV ertrapenem for a right renal subcapsular abscess secondary to ESBL in the setting of nonobstructing nephrolithiasis. -C diff toxin and antigen negative -WBC at the upper limits of normal but have risen since 2 weeks ago, however he also has heme-concentration on lab so I suspect this elevation is due to hypovolemia. I do not think this is infectious but likely due to his gut flora being affected by prolonged IV antibiotics. However he does have guarding on abdominal exam and with new nausea and hx of multiple abdominal surgery, will obtain CT abdomen to rule out obstruction.  -Continuous IV LR fluids -Lomotil QID -probiotics daily  Hx of Right renal abscess/sinus tract  -This was found in an admission back in May 16, 2018 when he first presented with rectal pain and found to have a perirectal abscess.  He then was also found to have right renal subcapsular abscess secondary to ESBL with subsequent drain placement and placed on prolonged IV ertapenem by ID.  Drain has been removed since May.  He has been followed closely by infectious disease and urology with serial CT abdominal scans. 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 He had a stable 4 cm right renal sinus abscess and the previously  seen small subscapular right renal abscess had nearly completely resolved since the prior exam  -He received his last dose of IV ertapenem today and has plans to transition to omadacycline for 2 weeks starting tomorrow   Remote hx of PE/DVT following prostatectomy in 2006 s/p IVC  s/p bioprosthetic TAVR Persistent atrial fibrillation -CHA2DSVASC score 5 -Supratherapeutic INR 3.3 today -Coumadin per pharmacy.  INR goal 2-3  History of  heart block s/p pacemaker Stable  Chronic kidney disease stage IIIa -Cr stable  Coronary artery disease status post stent -on Coumadin -continue Imdur  Hypertension -continue amlodipine  OSA Continue CPAP  Dermatitis of the scotal/buttock region from profuse diarrhea Daily topical steroid and zinc oxide Place rectal tube to keep area clean    DVT prophylaxis:.Coumadin  Code Status: Full Family Communication: Plan discussed with patient and wife at bedside  disposition Plan: Home with observation Consults called:  Admission status: Observation  Level of care: Med-Surg  Status is: Observation  The patient remains OBS appropriate and will d/c before 2 midnights.  Dispo: The patient is from: Home              Anticipated d/c is to: Home              Patient currently is not medically stable to d/c.   Difficult to place patient No         Orene Desanctis DO [facility]   If 7PM-7AM, please contact night-coverage [email or web address]   08/08/2020, 8:04 PM

## 2020-08-08 NOTE — ED Triage Notes (Signed)
Per EMS pt from home,has C-Diff. Takes vanc per family and wants to be evaluated. Pace maker, Pic line in left arm.  134/72 22 left forearm 98 RA Cbg 141 64 paced    350 N/S 4mg  Zofran

## 2020-08-08 NOTE — Discharge Instructions (Addendum)
If you develop bloody stools, fever, abdominal pain, vomiting, inability to tolerate oral fluids, or any other new/concerning symptoms contact your primary care provider for help.  Make sure you arrange to have your INR checked in 2 days via your usual coumadin clinic.

## 2020-08-08 NOTE — ED Notes (Signed)
Call to 5E to give report as no purple man has been assigned

## 2020-08-08 NOTE — ED Notes (Signed)
Pt waiting for IV to finish per MD, and medication for rash on genitals

## 2020-08-08 NOTE — ED Notes (Signed)
Pt awaiting d/c due to severe diarrhea and skin breakdown. Provider aware and trying to see about getting pt a over night stay.

## 2020-08-08 NOTE — Telephone Encounter (Signed)
Viewed Dr Alcario Drought note from 6-30. Pt with renal abscess and diarrhea.  Called today worsening diarrhea. Watery. Also now having emesis. He has been very weak. No fevers. 97.2.  He started po vanco on 7-1.   I advised him to go to [facility] ED but they state that he had a bad experience there.  I let them know that [facility] is (field triage) very busy and their chances of getting a bed are much better at [facility].  They are coming via EMS to [facility].

## 2020-08-08 NOTE — ED Notes (Signed)
Pt cleaned up again as he had further BM and stool is leaking around flexi seal

## 2020-08-08 NOTE — ED Notes (Signed)
Report given to 5E 

## 2020-08-08 NOTE — ED Notes (Signed)
Cleaned pt as he had large amount of stool.  Cleaned with toomey with warm water, patted dry and applied barrier cream, entire area raw.  Placed flexi seal and placed in depend. Pt has urine dripping constantly, MD notified

## 2020-08-08 NOTE — ED Notes (Signed)
Pt is red and raw in depend area, cleaned pt and applied barrier cream and depend

## 2020-08-09 DIAGNOSIS — Z86718 Personal history of other venous thrombosis and embolism: Secondary | ICD-10-CM | POA: Diagnosis not present

## 2020-08-09 DIAGNOSIS — Z6833 Body mass index (BMI) 33.0-33.9, adult: Secondary | ICD-10-CM | POA: Diagnosis not present

## 2020-08-09 DIAGNOSIS — D696 Thrombocytopenia, unspecified: Secondary | ICD-10-CM | POA: Diagnosis not present

## 2020-08-09 DIAGNOSIS — T82838A Hemorrhage of vascular prosthetic devices, implants and grafts, initial encounter: Secondary | ICD-10-CM | POA: Diagnosis not present

## 2020-08-09 DIAGNOSIS — F05 Delirium due to known physiological condition: Secondary | ICD-10-CM | POA: Diagnosis not present

## 2020-08-09 DIAGNOSIS — Z952 Presence of prosthetic heart valve: Secondary | ICD-10-CM | POA: Diagnosis not present

## 2020-08-09 DIAGNOSIS — N179 Acute kidney failure, unspecified: Secondary | ICD-10-CM | POA: Diagnosis not present

## 2020-08-09 DIAGNOSIS — I5042 Chronic combined systolic (congestive) and diastolic (congestive) heart failure: Secondary | ICD-10-CM | POA: Diagnosis present

## 2020-08-09 DIAGNOSIS — T82868A Thrombosis of vascular prosthetic devices, implants and grafts, initial encounter: Secondary | ICD-10-CM | POA: Diagnosis not present

## 2020-08-09 DIAGNOSIS — B372 Candidiasis of skin and nail: Secondary | ICD-10-CM | POA: Diagnosis not present

## 2020-08-09 DIAGNOSIS — G4733 Obstructive sleep apnea (adult) (pediatric): Secondary | ICD-10-CM | POA: Diagnosis not present

## 2020-08-09 DIAGNOSIS — I441 Atrioventricular block, second degree: Secondary | ICD-10-CM | POA: Diagnosis present

## 2020-08-09 DIAGNOSIS — I2510 Atherosclerotic heart disease of native coronary artery without angina pectoris: Secondary | ICD-10-CM | POA: Diagnosis not present

## 2020-08-09 DIAGNOSIS — Y838 Other surgical procedures as the cause of abnormal reaction of the patient, or of later complication, without mention of misadventure at the time of the procedure: Secondary | ICD-10-CM | POA: Diagnosis present

## 2020-08-09 DIAGNOSIS — Z7189 Other specified counseling: Secondary | ICD-10-CM | POA: Diagnosis not present

## 2020-08-09 DIAGNOSIS — Z452 Encounter for adjustment and management of vascular access device: Secondary | ICD-10-CM | POA: Diagnosis not present

## 2020-08-09 DIAGNOSIS — M7989 Other specified soft tissue disorders: Secondary | ICD-10-CM | POA: Diagnosis not present

## 2020-08-09 DIAGNOSIS — Z1612 Extended spectrum beta lactamase (ESBL) resistance: Secondary | ICD-10-CM | POA: Diagnosis not present

## 2020-08-09 DIAGNOSIS — E669 Obesity, unspecified: Secondary | ICD-10-CM | POA: Diagnosis present

## 2020-08-09 DIAGNOSIS — Z7901 Long term (current) use of anticoagulants: Secondary | ICD-10-CM | POA: Diagnosis not present

## 2020-08-09 DIAGNOSIS — R197 Diarrhea, unspecified: Secondary | ICD-10-CM | POA: Diagnosis not present

## 2020-08-09 DIAGNOSIS — K219 Gastro-esophageal reflux disease without esophagitis: Secondary | ICD-10-CM | POA: Diagnosis present

## 2020-08-09 DIAGNOSIS — N151 Renal and perinephric abscess: Secondary | ICD-10-CM

## 2020-08-09 DIAGNOSIS — Z1624 Resistance to multiple antibiotics: Secondary | ICD-10-CM | POA: Diagnosis present

## 2020-08-09 DIAGNOSIS — I4819 Other persistent atrial fibrillation: Secondary | ICD-10-CM | POA: Diagnosis not present

## 2020-08-09 DIAGNOSIS — A09 Infectious gastroenteritis and colitis, unspecified: Secondary | ICD-10-CM | POA: Diagnosis not present

## 2020-08-09 DIAGNOSIS — E782 Mixed hyperlipidemia: Secondary | ICD-10-CM | POA: Diagnosis present

## 2020-08-09 DIAGNOSIS — N1831 Chronic kidney disease, stage 3a: Secondary | ICD-10-CM | POA: Diagnosis not present

## 2020-08-09 DIAGNOSIS — Z20822 Contact with and (suspected) exposure to covid-19: Secondary | ICD-10-CM | POA: Diagnosis present

## 2020-08-09 DIAGNOSIS — Z66 Do not resuscitate: Secondary | ICD-10-CM | POA: Diagnosis present

## 2020-08-09 DIAGNOSIS — Z515 Encounter for palliative care: Secondary | ICD-10-CM | POA: Diagnosis not present

## 2020-08-09 DIAGNOSIS — A499 Bacterial infection, unspecified: Secondary | ICD-10-CM | POA: Diagnosis not present

## 2020-08-09 DIAGNOSIS — Z950 Presence of cardiac pacemaker: Secondary | ICD-10-CM | POA: Diagnosis not present

## 2020-08-09 DIAGNOSIS — Z95828 Presence of other vascular implants and grafts: Secondary | ICD-10-CM | POA: Diagnosis not present

## 2020-08-09 DIAGNOSIS — A044 Other intestinal Escherichia coli infections: Secondary | ICD-10-CM | POA: Diagnosis present

## 2020-08-09 DIAGNOSIS — J4540 Moderate persistent asthma, uncomplicated: Secondary | ICD-10-CM | POA: Diagnosis present

## 2020-08-09 DIAGNOSIS — I130 Hypertensive heart and chronic kidney disease with heart failure and stage 1 through stage 4 chronic kidney disease, or unspecified chronic kidney disease: Secondary | ICD-10-CM | POA: Diagnosis present

## 2020-08-09 LAB — CBC
HCT: 42.7 % (ref 39.0–52.0)
Hemoglobin: 13.4 g/dL (ref 13.0–17.0)
MCH: 25.9 pg — ABNORMAL LOW (ref 26.0–34.0)
MCHC: 31.4 g/dL (ref 30.0–36.0)
MCV: 82.6 fL (ref 80.0–100.0)
Platelets: 201 10*3/uL (ref 150–400)
RBC: 5.17 MIL/uL (ref 4.22–5.81)
RDW: 17.1 % — ABNORMAL HIGH (ref 11.5–15.5)
WBC: 10.6 10*3/uL — ABNORMAL HIGH (ref 4.0–10.5)
nRBC: 0 % (ref 0.0–0.2)

## 2020-08-09 LAB — BASIC METABOLIC PANEL
Anion gap: 7 (ref 5–15)
BUN: 19 mg/dL (ref 8–23)
CO2: 22 mmol/L (ref 22–32)
Calcium: 9.4 mg/dL (ref 8.9–10.3)
Chloride: 109 mmol/L (ref 98–111)
Creatinine, Ser: 1.41 mg/dL — ABNORMAL HIGH (ref 0.61–1.24)
GFR, Estimated: 48 mL/min — ABNORMAL LOW (ref >60)
Glucose, Bld: 111 mg/dL — ABNORMAL HIGH (ref 70–99)
Potassium: 4.1 mmol/L (ref 3.5–5.1)
Sodium: 138 mmol/L (ref 135–145)

## 2020-08-09 LAB — PROTIME-INR
INR: 3.6 — ABNORMAL HIGH (ref 0.8–1.2)
Prothrombin Time: 36.1 seconds — ABNORMAL HIGH (ref 11.4–15.2)

## 2020-08-09 LAB — GASTROINTESTINAL PANEL BY PCR, STOOL (REPLACES STOOL CULTURE)

## 2020-08-09 LAB — SARS CORONAVIRUS 2 (TAT 6-24 HRS): SARS Coronavirus 2: NEGATIVE

## 2020-08-09 MED ORDER — AZITHROMYCIN 250 MG PO TABS
500.0000 mg | ORAL_TABLET | Freq: Every day | ORAL | Status: AC
  Administered 2020-08-09 – 2020-08-11 (×3): 500 mg via ORAL
  Filled 2020-08-09 (×3): qty 2

## 2020-08-09 NOTE — Progress Notes (Signed)
Patient ID: [name], male   DOB: [birth year], 87 y.o.   MRN: [id number]  PROGRESS NOTE    JOHNATTAN STRASSMAN  GYJ:[id number] DOB: April 05, 1931 DOA: 08/08/2020 PCP: Shirline Frees, MD   Brief Narrative:  87 y.o. male with medical history significant for CAD, biventricular cardiac pacemaker, s/p TAVR, persistent atrial fibrillation on Coumadin, combined systolic and diastolic HF, HLD, HTN, OSA on CPAP, hx of DVT/PE s/p IVC, hx of prostate cancer s/p radical prostatectomy, recent right renal subcapsular abscess secondary to ESBL in the setting of nonobstructing nephrolithiasis (followed by urology and ID as an outpatient) treated with 12 weeks of IV ertapenem which got completed on 08/08/2020, recently started on oral vancomycin from 08/06/2020 by ID for positive C. difficile diarrhea presented with worsening diarrhea along with vomiting.  On presentation, he was afebrile and normotensive with no leukocytosis.  C. difficile panel was negative.  Assessment & Plan:   Persistent diarrhea along with nausea and vomiting -Patient had a prolonged course of IV ertapenem for 2 weeks which got completed on 08/08/2020. -Empirically started on oral vancomycin on 08/06/2020 by ID for presumptive C. difficile diarrhea -Presented with worsening diarrhea.  C. difficile panel was negative.  CT of the abdomen and pelvis showed findings consistent with diarrhea -Stool for C. difficile is pending -Still has significant diarrhea requiring rectal tube.  Continue Lomotil.  Continue probiotics.  Might have to add cholestyramine if patient continues to have lots of diarrhea. -Decrease IV fluids to 50 cc a day.  Currently not vomiting.  Try and advance diet to soft diet  History of right renal abscess/sinus tract --This was found in an admission back in May 16, 2018 when he first presented with rectal pain and found to have a perirectal abscess.  He then was also found to have right renal subcapsular abscess secondary to ESBL  with subsequent drain placement and placed on prolonged IV ertapenem by ID.  Drain has been removed since May.  He has been followed closely by infectious disease and urology with serial CT abdominal scans. 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 He had a stable 4 cm right renal sinus abscess and the previously seen small subscapular right renal abscess had nearly completely resolved since the prior exam -Completed 12 weeks of IV ertapenem on 08/08/2020 with plans for oral omadacycline for 2 weeks starting from today.  Consulted Dr. Johnnye Sima as per wife's request. -CT of the abdomen on presentation showed complex cystic structure in the right renal hilum, similar to prior study: Possible abscesses or complex cysts -Outpatient follow-up with urology.  Remote history of PE/DVT  Persistent atrial fibrillation History of bioprosthetic TAVR Supratherapeutic INR -INR 3.6 today.  Pharmacy consulted to dose Coumadin.  History of heart block status post pacemaker -Stable.  Outpatient follow-up with cardiology  Chronic kidney disease stage IIIa -monitor.  Creatinine stable currently.  Coronary disease status post stent -Stable continue Imdur and statin.  Outpatient follow-up with cardiology  Hypertension -Continue amlodipine and Imdur  Hyperlipidemia -Continue statin  OSA -Continue CPAP at bedtime  Dermatitis of the buttock region from diffuse diarrhea -Continue topical zinc oxide  Generalized deconditioning -We will need PT eval   DVT prophylaxis: Coumadin Code Status: Full Family Communication: Wife at bedside Disposition Plan: Status is: Inpatient  Remains inpatient appropriate because:Inpatient level of care appropriate due to severity of illness  Dispo: The patient is from: Home  Anticipated d/c is to: Home               Patient currently is not medically stable to d/c.   Difficult to place patient No  Consultants: Consulted ID  Procedures: None  Antimicrobials: Omadacycline  Subjective: Patient seen and examined at bedside.  Hard of hearing, poor historian.  Still having a lot of diarrhea.  Wife present at bedside.  No overnight fever, vomiting, worsening abdominal pain.  Denies worsening shortness of breath  Objective: Vitals:   08/08/20 2211 08/09/20 0210 08/09/20 0610 08/09/20 1002  BP: 135/65 126/67 116/63 118/70  Pulse: 63 62 63 (!) 59  Resp: 20   14  Temp: 98.3 F (36.8 C) (!) 97.3 F (36.3 C) 97.7 F (36.5 C) 97.7 F (36.5 C)  TempSrc: Oral Axillary Oral Oral  SpO2: 97% 98% 100% 99%  Weight:      Height:       No intake or output data in the 24 hours ending 08/09/20 1119 Filed Weights   08/08/20 1339  Weight: 93.4 kg    Examination:  General exam: Appears calm and comfortable.  Hard of hearing.  Currently on room air.  Elderly male lying in bed.  Looks chronically ill. Respiratory system: Bilateral decreased breath sounds at bases with some scattered crackles Cardiovascular system: S1 & S2 heard, mild intermittent bradycardia Gastrointestinal system: Abdomen is nondistended, soft and nontender. Normal bowel sounds heard. Extremities: No cyanosis, clubbing; trace lower extremity edema Central nervous system: Alert and oriented.  Extremely slow to respond, poor historian.  No focal neurological deficits. Moving extremities Skin: No obvious ecchymosis/lesions  psychiatry: Mostly flat affect.  Genitourinary: Rectal tube present    Data Reviewed: I have personally reviewed following labs and imaging studies  CBC: Recent Labs  Lab 08/08/20 1443 08/09/20 0523  WBC 10.2 10.6*  NEUTROABS 6.8  --   HGB 14.8 13.4  HCT 47.9 42.7  MCV 83.3 82.6  PLT 219 193   Basic Metabolic Panel: Recent Labs  Lab 08/08/20 1443 08/09/20 0523  NA 140 138  K 4.1 4.1  CL 110 109  CO2 20*  22  GLUCOSE 132* 111*  BUN 20 19  CREATININE 1.25* 1.41*  CALCIUM 9.5 9.4   GFR: Estimated Creatinine Clearance: 40.9 mL/min (A) (by C-G formula based on SCr of 1.41 mg/dL (H)). Liver Function Tests: Recent Labs  Lab 08/08/20 1443  AST 17  ALT 6  ALKPHOS 89  BILITOT 0.5  PROT 7.3  ALBUMIN 3.3*   No results for input(s): LIPASE, AMYLASE in the last 168 hours. No results for input(s): AMMONIA in the last 168 hours. Coagulation Profile: Recent Labs  Lab 08/04/20 0000 08/08/20 1443 08/09/20 0523  INR 2.5 3.3* 3.6*   Cardiac Enzymes: No results for input(s): CKTOTAL, CKMB, CKMBINDEX, TROPONINI in the last 168 hours. BNP (last 3 results) No results for input(s): PROBNP in the last 8760 hours. HbA1C: No results for input(s): HGBA1C in the last 72 hours. CBG: No results for input(s): GLUCAP in the last 168 hours. Lipid Profile: No results for input(s): CHOL, HDL, LDLCALC, TRIG, CHOLHDL, LDLDIRECT in the last 72 hours. Thyroid Function Tests: No results for input(s): TSH, T4TOTAL, FREET4, T3FREE, THYROIDAB in the last 72 hours. Anemia Panel: No results for input(s): VITAMINB12, FOLATE, FERRITIN, TIBC, IRON, RETICCTPCT in the last 72 hours. Sepsis Labs: No results for input(s): PROCALCITON, LATICACIDVEN in the last 168 hours.  Recent Results (from the past 240 hour(s))  C Difficile Quick Screen  w PCR reflex     Status: None   Collection Time: 08/08/20  4:30 PM   Specimen: STOOL  Result Value Ref Range Status   C Diff antigen NEGATIVE NEGATIVE Final   C Diff toxin NEGATIVE NEGATIVE Final   C Diff interpretation No C. difficile detected.  Final    Comment: Performed at [facility], [place] [address] [place], [place] [address]         Radiology Studies: CT ABDOMEN PELVIS W CONTRAST  Result Date: 08/08/2020 CLINICAL DATA:  Acute abdominal pain. Diarrhea. Abdominal guarding. Patient takes vancomycin. EXAM: CT ABDOMEN AND PELVIS WITH CONTRAST  TECHNIQUE: Multidetector CT imaging of the abdomen and pelvis was performed using the standard protocol following bolus administration of intravenous contrast. CONTRAST:  140mL OMNIPAQUE IOHEXOL 300 MG/ML  SOLN COMPARISON:  07/26/2020 FINDINGS: Lower chest: Peripheral fibrosis in the lung bases. Cardiac enlargement. Postoperative changes in the heart. Hepatobiliary: Cholelithiasis. No evidence of cholecystitis. No bile duct dilatation. No focal liver lesions. Pancreas: Unremarkable. No pancreatic ductal dilatation or surrounding inflammatory changes. Spleen: Normal in size without focal abnormality. Adrenals/Urinary Tract: No adrenal gland nodules. Diffuse renal parenchymal atrophy bilaterally. Focal lesions demonstrated in the right renal hilum. These are low-attenuation with peripheral enhancement or wall thickening and some intraluminal fat. Largest lesion measures about 3.9 cm in diameter. This appearance is similar to prior study and may represent renal abscess or complex cyst. The left kidney demonstrates small parapelvic cysts in small parenchymal cysts. No hydronephrosis or hydroureter. Bladder is unremarkable. Stomach/Bowel: Stomach, small bowel, and colon are not abnormally distended. Diffusely fluid-filled colon consistent with history of diarrhea. No significant colonic or small bowel wall thickening. No pericolonic inflammatory changes. Vascular/Lymphatic: Aortic atherosclerosis. No enlarged abdominal or pelvic lymph nodes. Inferior vena caval filter is in place. Reproductive: Prostate gland is surgically absent. Other: No free air or free fluid in the abdomen. Abdominal wall musculature appears intact. Musculoskeletal: Scoliosis of the lumbar spine convex towards the left. Prominent diffuse degenerative changes. No destructive bone lesions. IMPRESSION: 1. Diffusely fluid-filled colon without distention or significant wall thickening consistent with history of diarrhea. 2. Cholelithiasis without  evidence of cholecystitis. 3. Bilateral renal atrophy. Complex cystic structure is demonstrated in the right renal hilum, similar to prior study. Possible abscesses or complex cysts. 4. Aortic atherosclerosis. Electronically Signed   By: Lucienne Capers M.D.   On: 08/08/2020 20:56        Scheduled Meds:  acetaminophen  500 mg Oral QHS   amLODipine  2.5 mg Oral QHS   vitamin C  1,000 mg Oral Daily   cholecalciferol  1,000 Units Oral Daily   clotrimazole   Topical Daily   diphenoxylate-atropine  1 tablet Oral QID   isosorbide mononitrate  15 mg Oral QHS   lactobacillus  1 g Oral TID WC   Omadacycline Tosylate  300 mg Oral Daily   omega-3 acid ethyl esters  1 g Oral Daily   pantoprazole  40 mg Oral Daily   pravastatin  20 mg Oral QHS   vitamin B-12  500 mcg Oral Daily   Warfarin - Pharmacist Dosing Inpatient   Does not apply q1600   Zinc Oxide   Topical Daily   Continuous Infusions:  lactated ringers 75 mL/hr at 08/09/20 8182          Aline August, MD [facility] 08/09/2020, 11:19 AM

## 2020-08-09 NOTE — Consult Note (Addendum)
[name]ious Disease    Date of Admission:  08/08/2020   Total days of antibiotics: 0               Reason for Consult: Diarrhea    Referring Provider: Starla Link   Assessment: E coli diarrhea Renal abscess with ESBL E coli  Plan: Azithromycin 500mg  daily po x 3 Hydration, follow electrolytes Anti-motility agents Start omadacycline when azithro complete so as not to overwhelm his GI tract.  If diarrhea persists after azithro, consider GI eval  Comment EPEAC typically is a self limited illness of childhood. I would consider giving him antibiotics for this in light of the severity (he requires a rectal tube).  Dr Montel Culver to f/u in AM.   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)   Long term (current) use of anticoagulants   CAD (coronary artery disease), native coronary artery   Persistent atrial fibrillation (HCC)   Cardiac pacemaker in situ   CKD (chronic kidney disease), stage III (HCC)   S/P TAVR (transcatheter aortic valve replacement)   OSA on CPAP   Presence of IVC filter   Renal abscess    acetaminophen  500 mg Oral QHS   amLODipine  2.5 mg Oral QHS   vitamin C  1,000 mg Oral Daily   cholecalciferol  1,000 Units Oral Daily   clotrimazole   Topical Daily   diphenoxylate-atropine  1 tablet Oral QID   isosorbide mononitrate  15 mg Oral QHS   lactobacillus  1 g Oral TID WC   Omadacycline Tosylate  300 mg Oral Daily   omega-3 acid ethyl esters  1 g Oral Daily   pantoprazole  40 mg Oral Daily   pravastatin  20 mg Oral QHS   vitamin B-12  500 mcg Oral Daily   Warfarin - Pharmacist Dosing Inpatient   Does not apply q1600   Zinc Oxide   Topical Daily    HPI: [name] is a 87 y.o. male with hx of R sided pyelo, abscess, and renal calculi. His Cx grew ESBL E coli.  He was treated with invanz and had  f/u with Dr Juleen China on 6-30 (was at 12 weeks of anbx).  He was planned to complete his anbx on 7-1 however his course was complicated by  BRBPR and diarrhea. He was contacted by phone on 7-1 and was started on po vanco for concern he could have C diff.   He called me yesterday with worsening diarrhea and emesis. I advised him to come to ED as it seemed this was becoming severe.  In ED he was normotensive and afebrile. He was hydrated and given lomotil.  His GI pathogen panel shows Enteropathogenic E coli.   Review of Systems: Review of Systems  Constitutional:  Negative for chills and fever.  Respiratory:  Negative for cough and shortness of breath.   Gastrointestinal:  Positive for blood in stool, diarrhea, nausea and vomiting. Negative for constipation.  Genitourinary:  Negative for dysuria and flank pain.   Past Medical History:  Diagnosis Date   Aortic valve stenosis 04/05/2015   Formatting of this note might be different from the original. Overview:  ECHO 5/15 Formatting of this note might be different from the original. Overview:  Overview:  ECHO 5/15   Arthritis    "knees,  right shoulder" (03/14/2017)   Atrial fibrillation, persistent ([place]) 09/19/2014   CAD (coronary artery disease), native coronary artery    Cath 2011 LHC (08/2009):~ Proximal LAD 30%, mid to distal LAD 25%, ostial small D1 75% mid AV groove circumflex 99% been subtotal stenosis, proximal to mid RCA 25-30%, mid RCA 30%, mid PDA 30%, EF 50% with inferior hypokinesis.;    July 2011  PCI and DES to circumflex Dr. Olevia Perches   ETT-Myoview (06/2013):  Inferolateral scar, mild peri-infarct ischemia, EF 40%; Intermediate Risk   ECHO EF 55% 03/2013    CAD (coronary artery disease), native coronary artery 04/05/2015   Cath 2011 LHC (08/2009):~ Proximal LAD 30%, mid to distal LAD 25%, ostial small D1 75% mid AV groove circumflex 99% been subtotal stenosis, proximal to mid RCA 25-30%, mid RCA 30%, mid PDA 30%, EF 50% with inferior  hypokinesis.;    July 2011  PCI and DES to circumflex Dr. Olevia Perches   ETT-Myoview (06/2013):  Inferolateral scar, mild peri-infarct ischemia, EF 40%; Intermediate Risk   ECHO EF 55% 03/2013     Cardiac pacemaker in situ 03/14/2017   Biventricular St. Jude inserted 03/14/17 Dr. 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   History of prostate cancer    Radical prostatectomy in 2006 Dr. Terance Hart    Hypertensive heart disease    Internal hemorrhoids 09/11/2018   Long term (current) use of anticoagulants 10/06/2011   Mixed hyperlipidemia    Moderate persistent asthma without complication 1/88/4166   Noise effect on both inner ears 08/14/2018   Obesity (BMI 30-39.9)    OSA on CPAP    OSA treated with BiPAP 11/15/2015   Persistent atrial fibrillation ([place]) 09/19/2014   CHA2DS2VASC score 5  Cardioversion 10/08/2014 was on amiodarone until 2018    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)    Initial DVT in 2006 after prostate surgery and had [place] filter placed Bilateral  PE 2013 and placed back on warfarin DVT of right subclavian vein on doppler 10/2012 at time of knee infection    Personal history of malignant neoplasm of prostate    Radical prostatectomy in 2006 Dr. Terance Hart     Presbycusis of both ears 08/14/2018   Severe aortic stenosis    Severe aortic stenosis 03/18/2018   Urinary incontinence    MULTIPLE BLADDER SURGERIES - STATES NO URINARY SPHINCTER - PT'S UROLOGIST IS AT [facility]- DR. PETERSON  ( LAST VISIT WAS 09/15/11 )    Social History   Tobacco Use   Smoking status: Former     Packs/day: 1.00    Years: 10.00    Pack years: 10.00    Types: Cigarettes    Quit date: 04/27/1961    Years since quitting: 59.3   Smokeless tobacco: Never  Vaping Use   Vaping Use: Never used  Substance Use Topics   Alcohol use: No    Alcohol/week: 0.0 standard drinks   Drug use: No    Family History  Problem Relation Age of Onset   Melanoma Father    Melanoma Brother  Medications: Scheduled:  acetaminophen  500 mg Oral QHS   amLODipine  2.5 mg Oral QHS   vitamin C  1,000 mg Oral Daily   cholecalciferol  1,000 Units Oral Daily   clotrimazole   Topical Daily   diphenoxylate-atropine  1 tablet Oral QID   isosorbide mononitrate  15 mg Oral QHS   lactobacillus  1 g Oral TID WC   Omadacycline Tosylate  300 mg Oral Daily   omega-3 acid ethyl esters  1 g Oral Daily   pantoprazole  40 mg Oral Daily   pravastatin  20 mg Oral QHS   vitamin B-12  500 mcg Oral Daily   Warfarin - Pharmacist Dosing Inpatient   Does not apply q1600   Zinc Oxide   Topical Daily    Abtx:  Anti-infectives (From admission, onward)    Start     Dose/Rate Route Frequency Ordered Stop   08/09/20 1000  Omadacycline Tosylate TABS 300 mg        300 mg Oral Daily 08/08/20 2206           OBJECTIVE: Blood pressure 119/62, pulse (!) 59, temperature 98.1 F (36.7 C), temperature source Oral, resp. rate 15, height 5\' 8"  (1.727 m), weight 93.4 kg, SpO2 100 %.  Physical Exam Vitals reviewed.  Constitutional:      General: He is not in acute distress.    Appearance: Normal appearance. He is not ill-appearing, toxic-appearing or diaphoretic.  HENT:     Mouth/Throat:     Mouth: Mucous membranes are moist.     Pharynx: No oropharyngeal exudate.  Eyes:     Extraocular Movements: Extraocular movements intact.     Pupils: Pupils are equal, round, and reactive to light.  Cardiovascular:     Rate and Rhythm: Normal rate and regular rhythm.  Pulmonary:     Effort: Pulmonary effort is normal.      Breath sounds: Normal breath sounds.  Abdominal:     General: Abdomen is flat. Bowel sounds are normal. There is no distension.     Palpations: Abdomen is soft.     Tenderness: There is no abdominal tenderness.  Musculoskeletal:     Cervical back: Normal range of motion and neck supple.     Right lower leg: No edema.     Left lower leg: No edema.  Neurological:     General: No focal deficit present.     Mental Status: He is alert.    Lab Results Results for orders placed or performed during the hospital encounter of 08/08/20 (from the past 48 hour(s))  Comprehensive metabolic panel     Status: Abnormal   Collection Time: 08/08/20  2:43 PM  Result Value Ref Range   Sodium 140 135 - 145 mmol/L   Potassium 4.1 3.5 - 5.1 mmol/L   Chloride 110 98 - 111 mmol/L   CO2 20 (L) 22 - 32 mmol/L   Glucose, Bld 132 (H) 70 - 99 mg/dL    Comment: Glucose reference range applies only to samples taken after fasting for at least 8 hours.   BUN 20 8 - 23 mg/dL   Creatinine, Ser 1.25 (H) 0.61 - 1.24 mg/dL   Calcium 9.5 8.9 - 10.3 mg/dL   Total Protein 7.3 6.5 - 8.1 g/dL   Albumin 3.3 (L) 3.5 - 5.0 g/dL   AST 17 15 - 41 U/L   ALT 6 0 - 44 U/L   Alkaline Phosphatase 89 38 - 126 U/L   Total  Bilirubin 0.5 0.3 - 1.2 mg/dL   GFR, Estimated 56 (L) >60 mL/min    Comment: (NOTE) Calculated using the CKD-EPI Creatinine Equation (2021)    Anion gap 10 5 - 15    Comment: Performed at [facility], [place] [address] [place], [place] [address]  CBC with Differential     Status: Abnormal   Collection Time: 08/08/20  2:43 PM  Result Value Ref Range   WBC 10.2 4.0 - 10.5 K/uL   RBC 5.75 4.22 - 5.81 MIL/uL   Hemoglobin 14.8 13.0 - 17.0 g/dL   HCT 47.9 39.0 - 52.0 %   MCV 83.3 80.0 - 100.0 fL   MCH 25.7 (L) 26.0 - 34.0 pg   MCHC 30.9 30.0 - 36.0 g/dL   RDW 17.8 (H) 11.5 - 15.5 %   Platelets 219 150 - 400 K/uL   nRBC 0.0 0.0 - 0.2 %   Neutrophils Relative % 68 %   Neutro Abs 6.8 1.7 -  7.7 K/uL   Lymphocytes Relative 11 %   Lymphs Abs 1.2 0.7 - 4.0 K/uL   Monocytes Relative 8 %   Monocytes Absolute 0.8 0.1 - 1.0 K/uL   Eosinophils Relative 13 %   Eosinophils Absolute 1.4 (H) 0.0 - 0.5 K/uL   Basophils Relative 0 %   Basophils Absolute 0.0 0.0 - 0.1 K/uL   Immature Granulocytes 0 %   Abs Immature Granulocytes 0.04 0.00 - 0.07 K/uL    Comment: Performed at [facility], [place] [address], [place], [place] [address]  Protime-INR     Status: Abnormal   Collection Time: 08/08/20  2:43 PM  Result Value Ref Range   Prothrombin Time 33.5 (H) 11.4 - 15.2 seconds   INR 3.3 (H) 0.8 - 1.2    Comment: (NOTE) INR goal varies based on device and disease states. Performed at [facility], [place] [address] [place], [place] [address]   C Difficile Quick Screen w PCR reflex     Status: None   Collection Time: 08/08/20  4:30 PM   Specimen: STOOL  Result Value Ref Range   C Diff antigen NEGATIVE NEGATIVE   C Diff toxin NEGATIVE NEGATIVE   C Diff interpretation No C. difficile detected.     Comment: Performed at [facility], [place] [address], [place], [place] [address]  Gastrointestinal Panel by PCR , Stool     Status: Abnormal   Collection Time: 08/08/20  4:30 PM   Specimen: STOOL  Result Value Ref Range   Campylobacter species NOT DETECTED NOT DETECTED   Plesimonas shigelloides NOT DETECTED NOT DETECTED   Salmonella species NOT DETECTED NOT DETECTED   Yersinia enterocolitica NOT DETECTED NOT DETECTED   Vibrio species NOT DETECTED NOT DETECTED   Vibrio cholerae NOT DETECTED NOT DETECTED   Enteroaggregative E coli (EAEC) NOT DETECTED NOT DETECTED   Enteropathogenic E coli (EPEC) DETECTED (A) NOT DETECTED    Comment: CRITICAL RESULT CALLED TO, READ BACK BY AND VERIFIED WITH: MELISSA HOUSE RN @1408  08/09/20 SCS    Enterotoxigenic E coli (ETEC) NOT DETECTED NOT DETECTED   Shiga like toxin producing E coli (STEC) NOT DETECTED  NOT DETECTED   Shigella/Enteroinvasive E coli (EIEC) NOT DETECTED NOT DETECTED   Cryptosporidium NOT DETECTED NOT DETECTED   Cyclospora cayetanensis NOT DETECTED NOT DETECTED   Entamoeba histolytica NOT DETECTED NOT DETECTED   Giardia lamblia NOT DETECTED NOT DETECTED   Adenovirus F40/41 NOT DETECTED NOT DETECTED  Astrovirus NOT DETECTED NOT DETECTED   Norovirus GI/GII NOT DETECTED NOT DETECTED   Rotavirus A NOT DETECTED NOT DETECTED   Sapovirus (I, II, IV, and V) NOT DETECTED NOT DETECTED    Comment: Performed at [facility], [place] [place], [place] [address]  SARS CORONAVIRUS 2 (TAT 6-24 HRS) Nasopharyngeal Nasopharyngeal Swab     Status: None   Collection Time: 08/08/20  8:38 PM   Specimen: Nasopharyngeal Swab  Result Value Ref Range   SARS Coronavirus 2 NEGATIVE NEGATIVE    Comment: (NOTE) SARS-CoV-2 target nucleic acids are NOT DETECTED.  The SARS-CoV-2 RNA is generally detectable in upper and lower respiratory specimens during the acute phase of infection. Negative results do not preclude SARS-CoV-2 infection, do not rule out co-infections with other pathogens, and should not be used as the sole basis for treatment or other patient management decisions. Negative results must be combined with clinical observations, patient history, and epidemiological information. The expected result is Negative.  Fact Sheet for Patients: SugarRoll.be  Fact Sheet for Healthcare Providers: https://www.woods-mathews.com/  This test is not yet approved or cleared by the [place] FDA and  has been authorized for detection and/or diagnosis of SARS-CoV-2 by FDA under an Emergency Use Authorization (EUA). This EUA will remain  in effect (meaning this test can be used) for the duration of the COVID-19 declaration under Se ction 564(b)(1) of the Act, 21 U.S.C. section 360bbb-3(b)(1), unless the authorization is terminated  or revoked sooner.  Performed at [facility], [place] [address], [place], [place] [address]   CBC     Status: Abnormal   Collection Time: 08/09/20  5:23 AM  Result Value Ref Range   WBC 10.6 (H) 4.0 - 10.5 K/uL   RBC 5.17 4.22 - 5.81 MIL/uL   Hemoglobin 13.4 13.0 - 17.0 g/dL   HCT 42.7 39.0 - 52.0 %   MCV 82.6 80.0 - 100.0 fL   MCH 25.9 (L) 26.0 - 34.0 pg   MCHC 31.4 30.0 - 36.0 g/dL   RDW 17.1 (H) 11.5 - 15.5 %   Platelets 201 150 - 400 K/uL   nRBC 0.0 0.0 - 0.2 %    Comment: Performed at [facility], [place] [address], [place], [place] [address]  Basic metabolic panel     Status: Abnormal   Collection Time: 08/09/20  5:23 AM  Result Value Ref Range   Sodium 138 135 - 145 mmol/L   Potassium 4.1 3.5 - 5.1 mmol/L   Chloride 109 98 - 111 mmol/L   CO2 22 22 - 32 mmol/L   Glucose, Bld 111 (H) 70 - 99 mg/dL    Comment: Glucose reference range applies only to samples taken after fasting for at least 8 hours.   BUN 19 8 - 23 mg/dL   Creatinine, Ser 1.41 (H) 0.61 - 1.24 mg/dL   Calcium 9.4 8.9 - 10.3 mg/dL   GFR, Estimated 48 (L) >60 mL/min    Comment: (NOTE) Calculated using the CKD-EPI Creatinine Equation (2021)    Anion gap 7 5 - 15    Comment: Performed at [facility], [place] [address] [place], [place] [address]  Protime-INR     Status: Abnormal   Collection Time: 08/09/20  5:23 AM  Result Value Ref Range   Prothrombin Time 36.1 (H) 11.4 - 15.2 seconds   INR 3.6 (H) 0.8 - 1.2    Comment: (NOTE) INR goal varies based on device and disease  states. Performed at [facility], [place] [address] [place], [place] [address]       Component Value Date/Time   SDES BLOOD LEFT ANTECUBITAL 07/27/2020 1546   SPECREQUEST  07/27/2020 1546    BOTTLES DRAWN AEROBIC AND ANAEROBIC Blood Culture adequate volume   CULT  07/27/2020 1546    NO GROWTH 5 DAYS Performed at [facility], [place] [address], [place], [place]  [address]    REPTSTATUS 08/01/2020 FINAL 07/27/2020 1546   CT ABDOMEN PELVIS W CONTRAST  Result Date: 08/08/2020 CLINICAL DATA:  Acute abdominal pain. Diarrhea. Abdominal guarding. Patient takes vancomycin. EXAM: CT ABDOMEN AND PELVIS WITH CONTRAST TECHNIQUE: Multidetector CT imaging of the abdomen and pelvis was performed using the standard protocol following bolus administration of intravenous contrast. CONTRAST:  124mL OMNIPAQUE IOHEXOL 300 MG/ML  SOLN COMPARISON:  07/26/2020 FINDINGS: Lower chest: Peripheral fibrosis in the lung bases. Cardiac enlargement. Postoperative changes in the heart. Hepatobiliary: Cholelithiasis. No evidence of cholecystitis. No bile duct dilatation. No focal liver lesions. Pancreas: Unremarkable. No pancreatic ductal dilatation or surrounding inflammatory changes. Spleen: Normal in size without focal abnormality. Adrenals/Urinary Tract: No adrenal gland nodules. Diffuse renal parenchymal atrophy bilaterally. Focal lesions demonstrated in the right renal hilum. These are low-attenuation with peripheral enhancement or wall thickening and some intraluminal fat. Largest lesion measures about 3.9 cm in diameter. This appearance is similar to prior study and may represent renal abscess or complex cyst. The left kidney demonstrates small parapelvic cysts in small parenchymal cysts. No hydronephrosis or hydroureter. Bladder is unremarkable. Stomach/Bowel: Stomach, small bowel, and colon are not abnormally distended. Diffusely fluid-filled colon consistent with history of diarrhea. No significant colonic or small bowel wall thickening. No pericolonic inflammatory changes. Vascular/Lymphatic: Aortic atherosclerosis. No enlarged abdominal or pelvic lymph nodes. Inferior vena caval filter is in place. Reproductive: Prostate gland is surgically absent. Other: No free air or free fluid in the abdomen. Abdominal wall musculature appears intact. Musculoskeletal: Scoliosis of the lumbar spine convex  towards the left. Prominent diffuse degenerative changes. No destructive bone lesions. IMPRESSION: 1. Diffusely fluid-filled colon without distention or significant wall thickening consistent with history of diarrhea. 2. Cholelithiasis without evidence of cholecystitis. 3. Bilateral renal atrophy. Complex cystic structure is demonstrated in the right renal hilum, similar to prior study. Possible abscesses or complex cysts. 4. Aortic atherosclerosis. Electronically Signed   By: Lucienne Capers M.D.   On: 08/08/2020 20:56   Recent Results (from the past 240 hour(s))  C Difficile Quick Screen w PCR reflex     Status: None   Collection Time: 08/08/20  4:30 PM   Specimen: STOOL  Result Value Ref Range Status   C Diff antigen NEGATIVE NEGATIVE Final   C Diff toxin NEGATIVE NEGATIVE Final   C Diff interpretation No C. difficile detected.  Final    Comment: Performed at [facility], [place] [address] [place], [place] [address]  Gastrointestinal Panel by PCR , Stool     Status: Abnormal   Collection Time: 08/08/20  4:30 PM   Specimen: STOOL  Result Value Ref Range Status   Campylobacter species NOT DETECTED NOT DETECTED Final   Plesimonas shigelloides NOT DETECTED NOT DETECTED Final   Salmonella species NOT DETECTED NOT DETECTED Final   Yersinia enterocolitica NOT DETECTED NOT DETECTED Final   Vibrio species NOT DETECTED NOT DETECTED Final   Vibrio cholerae NOT DETECTED NOT DETECTED Final   Enteroaggregative E coli (EAEC) NOT DETECTED NOT DETECTED Final   Enteropathogenic  E coli (EPEC) DETECTED (A) NOT DETECTED Final    Comment: CRITICAL RESULT CALLED TO, READ BACK BY AND VERIFIED WITH: MELISSA HOUSE RN @1408  08/09/20 SCS    Enterotoxigenic E coli (ETEC) NOT DETECTED NOT DETECTED Final   Shiga like toxin producing E coli (STEC) NOT DETECTED NOT DETECTED Final   Shigella/Enteroinvasive E coli (EIEC) NOT DETECTED NOT DETECTED Final   Cryptosporidium NOT DETECTED NOT DETECTED Final    Cyclospora cayetanensis NOT DETECTED NOT DETECTED Final   Entamoeba histolytica NOT DETECTED NOT DETECTED Final   Giardia lamblia NOT DETECTED NOT DETECTED Final   Adenovirus F40/41 NOT DETECTED NOT DETECTED Final   Astrovirus NOT DETECTED NOT DETECTED Final   Norovirus GI/GII NOT DETECTED NOT DETECTED Final   Rotavirus A NOT DETECTED NOT DETECTED Final   Sapovirus (I, II, IV, and V) NOT DETECTED NOT DETECTED Final    Comment: Performed at [facility], [place] [place], [place] [address]  SARS CORONAVIRUS 2 (TAT 6-24 HRS) Nasopharyngeal Nasopharyngeal Swab     Status: None   Collection Time: 08/08/20  8:38 PM   Specimen: Nasopharyngeal Swab  Result Value Ref Range Status   SARS Coronavirus 2 NEGATIVE NEGATIVE Final    Comment: (NOTE) SARS-CoV-2 target nucleic acids are NOT DETECTED.  The SARS-CoV-2 RNA is generally detectable in upper and lower respiratory specimens during the acute phase of infection. Negative results do not preclude SARS-CoV-2 infection, do not rule out co-infections with other pathogens, and should not be used as the sole basis for treatment or other patient management decisions. Negative results must be combined with clinical observations, patient history, and epidemiological information. The expected result is Negative.  Fact Sheet for Patients: SugarRoll.be  Fact Sheet for Healthcare Providers: https://www.woods-mathews.com/  This test is not yet approved or cleared by the [place] FDA and  has been authorized for detection and/or diagnosis of SARS-CoV-2 by FDA under an Emergency Use Authorization (EUA). This EUA will remain  in effect (meaning this test can be used) for the duration of the COVID-19 declaration under Se ction 564(b)(1) of the Act, 21 U.S.C. section 360bbb-3(b)(1), unless the authorization is terminated or revoked sooner.  Performed at [facility], [place] [address], [place], [place] [address]     Microbiology: Recent Results (from the past 240 hour(s))  C Difficile Quick Screen w PCR reflex     Status: None   Collection Time: 08/08/20  4:30 PM   Specimen: STOOL  Result Value Ref Range Status   C Diff antigen NEGATIVE NEGATIVE Final   C Diff toxin NEGATIVE NEGATIVE Final   C Diff interpretation No C. difficile detected.  Final    Comment: Performed at [facility], [place] [address] [place], [place] [address]  Gastrointestinal Panel by PCR , Stool     Status: Abnormal   Collection Time: 08/08/20  4:30 PM   Specimen: STOOL  Result Value Ref Range Status   Campylobacter species NOT DETECTED NOT DETECTED Final   Plesimonas shigelloides NOT DETECTED NOT DETECTED Final   Salmonella species NOT DETECTED NOT DETECTED Final   Yersinia enterocolitica NOT DETECTED NOT DETECTED Final   Vibrio species NOT DETECTED NOT DETECTED Final   Vibrio cholerae NOT DETECTED NOT DETECTED Final   Enteroaggregative E coli (EAEC) NOT DETECTED NOT DETECTED Final   Enteropathogenic E coli (EPEC) DETECTED (A) NOT DETECTED Final    Comment: CRITICAL RESULT CALLED TO, READ BACK BY AND VERIFIED WITH: MELISSA HOUSE RN @  1408 08/09/20 SCS    Enterotoxigenic E coli (ETEC) NOT DETECTED NOT DETECTED Final   Shiga like toxin producing E coli (STEC) NOT DETECTED NOT DETECTED Final   Shigella/Enteroinvasive E coli (EIEC) NOT DETECTED NOT DETECTED Final   Cryptosporidium NOT DETECTED NOT DETECTED Final   Cyclospora cayetanensis NOT DETECTED NOT DETECTED Final   Entamoeba histolytica NOT DETECTED NOT DETECTED Final   Giardia lamblia NOT DETECTED NOT DETECTED Final   Adenovirus F40/41 NOT DETECTED NOT DETECTED Final   Astrovirus NOT DETECTED NOT DETECTED Final   Norovirus GI/GII NOT DETECTED NOT DETECTED Final   Rotavirus A NOT DETECTED NOT DETECTED Final   Sapovirus (I, II, IV, and V) NOT DETECTED NOT DETECTED Final    Comment: Performed at [facility],  [place] [place], [place] [address]  SARS CORONAVIRUS 2 (TAT 6-24 HRS) Nasopharyngeal Nasopharyngeal Swab     Status: None   Collection Time: 08/08/20  8:38 PM   Specimen: Nasopharyngeal Swab  Result Value Ref Range Status   SARS Coronavirus 2 NEGATIVE NEGATIVE Final    Comment: (NOTE) SARS-CoV-2 target nucleic acids are NOT DETECTED.  The SARS-CoV-2 RNA is generally detectable in upper and lower respiratory specimens during the acute phase of infection. Negative results do not preclude SARS-CoV-2 infection, do not rule out co-infections with other pathogens, and should not be used as the sole basis for treatment or other patient management decisions. Negative results must be combined with clinical observations, patient history, and epidemiological information. The expected result is Negative.  Fact Sheet for Patients: SugarRoll.be  Fact Sheet for Healthcare Providers: https://www.woods-mathews.com/  This test is not yet approved or cleared by the [place] FDA and  has been authorized for detection and/or diagnosis of SARS-CoV-2 by FDA under an Emergency Use Authorization (EUA). This EUA will remain  in effect (meaning this test can be used) for the duration of the COVID-19 declaration under Se ction 564(b)(1) of the Act, 21 U.S.C. section 360bbb-3(b)(1), unless the authorization is terminated or revoked sooner.  Performed at [facility], [place] [address], [place], [place] [address]     Radiographs and labs were personally reviewed by me.   Bobby Rumpf, MD [facility] [place] [facility] [phone] 08/09/2020, 4:51 PM

## 2020-08-09 NOTE — Progress Notes (Signed)
ANTICOAGULATION CONSULT NOTE - Follow Up Consult  Pharmacy Consult for warfarin Indication: hx atrial fibrillation, pulmonary embolus, and DVT  Allergies  Allergen Reactions   Darifenacin Nausea Only and Other (See Comments)    dry mouth and dizziness ENABLEX Dizziness Pt denies   Codeine Other (See Comments)   Sulfamethoxazole-Trimethoprim Other (See Comments)   Lisinopril Cough    Pt denies    Patient Measurements: Height: 5\' 8"  (172.7 cm) Weight: 93.4 kg (206 lb) IBW/kg (Calculated) : 68.4 Heparin Dosing Weight:   Vital Signs: Temp: 97.7 F (36.5 C) (07/04 1002) Temp Source: Oral (07/04 1002) BP: 118/70 (07/04 1002) Pulse Rate: 59 (07/04 1002)  Labs: Recent Labs    08/08/20 1443 08/09/20 0523  HGB 14.8 13.4  HCT 47.9 42.7  PLT 219 201  LABPROT 33.5* 36.1*  INR 3.3* 3.6*  CREATININE 1.25* 1.41*    Estimated Creatinine Clearance: 40.9 mL/min (A) (by C-G formula based on SCr of 1.41 mg/dL (H)).   Medications:  PTA warfarin regimen: 2.5mg  daily except 1.25mg  on Wed and Sat  Assessment: Patient is an 87 y.o M with hx DVT/PE (s/p IVC filter) and afib on warfarin PTA, presented to the ED on 7/3 with c/o diarrhea and vomiting.  Warfarin resumed on admission.  Today, 08/09/2020: - INR is supra-therapeutic at 3.6.   - Poor oral intake can make pt more sensitive to warfarin - cbc stable - scr up 1.41 (crcl~41)   Goal of Therapy:  INR 2-3 Monitor platelets by anticoagulation protocol: Yes   Plan:  - continue to hold warfarin today for INR >3 - daily INR - monitor for s/sx bleeding  Dia Sitter P 08/09/2020,11:22 AM

## 2020-08-10 ENCOUNTER — Telehealth: Payer: Self-pay

## 2020-08-10 DIAGNOSIS — N151 Renal and perinephric abscess: Secondary | ICD-10-CM | POA: Diagnosis not present

## 2020-08-10 DIAGNOSIS — A499 Bacterial infection, unspecified: Secondary | ICD-10-CM | POA: Diagnosis not present

## 2020-08-10 DIAGNOSIS — A09 Infectious gastroenteritis and colitis, unspecified: Secondary | ICD-10-CM | POA: Diagnosis not present

## 2020-08-10 DIAGNOSIS — Z1612 Extended spectrum beta lactamase (ESBL) resistance: Secondary | ICD-10-CM | POA: Diagnosis not present

## 2020-08-10 DIAGNOSIS — R197 Diarrhea, unspecified: Secondary | ICD-10-CM | POA: Diagnosis not present

## 2020-08-10 DIAGNOSIS — N1831 Chronic kidney disease, stage 3a: Secondary | ICD-10-CM | POA: Diagnosis not present

## 2020-08-10 DIAGNOSIS — Z7901 Long term (current) use of anticoagulants: Secondary | ICD-10-CM | POA: Diagnosis not present

## 2020-08-10 DIAGNOSIS — Z950 Presence of cardiac pacemaker: Secondary | ICD-10-CM | POA: Diagnosis not present

## 2020-08-10 LAB — BASIC METABOLIC PANEL
Anion gap: 7 (ref 5–15)
BUN: 20 mg/dL (ref 8–23)
CO2: 22 mmol/L (ref 22–32)
Calcium: 9.4 mg/dL (ref 8.9–10.3)
Chloride: 109 mmol/L (ref 98–111)
Creatinine, Ser: 1.36 mg/dL — ABNORMAL HIGH (ref 0.61–1.24)
GFR, Estimated: 50 mL/min — ABNORMAL LOW (ref >60)
Glucose, Bld: 113 mg/dL — ABNORMAL HIGH (ref 70–99)
Potassium: 3.6 mmol/L (ref 3.5–5.1)
Sodium: 138 mmol/L (ref 135–145)

## 2020-08-10 LAB — CBC WITH DIFFERENTIAL/PLATELET
Abs Immature Granulocytes: 0.05 10*3/uL (ref 0.00–0.07)
Basophils Absolute: 0.1 10*3/uL (ref 0.0–0.1)
Basophils Relative: 0 %
Eosinophils Absolute: 3.4 10*3/uL — ABNORMAL HIGH (ref 0.0–0.5)
Eosinophils Relative: 27 %
HCT: 40.6 % (ref 39.0–52.0)
Hemoglobin: 12.7 g/dL — ABNORMAL LOW (ref 13.0–17.0)
Immature Granulocytes: 0 %
Lymphocytes Relative: 16 %
Lymphs Abs: 1.9 10*3/uL (ref 0.7–4.0)
MCH: 25.9 pg — ABNORMAL LOW (ref 26.0–34.0)
MCHC: 31.3 g/dL (ref 30.0–36.0)
MCV: 82.9 fL (ref 80.0–100.0)
Monocytes Absolute: 0.9 10*3/uL (ref 0.1–1.0)
Monocytes Relative: 7 %
Neutro Abs: 6.1 10*3/uL (ref 1.7–7.7)
Neutrophils Relative %: 50 %
Platelets: 172 10*3/uL (ref 150–400)
RBC: 4.9 MIL/uL (ref 4.22–5.81)
RDW: 16.7 % — ABNORMAL HIGH (ref 11.5–15.5)
WBC: 12.3 10*3/uL — ABNORMAL HIGH (ref 4.0–10.5)
nRBC: 0 % (ref 0.0–0.2)

## 2020-08-10 LAB — PROTIME-INR
INR: 3.3 — ABNORMAL HIGH (ref 0.8–1.2)
Prothrombin Time: 33.5 seconds — ABNORMAL HIGH (ref 11.4–15.2)

## 2020-08-10 LAB — MAGNESIUM: Magnesium: 1.4 mg/dL — ABNORMAL LOW (ref 1.7–2.4)

## 2020-08-10 MED ORDER — ACETAMINOPHEN 325 MG PO TABS
650.0000 mg | ORAL_TABLET | Freq: Four times a day (QID) | ORAL | Status: DC | PRN
  Administered 2020-08-10 – 2020-08-19 (×6): 650 mg via ORAL
  Filled 2020-08-10 (×6): qty 2

## 2020-08-10 MED ORDER — MAGNESIUM SULFATE 2 GM/50ML IV SOLN
2.0000 g | Freq: Once | INTRAVENOUS | Status: AC
  Administered 2020-08-10: 2 g via INTRAVENOUS
  Filled 2020-08-10: qty 50

## 2020-08-10 MED ORDER — ZINC OXIDE 12.8 % EX OINT
TOPICAL_OINTMENT | Freq: Two times a day (BID) | CUTANEOUS | Status: DC
  Administered 2020-08-12: 1 via TOPICAL
  Filled 2020-08-10 (×2): qty 56.7

## 2020-08-10 NOTE — Progress Notes (Signed)
Patient ID: [name], male   DOB: 1932/02/01, 87 y.o.   MRN: [id number]  PROGRESS NOTE    PACEY ALTIZER  HOZ:[id number] DOB: [birth year] DOA: 08/08/2020 PCP: Shirline Frees, MD   Brief Narrative:  87 y.o. male with medical history significant for CAD, biventricular cardiac pacemaker, s/p TAVR, persistent atrial fibrillation on Coumadin, combined systolic and diastolic HF, HLD, HTN, OSA on CPAP, hx of DVT/PE s/p IVC, hx of prostate cancer s/p radical prostatectomy, recent right renal subcapsular abscess secondary to ESBL in the setting of nonobstructing nephrolithiasis (followed by urology and ID as an outpatient) treated with 12 weeks of IV ertapenem which got completed on 08/08/2020, recently started on oral vancomycin from 08/06/2020 by ID for positive C. difficile diarrhea presented with worsening diarrhea along with vomiting.  On presentation, he was afebrile and normotensive with no leukocytosis.  C. difficile panel was negative.  Assessment & Plan:   Persistent diarrhea along with nausea and vomiting -Patient had a prolonged course of IV ertapenem for 2 weeks which got completed on 08/08/2020. -Empirically started on oral vancomycin on 08/06/2020 by ID for presumptive C. difficile diarrhea -Presented with worsening diarrhea.  C. difficile panel was negative.  Oral vancomycin has not continued CT of the abdomen and pelvis showed findings consistent with diarrhea -Stool for GI PCR was positive for enteropathogenic E. coli.  ID has started the patient on oral Zithromax for 3 days. -Continue Lomotil.  Diarrhea is significant but improving and becoming more solid directly.  Continue probiotics.  Might have to add cholestyramine if patient continues to have lots of diarrhea.  If diarrhea does not improve with these measures, might have to consider GI eval. -Currently not vomiting.  Advance diet to solid diet.  DC IV fluids.  History of right renal abscess/sinus tract --This was found in an  admission back in May 16, 2018 when he first presented with rectal pain and found to have a perirectal abscess.  He then was also found to have right renal subcapsular abscess secondary to ESBL with subsequent drain placement and placed on prolonged IV ertapenem by ID.  Drain has been removed since May.  He has been followed closely by infectious disease and urology with serial CT abdominal scans. 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 He had a stable 4 cm right renal sinus abscess and the previously seen small subscapular right renal abscess had nearly completely resolved since the prior exam -Completed 12 weeks of IV ertapenem on 08/08/2020.  Ornacycline to be started after completion of oral Zithromax as per Dr. Johnnye Sima.   -CT of the abdomen on presentation showed complex cystic structure in the right renal hilum, similar to prior study: Possible abscesses or complex cysts -Outpatient follow-up with urology.  Remote history of PE/DVT  Persistent atrial fibrillation History of bioprosthetic TAVR Supratherapeutic INR -INR 3.3 today.  Pharmacy consulted to dose Coumadin.  Hold Coumadin dose today.  Monitor INR.  Hypomagnesemia -Replace.  Repeat a.m.  History of heart block status post pacemaker -Stable.  Outpatient follow-up with cardiology  Chronic kidney disease stage IIIa -monitor.  Creatinine stable currently.  Coronary disease status post stent -Stable continue Imdur and statin.  Outpatient follow-up with cardiology  Leukocytosis -Monitor  Hypertension -Continue amlodipine and Imdur  Hyperlipidemia -Continue statin  OSA -Continue CPAP at bedtime  Dermatitis of the buttock region from diffuse diarrhea -Continue topical zinc oxide.  Wound care consultation.  Generalized deconditioning -PT eval   DVT prophylaxis: Coumadin on  hold because of supratherapeutic INR Code Status: Full Family Communication: Wife at bedside Disposition Plan: Status is: Inpatient  Remains inpatient appropriate because:Inpatient level of care appropriate due to severity of illness  Dispo: The patient is from: Home              Anticipated d/c is to: Home in 1 to 2 days if diarrhea improves.              Patient currently is not medically stable to d/c.   Difficult to place patient No  Consultants: Consulted ID  Procedures: None  Antimicrobials: Omadacycline  Subjective: Patient seen and examined at bedside.  Diarrhea is improving and becoming more solid as per wife at bedside.  Tolerating diet.  No overnight fever or vomiting reported.  Feels weak. Objective: Vitals:   08/09/20 2024 08/09/20 2204 08/10/20 0552 08/10/20 0736  BP:  124/70 (!) 148/116 121/61  Pulse:  61 63 63  Resp: 15  20   Temp:   97.8 F (36.6 C)   TempSrc:   Oral   SpO2:   (!) 84% 100%  Weight:      Height:        Intake/Output Summary (Last 24 hours) at 08/10/2020 0858 Last data filed at 08/09/2020 1500 Gross per 24 hour  Intake 1020 ml  Output --  Net 1020 ml   Filed Weights   08/08/20 1339  Weight: 93.4 kg    Examination:  General exam: No acute distress.  On room air currently.  Hard of hearing.   Elderly male lying in bed.  Looks chronically ill. Respiratory system: Decreased breath sounds at bases bilaterally cardiovascular system: Rate controlled, S1-S2 heard  gastrointestinal system: Abdomen is slightly distended, soft and nontender.  Bowel sounds are heard Extremities: Mild lower extremity edema roentgenogram no clubbing  Central nervous system: Awake and alert.  Slow to respond.  No focal neurological deficits.  Moves extremities Skin: No obvious petechiae/rashes psychiatry: Affect is flat Genitourinary: Rectal tube present with semisolid stool present    Data Reviewed: I have personally reviewed following labs and imaging  studies  CBC: Recent Labs  Lab 08/08/20 1443 08/09/20 0523 08/10/20 0433  WBC 10.2 10.6* 12.3*  NEUTROABS 6.8  --  6.1  HGB 14.8 13.4 12.7*  HCT 47.9 42.7 40.6  MCV 83.3 82.6 82.9  PLT 219 201 024    Basic Metabolic Panel: Recent Labs  Lab 08/08/20 1443 08/09/20 0523 08/10/20 0433  NA 140 138 138  K 4.1 4.1 3.6  CL 110 109 109  CO2 20* 22 22  GLUCOSE 132* 111* 113*  BUN 20 19 20   CREATININE 1.25* 1.41* 1.36*  CALCIUM 9.5 9.4 9.4  MG  --   --  1.4*    GFR: Estimated Creatinine Clearance: 42.4 mL/min (A) (by C-G formula based on SCr of 1.36 mg/dL (H)). Liver Function Tests: Recent Labs  Lab 08/08/20 1443  AST 17  ALT 6  ALKPHOS 89  BILITOT 0.5  PROT 7.3  ALBUMIN 3.3*    No results for input(s): LIPASE, AMYLASE in the last 168 hours. No results for input(s): AMMONIA in the last 168 hours. Coagulation Profile: Recent Labs  Lab 08/04/20 0000 08/08/20 1443 08/09/20 0523 08/10/20 0433  INR 2.5 3.3* 3.6* 3.3*    Cardiac Enzymes: No results for input(s): CKTOTAL, CKMB, CKMBINDEX, TROPONINI in the last 168 hours. BNP (last 3 results)  No results for input(s): PROBNP in the last 8760 hours. HbA1C: No results for input(s): HGBA1C in the last 72 hours. CBG: No results for input(s): GLUCAP in the last 168 hours. Lipid Profile: No results for input(s): CHOL, HDL, LDLCALC, TRIG, CHOLHDL, LDLDIRECT in the last 72 hours. Thyroid Function Tests: No results for input(s): TSH, T4TOTAL, FREET4, T3FREE, THYROIDAB in the last 72 hours. Anemia Panel: No results for input(s): VITAMINB12, FOLATE, FERRITIN, TIBC, IRON, RETICCTPCT in the last 72 hours. Sepsis Labs: No results for input(s): PROCALCITON, LATICACIDVEN in the last 168 hours.  Recent Results (from the past 240 hour(s))  C Difficile Quick Screen w PCR reflex     Status: None   Collection Time: 08/08/20  4:30 PM   Specimen: STOOL  Result Value Ref Range Status   C Diff antigen NEGATIVE NEGATIVE Final   C  Diff toxin NEGATIVE NEGATIVE Final   C Diff interpretation No C. difficile detected.  Final    Comment: Performed at [facility], [place] [address], [place], [place] [address]  Gastrointestinal Panel by PCR , Stool     Status: Abnormal   Collection Time: 08/08/20  4:30 PM   Specimen: STOOL  Result Value Ref Range Status   Campylobacter species NOT DETECTED NOT DETECTED Final   Plesimonas shigelloides NOT DETECTED NOT DETECTED Final   Salmonella species NOT DETECTED NOT DETECTED Final   Yersinia enterocolitica NOT DETECTED NOT DETECTED Final   Vibrio species NOT DETECTED NOT DETECTED Final   Vibrio cholerae NOT DETECTED NOT DETECTED Final   Enteroaggregative E coli (EAEC) NOT DETECTED NOT DETECTED Final   Enteropathogenic E coli (EPEC) DETECTED (A) NOT DETECTED Final    Comment: CRITICAL RESULT CALLED TO, READ BACK BY AND VERIFIED WITH: MELISSA HOUSE RN @1408  08/09/20 SCS    Enterotoxigenic E coli (ETEC) NOT DETECTED NOT DETECTED Final   Shiga like toxin producing E coli (STEC) NOT DETECTED NOT DETECTED Final   Shigella/Enteroinvasive E coli (EIEC) NOT DETECTED NOT DETECTED Final   Cryptosporidium NOT DETECTED NOT DETECTED Final   Cyclospora cayetanensis NOT DETECTED NOT DETECTED Final   Entamoeba histolytica NOT DETECTED NOT DETECTED Final   Giardia lamblia NOT DETECTED NOT DETECTED Final   Adenovirus F40/41 NOT DETECTED NOT DETECTED Final   Astrovirus NOT DETECTED NOT DETECTED Final   Norovirus GI/GII NOT DETECTED NOT DETECTED Final   Rotavirus A NOT DETECTED NOT DETECTED Final   Sapovirus (I, II, IV, and V) NOT DETECTED NOT DETECTED Final    Comment: Performed at [facility], [place] [address] [place], [place] [address]  SARS CORONAVIRUS 2 (TAT 6-24 HRS) Nasopharyngeal Nasopharyngeal Swab     Status: None   Collection Time: 08/08/20  8:38 PM   Specimen: Nasopharyngeal Swab  Result Value Ref Range Status   SARS Coronavirus 2 NEGATIVE NEGATIVE Final     Comment: (NOTE) SARS-CoV-2 target nucleic acids are NOT DETECTED.  The SARS-CoV-2 RNA is generally detectable in upper and lower respiratory specimens during the acute phase of infection. Negative results do not preclude SARS-CoV-2 infection, do not rule out co-infections with other pathogens, and should not be used as the sole basis for treatment or other patient management decisions. Negative results must be combined with clinical observations, patient history, and epidemiological information. The expected result is Negative.  Fact Sheet for Patients: SugarRoll.be  Fact Sheet for Healthcare Providers: https://www.woods-mathews.com/  This test is not yet approved or cleared by the [place] and  has been authorized  for detection and/or diagnosis of SARS-CoV-2 by FDA under an Emergency Use Authorization (EUA). This EUA will remain  in effect (meaning this test can be used) for the duration of the COVID-19 declaration under Se ction 564(b)(1) of the Act, 21 U.S.C. section 360bbb-3(b)(1), unless the authorization is terminated or revoked sooner.  Performed at [facility], [place] [address], [place], [place] [address]           Radiology Studies: CT ABDOMEN PELVIS W CONTRAST  Result Date: 08/08/2020 CLINICAL DATA:  Acute abdominal pain. Diarrhea. Abdominal guarding. Patient takes vancomycin. EXAM: CT ABDOMEN AND PELVIS WITH CONTRAST TECHNIQUE: Multidetector CT imaging of the abdomen and pelvis was performed using the standard protocol following bolus administration of intravenous contrast. CONTRAST:  142mL OMNIPAQUE IOHEXOL 300 MG/ML  SOLN COMPARISON:  07/26/2020 FINDINGS: Lower chest: Peripheral fibrosis in the lung bases. Cardiac enlargement. Postoperative changes in the heart. Hepatobiliary: Cholelithiasis. No evidence of cholecystitis. No bile duct dilatation. No focal liver lesions. Pancreas: Unremarkable. No pancreatic  ductal dilatation or surrounding inflammatory changes. Spleen: Normal in size without focal abnormality. Adrenals/Urinary Tract: No adrenal gland nodules. Diffuse renal parenchymal atrophy bilaterally. Focal lesions demonstrated in the right renal hilum. These are low-attenuation with peripheral enhancement or wall thickening and some intraluminal fat. Largest lesion measures about 3.9 cm in diameter. This appearance is similar to prior study and may represent renal abscess or complex cyst. The left kidney demonstrates small parapelvic cysts in small parenchymal cysts. No hydronephrosis or hydroureter. Bladder is unremarkable. Stomach/Bowel: Stomach, small bowel, and colon are not abnormally distended. Diffusely fluid-filled colon consistent with history of diarrhea. No significant colonic or small bowel wall thickening. No pericolonic inflammatory changes. Vascular/Lymphatic: Aortic atherosclerosis. No enlarged abdominal or pelvic lymph nodes. Inferior vena caval filter is in place. Reproductive: Prostate gland is surgically absent. Other: No free air or free fluid in the abdomen. Abdominal wall musculature appears intact. Musculoskeletal: Scoliosis of the lumbar spine convex towards the left. Prominent diffuse degenerative changes. No destructive bone lesions. IMPRESSION: 1. Diffusely fluid-filled colon without distention or significant wall thickening consistent with history of diarrhea. 2. Cholelithiasis without evidence of cholecystitis. 3. Bilateral renal atrophy. Complex cystic structure is demonstrated in the right renal hilum, similar to prior study. Possible abscesses or complex cysts. 4. Aortic atherosclerosis. Electronically Signed   By: Lucienne Capers M.D.   On: 08/08/2020 20:56        Scheduled Meds:  acetaminophen  500 mg Oral QHS   amLODipine  2.5 mg Oral QHS   vitamin C  1,000 mg Oral Daily   azithromycin  500 mg Oral Daily   cholecalciferol  1,000 Units Oral Daily   clotrimazole    Topical Daily   diphenoxylate-atropine  1 tablet Oral QID   isosorbide mononitrate  15 mg Oral QHS   lactobacillus  1 g Oral TID WC   omega-3 acid ethyl esters  1 g Oral Daily   pantoprazole  40 mg Oral Daily   pravastatin  20 mg Oral QHS   vitamin B-12  500 mcg Oral Daily   Warfarin - Pharmacist Dosing Inpatient   Does not apply q1600   Zinc Oxide   Topical Daily   Continuous Infusions:  lactated ringers 50 mL/hr at 08/10/20 0028   magnesium sulfate bolus IVPB            Aline August, MD [facility] 08/10/2020, 8:58 AM

## 2020-08-10 NOTE — Telephone Encounter (Signed)
Medication Samples have been provided to the clinic.  Drug name: Elesa Hacker    Strength: 150 mg   Qty: 30   LOT: [id number]   Exp.Date: 08/2022 NDC: 71715-002-26   Patient will come to the Clinic to pick up medication on 08/11/20.

## 2020-08-10 NOTE — Progress Notes (Signed)
ANTICOAGULATION CONSULT NOTE - Follow Up Consult  Pharmacy Consult for warfarin Indication: hx atrial fibrillation, pulmonary embolus, and DVT  Allergies  Allergen Reactions   Darifenacin Nausea Only and Other (See Comments)    dry mouth and dizziness ENABLEX Dizziness Pt denies   Codeine Other (See Comments)   Sulfamethoxazole-Trimethoprim Other (See Comments)   Lisinopril Cough    Pt denies    Patient Measurements: Height: 5\' 8"  (172.7 cm) Weight: 93.4 kg (206 lb) IBW/kg (Calculated) : 68.4 Heparin Dosing Weight:   Vital Signs: Temp: 97.8 F (36.6 C) (07/05 0552) Temp Source: Oral (07/05 0552) BP: 121/61 (07/05 0736) Pulse Rate: 63 (07/05 0736)  Labs: Recent Labs    08/08/20 1443 08/09/20 0523 08/10/20 0433  HGB 14.8 13.4 12.7*  HCT 47.9 42.7 40.6  PLT 219 201 172  LABPROT 33.5* 36.1* 33.5*  INR 3.3* 3.6* 3.3*  CREATININE 1.25* 1.41* 1.36*     Estimated Creatinine Clearance: 42.4 mL/min (A) (by C-G formula based on SCr of 1.36 mg/dL (H)).   Medications:  PTA warfarin regimen: 2.5mg  daily except 1.25mg  on Wed and Sat  Assessment: Patient is an 87 y.o M with hx DVT/PE (s/p IVC filter) and afib on warfarin PTA, presented to the ED on 7/3 with c/o diarrhea and vomiting.  Warfarin resumed on admission.  Today, 08/10/2020: INR is still SUPRAtherapeutic despite no warfarin given the last 3 days  Poor oral intake can make pt more sensitive to warfarin and patient admitted with diarrhea No reported bleeding CBC stable SCr 1.36 fairly stable   Goal of Therapy:  INR 2-3 Monitor platelets by anticoagulation protocol: Yes   Plan:  Continue to hold warfarin today for INR >3 Daily INR Monitor for s/sx bleeding  Adrian Saran, PharmD, BCPS Secure Chat if ?s 08/10/2020 7:56 AM

## 2020-08-10 NOTE — Telephone Encounter (Signed)
I saw that. I have his 2 weeks of omadacycline here at the clinic. We have called his wife to let her know.

## 2020-08-10 NOTE — Progress Notes (Signed)
[facility] for Infectious Disease    Date of Admission:  08/08/2020   Total days of antibiotics day 2 azithro          ID: Chance P Ilic is a 87 y.o. male with  ESBL ecoli renal abscess but developed profuse diarrhea 5-6 days prior to admit from [facility]. Possible contaminated food Principal Problem:   Diarrhea Active Problems:   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)   Long term (current) use of anticoagulants   CAD (coronary artery disease), native coronary artery   Persistent atrial fibrillation (HCC)   Cardiac pacemaker in situ   CKD (chronic kidney disease), stage III (HCC)   S/P TAVR (transcatheter aortic valve replacement)   OSA on CPAP   Presence of IVC filter   Renal abscess    Subjective: Afebrile, still requiring rectal tube but out put has decreased, he is feeling better, appetite has returned Repeat abd/pelvic CT showing renal complex cysts vs abscess (Favor the latter given recently treated for cx + esbl ecoli renal abscess)  ROS: +diarrhea, no cramping, no blood. No n/v. No dysuria 12 point ros is otherwise negative  Medications:   acetaminophen  500 mg Oral QHS   amLODipine  2.5 mg Oral QHS   vitamin C  1,000 mg Oral Daily   azithromycin  500 mg Oral Daily   cholecalciferol  1,000 Units Oral Daily   diphenoxylate-atropine  1 tablet Oral QID   isosorbide mononitrate  15 mg Oral QHS   lactobacillus  1 g Oral TID WC   omega-3 acid ethyl esters  1 g Oral Daily   pantoprazole  40 mg Oral Daily   pravastatin  20 mg Oral QHS   vitamin B-12  500 mcg Oral Daily   Warfarin - Pharmacist Dosing Inpatient   Does not apply q1600   Zinc Oxide   Topical BID    Objective: Vital signs in last 24 hours: Temp:  [97.8 F (36.6 C)-98 F (36.7 C)] 97.9 F (36.6 C) (07/05 1319) Pulse Rate:  [61-91] 61 (07/05 1319) Resp:  [15-20] 18 (07/05 1319) BP: (121-148)/(61-116) 130/65 (07/05 1319) SpO2:  [77 %-100 %] 100 % (07/05  1319) Physical Exam  Constitutional: He is oriented to person, place, and time. He appears well-developed and well-nourished. No distress.  HENT:  Mouth/Throat: Oropharynx is clear and moist. No oropharyngeal exudate.  Cardiovascular: Normal rate, regular rhythm and normal heart sounds. Exam reveals no gallop and no friction rub.  No murmur heard.  Pulmonary/Chest: Effort normal and breath sounds normal. No respiratory distress. He has no wheezes.  Abdominal: Soft. Bowel sounds are normal. He exhibits no distension. There is no tenderness.  Lymphadenopathy:  He has no cervical adenopathy.  Neurological: He is alert and oriented to person, place, and time.  Skin: Skin is warm and dry. No rash noted. No erythema.  Psychiatric: He has a normal mood and affect. His behavior is normal.    Lab Results Recent Labs    08/09/20 0523 08/10/20 0433  WBC 10.6* 12.3*  HGB 13.4 12.7*  HCT 42.7 40.6  NA 138 138  K 4.1 3.6  CL 109 109  CO2 22 22  BUN 19 20  CREATININE 1.41* 1.36*   Liver Panel Recent Labs    08/08/20 1443  PROT 7.3  ALBUMIN 3.3*  AST 17  ALT 6  ALKPHOS 89  BILITOT 0.5     Microbiology: reviewed Studies/Results: CT ABDOMEN PELVIS W  CONTRAST  Result Date: 08/08/2020 CLINICAL DATA:  Acute abdominal pain. Diarrhea. Abdominal guarding. Patient takes vancomycin. EXAM: CT ABDOMEN AND PELVIS WITH CONTRAST TECHNIQUE: Multidetector CT imaging of the abdomen and pelvis was performed using the standard protocol following bolus administration of intravenous contrast. CONTRAST:  160mL OMNIPAQUE IOHEXOL 300 MG/ML  SOLN COMPARISON:  07/26/2020 FINDINGS: Lower chest: Peripheral fibrosis in the lung bases. Cardiac enlargement. Postoperative changes in the heart. Hepatobiliary: Cholelithiasis. No evidence of cholecystitis. No bile duct dilatation. No focal liver lesions. Pancreas: Unremarkable. No pancreatic ductal dilatation or surrounding inflammatory changes. Spleen: Normal in size  without focal abnormality. Adrenals/Urinary Tract: No adrenal gland nodules. Diffuse renal parenchymal atrophy bilaterally. Focal lesions demonstrated in the right renal hilum. These are low-attenuation with peripheral enhancement or wall thickening and some intraluminal fat. Largest lesion measures about 3.9 cm in diameter. This appearance is similar to prior study and may represent renal abscess or complex cyst. The left kidney demonstrates small parapelvic cysts in small parenchymal cysts. No hydronephrosis or hydroureter. Bladder is unremarkable. Stomach/Bowel: Stomach, small bowel, and colon are not abnormally distended. Diffusely fluid-filled colon consistent with history of diarrhea. No significant colonic or small bowel wall thickening. No pericolonic inflammatory changes. Vascular/Lymphatic: Aortic atherosclerosis. No enlarged abdominal or pelvic lymph nodes. Inferior vena caval filter is in place. Reproductive: Prostate gland is surgically absent. Other: No free air or free fluid in the abdomen. Abdominal wall musculature appears intact. Musculoskeletal: Scoliosis of the lumbar spine convex towards the left. Prominent diffuse degenerative changes. No destructive bone lesions. IMPRESSION: 1. Diffusely fluid-filled colon without distention or significant wall thickening consistent with history of diarrhea. 2. Cholelithiasis without evidence of cholecystitis. 3. Bilateral renal atrophy. Complex cystic structure is demonstrated in the right renal hilum, similar to prior study. Possible abscesses or complex cysts. 4. Aortic atherosclerosis. Electronically Signed   By: Lucienne Capers M.D.   On: 08/08/2020 20:56     Assessment/Plan: EPEC diarrhea = continue on azithromycin. Appears improving. Treatment due to severe diarrheal disease  Renal abscess= for the time being hold off on treatment, only missing a few days. Once his diarrhea improved, will start omadacycline (which we will deliver to  patient)  Loss of appetite = improving  Recommend to keep another day to observe that he continues to improve.   [facility] for Infectious Diseases Cell: [phone] Pager: [phone]  08/10/2020, 4:19 PM

## 2020-08-10 NOTE — Consult Note (Signed)
[facility] Nurse Consult Note: Patient receiving care in [phone]. Reason for Consult: buttocks dermatitis from severe diarrhea Wound type: MASD-IAD Pressure Injury POA: Yes/No/NA Measurement: Wound bed: Drainage (amount, consistency, odor)  Periwound: Dressing procedure/placement/frequency: Upon reviewing the chart I see MAR entries for two products to the affected areas: Lotrimin and Triple Paste.  Please follow these orders.  Ordering provider may wish to increase the frequency of application of these products as they are currently ordered for daily.  [facility] nurse will not follow at this time.  Please re-consult the [facility] team if needed.  Val Riles, RN, MSN, CWOCN, CNS-BC, pager [phone]

## 2020-08-10 NOTE — Evaluation (Signed)
Physical Therapy Evaluation Patient Details Name: [name] MRN: [id number] DOB: 1931/02/09 Today's Date: 08/10/2020   History of Present Illness  87 y.o. male with medical history significant for CAD, biventricular cardiac pacemaker, s/p TAVR, persistent atrial fibrillation on Coumadin, combined systolic and diastolic HF, HLD, HTN, OSA on CPAP, hx of DVT/PE s/p IVC, hx of prostate cancer s/p radical prostatectomy, recent right renal subcapsular abscess secondary to ESBL in the setting of nonobstructing nephrolithiasis.  Recently started on oral vancomycin for positive C. difficile diarrhea presented 08/09/20 with worsening diarrhea along with vomiting.  On presentation, he was afebrile and normotensive with no leukocytosis.  C. difficile panel was negative.  Clinical Impression  Patient resting in bed, wife at  bedside. Flexiseal in place and draining. Patient reports very painful periarea with any pressure.Placed bed in chair position to test out his  tolerance to sitting. Patient tolerated x 10",  Patient performed active Leg exercises and pushed legs on foot board x 10.Patient may benefit from use of slide sheet to facilitate moving to sitting  to prevent shearing of excoriated buttocks. Will  attempt next visit.  Pt admitted with above diagnosis.  Pt currently with functional limitations due to the deficits listed below (see PT Problem List). Pt will benefit from skilled PT to increase their independence and safety with mobility to allow discharge to the venue listed below.       Follow Up Recommendations Home health PT    Equipment Recommendations  None recommended by PT (patient slide sheet information given to wife)    Recommendations for Other Services       Precautions / Restrictions Precautions Precaution Comments: very painful periarea.scrotal, don't sheer area , Flexiseal     Mobility  Bed Mobility               General bed mobility comments: patient  in bed with  scrotum elevated.Patient wary to try to sit on bed edge. Bed palced in  chair position and tilted down . Patient felt pressure but tolerated. Patient sat x 10 minutes then retuned legs  elevated    Transfers                 General transfer comment: TBA  Ambulation/Gait                Stairs            Wheelchair Mobility    Modified Rankin (Stroke Patients Only)       Balance Overall balance assessment: Independent                                           Pertinent Vitals/Pain Pain Assessment: Faces Faces Pain Scale: Hurts even more Pain Location: scrotal and perianal, has flexiseal Pain Descriptors / Indicators: Burning;Discomfort;Grimacing;Guarding Pain Intervention(s): Limited activity within patient's tolerance    Home Living Family/patient expects to be discharged to:: Private residence   Available Help at Discharge: Family;Available 24 hours/day Type of Home: House Home Access: Stairs to enter   [employer] of Steps: 1 Home Layout: One level Home Equipment: Walker - 4 wheels;Grab bars - tub/shower;Grab bars - toilet;Shower seat - built in      Prior Function Level of Independence: Needs assistance   Gait / Transfers Assistance Needed: walks with rollator with platforms, has had 1 fall with left rib fractures.  ADL's / Homemaking  Assistance Needed: showers in sitting with wife assisting with transfers, supervising and drying pt, pt is fatigued after showering, wife helps put his clothes on        Hand Dominance        Extremity/Trunk Assessment   Upper Extremity Assessment Upper Extremity Assessment: Overall WFL for tasks assessed    Lower Extremity Assessment Lower Extremity Assessment: Generalized weakness    Cervical / Trunk Assessment Cervical / Trunk Assessment: Normal  Communication      Cognition Arousal/Alertness: Awake/alert Behavior During Therapy: WFL for tasks  assessed/performed Overall Cognitive Status: Within Functional Limits for tasks assessed                                 General Comments: wife does correct patient at times.      General Comments      Exercises General Exercises - Lower Extremity Long Arc Quad: AROM;Both;10 reps;Seated Hip Flexion/Marching: AROM;Both;10 reps;Seated   Assessment/Plan    PT Assessment Patient needs continued PT services  PT Problem List Decreased strength;Decreased mobility;Decreased knowledge of precautions;Decreased activity tolerance;Decreased balance;Decreased knowledge of use of DME;Pain       PT Treatment Interventions DME instruction;Therapeutic activities;Gait training;Therapeutic exercise;Patient/family education;Functional mobility training    PT Goals (Current goals can be found in the Care Plan section)  Acute Rehab PT Goals Patient Stated Goal: get up ,walk PT Goal Formulation: With patient/family Time For Goal Achievement: 08/24/20 Potential to Achieve Goals: Good    Frequency Min 3X/week   Barriers to discharge        Co-evaluation               AM-PAC PT "6 Clicks" Mobility  Outcome Measure Help needed turning from your back to your side while in a flat bed without using bedrails?: A Lot Help needed moving from lying on your back to sitting on the side of a flat bed without using bedrails?: A Lot Help needed moving to and from a bed to a chair (including a wheelchair)?: A Lot Help needed standing up from a chair using your arms (e.g., wheelchair or bedside chair)?: A Lot Help needed to walk in hospital room?: A Lot Help needed climbing 3-5 steps with a railing? : Total 6 Click Score: 11    End of Session   Activity Tolerance: Patient tolerated treatment well Patient left: in bed;with call bell/phone within reach;with family/visitor present;with bed alarm set Nurse Communication: Mobility status PT Visit Diagnosis: Unsteadiness on feet  (R26.81);History of falling (Z91.81);Muscle weakness (generalized) (M62.81);Difficulty in walking, not elsewhere classified (R26.2);Pain    Time: 6962-9528 PT Time Calculation (min) (ACUTE ONLY): 39 min   Charges:   PT Evaluation $PT Eval Low Complexity: 1 Low PT Treatments $Therapeutic Activity: 23-37 mins        Tresa Endo PT Acute Rehabilitation Services  Office [phone]   Claretha Cooper 08/10/2020, 3:48 PM

## 2020-08-11 ENCOUNTER — Telehealth: Payer: Self-pay

## 2020-08-11 ENCOUNTER — Encounter (HOSPITAL_COMMUNITY): Payer: Self-pay | Admitting: Internal Medicine

## 2020-08-11 DIAGNOSIS — Z950 Presence of cardiac pacemaker: Secondary | ICD-10-CM | POA: Diagnosis not present

## 2020-08-11 DIAGNOSIS — I2510 Atherosclerotic heart disease of native coronary artery without angina pectoris: Secondary | ICD-10-CM | POA: Diagnosis not present

## 2020-08-11 DIAGNOSIS — N1831 Chronic kidney disease, stage 3a: Secondary | ICD-10-CM | POA: Diagnosis not present

## 2020-08-11 DIAGNOSIS — A09 Infectious gastroenteritis and colitis, unspecified: Secondary | ICD-10-CM | POA: Diagnosis not present

## 2020-08-11 LAB — PROTIME-INR
INR: 2.8 — ABNORMAL HIGH (ref 0.8–1.2)
Prothrombin Time: 29.6 seconds — ABNORMAL HIGH (ref 11.4–15.2)

## 2020-08-11 LAB — CBC WITH DIFFERENTIAL/PLATELET
Abs Immature Granulocytes: 0.06 10*3/uL (ref 0.00–0.07)
Basophils Absolute: 0.1 10*3/uL (ref 0.0–0.1)
Basophils Relative: 0 %
Eosinophils Absolute: 3.4 10*3/uL — ABNORMAL HIGH (ref 0.0–0.5)
Eosinophils Relative: 26 %
HCT: 39.6 % (ref 39.0–52.0)
Hemoglobin: 12.4 g/dL — ABNORMAL LOW (ref 13.0–17.0)
Immature Granulocytes: 1 %
Lymphocytes Relative: 15 %
Lymphs Abs: 2 10*3/uL (ref 0.7–4.0)
MCH: 25.8 pg — ABNORMAL LOW (ref 26.0–34.0)
MCHC: 31.3 g/dL (ref 30.0–36.0)
MCV: 82.5 fL (ref 80.0–100.0)
Monocytes Absolute: 1.1 10*3/uL — ABNORMAL HIGH (ref 0.1–1.0)
Monocytes Relative: 8 %
Neutro Abs: 6.3 10*3/uL (ref 1.7–7.7)
Neutrophils Relative %: 50 %
Platelets: 170 10*3/uL (ref 150–400)
RBC: 4.8 MIL/uL (ref 4.22–5.81)
RDW: 16.6 % — ABNORMAL HIGH (ref 11.5–15.5)
WBC: 12.9 10*3/uL — ABNORMAL HIGH (ref 4.0–10.5)
nRBC: 0 % (ref 0.0–0.2)

## 2020-08-11 LAB — BASIC METABOLIC PANEL
Anion gap: 6 (ref 5–15)
BUN: 19 mg/dL (ref 8–23)
CO2: 22 mmol/L (ref 22–32)
Calcium: 9.3 mg/dL (ref 8.9–10.3)
Chloride: 110 mmol/L (ref 98–111)
Creatinine, Ser: 1.4 mg/dL — ABNORMAL HIGH (ref 0.61–1.24)
GFR, Estimated: 49 mL/min — ABNORMAL LOW (ref >60)
Glucose, Bld: 108 mg/dL — ABNORMAL HIGH (ref 70–99)
Potassium: 4.2 mmol/L (ref 3.5–5.1)
Sodium: 138 mmol/L (ref 135–145)

## 2020-08-11 LAB — MAGNESIUM: Magnesium: 1.7 mg/dL (ref 1.7–2.4)

## 2020-08-11 MED ORDER — HYDROMORPHONE HCL 1 MG/ML IJ SOLN
0.5000 mg | INTRAMUSCULAR | Status: DC | PRN
  Administered 2020-08-11 – 2020-08-13 (×3): 0.5 mg via INTRAVENOUS
  Filled 2020-08-11 (×4): qty 0.5

## 2020-08-11 MED ORDER — TRAMADOL HCL 50 MG PO TABS
50.0000 mg | ORAL_TABLET | Freq: Two times a day (BID) | ORAL | Status: DC | PRN
  Administered 2020-08-12: 50 mg via ORAL
  Filled 2020-08-11: qty 1

## 2020-08-11 MED ORDER — WARFARIN SODIUM 1 MG PO TABS
1.0000 mg | ORAL_TABLET | Freq: Once | ORAL | Status: AC
  Administered 2020-08-11: 1 mg via ORAL
  Filled 2020-08-11: qty 1

## 2020-08-11 MED ORDER — LIDOCAINE 4 % EX CREA
TOPICAL_CREAM | Freq: Four times a day (QID) | CUTANEOUS | Status: DC | PRN
  Filled 2020-08-11 (×6): qty 5

## 2020-08-11 MED ORDER — TRAMADOL HCL 50 MG PO TABS
50.0000 mg | ORAL_TABLET | Freq: Two times a day (BID) | ORAL | Status: DC | PRN
  Administered 2020-08-11: 50 mg via ORAL
  Filled 2020-08-11: qty 1

## 2020-08-11 NOTE — Progress Notes (Signed)
Physical Therapy Treatment Patient Details Name: [name] MRN: [id number] DOB: [birth year] Today's Date: 08/11/2020    History of Present Illness 87 y.o. male with medical history significant for CAD, biventricular cardiac pacemaker, s/p TAVR, persistent atrial fibrillation on Coumadin, combined systolic and diastolic HF, HLD, HTN, OSA on CPAP, hx of DVT/PE s/p IVC, hx of prostate cancer s/p radical prostatectomy, recent right renal subcapsular abscess secondary to ESBL in the setting of nonobstructing nephrolithiasis.  Recently started on oral vancomycin for positive C. difficile diarrhea presented 08/09/20 with worsening diarrhea along with vomiting.  On presentation, he was afebrile and normotensive with no leukocytosis.  C. difficile panel was negative. Recent fall with four fractured ribs.    PT Comments    Patient reports less discomfort in periarea but  still very tender when cleaning area.  Patient did  mobilize to sit on bed edge, Purple slide sheet utilized to assist with less sheer to buttocks when turning on bed to sit up right. Patient  did ambulate using Eva ,bilateral Rw ,with 2 min assist. Limited in room due to contact and incontinence.   Follow Up Recommendations  Home health PT     Equipment Recommendations  None recommended by PT    Recommendations for Other Services       Precautions / Restrictions Precautions Precaution Comments: very painful periarea.scrotal, don't sheer area, use slide sheet under bed pad ,will  help Restrictions Weight Bearing Restrictions: No  Incontinent of urine- has depends in room, may have flexiseal out     Mobility  Bed Mobility Rolles to right with min assist, mod assist  to left  due to rib fracture pain. Overall bed mobility: Needs Assistance Bed Mobility: Supine to Sit     Supine to sit: Mod assist     General bed mobility comments: purple slide sheet placed under bed pad to facilitate sliding as patient assisted to  sitting with mod assist. patient able to scoot self to bed edge. Assist legs back onto bed,     Transfers Overall transfer level: Needs assistance Equipment used: Right platform walker Transfers: Sit to/from Stand Sit to Stand: Min assist;From elevated surface;+2 safety/equipment         General transfer comment: Steady assist to rise from bed, cues to place arms on platform of [place]. Able to move arms to reach back to bed  Ambulation/Gait Ambulation/Gait assistance: Min assist;+2 safety/equipment Gait Distance (Feet): 30 Feet Assistive device: Right platform walker Gait Pattern/deviations: Step-through pattern Gait velocity: decr   General Gait Details: patient incontinent of urine upon standing. Patient steady with [place] platform Rw, +2 for safety with flexiseal and incontinence   Stairs             Wheelchair Mobility    Modified Rankin (Stroke Patients Only)       Balance Overall balance assessment: Needs assistance Sitting-balance support: Feet supported;Bilateral upper extremity supported Sitting balance-Leahy Scale: Fair     Standing balance support: Bilateral upper extremity supported;During functional activity Standing balance-Leahy Scale: Fair Standing balance comment: required UE support                            Cognition Arousal/Alertness: Awake/alert   Overall Cognitive Status: History of cognitive impairments - at baseline  Exercises      General Comments        Pertinent Vitals/Pain Faces Pain Scale: Hurts little more Pain Location: scrotal and perianal, has flexiseal Pain Descriptors / Indicators: Burning;Discomfort;Grimacing;Guarding Pain Intervention(s): Monitored during session    Home Living                      Prior Function            PT Goals (current goals can now be found in the care plan section) Progress towards PT goals: Progressing toward  goals    Frequency    Min 3X/week      PT Plan Current plan remains appropriate    Co-evaluation              AM-PAC PT "6 Clicks" Mobility   Outcome Measure  Help needed turning from your back to your side while in a flat bed without using bedrails?: A Lot Help needed moving from lying on your back to sitting on the side of a flat bed without using bedrails?: A Lot Help needed moving to and from a bed to a chair (including a wheelchair)?: A Lot Help needed standing up from a chair using your arms (e.g., wheelchair or bedside chair)?: A Lot Help needed to walk in hospital room?: A Lot Help needed climbing 3-5 steps with a railing? : A Lot 6 Click Score: 12    End of Session Equipment Utilized During Treatment: Gait belt Activity Tolerance: Patient tolerated treatment well Patient left: in bed;with call bell/phone within reach;with family/visitor present;with bed alarm set Nurse Communication: Mobility status PT Visit Diagnosis: Unsteadiness on feet (R26.81);History of falling (Z91.81);Muscle weakness (generalized) (M62.81);Difficulty in walking, not elsewhere classified (R26.2);Pain     Time: 3704-8889 PT Time Calculation (min) (ACUTE ONLY): 41 min  Charges:  $Gait Training: 23-37 mins $Therapeutic Activity: 8-22 mins                     Tresa Endo PT Acute Rehabilitation Services  Office [phone]   Claretha Cooper 08/11/2020, 1:15 PM

## 2020-08-11 NOTE — Telephone Encounter (Signed)
He was also approved for patient assistance. He should be getting the omada in the mail tomorrow or Friday.

## 2020-08-11 NOTE — Progress Notes (Signed)
[facility] for Infectious Disease    Date of Admission:  08/08/2020   Total days of antibiotics 3-azithromycin           ID: [name] is a 87 y.o. male with EPEC diarrhea and hx of ESBL ecoli renal abscess Principal Problem:   Diarrhea Active Problems:   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)   Long term (current) use of anticoagulants   CAD (coronary artery disease), native coronary artery   Persistent atrial fibrillation (HCC)   Cardiac pacemaker in situ   CKD (chronic kidney disease), stage III (HCC)   S/P TAVR (transcatheter aortic valve replacement)   OSA on CPAP   Presence of IVC filter   Renal abscess   Infectious diarrhea   ESBL (extended spectrum beta-lactamase) producing bacteria infection    Subjective: Afebrile, no longer requiring rectal tube but still having loose stools, frequent.   Ros: 12 point ros is otherwise negative.No abdominal pain, nor fever, no n/v. Appetite improved  Medications:   amLODipine  2.5 mg Oral QHS   vitamin C  1,000 mg Oral Daily   cholecalciferol  1,000 Units Oral Daily   diphenoxylate-atropine  1 tablet Oral QID   isosorbide mononitrate  15 mg Oral QHS   lactobacillus  1 g Oral TID WC   omega-3 acid ethyl esters  1 g Oral Daily   pantoprazole  40 mg Oral Daily   pravastatin  20 mg Oral QHS   vitamin B-12  500 mcg Oral Daily   Warfarin - Pharmacist Dosing Inpatient   Does not apply q1600   Zinc Oxide   Topical BID    Objective: Vital signs in last 24 hours: Temp:  [97.8 F (36.6 C)-99.2 F (37.3 C)] 97.8 F (36.6 C) (07/06 1302) Pulse Rate:  [60-65] 63 (07/06 1302) Resp:  [16-17] 17 (07/06 1302) BP: (111-128)/(59-66) 127/66 (07/06 1302) SpO2:  [92 %-98 %] 98 % (07/06 1302) Weight:  [92.2 kg] 92.2 kg (07/06 0500)  Physical Exam  Constitutional: He is oriented to person, place, and time. He appears well-developed and well-nourished. No distress.  HENT:  Mouth/Throat:  Oropharynx is clear and moist. No oropharyngeal exudate.  Cardiovascular: Normal rate, regular rhythm and normal heart sounds. Exam reveals no gallop and no friction rub.  No murmur heard.  Pulmonary/Chest: Effort normal and breath sounds normal. No respiratory distress. He has no wheezes.  Abdominal: Soft. Bowel sounds are normal. He exhibits no distension. There is no tenderness.  Lymphadenopathy:  He has no cervical adenopathy.  Neurological: He is alert and oriented to person, place, and time.  Skin: Skin is warm and dry. No rash noted. No erythema.  Psychiatric: He has a normal mood and affect. His behavior is normal.    Lab Results Recent Labs    08/10/20 0433 08/11/20 0421  WBC 12.3* 12.9*  HGB 12.7* 12.4*  HCT 40.6 39.6  NA 138 138  K 3.6 4.2  CL 109 110  CO2 22 22  BUN 20 19  CREATININE 1.36* 1.40*     Microbiology: reviewed Studies/Results: No results found.   Assessment/Plan: Severe diarrhea from EPEC = continue with azithromycin. Anticipate to improve over next 24-48hrs  Esbl ecoli renal abscess= repeat imaging suggests still there, plan is to start on omadacycline (oral regimen at home ) upon discharge. For the time being, has received IV therapy, temporarily held while we treat his diarrheal infection  Rectal pain = consider lidocaine  cream about the area from frequent diarrhea  [facility] for Infectious Diseases Cell: [phone] Pager: [phone]  08/11/2020, 5:28 PM

## 2020-08-11 NOTE — Telephone Encounter (Signed)
[facility] Patient Advocate Encounter  Completed and sent [facility] Patient Assistance application for Elesa Hacker for this patient who is uninsured.    Patient is approved 08/11/20 through 08/10/21.  Medication will be mailed to the patient home address from [facility].   Ileene Patrick, [facility] Patient [facility] for Infectious Disease Phone: [phone] Fax:  [phone]

## 2020-08-11 NOTE — Progress Notes (Signed)
PROGRESS NOTE  [name]  WEX:[id number] DOB: 1931/02/14 DOA: 08/08/2020 PCP: Shirline Frees, MD  Outpatient Specialists: [facility] Brief Narrative: 87 y.o. male with medical history significant for CAD, biventricular cardiac pacemaker, s/p TAVR, persistent atrial fibrillation on Coumadin, combined systolic and diastolic HF, HLD, HTN, OSA on CPAP, hx of DVT/PE s/p IVC, hx of prostate cancer s/p radical prostatectomy, recent right renal subcapsular abscess secondary to ESBL in the setting of nonobstructing nephrolithiasis (followed by urology and ID as an outpatient) treated with 12 weeks of IV ertapenem which got completed on 08/08/2020, recently started on oral vancomycin from 08/06/2020 by ID for positive C. difficile diarrhea presented with worsening diarrhea along with vomiting.  On presentation, he was afebrile and normotensive with no leukocytosis.  C. difficile panel was negative.  Assessment & Plan: Principal Problem:   Diarrhea Active Problems:   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)   Long term (current) use of anticoagulants   CAD (coronary artery disease), native coronary artery   Persistent atrial fibrillation (HCC)   Cardiac pacemaker in situ   CKD (chronic kidney disease), stage III (HCC)   S/P TAVR (transcatheter aortic valve replacement)   OSA on CPAP   Presence of IVC filter   Renal abscess   Infectious diarrhea   ESBL (extended spectrum beta-lactamase) producing bacteria infection  Enteropathogenic E. coli with severe and persistent diarrhea: C. diff negative.  - Due to severity of symptoms and old age/risk of worsening, azithromycin is being administered.  - Continue lomotil.  - DC rectal tube today as consistency is firming up/feces running out sides. Needs barrier cream applied liberally and often.    ESBL E. coli renal abscess: Remains on repeat imaging. Completed 12 weeks of IV ertapenem on 08/08/2020. - Plan to start omadacycline at  home (arranged through [place]) - Urology follow up planned   Remote history of PE/DVT Persistent atrial fibrillation History of bioprosthetic TAVR Supratherapeutic INR - Continue coumadin per pharmacy, daily INR checks.    Hypomagnesemia: Improved.  - Monitor   Heart block s/p pacemaker, CAD s/p PCI:  -  Continue imdur, statin, coumadin - Outpatient follow-up with cardiology  Chronic kidney disease stage IIIa: At baseline.  - Avoid nephrotoxins   Leukocytosis - Monitor  Hypertension - Continue amlodipine and imdur  Hyperlipidemia - Continue statin  OSA - Continue CPAP at bedtime  Dermatitis of the buttock region from diffuse diarrhea - Continue topical zinc oxide.  Wound care consultation.   Generalized deconditioning - PT eval  Obesity: Estimated body mass index is 30.9 kg/m as calculated from the following:   Height as of this encounter: 5\' 8"  (1.727 m).   Weight as of this encounter: 92.2 kg.  DVT prophylaxis: Coumadin Code Status: Full Family Communication: Wife at bedside Disposition Plan:  Status is: Inpatient  Remains inpatient appropriate because:Inpatient level of care appropriate due to severity of illness  Dispo: The patient is from: Home              Anticipated d/c is to: Home              Patient currently is not medically stable to d/c.   Difficult to place patient No  Consultants:  ID  Procedures:  Rectal tube  Antimicrobials: Azithromycin   Subjective: Feels "fine" without any pain reported, though confirms any touching of rectal area is severely tender. RN reports excoriations  treated with cream. Continues to have frequent stools around the rectal  tube and leaking while working with PT.   Objective: Vitals:   08/10/20 2107 08/11/20 0419 08/11/20 0500 08/11/20 1302  BP: 128/64 (!) 111/59  127/66  Pulse: 60 65  63  Resp: 16 16  17   Temp: 99.2 F (37.3 C) 98.1 F (36.7 C)  97.8 F (36.6 C)  TempSrc: Oral Oral  Oral  SpO2: 92%  98%  98%  Weight:   92.2 kg   Height:        Intake/Output Summary (Last 24 hours) at 08/11/2020 1729 Last data filed at 08/11/2020 1028 Gross per 24 hour  Intake 474 ml  Output 900 ml  Net -426 ml   Filed Weights   08/08/20 1339 08/11/20 0500  Weight: 93.4 kg 92.2 kg    Gen: Elderly male in no distress Pulm: Non-labored breathing. Clear to auscultation bilaterally.  CV: Regular rate and rhythm. No murmur, rub, or gallop. No JVD, no pitting pedal edema. GI: Abdomen soft, non-tender, non-distended, with normoactive bowel sounds. No organomegaly or masses felt. Ext: Warm, no deformities Skin: No rashes, lesions or ulcers on visualized skin Neuro: Alert and oriented. HOH with no focal neurological deficits. Psych: Judgement and insight appear fair, Mood & affect appropriate.   Data Reviewed: I have personally reviewed following labs and imaging studies  CBC: Recent Labs  Lab 08/08/20 1443 08/09/20 0523 08/10/20 0433 08/11/20 0421  WBC 10.2 10.6* 12.3* 12.9*  NEUTROABS 6.8  --  6.1 6.3  HGB 14.8 13.4 12.7* 12.4*  HCT 47.9 42.7 40.6 39.6  MCV 83.3 82.6 82.9 82.5  PLT 219 201 172 354   Basic Metabolic Panel: Recent Labs  Lab 08/08/20 1443 08/09/20 0523 08/10/20 0433 08/11/20 0421  NA 140 138 138 138  K 4.1 4.1 3.6 4.2  CL 110 109 109 110  CO2 20* 22 22 22   GLUCOSE 132* 111* 113* 108*  BUN 20 19 20 19   CREATININE 1.25* 1.41* 1.36* 1.40*  CALCIUM 9.5 9.4 9.4 9.3  MG  --   --  1.4* 1.7   GFR: Estimated Creatinine Clearance: 41 mL/min (A) (by C-G formula based on SCr of 1.4 mg/dL (H)). Liver Function Tests: Recent Labs  Lab 08/08/20 1443  AST 17  ALT 6  ALKPHOS 89  BILITOT 0.5  PROT 7.3  ALBUMIN 3.3*   No results for input(s): LIPASE, AMYLASE in the last 168 hours. No results for input(s): AMMONIA in the last 168 hours. Coagulation Profile: Recent Labs  Lab 08/08/20 1443 08/09/20 0523 08/10/20 0433 08/11/20 0421  INR 3.3* 3.6* 3.3* 2.8*   Cardiac  Enzymes: No results for input(s): CKTOTAL, CKMB, CKMBINDEX, TROPONINI in the last 168 hours. BNP (last 3 results) No results for input(s): PROBNP in the last 8760 hours. HbA1C: No results for input(s): HGBA1C in the last 72 hours. CBG: No results for input(s): GLUCAP in the last 168 hours. Lipid Profile: No results for input(s): CHOL, HDL, LDLCALC, TRIG, CHOLHDL, LDLDIRECT in the last 72 hours. Thyroid Function Tests: No results for input(s): TSH, T4TOTAL, FREET4, T3FREE, THYROIDAB in the last 72 hours. Anemia Panel: No results for input(s): VITAMINB12, FOLATE, FERRITIN, TIBC, IRON, RETICCTPCT in the last 72 hours. Urine analysis:    Component Value Date/Time   COLORURINE YELLOW 05/15/2020 2307   APPEARANCEUR CLEAR 05/15/2020 2307   LABSPEC 1.033 (H) 05/15/2020 2307   PHURINE 7.0 05/15/2020 [place] 05/15/2020 2307   HGBUR NEGATIVE 05/15/2020 2307   BILIRUBINUR NEGATIVE 05/15/2020 2307   KETONESUR NEGATIVE 05/15/2020  [place] 05/15/2020 2307   UROBILINOGEN 0.2 09/19/2014 1027   NITRITE NEGATIVE 05/15/2020 2307   LEUKOCYTESUR NEGATIVE 05/15/2020 2307   Recent Results (from the past 240 hour(s))  C Difficile Quick Screen w PCR reflex     Status: None   Collection Time: 08/08/20  4:30 PM   Specimen: STOOL  Result Value Ref Range Status   C Diff antigen NEGATIVE NEGATIVE Final   C Diff toxin NEGATIVE NEGATIVE Final   C Diff interpretation No C. difficile detected.  Final    Comment: Performed at [facility], [place] [address], [place], [place] [address]  Gastrointestinal Panel by PCR , Stool     Status: Abnormal   Collection Time: 08/08/20  4:30 PM   Specimen: STOOL  Result Value Ref Range Status   Campylobacter species NOT DETECTED NOT DETECTED Final   Plesimonas shigelloides NOT DETECTED NOT DETECTED Final   Salmonella species NOT DETECTED NOT DETECTED Final   Yersinia enterocolitica NOT DETECTED NOT DETECTED Final    Vibrio species NOT DETECTED NOT DETECTED Final   Vibrio cholerae NOT DETECTED NOT DETECTED Final   Enteroaggregative E coli (EAEC) NOT DETECTED NOT DETECTED Final   Enteropathogenic E coli (EPEC) DETECTED (A) NOT DETECTED Final    Comment: CRITICAL RESULT CALLED TO, READ BACK BY AND VERIFIED WITH: MELISSA HOUSE RN @1408  08/09/20 SCS    Enterotoxigenic E coli (ETEC) NOT DETECTED NOT DETECTED Final   Shiga like toxin producing E coli (STEC) NOT DETECTED NOT DETECTED Final   Shigella/Enteroinvasive E coli (EIEC) NOT DETECTED NOT DETECTED Final   Cryptosporidium NOT DETECTED NOT DETECTED Final   Cyclospora cayetanensis NOT DETECTED NOT DETECTED Final   Entamoeba histolytica NOT DETECTED NOT DETECTED Final   Giardia lamblia NOT DETECTED NOT DETECTED Final   Adenovirus F40/41 NOT DETECTED NOT DETECTED Final   Astrovirus NOT DETECTED NOT DETECTED Final   Norovirus GI/GII NOT DETECTED NOT DETECTED Final   Rotavirus A NOT DETECTED NOT DETECTED Final   Sapovirus (I, II, IV, and V) NOT DETECTED NOT DETECTED Final    Comment: Performed at [facility], [address] [place], [place] [address]  SARS CORONAVIRUS 2 (TAT 6-24 HRS) Nasopharyngeal Nasopharyngeal Swab     Status: None   Collection Time: 08/08/20  8:38 PM   Specimen: Nasopharyngeal Swab  Result Value Ref Range Status   SARS Coronavirus 2 NEGATIVE NEGATIVE Final    Comment: (NOTE) SARS-CoV-2 target nucleic acids are NOT DETECTED.  The SARS-CoV-2 RNA is generally detectable in upper and lower respiratory specimens during the acute phase of infection. Negative results do not preclude SARS-CoV-2 infection, do not rule out co-infections with other pathogens, and should not be used as the sole basis for treatment or other patient management decisions. Negative results must be combined with clinical observations, patient history, and epidemiological information. The expected result is Negative.  Fact Sheet for  Patients: SugarRoll.be  Fact Sheet for Healthcare Providers: https://www.woods-mathews.com/  This test is not yet approved or cleared by the [place] FDA and  has been authorized for detection and/or diagnosis of SARS-CoV-2 by FDA under an Emergency Use Authorization (EUA). This EUA will remain  in effect (meaning this test can be used) for the duration of the COVID-19 declaration under Se ction 564(b)(1) of the Act, 21 U.S.C. section 360bbb-3(b)(1), unless the authorization is terminated or revoked sooner.  Performed at [facility], [place] [address] [place], [place] [address]  Radiology Studies: No results found.  Scheduled Meds:  amLODipine  2.5 mg Oral QHS   vitamin C  1,000 mg Oral Daily   cholecalciferol  1,000 Units Oral Daily   diphenoxylate-atropine  1 tablet Oral QID   isosorbide mononitrate  15 mg Oral QHS   lactobacillus  1 g Oral TID WC   omega-3 acid ethyl esters  1 g Oral Daily   pantoprazole  40 mg Oral Daily   pravastatin  20 mg Oral QHS   vitamin B-12  500 mcg Oral Daily   Warfarin - Pharmacist Dosing Inpatient   Does not apply q1600   Zinc Oxide   Topical BID   Continuous Infusions:   LOS: 2 days   Time spent: 25 minutes.  Patrecia Pour, MD [facility] [email or web address] 08/11/2020, 5:29 PM

## 2020-08-11 NOTE — Progress Notes (Signed)
ANTICOAGULATION CONSULT NOTE - Follow Up Consult  Pharmacy Consult for warfarin Indication: hx atrial fibrillation, pulmonary embolus, and DVT  Allergies  Allergen Reactions   Darifenacin Nausea Only and Other (See Comments)    dry mouth and dizziness ENABLEX Dizziness Pt denies   Codeine Other (See Comments)   Sulfamethoxazole-Trimethoprim Other (See Comments)   Lisinopril Cough    Pt denies    Patient Measurements: Height: 5\' 8"  (172.7 cm) Weight: 92.2 kg (203 lb 3.2 oz) IBW/kg (Calculated) : 68.4 Heparin Dosing Weight:   Vital Signs: Temp: 98.1 F (36.7 C) (07/06 0419) Temp Source: Oral (07/06 0419) BP: 111/59 (07/06 0419) Pulse Rate: 65 (07/06 0419)  Labs: Recent Labs    08/09/20 0523 08/10/20 0433 08/11/20 0421  HGB 13.4 12.7* 12.4*  HCT 42.7 40.6 39.6  PLT 201 172 170  LABPROT 36.1* 33.5* 29.6*  INR 3.6* 3.3* 2.8*  CREATININE 1.41* 1.36* 1.40*     Estimated Creatinine Clearance: 41 mL/min (A) (by C-G formula based on SCr of 1.4 mg/dL (H)).   Medications:  PTA warfarin regimen: 2.5mg  daily except 1.25mg  on Wed and Sat  Assessment: Patient is an 87 y.o M with hx DVT/PE (s/p IVC filter) and afib on warfarin PTA, presented to the ED on 7/3 with c/o diarrhea and vomiting.  Warfarin resumed on admission.  Today, 08/11/2020: INR finally therapeutic again after no warfarin given the last 4 days  Oral intake improving per charting - ~50% meals No reported bleeding CBC stable SCr 1.4 fairly stable   Goal of Therapy:  INR 2-3 Monitor platelets by anticoagulation protocol: Yes   Plan:  Warfarin 1mg  today Daily INR Monitor for s/sx bleeding  Adrian Saran, PharmD, BCPS Secure Chat if ?s 08/11/2020 10:06 AM

## 2020-08-12 ENCOUNTER — Telehealth: Payer: Self-pay

## 2020-08-12 DIAGNOSIS — N1831 Chronic kidney disease, stage 3a: Secondary | ICD-10-CM | POA: Diagnosis not present

## 2020-08-12 DIAGNOSIS — A09 Infectious gastroenteritis and colitis, unspecified: Secondary | ICD-10-CM | POA: Diagnosis not present

## 2020-08-12 DIAGNOSIS — Z950 Presence of cardiac pacemaker: Secondary | ICD-10-CM | POA: Diagnosis not present

## 2020-08-12 DIAGNOSIS — I2510 Atherosclerotic heart disease of native coronary artery without angina pectoris: Secondary | ICD-10-CM | POA: Diagnosis not present

## 2020-08-12 LAB — BASIC METABOLIC PANEL
Anion gap: 7 (ref 5–15)
BUN: 21 mg/dL (ref 8–23)
CO2: 21 mmol/L — ABNORMAL LOW (ref 22–32)
Calcium: 9.9 mg/dL (ref 8.9–10.3)
Chloride: 114 mmol/L — ABNORMAL HIGH (ref 98–111)
Creatinine, Ser: 1.33 mg/dL — ABNORMAL HIGH (ref 0.61–1.24)
GFR, Estimated: 52 mL/min — ABNORMAL LOW (ref >60)
Glucose, Bld: 148 mg/dL — ABNORMAL HIGH (ref 70–99)
Potassium: 4.7 mmol/L (ref 3.5–5.1)
Sodium: 142 mmol/L (ref 135–145)

## 2020-08-12 LAB — MAGNESIUM: Magnesium: 1.7 mg/dL (ref 1.7–2.4)

## 2020-08-12 LAB — PROTIME-INR
INR: 2.7 — ABNORMAL HIGH (ref 0.8–1.2)
Prothrombin Time: 28.5 seconds — ABNORMAL HIGH (ref 11.4–15.2)

## 2020-08-12 MED ORDER — SODIUM CHLORIDE 0.9 % IV SOLN
INTRAVENOUS | Status: DC | PRN
  Administered 2020-08-12: 250 mL via INTRAVENOUS

## 2020-08-12 MED ORDER — CHOLESTYRAMINE 4 G PO PACK
4.0000 g | PACK | Freq: Every day | ORAL | Status: DC
  Filled 2020-08-12: qty 1

## 2020-08-12 MED ORDER — ZINC OXIDE 12.8 % EX OINT
TOPICAL_OINTMENT | Freq: Four times a day (QID) | CUTANEOUS | Status: DC
  Administered 2020-08-12 – 2020-08-23 (×4): 1 via TOPICAL
  Filled 2020-08-12 (×7): qty 56.7

## 2020-08-12 MED ORDER — CHOLESTYRAMINE 4 G PO PACK: 4.0000 g | PACK | Freq: Two times a day (BID) | ORAL | Status: DC

## 2020-08-12 MED ORDER — SODIUM CHLORIDE 0.9 % IV SOLN
1.0000 mg/kg | Freq: Two times a day (BID) | INTRAVENOUS | Status: DC
  Administered 2020-08-12 – 2020-08-17 (×9): 93 mg via INTRAVENOUS
  Filled 2020-08-12 (×10): qty 9.3
  Filled 2020-08-12: qty 5
  Filled 2020-08-12: qty 9.3

## 2020-08-12 MED ORDER — CHOLESTYRAMINE LIGHT 4 G PO PACK
4.0000 g | PACK | ORAL | Status: DC
  Administered 2020-08-12 – 2020-08-23 (×11): 4 g via ORAL
  Filled 2020-08-12 (×12): qty 1

## 2020-08-12 MED ORDER — WARFARIN SODIUM 2 MG PO TABS
2.0000 mg | ORAL_TABLET | Freq: Once | ORAL | Status: AC
  Administered 2020-08-12: 2 mg via ORAL
  Filled 2020-08-12: qty 1

## 2020-08-12 MED ORDER — NYSTATIN 100000 UNIT/GM EX POWD
Freq: Three times a day (TID) | CUTANEOUS | Status: DC
  Administered 2020-08-23: 1 via TOPICAL
  Filled 2020-08-12 (×4): qty 15

## 2020-08-12 NOTE — Progress Notes (Addendum)
VAST consulted to obtain new IV access as his other IV reportedly "slid out". Currently no IV meds/fluids/studies ordered other than PRN Dilaudid which patient has received once. He also has PO route pain meds. Educated unit RN and Dr. Bonner Puna via SecureChat about vessel preservation and reducing infection risk by not placing "just in case" IV's. Awaiting response for permission to leave patient without IV access at this time. Dr. Bonner Puna replied he is agreement with leaving IV access out at this time.

## 2020-08-12 NOTE — Plan of Care (Signed)

## 2020-08-12 NOTE — Plan of Care (Signed)
?  Problem: Nutrition: ?Goal: Adequate nutrition will be maintained ?Outcome: Progressing ?  ?Problem: Coping: ?Goal: Level of anxiety will decrease ?Outcome: Progressing ?  ?Problem: Elimination: ?Goal: Will not experience complications related to bowel motility ?Outcome: Progressing ?Goal: Will not experience complications related to urinary retention ?Outcome: Progressing ?  ?Problem: Pain Managment: ?Goal: General experience of comfort will improve ?Outcome: Progressing ?  ?Problem: Safety: ?Goal: Ability to remain free from injury will improve ?Outcome: Progressing ?  ?

## 2020-08-12 NOTE — Progress Notes (Signed)
PT Cancellation Note  Patient Details Name: [name] MRN: [id number] DOB: Aug 17, 1931   Cancelled Treatment:    Reason Eval/Treat Not Completed: Medical issues which prohibited therapy, per wife, continues to have diarrhea. On bedpan. Unable to mobilize at this time.  Patient may benefit from low air loss replacement mattress   due to excoriated buttocks and surrounding areas.   Claretha Cooper 08/12/2020, 10:52 AM  [place] Pager [phone] Office [phone]

## 2020-08-12 NOTE — Progress Notes (Signed)
ANTICOAGULATION CONSULT NOTE - Follow Up Consult  Pharmacy Consult for warfarin Indication: hx atrial fibrillation, pulmonary embolus, and DVT  Allergies  Allergen Reactions   Darifenacin Nausea Only and Other (See Comments)    dry mouth and dizziness ENABLEX Dizziness Pt denies   Codeine Other (See Comments)   Sulfamethoxazole-Trimethoprim Other (See Comments)   Lisinopril Cough    Pt denies    Patient Measurements: Height: 5\' 8"  (172.7 cm) Weight: 93 kg (205 lb 0.4 oz) IBW/kg (Calculated) : 68.4 Heparin Dosing Weight:   Vital Signs: Temp: 97.5 F (36.4 C) (07/07 0512) BP: 123/67 (07/07 0512) Pulse Rate: 54 (07/07 0512)  Labs: Recent Labs    08/10/20 0433 08/11/20 0421 08/12/20 0444  HGB 12.7* 12.4*  --   HCT 40.6 39.6  --   PLT 172 170  --   LABPROT 33.5* 29.6* 28.5*  INR 3.3* 2.8* 2.7*  CREATININE 1.36* 1.40* 1.33*     Estimated Creatinine Clearance: 43.3 mL/min (A) (by C-G formula based on SCr of 1.33 mg/dL (H)).   Medications:  PTA warfarin regimen: 2.5mg  daily except 1.25mg  on Wed and Sat  Assessment: Patient is an 87 y.o M with hx DVT/PE (s/p IVC filter) and afib on warfarin PTA, presented to the ED on 7/3 with c/o diarrhea and vomiting.  Warfarin resumed on admission.  Today, 08/12/2020: - INR is therapeutic at 2.7  - cbc stable - no signif drug-drug intxns   Goal of Therapy:  INR 2-3 Monitor platelets by anticoagulation protocol: Yes   Plan:  - warfarin 2 mg PO x1 - daily INR - monitor for s/sx bleeding  Dia Sitter P 08/12/2020,11:42 AM

## 2020-08-12 NOTE — Care Management Important Message (Signed)
Important Message  Patient Details IM Letter given to the Patient. Name: [name] MRN: [id number] Date of Birth: 07-Jan-1932   Medicare Important Message Given:  Yes     Kerin Salen 08/12/2020, 8:57 AM

## 2020-08-12 NOTE — Progress Notes (Signed)
[facility] for Infectious Disease    Date of Admission:  08/08/2020   Total days of antibiotics 4   ID: [name] is a 87 y.o. male with  hx of esbl ecoli renal abscess and EPEC ecoli Principal Problem:   Diarrhea Active Problems:   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)   Long term (current) use of anticoagulants   CAD (coronary artery disease), native coronary artery   Persistent atrial fibrillation (HCC)   Cardiac pacemaker in situ   CKD (chronic kidney disease), stage III (HCC)   S/P TAVR (transcatheter aortic valve replacement)   OSA on CPAP   Presence of IVC filter   Renal abscess   Infectious diarrhea   ESBL (extended spectrum beta-lactamase) producing bacteria infection    Subjective: Feeling better but still loose stools. Did not have any diarrhea overnight from 10p to 6am. But then had significant diarrhea in the morning. Still less than prior day  Medications:   amLODipine  2.5 mg Oral QHS   vitamin C  1,000 mg Oral Daily   cholecalciferol  1,000 Units Oral Daily   cholestyramine light  4 g Oral Q24H   diphenoxylate-atropine  1 tablet Oral QID   isosorbide mononitrate  15 mg Oral QHS   lactobacillus  1 g Oral TID WC   nystatin   Topical TID   omega-3 acid ethyl esters  1 g Oral Daily   pantoprazole  40 mg Oral Daily   pravastatin  20 mg Oral QHS   vitamin B-12  500 mcg Oral Daily   warfarin  2 mg Oral ONCE-1600   Warfarin - Pharmacist Dosing Inpatient   Does not apply q1600   Zinc Oxide   Topical QID    Objective: Vital signs in last 24 hours: Temp:  [97.5 F (36.4 C)-97.7 F (36.5 C)] 97.7 F (36.5 C) (07/07 1313) Pulse Rate:  [54-65] 65 (07/07 1313) Resp:  [17-18] 17 (07/07 1313) BP: (102-127)/(65-70) 102/65 (07/07 1313) SpO2:  [89 %-100 %] 89 % (07/07 1313) Weight:  [93 kg] 93 kg (07/07 0500) Physical Exam  Constitutional: He is oriented to person, place, and time. He appears well-developed and  well-nourished. No distress.  HENT:  Mouth/Throat: Oropharynx is clear and moist. No oropharyngeal exudate.  Cardiovascular: Normal rate, regular rhythm and normal heart sounds. Exam reveals no gallop and no friction rub.  No murmur heard.  Pulmonary/Chest: Effort normal and breath sounds normal. No respiratory distress. He has no wheezes.  Abdominal: Soft. Bowel sounds are normal. He exhibits no distension. There is no tenderness.  Lymphadenopathy:  He has no cervical adenopathy.  Neurological: He is alert and oriented to person, place, and time.  Skin: Skin is warm and dry. No rash noted. No erythema.  Psychiatric: He has a normal mood and affect. His behavior is normal.    Lab Results Recent Labs    08/10/20 0433 08/11/20 0421 08/12/20 0444  WBC 12.3* 12.9*  --   HGB 12.7* 12.4*  --   HCT 40.6 39.6  --   NA 138 138 142  K 3.6 4.2 4.7  CL 109 110 114*  CO2 22 22 21*  BUN 20 19 21   CREATININE 1.36* 1.40* 1.33*   Liver Panel No results for input(s): PROT, ALBUMIN, AST, ALT, ALKPHOS, BILITOT, BILIDIR, IBILI in the last 72 hours. Sedimentation Rate No results for input(s): ESRSEDRATE in the last 72 hours. C-Reactive Protein No results for input(s):  CRP in the last 72 hours.  Microbiology: reviewed Studies/Results: No results found.   Assessment/Plan: EPEC diarrhea = completed 3 days of azithromycin. Will continue with supportive care. Will give a dose of cholestyramin to see if that will help his symptoms  Esbl ecoli renal abscess = will start eravacycline to re-initiate treatment.   [facility] for Infectious Diseases Cell: [phone] Pager: [phone]  08/12/2020, 3:04 PM

## 2020-08-12 NOTE — Progress Notes (Signed)
PROGRESS NOTE  [name]  PYP:[id number] DOB: 04-08-1931 DOA: 08/08/2020 PCP: Shirline Frees, MD  Outpatient Specialists: [facility] Brief Narrative: 87 y.o. male with medical history significant for CAD, biventricular cardiac pacemaker, s/p TAVR, persistent atrial fibrillation on Coumadin, combined systolic and diastolic HF, HLD, HTN, OSA on CPAP, hx of DVT/PE s/p IVC, hx of prostate cancer s/p radical prostatectomy, recent right renal subcapsular abscess secondary to ESBL in the setting of nonobstructing nephrolithiasis (followed by urology and ID as an outpatient) treated with 12 weeks of IV ertapenem which got completed on 08/08/2020, recently started on oral vancomycin from 08/06/2020 by ID for positive C. difficile diarrhea presented with worsening diarrhea along with vomiting.  On presentation, he was afebrile and normotensive with no leukocytosis.  C. difficile panel was negative.  Assessment & Plan: Principal Problem:   Diarrhea Active Problems:   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)   Long term (current) use of anticoagulants   CAD (coronary artery disease), native coronary artery   Persistent atrial fibrillation (HCC)   Cardiac pacemaker in situ   CKD (chronic kidney disease), stage III (HCC)   S/P TAVR (transcatheter aortic valve replacement)   OSA on CPAP   Presence of IVC filter   Renal abscess   Infectious diarrhea   ESBL (extended spectrum beta-lactamase) producing bacteria infection  Enteropathogenic E. coli with severe and persistent diarrhea: C. diff negative.  - Due to severity of symptoms and old age/risk of worsening, azithromycin is being administered.  - Continue lomotil. Add cholestyramine.  - Needs barrier cream applied liberally and often.   MASD, candidiasis:  - Nystatin powder to be applied liberally, attempt to keep areas dry.   ESBL E. coli renal abscess: Remains on repeat imaging. Completed 12 weeks of IV ertapenem on  08/08/2020. - Plan to start omadacycline now per ID  - Urology follow up planned   Remote history of PE/DVT Persistent atrial fibrillation History of bioprosthetic TAVR Supratherapeutic INR - Continue coumadin per pharmacy, daily INR checks.    Hypomagnesemia: Improved.  - Monitor   Heart block s/p pacemaker, CAD s/p PCI:  - Continue imdur, statin, coumadin - Outpatient follow-up with cardiology  Chronic kidney disease stage IIIa: At baseline.  - Avoid nephrotoxins   Leukocytosis - Monitor  Hypertension - Continue amlodipine and imdur  Hyperlipidemia - Continue statin  OSA - Continue CPAP at bedtime  Dermatitis of the buttock region from diffuse diarrhea - Continue topical zinc oxide.  Wound care consultation.   Generalized deconditioning - PT eval  Obesity: Estimated body mass index is 31.17 kg/m as calculated from the following:   Height as of this encounter: 5\' 8"  (1.727 m).   Weight as of this encounter: 93 kg.  DVT prophylaxis: Coumadin Code Status: Full Family Communication: Wife at bedside Disposition Plan:  Status is: Inpatient  Remains inpatient appropriate because:Inpatient level of care appropriate due to severity of illness  Dispo: The patient is from: Home              Anticipated d/c is to: Home              Patient currently is not medically stable to d/c.   Difficult to place patient No  Consultants:  ID  Procedures:  Rectal tube  Antimicrobials: Azithromycin   Subjective: Discouraged by return of frequent stools that he can't control this morning after an evening without them. No abd pain. The pain with any touching of the  perianal, perineal, scrotal area is severe. Starting to have some itching in upper medial thigh areas.   Objective: Vitals:   08/11/20 2130 08/12/20 0500 08/12/20 0512 08/12/20 1313  BP: 127/70  123/67 102/65  Pulse: 61  (!) 54 65  Resp: 18  18 17   Temp: 97.7 F (36.5 C)  (!) 97.5 F (36.4 C) 97.7 F (36.5  C)  TempSrc: Oral   Oral  SpO2: 100%  99% (!) 89%  Weight:  93 kg    Height:        Intake/Output Summary (Last 24 hours) at 08/12/2020 1604 Last data filed at 08/12/2020 1400 Gross per 24 hour  Intake 240 ml  Output --  Net 240 ml   Filed Weights   08/08/20 1339 08/11/20 0500 08/12/20 0500  Weight: 93.4 kg 92.2 kg 93 kg   Gen: 87 y.o. male in no distress Pulm: Nonlabored breathing room air. Clear. CV: Regular rate and rhythm. No murmur, rub, or gallop. No JVD, no pitting dependent edema. GI: Abdomen soft, non-tender, non-distended, with normoactive bowel sounds.  Ext: Warm, no deformities Skin: Bright irritated erythema extending perianally without ulceration or fissure. Tender erythema enveloping scrotum and penis extending with satellite lesions to medial thighs also noted with grossly wet skin throughout.  Neuro: Alert and oriented. HOH. No focal neurological deficits. Psych: Judgement and insight appear fair. Mood euthymic & affect congruent. Behavior is appropriate.    Data Reviewed: I have personally reviewed following labs and imaging studies  CBC: Recent Labs  Lab 08/08/20 1443 08/09/20 0523 08/10/20 0433 08/11/20 0421  WBC 10.2 10.6* 12.3* 12.9*  NEUTROABS 6.8  --  6.1 6.3  HGB 14.8 13.4 12.7* 12.4*  HCT 47.9 42.7 40.6 39.6  MCV 83.3 82.6 82.9 82.5  PLT 219 201 172 250   Basic Metabolic Panel: Recent Labs  Lab 08/08/20 1443 08/09/20 0523 08/10/20 0433 08/11/20 0421 08/12/20 0444  NA 140 138 138 138 142  K 4.1 4.1 3.6 4.2 4.7  CL 110 109 109 110 114*  CO2 20* 22 22 22  21*  GLUCOSE 132* 111* 113* 108* 148*  BUN 20 19 20 19 21   CREATININE 1.25* 1.41* 1.36* 1.40* 1.33*  CALCIUM 9.5 9.4 9.4 9.3 9.9  MG  --   --  1.4* 1.7 1.7   GFR: Estimated Creatinine Clearance: 43.3 mL/min (A) (by C-G formula based on SCr of 1.33 mg/dL (H)). Liver Function Tests: Recent Labs  Lab 08/08/20 1443  AST 17  ALT 6  ALKPHOS 89  BILITOT 0.5  PROT 7.3  ALBUMIN 3.3*    No results for input(s): LIPASE, AMYLASE in the last 168 hours. No results for input(s): AMMONIA in the last 168 hours. Coagulation Profile: Recent Labs  Lab 08/08/20 1443 08/09/20 0523 08/10/20 0433 08/11/20 0421 08/12/20 0444  INR 3.3* 3.6* 3.3* 2.8* 2.7*   Cardiac Enzymes: No results for input(s): CKTOTAL, CKMB, CKMBINDEX, TROPONINI in the last 168 hours. BNP (last 3 results) No results for input(s): PROBNP in the last 8760 hours. HbA1C: No results for input(s): HGBA1C in the last 72 hours. CBG: No results for input(s): GLUCAP in the last 168 hours. Lipid Profile: No results for input(s): CHOL, HDL, LDLCALC, TRIG, CHOLHDL, LDLDIRECT in the last 72 hours. Thyroid Function Tests: No results for input(s): TSH, T4TOTAL, FREET4, T3FREE, THYROIDAB in the last 72 hours. Anemia Panel: No results for input(s): VITAMINB12, FOLATE, FERRITIN, TIBC, IRON, RETICCTPCT in the last 72 hours. Urine analysis:    Component Value Date/Time  COLORURINE YELLOW 05/15/2020 2307   APPEARANCEUR CLEAR 05/15/2020 2307   LABSPEC 1.033 (H) 05/15/2020 2307   PHURINE 7.0 05/15/2020 2307   GLUCOSEU NEGATIVE 05/15/2020 2307   HGBUR NEGATIVE 05/15/2020 2307   BILIRUBINUR NEGATIVE 05/15/2020 2307   KETONESUR NEGATIVE 05/15/2020 2307   PROTEINUR NEGATIVE 05/15/2020 2307   UROBILINOGEN 0.2 09/19/2014 1027   NITRITE NEGATIVE 05/15/2020 2307   LEUKOCYTESUR NEGATIVE 05/15/2020 2307   Recent Results (from the past 240 hour(s))  C Difficile Quick Screen w PCR reflex     Status: None   Collection Time: 08/08/20  4:30 PM   Specimen: STOOL  Result Value Ref Range Status   C Diff antigen NEGATIVE NEGATIVE Final   C Diff toxin NEGATIVE NEGATIVE Final   C Diff interpretation No C. difficile detected.  Final    Comment: Performed at [facility], [place] [address], [place], [place] [address]  Gastrointestinal Panel by PCR , Stool     Status: Abnormal   Collection Time: 08/08/20  4:30 PM    Specimen: STOOL  Result Value Ref Range Status   Campylobacter species NOT DETECTED NOT DETECTED Final   Plesimonas shigelloides NOT DETECTED NOT DETECTED Final   Salmonella species NOT DETECTED NOT DETECTED Final   Yersinia enterocolitica NOT DETECTED NOT DETECTED Final   Vibrio species NOT DETECTED NOT DETECTED Final   Vibrio cholerae NOT DETECTED NOT DETECTED Final   Enteroaggregative E coli (EAEC) NOT DETECTED NOT DETECTED Final   Enteropathogenic E coli (EPEC) DETECTED (A) NOT DETECTED Final    Comment: CRITICAL RESULT CALLED TO, READ BACK BY AND VERIFIED WITH: MELISSA HOUSE RN @1408  08/09/20 SCS    Enterotoxigenic E coli (ETEC) NOT DETECTED NOT DETECTED Final   Shiga like toxin producing E coli (STEC) NOT DETECTED NOT DETECTED Final   Shigella/Enteroinvasive E coli (EIEC) NOT DETECTED NOT DETECTED Final   Cryptosporidium NOT DETECTED NOT DETECTED Final   Cyclospora cayetanensis NOT DETECTED NOT DETECTED Final   Entamoeba histolytica NOT DETECTED NOT DETECTED Final   Giardia lamblia NOT DETECTED NOT DETECTED Final   Adenovirus F40/41 NOT DETECTED NOT DETECTED Final   Astrovirus NOT DETECTED NOT DETECTED Final   Norovirus GI/GII NOT DETECTED NOT DETECTED Final   Rotavirus A NOT DETECTED NOT DETECTED Final   Sapovirus (I, II, IV, and V) NOT DETECTED NOT DETECTED Final    Comment: Performed at [facility], [address] [place], [place] [address]  SARS CORONAVIRUS 2 (TAT 6-24 HRS) Nasopharyngeal Nasopharyngeal Swab     Status: None   Collection Time: 08/08/20  8:38 PM   Specimen: Nasopharyngeal Swab  Result Value Ref Range Status   SARS Coronavirus 2 NEGATIVE NEGATIVE Final    Comment: (NOTE) SARS-CoV-2 target nucleic acids are NOT DETECTED.  The SARS-CoV-2 RNA is generally detectable in upper and lower respiratory specimens during the acute phase of infection. Negative results do not preclude SARS-CoV-2 infection, do not rule out co-infections with other pathogens,  and should not be used as the sole basis for treatment or other patient management decisions. Negative results must be combined with clinical observations, patient history, and epidemiological information. The expected result is Negative.  Fact Sheet for Patients: SugarRoll.be  Fact Sheet for Healthcare Providers: https://www.woods-mathews.com/  This test is not yet approved or cleared by the [place] FDA and  has been authorized for detection and/or diagnosis of SARS-CoV-2 by FDA under an Emergency Use Authorization (EUA). This EUA will remain  in effect (meaning this test  can be used) for the duration of the COVID-19 declaration under Se ction 564(b)(1) of the Act, 21 U.S.C. section 360bbb-3(b)(1), unless the authorization is terminated or revoked sooner.  Performed at [facility], [place] [address], [place], [place] [address]       Radiology Studies: No results found.  Scheduled Meds:  amLODipine  2.5 mg Oral QHS   vitamin C  1,000 mg Oral Daily   cholecalciferol  1,000 Units Oral Daily   cholestyramine light  4 g Oral Q24H   diphenoxylate-atropine  1 tablet Oral QID   isosorbide mononitrate  15 mg Oral QHS   lactobacillus  1 g Oral TID WC   nystatin   Topical TID   omega-3 acid ethyl esters  1 g Oral Daily   pantoprazole  40 mg Oral Daily   pravastatin  20 mg Oral QHS   vitamin B-12  500 mcg Oral Daily   warfarin  2 mg Oral ONCE-1600   Warfarin - Pharmacist Dosing Inpatient   Does not apply q1600   Zinc Oxide   Topical QID   Continuous Infusions:  eravacycline       LOS: 3 days   Time spent: 25 minutes.  Patrecia Pour, MD [facility] [email or web address] 08/12/2020, 4:04 PM

## 2020-08-12 NOTE — Telephone Encounter (Signed)
[facility] calling to get updated contact info as they have been unable to reach the patient. Provided them with wife's phone number as she has been assisting in coordinating care.   Beryle Flock, RN

## 2020-08-13 DIAGNOSIS — I2510 Atherosclerotic heart disease of native coronary artery without angina pectoris: Secondary | ICD-10-CM | POA: Diagnosis not present

## 2020-08-13 DIAGNOSIS — Z950 Presence of cardiac pacemaker: Secondary | ICD-10-CM | POA: Diagnosis not present

## 2020-08-13 DIAGNOSIS — A09 Infectious gastroenteritis and colitis, unspecified: Secondary | ICD-10-CM | POA: Diagnosis not present

## 2020-08-13 DIAGNOSIS — N1831 Chronic kidney disease, stage 3a: Secondary | ICD-10-CM | POA: Diagnosis not present

## 2020-08-13 LAB — PROTIME-INR
INR: 2.6 — ABNORMAL HIGH (ref 0.8–1.2)
Prothrombin Time: 27.5 seconds — ABNORMAL HIGH (ref 11.4–15.2)

## 2020-08-13 MED ORDER — WARFARIN SODIUM 2.5 MG PO TABS
2.5000 mg | ORAL_TABLET | Freq: Once | ORAL | Status: AC
  Administered 2020-08-13: 2.5 mg via ORAL
  Filled 2020-08-13: qty 1

## 2020-08-13 MED ORDER — ONDANSETRON HCL 4 MG/2ML IJ SOLN
4.0000 mg | Freq: Once | INTRAMUSCULAR | Status: AC
  Administered 2020-08-13: 4 mg via INTRAVENOUS
  Filled 2020-08-13: qty 2

## 2020-08-13 MED ORDER — DEXTROSE-NACL 5-0.45 % IV SOLN: INTRAVENOUS | Status: DC

## 2020-08-13 MED ORDER — CHLORHEXIDINE GLUCONATE CLOTH 2 % EX PADS
6.0000 | MEDICATED_PAD | Freq: Every day | CUTANEOUS | Status: DC
  Administered 2020-08-14 – 2020-08-24 (×12): 6 via TOPICAL

## 2020-08-13 MED ORDER — SODIUM CHLORIDE 0.9 % IV SOLN
500.0000 mg | Freq: Every day | INTRAVENOUS | Status: DC
  Administered 2020-08-13 – 2020-08-16 (×3): 500 mg via INTRAVENOUS
  Filled 2020-08-13 (×4): qty 500

## 2020-08-13 MED ORDER — HYDROMORPHONE HCL 1 MG/ML IJ SOLN
0.5000 mg | INTRAMUSCULAR | Status: DC | PRN
  Administered 2020-08-13 – 2020-08-16 (×6): 1 mg via INTRAVENOUS
  Filled 2020-08-13 (×6): qty 1

## 2020-08-13 MED ORDER — ONDANSETRON HCL 4 MG/2ML IJ SOLN
4.0000 mg | Freq: Four times a day (QID) | INTRAMUSCULAR | Status: DC | PRN
  Administered 2020-08-13 – 2020-08-14 (×3): 4 mg via INTRAVENOUS
  Filled 2020-08-13 (×3): qty 2

## 2020-08-13 NOTE — Progress Notes (Signed)
ANTICOAGULATION CONSULT NOTE - Follow Up Consult  Pharmacy Consult for warfarin Indication: hx atrial fibrillation, pulmonary embolus, and DVT  Allergies  Allergen Reactions   Darifenacin Nausea Only and Other (See Comments)    dry mouth and dizziness ENABLEX Dizziness Pt denies   Codeine Other (See Comments)   Sulfamethoxazole-Trimethoprim Other (See Comments)   Lisinopril Cough    Pt denies    Patient Measurements: Height: 5\' 8"  (172.7 cm) Weight: 91.4 kg (201 lb 8 oz) IBW/kg (Calculated) : 68.4 Heparin Dosing Weight:   Vital Signs: Temp: 97.9 F (36.6 C) (07/08 0429) BP: 132/75 (07/08 0429) Pulse Rate: 63 (07/08 0429)  Labs: Recent Labs    08/11/20 0421 08/12/20 0444 08/13/20 0509  HGB 12.4*  --   --   HCT 39.6  --   --   PLT 170  --   --   LABPROT 29.6* 28.5* 27.5*  INR 2.8* 2.7* 2.6*  CREATININE 1.40* 1.33*  --      Estimated Creatinine Clearance: 42.9 mL/min (A) (by C-G formula based on SCr of 1.33 mg/dL (H)).   Medications:  PTA warfarin regimen: 2.5mg  daily except 1.25mg  on Wed and Sat  Assessment: Patient is an 87 y.o M with hx DVT/PE (s/p IVC filter) and afib on warfarin PTA, presented to the ED on 7/3 with c/o diarrhea and vomiting.  Warfarin resumed on admission.  Today, 08/13/2020: - INR is therapeutic at 2.6, trending down - cbc stable (last 7/6) - on eravacycline but also questran   Goal of Therapy:  INR 2-3 Monitor platelets by anticoagulation protocol: Yes   Plan:  - warfarin 2.5 mg PO x1 - daily INR - monitor for s/sx bleeding  Ulice Dash D 08/13/2020,10:49 AM

## 2020-08-13 NOTE — Progress Notes (Signed)
[facility]    Date of Admission:  08/08/2020   Total days of antibiotics 4   ID: [name] is a 87 y.o. male with esbl renal abscess and epec diarrhea Principal Problem:   Diarrhea Active Problems:   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)   Long term (current) use of anticoagulants   CAD (coronary artery disease), native coronary artery   Persistent atrial fibrillation (HCC)   Cardiac pacemaker in situ   CKD (chronic kidney disease), stage III (HCC)   S/P TAVR (transcatheter aortic valve replacement)   OSA on CPAP   Presence of IVC filter   Renal abscess   Infectious diarrhea   ESBL (extended spectrum beta-lactamase) producing bacteria infection    Subjective: Today worse than yesterday, having profuse watery diarrhea as well as episode of vomiting this morning  Medications:   amLODipine  2.5 mg Oral QHS   vitamin C  1,000 mg Oral Daily   cholecalciferol  1,000 Units Oral Daily   cholestyramine light  4 g Oral Q24H   diphenoxylate-atropine  1 tablet Oral QID   isosorbide mononitrate  15 mg Oral QHS   lactobacillus  1 g Oral TID WC   nystatin   Topical TID   omega-3 acid ethyl esters  1 g Oral Daily   pantoprazole  40 mg Oral Daily   pravastatin  20 mg Oral QHS   vitamin B-12  500 mcg Oral Daily   Warfarin - Pharmacist Dosing Inpatient   Does not apply q1600   Zinc Oxide   Topical QID    Objective: Vital signs in last 24 hours: Temp:  [97.9 F (36.6 C)-98.2 F (36.8 C)] 98 F (36.7 C) (07/08 1336) Pulse Rate:  [63] 63 (07/08 1336) Resp:  [17-18] 17 (07/08 1336) BP: (122-139)/(68-75) 122/68 (07/08 1336) SpO2:  [98 %] 98 % (07/08 1336) Weight:  [91.4 kg] 91.4 kg (07/08 0440)  Physical Exam  Constitutional: He is oriented to person, place, and time. He appears well-developed and well-nourished. No distress.  HENT:  Mouth/Throat: Oropharynx is clear and moist. No oropharyngeal exudate.   Cardiovascular: Normal rate, regular rhythm and normal heart sounds. Exam reveals no gallop and no friction rub.  No murmur heard.  Pulmonary/Chest: Effort normal and breath sounds normal. No respiratory distress. He has no wheezes.  Abdominal: Soft. Bowel sounds are normal. He exhibits no distension. There is no tenderness.  Lymphadenopathy:  He has no cervical adenopathy.  Neurological: He is alert and oriented to person, place, and time.  Skin: Skin is warm and dry. No rash noted. No erythema.  Psychiatric: He has a normal mood and affect. His behavior is normal.    Lab Results Recent Labs    08/11/20 0421 08/12/20 0444  WBC 12.9*  --   HGB 12.4*  --   HCT 39.6  --   NA 138 142  K 4.2 4.7  CL 110 114*  CO2 22 21*  BUN 19 21  CREATININE 1.40* 1.33*   Liver Panel No results for input(s): PROT, ALBUMIN, AST, ALT, ALKPHOS, BILITOT, BILIDIR, IBILI in the last 72 hours. Sedimentation Rate No results for input(s): ESRSEDRATE in the last 72 hours. C-Reactive Protein No results for input(s): CRP in the last 72 hours.  Microbiology: reviewed Studies/Results: No results found.   Assessment/Plan: Epec diarrhea = will give 2 more days of azithromycin which would be a 5 day course of treatment. Continue  with supportive care. Agree with plan to give rectal tube and foley catheter   Skin maceration/dermatitis =from moisture/acidity of stool. Continue with barrier cream  Esbl renal abscess = continue with eravacycline tonite, unclear if this is causing him nausea  Nausea with vomiting = continue with anti-emetic   [facility] Cell: [phone] Pager: [phone]  08/13/2020, 6:15 PM

## 2020-08-13 NOTE — Progress Notes (Signed)
PROGRESS NOTE  [name]  HAF:[id number] DOB: 15-Feb-1931 DOA: 08/08/2020 PCP: Shirline Frees, MD  Outpatient Specialists: [facility] Brief Narrative: 87 y.o. male with medical history significant for CAD, biventricular cardiac pacemaker, s/p TAVR, persistent atrial fibrillation on Coumadin, combined systolic and diastolic HF, HLD, HTN, OSA on CPAP, hx of DVT/PE s/p IVC, hx of prostate cancer s/p radical prostatectomy, recent right renal subcapsular abscess secondary to ESBL in the setting of nonobstructing nephrolithiasis (followed by urology and ID as an outpatient) treated with 12 weeks of IV ertapenem (completed 08/08/2020), recently started on oral vancomycin from 08/06/2020 by ID for C. difficile diarrhea presented with worsening diarrhea along with vomiting.  On presentation, he was afebrile and normotensive with no leukocytosis. Enteropathogenic E. coli was found in stool for which azithromycin has been given. Antimotility agents and cholestyramine added. Rectal tube was required.   Assessment & Plan: Principal Problem:   Diarrhea Active Problems:   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)   Long term (current) use of anticoagulants   CAD (coronary artery disease), native coronary artery   Persistent atrial fibrillation (HCC)   Cardiac pacemaker in situ   CKD (chronic kidney disease), stage III (HCC)   S/P TAVR (transcatheter aortic valve replacement)   OSA on CPAP   Presence of IVC filter   Renal abscess   Infectious diarrhea   ESBL (extended spectrum beta-lactamase) producing bacteria infection  Enteropathogenic E. coli with severe and persistent diarrhea: C. diff negative.  - Due to severity of symptoms and old age/risk of worsening, azithromycin was given and will be restarted. - Continue lomotil. Added cholestyramine.  - Needs barrier cream applied liberally and often. I have offered reinsertion of rectal tube to pt/wife. They will consider. We would  need to premedicate with IV dilaudid for this to be tolerable but may help with MASD.   MASD, candidiasis:  - Nystatin powder to be applied liberally, attempt to keep areas dry. - WOC consulted for additional recommendations.    ESBL E. coli renal abscess: Remains on repeat imaging. Completed 12 weeks of IV ertapenem on 08/08/2020. - Restart abx with eravacycline per ID. Will defer decision Re: continuation or alternative Tx to ID. With initiation, the patient has developed significant vomiting, though abdomen is benign. D/w Dr. Baxter Flattery. - Urology follow up planned   Remote history of PE/DVT Persistent atrial fibrillation History of bioprosthetic TAVR Supratherapeutic INR - Continue coumadin per pharmacy, daily INR checks.    Hypomagnesemia: Improved.  - Monitor   Heart block s/p pacemaker, CAD s/p PCI:  - Continue imdur, statin, coumadin - Outpatient follow-up with cardiology  Chronic kidney disease stage IIIa: Near baseline.  - Avoid nephrotoxins - With significant emesis, he is not be able to maintain hydration with such GI losses. We will restart low volume IVF.   Leukocytosis - Monitor  Hypertension - Continue amlodipine and imdur  Hyperlipidemia - Continue statin  OSA - Continue CPAP at bedtime  Dermatitis of the buttock region from diffuse diarrhea - Continue topical zinc oxide. Wound care consultation.   Generalized deconditioning - PT eval  Obesity: Estimated body mass index is 30.64 kg/m as calculated from the following:   Height as of this encounter: 5\' 8"  (1.727 m).   Weight as of this encounter: 91.4 kg.  DVT prophylaxis: Coumadin Code Status: Full Family Communication: Wife at bedside on 2nd rounds Disposition Plan:  Status is: Inpatient  Remains inpatient appropriate because:Inpatient level of care appropriate due  to severity of illness  Dispo: The patient is from: Home              Anticipated d/c is to: Home              Patient currently is  not medically stable to d/c.   Difficult to place patient No  Consultants:  ID  Procedures:  Rectal tube  Antimicrobials: Azithromycin  Eravacycline  Subjective: Developed significant vomiting this morning that has persisted into the afternoon with any attempt at po intake. No abdominal pain. Rash on scrotum is improved though stools remain frequent and now a bit more watery.   Objective: Vitals:   08/12/20 2056 08/13/20 0429 08/13/20 0440 08/13/20 1336  BP: 139/72 132/75  122/68  Pulse: 63 63  63  Resp: 18 18  17   Temp: 98.2 F (36.8 C) 97.9 F (36.6 C)  98 F (36.7 C)  TempSrc: Oral   Oral  SpO2: 98% 98%  98%  Weight:   91.4 kg   Height:        Intake/Output Summary (Last 24 hours) at 08/13/2020 1432 Last data filed at 08/13/2020 1111 Gross per 24 hour  Intake 1082.84 ml  Output 1 ml  Net 1081.84 ml   Filed Weights   08/11/20 0500 08/12/20 0500 08/13/20 0440  Weight: 92.2 kg 93 kg 91.4 kg   Gen: 87 y.o. male in no distress Pulm: Nonlabored breathing room air. Clear. CV: Regular rate and rhythm. No murmur, rub, or gallop. No JVD, no pitting dependent edema. GI: Abdomen soft without significant tenderness or distention. +BS.  Ext: Warm, no deformities Skin: Very slightly improved but still severe erythema in intercrural folds and scrotum. Very bad MASD on buttocks medially. No new rashes, lesions or ulcers on visualized skin. Neuro: Alert and oriented. No focal neurological deficits. Psych: Judgement and insight appear fair. Mood euthymic & affect congruent. Behavior is appropriate.    Data Reviewed: I have personally reviewed following labs and imaging studies  CBC: Recent Labs  Lab 08/08/20 1443 08/09/20 0523 08/10/20 0433 08/11/20 0421  WBC 10.2 10.6* 12.3* 12.9*  NEUTROABS 6.8  --  6.1 6.3  HGB 14.8 13.4 12.7* 12.4*  HCT 47.9 42.7 40.6 39.6  MCV 83.3 82.6 82.9 82.5  PLT 219 201 172 413   Basic Metabolic Panel: Recent Labs  Lab 08/08/20 1443  08/09/20 0523 08/10/20 0433 08/11/20 0421 08/12/20 0444  NA 140 138 138 138 142  K 4.1 4.1 3.6 4.2 4.7  CL 110 109 109 110 114*  CO2 20* 22 22 22  21*  GLUCOSE 132* 111* 113* 108* 148*  BUN 20 19 20 19 21   CREATININE 1.25* 1.41* 1.36* 1.40* 1.33*  CALCIUM 9.5 9.4 9.4 9.3 9.9  MG  --   --  1.4* 1.7 1.7   GFR: Estimated Creatinine Clearance: 42.9 mL/min (A) (by C-G formula based on SCr of 1.33 mg/dL (H)). Liver Function Tests: Recent Labs  Lab 08/08/20 1443  AST 17  ALT 6  ALKPHOS 89  BILITOT 0.5  PROT 7.3  ALBUMIN 3.3*   No results for input(s): LIPASE, AMYLASE in the last 168 hours. No results for input(s): AMMONIA in the last 168 hours. Coagulation Profile: Recent Labs  Lab 08/09/20 0523 08/10/20 0433 08/11/20 0421 08/12/20 0444 08/13/20 0509  INR 3.6* 3.3* 2.8* 2.7* 2.6*   Cardiac Enzymes: No results for input(s): CKTOTAL, CKMB, CKMBINDEX, TROPONINI in the last 168 hours. BNP (last 3 results) No results for input(s): PROBNP  in the last 8760 hours. HbA1C: No results for input(s): HGBA1C in the last 72 hours. CBG: No results for input(s): GLUCAP in the last 168 hours. Lipid Profile: No results for input(s): CHOL, HDL, LDLCALC, TRIG, CHOLHDL, LDLDIRECT in the last 72 hours. Thyroid Function Tests: No results for input(s): TSH, T4TOTAL, FREET4, T3FREE, THYROIDAB in the last 72 hours. Anemia Panel: No results for input(s): VITAMINB12, FOLATE, FERRITIN, TIBC, IRON, RETICCTPCT in the last 72 hours. Urine analysis:    Component Value Date/Time   COLORURINE YELLOW 05/15/2020 2307   APPEARANCEUR CLEAR 05/15/2020 2307   LABSPEC 1.033 (H) 05/15/2020 2307   PHURINE 7.0 05/15/2020 2307   GLUCOSEU NEGATIVE 05/15/2020 2307   HGBUR NEGATIVE 05/15/2020 2307   BILIRUBINUR NEGATIVE 05/15/2020 2307   KETONESUR NEGATIVE 05/15/2020 2307   PROTEINUR NEGATIVE 05/15/2020 2307   UROBILINOGEN 0.2 09/19/2014 1027   NITRITE NEGATIVE 05/15/2020 2307   LEUKOCYTESUR NEGATIVE  05/15/2020 2307   Recent Results (from the past 240 hour(s))  C Difficile Quick Screen w PCR reflex     Status: None   Collection Time: 08/08/20  4:30 PM   Specimen: STOOL  Result Value Ref Range Status   C Diff antigen NEGATIVE NEGATIVE Final   C Diff toxin NEGATIVE NEGATIVE Final   C Diff interpretation No C. difficile detected.  Final    Comment: Performed at [facility], [place] [address] [place], [place] [address]  Gastrointestinal Panel by PCR , Stool     Status: Abnormal   Collection Time: 08/08/20  4:30 PM   Specimen: STOOL  Result Value Ref Range Status   Campylobacter species NOT DETECTED NOT DETECTED Final   Plesimonas shigelloides NOT DETECTED NOT DETECTED Final   Salmonella species NOT DETECTED NOT DETECTED Final   Yersinia enterocolitica NOT DETECTED NOT DETECTED Final   Vibrio species NOT DETECTED NOT DETECTED Final   Vibrio cholerae NOT DETECTED NOT DETECTED Final   Enteroaggregative E coli (EAEC) NOT DETECTED NOT DETECTED Final   Enteropathogenic E coli (EPEC) DETECTED (A) NOT DETECTED Final    Comment: CRITICAL RESULT CALLED TO, READ BACK BY AND VERIFIED WITH: MELISSA HOUSE RN @1408  08/09/20 SCS    Enterotoxigenic E coli (ETEC) NOT DETECTED NOT DETECTED Final   Shiga like toxin producing E coli (STEC) NOT DETECTED NOT DETECTED Final   Shigella/Enteroinvasive E coli (EIEC) NOT DETECTED NOT DETECTED Final   Cryptosporidium NOT DETECTED NOT DETECTED Final   Cyclospora cayetanensis NOT DETECTED NOT DETECTED Final   Entamoeba histolytica NOT DETECTED NOT DETECTED Final   Giardia lamblia NOT DETECTED NOT DETECTED Final   Adenovirus F40/41 NOT DETECTED NOT DETECTED Final   Astrovirus NOT DETECTED NOT DETECTED Final   Norovirus GI/GII NOT DETECTED NOT DETECTED Final   Rotavirus A NOT DETECTED NOT DETECTED Final   Sapovirus (I, II, IV, and V) NOT DETECTED NOT DETECTED Final    Comment: Performed at [facility], [address]  [place], [place] [address]  SARS CORONAVIRUS 2 (TAT 6-24 HRS) Nasopharyngeal Nasopharyngeal Swab     Status: None   Collection Time: 08/08/20  8:38 PM   Specimen: Nasopharyngeal Swab  Result Value Ref Range Status   SARS Coronavirus 2 NEGATIVE NEGATIVE Final    Comment: (NOTE) SARS-CoV-2 target nucleic acids are NOT DETECTED.  The SARS-CoV-2 RNA is generally detectable in upper and lower respiratory specimens during the acute phase of infection. Negative results do not preclude SARS-CoV-2 infection, do not rule out co-infections with other pathogens, and should  not be used as the sole basis for treatment or other patient management decisions. Negative results must be combined with clinical observations, patient history, and epidemiological information. The expected result is Negative.  Fact Sheet for Patients: SugarRoll.be  Fact Sheet for Healthcare Providers: https://www.woods-mathews.com/  This test is not yet approved or cleared by the [place] FDA and  has been authorized for detection and/or diagnosis of SARS-CoV-2 by FDA under an Emergency Use Authorization (EUA). This EUA will remain  in effect (meaning this test can be used) for the duration of the COVID-19 declaration under Se ction 564(b)(1) of the Act, 21 U.S.C. section 360bbb-3(b)(1), unless the authorization is terminated or revoked sooner.  Performed at [facility], [place] [address], [place], [place] [address]       Radiology Studies: No results found.  Scheduled Meds:  amLODipine  2.5 mg Oral QHS   vitamin C  1,000 mg Oral Daily   cholecalciferol  1,000 Units Oral Daily   cholestyramine light  4 g Oral Q24H   diphenoxylate-atropine  1 tablet Oral QID   isosorbide mononitrate  15 mg Oral QHS   lactobacillus  1 g Oral TID WC   nystatin   Topical TID   omega-3 acid ethyl esters  1 g Oral Daily   pantoprazole  40 mg Oral Daily   pravastatin  20 mg Oral QHS    vitamin B-12  500 mcg Oral Daily   warfarin  2.5 mg Oral ONCE-1600   Warfarin - Pharmacist Dosing Inpatient   Does not apply q1600   Zinc Oxide   Topical QID   Continuous Infusions:  sodium chloride 250 mL (08/12/20 1618)   azithromycin 500 mg (08/13/20 1420)   eravacycline 93 mg (08/13/20 0241)     LOS: 4 days   Time spent: 25 minutes.  Patrecia Pour, MD [facility] [email or web address] 08/13/2020, 2:32 PM

## 2020-08-13 NOTE — Consult Note (Signed)
WOC Nurse Consult Note: Patient receiving care in [place]. Reason for Consult: MASD Wound type: MASD-IAD from frequent diarrhea When I reviewed the chart I found the following medications already ordered for application to the areas impacted by MASD-IAD:  4% lidocaine cream to be applied QID; Nystatin powder to be applied TID; 12.8% Triple Paste. I spoke with the patient's primary LPN, Christa and she tells me the areas are improving; and, careful attention is being paid to cleansing and application of these products with each pericare episode.  That being said, I have nothing further to add to the treatment of MASD-IAD from diarrhea, unless the medical team wishes to use a Flexiseal fecal containment system in an attempt to reduce contact of the feces with the skin.  [facility] nurse will not follow at this time.  Please re-consult the [facility] team if needed.  Val Riles, RN, MSN, CWOCN, CNS-BC, pager [phone]

## 2020-08-14 DIAGNOSIS — N1831 Chronic kidney disease, stage 3a: Secondary | ICD-10-CM | POA: Diagnosis not present

## 2020-08-14 DIAGNOSIS — A09 Infectious gastroenteritis and colitis, unspecified: Secondary | ICD-10-CM | POA: Diagnosis not present

## 2020-08-14 DIAGNOSIS — Z950 Presence of cardiac pacemaker: Secondary | ICD-10-CM | POA: Diagnosis not present

## 2020-08-14 DIAGNOSIS — I2510 Atherosclerotic heart disease of native coronary artery without angina pectoris: Secondary | ICD-10-CM | POA: Diagnosis not present

## 2020-08-14 LAB — BASIC METABOLIC PANEL
Anion gap: 9 (ref 5–15)
BUN: 36 mg/dL — ABNORMAL HIGH (ref 8–23)
CO2: 19 mmol/L — ABNORMAL LOW (ref 22–32)
Calcium: 10 mg/dL (ref 8.9–10.3)
Chloride: 113 mmol/L — ABNORMAL HIGH (ref 98–111)
Creatinine, Ser: 1.6 mg/dL — ABNORMAL HIGH (ref 0.61–1.24)
GFR, Estimated: 41 mL/min — ABNORMAL LOW (ref >60)
Glucose, Bld: 157 mg/dL — ABNORMAL HIGH (ref 70–99)
Potassium: 3.8 mmol/L (ref 3.5–5.1)
Sodium: 141 mmol/L (ref 135–145)

## 2020-08-14 LAB — CBC WITH DIFFERENTIAL/PLATELET
Abs Immature Granulocytes: 0.07 10*3/uL (ref 0.00–0.07)
Basophils Absolute: 0 10*3/uL (ref 0.0–0.1)
Basophils Relative: 0 %
Eosinophils Absolute: 2.8 10*3/uL — ABNORMAL HIGH (ref 0.0–0.5)
Eosinophils Relative: 25 %
HCT: 49.9 % (ref 39.0–52.0)
Hemoglobin: 15.7 g/dL (ref 13.0–17.0)
Immature Granulocytes: 1 %
Lymphocytes Relative: 21 %
Lymphs Abs: 2.3 10*3/uL (ref 0.7–4.0)
MCH: 25.8 pg — ABNORMAL LOW (ref 26.0–34.0)
MCHC: 31.5 g/dL (ref 30.0–36.0)
MCV: 81.9 fL (ref 80.0–100.0)
Monocytes Absolute: 1.1 10*3/uL — ABNORMAL HIGH (ref 0.1–1.0)
Monocytes Relative: 10 %
Neutro Abs: 4.8 10*3/uL (ref 1.7–7.7)
Neutrophils Relative %: 43 %
Platelets: 185 10*3/uL (ref 150–400)
RBC: 6.09 MIL/uL — ABNORMAL HIGH (ref 4.22–5.81)
RDW: 17.6 % — ABNORMAL HIGH (ref 11.5–15.5)
WBC: 11 10*3/uL — ABNORMAL HIGH (ref 4.0–10.5)
nRBC: 0 % (ref 0.0–0.2)

## 2020-08-14 LAB — PROTIME-INR
INR: 3.2 — ABNORMAL HIGH (ref 0.8–1.2)
Prothrombin Time: 33 seconds — ABNORMAL HIGH (ref 11.4–15.2)

## 2020-08-14 LAB — MAGNESIUM: Magnesium: 1.6 mg/dL — ABNORMAL LOW (ref 1.7–2.4)

## 2020-08-14 MED ORDER — PROCHLORPERAZINE EDISYLATE 10 MG/2ML IJ SOLN
5.0000 mg | Freq: Four times a day (QID) | INTRAMUSCULAR | Status: DC | PRN
  Administered 2020-08-14: 5 mg via INTRAVENOUS
  Filled 2020-08-14: qty 2

## 2020-08-14 MED ORDER — WARFARIN 0.5 MG HALF TABLET
0.5000 mg | ORAL_TABLET | Freq: Once | ORAL | Status: AC
  Administered 2020-08-14: 0.5 mg via ORAL
  Filled 2020-08-14: qty 1

## 2020-08-14 MED ORDER — MAGNESIUM SULFATE 2 GM/50ML IV SOLN
2.0000 g | Freq: Once | INTRAVENOUS | Status: AC
  Administered 2020-08-14: 2 g via INTRAVENOUS
  Filled 2020-08-14: qty 50

## 2020-08-14 MED ORDER — DIPHENOXYLATE-ATROPINE 2.5-0.025 MG PO TABS
2.0000 | ORAL_TABLET | Freq: Three times a day (TID) | ORAL | Status: DC
  Administered 2020-08-14 – 2020-08-15 (×2): 2 via ORAL
  Filled 2020-08-14 (×2): qty 2

## 2020-08-14 NOTE — Progress Notes (Signed)
PROGRESS NOTE  [name]  ZWC:[id number] DOB: August 29, 1931 DOA: 08/08/2020 PCP: Shirline Frees, MD  Outpatient Specialists: [facility] Brief Narrative: 87 y.o. male with medical history significant for CAD, biventricular cardiac pacemaker, s/p TAVR, persistent atrial fibrillation on Coumadin, combined systolic and diastolic HF, HLD, HTN, OSA on CPAP, hx of DVT/PE s/p IVC, hx of prostate cancer s/p radical prostatectomy, recent right renal subcapsular abscess secondary to ESBL in the setting of nonobstructing nephrolithiasis (followed by urology and ID as an outpatient) treated with 12 weeks of IV ertapenem (completed 08/08/2020), recently started on oral vancomycin from 08/06/2020 by ID for C. difficile diarrhea presented with worsening diarrhea along with vomiting.  On presentation, he was afebrile and normotensive with no leukocytosis. Enteropathogenic E. coli was found in stool for which azithromycin has been given. Antimotility agents and cholestyramine added. Rectal tube was required.   Assessment & Plan: Principal Problem:   Diarrhea Active Problems:   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)   Long term (current) use of anticoagulants   CAD (coronary artery disease), native coronary artery   Persistent atrial fibrillation (HCC)   Cardiac pacemaker in situ   CKD (chronic kidney disease), stage III (HCC)   S/P TAVR (transcatheter aortic valve replacement)   OSA on CPAP   Presence of IVC filter   Renal abscess   Infectious diarrhea   ESBL (extended spectrum beta-lactamase) producing bacteria infection  Enteropathogenic E. coli with severe and persistent diarrhea: C. diff negative.  - Due to severity of symptoms and old age/risk of worsening, azithromycin was given for total 5 days. - Continue lomotil, cholestyramine. Increase lomotil dosing. Continue lactobacillus.  - Needs barrier cream applied liberally and often. Rectal tube reinserted 7/8 for relief of  severe MASD.   MASD, candidiasis:  - Nystatin powder to be applied liberally, attempt to keep areas dry. - Foley catheter inserted due to severe MASD.  - WOC consulted for additional recommendations.    ESBL E. coli renal abscess: Remains on repeat imaging. Completed 12 weeks of IV ertapenem on 08/08/2020. - Continue eravacycline per ID. WBC persistently elevated. Will defer decision Re: continuation or alternative Tx to ID.  - Urology follow up planned   Vomiting:  - Antiemetic prn - Monitor closely.  - Increase IVF to 110cc/hr with dextrose.   Remote history of PE/DVT Persistent atrial fibrillation History of bioprosthetic TAVR Supratherapeutic INR - Continue coumadin per pharmacy, daily INR checks.    Hypomagnesemia:  - Supplement again today.   Heart block s/p pacemaker, CAD s/p PCI:  - Continue imdur, statin, coumadin - Outpatient follow-up with cardiology  Chronic kidney disease stage IIIa: Near baseline.  - Avoid nephrotoxins - With significant emesis, he is not be able to maintain hydration with such GI losses. Continue IVF as above.    Leukocytosis - Monitor  Hypertension - Continue amlodipine and imdur  Hyperlipidemia - Continue statin  OSA - Continue CPAP at bedtime   Generalized deconditioning - PT eval  Obesity: Estimated body mass index is 30.64 kg/m as calculated from the following:   Height as of this encounter: 5\' 8"  (1.727 m).   Weight as of this encounter: 91.4 kg.  DVT prophylaxis: Coumadin Code Status: Full Family Communication: Wife and daughter at bedside   Disposition Plan:  Status is: Inpatient  Remains inpatient appropriate because:Inpatient level of care appropriate due to severity of illness  Dispo: The patient is from: Home  Anticipated d/c is to: Home              Patient currently is not medically stable to d/c.   Difficult to place patient No  Consultants:  ID  Procedures:  Rectal tube reinserted  7/8 Foley catheter 7/8  Antimicrobials: Azithromycin  Eravacycline  Subjective: Still with nausea and vomiting when trying to take po but willing to continue trying. No abdominal pain. When laying still has no complaints whatsoever. Tolerated and tolerating foley and flexiseal. No fevers or flank pain.   Objective: Vitals:   08/13/20 0440 08/13/20 1336 08/13/20 2014 08/14/20 0550  BP:  122/68 112/75 125/68  Pulse:  63 66 63  Resp:  17 18 18   Temp:  98 F (36.7 C) 97.6 F (36.4 C) (!) 97.4 F (36.3 C)  TempSrc:  Oral Oral   SpO2:  98% 97% 97%  Weight: 91.4 kg     Height:        Intake/Output Summary (Last 24 hours) at 08/14/2020 1331 Last data filed at 08/14/2020 1100 Gross per 24 hour  Intake 1051.3 ml  Output 1175 ml  Net -123.7 ml   Filed Weights   08/11/20 0500 08/12/20 0500 08/13/20 0440  Weight: 92.2 kg 93 kg 91.4 kg   Gen: Pleasant, elderly male in no distress Pulm: Nonlabored breathing room air. Clear. CV: Regular rate and rhythm. No murmur, rub, or gallop. No JVD, no dependent edema. GI: Abdomen soft, not tender, not distended, with normoactive bowel sounds.  Ext: Warm, no deformities Skin: No new rashes, lesions or ulcers on visualized skin. Scrotal swelling and erythema reduced.  Neuro: Alert and oriented. HOH without any new focal neurological deficits. Psych: Judgement and insight appear fair. Mood euthymic & affect congruent. Behavior is appropriate.    Data Reviewed: I have personally reviewed following labs and imaging studies  CBC: Recent Labs  Lab 08/08/20 1443 08/09/20 0523 08/10/20 0433 08/11/20 0421 08/14/20 0516  WBC 10.2 10.6* 12.3* 12.9* 11.0*  NEUTROABS 6.8  --  6.1 6.3 4.8  HGB 14.8 13.4 12.7* 12.4* 15.7  HCT 47.9 42.7 40.6 39.6 49.9  MCV 83.3 82.6 82.9 82.5 81.9  PLT 219 201 172 170 740   Basic Metabolic Panel: Recent Labs  Lab 08/09/20 0523 08/10/20 0433 08/11/20 0421 08/12/20 0444 08/14/20 0516  NA 138 138 138 142 141  K  4.1 3.6 4.2 4.7 3.8  CL 109 109 110 114* 113*  CO2 22 22 22  21* 19*  GLUCOSE 111* 113* 108* 148* 157*  BUN 19 20 19 21  36*  CREATININE 1.41* 1.36* 1.40* 1.33* 1.60*  CALCIUM 9.4 9.4 9.3 9.9 10.0  MG  --  1.4* 1.7 1.7 1.6*   GFR: Estimated Creatinine Clearance: 35.7 mL/min (A) (by C-G formula based on SCr of 1.6 mg/dL (H)). Liver Function Tests: Recent Labs  Lab 08/08/20 1443  AST 17  ALT 6  ALKPHOS 89  BILITOT 0.5  PROT 7.3  ALBUMIN 3.3*   No results for input(s): LIPASE, AMYLASE in the last 168 hours. No results for input(s): AMMONIA in the last 168 hours. Coagulation Profile: Recent Labs  Lab 08/10/20 0433 08/11/20 0421 08/12/20 0444 08/13/20 0509 08/14/20 0516  INR 3.3* 2.8* 2.7* 2.6* 3.2*   Cardiac Enzymes: No results for input(s): CKTOTAL, CKMB, CKMBINDEX, TROPONINI in the last 168 hours. BNP (last 3 results) No results for input(s): PROBNP in the last 8760 hours. HbA1C: No results for input(s): HGBA1C in the last 72 hours. CBG: No results  for input(s): GLUCAP in the last 168 hours. Lipid Profile: No results for input(s): CHOL, HDL, LDLCALC, TRIG, CHOLHDL, LDLDIRECT in the last 72 hours. Thyroid Function Tests: No results for input(s): TSH, T4TOTAL, FREET4, T3FREE, THYROIDAB in the last 72 hours. Anemia Panel: No results for input(s): VITAMINB12, FOLATE, FERRITIN, TIBC, IRON, RETICCTPCT in the last 72 hours. Urine analysis:    Component Value Date/Time   COLORURINE YELLOW 05/15/2020 2307   APPEARANCEUR CLEAR 05/15/2020 2307   LABSPEC 1.033 (H) 05/15/2020 2307   PHURINE 7.0 05/15/2020 2307   GLUCOSEU NEGATIVE 05/15/2020 2307   HGBUR NEGATIVE 05/15/2020 2307   BILIRUBINUR NEGATIVE 05/15/2020 2307   KETONESUR NEGATIVE 05/15/2020 2307   PROTEINUR NEGATIVE 05/15/2020 2307   UROBILINOGEN 0.2 09/19/2014 1027   NITRITE NEGATIVE 05/15/2020 2307   LEUKOCYTESUR NEGATIVE 05/15/2020 2307   Recent Results (from the past 240 hour(s))  C Difficile Quick Screen w  PCR reflex     Status: None   Collection Time: 08/08/20  4:30 PM   Specimen: STOOL  Result Value Ref Range Status   C Diff antigen NEGATIVE NEGATIVE Final   C Diff toxin NEGATIVE NEGATIVE Final   C Diff interpretation No C. difficile detected.  Final    Comment: Performed at [facility], [place] [address] [place], [place] [address]  Gastrointestinal Panel by PCR , Stool     Status: Abnormal   Collection Time: 08/08/20  4:30 PM   Specimen: STOOL  Result Value Ref Range Status   Campylobacter species NOT DETECTED NOT DETECTED Final   Plesimonas shigelloides NOT DETECTED NOT DETECTED Final   Salmonella species NOT DETECTED NOT DETECTED Final   Yersinia enterocolitica NOT DETECTED NOT DETECTED Final   Vibrio species NOT DETECTED NOT DETECTED Final   Vibrio cholerae NOT DETECTED NOT DETECTED Final   Enteroaggregative E coli (EAEC) NOT DETECTED NOT DETECTED Final   Enteropathogenic E coli (EPEC) DETECTED (A) NOT DETECTED Final    Comment: CRITICAL RESULT CALLED TO, READ BACK BY AND VERIFIED WITH: MELISSA HOUSE RN @1408  08/09/20 SCS    Enterotoxigenic E coli (ETEC) NOT DETECTED NOT DETECTED Final   Shiga like toxin producing E coli (STEC) NOT DETECTED NOT DETECTED Final   Shigella/Enteroinvasive E coli (EIEC) NOT DETECTED NOT DETECTED Final   Cryptosporidium NOT DETECTED NOT DETECTED Final   Cyclospora cayetanensis NOT DETECTED NOT DETECTED Final   Entamoeba histolytica NOT DETECTED NOT DETECTED Final   Giardia lamblia NOT DETECTED NOT DETECTED Final   Adenovirus F40/41 NOT DETECTED NOT DETECTED Final   Astrovirus NOT DETECTED NOT DETECTED Final   Norovirus GI/GII NOT DETECTED NOT DETECTED Final   Rotavirus A NOT DETECTED NOT DETECTED Final   Sapovirus (I, II, IV, and V) NOT DETECTED NOT DETECTED Final    Comment: Performed at [facility], [address] [place], [place] [address]  SARS CORONAVIRUS 2 (TAT 6-24 HRS) Nasopharyngeal Nasopharyngeal Swab      Status: None   Collection Time: 08/08/20  8:38 PM   Specimen: Nasopharyngeal Swab  Result Value Ref Range Status   SARS Coronavirus 2 NEGATIVE NEGATIVE Final    Comment: (NOTE) SARS-CoV-2 target nucleic acids are NOT DETECTED.  The SARS-CoV-2 RNA is generally detectable in upper and lower respiratory specimens during the acute phase of infection. Negative results do not preclude SARS-CoV-2 infection, do not rule out co-infections with other pathogens, and should not be used as the sole basis for treatment or other patient management decisions. Negative results must be combined  with clinical observations, patient history, and epidemiological information. The expected result is Negative.  Fact Sheet for Patients: SugarRoll.be  Fact Sheet for Healthcare Providers: https://www.woods-mathews.com/  This test is not yet approved or cleared by the [place] FDA and  has been authorized for detection and/or diagnosis of SARS-CoV-2 by FDA under an Emergency Use Authorization (EUA). This EUA will remain  in effect (meaning this test can be used) for the duration of the COVID-19 declaration under Se ction 564(b)(1) of the Act, 21 U.S.C. section 360bbb-3(b)(1), unless the authorization is terminated or revoked sooner.  Performed at [facility], [place] [address], [place], [place] [address]       Radiology Studies: No results found.  Scheduled Meds:  amLODipine  2.5 mg Oral QHS   vitamin C  1,000 mg Oral Daily   Chlorhexidine Gluconate Cloth  6 each Topical Daily   cholecalciferol  1,000 Units Oral Daily   cholestyramine light  4 g Oral Q24H   diphenoxylate-atropine  1 tablet Oral QID   isosorbide mononitrate  15 mg Oral QHS   lactobacillus  1 g Oral TID WC   nystatin   Topical TID   omega-3 acid ethyl esters  1 g Oral Daily   pantoprazole  40 mg Oral Daily   pravastatin  20 mg Oral QHS   vitamin B-12  500 mcg Oral Daily    warfarin  0.5 mg Oral ONCE-1600   Warfarin - Pharmacist Dosing Inpatient   Does not apply q1600   Zinc Oxide   Topical QID   Continuous Infusions:  sodium chloride 250 mL (08/12/20 1618)   azithromycin 500 mg (08/13/20 1420)   dextrose 5 % and 0.45% NaCl 75 mL/hr at 08/14/20 0620   eravacycline 93 mg (08/14/20 0511)     LOS: 5 days   Time spent: 25 minutes.  Patrecia Pour, MD [facility] [email or web address] 08/14/2020, 1:31 PM

## 2020-08-14 NOTE — Progress Notes (Signed)
Patient having difficulty keeping food or medicine down with zofran prn given. Copious amounts of fecal drainage with rectal tube, over 1500cc. Will provide with more nausea medication. Patient's perineum and scrotum raw, red, angry and painful. Ointment regime in place with incontinent care.  Will monitor patient closely.

## 2020-08-14 NOTE — Progress Notes (Signed)
Patient continuing to vomit, poor po intake this day. PRN compazine given at 1800, pending results.

## 2020-08-14 NOTE — Progress Notes (Signed)
PT Cancellation Note  Patient Details Name: [name] MRN: [id number] DOB: Jun 11, 1931   Cancelled Treatment:    Reason Eval/Treat Not Completed: Medical issues which prohibited therapy, has flexiseal again, RN requests to  not mobilize today.   Claretha Cooper 08/14/2020, 2:19 PM   [place] Pager [phone] Office [phone]

## 2020-08-14 NOTE — Progress Notes (Signed)
ANTICOAGULATION CONSULT NOTE - Follow Up Consult  Pharmacy Consult for warfarin Indication: hx atrial fibrillation, pulmonary embolus, and DVT  Allergies  Allergen Reactions   Darifenacin Nausea Only and Other (See Comments)    dry mouth and dizziness ENABLEX Dizziness Pt denies   Codeine Other (See Comments)   Sulfamethoxazole-Trimethoprim Other (See Comments)   Lisinopril Cough    Pt denies    Patient Measurements: Height: 5\' 8"  (172.7 cm) Weight: 91.4 kg (201 lb 8 oz) IBW/kg (Calculated) : 68.4  Vital Signs: Temp: 97.4 F (36.3 C) (07/09 0550) BP: 125/68 (07/09 0550) Pulse Rate: 63 (07/09 0550)  Labs: Recent Labs    08/12/20 0444 08/13/20 0509 08/14/20 0516  HGB  --   --  15.7  HCT  --   --  49.9  PLT  --   --  185  LABPROT 28.5* 27.5* 33.0*  INR 2.7* 2.6* 3.2*  CREATININE 1.33*  --  1.60*     Estimated Creatinine Clearance: 35.7 mL/min (A) (by C-G formula based on SCr of 1.6 mg/dL (H)).   Medications:  PTA warfarin regimen: 2.5mg  daily except 1.25mg  on Wed and Sat  Assessment: Patient is an 87 y.o M with hx DVT/PE (s/p IVC filter) and afib on warfarin PTA, presented to the ED on 7/3 with c/o diarrhea and vomiting.  Warfarin resumed on admission.  Today, 08/14/2020: - INR with large increase from 2.6 to 3.2 overnight.  Supratherapeutic, suspect related to diarrhea or drug-drug interactions - CBC: Hgb increased to 15.7 from 12.4, Plt stable WNL - Drug-drug interactions:  eravacycline but also questran - SCr increased to 1.6 from 1.3 - Pt noted to have EPEC profuse watery diarrhea which worsened on 7/8.  Goal of Therapy:  INR 2-3 Monitor platelets by anticoagulation protocol: Yes   Plan:  - Warfarin 0.5 mg PO today at 1600 - daily INR - monitor for s/sx bleeding  Gretta Arab PharmD, BCPS Clinical Pharmacist [facility] main pharmacy [phone] 08/14/2020 11:58 AM

## 2020-08-15 DIAGNOSIS — Z950 Presence of cardiac pacemaker: Secondary | ICD-10-CM | POA: Diagnosis not present

## 2020-08-15 DIAGNOSIS — I2510 Atherosclerotic heart disease of native coronary artery without angina pectoris: Secondary | ICD-10-CM | POA: Diagnosis not present

## 2020-08-15 DIAGNOSIS — A09 Infectious gastroenteritis and colitis, unspecified: Secondary | ICD-10-CM | POA: Diagnosis not present

## 2020-08-15 DIAGNOSIS — N1831 Chronic kidney disease, stage 3a: Secondary | ICD-10-CM | POA: Diagnosis not present

## 2020-08-15 LAB — BASIC METABOLIC PANEL WITH GFR
Anion gap: 7 (ref 5–15)
BUN: 48 mg/dL — ABNORMAL HIGH (ref 8–23)
CO2: 17 mmol/L — ABNORMAL LOW (ref 22–32)
Calcium: 9.7 mg/dL (ref 8.9–10.3)
Chloride: 114 mmol/L — ABNORMAL HIGH (ref 98–111)
Creatinine, Ser: 2.05 mg/dL — ABNORMAL HIGH (ref 0.61–1.24)
GFR, Estimated: 31 mL/min — ABNORMAL LOW
Glucose, Bld: 146 mg/dL — ABNORMAL HIGH (ref 70–99)
Potassium: 3.5 mmol/L (ref 3.5–5.1)
Sodium: 138 mmol/L (ref 135–145)

## 2020-08-15 LAB — PROTIME-INR
INR: 4.2 (ref 0.8–1.2)
Prothrombin Time: 40.3 s — ABNORMAL HIGH (ref 11.4–15.2)

## 2020-08-15 LAB — MAGNESIUM: Magnesium: 1.5 mg/dL — ABNORMAL LOW (ref 1.7–2.4)

## 2020-08-15 MED ORDER — MAGNESIUM SULFATE 4 GM/100ML IV SOLN
4.0000 g | Freq: Once | INTRAVENOUS | Status: AC
  Administered 2020-08-15: 4 g via INTRAVENOUS
  Filled 2020-08-15: qty 100

## 2020-08-15 MED ORDER — LACTATED RINGERS IV SOLN: INTRAVENOUS | Status: DC

## 2020-08-15 MED ORDER — PANTOPRAZOLE SODIUM 40 MG IV SOLR
40.0000 mg | INTRAVENOUS | Status: DC
  Administered 2020-08-15 – 2020-08-19 (×5): 40 mg via INTRAVENOUS
  Filled 2020-08-15 (×6): qty 40

## 2020-08-15 MED ORDER — DIPHENOXYLATE-ATROPINE 2.5-0.025 MG PO TABS
1.0000 | ORAL_TABLET | Freq: Three times a day (TID) | ORAL | Status: DC
  Administered 2020-08-15 – 2020-08-18 (×9): 1 via ORAL
  Filled 2020-08-15 (×8): qty 1

## 2020-08-15 NOTE — Progress Notes (Signed)
Critical Lab: INR 4.2  MD made aware face to face conversation. New order:hold Coumadin this afternoon.

## 2020-08-15 NOTE — Progress Notes (Signed)
Long conversation had with pts wife over concerns she has with her husband. Emotional support and listening to her concerns as to whether or not he is going to  get better or does he need to be made a DNR. Pts wife expressed a desire for a Palliative Care consult and or a conference with Dr Bonner Puna and the ID team.  She did not want pt awakened for his 10pm meds and will notify me when he awakens to give any PO meds.

## 2020-08-15 NOTE — Progress Notes (Signed)
ANTICOAGULATION CONSULT NOTE - Follow Up Consult  Pharmacy Consult for warfarin Indication: hx atrial fibrillation, pulmonary embolus, DVT, and TAVR  Allergies  Allergen Reactions   Darifenacin Nausea Only and Other (See Comments)    dry mouth and dizziness ENABLEX Dizziness Pt denies   Codeine Other (See Comments)   Sulfamethoxazole-Trimethoprim Other (See Comments)   Lisinopril Cough    Pt denies    Patient Measurements: Height: 5\' 8"  (172.7 cm) Weight: 91.4 kg (201 lb 8 oz) IBW/kg (Calculated) : 68.4  Vital Signs: Temp: 97.8 F (36.6 C) (07/10 0641) Temp Source: Oral (07/10 0641) BP: 105/80 (07/10 0641) Pulse Rate: 64 (07/10 0641)  Labs: Recent Labs    08/13/20 0509 08/14/20 0516 08/15/20 0518  HGB  --  15.7  --   HCT  --  49.9  --   PLT  --  185  --   LABPROT 27.5* 33.0* 40.3*  INR 2.6* 3.2* 4.2*  CREATININE  --  1.60* 2.05*     Estimated Creatinine Clearance: 27.9 mL/min (A) (by C-G formula based on SCr of 2.05 mg/dL (H)).   Medications:  PTA warfarin regimen: 2.5mg  daily except 1.25mg  on Wed and Sat  Assessment: Patient is an 87 y.o M with hx DVT/PE (s/p IVC filter) and afib on warfarin PTA, presented to the ED on 7/3 with c/o diarrhea and vomiting.  Warfarin resumed on admission.  Today, 08/15/2020: - supratherapeutic INR with large increase overnight.  Increased from 2.6 > 3.2 >4.2 despite decreased doses.  Suspect related to ongoing diarrhea and/or drug-drug interactions - CBC: Hgb increased to 15.7 from 12.4, Plt stable WNL - Drug-drug interactions:  azithromycin, eravacycline, questran - AKI:  SCr increased to 2 - Pt noted to have EPEC profuse watery diarrhea which is worsened.  Goal of Therapy:  INR 2-3 Monitor platelets by anticoagulation protocol: Yes   Plan:  - Hold warfarin doses - daily INR - monitor for s/sx bleeding, thrombosis  Gretta Arab PharmD, BCPS Clinical Pharmacist [facility] main pharmacy [phone] 08/15/2020 11:09  AM

## 2020-08-15 NOTE — Progress Notes (Signed)
PROGRESS NOTE  [name]  JOA:[id number] DOB: [birth year] DOA: 08/08/2020 PCP: Shirline Frees, MD  Outpatient Specialists: [facility] Brief Narrative: 87 y.o. male with medical history significant for CAD, biventricular cardiac pacemaker, s/p TAVR, persistent atrial fibrillation on Coumadin, combined systolic and diastolic HF, HLD, HTN, OSA on CPAP, hx of DVT/PE s/p IVC, hx of prostate cancer s/p radical prostatectomy, recent right renal subcapsular abscess secondary to ESBL in the setting of nonobstructing nephrolithiasis (followed by urology and ID as an outpatient) treated with 12 weeks of IV ertapenem (completed 08/08/2020), recently started on oral vancomycin from 08/06/2020 by ID for C. difficile diarrhea presented with worsening diarrhea along with vomiting.  On presentation, he was afebrile and normotensive with no leukocytosis. Enteropathogenic E. coli was found in stool for which azithromycin has been given. Antimotility agents and cholestyramine added. Rectal tube was required. Foley also inserted due to incontinence causing severe skin irritation.    Assessment & Plan: Principal Problem:   Diarrhea Active Problems:   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)   Long term (current) use of anticoagulants   CAD (coronary artery disease), native coronary artery   Persistent atrial fibrillation (HCC)   Cardiac pacemaker in situ   CKD (chronic kidney disease), stage III (HCC)   S/P TAVR (transcatheter aortic valve replacement)   OSA on CPAP   Presence of IVC filter   Renal abscess   Infectious diarrhea   ESBL (extended spectrum beta-lactamase) producing bacteria infection  Enteropathogenic E. coli with severe and persistent diarrhea: C. diff negative.  - Due to severity of symptoms and old age/risk of worsening, azithromycin was given for total 5 days. - Continue lomotil, cholestyramine. Decrease lomotil dosing. Continue lactobacillus. Stop vitamin C. Also  stopping other oral meds to minimize pill burden with vomiting.  - Needs barrier cream applied liberally and often. Rectal tube reinserted 7/8 for relief of severe MASD.  - Given persistence, we will need to entertain thought of PICC/TPN. Per family request, will ask for palliative care evaluation (known to the service from last hospitalization per wife) to continue [place] discussions and to assist with any further symptom control measures. - Considering CT abd/pelvis repeat given slight abdominal discomfort and persistent of GI symptoms, though renal impairment will not support contrast administration today. Will recheck BMP in AM after IVF and consider CT 7/11.   MASD, candidiasis:  - Nystatin powder to be applied liberally, attempt to keep areas dry. - Foley catheter inserted due to severe MASD.  - WOC consulted for additional recommendations.    ESBL E. coli renal abscess: Remains on repeat imaging. Completed 12 weeks of IV ertapenem on 08/08/2020. - Continue eravacycline per ID. WBC persistently elevated.  - Urology follow up planned   Vomiting:  - Antiemetic prn - Monitor closely.   Remote history of PE/DVT Persistent atrial fibrillation History of bioprosthetic TAVR Supratherapeutic INR - Continue coumadin per pharmacy, daily INR checks.    Hypomagnesemia:  - Supplement with 4g IV today. Cannot give po.  Heart block s/p pacemaker, CAD s/p PCI:  - Continue imdur, statin, coumadin - Outpatient follow-up with cardiology  AKI on stage IIIa CKD:  - Avoid nephrotoxins - Augment IVF to LR at 125cc/hr to try to keep up with GI losses.    Leukocytosis - Monitor  Hypertension - Continue amlodipine and imdur  Hyperlipidemia - Continue statin  OSA - Continue CPAP at bedtime   Generalized deconditioning - PT eval  Obesity: Estimated body  mass index is 30.64 kg/m as calculated from the following:   Height as of this encounter: 5\' 8"  (1.727 m).   Weight as of this encounter:  91.4 kg.  DVT prophylaxis: Coumadin Code Status: Full Family Communication: Wife at bedside   Disposition Plan:  Status is: Inpatient  Remains inpatient appropriate because:Inpatient level of care appropriate due to severity of illness  Dispo: The patient is from: Home              Anticipated d/c is to: Home              Patient currently is not medically stable to d/c.   Difficult to place patient No  Consultants:  ID  Procedures:  Rectal tube reinserted 7/8 Foley catheter 7/8  Antimicrobials: Azithromycin  Eravacycline  Subjective: More delirious/disoriented last night but waxing and waning, has continued to fear he's dying. Still vomiting with any po intake, none otherwise. Diarrhea is getting more watery, though skin breakdown is improving by report and discomfort decreasing per pt.   Objective: Vitals:   08/14/20 0550 08/14/20 1412 08/14/20 2026 08/15/20 0641  BP: 125/68 118/73 132/67 105/80  Pulse: 63 67  64  Resp: 18 19 17 17   Temp: (!) 97.4 F (36.3 C) 97.9 F (36.6 C) 97.7 F (36.5 C) 97.8 F (36.6 C)  TempSrc:   Oral Oral  SpO2: 97% (!) 84%  94%  Weight:      Height:        Intake/Output Summary (Last 24 hours) at 08/15/2020 0933 Last data filed at 08/15/2020 0700 Gross per 24 hour  Intake 1844.85 ml  Output 2950 ml  Net -1105.15 ml   Filed Weights   08/11/20 0500 08/12/20 0500 08/13/20 0440  Weight: 92.2 kg 93 kg 91.4 kg   Gen: Elderly frail male in no distress Pulm: Nonlabored breathing room air. Clear. CV: Regular rate and rhythm. No murmur, rub, or gallop. No JVD, no pitting dependent edema. GI: Abdomen soft, mild diffuse tenderness without rebound or guarding or masses, non-distended, with normoactive bowel sounds.  Ext: Warm, no deformities Skin: No new rashes, lesions or ulcers on visualized skin. Scrotum and medial thighs with much improved erythema. Neuro: Alert, HOH, incompletely oriented without focal neurological deficits. Psych:  Judgement and insight appear marginal. Calm  Data Reviewed: I have personally reviewed following labs and imaging studies  CBC: Recent Labs  Lab 08/08/20 1443 08/09/20 0523 08/10/20 0433 08/11/20 0421 08/14/20 0516  WBC 10.2 10.6* 12.3* 12.9* 11.0*  NEUTROABS 6.8  --  6.1 6.3 4.8  HGB 14.8 13.4 12.7* 12.4* 15.7  HCT 47.9 42.7 40.6 39.6 49.9  MCV 83.3 82.6 82.9 82.5 81.9  PLT 219 201 172 170 350   Basic Metabolic Panel: Recent Labs  Lab 08/10/20 0433 08/11/20 0421 08/12/20 0444 08/14/20 0516 08/15/20 0518  NA 138 138 142 141 138  K 3.6 4.2 4.7 3.8 3.5  CL 109 110 114* 113* 114*  CO2 22 22 21* 19* 17*  GLUCOSE 113* 108* 148* 157* 146*  BUN 20 19 21  36* 48*  CREATININE 1.36* 1.40* 1.33* 1.60* 2.05*  CALCIUM 9.4 9.3 9.9 10.0 9.7  MG 1.4* 1.7 1.7 1.6* 1.5*   GFR: Estimated Creatinine Clearance: 27.9 mL/min (A) (by C-G formula based on SCr of 2.05 mg/dL (H)). Liver Function Tests: Recent Labs  Lab 08/08/20 1443  AST 17  ALT 6  ALKPHOS 89  BILITOT 0.5  PROT 7.3  ALBUMIN 3.3*   No results for  input(s): LIPASE, AMYLASE in the last 168 hours. No results for input(s): AMMONIA in the last 168 hours. Coagulation Profile: Recent Labs  Lab 08/10/20 0433 08/11/20 0421 08/12/20 0444 08/13/20 0509 08/14/20 0516  INR 3.3* 2.8* 2.7* 2.6* 3.2*   Cardiac Enzymes: No results for input(s): CKTOTAL, CKMB, CKMBINDEX, TROPONINI in the last 168 hours. BNP (last 3 results) No results for input(s): PROBNP in the last 8760 hours. HbA1C: No results for input(s): HGBA1C in the last 72 hours. CBG: No results for input(s): GLUCAP in the last 168 hours. Lipid Profile: No results for input(s): CHOL, HDL, LDLCALC, TRIG, CHOLHDL, LDLDIRECT in the last 72 hours. Thyroid Function Tests: No results for input(s): TSH, T4TOTAL, FREET4, T3FREE, THYROIDAB in the last 72 hours. Anemia Panel: No results for input(s): VITAMINB12, FOLATE, FERRITIN, TIBC, IRON, RETICCTPCT in the last 72  hours. Urine analysis:    Component Value Date/Time   COLORURINE YELLOW 05/15/2020 2307   APPEARANCEUR CLEAR 05/15/2020 2307   LABSPEC 1.033 (H) 05/15/2020 2307   PHURINE 7.0 05/15/2020 2307   GLUCOSEU NEGATIVE 05/15/2020 2307   HGBUR NEGATIVE 05/15/2020 2307   BILIRUBINUR NEGATIVE 05/15/2020 2307   KETONESUR NEGATIVE 05/15/2020 2307   PROTEINUR NEGATIVE 05/15/2020 2307   UROBILINOGEN 0.2 09/19/2014 1027   NITRITE NEGATIVE 05/15/2020 2307   LEUKOCYTESUR NEGATIVE 05/15/2020 2307   Recent Results (from the past 240 hour(s))  C Difficile Quick Screen w PCR reflex     Status: None   Collection Time: 08/08/20  4:30 PM   Specimen: STOOL  Result Value Ref Range Status   C Diff antigen NEGATIVE NEGATIVE Final   C Diff toxin NEGATIVE NEGATIVE Final   C Diff interpretation No C. difficile detected.  Final    Comment: Performed at [facility], [place] [address] [place],  [address]  Gastrointestinal Panel by PCR , Stool     Status: Abnormal   Collection Time: 08/08/20  4:30 PM   Specimen: STOOL  Result Value Ref Range Status   Campylobacter species NOT DETECTED NOT DETECTED Final   Plesimonas shigelloides NOT DETECTED NOT DETECTED Final   Salmonella species NOT DETECTED NOT DETECTED Final   Yersinia enterocolitica NOT DETECTED NOT DETECTED Final   Vibrio species NOT DETECTED NOT DETECTED Final   Vibrio cholerae NOT DETECTED NOT DETECTED Final   Enteroaggregative E coli (EAEC) NOT DETECTED NOT DETECTED Final   Enteropathogenic E coli (EPEC) DETECTED (A) NOT DETECTED Final    Comment: CRITICAL RESULT CALLED TO, READ BACK BY AND VERIFIED WITH: MELISSA HOUSE RN @1408  08/09/20 SCS    Enterotoxigenic E coli (ETEC) NOT DETECTED NOT DETECTED Final   Shiga like toxin producing E coli (STEC) NOT DETECTED NOT DETECTED Final   Shigella/Enteroinvasive E coli (EIEC) NOT DETECTED NOT DETECTED Final   Cryptosporidium NOT DETECTED NOT DETECTED Final   Cyclospora cayetanensis  NOT DETECTED NOT DETECTED Final   Entamoeba histolytica NOT DETECTED NOT DETECTED Final   Giardia lamblia NOT DETECTED NOT DETECTED Final   Adenovirus F40/41 NOT DETECTED NOT DETECTED Final   Astrovirus NOT DETECTED NOT DETECTED Final   Norovirus GI/GII NOT DETECTED NOT DETECTED Final   Rotavirus A NOT DETECTED NOT DETECTED Final   Sapovirus (I, II, IV, and V) NOT DETECTED NOT DETECTED Final    Comment: Performed at [facility], [place] [place], [place] [address]  SARS CORONAVIRUS 2 (TAT 6-24 HRS) Nasopharyngeal Nasopharyngeal Swab     Status: None   Collection Time: 08/08/20  8:38 PM  Specimen: Nasopharyngeal Swab  Result Value Ref Range Status   SARS Coronavirus 2 NEGATIVE NEGATIVE Final    Comment: (NOTE) SARS-CoV-2 target nucleic acids are NOT DETECTED.  The SARS-CoV-2 RNA is generally detectable in upper and lower respiratory specimens during the acute phase of infection. Negative results do not preclude SARS-CoV-2 infection, do not rule out co-infections with other pathogens, and should not be used as the sole basis for treatment or other patient management decisions. Negative results must be combined with clinical observations, patient history, and epidemiological information. The expected result is Negative.  Fact Sheet for Patients: SugarRoll.be  Fact Sheet for Healthcare Providers: https://www.woods-mathews.com/  This test is not yet approved or cleared by the [place] FDA and  has been authorized for detection and/or diagnosis of SARS-CoV-2 by FDA under an Emergency Use Authorization (EUA). This EUA will remain  in effect (meaning this test can be used) for the duration of the COVID-19 declaration under Se ction 564(b)(1) of the Act, 21 U.S.C. section 360bbb-3(b)(1), unless the authorization is terminated or revoked sooner.  Performed at [facility], [place] [address] [place], [place] [address]        Radiology Studies: No results found.  Scheduled Meds:  amLODipine  2.5 mg Oral QHS   Chlorhexidine Gluconate Cloth  6 each Topical Daily   cholestyramine light  4 g Oral Q24H   diphenoxylate-atropine  1 tablet Oral TID   isosorbide mononitrate  15 mg Oral QHS   lactobacillus  1 g Oral TID WC   nystatin   Topical TID   pantoprazole  40 mg Oral Daily   pravastatin  20 mg Oral QHS   Warfarin - Pharmacist Dosing Inpatient   Does not apply q1600   Zinc Oxide   Topical QID   Continuous Infusions:  sodium chloride 250 mL (08/12/20 1618)   azithromycin 500 mg (08/13/20 1420)   eravacycline 93 mg (08/15/20 0331)   lactated ringers     magnesium sulfate bolus IVPB 4 g (08/15/20 0830)     LOS: 6 days   Time spent: 35 minutes.  Patrecia Pour, MD [facility] [email or web address] 08/15/2020, 9:33 AM

## 2020-08-16 DIAGNOSIS — I2510 Atherosclerotic heart disease of native coronary artery without angina pectoris: Secondary | ICD-10-CM | POA: Diagnosis not present

## 2020-08-16 DIAGNOSIS — A09 Infectious gastroenteritis and colitis, unspecified: Secondary | ICD-10-CM | POA: Diagnosis not present

## 2020-08-16 DIAGNOSIS — Z950 Presence of cardiac pacemaker: Secondary | ICD-10-CM | POA: Diagnosis not present

## 2020-08-16 DIAGNOSIS — N1831 Chronic kidney disease, stage 3a: Secondary | ICD-10-CM | POA: Diagnosis not present

## 2020-08-16 LAB — BASIC METABOLIC PANEL
Anion gap: 8 (ref 5–15)
BUN: 55 mg/dL — ABNORMAL HIGH (ref 8–23)
CO2: 18 mmol/L — ABNORMAL LOW (ref 22–32)
Calcium: 9.4 mg/dL (ref 8.9–10.3)
Chloride: 113 mmol/L — ABNORMAL HIGH (ref 98–111)
Creatinine, Ser: 1.92 mg/dL — ABNORMAL HIGH (ref 0.61–1.24)
GFR, Estimated: 33 mL/min — ABNORMAL LOW (ref >60)
Glucose, Bld: 101 mg/dL — ABNORMAL HIGH (ref 70–99)
Potassium: 3.4 mmol/L — ABNORMAL LOW (ref 3.5–5.1)
Sodium: 139 mmol/L (ref 135–145)

## 2020-08-16 LAB — CBC
HCT: 42.6 % (ref 39.0–52.0)
Hemoglobin: 13.7 g/dL (ref 13.0–17.0)
MCH: 26.1 pg (ref 26.0–34.0)
MCHC: 32.2 g/dL (ref 30.0–36.0)
MCV: 81.1 fL (ref 80.0–100.0)
Platelets: 161 10*3/uL (ref 150–400)
RBC: 5.25 MIL/uL (ref 4.22–5.81)
RDW: 16.5 % — ABNORMAL HIGH (ref 11.5–15.5)
WBC: 14.5 10*3/uL — ABNORMAL HIGH (ref 4.0–10.5)
nRBC: 0 % (ref 0.0–0.2)

## 2020-08-16 LAB — PROTIME-INR
INR: 4.5 (ref 0.8–1.2)
Prothrombin Time: 42.5 seconds — ABNORMAL HIGH (ref 11.4–15.2)

## 2020-08-16 LAB — MAGNESIUM: Magnesium: 2.3 mg/dL (ref 1.7–2.4)

## 2020-08-16 NOTE — Progress Notes (Signed)
ANTICOAGULATION CONSULT NOTE - Follow Up Consult  Pharmacy Consult for warfarin Indication: hx atrial fibrillation, pulmonary embolus, and DVT  Allergies  Allergen Reactions   Darifenacin Nausea Only and Other (See Comments)    dry mouth and dizziness ENABLEX Dizziness Pt denies   Codeine Other (See Comments)   Sulfamethoxazole-Trimethoprim Other (See Comments)   Lisinopril Cough    Pt denies    Patient Measurements: Height: 5\' 8"  (172.7 cm) Weight: 91.4 kg (201 lb 8 oz) IBW/kg (Calculated) : 68.4 Heparin Dosing Weight:   Vital Signs: Temp: 97.6 F (36.4 C) (07/11 0628) Temp Source: Oral (07/11 0628) BP: 116/70 (07/11 0628) Pulse Rate: 66 (07/11 0628)  Labs: Recent Labs    08/14/20 0516 08/15/20 0518 08/16/20 0434  HGB 15.7  --  13.7  HCT 49.9  --  42.6  PLT 185  --  161  LABPROT 33.0* 40.3* 42.5*  INR 3.2* 4.2* 4.5*  CREATININE 1.60* 2.05* 1.92*     Estimated Creatinine Clearance: 29.8 mL/min (A) (by C-G formula based on SCr of 1.92 mg/dL (H)).   Medications:  PTA warfarin regimen: 2.5mg  daily except 1.25mg  on Wed and Sat  Assessment: Patient is an 87 y.o M with hx DVT/PE (s/p IVC filter) and afib on warfarin PTA, presented to the ED on 7/3 with c/o diarrhea and vomiting.  Warfarin resumed on admission.  Today, 08/16/2020: - INR is supra-therapeutic at 4.5 - hgb 13.7, plts 161K - drug-drug intxns: being on abx can make pt more sensitive to warfarin (azithromycin and eravacycline) - on regular diet   Goal of Therapy:  INR 2-3 Monitor platelets by anticoagulation protocol: Yes   Plan:  - hold warfarin dose today d/t elevated INR - daily INR - monitor for s/sx bleeding  Markian Glockner P 08/16/2020,10:40 AM

## 2020-08-16 NOTE — Care Management Important Message (Signed)
Important Message  Patient Details IM Letter given to the Patient. Name: [name] MRN: [id number] Date of Birth: [birth year]   Medicare Important Message Given:  Yes     Kerin Salen 08/16/2020, 9:03 AM

## 2020-08-16 NOTE — Progress Notes (Signed)
[facility] for Infectious Disease    Date of Admission:  08/08/2020   Total days of antibiotics day 6 azithromycin           ID: [name] is a 87 y.o. male with   Principal Problem:   Diarrhea Active Problems:   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)   Long term (current) use of anticoagulants   CAD (coronary artery disease), native coronary artery   Persistent atrial fibrillation (HCC)   Cardiac pacemaker in situ   CKD (chronic kidney disease), stage III (HCC)   S/P TAVR (transcatheter aortic valve replacement)   OSA on CPAP   Presence of IVC filter   Renal abscess   Infectious diarrhea   ESBL (extended spectrum beta-lactamase) producing bacteria infection    Subjective: Afebrile. But had nausea with abtx (unclear to which one) has had perfuse diarrhea. Improvement with rash to buttocks since having rectal tube and foley catheter  Medications:   amLODipine  2.5 mg Oral QHS   Chlorhexidine Gluconate Cloth  6 each Topical Daily   cholestyramine light  4 g Oral Q24H   diphenoxylate-atropine  1 tablet Oral TID   isosorbide mononitrate  15 mg Oral QHS   lactobacillus  1 g Oral TID WC   nystatin   Topical TID   pantoprazole (PROTONIX) IV  40 mg Intravenous Q24H   Warfarin - Pharmacist Dosing Inpatient   Does not apply q1600   Zinc Oxide   Topical QID    Objective: Vital signs in last 24 hours: Temp:  [97.5 F (36.4 C)-97.6 F (36.4 C)] 97.5 F (36.4 C) (07/11 1427) Pulse Rate:  [61-67] 61 (07/11 1427) Resp:  [16-25] 25 (07/11 1427) BP: (116-140)/(70-75) 140/75 (07/11 1427) SpO2:  [78 %-98 %] 78 % (07/11 1427)  Physical Exam  Constitutional: He is oriented to person, place, and time. He appears well-developed and well-nourished. No distress.  HENT:  Mouth/Throat: Oropharynx is clear and moist. No oropharyngeal exudate.  Cardiovascular: Normal rate, regular rhythm and normal heart sounds. Exam reveals no gallop and no  friction rub.  No murmur heard.  Pulmonary/Chest: Effort normal and breath sounds normal. No respiratory distress. He has no wheezes.  Abdominal: Soft. Bowel sounds are normal. He exhibits no distension. There is no tenderness. Watery brown fluid in stool collection. Neurological: He is alert and oriented to person, place, and time.  Skin: Skin is warm and dry. No rash noted. No erythema.  Psychiatric: He has a normal mood and affect. His behavior is normal.    Lab Results Recent Labs    08/14/20 0516 08/15/20 0518 08/16/20 0434  WBC 11.0*  --  14.5*  HGB 15.7  --  13.7  HCT 49.9  --  42.6  NA 141 138 139  K 3.8 3.5 3.4*  CL 113* 114* 113*  CO2 19* 17* 18*  BUN 36* 48* 55*  CREATININE 1.60* 2.05* 1.92*     Microbiology: reviewed Studies/Results: No results found.   Assessment/Plan: EPEC, watery diarrhea = continue with supportive care. He has finished 5-6 doses of azithromycin no longer needed  Acute on chronic kidney disease= likely from volume loss from diarrhea  Renal abscess with mdro esbl ecoli = initial plan was to place on longer course of abtx. However he is intolerant of everacycline. Will stop temporarily for him to have symptomatic improvement of diarrhea. Will need to consider to figure out if still has residual infection despite  10-12 wk of treatment -since he is intolerant of abtx side effects.  [facility] for Infectious Diseases Cell: [phone] Pager: [phone]  08/16/2020, 6:44 PM

## 2020-08-16 NOTE — Progress Notes (Signed)
PROGRESS NOTE  [name]  BMW:[id number] DOB: 1932/02/03 DOA: 08/08/2020 PCP: Shirline Frees, MD  Outpatient Specialists: [facility] Brief Narrative: 87 y.o. male with medical history significant for CAD, biventricular cardiac pacemaker, s/p TAVR, persistent atrial fibrillation on Coumadin, combined systolic and diastolic HF, HLD, HTN, OSA on CPAP, hx of DVT/PE s/p IVC, hx of prostate cancer s/p radical prostatectomy, recent right renal subcapsular abscess secondary to ESBL in the setting of nonobstructing nephrolithiasis (followed by urology and ID as an outpatient) treated with 12 weeks of IV ertapenem (completed 08/08/2020), recently started on oral vancomycin from 08/06/2020 by ID for C. difficile diarrhea presented with worsening diarrhea along with vomiting.  On presentation, he was afebrile and normotensive with no leukocytosis. Enteropathogenic E. coli was found in stool for which azithromycin has been given. Antimotility agents and cholestyramine added. Rectal tube was required. Foley also inserted due to incontinence causing severe skin irritation.   Assessment & Plan: Principal Problem:   Diarrhea Active Problems:   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)   Long term (current) use of anticoagulants   CAD (coronary artery disease), native coronary artery   Persistent atrial fibrillation (HCC)   Cardiac pacemaker in situ   CKD (chronic kidney disease), stage III (HCC)   S/P TAVR (transcatheter aortic valve replacement)   OSA on CPAP   Presence of IVC filter   Renal abscess   Infectious diarrhea   ESBL (extended spectrum beta-lactamase) producing bacteria infection  Enteropathogenic E. coli with severe and persistent diarrhea: C. diff negative.  - Due to severity of symptoms and old age/risk of worsening, azithromycin was given for total 5 days. - Continue lomotil, cholestyramine. Decreased lomotil dosing. Continue lactobacillus. Stopped vitamin C -  Needs barrier cream applied liberally and often. Rectal tube reinserted 7/8 for relief of severe MASD.  - Given persistence, we will need to entertain thought of PICC/TPN. Per family request, will ask for palliative care evaluation (known to the service from last hospitalization per wife) to continue [place] discussions and to assist with any further symptom control measures. Pt's po intake starting to increase today.  - Increase IVF rate as below due to continued high output.  MASD, candidiasis:  - Nystatin powder to be applied liberally, attempt to keep areas dry. - Foley catheter inserted due to severe MASD. Since that time, symptoms greatly improved.  - WOC consulted for additional recommendations.    ESBL E. coli renal abscess: Remains on repeat imaging. Completed 12 weeks of IV ertapenem on 08/08/2020. - Continue eravacycline per ID. WBC persistently elevated.  - Urology follow up planned   Vomiting:  - Antiemetic prn - Monitor closely. Improving. No abdominal pain or tenderness on exam, nondistended, will forgo CT for now.   Remote history of PE/DVT Persistent atrial fibrillation History of bioprosthetic TAVR - Continue coumadin per pharmacy, daily INR checks.    Supratherapeutic INR: In setting of worsening nutritional status. Mild redness to liquid stools has cleared up.  - Discussed daily with family, will continue holding vitamin K at this time. Of course, we're holding coumadin, and continuing daily INR checks while monitoring closely for bleeding. Hgb remains wnl.   Hypomagnesemia:  - Supplemented and improved.   Hypokalemia:  - Supplement in IVF.   Heart block s/p pacemaker, CAD s/p PCI:  - Continue imdur, statin, coumadin - Outpatient follow-up with cardiology  AKI on stage IIIa CKD:  - Avoid nephrotoxins - Augment IVF further to LR at 150cc/hr  as ~4L stool was reported 7/10.    Leukocytosis - Monitoring  Hypertension - Continue amlodipine and  imdur  Hyperlipidemia - Continue statin  OSA - Continue CPAP at bedtime   Generalized deconditioning - PT eval  Obesity: Estimated body mass index is 30.64 kg/m as calculated from the following:   Height as of this encounter: 5\' 8"  (1.727 m).   Weight as of this encounter: 91.4 kg.  DVT prophylaxis: Coumadin Code Status: Full Family Communication: Wife and daughter at bedside   Disposition Plan:  Status is: Inpatient  Remains inpatient appropriate because:Inpatient level of care appropriate due to severity of illness  Dispo: The patient is from: Home              Anticipated d/c is to: Home              Patient currently is not medically stable to d/c.   Difficult to place patient No  Consultants:  ID  Procedures:  Rectal tube reinserted 7/8 Foley catheter 7/8  Antimicrobials: Azithromycin  Eravacycline  Subjective: In higher spirits today, has tolerated several po challenges over past 12 hours. No vomiting. Stool remains liquid with no gross blood today. No abdominal discomfort.   Objective: Vitals:   08/15/20 1338 08/15/20 2037 08/16/20 0628 08/16/20 1427  BP: 128/65 134/72 116/70 140/75  Pulse: 71 67 66 61  Resp: 17 18 16  (!) 25  Temp: 98.1 F (36.7 C) 97.6 F (36.4 C) 97.6 F (36.4 C) (!) 97.5 F (36.4 C)  TempSrc:  Oral Oral Oral  SpO2: 93% 98% 97% (!) 78%  Weight:      Height:        Intake/Output Summary (Last 24 hours) at 08/16/2020 1545 Last data filed at 08/16/2020 0300 Gross per 24 hour  Intake 2239.31 ml  Output 2400 ml  Net -160.69 ml   Filed Weights   08/11/20 0500 08/12/20 0500 08/13/20 0440  Weight: 92.2 kg 93 kg 91.4 kg   Gen: Pleasant elderly male in no distress Pulm: Nonlabored breathing room air. Clear. CV: Regular rate and rhythm. No murmur, rub, or gallop. No JVD, no significant dependent edema. GI: Abdomen soft, not at all tender, non-distended, with normoactive bowel sounds.  Ext: Warm, no deformities Skin: Penis  remains erythematous without induration, scrotal swelling and redness decreased from prior. Some run around stool on perianal skin though erythema is decreased in intensity, remains widespread. 2 small areas of abrasion of very thin irritated skin are noted on buttocks. No induration or fluctuance or exudate or bleeding.  Neuro: Alert and oriented. HIH without focal neurological deficits. Psych: Judgement and insight appear fair. Mood euthymic & affect congruent. Behavior is appropriate.    Data Reviewed: I have personally reviewed following labs and imaging studies  CBC: Recent Labs  Lab 08/10/20 0433 08/11/20 0421 08/14/20 0516 08/16/20 0434  WBC 12.3* 12.9* 11.0* 14.5*  NEUTROABS 6.1 6.3 4.8  --   HGB 12.7* 12.4* 15.7 13.7  HCT 40.6 39.6 49.9 42.6  MCV 82.9 82.5 81.9 81.1  PLT 172 170 185 161   Basic Metabolic Panel: Recent Labs  Lab 08/11/20 0421 08/12/20 0444 08/14/20 0516 08/15/20 0518 08/16/20 0434  NA 138 142 141 138 139  K 4.2 4.7 3.8 3.5 3.4*  CL 110 114* 113* 114* 113*  CO2 22 21* 19* 17* 18*  GLUCOSE 108* 148* 157* 146* 101*  BUN 19 21 36* 48* 55*  CREATININE 1.40* 1.33* 1.60* 2.05* 1.92*  CALCIUM 9.3 9.9  10.0 9.7 9.4  MG 1.7 1.7 1.6* 1.5* 2.3   GFR: Estimated Creatinine Clearance: 29.8 mL/min (A) (by C-G formula based on SCr of 1.92 mg/dL (H)). Liver Function Tests: No results for input(s): AST, ALT, ALKPHOS, BILITOT, PROT, ALBUMIN in the last 168 hours.  No results for input(s): LIPASE, AMYLASE in the last 168 hours. No results for input(s): AMMONIA in the last 168 hours. Coagulation Profile: Recent Labs  Lab 08/12/20 0444 08/13/20 0509 08/14/20 0516 08/15/20 0518 08/16/20 0434  INR 2.7* 2.6* 3.2* 4.2* 4.5*   Cardiac Enzymes: No results for input(s): CKTOTAL, CKMB, CKMBINDEX, TROPONINI in the last 168 hours. BNP (last 3 results) No results for input(s): PROBNP in the last 8760 hours. HbA1C: No results for input(s): HGBA1C in the last 72  hours. CBG: No results for input(s): GLUCAP in the last 168 hours. Lipid Profile: No results for input(s): CHOL, HDL, LDLCALC, TRIG, CHOLHDL, LDLDIRECT in the last 72 hours. Thyroid Function Tests: No results for input(s): TSH, T4TOTAL, FREET4, T3FREE, THYROIDAB in the last 72 hours. Anemia Panel: No results for input(s): VITAMINB12, FOLATE, FERRITIN, TIBC, IRON, RETICCTPCT in the last 72 hours. Urine analysis:    Component Value Date/Time   COLORURINE YELLOW 05/15/2020 2307   APPEARANCEUR CLEAR 05/15/2020 2307   LABSPEC 1.033 (H) 05/15/2020 2307   PHURINE 7.0 05/15/2020 2307   GLUCOSEU NEGATIVE 05/15/2020 2307   HGBUR NEGATIVE 05/15/2020 2307   BILIRUBINUR NEGATIVE 05/15/2020 2307   KETONESUR NEGATIVE 05/15/2020 2307   PROTEINUR NEGATIVE 05/15/2020 2307   UROBILINOGEN 0.2 09/19/2014 1027   NITRITE NEGATIVE 05/15/2020 2307   LEUKOCYTESUR NEGATIVE 05/15/2020 2307   Recent Results (from the past 240 hour(s))  C Difficile Quick Screen w PCR reflex     Status: None   Collection Time: 08/08/20  4:30 PM   Specimen: STOOL  Result Value Ref Range Status   C Diff antigen NEGATIVE NEGATIVE Final   C Diff toxin NEGATIVE NEGATIVE Final   C Diff interpretation No C. difficile detected.  Final    Comment: Performed at [facility], [place] [address], [place], [place] [address]  Gastrointestinal Panel by PCR , Stool     Status: Abnormal   Collection Time: 08/08/20  4:30 PM   Specimen: STOOL  Result Value Ref Range Status   Campylobacter species NOT DETECTED NOT DETECTED Final   Plesimonas shigelloides NOT DETECTED NOT DETECTED Final   Salmonella species NOT DETECTED NOT DETECTED Final   Yersinia enterocolitica NOT DETECTED NOT DETECTED Final   Vibrio species NOT DETECTED NOT DETECTED Final   Vibrio cholerae NOT DETECTED NOT DETECTED Final   Enteroaggregative E coli (EAEC) NOT DETECTED NOT DETECTED Final   Enteropathogenic E coli (EPEC) DETECTED (A) NOT DETECTED Final     Comment: CRITICAL RESULT CALLED TO, READ BACK BY AND VERIFIED WITH: MELISSA HOUSE RN @1408  08/09/20 SCS    Enterotoxigenic E coli (ETEC) NOT DETECTED NOT DETECTED Final   Shiga like toxin producing E coli (STEC) NOT DETECTED NOT DETECTED Final   Shigella/Enteroinvasive E coli (EIEC) NOT DETECTED NOT DETECTED Final   Cryptosporidium NOT DETECTED NOT DETECTED Final   Cyclospora cayetanensis NOT DETECTED NOT DETECTED Final   Entamoeba histolytica NOT DETECTED NOT DETECTED Final   Giardia lamblia NOT DETECTED NOT DETECTED Final   Adenovirus F40/41 NOT DETECTED NOT DETECTED Final   Astrovirus NOT DETECTED NOT DETECTED Final   Norovirus GI/GII NOT DETECTED NOT DETECTED Final   Rotavirus A NOT DETECTED NOT DETECTED Final  Sapovirus (I, II, IV, and V) NOT DETECTED NOT DETECTED Final    Comment: Performed at [facility], [place], [place] [address]  SARS CORONAVIRUS 2 (TAT 6-24 HRS) Nasopharyngeal Nasopharyngeal Swab     Status: None   Collection Time: 08/08/20  8:38 PM   Specimen: Nasopharyngeal Swab  Result Value Ref Range Status   SARS Coronavirus 2 NEGATIVE NEGATIVE Final    Comment: (NOTE) SARS-CoV-2 target nucleic acids are NOT DETECTED.  The SARS-CoV-2 RNA is generally detectable in upper and lower respiratory specimens during the acute phase of infection. Negative results do not preclude SARS-CoV-2 infection, do not rule out co-infections with other pathogens, and should not be used as the sole basis for treatment or other patient management decisions. Negative results must be combined with clinical observations, patient history, and epidemiological information. The expected result is Negative.  Fact Sheet for Patients: SugarRoll.be  Fact Sheet for Healthcare Providers: https://www.woods-mathews.com/  This test is not yet approved or cleared by the [place] FDA and  has been authorized for detection and/or  diagnosis of SARS-CoV-2 by FDA under an Emergency Use Authorization (EUA). This EUA will remain  in effect (meaning this test can be used) for the duration of the COVID-19 declaration under Se ction 564(b)(1) of the Act, 21 U.S.C. section 360bbb-3(b)(1), unless the authorization is terminated or revoked sooner.  Performed at [facility], [place] [address], [place], [place] [address]       Radiology Studies: No results found.  Scheduled Meds:  amLODipine  2.5 mg Oral QHS   Chlorhexidine Gluconate Cloth  6 each Topical Daily   cholestyramine light  4 g Oral Q24H   diphenoxylate-atropine  1 tablet Oral TID   isosorbide mononitrate  15 mg Oral QHS   lactobacillus  1 g Oral TID WC   nystatin   Topical TID   pantoprazole (PROTONIX) IV  40 mg Intravenous Q24H   Warfarin - Pharmacist Dosing Inpatient   Does not apply q1600   Zinc Oxide   Topical QID   Continuous Infusions:  sodium chloride 250 mL (08/12/20 1618)   azithromycin 500 mg (08/16/20 1144)   eravacycline 93 mg (08/16/20 0229)   lactated ringers 150 mL/hr at 08/16/20 1328     LOS: 7 days   Time spent: 35 minutes.  Patrecia Pour, MD [facility] [email or web address] 08/16/2020, 3:45 PM

## 2020-08-17 ENCOUNTER — Inpatient Hospital Stay (HOSPITAL_COMMUNITY): Payer: Medicare Other

## 2020-08-17 ENCOUNTER — Inpatient Hospital Stay: Payer: Self-pay

## 2020-08-17 DIAGNOSIS — I2510 Atherosclerotic heart disease of native coronary artery without angina pectoris: Secondary | ICD-10-CM | POA: Diagnosis not present

## 2020-08-17 DIAGNOSIS — Z515 Encounter for palliative care: Secondary | ICD-10-CM

## 2020-08-17 DIAGNOSIS — N1831 Chronic kidney disease, stage 3a: Secondary | ICD-10-CM | POA: Diagnosis not present

## 2020-08-17 DIAGNOSIS — A09 Infectious gastroenteritis and colitis, unspecified: Secondary | ICD-10-CM | POA: Diagnosis not present

## 2020-08-17 DIAGNOSIS — Z7189 Other specified counseling: Secondary | ICD-10-CM | POA: Diagnosis not present

## 2020-08-17 DIAGNOSIS — A499 Bacterial infection, unspecified: Secondary | ICD-10-CM | POA: Diagnosis not present

## 2020-08-17 DIAGNOSIS — Z950 Presence of cardiac pacemaker: Secondary | ICD-10-CM | POA: Diagnosis not present

## 2020-08-17 LAB — BASIC METABOLIC PANEL
Anion gap: 6 (ref 5–15)
BUN: 50 mg/dL — ABNORMAL HIGH (ref 8–23)
CO2: 17 mmol/L — ABNORMAL LOW (ref 22–32)
Calcium: 9.1 mg/dL (ref 8.9–10.3)
Chloride: 118 mmol/L — ABNORMAL HIGH (ref 98–111)
Creatinine, Ser: 1.43 mg/dL — ABNORMAL HIGH (ref 0.61–1.24)
GFR, Estimated: 47 mL/min — ABNORMAL LOW (ref >60)
Glucose, Bld: 121 mg/dL — ABNORMAL HIGH (ref 70–99)
Potassium: 3.3 mmol/L — ABNORMAL LOW (ref 3.5–5.1)
Sodium: 141 mmol/L (ref 135–145)

## 2020-08-17 LAB — PROTIME-INR
INR: 4.8 (ref 0.8–1.2)
Prothrombin Time: 44.6 seconds — ABNORMAL HIGH (ref 11.4–15.2)

## 2020-08-17 LAB — CBC
HCT: 41.6 % (ref 39.0–52.0)
Hemoglobin: 13.1 g/dL (ref 13.0–17.0)
MCH: 26 pg (ref 26.0–34.0)
MCHC: 31.5 g/dL (ref 30.0–36.0)
MCV: 82.5 fL (ref 80.0–100.0)
Platelets: 127 10*3/uL — ABNORMAL LOW (ref 150–400)
RBC: 5.04 MIL/uL (ref 4.22–5.81)
RDW: 16.5 % — ABNORMAL HIGH (ref 11.5–15.5)
WBC: 13.4 10*3/uL — ABNORMAL HIGH (ref 4.0–10.5)
nRBC: 0 % (ref 0.0–0.2)

## 2020-08-17 LAB — MAGNESIUM: Magnesium: 1.9 mg/dL (ref 1.7–2.4)

## 2020-08-17 MED ORDER — SODIUM CHLORIDE 0.9% FLUSH
10.0000 mL | INTRAVENOUS | Status: DC | PRN
  Administered 2020-08-18: 10 mL

## 2020-08-17 MED ORDER — OXYCODONE HCL 5 MG PO TABS
5.0000 mg | ORAL_TABLET | ORAL | Status: DC | PRN
  Administered 2020-08-19 – 2020-08-23 (×5): 5 mg via ORAL
  Filled 2020-08-17 (×7): qty 1

## 2020-08-17 MED ORDER — POTASSIUM CHLORIDE CRYS ER 10 MEQ PO TBCR
40.0000 meq | EXTENDED_RELEASE_TABLET | Freq: Once | ORAL | Status: AC
  Administered 2020-08-17: 40 meq via ORAL
  Filled 2020-08-17: qty 4

## 2020-08-17 MED ORDER — MELATONIN 3 MG PO TABS
3.0000 mg | ORAL_TABLET | Freq: Once | ORAL | Status: AC
  Administered 2020-08-17: 3 mg via ORAL
  Filled 2020-08-17: qty 1

## 2020-08-17 MED ORDER — HYDROMORPHONE HCL 1 MG/ML IJ SOLN
0.5000 mg | INTRAMUSCULAR | Status: DC | PRN
  Administered 2020-08-20: 0.5 mg via INTRAVENOUS
  Filled 2020-08-17 (×2): qty 0.5

## 2020-08-17 NOTE — Plan of Care (Signed)
  Problem: Clinical Measurements: Goal: Respiratory complications will improve Outcome: Progressing   Problem: Safety: Goal: Ability to remain free from injury will improve Outcome: Progressing   

## 2020-08-17 NOTE — Progress Notes (Signed)
INR is 4.8, on-call provider notified. No other issues at this time. Will continue to monitor. Shirlyn Goltz

## 2020-08-17 NOTE — Progress Notes (Signed)
PT Cancellation Note  Patient Details Name: DARYL BEEHLER MRN: [id number] DOB: [birth year]   Cancelled Treatment:    Reason Eval/Treat Not Completed: Patient at procedure or test/unavailable, PICC line. Spoke with wife. Will try to find an opportunity to sit up tomorrow as patient tolerates.   Claretha Cooper 08/17/2020, 3:13 PM  Tresa Endo PT Acute Rehabilitation Services Pager [phone] Office [phone]

## 2020-08-17 NOTE — Progress Notes (Signed)
Haelei RN notified via secure chat PICC ready to use.

## 2020-08-17 NOTE — Progress Notes (Addendum)
PROGRESS NOTE  [name]  KDT:[id number] DOB: [birth year] DOA: 08/08/2020 PCP: Shirline Frees, MD  Outpatient Specialists: [facility] Brief Narrative: 87 y.o. male with medical history significant for CAD, biventricular cardiac pacemaker, s/p TAVR, persistent atrial fibrillation on Coumadin, combined systolic and diastolic HF, HLD, HTN, OSA on CPAP, hx of DVT/PE s/p IVC, hx of prostate cancer s/p radical prostatectomy, recent right renal subcapsular abscess secondary to ESBL in the setting of nonobstructing nephrolithiasis (followed by urology and ID as an outpatient) treated with 12 weeks of IV ertapenem (completed 08/08/2020), recently started on oral vancomycin from 08/06/2020 by ID for C. difficile diarrhea presented with worsening diarrhea along with vomiting.  On presentation, he was afebrile and normotensive with no leukocytosis. Enteropathogenic E. coli was found in stool for which azithromycin has been given. Antimotility agents and cholestyramine added. Rectal tube was required. Foley also inserted due to incontinence causing severe skin irritation.   Assessment & Plan: Principal Problem:   Diarrhea Active Problems:   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)   Long term (current) use of anticoagulants   CAD (coronary artery disease), native coronary artery   Persistent atrial fibrillation (HCC)   Cardiac pacemaker in situ   CKD (chronic kidney disease), stage III (HCC)   S/P TAVR (transcatheter aortic valve replacement)   OSA on CPAP   Presence of IVC filter   Renal abscess   Infectious diarrhea   ESBL (extended spectrum beta-lactamase) producing bacteria infection  Enteropathogenic E. coli with severe and persistent diarrhea: C. diff negative. Completed course of azithromycin.  - Continue lomotil, cholestyramine. Continue lactobacillus. Stopped vitamin C. - Needs barrier cream applied liberally and often. Rectal tube reinserted 7/8 for relief of severe  MASD.  - Given persistence and difficult IV access and lab draws, will insert PICC. This is both pragmatic and serves a comfort measures function.   - Continue increased IVF with ongoing GI losses.   MASD, candidiasis:  - Nystatin powder to be applied liberally, attempt to keep areas dry. - Foley catheter inserted due to severe MASD. Since that time, symptoms greatly improved.  - WOC consulted for additional recommendations.    ESBL E. coli renal abscess: Remains on repeat imaging. Completed 12 weeks of IV ertapenem on 08/08/2020. - DC abx per ID. He did not appear to tolerate this very well. - Urology follow up planned   Vomiting:  - Antiemetic prn - Monitor closely. Improving. No abdominal pain or tenderness on exam, nondistended, will forgo CT for now.   Remote history of PE/DVT Persistent atrial fibrillation History of bioprosthetic TAVR - Continue coumadin per pharmacy, daily INR checks.    Supratherapeutic INR: In setting of worsening nutritional status. Mild redness to liquid stools has cleared up.  - Continue holding vitamin K in the absence of bleeding. Hgb remains wnl.   Hypomagnesemia:  - Supplemented and improved.   Acute hospital delirium: Worsened by dilaudid.  - Decrease dilaudid dose, attempt to use oxycodone instead. Tramadol previously tried without any improvement.   Hypokalemia:  - Supplement again today and monitor. Mg 1.9.   Heart block s/p pacemaker, CAD s/p PCI:  - Continue imdur, statin, coumadin - Outpatient follow-up with cardiology  AKI on stage IIIa CKD:  - Avoid nephrotoxins - Continue IVF at 150cc/hr. No evidence of overload and high output from rectal tube continues.  - Pt and wife are very clear they would never pursue hemodialysis in the event of renal failure. No issues  with placing PICC.   Leukocytosis - Monitoring  Thrombocytopenia:  - Monitor in AM  Hypertension - Continue amlodipine and imdur  Hyperlipidemia - Continue  statin  OSA - Continue CPAP at bedtime   Generalized deconditioning - PT eval  Obesity: Estimated body mass index is 31 kg/m as calculated from the following:   Height as of this encounter: 5\' 8"  (1.727 m).   Weight as of this encounter: 92.5 kg.  DVT prophylaxis: Coumadin Code Status: Full Family Communication: Wife at bedside   Disposition Plan:  Status is: Inpatient  Remains inpatient appropriate because:Inpatient level of care appropriate due to severity of illness  Dispo: The patient is from: Home              Anticipated d/c is to: Home              Patient currently is not medically stable to d/c.   Difficult to place patient No  Consultants:  ID Palliative care   Procedures:  Rectal tube reinserted 7/8 Foley catheter 7/8  Antimicrobials: Azithromycin  Eravacycline  Subjective: Got dilaudid 1mg  last night and really got quite confused which has somewhat improved, though he is in and out of orientation. No fever. He denies abdominal pain. No blood in stool or urine or elsewhere. No vomiting for a couple days and tolerating some po intake without nausea.   Objective: Vitals:   08/16/20 0628 08/16/20 1427 08/16/20 2119 08/17/20 0359  BP: 116/70 140/75 (!) 131/58 139/65  Pulse: 66 61 (!) 59 64  Resp: 16 (!) 25 20 20   Temp: 97.6 F (36.4 C) (!) 97.5 F (36.4 C) 98.2 F (36.8 C) 97.7 F (36.5 C)  TempSrc: Oral Oral Oral Oral  SpO2: 97% (!) 78% 97% 100%  Weight:    92.5 kg  Height:        Intake/Output Summary (Last 24 hours) at 08/17/2020 1609 Last data filed at 08/17/2020 0400 Gross per 24 hour  Intake --  Output 1800 ml  Net -1800 ml   Filed Weights   08/12/20 0500 08/13/20 0440 08/17/20 0359  Weight: 93 kg 91.4 kg 92.5 kg   Gen: Pleasant, elderly, frail male in no distress Pulm: Nonlabored breathing room air. Clear. CV: Regular rate and rhythm. No murmur, rub, or gallop. No JVD, no pitting dependent edema. GI: Abdomen somewhat more bloated but  soft, non-tender, non-distended, with normoactive bowel sounds.  Ext: Warm, no deformities Skin: No new rashes, lesions or ulcers on visualized skin. Perineum not examined today as staff had just gotten him situated/turned on right side and propped there. Reportedly improving from prior exams (well known to RN). Right and left forearms slightly erythematous at former sites of PIV. No bleeding or new bruising.  Neuro: Alert and mostly oriented, very HOH, no focal neurological deficits. Psych: Judgement and insight appear marginal. Mood euthymic & affect congruent. Behavior is appropriate.    Data Reviewed: I have personally reviewed following labs and imaging studies  CBC: Recent Labs  Lab 08/11/20 0421 08/14/20 0516 08/16/20 0434 08/17/20 0453  WBC 12.9* 11.0* 14.5* 13.4*  NEUTROABS 6.3 4.8  --   --   HGB 12.4* 15.7 13.7 13.1  HCT 39.6 49.9 42.6 41.6  MCV 82.5 81.9 81.1 82.5  PLT 170 185 161 119*   Basic Metabolic Panel: Recent Labs  Lab 08/12/20 0444 08/14/20 0516 08/15/20 0518 08/16/20 0434 08/17/20 0453  NA 142 141 138 139 141  K 4.7 3.8 3.5 3.4* 3.3*  CL  114* 113* 114* 113* 118*  CO2 21* 19* 17* 18* 17*  GLUCOSE 148* 157* 146* 101* 121*  BUN 21 36* 48* 55* 50*  CREATININE 1.33* 1.60* 2.05* 1.92* 1.43*  CALCIUM 9.9 10.0 9.7 9.4 9.1  MG 1.7 1.6* 1.5* 2.3 1.9   GFR: Estimated Creatinine Clearance: 40.2 mL/min (A) (by C-G formula based on SCr of 1.43 mg/dL (H)). Liver Function Tests: No results for input(s): AST, ALT, ALKPHOS, BILITOT, PROT, ALBUMIN in the last 168 hours.  No results for input(s): LIPASE, AMYLASE in the last 168 hours. No results for input(s): AMMONIA in the last 168 hours. Coagulation Profile: Recent Labs  Lab 08/13/20 0509 08/14/20 0516 08/15/20 0518 08/16/20 0434 08/17/20 0453  INR 2.6* 3.2* 4.2* 4.5* 4.8*   Cardiac Enzymes: No results for input(s): CKTOTAL, CKMB, CKMBINDEX, TROPONINI in the last 168 hours. BNP (last 3 results) No  results for input(s): PROBNP in the last 8760 hours. HbA1C: No results for input(s): HGBA1C in the last 72 hours. CBG: No results for input(s): GLUCAP in the last 168 hours. Lipid Profile: No results for input(s): CHOL, HDL, LDLCALC, TRIG, CHOLHDL, LDLDIRECT in the last 72 hours. Thyroid Function Tests: No results for input(s): TSH, T4TOTAL, FREET4, T3FREE, THYROIDAB in the last 72 hours. Anemia Panel: No results for input(s): VITAMINB12, FOLATE, FERRITIN, TIBC, IRON, RETICCTPCT in the last 72 hours. Urine analysis:    Component Value Date/Time   COLORURINE YELLOW 05/15/2020 2307   APPEARANCEUR CLEAR 05/15/2020 2307   LABSPEC 1.033 (H) 05/15/2020 2307   PHURINE 7.0 05/15/2020 2307   GLUCOSEU NEGATIVE 05/15/2020 2307   HGBUR NEGATIVE 05/15/2020 2307   BILIRUBINUR NEGATIVE 05/15/2020 2307   KETONESUR NEGATIVE 05/15/2020 2307   PROTEINUR NEGATIVE 05/15/2020 2307   UROBILINOGEN 0.2 09/19/2014 1027   NITRITE NEGATIVE 05/15/2020 2307   LEUKOCYTESUR NEGATIVE 05/15/2020 2307   Recent Results (from the past 240 hour(s))  C Difficile Quick Screen w PCR reflex     Status: None   Collection Time: 08/08/20  4:30 PM   Specimen: STOOL  Result Value Ref Range Status   C Diff antigen NEGATIVE NEGATIVE Final   C Diff toxin NEGATIVE NEGATIVE Final   C Diff interpretation No C. difficile detected.  Final    Comment: Performed at [facility], [place] [address], [place],  [address]  Gastrointestinal Panel by PCR , Stool     Status: Abnormal   Collection Time: 08/08/20  4:30 PM   Specimen: STOOL  Result Value Ref Range Status   Campylobacter species NOT DETECTED NOT DETECTED Final   Plesimonas shigelloides NOT DETECTED NOT DETECTED Final   Salmonella species NOT DETECTED NOT DETECTED Final   Yersinia enterocolitica NOT DETECTED NOT DETECTED Final   Vibrio species NOT DETECTED NOT DETECTED Final   Vibrio cholerae NOT DETECTED NOT DETECTED Final   Enteroaggregative E coli  (EAEC) NOT DETECTED NOT DETECTED Final   Enteropathogenic E coli (EPEC) DETECTED (A) NOT DETECTED Final    Comment: CRITICAL RESULT CALLED TO, READ BACK BY AND VERIFIED WITH: MELISSA HOUSE RN @1408  08/09/20 SCS    Enterotoxigenic E coli (ETEC) NOT DETECTED NOT DETECTED Final   Shiga like toxin producing E coli (STEC) NOT DETECTED NOT DETECTED Final   Shigella/Enteroinvasive E coli (EIEC) NOT DETECTED NOT DETECTED Final   Cryptosporidium NOT DETECTED NOT DETECTED Final   Cyclospora cayetanensis NOT DETECTED NOT DETECTED Final   Entamoeba histolytica NOT DETECTED NOT DETECTED Final   Giardia lamblia NOT DETECTED NOT DETECTED Final  Adenovirus F40/41 NOT DETECTED NOT DETECTED Final   Astrovirus NOT DETECTED NOT DETECTED Final   Norovirus GI/GII NOT DETECTED NOT DETECTED Final   Rotavirus A NOT DETECTED NOT DETECTED Final   Sapovirus (I, II, IV, and V) NOT DETECTED NOT DETECTED Final    Comment: Performed at [facility], [address] [place], [place] [address]  SARS CORONAVIRUS 2 (TAT 6-24 HRS) Nasopharyngeal Nasopharyngeal Swab     Status: None   Collection Time: 08/08/20  8:38 PM   Specimen: Nasopharyngeal Swab  Result Value Ref Range Status   SARS Coronavirus 2 NEGATIVE NEGATIVE Final    Comment: (NOTE) SARS-CoV-2 target nucleic acids are NOT DETECTED.  The SARS-CoV-2 RNA is generally detectable in upper and lower respiratory specimens during the acute phase of infection. Negative results do not preclude SARS-CoV-2 infection, do not rule out co-infections with other pathogens, and should not be used as the sole basis for treatment or other patient management decisions. Negative results must be combined with clinical observations, patient history, and epidemiological information. The expected result is Negative.  Fact Sheet for Patients: SugarRoll.be  Fact Sheet for Healthcare Providers: https://www.woods-mathews.com/  This  test is not yet approved or cleared by the [place] FDA and  has been authorized for detection and/or diagnosis of SARS-CoV-2 by FDA under an Emergency Use Authorization (EUA). This EUA will remain  in effect (meaning this test can be used) for the duration of the COVID-19 declaration under Se ction 564(b)(1) of the Act, 21 U.S.C. section 360bbb-3(b)(1), unless the authorization is terminated or revoked sooner.  Performed at [facility], [place] [address], [place], [place] [address]       Radiology Studies: DG CHEST PORT 1 VIEW  Result Date: 08/17/2020 CLINICAL DATA:  PICC line placement EXAM: PORTABLE CHEST 1 VIEW COMPARISON:  07/26/2020 FINDINGS: Right PICC line is in place with the tip in the SVC. Left pacer remains in place, unchanged. Heart is normal size. Lungs clear. No effusions or acute bony abnormality. IMPRESSION: Right PICC line tip in the SVC. No active cardiopulmonary disease. Electronically Signed   By: Rolm Baptise M.D.   On: 08/17/2020 15:53   [place] EKG SITE RITE  Result Date: 08/17/2020 If Site Rite image not attached, placement could not be confirmed due to current cardiac rhythm.   Scheduled Meds:  amLODipine  2.5 mg Oral QHS   Chlorhexidine Gluconate Cloth  6 each Topical Daily   cholestyramine light  4 g Oral Q24H   diphenoxylate-atropine  1 tablet Oral TID   isosorbide mononitrate  15 mg Oral QHS   lactobacillus  1 g Oral TID WC   nystatin   Topical TID   pantoprazole (PROTONIX) IV  40 mg Intravenous Q24H   Warfarin - Pharmacist Dosing Inpatient   Does not apply q1600   Zinc Oxide   Topical QID   Continuous Infusions:  sodium chloride 250 mL (08/12/20 1618)   lactated ringers 150 mL/hr at 08/17/20 0356     LOS: 8 days   Time spent: 35 minutes.  Patrecia Pour, MD [facility] [email or web address] 08/17/2020, 4:09 PM

## 2020-08-17 NOTE — Progress Notes (Signed)
ANTICOAGULATION CONSULT NOTE - Follow Up Consult  Pharmacy Consult for warfarin Indication: hx atrial fibrillation, pulmonary embolus, and DVT  Allergies  Allergen Reactions   Darifenacin Nausea Only and Other (See Comments)    dry mouth and dizziness ENABLEX Dizziness Pt denies   Codeine Other (See Comments)   Sulfamethoxazole-Trimethoprim Other (See Comments)   Lisinopril Cough    Pt denies    Patient Measurements: Height: 5\' 8"  (172.7 cm) Weight: 92.5 kg (203 lb 14.4 oz) IBW/kg (Calculated) : 68.4 Heparin Dosing Weight:   Vital Signs: Temp: 97.7 F (36.5 C) (07/12 0359) Temp Source: Oral (07/12 0359) BP: 139/65 (07/12 0359) Pulse Rate: 64 (07/12 0359)  Labs: Recent Labs    08/15/20 0518 08/16/20 0434 08/17/20 0453  HGB  --  13.7 13.1  HCT  --  42.6 41.6  PLT  --  161 127*  LABPROT 40.3* 42.5* 44.6*  INR 4.2* 4.5* 4.8*  CREATININE 2.05* 1.92* 1.43*     Estimated Creatinine Clearance: 40.2 mL/min (A) (by C-G formula based on SCr of 1.43 mg/dL (H)).   Medications:  PTA warfarin regimen: 2.5mg  daily except 1.25mg  on Wed and Sat  Assessment: Patient is an 87 y.o M with hx DVT/PE (s/p IVC filter) and afib on warfarin PTA, presented to the ED on 7/3 with c/o diarrhea and vomiting.  Warfarin resumed on admission.  Today, 08/17/2020: - INR is supra-therapeutic at 4.8 - hgb 13.1, plts 127K - drug-drug intxns: being on abx can make pt more sensitive to warfarin (azithromycin and eravacycline) - on regular diet   Goal of Therapy:  INR 2-3 Monitor platelets by anticoagulation protocol: Yes   Plan:  - hold warfarin dose today d/t elevated INR - daily INR - monitor for s/sx bleeding   Royetta Asal, PharmD, BCPS 08/17/2020 9:55 AM

## 2020-08-17 NOTE — Progress Notes (Signed)
Peripherally Inserted Central Catheter Placement  The IV Nurse has discussed with the patient and/or persons authorized to consent for the patient, the purpose of this procedure and the potential benefits and risks involved with this procedure.  The benefits include less needle sticks, lab draws from the catheter, and the patient may be discharged home with the catheter. Risks include, but not limited to, infection, bleeding, blood clot (thrombus formation), and puncture of an artery; nerve damage and irregular heartbeat and possibility to perform a PICC exchange if needed/ordered by physician.  Alternatives to this procedure were also discussed.  Bard Power PICC patient education guide, fact sheet on infection prevention and patient information card has been provided to patient /or left at bedside.    PICC Placement Documentation  PICC Double Lumen 77/37/36 PICC Right Basilic 40 cm 0 cm (Active)  Indication for Insertion or Continuance of Line Poor Vasculature-patient has had multiple peripheral attempts or PIVs lasting less than 24 hours;Prolonged intravenous therapies 08/17/20 1505  Exposed Catheter (cm) 0 cm 08/17/20 1505  Site Assessment Clean;Dry;Intact 08/17/20 1505  Lumen #1 Status Flushed;Blood return noted;Saline locked 08/17/20 1505  Lumen #2 Status Flushed;Saline locked;Blood return noted 08/17/20 1505  Dressing Type Transparent 08/17/20 1505  Dressing Status Clean;Dry;Intact 08/17/20 1505  Antimicrobial disc in place? Yes 08/17/20 1505  Safety Lock Not Applicable 68/15/94 7076  Line Care Connections checked and tightened 08/17/20 1505  Line Adjustment (NICU/IV Team Only) No 08/17/20 1505  Dressing Intervention New dressing 08/17/20 1505  Dressing Change Due 08/24/20 08/17/20 [place], Nicolette Bang 08/17/2020, 3:06 PM

## 2020-08-17 NOTE — Progress Notes (Signed)
Patient with complaints of discomfort at PICC insertion site as well as at elbow level and near axilla, reports poking sensation at lower edge of dressing.  Site without bruising or redness, not swollen.  Tender with palpation at insertion site, as well as at elbow and near axilla.  Tape noted to be tightly placed at bottom of dressing loosened and removed.  Patient states that this does relieve some of the discomfort.  Patient did require PICC nurse to raise arm during insertion due to line going up the neck but eventually able to be threaded into SVC.  Patient states that while still tender discomfort has improved.  Bedside RN advised to reconsult IV team if pain worsens.  If indicated, will change dressing at that time.

## 2020-08-18 ENCOUNTER — Telehealth: Payer: Self-pay | Admitting: Cardiology

## 2020-08-18 DIAGNOSIS — A499 Bacterial infection, unspecified: Secondary | ICD-10-CM | POA: Diagnosis not present

## 2020-08-18 DIAGNOSIS — Z7189 Other specified counseling: Secondary | ICD-10-CM | POA: Diagnosis not present

## 2020-08-18 DIAGNOSIS — Z515 Encounter for palliative care: Secondary | ICD-10-CM | POA: Diagnosis not present

## 2020-08-18 DIAGNOSIS — A09 Infectious gastroenteritis and colitis, unspecified: Secondary | ICD-10-CM | POA: Diagnosis not present

## 2020-08-18 LAB — CBC
HCT: 38.2 % — ABNORMAL LOW (ref 39.0–52.0)
Hemoglobin: 12.5 g/dL — ABNORMAL LOW (ref 13.0–17.0)
MCH: 26.2 pg (ref 26.0–34.0)
MCHC: 32.7 g/dL (ref 30.0–36.0)
MCV: 79.9 fL — ABNORMAL LOW (ref 80.0–100.0)
Platelets: 121 10*3/uL — ABNORMAL LOW (ref 150–400)
RBC: 4.78 MIL/uL (ref 4.22–5.81)
RDW: 16.6 % — ABNORMAL HIGH (ref 11.5–15.5)
WBC: 11.4 10*3/uL — ABNORMAL HIGH (ref 4.0–10.5)
nRBC: 0 % (ref 0.0–0.2)

## 2020-08-18 LAB — BASIC METABOLIC PANEL
Anion gap: 4 — ABNORMAL LOW (ref 5–15)
BUN: 42 mg/dL — ABNORMAL HIGH (ref 8–23)
CO2: 21 mmol/L — ABNORMAL LOW (ref 22–32)
Calcium: 9 mg/dL (ref 8.9–10.3)
Chloride: 117 mmol/L — ABNORMAL HIGH (ref 98–111)
Creatinine, Ser: 1.17 mg/dL (ref 0.61–1.24)
GFR, Estimated: >60 mL/min (ref >60)
Glucose, Bld: 101 mg/dL — ABNORMAL HIGH (ref 70–99)
Potassium: 3.4 mmol/L — ABNORMAL LOW (ref 3.5–5.1)
Sodium: 142 mmol/L (ref 135–145)

## 2020-08-18 LAB — PROTIME-INR
INR: 5.1 (ref 0.8–1.2)
Prothrombin Time: 47.2 seconds — ABNORMAL HIGH (ref 11.4–15.2)

## 2020-08-18 MED ORDER — DIPHENOXYLATE-ATROPINE 2.5-0.025 MG PO TABS
1.0000 | ORAL_TABLET | Freq: Four times a day (QID) | ORAL | Status: DC
  Administered 2020-08-18 – 2020-08-20 (×5): 1 via ORAL
  Filled 2020-08-18 (×7): qty 1

## 2020-08-18 NOTE — Progress Notes (Signed)
I spent time with [name] to provide emotional and spiritual support.  He has a very strong faith and is at peace with whatever happens.   He was very appreciative of the visit and requested prayer.  I provided prayer and ministry of presence.  [place], Bcc Pager, [phone] 4:32 PM

## 2020-08-18 NOTE — Progress Notes (Signed)
ANTICOAGULATION CONSULT NOTE - Follow Up Consult  Pharmacy Consult for warfarin Indication: hx atrial fibrillation, pulmonary embolus, and DVT  Allergies  Allergen Reactions   Darifenacin Nausea Only and Other (See Comments)    dry mouth and dizziness ENABLEX Dizziness Pt denies   Codeine Other (See Comments)   Sulfamethoxazole-Trimethoprim Other (See Comments)   Lisinopril Cough    Pt denies    Patient Measurements: Height: 5\' 8"  (172.7 cm) Weight: 97.1 kg (214 lb 2.7 oz) IBW/kg (Calculated) : 68.4 Heparin Dosing Weight:   Vital Signs: Temp: 98.3 F (36.8 C) (07/13 0449) Temp Source: Oral (07/13 0449) BP: 120/58 (07/13 0449) Pulse Rate: 60 (07/13 0449)  Labs: Recent Labs    08/16/20 0434 08/17/20 0453 08/18/20 0340  HGB 13.7 13.1 12.5*  HCT 42.6 41.6 38.2*  PLT 161 127* 121*  LABPROT 42.5* 44.6* 47.2*  INR 4.5* 4.8* 5.1*  CREATININE 1.92* 1.43* 1.17     Estimated Creatinine Clearance: 50.3 mL/min (by C-G formula based on SCr of 1.17 mg/dL).   Medications:  PTA warfarin regimen: 2.5mg  daily except 1.25mg  on Wed and Sat  Assessment: Patient is an 87 y.o M with hx DVT/PE (s/p IVC filter) and afib on warfarin PTA, presented to the ED on 7/3 with c/o diarrhea and vomiting.  Warfarin resumed on admission.  Today, 08/18/2020: - INR is supra-therapeutic at 5.1. LD of warfarin was on 7/9  - hgb 12.5, plts 127K - drug-drug intxns: antibiotics have been stopped  - on regular diet   Goal of Therapy:  INR 2-3 Monitor platelets by anticoagulation protocol: Yes   Plan:  - hold warfarin dose today d/t elevated INR - daily INR - monitor for s/sx bleeding   Royetta Asal, PharmD, BCPS 08/18/2020 9:06 AM

## 2020-08-18 NOTE — Telephone Encounter (Signed)
Left voicemail message that they may have delay in signing but that if not received in the next day or so then call back to follow up on them.

## 2020-08-18 NOTE — Progress Notes (Signed)
[name]  TTS:[id number] DOB: [birth year] DOA: 08/08/2020 PCP: Shirline Frees, MD    Brief Narrative:  87 year old with a history of CAD, biventricular cardiac pacemaker, status post TAVR, persistent atrial fibrillation on Coumadin, combined systolic and diastolic CHF, HLD, HTN, OSA, DVT/PE status post IVC filter, prostate cancer status post radical prostatectomy, and a recent right renal subcapsular abscess secondary to ESBL (treatment complicated by C. difficile colitis) who presented to the ED with worsening diarrhea and vomiting.  Significant Events:  After admission enteropathogenic E. coli was noted in his stool samples and he was treated with azithromycin.  Antimotility agents and cholestyramine were utilized.  Corrective tube was required.  Foley catheter had to be inserted due to incontinence leading to severe perineal skin irritation.  Consultants:  Palliative care Infectious disease  Code Status: FULL CODE  Antimicrobials:  Azithromycin Eravacycline  DVT prophylaxis: Warfarin  Subjective: Afebrile.  Vital signs stable.  Reports ongoing pain in buttocks and perineum without change.  No shortness of breath or significant abdominal pain.  No chest pain.  The patient is alert and quite pleasant.  Assessment & Plan:  Enteropathogenic E. coli with severe and persistent diarrhea C. difficile negative -completed course of azithromycin -continue Lomotil and cholestyramine -barrier cream to be applied liberally and often -rectal tube reinserted 7/8  Moisture associated skin damage with candidiasis Nystatin powder being utilized -Foley catheter has resulted in significant improvement -must continue Foley for now to allow ongoing wound healing  Perinephric abscess ESBL E. Coli Completed 12 weeks of IV ertapenem 08/08/2020 -now off antibiotics per ID  Acute kidney injury on CKD stage IIIa Creatinine steadily improving  Recent Labs  Lab 08/14/20 0516 08/15/20 0518  08/16/20 0434 08/17/20 0453 08/18/20 0340  CREATININE 1.60* 2.05* 1.92* 1.43* 1.17    Frequent vomiting Appears to have resolved for now  Remote history of PE/DVT Anticoagulated with Coumadin  Persistent atrial fibrillation Continue Coumadin therapy  Bioprosthetic TAVR Continue Coumadin  Hypomagnesemia Corrected with supplementation  Hypokalemia Supplement as needed and follow trend  Acute hospital delirium Appears much improved at this time -continue to monitor closely  Thrombocytopenia Due to above -platelet count stable  HTN  HLD  OSA  Obesity - Body mass index is 32.56 kg/m.    Family Communication: Spoke with wife at bedside at length Status is: Inpatient  Remains inpatient appropriate because:Inpatient level of care appropriate due to severity of illness  Dispo: The patient is from: Home              Anticipated d/c is to:  unclear              Patient currently is not medically stable to d/c.   Difficult to place patient No  Objective: Blood pressure (!) 120/58, pulse 60, temperature 98.3 F (36.8 C), temperature source Oral, resp. rate 20, height 5\' 8"  (1.727 m), weight 97.1 kg, SpO2 98 %.  Intake/Output Summary (Last 24 hours) at 08/18/2020 1147 Last data filed at 08/18/2020 0448 Gross per 24 hour  Intake --  Output 850 ml  Net -850 ml   Filed Weights   08/13/20 0440 08/17/20 0359 08/18/20 0449  Weight: 91.4 kg 92.5 kg 97.1 kg    Examination: General: No acute respiratory distress Lungs: Clear to auscultation bilaterally without wheezes or crackles Cardiovascular: Regular rate and rhythm without murmur gallop or rub normal S1 and S2 Abdomen: Nontender, nondistended, soft, bowel sounds positive, no rebound, no ascites, no appreciable mass Extremities: No significant cyanosis,  clubbing, or edema bilateral lower extremities  CBC: Recent Labs  Lab 08/14/20 0516 08/16/20 0434 08/17/20 0453 08/18/20 0340  WBC 11.0* 14.5* 13.4* 11.4*   NEUTROABS 4.8  --   --   --   HGB 15.7 13.7 13.1 12.5*  HCT 49.9 42.6 41.6 38.2*  MCV 81.9 81.1 82.5 79.9*  PLT 185 161 127* 371*   Basic Metabolic Panel: Recent Labs  Lab 08/15/20 0518 08/16/20 0434 08/17/20 0453 08/18/20 0340  NA 138 139 141 142  K 3.5 3.4* 3.3* 3.4*  CL 114* 113* 118* 117*  CO2 17* 18* 17* 21*  GLUCOSE 146* 101* 121* 101*  BUN 48* 55* 50* 42*  CREATININE 2.05* 1.92* 1.43* 1.17  CALCIUM 9.7 9.4 9.1 9.0  MG 1.5* 2.3 1.9  --    GFR: Estimated Creatinine Clearance: 50.3 mL/min (by C-G formula based on SCr of 1.17 mg/dL).  Liver Function Tests: No results for input(s): AST, ALT, ALKPHOS, BILITOT, PROT, ALBUMIN in the last 168 hours. No results for input(s): LIPASE, AMYLASE in the last 168 hours. No results for input(s): AMMONIA in the last 168 hours.  Coagulation Profile: Recent Labs  Lab 08/14/20 0516 08/15/20 0518 08/16/20 0434 08/17/20 0453 08/18/20 0340  INR 3.2* 4.2* 4.5* 4.8* 5.1*    HbA1C: Hgb A1c MFr Bld  Date/Time Value Ref Range Status  03/18/2018 07:46 PM 6.3 (H) 4.8 - 5.6 % Final    Comment:    (NOTE) Pre diabetes:          5.7%-6.4% Diabetes:              >6.4% Glycemic control for   <7.0% adults with diabetes     Recent Results (from the past 240 hour(s))  C Difficile Quick Screen w PCR reflex     Status: None   Collection Time: 08/08/20  4:30 PM   Specimen: STOOL  Result Value Ref Range Status   C Diff antigen NEGATIVE NEGATIVE Final   C Diff toxin NEGATIVE NEGATIVE Final   C Diff interpretation No C. difficile detected.  Final    Comment: Performed at [facility], [place] [address] [place], [place] [address]  Gastrointestinal Panel by PCR , Stool     Status: Abnormal   Collection Time: 08/08/20  4:30 PM   Specimen: STOOL  Result Value Ref Range Status   Campylobacter species NOT DETECTED NOT DETECTED Final   Plesimonas shigelloides NOT DETECTED NOT DETECTED Final   Salmonella species NOT DETECTED  NOT DETECTED Final   Yersinia enterocolitica NOT DETECTED NOT DETECTED Final   Vibrio species NOT DETECTED NOT DETECTED Final   Vibrio cholerae NOT DETECTED NOT DETECTED Final   Enteroaggregative E coli (EAEC) NOT DETECTED NOT DETECTED Final   Enteropathogenic E coli (EPEC) DETECTED (A) NOT DETECTED Final    Comment: CRITICAL RESULT CALLED TO, READ BACK BY AND VERIFIED WITH: MELISSA HOUSE RN @1408  08/09/20 SCS    Enterotoxigenic E coli (ETEC) NOT DETECTED NOT DETECTED Final   Shiga like toxin producing E coli (STEC) NOT DETECTED NOT DETECTED Final   Shigella/Enteroinvasive E coli (EIEC) NOT DETECTED NOT DETECTED Final   Cryptosporidium NOT DETECTED NOT DETECTED Final   Cyclospora cayetanensis NOT DETECTED NOT DETECTED Final   Entamoeba histolytica NOT DETECTED NOT DETECTED Final   Giardia lamblia NOT DETECTED NOT DETECTED Final   Adenovirus F40/41 NOT DETECTED NOT DETECTED Final   Astrovirus NOT DETECTED NOT DETECTED Final   Norovirus GI/GII NOT DETECTED NOT DETECTED Final  Rotavirus A NOT DETECTED NOT DETECTED Final   Sapovirus (I, II, IV, and V) NOT DETECTED NOT DETECTED Final    Comment: Performed at [facility], [place], [place] [address]  SARS CORONAVIRUS 2 (TAT 6-24 HRS) Nasopharyngeal Nasopharyngeal Swab     Status: None   Collection Time: 08/08/20  8:38 PM   Specimen: Nasopharyngeal Swab  Result Value Ref Range Status   SARS Coronavirus 2 NEGATIVE NEGATIVE Final    Comment: (NOTE) SARS-CoV-2 target nucleic acids are NOT DETECTED.  The SARS-CoV-2 RNA is generally detectable in upper and lower respiratory specimens during the acute phase of infection. Negative results do not preclude SARS-CoV-2 infection, do not rule out co-infections with other pathogens, and should not be used as the sole basis for treatment or other patient management decisions. Negative results must be combined with clinical observations, patient history, and epidemiological  information. The expected result is Negative.  Fact Sheet for Patients: SugarRoll.be  Fact Sheet for Healthcare Providers: https://www.woods-mathews.com/  This test is not yet approved or cleared by the [place] FDA and  has been authorized for detection and/or diagnosis of SARS-CoV-2 by FDA under an Emergency Use Authorization (EUA). This EUA will remain  in effect (meaning this test can be used) for the duration of the COVID-19 declaration under Se ction 564(b)(1) of the Act, 21 U.S.C. section 360bbb-3(b)(1), unless the authorization is terminated or revoked sooner.  Performed at [facility], [place] [address] [place], [place] [address]      Scheduled Meds:  amLODipine  2.5 mg Oral QHS   Chlorhexidine Gluconate Cloth  6 each Topical Daily   cholestyramine light  4 g Oral Q24H   diphenoxylate-atropine  1 tablet Oral TID   isosorbide mononitrate  15 mg Oral QHS   lactobacillus  1 g Oral TID WC   nystatin   Topical TID   pantoprazole (PROTONIX) IV  40 mg Intravenous Q24H   Warfarin - Pharmacist Dosing Inpatient   Does not apply q1600   Zinc Oxide   Topical QID   Continuous Infusions:  sodium chloride 250 mL (08/12/20 1618)   lactated ringers 150 mL/hr at 08/18/20 0839     LOS: 9 days   Cherene Altes, MD [facility] Office  [phone] Pager - Text Page per Shea Evans  If 7PM-7AM, please contact night-coverage per Amion 08/18/2020, 11:47 AM

## 2020-08-18 NOTE — Consult Note (Signed)
Consultation Note Date: 08/18/2020   Patient Name: [name]  DOB: [birth year]  MRN: [id number]  Age / Sex: 87 y.o., male  PCP: Shirline Frees, MD Referring Physician: Cherene Altes, MD  Reason for Consultation: Establishing goals of care  HPI/Patient Profile: 87 y.o. male  with past medical history of CAD, biventricular cardiac pacemaker, TAVR, A. fib on Coumadin, systolic and diastolic heart failure, hypertension, hyperlipidemia, OSA, DVT/PE status post IVC, prostate cancer, right renal subcapsular abscess secondary to ESBL that is being treated with 4 weeks of IV antibiotics, concern for C. difficile admitted on 08/08/2020 with worsening diarrhea and vomiting.  He was found to have enteropathogenic E. coli and has been treated with azithromycin.  He is also been receiving antimotility agents and cholestyramine.  He is required rectal tube as well as Foley due to severe skin breakdown.  Palliative consulted per family request.  Clinical Assessment and Goals of Care: I met today with [name] and his wife. I know him from prior admission in April of this year.  We discussed clinical course as well as wishes moving forward in regard to advanced directives.  Care plan this hospitalization discussed.  We discussed difference between a aggressive medical intervention path and a palliative, comfort focused care path.  Values and goals of care important to patient and family were attempted to be elicited.   Questions and concerns addressed.   PMT will continue to support holistically.   SUMMARY OF RECOMMENDATIONS   - [name] and his wife are hopeful that he may continue to improve, but they also have been very clear that goal is not to prolong life if he is not going to have acceptable quality.  Discussed continued challenges he has faced over the past few months and how miserable he has been with  continued diarrhea and skin breakdown.  At the same time, he has had some improvement in diarrhea over the past day or 2.  His wife and I discussed the emotional challenges involved because he has not had significant improvement.  However, he does not have something that cannot potentially get better which is made it difficult to determine next steps moving forward. - Plan for follow-up meeting tomorrow at [place].  His wife would like for me to try to reach out and touch base with the rest of care team to give them as best picture possible about his prognosis and likely outcomes.  Palliative Prophylaxis:  Frequent Pain Assessment  Psycho-social/Spiritual:  Desire for further Chaplaincy support:Did not address today Additional Recommendations: Education on Hospice  Prognosis:  Guarded  Discharge Planning: To Be Determined      Primary Diagnoses: Present on Admission:  Diarrhea  Cardiac pacemaker in situ  CKD (chronic kidney disease), stage III (HCC)  CAD (coronary artery disease), native coronary artery  Persistent atrial fibrillation ([place])  Renal abscess   I have reviewed the medical record, interviewed the patient and family, and examined the patient. The following aspects are pertinent.  Past Medical History:  Diagnosis Date   Aortic valve stenosis 04/05/2015   Formatting of this note might be different from the original. Overview:  ECHO 5/15 Formatting of this note might be different from the original. Overview:  Overview:  ECHO 5/15   Arthritis    "knees, right shoulder" (03/14/2017)   Atrial fibrillation, persistent ([place]) 09/19/2014   CAD (coronary artery disease), native coronary artery    Cath 2011 [facility] (08/2009):~ Proximal LAD 30%, mid to distal LAD 25%, ostial small D1 75% mid AV groove circumflex 99% been subtotal stenosis, proximal to mid RCA 25-30%, mid RCA 30%, mid PDA 30%, EF 50% with inferior hypokinesis.;    July 2011  PCI and DES to circumflex Dr. Olevia Perches   ETT-Myoview  (06/2013):  Inferolateral scar, mild peri-infarct ischemia, EF 40%; Intermediate Risk   ECHO EF 55% 03/2013    CAD (coronary artery disease), native coronary artery 04/05/2015   Cath 2011 [facility] (08/2009):~ Proximal LAD 30%, mid to distal LAD 25%, ostial small D1 75% mid AV groove circumflex 99% been subtotal stenosis, proximal to mid RCA 25-30%, mid RCA 30%, mid PDA 30%, EF 50% with inferior hypokinesis.;    July 2011  PCI and DES to circumflex Dr. Olevia Perches   ETT-Myoview (06/2013):  Inferolateral scar, mild peri-infarct ischemia, EF 40%; Intermediate Risk   ECHO EF 55% 03/2013     Cardiac pacemaker in situ 03/14/2017   Biventricular St. Jude inserted 03/14/17 Dr. Lovena Le for second degree heart block    Chronic combined systolic and diastolic heart failure ([place]) 11/19/2017   CKD (chronic kidney disease), stage III ([facility])    Gastro-esophageal reflux disease without esophagitis 09/24/2009   GERD    History of infection of prosthetic knee 04/05/2015   Treated with debridement and irrigation followed by 6 months of triple antibiotics   History of kidney stones    History of peptic ulcer 1970s?   History of prostate cancer    Radical prostatectomy in 2006 Dr. Terance Hart    Hypertensive heart disease    Internal hemorrhoids 09/11/2018   Long term (current) use of anticoagulants 10/06/2011   Mixed hyperlipidemia    Moderate persistent asthma without complication 3/79/0240   Noise effect on both inner ears 08/14/2018   Obesity (BMI 30-39.9)    OSA on CPAP    OSA treated with BiPAP 11/15/2015   Persistent atrial fibrillation ([place]) 09/19/2014   CHA2DS2VASC score 5  Cardioversion 10/08/2014 was on amiodarone until 2018    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)    Initial DVT in 2006 after prostate surgery and had [place] filter placed Bilateral  PE 2013 and placed back on warfarin DVT of right subclavian vein on doppler 10/2012 at time of knee infection    Personal history of malignant  neoplasm of prostate    Radical prostatectomy in 2006 Dr. Terance Hart     Presbycusis of both ears 08/14/2018   Severe aortic stenosis    Severe aortic stenosis 03/18/2018   Urinary incontinence    MULTIPLE BLADDER SURGERIES - STATES NO URINARY SPHINCTER - PT'S UROLOGIST IS AT [facility]- DR. PETERSON  ( LAST VISIT WAS 09/15/11 )   Social History   Socioeconomic History   Marital status: Married    Spouse name: Not on file   Number of children: 3   Years of education: Not on file   Highest education level: Not on file  Occupational History   Occupation: Retired Therapist, nutritional  Tobacco Use   Smoking  status: Former    Packs/day: 1.00    Years: 10.00    Pack years: 10.00    Types: Cigarettes    Quit date: 04/27/1961    Years since quitting: 59.3   Smokeless tobacco: Never  Vaping Use   Vaping Use: Never used  Substance and Sexual Activity   Alcohol use: No    Alcohol/week: 0.0 standard drinks   Drug use: No   Sexual activity: Not Currently  Other Topics Concern   Not on file  Social History Narrative   Not on file   Social Determinants of Health   Financial Resource Strain: Not on file  Food Insecurity: Not on file  Transportation Needs: Not on file  Physical Activity: Not on file  Stress: Not on file  Social Connections: Not on file   Family History  Problem Relation Age of Onset   Melanoma Father    Melanoma Brother    Scheduled Meds:  amLODipine  2.5 mg Oral QHS   Chlorhexidine Gluconate Cloth  6 each Topical Daily   cholestyramine light  4 g Oral Q24H   diphenoxylate-atropine  1 tablet Oral TID   isosorbide mononitrate  15 mg Oral QHS   lactobacillus  1 g Oral TID WC   nystatin   Topical TID   pantoprazole (PROTONIX) IV  40 mg Intravenous Q24H   Warfarin - Pharmacist Dosing Inpatient   Does not apply q1600   Zinc Oxide   Topical QID   Continuous Infusions:  sodium chloride 250 mL (08/12/20 1618)   lactated ringers 150 mL/hr at 08/18/20 0134   PRN Meds:.sodium  chloride, acetaminophen, HYDROmorphone (DILAUDID) injection, lidocaine, ondansetron (ZOFRAN) IV, oxyCODONE, prochlorperazine, sodium chloride flush Medications Prior to Admission:  Prior to Admission medications   Medication Sig Start Date End Date Taking? Authorizing Provider  acetaminophen (TYLENOL) 500 MG tablet Take 500 mg by mouth at bedtime.    Yes [provider]  amLODipine (NORVASC) 2.5 MG tablet Take 1 tablet (2.5 mg total) by mouth daily. 09/03/19  Yes Tobb, Kardie, DO  Ascorbic Acid (VITAMIN C) 1000 MG tablet Take 1,000 mg by mouth daily.   Yes [provider]  Biotin 2500 MCG CAPS Take 2,500 mcg by mouth daily.   Yes [provider]  cholecalciferol (VITAMIN D) 1000 UNITS tablet Take 1,000 Units by mouth daily.   Yes [provider]  clotrimazole (LOTRIMIN) 1 % cream Apply to affected area 2 times daily 08/08/20  Yes Sherwood Gambler, MD  docusate sodium (COLACE) 50 MG capsule Take 50 mg by mouth daily.   Yes [provider]  furosemide (LASIX) 20 MG tablet Take 1 tablet (20 mg total) by mouth daily. 06/02/20  Yes Richardo Priest, MD  Glucosamine HCl (GLUCOSAMINE PO) Take 2,000 mg by mouth daily.   Yes [provider]  isosorbide mononitrate (IMDUR) 30 MG 24 hr tablet Take 0.5 tablets (15 mg total) by mouth daily. 02/05/20  Yes Richardo Priest, MD  Multiple Vitamin (MULTIVITAMIN) capsule Take 1 capsule by mouth daily.  04/28/19  Yes [provider]  nitroGLYCERIN (NITROSTAT) 0.4 MG SL tablet Place 1 tablet (0.4 mg total) under the tongue every 5 (five) minutes as needed. 07/14/19  Yes Richardo Priest, MD  Omega-3 Fatty Acids (FISH OIL) 1000 MG CAPS Take 1,000 mg by mouth daily.   Yes [provider]  pantoprazole (PROTONIX) 40 MG tablet Take 1 tablet (40 mg total) by mouth daily at 6 (six) AM. 05/29/20  Yes Sheikh, Omair Latif, DO  pravastatin (PRAVACHOL) 20 MG tablet TAKE ONE TABLET BY MOUTH ONCE DAILY Patient taking  differently: Take 20 mg by mouth daily. 01/08/20  Yes Richardo Priest, MD  vitamin B-12 (CYANOCOBALAMIN) 500 MCG tablet Take 500 mcg by mouth daily.   Yes [provider]  warfarin (COUMADIN) 2.5 MG tablet Take 1 tablet daily or as directed by Coumadin Clinic Patient taking differently: Take 2.5 mg by mouth daily. 1.85m on Wednesday and Saturday, 2.587mall other days 04/16/20  Yes MuRichardo PriestMD  diphenoxylate-atropine (LOMOTIL) 2.5-0.025 MG tablet Take 1 tablet by mouth 4 (four) times daily as needed for diarrhea or loose stools. 08/08/20   GoSherwood GamblerMD  Omadacycline Tosylate 150 MG TABS Take 300 mg by mouth daily. 08/06/20   Kuppelweiser, Cassie L, RPH-CPP  ondansetron (ZOFRAN ODT) 4 MG disintegrating tablet Take 1 tablet (4 mg total) by mouth every 8 (eight) hours as needed for nausea or vomiting. 08/08/20   GoSherwood GamblerMD  polyethylene glycol (MIRALAX / GLYCOLAX) 17 g packet Take 17 g by mouth 2 (two) times daily. Patient not taking: Reported on 08/08/2020 05/28/20   ShRaiford Nobleatif, DO   Allergies  Allergen Reactions   Darifenacin Nausea Only and Other (See Comments)    dry mouth and dizziness ENABLEX Dizziness Pt denies   Codeine Other (See Comments)   Sulfamethoxazole-Trimethoprim Other (See Comments)   Lisinopril Cough    Pt denies   Review of Systems  Constitutional:  Positive for activity change and fatigue.  Gastrointestinal:  Positive for diarrhea and nausea.  Neurological:  Positive for weakness.  Psychiatric/Behavioral:  Positive for sleep disturbance.    Physical Exam General: Alert, awake, in no acute distress.  Confused.   HEENT: No bruits, no goiter, no JVD Heart: Regular rate and rhythm. No murmur appreciated. Lungs: Good air movement, clear Abdomen: Soft, nontender, mildly distended, positive bowel sounds.   Ext: No significant edema Skin: Warm and dry, erythema bilaterally Neuro: Grossly intact, nonfocal.   Vital Signs: BP (!) 120/58 (BP  Location: Left Arm)   Pulse 60   Temp 98.3 F (36.8 C) (Oral)   Resp 20   Ht 5' 8" (1.727 m)   Wt 97.1 kg   SpO2 98%   BMI 32.56 kg/m  Pain Scale: 0-10 POSS *See Group Information*: S-Acceptable,Sleep, easy to arouse Pain Score: 0-No pain   SpO2: SpO2: 98 % O2 Device:SpO2: 98 % O2 Flow Rate: .   IO: Intake/output summary:  Intake/Output Summary (Last 24 hours) at 08/18/2020 0804 Last data filed at 08/18/2020 0448 Gross per 24 hour  Intake --  Output 850 ml  Net -850 ml    LBM: Last BM Date: 08/17/20 Baseline Weight: Weight: 93.4 kg Most recent weight: Weight: 97.1 kg     Palliative Assessment/Data:   Flowsheet Rows    Flowsheet Row Most Recent Value  Intake Tab   Referral Department Hospitalist  Unit at Time of Referral Med/Surg Unit  Palliative Care Primary Diagnosis Sepsis/Infectious Disease  Date Notified 08/15/20  Palliative Care Type Return patient Palliative Care  Reason for referral Clarify Goals of Care  Date of Admission 08/08/20  Date first seen by Palliative Care 08/17/20  # of days Palliative referral response time 2 Day(s)  # of days IP prior to Palliative referral 7  Clinical Assessment   Palliative Performance Scale Score 30%  Psychosocial & Spiritual Assessment   Palliative Care Outcomes   Patient/Family meeting held? Yes  Who was at the meeting? Patient, wife       Time In: 1715 Time Out: 1830 Time Total: 75 Greater than 50%  of this time was spent counseling and coordinating care related to the above assessment and plan.  Signed by: Micheline Rough, MD   Please contact Palliative Medicine Team phone at [phone] for questions and concerns.  For individual provider: See Shea Evans

## 2020-08-18 NOTE — Telephone Encounter (Signed)
Calling in because faxes that were sent over that needs to be signed. Please admise

## 2020-08-18 NOTE — Progress Notes (Signed)
PT Cancellation Note  Patient Details Name: [name] MRN: [id number] DOB: 1931-08-26   Cancelled Treatment:    Reason Eval/Treat Not Completed: Patient declined, Patient concerned about Flexiseal and trying to sit up.Will check back tomorrow inhopes we can assist him to sitting up.  Claretha Cooper 08/18/2020, 4:53 PM  [place] Pager [phone] Office [phone]

## 2020-08-18 NOTE — Progress Notes (Signed)
CRITICAL VALUE STICKER  CRITICAL VALUE: INR 5.1   RECEIVER (on-site recipient of call): [name], LPN  [place] NOTIFIED: [phone] on 08/18/20  MESSENGER (representative from lab): Ubaldo Glassing  MD NOTIFIED: Gershon Cull NP  TIME OF NOTIFICATION: [phone]  RESPONSE: On call provider notified. No other issues at this time. Will continue to monitor patient.

## 2020-08-18 NOTE — Plan of Care (Signed)
°  Problem: Coping: °Goal: Level of anxiety will decrease °Outcome: Progressing °  °

## 2020-08-18 NOTE — Progress Notes (Signed)
[name] for Infectious Disease    Date of Admission:  08/08/2020      ID: [name] is a 87 y.o. male with   Principal Problem:   Diarrhea Active Problems:   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)   Long term (current) use of anticoagulants   CAD (coronary artery disease), native coronary artery   Persistent atrial fibrillation (HCC)   Cardiac pacemaker in situ   CKD (chronic kidney disease), stage III (HCC)   S/P TAVR (transcatheter aortic valve replacement)   OSA on CPAP   Presence of IVC filter   Renal abscess   Infectious diarrhea   ESBL (extended spectrum beta-lactamase) producing bacteria infection    Subjective: Afebrile. Better appetite. No abdominal pain. Still having watery diarrhea requiring rectal tube  Medications:   amLODipine  2.5 mg Oral QHS   Chlorhexidine Gluconate Cloth  6 each Topical Daily   cholestyramine light  4 g Oral Q24H   diphenoxylate-atropine  1 tablet Oral QID   isosorbide mononitrate  15 mg Oral QHS   nystatin   Topical TID   pantoprazole (PROTONIX) IV  40 mg Intravenous Q24H   Warfarin - Pharmacist Dosing Inpatient   Does not apply q1600   Zinc Oxide   Topical QID    Objective: Vital signs in last 24 hours: Temp:  [97.8 F (36.6 C)-98.3 F (36.8 C)] 98.3 F (36.8 C) (07/13 0449) Pulse Rate:  [42-60] 60 (07/13 0449) Resp:  [20] 20 (07/13 0449) BP: (120-141)/(58-63) 120/58 (07/13 0449) SpO2:  [96 %-98 %] 98 % (07/13 0449) Weight:  [97.1 kg] 97.1 kg (07/13 0449) Physical Exam  Constitutional: He is oriented to person, place, and time. He appears well-developed and well-nourished. No distress.  HENT:  Mouth/Throat: Oropharynx is clear and moist. No oropharyngeal exudate.  Cardiovascular: Normal rate, regular rhythm and normal heart sounds. Exam reveals no gallop and no friction rub.  No murmur heard.  Pulmonary/Chest: Effort normal and breath sounds normal. No respiratory distress.  He has no wheezes.  Abdominal: Soft. Bowel sounds are normal. He exhibits no distension. There is no tenderness.  Lymphadenopathy:  He has no cervical adenopathy.  Neurological: He is alert and oriented to person, place, and time.  Skin: Skin is warm and dry. No rash noted. No erythema.  Psychiatric: He has a normal mood and affect. His behavior is normal.   Lab Results Recent Labs    08/17/20 0453 08/18/20 0340  WBC 13.4* 11.4*  HGB 13.1 12.5*  HCT 41.6 38.2*  NA 141 142  K 3.3* 3.4*  CL 118* 117*  CO2 17* 21*  BUN 50* 42*  CREATININE 1.43* 1.17     Microbiology: reviewed Studies/Results: DG CHEST PORT 1 VIEW  Result Date: 08/17/2020 CLINICAL DATA:  PICC line placement EXAM: PORTABLE CHEST 1 VIEW COMPARISON:  07/26/2020 FINDINGS: Right PICC line is in place with the tip in the SVC. Left pacer remains in place, unchanged. Heart is normal size. Lungs clear. No effusions or acute bony abnormality. IMPRESSION: Right PICC line tip in the SVC. No active cardiopulmonary disease. Electronically Signed   By: Rolm Baptise M.D.   On: 08/17/2020 15:53   [place] EKG SITE RITE  Result Date: 08/17/2020 If Site Rite image not attached, placement could not be confirmed due to current cardiac rhythm.    Assessment/Plan: Watery diarrhea thought to be 2/2 EPEC but taking some time to recover, despite 6 d of azithromycin.  Will increase lomotil to QID to see if any difference appreciated.   Hx of esbl ecoli renal abscess= appears intolerant of abtx side effects. He has completed 12 wks of IV therapy and will watch to see if at risk for recurrence. At this time with discussion of goals of care, we will not re-initiate abtx-- plan also discussed with dr Domingo Cocking.  Moderate protein-calorie malnutrition = continue with eating foods that he enjoys, possibly also supplement with protein drinks.   [facility] for Infectious Diseases Cell: [phone] Pager:  [phone]  08/18/2020, 6:29 PM

## 2020-08-19 DIAGNOSIS — Z515 Encounter for palliative care: Secondary | ICD-10-CM | POA: Diagnosis not present

## 2020-08-19 DIAGNOSIS — A499 Bacterial infection, unspecified: Secondary | ICD-10-CM | POA: Diagnosis not present

## 2020-08-19 DIAGNOSIS — A09 Infectious gastroenteritis and colitis, unspecified: Secondary | ICD-10-CM | POA: Diagnosis not present

## 2020-08-19 DIAGNOSIS — Z7189 Other specified counseling: Secondary | ICD-10-CM | POA: Diagnosis not present

## 2020-08-19 LAB — BASIC METABOLIC PANEL
Anion gap: 4 — ABNORMAL LOW (ref 5–15)
BUN: 35 mg/dL — ABNORMAL HIGH (ref 8–23)
CO2: 21 mmol/L — ABNORMAL LOW (ref 22–32)
Calcium: 9 mg/dL (ref 8.9–10.3)
Chloride: 117 mmol/L — ABNORMAL HIGH (ref 98–111)
Creatinine, Ser: 1 mg/dL (ref 0.61–1.24)
GFR, Estimated: >60 mL/min (ref >60)
Glucose, Bld: 93 mg/dL (ref 70–99)
Potassium: 3.4 mmol/L — ABNORMAL LOW (ref 3.5–5.1)
Sodium: 142 mmol/L (ref 135–145)

## 2020-08-19 LAB — CBC
HCT: 37.9 % — ABNORMAL LOW (ref 39.0–52.0)
Hemoglobin: 12.3 g/dL — ABNORMAL LOW (ref 13.0–17.0)
MCH: 26.1 pg (ref 26.0–34.0)
MCHC: 32.5 g/dL (ref 30.0–36.0)
MCV: 80.3 fL (ref 80.0–100.0)
Platelets: 102 10*3/uL — ABNORMAL LOW (ref 150–400)
RBC: 4.72 MIL/uL (ref 4.22–5.81)
RDW: 16.6 % — ABNORMAL HIGH (ref 11.5–15.5)
WBC: 10.4 10*3/uL (ref 4.0–10.5)
nRBC: 0 % (ref 0.0–0.2)

## 2020-08-19 LAB — PROTIME-INR
INR: 4.4 (ref 0.8–1.2)
Prothrombin Time: 42 seconds — ABNORMAL HIGH (ref 11.4–15.2)

## 2020-08-19 LAB — MAGNESIUM: Magnesium: 1.8 mg/dL (ref 1.7–2.4)

## 2020-08-19 IMAGING — CR DG CHEST 2V
2 series · 2 of 2 positions shown · non-contrast
Comparison: 03/15/2017

CLINICAL DATA: Preoperative evaluation for heart surgery next week,
former smoker

EXAM:
CHEST - 2 VIEW

[w chest pa]
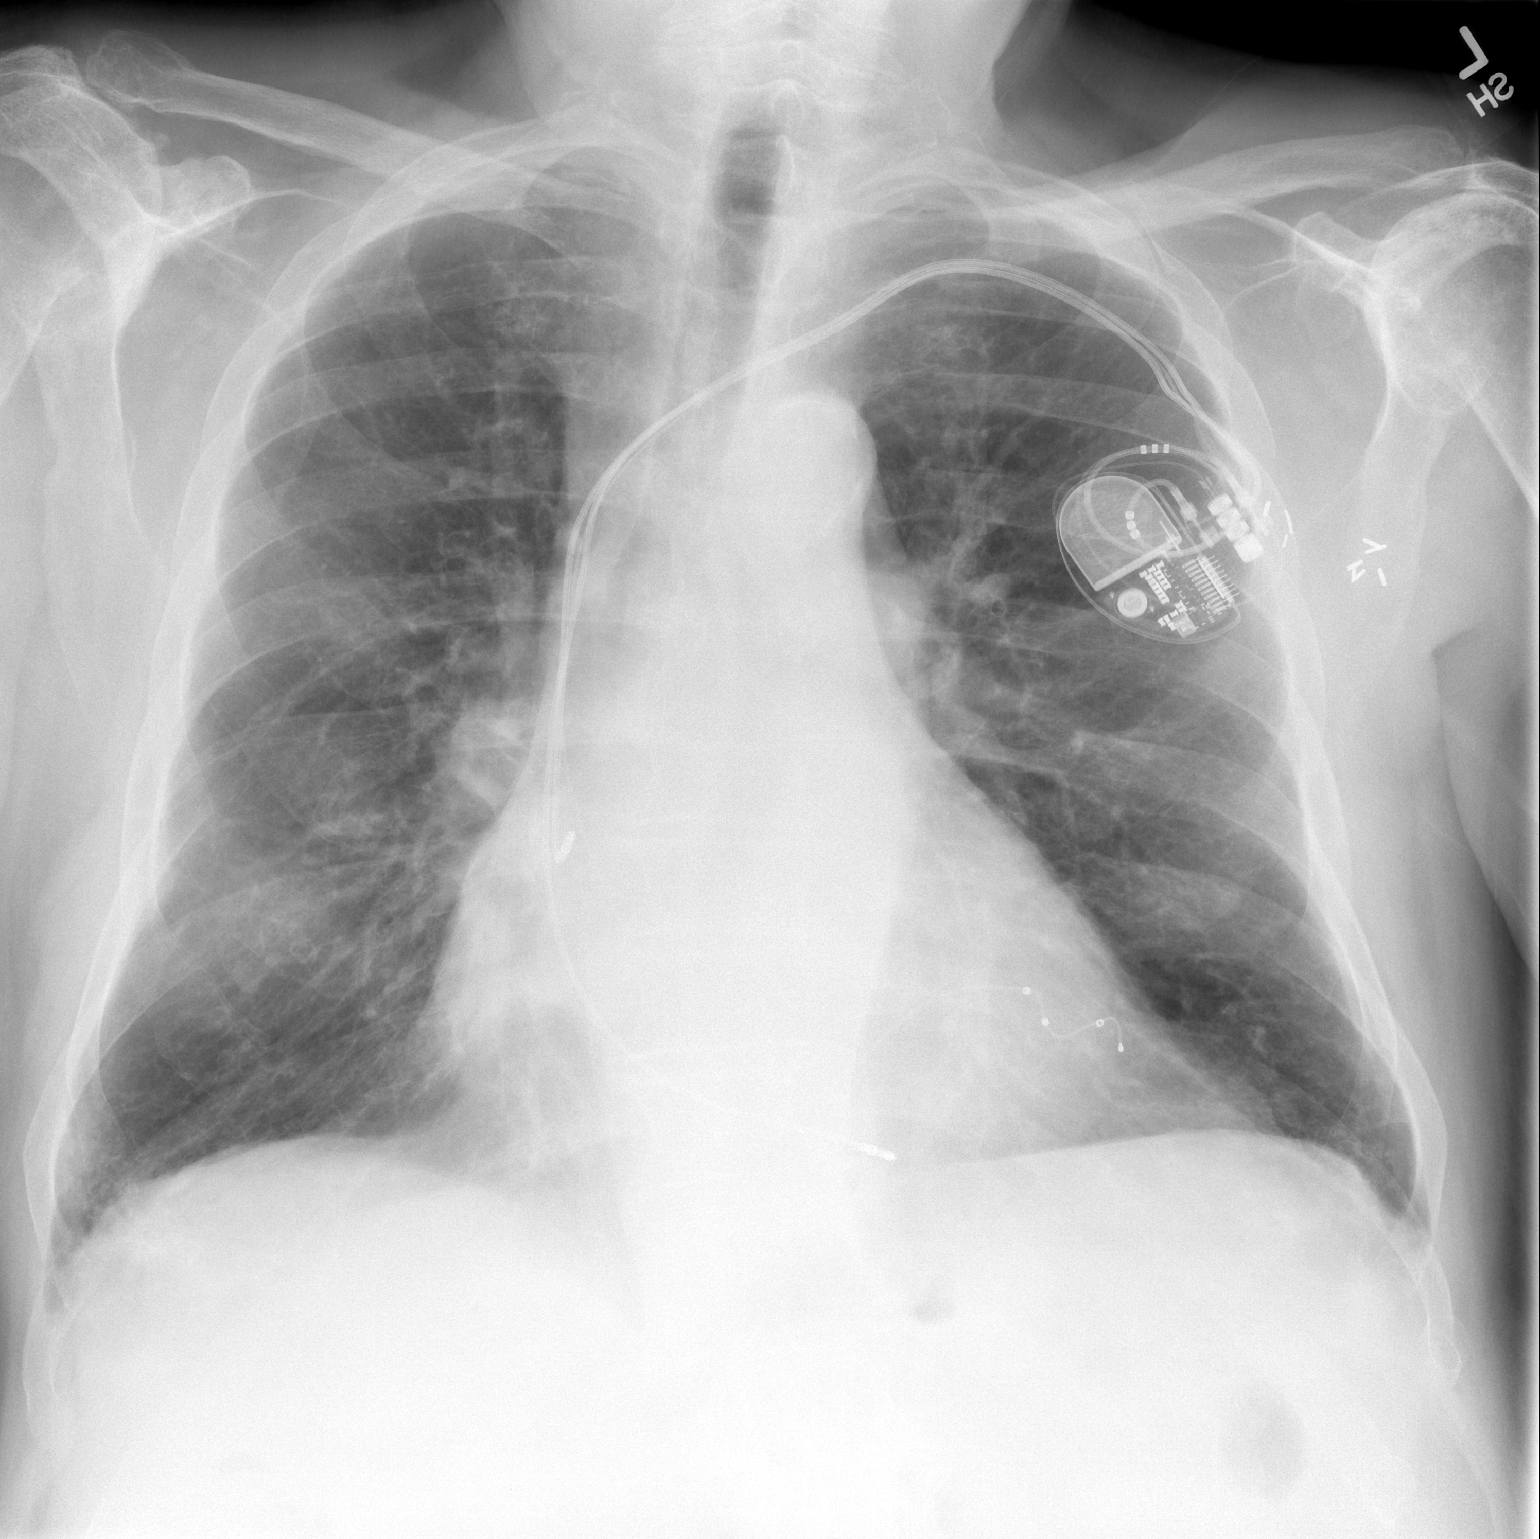

[w chest lat]
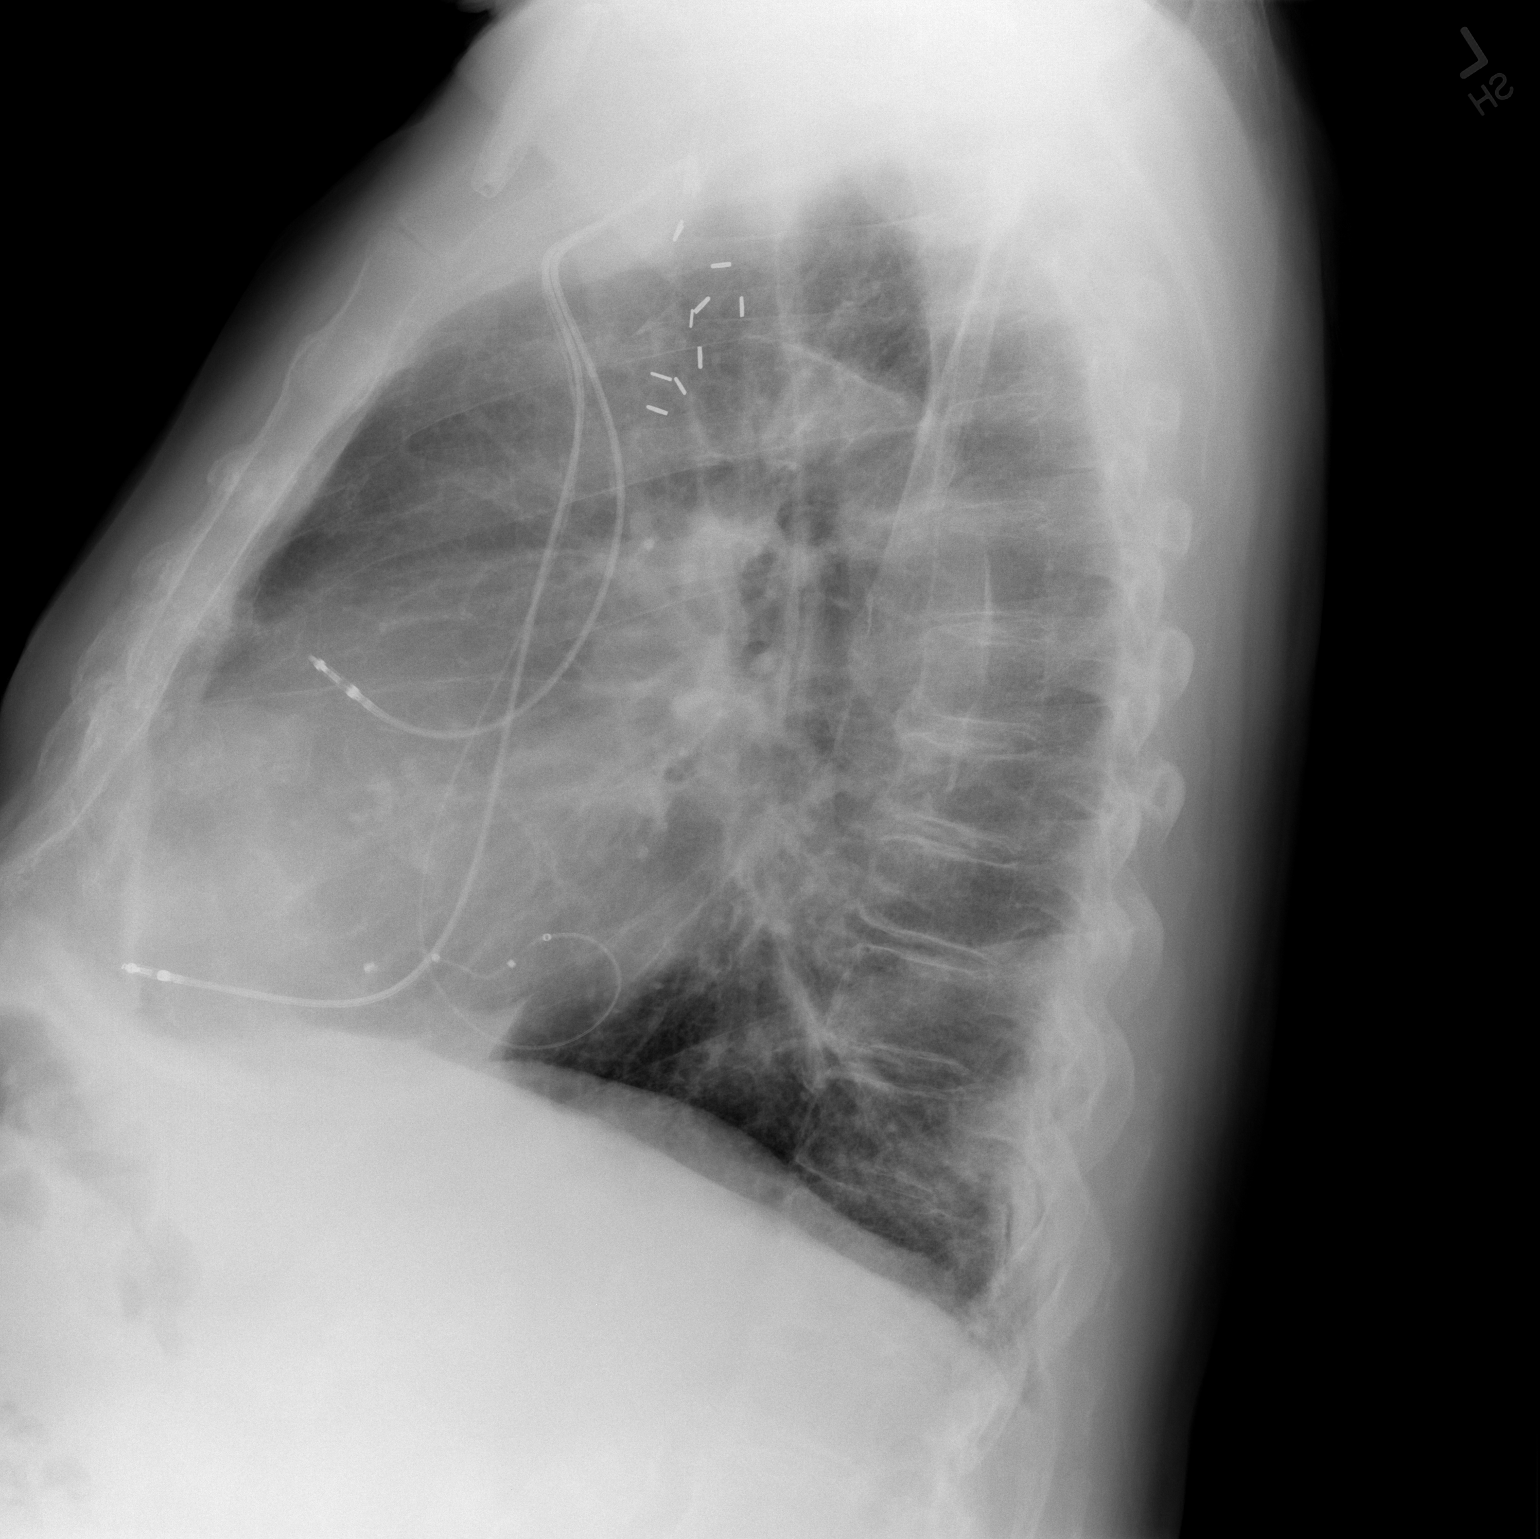

[2 of 2 positions shown; findings below may reference images not displayed]

FINDINGS: LEFT subclavian pacemaker leads project at RIGHT atrium, RIGHT
ventricle, and coronary sinus.

Enlargement of cardiac silhouette.

Mediastinal contours and pulmonary vascularity normal.

Atherosclerotic calcification aorta.

Lungs appear emphysematous but clear.

No infiltrate, pleural effusion, or pneumothorax.

Probable BILATERAL nipple shadows, also present on earlier study of
10/01/2013.

BILATERAL degenerative changes in the shoulders with evidence of
chronic rotator cuff tears.
IMPRESSION: Enlargement of cardiac silhouette post pacemaker.

Emphysematous changes without acute abnormalities.

## 2020-08-19 MED ORDER — ADULT MULTIVITAMIN W/MINERALS CH
1.0000 | ORAL_TABLET | Freq: Every day | ORAL | Status: DC
  Administered 2020-08-19 – 2020-08-25 (×7): 1 via ORAL
  Filled 2020-08-19 (×7): qty 1

## 2020-08-19 MED ORDER — BOOST / RESOURCE BREEZE PO LIQD CUSTOM
1.0000 | Freq: Three times a day (TID) | ORAL | Status: DC
  Administered 2020-08-19 – 2020-08-24 (×14): 1 via ORAL

## 2020-08-19 NOTE — Progress Notes (Addendum)
Initial Nutrition Assessment  INTERVENTION:   -Boost Breeze po TID, each supplement provides 250 kcal and 9 grams of protein  -Multivitamin with minerals daily  NUTRITION DIAGNOSIS:   Increased nutrient needs related to diarrhea as evidenced by estimated needs.  GOAL:   Patient will meet greater than or equal to 90% of their needs  MONITOR:   PO intake, Supplement acceptance, Labs, Weight trends, I & O's, Skin  REASON FOR ASSESSMENT:   Malnutrition Screening Tool    ASSESSMENT:   87 y.o. male with medical history significant for CAD, biventricular cardiac pacemaker, s/p TAVR, persistent atrial fibrillation on Coumadin, combined systolic and diastolic HF, HLD, HTN, OSA on CPAP, hx of DVT/PE s/p IVC, hx of prostate cancer s/p radical prostatectomy, recent right renal subcapsular abscess secondary to ESBL in the setting of nonobstructing nephrolithiasis (followed by urology and ID as an outpatient) treated with 12 weeks of IV ertapenem (completed 08/08/2020), recently started on oral vancomycin from 08/06/2020 by ID for C. difficile diarrhea presented with worsening diarrhea along with vomiting.  7/3: admitted 7/8: rectal tube placed  Patient unavailable for history, pt's wife requesting pt sleep. Per chart review, pt has had chronic diarrhea since receiving 12 weeks of IV antibiotics to treat a renal abscess. Pt has had episodes of vomiting as well but none currently.  Currently pt consuming up to 50% of meals. Yesterday only ordered items such as broth and oatmeal. Today only ordered lunch meal so far. Will order Boost Breeze supplements for additional kcals and protein.  Palliative care following for GOC.  Admission weight: 206 lbs. Current weight: 214 lbs. Per weight records, pt had lost 27 lbs since 2/2 (11% wt loss x 5.5 months, significant for time frame).  I/Os:-1.1L since admit UOP: 750 ml x 24 hrs  Medications: Lactated ringers  Labs reviewed:  Low K  NUTRITION -  FOCUSED PHYSICAL EXAM:  Deferred  Diet Order:   Diet Order             Diet regular Room service appropriate? Yes; Fluid consistency: Thin  Diet effective now                   EDUCATION NEEDS:   No education needs have been identified at this time  Skin:  Skin Assessment: Skin Integrity Issues: Skin Integrity Issues:: Other (Comment) Other: MASD from diarrhea  Last BM:  7/14 -type 7 -rectal tube  Height:   Ht Readings from Last 1 Encounters:  08/08/20 5\' 8"  (1.727 m)    Weight:   Wt Readings from Last 1 Encounters:  08/18/20 97.1 kg    BMI:  Body mass index is 32.56 kg/m.  Estimated Nutritional Needs:   Kcal:  1800-2000  Protein:  85-100g  Fluid:  2L/day  Clayton Bibles, MS, RD, LDN Inpatient Clinical Dietitian Contact information available via Amion

## 2020-08-19 NOTE — Progress Notes (Signed)
[name]  WNI:[id number] DOB: 1931-03-08 DOA: 08/08/2020 PCP: Shirline Frees, MD    Brief Narrative:  [phone] with a history of CAD, biventricular cardiac pacemaker, status post TAVR, persistent atrial fibrillation on Coumadin, combined systolic and diastolic CHF, HLD, HTN, OSA, DVT/PE status post IVC filter, prostate cancer status post radical prostatectomy, and a recent right renal subcapsular abscess secondary to ESBL (treatment complicated by C. difficile colitis) who presented to the ED with worsening diarrhea and vomiting.  Significant Events:  After admission enteropathogenic E. coli was noted in his stool samples and he was treated with azithromycin.  Antimotility agents and cholestyramine were utilized.  Corrective tube was required.  Foley catheter had to be inserted due to incontinence leading to severe perineal skin irritation.  Consultants:  Palliative care Infectious disease  Code Status: NO CODE BLUE   Antimicrobials:  Azithromycin Eravacycline  DVT prophylaxis: Warfarin  Subjective: In good spirits today.  Quite pleasant to speak with.  No new complaints.  Continues to have buttock and perineal pain.  Remains motivated to ambulate.  Reports significant decrease in frequency of diarrhea over the last 24 hours.  Assessment & Plan:  Enteropathogenic E. coli with severe and persistent diarrhea C. difficile negative - completed course of azithromycin -continue Lomotil and cholestyramine - barrier cream to be applied liberally and often -rectal tube reinserted 7/8 -appears to be making progress and hope to be able to DC rectal tube tomorrow  Moisture associated skin damage with candidiasis Nystatin powder being utilized -Foley catheter has resulted in significant improvement -must continue Foley for now to allow ongoing wound healing  Perinephric abscess ESBL E. Coli Completed 12 weeks of IV ertapenem 08/08/2020 - now off antibiotics per ID  Acute kidney injury on  CKD stage IIIa Creatinine has actually normalized at this time   Recent Labs  Lab 08/15/20 0518 08/16/20 0434 08/17/20 0453 08/18/20 0340 08/19/20 0330  CREATININE 2.05* 1.92* 1.43* 1.17 1.00     Frequent vomiting Resolved  Remote history of PE/DVT Anticoagulated with Coumadin  Persistent atrial fibrillation Continue Coumadin therapy  Bioprosthetic TAVR Continue Coumadin  Hypomagnesemia Corrected with supplementation  Hypokalemia Cont to supplement as needed and follow trend  Acute hospital delirium Appears much improved at this time -continue to monitor closely  Thrombocytopenia Due to above - platelet count stable  HTN  HLD  OSA  Obesity - Body mass index is 32.56 kg/m.    Family Communication: Spoke with wife at bedside at length Status is: Inpatient  Remains inpatient appropriate because:Inpatient level of care appropriate due to severity of illness  Dispo: The patient is from: Home              Anticipated d/c is to:  unclear              Patient currently is not medically stable to d/c.   Difficult to place patient No  Objective: Blood pressure (!) 163/78, pulse 63, temperature 98.2 F (36.8 C), resp. rate 20, height 5\' 8"  (1.727 m), weight 97.1 kg, SpO2 98 %.  Intake/Output Summary (Last 24 hours) at 08/19/2020 1041 Last data filed at 08/19/2020 0900 Gross per 24 hour  Intake 1659.78 ml  Output 750 ml  Net 909.78 ml    Filed Weights   08/13/20 0440 08/17/20 0359 08/18/20 0449  Weight: 91.4 kg 92.5 kg 97.1 kg    Examination: General: No acute respiratory distress Lungs: CTA B without wheezing Cardiovascular: RRR without murmur Abdomen: NT/ND, soft, BS positive,  no rebound Extremities: Trace bilateral lower extremity edema  CBC: Recent Labs  Lab 08/14/20 0516 08/16/20 0434 08/17/20 0453 08/18/20 0340 08/19/20 0330  WBC 11.0*   < > 13.4* 11.4* 10.4  NEUTROABS 4.8  --   --   --   --   HGB 15.7   < > 13.1 12.5* 12.3*  HCT  49.9   < > 41.6 38.2* 37.9*  MCV 81.9   < > 82.5 79.9* 80.3  PLT 185   < > 127* 121* 102*   < > = values in this interval not displayed.    Basic Metabolic Panel: Recent Labs  Lab 08/16/20 0434 08/17/20 0453 08/18/20 0340 08/19/20 0330  NA 139 141 142 142  K 3.4* 3.3* 3.4* 3.4*  CL 113* 118* 117* 117*  CO2 18* 17* 21* 21*  GLUCOSE 101* 121* 101* 93  BUN 55* 50* 42* 35*  CREATININE 1.92* 1.43* 1.17 1.00  CALCIUM 9.4 9.1 9.0 9.0  MG 2.3 1.9  --  1.8    GFR: Estimated Creatinine Clearance: 58.8 mL/min (by C-G formula based on SCr of 1 mg/dL).  Liver Function Tests: No results for input(s): AST, ALT, ALKPHOS, BILITOT, PROT, ALBUMIN in the last 168 hours. No results for input(s): LIPASE, AMYLASE in the last 168 hours. No results for input(s): AMMONIA in the last 168 hours.  Coagulation Profile: Recent Labs  Lab 08/15/20 0518 08/16/20 0434 08/17/20 0453 08/18/20 0340 08/19/20 0330  INR 4.2* 4.5* 4.8* 5.1* 4.4*     HbA1C: Hgb A1c MFr Bld  Date/Time Value Ref Range Status  03/18/2018 07:46 PM 6.3 (H) 4.8 - 5.6 % Final    Comment:    (NOTE) Pre diabetes:          5.7%-6.4% Diabetes:              >6.4% Glycemic control for   <7.0% adults with diabetes     No results found for this or any previous visit (from the past 240 hour(s)).    Scheduled Meds:  Chlorhexidine Gluconate Cloth  6 each Topical Daily   cholestyramine light  4 g Oral Q24H   diphenoxylate-atropine  1 tablet Oral QID   isosorbide mononitrate  15 mg Oral QHS   nystatin   Topical TID   pantoprazole (PROTONIX) IV  40 mg Intravenous Q24H   Warfarin - Pharmacist Dosing Inpatient   Does not apply q1600   Zinc Oxide   Topical QID   Continuous Infusions:  sodium chloride 250 mL (08/12/20 1618)   lactated ringers 100 mL/hr at 08/19/20 0900     LOS: 10 days   Cherene Altes, MD [facility] Office  [phone] Pager - Text Page per Shea Evans  If 7PM-7AM, please contact night-coverage  per Amion 08/19/2020, 10:41 AM

## 2020-08-19 NOTE — Progress Notes (Signed)
[facility] for Infectious Disease    Date of Admission:  08/08/2020   Total days of antibiotics 6   ID: [name] is a 87 y.o. male with  EPEC diarrhea Principal Problem:   Diarrhea Active Problems:   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)   Long term (current) use of anticoagulants   CAD (coronary artery disease), native coronary artery   Persistent atrial fibrillation (HCC)   Cardiac pacemaker in situ   CKD (chronic kidney disease), stage III (HCC)   S/P TAVR (transcatheter aortic valve replacement)   OSA on CPAP   Presence of IVC filter   Renal abscess   Infectious diarrhea   ESBL (extended spectrum beta-lactamase) producing bacteria infection    Subjective: Less diarrhea for the first time! Did not sleep well on "water mattress" thus now back into standard bed. Still has some rawness to his buttock.  Medications:   Chlorhexidine Gluconate Cloth  6 each Topical Daily   cholestyramine light  4 g Oral Q24H   diphenoxylate-atropine  1 tablet Oral QID   feeding supplement  1 Container Oral TID BM   isosorbide mononitrate  15 mg Oral QHS   multivitamin with minerals  1 tablet Oral Daily   nystatin   Topical TID   pantoprazole (PROTONIX) IV  40 mg Intravenous Q24H   Warfarin - Pharmacist Dosing Inpatient   Does not apply q1600   Zinc Oxide   Topical QID    Objective: Vital signs in last 24 hours: Temp:  [98 F (36.7 C)-98.2 F (36.8 C)] 98.2 F (36.8 C) (07/14 0551) Pulse Rate:  [63-84] 63 (07/14 0551) Resp:  [18-20] 20 (07/14 0551) BP: (144-163)/(77-78) 163/78 (07/14 0551) SpO2:  [97 %-98 %] 98 % (07/14 0551) Physical Exam  Constitutional: He is oriented to person, place, and time. He appears well-developed and well-nourished. No distress.  HENT:  Mouth/Throat: Oropharynx is clear and moist. No oropharyngeal exudate.  Cardiovascular: Normal rate, regular rhythm and normal heart sounds. Exam reveals no gallop and no  friction rub.  No murmur heard.  Pulmonary/Chest: Effort normal and breath sounds normal. No respiratory distress. He has no wheezes.  Abdominal: Soft. Bowel sounds are normal. He exhibits no distension. There is no tenderness. Rectal bag collection system less watery stool  Lymphadenopathy:  He has no cervical adenopathy.  Neurological: He is alert and oriented to person, place, and time.  Skin: Skin is warm and dry. No rash noted. No erythema.  Psychiatric: He has a normal mood and affect. His behavior is normal.    Lab Results Recent Labs    08/18/20 0340 08/19/20 0330  WBC 11.4* 10.4  HGB 12.5* 12.3*  HCT 38.2* 37.9*  NA 142 142  K 3.4* 3.4*  CL 117* 117*  CO2 21* 21*  BUN 42* 35*  CREATININE 1.17 1.00   Liver Panel No results for input(s): PROT, ALBUMIN, AST, ALT, ALKPHOS, BILITOT, BILIDIR, IBILI in the last 72 hours. Sedimentation Rate No results for input(s): ESRSEDRATE in the last 72 hours. C-Reactive Protein No results for input(s): CRP in the last 72 hours.  Microbiology:  Studies/Results: No results found.   Assessment/Plan: Epec diarrhea = received full 5-6 d of azithromycin. Will continue with supportive care and keep with lomotil QID.  Hx of esbl ecoli renal abscess = continue to monitor off of treatment. He has received full course. Unclear if he can tolerate suppression with any other abtx due to  side effect of nausea and vomiting.  Continue to monitor with off of antibiotics for the time being to see how he improves oral intake and nutritional support.  [facility] for Infectious Diseases Cell: [phone] Pager: [phone]  08/19/2020, 6:03 PM

## 2020-08-19 NOTE — Progress Notes (Signed)
ANTICOAGULATION CONSULT NOTE - Follow Up Consult  Pharmacy Consult for warfarin Indication: hx atrial fibrillation, pulmonary embolus, and DVT  Allergies  Allergen Reactions   Darifenacin Nausea Only and Other (See Comments)    dry mouth and dizziness ENABLEX Dizziness Pt denies   Codeine Other (See Comments)   Sulfamethoxazole-Trimethoprim Other (See Comments)   Lisinopril Cough    Pt denies    Patient Measurements: Height: 5\' 8"  (172.7 cm) Weight: 97.1 kg (214 lb 2.7 oz) IBW/kg (Calculated) : 68.4 Heparin Dosing Weight:   Vital Signs: Temp: 98.2 F (36.8 C) (07/14 0551) BP: 163/78 (07/14 0551) Pulse Rate: 63 (07/14 0551)  Labs: Recent Labs    08/17/20 0453 08/18/20 0340 08/19/20 0330  HGB 13.1 12.5* 12.3*  HCT 41.6 38.2* 37.9*  PLT 127* 121* 102*  LABPROT 44.6* 47.2* 42.0*  INR 4.8* 5.1* 4.4*  CREATININE 1.43* 1.17 1.00     Estimated Creatinine Clearance: 58.8 mL/min (by C-G formula based on SCr of 1 mg/dL).   Medications:  PTA warfarin regimen: 2.5mg  daily except 1.25mg  on Wed and Sat  Assessment: Patient is an 87 y.o M with hx DVT/PE (s/p IVC filter) and afib on warfarin PTA, presented to the ED on 7/3 with c/o diarrhea and vomiting.  Warfarin resumed on admission.  Today, 08/19/2020: - INR is supra-therapeutic at 4.4. LD of warfarin was on 7/9  - hgb 12.3, plts 102K - drug-drug intxns: antibiotics have been stopped  - on regular diet - no bleeding issues noted   Goal of Therapy:  INR 2-3 Monitor platelets by anticoagulation protocol: Yes   Plan:  - hold warfarin dose today d/t elevated INR - daily INR - monitor for s/sx bleeding   Royetta Asal, PharmD, BCPS 08/19/2020 8:48 AM

## 2020-08-19 NOTE — Progress Notes (Signed)
Per lab call, critical INR 4.4. Sent J. Olena Heckle NP message.

## 2020-08-19 NOTE — Progress Notes (Signed)
[name] visited pt. per referral for follow-up from [facility]; pt. lying in bed w/wife [name] at window bench.  Pt. and wife shared that pt. had a kidney infection earlier this Spring that led to a lengthy hospitalization; he is now admitted for an e-coli infection, and says that his symptoms are slowly resolving.  Pt. and wife shared that pt. has been receiving excellent care from the medical team; they appreciate connections built w/staff during last admission continuing into this admission.  Wife and pt. both perceive many 'God moments' in the details of this current hospital admission, and their Darrick Meigs faith has been a Optometrist for them as pt. has been dealing with these infections.  [facility] offered prayer for pt.'s continued healing and for strength for him and his wife.  Pt. and wife would welcome continued chaplain visits if schedule allows.  Lindaann Pascal PRN Chaplain Pager: [phone]

## 2020-08-19 NOTE — Progress Notes (Signed)
Physical Therapy Treatment Patient Details Name: [name] MRN: [id number] DOB: 1931/04/30 Today's Date: 08/19/2020    History of Present Illness 87 y.o. male with medical history significant for CAD, biventricular cardiac pacemaker, s/p TAVR, persistent atrial fibrillation on Coumadin, combined systolic and diastolic HF, HLD, HTN, OSA on CPAP, hx of DVT/PE s/p IVC, hx of prostate cancer s/p radical prostatectomy, recent right renal subcapsular abscess secondary to ESBL in the setting of nonobstructing nephrolithiasis.  Recently started on oral vancomycin for positive C. difficile diarrhea presented 08/09/20 with worsening diarrhea along with vomiting.  On presentation, he was afebrile and normotensive with no leukocytosis.  C. difficile panel was negative.    PT Comments    Patient is good spirits. Made attempt to slide to bed edge but patient unable to tolerate due to pressure and pain so aborted attempt. Patient participated in U and LE exercises very well. Patient  reports  that he feels the hard surface under th air mattress. RN obtained a regular bed. Patient slid to new bed  Continue attempts at sitting and standing as tolerated.   Follow Up Recommendations  Home health PT     Equipment Recommendations  None recommended by PT    Recommendations for Other Services       Precautions / Restrictions Precautions Precaution Comments: very painful periarea.scrotal, don't sheer area, use purple  slide sheet under bed pad ,will  help    Mobility  Bed Mobility               General bed mobility comments: placed purple slide folded in 1/2 under bed pad . HOB raised , made attempt to move legs to bed edge but patient began to complain of buttock pain so had to stop attempts.    Transfers                    Ambulation/Gait                 Stairs             Wheelchair Mobility    Modified Rankin (Stroke Patients Only)       Balance        Sitting balance - Comments: NT                                    Cognition Arousal/Alertness: Awake/alert Behavior During Therapy: WFL for tasks assessed/performed Overall Cognitive Status: History of cognitive impairments - at baseline                                 General Comments: wife does correct patient at times.      Exercises General Exercises - Upper Extremity Shoulder Flexion: Strengthening;Both;Supine;Theraband;10 reps Theraband Level (Shoulder Flexion): Level 2 (Red) Shoulder Extension: Both;10 reps;Strengthening;Theraband Theraband Level (Shoulder Extension): Level 2 (Red) Shoulder ABduction: Strengthening;Theraband;10 reps Theraband Level (Shoulder Abduction): Level 2 (Red) Shoulder Horizontal ADduction: Strengthening;Both;10 reps;Theraband Theraband Level (Shoulder Horizontal Adduction): Level 2 (Red) Elbow Flexion: Both;Theraband;10 reps;Strengthening Elbow Extension: Strengthening;10 reps;Both;Theraband Theraband Level (Elbow Extension): Level 2 (Red) General Exercises - Lower Extremity Short Arc Quad: AROM;Both;10 reps Heel Slides: AROM;Both;AAROM;20 reps Hip ABduction/ADduction: AROM;Both;10 reps Straight Leg Raises: AROM;Both;10 reps    General Comments        Pertinent Vitals/Pain Faces Pain Scale: Hurts whole lot Pain Location: scrotal and perianal,  has flexiseal when making attempt  to sit up Pain Descriptors / Indicators: Discomfort;Grimacing;Guarding;Moaning Pain Intervention(s): Monitored during session;Repositioned    Home Living                      Prior Function            PT Goals (current goals can now be found in the care plan section) Progress towards PT goals: Progressing toward goals    Frequency    Min 3X/week      PT Plan Current plan remains appropriate    Co-evaluation              AM-PAC PT "6 Clicks" Mobility   Outcome Measure  Help needed turning from your back  to your side while in a flat bed without using bedrails?: Total Help needed moving from lying on your back to sitting on the side of a flat bed without using bedrails?: Total Help needed moving to and from a bed to a chair (including a wheelchair)?: Total Help needed standing up from a chair using your arms (e.g., wheelchair or bedside chair)?: Total Help needed to walk in hospital room?: Total Help needed climbing 3-5 steps with a railing? : Total 6 Click Score: 6    End of Session Equipment Utilized During Treatment: Gait belt Activity Tolerance: Patient tolerated treatment well Patient left: in bed;with call bell/phone within reach;with family/visitor present Nurse Communication: Mobility status PT Visit Diagnosis: Unsteadiness on feet (R26.81);History of falling (Z91.81);Muscle weakness (generalized) (M62.81);Difficulty in walking, not elsewhere classified (R26.2);Pain     Time: 1415-1500 PT Time Calculation (min) (ACUTE ONLY): 45 min  Charges:  $Therapeutic Exercise: 38-52 mins                     Tresa Endo PT Acute Rehabilitation Services Pager [phone] Office [phone]    Claretha Cooper 08/19/2020, 5:30 PM

## 2020-08-19 NOTE — Progress Notes (Signed)
3 RN attempted to assess pt. Pt was sleeping and per wife, they had a "rough night" and wanted him to be left alone. RN informed wife, I'll check back in later. Fall precautions in place, [facility].  0910 Pt still sleeping, wife awake tidying up room. Wife still wanted him to sleep, encouraged RN to not wake as he was "up all night". WCTM.  1021 RN called to room, IV pump beeping. Pt awake, A&Ox3, disoriented to year, kept telling RN it was 2020. Pt assessed per flow sheet, pt repositioned and rectal tube flushed.

## 2020-08-19 NOTE — Progress Notes (Signed)
Daily Progress Note   Patient Name: [name]       Date: 08/19/2020 DOB: 02/04/1932  Age: 87 y.o. MRN#: [id number] Attending Physician: Cherene Altes, MD Primary Care Physician: Shirline Frees, MD Admit Date: 08/08/2020  Reason for Consultation/Follow-up: Establishing goals of care  Subjective: I met today with [name] and his wife, [name].  His daughter, [name], also joined [place] via phone.  We again discussed care plan and the difficult road [name] has faced over the past few months.  Reviewed my discussions with Dr. Graylon Good and Dr. Thereasa Solo with family.  We talked about the fact that he remains very ill, but at the same time, he does appear to be showing some signs of improvement over the past couple of days.  He and his wife both indicate that they would want to continue with medical interventions if he is going to improve, however, he is very clear in noting that he does not want to continue aggressive interventions if it is just going to prolong his life in a functional state that he finds to be unsuitable.  We discussed the unknowns of his condition including how his diarrhea will progress and how this has been a huge burden on his quality of life over the past couple of weeks.  We discussed that a primary goal for his care if he continues to pursue more aggressive interventions or if he decides to transition to more of a comfort based approach is to see how much we can get his diarrhea to improve.  He has completed treatment course of antibiotics for EPEC and Lomotil dose was increased today.  We also reviewed plan to observe off of antibiotics to see how he does clinically to try and determine if underlying abscess remains quiescent or if he begins to clinically show signs  of recurrence.  If he does begin to display signs of recurrence of infection, we talked about plan to reassess at that time to determine if he would want to reinitiate more aggressive antibiotic regimen or focus more on comfort at that point in time.  He and [name] both indicate that he had a very rough time with antibiotic course over the last 12 weeks and there is a high chance he may decide to forego a similar regimen and focus on  comfort if this continues to be an issue moving forward.  We discussed that in light of multiple chronic medical problems that have worsened with this acute problem, care should be focused on interventions that are likely to allow [name] to achieve goal of getting back to home and spending time with family. I discussed with him and his family regarding heroic interventions at the end-of-life and they agree this would not be in line with prior expressed wishes for a natural death or be likely to lead to getting well enough to go back home. They were in agreement with CODE STATUS of DO NOT RESUSCITATE.   Length of Stay: 10  Current Medications: Scheduled Meds:   Chlorhexidine Gluconate Cloth  6 each Topical Daily   cholestyramine light  4 g Oral Q24H   diphenoxylate-atropine  1 tablet Oral QID   isosorbide mononitrate  15 mg Oral QHS   nystatin   Topical TID   pantoprazole (PROTONIX) IV  40 mg Intravenous Q24H   Warfarin - Pharmacist Dosing Inpatient   Does not apply q1600   Zinc Oxide   Topical QID    Continuous Infusions:  sodium chloride 250 mL (08/12/20 1618)   lactated ringers 100 mL/hr at 08/18/20 2252    PRN Meds: sodium chloride, acetaminophen, HYDROmorphone (DILAUDID) injection, lidocaine, ondansetron (ZOFRAN) IV, oxyCODONE, prochlorperazine, sodium chloride flush  Physical Exam    General: Alert, awake, in no acute distress.   Heart: Regular rate and rhythm. No murmur appreciated. Lungs: Good air movement, clear Abdomen: Soft, nontender, nondistended,  positive bowel sounds.   Skin: Warm and dry Neuro: Grossly intact, nonfocal.  Vital Signs: BP (!) 163/78   Pulse 63   Temp 98.2 F (36.8 C)   Resp 20   Ht 5' 8" (1.727 m)   Wt 97.1 kg   SpO2 98%   BMI 32.56 kg/m  SpO2: SpO2: 98 % O2 Device: O2 Device: Room Air O2 Flow Rate:    Intake/output summary:  Intake/Output Summary (Last 24 hours) at 08/19/2020 0646 Last data filed at 08/19/2020 0600 Gross per 24 hour  Intake 2079.48 ml  Output 750 ml  Net 1329.48 ml   LBM: Last BM Date: 08/18/20 Baseline Weight: Weight: 93.4 kg Most recent weight: Weight: 97.1 kg       Palliative Assessment/Data:    Flowsheet Rows    Flowsheet Row Most Recent Value  Intake Tab   Referral Department Hospitalist  Unit at Time of Referral Med/Surg Unit  Palliative Care Primary Diagnosis Sepsis/Infectious Disease  Date Notified 08/15/20  Palliative Care Type Return patient Palliative Care  Reason for referral Clarify Goals of Care  Date of Admission 08/08/20  Date first seen by Palliative Care 08/17/20  # of days Palliative referral response time 2 Day(s)  # of days IP prior to Palliative referral 7  Clinical Assessment   Palliative Performance Scale Score 30%  Psychosocial & Spiritual Assessment   Palliative Care Outcomes   Patient/Family meeting held? Yes  Who was at the meeting? Patient, wife       Patient Active Problem List   Diagnosis Date Noted   Infectious diarrhea    ESBL (extended spectrum beta-lactamase) producing bacteria infection    Diarrhea 08/06/2020   PICC (peripherally inserted central catheter) in place 06/11/2020   Medication monitoring encounter 06/11/2020   Renal abscess    Perirectal abscess 05/15/2020   Pyelonephritis of right kidney 05/15/2020   Presence of IVC filter 05/15/2020   Constipation, chronic 05/15/2020  Cysts of right kidney 05/15/2020   Severe aortic stenosis    OSA on CPAP    History of prostate cancer    History of peptic ulcer     History of kidney stones    GERD    CAD (coronary artery disease), native coronary artery    Arthritis    Bulbous urethral stricture 12/03/2019   Internal hemorrhoids 09/11/2018   Noise effect on both inner ears 08/14/2018   Presbycusis of both ears 08/14/2018   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   CKD (chronic kidney disease), stage III (HCC)    Chronic combined systolic and diastolic heart failure ([place]) 11/19/2017   Cardiac pacemaker in situ 03/14/2017   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) 01/24/2016   OSA treated with BiPAP 11/15/2015   Personal history of malignant neoplasm of prostate 04/05/2015   Hypertensive heart disease 04/05/2015   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[place]) 04/05/2015   CAD (coronary artery disease), native coronary artery 04/05/2015   Aortic valve stenosis 04/05/2015   Persistent atrial fibrillation ([place]) 09/19/2014   Moderate persistent asthma without complication 10/93/2355   Atrial fibrillation, persistent ([place]) 09/19/2014   Angina pectoris ([place]) 09/18/2014   Urinary incontinence 03/04/2014   Obesity (BMI 30-39.9)    Long term (current) use of anticoagulants 10/06/2011   Mixed hyperlipidemia 02/24/2010   Gastro-esophageal reflux disease without esophagitis 09/24/2009    Palliative Care Assessment & Plan   Patient Profile: 87 y.o. male  with past medical history of CAD, biventricular cardiac pacemaker, TAVR, A. fib on Coumadin, systolic and diastolic heart failure, hypertension, hyperlipidemia, OSA, DVT/PE status post IVC, prostate cancer, right renal subcapsular abscess secondary to ESBL that is being treated with 4 weeks of IV antibiotics, concern for C. difficile admitted on 08/08/2020 with worsening diarrhea and vomiting.  He was found to have enteropathogenic E. coli and has been treated with azithromycin.  He is also been receiving antimotility agents and cholestyramine.  He is required rectal  tube as well as Foley due to severe skin breakdown.  Palliative consulted per family request.  Recommendations/Plan: DNR/DNI Continue current care.  Patient and his wife are clear that their desire is to pursue any interventions that are going to add quality time to his life.  Right now, a lot of his quality of life is affected by diarrhea and waiting to see how it progresses over the next couple of days.  We also discussed treatment of underlying abscess and waiting to see how he does clinically off of antibiotics.  If it appears infection has recurred, he would seriously consider foregoing further antibiotics and focusing on comfort at that time. Will continue to follow daily.  If he continues to improve, he would like to continue with current interventions.  If he worsens, however, he and his wife are very open to consideration to shift to full comfort.   Code Status:    Code Status Orders  (From admission, onward)           Start     Ordered   08/18/20 1803  Do not attempt resuscitation (DNR)  Continuous       Question Answer Comment  In the event of cardiac or respiratory ARREST Do not call a "code blue"   In the event of cardiac or respiratory ARREST Do not perform Intubation, CPR, defibrillation or ACLS   In the event of cardiac or respiratory ARREST Use medication by any route, position, wound  care, and other measures to relive pain and suffering. May use oxygen, suction and manual treatment of airway obstruction as needed for comfort.      08/18/20 1802           Code Status History     Date Active Date Inactive Code Status Order ID Comments User Context   08/08/2020 1959 08/18/2020 1802 Full Code [id number]  Orene Desanctis, DO ED   05/24/2020 1117 05/28/2020 1929 DNR [id number]  Loistine Chance, MD Inpatient   05/15/2020 2341 05/24/2020 1117 Full Code [id number]  Orene Desanctis, DO ED   09/12/2019 1151 09/12/2019 1946 Full Code [id number]  Sherren Mocha, MD Inpatient   03/18/2018 1306  03/22/2018 1738 Full Code [id number]  Sherren Mocha, MD Inpatient   02/28/2018 1508 02/28/2018 2010 Full Code [id number]  Sherren Mocha, MD Inpatient   03/14/2017 1700 03/15/2017 1322 Full Code [id number]  Evans Lance, MD Inpatient   09/19/2014 0516 09/19/2014 1505 Full Code [id number]  Alfonso Ramus, MD Inpatient   10/09/2012 2042 10/11/2012 2228 Full Code [id number]  Gearlean Alf, MD Inpatient   09/28/2011 1943 10/04/2011 1600 Full Code [id number]  Jillyn Hidden, RN Inpatient      Advance Directive Documentation    Flowsheet Row Most Recent Value  Type of Advance Directive Healthcare Power of Attorney  Pre-existing out of facility DNR order (yellow form or pink MOST form) Physician notified to receive inpatient order  "MOST" Form in Place? --       Prognosis:  Unable to determine  Discharge Planning: To Be Determined  Care plan was discussed with patient, wife, daughter, Dr. Baxter Flattery, Dr. Thereasa Solo  Thank you for allowing the Palliative Medicine Team to assist in the care of this patient.   Time In: 1720 Time Out: 1805 Total Time 45 Prolonged Time Billed No      Greater than 50%  of this time was spent counseling and coordinating care related to the above assessment and plan.  Micheline Rough, MD  Please contact Palliative Medicine Team phone at [phone] for questions and concerns.

## 2020-08-20 ENCOUNTER — Inpatient Hospital Stay (HOSPITAL_COMMUNITY): Payer: Medicare Other

## 2020-08-20 DIAGNOSIS — Z7189 Other specified counseling: Secondary | ICD-10-CM | POA: Diagnosis not present

## 2020-08-20 DIAGNOSIS — A499 Bacterial infection, unspecified: Secondary | ICD-10-CM | POA: Diagnosis not present

## 2020-08-20 DIAGNOSIS — M7989 Other specified soft tissue disorders: Secondary | ICD-10-CM

## 2020-08-20 DIAGNOSIS — Z515 Encounter for palliative care: Secondary | ICD-10-CM | POA: Diagnosis not present

## 2020-08-20 DIAGNOSIS — A09 Infectious gastroenteritis and colitis, unspecified: Secondary | ICD-10-CM | POA: Diagnosis not present

## 2020-08-20 LAB — CBC
HCT: 36.9 % — ABNORMAL LOW (ref 39.0–52.0)
Hemoglobin: 11.8 g/dL — ABNORMAL LOW (ref 13.0–17.0)
MCH: 25.5 pg — ABNORMAL LOW (ref 26.0–34.0)
MCHC: 32 g/dL (ref 30.0–36.0)
MCV: 79.9 fL — ABNORMAL LOW (ref 80.0–100.0)
Platelets: 105 10*3/uL — ABNORMAL LOW (ref 150–400)
RBC: 4.62 MIL/uL (ref 4.22–5.81)
RDW: 16.7 % — ABNORMAL HIGH (ref 11.5–15.5)
WBC: 9.8 10*3/uL (ref 4.0–10.5)
nRBC: 0 % (ref 0.0–0.2)

## 2020-08-20 LAB — PROTIME-INR
INR: 3.5 — ABNORMAL HIGH (ref 0.8–1.2)
Prothrombin Time: 35 seconds — ABNORMAL HIGH (ref 11.4–15.2)

## 2020-08-20 MED ORDER — DIPHENOXYLATE-ATROPINE 2.5-0.025 MG PO TABS
2.0000 | ORAL_TABLET | Freq: Every day | ORAL | Status: DC
  Administered 2020-08-21 – 2020-08-24 (×4): 2 via ORAL
  Filled 2020-08-20 (×4): qty 2

## 2020-08-20 MED ORDER — PANTOPRAZOLE SODIUM 40 MG PO TBEC
40.0000 mg | DELAYED_RELEASE_TABLET | Freq: Every day | ORAL | Status: DC
  Administered 2020-08-20 – 2020-08-24 (×5): 40 mg via ORAL
  Filled 2020-08-20 (×4): qty 1

## 2020-08-20 MED ORDER — DIPHENOXYLATE-ATROPINE 2.5-0.025 MG PO TABS
1.0000 | ORAL_TABLET | Freq: Three times a day (TID) | ORAL | Status: DC
  Administered 2020-08-20 – 2020-08-24 (×11): 1 via ORAL
  Filled 2020-08-20 (×11): qty 1

## 2020-08-20 NOTE — Progress Notes (Addendum)
ANTICOAGULATION CONSULT NOTE - Follow Up Consult  Pharmacy Consult for warfarin Indication: hx atrial fibrillation, pulmonary embolus, and DVT  Allergies  Allergen Reactions   Darifenacin Nausea Only and Other (See Comments)    dry mouth and dizziness ENABLEX Dizziness Pt denies   Codeine Other (See Comments)   Sulfamethoxazole-Trimethoprim Other (See Comments)   Lisinopril Cough    Pt denies    Patient Measurements: Height: 5\' 8"  (172.7 cm) Weight: 99.3 kg (218 lb 14.7 oz) IBW/kg (Calculated) : 68.4 Heparin Dosing Weight:   Vital Signs: Temp: 97.5 F (36.4 C) (07/15 0454) BP: 142/84 (07/15 0454) Pulse Rate: 63 (07/15 0454)  Labs: Recent Labs    08/18/20 0340 08/19/20 0330 08/20/20 0350  HGB 12.5* 12.3* 11.8*  HCT 38.2* 37.9* 36.9*  PLT 121* 102* 105*  LABPROT 47.2* 42.0* 35.0*  INR 5.1* 4.4* 3.5*  CREATININE 1.17 1.00  --      Estimated Creatinine Clearance: 59.5 mL/min (by C-G formula based on SCr of 1 mg/dL).   Medications:  PTA warfarin regimen: 2.5mg  daily except 1.25mg  on Wed and Sat  Assessment: Patient is an 87 y.o M with hx DVT/PE (s/p IVC filter) and afib on warfarin PTA, presented to the ED on 7/3 with c/o diarrhea and vomiting.  Warfarin resumed on admission.  Today, 08/20/2020: - INR is supra-therapeutic at 3.5 but trending down. LD of warfarin was on 7/9  - hgb 11.8, plts 105K - drug-drug intxns: antibiotics have been stopped  - on regular diet - no bleeding issues noted   Goal of Therapy:  INR 2-3 Monitor platelets by anticoagulation protocol: Yes   Plan:  - hold warfarin dose one more day d/t elevated INR- hopefully can resume low dose tomorrow - daily INR - monitor for s/sx bleeding - IV PPI to PO per protocol  Eudelia Bunch, Pharm.D 08/20/2020 11:51 AM

## 2020-08-20 NOTE — Progress Notes (Signed)
RUE venous duplex has been completed.  Melissa, LPN given preliminary findings.  Results can be found under chart review under CV PROC. 08/20/2020 2:57 PM Donnielle Addison RVT, RDMS

## 2020-08-20 NOTE — Progress Notes (Signed)
Daily Progress Note   Patient Name: [name]       Date: 08/20/2020 DOB: 08/04/1931  Age: 87 y.o. MRN#: [id number] Attending Physician: Cherene Altes, MD Primary Care Physician: Shirline Frees, MD Admit Date: 08/08/2020  Reason for Consultation/Follow-up: Establishing goals of care  Subjective: I met today with [name] and his wife.  He is lying in bed in no distress.  His wife reports that he has had less diarrhea today which is encouraging.  At the same time, he has been feeling a little down because he really wished he would be improving faster than he is.  We discussed plan to continue with current care and continue to follow his clinical course.  Length of Stay: 11  Current Medications: Scheduled Meds:   Chlorhexidine Gluconate Cloth  6 each Topical Daily   cholestyramine light  4 g Oral Q24H   diphenoxylate-atropine  1 tablet Oral QID   feeding supplement  1 Container Oral TID BM   isosorbide mononitrate  15 mg Oral QHS   multivitamin with minerals  1 tablet Oral Daily   nystatin   Topical TID   pantoprazole (PROTONIX) IV  40 mg Intravenous Q24H   Warfarin - Pharmacist Dosing Inpatient   Does not apply q1600   Zinc Oxide   Topical QID    Continuous Infusions:  sodium chloride 250 mL (08/12/20 1618)   lactated ringers 100 mL/hr at 08/19/20 0900    PRN Meds: sodium chloride, acetaminophen, HYDROmorphone (DILAUDID) injection, lidocaine, ondansetron (ZOFRAN) IV, oxyCODONE, prochlorperazine, sodium chloride flush  Physical Exam    General: Alert, awake, in no acute distress.   Heart: Regular rate and rhythm. No murmur appreciated. Lungs: Good air movement, clear Abdomen: Soft, nontender, nondistended, positive bowel sounds.   Skin: Warm and dry Neuro:  Grossly intact, nonfocal.  Vital Signs: BP (!) 142/84   Pulse 63   Temp (!) 97.5 F (36.4 C)   Resp 18   Ht _0  (1.727 m)   Wt 99.3 kg   SpO2 97%   BMI 33.29 kg/m  SpO2: SpO2: 97 % O2 Device: O2 Device: Room Air O2 Flow Rate:    Intake/output summary:  Intake/Output Summary (Last 24 hours) at 08/20/2020 0813 Last data filed at 08/20/2020 0757 Gross per 24 hour  Intake 407.3 ml  Output 650 ml  Net -242.7 ml    LBM: Last BM Date: 08/20/20 Baseline Weight: Weight: 93.4 kg Most recent weight: Weight: 99.3 kg       Palliative Assessment/Data:    Flowsheet Rows    Flowsheet Row Most Recent Value  Intake Tab   Referral Department Hospitalist  Unit at Time of Referral Med/Surg Unit  Palliative Care Primary Diagnosis Sepsis/Infectious Disease  Date Notified 08/15/20  Palliative Care Type Return patient Palliative Care  Reason for referral Clarify Goals of Care  Date of Admission 08/08/20  Date first seen by Palliative Care 08/17/20  # of days Palliative referral response time 2 Day(s)  # of days IP prior to Palliative referral 7  Clinical Assessment   Palliative Performance Scale Score 30%  Psychosocial & Spiritual Assessment   Palliative Care Outcomes   Patient/Family meeting held? Yes  Who was at the meeting? Patient, wife       Patient Active Problem List   Diagnosis Date Noted   Infectious diarrhea    ESBL (extended spectrum beta-lactamase) producing bacteria infection    Diarrhea 08/06/2020   PICC (peripherally inserted central catheter) in place 06/11/2020   Medication monitoring encounter 06/11/2020   Renal abscess    Perirectal abscess 05/15/2020   Pyelonephritis of right kidney 05/15/2020   Presence of IVC filter 05/15/2020   Constipation, chronic 05/15/2020   Cysts of right kidney 05/15/2020   Severe aortic stenosis    OSA on CPAP    History of prostate cancer    History of peptic ulcer    History of kidney stones    GERD    CAD (coronary  artery disease), native coronary artery    Arthritis    Bulbous urethral stricture 12/03/2019   Internal hemorrhoids 09/11/2018   Noise effect on both inner ears 08/14/2018   Presbycusis of both ears 08/14/2018   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   CKD (chronic kidney disease), stage III (HCC)    Chronic combined systolic and diastolic heart failure ([place]) 11/19/2017   Cardiac pacemaker in situ 03/14/2017   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) 01/24/2016   OSA treated with BiPAP 11/15/2015   Personal history of malignant neoplasm of prostate 04/05/2015   Hypertensive heart disease 04/05/2015   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[place]) 04/05/2015   CAD (coronary artery disease), native coronary artery 04/05/2015   Aortic valve stenosis 04/05/2015   Persistent atrial fibrillation ([place]) 09/19/2014   Moderate persistent asthma without complication 38/18/2993   Atrial fibrillation, persistent ([place]) 09/19/2014   Angina pectoris ([place]) 09/18/2014   Urinary incontinence 03/04/2014   Obesity (BMI 30-39.9)    Long term (current) use of anticoagulants 10/06/2011   Mixed hyperlipidemia 02/24/2010   Gastro-esophageal reflux disease without esophagitis 09/24/2009    Palliative Care Assessment & Plan   Patient Profile: 87 y.o. male  with past medical history of CAD, biventricular cardiac pacemaker, TAVR, A. fib on Coumadin, systolic and diastolic heart failure, hypertension, hyperlipidemia, OSA, DVT/PE status post IVC, prostate cancer, right renal subcapsular abscess secondary to ESBL that is being treated with 4 weeks of IV antibiotics, concern for C. difficile admitted on 08/08/2020 with worsening diarrhea and vomiting.  He was found to have enteropathogenic E. coli and has been treated with azithromycin.  He is also been receiving antimotility agents and cholestyramine.  He is required rectal tube as well as Foley due to severe skin breakdown.   Palliative consulted per family  request.  Recommendations/Plan: DNR/DNI Continue current interventions.  Hopefully diarrhea will continue to improve as this has had a tremendous impact on his quality of life. We have discussed treatment of underlying abscess and waiting to see how he does clinically off of antibiotics.  If it appears infection has recurred, he would seriously consider foregoing further antibiotics and focusing on comfort at that time. Will continue to follow daily.  If he continues to improve, he would like to continue with current interventions.  If he worsens, however, he and his wife are very open to consideration to shift to full comfort.   Code Status:    Code Status Orders  (From admission, onward)           Start     Ordered   08/18/20 1803  Do not attempt resuscitation (DNR)  Continuous       Question Answer Comment  In the event of cardiac or respiratory ARREST Do not call a "code blue"   In the event of cardiac or respiratory ARREST Do not perform Intubation, CPR, defibrillation or ACLS   In the event of cardiac or respiratory ARREST Use medication by any route, position, wound care, and other measures to relive pain and suffering. May use oxygen, suction and manual treatment of airway obstruction as needed for comfort.      08/18/20 1802           Code Status History     Date Active Date Inactive Code Status Order ID Comments User Context   08/08/2020 1959 08/18/2020 1802 Full Code [id number]  Orene Desanctis, DO ED   05/24/2020 1117 05/28/2020 1929 DNR [id number]  Loistine Chance, MD Inpatient   05/15/2020 2341 05/24/2020 1117 Full Code [id number]  Orene Desanctis, DO ED   09/12/2019 1151 09/12/2019 1946 Full Code [id number]  Sherren Mocha, MD Inpatient   03/18/2018 1306 03/22/2018 1738 Full Code [id number]  Sherren Mocha, MD Inpatient   02/28/2018 1508 02/28/2018 2010 Full Code [id number]  Sherren Mocha, MD Inpatient   03/14/2017 1700 03/15/2017 1322 Full Code [id number]   Evans Lance, MD Inpatient   09/19/2014 0516 09/19/2014 1505 Full Code [id number]  Alfonso Ramus, MD Inpatient   10/09/2012 2042 10/11/2012 2228 Full Code [id number]  Gearlean Alf, MD Inpatient   09/28/2011 1943 10/04/2011 1600 Full Code [id number]  Jillyn Hidden, RN Inpatient      Advance Directive Documentation    Flowsheet Row Most Recent Value  Type of Advance Directive Healthcare Power of Attorney  Pre-existing out of facility DNR order (yellow form or pink MOST form) Physician notified to receive inpatient order  "MOST" Form in Place? --       Prognosis:  Unable to determine  Discharge Planning: To Be Determined  Care plan was discussed with patient, wife  Thank you for allowing the Palliative Medicine Team to assist in the care of this patient.   Total Time 25 Prolonged Time Billed No  Greater than 50%  of this time was spent counseling and coordinating care related to the above assessment and plan.  Micheline Rough, MD  Please contact Palliative Medicine Team phone at [phone] for questions and concerns.

## 2020-08-20 NOTE — Progress Notes (Signed)
[name]  WSF:[id number] DOB: 06-21-1931 DOA: 08/08/2020 PCP: Shirline Frees, MD    Brief Narrative:  [phone] with a history of CAD, biventricular cardiac pacemaker, status post TAVR, persistent atrial fibrillation on Coumadin, combined systolic and diastolic CHF, HLD, HTN, OSA, DVT/PE status post IVC filter, prostate cancer status post radical prostatectomy, and a recent right renal subcapsular abscess secondary to ESBL (treatment complicated by C. difficile colitis) who presented to the ED with worsening diarrhea and vomiting.  Significant Events:  After admission enteropathogenic E. coli was noted in his stool samples and he was treated with azithromycin.  Antimotility agents and cholestyramine were utilized.  Corrective tube was required.  Foley catheter had to be inserted due to incontinence leading to severe perineal skin irritation.  Consultants:  Palliative Care Infectious Disease  Code Status: NO CODE BLUE   Antimicrobials:  Azithromycin Eravacycline  DVT prophylaxis: Warfarin  Subjective: Affect is flat today.  Otherwise the patient appears to be doing well.  He reports ongoing swelling in the right arm which is uncomfortable.  He continues to enjoy decreased frequency of his stools.  No chest pain or shortness of breath.  Assessment & Plan:  Enteropathogenic E. coli with severe and persistent diarrhea C. difficile negative - completed course of azithromycin -continue Lomotil and cholestyramine - barrier cream to be applied liberally and often -rectal tube reinserted 7/8 - D/C rectal tube today   Moisture associated skin damage with candidiasis Nystatin powder being utilized -Foley catheter has resulted in significant improvement -must continue Foley for now to allow ongoing wound healing  Perinephric abscess ESBL E. Coli Completed 12 weeks of IV ertapenem 08/08/2020 - now off antibiotics per ID  Acute kidney injury on CKD stage IIIa Creatinine has normalized at  this time   Recent Labs  Lab 08/15/20 0518 08/16/20 0434 08/17/20 0453 08/18/20 0340 08/19/20 0330  CREATININE 2.05* 1.92* 1.43* 1.17 1.00     Frequent vomiting Resolved  Remote history of PE/DVT Anticoagulated with Coumadin  Persistent atrial fibrillation Continue Coumadin therapy  Bioprosthetic TAVR Continue Coumadin  Hypomagnesemia Corrected with supplementation  Hypokalemia Cont to supplement as needed and follow trend  Acute hospital delirium Appears much improved at this time / essentially resolved   Thrombocytopenia Due to above - platelet count stable  HTN Blood pressure reasonably controlled  HLD Hold medical therapy until intake more consistent and significant  OSA  Obesity - Body mass index is 33.29 kg/m.    Family Communication: Spoke with wife at bedside Status is: Inpatient  Remains inpatient appropriate because:Inpatient level of care appropriate due to severity of illness  Dispo: The patient is from: Home              Anticipated d/c is to:  unclear              Patient currently is not medically stable to d/c.   Difficult to place patient No  Objective: Blood pressure (!) 142/84, pulse 63, temperature (!) 97.5 F (36.4 C), resp. rate 18, height 5\' 8"  (1.727 m), weight 99.3 kg, SpO2 97 %.  Intake/Output Summary (Last 24 hours) at 08/20/2020 1009 Last data filed at 08/20/2020 0757 Gross per 24 hour  Intake 118 ml  Output 650 ml  Net -532 ml    Filed Weights   08/17/20 0359 08/18/20 0449 08/20/20 0500  Weight: 92.5 kg 97.1 kg 99.3 kg    Examination: General: No acute respiratory distress -flat affect Lungs: CTA B  Cardiovascular: RRR without  murmur Abdomen: NT/ND, soft, BS positive, no rebound Extremities: Trace bilateral lower extremity edema -2+ right upper extremity edema  CBC: Recent Labs  Lab 08/14/20 0516 08/16/20 0434 08/18/20 0340 08/19/20 0330 08/20/20 0350  WBC 11.0*   < > 11.4* 10.4 9.8  NEUTROABS 4.8   --   --   --   --   HGB 15.7   < > 12.5* 12.3* 11.8*  HCT 49.9   < > 38.2* 37.9* 36.9*  MCV 81.9   < > 79.9* 80.3 79.9*  PLT 185   < > 121* 102* 105*   < > = values in this interval not displayed.    Basic Metabolic Panel: Recent Labs  Lab 08/16/20 0434 08/17/20 0453 08/18/20 0340 08/19/20 0330  NA 139 141 142 142  K 3.4* 3.3* 3.4* 3.4*  CL 113* 118* 117* 117*  CO2 18* 17* 21* 21*  GLUCOSE 101* 121* 101* 93  BUN 55* 50* 42* 35*  CREATININE 1.92* 1.43* 1.17 1.00  CALCIUM 9.4 9.1 9.0 9.0  MG 2.3 1.9  --  1.8    GFR: Estimated Creatinine Clearance: 59.5 mL/min (by C-G formula based on SCr of 1 mg/dL).  Liver Function Tests: No results for input(s): AST, ALT, ALKPHOS, BILITOT, PROT, ALBUMIN in the last 168 hours. No results for input(s): LIPASE, AMYLASE in the last 168 hours. No results for input(s): AMMONIA in the last 168 hours.  Coagulation Profile: Recent Labs  Lab 08/16/20 0434 08/17/20 0453 08/18/20 0340 08/19/20 0330 08/20/20 0350  INR 4.5* 4.8* 5.1* 4.4* 3.5*     HbA1C: Hgb A1c MFr Bld  Date/Time Value Ref Range Status  03/18/2018 07:46 PM 6.3 (H) 4.8 - 5.6 % Final    Comment:    (NOTE) Pre diabetes:          5.7%-6.4% Diabetes:              >6.4% Glycemic control for   <7.0% adults with diabetes     No results found for this or any previous visit (from the past 240 hour(s)).    Scheduled Meds:  Chlorhexidine Gluconate Cloth  6 each Topical Daily   cholestyramine light  4 g Oral Q24H   diphenoxylate-atropine  1 tablet Oral QID   feeding supplement  1 Container Oral TID BM   isosorbide mononitrate  15 mg Oral QHS   multivitamin with minerals  1 tablet Oral Daily   nystatin   Topical TID   pantoprazole (PROTONIX) IV  40 mg Intravenous Q24H   Warfarin - Pharmacist Dosing Inpatient   Does not apply q1600   Zinc Oxide   Topical QID   Continuous Infusions:  sodium chloride 250 mL (08/12/20 1618)   lactated ringers 100 mL/hr at 08/19/20 0900      LOS: 11 days   Cherene Altes, MD [facility] Office  [phone] Pager - Text Page per Shea Evans  If 7PM-7AM, please contact night-coverage per Amion 08/20/2020, 10:09 AM

## 2020-08-21 DIAGNOSIS — A09 Infectious gastroenteritis and colitis, unspecified: Secondary | ICD-10-CM | POA: Diagnosis not present

## 2020-08-21 DIAGNOSIS — Z7189 Other specified counseling: Secondary | ICD-10-CM | POA: Diagnosis not present

## 2020-08-21 DIAGNOSIS — A499 Bacterial infection, unspecified: Secondary | ICD-10-CM | POA: Diagnosis not present

## 2020-08-21 DIAGNOSIS — Z515 Encounter for palliative care: Secondary | ICD-10-CM | POA: Diagnosis not present

## 2020-08-21 LAB — PROTIME-INR
INR: 2.4 — ABNORMAL HIGH (ref 0.8–1.2)
Prothrombin Time: 26.1 seconds — ABNORMAL HIGH (ref 11.4–15.2)

## 2020-08-21 LAB — CBC
HCT: 39.8 % (ref 39.0–52.0)
Hemoglobin: 12.7 g/dL — ABNORMAL LOW (ref 13.0–17.0)
MCH: 25.7 pg — ABNORMAL LOW (ref 26.0–34.0)
MCHC: 31.9 g/dL (ref 30.0–36.0)
MCV: 80.4 fL (ref 80.0–100.0)
Platelets: 117 10*3/uL — ABNORMAL LOW (ref 150–400)
RBC: 4.95 MIL/uL (ref 4.22–5.81)
RDW: 17.1 % — ABNORMAL HIGH (ref 11.5–15.5)
WBC: 7.3 10*3/uL (ref 4.0–10.5)
nRBC: 0 % (ref 0.0–0.2)

## 2020-08-21 MED ORDER — WARFARIN SODIUM 2.5 MG PO TABS
1.2500 mg | ORAL_TABLET | Freq: Once | ORAL | Status: AC
  Administered 2020-08-21: 1.25 mg via ORAL
  Filled 2020-08-21: qty 1
  Filled 2020-08-21: qty 0.5

## 2020-08-21 NOTE — Progress Notes (Signed)
Patient refused blood draw this morning, States he would like to speak with the rounding doctor first before blood is drawn. On call made aware. Wife at the bedside .Will continue to monitor.

## 2020-08-21 NOTE — Progress Notes (Signed)
ANTICOAGULATION CONSULT NOTE - Follow Up Consult  Pharmacy Consult for warfarin Indication: hx atrial fibrillation, pulmonary embolus, and DVT  Allergies  Allergen Reactions   Darifenacin Nausea Only and Other (See Comments)    dry mouth and dizziness ENABLEX Dizziness Pt denies   Codeine Other (See Comments)   Sulfamethoxazole-Trimethoprim Other (See Comments)   Lisinopril Cough    Pt denies    Patient Measurements: Height: 5\' 8"  (172.7 cm) Weight: 99.3 kg (218 lb 14.7 oz) IBW/kg (Calculated) : 68.4 Heparin Dosing Weight:   Vital Signs: Temp: 97.8 F (36.6 C) (07/16 0603) BP: 144/83 (07/16 0603) Pulse Rate: 61 (07/16 0603)  Labs: Recent Labs    08/19/20 0330 08/20/20 0350 08/21/20 1146  HGB 12.3* 11.8* 12.7*  HCT 37.9* 36.9* 39.8  PLT 102* 105* 117*  LABPROT 42.0* 35.0* 26.1*  INR 4.4* 3.5* 2.4*  CREATININE 1.00  --   --     Estimated Creatinine Clearance: 59.5 mL/min (by C-G formula based on SCr of 1 mg/dL).   Medications:  PTA warfarin regimen: 2.5mg  daily except 1.25mg  on Wed and Sat  Assessment: Patient is an 87 y.o M with hx DVT/PE (s/p IVC filter) and afib on warfarin PTA, presented to the ED on 7/3 with c/o diarrhea and vomiting.  Warfarin resumed on admission.  Today, 08/21/2020: - INR is down to therapeutic range at 2.4. LD of warfarin was on 7/9  - CBC stable - drug-drug intxns: antibiotics have been stopped  - on regular diet - significant bleeding from PICC removal site stopped with compression  Goal of Therapy:  INR 2-3 Monitor platelets by anticoagulation protocol: Yes   Plan:  - Warfarin 1.25 mg today - daily INR - monitor for s/sx bleeding  Napoleon Form  08/21/2020 12:31 PM

## 2020-08-21 NOTE — Progress Notes (Signed)
[name]  LYY:[id number] DOB: 1932/01/23 DOA: 08/08/2020 PCP: Shirline Frees, MD    Brief Narrative:  [phone] with a history of CAD, biventricular cardiac pacemaker, status post TAVR, persistent atrial fibrillation on Coumadin, combined systolic and diastolic CHF, HLD, HTN, OSA, DVT/PE status post IVC filter, prostate cancer status post radical prostatectomy, and a recent right renal subcapsular abscess secondary to ESBL (treatment complicated by C. difficile colitis) who presented to the ED with worsening diarrhea and vomiting.  Significant Events:  After admission enteropathogenic E. coli was noted in his stool samples and he was treated with azithromycin.  Antimotility agents and cholestyramine were utilized.  Rectal tube was required due to profuse watery diarrhea.  Foley catheter had to be inserted due to incontinence leading to severe perineal skin irritation.  Consultants:  Palliative Care Infectious Disease  Code Status: NO CODE BLUE   Antimicrobials:  Azithromycin Eravacycline  DVT prophylaxis: Warfarin  Subjective: Patient refused blood draw for labs this morning.  Resting comfortably in bed.  Reports decreased edema in right arm since PICC line removed.  Denies fevers or chills.  Was able to get up to bedside commode for bowel movement today.  No shortness of breath or chest pain.  Assessment & Plan:  Enteropathogenic E. coli with severe and persistent diarrhea C. difficile negative - completed course of azithromycin - continue Lomotil and cholestyramine - barrier cream to be applied liberally and often -rectal tube able to be discontinued 7/15 with marked decrease frequency of stools  Moisture associated skin damage with candidiasis Nystatin powder being utilized -Foley catheter has resulted in significant improvement -must continue Foley for now to allow ongoing wound healing, but if is able to continue to get up to bedside commode with assistance I am hopeful Foley  can be removed 7/17  Perinephric abscess ESBL E. Coli Completed 12 weeks of IV ertapenem 08/08/2020 - now off antibiotics per ID  Acute kidney injury on CKD stage IIIa Creatinine has normalized at this time   Frequent vomiting Resolved  Remote history of PE/DVT Anticoagulated with Coumadin  Catheter associated DVT R UE Despite being supra-therapeutic on warfarin - PICC removed -follow serial exam with edema improved today  Persistent atrial fibrillation Continue Coumadin therapy  Bioprosthetic TAVR Continue Coumadin  Hypomagnesemia Corrected with supplementation  Hypokalemia Cont to supplement as needed and follow trend  Acute hospital delirium Appears much improved at this time / essentially resolved   Thrombocytopenia Due to above - platelet count stable  HTN Blood pressure reasonably controlled  HLD Hold medical therapy until intake more consistent and significant  OSA  Obesity - Body mass index is 33.29 kg/m.    Family Communication: Spoke with wife at bedside Status is: Inpatient  Remains inpatient appropriate because:Inpatient level of care appropriate due to severity of illness  Dispo: The patient is from: Home              Anticipated d/c is to:  unclear              Patient currently is not medically stable to d/c.   Difficult to place patient No  Objective: Blood pressure (!) 144/83, pulse 61, temperature 97.8 F (36.6 C), resp. rate 18, height 5\' 8"  (1.727 m), weight 99.3 kg, SpO2 97 %. No intake or output data in the 24 hours ending 08/21/20 1006  Filed Weights   08/17/20 0359 08/18/20 0449 08/20/20 0500  Weight: 92.5 kg 97.1 kg 99.3 kg    Examination: General: No  acute respiratory distress Lungs: CTA B without wheezing Cardiovascular: RRR without murmur or rub Abdomen: NT/ND, soft, BS positive, no rebound Extremities: Trace bilateral lower extremity edema - 1+ right upper extremity edema  CBC: Recent Labs  Lab 08/18/20 0340  08/19/20 0330 08/20/20 0350  WBC 11.4* 10.4 9.8  HGB 12.5* 12.3* 11.8*  HCT 38.2* 37.9* 36.9*  MCV 79.9* 80.3 79.9*  PLT 121* 102* 105*    Basic Metabolic Panel: Recent Labs  Lab 08/16/20 0434 08/17/20 0453 08/18/20 0340 08/19/20 0330  NA 139 141 142 142  K 3.4* 3.3* 3.4* 3.4*  CL 113* 118* 117* 117*  CO2 18* 17* 21* 21*  GLUCOSE 101* 121* 101* 93  BUN 55* 50* 42* 35*  CREATININE 1.92* 1.43* 1.17 1.00  CALCIUM 9.4 9.1 9.0 9.0  MG 2.3 1.9  --  1.8    GFR: Estimated Creatinine Clearance: 59.5 mL/min (by C-G formula based on SCr of 1 mg/dL).  Liver Function Tests: No results for input(s): AST, ALT, ALKPHOS, BILITOT, PROT, ALBUMIN in the last 168 hours. No results for input(s): LIPASE, AMYLASE in the last 168 hours. No results for input(s): AMMONIA in the last 168 hours.  Coagulation Profile: Recent Labs  Lab 08/16/20 0434 08/17/20 0453 08/18/20 0340 08/19/20 0330 08/20/20 0350  INR 4.5* 4.8* 5.1* 4.4* 3.5*     HbA1C: Hgb A1c MFr Bld  Date/Time Value Ref Range Status  03/18/2018 07:46 PM 6.3 (H) 4.8 - 5.6 % Final    Comment:    (NOTE) Pre diabetes:          5.7%-6.4% Diabetes:              >6.4% Glycemic control for   <7.0% adults with diabetes     No results found for this or any previous visit (from the past 240 hour(s)).    Scheduled Meds:  Chlorhexidine Gluconate Cloth  6 each Topical Daily   cholestyramine light  4 g Oral Q24H   diphenoxylate-atropine  1 tablet Oral TID   diphenoxylate-atropine  2 tablet Oral Q0600   feeding supplement  1 Container Oral TID BM   isosorbide mononitrate  15 mg Oral QHS   multivitamin with minerals  1 tablet Oral Daily   nystatin   Topical TID   pantoprazole  40 mg Oral QHS   Warfarin - Pharmacist Dosing Inpatient   Does not apply q1600   Zinc Oxide   Topical QID      LOS: 12 days   Cherene Altes, MD [facility] Office  [phone] Pager - Text Page per Shea Evans  If 7PM-7AM, please contact  night-coverage per Amion 08/21/2020, 10:06 AM

## 2020-08-21 NOTE — Progress Notes (Signed)
Daily Progress Note   Patient Name: [name]       Date: 08/21/2020 DOB: 04/12/1931  Age: 87 y.o. MRN#: [id number] Attending Physician: Cherene Altes, MD Primary Care Physician: Shirline Frees, MD Admit Date: 08/08/2020  Reason for Consultation/Follow-up: Establishing goals of care  Subjective: I was sitting in the hall shortly after PICC line was removed when his nurse informed that he was having bleeding from PICC line removal site.  I joined multiple staff members in his room and we took down the dressing that was in place and noted dressing was soaked with blood and blood was pulling in his bed.  I provided approximately 10 minutes of direct compression after which we redressed with a compression bandage.  Bleeding was stopped at that time.  We also discussed course.  He is in good spirits other than being frustrated with bleeding.  His diarrhea continues to improve.  We discussed plan to continue with current course.   Length of Stay: 12  Current Medications: Scheduled Meds:   Chlorhexidine Gluconate Cloth  6 each Topical Daily   cholestyramine light  4 g Oral Q24H   diphenoxylate-atropine  1 tablet Oral TID   diphenoxylate-atropine  2 tablet Oral Q0600   feeding supplement  1 Container Oral TID BM   isosorbide mononitrate  15 mg Oral QHS   multivitamin with minerals  1 tablet Oral Daily   nystatin   Topical TID   pantoprazole  40 mg Oral QHS   Warfarin - Pharmacist Dosing Inpatient   Does not apply q1600   Zinc Oxide   Topical QID    Continuous Infusions:    PRN Meds: acetaminophen, HYDROmorphone (DILAUDID) injection, lidocaine, ondansetron (ZOFRAN) IV, oxyCODONE, prochlorperazine, sodium chloride flush  Physical Exam    General: Alert, awake, in no  acute distress.   Heart: Regular rate and rhythm. No murmur appreciated. Lungs: Good air movement, clear Abdomen: Soft, nontender, nondistended, positive bowel sounds.   Skin: Warm and dry, bleeding at PICC line removal site Neuro: Grossly intact, nonfocal.  Vital Signs: BP (!) 144/83   Pulse 61   Temp 97.8 F (36.6 C)   Resp 18   Ht 5\' 8"  (1.727 m)   Wt 99.3 kg   SpO2 97%   BMI 33.29 kg/m  SpO2: SpO2: 97 %  O2 Device: O2 Device: Room Air O2 Flow Rate:    Intake/output summary:  Intake/Output Summary (Last 24 hours) at 08/21/2020 1158 Last data filed at 08/21/2020 1100 Gross per 24 hour  Intake --  Output 850 ml  Net -850 ml    LBM: Last BM Date: 08/20/20 Baseline Weight: Weight: 93.4 kg Most recent weight: Weight: 99.3 kg       Palliative Assessment/Data:    Flowsheet Rows    Flowsheet Row Most Recent Value  Intake Tab   Referral Department Hospitalist  Unit at Time of Referral Med/Surg Unit  Palliative Care Primary Diagnosis Sepsis/Infectious Disease  Date Notified 08/15/20  Palliative Care Type Return patient Palliative Care  Reason for referral Clarify Goals of Care  Date of Admission 08/08/20  Date first seen by Palliative Care 08/17/20  # of days Palliative referral response time 2 Day(s)  # of days IP prior to Palliative referral 7  Clinical Assessment   Palliative Performance Scale Score 30%  Psychosocial & Spiritual Assessment   Palliative Care Outcomes   Patient/Family meeting held? Yes  Who was at the meeting? Patient, wife       Patient Active Problem List   Diagnosis Date Noted   Infectious diarrhea    ESBL (extended spectrum beta-lactamase) producing bacteria infection    Diarrhea 08/06/2020   PICC (peripherally inserted central catheter) in place 06/11/2020   Medication monitoring encounter 06/11/2020   Renal abscess    Perirectal abscess 05/15/2020   Pyelonephritis of right kidney 05/15/2020   Presence of IVC filter 05/15/2020    Constipation, chronic 05/15/2020   Cysts of right kidney 05/15/2020   Severe aortic stenosis    OSA on CPAP    History of prostate cancer    History of peptic ulcer    History of kidney stones    GERD    CAD (coronary artery disease), native coronary artery    Arthritis    Bulbous urethral stricture 12/03/2019   Internal hemorrhoids 09/11/2018   Noise effect on both inner ears 08/14/2018   Presbycusis of both ears 08/14/2018   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   CKD (chronic kidney disease), stage III (HCC)    Chronic combined systolic and diastolic heart failure ([place]) 11/19/2017   Cardiac pacemaker in situ 03/14/2017   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) 01/24/2016   OSA treated with BiPAP 11/15/2015   Personal history of malignant neoplasm of prostate 04/05/2015   Hypertensive heart disease 04/05/2015   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[place]) 04/05/2015   CAD (coronary artery disease), native coronary artery 04/05/2015   Aortic valve stenosis 04/05/2015   Persistent atrial fibrillation ([place]) 09/19/2014   Moderate persistent asthma without complication 35/36/1443   Atrial fibrillation, persistent ([place]) 09/19/2014   Angina pectoris ([place]) 09/18/2014   Urinary incontinence 03/04/2014   Obesity (BMI 30-39.9)    Long term (current) use of anticoagulants 10/06/2011   Mixed hyperlipidemia 02/24/2010   Gastro-esophageal reflux disease without esophagitis 09/24/2009    Palliative Care Assessment & Plan   Patient Profile: 87 y.o. male  with past medical history of CAD, biventricular cardiac pacemaker, TAVR, A. fib on Coumadin, systolic and diastolic heart failure, hypertension, hyperlipidemia, OSA, DVT/PE status post IVC, prostate cancer, right renal subcapsular abscess secondary to ESBL that is being treated with 4 weeks of IV antibiotics, concern for C. difficile admitted on 08/08/2020 with worsening diarrhea and vomiting.  He  was found to have enteropathogenic E.  coli and has been treated with azithromycin.  He is also been receiving antimotility agents and cholestyramine.  He is required rectal tube as well as Foley due to severe skin breakdown.  Palliative consulted per family request.  Recommendations/Plan: DNR/DNI Continue current interventions.  Overall he feels he is slowly improving day by day. We have discussed treatment of underlying abscess and waiting to see how he does clinically off of antibiotics.  If it appears infection has recurred, he would seriously consider foregoing further antibiotics and focusing on comfort at that time. Continue to monitor site of PICC line removal closely. Will continue to follow daily.  If he continues to improve, he would like to continue with current interventions.  If he worsens, however, he and his wife are very open to consideration to shift to full comfort.   Code Status:    Code Status Orders  (From admission, onward)           Start     Ordered   08/18/20 1803  Do not attempt resuscitation (DNR)  Continuous       Question Answer Comment  In the event of cardiac or respiratory ARREST Do not call a "code blue"   In the event of cardiac or respiratory ARREST Do not perform Intubation, CPR, defibrillation or ACLS   In the event of cardiac or respiratory ARREST Use medication by any route, position, wound care, and other measures to relive pain and suffering. May use oxygen, suction and manual treatment of airway obstruction as needed for comfort.      08/18/20 1802           Code Status History     Date Active Date Inactive Code Status Order ID Comments User Context   08/08/2020 1959 08/18/2020 1802 Full Code [id number]  Orene Desanctis, DO ED   05/24/2020 1117 05/28/2020 1929 DNR [id number]  Loistine Chance, MD Inpatient   05/15/2020 2341 05/24/2020 1117 Full Code [id number]  Orene Desanctis, DO ED   09/12/2019 1151 09/12/2019 1946 Full Code [id number]  Sherren Mocha, MD  Inpatient   03/18/2018 1306 03/22/2018 1738 Full Code [id number]  Sherren Mocha, MD Inpatient   02/28/2018 1508 02/28/2018 2010 Full Code [id number]  Sherren Mocha, MD Inpatient   03/14/2017 1700 03/15/2017 1322 Full Code [id number]  Evans Lance, MD Inpatient   09/19/2014 0516 09/19/2014 1505 Full Code [id number]  Alfonso Ramus, MD Inpatient   10/09/2012 2042 10/11/2012 2228 Full Code [id number]  Gearlean Alf, MD Inpatient   09/28/2011 1943 10/04/2011 1600 Full Code [id number]  Jillyn Hidden, RN Inpatient      Advance Directive Documentation    Flowsheet Row Most Recent Value  Type of Advance Directive Healthcare Power of Attorney  Pre-existing out of facility DNR order (yellow form or pink MOST form) Physician notified to receive inpatient order  "MOST" Form in Place? --       Prognosis:  Unable to determine  Discharge Planning: To Be Determined  Care plan was discussed with patient, wife, daughter, bedside care team  Thank you for allowing the Palliative Medicine Team to assist in the care of this patient.   Total Time 40 Prolonged Time Billed No  Greater than 50%  of this time was spent counseling and coordinating care related to the above assessment and plan.  Micheline Rough, MD  Please contact Palliative Medicine Team phone at [phone] for questions and concerns.

## 2020-08-22 DIAGNOSIS — Z7189 Other specified counseling: Secondary | ICD-10-CM | POA: Diagnosis not present

## 2020-08-22 DIAGNOSIS — A09 Infectious gastroenteritis and colitis, unspecified: Secondary | ICD-10-CM | POA: Diagnosis not present

## 2020-08-22 DIAGNOSIS — A499 Bacterial infection, unspecified: Secondary | ICD-10-CM | POA: Diagnosis not present

## 2020-08-22 DIAGNOSIS — Z515 Encounter for palliative care: Secondary | ICD-10-CM | POA: Diagnosis not present

## 2020-08-22 LAB — PROTIME-INR
INR: 2.4 — ABNORMAL HIGH (ref 0.8–1.2)
Prothrombin Time: 26.3 seconds — ABNORMAL HIGH (ref 11.4–15.2)

## 2020-08-22 MED ORDER — WARFARIN SODIUM 2.5 MG PO TABS
1.2500 mg | ORAL_TABLET | Freq: Once | ORAL | Status: AC
  Administered 2020-08-22: 1.25 mg via ORAL
  Filled 2020-08-22: qty 0.5

## 2020-08-22 NOTE — Progress Notes (Signed)
ANTICOAGULATION CONSULT NOTE - Follow Up Consult  Pharmacy Consult for warfarin Indication: hx atrial fibrillation, pulmonary embolus, and DVT  Allergies  Allergen Reactions   Darifenacin Nausea Only and Other (See Comments)    dry mouth and dizziness ENABLEX Dizziness Pt denies   Codeine Other (See Comments)   Sulfamethoxazole-Trimethoprim Other (See Comments)   Lisinopril Cough    Pt denies    Patient Measurements: Height: 5\' 8"  (172.7 cm) Weight: 99.3 kg (218 lb 14.7 oz) IBW/kg (Calculated) : 68.4 Heparin Dosing Weight:   Vital Signs: Temp: 97.5 F (36.4 C) (07/17 0517) BP: 152/77 (07/17 0517) Pulse Rate: 62 (07/17 0517)  Labs: Recent Labs    08/20/20 0350 08/21/20 1146 08/22/20 0551  HGB 11.8* 12.7*  --   HCT 36.9* 39.8  --   PLT 105* 117*  --   LABPROT 35.0* 26.1* 26.3*  INR 3.5* 2.4* 2.4*     Estimated Creatinine Clearance: 59.5 mL/min (by C-G formula based on SCr of 1 mg/dL).   Medications:  PTA warfarin regimen: 2.5mg  daily except 1.25mg  on Wed and Sat  Assessment: Patient is an 87 y.o M with hx DVT/PE (s/p IVC filter) and afib on warfarin PTA, presented to the ED on 7/3 with c/o diarrhea and vomiting.  Warfarin resumed on admission.  Today, 08/22/2020: - INR is down to therapeutic range at 2.4 again today after resuming low dose warfarin yesterday.   - CBC stable yesterday - drug-drug intxns: antibiotics have been stopped  - on regular diet - significant bleeding from PICC removal site stopped with compression  Goal of Therapy:  INR 2-3 Monitor platelets by anticoagulation protocol: Yes   Plan:  - Repeat warfarin 1.25 mg today - daily INR - monitor for s/sx bleeding  Peggyann Juba, PharmD, BCPS Pharmacy: [phone]  08/22/2020 8:07 AM

## 2020-08-22 NOTE — Progress Notes (Signed)
Daily Progress Note   Patient Name: [name]       Date: 08/22/2020 DOB: [birth year]  Age: 87 y.o. MRN#: [id number] Attending Physician: Cherene Altes, MD Primary Care Physician: Shirline Frees, MD Admit Date: 08/08/2020  Reason for Consultation/Follow-up: Establishing goals of care  Subjective: I saw and examined [name] today.  He was lying in bed and in good spirits.  Wife is at the bedside.  We discussed continued improvement we have seen and that while it remains important to continue to assess, he is improving on a daily basis.  She reports her only concern is that he is having a lot more breakdown around his bottom.  Plan has been to work to remove Foley, but she reports wanting to ensure his skin is sufficiently healed prior to being exposed to more moisture.  Length of Stay: 13  Current Medications: Scheduled Meds:   Chlorhexidine Gluconate Cloth  6 each Topical Daily   cholestyramine light  4 g Oral Q24H   diphenoxylate-atropine  1 tablet Oral TID   diphenoxylate-atropine  2 tablet Oral Q0600   feeding supplement  1 Container Oral TID BM   isosorbide mononitrate  15 mg Oral QHS   multivitamin with minerals  1 tablet Oral Daily   nystatin   Topical TID   pantoprazole  40 mg Oral QHS   Warfarin - Pharmacist Dosing Inpatient   Does not apply q1600   Zinc Oxide   Topical QID    Continuous Infusions:    PRN Meds: acetaminophen, HYDROmorphone (DILAUDID) injection, lidocaine, ondansetron (ZOFRAN) IV, oxyCODONE, prochlorperazine, sodium chloride flush  Physical Exam    General: Alert, awake, in no acute distress.   Heart: Regular rate and rhythm. No murmur appreciated. Lungs: Good air movement, clear Abdomen: Soft, nontender, nondistended, positive bowel  sounds.   Skin: Warm and dry, no bleeding at PICC line removal site Neuro: Grossly intact, nonfocal.  Vital Signs: BP (!) 152/77   Pulse 62   Temp (!) 97.5 F (36.4 C)   Resp 18   Ht 5\' 8"  (1.727 m)   Wt 99.3 kg   SpO2 99%   BMI 33.29 kg/m  SpO2: SpO2: 99 % O2 Device: O2 Device: Room Air O2 Flow Rate:    Intake/output summary:  Intake/Output Summary (Last 24 hours) at 08/22/2020  1062 Last data filed at 08/21/2020 1727 Gross per 24 hour  Intake 473 ml  Output 1100 ml  Net -627 ml    LBM: Last BM Date: 08/21/20 Baseline Weight: Weight: 93.4 kg Most recent weight: Weight: 99.3 kg       Palliative Assessment/Data:    Flowsheet Rows    Flowsheet Row Most Recent Value  Intake Tab   Referral Department Hospitalist  Unit at Time of Referral Med/Surg Unit  Palliative Care Primary Diagnosis Sepsis/Infectious Disease  Date Notified 08/15/20  Palliative Care Type Return patient Palliative Care  Reason for referral Clarify Goals of Care  Date of Admission 08/08/20  Date first seen by Palliative Care 08/17/20  # of days Palliative referral response time 2 Day(s)  # of days IP prior to Palliative referral 7  Clinical Assessment   Palliative Performance Scale Score 30%  Psychosocial & Spiritual Assessment   Palliative Care Outcomes   Patient/Family meeting held? Yes  Who was at the meeting? Patient, wife       Patient Active Problem List   Diagnosis Date Noted   Infectious diarrhea    ESBL (extended spectrum beta-lactamase) producing bacteria infection    Diarrhea 08/06/2020   PICC (peripherally inserted central catheter) in place 06/11/2020   Medication monitoring encounter 06/11/2020   Renal abscess    Perirectal abscess 05/15/2020   Pyelonephritis of right kidney 05/15/2020   Presence of IVC filter 05/15/2020   Constipation, chronic 05/15/2020   Cysts of right kidney 05/15/2020   Severe aortic stenosis    OSA on CPAP    History of prostate cancer     History of peptic ulcer    History of kidney stones    GERD    CAD (coronary artery disease), native coronary artery    Arthritis    Bulbous urethral stricture 12/03/2019   Internal hemorrhoids 09/11/2018   Noise effect on both inner ears 08/14/2018   Presbycusis of both ears 08/14/2018   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   CKD (chronic kidney disease), stage III (HCC)    Chronic combined systolic and diastolic heart failure ([place]) 11/19/2017   Cardiac pacemaker in situ 03/14/2017   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) 01/24/2016   OSA treated with BiPAP 11/15/2015   Personal history of malignant neoplasm of prostate 04/05/2015   Hypertensive heart disease 04/05/2015   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[place]) 04/05/2015   CAD (coronary artery disease), native coronary artery 04/05/2015   Aortic valve stenosis 04/05/2015   Persistent atrial fibrillation ([place]) 09/19/2014   Moderate persistent asthma without complication 69/48/5462   Atrial fibrillation, persistent ([place]) 09/19/2014   Angina pectoris ([place]) 09/18/2014   Urinary incontinence 03/04/2014   Obesity (BMI 30-39.9)    Long term (current) use of anticoagulants 10/06/2011   Mixed hyperlipidemia 02/24/2010   Gastro-esophageal reflux disease without esophagitis 09/24/2009    Palliative Care Assessment & Plan   Patient Profile: 87 y.o. male  with past medical history of CAD, biventricular cardiac pacemaker, TAVR, A. fib on Coumadin, systolic and diastolic heart failure, hypertension, hyperlipidemia, OSA, DVT/PE status post IVC, prostate cancer, right renal subcapsular abscess secondary to ESBL that is being treated with 4 weeks of IV antibiotics, concern for C. difficile admitted on 08/08/2020 with worsening diarrhea and vomiting.  He was found to have enteropathogenic E. coli and has been treated with azithromycin.  He is also been receiving antimotility agents and  cholestyramine.  He is required  rectal tube as well as Foley due to severe skin breakdown.  Palliative consulted per family request.  Recommendations/Plan: DNR/DNI Continue current interventions.  He has been improving daily for the last several days.  Discussed plan to continue to monitor off antibiotics to see how he does in regard to underlying abscess. As he continues to improve and goals are clear, palliative will not round on Mr. Moehring daily.  We will continue to follow peripherally.  Please call if there are specific needs with which we can be of assistance in his care moving forward.  I would recommend outpatient palliative care follow him at time of discharge.   Code Status:    Code Status Orders  (From admission, onward)           Start     Ordered   08/18/20 1803  Do not attempt resuscitation (DNR)  Continuous       Question Answer Comment  In the event of cardiac or respiratory ARREST Do not call a "code blue"   In the event of cardiac or respiratory ARREST Do not perform Intubation, CPR, defibrillation or ACLS   In the event of cardiac or respiratory ARREST Use medication by any route, position, wound care, and other measures to relive pain and suffering. May use oxygen, suction and manual treatment of airway obstruction as needed for comfort.      08/18/20 1802           Code Status History     Date Active Date Inactive Code Status Order ID Comments User Context   08/08/2020 1959 08/18/2020 1802 Full Code [id number]  Orene Desanctis, DO ED   05/24/2020 1117 05/28/2020 1929 DNR [id number]  Loistine Chance, MD Inpatient   05/15/2020 2341 05/24/2020 1117 Full Code [id number]  Orene Desanctis, DO ED   09/12/2019 1151 09/12/2019 1946 Full Code [id number]  Sherren Mocha, MD Inpatient   03/18/2018 1306 03/22/2018 1738 Full Code [id number]  Sherren Mocha, MD Inpatient   02/28/2018 1508 02/28/2018 2010 Full Code [id number]  Sherren Mocha, MD Inpatient   03/14/2017 1700 03/15/2017 1322 Full Code  [id number]  Evans Lance, MD Inpatient   09/19/2014 0516 09/19/2014 1505 Full Code [id number]  Alfonso Ramus, MD Inpatient   10/09/2012 2042 10/11/2012 2228 Full Code [id number]  Gearlean Alf, MD Inpatient   09/28/2011 1943 10/04/2011 1600 Full Code [id number]  Jillyn Hidden, RN Inpatient      Advance Directive Documentation    Flowsheet Row Most Recent Value  Type of Advance Directive Healthcare Power of Attorney  Pre-existing out of facility DNR order (yellow form or pink MOST form) Physician notified to receive inpatient order  "MOST" Form in Place? --       Prognosis:  Unable to determine  Discharge Planning: To Be Determined  Care plan was discussed with patient, wife, daughter, bedside care team  Thank you for allowing the Palliative Medicine Team to assist in the care of this patient.   Total Time 20 Prolonged Time Billed No  Greater than 50%  of this time was spent counseling and coordinating care related to the above assessment and plan.   Micheline Rough, MD  Please contact Palliative Medicine Team phone at [phone] for questions and concerns.

## 2020-08-22 NOTE — Progress Notes (Signed)
[name]  BJS:[id number] DOB: [birth year] DOA: 08/08/2020 PCP: Shirline Frees, MD    Brief Narrative:  [phone] with a history of CAD, biventricular cardiac pacemaker, status post TAVR, persistent atrial fibrillation on Coumadin, combined systolic and diastolic CHF, HLD, HTN, OSA, DVT/PE status post IVC filter, prostate cancer status post radical prostatectomy, and a recent right renal subcapsular abscess secondary to ESBL (treatment complicated by C. difficile colitis) who presented to the ED with worsening diarrhea and vomiting.  Significant Events:  After admission enteropathogenic E. coli was noted in his stool samples and he was treated with azithromycin.  Antimotility agents and cholestyramine were utilized.  Rectal tube was required due to profuse watery diarrhea.  Foley catheter had to be inserted due to incontinence leading to severe perineal skin irritation.  Consultants:  Palliative Care Infectious Disease  Code Status: NO CODE BLUE   Antimicrobials:  Azithromycin Eravacycline  DVT prophylaxis: Warfarin  Subjective: Patient was able to ambulate in the hall using a rolling walker and physical therapy assistance today.  He has no new complaints.  He denies chest pain or shortness of breath.  Assessment & Plan:  Enteropathogenic E. coli with severe and persistent diarrhea C. difficile negative - completed course of azithromycin - continue Lomotil and cholestyramine - barrier cream to be applied liberally and often -rectal tube able to be discontinued 7/15 with marked decrease frequency of stools  Moisture associated skin damage with candidiasis Nystatin powder being utilized -Foley catheter has resulted in significant improvement -must continue Foley for now to allow ongoing wound healing, but if is able to continue to get up to bedside commode with assistance I am hopeful Foley can be removed 7/18  Perinephric abscess ESBL E. Coli Completed 12 weeks of IV ertapenem  08/08/2020 - now off antibiotics per ID  Acute kidney injury on CKD stage IIIa Creatinine has normalized at this time   Frequent vomiting Resolved  Remote history of PE/DVT Anticoagulated with Coumadin  Catheter associated DVT R UE Despite being supra-therapeutic on warfarin - PICC removed -edema nearly resolved on exam today  Persistent atrial fibrillation Continue Coumadin therapy per pharmacy  Bioprosthetic TAVR Continue Coumadin  Hypomagnesemia Corrected with supplementation  Hypokalemia Cont to supplement as needed and follow trend  Acute hospital delirium resolved   Thrombocytopenia Due to above - platelet count stable  HTN Blood pressure reasonably controlled  HLD Hold medical therapy until intake more consistent and significant  OSA  Obesity - Body mass index is 33.29 kg/m.    Family Communication: No family present at time of exam today Status is: Inpatient  Remains inpatient appropriate because:Inpatient level of care appropriate due to severity of illness  Dispo: The patient is from: Home              Anticipated d/c is to:  unclear              Patient currently is not medically stable to d/c.   Difficult to place patient No  Objective: Blood pressure (!) 152/77, pulse 62, temperature (!) 97.5 F (36.4 C), resp. rate 18, height 5\' 8"  (1.727 m), weight 99.3 kg, SpO2 99 %.  Intake/Output Summary (Last 24 hours) at 08/22/2020 1108 Last data filed at 08/22/2020 0900 Gross per 24 hour  Intake 476 ml  Output 250 ml  Net 226 ml    Filed Weights   08/17/20 0359 08/18/20 0449 08/20/20 0500  Weight: 92.5 kg 97.1 kg 99.3 kg    Examination: General: No  acute respiratory distress Lungs: CTA B -no wheezing Cardiovascular: RRR  Abdomen: NT/ND, soft, BS positive, no rebound Extremities: Trace B LE edeam - trace right upper extremity edema  CBC: Recent Labs  Lab 08/19/20 0330 08/20/20 0350 08/21/20 1146  WBC 10.4 9.8 7.3  HGB 12.3* 11.8*  12.7*  HCT 37.9* 36.9* 39.8  MCV 80.3 79.9* 80.4  PLT 102* 105* 117*    Basic Metabolic Panel: Recent Labs  Lab 08/16/20 0434 08/17/20 0453 08/18/20 0340 08/19/20 0330  NA 139 141 142 142  K 3.4* 3.3* 3.4* 3.4*  CL 113* 118* 117* 117*  CO2 18* 17* 21* 21*  GLUCOSE 101* 121* 101* 93  BUN 55* 50* 42* 35*  CREATININE 1.92* 1.43* 1.17 1.00  CALCIUM 9.4 9.1 9.0 9.0  MG 2.3 1.9  --  1.8    GFR: Estimated Creatinine Clearance: 59.5 mL/min (by C-G formula based on SCr of 1 mg/dL).  Liver Function Tests: No results for input(s): AST, ALT, ALKPHOS, BILITOT, PROT, ALBUMIN in the last 168 hours. No results for input(s): LIPASE, AMYLASE in the last 168 hours. No results for input(s): AMMONIA in the last 168 hours.  Coagulation Profile: Recent Labs  Lab 08/18/20 0340 08/19/20 0330 08/20/20 0350 08/21/20 1146 08/22/20 0551  INR 5.1* 4.4* 3.5* 2.4* 2.4*     HbA1C: Hgb A1c MFr Bld  Date/Time Value Ref Range Status  03/18/2018 07:46 PM 6.3 (H) 4.8 - 5.6 % Final    Comment:    (NOTE) Pre diabetes:          5.7%-6.4% Diabetes:              >6.4% Glycemic control for   <7.0% adults with diabetes     No results found for this or any previous visit (from the past 240 hour(s)).    Scheduled Meds:  Chlorhexidine Gluconate Cloth  6 each Topical Daily   cholestyramine light  4 g Oral Q24H   diphenoxylate-atropine  1 tablet Oral TID   diphenoxylate-atropine  2 tablet Oral Q0600   feeding supplement  1 Container Oral TID BM   isosorbide mononitrate  15 mg Oral QHS   multivitamin with minerals  1 tablet Oral Daily   nystatin   Topical TID   pantoprazole  40 mg Oral QHS   warfarin  1.25 mg Oral ONCE-1600   Warfarin - Pharmacist Dosing Inpatient   Does not apply q1600   Zinc Oxide   Topical QID      LOS: 13 days   Cherene Altes, MD [facility] Office  [phone] Pager - Text Page per Shea Evans  If 7PM-7AM, please contact night-coverage per  Amion 08/22/2020, 11:08 AM

## 2020-08-22 NOTE — Progress Notes (Signed)
Physical Therapy Treatment Patient Details Name: [name] MRN: [id number] DOB: [birth year] Today's Date: 08/22/2020    History of Present Illness 87 y.o. male with medical history significant for CAD, biventricular cardiac pacemaker, s/p TAVR, persistent atrial fibrillation on Coumadin, combined systolic and diastolic HF, HLD, HTN, OSA on CPAP, hx of DVT/PE s/p IVC, hx of prostate cancer s/p radical prostatectomy, recent right renal subcapsular abscess secondary to ESBL in the setting of nonobstructing nephrolithiasis.  Recently started on oral vancomycin for positive C. difficile diarrhea presented 08/09/20 with worsening diarrhea along with vomiting.  On presentation, he was afebrile and normotensive with no leukocytosis.  C. difficile panel was negative.    PT Comments    Pt in good spirits and very motivated to mobilize.  Pt assisted to roll OOB to minimize shearing and ambulated limited distance in hall (requiring several seated rest breaks to complete task) before returning to room to perform therex program.  Pt very pleased with progress this am.   Follow Up Recommendations  Home health PT     Equipment Recommendations  None recommended by PT    Recommendations for Other Services       Precautions / Restrictions Precautions Precaution Comments: very painful periarea.scrotal, don't sheer area Restrictions Weight Bearing Restrictions: No    Mobility  Bed Mobility Overal bed mobility: Needs Assistance Bed Mobility: Rolling;Sidelying to Sit;Sit to Sidelying Rolling: Min assist Sidelying to sit: Min assist;Mod assist     Sit to sidelying: Min assist;Mod assist General bed mobility comments: Log rolled in/out of bed with cues for technique    Transfers Overall transfer level: Needs assistance Equipment used: Rolling walker (2 wheeled) Transfers: Sit to/from Stand Sit to Stand: Min assist;+2 safety/equipment         General transfer comment: UP from EOB and x 3  from recliner.  Cues for LE management and use of UEs to self assist  Ambulation/Gait Ambulation/Gait assistance: Min assist;+2 safety/equipment Gait Distance (Feet): 75 Feet Assistive device: Rolling walker (2 wheeled) Gait Pattern/deviations: Step-to pattern;Step-through pattern;Decreased step length - right;Decreased step length - left;Shuffle;Trunk flexed Gait velocity: decr   General Gait Details: cues for posture, position from RW and pace.  2 sitting rest breaks required to complete task   Stairs             Wheelchair Mobility    Modified Rankin (Stroke Patients Only)       Balance Overall balance assessment: Needs assistance Sitting-balance support: Feet supported;Bilateral upper extremity supported Sitting balance-Leahy Scale: Good     Standing balance support: Bilateral upper extremity supported;During functional activity Standing balance-Leahy Scale: Fair Standing balance comment: required UE support                            Cognition Arousal/Alertness: Awake/alert Behavior During Therapy: WFL for tasks assessed/performed Overall Cognitive Status: History of cognitive impairments - at baseline                                        Exercises General Exercises - Lower Extremity Ankle Circles/Pumps: AROM;Both;15 reps;Supine Long Arc Quad: AROM;Both;15 reps;Seated Straight Leg Raises: AROM;Both;20 reps    General Comments        Pertinent Vitals/Pain Pain Assessment: 0-10 Pain Score: 5  Pain Location: scrotal and perianal Pain Descriptors / Indicators: Sore Pain Intervention(s): Limited activity within patient's tolerance;Monitored  during session;Premedicated before session    Home Living                      Prior Function            PT Goals (current goals can now be found in the care plan section) Acute Rehab PT Goals Patient Stated Goal: get up ,walk PT Goal Formulation: With patient/family Time  For Goal Achievement: 08/24/20 Potential to Achieve Goals: Good Progress towards PT goals: Progressing toward goals    Frequency    Min 3X/week      PT Plan Current plan remains appropriate    Co-evaluation              AM-PAC PT "6 Clicks" Mobility   Outcome Measure  Help needed turning from your back to your side while in a flat bed without using bedrails?: A Lot Help needed moving from lying on your back to sitting on the side of a flat bed without using bedrails?: A Lot Help needed moving to and from a bed to a chair (including a wheelchair)?: A Lot Help needed standing up from a chair using your arms (e.g., wheelchair or bedside chair)?: A Lot Help needed to walk in hospital room?: A Lot Help needed climbing 3-5 steps with a railing? : Total 6 Click Score: 11    End of Session Equipment Utilized During Treatment: Gait belt Activity Tolerance: Patient tolerated treatment well Patient left: in bed;with call bell/phone within reach Nurse Communication: Mobility status PT Visit Diagnosis: Unsteadiness on feet (R26.81);History of falling (Z91.81);Muscle weakness (generalized) (M62.81);Difficulty in walking, not elsewhere classified (R26.2);Pain     Time: 1050-1140 PT Time Calculation (min) (ACUTE ONLY): 50 min  Charges:  $Gait Training: 23-37 mins $Therapeutic Exercise: 8-22 mins                     [place] Pager [phone] Office [phone]    Jaythan Hinely 08/22/2020, 12:44 PM

## 2020-08-23 ENCOUNTER — Ambulatory Visit: Payer: Medicare Other | Admitting: Internal Medicine

## 2020-08-23 DIAGNOSIS — Z515 Encounter for palliative care: Secondary | ICD-10-CM | POA: Diagnosis not present

## 2020-08-23 DIAGNOSIS — Z7189 Other specified counseling: Secondary | ICD-10-CM | POA: Diagnosis not present

## 2020-08-23 DIAGNOSIS — A09 Infectious gastroenteritis and colitis, unspecified: Secondary | ICD-10-CM | POA: Diagnosis not present

## 2020-08-23 DIAGNOSIS — A499 Bacterial infection, unspecified: Secondary | ICD-10-CM | POA: Diagnosis not present

## 2020-08-23 LAB — BASIC METABOLIC PANEL
Anion gap: 4 — ABNORMAL LOW (ref 5–15)
BUN: 19 mg/dL (ref 8–23)
CO2: 23 mmol/L (ref 22–32)
Calcium: 8.4 mg/dL — ABNORMAL LOW (ref 8.9–10.3)
Chloride: 110 mmol/L (ref 98–111)
Creatinine, Ser: 0.9 mg/dL (ref 0.61–1.24)
GFR, Estimated: >60 mL/min (ref >60)
Glucose, Bld: 122 mg/dL — ABNORMAL HIGH (ref 70–99)
Potassium: 3.3 mmol/L — ABNORMAL LOW (ref 3.5–5.1)
Sodium: 137 mmol/L (ref 135–145)

## 2020-08-23 LAB — CBC
HCT: 35 % — ABNORMAL LOW (ref 39.0–52.0)
Hemoglobin: 11.2 g/dL — ABNORMAL LOW (ref 13.0–17.0)
MCH: 26 pg (ref 26.0–34.0)
MCHC: 32 g/dL (ref 30.0–36.0)
MCV: 81.2 fL (ref 80.0–100.0)
Platelets: 149 10*3/uL — ABNORMAL LOW (ref 150–400)
RBC: 4.31 MIL/uL (ref 4.22–5.81)
RDW: 17.2 % — ABNORMAL HIGH (ref 11.5–15.5)
WBC: 8 10*3/uL (ref 4.0–10.5)
nRBC: 0 % (ref 0.0–0.2)

## 2020-08-23 LAB — PROTIME-INR
INR: 2 — ABNORMAL HIGH (ref 0.8–1.2)
Prothrombin Time: 22.6 seconds — ABNORMAL HIGH (ref 11.4–15.2)

## 2020-08-23 MED ORDER — WARFARIN SODIUM 2.5 MG PO TABS
2.5000 mg | ORAL_TABLET | Freq: Once | ORAL | Status: AC
  Administered 2020-08-23: 2.5 mg via ORAL
  Filled 2020-08-23: qty 1

## 2020-08-23 MED ORDER — POTASSIUM CHLORIDE CRYS ER 10 MEQ PO TBCR
40.0000 meq | EXTENDED_RELEASE_TABLET | Freq: Every day | ORAL | Status: DC
  Administered 2020-08-23 – 2020-08-25 (×3): 40 meq via ORAL
  Filled 2020-08-23 (×3): qty 4

## 2020-08-23 NOTE — Care Management Important Message (Signed)
Important Message  Patient Details IM Letter given to the Patient. Name: [name] MRN: [id number] Date of Birth: 1931/09/19   Medicare Important Message Given:  Yes     Kerin Salen 08/23/2020, 10:21 AM

## 2020-08-23 NOTE — Progress Notes (Signed)
Physical Therapy Treatment Patient Details Name: [name] MRN: [id number] DOB: [birth year] Today's Date: 08/23/2020    History of Present Illness 87 y.o. male with medical history significant for CAD, biventricular cardiac pacemaker, s/p TAVR, persistent atrial fibrillation on Coumadin, combined systolic and diastolic HF, HLD, HTN, OSA on CPAP, hx of DVT/PE s/p IVC, hx of prostate cancer s/p radical prostatectomy, recent right renal subcapsular abscess secondary to ESBL in the setting of nonobstructing nephrolithiasis.  Recently started on oral vancomycin for positive C. difficile diarrhea presented 08/09/20 with worsening diarrhea along with vomiting.  On presentation, he was afebrile and normotensive with no leukocytosis.  C. difficile panel was negative.    PT Comments    Patient in great spirits. Patient  required min assistance to sit up onto bed edge. Patient  ambulated x 220' using  EVA/bilateral platform RW. Patient tolerated  very well. Plans top DC home with HHPT.    Follow Up Recommendations  Home health PT     Equipment Recommendations  None recommended by PT    Recommendations for Other Services       Precautions / Restrictions Precautions Precautions: Fall Precaution Comments: very painful periarea.scrotal, don't sheer area    Mobility  Bed Mobility     Rolling: Min assist Sidelying to sit: Min assist       General bed mobility comments: patient requiring less assistnace, cues for scooting    Transfers Overall transfer level: Needs assistance Equipment used: Rolling walker (2 wheeled) Transfers: Sit to/from Stand Sit to Stand: Min assist;+2 safety/equipment         General transfer comment: UP from EOB .  Cues for LE management and use of UEs to self assist  Ambulation/Gait Ambulation/Gait assistance: Min assist Gait Distance (Feet): 220 Feet Assistive device: Bilateral platform walker Gait Pattern/deviations: Step-through pattern      General Gait Details: cues for posture, Tending to push RW to left.   Stairs             Wheelchair Mobility    Modified Rankin (Stroke Patients Only)       Balance   Sitting-balance support: Feet supported;Bilateral upper extremity supported Sitting balance-Leahy Scale: Good     Standing balance support: Bilateral upper extremity supported;During functional activity Standing balance-Leahy Scale: Fair Standing balance comment: required UE support                            Cognition   Behavior During Therapy: WFL for tasks assessed/performed Overall Cognitive Status: History of cognitive impairments - at baseline                                        Exercises      General Comments        Pertinent Vitals/Pain Pain Score: 2  Pain Location: scrotal and perianal Pain Descriptors / Indicators: Sore Pain Intervention(s): Monitored during session;Repositioned    Home Living                      Prior Function            PT Goals (current goals can now be found in the care plan section)      Frequency    Min 3X/week      PT Plan Current plan remains appropriate    Co-evaluation  AM-PAC PT "6 Clicks" Mobility   Outcome Measure  Help needed turning from your back to your side while in a flat bed without using bedrails?: A Little Help needed moving from lying on your back to sitting on the side of a flat bed without using bedrails?: A Little Help needed moving to and from a bed to a chair (including a wheelchair)?: A Little Help needed standing up from a chair using your arms (e.g., wheelchair or bedside chair)?: A Little Help needed to walk in hospital room?: A Little Help needed climbing 3-5 steps with a railing? : Total 6 Click Score: 16    End of Session Equipment Utilized During Treatment: Gait belt Activity Tolerance: Patient tolerated treatment well Patient left: in bed;with call  bell/phone within reach Nurse Communication: Mobility status PT Visit Diagnosis: Unsteadiness on feet (R26.81);History of falling (Z91.81);Muscle weakness (generalized) (M62.81);Difficulty in walking, not elsewhere classified (R26.2);Pain     Time: 4315-4008 PT Time Calculation (min) (ACUTE ONLY): 23 min  Charges:  $Gait Training: 23-37 mins                     [place] Pager [phone] Office [phone]    Claretha Cooper 08/23/2020, 4:06 PM

## 2020-08-23 NOTE — Progress Notes (Signed)
Palliative Care Progress Note  I checked in briefly on [name] and his wife.  While he is very encouraged by the improvements that he has made, they still remain very concerned about his bottom healing.   His wife expressed being concerned that his foley will be removed too soon and that he is going to be lying in wetness and have breakdown of his skin.  We discussed higher risk of UTI with foley and continuing to weigh risk vs benefit of foley on a daily basis.  She requested that primary team evaluate his buttocks prior to foley removal.  Noah Rough, MD [place] Team [phone]  NO CHARGE NOTE

## 2020-08-23 NOTE — Progress Notes (Signed)
[name]  TIW:[id number] DOB: 13-Nov-1931 DOA: 08/08/2020 PCP: Shirline Frees, MD    Brief Narrative:  [phone] with a history of CAD, biventricular cardiac pacemaker, status post TAVR, persistent atrial fibrillation on Coumadin, combined systolic and diastolic CHF, HLD, HTN, OSA, DVT/PE status post IVC filter, prostate cancer status post radical prostatectomy, and a recent right renal subcapsular abscess secondary to ESBL (treatment complicated by C. difficile colitis) who presented to the ED with worsening diarrhea and vomiting.  Significant Events:  After admission enteropathogenic E. coli was noted in his stool samples and he was treated with azithromycin.  Antimotility agents and cholestyramine were utilized.  Rectal tube was required due to profuse watery diarrhea.  Foley catheter had to be inserted due to incontinence leading to severe perineal skin irritation.  Consultants:  Palliative Care Infectious Disease  Code Status: NO CODE BLUE   Antimicrobials:  Azithromycin Eravacycline  DVT prophylaxis: Warfarin  Subjective: Has begun to experience leaking around his Foley catheter.  Otherwise has no new complaints.  Swelling in his right arm has essentially resolved.  Appetite is good.  No further diarrhea at present.  Has been working with physical therapy to get out of bed.  Assessment & Plan:  Enteropathogenic E. coli with severe and persistent diarrhea C. difficile negative - completed course of azithromycin - continue Lomotil and cholestyramine - barrier cream to be applied liberally and often -rectal tube able to be discontinued 7/15 with marked decrease frequency of stools  Moisture associated skin damage with candidiasis Nystatin powder being utilized -Foley catheter has resulted in significant improvement -with Foley developing leak today we will discontinue it and attempt to leave it out -spoke with patient and wife at length about measures to decrease moisture of the  perineum and sacrum  Perinephric abscess ESBL E. Coli Completed 12 weeks of IV ertapenem 08/08/2020 - now off antibiotics per ID  Acute kidney injury on CKD stage IIIa Creatinine has normalized at this time   Frequent vomiting Resolved  Remote history of PE/DVT Anticoagulated with Coumadin  Catheter associated DVT R UE Despite being supra-therapeutic on warfarin - PICC removed -edema resolved   Persistent atrial fibrillation Continue Coumadin therapy per pharmacy  Bioprosthetic TAVR Continue Coumadin  Hypomagnesemia Corrected with supplementation  Hypokalemia Cont to supplement as needed and follow trend  Acute hospital delirium resolved   Thrombocytopenia Due to above - platelet count stable/improving  HTN Blood pressure reasonably controlled  HLD Hold medical therapy until intake more consistent and significant  OSA  Obesity - Body mass index is 33.29 kg/m.    Family Communication: Spoke with wife at bedside at length Status is: Inpatient  Remains inpatient appropriate because:Inpatient level of care appropriate due to severity of illness  Dispo: The patient is from: Home              Anticipated d/c is to:  unclear              Patient currently is not medically stable to d/c.   Difficult to place patient No  Objective: Blood pressure 136/70, pulse 60, temperature 98.1 F (36.7 C), temperature source Oral, resp. rate 17, height 5\' 8"  (1.727 m), weight 99.3 kg, SpO2 98 %.  Intake/Output Summary (Last 24 hours) at 08/23/2020 1058 Last data filed at 08/23/2020 0833 Gross per 24 hour  Intake 356 ml  Output 600 ml  Net -244 ml    Filed Weights   08/17/20 0359 08/18/20 0449 08/20/20 0500  Weight: 92.5  kg 97.1 kg 99.3 kg    Examination: General: No acute respiratory distress Lungs: CTA B -no wheezing Cardiovascular: RRR  Abdomen: NT/ND, soft, BS positive, no rebound Extremities: Trace B LE edeam - trace right upper extremity edema GU: Patient  rolled into left lateral decubitus position with sacrum buttocks and perineum revealing erythema but no appreciable cutaneous wound and no drainage  CBC: Recent Labs  Lab 08/20/20 0350 08/21/20 1146 08/23/20 0439  WBC 9.8 7.3 8.0  HGB 11.8* 12.7* 11.2*  HCT 36.9* 39.8 35.0*  MCV 79.9* 80.4 81.2  PLT 105* 117* 149*    Basic Metabolic Panel: Recent Labs  Lab 08/17/20 0453 08/18/20 0340 08/19/20 0330 08/23/20 0439  NA 141 142 142 137  K 3.3* 3.4* 3.4* 3.3*  CL 118* 117* 117* 110  CO2 17* 21* 21* 23  GLUCOSE 121* 101* 93 122*  BUN 50* 42* 35* 19  CREATININE 1.43* 1.17 1.00 0.90  CALCIUM 9.1 9.0 9.0 8.4*  MG 1.9  --  1.8  --     GFR: Estimated Creatinine Clearance: 66.1 mL/min (by C-G formula based on SCr of 0.9 mg/dL).  Liver Function Tests: No results for input(s): AST, ALT, ALKPHOS, BILITOT, PROT, ALBUMIN in the last 168 hours. No results for input(s): LIPASE, AMYLASE in the last 168 hours. No results for input(s): AMMONIA in the last 168 hours.  Coagulation Profile: Recent Labs  Lab 08/19/20 0330 08/20/20 0350 08/21/20 1146 08/22/20 0551 08/23/20 0439  INR 4.4* 3.5* 2.4* 2.4* 2.0*     HbA1C: Hgb A1c MFr Bld  Date/Time Value Ref Range Status  03/18/2018 07:46 PM 6.3 (H) 4.8 - 5.6 % Final    Comment:    (NOTE) Pre diabetes:          5.7%-6.4% Diabetes:              >6.4% Glycemic control for   <7.0% adults with diabetes     No results found for this or any previous visit (from the past 240 hour(s)).    Scheduled Meds:  Chlorhexidine Gluconate Cloth  6 each Topical Daily   cholestyramine light  4 g Oral Q24H   diphenoxylate-atropine  1 tablet Oral TID   diphenoxylate-atropine  2 tablet Oral Q0600   feeding supplement  1 Container Oral TID BM   isosorbide mononitrate  15 mg Oral QHS   multivitamin with minerals  1 tablet Oral Daily   nystatin   Topical TID   pantoprazole  40 mg Oral QHS   Warfarin - Pharmacist Dosing Inpatient   Does not apply  q1600   Zinc Oxide   Topical QID      LOS: 14 days   Cherene Altes, MD [facility] Office  [phone] Pager - Text Page per Shea Evans  If 7PM-7AM, please contact night-coverage per Amion 08/23/2020, 10:58 AM

## 2020-08-23 NOTE — Progress Notes (Signed)
ANTICOAGULATION CONSULT NOTE - Follow Up Consult  Pharmacy Consult for warfarin Indication: hx atrial fibrillation, pulmonary embolus, and DVT  Allergies  Allergen Reactions   Darifenacin Nausea Only and Other (See Comments)    dry mouth and dizziness ENABLEX Dizziness Pt denies   Codeine Other (See Comments)   Sulfamethoxazole-Trimethoprim Other (See Comments)   Lisinopril Cough    Pt denies    Patient Measurements: Height: 5\' 8"  (172.7 cm) Weight: 99.3 kg (218 lb 14.7 oz) IBW/kg (Calculated) : 68.4 Heparin Dosing Weight:   Vital Signs: Temp: 98.1 F (36.7 C) (07/18 0440) Temp Source: Oral (07/18 0440) BP: 136/70 (07/18 0440) Pulse Rate: 60 (07/18 0440)  Labs: Recent Labs    08/21/20 1146 08/22/20 0551 08/23/20 0439  HGB 12.7*  --  11.2*  HCT 39.8  --  35.0*  PLT 117*  --  149*  LABPROT 26.1* 26.3* 22.6*  INR 2.4* 2.4* 2.0*  CREATININE  --   --  0.90     Estimated Creatinine Clearance: 66.1 mL/min (by C-G formula based on SCr of 0.9 mg/dL).   Medications:  PTA warfarin regimen: 2.5mg  daily except 1.25mg  on Wed and Sat  Assessment: Patient is an 87 y.o M with hx DVT/PE (s/p IVC filter) and afib on warfarin PTA, presented to the ED on 7/3 with c/o diarrhea and vomiting.  Warfarin resumed on admission, however was held on 7/10 due to supratherapeutic INR.  Warfarin was restarted on 7/16.  Today, 08/23/2020: - INR is down to 2.0 today, therapeutic but at low end of range.  - CBC stable - Drug-drug intxns: cholestyramine-may reduce warfarin effects; antibiotics have been discontinued - On regular diet: eating ~50-100% of meals  Goal of Therapy:  INR 2-3 Monitor platelets by anticoagulation protocol: Yes   Plan:  - Give warfarin 2.5mg  PO x1 today - Daily INR - Monitor for s/sx bleeding - F/u ability to d/c cholestyramine  Dimple Nanas, PharmD 08/23/2020 8:59 AM

## 2020-08-23 NOTE — Progress Notes (Signed)
Physical Therapy Treatment Patient Details Name: [name] MRN: [id number] DOB: 07-Aug-1931 Today's Date: 08/23/2020    History of Present Illness 87 y.o. male with medical history significant for CAD, biventricular cardiac pacemaker, s/p TAVR, persistent atrial fibrillation on Coumadin, combined systolic and diastolic HF, HLD, HTN, OSA on CPAP, hx of DVT/PE s/p IVC, hx of prostate cancer s/p radical prostatectomy, recent right renal subcapsular abscess secondary to ESBL in the setting of nonobstructing nephrolithiasis.  Recently started on oral vancomycin for positive C. difficile diarrhea presented 08/09/20 with worsening diarrhea along with vomiting.  On presentation, he was afebrile and normotensive with no leukocytosis.  C. difficile panel was negative.    PT Comments    Patient performed active/resistive exercises  in bed as listed.    Follow Up Recommendations  Home health PT     Equipment Recommendations  None recommended by PT    Recommendations for Other Services       Precautions / Restrictions Precautions Precautions: Fall Precaution Comments: very painful periarea.scrotal, don't sheer area    Mobility  Bed Mobility     Modified Rankin (Stroke Patients Only)       Balance         Cognition   Behavior During Therapy: WFL for tasks assessed/performed Overall Cognitive Status: History of cognitive impairments - at baseline                                        Exercises General Exercises - Lower Extremity Ankle Circles/Pumps: AROM;Both;10 reps Short Arc Quad: AROM;Both;10 reps Long Arc Quad: AROM;Both;15 reps;Seated Heel Slides: AROM;Both;AAROM;20 reps Straight Leg Raises: AROM;Both;20 reps Hip Flexion/Marching: AROM;Both;10 reps;Seated    General Comments        Pertinent Vitals/Pain Pain Score: 2  Pain Location: scrotal and perianal Pain Descriptors / Indicators: Sore Pain Intervention(s): Monitored during  session;Repositioned    Home Living                      Prior Function            PT Goals (current goals can now be found in the care plan section) Progress towards PT goals: Progressing toward goals    Frequency    Min 3X/week      PT Plan Current plan remains appropriate    Co-evaluation              AM-PAC PT "6 Clicks" Mobility   Outcome Measure  Help needed turning from your back to your side while in a flat bed without using bedrails?: A Little Help needed moving from lying on your back to sitting on the side of a flat bed without using bedrails?: A Little Help needed moving to and from a bed to a chair (including a wheelchair)?: A Little Help needed standing up from a chair using your arms (e.g., wheelchair or bedside chair)?: A Little Help needed to walk in hospital room?: A Little Help needed climbing 3-5 steps with a railing? : Total 6 Click Score: 16    End of Session Equipment Utilized During Treatment: Gait belt Activity Tolerance: Patient tolerated treatment well Patient left: in bed;with call bell/phone within reach;with bed alarm set Nurse Communication: Mobility status PT Visit Diagnosis: Unsteadiness on feet (R26.81);History of falling (Z91.81);Muscle weakness (generalized) (M62.81);Difficulty in walking, not elsewhere classified (R26.2);Pain     Time: 8099-8338 PT Time Calculation (  min) (ACUTE ONLY): 21 min  Charges:  $Therapeutic Exercise: 8-22 mins                    [name] PT Acute Rehabilitation Services Pager [phone] Office [phone]  [name] 08/23/2020, 4:09 PM

## 2020-08-23 NOTE — Progress Notes (Signed)
    [facility] for Infectious Disease   Reason for visit: Follow up on renal abscess  Interval History: feels well, no nausea, less diarrhea.  WBC wnl, remains afebrile.   Remains off of antibiotics Wife at the bedside  Physical Exam: Constitutional:  Vitals:   08/23/20 0440 08/23/20 1331  BP: 136/70 138/77  Pulse: 60 60  Resp: 17 20  Temp: 98.1 F (36.7 C) 98.3 F (36.8 C)  SpO2: 98% 99%   patient appears in NAD Respiratory: Normal respiratory effort; CTA B Cardiovascular: RRR GU: catheter in place  Review of Systems: Constitutional: negative for fevers and chills Gastrointestinal: negative for nausea and diarrhea Integument/breast: negative for rash  Lab Results  Component Value Date   WBC 8.0 08/23/2020   HGB 11.2 (L) 08/23/2020   HCT 35.0 (L) 08/23/2020   MCV 81.2 08/23/2020   PLT 149 (L) 08/23/2020    Lab Results  Component Value Date   CREATININE 0.90 08/23/2020   BUN 19 08/23/2020   NA 137 08/23/2020   K 3.3 (L) 08/23/2020   CL 110 08/23/2020   CO2 23 08/23/2020    Lab Results  Component Value Date   ALT 6 08/08/2020   AST 17 08/08/2020   ALKPHOS 89 08/08/2020     Microbiology: No results found for this or any previous visit (from the past 240 hour(s)).  Impression/Plan:  1. Diarrhea - seems to have improved well and no further treatment indicated at this time other than supportive measures.    2.  Renal abscess - sp treatment for ESBL E coli.  Doing well now off of treatment with no fever, no new concerns.   Will consider repeat CT in about 2 weeks off antibiotics to reassess.

## 2020-08-24 DIAGNOSIS — Z515 Encounter for palliative care: Secondary | ICD-10-CM | POA: Diagnosis not present

## 2020-08-24 DIAGNOSIS — A09 Infectious gastroenteritis and colitis, unspecified: Secondary | ICD-10-CM | POA: Diagnosis not present

## 2020-08-24 DIAGNOSIS — A499 Bacterial infection, unspecified: Secondary | ICD-10-CM | POA: Diagnosis not present

## 2020-08-24 DIAGNOSIS — N151 Renal and perinephric abscess: Secondary | ICD-10-CM | POA: Diagnosis not present

## 2020-08-24 DIAGNOSIS — Z7189 Other specified counseling: Secondary | ICD-10-CM | POA: Diagnosis not present

## 2020-08-24 LAB — PROTIME-INR
INR: 1.6 — ABNORMAL HIGH (ref 0.8–1.2)
Prothrombin Time: 18.9 seconds — ABNORMAL HIGH (ref 11.4–15.2)

## 2020-08-24 MED ORDER — DIPHENOXYLATE-ATROPINE 2.5-0.025 MG PO TABS: 1.0000 | ORAL_TABLET | Freq: Four times a day (QID) | ORAL | Status: DC | PRN

## 2020-08-24 MED ORDER — WARFARIN SODIUM 4 MG PO TABS
4.0000 mg | ORAL_TABLET | Freq: Once | ORAL | Status: AC
  Administered 2020-08-24: 4 mg via ORAL
  Filled 2020-08-24: qty 1

## 2020-08-24 NOTE — Progress Notes (Addendum)
ANTICOAGULATION CONSULT NOTE - Follow Up Consult  Pharmacy Consult for warfarin Indication: hx atrial fibrillation, pulmonary embolus, and DVT  Allergies  Allergen Reactions   Darifenacin Nausea Only and Other (See Comments)    dry mouth and dizziness ENABLEX Dizziness Pt denies   Codeine Other (See Comments)   Sulfamethoxazole-Trimethoprim Other (See Comments)   Lisinopril Cough    Pt denies    Patient Measurements: Height: 5\' 8"  (172.7 cm) Weight: 99.3 kg (218 lb 14.7 oz) IBW/kg (Calculated) : 68.4 Heparin Dosing Weight:   Vital Signs: Temp: 98 F (36.7 C) (07/19 0337) Temp Source: Oral (07/19 0337) BP: 133/71 (07/19 0337) Pulse Rate: 59 (07/19 0337)  Labs: Recent Labs    08/21/20 1146 08/22/20 0551 08/23/20 0439 08/24/20 0445  HGB 12.7*  --  11.2*  --   HCT 39.8  --  35.0*  --   PLT 117*  --  149*  --   LABPROT 26.1* 26.3* 22.6* 18.9*  INR 2.4* 2.4* 2.0* 1.6*  CREATININE  --   --  0.90  --      Estimated Creatinine Clearance: 66.1 mL/min (by C-G formula based on SCr of 0.9 mg/dL).   Medications:  PTA warfarin regimen: 2.5mg  daily except 1.25mg  on Wed and Sat  Assessment: Patient is an 87 y.o M with hx DVT/PE (s/p IVC filter) and afib on warfarin PTA, presented to the ED on 7/3 with c/o diarrhea and vomiting.  Warfarin resumed on admission, however was held on 7/10 due to supratherapeutic INR.  Warfarin was restarted on 7/16.  Today, 08/24/2020: - INR is subtherapeutic at 1.6, down from 2.0 yesterday (trend: 2.4>2.0>1.6) - CBC stable - Drug-drug intxns: N/A- cholestyramine and antibiotics discontinued - On regular diet: eating ~100% of meals  Goal of Therapy:  INR 2-3 Monitor platelets by anticoagulation protocol: Yes   Plan:  - Give warfarin 4mg  PO x1 today (~1.5x home dose to help augment therapeutic INR) - Daily INR, CBC - Monitor for s/sx bleeding  Dimple Nanas, PharmD 08/24/2020 8:39 AM

## 2020-08-24 NOTE — Evaluation (Signed)
Occupational Therapy Evaluation Patient Details Name: [name] MRN: [id number] DOB: January 31, 1932 Today's Date: 08/24/2020    History of Present Illness 87 y.o. male with medical history significant for CAD, biventricular cardiac pacemaker, s/p TAVR, persistent atrial fibrillation on Coumadin, combined systolic and diastolic HF, HLD, HTN, OSA on CPAP, hx of DVT/PE s/p IVC, hx of prostate cancer s/p radical prostatectomy, recent right renal subcapsular abscess secondary to ESBL in the setting of nonobstructing nephrolithiasis.  Recently started on oral vancomycin for positive C. difficile diarrhea presented 08/09/20 with worsening diarrhea along with vomiting.  On presentation, he was afebrile and normotensive with no leukocytosis.  C. difficile panel was negative.   Clinical Impression   Patient lives at home with spouse, has been working with [facility] PT, [place] [facility] and has [facility] RN coming to house. Spouse assists as needed with ADL tasks such as lower body dressing, toileting. Patient ambulates with stand up rollator at baseline. Patient needing min A x2 for safety to power up to standing from elevated bed, min A for safety ambulating with eva walker however no loss of balance and tolerate ambulating to end of hallway and back without rest break. Patient did need total A for clothing management with brief due to dependent on arm support. Recommend continued acute OT services, spouse hopeful to resume [place] services at D/C.     Follow Up Recommendations  Home health OT;Other (comment) (family would like to continue with [facility])    Equipment Recommendations  None recommended by OT       Precautions / Restrictions Precautions Precautions: Fall Precaution Comments: very painful periarea.scrotal, don't sheer area Restrictions Weight Bearing Restrictions: No      Mobility Bed Mobility Overal bed mobility: Needs Assistance Bed Mobility: Supine to Sit     Supine to sit: Mod assist;HOB elevated     General  bed mobility comments: use of purple slide to minimze sheering to buttock/peri area, mod A for trunk support to sit upright    Transfers Overall transfer level: Needs assistance Equipment used:  (eva walker) Transfers: Sit to/from Stand Sit to Stand: Min assist;+2 safety/equipment;From elevated surface         General transfer comment: bed elevated and min x2 for safety to power up to standing    Balance Overall balance assessment: Needs assistance Sitting-balance support: Feet supported Sitting balance-Leahy Scale: Good     Standing balance support: Bilateral upper extremity supported Standing balance-Leahy Scale: Poor Standing balance comment: reliant on UE support                           ADL either performed or assessed with clinical judgement   ADL Overall ADL's : Needs assistance/impaired     Grooming: Set up;Sitting   Upper Body Bathing: Set up;Sitting   Lower Body Bathing: Moderate assistance;Sitting/lateral leans;Sit to/from stand   Upper Body Dressing : Set up;Sitting   Lower Body Dressing: Total assistance;Sitting/lateral leans;Sit to/from stand Lower Body Dressing Details (indicate cue type and reason): to don/doff pull up Toilet Transfer: Minimal assistance;Ambulation (eva walker) Toilet Transfer Details (indicate cue type and reason): patient eager to participate in functional mobility, min A for safety with eva walker although no loss of balance noted Toileting- Clothing Manipulation and Hygiene: Total assistance;Sit to/from stand;Sitting/lateral lean Toileting - Clothing Manipulation Details (indicate cue type and reason): reliant on UE support thererfore total A for clothing management. spouse reports assists as needed at baseline     Functional  mobility during ADLs: Minimal assistance (eva walker)        Pertinent Vitals/Pain Pain Assessment: Faces Faces Pain Scale: Hurts little more Pain Location: scrotal and perianal Pain  Descriptors / Indicators: Sore Pain Intervention(s): Monitored during session     Hand Dominance Right   Extremity/Trunk Assessment Upper Extremity Assessment Upper Extremity Assessment: Generalized weakness   Lower Extremity Assessment Lower Extremity Assessment: Defer to PT evaluation   Cervical / Trunk Assessment Cervical / Trunk Assessment: Normal   Communication Communication Communication: No difficulties   Cognition Arousal/Alertness: Awake/alert Behavior During Therapy: WFL for tasks assessed/performed Overall Cognitive Status: History of cognitive impairments - at baseline                                 General Comments: following directions appropriately              Home Living Family/patient expects to be discharged to:: Private residence Living Arrangements: Spouse/significant other Available Help at Discharge: Family;Available 24 hours/day Type of Home: House Home Access: Stairs to enter [employer] of Steps: 1   Home Layout: One level     Bathroom Shower/Tub: Occupational psychologist: Handicapped height     Home Equipment: Walker - 4 wheels;Grab bars - tub/shower;Grab bars - toilet;Shower seat - built in          Prior Functioning/Environment Level of Independence: Needs assistance  Gait / Transfers Assistance Needed: walks with rollator with platforms, has had 1 fall with left rib fractures. ADL's / Homemaking Assistance Needed: showers in sitting with wife assisting with transfers, supervising and drying pt, pt is fatigued after showering, wife helps put his clothes on            OT Problem List: Decreased strength;Decreased activity tolerance;Impaired balance (sitting and/or standing);Pain;Decreased safety awareness      OT Treatment/Interventions: Self-care/ADL training;Therapeutic exercise;Therapeutic activities;Patient/family education;Balance training;DME and/or AE instruction    OT Goals(Current  goals can be found in the care plan section) Acute Rehab OT Goals Patient Stated Goal: home soon OT Goal Formulation: With patient/family Time For Goal Achievement: 09/07/20 Potential to Achieve Goals: Good  OT Frequency: Min 2X/week    AM-PAC OT "6 Clicks" Daily Activity     Outcome Measure Help from another person eating meals?: A Little Help from another person taking care of personal grooming?: A Little Help from another person toileting, which includes using toliet, bedpan, or urinal?: A Lot Help from another person bathing (including washing, rinsing, drying)?: A Lot Help from another person to put on and taking off regular upper body clothing?: A Little Help from another person to put on and taking off regular lower body clothing?: Total 6 Click Score: 14   End of Session Equipment Utilized During Treatment: Gait belt;Other (comment) (eva walker) Nurse Communication: Mobility status  Activity Tolerance: Patient tolerated treatment well Patient left: in chair;with call bell/phone within reach;with family/visitor present  OT Visit Diagnosis: Unsteadiness on feet (R26.81);Other abnormalities of gait and mobility (R26.89);Pain Pain - part of body:  (perianal area)                Time: 0865-7846 OT Time Calculation (min): 33 min Charges:  OT General Charges $OT Visit: 1 Visit OT Evaluation $OT Eval Low Complexity: 1 Low OT Treatments $Self Care/Home Management : 8-22 mins  Delbert Phenix OT OT pager: [phone]  Rosemary Holms 08/24/2020, 2:16 PM

## 2020-08-24 NOTE — TOC Transition Note (Signed)
Transition of Care [facility]) - CM/SW Discharge Note   Patient Details  Name: [name] MRN: [id number] Date of Birth: 1931-11-16  [facility] CM/SW Contact:  Trish Mage, LCSW Phone Number: 08/24/2020, 2:18 PM   Clinical Narrative:   Patient seen in follow up to MD consult re: [facility] services.  Patient who will likely d/c tomorrow had wife at bedside, wanted to confirm that they were previously working with [facility] and want to continue working with them. Noted.  Will need MD order for resumption of [place] RN and PT at d/c. No further needs identified. TOC will continue to follow during the course of hospitalization.     Final next level of care: [place] Barriers to Discharge: No Barriers Identified   Patient Goals and CMS Choice        Discharge Placement                       Discharge Plan and Services                                     Social Determinants of Health (SDOH) Interventions     Readmission Risk Interventions Readmission Risk Prevention Plan 05/27/2020  Transportation Screening Complete  Medication Review ([place]) Complete  PCP or Specialist appointment within 3-5 days of discharge Not Complete  PCP/Specialist Appt Not Complete comments Specialist appointment in 7 days  [place] or [place] Not Complete  SW Recovery Care/Counseling Consult Not Complete  Palliative Care Screening Complete  [place] Not Applicable  Some recent data might be hidden

## 2020-08-24 NOTE — Progress Notes (Signed)
Daily Progress Note   Patient Name: [name]       Date: 08/24/2020 DOB: [birth year]  Age: 87 y.o. MRN#: [id number] Attending Physician: Cherene Altes, MD Primary Care Physician: Shirline Frees, MD Admit Date: 08/08/2020  Reason for Consultation/Follow-up: Establishing goals of care  Subjective:  Awake alert, eating breakfast, in no distress. Rested well overnight, appetite is good, did participate with PT, patient's diarrhea has resolved. Wife at bedside. We talked about home with home health care, [facility]. We talked about home based PT, nursing services and potential DME that he might require, see below.   Length of Stay: 15  Current Medications: Scheduled Meds:   Chlorhexidine Gluconate Cloth  6 each Topical Daily   cholestyramine light  4 g Oral Q24H   diphenoxylate-atropine  1 tablet Oral TID   diphenoxylate-atropine  2 tablet Oral Q0600   feeding supplement  1 Container Oral TID BM   isosorbide mononitrate  15 mg Oral QHS   multivitamin with minerals  1 tablet Oral Daily   nystatin   Topical TID   pantoprazole  40 mg Oral QHS   potassium chloride  40 mEq Oral Daily   Warfarin - Pharmacist Dosing Inpatient   Does not apply q1600   Zinc Oxide   Topical QID    Continuous Infusions:   PRN Meds: acetaminophen, lidocaine, ondansetron (ZOFRAN) IV, oxyCODONE, prochlorperazine, sodium chloride flush  Physical Exam         Awake alert Regular unlabored breathing Regular No distress Has some trace edema No focal deficits  Vital Signs: BP 133/71 (BP Location: Left Arm)   Pulse (!) 59   Temp 98 F (36.7 C) (Oral)   Resp 15   Ht 5\' 8"  (1.727 m)   Wt 99.3 kg   SpO2 97%   BMI 33.29 kg/m  SpO2: SpO2: 97 % O2 Device: O2  Device: Room Air O2 Flow Rate:    Intake/output summary:  Intake/Output Summary (Last 24 hours) at 08/24/2020 0820 Last data filed at 08/24/2020 0703 Gross per 24 hour  Intake 1299 ml  Output 450 ml  Net 849 ml   LBM: Last BM Date: 08/21/20 Baseline Weight: Weight: 93.4 kg Most recent weight: Weight: 99.3 kg      PPS 60% Palliative  Assessment/Data:    Flowsheet Rows    Flowsheet Row Most Recent Value  Intake Tab   Referral Department Hospitalist  Unit at Time of Referral Med/Surg Unit  Palliative Care Primary Diagnosis Sepsis/Infectious Disease  Date Notified 08/15/20  Palliative Care Type Return patient Palliative Care  Reason for referral Clarify Goals of Care  Date of Admission 08/08/20  Date first seen by Palliative Care 08/17/20  # of days Palliative referral response time 2 Day(s)  # of days IP prior to Palliative referral 7  Clinical Assessment   Palliative Performance Scale Score 30%  Psychosocial & Spiritual Assessment   Palliative Care Outcomes   Patient/Family meeting held? Yes  Who was at the meeting? Patient, wife       Patient Active Problem List   Diagnosis Date Noted   Infectious diarrhea    ESBL (extended spectrum beta-lactamase) producing bacteria infection    Diarrhea 08/06/2020   PICC (peripherally inserted central catheter) in place 06/11/2020   Medication monitoring encounter 06/11/2020   Renal abscess    Perirectal abscess 05/15/2020   Pyelonephritis of right kidney 05/15/2020   Presence of IVC filter 05/15/2020   Constipation, chronic 05/15/2020   Cysts of right kidney 05/15/2020   Severe aortic stenosis    OSA on CPAP    History of prostate cancer    History of peptic ulcer    History of kidney stones    GERD    CAD (coronary artery disease), native coronary artery    Arthritis    Bulbous urethral stricture 12/03/2019   Internal hemorrhoids 09/11/2018   Noise effect on both inner ears 08/14/2018   Presbycusis of both ears  08/14/2018   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   CKD (chronic kidney disease), stage III (HCC)    Chronic combined systolic and diastolic heart failure ([place]) 11/19/2017   Cardiac pacemaker in situ 03/14/2017   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) 01/24/2016   OSA treated with BiPAP 11/15/2015   Personal history of malignant neoplasm of prostate 04/05/2015   Hypertensive heart disease 04/05/2015   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[place]) 04/05/2015   CAD (coronary artery disease), native coronary artery 04/05/2015   Aortic valve stenosis 04/05/2015   Persistent atrial fibrillation ([place]) 09/19/2014   Moderate persistent asthma without complication 17/00/1749   Atrial fibrillation, persistent ([place]) 09/19/2014   Angina pectoris ([place]) 09/18/2014   Urinary incontinence 03/04/2014   Obesity (BMI 30-39.9)    Long term (current) use of anticoagulants 10/06/2011   Mixed hyperlipidemia 02/24/2010   Gastro-esophageal reflux disease without esophagitis 09/24/2009    Palliative Care Assessment & Plan   Patient Profile:  87yo with a history of CAD, biventricular cardiac pacemaker, status post TAVR, persistent atrial fibrillation on Coumadin, combined systolic and diastolic CHF, HLD, HTN, OSA, DVT/PE status post IVC filter, prostate cancer status post radical prostatectomy, and a recent right renal subcapsular abscess secondary to ESBL (treatment complicated by C. difficile colitis) who presented to the ED with worsening diarrhea and vomiting.     After admission enteropathogenic E. coli was noted in his stool samples and he was treated with azithromycin.  Antimotility agents and cholestyramine were utilized.  Rectal tube was required due to profuse watery diarrhea.  Foley catheter had to be inserted due to incontinence leading to severe perineal skin irritation.  Assessment:  EPEC E coli: ID following, plan for repeat imaging in 2 weeks  regarding renal abscess.  Diarrhea resolved Oral  intake and functional status is improving, no uncontrolled symptoms.   Recommendations/Plan: Continue current mode of care, recommend home with home health care after discharge. No additional PMT specific recommendations at this time. Will request TOC consult as per patient/wife request.      Code Status:    Code Status Orders  (From admission, onward)           Start     Ordered   08/18/20 1803  Do not attempt resuscitation (DNR)  Continuous       Question Answer Comment  In the event of cardiac or respiratory ARREST Do not call a "code blue"   In the event of cardiac or respiratory ARREST Do not perform Intubation, CPR, defibrillation or ACLS   In the event of cardiac or respiratory ARREST Use medication by any route, position, wound care, and other measures to relive pain and suffering. May use oxygen, suction and manual treatment of airway obstruction as needed for comfort.      08/18/20 1802           Code Status History     Date Active Date Inactive Code Status Order ID Comments User Context   08/08/2020 1959 08/18/2020 1802 Full Code [id number]  Orene Desanctis, DO ED   05/24/2020 1117 05/28/2020 1929 DNR [id number]  Loistine Chance, MD Inpatient   05/15/2020 2341 05/24/2020 1117 Full Code [id number]  Orene Desanctis, DO ED   09/12/2019 1151 09/12/2019 1946 Full Code [id number]  Sherren Mocha, MD Inpatient   03/18/2018 1306 03/22/2018 1738 Full Code [id number]  Sherren Mocha, MD Inpatient   02/28/2018 1508 02/28/2018 2010 Full Code [id number]  Sherren Mocha, MD Inpatient   03/14/2017 1700 03/15/2017 1322 Full Code [id number]  Evans Lance, MD Inpatient   09/19/2014 0516 09/19/2014 1505 Full Code [id number]  Alfonso Ramus, MD Inpatient   10/09/2012 2042 10/11/2012 2228 Full Code [id number]  Gearlean Alf, MD Inpatient   09/28/2011 1943 10/04/2011 1600 Full Code [id number]  Jillyn Hidden, RN Inpatient      Advance Directive Documentation     Flowsheet Row Most Recent Value  Type of Advance Directive Healthcare Power of Attorney  Pre-existing out of facility DNR order (yellow form or pink MOST form) Physician notified to receive inpatient order  "MOST" Form in Place? --       Prognosis:  Unable to determine  Discharge Planning: Home with home health Services: [facility], will request TOC assistance.   Care plan was discussed with  patient and wife.   Thank you for allowing the Palliative Medicine Team to assist in the care of this patient.   Time In: 8 Time Out: 8.25 Total Time 25 Prolonged Time Billed  no       Greater than 50%  of this time was spent counseling and coordinating care related to the above assessment and plan.  Loistine Chance, MD  Please contact Palliative Medicine Team phone at [phone] for questions and concerns.

## 2020-08-24 NOTE — Progress Notes (Addendum)
Palliative Care Progress Note  I checked in briefly on Mr. Mochizuki and his wife.  Report concern today regarding him leaking urine around condom cath while walking.  - Wife wondering if he may be able to wear diaper while also keeping on condom cath while ambulating to keep from leaking urine in the hall.  Defer to nursing and PT if this is feasible. - They really liked prior home health team.  Would like to continue with same agency on discharge.  Appreciate TOC assistance.  Micheline Rough, MD [place] Palliative Medicine Team [phone]  NO CHARGE NOTE

## 2020-08-24 NOTE — Progress Notes (Signed)
[name]  TKW:[id number] DOB: Jan 31, 1932 DOA: 08/08/2020 PCP: Shirline Frees, MD    Brief Narrative:  [phone] with a history of CAD, biventricular cardiac pacemaker, status post TAVR, persistent atrial fibrillation on Coumadin, combined systolic and diastolic CHF, HLD, HTN, OSA, DVT/PE status post IVC filter, prostate cancer status post radical prostatectomy, and a recent right renal subcapsular abscess secondary to ESBL (treatment complicated by C. difficile colitis) who presented to the ED with worsening diarrhea and vomiting.  Significant Events:  After admission enteropathogenic E. coli was noted in his stool samples and he was treated with azithromycin.  Antimotility agents and cholestyramine were utilized.  Rectal tube was required due to profuse watery diarrhea.  Foley catheter had to be inserted due to incontinence leading to severe perineal skin irritation.  Consultants:  Palliative Care Infectious Disease  Code Status: NO CODE BLUE   Antimicrobials:  None presently   DVT prophylaxis: Warfarin  Subjective: Mobility improving nicely.  Urinating without his Foley catheter without difficulty.  In great spirits.  Hopeful for discharge tomorrow.  No new complaints.  Assessment & Plan:  Enteropathogenic E. coli with severe and persistent diarrhea C. difficile negative - completed course of azithromycin - continue Lomotil - stop cholestyramine due to possible interaction w/ warfarin - barrier cream to be applied liberally and often - rectal tube discontinued 7/15 with marked decrease frequency of stools - foley discontinued 7/18  Moisture associated skin damage with candidiasis Nystatin powder being utilized - Foley catheter has resulted in significant improvement -with Foley developing leak 7/18 we discontinued it - spoke with patient and wife at length about measures to decrease moisture of the perineum and sacrum  Perinephric abscess ESBL E. Coli Completed 12 weeks of IV  ertapenem 08/08/2020 - now off antibiotics per ID  Acute kidney injury on CKD stage IIIa Creatinine has normalized at this time   Frequent vomiting Resolved  Remote history of PE/DVT Anticoagulated with Coumadin  Catheter associated DVT R UE Despite being supra-therapeutic on warfarin - PICC removed - edema resolved   Persistent atrial fibrillation Continue Coumadin therapy per pharmacy  Bioprosthetic TAVR Continue Coumadin  Hypomagnesemia Corrected with supplementation  Hypokalemia Cont to supplement as needed and follow trend  Acute hospital delirium resolved   Thrombocytopenia Due to above - platelet count stable/improving  HTN Blood pressure reasonably controlled  HLD Hold medical therapy until intake more consistent and significant  OSA  Obesity - Body mass index is 33.29 kg/m.    Family Communication: Spoke with daughter at bedside Status is: Inpatient  Remains inpatient appropriate because:Inpatient level of care appropriate due to severity of illness  Dispo: The patient is from: Home              Anticipated d/c is to:  unclear              Patient currently is not medically stable to d/c.   Difficult to place patient No  Objective: Blood pressure 133/71, pulse (!) 59, temperature 98 F (36.7 C), temperature source Oral, resp. rate 15, height 5\' 8"  (1.727 m), weight 99.3 kg, SpO2 97 %.  Intake/Output Summary (Last 24 hours) at 08/24/2020 0941 Last data filed at 08/24/2020 0703 Gross per 24 hour  Intake 1063 ml  Output 450 ml  Net 613 ml    Filed Weights   08/17/20 0359 08/18/20 0449 08/20/20 0500  Weight: 92.5 kg 97.1 kg 99.3 kg    Examination: General: No acute respiratory distress Lungs: CTA B -  no wheezing Cardiovascular: RRR  Abdomen: NT/ND, soft, BS positive, no rebound Extremities: Trace B LE edeam - trace right upper extremity edema   CBC: Recent Labs  Lab 08/20/20 0350 08/21/20 1146 08/23/20 0439  WBC 9.8 7.3 8.0  HGB  11.8* 12.7* 11.2*  HCT 36.9* 39.8 35.0*  MCV 79.9* 80.4 81.2  PLT 105* 117* 149*    Basic Metabolic Panel: Recent Labs  Lab 08/18/20 0340 08/19/20 0330 08/23/20 0439  NA 142 142 137  K 3.4* 3.4* 3.3*  CL 117* 117* 110  CO2 21* 21* 23  GLUCOSE 101* 93 122*  BUN 42* 35* 19  CREATININE 1.17 1.00 0.90  CALCIUM 9.0 9.0 8.4*  MG  --  1.8  --     GFR: Estimated Creatinine Clearance: 66.1 mL/min (by C-G formula based on SCr of 0.9 mg/dL).  Liver Function Tests: No results for input(s): AST, ALT, ALKPHOS, BILITOT, PROT, ALBUMIN in the last 168 hours. No results for input(s): LIPASE, AMYLASE in the last 168 hours. No results for input(s): AMMONIA in the last 168 hours.  Coagulation Profile: Recent Labs  Lab 08/20/20 0350 08/21/20 1146 08/22/20 0551 08/23/20 0439 08/24/20 0445  INR 3.5* 2.4* 2.4* 2.0* 1.6*     HbA1C: Hgb A1c MFr Bld  Date/Time Value Ref Range Status  03/18/2018 07:46 PM 6.3 (H) 4.8 - 5.6 % Final    Comment:    (NOTE) Pre diabetes:          5.7%-6.4% Diabetes:              >6.4% Glycemic control for   <7.0% adults with diabetes     No results found for this or any previous visit (from the past 240 hour(s)).    Scheduled Meds:  Chlorhexidine Gluconate Cloth  6 each Topical Daily   diphenoxylate-atropine  1 tablet Oral TID   diphenoxylate-atropine  2 tablet Oral Q0600   feeding supplement  1 Container Oral TID BM   isosorbide mononitrate  15 mg Oral QHS   multivitamin with minerals  1 tablet Oral Daily   nystatin   Topical TID   pantoprazole  40 mg Oral QHS   potassium chloride  40 mEq Oral Daily   warfarin  4 mg Oral ONCE-1600   Warfarin - Pharmacist Dosing Inpatient   Does not apply q1600   Zinc Oxide   Topical QID      LOS: 15 days   Cherene Altes, MD [facility] Office  [phone] Pager - Text Page per Shea Evans  If 7PM-7AM, please contact night-coverage per Amion 08/24/2020, 9:41 AM

## 2020-08-25 ENCOUNTER — Telehealth: Payer: Self-pay | Admitting: Cardiology

## 2020-08-25 DIAGNOSIS — A09 Infectious gastroenteritis and colitis, unspecified: Secondary | ICD-10-CM | POA: Diagnosis not present

## 2020-08-25 DIAGNOSIS — A499 Bacterial infection, unspecified: Secondary | ICD-10-CM | POA: Diagnosis not present

## 2020-08-25 DIAGNOSIS — Z952 Presence of prosthetic heart valve: Secondary | ICD-10-CM | POA: Diagnosis not present

## 2020-08-25 DIAGNOSIS — Z1612 Extended spectrum beta lactamase (ESBL) resistance: Secondary | ICD-10-CM | POA: Diagnosis not present

## 2020-08-25 LAB — CBC
HCT: 36.7 % — ABNORMAL LOW (ref 39.0–52.0)
Hemoglobin: 11.5 g/dL — ABNORMAL LOW (ref 13.0–17.0)
MCH: 25.6 pg — ABNORMAL LOW (ref 26.0–34.0)
MCHC: 31.3 g/dL (ref 30.0–36.0)
MCV: 81.6 fL (ref 80.0–100.0)
Platelets: 186 10*3/uL (ref 150–400)
RBC: 4.5 MIL/uL (ref 4.22–5.81)
RDW: 17.9 % — ABNORMAL HIGH (ref 11.5–15.5)
WBC: 5.9 10*3/uL (ref 4.0–10.5)
nRBC: 0 % (ref 0.0–0.2)

## 2020-08-25 LAB — BASIC METABOLIC PANEL
Anion gap: 4 — ABNORMAL LOW (ref 5–15)
BUN: 17 mg/dL (ref 8–23)
CO2: 27 mmol/L (ref 22–32)
Calcium: 9 mg/dL (ref 8.9–10.3)
Chloride: 109 mmol/L (ref 98–111)
Creatinine, Ser: 1.08 mg/dL (ref 0.61–1.24)
GFR, Estimated: >60 mL/min (ref >60)
Glucose, Bld: 99 mg/dL (ref 70–99)
Potassium: 4.9 mmol/L (ref 3.5–5.1)
Sodium: 140 mmol/L (ref 135–145)

## 2020-08-25 LAB — MAGNESIUM: Magnesium: 1.8 mg/dL (ref 1.7–2.4)

## 2020-08-25 LAB — PROTIME-INR
INR: 1.6 — ABNORMAL HIGH (ref 0.8–1.2)
Prothrombin Time: 19.3 seconds — ABNORMAL HIGH (ref 11.4–15.2)

## 2020-08-25 MED ORDER — ACETAMINOPHEN 325 MG PO TABS: 650.0000 mg | ORAL_TABLET | Freq: Four times a day (QID) | ORAL | Status: DC | PRN

## 2020-08-25 MED ORDER — WARFARIN SODIUM 2.5 MG PO TABS
2.5000 mg | ORAL_TABLET | Freq: Once | ORAL | Status: AC
  Administered 2020-08-25: 2.5 mg via ORAL
  Filled 2020-08-25: qty 1

## 2020-08-25 MED ORDER — WARFARIN SODIUM 2.5 MG PO TABS: 2.5000 mg | ORAL_TABLET | Freq: Once | ORAL | Status: DC

## 2020-08-25 MED ORDER — ZINC OXIDE 12.8 % EX OINT: TOPICAL_OINTMENT | Freq: Four times a day (QID) | CUTANEOUS | 1 refills | Status: DC

## 2020-08-25 NOTE — Discharge Summary (Signed)
DISCHARGE SUMMARY  [name]  MR#: [id number]  DOB:04-18-1931  Date of Admission: 08/08/2020 Date of Discharge: 08/25/2020  Attending Physician:Alsha Meland Hennie Duos, MD  Patient's GGE:[id number], [name], MD  Consults: Palliative Care Infectious Disease  Disposition: D/C home   Follow-up Appts:  Follow-up Information     Mignon Pine, DO .   Specialties: Infectious Diseases, Internal Medicine Contact information: [place] [address] [phone]         [facility] [place] Follow up.   Specialty: Home Health Services Why: Your home health agency Contact information: [address] [place] [address] [phone]         Shirline Frees, MD Follow up in 1 week(s).   Specialty: Family Medicine Contact information: [address] [place] [address] [facility] Follow up.   Specialty: Cardiology Why: Maintain your usual scheduled follow up in the coumadin clinic. You will need to have your INR checked in 2 days. Contact information: [address], [place] [phone]                Tests Needing Follow-up: -INR check needed in 2 days -assess skin of sacrum/buttocks/ perineum   Discharge Diagnoses: Enteropathogenic E. coli with severe and persistent diarrhea Moisture associated skin damage with candidiasis POA Perinephric abscess - ESBL E. Coli Acute kidney injury on CKD stage IIIa Frequent vomiting Remote history of PE/DVT Catheter associated DVT R UE Persistent atrial fibrillation Bioprosthetic TAVR Hypomagnesemia Hypokalemia Acute hospital delirium Thrombocytopenia HTN HLD OSA Obesity - Body mass index is 33.29 kg/m.  Initial presentation: 87yo with a history of CAD, biventricular cardiac pacemaker, status post TAVR, persistent atrial fibrillation on Coumadin, combined systolic  and diastolic CHF, HLD, HTN, OSA, DVT/PE status post IVC filter, prostate cancer status post radical prostatectomy, and a recent right renal subcapsular abscess secondary to ESBL (treatment complicated by C. difficile colitis) who presented to the ED with worsening diarrhea and vomiting.  Hospital Course: After admission enteropathogenic E. coli was noted in his stool samples and he was treated with azithromycin.  Antimotility agents and cholestyramine were utilized.  Rectal tube was required due to profuse watery diarrhea.  Foley catheter had to be inserted due to incontinence leading to severe perineal skin irritation. Individual issues addressed during this admission include the following:  Enteropathogenic E. coli with severe and persistent diarrhea C. difficile negative - completed course of azithromycin - continue Lomotil, w/ change from scheduled QID to prn at time of d/c - stopped cholestyramine due to possible interaction w/ warfarin - barrier cream to be applied liberally and often - rectal tube discontinued 7/15 with marked decrease frequency of stools - foley discontinued 7/18   Moisture associated skin damage with candidiasis Nystatin powder was utilized - Foley catheter resulted in significant improvement -with Foley developing leak 7/18 we discontinued it - spoke with patient and wife at length about measures to decrease moisture of the perineum and sacrum - skin stable/improved at time of d/c w/ no active open wounds    Perinephric abscess ESBL E. Coli Completed 12 weeks of IV ertapenem 08/08/2020 - now off antibiotics per ID   Acute kidney injury on CKD stage IIIa Creatinine has normalized at time of d/c    Frequent vomiting Resolved   Remote history of PE/DVT Anticoagulated with Coumadin   Catheter associated DVT R  UE Despite being supra-therapeutic on warfarin - PICC removed - edema resolved    Persistent atrial fibrillation Continue Coumadin therapy per pharmacy    Bioprosthetic TAVR Continue Coumadin   Hypomagnesemia Corrected with supplementation   Hypokalemia Corrected w/ supplementation - felt to be due to GI losses    Acute hospital delirium resolved    Thrombocytopenia Due to above - platelet count stable/improving at time of d/c    HTN Blood pressure reasonably controlled at time of d/c    HLD Resume usual medical therapy upon return home    OSA   Obesity - Body mass index is 33.29 kg/m.    Allergies as of 08/25/2020       Reactions   Darifenacin Nausea Only, Other (See Comments)   dry mouth and dizziness ENABLEX Dizziness Pt denies   Codeine Other (See Comments)   Sulfamethoxazole-trimethoprim Other (See Comments)   Lisinopril Cough   Pt denies        Medication List     STOP taking these medications    amLODipine 2.5 MG tablet Commonly known as: NORVASC   docusate sodium 50 MG capsule Commonly known as: COLACE   furosemide 20 MG tablet Commonly known as: LASIX   Omadacycline Tosylate 150 MG Tabs   vancomycin 125 MG capsule Commonly known as: VANCOCIN       TAKE these medications    acetaminophen 325 MG tablet Commonly known as: TYLENOL Take 2 tablets (650 mg total) by mouth every 6 (six) hours as needed for mild pain, fever or headache. What changed:  medication strength how much to take when to take this reasons to take this   Biotin 2500 MCG Caps Take 2,500 mcg by mouth daily.   cholecalciferol 1000 units tablet Commonly known as: VITAMIN D Take 1,000 Units by mouth daily.   clotrimazole 1 % cream Commonly known as: LOTRIMIN Apply to affected area 2 times daily   diphenoxylate-atropine 2.5-0.025 MG tablet Commonly known as: Lomotil Take 1 tablet by mouth 4 (four) times daily as needed for diarrhea or loose stools.   Fish Oil 1000 MG Caps Take 1,000 mg by mouth daily.   GLUCOSAMINE PO Take 2,000 mg by mouth daily.   isosorbide mononitrate 30 MG 24 hr tablet Commonly  known as: IMDUR Take 0.5 tablets (15 mg total) by mouth daily.   multivitamin capsule Take 1 capsule by mouth daily.   nitroGLYCERIN 0.4 MG SL tablet Commonly known as: NITROSTAT Place 1 tablet (0.4 mg total) under the tongue every 5 (five) minutes as needed.   ondansetron 4 MG disintegrating tablet Commonly known as: Zofran ODT Take 1 tablet (4 mg total) by mouth every 8 (eight) hours as needed for nausea or vomiting.   pantoprazole 40 MG tablet Commonly known as: PROTONIX Take 1 tablet (40 mg total) by mouth daily at 6 (six) AM.   pravastatin 20 MG tablet Commonly known as: PRAVACHOL TAKE ONE TABLET BY MOUTH ONCE DAILY   vitamin B-12 500 MCG tablet Commonly known as: CYANOCOBALAMIN Take 500 mcg by mouth daily.   vitamin C 1000 MG tablet Take 1,000 mg by mouth daily.   warfarin 2.5 MG tablet Commonly known as: COUMADIN Take as directed. If you are unsure how to take this medication, talk to your nurse or doctor. Original instructions: Take 1 tablet daily or as directed by Coumadin Clinic What changed:  how much to take how to take this when to take this additional instructions   Zinc Oxide 12.8 %  ointment Commonly known as: TRIPLE PASTE Apply topically 4 (four) times daily.        Day of Discharge BP 136/78 (BP Location: Left Arm)   Pulse 62   Temp 97.7 F (36.5 C) (Oral)   Resp 18   Ht 5\' 8"  (1.727 m)   Wt 99.3 kg   SpO2 98%   BMI 33.29 kg/m   Physical Exam: General: No acute respiratory distress Lungs: Clear to auscultation bilaterally without wheezes or crackles Cardiovascular: Regular rate and rhythm without murmur gallop or rub normal S1 and S2 Abdomen: Nontender, nondistended, soft, bowel sounds positive, no rebound, no ascites, no appreciable mass Extremities: No significant cyanosis, clubbing, or edema bilateral lower extremities  Basic Metabolic Panel: Recent Labs  Lab 08/19/20 0330 08/23/20 0439 08/25/20 0428  NA 142 137 140  K 3.4*  3.3* 4.9  CL 117* 110 109  CO2 21* 23 27  GLUCOSE 93 122* 99  BUN 35* 19 17  CREATININE 1.00 0.90 1.08  CALCIUM 9.0 8.4* 9.0  MG 1.8  --  1.8    Coags: Recent Labs  Lab 08/21/20 1146 08/22/20 0551 08/23/20 0439 08/24/20 0445 08/25/20 0428  INR 2.4* 2.4* 2.0* 1.6* 1.6*    CBC: Recent Labs  Lab 08/19/20 0330 08/20/20 0350 08/21/20 1146 08/23/20 0439 08/25/20 0428  WBC 10.4 9.8 7.3 8.0 5.9  HGB 12.3* 11.8* 12.7* 11.2* 11.5*  HCT 37.9* 36.9* 39.8 35.0* 36.7*  MCV 80.3 79.9* 80.4 81.2 81.6  PLT 102* 105* 117* 149* 186     Time spent in discharge (includes decision making & examination of pt): 35 minutes  08/25/2020, 10:57 AM   Cherene Altes, MD [facility] Office  [phone]

## 2020-08-25 NOTE — Progress Notes (Addendum)
ANTICOAGULATION CONSULT NOTE - Follow Up Consult  Pharmacy Consult for warfarin Indication: hx atrial fibrillation, pulmonary embolus, and DVT  Allergies  Allergen Reactions   Darifenacin Nausea Only and Other (See Comments)    dry mouth and dizziness ENABLEX Dizziness Pt denies   Codeine Other (See Comments)   Sulfamethoxazole-Trimethoprim Other (See Comments)   Lisinopril Cough    Pt denies    Patient Measurements: Height: 5\' 8"  (172.7 cm) Weight: 99.3 kg (218 lb 14.7 oz) IBW/kg (Calculated) : 68.4 Heparin Dosing Weight:   Vital Signs: Temp: 97.7 F (36.5 C) (07/20 0632) Temp Source: Oral (07/20 4166) BP: 136/78 (07/20 0630) Pulse Rate: 62 (07/20 0632)  Labs: Recent Labs    08/23/20 0439 08/24/20 0445 08/25/20 0428  HGB 11.2*  --  11.5*  HCT 35.0*  --  36.7*  PLT 149*  --  186  LABPROT 22.6* 18.9* 19.3*  INR 2.0* 1.6* 1.6*  CREATININE 0.90  --  1.08     Estimated Creatinine Clearance: 55.1 mL/min (by C-G formula based on SCr of 1.08 mg/dL).   Medications:  PTA warfarin regimen: 2.5mg  daily except 1.25mg  on Wed and Sat  Assessment: Patient is an 87 y.o M with hx DVT/PE (s/p IVC filter), bioprosthetic TAVR, and afib on warfarin PTA, presented to the ED on 7/3 with c/o diarrhea and vomiting.  Warfarin resumed on admission, however was held on 7/10 due to supratherapeutic INR.  Warfarin was restarted on 7/16.  Patient was previously stable on current outpatient regimen.    Today, 08/25/2020: - INR is subtherapeutic at 1.6, stable from yesterday. Will likely see INR increase over next day or two now that warfarin has been restarted and 4mg  given yesterday.   - CBC stable - Drug-drug intxns: N/A- cholestyramine and antibiotics discontinued - On regular diet: eating ~100% of meals  Goal of Therapy:  INR 2-3 Monitor platelets by anticoagulation protocol: Yes   Plan:  - Give warfarin 2.5 mg PO x1 today - Daily INR, CBC - Monitor for s/sx  bleeding  Dimple Nanas, PharmD 08/25/2020 7:19 AM  ADDENDUM: -Patient to be discharged today -He will receive warfarin 2.5mg  PO x1 while admitted, discussed w/ RN- pt will be counseled to not take another warfarin tablet at home today -Discussed w/ MD- continue home warfarin regimen (was stable PTA and initiation of antibiotics per ambulatory anticoag flowsheet) and have INR checked on Friday if possible

## 2020-08-25 NOTE — Telephone Encounter (Signed)
[place] calling in regards to 2 orders sent for June 1 and June 29th. She states she sent the orders to Fax: [phone] and [phone]. She states they both are for coumadin doses and rechecking PT/INR's. Phone: [phone]

## 2020-08-25 NOTE — Telephone Encounter (Signed)
Spoke to [name] just now over at [employer] and she let me know that she is needing two forms signed by Dr. Bettina Gavia for Mr. Reihl. I told her that they are sitting on Dr. Joya Gaskins desk for him to sign and I will fax them back as soon as he does so. She verbalizes understanding and states that this is fine.    Encouraged patient to call back with any questions or concerns.

## 2020-08-26 DIAGNOSIS — I4821 Permanent atrial fibrillation: Secondary | ICD-10-CM | POA: Diagnosis not present

## 2020-08-26 DIAGNOSIS — N119 Chronic tubulo-interstitial nephritis, unspecified: Secondary | ICD-10-CM | POA: Diagnosis not present

## 2020-08-26 DIAGNOSIS — J4540 Moderate persistent asthma, uncomplicated: Secondary | ICD-10-CM | POA: Diagnosis not present

## 2020-08-26 DIAGNOSIS — E782 Mixed hyperlipidemia: Secondary | ICD-10-CM | POA: Diagnosis not present

## 2020-08-26 DIAGNOSIS — D62 Acute posthemorrhagic anemia: Secondary | ICD-10-CM | POA: Diagnosis not present

## 2020-08-26 DIAGNOSIS — K611 Rectal abscess: Secondary | ICD-10-CM | POA: Diagnosis not present

## 2020-08-26 DIAGNOSIS — I459 Conduction disorder, unspecified: Secondary | ICD-10-CM | POA: Diagnosis not present

## 2020-08-26 DIAGNOSIS — M150 Primary generalized (osteo)arthritis: Secondary | ICD-10-CM | POA: Diagnosis not present

## 2020-08-26 DIAGNOSIS — I5042 Chronic combined systolic (congestive) and diastolic (congestive) heart failure: Secondary | ICD-10-CM | POA: Diagnosis not present

## 2020-08-26 DIAGNOSIS — K648 Other hemorrhoids: Secondary | ICD-10-CM | POA: Diagnosis not present

## 2020-08-26 DIAGNOSIS — K5900 Constipation, unspecified: Secondary | ICD-10-CM | POA: Diagnosis not present

## 2020-08-26 DIAGNOSIS — K219 Gastro-esophageal reflux disease without esophagitis: Secondary | ICD-10-CM | POA: Diagnosis not present

## 2020-08-26 DIAGNOSIS — I2510 Atherosclerotic heart disease of native coronary artery without angina pectoris: Secondary | ICD-10-CM | POA: Diagnosis not present

## 2020-08-26 DIAGNOSIS — Z9079 Acquired absence of other genital organ(s): Secondary | ICD-10-CM | POA: Diagnosis not present

## 2020-08-26 DIAGNOSIS — I130 Hypertensive heart and chronic kidney disease with heart failure and stage 1 through stage 4 chronic kidney disease, or unspecified chronic kidney disease: Secondary | ICD-10-CM | POA: Diagnosis not present

## 2020-08-26 DIAGNOSIS — I252 Old myocardial infarction: Secondary | ICD-10-CM | POA: Diagnosis not present

## 2020-08-26 DIAGNOSIS — Z6829 Body mass index (BMI) 29.0-29.9, adult: Secondary | ICD-10-CM | POA: Diagnosis not present

## 2020-08-26 DIAGNOSIS — G4733 Obstructive sleep apnea (adult) (pediatric): Secondary | ICD-10-CM | POA: Diagnosis not present

## 2020-08-26 DIAGNOSIS — E669 Obesity, unspecified: Secondary | ICD-10-CM | POA: Diagnosis not present

## 2020-08-26 DIAGNOSIS — Z8546 Personal history of malignant neoplasm of prostate: Secondary | ICD-10-CM | POA: Diagnosis not present

## 2020-08-26 DIAGNOSIS — N200 Calculus of kidney: Secondary | ICD-10-CM | POA: Diagnosis not present

## 2020-08-26 DIAGNOSIS — N1832 Chronic kidney disease, stage 3b: Secondary | ICD-10-CM | POA: Diagnosis not present

## 2020-08-26 DIAGNOSIS — H9113 Presbycusis, bilateral: Secondary | ICD-10-CM | POA: Diagnosis not present

## 2020-08-26 DIAGNOSIS — R159 Full incontinence of feces: Secondary | ICD-10-CM | POA: Diagnosis not present

## 2020-08-26 DIAGNOSIS — R32 Unspecified urinary incontinence: Secondary | ICD-10-CM | POA: Diagnosis not present

## 2020-08-27 ENCOUNTER — Ambulatory Visit (INDEPENDENT_AMBULATORY_CARE_PROVIDER_SITE_OTHER): Payer: Medicare Other | Admitting: *Deleted

## 2020-08-27 DIAGNOSIS — I4819 Other persistent atrial fibrillation: Secondary | ICD-10-CM | POA: Diagnosis not present

## 2020-08-27 DIAGNOSIS — N119 Chronic tubulo-interstitial nephritis, unspecified: Secondary | ICD-10-CM | POA: Diagnosis not present

## 2020-08-27 DIAGNOSIS — Z5181 Encounter for therapeutic drug level monitoring: Secondary | ICD-10-CM | POA: Diagnosis not present

## 2020-08-27 DIAGNOSIS — N200 Calculus of kidney: Secondary | ICD-10-CM | POA: Diagnosis not present

## 2020-08-27 DIAGNOSIS — I130 Hypertensive heart and chronic kidney disease with heart failure and stage 1 through stage 4 chronic kidney disease, or unspecified chronic kidney disease: Secondary | ICD-10-CM | POA: Diagnosis not present

## 2020-08-27 DIAGNOSIS — K611 Rectal abscess: Secondary | ICD-10-CM | POA: Diagnosis not present

## 2020-08-27 DIAGNOSIS — I4821 Permanent atrial fibrillation: Secondary | ICD-10-CM | POA: Diagnosis not present

## 2020-08-27 DIAGNOSIS — I2510 Atherosclerotic heart disease of native coronary artery without angina pectoris: Secondary | ICD-10-CM | POA: Diagnosis not present

## 2020-08-27 DIAGNOSIS — Z86718 Personal history of other venous thrombosis and embolism: Secondary | ICD-10-CM | POA: Diagnosis not present

## 2020-08-27 LAB — POCT INR: INR: 1.5 — AB (ref 2.0–3.0)

## 2020-08-27 NOTE — Patient Instructions (Signed)
Description   Spoke with Lujean Rave, RN with [facility] and advised to have pt take 1.5 tablets and 1 tablet tomorrow then resume taking warfarin 1 tablet daily except for 1/2 tablet on Wednesdays and Saturdays. Recheck INR on Wednesday (09/01/20) by Home Health-[facility] WILL CHECK INR. [facility] [phone].

## 2020-08-28 ENCOUNTER — Emergency Department (HOSPITAL_COMMUNITY): Payer: Medicare Other

## 2020-08-28 ENCOUNTER — Other Ambulatory Visit: Payer: Self-pay | Admitting: Medical

## 2020-08-28 ENCOUNTER — Telehealth: Payer: Self-pay | Admitting: Medical

## 2020-08-28 ENCOUNTER — Observation Stay (HOSPITAL_COMMUNITY)
Admission: EM | Admit: 2020-08-28 | Discharge: 2020-08-29 | Disposition: A | Discharge status: Home / Self Care | Payer: Medicare Other | Attending: Internal Medicine | Admitting: Internal Medicine

## 2020-08-28 DIAGNOSIS — I130 Hypertensive heart and chronic kidney disease with heart failure and stage 1 through stage 4 chronic kidney disease, or unspecified chronic kidney disease: Secondary | ICD-10-CM | POA: Insufficient documentation

## 2020-08-28 DIAGNOSIS — R0902 Hypoxemia: Secondary | ICD-10-CM | POA: Diagnosis not present

## 2020-08-28 DIAGNOSIS — R4182 Altered mental status, unspecified: Secondary | ICD-10-CM | POA: Diagnosis not present

## 2020-08-28 DIAGNOSIS — I4819 Other persistent atrial fibrillation: Secondary | ICD-10-CM | POA: Diagnosis not present

## 2020-08-28 DIAGNOSIS — R410 Disorientation, unspecified: Secondary | ICD-10-CM

## 2020-08-28 DIAGNOSIS — I5042 Chronic combined systolic (congestive) and diastolic (congestive) heart failure: Secondary | ICD-10-CM | POA: Diagnosis present

## 2020-08-28 DIAGNOSIS — Z87448 Personal history of other diseases of urinary system: Secondary | ICD-10-CM | POA: Diagnosis not present

## 2020-08-28 DIAGNOSIS — Z96653 Presence of artificial knee joint, bilateral: Secondary | ICD-10-CM | POA: Diagnosis not present

## 2020-08-28 DIAGNOSIS — G9340 Encephalopathy, unspecified: Principal | ICD-10-CM | POA: Diagnosis present

## 2020-08-28 DIAGNOSIS — Z87891 Personal history of nicotine dependence: Secondary | ICD-10-CM | POA: Diagnosis not present

## 2020-08-28 DIAGNOSIS — Z20822 Contact with and (suspected) exposure to covid-19: Secondary | ICD-10-CM | POA: Diagnosis not present

## 2020-08-28 DIAGNOSIS — R404 Transient alteration of awareness: Secondary | ICD-10-CM | POA: Diagnosis not present

## 2020-08-28 DIAGNOSIS — N183 Chronic kidney disease, stage 3 unspecified: Secondary | ICD-10-CM | POA: Insufficient documentation

## 2020-08-28 DIAGNOSIS — I5032 Chronic diastolic (congestive) heart failure: Secondary | ICD-10-CM

## 2020-08-28 DIAGNOSIS — I10 Essential (primary) hypertension: Secondary | ICD-10-CM | POA: Diagnosis not present

## 2020-08-28 DIAGNOSIS — J45909 Unspecified asthma, uncomplicated: Secondary | ICD-10-CM | POA: Insufficient documentation

## 2020-08-28 DIAGNOSIS — Z950 Presence of cardiac pacemaker: Secondary | ICD-10-CM | POA: Insufficient documentation

## 2020-08-28 DIAGNOSIS — Z7901 Long term (current) use of anticoagulants: Secondary | ICD-10-CM | POA: Diagnosis not present

## 2020-08-28 DIAGNOSIS — Z952 Presence of prosthetic heart valve: Secondary | ICD-10-CM | POA: Diagnosis not present

## 2020-08-28 DIAGNOSIS — Z79899 Other long term (current) drug therapy: Secondary | ICD-10-CM | POA: Diagnosis not present

## 2020-08-28 DIAGNOSIS — I2510 Atherosclerotic heart disease of native coronary artery without angina pectoris: Secondary | ICD-10-CM | POA: Insufficient documentation

## 2020-08-28 DIAGNOSIS — R29810 Facial weakness: Secondary | ICD-10-CM | POA: Diagnosis not present

## 2020-08-28 DIAGNOSIS — Z95810 Presence of automatic (implantable) cardiac defibrillator: Secondary | ICD-10-CM | POA: Diagnosis not present

## 2020-08-28 DIAGNOSIS — Z8546 Personal history of malignant neoplasm of prostate: Secondary | ICD-10-CM | POA: Diagnosis not present

## 2020-08-28 DIAGNOSIS — G4733 Obstructive sleep apnea (adult) (pediatric): Secondary | ICD-10-CM | POA: Diagnosis present

## 2020-08-28 DIAGNOSIS — A419 Sepsis, unspecified organism: Secondary | ICD-10-CM | POA: Diagnosis not present

## 2020-08-28 LAB — CBC WITH DIFFERENTIAL/PLATELET
Abs Immature Granulocytes: 0.02 10*3/uL (ref 0.00–0.07)
Basophils Absolute: 0 10*3/uL (ref 0.0–0.1)
Basophils Relative: 0 %
Eosinophils Absolute: 0.5 10*3/uL (ref 0.0–0.5)
Eosinophils Relative: 7 %
HCT: 41.4 % (ref 39.0–52.0)
Hemoglobin: 13.1 g/dL (ref 13.0–17.0)
Immature Granulocytes: 0 %
Lymphocytes Relative: 17 %
Lymphs Abs: 1.1 10*3/uL (ref 0.7–4.0)
MCH: 25.9 pg — ABNORMAL LOW (ref 26.0–34.0)
MCHC: 31.6 g/dL (ref 30.0–36.0)
MCV: 81.8 fL (ref 80.0–100.0)
Monocytes Absolute: 0.5 10*3/uL (ref 0.1–1.0)
Monocytes Relative: 8 %
Neutro Abs: 4.6 10*3/uL (ref 1.7–7.7)
Neutrophils Relative %: 68 %
Platelets: 290 10*3/uL (ref 150–400)
RBC: 5.06 MIL/uL (ref 4.22–5.81)
RDW: 18.2 % — ABNORMAL HIGH (ref 11.5–15.5)
WBC: 6.8 10*3/uL (ref 4.0–10.5)
nRBC: 0 % (ref 0.0–0.2)

## 2020-08-28 LAB — APTT: aPTT: 37 seconds — ABNORMAL HIGH (ref 24–36)

## 2020-08-28 LAB — COMPREHENSIVE METABOLIC PANEL
ALT: 14 U/L (ref 0–44)
AST: 37 U/L (ref 15–41)
Albumin: 2.7 g/dL — ABNORMAL LOW (ref 3.5–5.0)
Alkaline Phosphatase: 177 U/L — ABNORMAL HIGH (ref 38–126)
Anion gap: 9 (ref 5–15)
BUN: 10 mg/dL (ref 8–23)
CO2: 26 mmol/L (ref 22–32)
Calcium: 9.2 mg/dL (ref 8.9–10.3)
Chloride: 103 mmol/L (ref 98–111)
Creatinine, Ser: 1.41 mg/dL — ABNORMAL HIGH (ref 0.61–1.24)
GFR, Estimated: 48 mL/min — ABNORMAL LOW (ref >60)
Glucose, Bld: 124 mg/dL — ABNORMAL HIGH (ref 70–99)
Potassium: 4.2 mmol/L (ref 3.5–5.1)
Sodium: 138 mmol/L (ref 135–145)
Total Bilirubin: 1.1 mg/dL (ref 0.3–1.2)
Total Protein: 6.4 g/dL — ABNORMAL LOW (ref 6.5–8.1)

## 2020-08-28 LAB — PROTIME-INR
INR: 1.8 — ABNORMAL HIGH (ref 0.8–1.2)
Prothrombin Time: 21.2 seconds — ABNORMAL HIGH (ref 11.4–15.2)

## 2020-08-28 LAB — LACTIC ACID, PLASMA
Lactic Acid, Venous: 2.2 mmol/L (ref 0.5–1.9)
Lactic Acid, Venous: 1.5 mmol/L (ref 0.5–1.9)

## 2020-08-28 MED ORDER — HALOPERIDOL LACTATE 5 MG/ML IJ SOLN
2.0000 mg | Freq: Once | INTRAMUSCULAR | Status: AC
  Administered 2020-08-28: 2 mg via INTRAMUSCULAR
  Filled 2020-08-28: qty 1

## 2020-08-28 MED ORDER — SODIUM CHLORIDE 0.9 % IV BOLUS
500.0000 mL | Freq: Once | INTRAVENOUS | Status: AC
  Administered 2020-08-28: 500 mL via INTRAVENOUS

## 2020-08-28 MED ORDER — LORAZEPAM 2 MG/ML IJ SOLN
1.0000 mg | Freq: Once | INTRAMUSCULAR | Status: AC
  Administered 2020-08-28: 1 mg via INTRAMUSCULAR
  Filled 2020-08-28: qty 1

## 2020-08-28 MED ORDER — FUROSEMIDE 20 MG PO TABS: 20.0000 mg | ORAL_TABLET | Freq: Every day | ORAL | 1 refills | Status: DC

## 2020-08-28 NOTE — ED Provider Notes (Signed)
[facility] Note   CSN: [id number] Arrival date & time: 08/28/20  1927     History Chief Complaint  Patient presents with   Altered Mental Status    EMS arrival. Discharged from [place]. Kidney infection. Wife called EMS from home. 1800 became altered. Given 2.5 Haldol and 2.5 Versed enroute. Combative on scene.    [name] is a 87 y.o. male.  The history is provided by the patient, the EMS personnel, the spouse and medical records.  Altered Mental Status [name] is a 87 y.o. male who presents to the Emergency Department complaining of altered mental status. He presents the emergency department by EMS for evaluation of altered mental status that started abruptly this evening between 530 and six. He was recently discharged from [facility] following a two-week hospitalization. He was doing well following hospital discharge when this evening he became abruptly altered. Wife states that he was confused and speaking out of his head and then later develop difficulty with speech and see Ronalee Belts he cannot get the appropriate words out. No reports of fevers, nausea, vomiting, abdominal pain.    Past Medical History:  Diagnosis Date   Aortic valve stenosis 04/05/2015   Formatting of this note might be different from the original. Overview:  ECHO 5/15 Formatting of this note might be different from the original. Overview:  Overview:  ECHO 5/15   Arthritis    "knees, right shoulder" (03/14/2017)   Atrial fibrillation, persistent ([place]) 09/19/2014   CAD (coronary artery disease), native coronary artery    Cath 2011 LHC (08/2009):~ Proximal LAD 30%, mid to distal LAD 25%, ostial small D1 75% mid AV groove circumflex 99% been subtotal stenosis, proximal to mid RCA 25-30%, mid RCA 30%, mid PDA 30%, EF 50% with inferior hypokinesis.;    July 2011  PCI and DES to circumflex Dr. Olevia Perches   ETT-Myoview (06/2013):  Inferolateral scar, mild  peri-infarct ischemia, EF 40%; Intermediate Risk   ECHO EF 55% 03/2013    CAD (coronary artery disease), native coronary artery 04/05/2015   Cath 2011 LHC (08/2009):~ Proximal LAD 30%, mid to distal LAD 25%, ostial small D1 75% mid AV groove circumflex 99% been subtotal stenosis, proximal to mid RCA 25-30%, mid RCA 30%, mid PDA 30%, EF 50% with inferior hypokinesis.;    July 2011  PCI and DES to circumflex Dr. Olevia Perches   ETT-Myoview (06/2013):  Inferolateral scar, mild peri-infarct ischemia, EF 40%; Intermediate Risk   ECHO EF 55% 03/2013     Cardiac pacemaker in situ 03/14/2017   Biventricular [employer] Jude inserted 03/14/17 Dr. 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   History of prostate cancer    Radical prostatectomy in 2006 Dr. Terance Hart    Hypertensive heart disease    Internal hemorrhoids 09/11/2018   Long term (current) use of anticoagulants 10/06/2011   Mixed hyperlipidemia    Moderate persistent asthma without complication [id number]   Noise effect on both inner ears 08/14/2018   Obesity (BMI 30-39.9)    OSA on CPAP  OSA treated with BiPAP 11/15/2015   Persistent atrial fibrillation ([place]) 09/19/2014   CHA2DS2VASC score 5  Cardioversion 10/08/2014 was on amiodarone until 2018    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)    Initial DVT in 2006 after prostate surgery and had [place] filter placed Bilateral  PE 2013 and placed back on warfarin DVT of right subclavian vein on doppler 10/2012 at time of knee infection    Personal history of malignant neoplasm of prostate    Radical  prostatectomy in 2006 Dr. Terance Hart     Presbycusis of both ears 08/14/2018   Severe aortic stenosis    Severe aortic stenosis 03/18/2018   Urinary incontinence    MULTIPLE BLADDER SURGERIES - STATES NO URINARY SPHINCTER - PT'S UROLOGIST IS AT [facility]- DR. PETERSON  ( LAST VISIT WAS 09/15/11 )    Patient Active Problem List   Diagnosis Date Noted   PICC (peripherally inserted central catheter) in place 06/11/2020   Medication monitoring encounter 06/11/2020   Renal abscess    Perirectal abscess 05/15/2020   Pyelonephritis of right kidney 05/15/2020   Presence of IVC filter 05/15/2020   Constipation, chronic 05/15/2020   Cysts of right kidney 05/15/2020   Severe aortic stenosis    OSA on CPAP    History of prostate cancer    History of peptic ulcer    History of kidney stones    GERD    CAD (coronary artery disease), native coronary artery    Arthritis    Bulbous urethral stricture 12/03/2019   Internal hemorrhoids 09/11/2018   Noise effect on both inner ears 08/14/2018   Presbycusis of both ears 08/14/2018   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   CKD (chronic kidney disease), stage III ([facility])    Chronic combined systolic and diastolic heart failure ([place]) 11/19/2017   Cardiac pacemaker in situ 03/14/2017   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) 01/24/2016   OSA treated with BiPAP 11/15/2015   Personal history of malignant neoplasm of prostate 04/05/2015   Hypertensive heart disease 04/05/2015   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[place]) 04/05/2015   CAD (coronary artery disease), native coronary artery 04/05/2015   Aortic valve stenosis 04/05/2015   Persistent atrial fibrillation ([place]) 09/19/2014   Moderate persistent asthma without complication [id number]   Atrial fibrillation, persistent ([place]) 09/19/2014   Angina pectoris ([place]) 09/18/2014   Urinary incontinence 03/04/2014   Obesity (BMI 30-39.9)    Long term (current) use of  anticoagulants 10/06/2011   Mixed hyperlipidemia 02/24/2010   Gastro-esophageal reflux disease without esophagitis 09/24/2009    Past Surgical History:  Procedure Laterality Date   APPENDECTOMY     BI-VENTRICULAR PACEMAKER INSERTION (CRT-P)  03/14/2017   BIV PACEMAKER INSERTION CRT-P N/A 03/14/2017   Procedure: BIV PACEMAKER INSERTION CRT-P;  Surgeon: Evans Lance, MD;  Location: [facility];  Service: Cardiovascular;  Laterality: N/A;   CARDIOVERSION N/A 10/08/2014   Procedure: CARDIOVERSION;  Surgeon: Jacolyn Reedy, MD;  Location: [facility];  Service: Cardiovascular;  Laterality: N/A;   CATARACT EXTRACTION W/ INTRAOCULAR LENS  IMPLANT, BILATERAL Bilateral    CORONARY ANGIOPLASTY WITH STENT PLACEMENT  08/25/2009   DES-mid LCx 08/2009; 30% pLAD, 25% m/dLAD, 75% ostial D1, 99% mLCx s/p DES, 25-30% p/mRCA, 40% mRCA, 30% mPDA stenoses; LVEF 50%, inf hypokinesis   CORONARY STENT INTERVENTION N/A 03/18/2018   Procedure: CORONARY STENT INTERVENTION;  Surgeon: Sherren Mocha, MD;  Location: [facility];  Service: Cardiovascular;  Laterality: N/A;   EXCISIONAL HEMORRHOIDECTOMY     I & D KNEE WITH POLY EXCHANGE Left 10/09/2012   Procedure: IRRIGATION AND DEBRIDEMENT LEFT KNEE WITH POLY REVISION;  Surgeon: Gearlean Alf, MD;  Location: [facility];  Service: Orthopedics;  Laterality: Left;   INCISION AND DRAINAGE ABSCESS N/A 05/16/2020   Procedure: INCISION AND DRAINAGE  PERIRECTAL ABSCESS;  Surgeon: Rolm Bookbinder, MD;  Location: [facility];  Service: General;  Laterality: N/A;   INGUINAL HERNIA REPAIR Right    INTRAOPERATIVE TRANSTHORACIC ECHOCARDIOGRAM  03/19/2018   Procedure: Intraoperative Transthoracic Echocardiogram;  Surgeon: Sherren Mocha, MD;  Location: [facility];  Service: Open Heart Surgery;;   IR FLUORO GUIDE CV LINE RIGHT  06/14/2020   IR RADIOLOGIST EVAL & MGMT  06/10/2020   JOINT REPLACEMENT     KNEE ARTHROTOMY Left 10/09/2012   Procedure: LEFT KNEE ARTHROTOMY;  Surgeon: Gearlean Alf, MD;  [facility];  Service: Orthopedics;  Laterality: Left;   LEFT HEART CATH AND CORONARY ANGIOGRAPHY N/A 02/28/2018   Procedure: LEFT HEART CATH AND CORONARY ANGIOGRAPHY;  Surgeon: Sherren Mocha, MD;  Location: [facility];  Service: Cardiovascular;  Laterality: N/A;   LEFT HEART CATH AND CORONARY ANGIOGRAPHY N/A 09/12/2019   Procedure: LEFT HEART CATH AND CORONARY ANGIOGRAPHY;  Surgeon: Sherren Mocha, MD;  Location: [facility];  Service: Cardiovascular;  Laterality: N/A;   PROSTATECTOMY  04/25/2004   REPLACEMENT TOTAL KNEE Bilateral    SKIN BIOPSY     "off nose; wasn't cancer; it was tested" (03/14/2017)   TRANSCATHETER AORTIC VALVE REPLACEMENT, TRANSFEMORAL N/A 03/19/2018   Procedure: TRANSCATHETER AORTIC VALVE REPLACEMENT, TRANSFEMORAL;  Surgeon: Sherren Mocha, MD;  Location: [facility];  Service: Open Heart Surgery;  Laterality: N/A;   Uretheral implants     multiple for incontinence   VENA CAVA FILTER PLACEMENT         Family History  Problem Relation Age of Onset   Melanoma Father    Melanoma Brother     Social History   Tobacco Use   Smoking status: Former    Packs/day: 1.00    Years: 10.00    Pack years: 10.00    Types: Cigarettes    Quit date: 04/27/1961    Years since quitting: 59.3   Smokeless tobacco: Never  Vaping Use   Vaping Use: Never used  Substance Use Topics   Alcohol use: No    Alcohol/week: 0.0 standard drinks   Drug use: No    Home Medications Prior to Admission medications   Medication Sig Start Date End Date Taking? Authorizing Provider  acetaminophen (TYLENOL) 325 MG tablet Take 2 tablets (650 mg total) by mouth every 6 (six) hours as needed for mild pain, fever or headache. 08/25/20   Cherene Altes, MD  Ascorbic Acid (VITAMIN C) 1000 MG tablet Take 1,000 mg by mouth daily.    [provider]  Biotin 2500 MCG CAPS Take 2,500 mcg by mouth daily.    [provider]  cholecalciferol (VITAMIN D) 1000  UNITS tablet Take 1,000 Units by mouth daily.    [provider]  clotrimazole (LOTRIMIN) 1 % cream Apply to affected area 2 times daily 08/08/20   Sherwood Gambler, MD  diphenoxylate-atropine (LOMOTIL) 2.5-0.025 MG tablet Take 1 tablet by mouth 4 (four) times daily as needed for diarrhea or loose stools. 08/08/20   Sherwood Gambler, MD  furosemide (LASIX) 20 MG tablet Take 1 tablet (20 mg  total) by mouth daily. 08/28/20 11/26/20  Furth, Cadence H, PA-C  Glucosamine HCl (GLUCOSAMINE PO) Take 2,000 mg by mouth daily.    [provider]  isosorbide mononitrate (IMDUR) 30 MG 24 hr tablet Take 0.5 tablets (15 mg total) by mouth daily. 02/05/20   Richardo Priest, MD  Multiple Vitamin (MULTIVITAMIN) capsule Take 1 capsule by mouth daily.  04/28/19   [provider]  nitroGLYCERIN (NITROSTAT) 0.4 MG SL tablet Place 1 tablet (0.4 mg total) under the tongue every 5 (five) minutes as needed. 07/14/19   Richardo Priest, MD  Omega-3 Fatty Acids (FISH OIL) 1000 MG CAPS Take 1,000 mg by mouth daily.    [provider]  ondansetron (ZOFRAN ODT) 4 MG disintegrating tablet Take 1 tablet (4 mg total) by mouth every 8 (eight) hours as needed for nausea or vomiting. 08/08/20   Sherwood Gambler, MD  pantoprazole (PROTONIX) 40 MG tablet Take 1 tablet (40 mg total) by mouth daily at 6 (six) AM. 05/29/20   Sheikh, Georgina Quint Latif, DO  pravastatin (PRAVACHOL) 20 MG tablet TAKE ONE TABLET BY MOUTH ONCE DAILY 01/08/20   Richardo Priest, MD  vitamin B-12 (CYANOCOBALAMIN) 500 MCG tablet Take 500 mcg by mouth daily.    [provider]  warfarin (COUMADIN) 2.5 MG tablet Take 1 tablet daily or as directed by [facility] Clinic 04/16/20   Richardo Priest, MD  Zinc Oxide (TRIPLE PASTE) 12.8 % ointment Apply topically 4 (four) times daily. 08/25/20   Cherene Altes, MD    Allergies    Darifenacin, Codeine, Sulfamethoxazole-trimethoprim, and Lisinopril  Review of Systems   Review of Systems  All other  systems reviewed and are negative.  Physical Exam Updated Vital Signs BP (!) 166/75   Pulse (!) 107   Resp (!) 22   SpO2 93%   Physical Exam Vitals and nursing note reviewed.  Constitutional:      Appearance: He is well-developed.  HENT:     Head: Normocephalic and atraumatic.  Cardiovascular:     Rate and Rhythm: Normal rate and regular rhythm.     Heart sounds: No murmur heard. Pulmonary:     Effort: Pulmonary effort is normal. No respiratory distress.     Breath sounds: Normal breath sounds.  Abdominal:     Palpations: Abdomen is soft.     Tenderness: There is no abdominal tenderness. There is no guarding or rebound.  Musculoskeletal:        General: No tenderness.  Skin:    General: Skin is warm and dry.  Neurological:     Mental Status: He is alert.     Comments: Five out of five strength in all four extremities. Oriented to person. Disoriented to place in time. Repeatedly asked for water and his wife.  Psychiatric:     Comments: Mildly agitated    ED Results / Procedures / Treatments   Labs (all labs ordered are listed, but only abnormal results are displayed) Labs Reviewed  CULTURE, BLOOD (SINGLE)  URINE CULTURE  LACTIC ACID, PLASMA  LACTIC ACID, PLASMA  COMPREHENSIVE METABOLIC PANEL  CBC WITH DIFFERENTIAL/PLATELET  PROTIME-INR  APTT  URINALYSIS, ROUTINE W REFLEX MICROSCOPIC    EKG EKG Interpretation  Date/Time:  Saturday August 28 2020 19:38:37 EDT Ventricular Rate:  66 PR Interval:  202 QRS Duration: 157 QT Interval:  479 QTC Calculation: 502 R Axis:   -81 Text Interpretation: VENTRICULAR PACED RHYTHM Confirmed by Quintella Reichert [phone]) on 08/28/2020 9:08:16 PM  Radiology  DG Chest Port 1 View  Result Date: 08/28/2020 CLINICAL DATA:  Questionable sepsis. Recovering from kidney infection. EXAM: PORTABLE CHEST 1 VIEW COMPARISON:  08/17/2020. FINDINGS: Cardiac silhouette normal in size. Stable changes from a previous aortic valve replacement. Left  anterior chest wall biventricular cardioverter-defibrillator is stable. No mediastinal or hilar masses. Interstitial thickening noted in the lung bases, unchanged. Lungs otherwise clear. No convincing pleural effusion.  No pneumothorax. Skeletal structures are grossly intact. IMPRESSION: No acute cardiopulmonary disease. Electronically Signed   By: Lajean Manes M.D.   On: 08/28/2020 20:34    Procedures Procedures   Medications Ordered in ED Medications  haloperidol lactate (HALDOL) injection 2 mg (2 mg Intramuscular Given 08/28/20 1949)  LORazepam (ATIVAN) injection 1 mg (1 mg Intramuscular Given 08/28/20 1950)    ED Course  I have reviewed the triage vital signs and the nursing notes.  Pertinent labs & imaging results that were available during my care of the patient were reviewed by me and considered in my medical decision making (see chart for details).    MDM Rules/Calculators/A&P                          patient with recent hospitalization for renal abscess and enteropathogenic E. Coli as well as DVT on warfarin here for evaluation of acute altered mental status. Patient is confused on evaluation, appears dehydrated. Aside from confusion he has no focal neurologic deficits. He did require sedation for his agitation. He does have mild elevation is lactate but no other evidence of infection. On repeat assessment his mental status does appear to be improved. He is able now to give a clear sentence. He is disoriented to year but oriented to place. Given acute change in his status plan to admit for ongoing workup. Medicine consulted for admission. Neurology consulted per hospitalist request.  Final Clinical Impression(s) / ED Diagnoses Final diagnoses:  None    Rx / DC Orders ED Discharge Orders     None        Quintella Reichert, MD 08/29/20 0007

## 2020-08-28 NOTE — Telephone Encounter (Signed)
Patient reports he was just discharged from the hospital for infection 7/20. While in the hospital lasix was discontinued and he was given IVF. He reports worsening lower leg edema. No other symptoms. He says he has been on lasix '20mg'$  QD for years. I recommended he re-start lasix 20 mg daily and be seen in the office to assess volume status. Will send in rx and let Dr. Bettina Gavia know.   H/o TAVR and chronic combined systolic and diastolic heart failure.

## 2020-08-28 NOTE — ED Notes (Signed)
Dr. Ralene Bathe notified of 2.2 Lactic.

## 2020-08-29 ENCOUNTER — Encounter (HOSPITAL_COMMUNITY): Payer: Self-pay | Admitting: Internal Medicine

## 2020-08-29 ENCOUNTER — Observation Stay (HOSPITAL_COMMUNITY): Payer: Medicare Other

## 2020-08-29 DIAGNOSIS — G9340 Encephalopathy, unspecified: Secondary | ICD-10-CM | POA: Diagnosis not present

## 2020-08-29 DIAGNOSIS — I5042 Chronic combined systolic (congestive) and diastolic (congestive) heart failure: Secondary | ICD-10-CM

## 2020-08-29 DIAGNOSIS — I4819 Other persistent atrial fibrillation: Secondary | ICD-10-CM | POA: Diagnosis not present

## 2020-08-29 DIAGNOSIS — Z7901 Long term (current) use of anticoagulants: Secondary | ICD-10-CM | POA: Diagnosis not present

## 2020-08-29 DIAGNOSIS — R471 Dysarthria and anarthria: Secondary | ICD-10-CM | POA: Diagnosis not present

## 2020-08-29 DIAGNOSIS — G4733 Obstructive sleep apnea (adult) (pediatric): Secondary | ICD-10-CM | POA: Diagnosis not present

## 2020-08-29 LAB — SARS CORONAVIRUS 2 (TAT 6-24 HRS): SARS Coronavirus 2: NEGATIVE

## 2020-08-29 MED ORDER — ACETAMINOPHEN 650 MG RE SUPP: 650.0000 mg | Freq: Four times a day (QID) | RECTAL | Status: DC | PRN

## 2020-08-29 MED ORDER — WARFARIN SODIUM 2.5 MG PO TABS: 2.5000 mg | ORAL_TABLET | ORAL | Status: DC

## 2020-08-29 MED ORDER — CYANOCOBALAMIN 500 MCG PO TABS
500.0000 ug | ORAL_TABLET | Freq: Every day | ORAL | Status: DC
  Filled 2020-08-29: qty 1

## 2020-08-29 MED ORDER — WARFARIN SODIUM 2.5 MG PO TABS
2.5000 mg | ORAL_TABLET | Freq: Once | ORAL | Status: DC
  Filled 2020-08-29: qty 1

## 2020-08-29 MED ORDER — ISOSORBIDE MONONITRATE ER 30 MG PO TB24: 15.0000 mg | ORAL_TABLET | Freq: Every day | ORAL | Status: DC

## 2020-08-29 MED ORDER — PRAVASTATIN SODIUM 10 MG PO TABS: 20.0000 mg | ORAL_TABLET | Freq: Every day | ORAL | Status: DC

## 2020-08-29 MED ORDER — PANTOPRAZOLE SODIUM 40 MG PO TBEC
40.0000 mg | DELAYED_RELEASE_TABLET | Freq: Every day | ORAL | Status: DC
  Administered 2020-08-29: 40 mg via ORAL
  Filled 2020-08-29: qty 1

## 2020-08-29 MED ORDER — DIPHENOXYLATE-ATROPINE 2.5-0.025 MG PO TABS: 1.0000 | ORAL_TABLET | Freq: Four times a day (QID) | ORAL | Status: DC | PRN

## 2020-08-29 MED ORDER — FUROSEMIDE 20 MG PO TABS: 20.0000 mg | ORAL_TABLET | Freq: Every day | ORAL | 1 refills | Status: AC | PRN

## 2020-08-29 MED ORDER — WARFARIN - PHARMACIST DOSING INPATIENT: Freq: Every day | Status: DC

## 2020-08-29 MED ORDER — ACETAMINOPHEN 325 MG PO TABS: 650.0000 mg | ORAL_TABLET | Freq: Four times a day (QID) | ORAL | Status: DC | PRN

## 2020-08-29 NOTE — Discharge Summary (Signed)
Physician Discharge Summary  [name] [id number] DOB: 11-May-1931 DOA: 08/28/2020  PCP: Shirline Frees, MD  Admit date: 08/28/2020 Discharge date: 08/29/2020  Admitted From: Home Disposition: Home  Recommendations for Outpatient Follow-up:  Follow up with PCP in 1-2 weeks Please obtain BMP/CBC in one week Patient is being followed by palliative care as an outpatient   Discharge Condition: Stable CODE STATUS: DNR Diet recommendation: Heart healthy  Brief/Interim Summary: This is an 87 year old male with a history of combined CHF, history of TAVR, prior pacemaker placement in 2019, persistent atrial fibrillation on Coumadin, 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 coli diarrhea who was discharged approximately 4 days ago after being treated with antibiotics.  More recently, in the past few months he was hospitalized for ESBL perinephric abscess and completed a prolonged course of IV antibiotics.  He had an abdominal drain in place that is currently removed.  He has been following with infectious disease.  Patient was reportedly in his usual state of health until the evening of admission when he suddenly became confused.  His daughter describes that he was mumbling and it was difficult to comprehend his speech.  He was not having any trouble ambulating.  He did not have any unilateral weakness or numbness.  He was becoming increasingly agitated when EMS was called.  She reports that EMS gave him possibly Ativan and he was brought to the emergency room for evaluation.  By the time he arrived to the emergency room, his mental status had began to improve and returned to baseline.  He had not had any recent cough, fever, diarrhea, vomiting.  P.o. intake has been fair.  In the emergency room, he was noted to have mildly elevated lactic acid that improved after IV hydration.  Also noted to be elevated to 1.5 compared to normal value during his prior admission a  few days ago.  WBC count noted to be normal.  He was afebrile.  Vitals otherwise stable.  Initial CT scan showed remote bilateral lacunar infarcts.  No large acute infarct was demonstrated.  EEG was performed in the emergency room that did not show any epileptiform discharges.  It was recommended that patient be admitted for further work-up including MRI brain.  Unfortunately, since he does have a pacemaker, this would not be able to be performed over the weekend and will have to wait till Monday.  After evaluating patient and speaking at length with his daughter, it appeared that his mental status was at baseline and he was very anxious to discharge home.  His daughter felt that he was functioning at a level that she was comfortable taking him home.  She also explained that both the patient and family are not interested in aggressive work-up.  Patient is being followed by palliative care at home, and they would rather focus more on his quality of life.  Since the patient is already on anticoagulation, it is unlikely that performing an MRI of his brain would have a significant change in overall management.  Even if he was found to have an acute infarct, family would likely only want to pursue medical management.  They do not wish to stay in the hospital to pursue this and would rather address this in the outpatient setting if needed.  I do not suspect that his mental status change was related to an infection with rapid onset and improvement.  Family did not report any recent new medications.  At this  point, it seems reasonable to discharge the patient since his mental status is back to baseline as is his functional status.  No obvious concerning features on lab work.  He is advised to follow-up with his primary care physician and continue to work with palliative care as an outpatient.  Both patient and family are agreeable and were appreciative of the care that they received.  Discharge Diagnoses:  Principal  Problem:   Acute encephalopathy Active Problems:   Long term (current) use of anticoagulants   Persistent atrial fibrillation (HCC)   OSA treated with BiPAP   Chronic combined systolic and diastolic heart failure (HCC)   S/P TAVR (transcatheter aortic valve replacement)    Discharge Instructions  Discharge Instructions     Diet - low sodium heart healthy   Complete by: As directed    Increase activity slowly   Complete by: As directed       Allergies as of 08/29/2020       Reactions   Darifenacin Nausea Only, Other (See Comments)   dry mouth and dizziness ENABLEX   Codeine Other (See Comments)   Unknown reaction   Sulfamethoxazole-trimethoprim Other (See Comments)   Caused major confusion   Lisinopril Cough        Medication List     STOP taking these medications    diphenhydramine-acetaminophen 25-500 MG Tabs tablet Commonly known as: TYLENOL PM       TAKE these medications    acetaminophen 650 MG CR tablet Commonly known as: TYLENOL Take 650 mg by mouth every 8 (eight) hours as needed for pain (headache). What changed: Another medication with the same name was removed. Continue taking this medication, and follow the directions you see here.   BIOTIN PO Take 500 mcg by mouth daily.   cholecalciferol 1000 units tablet Commonly known as: VITAMIN D Take 1,000 Units by mouth daily.   clotrimazole 1 % cream Commonly known as: LOTRIMIN Apply to affected area 2 times daily What changed:  how much to take how to take this when to take this additional instructions   diphenoxylate-atropine 2.5-0.025 MG tablet Commonly known as: Lomotil Take 1 tablet by mouth 4 (four) times daily as needed for diarrhea or loose stools.   Fish Oil 1000 MG Caps Take 1,000 mg by mouth daily.   furosemide 20 MG tablet Commonly known as: LASIX Take 1 tablet (20 mg total) by mouth daily as needed. What changed:  when to take this reasons to take this   GLUCOSAMINE  PO Take 2,000 mg by mouth daily.   multivitamin with minerals Tabs tablet Take 1 tablet by mouth daily.   nitroGLYCERIN 0.4 MG SL tablet Commonly known as: NITROSTAT Place 1 tablet (0.4 mg total) under the tongue every 5 (five) minutes as needed. What changed: reasons to take this   ondansetron 4 MG disintegrating tablet Commonly known as: Zofran ODT Take 1 tablet (4 mg total) by mouth every 8 (eight) hours as needed for nausea or vomiting.   pantoprazole 40 MG tablet Commonly known as: PROTONIX Take 1 tablet (40 mg total) by mouth daily at 6 (six) AM.   PRESCRIPTION MEDICATION Inhale into the lungs at bedtime. CPAP   vitamin B-12 500 MCG tablet Commonly known as: CYANOCOBALAMIN Take 500 mcg by mouth daily.   vitamin C 1000 MG tablet Take 1,000 mg by mouth daily.   warfarin 2.5 MG tablet Commonly known as: COUMADIN Take as directed. If you are unsure how to take this medication,  talk to your nurse or doctor. Original instructions: Take 1-1.5 tablets (2.5-3.75 mg total) by mouth See admin instructions. Take 1 tablet (2.5 mg) by mouth daily at bedtime - except on 08/27/2020 take 1 1/2 tablets (3.75 mg). Recheck on Wednesday 09/01/2020   Zinc Oxide 12.8 % ointment Commonly known as: TRIPLE PASTE Apply topically 4 (four) times daily. What changed: how much to take       ASK your doctor about these medications    isosorbide mononitrate 30 MG 24 hr tablet Commonly known as: IMDUR Take 0.5 tablets (15 mg total) by mouth daily.   pravastatin 20 MG tablet Commonly known as: PRAVACHOL TAKE ONE TABLET BY MOUTH ONCE DAILY        Follow-up Information     Shirline Frees, MD. Schedule an appointment as soon as possible for a visit in 2 week(s).   Specialty: Family Medicine Contact information: [address] [place] [address] [phone]                Allergies  Allergen Reactions   Darifenacin Nausea Only and Other (See Comments)     dry mouth and dizziness ENABLEX    Codeine Other (See Comments)    Unknown reaction   Sulfamethoxazole-Trimethoprim Other (See Comments)    Caused major confusion   Lisinopril Cough    Consultations:    Procedures/Studies: CT Head Wo Contrast  Result Date: 08/28/2020 CLINICAL DATA:  Recent discharge 08/25/2020, mental status change EXAM: CT HEAD WITHOUT CONTRAST TECHNIQUE: Contiguous axial images were obtained from the base of the skull through the vertex without intravenous contrast. COMPARISON:  None. FINDINGS: Brain: Motion degradation may limit detection of subtle abnormality. Scattered small hypoattenuating foci in the bilateral basal ganglia, likely reflecting sequela of prior lacunar type infarcts. Findings on a background of more diffuse patchy and confluent regions of subcortical and deep white matter hypoattenuation likely reflecting microvascular angiopathy. Diffuse moderate parenchymal volume loss. No evidence of acute infarction, hemorrhage, hydrocephalus, extra-axial collection, visible mass lesion or mass effect. Partially empty appearance of the sella. Vascular: Atherosclerotic calcification of the carotid siphons and intradural vertebral arteries. No hyperdense vessel. Skull: No calvarial fracture or suspicious osseous lesion. No scalp swelling or hematoma. Sinuses/Orbits: Paranasal sinuses and mastoid air cells are predominantly clear. Right assistive hearing device is noted. Orbital structures are unremarkable aside from prior lens extractions. Other: None IMPRESSION: No acute large vascular territory or cortically based infarct. Small hypoattenuating foci in the bilateral basal ganglia, likely reflecting remote lacunar type infarcts. If there is persisting concern for acute infarction, MRI is more sensitive and specific for early features of ischemia. Background of microvascular angiopathy, parenchymal volume loss and intracranial atherosclerosis. Electronically Signed   By:  Lovena Le M.D.   On: 08/28/2020 22:04   CT ABDOMEN PELVIS W CONTRAST  Result Date: 08/08/2020 CLINICAL DATA:  Acute abdominal pain. Diarrhea. Abdominal guarding. Patient takes vancomycin. EXAM: CT ABDOMEN AND PELVIS WITH CONTRAST TECHNIQUE: Multidetector CT imaging of the abdomen and pelvis was performed using the standard protocol following bolus administration of intravenous contrast. CONTRAST:  136m OMNIPAQUE IOHEXOL 300 MG/ML  SOLN COMPARISON:  07/26/2020 FINDINGS: Lower chest: Peripheral fibrosis in the lung bases. Cardiac enlargement. Postoperative changes in the heart. Hepatobiliary: Cholelithiasis. No evidence of cholecystitis. No bile duct dilatation. No focal liver lesions. Pancreas: Unremarkable. No pancreatic ductal dilatation or surrounding inflammatory changes. Spleen: Normal in size without focal abnormality. Adrenals/Urinary Tract: No adrenal gland nodules. Diffuse renal parenchymal atrophy bilaterally. Focal  lesions demonstrated in the right renal hilum. These are low-attenuation with peripheral enhancement or wall thickening and some intraluminal fat. Largest lesion measures about 3.9 cm in diameter. This appearance is similar to prior study and may represent renal abscess or complex cyst. The left kidney demonstrates small parapelvic cysts in small parenchymal cysts. No hydronephrosis or hydroureter. Bladder is unremarkable. Stomach/Bowel: Stomach, small bowel, and colon are not abnormally distended. Diffusely fluid-filled colon consistent with history of diarrhea. No significant colonic or small bowel wall thickening. No pericolonic inflammatory changes. Vascular/Lymphatic: Aortic atherosclerosis. No enlarged abdominal or pelvic lymph nodes. Inferior vena caval filter is in place. Reproductive: Prostate gland is surgically absent. Other: No free air or free fluid in the abdomen. Abdominal wall musculature appears intact. Musculoskeletal: Scoliosis of the lumbar spine convex towards the  left. Prominent diffuse degenerative changes. No destructive bone lesions. IMPRESSION: 1. Diffusely fluid-filled colon without distention or significant wall thickening consistent with history of diarrhea. 2. Cholelithiasis without evidence of cholecystitis. 3. Bilateral renal atrophy. Complex cystic structure is demonstrated in the right renal hilum, similar to prior study. Possible abscesses or complex cysts. 4. Aortic atherosclerosis. Electronically Signed   By: Lucienne Capers M.D.   On: 08/08/2020 20:56   DG Chest Port 1 View  Result Date: 08/28/2020 CLINICAL DATA:  Questionable sepsis. Recovering from kidney infection. EXAM: PORTABLE CHEST 1 VIEW COMPARISON:  08/17/2020. FINDINGS: Cardiac silhouette normal in size. Stable changes from a previous aortic valve replacement. Left anterior chest wall biventricular cardioverter-defibrillator is stable. No mediastinal or hilar masses. Interstitial thickening noted in the lung bases, unchanged. Lungs otherwise clear. No convincing pleural effusion.  No pneumothorax. Skeletal structures are grossly intact. IMPRESSION: No acute cardiopulmonary disease. Electronically Signed   By: Lajean Manes M.D.   On: 08/28/2020 20:34   DG CHEST PORT 1 VIEW  Result Date: 08/17/2020 CLINICAL DATA:  PICC line placement EXAM: PORTABLE CHEST 1 VIEW COMPARISON:  07/26/2020 FINDINGS: Right PICC line is in place with the tip in the SVC. Left pacer remains in place, unchanged. Heart is normal size. Lungs clear. No effusions or acute bony abnormality. IMPRESSION: Right PICC line tip in the SVC. No active cardiopulmonary disease. Electronically Signed   By: Rolm Baptise M.D.   On: 08/17/2020 15:53   EEG adult  Result Date: 08/29/2020 Greta Doom, MD     08/29/2020  1:01 PM History: 87 year old male being evaluated for transient dysarthria Sedation: None Technique: This is a 21 channel routine scalp EEG performed at the bedside with bipolar and monopolar montages arranged  in accordance to the international 10/20 system of electrode placement. One channel was dedicated to EKG recording. Background: The background consists of intermixed alpha and beta activities. There is a well defined posterior dominant rhythm of 8-9 hz that attenuates with eye opening. Sleep is recorded with normal appearing structures. Photic stimulation: Physiologic driving is not performed EEG Abnormalities: None Clinical Interpretation: This normal EEG is recorded in the waking and sleep state. There was no seizure or seizure predisposition recorded on this study. Please note that lack of epileptiform activity on EEG does not preclude the possibility of epilepsy. Roland Rack, MD [facility] [phone] If 7pm- 7am, please page neurology on call as listed in [place].   VAS [place] UPPER EXTREMITY VENOUS DUPLEX  Result Date: 08/20/2020 UPPER VENOUS STUDY  Patient Name:  SEBASTIN DABDOUB  Date of Exam:   08/20/2020 Medical Rec #: [id number]  Accession #:    [id number] Date of Birth: [birth year]            Patient Gender: M Patient Age:   89Y Exam Location:  [facility] Procedure:      VAS [place] UPPER EXTREMITY VENOUS DUPLEX Referring Phys: [id number] JEFFREY T MCCLUNG --------------------------------------------------------------------------------  Indications: Swelling Risk Factors: DVT RUE (2014). Limitations: PICC line/bandage. Comparison Study: Previous exam 10/13/12 - Positive RUE subclavian, axillary, and                   basilic veins Performing Technologist: Jody Hill RVT, RDMS  Examination Guidelines: A complete evaluation includes B-mode imaging, spectral Doppler, color Doppler, and power Doppler as needed of all accessible portions of each vessel. Bilateral testing is considered an integral part of a complete examination. Limited examinations for reoccurring indications may be performed as noted.  Right Findings:  +----------+------------+---------+-----------+----------+-----------------+ RIGHT     CompressiblePhasicitySpontaneousProperties     Summary      +----------+------------+---------+-----------+----------+-----------------+ IJV           Full       Yes       Yes                                +----------+------------+---------+-----------+----------+-----------------+ Subclavian    Full       Yes       Yes                                +----------+------------+---------+-----------+----------+-----------------+ Axillary      Full       Yes       Yes                                +----------+------------+---------+-----------+----------+-----------------+ Brachial      Full       Yes       Yes                                +----------+------------+---------+-----------+----------+-----------------+ Radial        Full                                                    +----------+------------+---------+-----------+----------+-----------------+ Ulnar         Full                                                    +----------+------------+---------+-----------+----------+-----------------+ Cephalic    Partial      No        No               Age Indeterminate +----------+------------+---------+-----------+----------+-----------------+ Basilic       None       No        No                     Acute       +----------+------------+---------+-----------+----------+-----------------+  Left Findings: +----+------------+---------+-----------+----------+-------+ LEFTCompressiblePhasicitySpontaneousPropertiesSummary +----+------------+---------+-----------+----------+-------+  IJV     Full       Yes       Yes                      +----+------------+---------+-----------+----------+-------+  *See table(s) above for measurements and observations.  Diagnosing physician: Monica Martinez MD Electronically signed by Monica Martinez MD on 08/20/2020 at  6:42:10 PM.    Final    [place] EKG SITE RITE  Result Date: 08/17/2020 If Site Rite image not attached, placement could not be confirmed due to current cardiac rhythm.     Subjective: Patient denies any pain.  No shortness of breath.  He is aware he is in the hospital.  Discharge Exam: Vitals:   08/29/20 0900 08/29/20 0930 08/29/20 1000 08/29/20 1130  BP: (!) 149/89 (!) 151/83 (!) 155/80 (!) 149/80  Pulse: 65 77 63 70  Resp: (!) 21 19 (!) 25 20  Temp:    98 F (36.7 C)  TempSrc:      SpO2: 97% 97% 96% 98%    General: Pt is alert, awake, not in acute distress Cardiovascular: RRR, S1/S2 +, no rubs, no gallops Respiratory: CTA bilaterally, no wheezing, no rhonchi Abdominal: Soft, NT, ND, bowel sounds + Extremities: Trace edema, no cyanosis    The results of significant diagnostics from this hospitalization (including imaging, microbiology, ancillary and laboratory) are listed below for reference.     Microbiology: Recent Results (from the past 240 hour(s))  Blood culture (routine single)     Status: None (Preliminary result)   Collection Time: 08/28/20  9:00 PM   Specimen: BLOOD  Result Value Ref Range Status   Specimen Description BLOOD SITE NOT SPECIFIED  Final   Special Requests   Final    BOTTLES DRAWN AEROBIC AND ANAEROBIC Blood Culture results may not be optimal due to an inadequate volume of blood received in culture bottles   Culture   Final    NO GROWTH < 12 HOURS Performed at [facility], [place] [address], [place], [place] [address]    Report Status PENDING  Incomplete  SARS CORONAVIRUS 2 (TAT 6-24 HRS) Nasopharyngeal Nasopharyngeal Swab     Status: None   Collection Time: 08/29/20  4:15 AM   Specimen: Nasopharyngeal Swab  Result Value Ref Range Status   SARS Coronavirus 2 NEGATIVE NEGATIVE Final    Comment: (NOTE) SARS-CoV-2 target nucleic acids are NOT DETECTED.  The SARS-CoV-2 RNA is generally detectable in upper and lower respiratory specimens  during the acute phase of infection. Negative results do not preclude SARS-CoV-2 infection, do not rule out co-infections with other pathogens, and should not be used as the sole basis for treatment or other patient management decisions. Negative results must be combined with clinical observations, patient history, and epidemiological information. The expected result is Negative.  Fact Sheet for Patients: SugarRoll.be  Fact Sheet for Healthcare Providers: https://www.woods-mathews.com/  This test is not yet approved or cleared by the [place] FDA and  has been authorized for detection and/or diagnosis of SARS-CoV-2 by FDA under an Emergency Use Authorization (EUA). This EUA will remain  in effect (meaning this test can be used) for the duration of the COVID-19 declaration under Se ction 564(b)(1) of the Act, 21 U.S.C. section 360bbb-3(b)(1), unless the authorization is terminated or revoked sooner.  Performed at [facility], [place] [address], [place],  [address]      Labs: BNP (last 3 results) No results for input(s):  BNP in the last 8760 hours. Basic Metabolic Panel: Recent Labs  Lab 08/23/20 0439 08/25/20 0428 08/28/20 2100  NA 137 140 138  K 3.3* 4.9 4.2  CL 110 109 103  CO2 '23 27 26  '$ GLUCOSE 122* 99 124*  BUN '19 17 10  '$ CREATININE 0.90 1.08 1.41*  CALCIUM 8.4* 9.0 9.2  MG  --  1.8  --    Liver Function Tests: Recent Labs  Lab 08/28/20 2100  AST 37  ALT 14  ALKPHOS 177*  BILITOT 1.1  PROT 6.4*  ALBUMIN 2.7*   No results for input(s): LIPASE, AMYLASE in the last 168 hours. No results for input(s): AMMONIA in the last 168 hours. CBC: Recent Labs  Lab 08/23/20 0439 08/25/20 0428 08/28/20 2100  WBC 8.0 5.9 6.8  NEUTROABS  --   --  4.6  HGB 11.2* 11.5* 13.1  HCT 35.0* 36.7* 41.4  MCV 81.2 81.6 81.8  PLT 149* 186 290   Cardiac Enzymes: No results for input(s): CKTOTAL, CKMB, CKMBINDEX,  TROPONINI in the last 168 hours. BNP: Invalid input(s): POCBNP CBG: No results for input(s): GLUCAP in the last 168 hours. D-Dimer No results for input(s): DDIMER in the last 72 hours. Hgb A1c No results for input(s): HGBA1C in the last 72 hours. Lipid Profile No results for input(s): CHOL, HDL, LDLCALC, TRIG, CHOLHDL, LDLDIRECT in the last 72 hours. Thyroid function studies No results for input(s): TSH, T4TOTAL, T3FREE, THYROIDAB in the last 72 hours.  Invalid input(s): FREET3 Anemia work up No results for input(s): VITAMINB12, FOLATE, FERRITIN, TIBC, IRON, RETICCTPCT in the last 72 hours. Urinalysis    Component Value Date/Time   COLORURINE YELLOW 05/15/2020 2307   APPEARANCEUR CLEAR 05/15/2020 2307   LABSPEC 1.033 (H) 05/15/2020 2307   PHURINE 7.0 05/15/2020 2307   GLUCOSEU NEGATIVE 05/15/2020 2307   HGBUR NEGATIVE 05/15/2020 2307   BILIRUBINUR NEGATIVE 05/15/2020 2307   KETONESUR NEGATIVE 05/15/2020 2307   PROTEINUR NEGATIVE 05/15/2020 2307   UROBILINOGEN 0.2 09/19/2014 1027   NITRITE NEGATIVE 05/15/2020 2307   LEUKOCYTESUR NEGATIVE 05/15/2020 2307   Sepsis Labs Invalid input(s): PROCALCITONIN,  WBC,  LACTICIDVEN Microbiology Recent Results (from the past 240 hour(s))  Blood culture (routine single)     Status: None (Preliminary result)   Collection Time: 08/28/20  9:00 PM   Specimen: BLOOD  Result Value Ref Range Status   Specimen Description BLOOD SITE NOT SPECIFIED  Final   Special Requests   Final    BOTTLES DRAWN AEROBIC AND ANAEROBIC Blood Culture results may not be optimal due to an inadequate volume of blood received in culture bottles   Culture   Final    NO GROWTH < 12 HOURS Performed at [facility], [place] [address] [place], [place] [address]    Report Status PENDING  Incomplete  SARS CORONAVIRUS 2 (TAT 6-24 HRS) Nasopharyngeal Nasopharyngeal Swab     Status: None   Collection Time: 08/29/20  4:15 AM   Specimen: Nasopharyngeal Swab  Result  Value Ref Range Status   SARS Coronavirus 2 NEGATIVE NEGATIVE Final    Comment: (NOTE) SARS-CoV-2 target nucleic acids are NOT DETECTED.  The SARS-CoV-2 RNA is generally detectable in upper and lower respiratory specimens during the acute phase of infection. Negative results do not preclude SARS-CoV-2 infection, do not rule out co-infections with other pathogens, and should not be used as the sole basis for treatment or other patient management decisions. Negative results must be combined with clinical observations, patient history,  and epidemiological information. The expected result is Negative.  Fact Sheet for Patients: SugarRoll.be  Fact Sheet for Healthcare Providers: https://www.woods-mathews.com/  This test is not yet approved or cleared by the [place] FDA and  has been authorized for detection and/or diagnosis of SARS-CoV-2 by FDA under an Emergency Use Authorization (EUA). This EUA will remain  in effect (meaning this test can be used) for the duration of the COVID-19 declaration under Se ction 564(b)(1) of the Act, 21 U.S.C. section 360bbb-3(b)(1), unless the authorization is terminated or revoked sooner.  Performed at [facility], [place] [address] [place], [place] [address]      Time coordinating discharge: 48mns  SIGNED:   JKathie Dike MD  [facility] 08/29/2020, 6:09 PM   If 7PM-7AM, please contact night-coverage [email or web address]

## 2020-08-29 NOTE — ED Notes (Signed)
EEG tech at bedside. 

## 2020-08-29 NOTE — Procedures (Signed)
History: 87 year old male being evaluated for transient dysarthria  Sedation: None  Technique: This is a 21 channel routine scalp EEG performed at the bedside with bipolar and monopolar montages arranged in accordance to the international 10/20 system of electrode placement. One channel was dedicated to EKG recording.    Background: The background consists of intermixed alpha and beta activities. There is a well defined posterior dominant rhythm of 8-9 hz that attenuates with eye opening. Sleep is recorded with normal appearing structures.   Photic stimulation: Physiologic driving is not performed  EEG Abnormalities: None  Clinical Interpretation: This normal EEG is recorded in the waking and sleep state. There was no seizure or seizure predisposition recorded on this study. Please note that lack of epileptiform activity on EEG does not preclude the possibility of epilepsy.   Roland Rack, MD [facility] [phone]  If 7pm- 7am, please page neurology on call as listed in [facility] [place].

## 2020-08-29 NOTE — H&P (Signed)
History and Physical    [name] [id number] DOB: Apr 03, 1931 DOA: 08/28/2020  PCP: Shirline Frees, MD  Patient coming from: Home.   History obtained from patient's wife and patient.  Chief Complaint: Confusion and slurred speech.  HPI: [name] is a 87 y.o. male with history of chronic combined systolic and diastolic CHF, history of TAVR, pacemaker placement, persistent A. fib, history of DVT and PE status post IVC filter placement was recently admitted for E. coli diarrhea discharged about 4 days ago also was on IV antibiotics for ESBL perinephric abscess completed antibiotic course about 3 weeks ago was found to be suddenly confused and having some slurred speech around 5 PM last evening.  This lasted for around 45 minutes and resolved.  Patient was brought to the ER.  Over the last 24 hours patient has been using some Lasix for lower extremity edema.  ED Course: In the ER notes patient was appearing confused and slightly agitated for which patient was given Haldol and Ativan CT head is unremarkable.  Patient's symptoms completely resolved.  Discussed with on-call neurologist Dr. Lorrin Goodell advise getting MRI brain and if positive further stroke work-up.  Patient's labs show mildly increased creatinine from recent past.  Patient has been using Lasix.  INR is 1.8 COVID test was negative.  Chest x-ray unremarkable.  Lactic acid was mildly elevated which improved with fluids.  Review of Systems: As per HPI, rest all negative.   Past Medical History:  Diagnosis Date   Aortic valve stenosis 04/05/2015   Formatting of this note might be different from the original. Overview:  ECHO 5/15 Formatting of this note might be different from the original. Overview:  Overview:  ECHO 5/15   Arthritis    "knees, right shoulder" (03/14/2017)   Atrial fibrillation, persistent ([place]) 09/19/2014   CAD (coronary artery disease), native coronary artery    Cath 2011 LHC (08/2009):~ Proximal  LAD 30%, mid to distal LAD 25%, ostial small D1 75% mid AV groove circumflex 99% been subtotal stenosis, proximal to mid RCA 25-30%, mid RCA 30%, mid PDA 30%, EF 50% with inferior hypokinesis.;    July 2011  PCI and DES to circumflex Dr. Olevia Perches   ETT-Myoview (06/2013):  Inferolateral scar, mild peri-infarct ischemia, EF 40%; Intermediate Risk   ECHO EF 55% 03/2013    CAD (coronary artery disease), native coronary artery 04/05/2015   Cath 2011 LHC (08/2009):~ Proximal LAD 30%, mid to distal LAD 25%, ostial small D1 75% mid AV groove circumflex 99% been subtotal stenosis, proximal to mid RCA 25-30%, mid RCA 30%, mid PDA 30%, EF 50% with inferior hypokinesis.;    July 2011  PCI and DES to circumflex Dr. Olevia Perches   ETT-Myoview (06/2013):  Inferolateral scar, mild peri-infarct ischemia, EF 40%; Intermediate Risk   ECHO EF 55% 03/2013     Cardiac pacemaker in situ 03/14/2017   Biventricular St. Jude inserted 03/14/17 Dr. Lovena Le for second degree heart block    Chronic combined systolic and diastolic heart failure ([place]) 11/19/2017   CKD (chronic kidney disease), stage III ([place])    Gastro-esophageal reflux disease without esophagitis 09/24/2009   GERD    History of infection of prosthetic knee 04/05/2015   Treated with debridement and irrigation followed by 6 months of triple antibiotics   History of kidney stones    History of peptic ulcer 1970s?   History of prostate cancer    Radical prostatectomy in 2006 Dr. Terance Hart    Hypertensive heart disease  Internal hemorrhoids 09/11/2018   Long term (current) use of anticoagulants 10/06/2011   Mixed hyperlipidemia    Moderate persistent asthma without complication 99991111   Noise effect on both inner ears 08/14/2018   Obesity (BMI 30-39.9)    OSA on CPAP    OSA treated with BiPAP 11/15/2015   Persistent atrial fibrillation ([place]) 09/19/2014   CHA2DS2VASC score 5  Cardioversion 10/08/2014 was on amiodarone until 2018    Personal history of DVT and pulmonary embolism   (deep vein thrombosis)    Initial DVT in 2006 after prostate surgery and had Greenfield filter placed Bilateral  PE 2013 and placed back on warfarin DVT of right subclavian vein on doppler 10/2012 at time of knee infection    Personal history of malignant neoplasm of prostate    Radical prostatectomy in 2006 Dr. Terance Hart     Presbycusis of both ears 08/14/2018   Severe aortic stenosis    Severe aortic stenosis 03/18/2018   Urinary incontinence    MULTIPLE BLADDER SURGERIES - STATES NO URINARY SPHINCTER - PT'S UROLOGIST IS AT [facility]- DR. PETERSON  ( LAST VISIT WAS 09/15/11 )    Past Surgical History:  Procedure Laterality Date   APPENDECTOMY     BI-VENTRICULAR PACEMAKER INSERTION (CRT-P)  03/14/2017   BIV PACEMAKER INSERTION CRT-P N/A 03/14/2017   Procedure: BIV PACEMAKER INSERTION CRT-P;  Surgeon: Evans Lance, MD;  Location: [facility];  Service: Cardiovascular;  Laterality: N/A;   CARDIOVERSION N/A 10/08/2014   Procedure: CARDIOVERSION;  Surgeon: Jacolyn Reedy, MD;  Location: [place];  Service: Cardiovascular;  Laterality: N/A;   CATARACT EXTRACTION W/ INTRAOCULAR LENS  IMPLANT, BILATERAL Bilateral    CORONARY ANGIOPLASTY WITH STENT PLACEMENT  08/25/2009   DES-mid LCx 08/2009; 30% pLAD, 25% m/dLAD, 75% ostial D1, 99% mLCx s/p DES, 25-30% p/mRCA, 40% mRCA, 30% mPDA stenoses; LVEF 50%, inf hypokinesis   CORONARY STENT INTERVENTION N/A 03/18/2018   Procedure: CORONARY STENT INTERVENTION;  Surgeon: Sherren Mocha, MD;  Location: [facility];  Service: Cardiovascular;  Laterality: N/A;   EXCISIONAL HEMORRHOIDECTOMY     I & D KNEE WITH POLY EXCHANGE Left 10/09/2012   Procedure: IRRIGATION AND DEBRIDEMENT LEFT KNEE WITH POLY REVISION;  Surgeon: Gearlean Alf, MD;  Location: [facility];  Service: Orthopedics;  Laterality: Left;   INCISION AND DRAINAGE ABSCESS N/A 05/16/2020   Procedure: INCISION AND DRAINAGE  PERIRECTAL ABSCESS;  Surgeon: Rolm Bookbinder, MD;  Location: [facility];   Service: General;  Laterality: N/A;   INGUINAL HERNIA REPAIR Right    INTRAOPERATIVE TRANSTHORACIC ECHOCARDIOGRAM  03/19/2018   Procedure: Intraoperative Transthoracic Echocardiogram;  Surgeon: Sherren Mocha, MD;  Location: [place];  Service: Open Heart Surgery;;   IR FLUORO GUIDE CV LINE RIGHT  06/14/2020   IR RADIOLOGIST EVAL & MGMT  06/10/2020   JOINT REPLACEMENT     KNEE ARTHROTOMY Left 10/09/2012   Procedure: LEFT KNEE ARTHROTOMY;  Surgeon: Gearlean Alf, MD;  Location: [facility];  Service: Orthopedics;  Laterality: Left;   LEFT HEART CATH AND CORONARY ANGIOGRAPHY N/A 02/28/2018   Procedure: LEFT HEART CATH AND CORONARY ANGIOGRAPHY;  Surgeon: Sherren Mocha, MD;  Location: [facility];  Service: Cardiovascular;  Laterality: N/A;   LEFT HEART CATH AND CORONARY ANGIOGRAPHY N/A 09/12/2019   Procedure: LEFT HEART CATH AND CORONARY ANGIOGRAPHY;  Surgeon: Sherren Mocha, MD;  Location: [facility];  Service: Cardiovascular;  Laterality: N/A;   PROSTATECTOMY  04/25/2004   REPLACEMENT TOTAL KNEE  Bilateral    SKIN BIOPSY     "off nose; wasn't cancer; it was tested" (03/14/2017)   TRANSCATHETER AORTIC VALVE REPLACEMENT, TRANSFEMORAL N/A 03/19/2018   Procedure: TRANSCATHETER AORTIC VALVE REPLACEMENT, TRANSFEMORAL;  Surgeon: Sherren Mocha, MD;  Location: [place];  Service: Open Heart Surgery;  Laterality: N/A;   Uretheral implants     multiple for incontinence   VENA CAVA FILTER PLACEMENT       reports that he quit smoking about 59 years ago. His smoking use included cigarettes. He has a 10.00 pack-year smoking history. He has never used smokeless tobacco. He reports that he does not drink alcohol and does not use drugs.  Allergies  Allergen Reactions   Darifenacin Nausea Only and Other (See Comments)    dry mouth and dizziness ENABLEX    Codeine Other (See Comments)    Unknown reaction   Sulfamethoxazole-Trimethoprim Other (See Comments)    Caused major confusion   Lisinopril Cough     Family History  Problem Relation Age of Onset   Melanoma Father    Melanoma Brother     Prior to Admission medications   Medication Sig Start Date End Date Taking? Authorizing Provider  acetaminophen (TYLENOL) 650 MG CR tablet Take 650 mg by mouth every 8 (eight) hours as needed for pain (headache).   Yes [provider]  Ascorbic Acid (VITAMIN C) 1000 MG tablet Take 1,000 mg by mouth daily.   Yes [provider]  BIOTIN PO Take 500 mcg by mouth daily.   Yes [provider]  cholecalciferol (VITAMIN D) 1000 UNITS tablet Take 1,000 Units by mouth daily.   Yes [provider]  clotrimazole (LOTRIMIN) 1 % cream Apply to affected area 2 times daily Patient taking differently: Apply 1 application topically 2 (two) times daily. 08/08/20  Yes Sherwood Gambler, MD  diphenhydramine-acetaminophen (TYLENOL PM) 25-500 MG TABS tablet Take 2 tablets by mouth at bedtime.   Yes [provider]  diphenoxylate-atropine (LOMOTIL) 2.5-0.025 MG tablet Take 1 tablet by mouth 4 (four) times daily as needed for diarrhea or loose stools. 08/08/20  Yes Sherwood Gambler, MD  furosemide (LASIX) 20 MG tablet Take 1 tablet (20 mg total) by mouth daily. 08/28/20 11/26/20 Yes Furth, Cadence H, PA-C  Glucosamine HCl (GLUCOSAMINE PO) Take 2,000 mg by mouth daily.   Yes [provider]  isosorbide mononitrate (IMDUR) 30 MG 24 hr tablet Take 0.5 tablets (15 mg total) by mouth daily. Patient taking differently: Take 15 mg by mouth at bedtime. 02/05/20  Yes Richardo Priest, MD  Multiple Vitamin (MULTIVITAMIN WITH MINERALS) TABS tablet Take 1 tablet by mouth daily.   Yes [provider]  nitroGLYCERIN (NITROSTAT) 0.4 MG SL tablet Place 1 tablet (0.4 mg total) under the tongue every 5 (five) minutes as needed. Patient taking differently: Place 0.4 mg under the tongue every 5 (five) minutes as needed for chest pain. 07/14/19  Yes Richardo Priest, MD  Omega-3 Fatty Acids  (FISH OIL) 1000 MG CAPS Take 1,000 mg by mouth daily.   Yes [provider]  ondansetron (ZOFRAN ODT) 4 MG disintegrating tablet Take 1 tablet (4 mg total) by mouth every 8 (eight) hours as needed for nausea or vomiting. 08/08/20  Yes Sherwood Gambler, MD  pantoprazole (PROTONIX) 40 MG tablet Take 1 tablet (40 mg total) by mouth daily at 6 (six) AM. 05/29/20  Yes Sheikh, [place], DO  pravastatin (PRAVACHOL) 20 MG tablet TAKE ONE TABLET BY MOUTH ONCE DAILY Patient  taking differently: Take 20 mg by mouth at bedtime. 01/08/20  Yes Richardo Priest, MD  PRESCRIPTION MEDICATION Inhale into the lungs at bedtime. CPAP   Yes [provider]  vitamin B-12 (CYANOCOBALAMIN) 500 MCG tablet Take 500 mcg by mouth daily.   Yes [provider]  warfarin (COUMADIN) 2.5 MG tablet Take 1 tablet daily or as directed by [facility] Clinic Patient taking differently: Take 2.5-3.75 mg by mouth See admin instructions. Take 1 tablet (2.5 mg) by mouth daily at bedtime - except on 08/27/2020 take 1 1/2 tablets (3.75 mg). Recheck on Wednesday 09/01/2020 04/16/20  Yes Richardo Priest, MD  Zinc Oxide (TRIPLE PASTE) 12.8 % ointment Apply topically 4 (four) times daily. Patient taking differently: Apply 1 application topically 4 (four) times daily. 08/25/20  Yes Cherene Altes, MD  acetaminophen (TYLENOL) 325 MG tablet Take 2 tablets (650 mg total) by mouth every 6 (six) hours as needed for mild pain, fever or headache. Patient not taking: No sig reported 08/25/20   Cherene Altes, MD    Physical Exam: Constitutional: Moderately built and nourished. Vitals:   08/28/20 2245 08/28/20 2300 08/28/20 2315 08/28/20 2330  BP: (!) 174/75 (!) 167/78 (!) 167/78 (!) 165/74  Pulse: 60 64 62 (!) 59  Resp: '10 20 16 16  '$ Temp:      SpO2: 99% 93% 96% 99%   Eyes: Anicteric no pallor. ENMT: No discharge from the ears eyes nose and mouth. Neck: No mass felt.  No neck rigidity. Respiratory: No rhonchi or  crepitations. Cardiovascular: S1-S2 heard. Abdomen: Soft nontender bowel sounds present. Musculoskeletal: Mild pain on moving right upper extremity which patient's wife states that has been there since last admission after patient had PICC line associated DVT. Skin: No rash. Neurologic: Alert awake oriented to person and place.  Moving all extremities 5 x 5 right upper extremity slightly decreased due to pain from recent DVT. Psychiatric: Oriented to place and person.   Labs on Admission: I have personally reviewed following labs and imaging studies  CBC: Recent Labs  Lab 08/23/20 0439 08/25/20 0428 08/28/20 2100  WBC 8.0 5.9 6.8  NEUTROABS  --   --  4.6  HGB 11.2* 11.5* 13.1  HCT 35.0* 36.7* 41.4  MCV 81.2 81.6 81.8  PLT 149* 186 [id number]   Basic Metabolic Panel: Recent Labs  Lab 08/23/20 0439 08/25/20 0428 08/28/20 2100  NA 137 140 138  K 3.3* 4.9 4.2  CL 110 109 103  CO2 '23 27 26  '$ GLUCOSE 122* 99 124*  BUN '19 17 10  '$ CREATININE 0.90 1.08 1.41*  CALCIUM 8.4* 9.0 9.2  MG  --  1.8  --    GFR: Estimated Creatinine Clearance: 42.2 mL/min (A) (by C-G formula based on SCr of 1.41 mg/dL (H)). Liver Function Tests: Recent Labs  Lab 08/28/20 2100  AST 37  ALT 14  ALKPHOS 177*  BILITOT 1.1  PROT 6.4*  ALBUMIN 2.7*   No results for input(s): LIPASE, AMYLASE in the last 168 hours. No results for input(s): AMMONIA in the last 168 hours. Coagulation Profile: Recent Labs  Lab 08/23/20 0439 08/24/20 0445 08/25/20 0428 08/27/20 0000 08/28/20 2100  INR 2.0* 1.6* 1.6* 1.5* 1.8*   Cardiac Enzymes: No results for input(s): CKTOTAL, CKMB, CKMBINDEX, TROPONINI in the last 168 hours. BNP (last 3 results) No results for input(s): PROBNP in the last 8760 hours. HbA1C: No results for input(s): HGBA1C in the last 72 hours. CBG: No results for input(s): GLUCAP  in the last 168 hours. Lipid Profile: No results for input(s): CHOL, HDL, LDLCALC, TRIG, CHOLHDL, LDLDIRECT in the  last 72 hours. Thyroid Function Tests: No results for input(s): TSH, T4TOTAL, FREET4, T3FREE, THYROIDAB in the last 72 hours. Anemia Panel: No results for input(s): VITAMINB12, FOLATE, FERRITIN, TIBC, IRON, RETICCTPCT in the last 72 hours. Urine analysis:    Component Value Date/Time   COLORURINE YELLOW 05/15/2020 2307   APPEARANCEUR CLEAR 05/15/2020 2307   LABSPEC 1.033 (H) 05/15/2020 2307   PHURINE 7.0 05/15/2020 2307   GLUCOSEU NEGATIVE 05/15/2020 2307   HGBUR NEGATIVE 05/15/2020 2307   BILIRUBINUR NEGATIVE 05/15/2020 2307   KETONESUR NEGATIVE 05/15/2020 2307   PROTEINUR NEGATIVE 05/15/2020 2307   UROBILINOGEN 0.2 09/19/2014 1027   NITRITE NEGATIVE 05/15/2020 2307   LEUKOCYTESUR NEGATIVE 05/15/2020 2307   Sepsis Labs: '@LABRCNTIP'$ (procalcitonin:4,lacticidven:4) )No results found for this or any previous visit (from the past 240 hour(s)).   Radiological Exams on Admission: CT Head Wo Contrast  Result Date: 08/28/2020 CLINICAL DATA:  Recent discharge 08/25/2020, mental status change EXAM: CT HEAD WITHOUT CONTRAST TECHNIQUE: Contiguous axial images were obtained from the base of the skull through the vertex without intravenous contrast. COMPARISON:  None. FINDINGS: Brain: Motion degradation may limit detection of subtle abnormality. Scattered small hypoattenuating foci in the bilateral basal ganglia, likely reflecting sequela of prior lacunar type infarcts. Findings on a background of more diffuse patchy and confluent regions of subcortical and deep white matter hypoattenuation likely reflecting microvascular angiopathy. Diffuse moderate parenchymal volume loss. No evidence of acute infarction, hemorrhage, hydrocephalus, extra-axial collection, visible mass lesion or mass effect. Partially empty appearance of the sella. Vascular: Atherosclerotic calcification of the carotid siphons and intradural vertebral arteries. No hyperdense vessel. Skull: No calvarial fracture or suspicious osseous  lesion. No scalp swelling or hematoma. Sinuses/Orbits: Paranasal sinuses and mastoid air cells are predominantly clear. Right assistive hearing device is noted. Orbital structures are unremarkable aside from prior lens extractions. Other: None IMPRESSION: No acute large vascular territory or cortically based infarct. Small hypoattenuating foci in the bilateral basal ganglia, likely reflecting remote lacunar type infarcts. If there is persisting concern for acute infarction, MRI is more sensitive and specific for early features of ischemia. Background of microvascular angiopathy, parenchymal volume loss and intracranial atherosclerosis. Electronically Signed   By: Lovena Le M.D.   On: 08/28/2020 22:04   DG Chest Port 1 View  Result Date: 08/28/2020 CLINICAL DATA:  Questionable sepsis. Recovering from kidney infection. EXAM: PORTABLE CHEST 1 VIEW COMPARISON:  08/17/2020. FINDINGS: Cardiac silhouette normal in size. Stable changes from a previous aortic valve replacement. Left anterior chest wall biventricular cardioverter-defibrillator is stable. No mediastinal or hilar masses. Interstitial thickening noted in the lung bases, unchanged. Lungs otherwise clear. No convincing pleural effusion.  No pneumothorax. Skeletal structures are grossly intact. IMPRESSION: No acute cardiopulmonary disease. Electronically Signed   By: Lajean Manes M.D.   On: 08/28/2020 20:34    EKG: Independently reviewed.  Paced rhythm.  Assessment/Plan Principal Problem:   Acute encephalopathy Active Problems:   Long term (current) use of anticoagulants   Persistent atrial fibrillation (HCC)   OSA treated with BiPAP   Chronic combined systolic and diastolic heart failure ([facility])   S/P TAVR (transcatheter aortic valve replacement)    Acute encephalopathy with slurring speech which has resolved.  Discussed with on-call neurologist Dr. Lorrin Goodell advise getting MRI brain.  Patient does have a pacemaker we need to see if it is  compatible pacemaker.  If MRI does show  stroke and further stroke work-up. Persistent A. fib and history of pacemaker placement on Coumadin which is subtherapeutic with INR 1.8 we will ask pharmacy to dose. Chronic combined systolic and diastolic CHF last EF measured in February 2021 was 50 to 55%.  Appears compensated.  Was recently taking Lasix. Acute renal failure with creatinine increasing from 1.08-1.4 in the last 4 days.  Likely due to patient taking Lasix.  Since patient's lactic acid was mildly elevated and patient was given fluids in the ER and improved.  Closely monitor metabolic panel. Status post TAVR. History of PE and DVT status post IVC placement and also is on Coumadin. Sleep apnea.   DVT prophylaxis: Coumadin. Code Status: DNR. Family Communication: Patient's wife. Disposition Plan: Home when stable. Consults called: Discussed with neurologist. Admission status: Observation.   Rise Patience MD [facility] Pager [phone].  If 7PM-7AM, please contact night-coverage [email or web address] Password [facility]  08/29/2020, 12:44 AM

## 2020-08-29 NOTE — Progress Notes (Signed)
ANTICOAGULATION CONSULT NOTE - Initial Consult  Pharmacy Consult for coumadin Indication: atrial fibrillation   Labs: Recent Labs    08/27/20 0000 08/28/20 2100  HGB  --  13.1  HCT  --  41.4  PLT  --  290  APTT  --  37*  LABPROT  --  21.2*  INR 1.5* 1.8*  CREATININE  --  1.41*     Medical History: Past Medical History:  Diagnosis Date   Aortic valve stenosis 04/05/2015   Formatting of this note might be different from the original. Overview:  ECHO 5/15 Formatting of this note might be different from the original. Overview:  Overview:  ECHO 5/15   Arthritis    "knees, right shoulder" (03/14/2017)   Atrial fibrillation, persistent ([place]) 09/19/2014   CAD (coronary artery disease), native coronary artery    Cath 2011 LHC (08/2009):~ Proximal LAD 30%, mid to distal LAD 25%, ostial small D1 75% mid AV groove circumflex 99% been subtotal stenosis, proximal to mid RCA 25-30%, mid RCA 30%, mid PDA 30%, EF 50% with inferior hypokinesis.;    July 2011  PCI and DES to circumflex Dr. Olevia Perches   ETT-Myoview (06/2013):  Inferolateral scar, mild peri-infarct ischemia, EF 40%; Intermediate Risk   ECHO EF 55% 03/2013    CAD (coronary artery disease), native coronary artery 04/05/2015   Cath 2011 LHC (08/2009):~ Proximal LAD 30%, mid to distal LAD 25%, ostial small D1 75% mid AV groove circumflex 99% been subtotal stenosis, proximal to mid RCA 25-30%, mid RCA 30%, mid PDA 30%, EF 50% with inferior hypokinesis.;    July 2011  PCI and DES to circumflex Dr. Olevia Perches   ETT-Myoview (06/2013):  Inferolateral scar, mild peri-infarct ischemia, EF 40%; Intermediate Risk   ECHO EF 55% 03/2013     Cardiac pacemaker in situ 03/14/2017   Biventricular St. Jude inserted 03/14/17 Dr. Lovena Le for second degree heart block    Chronic combined systolic and diastolic heart failure ([place]) 11/19/2017   CKD (chronic kidney disease), stage III ([place])    Gastro-esophageal reflux disease without esophagitis 09/24/2009   GERD    History of  infection of prosthetic knee 04/05/2015   Treated with debridement and irrigation followed by 6 months of triple antibiotics   History of kidney stones    History of peptic ulcer 1970s?   History of prostate cancer    Radical prostatectomy in 2006 Dr. Terance Hart    Hypertensive heart disease    Internal hemorrhoids 09/11/2018   Long term (current) use of anticoagulants 10/06/2011   Mixed hyperlipidemia    Moderate persistent asthma without complication 99991111   Noise effect on both inner ears 08/14/2018   Obesity (BMI 30-39.9)    OSA on CPAP    OSA treated with BiPAP 11/15/2015   Persistent atrial fibrillation ([place]) 09/19/2014   CHA2DS2VASC score 5  Cardioversion 10/08/2014 was on amiodarone until 2018    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)    Initial DVT in 2006 after prostate surgery and had Greenfield filter placed Bilateral  PE 2013 and placed back on warfarin DVT of right subclavian vein on doppler 10/2012 at time of knee infection    Personal history of malignant neoplasm of prostate    Radical prostatectomy in 2006 Dr. Terance Hart     Presbycusis of both ears 08/14/2018   Severe aortic stenosis    Severe aortic stenosis 03/18/2018   Urinary incontinence    MULTIPLE BLADDER SURGERIES - STATES NO  URINARY SPHINCTER - PT'S UROLOGIST IS AT [facility]- DR. PETERSON  ( LAST VISIT WAS 09/15/11 )    Medications:  See medication history  Assessment: 87 yo man on coumadin PTA to continue.  INR 1.8, last dose 7/22 Goal of Therapy:  INR 2-3 Monitor platelets by anticoagulation protocol: Yes   Plan:  Coumadin dose 2.5 mg tomorrow at 4pm as protocol Daily PT/INR  Excell Seltzer Poteet 08/29/2020,12:48 AM

## 2020-08-29 NOTE — Progress Notes (Signed)
EEG completed, results pending. 

## 2020-08-29 NOTE — ED Notes (Signed)
Pt ambulated using a walker with steady gait,

## 2020-08-30 DIAGNOSIS — N200 Calculus of kidney: Secondary | ICD-10-CM | POA: Diagnosis not present

## 2020-08-30 DIAGNOSIS — I130 Hypertensive heart and chronic kidney disease with heart failure and stage 1 through stage 4 chronic kidney disease, or unspecified chronic kidney disease: Secondary | ICD-10-CM | POA: Diagnosis not present

## 2020-08-30 DIAGNOSIS — I2510 Atherosclerotic heart disease of native coronary artery without angina pectoris: Secondary | ICD-10-CM | POA: Diagnosis not present

## 2020-08-30 DIAGNOSIS — I4821 Permanent atrial fibrillation: Secondary | ICD-10-CM | POA: Diagnosis not present

## 2020-08-30 DIAGNOSIS — N119 Chronic tubulo-interstitial nephritis, unspecified: Secondary | ICD-10-CM | POA: Diagnosis not present

## 2020-08-30 DIAGNOSIS — K611 Rectal abscess: Secondary | ICD-10-CM | POA: Diagnosis not present

## 2020-09-01 ENCOUNTER — Ambulatory Visit (INDEPENDENT_AMBULATORY_CARE_PROVIDER_SITE_OTHER): Payer: Medicare Other | Admitting: Internal Medicine

## 2020-09-01 DIAGNOSIS — I2510 Atherosclerotic heart disease of native coronary artery without angina pectoris: Secondary | ICD-10-CM | POA: Diagnosis not present

## 2020-09-01 DIAGNOSIS — I130 Hypertensive heart and chronic kidney disease with heart failure and stage 1 through stage 4 chronic kidney disease, or unspecified chronic kidney disease: Secondary | ICD-10-CM | POA: Diagnosis not present

## 2020-09-01 DIAGNOSIS — N119 Chronic tubulo-interstitial nephritis, unspecified: Secondary | ICD-10-CM | POA: Diagnosis not present

## 2020-09-01 DIAGNOSIS — K611 Rectal abscess: Secondary | ICD-10-CM | POA: Diagnosis not present

## 2020-09-01 DIAGNOSIS — N200 Calculus of kidney: Secondary | ICD-10-CM | POA: Diagnosis not present

## 2020-09-01 DIAGNOSIS — Z5181 Encounter for therapeutic drug level monitoring: Secondary | ICD-10-CM | POA: Diagnosis not present

## 2020-09-01 DIAGNOSIS — I4821 Permanent atrial fibrillation: Secondary | ICD-10-CM | POA: Diagnosis not present

## 2020-09-01 LAB — URINE CULTURE: Culture: >=100000 — AB

## 2020-09-01 LAB — POCT INR: INR: 1.8 — AB (ref 2.0–3.0)

## 2020-09-01 NOTE — Patient Instructions (Signed)
Description   Spoke to [name] from [employer] while  in patient's home, instructed for pt to take 1 tablet of warfarin today and then continue to take 1 tablet of warfarin daily except for 1/2 a tablet on Wednesday and Saturday. Recheck INR in 1 week by home health. [facility] Clinic [phone].

## 2020-09-02 ENCOUNTER — Telehealth: Payer: Self-pay | Admitting: *Deleted

## 2020-09-02 LAB — CULTURE, BLOOD (SINGLE): Culture: NO GROWTH

## 2020-09-02 NOTE — Telephone Encounter (Signed)
Post ED Visit - Positive Culture Follow-up  Culture report reviewed by antimicrobial stewardship pharmacist: [place] Team '[]'$  [address], Pharm.D. '[]'$  Heide Guile, Pharm.D., BCPS AQ-ID '[]'$  Parks Neptune, Pharm.D., BCPS '[]'$  Alycia Rossetti, Pharm.D., BCPS '[]'$  [place], Pharm.D., BCPS, AAHIVP '[]'$  Legrand Como, Pharm.D., BCPS, AAHIVP '[]'$  Salome Arnt, PharmD, BCPS '[]'$  Johnnette Gourd, PharmD, BCPS '[]'$  Hughes Better, PharmD, BCPS '[]'$  Leeroy Cha, PharmD '[]'$  Laqueta Linden, PharmD, BCPS '[]'$  Albertina Parr, PharmD  [place] Team '[]'$  Leodis Sias, PharmD '[]'$  Lindell Spar, PharmD '[]'$  Royetta Asal, PharmD '[]'$  Graylin Shiver, Rph '[]'$  Rema Fendt) Glennon Mac, PharmD '[]'$  Arlyn Dunning, PharmD '[]'$  Netta Cedars, PharmD '[]'$  Dia Sitter, PharmD '[]'$  Leone Haven, PharmD '[]'$  Gretta Arab, PharmD '[]'$  Theodis Shove, PharmD '[]'$  Peggyann Juba, PharmD '[]'$  Reuel Boom, PharmD   Positive urine culture ESBL phrenic abscess x 12 weeks IV therapy and no further patient follow-up is required at this time.  Harlon Flor [facility] 09/02/2020, 12:54 PM

## 2020-09-06 DIAGNOSIS — N119 Chronic tubulo-interstitial nephritis, unspecified: Secondary | ICD-10-CM | POA: Diagnosis not present

## 2020-09-06 DIAGNOSIS — I130 Hypertensive heart and chronic kidney disease with heart failure and stage 1 through stage 4 chronic kidney disease, or unspecified chronic kidney disease: Secondary | ICD-10-CM | POA: Diagnosis not present

## 2020-09-06 DIAGNOSIS — N200 Calculus of kidney: Secondary | ICD-10-CM | POA: Diagnosis not present

## 2020-09-06 DIAGNOSIS — I4821 Permanent atrial fibrillation: Secondary | ICD-10-CM | POA: Diagnosis not present

## 2020-09-06 DIAGNOSIS — K611 Rectal abscess: Secondary | ICD-10-CM | POA: Diagnosis not present

## 2020-09-06 DIAGNOSIS — I2510 Atherosclerotic heart disease of native coronary artery without angina pectoris: Secondary | ICD-10-CM | POA: Diagnosis not present

## 2020-09-07 ENCOUNTER — Telehealth: Payer: Self-pay

## 2020-09-07 NOTE — Telephone Encounter (Signed)
Spoke with patient's wife [name] and scheduled an in-person Palliative Consult for 09/14/20 @ 8:30AM with Dr. Hollace Kinnier. Documentation will be noted in [place].   COVID screening was negative. One dog in the home will put away before provider arrives. Patient lives with wife [name].   Consent obtained; updated Outlook/Netsmart/Team List and Epic.   Family is aware they may be receiving a call from provider the day before or day of to confirm appointment.

## 2020-09-07 NOTE — Telephone Encounter (Signed)
Spoke with patient regarding scheduling a Palliative consult. He requested I call his wife [name]. I called her mobile number and the call was dropped. Will try to can back later.

## 2020-09-08 ENCOUNTER — Ambulatory Visit (INDEPENDENT_AMBULATORY_CARE_PROVIDER_SITE_OTHER): Payer: Medicare Other | Admitting: *Deleted

## 2020-09-08 DIAGNOSIS — Z5181 Encounter for therapeutic drug level monitoring: Secondary | ICD-10-CM

## 2020-09-08 DIAGNOSIS — I130 Hypertensive heart and chronic kidney disease with heart failure and stage 1 through stage 4 chronic kidney disease, or unspecified chronic kidney disease: Secondary | ICD-10-CM | POA: Diagnosis not present

## 2020-09-08 DIAGNOSIS — I2510 Atherosclerotic heart disease of native coronary artery without angina pectoris: Secondary | ICD-10-CM | POA: Diagnosis not present

## 2020-09-08 DIAGNOSIS — N200 Calculus of kidney: Secondary | ICD-10-CM | POA: Diagnosis not present

## 2020-09-08 DIAGNOSIS — Z86718 Personal history of other venous thrombosis and embolism: Secondary | ICD-10-CM

## 2020-09-08 DIAGNOSIS — I4821 Permanent atrial fibrillation: Secondary | ICD-10-CM | POA: Diagnosis not present

## 2020-09-08 DIAGNOSIS — N119 Chronic tubulo-interstitial nephritis, unspecified: Secondary | ICD-10-CM | POA: Diagnosis not present

## 2020-09-08 DIAGNOSIS — I4819 Other persistent atrial fibrillation: Secondary | ICD-10-CM

## 2020-09-08 DIAGNOSIS — K611 Rectal abscess: Secondary | ICD-10-CM | POA: Diagnosis not present

## 2020-09-08 LAB — POCT INR: INR: 2 (ref 2.0–3.0)

## 2020-09-08 NOTE — Patient Instructions (Signed)
Description   Spoke to Louie Casa from [facility] while in patient's home, instructed for pt to take 1 tablet of warfarin today and then continue taking 1 tablet of warfarin daily except for 1/2 tablet on Wednesday and Saturday. Recheck INR in 1 week by Home Health. [facility] [phone].

## 2020-09-14 ENCOUNTER — Ambulatory Visit (INDEPENDENT_AMBULATORY_CARE_PROVIDER_SITE_OTHER): Payer: Medicare Other

## 2020-09-14 DIAGNOSIS — I442 Atrioventricular block, complete: Secondary | ICD-10-CM | POA: Diagnosis not present

## 2020-09-14 DIAGNOSIS — Z515 Encounter for palliative care: Secondary | ICD-10-CM | POA: Diagnosis not present

## 2020-09-14 DIAGNOSIS — N151 Renal and perinephric abscess: Secondary | ICD-10-CM | POA: Diagnosis not present

## 2020-09-14 DIAGNOSIS — M6281 Muscle weakness (generalized): Secondary | ICD-10-CM | POA: Diagnosis not present

## 2020-09-14 LAB — CUP PACEART REMOTE DEVICE CHECK
Battery Remaining Longevity: 29 mo
Battery Remaining Percentage: 42 %
Battery Voltage: 2.95 V
Brady Statistic AP VP Percent: 0 %
Brady Statistic AP VS Percent: 0 %
Brady Statistic AS VP Percent: 100 %
Brady Statistic AS VS Percent: 0 %
Brady Statistic RA Percent Paced: 1 %
Date Time Interrogation Session: 20220809025051
Implantable Lead Implant Date: 20190206
Implantable Lead Implant Date: 20190206
Implantable Lead Implant Date: 20190206
Implantable Lead Location: 753858
Implantable Lead Location: 753859
Implantable Lead Location: 753860
Implantable Pulse Generator Implant Date: 20190206
Lead Channel Impedance Value: 360 Ohm
Lead Channel Impedance Value: 400 Ohm
Lead Channel Impedance Value: 660 Ohm
Lead Channel Pacing Threshold Amplitude: 0.375 V
Lead Channel Pacing Threshold Amplitude: 0.875 V
Lead Channel Pacing Threshold Amplitude: 2.5 V
Lead Channel Pacing Threshold Pulse Width: 0.4 ms
Lead Channel Pacing Threshold Pulse Width: 0.5 ms
Lead Channel Pacing Threshold Pulse Width: 0.8 ms
Lead Channel Sensing Intrinsic Amplitude: 2.7 mV
Lead Channel Sensing Intrinsic Amplitude: 9.7 mV
Lead Channel Setting Pacing Amplitude: 1.375
Lead Channel Setting Pacing Amplitude: 2 V
Lead Channel Setting Pacing Amplitude: 3.25 V
Lead Channel Setting Pacing Pulse Width: 0.4 ms
Lead Channel Setting Pacing Pulse Width: 0.8 ms
Lead Channel Setting Sensing Sensitivity: 2 mV
Pulse Gen Model: 3262
Pulse Gen Serial Number: 8995812

## 2020-09-15 ENCOUNTER — Ambulatory Visit (INDEPENDENT_AMBULATORY_CARE_PROVIDER_SITE_OTHER): Payer: Medicare Other

## 2020-09-15 DIAGNOSIS — K611 Rectal abscess: Secondary | ICD-10-CM | POA: Diagnosis not present

## 2020-09-15 DIAGNOSIS — I4821 Permanent atrial fibrillation: Secondary | ICD-10-CM | POA: Diagnosis not present

## 2020-09-15 DIAGNOSIS — N200 Calculus of kidney: Secondary | ICD-10-CM | POA: Diagnosis not present

## 2020-09-15 DIAGNOSIS — N119 Chronic tubulo-interstitial nephritis, unspecified: Secondary | ICD-10-CM | POA: Diagnosis not present

## 2020-09-15 DIAGNOSIS — I130 Hypertensive heart and chronic kidney disease with heart failure and stage 1 through stage 4 chronic kidney disease, or unspecified chronic kidney disease: Secondary | ICD-10-CM | POA: Diagnosis not present

## 2020-09-15 DIAGNOSIS — Z86718 Personal history of other venous thrombosis and embolism: Secondary | ICD-10-CM

## 2020-09-15 DIAGNOSIS — I4819 Other persistent atrial fibrillation: Secondary | ICD-10-CM | POA: Diagnosis not present

## 2020-09-15 DIAGNOSIS — I2510 Atherosclerotic heart disease of native coronary artery without angina pectoris: Secondary | ICD-10-CM | POA: Diagnosis not present

## 2020-09-15 LAB — POCT INR: INR: 1.9 — AB (ref 2.0–3.0)

## 2020-09-15 NOTE — Patient Instructions (Signed)
Description   Spoke to Louie Casa from [facility] while in patient's home, instructed for pt to take 1.5 tablets of warfarin today and then start taking 1 tablet of warfarin daily except for 1/2 tablet on Saturdays. Recheck INR in 1 week by Home Health. [facility] [phone].

## 2020-09-20 DIAGNOSIS — N200 Calculus of kidney: Secondary | ICD-10-CM | POA: Diagnosis not present

## 2020-09-20 DIAGNOSIS — K611 Rectal abscess: Secondary | ICD-10-CM | POA: Diagnosis not present

## 2020-09-20 DIAGNOSIS — I4821 Permanent atrial fibrillation: Secondary | ICD-10-CM | POA: Diagnosis not present

## 2020-09-20 DIAGNOSIS — N119 Chronic tubulo-interstitial nephritis, unspecified: Secondary | ICD-10-CM | POA: Diagnosis not present

## 2020-09-20 DIAGNOSIS — I130 Hypertensive heart and chronic kidney disease with heart failure and stage 1 through stage 4 chronic kidney disease, or unspecified chronic kidney disease: Secondary | ICD-10-CM | POA: Diagnosis not present

## 2020-09-20 DIAGNOSIS — I2510 Atherosclerotic heart disease of native coronary artery without angina pectoris: Secondary | ICD-10-CM | POA: Diagnosis not present

## 2020-09-22 ENCOUNTER — Ambulatory Visit (INDEPENDENT_AMBULATORY_CARE_PROVIDER_SITE_OTHER): Payer: Medicare Other

## 2020-09-22 DIAGNOSIS — N119 Chronic tubulo-interstitial nephritis, unspecified: Secondary | ICD-10-CM | POA: Diagnosis not present

## 2020-09-22 DIAGNOSIS — I130 Hypertensive heart and chronic kidney disease with heart failure and stage 1 through stage 4 chronic kidney disease, or unspecified chronic kidney disease: Secondary | ICD-10-CM | POA: Diagnosis not present

## 2020-09-22 DIAGNOSIS — I2510 Atherosclerotic heart disease of native coronary artery without angina pectoris: Secondary | ICD-10-CM | POA: Diagnosis not present

## 2020-09-22 DIAGNOSIS — K611 Rectal abscess: Secondary | ICD-10-CM | POA: Diagnosis not present

## 2020-09-22 DIAGNOSIS — Z5181 Encounter for therapeutic drug level monitoring: Secondary | ICD-10-CM

## 2020-09-22 DIAGNOSIS — N200 Calculus of kidney: Secondary | ICD-10-CM | POA: Diagnosis not present

## 2020-09-22 DIAGNOSIS — I4821 Permanent atrial fibrillation: Secondary | ICD-10-CM | POA: Diagnosis not present

## 2020-09-22 DIAGNOSIS — I4819 Other persistent atrial fibrillation: Secondary | ICD-10-CM

## 2020-09-22 LAB — POCT INR: INR: 1.7 — AB (ref 2.0–3.0)

## 2020-09-22 NOTE — Patient Instructions (Signed)
Description   Spoke to Katharine Look from [facility] while in patient's home, instructed for pt to take 1.5 tablets of warfarin today and 1.5 tablets tomorrow and then continue taking 1 tablet of warfarin daily except for 1/2 tablet on Saturdays. Recheck INR in 1 week by Home Health. [facility] [phone].

## 2020-09-25 DIAGNOSIS — I5042 Chronic combined systolic (congestive) and diastolic (congestive) heart failure: Secondary | ICD-10-CM | POA: Diagnosis not present

## 2020-09-25 DIAGNOSIS — E669 Obesity, unspecified: Secondary | ICD-10-CM | POA: Diagnosis not present

## 2020-09-25 DIAGNOSIS — R32 Unspecified urinary incontinence: Secondary | ICD-10-CM | POA: Diagnosis not present

## 2020-09-25 DIAGNOSIS — K219 Gastro-esophageal reflux disease without esophagitis: Secondary | ICD-10-CM | POA: Diagnosis not present

## 2020-09-25 DIAGNOSIS — I459 Conduction disorder, unspecified: Secondary | ICD-10-CM | POA: Diagnosis not present

## 2020-09-25 DIAGNOSIS — J4540 Moderate persistent asthma, uncomplicated: Secondary | ICD-10-CM | POA: Diagnosis not present

## 2020-09-25 DIAGNOSIS — N119 Chronic tubulo-interstitial nephritis, unspecified: Secondary | ICD-10-CM | POA: Diagnosis not present

## 2020-09-25 DIAGNOSIS — N1832 Chronic kidney disease, stage 3b: Secondary | ICD-10-CM | POA: Diagnosis not present

## 2020-09-25 DIAGNOSIS — I130 Hypertensive heart and chronic kidney disease with heart failure and stage 1 through stage 4 chronic kidney disease, or unspecified chronic kidney disease: Secondary | ICD-10-CM | POA: Diagnosis not present

## 2020-09-25 DIAGNOSIS — Z86718 Personal history of other venous thrombosis and embolism: Secondary | ICD-10-CM | POA: Diagnosis not present

## 2020-09-25 DIAGNOSIS — G4733 Obstructive sleep apnea (adult) (pediatric): Secondary | ICD-10-CM | POA: Diagnosis not present

## 2020-09-25 DIAGNOSIS — Z86711 Personal history of pulmonary embolism: Secondary | ICD-10-CM | POA: Diagnosis not present

## 2020-09-25 DIAGNOSIS — Z8546 Personal history of malignant neoplasm of prostate: Secondary | ICD-10-CM | POA: Diagnosis not present

## 2020-09-25 DIAGNOSIS — Z9079 Acquired absence of other genital organ(s): Secondary | ICD-10-CM | POA: Diagnosis not present

## 2020-09-25 DIAGNOSIS — I2510 Atherosclerotic heart disease of native coronary artery without angina pectoris: Secondary | ICD-10-CM | POA: Diagnosis not present

## 2020-09-25 DIAGNOSIS — I252 Old myocardial infarction: Secondary | ICD-10-CM | POA: Diagnosis not present

## 2020-09-25 DIAGNOSIS — H9113 Presbycusis, bilateral: Secondary | ICD-10-CM | POA: Diagnosis not present

## 2020-09-25 DIAGNOSIS — Z6833 Body mass index (BMI) 33.0-33.9, adult: Secondary | ICD-10-CM | POA: Diagnosis not present

## 2020-09-25 DIAGNOSIS — K5900 Constipation, unspecified: Secondary | ICD-10-CM | POA: Diagnosis not present

## 2020-09-25 DIAGNOSIS — Z9181 History of falling: Secondary | ICD-10-CM | POA: Diagnosis not present

## 2020-09-25 DIAGNOSIS — E782 Mixed hyperlipidemia: Secondary | ICD-10-CM | POA: Diagnosis not present

## 2020-09-25 DIAGNOSIS — Z7901 Long term (current) use of anticoagulants: Secondary | ICD-10-CM | POA: Diagnosis not present

## 2020-09-25 DIAGNOSIS — M150 Primary generalized (osteo)arthritis: Secondary | ICD-10-CM | POA: Diagnosis not present

## 2020-09-25 DIAGNOSIS — I4821 Permanent atrial fibrillation: Secondary | ICD-10-CM | POA: Diagnosis not present

## 2020-09-25 DIAGNOSIS — Z950 Presence of cardiac pacemaker: Secondary | ICD-10-CM | POA: Diagnosis not present

## 2020-09-28 DIAGNOSIS — K219 Gastro-esophageal reflux disease without esophagitis: Secondary | ICD-10-CM | POA: Diagnosis not present

## 2020-09-28 DIAGNOSIS — I5042 Chronic combined systolic (congestive) and diastolic (congestive) heart failure: Secondary | ICD-10-CM | POA: Diagnosis not present

## 2020-09-28 DIAGNOSIS — H9113 Presbycusis, bilateral: Secondary | ICD-10-CM | POA: Diagnosis not present

## 2020-09-28 DIAGNOSIS — N119 Chronic tubulo-interstitial nephritis, unspecified: Secondary | ICD-10-CM | POA: Diagnosis not present

## 2020-09-28 DIAGNOSIS — I4821 Permanent atrial fibrillation: Secondary | ICD-10-CM | POA: Diagnosis not present

## 2020-09-28 DIAGNOSIS — I130 Hypertensive heart and chronic kidney disease with heart failure and stage 1 through stage 4 chronic kidney disease, or unspecified chronic kidney disease: Secondary | ICD-10-CM | POA: Diagnosis not present

## 2020-09-29 ENCOUNTER — Ambulatory Visit (INDEPENDENT_AMBULATORY_CARE_PROVIDER_SITE_OTHER): Payer: Medicare Other

## 2020-09-29 DIAGNOSIS — H9113 Presbycusis, bilateral: Secondary | ICD-10-CM | POA: Diagnosis not present

## 2020-09-29 DIAGNOSIS — I4819 Other persistent atrial fibrillation: Secondary | ICD-10-CM | POA: Diagnosis not present

## 2020-09-29 DIAGNOSIS — Z86718 Personal history of other venous thrombosis and embolism: Secondary | ICD-10-CM

## 2020-09-29 DIAGNOSIS — K219 Gastro-esophageal reflux disease without esophagitis: Secondary | ICD-10-CM | POA: Diagnosis not present

## 2020-09-29 DIAGNOSIS — N119 Chronic tubulo-interstitial nephritis, unspecified: Secondary | ICD-10-CM | POA: Diagnosis not present

## 2020-09-29 DIAGNOSIS — I4821 Permanent atrial fibrillation: Secondary | ICD-10-CM | POA: Diagnosis not present

## 2020-09-29 DIAGNOSIS — I130 Hypertensive heart and chronic kidney disease with heart failure and stage 1 through stage 4 chronic kidney disease, or unspecified chronic kidney disease: Secondary | ICD-10-CM | POA: Diagnosis not present

## 2020-09-29 DIAGNOSIS — I5042 Chronic combined systolic (congestive) and diastolic (congestive) heart failure: Secondary | ICD-10-CM | POA: Diagnosis not present

## 2020-09-29 LAB — POCT INR: INR: 2 (ref 2.0–3.0)

## 2020-09-29 NOTE — Patient Instructions (Signed)
Description   Spoke to [name] from [facility] while in patient's home, instructed to have pt start taking 1 tablet of warfarin daily.  Recheck INR in 1 week by Home Health. Coumad[facility] Clinic [phone].

## 2020-10-01 DIAGNOSIS — H11122 Conjunctival concretions, left eye: Secondary | ICD-10-CM | POA: Diagnosis not present

## 2020-10-05 ENCOUNTER — Telehealth: Payer: Self-pay

## 2020-10-05 DIAGNOSIS — I130 Hypertensive heart and chronic kidney disease with heart failure and stage 1 through stage 4 chronic kidney disease, or unspecified chronic kidney disease: Secondary | ICD-10-CM | POA: Diagnosis not present

## 2020-10-05 DIAGNOSIS — K219 Gastro-esophageal reflux disease without esophagitis: Secondary | ICD-10-CM | POA: Diagnosis not present

## 2020-10-05 DIAGNOSIS — N119 Chronic tubulo-interstitial nephritis, unspecified: Secondary | ICD-10-CM | POA: Diagnosis not present

## 2020-10-05 DIAGNOSIS — I4821 Permanent atrial fibrillation: Secondary | ICD-10-CM | POA: Diagnosis not present

## 2020-10-05 DIAGNOSIS — H9113 Presbycusis, bilateral: Secondary | ICD-10-CM | POA: Diagnosis not present

## 2020-10-05 DIAGNOSIS — I5042 Chronic combined systolic (congestive) and diastolic (congestive) heart failure: Secondary | ICD-10-CM | POA: Diagnosis not present

## 2020-10-05 NOTE — Telephone Encounter (Signed)
Call placed to patient's wife to provide update from Dr. Mariea Clonts.

## 2020-10-06 ENCOUNTER — Ambulatory Visit (INDEPENDENT_AMBULATORY_CARE_PROVIDER_SITE_OTHER): Payer: Medicare Other

## 2020-10-06 DIAGNOSIS — K219 Gastro-esophageal reflux disease without esophagitis: Secondary | ICD-10-CM | POA: Diagnosis not present

## 2020-10-06 DIAGNOSIS — N119 Chronic tubulo-interstitial nephritis, unspecified: Secondary | ICD-10-CM | POA: Diagnosis not present

## 2020-10-06 DIAGNOSIS — H9113 Presbycusis, bilateral: Secondary | ICD-10-CM | POA: Diagnosis not present

## 2020-10-06 DIAGNOSIS — Z86718 Personal history of other venous thrombosis and embolism: Secondary | ICD-10-CM | POA: Diagnosis not present

## 2020-10-06 DIAGNOSIS — I4819 Other persistent atrial fibrillation: Secondary | ICD-10-CM

## 2020-10-06 DIAGNOSIS — I130 Hypertensive heart and chronic kidney disease with heart failure and stage 1 through stage 4 chronic kidney disease, or unspecified chronic kidney disease: Secondary | ICD-10-CM | POA: Diagnosis not present

## 2020-10-06 DIAGNOSIS — I4821 Permanent atrial fibrillation: Secondary | ICD-10-CM | POA: Diagnosis not present

## 2020-10-06 DIAGNOSIS — I5042 Chronic combined systolic (congestive) and diastolic (congestive) heart failure: Secondary | ICD-10-CM | POA: Diagnosis not present

## 2020-10-06 LAB — POCT INR: INR: 2.6 (ref 2.0–3.0)

## 2020-10-06 NOTE — Patient Instructions (Signed)
Description   Spoke to [facility] from [facility] while in patient's home, instructed to have pt continue on same dosage 1 tablet of warfarin daily.  Recheck INR in 1 week by Home Health. Coumadin Clinic [phone].

## 2020-10-07 ENCOUNTER — Telehealth: Payer: Self-pay

## 2020-10-07 NOTE — Progress Notes (Signed)
Remote pacemaker transmission.   

## 2020-10-07 NOTE — Telephone Encounter (Signed)
Late entry for 10/06/20: Returned call to patient's wife, [name]. [name] reported that patient's diarrhea returned this morning but he only had 2 episodes in the morning and nothing further. Patient offers no complaints of not feeling well, denies any nausea or vomiting. No fever. Appetite is good and patient continues to take in adequate fluids. Patient has not taken any imodium or lomotil. [name] continues to apply Desitin to patient's excoriated buttocks with much improvement. Shared with [name] that Dr. Mariea Clonts feels that patient may need to have a stool culture and specimen for c-diff if diarrhea persists. This RN placed call to [facility] to inquire if specimen could be collected in a generic container and have patient's wife drop off specimen. Awaiting return call.

## 2020-10-12 DIAGNOSIS — I130 Hypertensive heart and chronic kidney disease with heart failure and stage 1 through stage 4 chronic kidney disease, or unspecified chronic kidney disease: Secondary | ICD-10-CM | POA: Diagnosis not present

## 2020-10-12 DIAGNOSIS — K219 Gastro-esophageal reflux disease without esophagitis: Secondary | ICD-10-CM | POA: Diagnosis not present

## 2020-10-12 DIAGNOSIS — R197 Diarrhea, unspecified: Secondary | ICD-10-CM | POA: Diagnosis not present

## 2020-10-12 DIAGNOSIS — H9113 Presbycusis, bilateral: Secondary | ICD-10-CM | POA: Diagnosis not present

## 2020-10-12 DIAGNOSIS — N119 Chronic tubulo-interstitial nephritis, unspecified: Secondary | ICD-10-CM | POA: Diagnosis not present

## 2020-10-12 DIAGNOSIS — Z515 Encounter for palliative care: Secondary | ICD-10-CM | POA: Diagnosis not present

## 2020-10-12 DIAGNOSIS — I5042 Chronic combined systolic (congestive) and diastolic (congestive) heart failure: Secondary | ICD-10-CM | POA: Diagnosis not present

## 2020-10-12 DIAGNOSIS — R54 Age-related physical debility: Secondary | ICD-10-CM | POA: Diagnosis not present

## 2020-10-12 DIAGNOSIS — I4821 Permanent atrial fibrillation: Secondary | ICD-10-CM | POA: Diagnosis not present

## 2020-10-13 ENCOUNTER — Other Ambulatory Visit: Payer: Self-pay | Admitting: Cardiology

## 2020-10-13 DIAGNOSIS — K219 Gastro-esophageal reflux disease without esophagitis: Secondary | ICD-10-CM | POA: Diagnosis not present

## 2020-10-13 DIAGNOSIS — I5042 Chronic combined systolic (congestive) and diastolic (congestive) heart failure: Secondary | ICD-10-CM | POA: Diagnosis not present

## 2020-10-13 DIAGNOSIS — H9113 Presbycusis, bilateral: Secondary | ICD-10-CM | POA: Diagnosis not present

## 2020-10-13 DIAGNOSIS — I130 Hypertensive heart and chronic kidney disease with heart failure and stage 1 through stage 4 chronic kidney disease, or unspecified chronic kidney disease: Secondary | ICD-10-CM | POA: Diagnosis not present

## 2020-10-13 DIAGNOSIS — N119 Chronic tubulo-interstitial nephritis, unspecified: Secondary | ICD-10-CM | POA: Diagnosis not present

## 2020-10-13 DIAGNOSIS — I4821 Permanent atrial fibrillation: Secondary | ICD-10-CM | POA: Diagnosis not present

## 2020-10-13 NOTE — Telephone Encounter (Signed)
Pravastatin 20 mg # 90 x 3 refills  Sent to  [id number] [place], [place]

## 2020-10-18 DIAGNOSIS — I5042 Chronic combined systolic (congestive) and diastolic (congestive) heart failure: Secondary | ICD-10-CM | POA: Diagnosis not present

## 2020-10-18 DIAGNOSIS — K219 Gastro-esophageal reflux disease without esophagitis: Secondary | ICD-10-CM | POA: Diagnosis not present

## 2020-10-18 DIAGNOSIS — H9113 Presbycusis, bilateral: Secondary | ICD-10-CM | POA: Diagnosis not present

## 2020-10-18 DIAGNOSIS — N119 Chronic tubulo-interstitial nephritis, unspecified: Secondary | ICD-10-CM | POA: Diagnosis not present

## 2020-10-18 DIAGNOSIS — I130 Hypertensive heart and chronic kidney disease with heart failure and stage 1 through stage 4 chronic kidney disease, or unspecified chronic kidney disease: Secondary | ICD-10-CM | POA: Diagnosis not present

## 2020-10-18 DIAGNOSIS — I4821 Permanent atrial fibrillation: Secondary | ICD-10-CM | POA: Diagnosis not present

## 2020-10-20 ENCOUNTER — Ambulatory Visit (INDEPENDENT_AMBULATORY_CARE_PROVIDER_SITE_OTHER): Payer: Medicare Other

## 2020-10-20 DIAGNOSIS — I4819 Other persistent atrial fibrillation: Secondary | ICD-10-CM

## 2020-10-20 DIAGNOSIS — H9113 Presbycusis, bilateral: Secondary | ICD-10-CM | POA: Diagnosis not present

## 2020-10-20 DIAGNOSIS — I4821 Permanent atrial fibrillation: Secondary | ICD-10-CM | POA: Diagnosis not present

## 2020-10-20 DIAGNOSIS — Z86718 Personal history of other venous thrombosis and embolism: Secondary | ICD-10-CM

## 2020-10-20 DIAGNOSIS — I130 Hypertensive heart and chronic kidney disease with heart failure and stage 1 through stage 4 chronic kidney disease, or unspecified chronic kidney disease: Secondary | ICD-10-CM | POA: Diagnosis not present

## 2020-10-20 DIAGNOSIS — I5042 Chronic combined systolic (congestive) and diastolic (congestive) heart failure: Secondary | ICD-10-CM | POA: Diagnosis not present

## 2020-10-20 DIAGNOSIS — K219 Gastro-esophageal reflux disease without esophagitis: Secondary | ICD-10-CM | POA: Diagnosis not present

## 2020-10-20 DIAGNOSIS — N119 Chronic tubulo-interstitial nephritis, unspecified: Secondary | ICD-10-CM | POA: Diagnosis not present

## 2020-10-20 LAB — POCT INR: INR: 3.5 — AB (ref 2.0–3.0)

## 2020-10-20 NOTE — Patient Instructions (Signed)
Description   Spoke to [name], pt's wife instructed to have pt skip today's dosage of Warfarin, then resume same dosage 1 tablet of warfarin daily.  Recheck INR in 1 week by Home Health. [name] stated Floyde Parkins [facility] RN's phone was messed up and being repaired, he is going to call her back and get dosage instructions and redraw orders from her.  Coumadin Clinic [phone].

## 2020-10-22 DIAGNOSIS — H903 Sensorineural hearing loss, bilateral: Secondary | ICD-10-CM | POA: Diagnosis not present

## 2020-10-25 DIAGNOSIS — K219 Gastro-esophageal reflux disease without esophagitis: Secondary | ICD-10-CM | POA: Diagnosis not present

## 2020-10-25 DIAGNOSIS — Z86718 Personal history of other venous thrombosis and embolism: Secondary | ICD-10-CM | POA: Diagnosis not present

## 2020-10-25 DIAGNOSIS — Z9079 Acquired absence of other genital organ(s): Secondary | ICD-10-CM | POA: Diagnosis not present

## 2020-10-25 DIAGNOSIS — E782 Mixed hyperlipidemia: Secondary | ICD-10-CM | POA: Diagnosis not present

## 2020-10-25 DIAGNOSIS — I4821 Permanent atrial fibrillation: Secondary | ICD-10-CM | POA: Diagnosis not present

## 2020-10-25 DIAGNOSIS — I130 Hypertensive heart and chronic kidney disease with heart failure and stage 1 through stage 4 chronic kidney disease, or unspecified chronic kidney disease: Secondary | ICD-10-CM | POA: Diagnosis not present

## 2020-10-25 DIAGNOSIS — Z8546 Personal history of malignant neoplasm of prostate: Secondary | ICD-10-CM | POA: Diagnosis not present

## 2020-10-25 DIAGNOSIS — I5042 Chronic combined systolic (congestive) and diastolic (congestive) heart failure: Secondary | ICD-10-CM | POA: Diagnosis not present

## 2020-10-25 DIAGNOSIS — Z9181 History of falling: Secondary | ICD-10-CM | POA: Diagnosis not present

## 2020-10-25 DIAGNOSIS — Z7901 Long term (current) use of anticoagulants: Secondary | ICD-10-CM | POA: Diagnosis not present

## 2020-10-25 DIAGNOSIS — N119 Chronic tubulo-interstitial nephritis, unspecified: Secondary | ICD-10-CM | POA: Diagnosis not present

## 2020-10-25 DIAGNOSIS — K5900 Constipation, unspecified: Secondary | ICD-10-CM | POA: Diagnosis not present

## 2020-10-25 DIAGNOSIS — I252 Old myocardial infarction: Secondary | ICD-10-CM | POA: Diagnosis not present

## 2020-10-25 DIAGNOSIS — I459 Conduction disorder, unspecified: Secondary | ICD-10-CM | POA: Diagnosis not present

## 2020-10-25 DIAGNOSIS — H9113 Presbycusis, bilateral: Secondary | ICD-10-CM | POA: Diagnosis not present

## 2020-10-25 DIAGNOSIS — I2510 Atherosclerotic heart disease of native coronary artery without angina pectoris: Secondary | ICD-10-CM | POA: Diagnosis not present

## 2020-10-25 DIAGNOSIS — G4733 Obstructive sleep apnea (adult) (pediatric): Secondary | ICD-10-CM | POA: Diagnosis not present

## 2020-10-25 DIAGNOSIS — Z950 Presence of cardiac pacemaker: Secondary | ICD-10-CM | POA: Diagnosis not present

## 2020-10-25 DIAGNOSIS — Z6833 Body mass index (BMI) 33.0-33.9, adult: Secondary | ICD-10-CM | POA: Diagnosis not present

## 2020-10-25 DIAGNOSIS — N1832 Chronic kidney disease, stage 3b: Secondary | ICD-10-CM | POA: Diagnosis not present

## 2020-10-25 DIAGNOSIS — E669 Obesity, unspecified: Secondary | ICD-10-CM | POA: Diagnosis not present

## 2020-10-25 DIAGNOSIS — J4540 Moderate persistent asthma, uncomplicated: Secondary | ICD-10-CM | POA: Diagnosis not present

## 2020-10-25 DIAGNOSIS — R32 Unspecified urinary incontinence: Secondary | ICD-10-CM | POA: Diagnosis not present

## 2020-10-25 DIAGNOSIS — Z86711 Personal history of pulmonary embolism: Secondary | ICD-10-CM | POA: Diagnosis not present

## 2020-10-25 DIAGNOSIS — M150 Primary generalized (osteo)arthritis: Secondary | ICD-10-CM | POA: Diagnosis not present

## 2020-10-27 ENCOUNTER — Ambulatory Visit (INDEPENDENT_AMBULATORY_CARE_PROVIDER_SITE_OTHER): Payer: Medicare Other | Admitting: Internal Medicine

## 2020-10-27 DIAGNOSIS — I130 Hypertensive heart and chronic kidney disease with heart failure and stage 1 through stage 4 chronic kidney disease, or unspecified chronic kidney disease: Secondary | ICD-10-CM | POA: Diagnosis not present

## 2020-10-27 DIAGNOSIS — I4821 Permanent atrial fibrillation: Secondary | ICD-10-CM | POA: Diagnosis not present

## 2020-10-27 DIAGNOSIS — N119 Chronic tubulo-interstitial nephritis, unspecified: Secondary | ICD-10-CM | POA: Diagnosis not present

## 2020-10-27 DIAGNOSIS — Z5181 Encounter for therapeutic drug level monitoring: Secondary | ICD-10-CM

## 2020-10-27 DIAGNOSIS — H9113 Presbycusis, bilateral: Secondary | ICD-10-CM | POA: Diagnosis not present

## 2020-10-27 DIAGNOSIS — K219 Gastro-esophageal reflux disease without esophagitis: Secondary | ICD-10-CM | POA: Diagnosis not present

## 2020-10-27 DIAGNOSIS — I5042 Chronic combined systolic (congestive) and diastolic (congestive) heart failure: Secondary | ICD-10-CM | POA: Diagnosis not present

## 2020-10-27 LAB — POCT INR: INR: 2.2 (ref 2.0–3.0)

## 2020-10-27 NOTE — Patient Instructions (Signed)
Description   Called and spoke to [place] from [facility] while he was at pt's home, instructed pt to continue to take warfarin 1 tablet daily. Recheck INR in 1 week by homehealth. Coumadin Clinic [phone].

## 2020-10-29 DIAGNOSIS — K219 Gastro-esophageal reflux disease without esophagitis: Secondary | ICD-10-CM | POA: Diagnosis not present

## 2020-10-29 DIAGNOSIS — H9113 Presbycusis, bilateral: Secondary | ICD-10-CM | POA: Diagnosis not present

## 2020-10-29 DIAGNOSIS — I130 Hypertensive heart and chronic kidney disease with heart failure and stage 1 through stage 4 chronic kidney disease, or unspecified chronic kidney disease: Secondary | ICD-10-CM | POA: Diagnosis not present

## 2020-10-29 DIAGNOSIS — I5042 Chronic combined systolic (congestive) and diastolic (congestive) heart failure: Secondary | ICD-10-CM | POA: Diagnosis not present

## 2020-10-29 DIAGNOSIS — N119 Chronic tubulo-interstitial nephritis, unspecified: Secondary | ICD-10-CM | POA: Diagnosis not present

## 2020-10-29 DIAGNOSIS — I4821 Permanent atrial fibrillation: Secondary | ICD-10-CM | POA: Diagnosis not present

## 2020-11-02 DIAGNOSIS — I5042 Chronic combined systolic (congestive) and diastolic (congestive) heart failure: Secondary | ICD-10-CM | POA: Diagnosis not present

## 2020-11-02 DIAGNOSIS — I130 Hypertensive heart and chronic kidney disease with heart failure and stage 1 through stage 4 chronic kidney disease, or unspecified chronic kidney disease: Secondary | ICD-10-CM | POA: Diagnosis not present

## 2020-11-02 DIAGNOSIS — I4821 Permanent atrial fibrillation: Secondary | ICD-10-CM | POA: Diagnosis not present

## 2020-11-02 DIAGNOSIS — H9113 Presbycusis, bilateral: Secondary | ICD-10-CM | POA: Diagnosis not present

## 2020-11-02 DIAGNOSIS — K219 Gastro-esophageal reflux disease without esophagitis: Secondary | ICD-10-CM | POA: Diagnosis not present

## 2020-11-02 DIAGNOSIS — N119 Chronic tubulo-interstitial nephritis, unspecified: Secondary | ICD-10-CM | POA: Diagnosis not present

## 2020-11-03 ENCOUNTER — Ambulatory Visit (INDEPENDENT_AMBULATORY_CARE_PROVIDER_SITE_OTHER): Payer: Medicare Other

## 2020-11-03 DIAGNOSIS — I5042 Chronic combined systolic (congestive) and diastolic (congestive) heart failure: Secondary | ICD-10-CM | POA: Diagnosis not present

## 2020-11-03 DIAGNOSIS — N119 Chronic tubulo-interstitial nephritis, unspecified: Secondary | ICD-10-CM | POA: Diagnosis not present

## 2020-11-03 DIAGNOSIS — Z5181 Encounter for therapeutic drug level monitoring: Secondary | ICD-10-CM | POA: Diagnosis not present

## 2020-11-03 DIAGNOSIS — H9113 Presbycusis, bilateral: Secondary | ICD-10-CM | POA: Diagnosis not present

## 2020-11-03 DIAGNOSIS — I4821 Permanent atrial fibrillation: Secondary | ICD-10-CM | POA: Diagnosis not present

## 2020-11-03 DIAGNOSIS — K219 Gastro-esophageal reflux disease without esophagitis: Secondary | ICD-10-CM | POA: Diagnosis not present

## 2020-11-03 DIAGNOSIS — I130 Hypertensive heart and chronic kidney disease with heart failure and stage 1 through stage 4 chronic kidney disease, or unspecified chronic kidney disease: Secondary | ICD-10-CM | POA: Diagnosis not present

## 2020-11-03 LAB — POCT INR: INR: 2.1 (ref 2.0–3.0)

## 2020-11-03 NOTE — Patient Instructions (Signed)
Description   Called and spoke to [place] from [facility] while he was at pt's home, instructed pt to continue to take warfarin 1 tablet daily. Recheck INR in 1 week by homehealth. Cou[facility]in Clinic [phone].

## 2020-11-10 ENCOUNTER — Ambulatory Visit (INDEPENDENT_AMBULATORY_CARE_PROVIDER_SITE_OTHER): Payer: Medicare Other

## 2020-11-10 DIAGNOSIS — I4819 Other persistent atrial fibrillation: Secondary | ICD-10-CM | POA: Diagnosis not present

## 2020-11-10 DIAGNOSIS — Z5181 Encounter for therapeutic drug level monitoring: Secondary | ICD-10-CM

## 2020-11-10 DIAGNOSIS — H9113 Presbycusis, bilateral: Secondary | ICD-10-CM | POA: Diagnosis not present

## 2020-11-10 DIAGNOSIS — I130 Hypertensive heart and chronic kidney disease with heart failure and stage 1 through stage 4 chronic kidney disease, or unspecified chronic kidney disease: Secondary | ICD-10-CM | POA: Diagnosis not present

## 2020-11-10 DIAGNOSIS — N119 Chronic tubulo-interstitial nephritis, unspecified: Secondary | ICD-10-CM | POA: Diagnosis not present

## 2020-11-10 DIAGNOSIS — I5042 Chronic combined systolic (congestive) and diastolic (congestive) heart failure: Secondary | ICD-10-CM | POA: Diagnosis not present

## 2020-11-10 DIAGNOSIS — K219 Gastro-esophageal reflux disease without esophagitis: Secondary | ICD-10-CM | POA: Diagnosis not present

## 2020-11-10 DIAGNOSIS — I4821 Permanent atrial fibrillation: Secondary | ICD-10-CM | POA: Diagnosis not present

## 2020-11-10 LAB — POCT INR: INR: 2 (ref 2.0–3.0)

## 2020-11-10 NOTE — Patient Instructions (Signed)
Description   Called and spoke to [place] from [facility] while he was at pt's home, instructed pt to take 1.5 tablets today and then continue to take warfarin 1 tablet daily. Recheck INR in 1 week by homehealth. Coumadin Clinic [phone].

## 2020-11-11 DIAGNOSIS — Z515 Encounter for palliative care: Secondary | ICD-10-CM | POA: Diagnosis not present

## 2020-11-11 DIAGNOSIS — D6869 Other thrombophilia: Secondary | ICD-10-CM | POA: Diagnosis not present

## 2020-11-11 DIAGNOSIS — I4821 Permanent atrial fibrillation: Secondary | ICD-10-CM | POA: Diagnosis not present

## 2020-11-11 DIAGNOSIS — I130 Hypertensive heart and chronic kidney disease with heart failure and stage 1 through stage 4 chronic kidney disease, or unspecified chronic kidney disease: Secondary | ICD-10-CM | POA: Diagnosis not present

## 2020-11-11 DIAGNOSIS — R54 Age-related physical debility: Secondary | ICD-10-CM | POA: Diagnosis not present

## 2020-11-11 DIAGNOSIS — K219 Gastro-esophageal reflux disease without esophagitis: Secondary | ICD-10-CM | POA: Diagnosis not present

## 2020-11-11 DIAGNOSIS — N119 Chronic tubulo-interstitial nephritis, unspecified: Secondary | ICD-10-CM | POA: Diagnosis not present

## 2020-11-11 DIAGNOSIS — I5042 Chronic combined systolic (congestive) and diastolic (congestive) heart failure: Secondary | ICD-10-CM | POA: Diagnosis not present

## 2020-11-11 DIAGNOSIS — H9113 Presbycusis, bilateral: Secondary | ICD-10-CM | POA: Diagnosis not present

## 2020-11-15 DIAGNOSIS — H9113 Presbycusis, bilateral: Secondary | ICD-10-CM | POA: Diagnosis not present

## 2020-11-15 DIAGNOSIS — I5042 Chronic combined systolic (congestive) and diastolic (congestive) heart failure: Secondary | ICD-10-CM | POA: Diagnosis not present

## 2020-11-15 DIAGNOSIS — I4821 Permanent atrial fibrillation: Secondary | ICD-10-CM | POA: Diagnosis not present

## 2020-11-15 DIAGNOSIS — K219 Gastro-esophageal reflux disease without esophagitis: Secondary | ICD-10-CM | POA: Diagnosis not present

## 2020-11-15 DIAGNOSIS — N119 Chronic tubulo-interstitial nephritis, unspecified: Secondary | ICD-10-CM | POA: Diagnosis not present

## 2020-11-15 DIAGNOSIS — I130 Hypertensive heart and chronic kidney disease with heart failure and stage 1 through stage 4 chronic kidney disease, or unspecified chronic kidney disease: Secondary | ICD-10-CM | POA: Diagnosis not present

## 2020-11-17 ENCOUNTER — Ambulatory Visit (INDEPENDENT_AMBULATORY_CARE_PROVIDER_SITE_OTHER): Payer: Medicare Other | Admitting: *Deleted

## 2020-11-17 DIAGNOSIS — K219 Gastro-esophageal reflux disease without esophagitis: Secondary | ICD-10-CM | POA: Diagnosis not present

## 2020-11-17 DIAGNOSIS — I4821 Permanent atrial fibrillation: Secondary | ICD-10-CM | POA: Diagnosis not present

## 2020-11-17 DIAGNOSIS — Z86718 Personal history of other venous thrombosis and embolism: Secondary | ICD-10-CM

## 2020-11-17 DIAGNOSIS — H9113 Presbycusis, bilateral: Secondary | ICD-10-CM | POA: Diagnosis not present

## 2020-11-17 DIAGNOSIS — Z5181 Encounter for therapeutic drug level monitoring: Secondary | ICD-10-CM | POA: Diagnosis not present

## 2020-11-17 DIAGNOSIS — I4819 Other persistent atrial fibrillation: Secondary | ICD-10-CM

## 2020-11-17 DIAGNOSIS — I130 Hypertensive heart and chronic kidney disease with heart failure and stage 1 through stage 4 chronic kidney disease, or unspecified chronic kidney disease: Secondary | ICD-10-CM | POA: Diagnosis not present

## 2020-11-17 DIAGNOSIS — N119 Chronic tubulo-interstitial nephritis, unspecified: Secondary | ICD-10-CM | POA: Diagnosis not present

## 2020-11-17 DIAGNOSIS — I5042 Chronic combined systolic (congestive) and diastolic (congestive) heart failure: Secondary | ICD-10-CM | POA: Diagnosis not present

## 2020-11-17 LAB — POCT INR: INR: 1.8 — AB (ref 2.0–3.0)

## 2020-12-01 ENCOUNTER — Other Ambulatory Visit: Payer: Self-pay

## 2020-12-01 ENCOUNTER — Ambulatory Visit (INDEPENDENT_AMBULATORY_CARE_PROVIDER_SITE_OTHER): Payer: Medicare Other | Admitting: *Deleted

## 2020-12-01 DIAGNOSIS — I4819 Other persistent atrial fibrillation: Secondary | ICD-10-CM

## 2020-12-01 DIAGNOSIS — Z86718 Personal history of other venous thrombosis and embolism: Secondary | ICD-10-CM | POA: Diagnosis not present

## 2020-12-01 DIAGNOSIS — Z5181 Encounter for therapeutic drug level monitoring: Secondary | ICD-10-CM

## 2020-12-01 LAB — POCT INR: INR: 2.5 (ref 2.0–3.0)

## 2020-12-01 NOTE — Patient Instructions (Signed)
Description   Continue taking warfarin 1 tablet daily except 1.5 tablets on Wednesdays. Recheck INR in 3 weeks. Coumadin Clinic [phone].

## 2020-12-08 ENCOUNTER — Encounter: Payer: Self-pay | Admitting: Cardiology

## 2020-12-08 ENCOUNTER — Ambulatory Visit: Payer: Medicare Other | Admitting: Cardiology

## 2020-12-08 ENCOUNTER — Other Ambulatory Visit: Payer: Self-pay

## 2020-12-08 VITALS — BP 155/62 | HR 49 | Temp 97.1°F | Resp 14 | Ht 72.0 in | Wt 206.0 lb

## 2020-12-08 DIAGNOSIS — N1831 Chronic kidney disease, stage 3a: Secondary | ICD-10-CM | POA: Diagnosis not present

## 2020-12-08 DIAGNOSIS — I5042 Chronic combined systolic (congestive) and diastolic (congestive) heart failure: Secondary | ICD-10-CM | POA: Diagnosis not present

## 2020-12-08 DIAGNOSIS — I25118 Atherosclerotic heart disease of native coronary artery with other forms of angina pectoris: Secondary | ICD-10-CM | POA: Diagnosis not present

## 2020-12-08 DIAGNOSIS — Z952 Presence of prosthetic heart valve: Secondary | ICD-10-CM | POA: Diagnosis not present

## 2020-12-08 DIAGNOSIS — I4821 Permanent atrial fibrillation: Secondary | ICD-10-CM

## 2020-12-08 MED ORDER — CARVEDILOL 3.125 MG PO TABS: 3.1250 mg | ORAL_TABLET | Freq: Two times a day (BID) | ORAL | 3 refills | Status: AC

## 2020-12-08 MED ORDER — RIVAROXABAN 15 MG PO TABS: 15.0000 mg | ORAL_TABLET | Freq: Every day | ORAL | 3 refills | Status: AC

## 2020-12-08 NOTE — Progress Notes (Signed)
Primary Physician/Referring:  Gayland Curry, DO  Patient ID: [name], male    DOB: [birth year], 87 y.o.   MRN: [id number]  Chief Complaint  Patient presents with   Establish Care   Coronary Artery Disease   New Patient (Initial Visit)    HPI:    [name]  is a 87 y.o.  Caucasian male patient with coronary artery disease, history of TAVR, biventricular pacemaker implantation for Caucasian male patient with known coronary artery disease, ischemic cardiomyopathy SP biventricular cardiac pacemaker implantation, permanent atrial fibrillation, severe aortic stenosis SP TAVR on 03/19/2018, deconditioning, persistent E. coli diarrhea, history of perinephric abscess, chronic renal insufficiency, presents to establish care, presently in palliative care and wanted a second opinion.  He has recuperated extremely well and has not had any recent hospitalization in the past >6 months.  He has not had any further hospitalizations in July 2022, at that point he had refused any further antibiotic therapy for perinephric infection.  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  Today he has returned back to complete physical activity that he normally does at home without any limitations.  In fact home health has discharged him.    Past Medical History:  Diagnosis Date   Aortic valve stenosis 04/05/2015   Formatting of this note might be different from the original. Overview:  ECHO 5/15 Formatting of this note might be different from the original. Overview:  Overview:  ECHO 5/15   Arthritis    "knees, right shoulder" (03/14/2017)   Atrial fibrillation, persistent ([place]) 09/19/2014   CAD (coronary artery disease), native coronary artery    Cath 2011 LHC (08/2009):~ Proximal LAD 30%, mid to distal LAD 25%, ostial small D1 75% mid AV groove circumflex 99% been subtotal stenosis, proximal to mid RCA 25-30%, mid RCA 30%, mid PDA 30%, EF 50% with inferior  hypokinesis.;    July 2011  PCI and DES to circumflex Dr. Olevia Perches   ETT-Myoview (06/2013):  Inferolateral scar, mild peri-infarct ischemia, EF 40%; Intermediate Risk   ECHO EF 55% 03/2013    CAD (coronary artery disease), native coronary artery 04/05/2015   Cath 2011 LHC (08/2009):~ Proximal LAD 30%, mid to distal LAD 25%, ostial small D1 75% mid AV groove circumflex 99% been subtotal stenosis, proximal to mid RCA 25-30%, mid RCA 30%, mid PDA 30%, EF 50% with inferior hypokinesis.;    July 2011  PCI and DES to circumflex Dr. Olevia Perches   ETT-Myoview (06/2013):  Inferolateral scar, mild peri-infarct ischemia, EF 40%; Intermediate Risk   ECHO EF 55% 03/2013     Cardiac pacemaker in situ 03/14/2017   Biventricular St. Jude inserted 03/14/17 Dr. Lovena Le for second degree heart block    Chronic combined systolic and diastolic heart failure ([place]) 11/19/2017   CKD (chronic kidney disease), stage III ([place])    Gastro-esophageal reflux disease without esophagitis 09/24/2009   GERD    History of infection of prosthetic knee 04/05/2015   Treated with debridement and irrigation followed by 6 months of triple antibiotics   History of kidney stones    History of peptic ulcer 1970s?   History of prostate cancer    Radical prostatectomy in 2006 Dr. Terance Hart    Hypertensive heart disease    Internal hemorrhoids 09/11/2018   Long term (current) use of anticoagulants 10/06/2011   Mixed hyperlipidemia    Moderate persistent asthma without complication 2/35/5732   Noise effect on both inner ears  08/14/2018   Obesity (BMI 30-39.9)    OSA on CPAP    OSA treated with BiPAP 11/15/2015   Persistent atrial fibrillation ([place]) 09/19/2014   CHA2DS2VASC score 5  Cardioversion 10/08/2014 was on amiodarone until 2018    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)    Initial DVT in 2006 after prostate surgery and had [place] filter placed Bilateral  PE 2013 and placed back on warfarin DVT of right subclavian vein on  doppler 10/2012 at time of knee infection    Personal history of malignant neoplasm of prostate    Radical prostatectomy in 2006 Dr. Terance Hart     Presbycusis of both ears 08/14/2018   Severe aortic stenosis    Severe aortic stenosis 03/18/2018   Urinary incontinence    MULTIPLE BLADDER SURGERIES - STATES NO URINARY SPHINCTER - PT'S UROLOGIST IS AT [facility]- DR. PETERSON  ( LAST VISIT WAS 09/15/11 )   Past Surgical History:  Procedure Laterality Date   APPENDECTOMY     BI-VENTRICULAR PACEMAKER INSERTION (CRT-P)  03/14/2017   BIV PACEMAKER INSERTION CRT-P N/A 03/14/2017   Procedure: BIV PACEMAKER INSERTION CRT-P;  Surgeon: Evans Lance, MD;  Location: [facility];  Service: Cardiovascular;  Laterality: N/A;   CARDIOVERSION N/A 10/08/2014   Procedure: CARDIOVERSION;  Surgeon: Jacolyn Reedy, MD;  Location: [facility];  Service: Cardiovascular;  Laterality: N/A;   CATARACT EXTRACTION W/ INTRAOCULAR LENS  IMPLANT, BILATERAL Bilateral    CORONARY ANGIOPLASTY WITH STENT PLACEMENT  08/25/2009   DES-mid LCx 08/2009; 30% pLAD, 25% m/dLAD, 75% ostial D1, 99% mLCx s/p DES, 25-30% p/mRCA, 40% mRCA, 30% mPDA stenoses; LVEF 50%, inf hypokinesis   CORONARY STENT INTERVENTION N/A 03/18/2018   Procedure: CORONARY STENT INTERVENTION;  Surgeon: Sherren Mocha, MD;  Location: [facility];  Service: Cardiovascular;  Laterality: N/A;   EXCISIONAL HEMORRHOIDECTOMY     I & D KNEE WITH POLY EXCHANGE Left 10/09/2012   Procedure: IRRIGATION AND DEBRIDEMENT LEFT KNEE WITH POLY REVISION;  Surgeon: Gearlean Alf, MD;  Location: [facility];  Service: Orthopedics;  Laterality: Left;   INCISION AND DRAINAGE ABSCESS N/A 05/16/2020   Procedure: INCISION AND DRAINAGE  PERIRECTAL ABSCESS;  Surgeon: Rolm Bookbinder, MD;  Location: [facility];  Service: General;  Laterality: N/A;   INGUINAL HERNIA REPAIR Right    INTRAOPERATIVE TRANSTHORACIC ECHOCARDIOGRAM  03/19/2018   Procedure: Intraoperative Transthoracic Echocardiogram;   Surgeon: Sherren Mocha, MD;  Location: [facility];  Service: Open Heart Surgery;;   IR FLUORO GUIDE CV LINE RIGHT  06/14/2020   IR RADIOLOGIST EVAL & MGMT  06/10/2020   JOINT REPLACEMENT     KNEE ARTHROTOMY Left 10/09/2012   Procedure: LEFT KNEE ARTHROTOMY;  Surgeon: Gearlean Alf, MD;  Location: [facility];  Service: Orthopedics;  Laterality: Left;   LEFT HEART CATH AND CORONARY ANGIOGRAPHY N/A 02/28/2018   Procedure: LEFT HEART CATH AND CORONARY ANGIOGRAPHY;  Surgeon: Sherren Mocha, MD;  Location: [facility];  Service: Cardiovascular;  Laterality: N/A;   LEFT HEART CATH AND CORONARY ANGIOGRAPHY N/A 09/12/2019   Procedure: LEFT HEART CATH AND CORONARY ANGIOGRAPHY;  Surgeon: Sherren Mocha, MD;  Location: [facility];  Service: Cardiovascular;  Laterality: N/A;   PROSTATECTOMY  04/25/2004   REPLACEMENT TOTAL KNEE Bilateral    SKIN BIOPSY     "off nose; wasn't cancer; it was tested" (03/14/2017)   TRANSCATHETER AORTIC VALVE REPLACEMENT, TRANSFEMORAL N/A 03/19/2018   Procedure: TRANSCATHETER AORTIC VALVE REPLACEMENT,  TRANSFEMORAL;  Surgeon: Sherren Mocha, MD;  Location: [facility];  Service: Open Heart Surgery;  Laterality: N/A;   Uretheral implants     multiple for incontinence   VENA CAVA FILTER PLACEMENT     Family History  Problem Relation Age of Onset   Melanoma Father    Melanoma Brother     Social History   Tobacco Use   Smoking status: Former    Packs/day: 1.00    Years: 10.00    Pack years: 10.00    Types: Cigarettes    Quit date: 04/27/1961    Years since quitting: 59.6   Smokeless tobacco: Never  Substance Use Topics   Alcohol use: No    Alcohol/week: 0.0 standard drinks   Marital Status: Married  ROS  Review of Systems  Cardiovascular:  Positive for dyspnea on exertion (mild and stable). Negative for chest pain and leg swelling.  Musculoskeletal:  Positive for arthritis.  Gastrointestinal:  Negative for melena.  Objective  Blood pressure (!) 155/62, pulse (!) 49,  temperature (!) 97.1 F (36.2 C), temperature source Temporal, resp. rate 14, height 6' (1.829 m), weight 206 lb (93.4 kg), SpO2 96 %. Body mass index is 27.94 kg/m.  Vitals with BMI 12/08/2020 08/29/2020 08/29/2020  Height '6\' 0"'  - -  Weight 206 lbs - -  BMI 67.12 - -  Systolic 458 099 833  Diastolic 62 80 80  Pulse 49 70 63     Physical Exam Constitutional:      Appearance: He is obese.  Neck:     Vascular: No carotid bruit or JVD.  Cardiovascular:     Rate and Rhythm: Normal rate and regular rhythm.     Pulses: Intact distal pulses.          Popliteal pulses are 2+ on the right side and 2+ on the left side.       Dorsalis pedis pulses are 2+ on the right side and 1+ on the left side.       Posterior tibial pulses are 2+ on the right side and 1+ on the left side.     Heart sounds: Murmur heard.  Early systolic murmur is present with a grade of 2/6 at the upper right sternal border.    No gallop.  Pulmonary:     Effort: Pulmonary effort is normal.     Breath sounds: Normal breath sounds.  Abdominal:     General: Bowel sounds are normal.     Palpations: Abdomen is soft.  Musculoskeletal:        General: No swelling.     Laboratory examination:   Recent Labs    08/23/20 0439 08/25/20 0428 08/28/20 2100  NA 137 140 138  K 3.3* 4.9 4.2  CL 110 109 103  CO2 '23 27 26  ' GLUCOSE 122* 99 124*  BUN '19 17 10  ' CREATININE 0.90 1.08 1.41*  CALCIUM 8.4* 9.0 9.2  GFRNONAA >60 >60 48*   CrCl cannot be calculated (Patient's most recent lab result is older than the maximum 21 days allowed.).  CMP Latest Ref Rng & Units 08/28/2020 08/25/2020 08/23/2020  Glucose 70 - 99 mg/dL 124(H) 99 122(H)  BUN 8 - 23 mg/dL '10 17 19  ' Creatinine 0.61 - 1.24 mg/dL 1.41(H) 1.08 0.90  Sodium 135 - 145 mmol/L 138 140 137  Potassium 3.5 - 5.1 mmol/L 4.2 4.9 3.3(L)  Chloride 98 - 111 mmol/L 103 109 110  CO2 22 - 32 mmol/L 26 27 23  Calcium 8.9 - 10.3 mg/dL 9.2 9.0 8.4(L)  Total Protein 6.5 - 8.1 g/dL  6.4(L) - -  Total Bilirubin 0.3 - 1.2 mg/dL 1.1 - -  Alkaline Phos 38 - 126 U/L 177(H) - -  AST 15 - 41 U/L 37 - -  ALT 0 - 44 U/L 14 - -   CBC Latest Ref Rng & Units 08/28/2020 08/25/2020 08/23/2020  WBC 4.0 - 10.5 K/uL 6.8 5.9 8.0  Hemoglobin 13.0 - 17.0 g/dL 13.1 11.5(L) 11.2(L)  Hematocrit 39.0 - 52.0 % 41.4 36.7(L) 35.0(L)  Platelets 150 - 400 K/uL 290 186 149(L)    Lipid Panel No results for input(s): CHOL, TRIG, LDLCALC, VLDL, HDL, CHOLHDL, LDLDIRECT in the last 8760 hours. Lipid Panel     Component Value Date/Time   CHOL 118 07/14/2019 1343   TRIG 141 07/14/2019 1343   HDL 32 (L) 07/14/2019 1343   CHOLHDL 3.7 07/14/2019 1343   CHOLHDL 3 04/28/2011 1003   VLDL 17.0 04/28/2011 1003   LDLCALC 61 07/14/2019 1343   LABVLDL 25 07/14/2019 1343     HEMOGLOBIN A1C Lab Results  Component Value Date   HGBA1C 6.3 (H) 03/18/2018   MPG 134.11 03/18/2018   TSH No results for input(s): TSH in the last 8760 hours.   Medications and allergies   Allergies  Allergen Reactions   Darifenacin Nausea Only and Other (See Comments)    dry mouth and dizziness ENABLEX    Codeine Other (See Comments)    Unknown reaction   Sulfamethoxazole-Trimethoprim Other (See Comments)    Caused major confusion   Lisinopril Cough     Medication prior to this encounter:   Outpatient Medications Prior to Visit  Medication Sig Dispense Refill   acetaminophen (TYLENOL) 650 MG CR tablet Take 650 mg by mouth every 8 (eight) hours as needed for pain (headache).     cholecalciferol (VITAMIN D) 1000 UNITS tablet Take 1,000 Units by mouth daily.     docusate sodium (COLACE) 100 MG capsule Take 100 mg by mouth 2 (two) times daily.     furosemide (LASIX) 20 MG tablet Take 1 tablet (20 mg total) by mouth daily as needed. 90 tablet 1   isosorbide mononitrate (IMDUR) 30 MG 24 hr tablet Take 0.5 tablets (15 mg total) by mouth daily. (Patient taking differently: Take 15 mg by mouth at bedtime.) 45 tablet 3    Multiple Vitamin (MULTIVITAMIN WITH MINERALS) TABS tablet Take 1 tablet by mouth daily.     nitroGLYCERIN (NITROSTAT) 0.4 MG SL tablet Place 1 tablet (0.4 mg total) under the tongue every 5 (five) minutes as needed. 25 tablet 3   omeprazole (PRILOSEC) 20 MG capsule Take 20 mg by mouth daily.     pravastatin (PRAVACHOL) 20 MG tablet TAKE 1 TABLET BY MOUTH EVERY DAY 90 tablet 3   PRESCRIPTION MEDICATION Inhale into the lungs at bedtime. CPAP     vitamin B-12 (CYANOCOBALAMIN) 500 MCG tablet Take 500 mcg by mouth daily.     Ascorbic Acid (VITAMIN C) 1000 MG tablet Take 1,000 mg by mouth daily.     BIOTIN PO Take 500 mcg by mouth daily.     Glucosamine HCl (GLUCOSAMINE PO) Take 2,000 mg by mouth daily.     Omega-3 Fatty Acids (FISH OIL) 1000 MG CAPS Take 1,000 mg by mouth daily.     warfarin (COUMADIN) 2.5 MG tablet Take 1-1.5 tablets (2.5-3.75 mg total) by mouth See admin instructions. Take 1 tablet (2.5 mg) by mouth daily  at bedtime - except on 08/27/2020 take 1 1/2 tablets (3.75 mg). Recheck on Wednesday 09/01/2020     clotrimazole (LOTRIMIN) 1 % cream Apply to affected area 2 times daily 60 g 0   diphenoxylate-atropine (LOMOTIL) 2.5-0.025 MG tablet Take 1 tablet by mouth 4 (four) times daily as needed for diarrhea or loose stools. 30 tablet 0   ondansetron (ZOFRAN ODT) 4 MG disintegrating tablet Take 1 tablet (4 mg total) by mouth every 8 (eight) hours as needed for nausea or vomiting. 10 tablet 0   pantoprazole (PROTONIX) 40 MG tablet Take 1 tablet (40 mg total) by mouth daily at 6 (six) AM. 30 tablet 0   Zinc Oxide (TRIPLE PASTE) 12.8 % ointment Apply topically 4 (four) times daily. 56.7 g 1   No facility-administered medications prior to visit.     Medication list after today's encounter   Current Outpatient Medications  Medication Instructions   acetaminophen (TYLENOL) 650 mg, Oral, Every 8 hours PRN   carvedilol (COREG) 3.125 mg, Oral, 2 times daily   cholecalciferol (VITAMIN D) 1,000  Units, Oral, Daily   clotrimazole (LOTRIMIN) 1 % cream Apply to affected area 2 times daily   diphenoxylate-atropine (LOMOTIL) 2.5-0.025 MG tablet 1 tablet, Oral, 4 times daily PRN   docusate sodium (COLACE) 100 mg, Oral, 2 times daily   furosemide (LASIX) 20 mg, Oral, Daily PRN   isosorbide mononitrate (IMDUR) 15 mg, Oral, Daily   Multiple Vitamin (MULTIVITAMIN WITH MINERALS) TABS tablet 1 tablet, Oral, Daily   nitroGLYCERIN (NITROSTAT) 0.4 mg, Sublingual, Every 5 min PRN   omeprazole (PRILOSEC) 20 mg, Oral, Daily   pravastatin (PRAVACHOL) 20 MG tablet TAKE 1 TABLET BY MOUTH EVERY DAY   PRESCRIPTION MEDICATION Inhalation, Daily at bedtime, CPAP   Rivaroxaban (XARELTO) 15 mg, Oral, Daily with supper   vitamin B-12 (CYANOCOBALAMIN) 500 mcg, Oral, Daily    Radiology:   No results found.  Cardiac Studies:    Coronary angiogram 09/12/2019 (H/O PCI to RCA and Cx 2017): 1. Status post TAVR 2020 with no significant transaortic valve gradient measured by pullback 2. Status post RCA stenting with continued patency of the stents in the mid vessel, mild in-stent restenosis, and moderate distal vessel disease unchanged from the previous study 3. Status post stenting of the left circumflex with continued stent patency and mild in-stent restenosis, severe subbranch stenosis in the distal OM appropriate for medical therapy as this is a medium caliber distal branch vessel with stable disease compared with previous cath 4. Mild nonobstructive LAD stenosis with no flow-limiting lesions identified  TAVR (29 mm Edwards Sapien 3, 03/19/2018).  Echocardiogram 03/18/2020:   1. 1 year post TAVR, normal transaortic peak/mean gradients 18/9 mmHg, no paravalvular leak.  2. Left ventricular ejection fraction, by estimation, is 50 to 55%. The left ventricle has low normal function. The left ventrical has no regional wall motion abnormalities. Left ventricular diastolic function could not be evaluated.  3. Right  ventricular systolic function is normal. The right ventricular size is normal. There is normal pulmonary artery systolic pressure.  4. Left atrial size was moderately dilated. No left atrial/left atrial appendage thrombus was detected.  5. Right atrial size was mildly dilated.  6. The mitral valve is normal in structure and function. no evidence of mitral valve regurgitation. No evidence of mitral stenosis.  7. No paravalvular leak.. The aortic valve has been repaired/replaced. Aortic valve regurgitation is not visualized. No aortic stenosis is present. 44m valve is present in the aortic position.  Echo findings show normal structure and function of the  aortic prosthesis.  8. The inferior vena cava is normal in size with greater than 50% respiratory variability, suggesting right atrial pressure of 3 mmHg.   Cardiac pacemaker in situ 03/14/2017  Biventricular St. Jude inserted 03/14/17 Dr. Lovena Le for second degree heart block    EKG:   EKG 12/08/2020: Underlying atrial fibrillation with ventricularly paced rhythm at the rate of 60 bpm, no further analysis.  Biventricular pacemaker detected.      Assessment     ICD-10-CM   1. Atherosclerosis of native coronary artery of native heart with stable angina pectoris (HCC)  I25.118 EKG 12-Lead    CBC    CMP14+EGFR    Lipid Panel With LDL/HDL Ratio    2. Chronic combined systolic and diastolic heart failure (HCC)  I50.42 TSH    carvedilol (COREG) 3.125 MG tablet    DNR (Do Not Resuscitate)    3. Permanent atrial fibrillation (HCC)  I48.21 Rivaroxaban (XARELTO) 15 MG TABS tablet    4. S/P TAVR (transcatheter aortic valve replacement) 2020  Z95.2     5. Stage 3a chronic kidney disease (HCC)  N18.31        Medications Discontinued During This Encounter  Medication Reason   ondansetron (ZOFRAN ODT) 4 MG disintegrating tablet Error   pantoprazole (PROTONIX) 40 MG tablet Error   Zinc Oxide (TRIPLE PASTE) 12.8 % ointment Error   Omega-3 Fatty  Acids (FISH OIL) 1000 MG CAPS Discontinued by provider   Glucosamine HCl (GLUCOSAMINE PO) Discontinued by provider   Ascorbic Acid (VITAMIN C) 1000 MG tablet Discontinued by provider   BIOTIN PO Discontinued by provider   warfarin (COUMADIN) 2.5 MG tablet Change in therapy    Meds ordered this encounter  Medications   carvedilol (COREG) 3.125 MG tablet    Sig: Take 1 tablet (3.125 mg total) by mouth 2 (two) times daily.    Dispense:  180 tablet    Refill:  3   Rivaroxaban (XARELTO) 15 MG TABS tablet    Sig: Take 1 tablet (15 mg total) by mouth daily with supper.    Dispense:  30 tablet    Refill:  3    Orders Placed This Encounter  Procedures   CBC   CMP14+EGFR   Lipid Panel With LDL/HDL Ratio   TSH   DNR (Do Not Resuscitate)    Patient and wife's preference    Order Specific Question:   In the event of cardiac or respiratory ARREST    Answer:   Do not call a "code blue"    Order Specific Question:   In the event of cardiac or respiratory ARREST    Answer:   Do not perform Intubation, CPR, defibrillation or ACLS    Order Specific Question:   In the event of cardiac or respiratory ARREST    Answer:   Use medication by any route, position, wound care, and other measures to relive pain and suffering. May use oxygen, suction and manual treatment of airway obstruction as needed for comfort.   EKG 12-Lead    Recommendations:   [name] is a 87 y.o. Caucasian male patient with coronary artery disease, history of TAVR, biventricular pacemaker implantation for Caucasian male patient with known coronary artery disease, ischemic cardiomyopathy SP biventricular cardiac pacemaker implantation, permanent atrial fibrillation, severe aortic stenosis SP TAVR on 03/19/2018, deconditioning, persistent E. coli diarrhea, history of perinephric abscess, chronic renal insufficiency, presents to establish care, presently in  palliative care and wanted a second opinion.  He has recuperated  extremely well and has not had any recent hospitalization in the past >6 months.  He has not had any further hospitalizations in July 2022, at that point he had refused any further antibiotic therapy for perinephric infection.  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  Today he has returned back to complete physical activity that he normally does at home without any limitations.  In fact home health has discharged him.  There is no clinical evidence of heart failure, there is no gallop, no JVD or leg edema and lungs are clear.  He is also tolerating anticoagulation for permanent atrial fibrillation.  However wife is having difficulty in view of generalized debility from his advanced age to coming to the clinic, will discontinue Coumadin and start him on Xarelto 15 mg daily in view of stage IIIa chronic kidney disease.  I will also obtain CBC, CMP and lipid profile today.  He is still NPO.  He looks extremely well.  I do not think he is at the stage where he should be in hospice/palliative care, I suspect his longevity is probably >6 months at this point.  He should be DNR and patient also wishes to be a DNR.  With regard to coronary artery disease and TAVR, he has not had any angina pectoris, recent echocardiogram looks very stable, also his ejection fraction is improved since he has a BiV ventricular pacemaker.  I do not know the functioning of the same.  Continue furosemide that he is taking for now, I will add a low-dose of a beta-blocker, carvedilol 3.25 mg twice daily.  I would like to see him back in 3 months for follow-up, I will also place him in RPM/CPM for heart failure management to decrease his office visits and potentially prevent hospitalization.  I have discontinued multiple supplements that he has been taking chronically.  I will certainly forward my office visit notes to Dr. Hollace Kinnier.  This was a >60-minute office visit encounter.    Adrian Prows, MD, [facility] 12/08/2020, 2:33 PM Office: [phone]

## 2020-12-09 DIAGNOSIS — I5042 Chronic combined systolic (congestive) and diastolic (congestive) heart failure: Secondary | ICD-10-CM | POA: Diagnosis not present

## 2020-12-09 DIAGNOSIS — I25118 Atherosclerotic heart disease of native coronary artery with other forms of angina pectoris: Secondary | ICD-10-CM | POA: Diagnosis not present

## 2020-12-10 LAB — CMP14+EGFR
ALT: 5 IU/L (ref 0–44)
AST: 15 IU/L (ref 0–40)
Albumin/Globulin Ratio: 1.2 (ref 1.2–2.2)
Albumin: 3.5 g/dL — ABNORMAL LOW (ref 3.6–4.6)
Alkaline Phosphatase: 97 IU/L (ref 44–121)
BUN/Creatinine Ratio: 13 (ref 10–24)
BUN: 18 mg/dL (ref 8–27)
Bilirubin Total: 0.5 mg/dL (ref 0.0–1.2)
CO2: 20 mmol/L (ref 20–29)
Calcium: 10 mg/dL (ref 8.6–10.2)
Chloride: 106 mmol/L (ref 96–106)
Creatinine, Ser: 1.4 mg/dL — ABNORMAL HIGH (ref 0.76–1.27)
Globulin, Total: 2.9 g/dL (ref 1.5–4.5)
Glucose: 90 mg/dL (ref 70–99)
Potassium: 4.4 mmol/L (ref 3.5–5.2)
Sodium: 141 mmol/L (ref 134–144)
Total Protein: 6.4 g/dL (ref 6.0–8.5)
eGFR: 49 mL/min/{1.73_m2} — ABNORMAL LOW (ref >59)

## 2020-12-10 LAB — CBC
Hematocrit: 41.2 % (ref 37.5–51.0)
Hemoglobin: 13.1 g/dL (ref 13.0–17.7)
MCH: 25.7 pg — ABNORMAL LOW (ref 26.6–33.0)
MCHC: 31.8 g/dL (ref 31.5–35.7)
MCV: 81 fL (ref 79–97)
Platelets: 248 10*3/uL (ref 150–450)
RBC: 5.1 x10E6/uL (ref 4.14–5.80)
RDW: 14.2 % (ref 11.6–15.4)
WBC: 6.5 10*3/uL (ref 3.4–10.8)

## 2020-12-10 LAB — LIPID PANEL WITH LDL/HDL RATIO
Cholesterol, Total: 107 mg/dL (ref 100–199)
HDL: 39 mg/dL — ABNORMAL LOW (ref >39)
LDL Chol Calc (NIH): 53 mg/dL (ref 0–99)
LDL/HDL Ratio: 1.4 ratio (ref 0.0–3.6)
Triglycerides: 74 mg/dL (ref 0–149)
VLDL Cholesterol Cal: 15 mg/dL (ref 5–40)

## 2020-12-10 LAB — TSH: TSH: 1.24 u[IU]/mL (ref 0.450–4.500)

## 2020-12-14 ENCOUNTER — Ambulatory Visit (INDEPENDENT_AMBULATORY_CARE_PROVIDER_SITE_OTHER): Payer: Medicare Other

## 2020-12-14 DIAGNOSIS — I4819 Other persistent atrial fibrillation: Secondary | ICD-10-CM

## 2020-12-14 LAB — CUP PACEART REMOTE DEVICE CHECK
Battery Remaining Longevity: 25 mo
Battery Remaining Percentage: 37 %
Battery Voltage: 2.93 V
Brady Statistic AP VP Percent: 0 %
Brady Statistic AP VS Percent: 0 %
Brady Statistic AS VP Percent: 100 %
Brady Statistic AS VS Percent: 0 %
Brady Statistic RA Percent Paced: 1 %
Date Time Interrogation Session: 20221108122845
Implantable Lead Implant Date: 20190206
Implantable Lead Implant Date: 20190206
Implantable Lead Implant Date: 20190206
Implantable Lead Location: 753858
Implantable Lead Location: 753859
Implantable Lead Location: 753860
Implantable Pulse Generator Implant Date: 20190206
Lead Channel Impedance Value: 360 Ohm
Lead Channel Impedance Value: 400 Ohm
Lead Channel Impedance Value: 640 Ohm
Lead Channel Pacing Threshold Amplitude: 0.375 V
Lead Channel Pacing Threshold Amplitude: 0.75 V
Lead Channel Pacing Threshold Amplitude: 2.5 V
Lead Channel Pacing Threshold Pulse Width: 0.4 ms
Lead Channel Pacing Threshold Pulse Width: 0.5 ms
Lead Channel Pacing Threshold Pulse Width: 0.8 ms
Lead Channel Sensing Intrinsic Amplitude: 2.7 mV
Lead Channel Sensing Intrinsic Amplitude: 7 mV
Lead Channel Setting Pacing Amplitude: 1.375
Lead Channel Setting Pacing Amplitude: 2 V
Lead Channel Setting Pacing Amplitude: 3.25 V
Lead Channel Setting Pacing Pulse Width: 0.4 ms
Lead Channel Setting Pacing Pulse Width: 0.8 ms
Lead Channel Setting Sensing Sensitivity: 2 mV
Pulse Gen Model: 3262
Pulse Gen Serial Number: 8995812

## 2020-12-15 DIAGNOSIS — I2510 Atherosclerotic heart disease of native coronary artery without angina pectoris: Secondary | ICD-10-CM | POA: Diagnosis not present

## 2020-12-15 DIAGNOSIS — R197 Diarrhea, unspecified: Secondary | ICD-10-CM | POA: Diagnosis not present

## 2020-12-15 DIAGNOSIS — I4819 Other persistent atrial fibrillation: Secondary | ICD-10-CM | POA: Diagnosis not present

## 2020-12-15 DIAGNOSIS — I5042 Chronic combined systolic (congestive) and diastolic (congestive) heart failure: Secondary | ICD-10-CM | POA: Diagnosis not present

## 2020-12-15 DIAGNOSIS — R54 Age-related physical debility: Secondary | ICD-10-CM | POA: Diagnosis not present

## 2020-12-22 NOTE — Progress Notes (Signed)
Remote pacemaker transmission.   

## 2021-01-10 DIAGNOSIS — I5042 Chronic combined systolic (congestive) and diastolic (congestive) heart failure: Secondary | ICD-10-CM | POA: Diagnosis not present

## 2021-01-11 ENCOUNTER — Telehealth: Payer: Self-pay | Admitting: Cardiology

## 2021-01-11 ENCOUNTER — Telehealth: Payer: Self-pay | Admitting: Pharmacist

## 2021-01-11 NOTE — Telephone Encounter (Signed)
The Anticoagulation Clinic was aware that the pt is on Xarelto and it is noted on his closed anticoagulation episodes of care summary. Also, we are aware the pt is following with Dr. Einar Gip at [facility].

## 2021-01-11 NOTE — Telephone Encounter (Signed)
[facility] cardiology called stating patient will no longer need coumadin check as he is now on Xarelto.

## 2021-01-11 NOTE — Telephone Encounter (Signed)
Please release hi pacemaker to our clinic

## 2021-01-12 NOTE — Telephone Encounter (Signed)
I have released him so your team and enroll him in your clinic.  Thanks! Leigh

## 2021-01-13 ENCOUNTER — Telehealth: Payer: Self-pay | Admitting: Pharmacist

## 2021-01-13 DIAGNOSIS — K625 Hemorrhage of anus and rectum: Secondary | ICD-10-CM | POA: Diagnosis not present

## 2021-01-13 NOTE — Telephone Encounter (Signed)
Would hold Xarelto for 3 days and restart

## 2021-01-13 NOTE — Telephone Encounter (Signed)
Pt called stating that over the past few days pt has been having complains of rectal bleeding. Pt noted to have known history of active hemorrhoids and has been using suppository to help manage the bleed. Pt denies any complains of melena or hematochezia (no blood in the toilet bowl), but noticed blood in the tissue paper yesterday. Pt complains of seeing large dark red spots in his adult pad over the past day or so. Pt recently started Xarelto 15 mg Qday ~1 month ago. Pt was recommended to follow up with GI provider. GI provider had pt get labs completed.

## 2021-01-13 NOTE — Telephone Encounter (Signed)
Reviewed with pt's wife. Planing on holding from today to Saturday. GI aware and involved.

## 2021-02-09 DIAGNOSIS — I2510 Atherosclerotic heart disease of native coronary artery without angina pectoris: Secondary | ICD-10-CM | POA: Diagnosis not present

## 2021-02-09 DIAGNOSIS — R197 Diarrhea, unspecified: Secondary | ICD-10-CM | POA: Diagnosis not present

## 2021-02-09 DIAGNOSIS — I4819 Other persistent atrial fibrillation: Secondary | ICD-10-CM | POA: Diagnosis not present

## 2021-02-09 DIAGNOSIS — I5042 Chronic combined systolic (congestive) and diastolic (congestive) heart failure: Secondary | ICD-10-CM | POA: Diagnosis not present

## 2021-02-10 DIAGNOSIS — I5042 Chronic combined systolic (congestive) and diastolic (congestive) heart failure: Secondary | ICD-10-CM | POA: Diagnosis not present

## 2021-02-14 ENCOUNTER — Encounter: Payer: Self-pay | Admitting: Cardiology

## 2021-02-14 DIAGNOSIS — I5042 Chronic combined systolic (congestive) and diastolic (congestive) heart failure: Secondary | ICD-10-CM | POA: Diagnosis not present

## 2021-02-14 DIAGNOSIS — Z45018 Encounter for adjustment and management of other part of cardiac pacemaker: Secondary | ICD-10-CM

## 2021-02-14 DIAGNOSIS — I255 Ischemic cardiomyopathy: Secondary | ICD-10-CM | POA: Insufficient documentation

## 2021-02-14 HISTORY — DX: Encounter for adjustment and management of other part of cardiac pacemaker: Z45.018

## 2021-02-14 HISTORY — DX: Ischemic cardiomyopathy: I25.5

## 2021-02-27 ENCOUNTER — Other Ambulatory Visit: Payer: Self-pay | Admitting: Cardiology

## 2021-02-28 DIAGNOSIS — I2510 Atherosclerotic heart disease of native coronary artery without angina pectoris: Secondary | ICD-10-CM | POA: Diagnosis not present

## 2021-02-28 DIAGNOSIS — R42 Dizziness and giddiness: Secondary | ICD-10-CM | POA: Diagnosis not present

## 2021-02-28 DIAGNOSIS — G4739 Other sleep apnea: Secondary | ICD-10-CM | POA: Diagnosis not present

## 2021-02-28 DIAGNOSIS — I480 Paroxysmal atrial fibrillation: Secondary | ICD-10-CM | POA: Diagnosis not present

## 2021-02-28 DIAGNOSIS — I5022 Chronic systolic (congestive) heart failure: Secondary | ICD-10-CM | POA: Diagnosis not present

## 2021-03-01 ENCOUNTER — Telehealth: Payer: Self-pay | Admitting: *Deleted

## 2021-03-01 NOTE — Telephone Encounter (Signed)
Dr. Mariea Clonts requesting labs be drawn on patient, CBC w/diff, BMP and liver panel. Visit scheduled for 03/02/21@1p .

## 2021-03-02 ENCOUNTER — Other Ambulatory Visit: Payer: Medicare Other | Admitting: *Deleted

## 2021-03-02 ENCOUNTER — Other Ambulatory Visit: Payer: Self-pay

## 2021-03-02 DIAGNOSIS — I5042 Chronic combined systolic (congestive) and diastolic (congestive) heart failure: Secondary | ICD-10-CM | POA: Diagnosis not present

## 2021-03-02 DIAGNOSIS — Z515 Encounter for palliative care: Secondary | ICD-10-CM

## 2021-03-03 NOTE — Progress Notes (Signed)
[facility]ALLIATIVE [facility] RN NOTE  PATIENT NAME: [name] DOB: February 26, 1931 MRN: [id number]  PRIMARY CARE PROVIDER: Gayland Curry, DO  RESPONSIBLE PARTY: [name] Leatham (wife) Acct ID - Guarantor Home Phone Work Phone Relationship Acct Type  [id number] MACKIE, HOLNESS* [phone]  Self P/F     [place], [address], [place] [address]   Covid-19 Pre-screening Negative  RN visit completed for labwork (CBC w/diff, BMP, Liver Panel) as ordered by Dr. Mariea Clonts. Patient tolerated well in left antecubital. Specimens taken to [facility] on [address].    (Duration of visit and documentation 30 minutes)     Daryl Eastern, RN BSN

## 2021-03-13 DIAGNOSIS — I5042 Chronic combined systolic (congestive) and diastolic (congestive) heart failure: Secondary | ICD-10-CM | POA: Diagnosis not present

## 2021-03-15 ENCOUNTER — Other Ambulatory Visit: Payer: Medicare Other | Admitting: Internal Medicine

## 2021-03-15 ENCOUNTER — Encounter: Payer: Self-pay | Admitting: Internal Medicine

## 2021-03-15 ENCOUNTER — Other Ambulatory Visit: Payer: Self-pay

## 2021-03-15 VITALS — BP 118/60 | HR 68 | Temp 97.7°F | Resp 18 | Wt 200.0 lb

## 2021-03-15 DIAGNOSIS — R058 Other specified cough: Secondary | ICD-10-CM | POA: Diagnosis not present

## 2021-03-15 DIAGNOSIS — N151 Renal and perinephric abscess: Secondary | ICD-10-CM | POA: Diagnosis not present

## 2021-03-15 DIAGNOSIS — R61 Generalized hyperhidrosis: Secondary | ICD-10-CM | POA: Diagnosis not present

## 2021-03-15 DIAGNOSIS — R739 Hyperglycemia, unspecified: Secondary | ICD-10-CM

## 2021-03-15 DIAGNOSIS — Z515 Encounter for palliative care: Secondary | ICD-10-CM | POA: Diagnosis not present

## 2021-03-15 NOTE — Progress Notes (Signed)
[place] Visit Telephone: [phone]  Fax: [phone]   Date of encounter: 03/15/21 8:23 AM PATIENT NAME: [name] [address] [place] [address]   [phone] (home)  DOB: Jun 19, 1931 MRN: [id number] PRIMARY CARE PROVIDER:    Gayland Curry, DO,  [place] [address] [phone]  REFERRING PROVIDER:   Gayland Curry, DO [place],  [place] [address] [phone]  RESPONSIBLE PARTY:    Contact Information     Name Relation Home Work Mobile   [name] [phone]  [phone]   [name] Daughter   [phone]        I met face to face with patient and family in his home. Palliative Care was asked to follow this patient by consultation request of  Noah Curry, DO to address advance care planning and complex medical decision making. This is follow-up visit.                                     ASSESSMENT AND PLAN / RECOMMENDATIONS:   Advance Care Planning/Goals of Care: Goals include to maximize quality of life and symptom management. Patient/health care surrogate gave his/her permission to discuss.Our advance care planning conversation included a discussion about:    The value and importance of advance care planning  Experiences with loved ones who have been seriously ill or have died  Exploration of personal, cultural or spiritual beliefs that might influence medical decisions  Exploration of goals of care in the event of a sudden injury or illness  Identification  of a healthcare agent  Review and updating or creation of an  advance directive document . Decision not to resuscitate or to de-escalate disease focused treatments due to poor prognosis. CODE STATUS:  DNR, MOST completed on file in [facility]yn[facility]  Symptom Management/Plan: 1. 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 Nonproductive cough -this has been worse the past several days so he did agree to a portable CXR 2 view -note that we did reduce his lasix also to qod last time due to his dizziness (though he was not orthostatic)  3. Renal abscess -I am concerned that this is starting to cause a problem again, but pt has adamantly declined workup and his wife supports his decision after what he went through during his last hospitalization--he was miserable while getting IV abx  4.  Hyperglycemia -need to check hba1c but appears he may have developed diabetes which could also explain sweats and weight loss--noted after visit when his wife called me with cBGs in hpi  4. Palliative care encounter -he declines workup for renal abscess recurrence, iv abx or ivf, and wants to be made comfortable--his wife supports this decision -he is having good and bad days but notably is also losing weight (6 lbs) which is concerning, as well)   Follow up Palliative Care Visit: Palliative care  will continue to follow for complex medical decision making, advance care planning, and clarification of goals. F/u prn--[name] will call me.   This visit was coded based on medical decision making (MDM).  20 mins spent on ACP given persistent infection.  PPS: 50%  HOSPICE ELIGIBILITY/DIAGNOSIS: Will be if signs of decline with recurrent symptoms of renal abscess  Chief Complaint: Follow-up palliative visit  HISTORY OF PRESENT ILLNESS:  [name] is a 88 y.o. year old male  with renal abscess that was treated with more than 12 wks of IV abx, h/o AS  s/p AVR, hyper .   Had 4 nightsweats 20 mins apart.  CBG was 188  amid a sweat late at night and repeat was 160 when family was visiting.  He does not have known diabetes, but this is worrisome.  No more after he moved to the couch and no dizziness this am.  Says he's not coughing either. He is blaming his cpap machine and wants to use a different machine than before (old machine).  Settings are different between them.  Mask had been leaking air, but has been ok past few nights.  He has been coughing more lately and productive of mucus.  He has been short of breath also.   When they went out to dinner, he had shaking chills--it was very cold in there.  Others did think it was cold, but he was weak and they ended up taking their dinner home earlier this week.   He has good and bad days.   Sometimes feels good in am, but declines 3pm and by evening can hardly make it to the bedroom--is "wore out".  He gives out too easily to do his therapy exercises.  Using walker again.   His temp has run 97.4-97.8.   Has clear caption free phone from federal govt that hooks up through internet--can see captioned what is being said.   He did have an episode of back pain the other day  History obtained from review of EMR, discussion with primary team, and interview with family, facility staff/caregiver and/or [name].  I reviewed available labs, medications, imaging, studies and related documents from the EMR.  Records reviewed and summarized above.   ROS   General: NAD EYES: denies vision changes ENMT: denies dysphagia Cardiovascular: denies chest pain, denies DOE but appears sob Pulmonary: has, denies increased SOB Abdomen: endorses decreased appetite, denies constipation, endorses continence of bowel GU: denies dysuria, endorses continence of urine MSK:  has increased weakness,  no falls reported Skin: denies rashes or wounds Neurological: denies pain but had some on his right side around the back  a few nights ago, denies insomnia Psych: Endorses positive mood but was tearful with the consideration that an infection could be back--denies its existence Heme/lymph/immuno: denies bruises, abnormal bleeding  Physical Exam: Current and past weights: 200 lbs down from 206 lbs in nov Constitutional: NAD General: obese  EYES: anicteric sclera, lids intact, no discharge  ENMT: intact hearing, oral mucous membranes moist, dentition intact CV: irreg irreg, no LE edema Pulmonary: few rhonchi anteriorly, increased work of breathing, dry cough, room air Abdomen: intake 50%, normo-active BS + 4 quadrants, soft and non tender, no ascites GU: deferred MSK: no sarcopenia, moves all extremities, ambulatory but weak, using walker or cane again Skin: warm and dry, no rashes or wounds on visible skin Neuro:  has generalized weakness,  mild cognitive impairment-repeats stories and has some word-finding difficulty, his wife helps out Psych:  non-anxious affect, A and O x 3 Hem/lymph/immuno: no widespread bruising  CURRENT PROBLEM LIST:  Patient Active Problem List   Diagnosis Date Noted   Encounter for care of pacemaker 02/14/2021   Ischemic cardiomyopathy 02/14/2021   Acute encephalopathy 08/29/2020   PICC (peripherally inserted central catheter) in place 06/11/2020   Medication monitoring encounter 06/11/2020   Renal abscess    Perirectal abscess 05/15/2020   Pyelonephritis of right kidney 05/15/2020   Presence of IVC filter 05/15/2020   Constipation, chronic 05/15/2020   Cysts of right kidney 05/15/2020   Severe aortic stenosis    OSA on CPAP    History of prostate cancer    History of peptic ulcer    History of kidney stones    GERD    CAD (coronary artery disease), native coronary artery    Arthritis    Bulbous urethral stricture 12/03/2019   Internal hemorrhoids 09/11/2018   Noise effect on both inner ears 08/14/2018   Presbycusis of both ears 08/14/2018   S/P TAVR (transcatheter  aortic valve replacement) 03/19/2018   CKD (chronic kidney disease), stage III (HCC)    Chronic combined systolic and diastolic heart failure ([place]) 11/19/2017   Second degree Mobitz II AV block 03/14/2017   Pacemaker (BiV) CRTP St. 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[place]) 04/05/2015   CAD (coronary artery disease), native coronary artery 04/05/2015   Aortic valve stenosis 04/05/2015   Persistent atrial fibrillation ([place]) 09/19/2014   Moderate persistent asthma without complication 08/65/7846   Atrial fibrillation, persistent ([place]) 09/19/2014   Angina pectoris ([place]) 09/18/2014   Urinary incontinence 03/04/2014   Obesity (BMI 30-39.9)    Long term (current) use of anticoagulants 10/06/2011   Mixed hyperlipidemia 02/24/2010   Gastro-esophageal reflux disease without esophagitis 09/24/2009   PAST MEDICAL HISTORY:  Active Ambulatory Problems    Diagnosis Date Noted   Personal history of malignant neoplasm of prostate 04/05/2015   Hypertensive heart disease 04/05/2015   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) 01/24/2016   Gastro-esophageal reflux disease without esophagitis 09/24/2009   Mixed hyperlipidemia 02/24/2010   Long term (current) use of anticoagulants 10/06/2011   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 Jude Alure  03/14/17  03/14/2017   Chronic combined systolic and diastolic heart failure ([place]) 11/19/2017   CKD (chronic kidney disease), stage III (HCC)    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   Internal hemorrhoids 09/11/2018   Noise effect on both inner ears 08/14/2018   Presbycusis of both ears 08/14/2018   Aortic valve stenosis 04/05/2015   Severe aortic stenosis    OSA on CPAP    History of prostate cancer    History of peptic ulcer    History of kidney stones    GERD    CAD (coronary artery disease), native coronary artery    Atrial fibrillation, persistent (  [place]) 09/19/2014   Arthritis    Bulbous urethral stricture 12/03/2019   Perirectal abscess 05/15/2020   Pyelonephritis of right kidney 05/15/2020   Presence of IVC filter 05/15/2020   Constipation, chronic 05/15/2020   Cysts of right kidney 05/15/2020   Renal abscess    PICC (peripherally inserted central catheter) in place 06/11/2020   Medication monitoring encounter 06/11/2020   Acute encephalopathy 08/29/2020   Encounter for care of pacemaker 02/14/2021   Ischemic cardiomyopathy 02/14/2021   Resolved Ambulatory Problems    Diagnosis Date Noted   DIZZINESS 09/28/2009   Preop cardiovascular exam 04/28/2011   Pulmonary embolism ([place]) 09/28/2011   Acute respiratory distress 10/03/2011   Hypoxia 10/03/2011   Acute renal failure ([place]) 10/04/2011   Encounter for therapeutic drug monitoring 03/24/2013   S/P drug eluting coronary stent placement Circumflex    Aortic stenosis    Atypical chest pain 09/19/2014   AKI (acute kidney injury) ([place]) 09/19/2014   Asthma, chronic 09/19/2014   Heart murmur 09/14/2011   Myocardial infarction ([place]) 09/14/2011   Injury of kidney 09/19/2014   History of infection of prosthetic knee 04/05/2015   Chronic kidney disease (CKD) 11/12/2017   Severe aortic stenosis 03/18/2018   Diarrhea 08/06/2020   Infectious diarrhea    ESBL (extended spectrum beta-lactamase) producing  bacteria infection    No Additional Past Medical History   SOCIAL HX:  Social History   Tobacco Use   Smoking status: Former    Packs/day: 1.00    Years: 10.00    Pack years: 10.00    Types: Cigarettes    Quit date: 04/27/1961    Years since quitting: 59.9   Smokeless tobacco: Never  Substance Use Topics   Alcohol use: No    Alcohol/week: 0.0 standard drinks     ALLERGIES:  Allergies  Allergen Reactions   Darifenacin Nausea Only and Other (See Comments)    dry mouth and dizziness ENABLEX    Codeine Other (See Comments)    Unknown reaction   Sulfamethoxazole-Trimethoprim Other (See Comments)    Caused major confusion   Lisinopril Cough     PERTINENT MEDICATIONS:  Outpatient Encounter Medications as of 03/15/2021  Medication Sig   acetaminophen (TYLENOL) 650 MG CR tablet Take 650 mg by mouth every 8 (eight) hours as needed for pain (headache).   carvedilol (COREG) 3.125 MG tablet Take 1 tablet (3.125 mg total) by mouth 2 (two) times daily.   cholecalciferol (VITAMIN D) 1000 UNITS tablet Take 1,000 Units by mouth daily.   clotrimazole (LOTRIMIN) 1 % cream Apply to affected area 2 times daily   diphenoxylate-atropine (LOMOTIL) 2.5-0.025 MG tablet Take 1 tablet by mouth 4 (four) times daily as needed for diarrhea or loose stools.   docusate sodium (COLACE) 100 MG capsule Take 100 mg by mouth 2 (two) times daily.   furosemide (LASIX) 20 MG tablet Take 1 tablet (20 mg total) by mouth daily as needed.   isosorbide mononitrate (IMDUR) 30 MG 24 hr tablet TAKE 1/2 TABLET ONCE DAILY   Multiple Vitamin (MULTIVITAMIN WITH MINERALS) TABS tablet Take 1 tablet by mouth daily.   nitroGLYCERIN (NITROSTAT) 0.4 MG SL tablet Place 1 tablet (0.4 mg total) under the tongue every 5 (five) minutes as needed.   omeprazole (PRILOSEC) 20 MG capsule Take 20 mg by mouth daily.   pravastatin (PRAVACHOL) 20 MG tablet TAKE 1 TABLET BY MOUTH EVERY DAY   PRESCRIPTION MEDICATION Inhale into the lungs at  bedtime. CPAP   Rivaroxaban (XARELTO) 15  MG TABS tablet Take 1 tablet (15 mg total) by mouth daily with supper.   vitamin B-12 (CYANOCOBALAMIN) 500 MCG tablet Take 500 mcg by mouth daily.   No facility-administered encounter medications on file as of 03/15/2021.   Thank you for the opportunity to participate in the care of [name].  The palliative care team will continue to follow. Please call our office at [phone] if we can be of additional assistance.   Hollace Kinnier, DO  COVID-19 PATIENT SCREENING TOOL Asked and negative response unless otherwise noted:  Have you had symptoms of covid, tested positive or been in contact with someone with symptoms/positive test in the past 5-10 days? no

## 2021-03-16 ENCOUNTER — Ambulatory Visit: Payer: Medicare Other | Admitting: Cardiology

## 2021-03-16 ENCOUNTER — Encounter: Payer: Self-pay | Admitting: Cardiology

## 2021-03-16 ENCOUNTER — Other Ambulatory Visit: Payer: Self-pay

## 2021-03-16 VITALS — BP 140/69 | HR 60 | Temp 97.2°F | Resp 14 | Ht 72.0 in | Wt 202.0 lb

## 2021-03-16 DIAGNOSIS — I5042 Chronic combined systolic (congestive) and diastolic (congestive) heart failure: Secondary | ICD-10-CM

## 2021-03-16 DIAGNOSIS — I4819 Other persistent atrial fibrillation: Secondary | ICD-10-CM

## 2021-03-16 DIAGNOSIS — G4733 Obstructive sleep apnea (adult) (pediatric): Secondary | ICD-10-CM | POA: Diagnosis not present

## 2021-03-16 DIAGNOSIS — Z952 Presence of prosthetic heart valve: Secondary | ICD-10-CM

## 2021-03-16 DIAGNOSIS — I25118 Atherosclerotic heart disease of native coronary artery with other forms of angina pectoris: Secondary | ICD-10-CM

## 2021-03-16 NOTE — Progress Notes (Signed)
Primary Physician/Referring:  Gayland Curry, DO  Patient ID: [name], male    DOB: [birth year], 88 y.o.   MRN: [id number]  Chief Complaint  Patient presents with   Coronary Artery Disease   Follow-up    3 months    HPI:    [name]  is a 88 y.o. Caucasian male patient with coronary artery disease, biventricular pacemaker implantation for  ischemic cardiomyopathy, permanent atrial fibrillation, severe aortic stenosis SP TAVR on 03/19/2018, deconditioning, persistent E. coli diarrhea, history of perinephric abscess, chronic renal insufficiency, previously in palliative care. He has not had any further hospitalizations in July 2022. 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  He is on Xarelto for both hypercoagulable state and permanent atrial fibrillation.  He presents for a 24-month office visit, presently doing well except complains of fatigue and excessive sweating at night and thinks it may be related to his BiPAP machine. His wife present.   Family History  Problem Relation Age of Onset   Melanoma Father    Melanoma Brother     Social History   Tobacco Use   Smoking status: Former    Packs/day: 1.00    Years: 10.00    Pack years: 10.00    Types: Cigarettes    Quit date: 04/27/1961    Years since quitting: 59.9   Smokeless tobacco: Never  Substance Use Topics   Alcohol use: No    Alcohol/week: 0.0 standard drinks   Marital Status: Married  ROS  Review of Systems  Cardiovascular:  Positive for dyspnea on exertion (mild and stable). Negative for chest pain and leg swelling.  Musculoskeletal:  Positive for arthritis.  Gastrointestinal:  Negative for melena.  Objective  Blood pressure 140/69, pulse 60, temperature (!) 97.2 F (36.2 C), temperature source Temporal, resp. rate 14, height 6' (1.829 m), weight 202 lb (91.6 kg), SpO2 100 %. Body mass index is 27.4 kg/m.  Vitals with BMI 03/16/2021 03/15/2021 12/08/2020   Height 6\' 0"  - 6\' 0"   Weight 202 lbs 200 lbs 206 lbs  BMI 47.82 - 95.62  Systolic 130 865 784  Diastolic 69 60 62  Pulse 60 68 49     Physical Exam Neck:     Vascular: No carotid bruit or JVD.  Cardiovascular:     Rate and Rhythm: Normal rate and regular rhythm.     Pulses: Intact distal pulses.          Popliteal pulses are 2+ on the right side and 2+ on the left side.       Dorsalis pedis pulses are 2+ on the right side and 1+ on the left side.       Posterior tibial pulses are 2+ on the right side and 1+ on the left side.     Heart sounds: Murmur heard.  Early systolic murmur is present with a grade of 2/6 at the upper right sternal border.    No gallop.  Pulmonary:     Effort: Pulmonary effort is normal.     Breath sounds: Normal breath sounds.  Musculoskeletal:     Right lower leg: No edema.     Left lower leg: No edema.    Laboratory examination:   Recent Labs    08/23/20 0439 08/25/20 0428 08/28/20 2100 12/09/20 1117  NA 137 140 138 141  K 3.3* 4.9 4.2 4.4  CL 110 109 103 106  CO2 23 27 26  20  GLUCOSE 122* 99 124* 90  BUN 19 17 10 18   CREATININE 0.90 1.08 1.41* 1.40*  CALCIUM 8.4* 9.0 9.2 10.0  GFRNONAA >60 >60 48*  --    CrCl cannot be calculated (Patient's most recent lab result is older than the maximum 21 days allowed.).  CMP Latest Ref Rng & Units 12/09/2020 08/28/2020 08/25/2020  Glucose 70 - 99 mg/dL 90 124(H) 99  BUN 8 - 27 mg/dL 18 10 17   Creatinine 0.76 - 1.27 mg/dL 1.40(H) 1.41(H) 1.08  Sodium 134 - 144 mmol/L 141 138 140  Potassium 3.5 - 5.2 mmol/L 4.4 4.2 4.9  Chloride 96 - 106 mmol/L 106 103 109  CO2 20 - 29 mmol/L 20 26 27   Calcium 8.6 - 10.2 mg/dL 10.0 9.2 9.0  Total Protein 6.0 - 8.5 g/dL 6.4 6.4(L) -  Total Bilirubin 0.0 - 1.2 mg/dL 0.5 1.1 -  Alkaline Phos 44 - 121 IU/L 97 177(H) -  AST 0 - 40 IU/L 15 37 -  ALT 0 - 44 IU/L 5 14 -   CBC Latest Ref Rng & Units 12/09/2020 08/28/2020 08/25/2020  WBC 3.4 - 10.8 x10E3/uL 6.5 6.8 5.9   Hemoglobin 13.0 - 17.7 g/dL 13.1 13.1 11.5(L)  Hematocrit 37.5 - 51.0 % 41.2 41.4 36.7(L)  Platelets 150 - 450 x10E3/uL 248 290 186   Lipid Panel Recent Labs    12/09/20 1117  CHOL 107  TRIG 74  LDLCALC 53  HDL 39*   Lipid Panel     Component Value Date/Time   CHOL 107 12/09/2020 1117   TRIG 74 12/09/2020 1117   HDL 39 (L) 12/09/2020 1117   CHOLHDL 3.7 07/14/2019 1343   CHOLHDL 3 04/28/2011 1003   VLDL 17.0 04/28/2011 1003   LDLCALC 53 12/09/2020 1117   LABVLDL 15 12/09/2020 1117     HEMOGLOBIN A1C Lab Results  Component Value Date   HGBA1C 6.3 (H) 03/18/2018   MPG 134.11 03/18/2018   TSH Recent Labs    12/09/20 1117  TSH 1.240   Medications and allergies   Allergies  Allergen Reactions   Darifenacin Nausea Only and Other (See Comments)    dry mouth and dizziness ENABLEX    Codeine Other (See Comments)    Unknown reaction   Sulfamethoxazole-Trimethoprim Other (See Comments)    Caused major confusion   Lisinopril Cough     Medication prior to this encounter:   Outpatient Medications Prior to Visit  Medication Sig Dispense Refill   acetaminophen (TYLENOL) 650 MG CR tablet Take 650 mg by mouth every 8 (eight) hours as needed for pain (headache).     carvedilol (COREG) 3.125 MG tablet Take 1 tablet (3.125 mg total) by mouth 2 (two) times daily. 180 tablet 3   cholecalciferol (VITAMIN D) 1000 UNITS tablet Take 1,000 Units by mouth daily.     furosemide (LASIX) 20 MG tablet Take 1 tablet (20 mg total) by mouth daily as needed. (Patient taking differently: Take 20 mg by mouth every other day.) 90 tablet 1   isosorbide mononitrate (IMDUR) 30 MG 24 hr tablet TAKE 1/2 TABLET ONCE DAILY 45 tablet 2   Multiple Vitamin (MULTIVITAMIN WITH MINERALS) TABS tablet Take 1 tablet by mouth daily.     nitroGLYCERIN (NITROSTAT) 0.4 MG SL tablet Place 1 tablet (0.4 mg total) under the tongue every 5 (five) minutes as needed. 25 tablet 3   pravastatin (PRAVACHOL) 20 MG tablet  TAKE 1 TABLET BY MOUTH EVERY DAY 90 tablet 3  PRESCRIPTION MEDICATION Inhale into the lungs at bedtime. CPAP     Rivaroxaban (XARELTO) 15 MG TABS tablet Take 1 tablet (15 mg total) by mouth daily with supper. 30 tablet 3   vitamin B-12 (CYANOCOBALAMIN) 500 MCG tablet Take 500 mcg by mouth daily.     clotrimazole (LOTRIMIN) 1 % cream Apply to affected area 2 times daily 60 g 0   diphenoxylate-atropine (LOMOTIL) 2.5-0.025 MG tablet Take 1 tablet by mouth 4 (four) times daily as needed for diarrhea or loose stools. 30 tablet 0   docusate sodium (COLACE) 100 MG capsule Take 100 mg by mouth 2 (two) times daily.     omeprazole (PRILOSEC) 20 MG capsule Take 20 mg by mouth daily.     No facility-administered medications prior to visit.    Medication list after today's encounter   Current Outpatient Medications  Medication Instructions   acetaminophen (TYLENOL) 650 mg, Oral, Every 8 hours PRN   carvedilol (COREG) 3.125 mg, Oral, 2 times daily   cholecalciferol (VITAMIN D) 1,000 Units, Oral, Daily   furosemide (LASIX) 20 mg, Oral, Daily PRN   isosorbide mononitrate (IMDUR) 30 MG 24 hr tablet TAKE 1/2 TABLET ONCE DAILY   Multiple Vitamin (MULTIVITAMIN WITH MINERALS) TABS tablet 1 tablet, Oral, Daily   nitroGLYCERIN (NITROSTAT) 0.4 mg, Sublingual, Every 5 min PRN   pravastatin (PRAVACHOL) 20 MG tablet TAKE 1 TABLET BY MOUTH EVERY DAY   PRESCRIPTION MEDICATION Inhalation, Daily at bedtime, CPAP   Rivaroxaban (XARELTO) 15 mg, Oral, Daily with supper   vitamin B-12 (CYANOCOBALAMIN) 500 mcg, Oral, Daily   Radiology:   No results found.  Cardiac Studies:    Coronary angiogram 09/12/2019 (H/O PCI to RCA and Cx 2017): 1. Status post TAVR 2020 with no significant transaortic valve gradient measured by pullback 2. Status post RCA stenting with continued patency of the stents in the mid vessel, mild in-stent restenosis, and moderate distal vessel disease unchanged from the previous study 3. Status  post stenting of the left circumflex with continued stent patency and mild in-stent restenosis, severe subbranch stenosis in the distal OM appropriate for medical therapy as this is a medium caliber distal branch vessel with stable disease compared with previous cath 4. Mild nonobstructive LAD stenosis with no flow-limiting lesions identified  TAVR (29 mm Edwards Sapien 3, 03/19/2018).  Echocardiogram 03/18/2020:   1. 1 year post TAVR, normal transaortic peak/mean gradients 18/9 mmHg, no paravalvular leak.  2. Left ventricular ejection fraction, by estimation, is 50 to 55%. The left ventricle has low normal function. The left ventrical has no regional wall motion abnormalities. Left ventricular diastolic function could not be evaluated.  3. Right ventricular systolic function is normal. The right ventricular size is normal. There is normal pulmonary artery systolic pressure.  4. Left atrial size was moderately dilated. No left atrial/left atrial appendage thrombus was detected.  5. Right atrial size was mildly dilated.  6. The mitral valve is normal in structure and function. no evidence of mitral valve regurgitation. No evidence of mitral stenosis.  7. No paravalvular leak.. The aortic valve has been repaired/replaced. Aortic valve regurgitation is not visualized. No aortic stenosis is present. 64mm valve is present in the aortic position. Echo findings show normal structure and function of the  aortic prosthesis.  8. The inferior vena cava is normal in size with greater than 50% respiratory variability, suggesting right atrial pressure of 3 mmHg.   Cardiac pacemaker in situ 03/14/2017  Biventricular [employer] inserted 03/14/17 Dr. Lovena Le  for second degree heart block  Remote CRT-P transmission 02/14/2021: Pacemaker in DDDR mode. AP 1%, CRT 96%. Longevity 2 years. Lead impedance and threshold within normal limits. Patient in permanent atrial fibrillation. There were no high ventricular rate episodes.  Thoracic impedance does not suggest volume overload state. Normal CRT-P function.  EKG:   EKG 12/08/2020: Underlying atrial fibrillation with ventricularly paced rhythm at the rate of 60 bpm, no further analysis.  Biventricular pacemaker detected.      Assessment     ICD-10-CM   1. Atrial fibrillation, persistent ([place])  I48.19     2. Atherosclerosis of native coronary artery of native heart with stable angina pectoris ([place])  I25.118     3. Chronic combined systolic and diastolic heart failure ([facility])  I50.42     4. S/P TAVR (transcatheter aortic valve replacement) 2020  Z95.2     5. OSA treated with BiPAP  G47.33        Recommendations:   [name] is a 88 y.o. Caucasian male patient with coronary artery disease, biventricular pacemaker implantation for  ischemic cardiomyopathy, permanent atrial fibrillation, severe aortic stenosis SP TAVR on 03/19/2018, deconditioning, persistent E. coli diarrhea, history of perinephric abscess, chronic renal insufficiency, previously in palliative care. He has not had any further hospitalizations in July 2022. 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  He is on Xarelto for both hypercoagulable state and permanent atrial fibrillation.  He presents for a 30-month office visit, presently doing well except complains of fatigue and excessive sweating at night and thinks it may be related to his BiPAP machine.  After discussions with the patient, it appears that he did better with old machine and the new machine he has presently, he may need repeat sleep study.  BP Is controlled, lipids are are goal. No clinical evidence of CHF.   Today he has returned back to complete physical activity that he normally does at home without any limitations. I have discussed 10 Lbs weight loss. No change in aortic murmur. No acute decompensated CHF. Continue present medications and I will see him  back in 6 months.     Adrian Prows, MD, [facility] 03/16/2021, 10:28 PM Office: [phone]

## 2021-03-17 ENCOUNTER — Telehealth: Payer: Self-pay | Admitting: *Deleted

## 2021-03-17 NOTE — Telephone Encounter (Signed)
Received a communication from Dr. Hollace Kinnier requesting that I send an order to [facility] for a stat portable 2-view chest x-ray. He has been experiencing a cough, night sweats, increased weakness and malaise. Orders faxed as requested and confirmation email received.

## 2021-03-18 DIAGNOSIS — J439 Emphysema, unspecified: Secondary | ICD-10-CM | POA: Diagnosis not present

## 2021-03-21 ENCOUNTER — Telehealth: Payer: Self-pay | Admitting: *Deleted

## 2021-03-21 NOTE — Telephone Encounter (Signed)
3:22pm  Received a communication from Dr. Hollace Kinnier requesting that I draw a Hgb A1C on patient this week. He continues to decline with poor intake, nightsweats, fatigue and increased confusion. He had a family member to come over and check his blood sugars recently and they were elevated. Dr. Mariea Clonts would like to make sure that we are not missing new diabetes diagnosis before going the full comfort route. I called and left a voicemail with patient's wife, [name] to schedule lab draw. Awaiting return call to schedule.

## 2021-03-22 ENCOUNTER — Telehealth: Payer: Self-pay | Admitting: *Deleted

## 2021-03-22 NOTE — Telephone Encounter (Signed)
Called patient's wife again today to schedule a palliative care RN visit to draw Hgb A1C as requested by Dr. Mariea Clonts. Visit scheduled for tomorrow 03/23/21 @11a .

## 2021-03-23 ENCOUNTER — Other Ambulatory Visit: Payer: Self-pay

## 2021-03-23 ENCOUNTER — Other Ambulatory Visit: Payer: Medicare Other | Admitting: *Deleted

## 2021-03-23 DIAGNOSIS — Z515 Encounter for palliative care: Secondary | ICD-10-CM

## 2021-03-24 ENCOUNTER — Other Ambulatory Visit: Payer: Medicare Other | Admitting: *Deleted

## 2021-03-24 ENCOUNTER — Other Ambulatory Visit: Payer: Self-pay

## 2021-03-24 DIAGNOSIS — Z515 Encounter for palliative care: Secondary | ICD-10-CM

## 2021-03-24 NOTE — Progress Notes (Signed)
[facility] COMMUNITY PALLIATIVE CARE RN NOTE  PATIENT NAME: GARDNER SERVANTES DOB: 1931/12/10 MRN: [id number]  PRIMARY CARE PROVIDER: Gayland Curry, DO  RESPONSIBLE PARTY: [name] Cundy (wife) Acct ID - Guarantor Home Phone Work Phone Relationship Acct Type  [id number] CLEOTHA, TSANG* [phone]  Self P/F     [address], [place] [address]   Covid-19 Pre-screening Negative  RN visit made to draw Hgb A1C as ordered by Dr. Mariea Clonts. Attempted in left antecubital and was unsuccessful. Patient's veins were very flat. He had bad experiences in the past and did not want to be stuck multiple times as he is a difficult stick. He has been steadily declining. He denies pain, but did have some dyspnea while simply sitting in his chair and trying to converse with me. [name] states that he continues with night sweats. They were pretty bad last night and she had to change patient's shirt 5 times during the night. He is sleeping more overall with less food/fluid intake. Update provided to Dr. Mariea Clonts. Wife would like to try patient on an antibiotic. Dr. Mariea Clonts prescribed Azithromycin 250 mg tabs, 2 tablets today then 1 tablet daily for the next 4 days but is not sure that this will make much difference in patient's condition. She feels patient may be approaching hospice, and from my assessment today I agree.  I called this medication in to patient's pharmacy [place] and made [name] aware. Palliative care will continue to follow.    (Duration of visit and documentation 45 minutes)   Daryl Eastern, RN BSN

## 2021-03-24 NOTE — Progress Notes (Addendum)
[facility] COMMUNITY PALLIATIVE CARE RN NOTE  PATIENT NAME: [name] DOB: 10-27-1931 MRN: [id number]  PRIMARY CARE PROVIDER: Gayland Curry, DO  RESPONSIBLE PARTY: [name] (wife) Acct ID - Guarantor Home Phone Work Phone Relationship Acct Type  [id number] CHAEL, [phone]  Self P/F     [place], [place], [place] [address]   Due to the COVID-19 crisis, this virtual check-in visit was done via telephone from my office and it was initiated and consent by this patient and or family.  RN follow up telephonic encounter completed with patient's wife Noah. She reports that patient was able to take a shower today, however she had to assist him out of the shower. Afterwards, he fell asleep and has been sleeping ever since. He is progressively getting weaker and it is getting more difficult for Noah to care for him. He ambulates using a walker, but becomes short of breath with any exertion, even talking. He continues with night sweats. I advised that Dr. Mariea Clonts did not feel that we should worry about re-attempting the Hgb A1C lab draw and Noah agrees. She also speaks of patient's increasing overall confusion and him not wanting her to leave his side for any reason. He is sleeping more overall. She states that patient only wants to be kept comfortable and does not desire intubation or hospitalizations. He wants to remain in his home. In order to do so, wife feels that she needs more support. She was very tearful in speaking about this. She states that Dr. Mariea Clonts feels that patient's renal abscess has returned or has never completely resolved. He started on Azithromycin yesterday. I discussed hospice services with her and she would like to have a hospice consult. I reached out to Dr. Mariea Clonts who gave the verbal order for a hospice consult and that she agrees to be the attending. This information was sent to [facility]'s referral center. Hospice admission visit scheduled for tomorrow 03/25/21  at 1:00pm.   (Duration of visit and documentation 20 minutes)   Daryl Eastern, RN BSN

## 2021-03-25 DIAGNOSIS — I130 Hypertensive heart and chronic kidney disease with heart failure and stage 1 through stage 4 chronic kidney disease, or unspecified chronic kidney disease: Secondary | ICD-10-CM | POA: Diagnosis not present

## 2021-03-25 DIAGNOSIS — I4891 Unspecified atrial fibrillation: Secondary | ICD-10-CM | POA: Diagnosis not present

## 2021-03-25 DIAGNOSIS — I350 Nonrheumatic aortic (valve) stenosis: Secondary | ICD-10-CM | POA: Diagnosis not present

## 2021-03-25 DIAGNOSIS — I5042 Chronic combined systolic (congestive) and diastolic (congestive) heart failure: Secondary | ICD-10-CM | POA: Diagnosis not present

## 2021-03-25 DIAGNOSIS — K219 Gastro-esophageal reflux disease without esophagitis: Secondary | ICD-10-CM | POA: Diagnosis not present

## 2021-03-25 DIAGNOSIS — I2510 Atherosclerotic heart disease of native coronary artery without angina pectoris: Secondary | ICD-10-CM | POA: Diagnosis not present

## 2021-03-25 DIAGNOSIS — Z87448 Personal history of other diseases of urinary system: Secondary | ICD-10-CM | POA: Diagnosis not present

## 2021-03-25 DIAGNOSIS — Z95810 Presence of automatic (implantable) cardiac defibrillator: Secondary | ICD-10-CM | POA: Diagnosis not present

## 2021-03-25 DIAGNOSIS — N189 Chronic kidney disease, unspecified: Secondary | ICD-10-CM | POA: Diagnosis not present

## 2021-03-25 DIAGNOSIS — Z8546 Personal history of malignant neoplasm of prostate: Secondary | ICD-10-CM | POA: Diagnosis not present

## 2021-03-28 DIAGNOSIS — I130 Hypertensive heart and chronic kidney disease with heart failure and stage 1 through stage 4 chronic kidney disease, or unspecified chronic kidney disease: Secondary | ICD-10-CM | POA: Diagnosis not present

## 2021-03-28 DIAGNOSIS — N189 Chronic kidney disease, unspecified: Secondary | ICD-10-CM | POA: Diagnosis not present

## 2021-03-28 DIAGNOSIS — I4891 Unspecified atrial fibrillation: Secondary | ICD-10-CM | POA: Diagnosis not present

## 2021-03-28 DIAGNOSIS — I2510 Atherosclerotic heart disease of native coronary artery without angina pectoris: Secondary | ICD-10-CM | POA: Diagnosis not present

## 2021-03-28 DIAGNOSIS — I350 Nonrheumatic aortic (valve) stenosis: Secondary | ICD-10-CM | POA: Diagnosis not present

## 2021-03-28 DIAGNOSIS — I5042 Chronic combined systolic (congestive) and diastolic (congestive) heart failure: Secondary | ICD-10-CM | POA: Diagnosis not present

## 2021-03-29 ENCOUNTER — Telehealth: Payer: Self-pay

## 2021-03-29 DIAGNOSIS — N189 Chronic kidney disease, unspecified: Secondary | ICD-10-CM | POA: Diagnosis not present

## 2021-03-29 DIAGNOSIS — I130 Hypertensive heart and chronic kidney disease with heart failure and stage 1 through stage 4 chronic kidney disease, or unspecified chronic kidney disease: Secondary | ICD-10-CM | POA: Diagnosis not present

## 2021-03-29 DIAGNOSIS — I4891 Unspecified atrial fibrillation: Secondary | ICD-10-CM | POA: Diagnosis not present

## 2021-03-29 DIAGNOSIS — I5042 Chronic combined systolic (congestive) and diastolic (congestive) heart failure: Secondary | ICD-10-CM | POA: Diagnosis not present

## 2021-03-29 DIAGNOSIS — I2510 Atherosclerotic heart disease of native coronary artery without angina pectoris: Secondary | ICD-10-CM | POA: Diagnosis not present

## 2021-03-29 DIAGNOSIS — I350 Nonrheumatic aortic (valve) stenosis: Secondary | ICD-10-CM | POA: Diagnosis not present

## 2021-03-29 NOTE — Telephone Encounter (Signed)
The patient wife left a message about a letter she received. After doing some digging I saw that the patient was released to Dr. Einar Gip office. I cancelled all upcoming remotes appointments. LMOVM for patient wife to return my call.

## 2021-03-30 DIAGNOSIS — I130 Hypertensive heart and chronic kidney disease with heart failure and stage 1 through stage 4 chronic kidney disease, or unspecified chronic kidney disease: Secondary | ICD-10-CM | POA: Diagnosis not present

## 2021-03-30 DIAGNOSIS — I4891 Unspecified atrial fibrillation: Secondary | ICD-10-CM | POA: Diagnosis not present

## 2021-03-30 DIAGNOSIS — I350 Nonrheumatic aortic (valve) stenosis: Secondary | ICD-10-CM | POA: Diagnosis not present

## 2021-03-30 DIAGNOSIS — N189 Chronic kidney disease, unspecified: Secondary | ICD-10-CM | POA: Diagnosis not present

## 2021-03-30 DIAGNOSIS — I5042 Chronic combined systolic (congestive) and diastolic (congestive) heart failure: Secondary | ICD-10-CM | POA: Diagnosis not present

## 2021-03-30 DIAGNOSIS — I2510 Atherosclerotic heart disease of native coronary artery without angina pectoris: Secondary | ICD-10-CM | POA: Diagnosis not present

## 2021-03-30 NOTE — Telephone Encounter (Signed)
I spoke with the patient wife and let her know she can discard the letter. I also told her I took him off our remote schedule. She thanked me for the call.

## 2021-04-01 DIAGNOSIS — N189 Chronic kidney disease, unspecified: Secondary | ICD-10-CM | POA: Diagnosis not present

## 2021-04-01 DIAGNOSIS — I130 Hypertensive heart and chronic kidney disease with heart failure and stage 1 through stage 4 chronic kidney disease, or unspecified chronic kidney disease: Secondary | ICD-10-CM | POA: Diagnosis not present

## 2021-04-01 DIAGNOSIS — I350 Nonrheumatic aortic (valve) stenosis: Secondary | ICD-10-CM | POA: Diagnosis not present

## 2021-04-01 DIAGNOSIS — I5042 Chronic combined systolic (congestive) and diastolic (congestive) heart failure: Secondary | ICD-10-CM | POA: Diagnosis not present

## 2021-04-01 DIAGNOSIS — I4891 Unspecified atrial fibrillation: Secondary | ICD-10-CM | POA: Diagnosis not present

## 2021-04-01 DIAGNOSIS — I2510 Atherosclerotic heart disease of native coronary artery without angina pectoris: Secondary | ICD-10-CM | POA: Diagnosis not present

## 2021-04-04 DIAGNOSIS — I350 Nonrheumatic aortic (valve) stenosis: Secondary | ICD-10-CM | POA: Diagnosis not present

## 2021-04-04 DIAGNOSIS — I130 Hypertensive heart and chronic kidney disease with heart failure and stage 1 through stage 4 chronic kidney disease, or unspecified chronic kidney disease: Secondary | ICD-10-CM | POA: Diagnosis not present

## 2021-04-04 DIAGNOSIS — N189 Chronic kidney disease, unspecified: Secondary | ICD-10-CM | POA: Diagnosis not present

## 2021-04-04 DIAGNOSIS — I5042 Chronic combined systolic (congestive) and diastolic (congestive) heart failure: Secondary | ICD-10-CM | POA: Diagnosis not present

## 2021-04-04 DIAGNOSIS — I2510 Atherosclerotic heart disease of native coronary artery without angina pectoris: Secondary | ICD-10-CM | POA: Diagnosis not present

## 2021-04-04 DIAGNOSIS — I4891 Unspecified atrial fibrillation: Secondary | ICD-10-CM | POA: Diagnosis not present

## 2021-04-05 DIAGNOSIS — I130 Hypertensive heart and chronic kidney disease with heart failure and stage 1 through stage 4 chronic kidney disease, or unspecified chronic kidney disease: Secondary | ICD-10-CM | POA: Diagnosis not present

## 2021-04-05 DIAGNOSIS — I2510 Atherosclerotic heart disease of native coronary artery without angina pectoris: Secondary | ICD-10-CM | POA: Diagnosis not present

## 2021-04-05 DIAGNOSIS — I4891 Unspecified atrial fibrillation: Secondary | ICD-10-CM | POA: Diagnosis not present

## 2021-04-05 DIAGNOSIS — I5042 Chronic combined systolic (congestive) and diastolic (congestive) heart failure: Secondary | ICD-10-CM | POA: Diagnosis not present

## 2021-04-05 DIAGNOSIS — N189 Chronic kidney disease, unspecified: Secondary | ICD-10-CM | POA: Diagnosis not present

## 2021-04-05 DIAGNOSIS — I350 Nonrheumatic aortic (valve) stenosis: Secondary | ICD-10-CM | POA: Diagnosis not present

## 2021-04-06 DEATH — deceased

## 2021-04-08 ENCOUNTER — Other Ambulatory Visit: Payer: Self-pay | Admitting: Cardiology

## 2021-04-08 DIAGNOSIS — I4821 Permanent atrial fibrillation: Secondary | ICD-10-CM

## 2021-05-16 DIAGNOSIS — I255 Ischemic cardiomyopathy: Secondary | ICD-10-CM | POA: Diagnosis not present

## 2021-05-16 DIAGNOSIS — I5042 Chronic combined systolic (congestive) and diastolic (congestive) heart failure: Secondary | ICD-10-CM | POA: Diagnosis not present

## 2021-05-16 DIAGNOSIS — Z45018 Encounter for adjustment and management of other part of cardiac pacemaker: Secondary | ICD-10-CM | POA: Diagnosis not present

## 2021-09-14 ENCOUNTER — Ambulatory Visit: Payer: Federal, State, Local not specified - PPO | Admitting: Cardiology

## 2022-10-21 IMAGING — DX DG CHEST 1V PORT
1 series · 1 of 1 positions shown · non-contrast
Comparison: 08/24/2019

CLINICAL DATA: Fever, sepsis

EXAM:
PORTABLE CHEST 1 VIEW

[chest ap]
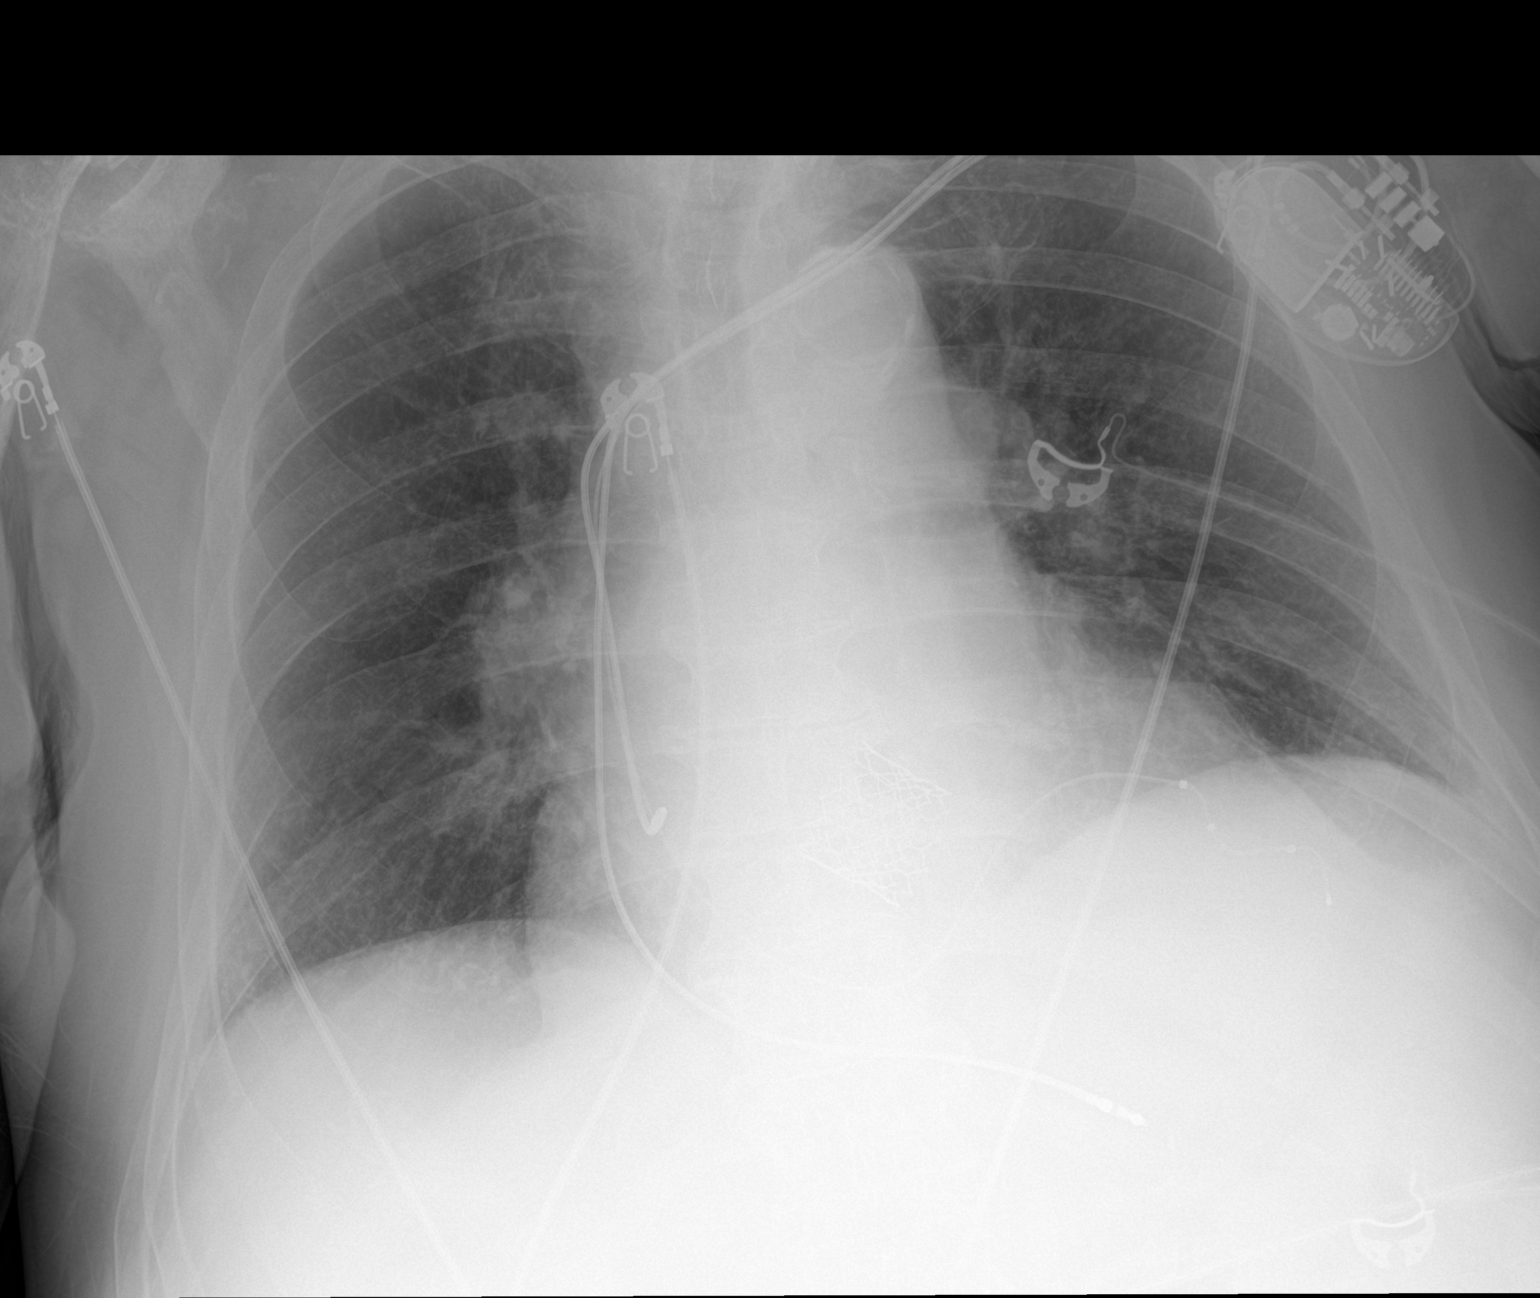

[1 of 1 positions shown; findings below may reference images not displayed]

FINDINGS: Single frontal view of the chest demonstrates a stable cardiac
silhouette. Multi lead pacer and aortic valve prosthesis unchanged.
No airspace disease, effusion, or pneumothorax.
IMPRESSION: 1. No acute intrathoracic process.  Stable exam.

## 2023-01-26 IMAGING — US IR PICC >5YO
1 series · 3 of 3 positions shown · IV contrast (agent unspecified)
Comparison: none

INDICATION: History of bacteremia. Patient requiring long-term durable IV
access. Previously placed PICC line as become dysfunctional and
concern for infection at skin site. This PICC line was removed.
Request for new PICC line placement

EXAM:
ULTRASOUND AND FLUOROSCOPIC GUIDED LEFT UPPER EXTREMITY PICC LINE
INSERTION
MEDICATIONS:
1% plain lidocaine, 1 mL
CONTRAST:  None
FLUOROSCOPY TIME:  12 seconds
COMPLICATIONS:
None immediate.
TECHNIQUE: The procedure, risks, benefits, and alternatives were explained to
the patient and informed written consent was obtained. A timeout was
performed prior to the initiation of the procedure.

[Series 1: ir picc >5yo · 3 of 3 slices shown]
[im 1/3]
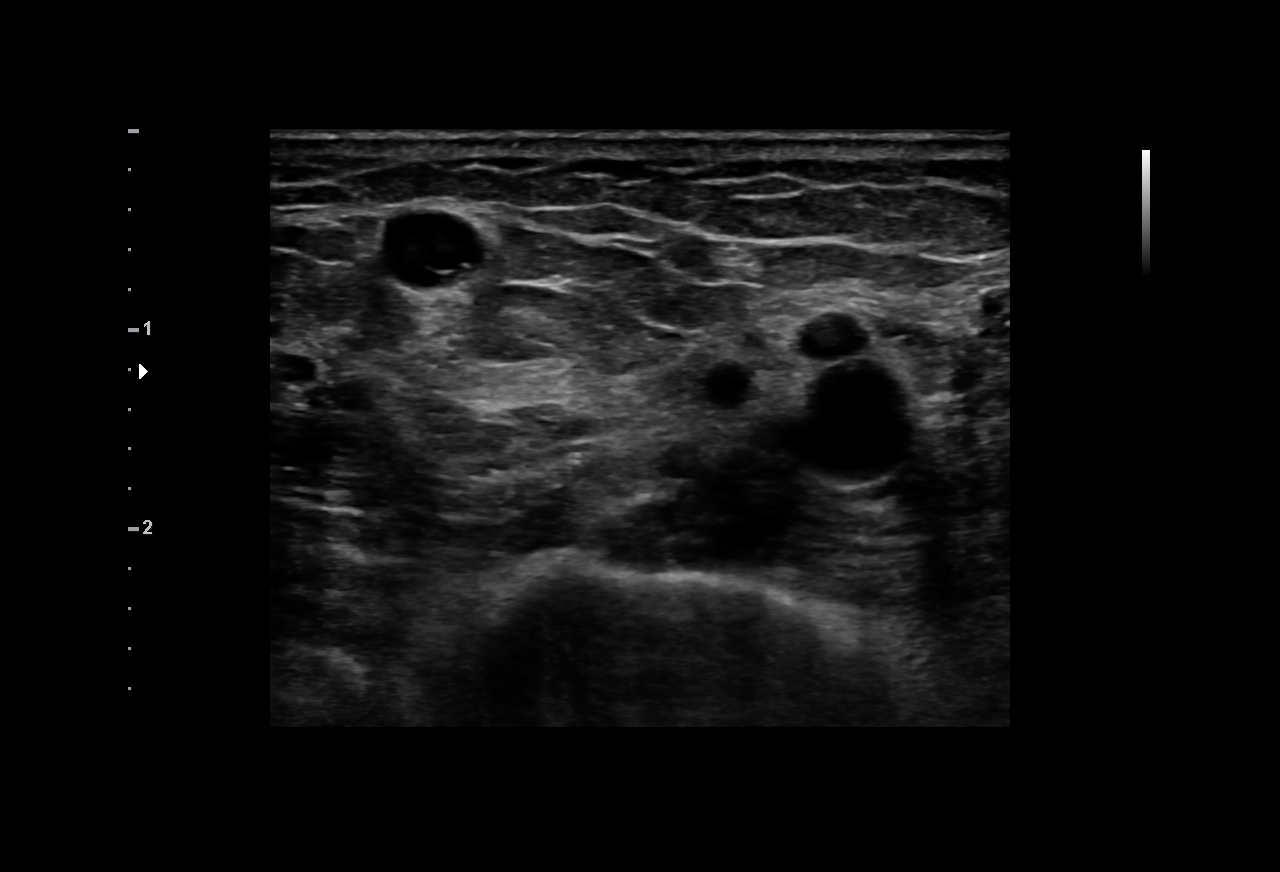
[im 2/3]
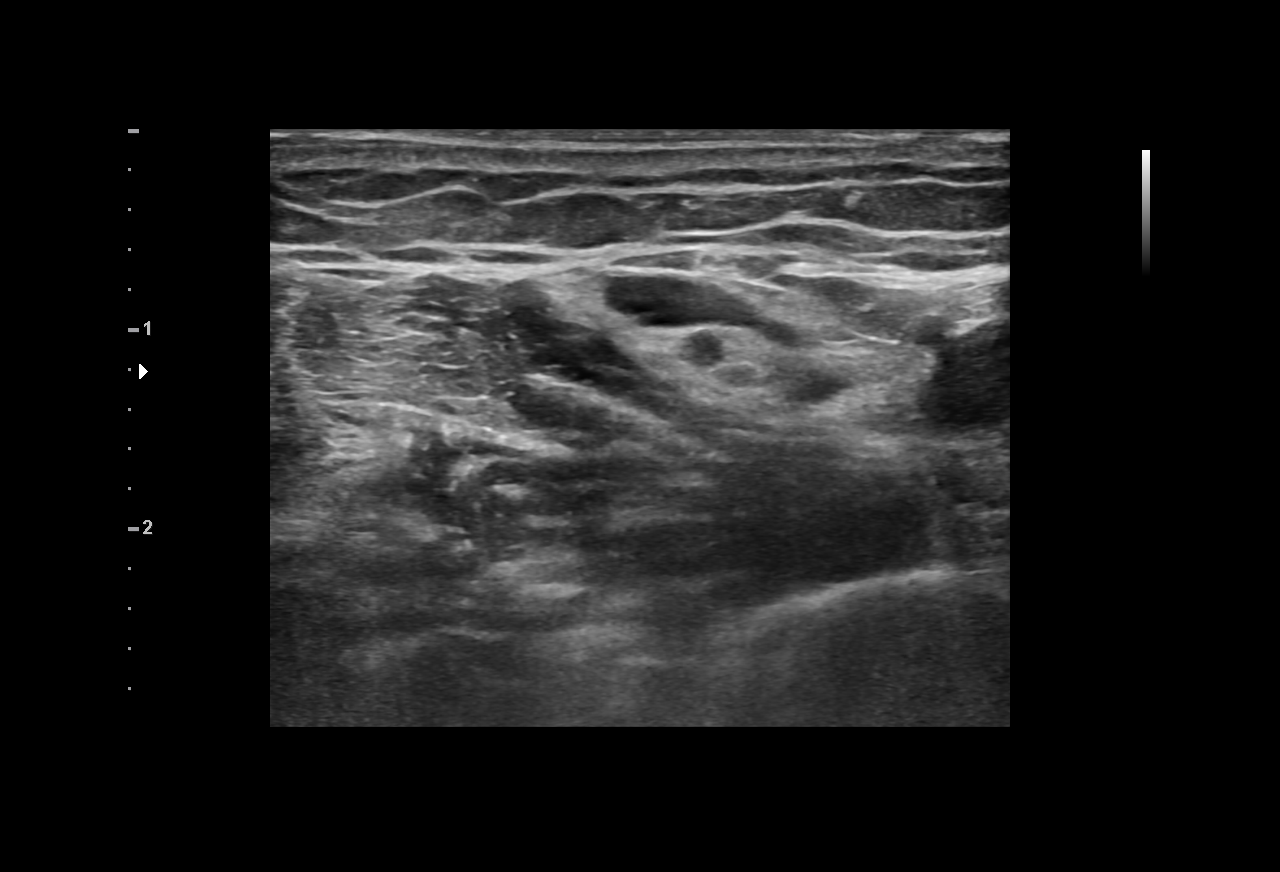
[im 3/3]
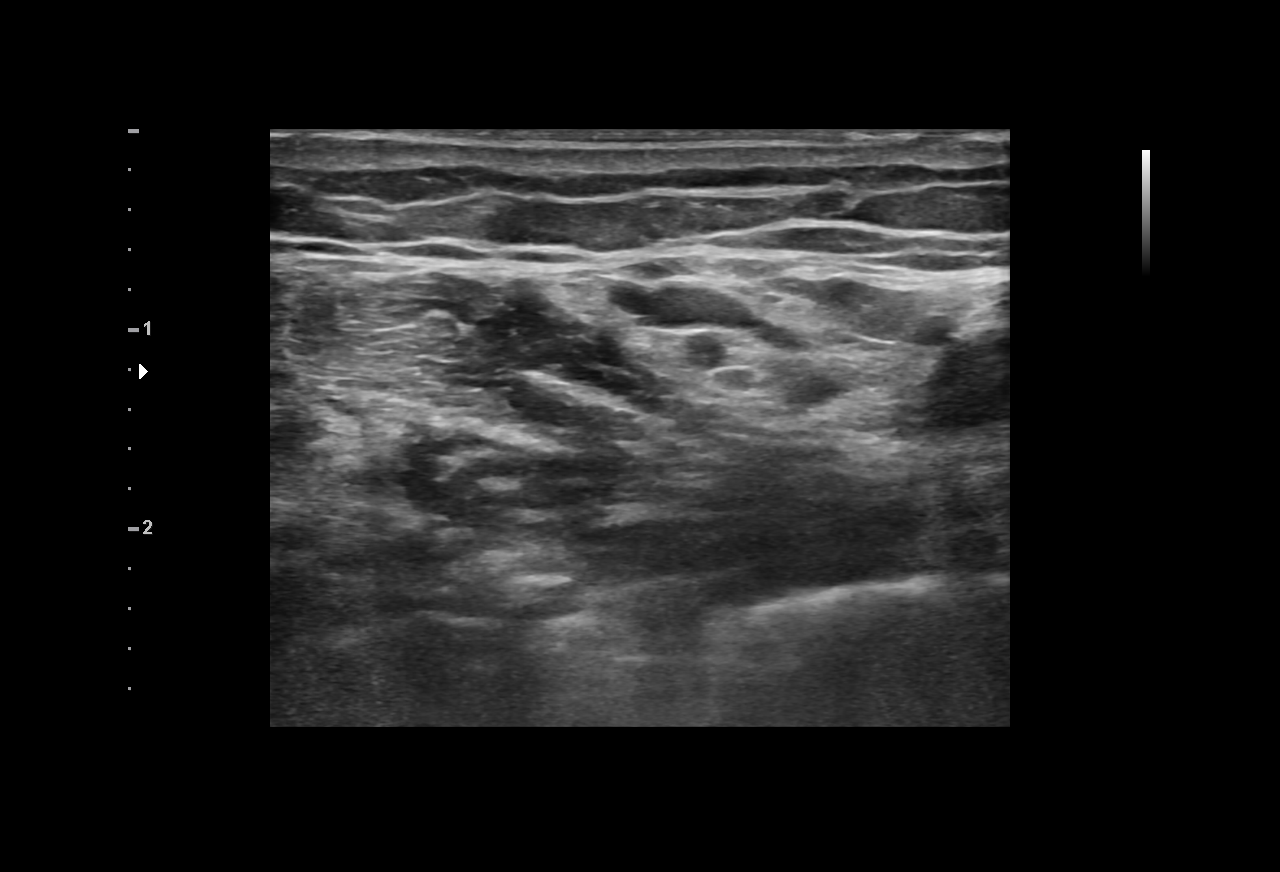

[3 of 3 positions shown; findings below may reference images not displayed]

The left upper extremity was prepped with chlorhexidine in a sterile
fashion, and a sterile drape was applied covering the operative
field. Maximum barrier sterile technique with sterile gowns and
gloves were used for the procedure. A timeout was performed prior to
the initiation of the procedure. Local anesthesia was provided with
1% lidocaine.

Under direct ultrasound guidance, the basilic vein was accessed with
a micropuncture kit after the overlying soft tissues were
anesthetized with 1% lidocaine.

After the overlying soft tissues were anesthetized, a small venotomy
incision was created and a micropuncture kit was utilized to access
the left basilic vein. Real-time ultrasound guidance was utilized
for vascular access including the acquisition of a permanent
ultrasound image documenting patency of the accessed vessel.

A guidewire was advanced to the level of the superior caval-atrial
junction for measurement purposes and the PICC line was cut to
length. A peel-away sheath was placed and a 45 cm, 5 French, single
lumen was inserted to level of the superior caval-atrial junction. A
post procedure spot fluoroscopic was obtained. The catheter easily
aspirated and flushed and was secured in place. A dressing was
placed. The patient tolerated the procedure well without immediate
post procedural complication.
FINDINGS: After catheter placement, the tip lies within the superior
cavoatrial junction. The catheter aspirates and flushes normally and
is ready for immediate use.
IMPRESSION: Successful ultrasound and fluoroscopic guided placement of a left
basilic vein approach, 45 cm, 5 French, single lumen PICC with tip
at the superior caval-atrial junction. The PICC line is ready for
immediate use.
